# Patient Record
Sex: Male | Born: 1950
Health system: Southern US, Community
[De-identification: ages and names within clinical notes are randomized; demographics above are authoritative.]

## PROBLEM LIST (undated history)

## (undated) DIAGNOSIS — I340 Nonrheumatic mitral (valve) insufficiency: Secondary | ICD-10-CM

## (undated) DIAGNOSIS — F419 Anxiety disorder, unspecified: Secondary | ICD-10-CM

## (undated) DIAGNOSIS — R0600 Dyspnea, unspecified: Secondary | ICD-10-CM

## (undated) DIAGNOSIS — R7301 Impaired fasting glucose: Secondary | ICD-10-CM

## (undated) DIAGNOSIS — I2510 Atherosclerotic heart disease of native coronary artery without angina pectoris: Secondary | ICD-10-CM

## (undated) DIAGNOSIS — E119 Type 2 diabetes mellitus without complications: Secondary | ICD-10-CM

## (undated) DIAGNOSIS — M25532 Pain in left wrist: Secondary | ICD-10-CM

## (undated) DIAGNOSIS — G4733 Obstructive sleep apnea (adult) (pediatric): Secondary | ICD-10-CM

## (undated) DIAGNOSIS — H269 Unspecified cataract: Secondary | ICD-10-CM

## (undated) DIAGNOSIS — R011 Cardiac murmur, unspecified: Secondary | ICD-10-CM

## (undated) DIAGNOSIS — M4800 Spinal stenosis, site unspecified: Secondary | ICD-10-CM

## (undated) DIAGNOSIS — Z951 Presence of aortocoronary bypass graft: Secondary | ICD-10-CM

## (undated) DIAGNOSIS — D471 Chronic myeloproliferative disease: Secondary | ICD-10-CM

## (undated) DIAGNOSIS — G4730 Sleep apnea, unspecified: Secondary | ICD-10-CM

## (undated) DIAGNOSIS — G8929 Other chronic pain: Secondary | ICD-10-CM

## (undated) DIAGNOSIS — I10 Essential (primary) hypertension: Secondary | ICD-10-CM

## (undated) DIAGNOSIS — E785 Hyperlipidemia, unspecified: Secondary | ICD-10-CM

## (undated) DIAGNOSIS — Z9889 Other specified postprocedural states: Secondary | ICD-10-CM

## (undated) DIAGNOSIS — N189 Chronic kidney disease, unspecified: Secondary | ICD-10-CM

## (undated) DIAGNOSIS — M545 Low back pain, unspecified: Secondary | ICD-10-CM

## (undated) DIAGNOSIS — R7611 Nonspecific reaction to tuberculin skin test without active tuberculosis: Secondary | ICD-10-CM

## (undated) DIAGNOSIS — Z9989 Dependence on other enabling machines and devices: Secondary | ICD-10-CM

## (undated) DIAGNOSIS — F329 Major depressive disorder, single episode, unspecified: Secondary | ICD-10-CM

## (undated) DIAGNOSIS — K635 Polyp of colon: Secondary | ICD-10-CM

## (undated) DIAGNOSIS — K219 Gastro-esophageal reflux disease without esophagitis: Secondary | ICD-10-CM

## (undated) DIAGNOSIS — E669 Obesity, unspecified: Secondary | ICD-10-CM

## (undated) DIAGNOSIS — J189 Pneumonia, unspecified organism: Secondary | ICD-10-CM

## (undated) DIAGNOSIS — C3491 Malignant neoplasm of unspecified part of right bronchus or lung: Secondary | ICD-10-CM

## (undated) DIAGNOSIS — Z125 Encounter for screening for malignant neoplasm of prostate: Secondary | ICD-10-CM

## (undated) DIAGNOSIS — Z0000 Encounter for general adult medical examination without abnormal findings: Secondary | ICD-10-CM

## (undated) DIAGNOSIS — I739 Peripheral vascular disease, unspecified: Secondary | ICD-10-CM

## (undated) DIAGNOSIS — F3289 Other specified depressive episodes: Secondary | ICD-10-CM

## (undated) DIAGNOSIS — M1990 Unspecified osteoarthritis, unspecified site: Secondary | ICD-10-CM

## (undated) DIAGNOSIS — I5032 Chronic diastolic (congestive) heart failure: Secondary | ICD-10-CM

## (undated) HISTORY — DX: Obesity, unspecified: E66.9

## (undated) HISTORY — DX: Pain in left wrist: M25.532

## (undated) HISTORY — DX: Other specified depressive episodes: F32.89

## (undated) HISTORY — DX: Malignant neoplasm of unspecified part of right bronchus or lung: C34.91

## (undated) HISTORY — DX: Impaired fasting glucose: R73.01

## (undated) HISTORY — DX: Type 2 diabetes mellitus without complications: E11.9

## (undated) HISTORY — PX: COLONOSCOPY W/ POLYPECTOMY: SHX1380

## (undated) HISTORY — DX: Spinal stenosis, site unspecified: M48.00

## (undated) HISTORY — DX: Nonspecific reaction to tuberculin skin test without active tuberculosis: R76.11

## (undated) HISTORY — DX: Chronic kidney disease, unspecified: N18.9

## (undated) HISTORY — DX: Major depressive disorder, single episode, unspecified: F32.9

## (undated) HISTORY — DX: Chronic myeloproliferative disease: D47.1

## (undated) HISTORY — DX: Unspecified cataract: H26.9

## (undated) HISTORY — DX: Encounter for general adult medical examination without abnormal findings: Z00.00

## (undated) HISTORY — DX: Sleep apnea, unspecified: G47.30

## (undated) HISTORY — PX: TONSILLECTOMY: SUR1361

## (undated) HISTORY — DX: Polyp of colon: K63.5

## (undated) HISTORY — DX: Gastro-esophageal reflux disease without esophagitis: K21.9

## (undated) HISTORY — DX: Encounter for screening for malignant neoplasm of prostate: Z12.5

## (undated) HISTORY — DX: Hyperlipidemia, unspecified: E78.5

---
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
## 1998-05-11 HISTORY — PX: CARDIAC CATHETERIZATION: SHX172

## 1998-05-31 ENCOUNTER — Encounter: Payer: Self-pay | Admitting: Internal Medicine

## 1998-05-31 ENCOUNTER — Inpatient Hospital Stay (HOSPITAL_COMMUNITY): Admission: EM | Admit: 1998-05-31 | Discharge: 1998-06-03 | Payer: Self-pay | Admitting: Emergency Medicine

## 1998-06-03 ENCOUNTER — Encounter: Payer: Self-pay | Admitting: Internal Medicine

## 1999-02-03 ENCOUNTER — Ambulatory Visit (HOSPITAL_COMMUNITY): Admission: RE | Admit: 1999-02-03 | Discharge: 1999-02-03 | Payer: Self-pay | Admitting: Internal Medicine

## 1999-02-03 ENCOUNTER — Encounter: Payer: Self-pay | Admitting: Internal Medicine

## 2001-02-23 ENCOUNTER — Encounter: Payer: Self-pay | Admitting: Internal Medicine

## 2001-02-23 ENCOUNTER — Ambulatory Visit (HOSPITAL_COMMUNITY): Admission: RE | Admit: 2001-02-23 | Discharge: 2001-02-23 | Payer: Self-pay | Admitting: Internal Medicine

## 2002-10-27 LAB — HM COLONOSCOPY

## 2004-09-03 ENCOUNTER — Ambulatory Visit: Payer: Self-pay | Admitting: Internal Medicine

## 2005-02-24 ENCOUNTER — Ambulatory Visit: Payer: Self-pay | Admitting: Internal Medicine

## 2005-03-05 ENCOUNTER — Ambulatory Visit: Payer: Self-pay | Admitting: Internal Medicine

## 2005-04-07 ENCOUNTER — Ambulatory Visit (HOSPITAL_BASED_OUTPATIENT_CLINIC_OR_DEPARTMENT_OTHER): Admission: RE | Admit: 2005-04-07 | Discharge: 2005-04-07 | Payer: Self-pay | Admitting: Otolaryngology

## 2005-04-21 ENCOUNTER — Ambulatory Visit: Payer: Self-pay | Admitting: Internal Medicine

## 2005-05-14 ENCOUNTER — Ambulatory Visit: Payer: Self-pay | Admitting: Internal Medicine

## 2005-05-18 ENCOUNTER — Ambulatory Visit: Payer: Self-pay | Admitting: Cardiology

## 2005-08-19 ENCOUNTER — Ambulatory Visit: Payer: Self-pay

## 2005-08-19 ENCOUNTER — Ambulatory Visit: Payer: Self-pay | Admitting: Internal Medicine

## 2006-01-21 ENCOUNTER — Ambulatory Visit: Payer: Self-pay | Admitting: Internal Medicine

## 2006-05-20 ENCOUNTER — Ambulatory Visit: Payer: Self-pay | Admitting: Internal Medicine

## 2006-05-20 LAB — CONVERTED CEMR LAB
ALT: 34 units/L (ref 0–40)
AST: 24 units/L (ref 0–37)
Albumin: 3.8 g/dL (ref 3.5–5.2)
Alkaline Phosphatase: 45 units/L (ref 39–117)
BUN: 16 mg/dL (ref 6–23)
Basophils Absolute: 0 10*3/uL (ref 0.0–0.1)
Basophils Relative: 0.5 % (ref 0.0–1.0)
Bilirubin Urine: NEGATIVE
CO2: 27 meq/L (ref 19–32)
Calcium: 9.3 mg/dL (ref 8.4–10.5)
Chloride: 106 meq/L (ref 96–112)
Chol/HDL Ratio, serum: 4.9
Cholesterol: 176 mg/dL (ref 0–200)
Creatinine, Ser: 1.3 mg/dL (ref 0.4–1.5)
Eosinophil percent: 2.1 % (ref 0.0–5.0)
GFR calc non Af Amer: 60 mL/min
Glomerular Filtration Rate, Af Am: 73 mL/min/{1.73_m2}
Glucose, Bld: 121 mg/dL — ABNORMAL HIGH (ref 70–99)
HCT: 47.5 % (ref 39.0–52.0)
HDL: 36 mg/dL — ABNORMAL LOW (ref >39.0)
Hemoglobin, Urine: NEGATIVE
Hemoglobin: 15.9 g/dL (ref 13.0–17.0)
Ketones, ur: NEGATIVE mg/dL
LDL Cholesterol: 118 mg/dL — ABNORMAL HIGH (ref 0–99)
Leukocytes, UA: NEGATIVE
Lymphocytes Relative: 23.1 % (ref 12.0–46.0)
MCHC: 33.5 g/dL (ref 30.0–36.0)
MCV: 91.4 fL (ref 78.0–100.0)
Monocytes Absolute: 0.6 10*3/uL (ref 0.2–0.7)
Monocytes Relative: 10.6 % (ref 3.0–11.0)
Neutro Abs: 3.9 10*3/uL (ref 1.4–7.7)
Neutrophils Relative %: 63.7 % (ref 43.0–77.0)
Nitrite: NEGATIVE
PSA: 0.6 ng/mL (ref 0.10–4.00)
Platelets: 210 10*3/uL (ref 150–400)
Potassium: 4.5 meq/L (ref 3.5–5.1)
RBC: 5.2 M/uL (ref 4.22–5.81)
RDW: 13 % (ref 11.5–14.6)
Sodium: 140 meq/L (ref 135–145)
Specific Gravity, Urine: 1.025 (ref 1.000–1.03)
TSH: 1.85 microintl units/mL (ref 0.35–5.50)
Total Bilirubin: 0.8 mg/dL (ref 0.3–1.2)
Total Protein, Urine: NEGATIVE mg/dL
Total Protein: 6.4 g/dL (ref 6.0–8.3)
Triglyceride fasting, serum: 108 mg/dL (ref 0–149)
Urine Glucose: NEGATIVE mg/dL
Urobilinogen, UA: 0.2 (ref 0.0–1.0)
VLDL: 22 mg/dL (ref 0–40)
WBC: 6 10*3/uL (ref 4.5–10.5)
pH: 6 (ref 5.0–8.0)

## 2006-05-26 ENCOUNTER — Ambulatory Visit: Payer: Self-pay | Admitting: Internal Medicine

## 2006-09-07 ENCOUNTER — Ambulatory Visit: Payer: Self-pay | Admitting: Internal Medicine

## 2006-09-09 ENCOUNTER — Encounter: Payer: Self-pay | Admitting: Internal Medicine

## 2006-09-09 LAB — CONVERTED CEMR LAB
BUN: 20 mg/dL (ref 6–23)
CO2: 21 meq/L (ref 19–32)
Calcium: 9.4 mg/dL (ref 8.4–10.5)
Chloride: 107 meq/L (ref 96–112)
Collection Interval-CRCL: 24 hr
Creatinine 24 HR UR: 2208 mg/24hr — ABNORMAL HIGH (ref 800–2000)
Creatinine Clearance: 125 mL/min (ref 75–125)
Creatinine, Ser: 1.23 mg/dL (ref 0.40–1.50)
Creatinine, Urine: 73.6 mg/dL
Glucose, Bld: 100 mg/dL — ABNORMAL HIGH (ref 70–99)
Potassium: 4.7 meq/L (ref 3.5–5.3)
Protein, Ur: 30 mg/24hr — ABNORMAL LOW (ref 50–100)
Sodium: 143 meq/L (ref 135–145)

## 2007-01-18 ENCOUNTER — Encounter: Payer: Self-pay | Admitting: Internal Medicine

## 2007-01-18 DIAGNOSIS — K219 Gastro-esophageal reflux disease without esophagitis: Secondary | ICD-10-CM | POA: Insufficient documentation

## 2007-01-18 DIAGNOSIS — M48061 Spinal stenosis, lumbar region without neurogenic claudication: Secondary | ICD-10-CM | POA: Insufficient documentation

## 2007-01-18 DIAGNOSIS — E785 Hyperlipidemia, unspecified: Secondary | ICD-10-CM | POA: Insufficient documentation

## 2007-05-30 ENCOUNTER — Ambulatory Visit: Payer: Self-pay | Admitting: Internal Medicine

## 2007-05-30 LAB — CONVERTED CEMR LAB
ALT: 36 units/L (ref 0–53)
AST: 25 units/L (ref 0–37)
Albumin: 3.8 g/dL (ref 3.5–5.2)
Alkaline Phosphatase: 53 units/L (ref 39–117)
BUN: 22 mg/dL (ref 6–23)
Basophils Absolute: 0 10*3/uL (ref 0.0–0.1)
Basophils Relative: 0.7 % (ref 0.0–1.0)
Bilirubin Urine: NEGATIVE
Bilirubin, Direct: 0.1 mg/dL (ref 0.0–0.3)
CO2: 28 meq/L (ref 19–32)
Calcium: 8.9 mg/dL (ref 8.4–10.5)
Chloride: 104 meq/L (ref 96–112)
Cholesterol: 187 mg/dL (ref 0–200)
Creatinine, Ser: 1.5 mg/dL (ref 0.4–1.5)
Eosinophils Absolute: 0.2 10*3/uL (ref 0.0–0.6)
Eosinophils Relative: 2.4 % (ref 0.0–5.0)
GFR calc Af Amer: 62 mL/min
GFR calc non Af Amer: 51 mL/min
Glucose, Bld: 123 mg/dL — ABNORMAL HIGH (ref 70–99)
HCT: 46.9 % (ref 39.0–52.0)
HDL: 35.8 mg/dL — ABNORMAL LOW (ref >39.0)
Hemoglobin, Urine: NEGATIVE
Hemoglobin: 16.5 g/dL (ref 13.0–17.0)
Ketones, ur: NEGATIVE mg/dL
LDL Cholesterol: 132 mg/dL — ABNORMAL HIGH (ref 0–99)
Leukocytes, UA: NEGATIVE
Lymphocytes Relative: 22.7 % (ref 12.0–46.0)
MCHC: 35.1 g/dL (ref 30.0–36.0)
MCV: 90.5 fL (ref 78.0–100.0)
Monocytes Absolute: 0.6 10*3/uL (ref 0.2–0.7)
Monocytes Relative: 9.7 % (ref 3.0–11.0)
Neutro Abs: 4.1 10*3/uL (ref 1.4–7.7)
Neutrophils Relative %: 64.5 % (ref 43.0–77.0)
Nitrite: NEGATIVE
PSA: 0.82 ng/mL (ref 0.10–4.00)
Platelets: 184 10*3/uL (ref 150–400)
Potassium: 4.5 meq/L (ref 3.5–5.1)
RBC: 5.19 M/uL (ref 4.22–5.81)
RDW: 12.7 % (ref 11.5–14.6)
Sodium: 138 meq/L (ref 135–145)
Specific Gravity, Urine: 1.015 (ref 1.000–1.03)
TSH: 2.77 microintl units/mL (ref 0.35–5.50)
Total Bilirubin: 0.8 mg/dL (ref 0.3–1.2)
Total CHOL/HDL Ratio: 5.2
Total Protein, Urine: NEGATIVE mg/dL
Total Protein: 6.6 g/dL (ref 6.0–8.3)
Triglycerides: 97 mg/dL (ref 0–149)
Urine Glucose: NEGATIVE mg/dL
Urobilinogen, UA: 0.2 (ref 0.0–1.0)
VLDL: 19 mg/dL (ref 0–40)
WBC: 6.3 10*3/uL (ref 4.5–10.5)
pH: 6 (ref 5.0–8.0)

## 2007-06-08 ENCOUNTER — Ambulatory Visit: Payer: Self-pay | Admitting: Internal Medicine

## 2007-06-08 DIAGNOSIS — E669 Obesity, unspecified: Secondary | ICD-10-CM | POA: Insufficient documentation

## 2007-06-08 DIAGNOSIS — E118 Type 2 diabetes mellitus with unspecified complications: Secondary | ICD-10-CM | POA: Insufficient documentation

## 2007-06-08 DIAGNOSIS — F329 Major depressive disorder, single episode, unspecified: Secondary | ICD-10-CM | POA: Insufficient documentation

## 2007-10-06 ENCOUNTER — Ambulatory Visit: Payer: Self-pay | Admitting: Internal Medicine

## 2007-10-06 DIAGNOSIS — M549 Dorsalgia, unspecified: Secondary | ICD-10-CM | POA: Insufficient documentation

## 2008-07-04 ENCOUNTER — Ambulatory Visit: Payer: Self-pay | Admitting: Internal Medicine

## 2008-07-04 LAB — CONVERTED CEMR LAB
ALT: 35 units/L (ref 0–53)
AST: 26 units/L (ref 0–37)
Albumin: 3.8 g/dL (ref 3.5–5.2)
Alkaline Phosphatase: 47 units/L (ref 39–117)
BUN: 21 mg/dL (ref 6–23)
Basophils Absolute: 0 10*3/uL (ref 0.0–0.1)
Basophils Relative: 0.4 % (ref 0.0–3.0)
Bilirubin Urine: NEGATIVE
Bilirubin, Direct: 0.1 mg/dL (ref 0.0–0.3)
CO2: 28 meq/L (ref 19–32)
Calcium: 9.4 mg/dL (ref 8.4–10.5)
Chloride: 106 meq/L (ref 96–112)
Cholesterol: 188 mg/dL (ref 0–200)
Creatinine, Ser: 1.2 mg/dL (ref 0.4–1.5)
Eosinophils Absolute: 0.1 10*3/uL (ref 0.0–0.7)
Eosinophils Relative: 2.3 % (ref 0.0–5.0)
GFR calc Af Amer: 80 mL/min
GFR calc non Af Amer: 66 mL/min
Glucose, Bld: 114 mg/dL — ABNORMAL HIGH (ref 70–99)
HCT: 49.4 % (ref 39.0–52.0)
HDL: 35.3 mg/dL — ABNORMAL LOW (ref >39.0)
Hemoglobin, Urine: NEGATIVE
Hemoglobin: 16.6 g/dL (ref 13.0–17.0)
Ketones, ur: NEGATIVE mg/dL
LDL Cholesterol: 126 mg/dL — ABNORMAL HIGH (ref 0–99)
Leukocytes, UA: NEGATIVE
Lymphocytes Relative: 24.4 % (ref 12.0–46.0)
MCHC: 33.6 g/dL (ref 30.0–36.0)
MCV: 93.2 fL (ref 78.0–100.0)
Monocytes Absolute: 0.6 10*3/uL (ref 0.1–1.0)
Monocytes Relative: 10.5 % (ref 3.0–12.0)
Neutro Abs: 3.8 10*3/uL (ref 1.4–7.7)
Neutrophils Relative %: 62.4 % (ref 43.0–77.0)
Nitrite: NEGATIVE
PSA: 0.62 ng/mL (ref 0.10–4.00)
Platelets: 180 10*3/uL (ref 150–400)
Potassium: 4.4 meq/L (ref 3.5–5.1)
RBC: 5.3 M/uL (ref 4.22–5.81)
RDW: 12.8 % (ref 11.5–14.6)
Sodium: 142 meq/L (ref 135–145)
Specific Gravity, Urine: 1.025 (ref 1.000–1.03)
TSH: 2.18 microintl units/mL (ref 0.35–5.50)
Total Bilirubin: 1 mg/dL (ref 0.3–1.2)
Total CHOL/HDL Ratio: 5.3
Total Protein, Urine: NEGATIVE mg/dL
Total Protein: 6.5 g/dL (ref 6.0–8.3)
Triglycerides: 133 mg/dL (ref 0–149)
Urine Glucose: NEGATIVE mg/dL
Urobilinogen, UA: 0.2 (ref 0.0–1.0)
VLDL: 27 mg/dL (ref 0–40)
WBC: 5.9 10*3/uL (ref 4.5–10.5)
pH: 5.5 (ref 5.0–8.0)

## 2008-07-10 ENCOUNTER — Ambulatory Visit: Payer: Self-pay | Admitting: Internal Medicine

## 2008-07-18 ENCOUNTER — Telehealth: Payer: Self-pay | Admitting: Internal Medicine

## 2008-12-22 ENCOUNTER — Telehealth: Payer: Self-pay | Admitting: Family Medicine

## 2009-07-23 ENCOUNTER — Ambulatory Visit: Payer: Self-pay | Admitting: Internal Medicine

## 2009-07-23 LAB — CONVERTED CEMR LAB
ALT: 32 units/L (ref 0–53)
AST: 28 units/L (ref 0–37)
Albumin: 3.8 g/dL (ref 3.5–5.2)
Alkaline Phosphatase: 48 units/L (ref 39–117)
BUN: 16 mg/dL (ref 6–23)
Basophils Absolute: 0 10*3/uL (ref 0.0–0.1)
Basophils Relative: 0.2 % (ref 0.0–3.0)
Bilirubin Urine: NEGATIVE
Bilirubin, Direct: 0.2 mg/dL (ref 0.0–0.3)
CO2: 25 meq/L (ref 19–32)
Calcium: 9 mg/dL (ref 8.4–10.5)
Chloride: 109 meq/L (ref 96–112)
Cholesterol: 237 mg/dL — ABNORMAL HIGH (ref 0–200)
Creatinine, Ser: 1 mg/dL (ref 0.4–1.5)
Direct LDL: 181.5 mg/dL
Eosinophils Absolute: 0.1 10*3/uL (ref 0.0–0.7)
Eosinophils Relative: 2.9 % (ref 0.0–5.0)
GFR calc non Af Amer: 80.96 mL/min (ref >60)
Glucose, Bld: 108 mg/dL — ABNORMAL HIGH (ref 70–99)
HCT: 46.3 % (ref 39.0–52.0)
HDL: 39.4 mg/dL (ref >39.00)
Hemoglobin, Urine: NEGATIVE
Hemoglobin: 15.5 g/dL (ref 13.0–17.0)
Ketones, ur: NEGATIVE mg/dL
Leukocytes, UA: NEGATIVE
Lymphocytes Relative: 28.2 % (ref 12.0–46.0)
Lymphs Abs: 1.4 10*3/uL (ref 0.7–4.0)
MCHC: 33.4 g/dL (ref 30.0–36.0)
MCV: 93.5 fL (ref 78.0–100.0)
Monocytes Absolute: 0.5 10*3/uL (ref 0.1–1.0)
Monocytes Relative: 10.1 % (ref 3.0–12.0)
Neutro Abs: 3 10*3/uL (ref 1.4–7.7)
Neutrophils Relative %: 58.6 % (ref 43.0–77.0)
Nitrite: NEGATIVE
PSA: 0.72 ng/mL (ref 0.10–4.00)
Platelets: 163 10*3/uL (ref 150.0–400.0)
Potassium: 4.2 meq/L (ref 3.5–5.1)
RBC: 4.96 M/uL (ref 4.22–5.81)
RDW: 13.6 % (ref 11.5–14.6)
Sodium: 139 meq/L (ref 135–145)
Specific Gravity, Urine: >=1.03 (ref 1.000–1.030)
TSH: 2 microintl units/mL (ref 0.35–5.50)
Total Bilirubin: 0.7 mg/dL (ref 0.3–1.2)
Total CHOL/HDL Ratio: 6
Total Protein, Urine: NEGATIVE mg/dL
Total Protein: 6.3 g/dL (ref 6.0–8.3)
Triglycerides: 168 mg/dL — ABNORMAL HIGH (ref 0.0–149.0)
Urine Glucose: NEGATIVE mg/dL
Urobilinogen, UA: 0.2 (ref 0.0–1.0)
VLDL: 33.6 mg/dL (ref 0.0–40.0)
WBC: 5 10*3/uL (ref 4.5–10.5)
pH: 5.5 (ref 5.0–8.0)

## 2009-07-29 ENCOUNTER — Ambulatory Visit: Payer: Self-pay | Admitting: Internal Medicine

## 2009-07-29 DIAGNOSIS — M25539 Pain in unspecified wrist: Secondary | ICD-10-CM | POA: Insufficient documentation

## 2009-10-23 ENCOUNTER — Encounter: Payer: Self-pay | Admitting: Internal Medicine

## 2009-11-07 ENCOUNTER — Ambulatory Visit: Payer: Self-pay | Admitting: Internal Medicine

## 2009-11-27 ENCOUNTER — Ambulatory Visit (HOSPITAL_BASED_OUTPATIENT_CLINIC_OR_DEPARTMENT_OTHER): Admission: RE | Admit: 2009-11-27 | Discharge: 2009-11-27 | Payer: Self-pay | Admitting: Orthopedic Surgery

## 2009-12-09 HISTORY — PX: WRIST RECONSTRUCTION: SHX2675

## 2010-01-17 ENCOUNTER — Telehealth: Payer: Self-pay | Admitting: Internal Medicine

## 2010-01-17 ENCOUNTER — Ambulatory Visit: Payer: Self-pay | Admitting: Internal Medicine

## 2010-01-17 LAB — CONVERTED CEMR LAB
BUN: 16 mg/dL (ref 6–23)
Basophils Absolute: 0 10*3/uL (ref 0.0–0.1)
Basophils Relative: 0.3 % (ref 0.0–3.0)
CO2: 27 meq/L (ref 19–32)
Calcium: 9.2 mg/dL (ref 8.4–10.5)
Chloride: 106 meq/L (ref 96–112)
Creatinine, Ser: 1 mg/dL (ref 0.4–1.5)
Eosinophils Absolute: 0.1 10*3/uL (ref 0.0–0.7)
Eosinophils Relative: 1 % (ref 0.0–5.0)
GFR calc non Af Amer: 77.25 mL/min (ref >60)
Glucose, Bld: 95 mg/dL (ref 70–99)
HCT: 45.5 % (ref 39.0–52.0)
Hemoglobin: 15.8 g/dL (ref 13.0–17.0)
Lymphocytes Relative: 13.1 % (ref 12.0–46.0)
Lymphs Abs: 1.3 10*3/uL (ref 0.7–4.0)
MCHC: 34.6 g/dL (ref 30.0–36.0)
MCV: 92.3 fL (ref 78.0–100.0)
Monocytes Absolute: 1.1 10*3/uL — ABNORMAL HIGH (ref 0.1–1.0)
Monocytes Relative: 11 % (ref 3.0–12.0)
Neutro Abs: 7.5 10*3/uL (ref 1.4–7.7)
Neutrophils Relative %: 74.6 % (ref 43.0–77.0)
Platelets: 232 10*3/uL (ref 150.0–400.0)
Potassium: 5.2 meq/L — ABNORMAL HIGH (ref 3.5–5.1)
RBC: 4.93 M/uL (ref 4.22–5.81)
RDW: 13.7 % (ref 11.5–14.6)
Sodium: 141 meq/L (ref 135–145)
WBC: 10 10*3/uL (ref 4.5–10.5)

## 2010-01-23 ENCOUNTER — Ambulatory Visit: Payer: Self-pay | Admitting: Internal Medicine

## 2010-01-29 ENCOUNTER — Ambulatory Visit: Payer: Self-pay | Admitting: Cardiology

## 2010-02-03 ENCOUNTER — Encounter (INDEPENDENT_AMBULATORY_CARE_PROVIDER_SITE_OTHER): Payer: Self-pay | Admitting: *Deleted

## 2010-02-26 ENCOUNTER — Ambulatory Visit: Payer: Self-pay | Admitting: Internal Medicine

## 2010-02-26 DIAGNOSIS — J984 Other disorders of lung: Secondary | ICD-10-CM | POA: Insufficient documentation

## 2010-06-04 ENCOUNTER — Telehealth: Payer: Self-pay | Admitting: Internal Medicine

## 2010-06-08 LAB — CONVERTED CEMR LAB
ALT: 32 units/L (ref 0–53)
AST: 25 units/L (ref 0–37)
Albumin: 4.5 g/dL (ref 3.5–5.2)
Alkaline Phosphatase: 51 units/L (ref 39–117)
Bilirubin, Direct: 0.1 mg/dL (ref 0.0–0.3)
Cholesterol: 209 mg/dL — ABNORMAL HIGH (ref 0–200)
Direct LDL: 134.1 mg/dL
HDL: 42.2 mg/dL (ref >39.00)
Total Bilirubin: 0.7 mg/dL (ref 0.3–1.2)
Total CHOL/HDL Ratio: 5
Total Protein: 7.4 g/dL (ref 6.0–8.3)
Triglycerides: 184 mg/dL — ABNORMAL HIGH (ref 0.0–149.0)
VLDL: 36.8 mg/dL (ref 0.0–40.0)

## 2010-06-10 NOTE — Progress Notes (Signed)
Summary: ERROR  Phone Note Call from Patient   Summary of Call: Pt recieved letter but letter has labs from pt's wife not him.  Initial call taken by: James [name],  July 18, 2008 2:01 PM  Follow-up for Phone Call        aren't all James [name]'s created equal. OK to print and mail a full copy of his labs.  Follow-up by: Jacques Navy MD,  July 18, 2008 6:22 PM  Additional Follow-up for Phone Call Additional follow up Details #1::        Spoke w/pt, labs mailed Additional Follow-up by: James [name],  July 19, 2008 6:26 PM

## 2010-06-10 NOTE — Assessment & Plan Note (Signed)
Summary: LEFT LYMPH NODES SORE-EARACHE-100% FEVER X MON BACK MUSCLE SP...   Vital Signs:  Patient profile:   60 year old male Height:      67 inches Weight:      231 pounds BMI:     36.31 O2 Sat:      95 % Temp:     99.1 degrees F oral Pulse rate:   82 / minute BP sitting:   126 / 90  (left arm) Cuff size:   regular  Vitals Entered By: Alysia Penna (January 17, 2010 9:03 AM) CC: pt has had back spasms beginning sunday night, early monday morning. has had a temperture around 100degrees since tuesday. ear soreness. hurts to take a deep breath. /cp sma   Primary Care Provider:  Allie Gerhold  CC:  pt has had back spasms beginning sunday night and early monday morning. has had a temperture around 100degrees since tuesday. ear soreness. hurts to take a deep breath. /cp sma.  History of Present Illness: Patient presents with a 6 day illness. He has been having increased muscle spasms back and this spread to involve the left arm and face. He has had a persistent temperature of 100 degrees which does respond to APAP but will recur. He has felt bad but has not had specific symptoms: no sore throat, no cough, no ear pain. He had felt a tender submandibular lymph gland left and thinks he has a sore lymph node in the left axilla. He denies Nausea or vomiting but has had anorexia and early satiety. He did have some diarrhea intially but is now constipated.  He recently had reconstruction of the left wrist by Dr. Merlyn Lot. He developed two trigger fingers left hand during rehab that has required injection therapy. He continues to have decreased ROM of the left wrist and continues to have pain. His rehab has been delayed by the above illness.   Current Medications (verified): 1)  Pepcid Ac 10 Mg Chew (Famotidine) .Marland Kitchen.. 1 By Mouth Prn 2)  Eql Fish Oil 1000 Mg Caps (Omega-3 Fatty Acids) .... Daily 3)  Lipitor 10 Mg Tabs (Atorvastatin Calcium) .Marland Kitchen.. 1 By Mouth Once Daily  Allergies (verified): 1)  !  Amoxicillin  Past History:  Past Medical History: Last updated: 07/29/2009 WRIST PAIN, LEFT, CHRONIC (ICD-719.43) BACK PAIN (ICD-724.5) DEPRESSIVE DISORDER NOT ELSEWHERE CLASSIFIED (ICD-311) OBESITY, UNSPECIFIED (ICD-278.00) HYPERGLYCEMIA, FASTING (ICD-790.29) SPECIAL SCREENING MALIGNANT NEOPLASM OF PROSTATE (ICD-V76.44) ROUTINE GENERAL MEDICAL EXAM@HEALTH  CARE FACL (ICD-V70.0) SPINAL STENOSIS (ICD-724.00) GERD (ICD-530.81) HYPERLIPIDEMIA (ICD-272.4)        Past Surgical History: Tonsillectomy ACL repair left knee at 15 reconstruction of the left wrist Aug '11 [facility])  Family History: Reviewed history from 06/08/2007 and no changes required. father- deceased @ 49; fatal MI Paternal uncle - deceased @ 45's; fatal MI mother - deceased @84 ; complications of PVD with intestinal angina Neg- colon, prostate cancer; DM, HTN, Lipids  Social History: Reviewed history from 07/10/2008 and no changes required. 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 He is not in acute distress Head:  normocephalic, atraumatic, and no abnormalities observed.   Eyes:  pupils equal, pupils round, corneas and lenses clear, and no injection.   Ears:  EACs and TMs normal Mouth:  no oral lesions. Posterior pharynx is clear Neck:  supple, full ROM, no masses, and  no thyromegaly.   Chest Wall:  no deformities and no masses.   Lungs:  normal respiratory effort, normal breath sounds, no crackles, and no wheezes.   Heart:  normal rate, regular rhythm, no murmur, and no gallop.   Abdomen:  protruberant, BS +, no tenderness Msk:  surgical scar distal left UE at the radial aspect. Decreased flexion and extension of the left wrist. Trigger finger with decreased flexion 3rd and 4th digit left. Pulses:  2+  radial Extremities:  no edema Neurologic:  normal sensation to light touch bilateal UE forearms and hands. decreased strength left handgrip, "O" sign. Skin:  flushed, no lesions. Well healed scar left wrist Cervical Nodes:  enlarged and tender left submandibular node. Axillary Nodes:  no left axillary node. Tender in the medial proximal Left UE - along the path of the brachial plexus. Psych:  Oriented X3, memory intact for recent and remote, and normally interactive.     Impression & Recommendations:  Problem # 1:  ACUTE LYMPHADENITIS (ICD-683) no identified source of infection to explain enlarged and tender lymph node and fever. No focal neurologic signs to suggest myelitis. No wound infection. HEENT normal  Plan - CBC           cephalexin 500mg  qid x 7 unless marked leukocytosis in which case will use levaquin.           watch for any sign of progressive infection.  His updated medication list for this problem includes:    Cephalexin 500 Mg Caps (Cephalexin) .Marland Kitchen... 1 by mouth qid x 7 for lymphadinitis  Orders: TLB-CBC Platelet - w/Differential (85025-CBCD) TLB-BMP (Basic Metabolic Panel-BMET) (80048-METABOL)  Problem # 2:  BACK PAIN (ICD-724.5) tender in the left lower back. No radicular symptoms  Plan - continue with cyclobenzaprine 5-10mg  q 8  His updated medication list for this problem includes:    Cyclobenzaprine Hcl 5 Mg Tabs (Cyclobenzaprine hcl) .Marland Kitchen... 1 or 2 tabs q8 as needed muscle spasm  Complete Medication List: 1)  Pepcid Ac 10 Mg Chew (Famotidine) .Marland Kitchen.. 1 by mouth prn 2)  Eql Fish Oil 1000 Mg Caps (Omega-3 fatty acids) .... Daily 3)  Lipitor 10 Mg Tabs (Atorvastatin calcium) .Marland Kitchen.. 1 by mouth once daily 4)  Cephalexin 500 Mg Caps (Cephalexin) .Marland Kitchen.. 1 by mouth qid x 7 for lymphadinitis 5)  Cyclobenzaprine Hcl 5 Mg Tabs (Cyclobenzaprine hcl) .Marland Kitchen.. 1 or 2 tabs q8 as needed muscle spasm Prescriptions: CYCLOBENZAPRINE HCL 5 MG TABS (CYCLOBENZAPRINE HCL) 1 or 2 tabs q8 as  needed muscle spasm  #60 x 1   Entered and Authorized by:   Jacques Navy MD   Signed by:   Jacques Navy MD on 01/17/2010   Method used:   Electronically to        [facility]  [address] [id number] (retail)       [address]       [place], [place]  [address]       Ph: [phone] or [phone]       Fax: [phone]   RxID:   [id number] CEPHALEXIN 500 MG CAPS (CEPHALEXIN) 1 by mouth qid x 7 for lymphadinitis  #28 x 0   Entered and Authorized by:   Jacques Navy MD   Signed by:   Jacques Navy MD on 01/17/2010   Method used:   [facility]ically to        [facility]  [address] [phone] (retail)  [address]       [place], [place]  [address]       Ph: [phone] or [phone]       Fax: [phone]   RxID:   [id number]

## 2010-06-10 NOTE — Assessment & Plan Note (Signed)
Summary: Pulmonary consultation/ MPN/ hemoptysis   Visit Type:  Initial Consult Copy to:  [facility] Primary Provider/Referring Provider:  [facility]  CC:  Hemoptysis.  History of Present Illness: 23 yowm never smoker with ? EIA but never needed an inhaler and recurrent pattern of cough on exposure to grass if dry and dusty.  February 26, 2010  1st pulmonary office eval cc cough x after Labor day when moweed the grass when dry and dusty with cough immediately then 3 days later fever then saw [facility] 9/9 rx abx then 3 days after that started noticing traces of brb no epistaxis > CT with MPN's.  Previous CT 2002 also with MPNs but not available (informed it was recycled).  Presently pt feels fine x hoarseness with voice use and mild HB.  Pt denies any significant sore throat, dysphagia, itching, sneezing,  nasal congestion or excess secretions,  fever, chills, sweats, unintended wt loss, pleuritic or exertional cp,  recurrent hempoptysis, change in activity tolerance  orthopnea pnd or leg swelling. Pt also denies any obvious fluctuation in symptoms with weather or environmental change or other alleviating or aggravating factors.       Current Medications (verified): 1)  Pepcid Ac 10 Mg Chew (Famotidine) .Marland Kitchen.. 1 Once Daily As Needed 2)  Lipitor 10 Mg Tabs (Atorvastatin Calcium) .Marland Kitchen.. 1 By Mouth Once Daily 3)  Cyclobenzaprine Hcl 5 Mg Tabs (Cyclobenzaprine Hcl) .Marland Kitchen.. 1 To 2 Every 8 Hrs As Needed 4)  Centrum Silver  Tabs (Multiple Vitamins-Minerals) .Marland Kitchen.. 1 Once Daily  Allergies (verified): 1)  ! Amoxicillin  Past History:  Past Medical History: WRIST PAIN, LEFT, CHRONIC (ICD-719.43) BACK PAIN (ICD-724.5) DEPRESSIVE DISORDER NOT ELSEWHERE CLASSIFIED (ICD-311) OBESITY, UNSPECIFIED (ICD-278.00) HYPERGLYCEMIA, FASTING (ICD-790.29) SPECIAL SCREENING MALIGNANT NEOPLASM OF PROSTATE (ICD-V76.44) ROUTINE GENERAL MEDICAL EXAM@HEALTH  CARE FACL (ICD-V70.0) SPINAL STENOSIS (ICD-724.00) GERD (ICD-530.81) Colon  Polyps     - 10/27/2002 , repeat rec by letter 09/17/2007 HYPERLIPIDEMIA (ICD-272.4) MPN      - 1st detected 06/04/98 Hemoptysis with abn CT Chest  01/29/10     - ? new GG changes RUL > not viz on plain cxr February 26, 2010  H/O Pos PPD per pt        Family History: Reviewed history from 06/08/2007 and no changes required. 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  The patient denies shortness of breath with activity, shortness of breath at rest, productive cough, non-productive cough, chest pain, irregular heartbeats, indigestion, loss of appetite, abdominal pain, difficulty swallowing, tooth/dental problems, headaches, nasal congestion/difficulty breathing through nose, itching, ear ache, anxiety, hand/feet swelling, rash, change in color of mucus, and fever.    Vital Signs:  Patient profile:   60 year old male Weight:      236.25 pounds BMI:     37.14 O2 Sat:      94 % on Room air Temp:     98.8 degrees F oral Pulse rate:   85 / minute BP sitting:   134 / 76  (left arm)  Vitals Entered By: Vernie Murders (February 26, 2010 10:42 AM)  O2 Flow:  Room air  Physical Exam  Additional Exam:  obese slt anxious hoarse wm with classic voice fatigue wt 235 > 235 February 26, 2010  pseudowheeze resolves with purse lip maneuver  HEENT: nl dentition, turbinates, and orophanx. Nl external ear canals without cough reflex NECK :  without JVD/Nodes/TM/ nl carotid upstrokes  bilaterally LUNGS: no acc muscle use, clear to A and P bilaterally without cough on insp or exp maneuvers CV:  RRR  no s3 or murmur or increase in P2, no edema  ABD:  soft and nontender with nl excursion in the supine position. No bruits or organomegaly, bowel sounds nl MS:  warm without deformities, calf tenderness, cyanosis or clubbing SKIN: warm and dry without lesions   NEURO:  alert, approp, no deficits     Impression & Recommendations:  Problem # 1:  PULMONARY NODULE (ICD-518.89) MPN's dating back to 2000 with no old xrays or CT scans on file so ideally needs another CT in 6 months.  Pos IPPD never treated so possible this may be related but abrupt onset of symptoms strongly against TB in any acute lung changes. Never smoker so very unlikely neoplastic  Discussed in detail all the  indications, usual  risks and alternatives  relative to the benefits with patient who agrees to proceed with conservative f/u.  See instructions for specific recommendations   Placed in tickle file for recall  Problem # 2:  GERD (ICD-530.81)  His updated medication list for this problem includes:    Pepcid Ac 10 Mg Chew (Famotidine) .Marland Kitchen... 1 once daily as needed  Classic voice fatigue and pseudowheeze resolves with purse lip maneuver suggests GERD/ LPR  See instructions for specific recommendations > if not effective for hoarseness needs PPI trial next  Orders: Consultation Level V (09811)  Medications Added to Medication List This Visit: 1)  Pepcid Ac 10 Mg Chew (Famotidine) .Marland Kitchen.. 1 once daily as needed 2)  Cyclobenzaprine Hcl 5 Mg Tabs (Cyclobenzaprine hcl) .Marland Kitchen.. 1 to 2 every 8 hrs as needed 3)  Centrum Silver Tabs (Multiple vitamins-minerals) .Marland Kitchen.. 1 once daily  Other Orders: T-2 View CXR (71020TC)  Patient Instructions: 1)  GERD (REFLUX)  is a common cause of respiratory symptoms. It commonly presents without heartburn and can be treated with medication, but also with lifestyle changes  including avoidance of late meals, excessive alcohol, smoking cessation, and avoid fatty foods, chocolate, peppermint, colas, red wine, and acidic juices such as orange juice. NO MINT OR MENTHOL PRODUCTS SO NO COUGH DROPS  2)  USE SUGARLESS CANDY INSTEAD (jolley ranchers)  3)  NO OIL BASED VITAMINS  4)  Pepcid 20 mg after breakfast and at bedtime to see if it helps your voice 5)  Will recommend CT chest in 6 months (tickle file) and a routine cxr today unless you have symptoms (persistent cough, fever ? etiology or difficulty breathing)

## 2010-06-10 NOTE — Assessment & Plan Note (Signed)
Summary: COUGHING UP BLOOD/ FEVER/ NWS   Vital Signs:  Patient profile:   60 year old male Height:      67 inches Weight:      235 pounds BMI:     36.94 O2 Sat:      96 % on Room air Temp:     98.6 degrees F oral Pulse rate:   85 / minute BP sitting:   144 / 82  (left arm) Cuff size:   regular  Vitals Entered By: Bill Salinas CMA (January 23, 2010 10:20 AM)  O2 Flow:  Room air CC: pt here with c/o coughing up bright red blood x 2 days, he also mentioned that he has had 2 episodes of night sweats/ ab   Primary Care Provider:  Boston Catarino  CC:  pt here with c/o coughing up bright red blood x 2 days and he also mentioned that he has had 2 episodes of night sweats/ ab.  History of Present Illness: James [name] presents due to hemoptysis. He has been recovering from lymphadenitis and has completed antibiotics. He did have an episode of a mild cough that was productive of red blood. He had a second episode of a brief cough with red blood and clot. He has had no SOB, no night sweats, no fevers,  no weight loss. He is not a smoker. He has a h/o +PPD and on previous imaging studies had a small basal nodule thought to represent old scar.  Current Medications (verified): 1)  Pepcid Ac 10 Mg Chew (Famotidine) .Marland Kitchen.. 1 By Mouth Prn 2)  Eql Fish Oil 1000 Mg Caps (Omega-3 Fatty Acids) .... Daily 3)  Lipitor 10 Mg Tabs (Atorvastatin Calcium) .Marland Kitchen.. 1 By Mouth Once Daily 4)  Cephalexin 500 Mg Caps (Cephalexin) .Marland Kitchen.. 1 By Mouth Qid X 7 For Lymphadinitis 5)  Cyclobenzaprine Hcl 5 Mg Tabs (Cyclobenzaprine Hcl) .Marland Kitchen.. 1 or 2 Tabs Q8 As Needed Muscle Spasm  Allergies (verified): 1)  ! Amoxicillin  Past History:  Past Medical History: Last updated: 07/29/2009 WRIST PAIN, LEFT, CHRONIC (ICD-719.43) BACK PAIN (ICD-724.5) DEPRESSIVE DISORDER NOT ELSEWHERE CLASSIFIED (ICD-311) OBESITY, UNSPECIFIED (ICD-278.00) HYPERGLYCEMIA, FASTING (ICD-790.29) SPECIAL SCREENING MALIGNANT NEOPLASM OF PROSTATE  (ICD-V76.44) ROUTINE GENERAL MEDICAL EXAM@HEALTH  CARE FACL (ICD-V70.0) SPINAL STENOSIS (ICD-724.00) GERD (ICD-530.81) HYPERLIPIDEMIA (ICD-272.4)        Past Surgical History: Last updated: 01/17/2010 Tonsillectomy ACL repair left knee at 15 reconstruction of the left wrist Aug '11 [facility]) PSH reviewed for relevance, FH reviewed for relevance  Review of Systems       The patient complains of hemoptysis.  The patient denies anorexia, fever, weight loss, weight gain, vision loss, hoarseness, chest pain, syncope, dyspnea on exertion, prolonged cough, abdominal pain, melena, muscle weakness, difficulty walking, unusual weight change, and enlarged lymph nodes.    Physical Exam  General:  overweight white male in no distress Head:  normocephalic and atraumatic.   Eyes:  C&S clear Neck:  supple.  some residual tenderness left submandibular region Chest Wall:  no deformities and no tenderness.   Lungs:  normal respiratory effort, no intercostal retractions, no accessory muscle use, normal breath sounds, no crackles, and no wheezes.   Heart:  normal rate and regular rhythm.   Msk:  left wrist with decreased swelling, improved ROM. Still with trigger 3rd digit left hand Neurologic:  alert & oriented X3, cranial nerves II-XII intact, and gait normal.   Skin:  turgor normal and color normal.   Cervical Nodes:  no anterior cervical adenopathy  and no posterior cervical adenopathy.   Psych:  Oriented X3, normally interactive, good eye contact, and not anxious appearing.     Impression & Recommendations:  Problem # 1:  HEMOPTYSIS UNSPECIFIED (ICD-786.30) Patient with hempotysis. No clinical symptoms to suggest malignany. Remote smoker. Also remote exposure to TB.  Plan - CT chest.   Orders: Radiology Referral (Radiology)  Complete Medication List: 1)  Pepcid Ac 10 Mg Chew (Famotidine) .Marland Kitchen.. 1 by mouth prn 2)  Eql Fish Oil 1000 Mg Caps (Omega-3 fatty acids) .... Daily 3)  Lipitor 10  Mg Tabs (Atorvastatin calcium) .Marland Kitchen.. 1 by mouth once daily 4)  Cyclobenzaprine Hcl 5 Mg Tabs (Cyclobenzaprine hcl) .Marland Kitchen.. 1 or 2 tabs q8 as needed muscle spasm

## 2010-06-10 NOTE — Assessment & Plan Note (Signed)
Summary: PER WIFE CPX--$50---STC   Vital Signs:  Patient Profile:   59 Years Old Male Height:     67 inches Weight:      233 pounds BMI:     36.62 Temp:     97.8 degrees F oral Pulse rate:   66 / minute BP sitting:   120 / 78  (left arm) Cuff size:   large  Vitals Entered By: Zackery Barefoot CMA (July 10, 2008 2:39 PM)                 PCP:  Kristy Schomburg  Chief Complaint:  CPX.  History of Present Illness: Patient is feeling ok but has had significant weight over the past two months. BP at home has been reasonably well controlled.  Feeling for the most part OK.   Muscle pain: assoicated with lipitor but did not go completely away with cessation. Now on simvastatin for the past 17 days and willing to give it a chance.  Reflux patient went cold [place] off of nexium and had classic rebound with terrible pain for 2 weeks. He is now better but having recurrent nocturnal reflux for which he is taking otc pepcid with good results. Discussed the long term efficacy and cost effectiveness of H2 blockers.  Derm: patient is having facial rash for two months, exacerbated by shaving. He has been uising otc cortisone 10. NO pain but he does have some facial swelling.    Prior Medications Reviewed Using: Patient Recall  Updated Prior Medication List: SIMVASTATIN 20 MG  TABS (SIMVASTATIN) 1 by mouth qPM PEPCID AC 10 MG CHEW (FAMOTIDINE) 1 by mouth prn EQL FISH OIL 1000 MG CAPS (OMEGA-3 FATTY ACIDS) daily  Current Allergies (reviewed today): ! AMOXICILLIN  Past Medical History:    Reviewed history from 01/18/2007 and no changes required:       BACK PAIN (ICD-724.5)       DEPRESSIVE DISORDER NOT ELSEWHERE CLASSIFIED (ICD-311)       OBESITY, UNSPECIFIED (ICD-278.00)       HYPERGLYCEMIA, FASTING (ICD-790.29)       SPECIAL SCREENING MALIGNANT NEOPLASM OF PROSTATE (ICD-V76.44)       ROUTINE GENERAL MEDICAL EXAM@HEALTH  CARE FACL (ICD-V70.0)       SPINAL STENOSIS (ICD-724.00)       GERD  (ICD-530.81)       HYPERLIPIDEMIA (ICD-272.4)                        Past Surgical History:    Reviewed history from 06/08/2007 and no changes required:       Tonsillectomy       ACL repair left knee at 15   Family History:    Reviewed history from 06/08/2007 and no changes required:       father- deceased @ 6; fatal MI       Paternal uncle - deceased @ 90's; fatal MI       mother - deceased @84 ; complications of PVD with intestinal angina       Neg- colon, prostate cancer; DM, HTN, Lipids  Social History:    Reviewed history from 06/08/2007 and no changes required:       [employer] BS       married '73       2 sons - '74, '76; 1 daughter '77; grandchildren 4       work: Barista and coaching- fully retired; Biomedical engineer   Risk Factors: Tobacco use:  never Caffeine use:  2 drinks per day Alcohol use:  yes    Type:  beer    Drinks per day:  <1 Exercise:  yes    Times per week:  5    Type:  nordic track 30 min; interval training Seatbelt use:  100 %  Colonoscopy History:    Date of Last Colonoscopy:  10/27/2002   Review of Systems  The patient denies anorexia, fever, weight loss, weight gain, vision loss, decreased hearing, chest pain, syncope, prolonged cough, hemoptysis, abdominal pain, severe indigestion/heartburn, muscle weakness, transient blindness, abnormal bleeding, and angioedema.     Physical Exam  General:     Heavy set white male in no distress Head:     Normocephalic and atraumatic without obvious abnormalities. No apparent alopecia or balding. Eyes:     No corneal or conjunctival inflammation noted. EOMI. Perrla. Funduscopic exam benign, without hemorrhages, exudates or papilledema. Vision grossly normal. Ears:     External ear exam shows no significant lesions or deformities.  Otoscopic examination reveals clear canals, tympanic membranes are intact bilaterally without bulging, retraction, inflammation or discharge. Hearing  is grossly normal bilaterally. Nose:     no external deformity and no external erythema.   Mouth:     teeth in good repair. Small bite granuloma on the left buccal membrane Neck:     supple, full ROM, no thyromegaly, and no carotid bruits.   Chest Wall:     No deformities, masses, tenderness or gynecomastia noted. Lungs:     Normal respiratory effort, chest expands symmetrically. Lungs are clear to auscultation, no crackles or wheezes. Heart:     Normal rate and regular rhythm. S1 and S2 normal without gallop, murmur, click, rub or other extra sounds. Abdomen:     Protruberant, BS+ x 4, without hepato-splenomegaly, small soft umbilical hernia Rectal:     No external abnormalities noted. Normal sphincter tone. No rectal masses or tenderness. Genitalia:     no hydrocele, no varicocele, no scrotal masses, and no testicular masses or atrophy.   Prostate:     Prostate gland firm and smooth, no enlargement, nodularity, tenderness, mass, asymmetry or induration. Msk:     normal ROM, no joint tenderness, no joint warmth, no redness over joints, and no joint instability.   Pulses:     2+ radial and Posterior tibial Extremities:     No clubbing, cyanosis, edema, or deformity noted with normal full range of motion of all joints.   Neurologic:     alert & oriented X3, cranial nerves II-XII intact, strength normal in all extremities, gait normal, and DTRs symmetrical and normal.   Skin:     turgor normal, color normal, no rashes, and no suspicious lesions.   Cervical Nodes:     no anterior cervical adenopathy and no posterior cervical adenopathy.   Psych:     Oriented X3, memory intact for recent and remote, normally interactive, good eye contact, and not anxious appearing.      Impression & Recommendations:  Problem # 1:  BACK PAIN (ICD-724.5) chronic back pain which flares from time to time.  Plan: continue muscle relaxant as needed          wight management to reduce strain  The  following medications were removed from the medication list:    Aspirin 325 Mg Tabs (Aspirin) .Marland Kitchen... Take one tablet daily    Cyclobenzaprine Hcl 5 Mg Tabs (Cyclobenzaprine hcl) .Marland Kitchen... 1 by mouth three times a day.  Problem # 2:  DEPRESSIVE DISORDER NOT ELSEWHERE CLASSIFIED (ICD-311) Doing well. He reports occasional lows, usually associated with confrontation and suppressed emotions.  Plan: recommended "hose and phonebook" therapy to allow for dissipation of suppressed anger/frustration  Problem # 3:  OBESITY, UNSPECIFIED (ICD-278.00) Patient aware of recent weight gain and also of the advantages to keeping his weight under control. He definitely remembers the "meal of a 1000 chews."  Plan: weight management with a target of 200lbs, goal of loosing 1 lb/month  Problem # 4:  HYPERGLYCEMIA, FASTING (ICD-790.29)  Labs Reviewed: Creat: 1.2 (07/04/2008)   glucose 114 down from 126.  Patient has brought serum glucose down into normal range with diet management.  Plan: contnued diet vigilence.   Problem # 5:  HYPERLIPIDEMIA (ICD-272.4)  His updated medication list for this problem includes:    Simvastatin 20 Mg Tabs (Simvastatin) .Marland Kitchen... 1 by mouth qpm  Labs Reviewed: Chol: 188 (07/04/2008)   HDL: 35.3 (07/04/2008)   LDL: 126 (07/04/2008)   TG: 133 (07/04/2008) SGOT: 26 (07/04/2008)   SGPT: 35 (07/04/2008)  LDL is below NCEP goal. He has been on and off lipitor due to possible myalgias. Currently taking simvastatin and tolerating it well.  Plan: continue simvastatin and diet managment.   Problem # 6:  GERD (ICD-530.81) Stopped PPI therapy with rebound pain as noted in HPI. Currently using H2 blocker with good success. He is reassured that it is safe to take H2 blockers regularly and long term.  Plan: continue otc Pepcid AC chews.  The following medications were removed from the medication list:    Prilosec 40 Mg Cpdr (Omeprazole) .Marland Kitchen... 1 by mouth q am  His updated medication  list for this problem includes:    Pepcid Ac 10 Mg Chew (Famotidine) .Marland Kitchen... 1 by mouth prn   Problem # 7:  Preventive Health Care (ICD-V70.0) history and exam are OK. He reports that his blood pressure is generally controlled at home -will need to calibrate home monitor against office syphgmomanometer. He is provided tetnus booster at today's visit.  In summary: a very nice gentleman who will work on his weight and continue his present medical regimen. He will return in 1 year or as needed.  Complete Medication List: 1)  Simvastatin 20 Mg Tabs (Simvastatin) .Marland Kitchen.. 1 by mouth qpm 2)  Pepcid Ac 10 Mg Chew (Famotidine) .Marland Kitchen.. 1 by mouth prn 3)  Eql Fish Oil 1000 Mg Caps (Omega-3 fatty acids) .... Daily  Other Orders: Tdap => 76yrs IM (85462) Admin 1st Vaccine (70350)   Patient: James [name] Note: All result statuses are Final unless otherwise noted.  Tests: (1) LIPID PROFILE (LIPID)   CHOLESTEROL          [HH] 232 mg/dL                   0-938     REFLEX TESTING FOR LDLD       ATP III Classification:             < 200       mg/dL      Desireable            200 - 239    mg/dL      Borderline High            > = 240      mg/dL      High   TRIGLYCERIDES             83 mg/dL  0-149        Normal:  < 150 mg/dL        Borderline High:  150 - 199 mg/dL   HDL                       14.7 mg/dL                  >82.9   VLDL CHOLESTEROL          17 mg/dl                    5-62   LDL CHOLESTEROL           DEL (D)  CHOL/HDL Ratio: CHD Risk                             5.6 CALC  Tests: (2) URINE DIPTICK (UDIP)   COLOR                     LT YELLOW                   YELLOW   CLARITY                   Clear                       CLEAR   SP.GRAVITY                1.020                       1.000-1.03   URINE pH                  6.0                         5.0 - 8.0   PROTEIN                   Negative                    NEGATIVE   URINE GLUCOSE             NEGATIVE                     NEGATIVE   KETONES                   NEGATIVE                    NEGATIVE   URINE BILIRUBIN           NEGATIVE                    NEGATIVE   BLOOD                     NEGATIVE                    NEGATIVE   UROBILINOGEN              0.2 mg/dL                   1.3-0.8   LEUKOCYTE ESTERACE        Negative  NEGATIVE   NITRITE                   Negative                    NEGATIVE  Tests: (3) CBC WITH DIFF (CBCD)   WHITE CELL COUNT          7.0 K/uL                    4.5-10.5   RED CELL COUNT            4.61 Mil/uL                 3.87-5.11   HEMOGLOBIN                14.1 g/dL                   16.1-09.6   HEMATOCRIT                41.4 %                      36.0-46.0   MCV                       89.8 fl                     78.0-100.0   MCHC                      34.2 g/dL                   04.5-40.9   RDW                       11.5 %                      11.5-14.6   PLATELET COUNT            290 K/uL                    150-400   NEUTROPHIL %              51.3 %                      43.0-77.0   LYMPHOCYTE %              38.1 %                      12.0-46.0   MONOCYTE %                7.2 %                       3.0-12.0   EOSINOPHILS %             3.3 %                       0.0-5.0   BASOPHILS %               0.1 %                       0.0-3.0  NEUTROPHILS, ABSOLUTE  3.6 K/uL                    1.4-7.7   MONOCYTES, ABSOLUTE       0.5 K/uL                    0.1-1.0  EOSINOPHILS, ABSOLUTE                             0.2 K/uL                    0.0-0.7   BASOPHILS, ABSOLUTE       0.0 K/uL                    0.0-0.1  Tests: (4) BASIC METABOLIC PANEL (METABOL)   SODIUM                    143 mEq/L                   135-145   POTASSIUM                 4.1 mEq/L                   3.5-5.1   CHLORIDE                  107 mEq/L                   96-112   CARBON DIOXIDE            30 mEq/L                    19-32   GLUCOSE              [H]   105 mg/dL                   64-33   BUN                       14 mg/dL                    2-95   CREATININE                0.7 mg/dL                   1.8-8.4   CALCIUM                   9.6 mg/dL                   1.6-60.6  GFR (AFRICAN AMERICAN)                             112 mL/min  GFR (NON-AFRICAN AMERICAN)                             92 mL/min  Tests: (5) HEPATIC FUNCTION PANEL (HEPATIC)   TOTAL BILIRUBIN           0.7 mg/dL                   3.0-1.6   DIRECT BILIRUBIN          0.1 mg/dL  0.0-0.3   ALKALINE PHOSPHATASE      49 U/L                      39-117   SGOT (AST)                25 U/L                      0-37   SGPT (ALT)                28 U/L                      0-35   TOTAL PROTEIN             7.3 g/dL                    1.9-1.4   ALBUMIN                   4.0 g/dL                    7.8-2.9  Tests: (6) CHOLESTEROL, LDL-DIRECT (DIRLDL)  CHOLESTEROL, LDL-DIRECT                             156.3 mg/dL       ATP III Classification:             < 100      mg/dL     Optimal             100 - 129  mg/dL     Near or above optimal             130 - 159  mg/dL     Borderline High             160 - 189  mg/dL     High              > 190     mg/dL     Very High  Tests: (7) FastTSH (TSH)   FastTSH                   1.27 uIU/mL                 0.35-5.50 Prescriptions: SIMVASTATIN 20 MG  TABS (SIMVASTATIN) 1 by mouth qPM  #30 x 12   Entered and Authorized by:   Jacques Navy MD   Signed by:   Jacques Navy MD on 07/10/2008   Method used:   Electronically to        [facility]  [address] [id number] ([facility]ail)       [address]       [place], [place]  [address]       Ph: [phone] or [phone]       Fax: [phone]   RxID:   [id number]    Tetanus/Td Vaccine    Vaccine Type: Tdap    Site: left deltoid    Mfr: GlaxoSmithKline    Dose: 0.5 ml    Route: IM    Given by: Zackery Barefoot CMA    Exp. Date: 04/14/2010     Lot #: [id number]

## 2010-06-10 NOTE — Letter (Signed)
Summary: DME/[facility]  DME/[facility]   Imported By: Lester Georgetown 10/25/2009 07:43:26  _____________________________________________________________________  External Attachment:    Type:   Image     Comment:   External Document

## 2010-06-10 NOTE — Assessment & Plan Note (Signed)
Summary: CPX / NWS  #   Vital Signs:  Patient profile:   60 year old male Height:      67 inches Weight:      235 pounds BMI:     36.94 O2 Sat:      96 % on Room air Temp:     98.2 degrees F oral Pulse rate:   63 / minute BP sitting:   138 / 84  (left arm) Cuff size:   large  Vitals Entered By: Bill Salinas CMA (July 29, 2009 1:40 PM)  O2 Flow:  Room air CC: PT HERE FOR CPX/ ab pt declined flu shot, he has never had shingles vaccine and his last colonoscopy was here at [facility]/ ab  Vision Screening:      Vision Comments: last eye exam 2 years ago at [facility] Entered By: Bill Salinas CMA (July 29, 2009 1:42 PM)   Primary Care Provider:  Sadey Yandell  CC:  PT HERE FOR CPX/ ab pt declined flu shot and he has never had shingles vaccine and his last colonoscopy was here at [facility]/ ab.  History of Present Illness: Patient presents for medical follow-up. In the interval since April '10 - no major illness, no surgeries. He did injure his left wrist-over use - wound up with injury to wrist. Saw [facility]: fitted with a brace and started on Meloxicam. He developed dark stools and was told to stop meloxicam. He has decreased use of his left wrist. He would like to see Dr. Eulah Pont.  He does have some universal joint discomfort.   Depression is a recurrent theme. He reports that he is having a difficult time now. There is a motivational issue as well as a sense of sadness. His symptoms are intermittent. Raised the issue of SAD as a possible cause.   Current Medications (verified): 1)  Pepcid Ac 10 Mg Chew (Famotidine) .Marland Kitchen.. 1 By Mouth Prn 2)  Eql Fish Oil 1000 Mg Caps (Omega-3 Fatty Acids) .... Daily  Allergies (verified): 1)  ! 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; Biomedical engineer  Risk Factors: Alcohol Use: <1 (Jun 09, 2007) Caffeine Use: 2 (06/09/07) Exercise: yes (2007/06/09)  Risk Factors: Smoking Status: never (01/18/2007)  Past Medical History: WRIST PAIN, LEFT, CHRONIC (ICD-719.43) BACK PAIN (ICD-724.5) DEPRESSIVE DISORDER NOT ELSEWHERE CLASSIFIED (ICD-311) OBESITY, UNSPECIFIED (ICD-278.00) HYPERGLYCEMIA, FASTING (ICD-790.29) SPECIAL SCREENING MALIGNANT NEOPLASM OF PROSTATE (ICD-V76.44) ROUTINE GENERAL MEDICAL EXAM@HEALTH  CARE FACL (ICD-V70.0) SPINAL STENOSIS (ICD-724.00) GERD (ICD-530.81) HYPERLIPIDEMIA (ICD-272.4)        Review of Systems  The patient denies anorexia, fever, weight loss, weight gain, vision loss, decreased hearing, hoarseness, chest pain, syncope, dyspnea on exertion, prolonged cough, headaches, hemoptysis, abdominal pain, severe indigestion/heartburn, genital sores, muscle weakness, suspicious skin lesions, difficulty walking, abnormal bleeding, and angioedema.    Physical Exam  General:  Overweight white male in no distress Head:  Normocephalic and atraumatic without obvious abnormalities. No apparent alopecia or balding. Eyes:  No corneal or conjunctival inflammation noted. EOMI. Perrla. Funduscopic exam benign, without hemorrhages, exudates or papilledema.  Vision grossly normal. Ears:  External ear exam shows no significant lesions or deformities.  Otoscopic examination reveals clear canals, tympanic membranes are intact bilaterally without bulging, retraction, inflammation or discharge. Hearing is grossly normal bilaterally. Nose:  External nasal examination shows no deformity or inflammation. Nasal mucosa  are pink and moist without lesions or exudates. Mouth:  Oral mucosa and oropharynx without lesions or exudates.  Teeth in good repair. Neck:  supple, full ROM, no thyromegaly, and no carotid bruits.   Chest Wall:  no deformities and no tenderness.   Lungs:  Normal respiratory effort, chest expands symmetrically. Lungs are clear to auscultation, no crackles or wheezes. Heart:  Normal rate and regular rhythm. S1 and S2 normal without gallop, murmur, click, rub or other extra sounds. Abdomen:  protruberant, BS+, no guarding or rebound, no hepatomegaly Rectal:  No external abnormalities noted. Normal sphincter tone. No rectal masses or tenderness. Prostate:  Prostate gland firm and smooth, no enlargement, nodularity, tenderness, mass, asymmetry or induration. Msk:  left wrist with some atrophy, decreased flexion and extension with some tenderness. Remainder of exam is normal Pulses:  2+ radial pulses Extremities:  No clubbing, cyanosis, edema, or deformity noted with normal full range of motion of all joints.   Neurologic:  alert & oriented X3, cranial nerves II-XII intact, strength normal in all extremities, gait normal, and DTRs symmetrical and normal.   Skin:  turgor normal, color normal, no rashes, and no ulcerations.   Cervical Nodes:  no anterior cervical adenopathy and no posterior cervical adenopathy.   Inguinal Nodes:  no R inguinal adenopathy and no L inguinal adenopathy.   Psych:  Oriented X3, memory intact for recent and remote, normally interactive, good eye contact, and subdued.     Impression & Recommendations:  Problem # 1:  WRIST PAIN, LEFT, CHRONIC (ICD-719.43) Patient with prolonged wrist pain and now loss of range of motion.  Plan - referred to Dr. Richardson Landry - appointment 07/30/09 at 1400 hrs. Patient aware  Problem # 2:  DEPRESSIVE DISORDER NOT ELSEWHERE CLASSIFIED (ICD-311) Recurrent symptoms. He has responded well to Venlafaxine in the past  Plan - resume venlafaxine  at low dose. May need to titrate up the dose based on response.           follow-up re: response in one month  His updated medication list for this problem includes:    Venlafaxine Hcl 37.5 Mg Xr24h-cap (Venlafaxine hcl) .Marland Kitchen... 1 by mouth once daily  Problem # 3:  OBESITY, UNSPECIFIED (ICD-278.00) On-going problem.  Plan - smart food choices, portion size control, more exercise.           May need TNT per rectum  Problem # 4:  HYPERGLYCEMIA, FASTING (ICD-790.29) Lab revealed a serum glucose in normal range.  Plan - smart food choices  Problem # 5:  HYPERLIPIDEMIA (ICD-272.4) LDL borderline at 39, LDL elevated at 180+. Framingham cardiac risk is 16% chance of cardiac event in the next 10 years. He is a candidate for medical management and risk reduction.  Plan - Lipitor 10mg  daily           follow-up lab in one month.  The following medications were removed from the medication list:    Simvastatin 20 Mg Tabs (Simvastatin) .Marland Kitchen... 1 by mouth qpm His updated medication list for this problem includes:    Lipitor 10 Mg Tabs (Atorvastatin calcium) .Marland Kitchen... 1 by mouth once daily  Problem # 6:  Preventive Health Care (ICD-V70.0) unremarkable exam except  for weight. Labs look good. Current re: colorectal and prostate cancer screening. Current with tetnus immunization.  In summary - a very nice man who needs better control of cholesterol, weight loss and medical management of depression. He will return for lab in one month.   Complete Medication List: 1)  Pepcid Ac 10 Mg Chew (Famotidine) .Marland Kitchen.. 1 by mouth prn 2)  Eql Fish Oil 1000 Mg Caps (Omega-3 fatty acids) .... Daily 3)  Lipitor 10 Mg Tabs (Atorvastatin calcium) .Marland Kitchen.. 1 by mouth once daily 4)  Venlafaxine Hcl 37.5 Mg Xr24h-cap (Venlafaxine hcl) .Marland Kitchen.. 1 by mouth once daily  Patient: James [name] Note: All result statuses are Final unless otherwise noted.  Tests: (1) BMP (METABOL)   Sodium                    139 mEq/L                    135-145   Potassium                 4.2 mEq/L                   3.5-5.1   Chloride                  109 mEq/L                   96-112   Carbon Dioxide            25 mEq/L                    19-32   Glucose              [H]  108 mg/dL                   56-21   BUN                       16 mg/dL                    3-08   Creatinine                1.0 mg/dL                   6.5-7.8   Calcium                   9.0 mg/dL                   4.6-96.2   GFR                       80.96 mL/min                >60  Tests: (2) Lipid Panel (LIPID)   Cholesterol          [H]  237 mg/dL                   9-528     ATP III Classification            Desirable:  < 200 mg/dL                    Borderline High:  200 - 239 mg/dL               High:  > =  240 mg/dL   Triglycerides        [H]  168.0 mg/dL                 0.4-540.9     Normal:  <150 mg/dL     Borderline High:  811 - 199 mg/dL   HDL                       91.47 mg/dL                 >82.95   VLDL Cholesterol          33.6 mg/dL                  6.2-13.0  CHO/HDL Ratio:  CHD Risk                             6                    Men          Women     1/2 Average Risk     3.4          3.3     Average Risk          5.0          4.4     2X Average Risk          9.6          7.1     3X Average Risk          15.0          11.0                           Tests: (3) CBC Platelet w/Diff (CBCD)   White Cell Count          5.0 K/uL                    4.5-10.5   Red Cell Count            4.96 Mil/uL                 4.22-5.81   Hemoglobin                15.5 g/dL                   86.5-78.4   Hematocrit                46.3 %                      39.0-52.0   MCV                       93.5 fl                     78.0-100.0   MCHC                      33.4 g/dL                   69.6-29.5   RDW                       13.6 %  11.5-14.6   Platelet Count            163.0 K/uL                  150.0-400.0   Neutrophil %              58.6 %                       43.0-77.0   Lymphocyte %              28.2 %                      12.0-46.0   Monocyte %                10.1 %                      3.0-12.0   Eosinophils%              2.9 %                       0.0-5.0   Basophils %               0.2 %                       0.0-3.0   Neutrophill Absolute      3.0 K/uL                    1.4-7.7   Lymphocyte Absolute       1.4 K/uL                    0.7-4.0   Monocyte Absolute         0.5 K/uL                    0.1-1.0  Eosinophils, Absolute                             0.1 K/uL                    0.0-0.7   Basophils Absolute        0.0 K/uL                    0.0-0.1  Tests: (4) Hepatic/Liver Function Panel (HEPATIC)   Total Bilirubin           0.7 mg/dL                   1.6-1.0   Direct Bilirubin          0.2 mg/dL                   9.6-0.4   Alkaline Phosphatase      48 U/L                      39-117   AST                       28 U/L                      0-37   ALT  32 U/L                      0-53   Total Protein             6.3 g/dL                    6.2-1.3   Albumin                   3.8 g/dL                    0.8-6.5  Tests: (5) TSH (TSH)   FastTSH                   2.00 uIU/mL                 0.35-5.50  Tests: (6) Prostate Specific Antigen (PSA)   PSA-Hyb                   0.72 ng/mL                  0.10-4.00  Tests: (7) UDip Only (UDIP)   Color                     Yellow       RANGE:  Yellow;Lt. Yellow   Clarity                   CLEAR                       Clear   Specific Gravity          >=1.030                     1.000 - 1.030   Urine Ph                  5.5                         5.0-8.0   Protein                   NEGATIVE                    Negative   Urine Glucose             NEGATIVE                    Negative   Ketones                   NEGATIVE                    Negative   Urine Bilirubin           NEGATIVE                    Negative   Blood                     NEGATIVE                     Negative   Urobilinogen              0.2  0.0 - 1.0   Leukocyte Esterace        NEGATIVE                    Negative   Nitrite                   NEGATIVE                    Negative  Tests: (8) Cholesterol LDL - Direct (DIRLDL)  Cholesterol LDL - Direct                             181.5 mg/dL     Optimal:  <161 mg/dL     Near or Above Optimal:  100-129 mg/dL     Borderline High:  096-045 mg/dL     High:  409-811 mg/dL     Very High:  >914 mg/dLPrescriptions: VENLAFAXINE HCL 37.5 MG XR24H-CAP (VENLAFAXINE HCL) 1 by mouth once daily  #30 x 2   Entered and Authorized by:   Jacques Navy MD   Signed by:   Jacques Navy MD on 07/29/2009   Method used:   Electronically to        [facility]  [address] [id number]       [address]       [place], [place]  [address]       Ph: [phone] or [phone]       Fax: [phone]   RxID:   [id number] LIPITOR 10 MG TABS (ATORVASTATIN CALCIUM) 1 by mouth once daily  #30 x 12   Entered and Authorized by:   Jacques Navy MD   Signed by:   Jacques Navy MD on 07/29/2009   Method used:   [id number]       [address]       [place], [place]  [address]       Ph: [phone] or [phone]       Fax: [phone]   RxID:   [id number]

## 2010-06-10 NOTE — Miscellaneous (Signed)
Summary: Orders Update  Clinical Lists Changes  Orders: Added new Referral order of Pulmonary Referral (Pulmonary) - Signed 

## 2010-06-10 NOTE — Progress Notes (Signed)
  Phone Note Call from Patient   Summary of Call: Recently saw Dr. Eulah Pont with wrist injury, placed on some Mobic, then had some stomache discomfort for a few days, took some Maalox with bismuth and got some black stools. No BRBPR. Feels OK, nontoxic. Told to d/c bismuth. d/c Mobic. No NSAIDS. OK to use Tylenol. If black stools continue should f/u next week.  Initial call taken by: Hannah Beat MD,  December 22, 2008 9:07 AM

## 2010-06-10 NOTE — Progress Notes (Signed)
Summary: ?  Phone Note Call from Patient Call back at [facility] Phone [phone]   Summary of Call: Pt has allergy to ammoxicillin. He wanted to confirm that it was ok to take the med he was prescribed.  Initial call taken by: Lamar Sprinkles, CMA,  January 17, 2010 4:52 PM  Follow-up for Phone Call        check WBC which is OK.   Yes, it is ok to take keflex - low cross over reactivity rate with cephalasporins and penicillins Follow-up by: Jacques Navy MD,  January 17, 2010 4:56 PM  Additional Follow-up for Phone Call Additional follow up Details #1::        Pt informed  Additional Follow-up by: Lamar Sprinkles, CMA,  January 17, 2010 5:38 PM

## 2010-06-10 NOTE — Assessment & Plan Note (Signed)
Summary: DISCUSS SURGERY---STC   Vital Signs:  Patient profile:   60 year old male Height:      67 inches Weight:      228 pounds BMI:     35.84 O2 Sat:      95 % on Room air Temp:     98.5 degrees F oral Pulse rate:   82 / minute BP sitting:   132 / 86  (left arm) Cuff size:   large  Vitals Entered By: Bill Salinas CMA (November 07, 2009 1:05 PM)  O2 Flow:  Room air CC: ov to discuss surg on left wrist. pt is sch for surg on November 27, 2009/ ab   Primary Care Provider:  Norins  CC:  ov to discuss surg on left wrist. pt is sch for surg on July 20 and 2011/ ab.  History of Present Illness: Patient is facing reconstructive surgery of the left wrist. 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 We discussed anatomy - reviewing drawings and plates, pulled up several case reports using PubMed. We discussed general anesthesia vs local or regional anesthesia with my recommendation for regional anesthesia if possible. Etiology of his problem is unknown but my possibly be due to vascular disruptions after a "whip" like injury to is left wrist.    Current Medications (verified): 1)  Pepcid Ac 10 Mg Chew (Famotidine) .Marland Kitchen.. 1 By Mouth Prn 2)  Eql Fish Oil 1000 Mg Caps (Omega-3 Fatty Acids) .... Daily 3)  Lipitor 10 Mg Tabs (Atorvastatin Calcium) .Marland Kitchen.. 1 By Mouth Once Daily 4)  Venlafaxine Hcl 37.5 Mg Xr24h-Cap (Venlafaxine Hcl) .Marland Kitchen.. 1 By Mouth Once Daily  Allergies (verified): 1)  ! Amoxicillin PMH-FH-SH reviewed-no changes except otherwise noted  Physical Exam  General:  Well-developed,well-nourished,in no acute distress; alert,appropriate and cooperative throughout examination Lungs:  normal respiratory effort.   Heart:  normal rate and regular rhythm.   Msk:  left wrist without swelling. Decreased ROM, especially flexion.  Skin:  turgor normal and color normal.   Psych:  Oriented X3, normally interactive, good eye contact, and slightly anxious.      Impression & Recommendations:  Problem # 1:  WRIST PAIN, LEFT, CHRONIC (ICD-719.43) See HPI. He is schuduled for surgery in July. EKG today is normal. He is medically clear for surgery and anesthesia.  (greater than 50% of visit 25 min- education and counseling. )  Problem # 2:  HYPERLIPIDEMIA (ICD-272.4) for routine follow-up labs.  His updated medication list for this problem includes:    Lipitor 10 Mg Tabs (Atorvastatin calcium) .Marland Kitchen... 1 by mouth once daily  Orders: TLB-Lipid Panel (80061-LIPID)  Complete Medication List: 1)  Pepcid Ac 10 Mg Chew (Famotidine) .Marland Kitchen.. 1 by mouth prn 2)  Eql Fish Oil 1000 Mg Caps (Omega-3 fatty acids) .... Daily 3)  Lipitor 10 Mg Tabs (Atorvastatin calcium) .Marland Kitchen.. 1 by mouth once daily  Other Orders: TLB-Hepatic/Liver Function Pnl (80076-HEPATIC) EKG w/ Interpretation (93000)

## 2010-06-12 NOTE — Progress Notes (Signed)
  Phone Note Refill Request Message from:  Fax from Pharmacy on June 04, 2010 2:24 PM  Refills Requested: Medication #1:  LIPITOR 10 MG TABS 1 by mouth once daily Initial call taken by: Ami Bullins CMA,  June 04, 2010 2:24 PM    Prescriptions: LIPITOR 10 MG TABS (ATORVASTATIN CALCIUM) 1 by mouth once daily  #90 x 2   Entered by:   Ami Bullins CMA   Authorized by:   Jacques Navy MD   Signed by:   Bill Salinas CMA on 06/04/2010   Method used:   [facility]  [address] [id number]       [address]       [place], [place]  [address]       Ph: [phone] or [phone]       Fax: [phone]   RxID:   [id number]

## 2010-07-08 ENCOUNTER — Other Ambulatory Visit: Payer: Self-pay

## 2010-07-25 ENCOUNTER — Other Ambulatory Visit: Payer: Self-pay | Admitting: Internal Medicine

## 2010-07-25 ENCOUNTER — Other Ambulatory Visit: Payer: BC Managed Care – PPO

## 2010-07-25 ENCOUNTER — Encounter (INDEPENDENT_AMBULATORY_CARE_PROVIDER_SITE_OTHER): Payer: Self-pay | Admitting: *Deleted

## 2010-07-25 DIAGNOSIS — Z0389 Encounter for observation for other suspected diseases and conditions ruled out: Secondary | ICD-10-CM

## 2010-07-25 DIAGNOSIS — Z0000 Encounter for general adult medical examination without abnormal findings: Secondary | ICD-10-CM

## 2010-07-25 LAB — CBC WITH DIFFERENTIAL/PLATELET
Basophils Absolute: 0 10*3/uL (ref 0.0–0.1)
Basophils Relative: 0.5 % (ref 0.0–3.0)
Eosinophils Absolute: 0.2 10*3/uL (ref 0.0–0.7)
Eosinophils Relative: 2.2 % (ref 0.0–5.0)
HCT: 46.8 % (ref 39.0–52.0)
Hemoglobin: 16.2 g/dL (ref 13.0–17.0)
Lymphocytes Relative: 24.7 % (ref 12.0–46.0)
Lymphs Abs: 1.7 10*3/uL (ref 0.7–4.0)
MCHC: 34.6 g/dL (ref 30.0–36.0)
MCV: 91.8 fl (ref 78.0–100.0)
Monocytes Absolute: 0.6 10*3/uL (ref 0.1–1.0)
Monocytes Relative: 8.8 % (ref 3.0–12.0)
Neutro Abs: 4.3 10*3/uL (ref 1.4–7.7)
Neutrophils Relative %: 63.8 % (ref 43.0–77.0)
Platelets: 209 10*3/uL (ref 150.0–400.0)
RBC: 5.09 Mil/uL (ref 4.22–5.81)
RDW: 13.8 % (ref 11.5–14.6)
WBC: 6.7 10*3/uL (ref 4.5–10.5)

## 2010-07-25 LAB — TSH: TSH: 1.89 u[IU]/mL (ref 0.35–5.50)

## 2010-07-25 LAB — BASIC METABOLIC PANEL
BUN: 18 mg/dL (ref 6–23)
CO2: 28 mEq/L (ref 19–32)
Calcium: 9.2 mg/dL (ref 8.4–10.5)
Chloride: 108 mEq/L (ref 96–112)
Creatinine, Ser: 1.2 mg/dL (ref 0.4–1.5)
GFR: 64.75 mL/min (ref >60.00)
Glucose, Bld: 107 mg/dL — ABNORMAL HIGH (ref 70–99)
Potassium: 5.2 mEq/L — ABNORMAL HIGH (ref 3.5–5.1)
Sodium: 142 mEq/L (ref 135–145)

## 2010-07-25 LAB — HEPATIC FUNCTION PANEL
ALT: 28 U/L (ref 0–53)
AST: 20 U/L (ref 0–37)
Albumin: 3.9 g/dL (ref 3.5–5.2)
Alkaline Phosphatase: 59 U/L (ref 39–117)
Bilirubin, Direct: 0.1 mg/dL (ref 0.0–0.3)
Total Bilirubin: 0.7 mg/dL (ref 0.3–1.2)
Total Protein: 6.5 g/dL (ref 6.0–8.3)

## 2010-07-25 LAB — LIPID PANEL
Cholesterol: 178 mg/dL (ref 0–200)
HDL: 32.8 mg/dL — ABNORMAL LOW (ref >39.00)
LDL Cholesterol: 117 mg/dL — ABNORMAL HIGH (ref 0–99)
Total CHOL/HDL Ratio: 5
Triglycerides: 142 mg/dL (ref 0.0–149.0)
VLDL: 28.4 mg/dL (ref 0.0–40.0)

## 2010-07-25 LAB — URINALYSIS
Bilirubin Urine: NEGATIVE
Hgb urine dipstick: NEGATIVE
Ketones, ur: NEGATIVE
Leukocytes, UA: NEGATIVE
Nitrite: NEGATIVE
Specific Gravity, Urine: 1.02 (ref 1.000–1.030)
Total Protein, Urine: NEGATIVE
Urine Glucose: NEGATIVE
Urobilinogen, UA: 0.2 (ref 0.0–1.0)
pH: 5.5 (ref 5.0–8.0)

## 2010-07-25 LAB — PSA: PSA: 0.81 ng/mL (ref 0.10–4.00)

## 2010-07-26 LAB — POCT HEMOGLOBIN-HEMACUE: Hemoglobin: 15.9 g/dL (ref 13.0–17.0)

## 2010-07-31 ENCOUNTER — Encounter: Payer: Self-pay | Admitting: Internal Medicine

## 2010-07-31 ENCOUNTER — Ambulatory Visit (INDEPENDENT_AMBULATORY_CARE_PROVIDER_SITE_OTHER): Payer: BC Managed Care – PPO | Admitting: Internal Medicine

## 2010-07-31 VITALS — BP 124/82 | HR 75 | Temp 98.5°F | Ht 67.0 in | Wt 235.0 lb

## 2010-07-31 DIAGNOSIS — Z0000 Encounter for general adult medical examination without abnormal findings: Secondary | ICD-10-CM

## 2010-07-31 NOTE — Progress Notes (Signed)
Subjective:    Patient ID: James [name], male    DOB: [birth year], 60 y.o.   MRN: [id number]  HPI Mr. Lemmerman presents for a general wellness exam. His major complaints are: 1 - residual soreness in the left wrist, trigger finger left 3rd digit and some decreased ROM. 2 - Mild ED 3- weight gain over the winter.  Past Medical History  Diagnosis Date  . Wrist pain, left   . Back pain   . Depressive disorder, not elsewhere classified   . Other abnormal glucose   . Special screening for malignant neoplasm of prostate   . Routine general medical examination at a health care facility   . Spinal stenosis, unspecified region other than cervical   . GERD (gastroesophageal reflux disease)   . Colon polyps     10/27/2002, repeat letter 09/17/2007  . Other and unspecified hyperlipidemia   . MPN (myeloproliferative neoplasm)     1st detected 06/04/1998  . Hemoptysis     abnormal CT Chest 01/29/10 - ? new GG changes RUL > not viz on plain cxr 02/26/2010  . Positive PPD     per pt    Past Surgical History  Procedure Date  . Tonsillectomy   . Anterior cruciate ligament repair     LEFT at 15  . Wrist surgery 12/2009    Reconstruction, [name]    Family History  Problem Relation Age of Onset  . Heart disease Father   . Heart disease Paternal Uncle   . Prostate cancer Neg Hx   . Colon cancer Neg Hx   . Hypertension Neg Hx   . Hyperlipidemia Neg Hx   . Diabetes Neg Hx    History   Social History  . Marital Status: Married    Spouse Name: N/A    Number of Children: 3  . Years of Education: N/A   Occupational History  . Retired, Multimedia programmer   . Retired, Real estate     Slow   Social History Main Topics  . Smoking status: Never Smoker   . Smokeless tobacco: Not on file  . Alcohol Use: 0.6 oz/week    1 Cans of beer per week     Socially   . Drug Use: No  . Sexually Active: Not on file   Other Topics Concern  . Not on file   Social History Narrative   [employer] '73  2 sons - '74,   '76  1 daughter -  '77  4 grandchildren           Review of Systems  Constitutional: Negative.        [Has 10-20 min hot flash neck up after hot shower and sometimes with eating HENT: Negative.   Eyes: Negative.   Respiratory: Positive for shortness of breath. Negative for apnea, cough, choking and chest tightness.        [No recurrent hemoptysis Cardiovascular: Negative.   Gastrointestinal: Negative.   Genitourinary: Negative.        [Nocturia x1.  Mild ED Musculoskeletal:       [Swelling of left wrist, trigger finger left 3rd digit.  Neurological: Negative.   Hematological: Negative.   Psychiatric/Behavioral: Negative.        Objective:   Physical Exam  [nursing notereviewed. Constitutional: He is oriented to person, place, and time. He appears well-developed and well-nourished.       overweight  HENT:  Head: Normocephalic and atraumatic.  Right Ear: External ear  normal.  Left Ear: External ear normal.  Nose: Nose normal.  Mouth/Throat: Oropharynx is clear and moist.  Eyes: Conjunctivae and EOM are normal. Pupils are equal, round, and reactive to light. No scleral icterus.       Lenses clear, fundi normal  Neck: Normal range of motion. Neck supple. No tracheal deviation present. No thyromegaly present.       Tender left anterior cervical chain and submandibular region without palpable nodes.   Cardiovascular: Normal rate, regular rhythm, normal heart sounds and intact distal pulses.        normla radial pulses; normal posterior tibial pulses; diminished dorsalis pedis pulses  Pulmonary/Chest: Effort normal and breath sounds normal. No respiratory distress. He has no wheezes. He has no rales.  Abdominal: Soft. Bowel sounds are normal. He exhibits distension. There is no tenderness. There is no rebound and no guarding.  Musculoskeletal: Normal range of motion.       No obvious swelling left wrist. Scar is visible.   Neurological: He is  alert and oriented to person, place, and time. He has normal reflexes. No cranial nerve deficit. Coordination normal.  Skin: Skin is warm and dry. No erythema.  Psychiatric: He has a normal mood and affect. His behavior is normal. Thought content normal.    Lab Results  Component Value Date   WBC 6.7 07/25/2010   HGB 16.2 07/25/2010   HCT 46.8 07/25/2010   PLT 209.0 07/25/2010   CHOL 178 07/25/2010   TRIG 142.0 07/25/2010   HDL 32.80* 07/25/2010   LDLDIRECT 134.1 11/07/2009   ALT 28 07/25/2010   AST 20 07/25/2010   NA 142 07/25/2010   K 5.2* 07/25/2010   CL 108 07/25/2010   CREATININE 1.2 07/25/2010   BUN 18 07/25/2010   CO2 28 07/25/2010   TSH 1.89 07/25/2010   PSA 0.81 07/25/2010     Assessment & Plan:  1. Cholesterol - LDL is just above goal of 540 on low dose lipitor. Mr. Karnes admits to dietary indiscretion over the winter  Plan - will continue present dose of lipitor           Better dietary compliance - low fat           Increased physical activity  2. Ortho - still with pain in the left wrist but better function than before surgery. He will be returning to surgeon for trigger finger injection but he clearly states he is not contemplating additional surgery at this time. Chronic back pain continues to be an issue but not limiting his activities in a major way.  Plan - flex/stretch exercise in addition cardio workout (can check out exercise videos on YouTube or other internet sources)  3. Hyperglycemia - lab reveals glucose of 107 - in normal range of 70-115.  Plan - continue prudent diet and physical activity  4. Obesity - fluctuating weight over the winter and still over his desired weight = 215  Plan - portion size control with meals and limited snacks            Increase in physical activity  5. Psych - long term problem that will wax and wane. He reports that it was a tough year, due in part to "intimacy" issues. Presently doing fairly well. No indication for medical  management.  6. Health maintenance: interval history as above. Physical exam is OK - overweight, limitation in hand movement. Lab results are good. Colorectal cancer screening is up to date. Immunizations: last tetnus March '  10; due for pneumonia vaccine and a candidate for shingle vaccine. PSA is normal  In summary - a very nice man who does appear to be medically stable with a need to work on weight and flexibility. He will return as needed or in 1 year.

## 2010-08-01 ENCOUNTER — Encounter: Payer: Self-pay | Admitting: Internal Medicine

## 2010-08-19 ENCOUNTER — Other Ambulatory Visit: Payer: Self-pay | Admitting: Internal Medicine

## 2010-08-19 DIAGNOSIS — R911 Solitary pulmonary nodule: Secondary | ICD-10-CM

## 2010-08-26 ENCOUNTER — Ambulatory Visit (INDEPENDENT_AMBULATORY_CARE_PROVIDER_SITE_OTHER)
Admission: RE | Admit: 2010-08-26 | Discharge: 2010-08-26 | Disposition: A | Discharge status: Home / Self Care | Payer: BC Managed Care – PPO | Source: Ambulatory Visit | Attending: Internal Medicine | Admitting: Internal Medicine

## 2010-08-26 DIAGNOSIS — R911 Solitary pulmonary nodule: Secondary | ICD-10-CM

## 2010-08-26 DIAGNOSIS — J984 Other disorders of lung: Secondary | ICD-10-CM

## 2010-09-01 ENCOUNTER — Other Ambulatory Visit: Payer: Self-pay | Admitting: Infectious Diseases

## 2010-09-01 ENCOUNTER — Telehealth: Payer: Self-pay | Admitting: Internal Medicine

## 2010-09-01 ENCOUNTER — Ambulatory Visit
Admission: RE | Admit: 2010-09-01 | Discharge: 2010-09-01 | Disposition: A | Discharge status: Home / Self Care | Payer: No Typology Code available for payment source | Source: Ambulatory Visit | Attending: Infectious Diseases | Admitting: Infectious Diseases

## 2010-09-01 DIAGNOSIS — R7611 Nonspecific reaction to tuberculin skin test without active tuberculosis: Secondary | ICD-10-CM

## 2010-09-01 NOTE — Telephone Encounter (Signed)
Called spoke with patient who states he had a cxr done and was told that he was "pretty sure that he doesn't have TB" but a nurse brought sputum cups to his house for him to leave samples.  Pt is concerned and confused.  He is requesting to speak to MW personally to help him understand - if it's not TB, what is it?  Per pt, MW can speak with Dr. [name] w/ any questions.  Pt aware MW not in office this afternoon, but will return tomorrow.

## 2010-09-02 NOTE — Telephone Encounter (Signed)
Discussed implications await sputums

## 2010-09-18 ENCOUNTER — Telehealth: Payer: Self-pay | Admitting: Internal Medicine

## 2010-09-18 NOTE — Telephone Encounter (Signed)
Please advise Dr. Sherene Sires if you attempted to call pt. Thanks  Carver Fila, CMA

## 2010-09-19 ENCOUNTER — Telehealth: Payer: Self-pay | Admitting: Internal Medicine

## 2010-09-19 NOTE — Telephone Encounter (Signed)
Discussed prelim tb neg  Needs f/u ov for 6 weeks with cxr scheduled

## 2010-09-19 NOTE — Telephone Encounter (Signed)
Spoke with pt and sched rov with MW for 10/29/10 at 1:45 pm.

## 2010-09-23 NOTE — Telephone Encounter (Signed)
See phone note from 5/11 

## 2010-09-26 NOTE — Letter (Signed)
January 21, 2006     Lucky Cowboy, M.D.  [facility]  [address] [place], [place] [address]   RE:  KROY, SPRUNG  MRN:  [id number]  /  DOB:  08/08/1950   Dear Leslye Peer,   Thank you for very much for seeing Mr. Krach for persistent recurrent  swelling and discomfort in the left submandibular region with radiation of  pain and discomfort down his neck.  I have seen this patient for this  problem on several occasions and will wax and wane.  I happened to ascertain  CT scan of the neck which was unremarkable with no evidence of any abscess  or enlarged lymph nodes.  This was performed May 18, 2005, and was done  at [facility] available on E-chart.   The patient is otherwise relatively healthy.  He is followed for  hyperlipidemia and GERD.  I have enclosed my last 2 annual examinations so  that you have background information on him.   His current medications are Lipitor 10 mg daily and Nexium 40 mg on a p.r.n.  basis.   If I can provide any additional information, please do not hesitate to  contact me.  As always, I appreciate help and assistance in evaluation of  these nice patients and look forward to hearing from you.   Sincerely,     Rosalyn Gess. Norins, MD   MEN/MedQ  DD:  01/21/2006 DT:  01/21/2006 Job #:  [id number]

## 2010-09-26 NOTE — Procedures (Signed)
NAME:  James [name], James [name]                ACCOUNT NO.:  [id number]   MEDICAL RECORD NO.:  [id number]          PATIENT TYPE:  OUT   LOCATION:  SLEEP CENTER                 FACILITY[facility]  [facility]   PHYSICIAN:  Clinton D. Maple Hudson, M.D. DATE OF BIRTH:  04/03/1951   DATE OF STUDY:                              NOCTURNAL POLYSOMNOGRAM   REFERRING PHYSICIAN:  Dr. Hermelinda Medicus.   DATE OF STUDY:  April 07, 2005.   INDICATION FOR STUDY:  Hypersomnia with sleep apnea.   EPWORTH SLEEPINESS SCORE:  8/24.   BMI:  32.   WEIGHT:  215 pounds. Neck size 18.5 inches.   SLEEP ARCHITECTURE:  Total sleep time 310 minutes with sleep efficiency 75%.  Stage I was 13%, stage II 63%, stages III and IV were absent, REM 25% of  total sleep time. Sleep latency 43 minutes, REM latency 167 minutes, awake  after sleep onset 55 minutes, arousal index 35. No bedtime medications were  taken. The patient complained of back pain.   RESPIRATORY DATA:  NPSG protocol. Apnea/hypopnea index (AHI, RDI) 45.7  obstructive events per hour indicating moderately severe obstructive sleep  apnea/hypopnea syndrome. This included 5 central apneas, 146 obstructive  apneas, 8 mixed apneas and 77 hypopneas. Events were similarly common while  supine and on left side which were his primary sleep positions. REM AHI 37  per hour.   OXYGEN DATA:  Moderate to occasionally loud snoring with oxygen desaturation  to a nadir of 78%. Mean oxygen saturation through the study was 92% on room  air.   CARDIAC DATA:  Sinus rhythm with occasional PVC.   MOVEMENT/PARASOMNIA:  Occasional leg jerks with little effect on sleep.   IMPRESSION/RECOMMENDATIONS:  1.  Moderately severe obstructive sleep apnea/hypopnea syndrome, AHI 45.7      per hour. Nonpositional events with      moderate to loud snoring and oxygen desaturation to 78%.  2.  Consider return for C-PAP titration or evaluate for alternative      therapies as appropriate.      Clinton D.  Maple Hudson, M.D.  Diplomate, Biomedical engineer of Sleep Medicine  Electronically Signed     CDY/MEDQ  D:  04/12/2005 10:25:36  T:  04/12/2005 22:41:11  Job:  [id number]

## 2010-09-26 NOTE — Assessment & Plan Note (Signed)
[facility]                           PRIMARY CARE OFFICE NOTE   NAME:HORNEYJoyce, Leckey                       MRN:          [id number]  DATE:05/26/2006                            DOB:          12/02/1950    Mr. Baskette is a 60 year old Caucasian gentleman who presents for  followup.  I  last saw him January 21, 2006 for intermittent  palpitations and continuing problems with pain in his left submandibular  and throat region.   The patient subsequently has come to see Dr. Lucky Cowboy on multiple  occasions.  She has diagnosed Mikulicz's disease, which is a benign  parotid hypertrophy.  Her note indicates she is going to check for  Sjogren syndrome, as well as polyclonal hypergammaglobulinemia.  I have  no lab reports for that telling me these were negative.  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  He reports that this is intermittently painful on  an ongoing basis.  He is scheduled to see her in followup.   The patient did have a positive sleep study in the past that showed  moderate-to-severe obstructive sleep apnea with 47.4 events per hour.  It was Dr. Leta Jungling who had this report and set the patient up for a CPAP.  The patient is using nasal plug CPAP but he is not able to wear this  through the night.  He is continuing to work on this and understands the  importance.  The patient otherwise reports he is doing relatively well.   PAST MEDICAL HISTORY:  SURGICAL:  1. Tonsillectomy.  2. Knee surgery.   MEDICAL:  1. Usual childhood disease.  2. Reflux.  3. Hyperlipidemia.  4. Chronic back pain.  5. Episodes of depression intermittently.   CURRENT MEDICATIONS:  1. Lipitor 10 mg daily.  2. Nexium 40 mg p.r.n.   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  The patient reports his home situation  is stable.   FAMILY HISTORY:   Positive for father having had a fatal MI age 96,  paternal uncle with fatal MI in his 22s.  Family history is positive for  hyperlipidemia.   REVIEW OF SYSTEMS:  The patient has had no fevers, sweats, or chills.  He continues to have some sleep apnea.  He has had an eye exam in the  last 12 months.  No dental problems, no ENT problems except as noted  above.  The patient does have occasional palpitations but no chest pain  or chest discomfort or limitations in his activities.  No respiratory  complaints.  The patient reports his reflux is better on Nexium.  No GU  complaints.  Musculoskeletal significant for ongoing knee and back pain  which is strain related and not limiting in his activities.  No  dermatologic or neurologic complaints.   CHART REVIEW:  Last colonoscopy from October 27, 2002 which was  unremarkable with follow up scheduled for 2009.  Did have hyperplastic  polyp.  Patient did have cardiac catheterization June 03, 1998 which showed  insignificant coronary artery disease with normal left ventricular  function.  The patient did have a stress Cardiolite study June 03, 1998 which did suggest inferior wall ischemia.  Last 2D echo from  March 09, 2001, showed an EF of 60% and was otherwise unremarkable.  The patient has had a CT scan, neck, May 18, 2005 which showed no  evidence of mass, abscess, or adenopathy in the left neck.  There was a  small extrapleural lipoma at the right apex.  The patient had an  abdominal ultrasound May 09, 2003 with normal gallbladder and bile  duct with an incidental 4.1-cm x 4.2-cm cyst of the right lobe of the  lover.  They questioned this was fatty infiltrate.   The patient had a CT scan of chest February 23, 2001 with stable tiny  nodules in both lungs.  No acute abnormality.   The patient did use an event recorder August 19, 2005 to Sep 18, 2005  which showed the patient to have some PVCs, sinus bradycardia, no other   abnormalities were otherwise noted.   EXAMINATION:  Temperature was 98.2, blood pressure was 155/82, pulse 74,  weight 230.  GENERAL APPEARANCE:  This is an overweight Caucasian gentleman in no  acute distress.  HEENT:  Normocephalic/atraumatic, EACs and TMs were normal, oropharynx  with native dentition in good repair, no buccal or palatal lesions were  noted, posterior pharynx was clear, conjunctivae and sclerae was clear,  PERRLA/EOMI, funduscopic exam was unremarkable.  NECK:  Supple without thyromegaly.  NODES:  No adenopathy was noted in the cervical supraclavicular regions.  CHEST:  With CVA tenderness.  LUNGS:  Clear to auscultation and percussion.  CARDIOVASCULAR:  With 2+ radial pulse, no JVD or carotid bruits.  He had  a quiet precordium with regular rate and rhythm without murmurs, rubs,  or gallops.  ABDOMEN:  Soft, no guarding or rebound, there was no organosplenomegaly.  He has a mild umbilical hernia which is nontender.  GENITALIA:  Normal male, bilaterally descended testicles without masses.  RECTAL:  Normal sphincter tone was noted.  Prostate was smooth, round,  and normal size and contour without abnormality.  Stool was guaiac  negative.  EXTREMITIES:  Without clubbing, cyanosis, edema, or deformity.  NEUROLOGIC:  Nonfocal.   DATABASE:  Hemoglobin was 15.9 grams, white count was 6000 with a normal  differential.  Cholesterol is 176, triglycerides 108, HDL was 36, LDL  118.  Chemistries were unremarkable with normal electrolytes, glucose  was elevated at 121.  Kidney function was normal with a creatinine of  1.3 and a GFR of 60.  Liver functions were normal.  TSH was normal at  1.85.  PSA was normal at 0.60.  Urinalysis was unremarkable.   ASSESSMENT/PLAN:  1. Hyperlipidemia.  The patient is not quite at goal with an LDL of      118, his goal will be 100 or less.  PLAN:  Increasing Lipitor to 20 mg daily. 1. Gastroesophageal reflux disease.  The patient is doing  well with      Nexium, will continue this on a p.r.n. basis.  2. Ear, nose, and throat.  The patient with clonic parotid gland      enlargement, not thought to be of a concern.  The patient does have      ongoing problems with left submandibular salivary gland tenderness.      He is to follow up  with Dr. Gerilyn Pilgrim.  3. Obstructive sleep apnea.  The patient is using nasal plug CPAP.  He      will continue to try to extend his duration of use and get to where      he is wearing this 4-5 hours per night.  4. Hyperglycemia.  The patient is running a mildly elevated serum      glucose.  At this point we will have the patient follow a no sugar,      low carbohydrate diet.  He also should increase his exercise      quotient.  Will monitor with followup lab with A1c in 3 months.   We will also followup on his lipids with a repeat lipid panel in 3  months with a goal of an LDL of 100 or less.   SUMMARY:  He is a very pleasant patient with problems outlined above.  Generally medically stable but does have some metabolic derangement with  plan as outlined above.     Rosalyn Gess Norins, MD  Electronically Signed    MEN/MedQ  DD: 05/27/2006  DT: 05/27/2006  Job #: [id number]   cc:   Willette Brace  Lucky Cowboy, MD

## 2010-10-24 ENCOUNTER — Encounter: Payer: Self-pay | Admitting: *Deleted

## 2010-10-29 ENCOUNTER — Ambulatory Visit (INDEPENDENT_AMBULATORY_CARE_PROVIDER_SITE_OTHER): Payer: Self-pay | Admitting: Internal Medicine

## 2010-10-29 ENCOUNTER — Ambulatory Visit (INDEPENDENT_AMBULATORY_CARE_PROVIDER_SITE_OTHER)
Admission: RE | Admit: 2010-10-29 | Discharge: 2010-10-29 | Disposition: A | Discharge status: Home / Self Care | Payer: BC Managed Care – PPO | Source: Ambulatory Visit | Attending: Internal Medicine | Admitting: Internal Medicine

## 2010-10-29 ENCOUNTER — Encounter: Payer: Self-pay | Admitting: Internal Medicine

## 2010-10-29 VITALS — BP 136/74 | HR 75 | Temp 97.9°F | Ht 67.0 in | Wt 233.8 lb

## 2010-10-29 DIAGNOSIS — R911 Solitary pulmonary nodule: Secondary | ICD-10-CM

## 2010-10-29 DIAGNOSIS — J984 Other disorders of lung: Secondary | ICD-10-CM

## 2010-10-29 NOTE — Progress Notes (Signed)
Subjective:     Patient ID: James [name], male   DOB: 1951/02/10, 60 y.o.   MRN: [id number]  HPI  4 yowm never smoker with ? EIA but never needed an inhaler and recurrent pattern of cough on exposure to grass if dry and dusty.   February 26, 2010 1st pulmonary office eval cc cough x after Labor day when moweed the grass when dry and dusty with cough immediately then 3 days later fever then saw Norins 9/9 rx abx then 3 days after that started noticing traces of brb no epistaxis > CT with MPN's. Previous CT 2002 also with MPNs but not available (informed it was recycled). Presently pt feels fine x hoarseness with voice use and mild HB.  March  2012 cleared out metal storage building lots of dust, felt bad the next day and coughed up dark stuff.  08/19/10  CT c/w ? Early cavitary nodule rul >  Referred to Health Dept since reported Pos PPD but all sputums neg and repeat PPD neg as well.  10/29/2010 ov f/u repeat PPD neg and afb studies neg to date per health dep and pt has no complaints.    Pt denies any significant sore throat, dysphagia, itching, sneezing,  nasal congestion or excess/ purulent secretions,  fever, chills, sweats, unintended wt loss, pleuritic or exertional cp, hempoptysis, orthopnea pnd or leg swelling.    Also denies any obvious fluctuation of symptoms with weather or environmental changes or other aggravating or alleviating factors.         Past Medical History:  WRIST PAIN, LEFT, CHRONIC (ICD-719.43)  BACK PAIN (ICD-724.5)  DEPRESSIVE DISORDER NOT ELSEWHERE CLASSIFIED (ICD-311)  OBESITY, UNSPECIFIED (ICD-278.00)  HYPERGLYCEMIA, FASTING (ICD-790.29)  SPECIAL SCREENING MALIGNANT NEOPLASM OF PROSTATE (ICD-V76.44)  ROUTINE GENERAL MEDICAL EXAM@HEALTH  CARE FACL (ICD-V70.0)  SPINAL STENOSIS (ICD-724.00)  GERD (ICD-530.81)  Colon Polyps  - 10/27/2002 , repeat rec by letter 09/17/2007  HYPERLIPIDEMIA (ICD-272.4)  MPN  - 1st detected 06/04/98  Hemoptysis with abn CT Chest  01/29/10  - ? new GG changes RUL > not viz on plain cxr February 26, 2010  - Rect  08/19/10 Ground-glass nodule in the right upper lobe has enlarged in the  interval, and now has central cavitation.  H/O Pos PPD per pt > neg per Health dept 09/2010   Family History:  Reviewed history from 06/08/2007 and no changes required.  father- deceased @ 66; fatal MI  Paternal uncle - deceased @ 51's; fatal MI  mother - deceased @84 ; complications of PVD with intestinal angina  Neg- colon, prostate cancer; DM, HTN, Lipids   Social History:  [employer] BS  married '73  2 sons - '74, '76; 1 daughter '77; grandchildren 4  work: Barista and coaching- fully retired; Biomedical engineer  ETOH socially  Never smoker            Review of Systems     Objective:   Physical Exam  obese slt anxious amb wm nad wt 235 > 235 February 26, 2010 > 233 10/29/10  pseudowheeze resolves with purse lip maneuver  HEENT: nl dentition, turbinates, and orophanx. Nl external ear canals without cough reflex  NECK : without JVD/Nodes/TM/ nl carotid upstrokes bilaterally  LUNGS: no acc muscle use, clear to A and P bilaterally without cough on insp or exp maneuvers  CV: RRR no s3 or murmur or increase in P2, no edema  ABD: soft and nontender with nl excursion in the supine position. No bruits or  organomegaly, bowel sounds nl  MS: warm without deformities, calf tenderness, cyanosis or clubbing    Assessment:         Plan:

## 2010-10-29 NOTE — Patient Instructions (Signed)
The best option is to a limited  Biopsy of the Right upper lobe to see if specific diagnosis can be made.  This is done one morning at [facility] on arriving 715 am and home around 10 am.  The next best option is to do a comparison CT of the Right Upper Lobe around 1st of August- call Libby at [phone] to schedule this (limited study= limited radiation exposure)

## 2010-10-31 NOTE — Assessment & Plan Note (Addendum)
cxr today no change RUL but not viz well  I had an extended discussion with the patient and wife today lasting 15 to 20 minutes of a 25 minute visit on the following issues:   Unlikely this is ca since MPN dating back to 2000 - unless there are two different processes at play - TB or atypical tb or other granulomatous process seems much more likely but can't exclude a slow growing ca and pet not likely to be helpful in this ddx  Discussed in detail all the  indications, usual  risks and alternatives  relative to the benefits with patient who  Did not agree  to proceed with bronchoscopy with biopsy but did agree at least to a repeat CT in 6 weeks but if growing RUL lesion I would rec at that point just going ahead with excisional bx for definitive dx

## 2010-12-17 ENCOUNTER — Ambulatory Visit (INDEPENDENT_AMBULATORY_CARE_PROVIDER_SITE_OTHER)
Admission: RE | Admit: 2010-12-17 | Discharge: 2010-12-17 | Disposition: A | Discharge status: Home / Self Care | Payer: BC Managed Care – PPO | Source: Ambulatory Visit | Attending: Internal Medicine | Admitting: Internal Medicine

## 2010-12-17 DIAGNOSIS — R911 Solitary pulmonary nodule: Secondary | ICD-10-CM

## 2010-12-17 DIAGNOSIS — J984 Other disorders of lung: Secondary | ICD-10-CM

## 2010-12-17 NOTE — Progress Notes (Signed)
Quick Note:  Spoke with pt and notified of results per Dr. Wert. Pt verbalized understanding and denied any questions.  ______ 

## 2011-03-12 ENCOUNTER — Other Ambulatory Visit: Payer: Self-pay | Admitting: Internal Medicine

## 2011-04-09 ENCOUNTER — Ambulatory Visit (INDEPENDENT_AMBULATORY_CARE_PROVIDER_SITE_OTHER): Payer: BC Managed Care – PPO | Admitting: Internal Medicine

## 2011-04-09 ENCOUNTER — Other Ambulatory Visit: Payer: Self-pay | Admitting: Internal Medicine

## 2011-04-09 ENCOUNTER — Other Ambulatory Visit: Payer: BC Managed Care – PPO

## 2011-04-09 VITALS — BP 136/80 | HR 87 | Temp 99.4°F | Wt 235.0 lb

## 2011-04-09 DIAGNOSIS — N529 Male erectile dysfunction, unspecified: Secondary | ICD-10-CM

## 2011-04-09 DIAGNOSIS — F329 Major depressive disorder, single episode, unspecified: Secondary | ICD-10-CM

## 2011-04-09 MED ORDER — VENLAFAXINE HCL ER 75 MG PO CP24: 75.0000 mg | ORAL_CAPSULE | Freq: Every day | ORAL | Status: DC

## 2011-04-09 NOTE — Patient Instructions (Signed)
Depression - this may be a neurochemical imbalance. Retry the effexor ER and plan on this as a long term medication.  ED - will check a testosterone level         Trial of the viagra.

## 2011-04-10 LAB — TESTOSTERONE, FREE, TOTAL, SHBG
Sex Hormone Binding: 24 nmol/L (ref 13–71)
Testosterone, Free: 40.7 pg/mL — ABNORMAL LOW (ref 47.0–244.0)
Testosterone-% Free: 2.2 % (ref 1.6–2.9)
Testosterone: 181.16 ng/dL — ABNORMAL LOW (ref 250–890)

## 2011-04-12 NOTE — Assessment & Plan Note (Signed)
Recurrent bout of depression.  Plan - Venlafaxine ER 75 mg daily - he may finish a small supply of 37.5 mg tabs.           Will advance as tolerated to 125 mg for improved NE availability.           Follow-up appointment in 3-4 weeks

## 2011-04-12 NOTE — Progress Notes (Signed)
  Subjective:    Patient ID: James [name], male    DOB: 11/03/1950, 60 y.o.   MRN: [id number]  HPI James [name] presents for management of recurrent depression. This has been a problem in the past and he as had a recurrence. He reports that he has no energy and has deceased his exercise and has gained weight. He tore a ham-string escaping from a horde of hornets which further limited his activity. He reports that his libido is intact but he has had significant ED which compounds his depression. He has had many occurences of depression which may have been situational but over time it may that he has a neurochemical imbalance. He has tried SSRI and SSRI/NE products and did better with the later. He is not suicidal.  I have reviewed the patient's medical history in detail and updated the computerized patient record.    Review of Systems System review is negative for any constitutional, cardiac, pulmonary, GI or neuro symptoms or complaints other than as described in the HPI.     Objective:   Physical Exam Vitals noted - increased weight Gen'l - overweight white man in no acute distress Pulm - normal respirations Cor- RRR Psych - stable affect with occasional flushing and tearing. Very alert with good insight.       Assessment & Plan:

## 2011-04-13 ENCOUNTER — Encounter: Payer: Self-pay | Admitting: Internal Medicine

## 2011-05-13 ENCOUNTER — Other Ambulatory Visit: Payer: Self-pay | Admitting: *Deleted

## 2011-05-13 MED ORDER — VENLAFAXINE HCL ER 37.5 MG PO CP24: 37.5000 mg | ORAL_CAPSULE | Freq: Every day | ORAL | Status: DC

## 2011-06-08 ENCOUNTER — Telehealth: Payer: Self-pay

## 2011-06-08 NOTE — Telephone Encounter (Signed)
Pt called stating he was given  Samples of 50 mg viagra that worked well from him. Pt is requesting Rx for same to pharmacy

## 2011-06-08 NOTE — Telephone Encounter (Signed)
OK for viagra 50 mg, #6, prn refills.

## 2011-06-09 MED ORDER — SILDENAFIL CITRATE 50 MG PO TABS: 50.0000 mg | ORAL_TABLET | Freq: Every day | ORAL | Status: AC | PRN

## 2011-06-09 NOTE — Telephone Encounter (Signed)
Done

## 2011-07-29 ENCOUNTER — Encounter: Payer: Self-pay | Admitting: Internal Medicine

## 2011-07-29 ENCOUNTER — Ambulatory Visit (INDEPENDENT_AMBULATORY_CARE_PROVIDER_SITE_OTHER): Payer: BC Managed Care – PPO | Admitting: Internal Medicine

## 2011-07-29 VITALS — BP 142/78 | HR 80 | Temp 97.1°F | Resp 16 | Ht 67.5 in | Wt 237.0 lb

## 2011-07-29 DIAGNOSIS — R7309 Other abnormal glucose: Secondary | ICD-10-CM

## 2011-07-29 DIAGNOSIS — Z0000 Encounter for general adult medical examination without abnormal findings: Secondary | ICD-10-CM

## 2011-07-29 DIAGNOSIS — F3289 Other specified depressive episodes: Secondary | ICD-10-CM

## 2011-07-29 DIAGNOSIS — M4800 Spinal stenosis, site unspecified: Secondary | ICD-10-CM

## 2011-07-29 DIAGNOSIS — M549 Dorsalgia, unspecified: Secondary | ICD-10-CM

## 2011-07-29 DIAGNOSIS — E785 Hyperlipidemia, unspecified: Secondary | ICD-10-CM

## 2011-07-29 DIAGNOSIS — M25539 Pain in unspecified wrist: Secondary | ICD-10-CM

## 2011-07-29 DIAGNOSIS — Z125 Encounter for screening for malignant neoplasm of prostate: Secondary | ICD-10-CM

## 2011-07-29 DIAGNOSIS — F329 Major depressive disorder, single episode, unspecified: Secondary | ICD-10-CM

## 2011-07-29 DIAGNOSIS — E291 Testicular hypofunction: Secondary | ICD-10-CM | POA: Insufficient documentation

## 2011-07-29 DIAGNOSIS — E669 Obesity, unspecified: Secondary | ICD-10-CM

## 2011-07-29 MED ORDER — VENLAFAXINE HCL ER 37.5 MG PO CP24: 37.5000 mg | ORAL_CAPSULE | Freq: Every day | ORAL | Status: DC

## 2011-07-29 MED ORDER — SILDENAFIL CITRATE 50 MG PO TABS: 50.0000 mg | ORAL_TABLET | Freq: Every day | ORAL | Status: AC | PRN

## 2011-07-29 NOTE — Progress Notes (Signed)
Subjective:    Patient ID: James [name], male    DOB: Jul 18, 1950, 61 y.o.   MRN: [id number]  HPI Mr. Kotowski presents for routine medical follow-up. He wants to review meds - very concerned about lipitor causing low  Testosterone and mental effects based on his readings. Researched UpToDate and references. The evidence is scanty that there is an effect on testosterone. He would like a drug holiday from lipitor. He is also using viagra which works but does cause flushing and headache.   He does have left hip pain, low back pain and he does have left testicular pain intermittently.   His depression is improved on low dose Effexor ER. At this time he does not want to try a higher dose but will consider this if his mood and energy level don't continue to improve as we move into spring.  Past Medical History  Diagnosis Date  . Wrist pain, left   . Back pain   . Depressive disorder, not elsewhere classified   . Fasting hyperglycemia   . Special screening for malignant neoplasm of prostate   . Routine general medical examination at a health care facility   . Spinal stenosis, unspecified region other than cervical   . GERD (gastroesophageal reflux disease)   . Colon polyps     10/27/2002, repeat letter 09/17/2007  . Other and unspecified hyperlipidemia   . MPN (myeloproliferative neoplasm)     1st detected 06/04/1998  . Hemoptysis     abnormal CT Chest 01/29/10 - ? new GG changes RUL > not viz on plain cxr 02/26/2010  . Positive PPD     per pt  . Obesity    Past Surgical History  Procedure Date  . Tonsillectomy   . Anterior cruciate ligament repair     LEFT at 15  . Wrist surgery 12/2009    Reconstruction, [name]   Family History  Problem Relation Age of Onset  . Heart disease Father   . Heart disease Paternal Uncle   . Prostate cancer Neg Hx   . Colon cancer Neg Hx   . Hypertension Neg Hx   . Hyperlipidemia Neg Hx   . Diabetes Neg Hx    History   Social History  . Marital  Status: Married    Spouse Name: N/A    Number of Children: 3  . Years of Education: N/A   Occupational History  . Retired, Multimedia programmer   . Retired, Real estate     Slow   Social History Main Topics  . Smoking status: Never Smoker   . Smokeless tobacco: Never Used  . Alcohol Use: 0.6 oz/week    1 Cans of beer per week     Socially   . Drug Use: No  . Sexually Active: Yes -- Male partner(s)   Other Topics Concern  . Not on file   Social History Narrative   HS[employer], [employer]. Married '73.  2 sons - '74,   '76;  1 daughter -  '77  6 grandchildren.Work - Teacher, music now retired. ACP - not fully discussed.          Review of Systems Constitutional:  Negative for fever, chills, activity change. Positive for expected weight change.  HEENT:  Negative for hearing loss, ear pain, congestion, neck stiffness and postnasal drip. Negative for sore throat or swallowing problems. Negative for dental complaints.   Eyes: Negative for vision loss or change in visual acuity.  Respiratory: Negative  for chest tightness and wheezing. Negative for DOE.   Cardiovascular: Negative for chest pain or palpitations. No decreased exercise tolerance Gastrointestinal: No change in bowel habit. No bloating or gas. No reflux or indigestion Genitourinary: Negative for urgency, frequency, flank pain and difficulty urinating.  Musculoskeletal: Negative for myalgias, back pain, arthralgias and gait problem.  Neurological: Negative for dizziness, tremors, weakness and headaches.  Hematological: Negative for adenopathy.  Psychiatric/Behavioral: Negative for behavioral problems and dysphoric mood.      Objective:   Physical Exam Filed Vitals:   07/29/11 1351  BP: 142/78  Pulse: 80  Temp: 97.1 F (36.2 C)  Resp: 16   Wt Readings from Last 3 Encounters:  07/29/11 237 lb (107.502 kg)  04/09/11 235 lb (106.595 kg)  10/29/10 233 lb 12.8 oz (106.051 kg)   Gen'l -  overweight white man in no distrss HEENT- C&S clear, PERRLA, EOMI. EACs/TMs normal, oropharynx w/o lesions. Neck - supple, no thyromegaly Nodes - negative in the cervical and supraclavicular areas. PUlm - normal respirations, good air movement, no rales or wheezes. Cor - 2+ radial and DP pulses; no JVD, no carotid bruit, quiety precordium, RRR Abd - obese, soft, BS+ x 4, no HSM Genitalia - bilaterally descended testicles, minimal atrophy but not boggy. Ext - surgical scarring of left wrist with decreased ROM/flexion, no other deformity or abnormality. Neuro - A&O x 3, CN II-XII normal, motor strength normal, normal gait and balance.  Derm - no lesions face, neck, back, arms.  No labs - deferred for 6-8 weeks.       Assessment & Plan:

## 2011-07-31 ENCOUNTER — Encounter: Payer: Self-pay | Admitting: Internal Medicine

## 2011-07-31 DIAGNOSIS — Z0000 Encounter for general adult medical examination without abnormal findings: Secondary | ICD-10-CM | POA: Insufficient documentation

## 2011-07-31 NOTE — Assessment & Plan Note (Signed)
He reports that he noted a significant improvement in mood and energy level with low dose Effexor ER 37.5. Tolerating medications well.  Plan- continue present dose with ability to increase if needed.

## 2011-07-31 NOTE — Assessment & Plan Note (Signed)
Interval medical history has been stable. Physical exam is unremarkable except for weight. No labs at this time. He is current for colorectal cancer screening and will be due for routine exam in '14. Immunizations - current with tetanus; due for pneumonia and shingles vaccine.   In summary - a very nice man with several medical problems as noted but he is medically stable. He will return in 4-8 weeks for follow-up lab as discussed. He will call in regard to depression and mood if there are problems as the Spring unfolds.

## 2011-07-31 NOTE — Assessment & Plan Note (Signed)
He is aware of the increased risks associated with obesity.  Plan - weight management: smart food choices - avoid excessive carbs, sugar - especially in beverages; PORTION SIZE CONTROL; exercise.            Target BMI 30; Goal - to loose 1-2 lbs/month

## 2011-07-31 NOTE — Assessment & Plan Note (Signed)
Improved after surgery but he still has tolerable discomfort.

## 2011-07-31 NOTE — Assessment & Plan Note (Signed)
Stable w/o progressive symptoms.

## 2011-07-31 NOTE — Assessment & Plan Note (Signed)
Chronic back pain w/o progression in the interval. His weight is an exacerbating factor.  Plan- weight management.           Exercise program that includes back stretching.

## 2011-07-31 NOTE — Assessment & Plan Note (Signed)
He has had elevated serum glucose in the past.  Plan - A1C with next blood draw. Recommendations to follow.

## 2011-07-31 NOTE — Assessment & Plan Note (Signed)
Patient does not want to start testosterone replacement at this time. He does want a "statin" holiday and repeat T level to follow.

## 2011-07-31 NOTE — Assessment & Plan Note (Signed)
Reviewed previous labs: maximum LDL = 185 several years ago - must have taken a drug holiday! He does have concerns about side effects of "statins" with low T and depression.  Plan - drug holiday with follow-up labs off medications to include repeat testosterone level as well as lipid profile. Recommendations to follow.

## 2011-10-06 ENCOUNTER — Other Ambulatory Visit: Payer: Self-pay | Admitting: Internal Medicine

## 2011-10-07 NOTE — Telephone Encounter (Signed)
Rx sent to [facility]

## 2011-11-04 ENCOUNTER — Other Ambulatory Visit (INDEPENDENT_AMBULATORY_CARE_PROVIDER_SITE_OTHER): Payer: BC Managed Care – PPO

## 2011-11-04 DIAGNOSIS — Z125 Encounter for screening for malignant neoplasm of prostate: Secondary | ICD-10-CM

## 2011-11-04 DIAGNOSIS — E291 Testicular hypofunction: Secondary | ICD-10-CM

## 2011-11-04 DIAGNOSIS — R7309 Other abnormal glucose: Secondary | ICD-10-CM

## 2011-11-04 DIAGNOSIS — E785 Hyperlipidemia, unspecified: Secondary | ICD-10-CM

## 2011-11-04 LAB — HEMOGLOBIN A1C: Hgb A1c MFr Bld: 6.2 % (ref 4.6–6.5)

## 2011-11-04 LAB — LIPID PANEL
Cholesterol: 269 mg/dL — ABNORMAL HIGH (ref 0–200)
HDL: 40.3 mg/dL (ref >39.00)
Total CHOL/HDL Ratio: 7
Triglycerides: 122 mg/dL (ref 0.0–149.0)
VLDL: 24.4 mg/dL (ref 0.0–40.0)

## 2011-11-04 LAB — COMPREHENSIVE METABOLIC PANEL
ALT: 44 U/L (ref 0–53)
AST: 29 U/L (ref 0–37)
Albumin: 3.8 g/dL (ref 3.5–5.2)
Alkaline Phosphatase: 58 U/L (ref 39–117)
BUN: 16 mg/dL (ref 6–23)
CO2: 26 mEq/L (ref 19–32)
Calcium: 9.3 mg/dL (ref 8.4–10.5)
Chloride: 107 mEq/L (ref 96–112)
Creatinine, Ser: 1.1 mg/dL (ref 0.4–1.5)
GFR: 71.23 mL/min (ref >60.00)
Glucose, Bld: 125 mg/dL — ABNORMAL HIGH (ref 70–99)
Potassium: 5 mEq/L (ref 3.5–5.1)
Sodium: 141 mEq/L (ref 135–145)
Total Bilirubin: 0.8 mg/dL (ref 0.3–1.2)
Total Protein: 6.6 g/dL (ref 6.0–8.3)

## 2011-11-04 LAB — PSA: PSA: 0.66 ng/mL (ref 0.10–4.00)

## 2011-11-04 LAB — LDL CHOLESTEROL, DIRECT: Direct LDL: 199.2 mg/dL

## 2011-11-05 LAB — TESTOSTERONE, FREE, TOTAL, SHBG
Sex Hormone Binding: 21 nmol/L (ref 13–71)
Testosterone, Free: 51.5 pg/mL (ref 47.0–244.0)
Testosterone-% Free: 2.4 % (ref 1.6–2.9)
Testosterone: 212.69 ng/dL — ABNORMAL LOW (ref 300–890)

## 2011-11-08 ENCOUNTER — Encounter: Payer: Self-pay | Admitting: Internal Medicine

## 2012-01-18 ENCOUNTER — Ambulatory Visit (INDEPENDENT_AMBULATORY_CARE_PROVIDER_SITE_OTHER): Payer: BC Managed Care – PPO | Admitting: Internal Medicine

## 2012-01-18 ENCOUNTER — Encounter: Payer: Self-pay | Admitting: Internal Medicine

## 2012-01-18 VITALS — BP 122/80 | HR 78 | Temp 99.3°F | Resp 16 | Wt 240.0 lb

## 2012-01-18 DIAGNOSIS — F3289 Other specified depressive episodes: Secondary | ICD-10-CM

## 2012-01-18 DIAGNOSIS — E291 Testicular hypofunction: Secondary | ICD-10-CM

## 2012-01-18 DIAGNOSIS — F329 Major depressive disorder, single episode, unspecified: Secondary | ICD-10-CM

## 2012-01-18 DIAGNOSIS — E669 Obesity, unspecified: Secondary | ICD-10-CM

## 2012-01-18 DIAGNOSIS — E785 Hyperlipidemia, unspecified: Secondary | ICD-10-CM

## 2012-01-18 NOTE — Progress Notes (Signed)
Subjective:     Patient ID: James [name], male   DOB: [birth year], 61 y.o.   MRN: [id number]  HPI Comments: James [name] is a 61 yo male with a history of HLD, obesity, and erectile dysfunction who has come to clinic today for follow up on lab results. His last Lipid panel in June 2013 showed an LD of 199 and a total cholesterol of 269. His last HA1C was 6.2 in June 2013. He reported that he stopped taking his Lipitor because it was causing him to have erectile dysfunction. I counseled him about the importance of lowering his cholesterol and the effects of high LDL on his health. We spoke about alternatives to statin medications. He stated he did not want to be on any medications for his cholesterol and wanted to try lifestyle changes first. He has also stopped taking his Effexor 10 days ago and is experiencing withdrawal symptoms that include vivid dreams and vision disturbances when he turns his head to either side. He states that he does not feel depressed anymore and stopped taking his medication because he forgot to take it for two days.  He had also inquired about his low testosterone and erectile dysfunction. I consoled him on testosterone replacement therapy and briefed him on the potential benefits and side effects. He decided to try lifestyle changes such as exercise rather than medical therapy.  Finally, James [name] mentioned swelling and pain in his left and right legs. He stated the left leg usually swells, but the right leg does not usually experience swelling. This swelling gets worse as the day goes on. He States that he has pain that radiated from the top of his posterior hip and radiates down the back of his left leg. He stated that the pain can get to be severe. He states that the pain is also in his left hip. He takes naproxen at night occasionally to fall asleep. He denies numbness in his feet. He reported that it can be difficult to exercise because of this pain.  Past Medical History    Diagnosis Date  . Wrist pain, left   . Back pain   . Depressive disorder, not elsewhere classified   . Fasting hyperglycemia   . Special screening for malignant neoplasm of prostate   . Routine general medical examination at a health care facility   . Spinal stenosis, unspecified region other than cervical   . GERD (gastroesophageal reflux disease)   . Colon polyps     10/27/2002, repeat letter 09/17/2007  . Other and unspecified hyperlipidemia   . MPN (myeloproliferative neoplasm)     1st detected 06/04/1998  . Hemoptysis     abnormal CT Chest 01/29/10 - ? new GG changes RUL > not viz on plain cxr 02/26/2010  . Positive PPD     per pt  . Obesity    Past Surgical History  Procedure Date  . Tonsillectomy   . Anterior cruciate ligament repair     LEFT at 15  . Wrist surgery 12/2009    Reconstruction, [name]   Family History  Problem Relation Age of Onset  . Heart disease Father   . Heart disease Paternal Uncle   . Prostate cancer Neg Hx   . Colon cancer Neg Hx   . Hypertension Neg Hx   . Hyperlipidemia Neg Hx   . Diabetes Neg Hx    History   Social History  . Marital Status: Married    Spouse Name: N/A  Number of Children: 3  . Years of Education: N/A   Occupational History  . Retired, Multimedia programmer   . Retired, Real estate     Slow   Social History Main Topics  . Smoking status: Never Smoker   . Smokeless tobacco: Never Used  . Alcohol Use: 0.6 oz/week    1 Cans of beer per week     Socially   . Drug Use: No  . Sexually Active: Yes -- Male partner(s)   Other Topics Concern  . Not on file   Social History Narrative   HS[employer], [employer]. Married '73.  2 sons - '74,   '76;  1 daughter -  '77  6 grandchildren.Work - Teacher, music now retired. ACP - not fully discussed.     Current Outpatient Prescriptions on File Prior to Visit  Medication Sig Dispense Refill  . cyclobenzaprine (FLEXERIL) 5 MG tablet Take 5-10 mg by mouth  every 8 (eight) hours as needed.        . famotidine (PEPCID AC) 10 MG chewable tablet Chew 10 mg by mouth daily as needed.        . Multiple Vitamins-Minerals (MULTIVITAMIN,TX-MINERALS) tablet Take 1 tablet by mouth daily.           Review of Systems  All other systems reviewed and are negative.       Objective:   Physical Exam  Nursing note and vitals reviewed. Constitutional: He is oriented to person, place, and time. No distress.       Obese  Neck: Normal range of motion.  Cardiovascular: Normal rate, regular rhythm and normal heart sounds.  Exam reveals no gallop and no friction rub.   No murmur heard.      2+ radial pulses. 1+ pedal pulses. No carotid bruits  Pulmonary/Chest: Effort normal and breath sounds normal. No respiratory distress. He has no wheezes. He has no rales.  Musculoskeletal: Normal range of motion. He exhibits edema (2+ pitting edema in left leg and foot up to knee. 2+ pitting edema in right leg and foot extending to knee.). He exhibits no tenderness.       5/5 Strength in all extremities. Grip strength stronger on right than left.  Neurological: He is alert and oriented to person, place, and time.  Skin: Skin is warm. He is not diaphoretic.       The skin on his right and left legs was glossy appearing. Surgical scar over lateral portion of right wrist that is healed.  Psychiatric: He has a normal mood and affect. His behavior is normal. Judgment and thought content normal.   Filed Vitals:   01/18/12 1526  BP: 122/80  Pulse: 78  Temp: 99.3 F (37.4 C)  Resp: 16        Assessment & Plan:     1. Metabolic Syndrome - Given his HLD in the setting of a HA1C of 6.2 and obesity this patient has metabolic syndrome. The patient chooses not to be managed on medication at this time.   PLAN - Patient will try to implement lifestyle changes such as portion control and exercise. 2. Erectile Dysfunction - Patient has low testosterone likely a side effect of  Lipitor. He choses not to undergo testosterone replacement at this time.  PLAN - No treatment to offer.  3. Venous Insufficiency - Likely given leg swelling in the absence of CHF and abnormal kidney function  PLAN - Instruct patient on ACE bandage and compression stocking  use. 4. Pain in Left Hip Radiating down Left Leg to Foot - Likely piriformis syndrome given history.  PLAN - Educate patient about home stretching regimen to relieve pain and improve symptoms.  Refer to physical therapy if PT needs additional help.     Attending note: patient interviewed and examined. Agree with the above information from James [name], James [name]. James [name] is not diabetic but has a borderline A1C. He feels 100% better since stopping the lipitor and does not want to restart any "statin" medications. He plans on following a low fat, low carb diet, to exercise and loose weight. He reports his biggest problem is left leg pain and he ponts to the area over the greater trochanter and has decrease internal rotation of the hip.  I agree with James [name] plan - pat is instructed to use YouTube as a resource for exercise and stretches for his hip, possible piriformis syndrome and reduced ROM. He is also provided with an Rx for [facility] for professional PT help. He will return in 3-4 months for lab.

## 2012-01-18 NOTE — Patient Instructions (Addendum)
1. Metabolic Syndrome - Your HA1C and cholesterol were both high on last exam which suggests metabolic syndrome. PLAN - Implement lifestyle changes such as dieting with portion control and exercise. Please follow-up with the clinic in December for re-evaluation of your lipids.  2. Erectile Dysfunction - Your low testosterone is likely a side effect of Lipitor. PLAN - Stop taking the Lipitor and implement lifestyle changes such as dieting with portion control and exercise. We can check your testosterone at your next visit if you like. If you choose to undergo testosterone replacement therapy at any time, please contact the clinic to arrange a visit.   3. Venous Insufficiency - Your legs are swollen from a mechanical issue with the venous system. PLAN - I attached an educational document. You can use ACE stockings to control the swelling.  4. Pain in Left Hip Radiating down Left Leg to Foot - Likely piriformis syndrome given history.  PLAN - look up "piriformis syndrome exercises" on youtube and practice these stretches at home. If you need additional help and your symptoms are not resolving, please seek help from physical therapy.   Venous Stasis and Chronic Venous Insufficiency As people age, the veins located in their legs may weaken and stretch. When veins weaken and lose the ability to pump blood effectively, the condition is called chronic venous insufficiency (CVI) or venous stasis. Almost all veins return blood back to the heart. This happens by:  The force of the heart pumping fresh blood pushes blood back to the heart.   Blood flowing to the heart from the force of gravity.  In the deep veins of the legs, blood has to fight gravity and flow upstream back to the heart. Here, the leg muscles contract to pump blood back toward the heart. Vein walls are elastic, and many veins have small valves that only allow blood to flow in one direction. When leg muscles contract, they push inward against  the elastic vein walls. This squeezes blood upward, opens the valves, and moves blood toward the heart. When leg muscles relax, the vein wall also relaxes and the valves inside the vein close to prevent blood from flowing backward. This method of pumping blood out of the legs is called the venous pump. CAUSES   The venous pump works best while walking and leg muscles are contracting. But when a person sits or stands, blood pressure in leg veins can build. Deep veins are usually able to withstand short periods of inactivity, but long periods of inactivity (and increased pressure) can stretch, weaken, and damage vein walls. High blood pressure can also stretch and damage vein walls. The veins may no longer be able to pump blood back to the heart. Venous hypertension (high blood pressure inside veins) that lasts over time is a primary cause of CVI. CVI can also be caused by:    Deep vein thrombosis, a condition where a thrombus (blood clot) blocks blood flow in a vein.   Phlebitis, an inflammation of a superficial vein that causes a blood clot to form.  Other risk factors for CVI may include:    Heredity.   Obesity.   Pregnancy.   Sedentary lifestyle.   Smoking.   Jobs requiring long periods of standing or sitting in one place.   Age and gender:   Women in their 43's and 23's and men in their 62's are more prone to developing CVI.  SYMPTOMS   Symptoms of CVI may include:    Varicose veins.  Ulceration or skin breakdown.   Lipodermatosclerosis, a condition that affects the skin just above the ankle, usually on the inside surface.  Over time the skin becomes brown, smooth, tight and often painful. Those with this condition have a high risk of developing skin ulcers.   Reddened or discolored skin on the leg.   Swelling.  DIAGNOSIS   Your caregiver can diagnose CVI after performing a careful medical history and physical examination. To confirm the diagnosis, the following tests may  also be ordered:    Duplex ultrasound.   Plethysmography (tests blood flow).   Venograms (x-ray using a special dye).  TREATMENT The goals of treatment for CVI are to restore a person to an active life and to minimize pain or disability. Typically, CVI does not pose a serious threat to life or limb, and with proper treatment most people with this condition can continue to lead active lives. In most cases, mild CVI can be treated on an outpatient basis with simple procedures. Treatment methods include:    Elastic compression socks.   Sclerotherapy, a procedure involving an injection of a material that "dissolves" the damaged veins. Other veins in the network of blood vessels take over the function of the damaged veins.   Vein stripping (an older procedure less commonly used).   Laser Ablation surgery.   Valve repair.  HOME CARE INSTRUCTIONS    Elastic compression socks must be worn every day. They can help with symptoms and lower the chances of the problem getting worse, but they do not cure the problem.   Only take over-the-counter or prescription medicines for pain, discomfort, or fever as directed by your caregiver.   Your caregiver will review your other medications with you.  SEEK MEDICAL CARE IF:    You are confused about how to take your medications.   There is redness, swelling, or increasing pain in the affected area.   There is a red streak or line that extends up or down from the affected area.   There is a breakdown or loss of skin in the affected area, even if the breakdown is small.   You develop an unexplained oral temperature above 102 F (38.9 C).   There is an injury to the affected area.  SEEK IMMEDIATE MEDICAL CARE IF:    There is an injury and open wound to the affected area.   Pain is not adequately relieved with pain medication prescribed or becomes severe.   An oral temperature above 102 F (38.9 C) develops.   The foot/ankle below the affected  area becomes suddenly numb or the area feels weak and hard to move.  MAKE SURE YOU:    Understand these instructions.   Will watch your condition.   Will get help right away if you are not doing well or get worse.  Document Released: 08/31/2006 Document Revised: 04/16/2011 Document Reviewed: 11/08/2006 [facility] Patient Information 2012 [place], [place].Metabolic Syndrome, Adult Metabolic syndrome descibes a group of risk factors for heart disease and diabetes. This syndrome has other names including Insulin Resistance Syndrome. The more risk factors you have, the higher your risk of having a heart attack, stroke, or developing diabetes. These risk factors include:  High blood sugar.   High blood triglyceride (a fat found in the blood) level.   High blood pressure.   Abdominal obesity (your extra weight is around your waist instead of your hips).   Low levels of high-density lipoprotein, HDL (good blood cholesterol).  If you have  any three of these risk factors, you have metabolic syndrome. If you have even one of these factors, you should make lifestyle changes to improve your health in order to prevent serious health diseases.   In people with metabolic syndrome, the cells do not respond properly to insulin. This can lead to high levels of glucose in the blood, which can interfere with normal body processes. Eventually, this can cause high blood pressure and higher fat levels in the blood, and inflammation of your blood vessels. The result can be heart disease and stroke.   CAUSES    Eating a diet rich in calories and saturated fat.   Too little physical activity.   Being overweight.  Other underlying causes are:  Family history (genetics).   Ethnicity (South Asians are at a higher risk).   Older age (your chances of developing metabolic syndrome are higher as you grow older).   Insulin resistance.  SYMPTOMS   By itself, metabolic syndrome has no symptoms. However, you might  have symptoms of diabetes (high blood sugar) or high blood pressure, such as:  Increased thirst, urination, and tiredness.   Dizzy spells.   Dull headaches that are unusual for you.   Blurred vision.   Nosebleeds.  DIAGNOSIS   Your caregiver may make a diagnosis of metabolic syndrome if you have at least three of these factors:  If you are overweight mostly around the waist. This means a waistline greater than 40" in men and more than 35" in women. The waistline limits are 31 to 35 inches for women and 37 to 39 inches for men. In those who have certain genetic risk factors, such as having a family history of diabetes or being of Asian descent.   If you have a blood pressure of 130/85 mm Hg or more, or if you are being treated for high blood pressure.   If your blood triglyceride level is 150 mg/dL or more, or you are being treated for high levels of triglyceride.   If the level of HDL in your blood is below 40 mg/dL in men, less than 50 mg/dL in women, or you are receiving treatment for low levels of HDL.   If the level of sugar in your blood is high with fasting blood sugar level of 110 mg/dL or more, or you are under treatment for diabetes.  TREATMENT   Your caregiver may have you make lifestyle changes, which may include:  Exercise.   Losing weight.   Maintaining a healthy diet.   Quitting smoking.  The lifestyle changes listed above are key in reducing your risk for heart disease and stroke. Medicines may also be prescribed to help your body respond to insulin better and to reduce your blood pressure and blood fat levels. Aspirin may be recommended to reduce risks of heart disease or stroke.   HOME CARE INSTRUCTIONS    Exercise.   Measure your waist at regular intervals just above the hipbones after you have breathed out.   Maintain a healthy diet.   Eat fruits, such as apples, oranges, and pears.   Eat vegetables.   Eat legumes, such as kidney beans, peas, and  lentils.   Eat food rich in soluble fiber, such as whole grain cereal, oatmeal, and oat bran.   Use olive or safflower oils and avoid saturated fats.   Eat nuts.   Limit the amount of salt you eat or add to food.   Limit the amount of alcohol you drink.  Include fish in your diet, if possible.   Stop smoking if you are a smoker.   Maintain regular follow-up appointments.   Follow your caregiver's advice.  SEEK MEDICAL CARE IF:    You feel very tired or fatigued.   You develop excessive thirst.   You pass large quantities of urine.   You are putting on weight around your waist rather than losing weight.   You develop headaches over and over again.   You have off-and-on dizzy spells.  SEEK IMMEDIATE MEDICAL CARE IF:    You develop nosebleeds.   You develop sudden blurred vision.   You develop sudden dizzy spells.   You develop chest pains, trouble breathing, or feel an abnormal or irregular heart beat.   You have a fainting episode.   You develop any sudden trouble speaking and/or swallowing.   You develop sudden weakness in one arm and/or one leg.  MAKE SURE YOU:    Understand these instructions.   Will watch your condition.   Will get help right away if you are not doing well or get worse.  Document Released: 08/04/2007 Document Revised: 04/16/2011 Document Reviewed: 08/04/2007 [facility] Patient Information 2012 [place], [place].Piriformis Syndrome Piriformis syndrome is a rare neuromuscular disorder. It happens when the piriformis muscle compresses or irritates the sciatic nerve. This is the largest nerve in the body. The piriformis muscle is a narrow muscle. It is located in the buttocks.   SYMPTOMS   Compression of the sciatic nerve causes pain. It is often described as tingling or numbness:  In the buttocks.   Along the nerve.   Down to the leg.  The pain may get worse as a result of:  Sitting for a long period of time.   Climbing stairs.    Walking or running.  TREATMENT   Generally, treatment for the disorder begins with stretching exercises and massage. Anti-inflammatory drugs may be prescribed. A patient may be advised to stop running, bicycling, or similar activities. A corticosteroid injection near where the piriformis muscle and the sciatic nerve meet may provide temporary relief. In some cases, surgery is recommended. The outcome for most individuals with this syndrome is good. Once symptoms of the disorder are addressed, individuals can usually resume their normal activities. In some cases, exercise regimens may need to be modified. This is in order to reduce the likelihood of recurrence or worsening. Document Released: 04/17/2002 Document Revised: 04/16/2011 Document Reviewed: 04/27/2005 [facility] Patient Information 2012 [place], [place].Metabolic Syndrome, Adult Metabolic syndrome descibes a group of risk factors for heart disease and diabetes. This syndrome has other names including Insulin Resistance Syndrome. The more risk factors you have, the higher your risk of having a heart attack, stroke, or developing diabetes. These risk factors include:  High blood sugar.   High blood triglyceride (a fat found in the blood) level.   High blood pressure.   Abdominal obesity (your extra weight is around your waist instead of your hips).   Low levels of high-density lipoprotein, HDL (good blood cholesterol).  If you have any three of these risk factors, you have metabolic syndrome. If you have even one of these factors, you should make lifestyle changes to improve your health in order to prevent serious health diseases.   In people with metabolic syndrome, the cells do not respond properly to insulin. This can lead to high levels of glucose in the blood, which can interfere with normal body processes. Eventually, this can cause high blood pressure and higher fat  levels in the blood, and inflammation of your blood vessels. The  result can be heart disease and stroke.   CAUSES    Eating a diet rich in calories and saturated fat.   Too little physical activity.   Being overweight.  Other underlying causes are:  Family history (genetics).   Ethnicity (South Asians are at a higher risk).   Older age (your chances of developing metabolic syndrome are higher as you grow older).   Insulin resistance.  SYMPTOMS   By itself, metabolic syndrome has no symptoms. However, you might have symptoms of diabetes (high blood sugar) or high blood pressure, such as:  Increased thirst, urination, and tiredness.   Dizzy spells.   Dull headaches that are unusual for you.   Blurred vision.   Nosebleeds.  DIAGNOSIS   Your caregiver may make a diagnosis of metabolic syndrome if you have at least three of these factors:  If you are overweight mostly around the waist. This means a waistline greater than 40" in men and more than 35" in women. The waistline limits are 31 to 35 inches for women and 37 to 39 inches for men. In those who have certain genetic risk factors, such as having a family history of diabetes or being of Asian descent.   If you have a blood pressure of 130/85 mm Hg or more, or if you are being treated for high blood pressure.   If your blood triglyceride level is 150 mg/dL or more, or you are being treated for high levels of triglyceride.   If the level of HDL in your blood is below 40 mg/dL in men, less than 50 mg/dL in women, or you are receiving treatment for low levels of HDL.   If the level of sugar in your blood is high with fasting blood sugar level of 110 mg/dL or more, or you are under treatment for diabetes.  TREATMENT   Your caregiver may have you make lifestyle changes, which may include:  Exercise.   Losing weight.   Maintaining a healthy diet.   Quitting smoking.  The lifestyle changes listed above are key in reducing your risk for heart disease and stroke. Medicines may also be  prescribed to help your body respond to insulin better and to reduce your blood pressure and blood fat levels. Aspirin may be recommended to reduce risks of heart disease or stroke.   HOME CARE INSTRUCTIONS    Exercise.   Measure your waist at regular intervals just above the hipbones after you have breathed out.   Maintain a healthy diet.   Eat fruits, such as apples, oranges, and pears.   Eat vegetables.   Eat legumes, such as kidney beans, peas, and lentils.   Eat food rich in soluble fiber, such as whole grain cereal, oatmeal, and oat bran.   Use olive or safflower oils and avoid saturated fats.   Eat nuts.   Limit the amount of salt you eat or add to food.   Limit the amount of alcohol you drink.   Include fish in your diet, if possible.   Stop smoking if you are a smoker.   Maintain regular follow-up appointments.   Follow your caregiver's advice.  SEEK MEDICAL CARE IF:    You feel very tired or fatigued.   You develop excessive thirst.   You pass large quantities of urine.   You are putting on weight around your waist rather than losing weight.   You develop headaches over  and over again.   You have off-and-on dizzy spells.  SEEK IMMEDIATE MEDICAL CARE IF:    You develop nosebleeds.   You develop sudden blurred vision.   You develop sudden dizzy spells.   You develop chest pains, trouble breathing, or feel an abnormal or irregular heart beat.   You have a fainting episode.   You develop any sudden trouble speaking and/or swallowing.   You develop sudden weakness in one arm and/or one leg.  MAKE SURE YOU:    Understand these instructions.   Will watch your condition.   Will get help right away if you are not doing well or get worse.  Document Released: 08/04/2007 Document Revised: 04/16/2011 Document Reviewed: 08/04/2007 [facility] Patient Information 2012 [place], [place].

## 2012-01-19 NOTE — Assessment & Plan Note (Signed)
Very high cholesterol levels. He is aware of the implications and risks associated with untreated hyperlipidemia.   Plan - life style management with repeat labs in 3 months

## 2012-01-19 NOTE — Assessment & Plan Note (Signed)
Patient plans on life-style management: smart food choices, portion size restriction and exercise.

## 2012-01-19 NOTE — Assessment & Plan Note (Signed)
He is feeling better but had a rough time initially having suddenly stopped effexor. He does not wish to consider any medical therapy at this time.

## 2012-01-19 NOTE — Assessment & Plan Note (Signed)
May be due to lipitor.  Plan-no medical intervention at this time.

## 2012-03-31 ENCOUNTER — Encounter: Payer: Self-pay | Admitting: Internal Medicine

## 2012-03-31 ENCOUNTER — Ambulatory Visit (INDEPENDENT_AMBULATORY_CARE_PROVIDER_SITE_OTHER): Payer: BC Managed Care – PPO | Admitting: Internal Medicine

## 2012-03-31 VITALS — BP 120/62 | HR 68 | Temp 98.7°F | Resp 10 | Wt 237.0 lb

## 2012-03-31 DIAGNOSIS — J069 Acute upper respiratory infection, unspecified: Secondary | ICD-10-CM

## 2012-03-31 MED ORDER — PROMETHAZINE-CODEINE 6.25-10 MG/5ML PO SYRP: 5.0000 mL | ORAL_SOLUTION | ORAL | Status: DC | PRN

## 2012-03-31 MED ORDER — AZITHROMYCIN 250 MG PO TABS: ORAL_TABLET | ORAL | Status: DC

## 2012-03-31 NOTE — Patient Instructions (Addendum)
Upper respiratory infection - lungs are clear.  Plan  z-pak as directed - antibiotic  Promethazine with codeine syrup: 1 tsp every 4-6 hours as needed, may adjust the dose if it makes you too drowsy  Hydrate  Tylenol for aches or fever  Vitamin C 1500 mg daily  For sinus congestion: Sudafed (generic) 30 mg two or three times a day  Call if you don't get better.   Upper Respiratory Infection, Adult An upper respiratory infection (URI) is also sometimes known as the common cold. The upper respiratory tract includes the nose, sinuses, throat, trachea, and bronchi. Bronchi are the airways leading to the lungs. Most people improve within 1 week, but symptoms can last up to 2 weeks. A residual cough may last even longer.   CAUSES Many different viruses can infect the tissues lining the upper respiratory tract. The tissues become irritated and inflamed and often become very moist. Mucus production is also common. A cold is contagious. You can easily spread the virus to others by oral contact. This includes kissing, sharing a glass, coughing, or sneezing. Touching your mouth or nose and then touching a surface, which is then touched by another person, can also spread the virus. SYMPTOMS   Symptoms typically develop 1 to 3 days after you come in contact with a cold virus. Symptoms vary from person to person. They may include:  Runny nose.   Sneezing.   Nasal congestion.   Sinus irritation.   Sore throat.   Loss of voice (laryngitis).   Cough.   Fatigue.   Muscle aches.   Loss of appetite.   Headache.   Low-grade fever.  DIAGNOSIS   You might diagnose your own cold based on familiar symptoms, since most people get a cold 2 to 3 times a year. Your caregiver can confirm this based on your exam. Most importantly, your caregiver can check that your symptoms are not due to another disease such as strep throat, sinusitis, pneumonia, asthma, or epiglottitis. Blood tests, throat tests, and  X-rays are not necessary to diagnose a common cold, but they may sometimes be helpful in excluding other more serious diseases. Your caregiver will decide if any further tests are required. RISKS AND COMPLICATIONS   You may be at risk for a more severe case of the common cold if you smoke cigarettes, have chronic heart disease (such as heart failure) or lung disease (such as asthma), or if you have a weakened immune system. The very young and very old are also at risk for more serious infections. Bacterial sinusitis, middle ear infections, and bacterial pneumonia can complicate the common cold. The common cold can worsen asthma and chronic obstructive pulmonary disease (COPD). Sometimes, these complications can require emergency medical care and may be life-threatening. PREVENTION   The best way to protect against getting a cold is to practice good hygiene. Avoid oral or hand contact with people with cold symptoms. Wash your hands often if contact occurs. There is no clear evidence that vitamin C, vitamin E, echinacea, or exercise reduces the chance of developing a cold. However, it is always recommended to get plenty of rest and practice good nutrition. TREATMENT   Treatment is directed at relieving symptoms. There is no cure. Antibiotics are not effective, because the infection is caused by a virus, not by bacteria. Treatment may include:  Increased fluid intake. Sports drinks offer valuable electrolytes, sugars, and fluids.   Breathing heated mist or steam (vaporizer or shower).   Eating chicken  soup or other clear broths, and maintaining good nutrition.   Getting plenty of rest.   Using gargles or lozenges for comfort.   Controlling fevers with ibuprofen or acetaminophen as directed by your caregiver.   Increasing usage of your inhaler if you have asthma.  Zinc gel and zinc lozenges, taken in the first 24 hours of the common cold, can shorten the duration and lessen the severity of symptoms.  Pain medicines may help with fever, muscle aches, and throat pain. A variety of non-prescription medicines are available to treat congestion and runny nose. Your caregiver can make recommendations and may suggest nasal or lung inhalers for other symptoms.   HOME CARE INSTRUCTIONS    Only take over-the-counter or prescription medicines for pain, discomfort, or fever as directed by your caregiver.   Use a warm mist humidifier or inhale steam from a shower to increase air moisture. This may keep secretions moist and make it easier to breathe.   Drink enough water and fluids to keep your urine clear or pale yellow.   Rest as needed.   Return to work when your temperature has returned to normal or as your caregiver advises. You may need to stay home longer to avoid infecting others. You can also use a face mask and careful hand washing to prevent spread of the virus.  SEEK MEDICAL CARE IF:    After the first few days, you feel you are getting worse rather than better.   You need your caregiver's advice about medicines to control symptoms.   You develop chills, worsening shortness of breath, or brown or red sputum. These may be signs of pneumonia.   You develop yellow or brown nasal discharge or pain in the face, especially when you bend forward. These may be signs of sinusitis.   You develop a fever, swollen neck glands, pain with swallowing, or white areas in the back of your throat. These may be signs of strep throat.  SEEK IMMEDIATE MEDICAL CARE IF:    You have a fever.   You develop severe or persistent headache, ear pain, sinus pain, or chest pain.   You develop wheezing, a prolonged cough, cough up blood, or have a change in your usual mucus (if you have chronic lung disease).   You develop sore muscles or a stiff neck.  Document Released: 10/21/2000 Document Revised: 07/20/2011 Document Reviewed: 08/29/2010 [facility] Patient Information 2013 [place], [place].

## 2012-03-31 NOTE — Progress Notes (Signed)
Subjective:    Patient ID: James [name], male    DOB: Jan 15, 1951, 61 y.o.   MRN: [id number]  HPI James [name] presents f9o an illness that started about a month ago: felt feverish, had a productive cough, sore throat, generalized weakness - homebound for a weak and gradually got better. This Monday he had recurrent symptoms: sinus congestion, cough - productive creamy thick mucus, substernal type irritation. Generalized myalgias. No n/v/d, short of breath associated with coughing.   Past Medical History  Diagnosis Date  . Wrist pain, left   . Back pain   . Depressive disorder, not elsewhere classified   . Fasting hyperglycemia   . Special screening for malignant neoplasm of prostate   . Routine general medical examination at a health care facility   . Spinal stenosis, unspecified region other than cervical   . GERD (gastroesophageal reflux disease)   . Colon polyps     10/27/2002, repeat letter 09/17/2007  . Other and unspecified hyperlipidemia   . MPN (myeloproliferative neoplasm)     1st detected 06/04/1998  . Hemoptysis     abnormal CT Chest 01/29/10 - ? new GG changes RUL > not viz on plain cxr 02/26/2010  . Positive PPD     per pt  . Obesity    Past Surgical History  Procedure Date  . Tonsillectomy   . Anterior cruciate ligament repair     LEFT at 15  . Wrist surgery 12/2009    Reconstruction, [name]   Family History  Problem Relation Age of Onset  . Heart disease Father   . Heart disease Paternal Uncle   . Prostate cancer Neg Hx   . Colon cancer Neg Hx   . Hypertension Neg Hx   . Hyperlipidemia Neg Hx   . Diabetes Neg Hx    History   Social History  . Marital Status: Married    Spouse Name: N/A    Number of Children: 3  . Years of Education: N/A   Occupational History  . Retired, Multimedia programmer   . Retired, Real estate     Slow   Social History Main Topics  . Smoking status: Never Smoker   . Smokeless tobacco: Never Used  . Alcohol Use: 0.6 oz/week    1  Cans of beer per week     Comment: Socially   . Drug Use: No  . Sexually Active: Yes -- Male partner(s)   Other Topics Concern  . Not on file   Social History Narrative   HS[employer], [employer]. Married '73.  2 sons - '74,   '76;  1 daughter -  '77  6 grandchildren.Work - Teacher, music now retired. ACP - not fully discussed.     Current Outpatient Prescriptions on File Prior to Visit  Medication Sig Dispense Refill  . cyclobenzaprine (FLEXERIL) 5 MG tablet Take 5-10 mg by mouth every 8 (eight) hours as needed.        . famotidine (PEPCID AC) 10 MG chewable tablet Chew 10 mg by mouth daily as needed.        . Multiple Vitamins-Minerals (MULTIVITAMIN,TX-MINERALS) tablet Take 1 tablet by mouth daily.            Review of Systems System review is negative for any constitutional, cardiac, pulmonary, GI or neuro symptoms or complaints other than as described in the HPI.     Objective:   Physical Exam Filed Vitals:   03/31/12 1107  BP: 120/62  Pulse: 68  Temp: 98.7 F (37.1 C)  Resp: 10   Gen'l- overweight white man in no distress HEENT_ TMs normal, throat clear, no acute tenderness to percussion over the frontal and maxillary sinus Neck - supple Nodes - enlarged left submandibular node that is tender Chest - clear       Assessment & Plan:  Upper respiratory infection - lungs are clear.  Plan  z-pak as directed - antibiotic  Promethazine with codeine syrup: 1 tsp every 4-6 hours as needed, may adjust the dose if it makes you too drowsy  Hydrate  Tylenol for aches or fever  Vitamin C 1500 mg daily  For sinus congestion: Sudafed (generic) 30 mg two or three times a day  Call if you don't get better.

## 2012-07-14 ENCOUNTER — Telehealth: Payer: Self-pay | Admitting: *Deleted

## 2012-07-14 DIAGNOSIS — E291 Testicular hypofunction: Secondary | ICD-10-CM

## 2012-07-14 NOTE — Telephone Encounter (Signed)
Pt called stating he missed going to the lab back in December. He apologized for this. Pt asked if Dr Debby Bud would put in an order for him to have his lipid panel and testosterone blood work done , the week prior to his appt on April 15th? Please advise.

## 2012-07-14 NOTE — Telephone Encounter (Signed)
Orders are on file - may come in at any time

## 2012-07-18 NOTE — Telephone Encounter (Signed)
Please read note below and return call to pt.

## 2012-07-18 NOTE — Telephone Encounter (Signed)
LMOM that order has been put in, advised to call if any problems

## 2012-08-23 ENCOUNTER — Encounter: Payer: BC Managed Care – PPO | Admitting: Internal Medicine

## 2012-09-12 ENCOUNTER — Ambulatory Visit (INDEPENDENT_AMBULATORY_CARE_PROVIDER_SITE_OTHER)
Admission: RE | Admit: 2012-09-12 | Discharge: 2012-09-12 | Disposition: A | Discharge status: Home / Self Care | Payer: BC Managed Care – PPO | Source: Ambulatory Visit | Attending: Internal Medicine | Admitting: Internal Medicine

## 2012-09-12 ENCOUNTER — Encounter: Payer: Self-pay | Admitting: Internal Medicine

## 2012-09-12 ENCOUNTER — Other Ambulatory Visit (INDEPENDENT_AMBULATORY_CARE_PROVIDER_SITE_OTHER): Payer: BC Managed Care – PPO

## 2012-09-12 ENCOUNTER — Ambulatory Visit (INDEPENDENT_AMBULATORY_CARE_PROVIDER_SITE_OTHER): Payer: BC Managed Care – PPO | Admitting: Internal Medicine

## 2012-09-12 VITALS — BP 130/78 | HR 68 | Temp 99.0°F | Resp 19 | Ht 69.0 in | Wt 239.0 lb

## 2012-09-12 DIAGNOSIS — R7309 Other abnormal glucose: Secondary | ICD-10-CM

## 2012-09-12 DIAGNOSIS — R590 Localized enlarged lymph nodes: Secondary | ICD-10-CM

## 2012-09-12 DIAGNOSIS — R599 Enlarged lymph nodes, unspecified: Secondary | ICD-10-CM

## 2012-09-12 DIAGNOSIS — J984 Other disorders of lung: Secondary | ICD-10-CM

## 2012-09-12 LAB — CBC WITH DIFFERENTIAL/PLATELET
Basophils Absolute: 0 10*3/uL (ref 0.0–0.1)
Basophils Relative: 0.4 % (ref 0.0–3.0)
Eosinophils Absolute: 0.1 10*3/uL (ref 0.0–0.7)
Eosinophils Relative: 1.9 % (ref 0.0–5.0)
HCT: 46.9 % (ref 39.0–52.0)
Hemoglobin: 16.4 g/dL (ref 13.0–17.0)
Lymphocytes Relative: 21.1 % (ref 12.0–46.0)
Lymphs Abs: 1.3 10*3/uL (ref 0.7–4.0)
MCHC: 34.9 g/dL (ref 30.0–36.0)
MCV: 90 fl (ref 78.0–100.0)
Monocytes Absolute: 0.5 10*3/uL (ref 0.1–1.0)
Monocytes Relative: 8.5 % (ref 3.0–12.0)
Neutro Abs: 4.4 10*3/uL (ref 1.4–7.7)
Neutrophils Relative %: 68.1 % (ref 43.0–77.0)
Platelets: 183 10*3/uL (ref 150.0–400.0)
RBC: 5.21 Mil/uL (ref 4.22–5.81)
RDW: 13.5 % (ref 11.5–14.6)
WBC: 6.4 10*3/uL (ref 4.5–10.5)

## 2012-09-12 LAB — HEMOGLOBIN A1C: Hgb A1c MFr Bld: 6.3 % (ref 4.6–6.5)

## 2012-09-12 LAB — SEDIMENTATION RATE: Sed Rate: 6 mm/hr (ref 0–22)

## 2012-09-12 MED ORDER — IOHEXOL 300 MG/ML  SOLN
80.0000 mL | Freq: Once | INTRAMUSCULAR | Status: AC | PRN
  Administered 2012-09-12: 80 mL via INTRAVENOUS

## 2012-09-12 NOTE — Patient Instructions (Signed)
Lymphadenopathy  Lymphadenopathy means "disease of the lymph glands." But the term is usually used to describe swollen or enlarged lymph glands, also called lymph nodes. These are the bean-shaped organs found in many locations including the neck, underarm, and groin. Lymph glands are part of the immune system, which fights infections in your body. Lymphadenopathy can occur in just one area of the body, such as the neck, or it can be generalized, with lymph node enlargement in several areas. The nodes found in the neck are the most common sites of lymphadenopathy.  CAUSES    When your immune system responds to germs (such as viruses or bacteria ), infection-fighting cells and fluid build up. This causes the glands to grow in size. This is usually not something to worry about. Sometimes, the glands themselves can become infected and inflamed. This is called lymphadenitis.  Enlarged lymph nodes can be caused by many diseases:   Bacterial disease, such as strep throat or a skin infection.   Viral disease, such as a common cold.   Other germs, such as lyme disease, tuberculosis, or sexually transmitted diseases.   Cancers, such as lymphoma (cancer of the lymphatic system) or leukemia (cancer of the white blood cells).   Inflammatory diseases such as lupus or rheumatoid arthritis.   Reactions to medications.  Many of the diseases above are rare, but important. This is why you should see your caregiver if you have lymphadenopathy.  SYMPTOMS     Swollen, enlarged lumps in the neck, back of the head or other locations.   Tenderness.   Warmth or redness of the skin over the lymph nodes.   Fever.  DIAGNOSIS   Enlarged lymph nodes are often near the source of infection. They can help healthcare providers diagnose your illness. For instance:     Swollen lymph nodes around the jaw might be caused by an infection in the mouth.   Enlarged glands in the neck often signal a throat infection.    Lymph nodes that are swollen in more than one area often indicate an illness caused by a virus.  Your caregiver most likely will know what is causing your lymphadenopathy after listening to your history and examining you. Blood tests, x-rays or other tests may be needed. If the cause of the enlarged lymph node cannot be found, and it does not go away by itself, then a biopsy may be needed. Your caregiver will discuss this with you.  TREATMENT    Treatment for your enlarged lymph nodes will depend on the cause. Many times the nodes will shrink to normal size by themselves, with no treatment. Antibiotics or other medicines may be needed for infection. Only take over-the-counter or prescription medicines for pain, discomfort or fever as directed by your caregiver.  HOME CARE INSTRUCTIONS    Swollen lymph glands usually return to normal when the underlying medical condition goes away. If they persist, contact your health-care provider. He/she might prescribe antibiotics or other treatments, depending on the diagnosis. Take any medications exactly as prescribed. Keep any follow-up appointments made to check on the condition of your enlarged nodes.    SEEK MEDICAL CARE IF:     Swelling lasts for more than two weeks.   You have symptoms such as weight loss, night sweats, fatigue or fever that does not go away.   The lymph nodes are hard, seem fixed to the skin or are growing rapidly.   Skin over the lymph nodes is red and inflamed. This   could mean there is an infection.  SEEK IMMEDIATE MEDICAL CARE IF:     Fluid starts leaking from the area of the enlarged lymph node.   You develop a fever of 102 F (38.9 C) or greater.   Severe pain develops (not necessarily at the site of a large lymph node).   You develop chest pain or shortness of breath.   You develop worsening abdominal pain.  MAKE SURE YOU:     Understand these instructions.   Will watch your condition.    Will get help right away if you are not doing well or get worse.  Document Released: 02/04/2008 Document Revised: 07/20/2011 Document Reviewed: 02/04/2008  Exit[employer] Patient Information 2013 [employer].

## 2012-09-12 NOTE — Addendum Note (Signed)
Addended by: Etta Grandchild on: 09/12/2012 05:47 PM   Modules accepted: Orders

## 2012-09-12 NOTE — Progress Notes (Signed)
Subjective:    Patient ID: James [name], male    DOB: Oct 06, 1950, 62 y.o.   MRN: [id number]  HPI  New to me - he complains of a 3 month history of a painful lymph node over his left collar bone.  Review of Systems  Constitutional: Positive for unexpected weight change (some weight gain). Negative for fever, chills, diaphoresis, activity change, appetite change and fatigue.  HENT: Negative.   Eyes: Negative.   Respiratory: Negative.  Negative for cough, chest tightness, shortness of breath, wheezing and stridor.   Cardiovascular: Negative.  Negative for chest pain, palpitations and leg swelling.  Gastrointestinal: Negative for nausea, vomiting, abdominal pain, diarrhea, constipation, blood in stool and anal bleeding.  Endocrine: Negative.   Genitourinary: Negative.   Musculoskeletal: Negative.  Negative for myalgias, back pain, joint swelling, arthralgias and gait problem.  Skin: Negative.  Negative for color change, pallor, rash and wound.  Allergic/Immunologic: Negative.   Neurological: Negative.   Hematological: Positive for adenopathy. Does not bruise/bleed easily.  Psychiatric/Behavioral: Negative.        Objective:   Physical Exam  Vitals reviewed. Constitutional: He is oriented to person, place, and time. He appears well-developed and well-nourished. No distress.  HENT:  Head: Normocephalic and atraumatic.  Mouth/Throat: Oropharynx is clear and moist. No oropharyngeal exudate.  Eyes: Conjunctivae are normal. Right eye exhibits no discharge. Left eye exhibits no discharge. No scleral icterus.  Neck: Normal range of motion. Neck supple. No JVD present. No tracheal deviation present. No thyromegaly present.  Cardiovascular: Normal rate, regular rhythm, normal heart sounds and intact distal pulses.  Exam reveals no gallop.   No murmur heard. Pulmonary/Chest: Effort normal and breath sounds normal. No stridor. No respiratory distress. He has no wheezes. He has no rales. He  exhibits no tenderness.  Abdominal: Soft. Bowel sounds are normal. He exhibits no distension and no mass. There is no tenderness. There is no rebound and no guarding. Hernia confirmed negative in the right inguinal area and confirmed negative in the left inguinal area.  Genitourinary: Rectum normal, prostate normal, testes normal and penis normal. Rectal exam shows no external hemorrhoid, no internal hemorrhoid, no fissure, no mass, no tenderness and anal tone normal. Guaiac negative stool. Prostate is not enlarged and not tender. Right testis shows no mass, no swelling and no tenderness. Right testis is descended. Left testis shows no mass, no swelling and no tenderness. Left testis is descended. Circumcised. No penile erythema or penile tenderness. No discharge found.  Musculoskeletal: Normal range of motion. He exhibits no edema and no tenderness.  Lymphadenopathy:       Head (right side): No submental, no submandibular, no tonsillar, no preauricular, no posterior auricular and no occipital adenopathy present.       Head (left side): No submental, no submandibular, no tonsillar, no preauricular, no posterior auricular and no occipital adenopathy present.    He has no cervical adenopathy.    He has no axillary adenopathy.       Right: No inguinal, no supraclavicular and no epitrochlear adenopathy present.       Left: Supraclavicular (2x3 firm, freely mobile node palpable in the left supracalvicular fossa) adenopathy present. No inguinal and no epitrochlear adenopathy present.  Neurological: He is oriented to person, place, and time.  Skin: Skin is warm and dry. No rash noted. He is not diaphoretic. No erythema. No pallor.  Psychiatric: He has a normal mood and affect. His behavior is normal. Judgment and thought content normal.  Lab Results  Component Value Date   WBC 6.7 07/25/2010   HGB 16.2 07/25/2010   HCT 46.8 07/25/2010   PLT 209.0 07/25/2010   GLUCOSE 125* 11/04/2011   CHOL 269*  11/04/2011   TRIG 122.0 11/04/2011   HDL 40.30 11/04/2011   LDLDIRECT 199.2 11/04/2011   LDLCALC 117* 07/25/2010   ALT 44 11/04/2011   AST 29 11/04/2011   NA 141 11/04/2011   K 5.0 11/04/2011   CL 107 11/04/2011   CREATININE 1.1 11/04/2011   BUN 16 11/04/2011   CO2 26 11/04/2011   TSH 1.89 07/25/2010   PSA 0.66 11/04/2011   HGBA1C 6.2 11/04/2011       Assessment & Plan:

## 2012-09-12 NOTE — Assessment & Plan Note (Addendum)
IMPRESSION:  1. Enlarging right upper lobe ground-glass attenuation nodule suspicious for low grade adenocarcinoma. Today this measures 21 mm x 12 mm (image number 16 series 3). Consider biopsy and / or PET CT for further assessment. 2. No acute traumatic injury to the chest in this patient sustained a recent fall. 3. Stable hepatic cysts and probable left renal sinus cysts. Left hydronephrosis considered less likely given stability.   CT today shows enlarging lesion in right upper lobe - I will copy today's note to Dr. Sherene Sires

## 2012-09-12 NOTE — Assessment & Plan Note (Signed)
He has a symptomatic lymph node - I have ordered some labs to see if he has leukemia/lymphoma He has no other localizing s/s so the only xray I ordered is a CT of the check to check this node and to see if there are other areas of LAD in the chest and to look for a primary lesion in the chest

## 2012-09-12 NOTE — Assessment & Plan Note (Signed)
I will check his A1C to see if he has developed DM2 

## 2012-09-13 ENCOUNTER — Encounter: Payer: Self-pay | Admitting: Gastroenterology

## 2012-09-13 ENCOUNTER — Encounter: Payer: Self-pay | Admitting: Internal Medicine

## 2012-09-13 LAB — COMPREHENSIVE METABOLIC PANEL
ALT: 51 U/L (ref 0–53)
AST: 35 U/L (ref 0–37)
Albumin: 4.1 g/dL (ref 3.5–5.2)
Alkaline Phosphatase: 58 U/L (ref 39–117)
BUN: 16 mg/dL (ref 6–23)
CO2: 23 mEq/L (ref 19–32)
Calcium: 9.1 mg/dL (ref 8.4–10.5)
Chloride: 107 mEq/L (ref 96–112)
Creatinine, Ser: 1.2 mg/dL (ref 0.4–1.5)
GFR: 66.2 mL/min (ref >60.00)
Glucose, Bld: 127 mg/dL — ABNORMAL HIGH (ref 70–99)
Potassium: 4.3 mEq/L (ref 3.5–5.1)
Sodium: 140 mEq/L (ref 135–145)
Total Bilirubin: 0.8 mg/dL (ref 0.3–1.2)
Total Protein: 7 g/dL (ref 6.0–8.3)

## 2012-09-13 LAB — C-REACTIVE PROTEIN: CRP: <0.5 mg/dL (ref 0.5–20.0)

## 2012-09-13 LAB — TSH: TSH: 2.1 u[IU]/mL (ref 0.35–5.50)

## 2012-09-14 ENCOUNTER — Encounter: Payer: Self-pay | Admitting: Internal Medicine

## 2012-09-14 LAB — PROTEIN ELECTROPHORESIS, SERUM
Albumin ELP: 61.7 % (ref 55.8–66.1)
Alpha-1-Globulin: 4.2 % (ref 2.9–4.9)
Alpha-2-Globulin: 10.6 % (ref 7.1–11.8)
Beta 2: 4.8 % (ref 3.2–6.5)
Beta Globulin: 5.7 % (ref 4.7–7.2)
Gamma Globulin: 13 % (ref 11.1–18.8)
Total Protein, Serum Electrophoresis: 7.1 g/dL (ref 6.0–8.3)

## 2012-09-14 NOTE — Progress Notes (Signed)
Quick Note:  Spoke with pt and notified of results per Dr. Wert. Pt verbalized understanding and denied any questions.  ______ 

## 2012-09-21 ENCOUNTER — Encounter: Payer: Self-pay | Admitting: Internal Medicine

## 2012-09-21 ENCOUNTER — Ambulatory Visit (INDEPENDENT_AMBULATORY_CARE_PROVIDER_SITE_OTHER)
Admission: RE | Admit: 2012-09-21 | Discharge: 2012-09-21 | Disposition: A | Discharge status: Home / Self Care | Payer: BC Managed Care – PPO | Source: Ambulatory Visit | Attending: Internal Medicine | Admitting: Internal Medicine

## 2012-09-21 ENCOUNTER — Ambulatory Visit (INDEPENDENT_AMBULATORY_CARE_PROVIDER_SITE_OTHER): Payer: BC Managed Care – PPO | Admitting: Internal Medicine

## 2012-09-21 VITALS — BP 140/82 | HR 75 | Temp 98.8°F | Ht 67.5 in | Wt 238.8 lb

## 2012-09-21 DIAGNOSIS — J984 Other disorders of lung: Secondary | ICD-10-CM

## 2012-09-21 NOTE — Progress Notes (Signed)
Subjective:     Patient ID: James [name], male   DOB: 16-Sep-1950, 62 y.o.   MRN: [id number]  HPI  79 yowm never smoker with ? EIA but never needed an inhaler and recurrent pattern of cough on exposure to grass if dry and dusty.   February 26, 2010 1st pulmonary office eval cc cough x after Labor day when moweed the grass when dry and dusty with cough immediately then 3 days later fever then saw Norins 01/17/10 rx abx then 3 days after that started noticing traces of brb no epistaxis > CT with MPN's. Previous CT 2002 also with MPNs but not available (informed it was recycled). Presently pt feels fine x hoarseness with voice use and mild HB. rec conservative rx   08/19/10  CT c/w ? Early cavitary nodule rul >  Referred to Health Dept since reported Pos PPD but all sputums neg and repeat PPD neg as well.  10/29/2010 ov f/u repeat PPD neg and afb studies neg to date per health dep and pt has no complaints.   rec The best option is to a limited  Biopsy of the Right upper lobe to see if specific diagnosis can be made.  This is done one morning at [facility] on arriving 715 am and home around 10 am. The next best option is to do a comparison CT of the Right Upper Lobe around 1st of August- call Libby at [phone] to schedule this (limited study= limited radiation exposure) >>CT 12/17/10 A ground-glass nodule in the anterior right upper lobe measures 1.7  x 0.8 cm. In retrospect, the appearance of cavitation may have  been due to an adjacent vessel, rather than true cavitation. Size  is likely stable, when remeasured. Scattered tiny pulmonary  nodules are unchanged from 01/29/2010. Conclusion: Right upper lobe ground-glass nodule is likely stable from baseline  examination of 01/29/2010. Rec rec CT chest one year  09/21/12 Amori Cooperman/ov  Chief Complaint  Patient presents with  . Follow-up    Pt here to discuss CT Chest results from 09/14/12. He denies any co's today.    no cough, hemoptysis, cp, sob    Pt denies any significant sore throat, dysphagia, itching, sneezing,  nasal congestion or excess/ purulent secretions,  fever, chills, sweats, unintended wt loss, pleuritic or exertional cp, hempoptysis, orthopnea pnd or leg swelling.    Also denies any obvious fluctuation of symptoms with weather or environmental changes or other aggravating or alleviating factors.         Past Medical History:  WRIST PAIN, LEFT, CHRONIC (ICD-719.43)  BACK PAIN (ICD-724.5)  DEPRESSIVE DISORDER NOT ELSEWHERE CLASSIFIED (ICD-311)  OBESITY, UNSPECIFIED (ICD-278.00)  HYPERGLYCEMIA, FASTING (ICD-790.29)  SPECIAL SCREENING MALIGNANT NEOPLASM OF PROSTATE (ICD-V76.44)  ROUTINE GENERAL MEDICAL EXAM@HEALTH  CARE FACL (ICD-V70.0)  SPINAL STENOSIS (ICD-724.00)  GERD (ICD-530.81)  Colon Polyps  - 10/27/2002 , repeat rec by letter 09/17/2007  HYPERLIPIDEMIA (ICD-272.4)  MPN  - 1st detected 06/04/98  Hemoptysis with abn CT Chest 01/29/10  - ? new GG changes RUL > not viz on plain cxr February 26, 2010  - Rec  08/19/10 Ground-glass nodule in the right upper lobe has enlarged in the  interval, and now has central cavitation.  H/O Pos PPD per pt > neg per Health dept 09/2010   Family History:  father- deceased @ 13; fatal MI  Paternal uncle - deceased @ 72's; fatal MI  mother - deceased @84 ; complications of PVD with intestinal angina  Neg- colon, prostate  cancer; DM, HTN, Lipids   Social History:  [employer] BS  married '73  2 sons - '74, '76; 1 daughter '77; grandchildren 4  work: Barista and coaching- fully retired; Biomedical engineer  ETOH socially  Never smoker                  Objective:   Physical Exam  obese slt anxious amb wm nad wt 235 > 235 February 26, 2010 > 233 10/29/10 > 09/21/2012  238  pseudowheeze resolves with purse lip maneuver  HEENT: nl dentition, turbinates, and orophanx. Nl external ear canals without cough reflex  NECK : without JVD/Nodes/TM/ nl carotid  upstrokes bilaterally  LUNGS: no acc muscle use, clear to A and P bilaterally without cough on insp or exp maneuvers  CV: RRR no s3 or murmur or increase in P2, no edema  ABD: soft and nontender with nl excursion in the supine position. No bruits or organomegaly, bowel sounds nl  MS: warm without deformities, calf tenderness, cyanosis or clubbing  CXR  09/21/2012 :  no viz nodule/infiltrate RUL    Assessment:         Plan:

## 2012-09-21 NOTE — Patient Instructions (Addendum)
Please remember to go to the  x-ray department downstairs for your tests - I will call you with the results when they are available to decide next best step.

## 2012-09-22 NOTE — Assessment & Plan Note (Addendum)
-   Re CT 08/19/10 Ground-glass nodule in the right upper lobe has enlarged in the  interval, and now has central cavitation  > Referred to Health Dept since reported Pos PPD but all sputums neg and repeat PPD neg as well - ReCT 12/16/12 Right upper lobe ground-glass nodule is likely stable from baseline  examination of 01/29/2010. rec re CT one year > not done - 09/12/12 CT Enlargement of the a right upper lobe ground-glass attenuation  nodule, now measuring 21 mm AP by 12 mm transverse. Previously  this measured 17 mm x 8 mm. Enlargement is suspicious for low  grade neoplasm such as a low grade adenocarcinoma.  I had an extended discussion with the patient and wife  today lasting 15 to 20 minutes of a 25 minute visit on the following issues:   This could be a very slow growing well diff bronchogenic ca but note he never smoked so this is unlikely but not excluded.  Best way to be 100% sure is excisional bx as growing this slowly unlikely to be Pos on PET Other option is super CD directed bx per Dr Delton Coombes.  Pt will be contacted p Dr Delton Coombes has a chance to review and also may consider IR directed bx and if neg stop there.  Pt not inclined to any of the procedures but chose the tbbx as the most acceptable.

## 2012-09-26 ENCOUNTER — Telehealth: Payer: Self-pay | Admitting: Internal Medicine

## 2012-09-26 NOTE — Telephone Encounter (Signed)
Returning call.James [name] ° °

## 2012-09-26 NOTE — Telephone Encounter (Signed)
I spoke with pt and is aware. He voiced his understanding and needed nothing further 

## 2012-09-26 NOTE — Telephone Encounter (Signed)
LMTCB

## 2012-09-26 NOTE — Telephone Encounter (Signed)
I spoke with pt. He stated he did not catch what all MW lef ton his VM except he needed to keep his appt with RB and that he would try calling pt back. MW do we need to tell pt anything or would you like to call pt back. Please advise thanks

## 2012-09-26 NOTE — Telephone Encounter (Signed)
No, that was the message, that I can't see the spot on a plain cxr nor reach the area with a conventional scope and Byrum would offer to do it with a high tech approach after he sees all the films and the pt so he should keep this appt

## 2012-09-29 ENCOUNTER — Other Ambulatory Visit: Payer: Self-pay | Admitting: Emergency Medicine

## 2012-09-29 DIAGNOSIS — R918 Other nonspecific abnormal finding of lung field: Secondary | ICD-10-CM

## 2012-10-05 ENCOUNTER — Ambulatory Visit (INDEPENDENT_AMBULATORY_CARE_PROVIDER_SITE_OTHER): Payer: BC Managed Care – PPO | Admitting: Emergency Medicine

## 2012-10-05 ENCOUNTER — Encounter: Payer: Self-pay | Admitting: Emergency Medicine

## 2012-10-05 VITALS — BP 140/80 | HR 77 | Temp 99.4°F | Ht 69.0 in | Wt 238.4 lb

## 2012-10-05 DIAGNOSIS — J984 Other disorders of lung: Secondary | ICD-10-CM

## 2012-10-05 NOTE — Patient Instructions (Addendum)
We will perform bronchoscopy on 10/19/12 at [facility] You will be contacted by pre-admitting to arrange  Call our office with any questions

## 2012-10-05 NOTE — Assessment & Plan Note (Signed)
RUL GG nodule that has enlarged. I believe that we can reach by ENB.  Will schedule for 10/19/12. All questions answered.

## 2012-10-05 NOTE — Progress Notes (Signed)
Subjective:    Patient ID: James [name], male    DOB: 17-Mar-1951, 62 y.o.   MRN: [id number]  HPI 62 yo man, never smoker, hx of PPD + as a child but then non-reactive as an adult 2012, has been following a RUL GG opacity/nodule with Dr Sherene Sires dating back to 2011. He has serial CT scans that show interval increase in size, now 21x35mm. He presents to discuss possible bx by FOB + ENB.     Review of Systems  Constitutional: Negative for fever and unexpected weight change.  HENT: Negative for ear pain, nosebleeds, congestion, sore throat, rhinorrhea, sneezing, trouble swallowing, dental problem, postnasal drip and sinus pressure.   Eyes: Negative for redness and itching.  Respiratory: Negative for cough, chest tightness, shortness of breath and wheezing.   Cardiovascular: Negative for palpitations and leg swelling.  Gastrointestinal: Negative for nausea and vomiting.  Genitourinary: Negative for dysuria.  Musculoskeletal: Negative for joint swelling.  Skin: Negative for rash.  Neurological: Negative for headaches.  Hematological: Does not bruise/bleed easily.  Psychiatric/Behavioral: Negative for dysphoric mood. The patient is not nervous/anxious.     Past Medical History  Diagnosis Date  . Wrist pain, left   . Back pain   . Depressive disorder, not elsewhere classified   . Fasting hyperglycemia   . Special screening for malignant neoplasm of prostate   . Routine general medical examination at a health care facility   . Spinal stenosis, unspecified region other than cervical   . GERD (gastroesophageal reflux disease)   . Colon polyps     10/27/2002, repeat letter 09/17/2007  . Other and unspecified hyperlipidemia   . MPN (myeloproliferative neoplasm)     1st detected 06/04/1998  . Hemoptysis     abnormal CT Chest 01/29/10 - ? new GG changes RUL > not viz on plain cxr 02/26/2010  . Positive PPD     per pt  . Obesity   . OSA (obstructive sleep apnea)     cpap     Family History   Problem Relation Age of Onset  . Heart disease Father   . Heart disease Paternal Uncle   . Prostate cancer Neg Hx   . Colon cancer Neg Hx   . Hypertension Neg Hx   . Hyperlipidemia Neg Hx   . Diabetes Neg Hx      History   Social History  . Marital Status: Married    Spouse Name: N/A    Number of Children: 3  . Years of Education: N/A   Occupational History  . Retired, Multimedia programmer   . Retired, Real estate     Slow   Social History Main Topics  . Smoking status: Never Smoker   . Smokeless tobacco: Never Used  . Alcohol Use: 0.6 oz/week    1 Cans of beer per week     Comment: Socially   . Drug Use: No  . Sexually Active: Yes -- Male partner(s)   Other Topics Concern  . Not on file   Social History Narrative   HSG, [employer]. Married '73.  2 sons - '74,   '76;  1 daughter -  '77  6 grandchildren.   Work - Teacher, music now retired. ACP - not fully discussed.                     Allergies  Allergen Reactions  . Amoxicillin      Outpatient Prescriptions Prior to Visit  Medication Sig Dispense Refill  . famotidine (PEPCID AC) 10 MG chewable tablet Chew 10 mg by mouth daily as needed.        . Multiple Vitamins-Minerals (MULTIVITAMIN,TX-MINERALS) tablet Take 1 tablet by mouth daily.        . cyclobenzaprine (FLEXERIL) 5 MG tablet Take 5-10 mg by mouth every 8 (eight) hours as needed.         No facility-administered medications prior to visit.        Objective:   Physical Exam Filed Vitals:   10/05/12 1543  BP: 140/80  Pulse: 77  Temp: 99.4 F (37.4 C)  TempSrc: Oral  Height: 5\' 9"  (1.753 m)  Weight: 238 lb 6.4 oz (108.138 kg)  SpO2: 96%   Gen: Pleasant, obese, in no distress,  normal affect  ENT: No lesions,  mouth clear,  oropharynx clear, no postnasal drip  Neck: No JVD, no TMG, no carotid bruits  Lungs: No use of accessory muscles,  clear without rales or rhonchi  Cardiovascular: RRR, heart sounds  normal, no murmur or gallops, no peripheral edema  Musculoskeletal: No deformities, no cyanosis or clubbing  Neuro: alert, non focal  Skin: Warm, no lesions or rashes      Assessment & Plan:  PULMONARY NODULE RUL GG nodule that has enlarged. I believe that we can reach by ENB.  Will schedule for 10/19/12. All questions answered.

## 2012-10-11 ENCOUNTER — Other Ambulatory Visit (INDEPENDENT_AMBULATORY_CARE_PROVIDER_SITE_OTHER): Payer: BC Managed Care – PPO

## 2012-10-11 ENCOUNTER — Encounter (HOSPITAL_COMMUNITY): Payer: Self-pay | Admitting: Pharmacy Technician

## 2012-10-11 ENCOUNTER — Other Ambulatory Visit: Payer: Self-pay | Admitting: Internal Medicine

## 2012-10-11 DIAGNOSIS — R7309 Other abnormal glucose: Secondary | ICD-10-CM

## 2012-10-11 DIAGNOSIS — R739 Hyperglycemia, unspecified: Secondary | ICD-10-CM

## 2012-10-11 DIAGNOSIS — E291 Testicular hypofunction: Secondary | ICD-10-CM

## 2012-10-11 DIAGNOSIS — E785 Hyperlipidemia, unspecified: Secondary | ICD-10-CM

## 2012-10-11 LAB — COMPREHENSIVE METABOLIC PANEL
ALT: 39 U/L (ref 0–53)
AST: 30 U/L (ref 0–37)
Albumin: 3.8 g/dL (ref 3.5–5.2)
Alkaline Phosphatase: 48 U/L (ref 39–117)
BUN: 22 mg/dL (ref 6–23)
CO2: 23 mEq/L (ref 19–32)
Calcium: 9 mg/dL (ref 8.4–10.5)
Chloride: 112 mEq/L (ref 96–112)
Creatinine, Ser: 1 mg/dL (ref 0.4–1.5)
GFR: 79.19 mL/min (ref >60.00)
Glucose, Bld: 110 mg/dL — ABNORMAL HIGH (ref 70–99)
Potassium: 5.3 mEq/L — ABNORMAL HIGH (ref 3.5–5.1)
Sodium: 142 mEq/L (ref 135–145)
Total Bilirubin: 0.7 mg/dL (ref 0.3–1.2)
Total Protein: 6.2 g/dL (ref 6.0–8.3)

## 2012-10-11 LAB — LIPID PANEL
Cholesterol: 215 mg/dL — ABNORMAL HIGH (ref 0–200)
HDL: 33.5 mg/dL — ABNORMAL LOW (ref >39.00)
Total CHOL/HDL Ratio: 6
Triglycerides: 108 mg/dL (ref 0.0–149.0)
VLDL: 21.6 mg/dL (ref 0.0–40.0)

## 2012-10-11 LAB — LDL CHOLESTEROL, DIRECT: Direct LDL: 163 mg/dL

## 2012-10-11 LAB — TESTOSTERONE: Testosterone: 265.93 ng/dL — ABNORMAL LOW (ref 350.00–890.00)

## 2012-10-12 NOTE — Pre-Procedure Instructions (Signed)
LAUREL SMELTZ  10/12/2012   Your procedure is scheduled on:  Wednesday October 19, 2012.  Report to [facility] 3[facility] at 6:30 AM.  Call this number if you have problems the morning of surgery: [phone]   Remember:   Do not eat food or drink liquids after midnight.   Take these medicines the morning of surgery with A SIP OF WATER: None   Do not wear jewelry  Do not wear lotions or colognes.  Men may shave face and neck.  Do not bring valuables to the hospital.  [facility] is not responsible for any belongings or valuables.  Contacts, dentures or bridgework may not be worn into surgery.  Leave suitcase in the car. After surgery it may be brought to your room.  For patients admitted to the hospital, checkout time is 11:00 AM the day of discharge.   Patients discharged the day of surgery will not be allowed to drive home.  Name and phone number of your driver: Family/Friend  Special Instructions: Shower using CHG 2 nights before surgery and the night before surgery.  If you shower the day of surgery use CHG.  Use special wash - you have one bottle of CHG for all showers.  You should use approximately 1/3 of the bottle for each shower.   Please read over the following fact sheets that you were given: Pain Booklet, Coughing and Deep Breathing, MRSA Information and Surgical Site Infection Prevention

## 2012-10-13 ENCOUNTER — Encounter (HOSPITAL_COMMUNITY)
Admission: RE | Admit: 2012-10-13 | Discharge: 2012-10-13 | Disposition: A | Discharge status: Home / Self Care | Payer: BC Managed Care – PPO | Source: Ambulatory Visit | Attending: Emergency Medicine | Admitting: Emergency Medicine

## 2012-10-13 ENCOUNTER — Encounter (HOSPITAL_COMMUNITY): Payer: Self-pay

## 2012-10-13 DIAGNOSIS — Z01812 Encounter for preprocedural laboratory examination: Secondary | ICD-10-CM | POA: Insufficient documentation

## 2012-10-13 DIAGNOSIS — Z01818 Encounter for other preprocedural examination: Secondary | ICD-10-CM | POA: Insufficient documentation

## 2012-10-13 HISTORY — DX: Unspecified osteoarthritis, unspecified site: M19.90

## 2012-10-13 LAB — COMPREHENSIVE METABOLIC PANEL
ALT: 53 U/L (ref 0–53)
AST: 35 U/L (ref 0–37)
Albumin: 4.2 g/dL (ref 3.5–5.2)
Alkaline Phosphatase: 67 U/L (ref 39–117)
BUN: 18 mg/dL (ref 6–23)
CO2: 27 mEq/L (ref 19–32)
Calcium: 9.8 mg/dL (ref 8.4–10.5)
Chloride: 106 mEq/L (ref 96–112)
Creatinine, Ser: 1.07 mg/dL (ref 0.50–1.35)
GFR calc Af Amer: 83 mL/min — ABNORMAL LOW (ref >90)
GFR calc non Af Amer: 72 mL/min — ABNORMAL LOW (ref >90)
Glucose, Bld: 99 mg/dL (ref 70–99)
Potassium: 4.4 mEq/L (ref 3.5–5.1)
Sodium: 141 mEq/L (ref 135–145)
Total Bilirubin: 0.3 mg/dL (ref 0.3–1.2)
Total Protein: 7.3 g/dL (ref 6.0–8.3)

## 2012-10-13 LAB — PROTIME-INR
INR: 0.89 (ref 0.00–1.49)
Prothrombin Time: 12 seconds (ref 11.6–15.2)

## 2012-10-13 LAB — CBC
HCT: 46.9 % (ref 39.0–52.0)
Hemoglobin: 16.3 g/dL (ref 13.0–17.0)
MCH: 31.3 pg (ref 26.0–34.0)
MCHC: 34.8 g/dL (ref 30.0–36.0)
MCV: 90.2 fL (ref 78.0–100.0)
Platelets: 189 10*3/uL (ref 150–400)
RBC: 5.2 MIL/uL (ref 4.22–5.81)
RDW: 13.4 % (ref 11.5–15.5)
WBC: 6.8 10*3/uL (ref 4.0–10.5)

## 2012-10-13 LAB — SURGICAL PCR SCREEN
MRSA, PCR: NEGATIVE
Staphylococcus aureus: NEGATIVE

## 2012-10-13 LAB — APTT: aPTT: 26 seconds (ref 24–37)

## 2012-10-13 NOTE — Progress Notes (Signed)
Patient informed Nurse that he had a stress test and cardiac cath in Jan 2000. No PCI and no issues thereafter. PCP is Dr. Debby Bud.

## 2012-10-17 ENCOUNTER — Ambulatory Visit (INDEPENDENT_AMBULATORY_CARE_PROVIDER_SITE_OTHER): Payer: BC Managed Care – PPO | Admitting: Internal Medicine

## 2012-10-17 ENCOUNTER — Encounter: Payer: Self-pay | Admitting: Internal Medicine

## 2012-10-17 VITALS — BP 138/80 | HR 64 | Temp 98.9°F | Resp 12 | Ht 69.0 in | Wt 236.0 lb

## 2012-10-17 DIAGNOSIS — Z0000 Encounter for general adult medical examination without abnormal findings: Secondary | ICD-10-CM

## 2012-10-17 DIAGNOSIS — J984 Other disorders of lung: Secondary | ICD-10-CM

## 2012-10-17 DIAGNOSIS — E291 Testicular hypofunction: Secondary | ICD-10-CM

## 2012-10-17 DIAGNOSIS — M25532 Pain in left wrist: Secondary | ICD-10-CM

## 2012-10-17 DIAGNOSIS — E669 Obesity, unspecified: Secondary | ICD-10-CM

## 2012-10-17 DIAGNOSIS — R7309 Other abnormal glucose: Secondary | ICD-10-CM

## 2012-10-17 DIAGNOSIS — E785 Hyperlipidemia, unspecified: Secondary | ICD-10-CM

## 2012-10-17 NOTE — Assessment & Plan Note (Addendum)
LDL 163 - above treatment threshold of 160+; HDL 33.5 - low. He had been on lipitor but stopped thinking this was contributing to low testosterone. He did have piriformis pain but this persisted after stopping lipitor. He prefers to not take medication.   Plan Life-style management: low fat diet, regular exercise  Repeat lipid panel in 6 months.

## 2012-10-17 NOTE — Assessment & Plan Note (Signed)
Wt Readings from Last 3 Encounters:  10/17/12 236 lb (107.049 kg)  10/13/12 237 lb 6.4 oz (107.684 kg)  10/05/12 238 lb 6.4 oz (108.138 kg)   Minimal change in weight.  Plan  - Diet management: smart food choices, PORTION SIZE CONTROL, regular exercise. Goal - to loose 1-2 lbs.month. Target weight - 190-200 lbs

## 2012-10-17 NOTE — Assessment & Plan Note (Signed)
Interval history significant for enlarging pulmonary nodule. He has also had reinjury to the left hip. In the past he had a good response to [facility]. No hip films on record. Physical exam - normal except for weight. Labs reviewed. He is current for colorectal cancer screening and PSA in '12 was 0.66 normal - per ACU guidelines not due until 2015. Immunizations - current except for shingles vaccine.   In summary -a nice man who is facing a complete work-up for progressive pulmonary nodule. Will follow along closely

## 2012-10-17 NOTE — Assessment & Plan Note (Signed)
Patient continues to have low testosterone and he is concerned about MSK effects. Does not want hormone therapy at this time but is open to further discussion if regular exercise doesn't help with weight distribution and muscle toning.

## 2012-10-17 NOTE — Assessment & Plan Note (Signed)
Last serum glucose 99.  Lab Results  Component Value Date   HGBA1C 6.3 09/12/2012   Good control with normal glucose and A1C values.

## 2012-10-17 NOTE — Patient Instructions (Addendum)
Thanks for coming in. Good luck with the bronchoscopy. There are good signs: no weight loss, night sweats or other symptoms or signs of malignancy.  Your exam is good except for weight.  The cholesterol is too high with an LDL of 163 with a goal of 409 or less. This is an important issue which you may be able to address with weight loss and exercise.  Testosterone level is low - you will need to think about whether you have symptoms that do not correct with exercise. Hormone replacement does appear to be safe in men who are deficient in testosterone and do no have major cardiac risk.  Full report to follow.
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
## 2012-10-17 NOTE — Assessment & Plan Note (Signed)
Reviewed CT scans and pulmonary consult notes. No significant left supra-clavicular node. He has not had systemic symptoms to suggest malignancy but with enlarging pulmonary nodule he is prepared to move forward with Bronch and biopsy. Discussed at length with him and Mrs. Kleckner. (20 min)
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
## 2012-10-19 ENCOUNTER — Ambulatory Visit (HOSPITAL_COMMUNITY): Payer: BC Managed Care – PPO | Admitting: Anesthesiology

## 2012-10-19 ENCOUNTER — Ambulatory Visit (HOSPITAL_COMMUNITY): Payer: BC Managed Care – PPO

## 2012-10-19 ENCOUNTER — Encounter (HOSPITAL_COMMUNITY): Payer: Self-pay | Admitting: Anesthesiology

## 2012-10-19 ENCOUNTER — Inpatient Hospital Stay (HOSPITAL_COMMUNITY)
Admission: RE | Admit: 2012-10-19 | Discharge: 2012-10-25 | DRG: 095 | Disposition: A | Discharge status: Home / Self Care | Payer: BC Managed Care – PPO | Source: Ambulatory Visit | Attending: Emergency Medicine | Admitting: Emergency Medicine

## 2012-10-19 ENCOUNTER — Encounter (HOSPITAL_COMMUNITY)
Admission: RE | Disposition: A | Discharge status: Home / Self Care | Payer: Self-pay | Source: Ambulatory Visit | Attending: Emergency Medicine

## 2012-10-19 DIAGNOSIS — R071 Chest pain on breathing: Secondary | ICD-10-CM | POA: Diagnosis present

## 2012-10-19 DIAGNOSIS — J984 Other disorders of lung: Secondary | ICD-10-CM | POA: Diagnosis present

## 2012-10-19 DIAGNOSIS — E669 Obesity, unspecified: Secondary | ICD-10-CM | POA: Diagnosis present

## 2012-10-19 DIAGNOSIS — R918 Other nonspecific abnormal finding of lung field: Secondary | ICD-10-CM | POA: Diagnosis present

## 2012-10-19 DIAGNOSIS — Z6834 Body mass index (BMI) 34.0-34.9, adult: Secondary | ICD-10-CM

## 2012-10-19 DIAGNOSIS — G4733 Obstructive sleep apnea (adult) (pediatric): Secondary | ICD-10-CM | POA: Diagnosis present

## 2012-10-19 DIAGNOSIS — J9383 Other pneumothorax: Principal | ICD-10-CM | POA: Diagnosis present

## 2012-10-19 DIAGNOSIS — J95811 Postprocedural pneumothorax: Secondary | ICD-10-CM | POA: Diagnosis present

## 2012-10-19 DIAGNOSIS — R911 Solitary pulmonary nodule: Secondary | ICD-10-CM | POA: Diagnosis present

## 2012-10-19 DIAGNOSIS — F3289 Other specified depressive episodes: Secondary | ICD-10-CM | POA: Diagnosis present

## 2012-10-19 DIAGNOSIS — R7309 Other abnormal glucose: Secondary | ICD-10-CM | POA: Diagnosis present

## 2012-10-19 DIAGNOSIS — K219 Gastro-esophageal reflux disease without esophagitis: Secondary | ICD-10-CM | POA: Diagnosis present

## 2012-10-19 DIAGNOSIS — E785 Hyperlipidemia, unspecified: Secondary | ICD-10-CM | POA: Diagnosis present

## 2012-10-19 DIAGNOSIS — F329 Major depressive disorder, single episode, unspecified: Secondary | ICD-10-CM | POA: Diagnosis present

## 2012-10-19 HISTORY — PX: VIDEO BRONCHOSCOPY WITH ENDOBRONCHIAL NAVIGATION: SHX6175

## 2012-10-19 HISTORY — PX: FLEXIBLE BRONCHOSCOPY: SUR164

## 2012-10-19 HISTORY — DX: Obstructive sleep apnea (adult) (pediatric): G47.33

## 2012-10-19 HISTORY — DX: Low back pain: M54.5

## 2012-10-19 HISTORY — DX: Other chronic pain: G89.29

## 2012-10-19 HISTORY — DX: Low back pain, unspecified: M54.50

## 2012-10-19 HISTORY — DX: Dependence on other enabling machines and devices: Z99.89

## 2012-10-19 HISTORY — PX: CHEST TUBE INSERTION: SHX231

## 2012-10-19 LAB — COMPREHENSIVE METABOLIC PANEL
ALT: 45 U/L (ref 0–53)
AST: 31 U/L (ref 0–37)
Albumin: 3.6 g/dL (ref 3.5–5.2)
Alkaline Phosphatase: 53 U/L (ref 39–117)
BUN: 20 mg/dL (ref 6–23)
CO2: 25 mEq/L (ref 19–32)
Calcium: 8.7 mg/dL (ref 8.4–10.5)
Chloride: 105 mEq/L (ref 96–112)
Creatinine, Ser: 1.06 mg/dL (ref 0.50–1.35)
GFR calc Af Amer: 84 mL/min — ABNORMAL LOW (ref >90)
GFR calc non Af Amer: 73 mL/min — ABNORMAL LOW (ref >90)
Glucose, Bld: 103 mg/dL — ABNORMAL HIGH (ref 70–99)
Potassium: 4.8 mEq/L (ref 3.5–5.1)
Sodium: 138 mEq/L (ref 135–145)
Total Bilirubin: 0.6 mg/dL (ref 0.3–1.2)
Total Protein: 6.5 g/dL (ref 6.0–8.3)

## 2012-10-19 LAB — CBC
HCT: 44.5 % (ref 39.0–52.0)
Hemoglobin: 15.3 g/dL (ref 13.0–17.0)
MCH: 31.1 pg (ref 26.0–34.0)
MCHC: 34.4 g/dL (ref 30.0–36.0)
MCV: 90.4 fL (ref 78.0–100.0)
Platelets: 173 10*3/uL (ref 150–400)
RBC: 4.92 MIL/uL (ref 4.22–5.81)
RDW: 13.9 % (ref 11.5–15.5)
WBC: 10.2 10*3/uL (ref 4.0–10.5)

## 2012-10-19 SURGERY — VIDEO BRONCHOSCOPY WITH ENDOBRONCHIAL NAVIGATION
Anesthesia: General | Site: Chest | Wound class: Clean Contaminated

## 2012-10-19 MED ORDER — LIDOCAINE HCL (CARDIAC) 20 MG/ML IV SOLN
INTRAVENOUS | Status: DC | PRN
  Administered 2012-10-19: 100 mg via INTRAVENOUS

## 2012-10-19 MED ORDER — LACTATED RINGERS IV SOLN
INTRAVENOUS | Status: DC | PRN
  Administered 2012-10-19 (×2): via INTRAVENOUS

## 2012-10-19 MED ORDER — PROPOFOL 10 MG/ML IV BOLUS
INTRAVENOUS | Status: DC | PRN
  Administered 2012-10-19: 200 mg via INTRAVENOUS

## 2012-10-19 MED ORDER — NEOSTIGMINE METHYLSULFATE 1 MG/ML IJ SOLN
INTRAMUSCULAR | Status: DC | PRN
  Administered 2012-10-19: 4 mg via INTRAVENOUS

## 2012-10-19 MED ORDER — ARTIFICIAL TEARS OP OINT
TOPICAL_OINTMENT | OPHTHALMIC | Status: DC | PRN
  Administered 2012-10-19: 1 via OPHTHALMIC

## 2012-10-19 MED ORDER — LABETALOL HCL 5 MG/ML IV SOLN
INTRAVENOUS | Status: DC | PRN
  Administered 2012-10-19: 5 mg via INTRAVENOUS

## 2012-10-19 MED ORDER — MIDAZOLAM HCL 2 MG/2ML IJ SOLN
INTRAMUSCULAR | Status: AC
  Filled 2012-10-19: qty 2

## 2012-10-19 MED ORDER — ROCURONIUM BROMIDE 100 MG/10ML IV SOLN
INTRAVENOUS | Status: DC | PRN
  Administered 2012-10-19: 50 mg via INTRAVENOUS

## 2012-10-19 MED ORDER — MORPHINE SULFATE 2 MG/ML IJ SOLN
1.0000 mg | INTRAMUSCULAR | Status: DC | PRN
  Administered 2012-10-19: 1 mg via INTRAVENOUS
  Filled 2012-10-19: qty 1

## 2012-10-19 MED ORDER — MIDAZOLAM HCL 2 MG/2ML IJ SOLN
2.0000 mg | Freq: Once | INTRAMUSCULAR | Status: AC
  Administered 2012-10-19: 2 mg via INTRAVENOUS

## 2012-10-19 MED ORDER — ONDANSETRON HCL 4 MG/2ML IJ SOLN
4.0000 mg | Freq: Once | INTRAMUSCULAR | Status: AC | PRN
  Administered 2012-10-19: 4 mg via INTRAVENOUS
  Filled 2012-10-19: qty 2

## 2012-10-19 MED ORDER — OXYCODONE HCL 5 MG PO TABS: 5.0000 mg | ORAL_TABLET | Freq: Once | ORAL | Status: DC | PRN

## 2012-10-19 MED ORDER — ONDANSETRON HCL 4 MG/2ML IJ SOLN
INTRAMUSCULAR | Status: DC | PRN
  Administered 2012-10-19: 4 mg via INTRAVENOUS

## 2012-10-19 MED ORDER — LIDOCAINE HCL 4 % MT SOLN
OROMUCOSAL | Status: DC | PRN
  Administered 2012-10-19: 4 mL via TOPICAL

## 2012-10-19 MED ORDER — OXYCODONE HCL 5 MG PO TABS
5.0000 mg | ORAL_TABLET | ORAL | Status: DC | PRN
  Administered 2012-10-19 – 2012-10-20 (×3): 5 mg via ORAL
  Filled 2012-10-19 (×3): qty 1

## 2012-10-19 MED ORDER — ADULT MULTIVITAMIN W/MINERALS CH
1.0000 | ORAL_TABLET | Freq: Every day | ORAL | Status: DC
  Filled 2012-10-19 (×6): qty 1

## 2012-10-19 MED ORDER — OXYCODONE HCL 5 MG/5ML PO SOLN: 5.0000 mg | Freq: Once | ORAL | Status: DC | PRN

## 2012-10-19 MED ORDER — GLYCOPYRROLATE 0.2 MG/ML IJ SOLN
INTRAMUSCULAR | Status: DC | PRN
  Administered 2012-10-19: 0.6 mg via INTRAVENOUS

## 2012-10-19 MED ORDER — WHITE PETROLATUM GEL
Status: AC
  Administered 2012-10-19: 0.2
  Filled 2012-10-19: qty 5

## 2012-10-19 MED ORDER — HYDROMORPHONE HCL PF 1 MG/ML IJ SOLN
INTRAMUSCULAR | Status: AC
  Filled 2012-10-19: qty 1

## 2012-10-19 MED ORDER — HYDROMORPHONE HCL PF 1 MG/ML IJ SOLN
0.2500 mg | INTRAMUSCULAR | Status: DC | PRN
  Administered 2012-10-19 (×3): 0.5 mg via INTRAVENOUS

## 2012-10-19 MED ORDER — SODIUM CHLORIDE 0.9 % IV SOLN
INTRAVENOUS | Status: DC
  Administered 2012-10-19 – 2012-10-20 (×2): via INTRAVENOUS

## 2012-10-19 MED ORDER — 0.9 % SODIUM CHLORIDE (POUR BTL) OPTIME
TOPICAL | Status: DC | PRN
  Administered 2012-10-19: 1000 mL

## 2012-10-19 MED ORDER — MIDAZOLAM HCL 5 MG/5ML IJ SOLN
INTRAMUSCULAR | Status: DC | PRN
  Administered 2012-10-19: 2 mg via INTRAVENOUS

## 2012-10-19 MED ORDER — FENTANYL CITRATE 0.05 MG/ML IJ SOLN
INTRAMUSCULAR | Status: DC | PRN
  Administered 2012-10-19: 50 ug via INTRAVENOUS
  Administered 2012-10-19 (×2): 100 ug via INTRAVENOUS

## 2012-10-19 MED ORDER — DOCUSATE SODIUM 100 MG PO CAPS
100.0000 mg | ORAL_CAPSULE | Freq: Two times a day (BID) | ORAL | Status: DC
  Administered 2012-10-19 – 2012-10-21 (×5): 100 mg via ORAL
  Filled 2012-10-19 (×5): qty 1

## 2012-10-19 MED ORDER — ALBUTEROL SULFATE (5 MG/ML) 0.5% IN NEBU: 2.5000 mg | INHALATION_SOLUTION | RESPIRATORY_TRACT | Status: DC | PRN

## 2012-10-19 SURGICAL SUPPLY — 35 items
BRUSH CYTOL CELLEBRITY 1.5X140 (MISCELLANEOUS) ×1 IMPLANT
BRUSH SUPERTRAX BIOPSY (INSTRUMENTS) ×1 IMPLANT
BRUSH SUPERTRAX NDL-TIP CYTO (INSTRUMENTS) ×1 IMPLANT
CANISTER SUCTION 2500CC (MISCELLANEOUS) ×2 IMPLANT
CHANNEL WORK EXTEND EDGE 180 (KITS) IMPLANT
CHANNEL WORK EXTEND EDGE 45 (KITS) IMPLANT
CHANNEL WORK EXTEND EDGE 90 (KITS) IMPLANT
CLOTH BEACON ORANGE TIMEOUT ST (SAFETY) ×2 IMPLANT
CONT SPEC 4OZ CLIKSEAL STRL BL (MISCELLANEOUS) ×3 IMPLANT
COVER TABLE BACK 60X90 (DRAPES) ×2 IMPLANT
FILTER STRAW FLUID ASPIR (MISCELLANEOUS) IMPLANT
FORCEPS BIOP SUPERTRX PREMAR (INSTRUMENTS) ×1 IMPLANT
GLOVE BIOGEL M STRL SZ7.5 (GLOVE) ×4 IMPLANT
GLOVE BIOGEL PI IND STRL 6.5 (GLOVE) IMPLANT
GLOVE BIOGEL PI INDICATOR 6.5 (GLOVE) ×1
KIT LOCATABLE GUIDE (CANNULA) IMPLANT
KIT MARKER FIDUCIAL DELIVERY (KITS) ×1 IMPLANT
KIT PROCEDURE EDGE 180 (KITS) ×1 IMPLANT
KIT PROCEDURE EDGE 45 (KITS) IMPLANT
KIT PROCEDURE EDGE 90 (KITS) IMPLANT
KIT ROOM TURNOVER OR (KITS) ×2 IMPLANT
MARKER SKIN DUAL TIP RULER LAB (MISCELLANEOUS) ×2 IMPLANT
NDL SUPERTRX PREMARK BIOPSY (NEEDLE) IMPLANT
NEEDLE SUPERTRX PREMARK BIOPSY (NEEDLE) ×2 IMPLANT
NS IRRIG 1000ML POUR BTL (IV SOLUTION) ×2 IMPLANT
OIL SILICONE PENTAX (PARTS (SERVICE/REPAIRS)) ×2 IMPLANT
PAD ARMBOARD 7.5X6 YLW CONV (MISCELLANEOUS) ×4 IMPLANT
PATCHES PATIENT (LABEL) ×2 IMPLANT
SPONGE GAUZE 4X4 12PLY (GAUZE/BANDAGES/DRESSINGS) ×2 IMPLANT
SYR 20CC LL (SYRINGE) ×3 IMPLANT
SYR 20ML ECCENTRIC (SYRINGE) ×2 IMPLANT
SYRINGE TOOMEY DISP (SYRINGE) ×2 IMPLANT
TOWEL OR 17X24 6PK STRL BLUE (TOWEL DISPOSABLE) ×2 IMPLANT
TRAP SPECIMEN MUCOUS 40CC (MISCELLANEOUS) ×2 IMPLANT
TUBE CONNECTING 12X1/4 (SUCTIONS) ×3 IMPLANT

## 2012-10-19 NOTE — Progress Notes (Signed)
Pt. With increasing c/os of pain and hard to breathe, xray done and Dr. Delton Coombes at bedside, Chest tube to be inserted at bedside

## 2012-10-19 NOTE — Anesthesia Postprocedure Evaluation (Signed)
  Anesthesia Post-op Note  Patient: James [name]  Procedure(s) Performed: Procedure(s): VIDEO BRONCHOSCOPY WITH ENDOBRONCHIAL NAVIGATION (N/A)  Patient Location: PACU  Anesthesia Type:General  Level of Consciousness: awake, alert  and oriented  Airway and Oxygen Therapy: Patient Spontanous Breathing and Patient connected to nasal cannula oxygen  Post-op Pain: mild  Post-op Assessment: Post-op Vital signs reviewed  Post-op Vital Signs: Reviewed  Complications: No apparent anesthesia complications

## 2012-10-19 NOTE — Progress Notes (Signed)
Chest tube being inserted at bedside per DR [facility]

## 2012-10-19 NOTE — H&P (View-Only) (Signed)
Subjective:    Patient ID: James [name], male    DOB: 11/21/1950, 62 y.o.   MRN: [id number]  HPI 62 yo man, never smoker, hx of PPD + as a child but then non-reactive as an adult 2012, has been following a RUL GG opacity/nodule with Dr Wert dating back to 2011. He has serial CT scans that show interval increase in size, now 21x12mm. He presents to discuss possible bx by FOB + ENB.     Review of Systems  Constitutional: Negative for fever and unexpected weight change.  HENT: Negative for ear pain, nosebleeds, congestion, sore throat, rhinorrhea, sneezing, trouble swallowing, dental problem, postnasal drip and sinus pressure.   Eyes: Negative for redness and itching.  Respiratory: Negative for cough, chest tightness, shortness of breath and wheezing.   Cardiovascular: Negative for palpitations and leg swelling.  Gastrointestinal: Negative for nausea and vomiting.  Genitourinary: Negative for dysuria.  Musculoskeletal: Negative for joint swelling.  Skin: Negative for rash.  Neurological: Negative for headaches.  Hematological: Does not bruise/bleed easily.  Psychiatric/Behavioral: Negative for dysphoric mood. The patient is not nervous/anxious.     Past Medical History  Diagnosis Date  . Wrist pain, left   . Back pain   . Depressive disorder, not elsewhere classified   . Fasting hyperglycemia   . Special screening for malignant neoplasm of prostate   . Routine general medical examination at a health care facility   . Spinal stenosis, unspecified region other than cervical   . GERD (gastroesophageal reflux disease)   . Colon polyps     10/27/2002, repeat letter 09/17/2007  . Other and unspecified hyperlipidemia   . MPN (myeloproliferative neoplasm)     1st detected 06/04/1998  . Hemoptysis     abnormal CT Chest 01/29/10 - ? new GG changes RUL > not viz on plain cxr 02/26/2010  . Positive PPD     per pt  . Obesity   . OSA (obstructive sleep apnea)     cpap     Family History   Problem Relation Age of Onset  . Heart disease Father   . Heart disease Paternal Uncle   . Prostate cancer Neg Hx   . Colon cancer Neg Hx   . Hypertension Neg Hx   . Hyperlipidemia Neg Hx   . Diabetes Neg Hx      History   Social History  . Marital Status: Married    Spouse Name: N/A    Number of Children: 3  . Years of Education: N/A   Occupational History  . Retired, Teacher & Coach   . Retired, Real estate     Slow   Social History Main Topics  . Smoking status: Never Smoker   . Smokeless tobacco: Never Used  . Alcohol Use: 0.6 oz/week    1 Cans of beer per week     Comment: Socially   . Drug Use: No  . Sexually Active: Yes -- Male partner(s)   Other Topics Concern  . Not on file   Social History Narrative   HSG, [employer] BS. Married '73.  2 sons - '74,   '76;  1 daughter -  '77  6 grandchildren.   Work - career school teacher/coach now retired. ACP - not fully discussed.                     Allergies  Allergen Reactions  . Amoxicillin      Outpatient Prescriptions Prior to Visit    Medication Sig Dispense Refill  . famotidine (PEPCID AC) 10 MG chewable tablet Chew 10 mg by mouth daily as needed.        . Multiple Vitamins-Minerals (MULTIVITAMIN,TX-MINERALS) tablet Take 1 tablet by mouth daily.        . cyclobenzaprine (FLEXERIL) 5 MG tablet Take 5-10 mg by mouth every 8 (eight) hours as needed.         No facility-administered medications prior to visit.        Objective:   Physical Exam Filed Vitals:   10/05/12 1543  BP: 140/80  Pulse: 77  Temp: 99.4 F (37.4 C)  TempSrc: Oral  Height: 5' 9" (1.753 m)  Weight: 238 lb 6.4 oz (108.138 kg)  SpO2: 96%   Gen: Pleasant, obese, in no distress,  normal affect  ENT: No lesions,  mouth clear,  oropharynx clear, no postnasal drip  Neck: No JVD, no TMG, no carotid bruits  Lungs: No use of accessory muscles,  clear without rales or rhonchi  Cardiovascular: RRR, heart sounds  normal, no murmur or gallops, no peripheral edema  Musculoskeletal: No deformities, no cyanosis or clubbing  Neuro: alert, non focal  Skin: Warm, no lesions or rashes      Assessment & Plan:  PULMONARY NODULE RUL GG nodule that has enlarged. I believe that we can reach by ENB.  Will schedule for 10/19/12. All questions answered.      

## 2012-10-19 NOTE — Interval H&P Note (Signed)
PCCM Interval Hx:  Mr Ryser presents for FOB + ENB of his RUL GG nodule. He understands the procedure, risks, benefits and elects to proceed  Filed Vitals:   10/19/12 0645  BP: 139/79  Pulse: 60  Temp: 98.5 F (36.9 C)  TempSrc: Oral  Resp: 16  SpO2: 97%    Recent Labs Lab 10/13/12 1446  HGB 16.3  HCT 46.9  WBC 6.8  PLT 189    Recent Labs Lab 10/13/12 1446  NA 141  K 4.4  CL 106  CO2 27  GLUCOSE 99  BUN 18  CREATININE 1.07  CALCIUM 9.8   Dg Chest 2 View  10/19/2012   *RADIOLOGY REPORT*  Clinical Data: Preop bronchoscopy.  CHEST - 2 VIEW  Comparison: 09/21/2012 and CT chest 09/12/2012.  Findings: Trachea is midline.  Heart size normal. A known right upper lobe nodule is poorly visualized.  Lungs are mildly hyperinflated but otherwise clear.  No pleural fluid.  No degenerative changes are seen in the spine.  IMPRESSION: Known right upper lobe nodule is poorly visualized.   Original Report Authenticated By: Leanna Battles, M.D.   Plan:  - FOB + ENB today using 180 degree catheter.  - will review path w pt, dr wert, dr norrins when available.   Levy Pupa, MD, PhD 10/19/2012, 8:20 AM [facility] [phone] or if no answer [phone]

## 2012-10-19 NOTE — Anesthesia Preprocedure Evaluation (Addendum)
Anesthesia Evaluation  Patient identified by MRN, date of birth, ID band Patient awake    Reviewed: Allergy & Precautions, H&P , NPO status , Patient's Chart, lab work & pertinent test results  Airway Mallampati: II TM Distance: >3 FB Neck ROM: Full    Dental  (+) Dental Advisory Given and Poor Dentition   Pulmonary sleep apnea and Continuous Positive Airway Pressure Ventilation ,  breath sounds clear to auscultation        Cardiovascular Rhythm:Regular     Neuro/Psych PSYCHIATRIC DISORDERS Anxiety Depression    GI/Hepatic GERD-  Medicated and Controlled,  Endo/Other    Renal/GU      Musculoskeletal   Abdominal   Peds  Hematology  (+) Blood dyscrasia, ,   Anesthesia Other Findings   Reproductive/Obstetrics                         Anesthesia Physical Anesthesia Plan  ASA: II  Anesthesia Plan: General   Post-op Pain Management:    Induction: Intravenous  Airway Management Planned: Oral ETT  Additional Equipment:   Intra-op Plan:   Post-operative Plan: Extubation in OR  Informed Consent: I have reviewed the patients History and Physical, chart, labs and discussed the procedure including the risks, benefits and alternatives for the proposed anesthesia with the patient or authorized representative who has indicated his/her understanding and acceptance.   Dental advisory given  Plan Discussed with: Anesthesiologist, Surgeon and CRNA  Anesthesia Plan Comments:         Anesthesia Quick Evaluation

## 2012-10-19 NOTE — Anesthesia Procedure Notes (Signed)
Procedure Name: Intubation Date/Time: 10/19/2012 8:35 AM Performed by: Luster Landsberg Pre-anesthesia Checklist: Patient identified, Emergency Drugs available, Suction available and Patient being monitored Patient Re-evaluated:Patient Re-evaluated prior to inductionOxygen Delivery Method: Circle system utilized Preoxygenation: Pre-oxygenation with 100% oxygen Intubation Type: IV induction Ventilation: Mask ventilation without difficulty and Oral airway inserted - appropriate to patient size Grade View: Grade I Tube type: Oral Tube size: 8.5 mm Number of attempts: 1 Airway Equipment and Method: Video-laryngoscopy,  Stylet and LTA kit utilized Placement Confirmation: ETT inserted through vocal cords under direct vision,  positive ETCO2 and breath sounds checked- equal and bilateral Secured at: 23 cm Tube secured with: Tape Dental Injury: Teeth and Oropharynx as per pre-operative assessment

## 2012-10-19 NOTE — Progress Notes (Signed)
Post chest tube chest xray done and seen by Anders Simmonds NP

## 2012-10-19 NOTE — Progress Notes (Signed)
Chest tube inserted, 20 of suction , slight air leak noted, Dr Delton Coombes still at bedside

## 2012-10-19 NOTE — Transfer of Care (Signed)
Immediate Anesthesia Transfer of Care Note  Patient: James [name]  Procedure(s) Performed: Procedure(s): VIDEO BRONCHOSCOPY WITH ENDOBRONCHIAL NAVIGATION (N/A)  Patient Location: PACU  Anesthesia Type:General  Level of Consciousness: awake and alert   Airway & Oxygen Therapy: Patient Spontanous Breathing and Patient connected to nasal cannula oxygen  Post-op Assessment: Report given to PACU RN, Post -op Vital signs reviewed and stable and Patient moving all extremities  Post vital signs: Reviewed and stable  Complications: No apparent anesthesia complications

## 2012-10-19 NOTE — Op Note (Signed)
Video Bronchoscopy with Electromagnetic Navigation Procedure Note  Date of Operation: 10/19/2012  Pre-op Diagnosis: RUL GG nodule  Post-op Diagnosis: same  Surgeon: Levy Pupa  Assistants: none  Anesthesia: General endotracheal anesthesia  Operation: Flexible video fiberoptic bronchoscopy with electromagnetic navigation and biopsies.  Estimated Blood Loss: Minimal  Complications: none apparent  Indications and History: James [name] is a 62 y.o. male with a slowly enlarging RUL ground glass nodule. Most recent Ct scan 09/14/12 showed it to be 2.1 x 1.2 cm. Recommendation was made to pursue biopsy via bronchoscopy and electromagnetic navigation.  The risks, benefits, complications, treatment options and expected outcomes were discussed with the patient.  The possibilities of pneumothorax, pneumonia, reaction to medication, pulmonary aspiration, perforation of a viscus, bleeding, failure to diagnose a condition and creating a complication requiring transfusion or operation were discussed with the patient who freely signed the consent.    Description of Procedure: The patient was seen in the Preoperative Area, was examined and was deemed appropriate to proceed.  The patient was taken to OR 10, identified as James [name] and the procedure verified as Flexible Video Fiberoptic Bronchoscopy.  A Time Out was held and the above information confirmed.   Prior to the date of the procedure a high-resolution CT scan of the chest was performed. Utilizing superDimension software a virtual tracheobronchial tree was generated to allow the creation of distinct navigation pathways to the patient's parenchymal abnormality. After being taken to the operating room general anesthesia was initiated and the patient  was orally intubated. The video fiberoptic bronchoscope was introduced via the endotracheal tube and a general inspection was performed which showed normal airways throughout - no endobronchial  lesions or abnormal secretions. The extendable working channel and locator guide were introduced into the bronchoscope. The distinct navigation pathway prepared prior to this procedure was then utilized to navigate to within 1.2cm of patient's RUL lesion identified on CT scan. The extendable working channel was secured into place and the locator guide was withdrawn. Under fluoroscopic guidance transbronchial brushings, transbronchial Wang needle biopsies, and transbronchial forceps biopsies were performed to be sent for cytology and pathology. A bronchioalveolar lavage was performed in the RUL and sent for cytology and microbiology (bacterial, fungal, AFB smears and cultures). At the end of the procedure a general airway inspection was performed and there was no evidence of active bleeding. The bronchoscope was removed.  The patient tolerated the procedure well. There was no significant blood loss and there were no obvious complications. A post-procedural chest x-ray is pending.  Samples: 1. Transbronchial brushings from RUL 2. Transbronchial Wang needle biopsies from RUL 3. Transbronchial forceps biopsies from RUL 4. Bronchoalveolar lavage from RUL   Plans:  The patient will be discharged from the PACU to home when recovered from anesthesia and after chest x-ray is reviewed. We will review the cytology, pathology and microbiology results with the patient when they become available. Outpatient followup will be with Dr Delton Coombes and Dr Sherene Sires.    Levy Pupa, MD, PhD 10/19/2012, 9:53 AM [facility] [phone] or if no answer [phone]

## 2012-10-19 NOTE — H&P (Signed)
PULMONARY  / CRITICAL CARE MEDICINE  Name: James [name] MRN: [id number] DOB: 08-15-1950    ADMISSION DATE:  10/19/2012 CONSULTATION DATE:  6/11  PRIMARY SERVICE:  PULM  CHIEF COMPLAINT:  Right PTX   BRIEF PATIENT DESCRIPTION:  This is 6 YOM who underwent Flexible video fiberoptic bronchoscopy with electromagnetic navigation and biopsies on 6/11. Post-op c/b right PTX. Emergent Wayne cath placed.  Admitted for management of right PTX.   SIGNIFICANT EVENTS / STUDIES:  6/11 Flexible video fiberoptic bronchoscopy with electromagnetic navigation and biopsies 1. Transbronchial brushings from RUL >>> 2. Transbronchial Wang needle biopsies from RUL >>> 3. Transbronchial forceps biopsies from RUL>>> 4. Bronchoalveolar lavage from RUL >>>  LINES / TUBES: Right Wayne cath 6/11>>>  CULTURES:   ANTIBIOTICS:   HISTORY OF PRESENT ILLNESS:   This is 68 YOM who underwent Flexible video fiberoptic bronchoscopy with electromagnetic navigation and biopsies on 6/11. Post-op c/b right PTX. Emergent Wayne cath placed.  Admitted for management of right PTX.  PAST MEDICAL HISTORY :  Past Medical History  Diagnosis Date  . Wrist pain, left   . Back pain   . Depressive disorder, not elsewhere classified   . Fasting hyperglycemia   . Special screening for malignant neoplasm of prostate   . Routine general medical examination at a health care facility   . Spinal stenosis, unspecified region other than cervical   . GERD (gastroesophageal reflux disease)   . Colon polyps     10/27/2002, repeat letter 09/17/2007  . Other and unspecified hyperlipidemia   . MPN (myeloproliferative neoplasm)     1st detected 06/04/1998  . Hemoptysis     abnormal CT Chest 01/29/10 - ? new GG changes RUL > not viz on plain cxr 02/26/2010  . Positive PPD     per pt  . Obesity   . OSA (obstructive sleep apnea)     cpap  . Arthritis    Past Surgical History  Procedure Laterality Date  . Tonsillectomy    . Anterior  cruciate ligament repair      LEFT at 15  . Wrist surgery  12/2009    Reconstruction, [name]  . Cardiac catheterization      no PCI  . Colonoscopy w/ polypectomy     Prior to Admission medications   Medication Sig Start Date End Date Taking? Authorizing Provider  Flaxseed, Linseed, (FLAXSEED OIL PO) Take 10 mLs by mouth daily. Takes about 2 teaspoons=23ml daily   Yes Historical Provider, MD  Krill Oil 500 MG CAPS Take 1 capsule by mouth daily.   Yes Historical Provider, MD  Multiple Vitamin (MULTIVITAMIN WITH MINERALS) TABS Take 1 tablet by mouth daily.   Yes Historical Provider, MD  naproxen sodium (ANAPROX) 220 MG tablet Take 220-440 mg by mouth 4 (four) times daily as needed. For pain   Yes Historical Provider, MD  Sodium Bicarbonate-Citric Acid (ALKA-SELTZER HEARTBURN PO) Take 1 tablet by mouth at bedtime as needed. For heartburn   Yes Historical Provider, MD  cyclobenzaprine (FLEXERIL) 5 MG tablet Take 5-10 mg by mouth every 8 (eight) hours as needed. For muscle spasms    Historical Provider, MD  UNABLE TO FIND Cinnamon 1/2 teaspoon daily    Historical Provider, MD   Allergies  Allergen Reactions  . Amoxicillin Itching    FAMILY HISTORY:  Family History  Problem Relation Age of Onset  . Heart disease Father   . Heart disease Paternal Uncle   . Prostate cancer Neg Hx   .  Colon cancer Neg Hx   . Hypertension Neg Hx   . Hyperlipidemia Neg Hx   . Diabetes Neg Hx    SOCIAL HISTORY:  reports that he has never smoked. He has never used smokeless tobacco. He reports that he drinks about 0.6 ounces of alcohol per week. He reports that he does not use illicit drugs.  REVIEW OF SYSTEMS:   Unable   SUBJECTIVE:  Appears more comfortable   VITAL SIGNS: Temp:  [98 F (36.7 C)-98.5 F (36.9 C)] 98 F (36.7 C) (06/11 0945) Pulse Rate:  [60-77] 63 (06/11 1045) Resp:  [14-21] 14 (06/11 1045) BP: (113-139)/(63-79) 113/67 mmHg (06/11 1045) SpO2:  [88 %-97 %] 91 % (06/11  1045)  PHYSICAL EXAMINATION: General:  Sleepy, s/p versed. Follows commands but sp slurred  Neuro:  Sleepy no focal def  HEENT:  Neck large  Cardiovascular:  rrr Lungs:  BS decreased w/ occ rhonchi. Right Wayne cath w/ 1/7 airleak Abdomen:  Soft, non-tender  Musculoskeletal:  Intact  Skin: intact    Recent Labs Lab 10/13/12 1446  NA 141  K 4.4  CL 106  CO2 27  BUN 18  CREATININE 1.07  GLUCOSE 99    Recent Labs Lab 10/13/12 1446  HGB 16.3  HCT 46.9  WBC 6.8  PLT 189   Dg Chest 2 View  10/19/2012   *RADIOLOGY REPORT*  Clinical Data: Preop bronchoscopy.  CHEST - 2 VIEW  Comparison: 09/21/2012 and CT chest 09/12/2012.  Findings: Trachea is midline.  Heart size normal. A known right upper lobe nodule is poorly visualized.  Lungs are mildly hyperinflated but otherwise clear.  No pleural fluid.  No degenerative changes are seen in the spine.  IMPRESSION: Known right upper lobe nodule is poorly visualized.   Original Report Authenticated By: Leanna Battles, M.D.   Dg Chest Port 1 View  10/19/2012   *RADIOLOGY REPORT*  Clinical Data: Status post bronchoscopy with biopsies.  PORTABLE CHEST - 1 VIEW  Comparison: 10/19/2012  Findings: Right-sided pneumothorax measures 50-60%.  Heart size is normal.  No pleural effusion or edema.  Left lung is clear.  IMPRESSION:  1. Large right-sided pneumothorax after bronchoscopy.  Critical Value/emergent results were called by telephone at the time of interpretation on 10/19/2012  at 11:35 a.m. to Dr. Delton Coombes, who verbally acknowledged these results.   Original Report Authenticated By: Signa Kell, M.D.  CT in good position. Not complete Resolution of PTX (about 15% after CT placement).   ASSESSMENT / PLAN: Iatrogenic Right PTX Plan Wayne cath to 20 cmH2O Serial CXR  Right Upper lobe GG nodule Plan -f/u path    Levy Pupa, MD, PhD 10/19/2012, 11:26 AM [facility] [phone] or if no answer [phone]

## 2012-10-19 NOTE — Procedures (Signed)
Procedure Note  Wayne catheter placement into R pleural space via Seldinger technique. 1% lidocaine used. Dilaudid 1mg  + versed 4mg  for comfort and sedation. No complications noted. R PTX partially resolved on f/u CXR.   Levy Pupa, MD, PhD 10/19/2012, 1:37 PM  [facility] [phone] or if no answer [phone]

## 2012-10-20 ENCOUNTER — Inpatient Hospital Stay (HOSPITAL_COMMUNITY): Payer: BC Managed Care – PPO

## 2012-10-20 ENCOUNTER — Encounter (HOSPITAL_COMMUNITY): Payer: Self-pay | Admitting: Emergency Medicine

## 2012-10-20 LAB — GLUCOSE, CAPILLARY: Glucose-Capillary: 161 mg/dL — ABNORMAL HIGH (ref 70–99)

## 2012-10-20 MED ORDER — OXYCODONE HCL 5 MG PO TABS
5.0000 mg | ORAL_TABLET | ORAL | Status: DC | PRN
  Administered 2012-10-20: 5 mg via ORAL
  Administered 2012-10-21 – 2012-10-22 (×3): 10 mg via ORAL
  Administered 2012-10-22 (×2): 5 mg via ORAL
  Administered 2012-10-22: 10 mg via ORAL
  Administered 2012-10-23: 5 mg via ORAL
  Filled 2012-10-20 (×2): qty 1
  Filled 2012-10-20 (×2): qty 2
  Filled 2012-10-20: qty 1
  Filled 2012-10-20 (×3): qty 2
  Filled 2012-10-20: qty 1

## 2012-10-20 NOTE — Progress Notes (Addendum)
PULMONARY  / CRITICAL CARE MEDICINE  Name: JOSHUE BADAL MRN: [id number] DOB: 1950-06-30    ADMISSION DATE:  10/19/2012 CONSULTATION DATE:  6/11  PRIMARY SERVICE:  PULM  CHIEF COMPLAINT:  Right PTX   BRIEF PATIENT DESCRIPTION:  This is 50 YOM who underwent Flexible video fiberoptic bronchoscopy with electromagnetic navigation and biopsies on 6/11. Post-op c/b right PTX. Emergent Wayne cath placed.  Admitted for management of right PTX.   SIGNIFICANT EVENTS / STUDIES:  6/11 Flexible video fiberoptic bronchoscopy with electromagnetic navigation and biopsies 1. Transbronchial brushings from RUL >>> 2. Transbronchial Wang needle biopsies from RUL >>> 3. Transbronchial forceps biopsies from RUL>>> 4. Bronchoalveolar lavage from RUL >>>  LINES / TUBES: Right Wayne cath 6/11>>>  CULTURES:  ANTIBIOTICS:  HISTORY OF PRESENT ILLNESS:   This is 21 YOM who underwent Flexible video fiberoptic bronchoscopy with electromagnetic navigation and biopsies on 6/11. Post-op c/b right PTX. Emergent Wayne cath placed.  Admitted for management of right PTX.   SUBJECTIVE:  Up in chair, having pleuritic pain with movement and deep inspiration  VITAL SIGNS: Temp:  [97 F (36.1 C)-98.8 F (37.1 C)] 98.8 F (37.1 C) (06/12 0951) Pulse Rate:  [53-67] 60 (06/12 0951) Resp:  [14-21] 18 (06/12 0951) BP: (113-139)/(55-83) 139/76 mmHg (06/12 0951) SpO2:  [88 %-99 %] 98 % (06/12 0951) Weight:  [107.049 kg (236 lb)] 107.049 kg (236 lb) (06/11 1700)  PHYSICAL EXAMINATION: General:  Sleepy, s/p versed. Follows commands but sp slurred  Neuro:  Sleepy no focal def  HEENT:  Neck large  Cardiovascular:  rrr Lungs:  BS decreased w/ occ rhonchi. Right Wayne cath w/ 1/7 airleak Abdomen:  Soft, non-tender  Musculoskeletal:  Intact  Skin: intact    Recent Labs Lab 10/13/12 1446 10/19/12 1544  NA 141 138  K 4.4 4.8  CL 106 105  CO2 27 25  BUN 18 20  CREATININE 1.07 1.06  GLUCOSE 99 103*    Recent  Labs Lab 10/13/12 1446 10/19/12 1544  HGB 16.3 15.3  HCT 46.9 44.5  WBC 6.8 10.2  PLT 189 173   Dg Chest 2 View  10/19/2012   *RADIOLOGY REPORT*  Clinical Data: Preop bronchoscopy.  CHEST - 2 VIEW  Comparison: 09/21/2012 and CT chest 09/12/2012.  Findings: Trachea is midline.  Heart size normal. A known right upper lobe nodule is poorly visualized.  Lungs are mildly hyperinflated but otherwise clear.  No pleural fluid.  No degenerative changes are seen in the spine.  IMPRESSION: Known right upper lobe nodule is poorly visualized.   Original Report Authenticated By: Leanna Battles, M.D.   Portable Chest 1 View  10/20/2012   *RADIOLOGY REPORT*  Clinical Data: Chest tube position, right pneumothorax.  PORTABLE CHEST - 1 VIEW  Comparison: 10/19/2012 and CT chest 09/12/2012.  Findings: Trachea is midline.  Heart size stable.  Tiny right apical pneumothorax, decreased in size from yesterday's exam, with a pigtail chest drain in place at the base of the right hemithorax. Nodular density is seen in the right upper lobe.  Mild bibasilar subsegmental atelectasis.  Subcutaneous emphysema is seen along the lower right chest wall.  IMPRESSION:  1.  Tiny residual right pneumothorax with right chest tube in place. 2.  Right upper lobe nodule. 3.  Bibasilar subsegmental atelectasis.   Original Report Authenticated By: Leanna Battles, M.D.   Dg Chest Port 1 View  10/19/2012   *RADIOLOGY REPORT*  Clinical Data: Evaluate for pneumothorax  PORTABLE CHEST - 1 VIEW  Comparison: Earlier same day; 09/21/2012; chest CT - 09/12/2012  Findings:  Post right-sided pigtail chest tube catheter placement with subsequent reduction in persistent small right-sided pneumothorax.  Grossly unchanged with borderline enlarged cardiac silhouette and mediastinal contours given reduced lung volumes.  Worsening bilateral perihilar and left basilar heterogeneous opacities favored to represent atelectasis.  No definite pleural effusion. Grossly  unchanged bones.  IMPRESSION: 1.  Interval reduction in now small right-sided pneumothorax post right-sided chest tube placement. 2.   Hypoventilated examination with worsening bilateral opacities favored to represent atelectasis.   Original Report Authenticated By: Tacey Ruiz, MD   Dg Chest Port 1 View  10/19/2012   *RADIOLOGY REPORT*  Clinical Data: Status post bronchoscopy with biopsies.  PORTABLE CHEST - 1 VIEW  Comparison: 10/19/2012  Findings: Right-sided pneumothorax measures 50-60%.  Heart size is normal.  No pleural effusion or edema.  Left lung is clear.  IMPRESSION:  1. Large right-sided pneumothorax after bronchoscopy.  Critical Value/emergent results were called by telephone at the time of interpretation on 10/19/2012  at 11:35 a.m. to Dr. Delton Coombes, who verbally acknowledged these results.   Original Report Authenticated By: Signa Kell, M.D.   Dg C-arm Bronchoscopy  10/19/2012   CLINICAL DATA: endobronchial navigation   C-ARM BRONCHOSCOPY  Fluoroscopy was utilized by the requesting physician.  No radiographic  interpretation.     ASSESSMENT / PLAN: Iatrogenic Right PTX, small residual PTX on CXR 6/12 (on suction) Plan Water seal now Follow clinically and by CXR this pm and in am; hopefully d/c tube 6/13 if stable  Right Upper lobe GG nodule Plan -f/u path, pending  OSA on CPAP Hold off on restarting CPAP for now to avoid positive pressure and persistent air leak    Levy Pupa, MD, PhD 10/20/2012, 10:12 AM [place] [facility] [phone] or if no answer [phone]

## 2012-10-21 ENCOUNTER — Inpatient Hospital Stay (HOSPITAL_COMMUNITY): Payer: BC Managed Care – PPO

## 2012-10-21 LAB — GLUCOSE, CAPILLARY: Glucose-Capillary: 119 mg/dL — ABNORMAL HIGH (ref 70–99)

## 2012-10-21 LAB — CULTURE, RESPIRATORY W GRAM STAIN

## 2012-10-21 LAB — CULTURE, RESPIRATORY: Culture: NO GROWTH

## 2012-10-21 MED ORDER — FENTANYL CITRATE 0.05 MG/ML IJ SOLN
INTRAMUSCULAR | Status: AC
  Administered 2012-10-21: 50 ug
  Filled 2012-10-21: qty 2

## 2012-10-21 MED ORDER — FENTANYL CITRATE 0.05 MG/ML IJ SOLN
25.0000 ug | INTRAMUSCULAR | Status: DC | PRN
  Administered 2012-10-21 (×2): 25 ug via INTRAVENOUS
  Filled 2012-10-21 (×2): qty 2

## 2012-10-21 MED ORDER — MIDAZOLAM HCL 2 MG/2ML IJ SOLN
INTRAMUSCULAR | Status: AC
  Filled 2012-10-21: qty 4

## 2012-10-21 MED ORDER — MORPHINE SULFATE 2 MG/ML IJ SOLN: 2.0000 mg | INTRAMUSCULAR | Status: DC | PRN

## 2012-10-21 MED ORDER — MAGNESIUM HYDROXIDE 400 MG/5ML PO SUSP
30.0000 mL | Freq: Every day | ORAL | Status: DC | PRN
  Administered 2012-10-21: 30 mL via ORAL
  Filled 2012-10-21: qty 30

## 2012-10-21 MED ORDER — MIDAZOLAM HCL 2 MG/2ML IJ SOLN
INTRAMUSCULAR | Status: AC
  Administered 2012-10-21: 6 mg
  Filled 2012-10-21: qty 4

## 2012-10-21 NOTE — Discharge Summary (Signed)
error 

## 2012-10-21 NOTE — Progress Notes (Signed)
Patient received to room 3314 via wheelchair from 6N on room air. Wife is at the bedside. Vitals obtained and informed the patient and wife that the MD would be replacing the chest tube in the room. Darlin Coco in to see patient and informed patient that he would be placing the chest tube. Dr. Lucious Groves also present. Consent signed, and patient placed on 100% non rebreather. Patient has received a total of 6mg . Of versed IV as well as of fentanyl IV. Patient tolerated procedure well.No air leak noted and chest tube has been placed to 20cm of wall suction. Waiting for portable chest xray.

## 2012-10-21 NOTE — Significant Event (Signed)
Pt had chest tube removed earlier today.  Follow up CXR showed 20% PTX.  Pt transferred to SDU and [facility] catheter inserted.  Will place to -20 cm H2O suction, and f/u CXR.  Updated pt's wife about plan.  Coralyn Helling, MD [facility] 10/21/2012, 4:21 PM Pager:  [phone] After 3pm call: [phone]

## 2012-10-21 NOTE — Procedures (Signed)
Chest Tube Insertion Procedure Note  Indications:  Clinically significant Pneumothorax  Pre-operative Diagnosis: Pneumothorax  Post-operative Diagnosis: Pneumothorax  Procedure Details  Informed consent was obtained for the procedure, including sedation.  Risks of lung perforation, hemorrhage, arrhythmia, and adverse drug reaction were discussed.   After sterile skin prep, using standard technique, a Wayne catheter inserted into Rt 5th rib space in mid-axillary line w/o difficulty.  Attending Attestation: I was present for the entire procedure.  Performed by Anders Simmonds, ACNP.  Coralyn Helling, MD [facility] Pulmonary/[facility] Care 10/21/2012, 4:19 PM Pager:  [phone] After 3pm call: [phone]

## 2012-10-21 NOTE — Progress Notes (Signed)
Report called to [facility] 3300.

## 2012-10-21 NOTE — Progress Notes (Signed)
PULMONARY  / CRITICAL CARE MEDICINE  Name: James [name] MRN: [id number] DOB: January 01, 1951    ADMISSION DATE:  10/19/2012 CONSULTATION DATE:  6/11  PRIMARY SERVICE:  PULM  CHIEF COMPLAINT:  Right PTX   BRIEF PATIENT DESCRIPTION:  This is 62 YOM who underwent Flexible video fiberoptic bronchoscopy with electromagnetic navigation and biopsies on 6/11. Post-op c/b right PTX. Emergent Wayne cath placed.  Admitted for management of right PTX.   SIGNIFICANT EVENTS / STUDIES:  6/11 Flexible video fiberoptic bronchoscopy with electromagnetic navigation and biopsies 1. Transbronchial brushings from RUL >>> 2. Transbronchial Wang needle biopsies from RUL >>> 3. Transbronchial forceps biopsies from RUL>>> 4. Bronchoalveolar lavage from RUL >>>  LINES / TUBES: Right Wayne cath 6/11>>>  CULTURES:  ANTIBIOTICS:  HISTORY OF PRESENT ILLNESS:   This is 62 YOM who underwent Flexible video fiberoptic bronchoscopy with electromagnetic navigation and biopsies on 6/11. Post-op c/b right PTX. Emergent Wayne cath placed.  Admitted for management of right PTX.   SUBJECTIVE:  Up in chair, having pleuritic pain with movement and deep inspiration No dyspnea  VITAL SIGNS: Temp:  [97.9 F (36.6 C)-98.9 F (37.2 C)] 98.3 F (36.8 C) (06/13 0525) Pulse Rate:  [51-63] 63 (06/13 0525) Resp:  [18-20] 18 (06/13 0525) BP: (118-133)/(64-74) 133/64 mmHg (06/13 0525) SpO2:  [97 %-98 %] 98 % (06/13 0525)  PHYSICAL EXAMINATION: General:  Sleepy, s/p versed. Follows commands but sp slurred  Neuro:  Sleepy no focal def  HEENT:  Neck large  Cardiovascular:  rrr Lungs:  BS decreased w/ occ rhonchi. Right Wayne cath w/ 1/7 airleak Abdomen:  Soft, non-tender  Musculoskeletal:  Intact  Skin: intact    Recent Labs Lab 10/19/12 1544  NA 138  K 4.8  CL 105  CO2 25  BUN 20  CREATININE 1.06  GLUCOSE 103*    Recent Labs Lab 10/19/12 1544  HGB 15.3  HCT 44.5  WBC 10.2  PLT 173   Dg Chest 2  View  10/21/2012   *RADIOLOGY REPORT*  Clinical Data: 62 year old male with history of pneumothorax status post bronchoscopic lung biopsy.  Chest tube.  CHEST - 2 VIEW  Comparison: 10/20/2012 and earlier.  Findings: Upright PA and lateral view.  Pigtail right chest tube remains in place.  No pneumothorax identified.  Continued right perihilar and left basilar atelectasis.  No pleural effusion. Cardiac size and mediastinal contours are within normal limits. Visualized tracheal air column is within normal limits.  Stable visualized osseous structures.  IMPRESSION: 1.  No pneumothorax identified.  Pigtail right chest tube remains in place. 2.  Mild atelectasis.   Original Report Authenticated By: Erskine Speed, M.D.   Dg Chest Port 1 View  10/20/2012   *RADIOLOGY REPORT*  Clinical Data: Follow up pneumothorax  PORTABLE CHEST - 1 VIEW  Comparison: 10/20/2012  Findings: Pigtail catheter overlying the right lung base unchanged. Negative for pneumothorax.  Mild bibasilar atelectasis.  Negative for heart failure or effusion.  IMPRESSION: Negative for pneumothorax.  Right basilar chest tube remains in place.   Original Report Authenticated By: Janeece Riggers, M.D.   Portable Chest 1 View  10/20/2012   *RADIOLOGY REPORT*  Clinical Data: Chest tube position, right pneumothorax.  PORTABLE CHEST - 1 VIEW  Comparison: 10/19/2012 and CT chest 09/12/2012.  Findings: Trachea is midline.  Heart size stable.  Tiny right apical pneumothorax, decreased in size from yesterday's exam, with a pigtail chest drain in place at the base of the right hemithorax. Nodular density is  seen in the right upper lobe.  Mild bibasilar subsegmental atelectasis.  Subcutaneous emphysema is seen along the lower right chest wall.  IMPRESSION:  1.  Tiny residual right pneumothorax with right chest tube in place. 2.  Right upper lobe nodule. 3.  Bibasilar subsegmental atelectasis.   Original Report Authenticated By: Leanna Battles, M.D.    ASSESSMENT /  PLAN: Iatrogenic Right PTX, resolved on water seal 6/13 am Plan Remove chest tube now Repeat CXR in 2-3 hours If no significant PTX on repeat CXr and pt feels well then d/c to home  Right Upper lobe GG nodule, path all normal Plan We discussed results today. Plan will be repeat CT scan in 6 months. No new intervention is CT stable. If the lesion grows then we will probably get PET scan and refer him to [facility] for resection  OSA on CPAP Restart CPAP once he gets home    Levy Pupa, MD, PhD 10/21/2012, 11:03 AM [place] [facility] [phone] or if no answer [phone]

## 2012-10-22 ENCOUNTER — Inpatient Hospital Stay (HOSPITAL_COMMUNITY): Payer: BC Managed Care – PPO

## 2012-10-22 DIAGNOSIS — E669 Obesity, unspecified: Secondary | ICD-10-CM

## 2012-10-22 LAB — GLUCOSE, CAPILLARY: Glucose-Capillary: 121 mg/dL — ABNORMAL HIGH (ref 70–99)

## 2012-10-22 MED ORDER — SENNOSIDES-DOCUSATE SODIUM 8.6-50 MG PO TABS
1.0000 | ORAL_TABLET | Freq: Every day | ORAL | Status: DC
  Administered 2012-10-22 – 2012-10-23 (×2): 1 via ORAL
  Filled 2012-10-22 (×4): qty 1

## 2012-10-22 MED ORDER — MAGNESIUM HYDROXIDE 400 MG/5ML PO SUSP
30.0000 mL | Freq: Every day | ORAL | Status: DC | PRN
  Filled 2012-10-22: qty 30

## 2012-10-22 NOTE — Progress Notes (Signed)
PULMONARY  / CRITICAL CARE MEDICINE  Name: James [name] MRN: [id number] DOB: 04-18-1951    ADMISSION DATE:  10/19/2012 CONSULTATION DATE:  6/11  PRIMARY SERVICE:  PULM  CHIEF COMPLAINT:  Right PTX   BRIEF PATIENT DESCRIPTION:  This is 1 YOM who underwent Flexible video fiberoptic bronchoscopy with electromagnetic navigation and biopsies on 6/11. Post-op c/b right PTX. Emergent Wayne cath placed.  Admitted for management of right PTX.   SIGNIFICANT EVENTS / STUDIES:  6/11 Flexible video fiberoptic bronchoscopy with electromagnetic navigation and biopsies 1. Transbronchial brushings from RUL >>>  2. Transbronchial Wang needle biopsies from RUL >>> 3. Transbronchial forceps biopsies from RUL>>> scant benign lung parenchyma 4. Bronchoalveolar lavage from RUL >>> all benign  LINES / TUBES: Right Wayne cath 6/11>>>  CULTURES:  ANTIBIOTICS:  HISTORY OF PRESENT ILLNESS:   This is 74 YOM who underwent Flexible video fiberoptic bronchoscopy with electromagnetic navigation and biopsies on 6/11. Post-op c/b right PTX. Emergent Wayne cath placed.  Admitted for management of right PTX.   SUBJECTIVE:  Up in chair, having pleuritic pain with movement and deep inspiration No dyspnea  VITAL SIGNS: Temp:  [97.4 F (36.3 C)-98.6 F (37 C)] 98.6 F (37 C) (06/14 0700) Pulse Rate:  [54-96] 54 (06/14 0400) Resp:  [15-24] 18 (06/14 0400) BP: (129-173)/(65-98) 138/74 mmHg (06/14 0400) SpO2:  [97 %-100 %] 97 % (06/14 0400)  PHYSICAL EXAMINATION: General:  Sleepy, s/p versed. Follows commands but sp slurred  Neuro:  Sleepy no focal def  HEENT:  Neck large  Cardiovascular:  rrr Lungs:  BS decreased w/ occ rhonchi. Right Wayne cath w/ 1/7 airleak Abdomen:  Soft, non-tender  Musculoskeletal:  Intact  Skin: intact    Recent Labs Lab 10/19/12 1544  NA 138  K 4.8  CL 105  CO2 25  BUN 20  CREATININE 1.06  GLUCOSE 103*    Recent Labs Lab 10/19/12 1544  HGB 15.3  HCT 44.5  WBC  10.2  PLT 173   IMAGING STUDIES:  10/21/12>  IMPRESSION: 1. No pneumothorax identified. Pigtail right chest tube remains in place. 2. Mild atelectasis.  10/21/12>  IMPRESSION: Recurrent right pneumothorax following right thoracostomy tube removal, estimated at 20%.  10/22/12>  Pigtail cath in good position & lung expanded w/o residual pneumothorax...    ASSESSMENT / PLAN: Iatrogenic Right PTX, resolved on water seal 6/13 am, when tube removed 20%Pneumoth recurred, pigtail cath replaced & placed on suction, lung now re-expanded & clear... Plan -continue chest tube suction -pain meds prn -incentive spirometer  Right Upper lobe GG nodule=> path all normal Plan We discussed results today. Plan will be repeat CT scan in 6 months. No new intervention is CT stable. If the lesion grows then we will probably get PET scan and refer him to TCTS for resection  OSA on CPAP Restart CPAP once he gets home    [place]. Kriste Basque, MD  10/22/2012, 8:14 AM

## 2012-10-23 MED ORDER — TRAMADOL HCL 50 MG PO TABS
50.0000 mg | ORAL_TABLET | Freq: Four times a day (QID) | ORAL | Status: DC | PRN
  Administered 2012-10-23 (×2): 100 mg via ORAL
  Filled 2012-10-23 (×2): qty 2

## 2012-10-23 MED ORDER — MAGNESIUM CITRATE PO SOLN
0.5000 | Freq: Once | ORAL | Status: AC
  Administered 2012-10-23: 0.5 via ORAL
  Filled 2012-10-23: qty 296

## 2012-10-23 NOTE — Progress Notes (Signed)
PULMONARY  / CRITICAL CARE MEDICINE  Name: DEMARCUS THIELKE MRN: [id number] DOB: April 22, 1951    ADMISSION DATE:  10/19/2012 CONSULTATION DATE:  6/11  PRIMARY SERVICE:  PULM  CHIEF COMPLAINT:  Right PTX   BRIEF PATIENT DESCRIPTION:  This is 8 YOM who underwent Flexible video fiberoptic bronchoscopy with electromagnetic navigation and biopsies on 6/11. Post-op c/b right PTX. Emergent Wayne cath placed.  Admitted for management of right PTX.   SIGNIFICANT EVENTS / STUDIES:  6/11 Flexible video fiberoptic bronchoscopy with electromagnetic navigation and biopsies 1. Transbronchial brushings from RUL >>>  2. Transbronchial Wang needle biopsies from RUL >>> 3. Transbronchial forceps biopsies from RUL>>> scant benign lung parenchyma 4. Bronchoalveolar lavage from RUL >>> all benign  LINES / TUBES: Right Wayne cath 6/11>> removed & replaced 6/13>>  CULTURES:  ANTIBIOTICS:  HISTORY OF PRESENT ILLNESS:   This is 17 YOM who underwent Flexible video fiberoptic bronchoscopy with electromagnetic navigation and biopsies on 6/11. Post-op c/b right PTX. Emergent Wayne cath placed.  Admitted for management of right PTX.   SUBJECTIVE:  Up in chair, having pleuritic pain with movement and deep inspiration No dyspnea  VITAL SIGNS: Temp:  [98.2 F (36.8 C)-100 F (37.8 C)] 98.2 F (36.8 C) (06/15 0309) Pulse Rate:  [51-69] 51 (06/15 0309) Resp:  [13-20] 14 (06/15 0309) BP: (117-143)/(71-82) 117/71 mmHg (06/15 0309) SpO2:  [95 %-97 %] 96 % (06/15 0309)  PHYSICAL EXAMINATION: General:  Sleepy, s/p versed. Follows commands but sp slurred  Neuro:  Sleepy no focal def  HEENT:  Neck large  Cardiovascular:  rrr Lungs:  BS decreased w/ occ rhonchi. Right Wayne cath w/ 1/7 airleak Abdomen:  Soft, non-tender  Musculoskeletal:  Intact  Skin: intact    Recent Labs Lab 10/19/12 1544  NA 138  K 4.8  CL 105  CO2 25  BUN 20  CREATININE 1.06  GLUCOSE 103*    Recent Labs Lab 10/19/12 1544    HGB 15.3  HCT 44.5  WBC 10.2  PLT 173   IMAGING STUDIES:  10/21/12>  IMPRESSION: 1. No pneumothorax identified. Pigtail right chest tube remains in place. 2. Mild atelectasis. Catheter removed by DrByrum> 10/21/12>  IMPRESSION: Recurrent right pneumothorax following right thoracostomy tube removal, estimated at 20%. Pigtail catheter replaced> & placed on suction... 10/22/12>  Pigtail cath in good position & lung expanded w/o residual pneumothorax...    ASSESSMENT / PLAN: Iatrogenic Right PTX, resolved on water seal 6/13 am, when tube removed 20%Pneumoth recurred, pigtail cath replaced & placed on suction, lung now re-expanded & clear... Plan -continue chest tube suction -pain meds prn -incentive spirometer -in AM 6/16> DC suction on tube, repeat CXR, plans for pulling catheter per CCM...  Right Upper lobe GG nodule=> path all normal Plan We discussed results today. Plan will be repeat CT scan in 6 months. No new intervention is CT stable. If the lesion grows then we will probably get PET scan and refer him to TCTS for resection  OSA on CPAP Restart CPAP once he gets home    [place]. Kriste Basque, MD  10/23/2012, 7:23 AM

## 2012-10-24 ENCOUNTER — Inpatient Hospital Stay: Payer: BC Managed Care – PPO | Admitting: Pulmonary Disease

## 2012-10-24 ENCOUNTER — Inpatient Hospital Stay (HOSPITAL_COMMUNITY): Payer: BC Managed Care – PPO

## 2012-10-24 NOTE — Progress Notes (Signed)
PULMONARY  / CRITICAL CARE MEDICINE  Name: James [name] MRN: [id number] DOB: [birth year]    ADMISSION DATE:  10/19/2012 CONSULTATION DATE:  6/11  PRIMARY SERVICE:  PULM  CHIEF COMPLAINT:  Right PTX   BRIEF PATIENT DESCRIPTION:  This is 86 YOM who underwent Flexible video fiberoptic bronchoscopy with electromagnetic navigation and biopsies on 6/11. Post-op c/b right PTX. Emergent Wayne cath placed.  Admitted for management of right PTX.   SIGNIFICANT EVENTS / STUDIES:  6/11 Flexible video fiberoptic bronchoscopy with electromagnetic navigation and biopsies 1. Transbronchial brushings from RUL >>>  2. Transbronchial Wang needle biopsies from RUL >>> 3. Transbronchial forceps biopsies from RUL>>> scant benign lung parenchyma 4. Bronchoalveolar lavage from RUL >>> all benign  LINES / TUBES: Right Wayne cath 6/11>> removed & replaced 6/13>>  CULTURES:  ANTIBIOTICS:  HISTORY OF PRESENT ILLNESS:   This is 55 YOM who underwent Flexible video fiberoptic bronchoscopy with electromagnetic navigation and biopsies on 6/11. Post-op c/b right PTX. Emergent Wayne cath placed.  Admitted for management of right PTX.   SUBJECTIVE:  Up in chair, having pleuritic pain with movement and deep inspiration No dyspnea  VITAL SIGNS: Temp:  [97.6 F (36.4 C)-98.3 F (36.8 C)] 98.2 F (36.8 C) (06/16 0727) Pulse Rate:  [60-68] 60 (06/16 0502) Resp:  [15-18] 15 (06/16 0502) BP: (121-141)/(65-80) 141/80 mmHg (06/16 0727) SpO2:  [96 %-98 %] 96 % (06/16 0502)  PHYSICAL EXAMINATION: General:  Sleepy, s/p versed. Follows commands but sp slurred  Neuro:  Sleepy no focal def  HEENT:  Neck large  Cardiovascular:  rrr Lungs:  BS decreased w/ occ rhonchi. Right Wayne cath w/ 1/7 airleak Abdomen:  Soft, non-tender  Musculoskeletal:  Intact  Skin: intact    Recent Labs Lab 10/19/12 1544  NA 138  K 4.8  CL 105  CO2 25  BUN 20  CREATININE 1.06  GLUCOSE 103*    Recent Labs Lab 10/19/12 1544   HGB 15.3  HCT 44.5  WBC 10.2  PLT 173   IMAGING STUDIES: 6/16  CXR: no PTX 1.5 hours after chest tube to water seal  ASSESSMENT / PLAN: Iatrogenic Right PTX, resolved on water seal 6/13 am, when tube removed 20%Pneumoth recurred, pigtail cath replaced & placed on suction, lung now re-expanded & clear...on water seal  Plan -cont CT to water seal., repeat CXR at 12N if still no PTX, pull CT   Right Upper lobe GG nodule=> path all normal Plan  Plan will be repeat CT scan in 6 months. No new intervention is CT stable. If the lesion grows then we will probably get PET scan and refer him to TCTS for resection  OSA on CPAP Restart CPAP once he gets home    Caryl Bis  [phone]  Cell  [phone]  If no response or cell goes to voicemail, call beeper [phone]   10/24/2012, 9:58 AM

## 2012-10-25 ENCOUNTER — Telehealth: Payer: Self-pay | Admitting: Emergency Medicine

## 2012-10-25 ENCOUNTER — Inpatient Hospital Stay (HOSPITAL_COMMUNITY): Payer: BC Managed Care – PPO

## 2012-10-25 NOTE — Care Management Note (Signed)
    Page 1 of 1   10/25/2012     11:05:45 AM   CARE MANAGEMENT NOTE 10/25/2012  Patient:  James [name], James [name]   Account Number:  [id number]  Date Initiated:  10/24/2012  Documentation initiated by:  Donn Pierini  Subjective/Objective Assessment:   Pt admitted with pntx- with chest tube placement     Action/Plan:   PTA pt lived at home with spouse- anticipate return home   Anticipated DC Date:  10/25/2012   Anticipated DC Plan:  HOME W HOME HEALTH SERVICES      DC Planning Services  CM consult      Choice offered to / List presented to:             Status of service:  Completed, signed off Medicare Important Message given?   (If response is "NO", the following Medicare IM given date fields will be blank) Date Medicare IM given:   Date Additional Medicare IM given:    Discharge Disposition:  HOME/SELF CARE  Per UR Regulation:  Reviewed for med. necessity/level of care/duration of stay  If discussed at Long Length of Stay Meetings, dates discussed:   10/25/2012    Comments:  10/25/12- 1100- Donn Pierini RN, BSN [phone] Pt d/c'd home today, chest tube was able to be removed on 10/24/12 and pt did not need any HH at time of discharge.

## 2012-10-25 NOTE — Discharge Summary (Signed)
Physician Discharge Summary     Patient ID: James [name] MRN: [id number] DOB/AGE: Apr 06, 1951 62 y.o.  Admit date: 10/19/2012 Discharge date: 10/25/2012  Discharge Diagnoses:  Principal Problem:   Pulmonary nodule, right Active Problems:   PULMONARY NODULE   Pneumothorax after biopsy     Discharge Plan by diagnoses   Discharge Plan: Pt to follow up on 11/10/12 .  Plan is to follow nodule for now. Note biopsy neg for CA or other pathology  Detailed Hospital Course:   BRIEF PATIENT DESCRIPTION:  [name] who underwent Flexible video fiberoptic bronchoscopy with electromagnetic navigation and biopsies on 6/11. Post-op c/b right PTX. Emergent Wayne cath placed. Admitted for management of right PTX.  SIGNIFICANT EVENTS / STUDIES:  6/11 Flexible video fiberoptic bronchoscopy with electromagnetic navigation and biopsies  1. Transbronchial brushings from RUL >>>  2. Transbronchial Wang needle biopsies from RUL >>>  3. Transbronchial forceps biopsies from RUL>>> scant benign lung parenchyma  4. Bronchoalveolar lavage from RUL >>> all benign  LINES / TUBES:  Right Wayne cath 6/11>> removed & replaced 6/13>>d/c 6/16  Pt seen AM of 10/25/12 and the pt is ready for d/c to home. CXR stable.    Discharge Exam: BP 120/68  Pulse 53  Temp(Src) 98 F (36.7 C) (Oral)  Resp 12  Ht 5\' 9"  (1.753 m)  Wt 107.049 kg (236 lb)  BMI 34.84 kg/m2  SpO2 98%  Normal exam. No acute abnormalities.  Labs at discharge Lab Results  Component Value Date   CREATININE 1.06 10/19/2012   BUN 20 10/19/2012   NA 138 10/19/2012   K 4.8 10/19/2012   CL 105 10/19/2012   CO2 25 10/19/2012   Lab Results  Component Value Date   WBC 10.2 10/19/2012   HGB 15.3 10/19/2012   HCT 44.5 10/19/2012   MCV 90.4 10/19/2012   PLT 173 10/19/2012   Lab Results  Component Value Date   ALT 45 10/19/2012   AST 31 10/19/2012   ALKPHOS 53 10/19/2012   BILITOT 0.6 10/19/2012   Lab Results  Component Value Date   INR  0.89 10/13/2012    Current radiology studies Dg Chest 2 View  10/25/2012   *RADIOLOGY REPORT*  Clinical Data: Right pneumothorax, chest discomfort, shortness of breath  CHEST - 2 VIEW  Comparison: 10/24/2012  Findings: Normal heart size, mediastinal contours, and pulmonary vascularity. Subsegmental atelectasis of left costophrenic angle. Lungs otherwise clear. No pleural effusion or pneumothorax. Minimal end plate spur formation thoracic spine.  IMPRESSION: Minimal left basilar atelectasis.   Original Report Authenticated By: Ulyses Southward, M.D.   Dg Chest Port 1 View  10/24/2012   *RADIOLOGY REPORT*  Clinical Data: Right-sided chest tube removal  PORTABLE CHEST - 1 VIEW  Comparison: Same day.  Findings: Cardiomediastinal silhouette appears normal. No pneumothorax is seen status post removal of right-sided chest tube. Lungs are clear.  Bony thorax is intact.  IMPRESSION: No pneumothorax status post right-sided chest tube removal.   Original Report Authenticated By: Lupita Raider.,  M.D.   Dg Chest Port 1 View  10/24/2012   *RADIOLOGY REPORT*  Clinical Data: Pneumothorax, chest tube  PORTABLE CHEST - 1 VIEW  Comparison: Prior chest x-ray earlier this morning at 05:53 a.m.  Findings: Eight of the position right pigtail thoracostomy tube remains in unchanged position.  No pneumothorax identified.  Stable mild cardiomegaly.  Central bronchitic changes and minimal bibasilar atelectasis are stable.  IMPRESSION: No definite right-sided pneumothorax with apically place pigtail  thoracostomy tube in stable position.   Original Report Authenticated By: Malachy Moan, M.D.   Dg Chest Port 1 View  10/24/2012   *RADIOLOGY REPORT*  Clinical Data: Status post right chest tube placement for pneumothorax.  PORTABLE CHEST - 1 VIEW  Comparison: 10/22/2012  Findings: Lateral right pigtail thoracostomy tube positioning is stable.  No pneumothorax is identified.  No pulmonary edema or significant pulmonary consolidation is  identified.  There is stable cardiomegaly.  IMPRESSION: No pneumothorax with stable positioning of right pigtail chest tube.   Original Report Authenticated By: Irish Lack, M.D.    Disposition:  d/c to home   Discharge Orders   Future Appointments Provider Department Dept Phone   11/10/2012 2:15 PM Leslye Peer, MD [facility] [phone]   Future Orders Complete By Expires     Discharge patient  As directed     Comments:      After recovered from anesthesia and CXR has been reviewed    Discharge patient  As directed         Medication List    TAKE these medications       ALKA-SELTZER HEARTBURN PO  Take 1 tablet by mouth at bedtime as needed. For heartburn     cyclobenzaprine 5 MG tablet  Commonly known as:  FLEXERIL  Take 5-10 mg by mouth every 8 (eight) hours as needed. For muscle spasms     FLAXSEED OIL PO  Take 10 mLs by mouth daily. Takes about 2 teaspoons=55ml daily     Krill Oil 500 MG Caps  Take 1 capsule by mouth daily.     multivitamin with minerals Tabs  Take 1 tablet by mouth daily.     naproxen sodium 220 MG tablet  Commonly known as:  ANAPROX  Take 220-440 mg by mouth 4 (four) times daily as needed. For pain     UNABLE TO FIND  Cinnamon 1/2 teaspoon daily           Follow-up Information   Follow up with Leslye Peer., MD On 11/10/2012. (2:15 pm  Keep scheduled appointment)    Contact information:   [address] [place] [address] [phone]                   Discharged Condition: good    Signed: Dorcas Carrow 10/25/2012, 9:29 AM

## 2012-10-25 NOTE — Telephone Encounter (Signed)
Spoke with pt and notified of recs per RB  He verbalized understanding and nothing further needed

## 2012-10-25 NOTE — Telephone Encounter (Signed)
Spoke with pt's spouse She states that while pt was hospitalized for pneumothorax, he was advised not to use his CPAP He is now home, and needs to know if he can use his machine tonight Please advise, thanks!

## 2012-10-25 NOTE — Telephone Encounter (Signed)
It's ok for him to restart it tonight. Tell him i'm glad he is home and feels better

## 2012-11-10 ENCOUNTER — Encounter: Payer: Self-pay | Admitting: Emergency Medicine

## 2012-11-10 ENCOUNTER — Ambulatory Visit (INDEPENDENT_AMBULATORY_CARE_PROVIDER_SITE_OTHER): Payer: BC Managed Care – PPO | Admitting: Emergency Medicine

## 2012-11-10 ENCOUNTER — Ambulatory Visit (INDEPENDENT_AMBULATORY_CARE_PROVIDER_SITE_OTHER)
Admission: RE | Admit: 2012-11-10 | Discharge: 2012-11-10 | Disposition: A | Discharge status: Home / Self Care | Payer: BC Managed Care – PPO | Source: Ambulatory Visit | Attending: Emergency Medicine | Admitting: Emergency Medicine

## 2012-11-10 VITALS — BP 140/84 | HR 69 | Temp 99.2°F | Ht 69.0 in | Wt 236.4 lb

## 2012-11-10 DIAGNOSIS — J95811 Postprocedural pneumothorax: Secondary | ICD-10-CM

## 2012-11-10 DIAGNOSIS — J984 Other disorders of lung: Secondary | ICD-10-CM

## 2012-11-10 NOTE — Progress Notes (Signed)
Quick Note:  ATC patient no answer LMOMTCB ______ 

## 2012-11-10 NOTE — Assessment & Plan Note (Signed)
Path on ENB negative - repeat CT scan 11/14 and then f/u to review

## 2012-11-10 NOTE — Assessment & Plan Note (Signed)
Appears to be clinically resolved.  - CXR today

## 2012-11-10 NOTE — Progress Notes (Signed)
Subjective:    Patient ID: James [name], male    DOB: Oct 06, 1950, 62 y.o.   MRN: [id number]  HPI 62 yo man, never smoker, hx of PPD + as a child but then non-reactive as an adult 2012, has been following a RUL GG opacity/nodule with Dr Sherene Sires dating back to 2011. He has serial CT scans that show interval increase in size, now 21x40mm. He presents to discuss possible bx by FOB + ENB.    ROV 11/10/12 -- follows up post-ENB c/b R PTX and chest tubes. The path was negative.  He has done relatively well since discharge, has not been SOB but has had a little R sided discomfort a couple times. No cough, no new complaints.   Review of Systems  Constitutional: Negative for fever and unexpected weight change.  HENT: Negative for ear pain, nosebleeds, congestion, sore throat, rhinorrhea, sneezing, trouble swallowing, dental problem, postnasal drip and sinus pressure.   Eyes: Negative for redness and itching.  Respiratory: Negative for cough, chest tightness, shortness of breath and wheezing.   Cardiovascular: Negative for palpitations and leg swelling.  Gastrointestinal: Negative for nausea and vomiting.  Genitourinary: Negative for dysuria.  Musculoskeletal: Negative for joint swelling.  Skin: Negative for rash.  Neurological: Negative for headaches.  Hematological: Does not bruise/bleed easily.  Psychiatric/Behavioral: Negative for dysphoric mood. The patient is not nervous/anxious.     Past Medical History  Diagnosis Date  . Wrist pain, left   . Depressive disorder, not elsewhere classified   . Fasting hyperglycemia   . Special screening for malignant neoplasm of prostate   . Routine general medical examination at a health care facility   . Spinal stenosis, unspecified region other than cervical   . GERD (gastroesophageal reflux disease)   . Colon polyps     10/27/2002, repeat letter 09/17/2007  . Other and unspecified hyperlipidemia   . MPN (myeloproliferative neoplasm)     1st detected  06/04/1998  . Hemoptysis     abnormal CT Chest 01/29/10 - ? new GG changes RUL > not viz on plain cxr 02/26/2010  . Positive PPD 1965    "non reactive in 2012" (10/19/2012)  . Obesity   . OSA on CPAP   . Arthritis     "knees, hips, back" (10/19/2012)  . Chronic lower back pain      Family History  Problem Relation Age of Onset  . Heart disease Father   . Heart disease Paternal Uncle   . Prostate cancer Neg Hx   . Colon cancer Neg Hx   . Hypertension Neg Hx   . Hyperlipidemia Neg Hx   . Diabetes Neg Hx      History   Social History  . Marital Status: Married    Spouse Name: N/A    Number of Children: 3  . Years of Education: N/A   Occupational History  . Retired, Multimedia programmer   . Retired, Real estate     Slow   Social History Main Topics  . Smoking status: Never Smoker   . Smokeless tobacco: Never Used  . Alcohol Use: 0.6 oz/week    1 Cans of beer per week  . Drug Use: No  . Sexually Active: No   Other Topics Concern  . Not on file   Social History Narrative   HSG, [employer]. Married '73.  2 sons - '74,   '76;  1 daughter -  '77  6 grandchildren.   Work - career  school teacher/coach now retired. ACP - not fully discussed.                     Allergies  Allergen Reactions  . Amoxicillin Itching     Outpatient Prescriptions Prior to Visit  Medication Sig Dispense Refill  . cyclobenzaprine (FLEXERIL) 5 MG tablet Take 5-10 mg by mouth every 8 (eight) hours as needed. For muscle spasms      . Flaxseed, Linseed, (FLAXSEED OIL PO) Take 10 mLs by mouth daily. Takes about 2 teaspoons=14ml daily      . Krill Oil 500 MG CAPS Take 1 capsule by mouth daily.      . Multiple Vitamin (MULTIVITAMIN WITH MINERALS) TABS Take 1 tablet by mouth daily.      . naproxen sodium (ANAPROX) 220 MG tablet Take 220-440 mg by mouth 4 (four) times daily as needed. For pain      . Sodium Bicarbonate-Citric Acid (ALKA-SELTZER HEARTBURN PO) Take 1 tablet by mouth at  bedtime as needed. For heartburn      . UNABLE TO FIND Cinnamon 1/2 teaspoon daily       No facility-administered medications prior to visit.        Objective:   Physical Exam Filed Vitals:   11/10/12 1419  BP: 140/84  Pulse: 69  Temp: 99.2 F (37.3 C)  TempSrc: Oral  Height: 5\' 9"  (1.753 m)  Weight: 236 lb 6.4 oz (107.23 kg)  SpO2: 96%   Gen: Pleasant, obese, in no distress,  normal affect  ENT: No lesions,  mouth clear,  oropharynx clear, no postnasal drip  Neck: No JVD, no TMG, no carotid bruits  Lungs: No use of accessory muscles,  clear without rales or rhonchi  Cardiovascular: RRR, heart sounds normal, no murmur or gallops, no peripheral edema  Musculoskeletal: No deformities, no cyanosis or clubbing  Neuro: alert, non focal  Skin: Warm, no lesions or rashes      Assessment & Plan:  PULMONARY NODULE Path on ENB negative - repeat CT scan 11/14 and then f/u to review   Pneumothorax after biopsy Appears to be clinically resolved.  - CXR today

## 2012-11-10 NOTE — Patient Instructions (Addendum)
We will perform a CXR today  We will perform a repeat CT scan in 11/14 Follow with Dr Delton Coombes in November after your scan to review or sooner if you have any problems.

## 2012-11-14 ENCOUNTER — Telehealth: Payer: Self-pay | Admitting: Emergency Medicine

## 2012-11-14 NOTE — Telephone Encounter (Signed)
Spoke with patient, made him aware of results as listed below per RB Patient verbalized understanding and nothing further needed at this time ------- Notes Recorded by Leslye Peer, MD on 11/10/2012 at 4:28 PM Please notify patient that his CXR is normal

## 2012-11-14 NOTE — Progress Notes (Signed)
Quick Note:  ATC patient no answer LMOMTCB ______ 

## 2012-11-14 NOTE — Telephone Encounter (Signed)
ATC patient no answer LMOMTCB 

## 2012-11-14 NOTE — Telephone Encounter (Signed)
Returning call can be reached at [phone].James [name]

## 2012-11-16 LAB — FUNGUS CULTURE W SMEAR: Fungal Smear: NONE SEEN

## 2012-12-02 LAB — AFB CULTURE WITH SMEAR (NOT AT ARMC): Acid Fast Smear: NONE SEEN

## 2013-02-08 ENCOUNTER — Encounter: Payer: Self-pay | Admitting: Gastroenterology

## 2013-03-16 ENCOUNTER — Other Ambulatory Visit: Payer: Self-pay

## 2013-03-21 ENCOUNTER — Ambulatory Visit (AMBULATORY_SURGERY_CENTER): Payer: Self-pay | Admitting: *Deleted

## 2013-03-21 VITALS — Ht 69.0 in | Wt 245.4 lb

## 2013-03-21 DIAGNOSIS — Z86010 Personal history of colonic polyps: Secondary | ICD-10-CM

## 2013-03-21 MED ORDER — NA SULFATE-K SULFATE-MG SULF 17.5-3.13-1.6 GM/177ML PO SOLN: 1.0000 | Freq: Once | ORAL | Status: DC

## 2013-03-21 NOTE — Progress Notes (Signed)
No allergies to eggs or soy. No problems with anesthesia.  

## 2013-03-24 ENCOUNTER — Encounter: Payer: Self-pay | Admitting: Gastroenterology

## 2013-04-11 ENCOUNTER — Encounter: Payer: Self-pay | Admitting: Gastroenterology

## 2013-04-11 ENCOUNTER — Ambulatory Visit (AMBULATORY_SURGERY_CENTER): Payer: BC Managed Care – PPO | Admitting: Gastroenterology

## 2013-04-11 VITALS — BP 131/85 | HR 59 | Temp 97.2°F | Resp 18 | Ht 69.0 in | Wt 245.0 lb

## 2013-04-11 DIAGNOSIS — D126 Benign neoplasm of colon, unspecified: Secondary | ICD-10-CM

## 2013-04-11 DIAGNOSIS — Z860100 Personal history of colon polyps, unspecified: Secondary | ICD-10-CM

## 2013-04-11 DIAGNOSIS — Z1211 Encounter for screening for malignant neoplasm of colon: Secondary | ICD-10-CM

## 2013-04-11 DIAGNOSIS — K5730 Diverticulosis of large intestine without perforation or abscess without bleeding: Secondary | ICD-10-CM

## 2013-04-11 LAB — HM COLONOSCOPY

## 2013-04-11 MED ORDER — SODIUM CHLORIDE 0.9 % IV SOLN: 500.0000 mL | INTRAVENOUS | Status: DC

## 2013-04-11 NOTE — Progress Notes (Signed)
Lidocaine-40mg IV prior to Propofol InductionPropofol given over incremental dosages 

## 2013-04-11 NOTE — Patient Instructions (Signed)
YOU HAD AN ENDOSCOPIC PROCEDURE TODAY AT THE [facility]: Refer to the procedure report that was given to you for any specific questions about what was found during the examination.  If the procedure report does not answer your questions, please call your gastroenterologist to clarify.  If you requested that your care partner not be given the details of your procedure findings, then the procedure report has been included in a sealed envelope for you to review at your convenience later.  YOU SHOULD EXPECT: Some feelings of bloating in the abdomen. Passage of more gas than usual.  Walking can help get rid of the air that was put into your GI tract during the procedure and reduce the bloating. If you had a lower endoscopy (such as a colonoscopy or flexible sigmoidoscopy) you may notice spotting of blood in your stool or on the toilet paper. If you underwent a bowel prep for your procedure, then you may not have a normal bowel movement for a few days.  DIET: Your first meal following the procedure should be a light meal and then it is ok to progress to your normal diet.  A half-sandwich or bowl of soup is an example of a good first meal.  Heavy or fried foods are harder to digest and may make you feel nauseous or bloated.  Likewise meals heavy in dairy and vegetables can cause extra gas to form and this can also increase the bloating.  Drink plenty of fluids but you should avoid alcoholic beverages for 24 hours.  ACTIVITY: Your care partner should take you home directly after the procedure.  You should plan to take it easy, moving slowly for the rest of the day.  You can resume normal activity the day after the procedure however you should NOT DRIVE or use heavy machinery for 24 hours (because of the sedation medicines used during the test).    SYMPTOMS TO REPORT IMMEDIATELY: A gastroenterologist can be reached at any hour.  During normal business hours, 8:30 AM to 5:00 PM Monday through Friday,  call [phone].  After hours and on weekends, please call the GI answering service at [phone] who will take a message and have the physician on call contact you.   Following lower endoscopy (colonoscopy or flexible sigmoidoscopy):  Excessive amounts of blood in the stool  Significant tenderness or worsening of abdominal pains  Swelling of the abdomen that is new, acute  Fever of 100F or higher  FOLLOW UP: If any biopsies were taken you will be contacted by phone or by letter within the next 1-3 weeks.  Call your gastroenterologist if you have not heard about the biopsies in 3 weeks.  Our staff will call the home number listed on your records the next business day following your procedure to check on you and address any questions or concerns that you may have at that time regarding the information given to you following your procedure. This is a courtesy call and so if there is no answer at the home number and we have not heard from you through the emergency physician on call, we will assume that you have returned to your regular daily activities without incident.  SIGNATURES/CONFIDENTIALITY: You and/or your care partner have signed paperwork which will be entered into your electronic medical record.  These signatures attest to the fact that that the information above on your After Visit Summary has been reviewed and is understood.  Full responsibility of the confidentiality of this   discharge information lies with you and/or your care-partner.  Recommendations See procedure report  

## 2013-04-11 NOTE — Progress Notes (Signed)
Called to room to assist during endoscopic procedure.  Patient ID and intended procedure confirmed with present staff. Received instructions for my participation in the procedure from the performing physician.  

## 2013-04-11 NOTE — Op Note (Signed)
[facility] [address] [place], [address]   COLONOSCOPY PROCEDURE REPORT  PATIENT: James, [name]  MR#: [id number] BIRTHDATE: 1950/11/12 , 62  yrs. old GENDER: Male ENDOSCOPIST: Louis Meckel, MD REFERRED BY: PROCEDURE DATE:  04/11/2013 PROCEDURE:   Colonoscopy with snare polypectomy First Screening Colonoscopy - Avg.  risk and is 50 yrs.  old or older - No.  Prior Negative Screening - Now for repeat screening. 10 or more years since last screening  History of Adenoma - Now for follow-up colonoscopy & has been > or = to 3 yrs.  N/A  Polyps Removed Today? Yes. ASA CLASS:   Class II INDICATIONS:Average risk patient for colon cancer. MEDICATIONS: MAC sedation, administered by CRNA and propofol (Diprivan) 250mg  IV  DESCRIPTION OF PROCEDURE:   After the risks benefits and alternatives of the procedure were thoroughly explained, informed consent was obtained.  A digital rectal exam revealed no abnormalities of the rectum.   The LB [id number]  endoscope was introduced through the anus and advanced to the cecum, which was identified by both the appendix and ileocecal valve.I was unable to retroflex in the cecum.   No adverse events experienced. The quality of the prep was excellent using Suprep  The instrument was then slowly withdrawn as the colon was fully examined.      COLON FINDINGS: A sessile polyp measuring 3 mm in size was found at the hepatic flexure.  A polypectomy was performed with a cold snare.  The resection was complete and the polyp tissue was completely retrieved.   Mild diverticulosis was noted in the sigmoid colon.   The colon was otherwise normal.  There was no diverticulosis, inflammation, polyps or cancers unless previously stated.  Retroflexed views revealed no abnormalities. The time to cecum=2 minutes 34 seconds.  Withdrawal time=11 minutes 0 seconds. The scope was withdrawn and the procedure completed. COMPLICATIONS: There  were no complications.  ENDOSCOPIC IMPRESSION: 1.   Sessile polyp measuring 3 mm in size was found at the hepatic flexure; polypectomy was performed with a cold snare 2.   Mild diverticulosis was noted in the sigmoid colon 3.   The colon was otherwise normal  RECOMMENDATIONS: If the polyp(s) removed today are proven to be adenomatous (pre-cancerous) polyps, you will need a repeat colonoscopy in 5 years.  Otherwise you should continue to follow colorectal cancer screening guidelines for "routine risk" patients with colonoscopy in 10 years.  You will receive a letter within 1-2 weeks with the results of your biopsy as well as final recommendations.  Please call my office if you have not received a letter after 3 weeks.   eSigned:  Louis Meckel, MD 04/11/2013 8:58 AM   cc: Jacques Navy, MD and Bernadette Hoit, MD   PATIENT NAME:  James, [name] MR#: [id number]

## 2013-04-11 NOTE — Progress Notes (Signed)
Patient did not have preoperative order for IV antibiotic SSI prophylaxis. (G8918)  Patient did not experience any of the following events: a burn prior to discharge; a fall within the facility; wrong site/side/patient/procedure/implant event; or a hospital transfer or hospital admission upon discharge from the facility. (G8907)  

## 2013-04-12 ENCOUNTER — Telehealth: Payer: Self-pay | Admitting: *Deleted

## 2013-04-12 NOTE — Telephone Encounter (Signed)
  Follow up Call-  Call back number 04/11/2013  Post procedure Call Back phone  # [phone]  Permission to leave phone message Yes     Patient questions:  Do you have a fever, pain , or abdominal swelling? no Pain Score  0 *  Have you tolerated food without any problems? yes  Have you been able to return to your normal activities? yes  Do you have any questions about your discharge instructions: Diet   no Medications  no Follow up visit  no  Do you have questions or concerns about your Care? no  Actions: * If pain score is 4 or above: No action needed, pain <4.

## 2013-04-18 ENCOUNTER — Encounter: Payer: Self-pay | Admitting: Gastroenterology

## 2013-04-26 ENCOUNTER — Ambulatory Visit (INDEPENDENT_AMBULATORY_CARE_PROVIDER_SITE_OTHER): Payer: BC Managed Care – PPO | Admitting: Internal Medicine

## 2013-04-26 ENCOUNTER — Encounter: Payer: Self-pay | Admitting: Internal Medicine

## 2013-04-26 VITALS — BP 148/80 | HR 68 | Temp 100.0°F | Wt 239.0 lb

## 2013-04-26 DIAGNOSIS — R071 Chest pain on breathing: Secondary | ICD-10-CM

## 2013-04-26 DIAGNOSIS — J069 Acute upper respiratory infection, unspecified: Secondary | ICD-10-CM

## 2013-04-26 DIAGNOSIS — R0789 Other chest pain: Secondary | ICD-10-CM

## 2013-04-26 DIAGNOSIS — R232 Flushing: Secondary | ICD-10-CM

## 2013-04-26 MED ORDER — SULFAMETHOXAZOLE-TMP DS 800-160 MG PO TABS: 1.0000 | ORAL_TABLET | Freq: Two times a day (BID) | ORAL | Status: DC

## 2013-04-26 NOTE — Progress Notes (Signed)
Pre visit review using our clinic review tool, if applicable. No additional management support is needed unless otherwise documented below in the visit note. 

## 2013-04-26 NOTE — Patient Instructions (Signed)
Happy Holidays to you.  You have an upper respiratory infection - lungs are clear. Plan Septra DS (antibiotic) twice a day for 7 days  Promethazine with codiene cough syrup  Hydrate  Tylenol as needed for fever and aches.  Lymph node(s)  - the node in the left supraclavicular region is probably chronic and at this point of no significance.  There were no enlarged lymph nodes in the axilla (armpit).  Chest wall pain - the worsening of the pain is most likely due to the cough. There should be no pain associated with the biopsy or collapsed lung.  Facial flushing - a long list of possibilities. Pay attention to heart rate and how you feel when this happens.  I will be here, available through March. Do not hesitate to call or MyChart me.

## 2013-04-26 NOTE — Progress Notes (Signed)
Subjective:    Patient ID: James [name], male    DOB: 07/14/1950, 62 y.o.   MRN: [id number]  HPI Presents with fever and URI symptoms since Saturday, he had chest pain and this started after he began coughing. He also had pain in the axilla bilaterally. He did have several skin lesions in the left axilla.  Past Medical History  Diagnosis Date  . Wrist pain, left   . Depressive disorder, not elsewhere classified   . Fasting hyperglycemia   . Special screening for malignant neoplasm of prostate   . Routine general medical examination at a health care facility   . Spinal stenosis, unspecified region other than cervical   . GERD (gastroesophageal reflux disease)   . Colon polyps     10/27/2002, repeat letter 09/17/2007  . Other and unspecified hyperlipidemia   . MPN (myeloproliferative neoplasm)     1st detected 06/04/1998  . Hemoptysis     abnormal CT Chest 01/29/10 - ? new GG changes RUL > not viz on plain cxr 02/26/2010  . Positive PPD 1965    "non reactive in 2012" (10/19/2012)  . Obesity   . OSA on CPAP   . Arthritis     "knees, hips, back" (10/19/2012)  . Chronic lower back pain    Past Surgical History  Procedure Laterality Date  . Tonsillectomy  1950's  . Anterior cruciate ligament repair Left 1967  . Wrist reconstruction  12/2009    'proximal row carpectomy" [name]  . Cardiac catheterization  2000  . Colonoscopy w/ polypectomy    . Flexible bronchoscopy  10/19/2012    Flexible video fiberoptic bronchoscopy with electromagnetic navigation and biopsies.  . Chest tube insertion Right 10/19/2012    post bronch  . Video bronchoscopy with endobronchial navigation N/A 10/19/2012    Procedure: VIDEO BRONCHOSCOPY WITH ENDOBRONCHIAL NAVIGATION;  Surgeon: Leslye Peer, MD;  Location: [facility] OR;  Service: Thoracic;  Laterality: N/A;   Family History  Problem Relation Age of Onset  . Heart disease Father   . Heart disease Paternal Uncle   . Prostate cancer Neg Hx   . Colon cancer Neg  Hx   . Hypertension Neg Hx   . Hyperlipidemia Neg Hx   . Diabetes Neg Hx    History   Social History  . Marital Status: Married    Spouse Name: N/A    Number of Children: 3  . Years of Education: N/A   Occupational History  . Retired, Multimedia programmer   . Retired, Real estate     Slow   Social History Main Topics  . Smoking status: Never Smoker   . Smokeless tobacco: Never Used  . Alcohol Use: 0.6 oz/week    1 Cans of beer per week  . Drug Use: No  . Sexual Activity: No   Other Topics Concern  . Not on file   Social History Narrative   HSG, [employer]. Married '73.  2 sons - '74,   '76;  1 daughter -  '77  6 grandchildren.   Work - Teacher, music now retired. ACP - not fully discussed.                     Current Outpatient Prescriptions on File Prior to Visit  Medication Sig Dispense Refill  . cyclobenzaprine (FLEXERIL) 5 MG tablet Take 5-10 mg by mouth every 8 (eight) hours as needed. For muscle spasms      .  Flaxseed, Linseed, (FLAXSEED OIL PO) Take 10 mLs by mouth daily. Takes about 2 teaspoons=57ml daily      . Krill Oil 500 MG CAPS Take 1 capsule by mouth daily.      . Multiple Vitamin (MULTIVITAMIN WITH MINERALS) TABS Take 1 tablet by mouth daily.      . naproxen sodium (ANAPROX) 220 MG tablet Take 220-440 mg by mouth 4 (four) times daily as needed. For pain      . Sodium Bicarbonate-Citric Acid (ALKA-SELTZER HEARTBURN PO) Take 1 tablet by mouth at bedtime as needed. For heartburn      . UNABLE TO FIND Cinnamon 1/2 teaspoon daily       No current facility-administered medications on file prior to visit.      Review of Systems System review is negative for any constitutional, cardiac, pulmonary, GI or neuro symptoms or complaints other than as described in the HPI.     Objective:   Physical Exam Filed Vitals:   04/26/13 1132  BP: 148/80  Pulse: 68  Temp: 100 F (37.8 C)   Wt Readings from Last 3 Encounters:    04/26/13 239 lb (108.41 kg)  04/11/13 245 lb (111.131 kg)  03/21/13 245 lb 6.4 oz (111.313 kg)   Gen'l - overweight, mildly plethoric man in no distress HEENT - minimal sinus tenderness, throat clear Nodes - very small node right supraclavicular area, small (<1cm) node left anterior chain vs supraclavicular area-firm mobile, non-tender, no axillary adenopathy Cor 2+ radial, RRR Pulm - normal respirations, no rales or wheezes Chest - mild chest wall tenderness Neuro - A&O x 3.        Assessment & Plan:  1. You have an upper respiratory infection - lungs are clear. Plan Septra DS (antibiotic) twice a day for 7 days  Promethazine with codiene cough syrup  Hydrate  Tylenol as needed for fever and aches.  2. Lymph node(s)  - the node in the left supraclavicular region is probably chronic and at this point of no significance.  There were no enlarged lymph nodes in the axilla (armpit).  3. Chest wall pain - the worsening of the pain is most likely due to the cough. There should be no pain associated with the biopsy or collapsed lung.  4. Facial flushing - a long list of possibilities. Pay attention to heart rate and how you feel when this happens.

## 2013-04-28 ENCOUNTER — Telehealth: Payer: Self-pay | Admitting: Internal Medicine

## 2013-04-28 NOTE — Telephone Encounter (Signed)
Patient Information:  Caller Name: Para March  Phone: [phone]  Patient: James [name], James [name]  Gender: Male  DOB: 05-Aug-1950  Age: 62 Years  PCP: Illene Regulus (Adults only)  Office Follow Up:  Does the office need to follow up with this patient?: No  Instructions For The Office: N/A   Symptoms  Reason For Call & Symptoms: Pt saw Dr. Debby Bud 04/26/13 with a severe cough and it is not moving up into his head.  He cannot lay down.  He is on Septra.  Can he take something for the congestion with the Septra  Reviewed Health History In EMR: Yes  Reviewed Medications In EMR: Yes  Reviewed Allergies In EMR: Yes  Reviewed Surgeries / Procedures: Yes  Date of Onset of Symptoms: 04/23/2013  Treatments Tried: He is on his Septra and hydrotussin cough syrup and taking Tylenol  Treatments Tried Worked: No  Guideline(s) Used:  Sinus Pain and Congestion  Disposition Per Guideline:   Go to Office Now  Reason For Disposition Reached:   Redness or swelling on the cheek, forehead, or around the eye  Advice Given:  Pt does not want to go to the office, he just want tot know what decongestant he can take with the meds he is on.  Also recommended a Neti Pot.  Patient Will Follow Care Advice: Yes

## 2013-07-11 ENCOUNTER — Other Ambulatory Visit (INDEPENDENT_AMBULATORY_CARE_PROVIDER_SITE_OTHER): Payer: BC Managed Care – PPO

## 2013-07-11 ENCOUNTER — Encounter: Payer: Self-pay | Admitting: Internal Medicine

## 2013-07-11 ENCOUNTER — Ambulatory Visit (INDEPENDENT_AMBULATORY_CARE_PROVIDER_SITE_OTHER): Payer: BC Managed Care – PPO | Admitting: Internal Medicine

## 2013-07-11 VITALS — BP 140/90 | HR 75 | Temp 98.7°F | Wt 240.2 lb

## 2013-07-11 DIAGNOSIS — J309 Allergic rhinitis, unspecified: Secondary | ICD-10-CM

## 2013-07-11 DIAGNOSIS — E785 Hyperlipidemia, unspecified: Secondary | ICD-10-CM

## 2013-07-11 DIAGNOSIS — R7309 Other abnormal glucose: Secondary | ICD-10-CM

## 2013-07-11 DIAGNOSIS — N138 Other obstructive and reflux uropathy: Secondary | ICD-10-CM

## 2013-07-11 DIAGNOSIS — E291 Testicular hypofunction: Secondary | ICD-10-CM

## 2013-07-11 DIAGNOSIS — N401 Enlarged prostate with lower urinary tract symptoms: Secondary | ICD-10-CM

## 2013-07-11 DIAGNOSIS — T7840XA Allergy, unspecified, initial encounter: Secondary | ICD-10-CM

## 2013-07-11 LAB — TESTOSTERONE: Testosterone: 168.26 ng/dL — ABNORMAL LOW (ref 350.00–890.00)

## 2013-07-11 LAB — BASIC METABOLIC PANEL
BUN: 16 mg/dL (ref 6–23)
CO2: 27 mEq/L (ref 19–32)
Calcium: 9.4 mg/dL (ref 8.4–10.5)
Chloride: 105 mEq/L (ref 96–112)
Creatinine, Ser: 1.2 mg/dL (ref 0.4–1.5)
GFR: 64.14 mL/min (ref >60.00)
Glucose, Bld: 118 mg/dL — ABNORMAL HIGH (ref 70–99)
Potassium: 4.8 mEq/L (ref 3.5–5.1)
Sodium: 137 mEq/L (ref 135–145)

## 2013-07-11 LAB — LIPID PANEL
Cholesterol: 272 mg/dL — ABNORMAL HIGH (ref 0–200)
HDL: 37.1 mg/dL — ABNORMAL LOW (ref >39.00)
LDL Cholesterol: 205 mg/dL — ABNORMAL HIGH (ref 0–99)
Total CHOL/HDL Ratio: 7
Triglycerides: 149 mg/dL (ref 0.0–149.0)
VLDL: 29.8 mg/dL (ref 0.0–40.0)

## 2013-07-11 LAB — HEMOGLOBIN A1C: Hgb A1c MFr Bld: 6.2 % (ref 4.6–6.5)

## 2013-07-11 MED ORDER — TAMSULOSIN HCL 0.4 MG PO CAPS
0.4000 mg | ORAL_CAPSULE | Freq: Every day | ORAL | Status: DC
Start: 2013-07-11 — End: 2014-03-12

## 2013-07-11 NOTE — Patient Instructions (Signed)
1. Events from last Saturday after eating out - it sounds like you had an allergic reaction to something that lead to Headache, dizziness and facial swelling that took 24-48 hours to clear. At this point no treatment that is specific  2. Rhinitis - either allergic or vaso-motor rhinitis which is the immune system going into defensive posture leading to sinus drainage, congestion, watery eyes with matting in the AM. Plan Take a non-sedating antihistamine, e.g. claritin 10 mg once a day  For persistent symptoms - let me know and we can try a nasal inhalational steroid spray.  3. BPH with LUTS - an enlarged prostate as a baseline with a sudden worsening of symptoms most likely due to a viral inflammation with no signs of a bacterial infection. Plan An alpha agonist - which will lead to shrinking of the prostate and relief of symptoms. Thus the medication can help make the diagnosis.  4. ED - no idea why this has suddenly gotten worse. Plan All of the above  Lab - will check a testosterone level.

## 2013-07-11 NOTE — Progress Notes (Signed)
Pre visit review using our clinic review tool, if applicable. No additional management support is needed unless otherwise documented below in the visit note. 

## 2013-07-11 NOTE — Progress Notes (Signed)
Subjective:    Patient ID: James [name], male    DOB: 1951-05-05, 63 y.o.   MRN: [id number]  HPI Not a good winter - not feeling well. Saturday he went out to eat - over ate and felt bad: HA, Dizzy, felt crummy, lip started to swell. No new foods or any allergen.  Sunday continued to feel bad.  Swelling continued - involving face and lips, per wife face looked swollen.  Monday felt really bad. Blood pressure was very high with systolic BP 397.He has also had dysuria in a setting of having urinary urgency and frequency for the past several months.  He denies fevers, chills, respiratory symptoms, i.e. Wheezing or SOB, pain in the low back or lower abdomen.  He does report a month of morning sinus drainage, matted eyes, not feeling well. In January he had a loss of taste which has recovered. Using otc moisturing drops.   Past Medical History  Diagnosis Date  . Wrist pain, left   . Depressive disorder, not elsewhere classified   . Fasting hyperglycemia   . Special screening for malignant neoplasm of prostate   . Routine general medical examination at a health care facility   . Spinal stenosis, unspecified region other than cervical   . GERD (gastroesophageal reflux disease)   . Colon polyps     10/27/2002, repeat letter 09/17/2007  . Other and unspecified hyperlipidemia   . MPN (myeloproliferative neoplasm)     1st detected 06/04/1998  . Hemoptysis     abnormal CT Chest 01/29/10 - ? new GG changes RUL > not viz on plain cxr 02/26/2010  . Positive PPD 1965    "non reactive in 2012" (10/19/2012)  . Obesity   . OSA on CPAP   . Arthritis     "knees, hips, back" (10/19/2012)  . Chronic lower back pain    Past Surgical History  Procedure Laterality Date  . Tonsillectomy  1950's  . Anterior cruciate ligament repair Left 1967  . Wrist reconstruction  12/2009    'proximal row carpectomy" [name]  . Cardiac catheterization  2000  . Colonoscopy w/ polypectomy    . Flexible bronchoscopy   10/19/2012    Flexible video fiberoptic bronchoscopy with electromagnetic navigation and biopsies.  . Chest tube insertion Right 10/19/2012    post bronch  . Video bronchoscopy with endobronchial navigation N/A 10/19/2012    Procedure: VIDEO BRONCHOSCOPY WITH ENDOBRONCHIAL NAVIGATION;  Surgeon: [name], MD;  Location: [facility] OR;  Service: Thoracic;  Laterality: N/A;   Family History  Problem Relation Age of Onset  . Heart disease Father   . Heart disease Paternal Uncle   . Prostate cancer Neg Hx   . Colon cancer Neg Hx   . Hypertension Neg Hx   . Hyperlipidemia Neg Hx   . Diabetes Neg Hx    History   Social History  . Marital Status: Married    Spouse Name: N/A    Number of Children: 3  . Years of Education: N/A   Occupational History  . Retired, Financial risk analyst   . Retired, Real estate     Slow   Social History Main Topics  . Smoking status: Never Smoker   . Smokeless tobacco: Never Used  . Alcohol Use: 0.6 oz/week    1 Cans of beer per week  . Drug Use: No  . Sexual Activity: No   Other Topics Concern  . Not on file   Social History Narrative  HSG, [employer]. Married '73.  2 sons - '74,   '76;  1 daughter -  '77  6 grandchildren.   Work - IT consultant now retired. ACP - not fully discussed.                     Current Outpatient Prescriptions on File Prior to Visit  Medication Sig Dispense Refill  . cyclobenzaprine (FLEXERIL) 5 MG tablet Take 5-10 mg by mouth every 8 (eight) hours as needed. For muscle spasms      . Flaxseed, Linseed, (FLAXSEED OIL PO) Take 10 mLs by mouth daily. Takes about 2 teaspoons=13ml daily      . Krill Oil 500 MG CAPS Take 1 capsule by mouth daily.      . Multiple Vitamin (MULTIVITAMIN WITH MINERALS) TABS Take 1 tablet by mouth daily.      . naproxen sodium (ANAPROX) 220 MG tablet Take 220-440 mg by mouth 4 (four) times daily as needed. For pain      . Sodium Bicarbonate-Citric Acid (ALKA-SELTZER  HEARTBURN PO) Take 1 tablet by mouth at bedtime as needed. For heartburn      . sulfamethoxazole-trimethoprim (BACTRIM DS) 800-160 MG per tablet Take 1 tablet by mouth 2 (two) times daily.  14 tablet  0  . UNABLE TO FIND Cinnamon 1/2 teaspoon daily       No current facility-administered medications on file prior to visit.      Review of Systems System review is negative for any constitutional, cardiac, pulmonary, GI or neuro symptoms or complaints other than as described in the HPI.     Objective:   Physical Exam Filed Vitals:   07/11/13 0817  BP: 140/90  Pulse: 75  Temp: 98.7 F (37.1 C)   Wt Readings from Last 3 Encounters:  07/11/13 240 lb 3.2 oz (108.954 kg)  04/26/13 239 lb (108.41 kg)  04/11/13 245 lb (111.131 kg)   Gen'l- overweight man in no distress HEENT- face is full but does not appear swollen; C&S clear, PERRLA, no thickening of the tongue Cor 2+ radial, RRR PUlm - normal respirations, no wheezing Abd - obese Neuro - A&O x 3, speech is clear, cognition and recall normal.      Assessment & Plan:  Acute allergic reaction - symptoms of sudden on-set of HA, dizziness, angioedema immediately after a meal with symptoms that persisted for 36 hrs without other symptoms of infection suggests an allergic reaction of some type. At this time symptoms, including angioedema have resolved.  Plan Caution with new foods  For any repeat episode use of Benadryl and if any signs of difficulty with swallow or breathing ED eval

## 2013-07-12 DIAGNOSIS — N138 Other obstructive and reflux uropathy: Secondary | ICD-10-CM | POA: Insufficient documentation

## 2013-07-12 DIAGNOSIS — N401 Enlarged prostate with lower urinary tract symptoms: Secondary | ICD-10-CM | POA: Insufficient documentation

## 2013-07-12 DIAGNOSIS — J309 Allergic rhinitis, unspecified: Secondary | ICD-10-CM | POA: Insufficient documentation

## 2013-07-12 NOTE — Assessment & Plan Note (Signed)
Prior h/o elevated serum glucose. Patient is concerned that what may have been an allergic reaction is due to abnormal glucose levels.  Plan A1C with recommendations to follow.

## 2013-07-12 NOTE — Assessment & Plan Note (Signed)
H/o gradually progressive signs of prostatism over the past year(s) now with several weeks of marked urgency with discomfort, slowed stream. No signs or symptoms of acute bacterial infection. Symptoms are suggestive of chronic BPH w/ LUTS exacerbated by a potential viral infection with more acute symtpoms.  PLan Trial of tamsulosin

## 2013-07-12 NOTE — Assessment & Plan Note (Signed)
H/o chronically low testosterone. He does report complete impotence of several months duration.  Plan Testosterone level with recommendations to follow.

## 2013-07-12 NOTE — Assessment & Plan Note (Signed)
Mr. Heo stopped "statin" medication due to concern it was contributing to ED.  Plan F/u lipid panel with recommendations to follow.

## 2014-01-18 ENCOUNTER — Telehealth: Payer: Self-pay | Admitting: Internal Medicine

## 2014-01-18 NOTE — Telephone Encounter (Signed)
Pt and his spouse Jeanett Schlein [phone])  called stated that Dr. Linda Hedges told them that they would be assign to another doctor here once Dr. Linda Hedges retired but they never heard anything. Pt and his spouse was wondering if Dr. Ronnald Ramp would take them on as a transfer from [place]. Please advise.

## 2014-01-19 NOTE — Telephone Encounter (Signed)
Yes to both.

## 2014-01-19 NOTE — Telephone Encounter (Signed)
appt is set for bother pt.

## 2014-02-23 ENCOUNTER — Other Ambulatory Visit: Payer: Self-pay

## 2014-03-12 ENCOUNTER — Ambulatory Visit (INDEPENDENT_AMBULATORY_CARE_PROVIDER_SITE_OTHER): Payer: BC Managed Care – PPO | Admitting: Internal Medicine

## 2014-03-12 ENCOUNTER — Other Ambulatory Visit (INDEPENDENT_AMBULATORY_CARE_PROVIDER_SITE_OTHER): Payer: BC Managed Care – PPO

## 2014-03-12 ENCOUNTER — Encounter: Payer: Self-pay | Admitting: Internal Medicine

## 2014-03-12 VITALS — BP 124/70 | HR 66 | Temp 98.6°F | Resp 16 | Ht 69.0 in | Wt 230.0 lb

## 2014-03-12 DIAGNOSIS — M48061 Spinal stenosis, lumbar region without neurogenic claudication: Secondary | ICD-10-CM

## 2014-03-12 DIAGNOSIS — Z0000 Encounter for general adult medical examination without abnormal findings: Secondary | ICD-10-CM

## 2014-03-12 DIAGNOSIS — R739 Hyperglycemia, unspecified: Secondary | ICD-10-CM

## 2014-03-12 DIAGNOSIS — J301 Allergic rhinitis due to pollen: Secondary | ICD-10-CM

## 2014-03-12 DIAGNOSIS — K219 Gastro-esophageal reflux disease without esophagitis: Secondary | ICD-10-CM

## 2014-03-12 DIAGNOSIS — E669 Obesity, unspecified: Secondary | ICD-10-CM

## 2014-03-12 DIAGNOSIS — E785 Hyperlipidemia, unspecified: Secondary | ICD-10-CM

## 2014-03-12 DIAGNOSIS — N138 Other obstructive and reflux uropathy: Secondary | ICD-10-CM

## 2014-03-12 DIAGNOSIS — N401 Enlarged prostate with lower urinary tract symptoms: Secondary | ICD-10-CM

## 2014-03-12 LAB — CBC WITH DIFFERENTIAL/PLATELET
Basophils Absolute: 0 10*3/uL (ref 0.0–0.1)
Basophils Relative: 0.6 % (ref 0.0–3.0)
Eosinophils Absolute: 0.1 10*3/uL (ref 0.0–0.7)
Eosinophils Relative: 1.9 % (ref 0.0–5.0)
HCT: 47.9 % (ref 39.0–52.0)
Hemoglobin: 16 g/dL (ref 13.0–17.0)
Lymphocytes Relative: 22.3 % (ref 12.0–46.0)
Lymphs Abs: 1.5 10*3/uL (ref 0.7–4.0)
MCHC: 33.4 g/dL (ref 30.0–36.0)
MCV: 92 fl (ref 78.0–100.0)
Monocytes Absolute: 0.5 10*3/uL (ref 0.1–1.0)
Monocytes Relative: 8 % (ref 3.0–12.0)
Neutro Abs: 4.5 10*3/uL (ref 1.4–7.7)
Neutrophils Relative %: 67.2 % (ref 43.0–77.0)
Platelets: 172 10*3/uL (ref 150.0–400.0)
RBC: 5.21 Mil/uL (ref 4.22–5.81)
RDW: 14.2 % (ref 11.5–15.5)
WBC: 6.8 10*3/uL (ref 4.0–10.5)

## 2014-03-12 LAB — PSA: PSA: 1.58 ng/mL (ref 0.10–4.00)

## 2014-03-12 LAB — COMPREHENSIVE METABOLIC PANEL
ALT: 28 U/L (ref 0–53)
AST: 20 U/L (ref 0–37)
Albumin: 3.5 g/dL (ref 3.5–5.2)
Alkaline Phosphatase: 58 U/L (ref 39–117)
BUN: 19 mg/dL (ref 6–23)
CO2: 23 mEq/L (ref 19–32)
Calcium: 9.2 mg/dL (ref 8.4–10.5)
Chloride: 107 mEq/L (ref 96–112)
Creatinine, Ser: 1.1 mg/dL (ref 0.4–1.5)
GFR: 69.26 mL/min (ref >60.00)
Glucose, Bld: 118 mg/dL — ABNORMAL HIGH (ref 70–99)
Potassium: 4.5 mEq/L (ref 3.5–5.1)
Sodium: 139 mEq/L (ref 135–145)
Total Bilirubin: 0.5 mg/dL (ref 0.2–1.2)
Total Protein: 6.7 g/dL (ref 6.0–8.3)

## 2014-03-12 LAB — HEMOGLOBIN A1C: Hgb A1c MFr Bld: 6.1 % (ref 4.6–6.5)

## 2014-03-12 LAB — LIPID PANEL
Cholesterol: 239 mg/dL — ABNORMAL HIGH (ref 0–200)
HDL: 36.8 mg/dL — ABNORMAL LOW (ref >39.00)
LDL Cholesterol: 172 mg/dL — ABNORMAL HIGH (ref 0–99)
NonHDL: 202.2
Total CHOL/HDL Ratio: 6
Triglycerides: 152 mg/dL — ABNORMAL HIGH (ref 0.0–149.0)
VLDL: 30.4 mg/dL (ref 0.0–40.0)

## 2014-03-12 LAB — FECAL OCCULT BLOOD, GUAIAC: Fecal Occult Blood: NEGATIVE

## 2014-03-12 LAB — TSH: TSH: 1.82 u[IU]/mL (ref 0.35–4.50)

## 2014-03-12 NOTE — Assessment & Plan Note (Signed)
His LDL is a little high and he has risk factors for CAD and CVA but he is not willing to take a statin at this time

## 2014-03-12 NOTE — Progress Notes (Signed)
   Subjective:    Patient ID: James [name], male    DOB: 08/02/1950, 63 y.o.   MRN: [id number]  Hyperlipidemia This is a chronic problem. The current episode started more than 1 year ago. The problem is uncontrolled. Recent lipid tests were reviewed and are variable. Exacerbating diseases include obesity. He has no history of chronic renal disease, diabetes, hypothyroidism, liver disease or nephrotic syndrome. Factors aggravating his hyperlipidemia include fatty foods. Pertinent negatives include no chest pain, focal sensory loss, focal weakness, leg pain, myalgias or shortness of breath. He is currently on no antihyperlipidemic treatment. The current treatment provides no improvement of lipids. Compliance problems include adherence to diet and adherence to exercise.       Review of Systems  Respiratory: Negative for shortness of breath.   Cardiovascular: Negative for chest pain.  Musculoskeletal: Negative for myalgias.  Neurological: Negative for focal weakness.  All other systems reviewed and are negative.      Objective:   Physical Exam  Constitutional: He appears well-developed and well-nourished. No distress.  HENT:  Head: Normocephalic and atraumatic.  Mouth/Throat: Oropharynx is clear and moist. No oropharyngeal exudate.  Eyes: Conjunctivae are normal. Right eye exhibits no discharge. Left eye exhibits no discharge. No scleral icterus.  Neck: Normal range of motion. Neck supple. No JVD present. No tracheal deviation present. No thyromegaly present.  Cardiovascular: Normal rate, regular rhythm, normal heart sounds and intact distal pulses.  Exam reveals no gallop and no friction rub.   No murmur heard. Pulmonary/Chest: Effort normal and breath sounds normal. No stridor. No respiratory distress. He has no wheezes. He has no rales. He exhibits no tenderness.  Abdominal: Soft. Bowel sounds are normal. He exhibits no distension and no mass. There is no tenderness. There is no rebound  and no guarding. Hernia confirmed negative in the right inguinal area and confirmed negative in the left inguinal area.  Genitourinary: Rectum normal, prostate normal, testes normal and penis normal. Rectal exam shows no external hemorrhoid, no internal hemorrhoid, no fissure, no mass, no tenderness and anal tone normal. Guaiac negative stool. Prostate is not enlarged and not tender. Right testis shows no mass, no swelling and no tenderness. Right testis is descended. Left testis shows no mass, no swelling and no tenderness. Left testis is descended. Circumcised. No penile erythema or penile tenderness. No discharge found.  Lymphadenopathy:    He has no cervical adenopathy.       Right: No inguinal adenopathy present.       Left: No inguinal adenopathy present.  Skin: He is not diaphoretic.  Vitals reviewed.   Lab Results  Component Value Date   WBC 10.2 10/19/2012   HGB 15.3 10/19/2012   HCT 44.5 10/19/2012   PLT 173 10/19/2012   GLUCOSE 118* 07/11/2013   CHOL 272* 07/11/2013   TRIG 149.0 07/11/2013   HDL 37.10* 07/11/2013   LDLDIRECT 163.0 10/11/2012   LDLCALC 205* 07/11/2013   ALT 45 10/19/2012   AST 31 10/19/2012   NA 137 07/11/2013   K 4.8 07/11/2013   CL 105 07/11/2013   CREATININE 1.2 07/11/2013   BUN 16 07/11/2013   CO2 27 07/11/2013   TSH 2.10 09/12/2012   PSA 0.66 11/04/2011   INR 0.89 10/13/2012   HGBA1C 6.2 07/11/2013        Assessment & Plan:

## 2014-03-12 NOTE — Assessment & Plan Note (Signed)
I will recheck his A1C today to see if he has developed DM2

## 2014-03-12 NOTE — Patient Instructions (Signed)

## 2014-03-12 NOTE — Assessment & Plan Note (Addendum)
He refused flu vax and zostavax Exam done Labs ordered Pt ed material was given

## 2014-07-02 ENCOUNTER — Ambulatory Visit (INDEPENDENT_AMBULATORY_CARE_PROVIDER_SITE_OTHER): Payer: Medicare Other | Admitting: Internal Medicine

## 2014-07-02 ENCOUNTER — Encounter: Payer: Self-pay | Admitting: Internal Medicine

## 2014-07-02 VITALS — BP 134/82 | HR 67 | Temp 98.8°F | Resp 18 | Ht 69.0 in | Wt 239.1 lb

## 2014-07-02 DIAGNOSIS — Z23 Encounter for immunization: Secondary | ICD-10-CM

## 2014-07-02 DIAGNOSIS — Z9989 Dependence on other enabling machines and devices: Secondary | ICD-10-CM | POA: Insufficient documentation

## 2014-07-02 DIAGNOSIS — I872 Venous insufficiency (chronic) (peripheral): Secondary | ICD-10-CM

## 2014-07-02 DIAGNOSIS — G4733 Obstructive sleep apnea (adult) (pediatric): Secondary | ICD-10-CM

## 2014-07-02 DIAGNOSIS — R609 Edema, unspecified: Secondary | ICD-10-CM

## 2014-07-02 MED ORDER — VASCULERA PO TABS: 1.0000 | ORAL_TABLET | Freq: Every day | ORAL | Status: DC

## 2014-07-02 NOTE — Assessment & Plan Note (Signed)
I have asked him to try vasculera for this

## 2014-07-02 NOTE — Patient Instructions (Signed)

## 2014-07-02 NOTE — Assessment & Plan Note (Signed)
Letter written for his new insurance I have asked him to see sleep med for an updated evaluation

## 2014-07-02 NOTE — Progress Notes (Signed)
Pre visit review using our clinic review tool, if applicable. No additional management support is needed unless otherwise documented below in the visit note. 

## 2014-07-02 NOTE — Progress Notes (Signed)
Subjective:    Patient ID: James [name], male    DOB: [birth year], 64 y.o.   MRN: [id number]  HPI Comments: He returns for a pre-op visit, left hip surgery in 3 months. He complains of edema in both legs for about 1-2 years and he also complains about discoloration around both ankles for about one year. He has OSA and he tells me that he uses CPAP, he has new insurance that requires documentation of disease state - he tells me that his prior sleep study was done years ago by a Dr. Ardis Hughs who is not in this city any more.     Review of Systems  Constitutional: Negative.  Negative for fever, chills, diaphoresis, activity change, appetite change, fatigue and unexpected weight change.  HENT: Negative.   Eyes: Negative.   Respiratory: Positive for apnea. Negative for cough, choking, chest tightness, shortness of breath, wheezing and stridor.   Cardiovascular: Positive for leg swelling. Negative for chest pain and palpitations.  Gastrointestinal: Negative.  Negative for nausea, vomiting, abdominal pain, diarrhea, constipation and blood in stool.  Endocrine: Negative.   Genitourinary: Negative.  Negative for dysuria, urgency, frequency, hematuria, decreased urine volume and difficulty urinating.  Musculoskeletal: Positive for back pain and arthralgias. Negative for myalgias, joint swelling, neck pain and neck stiffness.  Skin: Positive for color change. Negative for rash.  Allergic/Immunologic: Negative.   Neurological: Negative.   Hematological: Negative.  Negative for adenopathy. Does not bruise/bleed easily.  Psychiatric/Behavioral: Negative.        Objective:   Physical Exam  Constitutional: He is oriented to person, place, and time. He appears well-developed and well-nourished. No distress.  HENT:  Head: Normocephalic and atraumatic.  Mouth/Throat: No oropharyngeal exudate.  Eyes: Conjunctivae are normal. Right eye exhibits no discharge. Left eye exhibits no discharge. No scleral  icterus.  Neck: Normal range of motion. Neck supple. No JVD present. No tracheal deviation present. No thyromegaly present.  Cardiovascular: Normal rate, regular rhythm, normal heart sounds and intact distal pulses.  Exam reveals no gallop and no friction rub.   No murmur heard. Pulmonary/Chest: Effort normal and breath sounds normal. No stridor. No respiratory distress. He has no wheezes. He has no rales. He exhibits no tenderness.  Abdominal: Soft. Bowel sounds are normal. He exhibits no distension and no mass. There is no tenderness. There is no rebound and no guarding.  Musculoskeletal: Normal range of motion. He exhibits edema (there is 1-2 + pitting edema in BLE). He exhibits no tenderness.  Around both ankles and feet there is mild hyperpigmentation (brownish) with some induration, there are no ulcers or breakdown.  Lymphadenopathy:    He has no cervical adenopathy.  Neurological: He is oriented to person, place, and time.  Skin: Skin is warm and dry. No rash noted. He is not diaphoretic. No erythema. No pallor.  Psychiatric: He has a normal mood and affect. His behavior is normal. Judgment and thought content normal.  Vitals reviewed.    Lab Results  Component Value Date   WBC 6.8 03/12/2014   HGB 16.0 03/12/2014   HCT 47.9 03/12/2014   PLT 172.0 03/12/2014   GLUCOSE 118* 03/12/2014   CHOL 239* 03/12/2014   TRIG 152.0* 03/12/2014   HDL 36.80* 03/12/2014   LDLDIRECT 163.0 10/11/2012   LDLCALC 172* 03/12/2014   ALT 28 03/12/2014   AST 20 03/12/2014   NA 139 03/12/2014   K 4.5 03/12/2014   CL 107 03/12/2014   CREATININE 1.1 03/12/2014  BUN 19 03/12/2014   CO2 23 03/12/2014   TSH 1.82 03/12/2014   PSA 1.58 03/12/2014   INR 0.89 10/13/2012   HGBA1C 6.1 03/12/2014       Assessment & Plan:

## 2014-09-18 ENCOUNTER — Ambulatory Visit: Payer: Self-pay | Admitting: Orthopedic Surgery

## 2014-09-18 NOTE — Progress Notes (Signed)
Preoperative surgical orders have been place into the [facility] hospital system for James [name] on 09/18/2014, 9:30 AM  by Mickel Crow for surgery on 10-05-2014.  Preop Total Hip - Anterior Approach orders including IV Tylenol, and IV Decadron as long as there are no contraindications to the above medications. Arlee Muslim, PA-C

## 2014-09-22 NOTE — Pre-Procedure Instructions (Addendum)
James [name]  09/22/2014   Your procedure is scheduled on:  May 27  Report to [facility] Admitting at 05:30 AM.  Call this number if you have problems the morning of surgery: [phone]   Remember:   Do not eat food or drink liquids after midnight.   Take these medicines the morning of surgery with A SIP OF WATER: None   STOP Cinnamon, Flaxseed, Krill Oil, Multiple Vitamins, Naproxen May 20   STOP/ Do not take Aspirin, Aleve, Naproxen, Advil, Ibuprofen, Motrin, Vitamins, Herbs, or Supplements starting May 20   Do not wear jewelry.  Do not wear lotions, powders, or colognes. You may wear deodorant.  Men may shave face and neck.  Do not bring valuables to the hospital.  [facility] is not responsible for any belongings or valuables.               Contacts, dentures or bridgework may not be worn into surgery.  Leave suitcase in the car. After surgery it may be brought to your room.  For patients admitted to the hospital, discharge time is determined by your treatment team.   Special Instructions: attached-"[place] - Preparing for Surgery"

## 2014-09-24 ENCOUNTER — Encounter (HOSPITAL_COMMUNITY)
Admission: RE | Admit: 2014-09-24 | Discharge: 2014-09-24 | Disposition: A | Discharge status: Home / Self Care | Payer: Medicare Other | Source: Ambulatory Visit | Attending: Orthopedic Surgery | Admitting: Orthopedic Surgery

## 2014-09-24 ENCOUNTER — Other Ambulatory Visit (HOSPITAL_COMMUNITY): Payer: Self-pay

## 2014-09-24 LAB — URINALYSIS, ROUTINE W REFLEX MICROSCOPIC
Bilirubin Urine: NEGATIVE
Glucose, UA: NEGATIVE mg/dL
Hgb urine dipstick: NEGATIVE
Ketones, ur: NEGATIVE mg/dL
Leukocytes, UA: NEGATIVE
Nitrite: NEGATIVE
Protein, ur: NEGATIVE mg/dL
Specific Gravity, Urine: 1.019 (ref 1.005–1.030)
Urobilinogen, UA: 0.2 mg/dL (ref 0.0–1.0)
pH: 5 (ref 5.0–8.0)

## 2014-09-24 LAB — CBC
HCT: 50 % (ref 39.0–52.0)
Hemoglobin: 17.4 g/dL — ABNORMAL HIGH (ref 13.0–17.0)
MCH: 31.4 pg (ref 26.0–34.0)
MCHC: 34.8 g/dL (ref 30.0–36.0)
MCV: 90.1 fL (ref 78.0–100.0)
Platelets: 176 10*3/uL (ref 150–400)
RBC: 5.55 MIL/uL (ref 4.22–5.81)
RDW: 13.5 % (ref 11.5–15.5)
WBC: 6.8 10*3/uL (ref 4.0–10.5)

## 2014-09-24 LAB — COMPREHENSIVE METABOLIC PANEL
ALT: 35 U/L (ref 17–63)
AST: 30 U/L (ref 15–41)
Albumin: 4.3 g/dL (ref 3.5–5.0)
Alkaline Phosphatase: 62 U/L (ref 38–126)
Anion gap: 8 (ref 5–15)
BUN: 15 mg/dL (ref 6–20)
CO2: 25 mmol/L (ref 22–32)
Calcium: 9.5 mg/dL (ref 8.9–10.3)
Chloride: 108 mmol/L (ref 101–111)
Creatinine, Ser: 1.16 mg/dL (ref 0.61–1.24)
GFR calc Af Amer: >60 mL/min (ref >60)
GFR calc non Af Amer: >60 mL/min (ref >60)
Glucose, Bld: 110 mg/dL — ABNORMAL HIGH (ref 65–99)
Potassium: 4.3 mmol/L (ref 3.5–5.1)
Sodium: 141 mmol/L (ref 135–145)
Total Bilirubin: 0.8 mg/dL (ref 0.3–1.2)
Total Protein: 7.2 g/dL (ref 6.5–8.1)

## 2014-09-24 LAB — ABO/RH: ABO/RH(D): O POS

## 2014-09-24 LAB — TYPE AND SCREEN
ABO/RH(D): O POS
Antibody Screen: NEGATIVE

## 2014-09-24 LAB — SURGICAL PCR SCREEN
MRSA, PCR: NEGATIVE
Staphylococcus aureus: NEGATIVE

## 2014-09-24 LAB — PROTIME-INR
INR: 0.96 (ref 0.00–1.49)
Prothrombin Time: 12.9 seconds (ref 11.6–15.2)

## 2014-09-24 LAB — APTT: aPTT: 25 seconds (ref 24–37)

## 2014-09-24 MED ORDER — CHLORHEXIDINE GLUCONATE 4 % EX LIQD: 60.0000 mL | Freq: Once | CUTANEOUS | Status: DC

## 2014-09-26 ENCOUNTER — Ambulatory Visit (INDEPENDENT_AMBULATORY_CARE_PROVIDER_SITE_OTHER): Payer: Medicare Other | Admitting: Internal Medicine

## 2014-09-26 ENCOUNTER — Encounter: Payer: Self-pay | Admitting: Internal Medicine

## 2014-09-26 VITALS — BP 122/70 | HR 67 | Temp 97.9°F | Resp 17 | Wt 242.5 lb

## 2014-09-26 DIAGNOSIS — G518 Other disorders of facial nerve: Secondary | ICD-10-CM

## 2014-09-26 DIAGNOSIS — J310 Chronic rhinitis: Secondary | ICD-10-CM

## 2014-09-26 MED ORDER — GABAPENTIN 100 MG PO CAPS: ORAL_CAPSULE | ORAL | Status: DC

## 2014-09-26 NOTE — Patient Instructions (Signed)
Assess response to the gabapentin one every 8 hours as needed. If it is partially beneficial, it can be increased up to a total of 3 pills every 8 hours as needed. This increase of 1 pill each dose  should take place over 72 hours at least.If 300 mg is effective dose ; there is a 300 mg pill.  Plain Mucinex (NOT D) for thick secretions ;force NON dairy fluids .   Nasal cleansing in the shower as discussed with lather of mild shampoo.After 10 seconds wash off lather while  exhaling through nostrils. Make sure that all residual soap is removed to prevent irritation.  Flonase OR Nasacort AQ 1 spray in each nostril twice a day as needed. Use the "crossover" technique into opposite nostril spraying toward opposite ear @ 45 degree angle, not straight up into nostril.  Plain Allegra (NOT D )  160 daily , Loratidine 10 mg , OR Zyrtec 10 mg @ bedtime  as needed for itchy eyes & sneezing.

## 2014-09-26 NOTE — Progress Notes (Signed)
   Subjective:    Patient ID: James [name], male    DOB: 1950/06/28, 64 y.o.   MRN: [id number]  HPI   One week ago he had pain in the left ear and associated headache as pain in the left side of the face and crown but this has resolved.  Now he has pain in the right ear with a similar distribution of headache. This is exacerbated when he combs his hair over the right crown.  He's had some nasal congestion as well as some watery eyes. He describes some fatigue and dental pain. The ear pain is described as dull up to 2-3 usually. It can be as high as a 5. Tylenol provided moderate benefit.   Headache is described as dull without associated phonophobia or photophobia. There's been no associated visual disturbances. Aleve did help the headache.  The main reason he is here is that he has total hip scheduled for 10/05/14;he is concerned these symptoms would preclude that surgery. He does have sleep apnea. He has been cleared for surgery by his primary care physician.  Review of Systems Nasal purulence,  sore throat ,  or otic discharge denied. No fever , chills or sweats. Extrinsic symptoms of  sneezing or angioedema are denied. There is no significant cough, sputum production, wheezing,or  paroxysmal nocturnal dyspnea      Objective:   Physical Exam  Pertinent or positive findings include: Some ptosis is present bilaterally.  He has minimal wax in otic canals.  There is asymmetric hearing loss, greater on the left.  There is septal dislocation. Erythema of the right nasal septum is present greater than the left.  He has decreased range of motion of the cervical spine. Cranial nerve exam is otherwise unremarkable.  General appearance :adequately nourished; in no distress. Eyes: No conjunctival inflammation or scleral icterus is present. Oral exam:  Lips and gums are healthy appearing.There is no oropharyngeal erythema or exudate noted. Dental hygiene is good. Heart:  Normal rate and  regular rhythm. S1 and S2 normal without gallop, murmur, click, rub or other extra sounds   Lungs:Chest clear to auscultation; no wheezes, rhonchi,rales ,or rubs present.No increased work of breathing.  Abdomen: bowel sounds normal, soft and non-tender without masses, organomegaly or hernias noted.  No guarding or rebound.  Vascular : all pulses equal ; no bruits present. Skin:Warm & dry.  Intact without suspicious lesions or rashes ; no tenting or jaundice  Lymphatic: No lymphadenopathy is noted about the head, neck, axilla Neuro: Strength, tone & DTRs normal.        Assessment & Plan:  #1 ear pain with history suggesting a facial neuralgia  #2 nonallergic rhinitis  Plan: See orders recommendations

## 2014-09-26 NOTE — Progress Notes (Signed)
Pre visit review using our clinic review tool, if applicable. No additional management support is needed unless otherwise documented below in the visit note. 

## 2014-10-03 NOTE — H&P (Signed)
TOTAL HIP ADMISSION H&P  Patient is admitted for left total hip arthroplasty.  Subjective:  Chief Complaint: left hip pain  HPI: James [name], 64 y.o. male, has a history of pain and functional disability in the left hip(s) due to arthritis and patient has failed non-surgical conservative treatments for greater than 12 weeks to include NSAID's and/or analgesics, flexibility and strengthening excercises, weight reduction as appropriate and activity modification.  Onset of symptoms was gradual starting 3 years ago with gradually worsening course since that time.The patient noted no past surgery on the left hip(s).  Patient currently rates pain in the left hip at 7 out of 10 with activity. Patient has night pain, worsening of pain with activity and weight bearing, pain that interfers with activities of daily living, pain with passive range of motion and crepitus. Patient has evidence of periarticular osteophytes and joint space narrowing by imaging studies. This condition presents safety issues increasing the risk of falls.  There is no current active infection.  Patient Active Problem List   Diagnosis Date Noted  . OSA on CPAP 07/02/2014  . Chronic venous insufficiency 07/02/2014  . Edema 07/02/2014  . BPH with obstruction/lower urinary tract symptoms 07/12/2013  . Allergic rhinitis 07/12/2013  . Routine health maintenance 07/31/2011  . Hypogonadism male 07/29/2011  . Obesity 06/08/2007  . Hyperglycemia 06/08/2007  . Hyperlipidemia with target LDL less than 130 01/18/2007  . GERD 01/18/2007  . Spinal stenosis of lumbar region 01/18/2007   Past Medical History  Diagnosis Date  . Wrist pain, left   . Depressive disorder, not elsewhere classified   . Fasting hyperglycemia   . Special screening for malignant neoplasm of prostate   . Routine general medical examination at a health care facility   . Spinal stenosis, unspecified region other than cervical   . GERD (gastroesophageal reflux  disease)   . Colon polyps     10/27/2002, repeat letter 09/17/2007  . Other and unspecified hyperlipidemia   . MPN (myeloproliferative neoplasm)     1st detected 06/04/1998  . Hemoptysis     abnormal CT Chest 01/29/10 - ? new GG changes RUL > not viz on plain cxr 02/26/2010  . Positive PPD 1965    "non reactive in 2012" (10/19/2012)  . Obesity   . OSA on CPAP   . Arthritis     "knees, hips, back" (10/19/2012)  . Chronic lower back pain     Past Surgical History  Procedure Laterality Date  . Tonsillectomy  1950's  . Anterior cruciate ligament repair Left 1967  . Wrist reconstruction  12/2009    'proximal row carpectomy" [name]  . Cardiac catheterization  2000  . Colonoscopy w/ polypectomy    . Flexible bronchoscopy  10/19/2012    Flexible video fiberoptic bronchoscopy with electromagnetic navigation and biopsies.  . Chest tube insertion Right 10/19/2012    post bronch  . Video bronchoscopy with endobronchial navigation N/A 10/19/2012    Procedure: VIDEO BRONCHOSCOPY WITH ENDOBRONCHIAL NAVIGATION;  Surgeon: [name], MD;  Location: [facility];  Service: Thoracic;  Laterality: N/A;      Current outpatient prescriptions:  .  CINNAMON PO, Take 2.5 mLs by mouth daily., Disp: , Rfl:  .  cyclobenzaprine (FLEXERIL) 5 MG tablet, Take 5-10 mg by mouth every 8 (eight) hours as needed. For muscle spasms, Disp: , Rfl:  .  FLAXSEED, LINSEED, PO, Take 60 mLs by mouth daily., Disp: , Rfl:  .  Krill Oil 500 MG CAPS, Take  1 capsule by mouth daily., Disp: , Rfl:  .  Multiple Vitamin (MULTIVITAMIN WITH MINERALS) TABS, Take 1 tablet by mouth daily., Disp: , Rfl:  .  naproxen sodium (ANAPROX) 220 MG tablet, Take 220 mg by mouth 4 (four) times daily as needed. For pain, Disp: , Rfl:  .  Sodium Bicarbonate-Citric Acid (ALKA-SELTZER HEARTBURN PO), Take 1 tablet by mouth at bedtime as needed. For heartburn, Disp: , Rfl:  .  sodium chloride (OCEAN) 0.65 % SOLN nasal spray, Place 1 spray into both nostrils at  bedtime., Disp: , Rfl:  .  gabapentin (NEURONTIN) 100 MG capsule, One pill every eight hours as needed; dose may be increased by one pill each dose after 72 hours if only partially effective, Disp: 30 capsule, Rfl: 2  Allergies  Allergen Reactions  . Amoxicillin Itching    History  Substance Use Topics  . Smoking status: Never Smoker   . Smokeless tobacco: Never Used  . Alcohol Use: 0.6 oz/week    1 Cans of beer per week    Family History  Problem Relation Age of Onset  . Heart disease Father   . Heart disease Paternal Uncle   . Prostate cancer Neg Hx   . Colon cancer Neg Hx   . Hypertension Neg Hx   . Hyperlipidemia Neg Hx   . Diabetes Neg Hx      Review of Systems  Constitutional: Negative.   HENT: Negative.   Eyes: Negative.   Respiratory: Negative.   Cardiovascular: Negative.   Gastrointestinal: Positive for heartburn. Negative for nausea, vomiting, abdominal pain, diarrhea, constipation, blood in stool and melena.  Genitourinary: Positive for frequency. Negative for dysuria, urgency, hematuria and flank pain.  Musculoskeletal: Positive for back pain and joint pain. Negative for myalgias, falls and neck pain.       Left hip pain  Skin: Negative.   Neurological: Negative.   Endo/Heme/Allergies: Negative.   Psychiatric/Behavioral: Negative.     Objective:  Physical Exam  Constitutional: He is oriented to person, place, and time. He appears well-developed. No distress.  Obese  HENT:  Head: Normocephalic and atraumatic.  Right Ear: External ear normal.  Left Ear: External ear normal.  Nose: Nose normal.  Mouth/Throat: Oropharynx is clear and moist.  Eyes: Conjunctivae and EOM are normal.  Neck: Normal range of motion. Neck supple.  Cardiovascular: Normal rate, regular rhythm, normal heart sounds and intact distal pulses.   No murmur heard. Respiratory: Effort normal and breath sounds normal. No respiratory distress. He has no wheezes.  GI: Soft. Bowel sounds  are normal. He exhibits no distension. There is no tenderness.  Musculoskeletal:       Right hip: Normal.       Left hip: He exhibits decreased range of motion and crepitus.       Right knee: Normal.       Left knee: He exhibits decreased range of motion and swelling. He exhibits no effusion and no erythema. Tenderness found. Medial joint line and lateral joint line tenderness noted.  His left hip can be flexed to about 90 degrees, no internal rotation, about 10 external rotation, and 10 abduction. The right hip has normal range of motion. The left knee shows varus deformity with range of motion 10 to 125. There is moderate crepitus on range of motion and tender medial greater than lateral with no instability noted. His gait pattern is signifcantly antalgic with a significant abductor lurch and a varus thrust to his knee.  Neurological:  He is alert and oriented to person, place, and time. He has normal strength and normal reflexes. No sensory deficit.  Skin: No rash noted. He is not diaphoretic. No erythema.  Psychiatric: He has a normal mood and affect. His behavior is normal.     Vitals  Weight: 235 lb Height: 69in Body Surface Area: 2.21 m Body Mass Index: 34.7 kg/m  Pulse: 72 (Regular)  BP: 144/82 (Sitting, Left Arm, Standard)  Imaging Review Plain radiographs demonstrate severe degenerative joint disease of the left hip(s). The bone quality appears to be fair for age and reported activity level.  Assessment/Plan:  End stage primary osteoarthritis, left hip(s)  The patient history, physical examination, clinical judgement of the provider and imaging studies are consistent with end stage degenerative joint disease of the left hip(s) and total hip arthroplasty is deemed medically necessary. The treatment options including medical management, injection therapy, arthroscopy and arthroplasty were discussed at length. The risks and benefits of total hip arthroplasty were  presented and reviewed. The risks due to aseptic loosening, infection, stiffness, dislocation/subluxation,  thromboembolic complications and other imponderables were discussed.  The patient acknowledged the explanation, agreed to proceed with the plan and consent was signed. Patient is being admitted for inpatient treatment for surgery, pain control, PT, OT, prophylactic antibiotics, VTE prophylaxis, progressive ambulation and ADL's and discharge planning.The patient is planning to be discharged home with home health services    Topical TXA PCP: Dr. Jerl Santos, PA-C

## 2014-10-04 MED ORDER — VANCOMYCIN HCL 10 G IV SOLR
1500.0000 mg | INTRAVENOUS | Status: AC
  Administered 2014-10-05: 1500 mg via INTRAVENOUS
  Filled 2014-10-04: qty 1500

## 2014-10-04 MED ORDER — TRANEXAMIC ACID 1000 MG/10ML IV SOLN
2000.0000 mg | INTRAVENOUS | Status: DC
  Filled 2014-10-04: qty 20

## 2014-10-05 ENCOUNTER — Inpatient Hospital Stay (HOSPITAL_COMMUNITY): Payer: Medicare Other | Admitting: Anesthesiology

## 2014-10-05 ENCOUNTER — Inpatient Hospital Stay (HOSPITAL_COMMUNITY)
Admission: RE | Admit: 2014-10-05 | Discharge: 2014-10-06 | DRG: 470 | Disposition: A | Discharge status: Home Health | Payer: Medicare Other | Source: Ambulatory Visit | Attending: Orthopedic Surgery | Admitting: Orthopedic Surgery

## 2014-10-05 ENCOUNTER — Inpatient Hospital Stay (HOSPITAL_COMMUNITY): Payer: Medicare Other

## 2014-10-05 ENCOUNTER — Encounter (HOSPITAL_COMMUNITY)
Admission: RE | Disposition: A | Discharge status: Home Health | Payer: Self-pay | Source: Ambulatory Visit | Attending: Orthopedic Surgery

## 2014-10-05 ENCOUNTER — Encounter (HOSPITAL_COMMUNITY): Payer: Self-pay | Admitting: *Deleted

## 2014-10-05 DIAGNOSIS — Z79899 Other long term (current) drug therapy: Secondary | ICD-10-CM

## 2014-10-05 DIAGNOSIS — F329 Major depressive disorder, single episode, unspecified: Secondary | ICD-10-CM | POA: Diagnosis present

## 2014-10-05 DIAGNOSIS — E669 Obesity, unspecified: Secondary | ICD-10-CM | POA: Diagnosis present

## 2014-10-05 DIAGNOSIS — K219 Gastro-esophageal reflux disease without esophagitis: Secondary | ICD-10-CM | POA: Diagnosis present

## 2014-10-05 DIAGNOSIS — G4733 Obstructive sleep apnea (adult) (pediatric): Secondary | ICD-10-CM | POA: Diagnosis present

## 2014-10-05 DIAGNOSIS — Z6835 Body mass index (BMI) 35.0-35.9, adult: Secondary | ICD-10-CM | POA: Diagnosis not present

## 2014-10-05 DIAGNOSIS — N401 Enlarged prostate with lower urinary tract symptoms: Secondary | ICD-10-CM | POA: Diagnosis present

## 2014-10-05 DIAGNOSIS — M169 Osteoarthritis of hip, unspecified: Secondary | ICD-10-CM | POA: Diagnosis present

## 2014-10-05 DIAGNOSIS — G8929 Other chronic pain: Secondary | ICD-10-CM | POA: Diagnosis present

## 2014-10-05 DIAGNOSIS — M545 Low back pain: Secondary | ICD-10-CM | POA: Diagnosis present

## 2014-10-05 DIAGNOSIS — M1612 Unilateral primary osteoarthritis, left hip: Secondary | ICD-10-CM | POA: Diagnosis present

## 2014-10-05 DIAGNOSIS — Z7901 Long term (current) use of anticoagulants: Secondary | ICD-10-CM | POA: Diagnosis not present

## 2014-10-05 DIAGNOSIS — Z96649 Presence of unspecified artificial hip joint: Secondary | ICD-10-CM

## 2014-10-05 DIAGNOSIS — M25552 Pain in left hip: Secondary | ICD-10-CM | POA: Diagnosis present

## 2014-10-05 DIAGNOSIS — I872 Venous insufficiency (chronic) (peripheral): Secondary | ICD-10-CM | POA: Diagnosis present

## 2014-10-05 DIAGNOSIS — N138 Other obstructive and reflux uropathy: Secondary | ICD-10-CM | POA: Diagnosis present

## 2014-10-05 DIAGNOSIS — E785 Hyperlipidemia, unspecified: Secondary | ICD-10-CM | POA: Diagnosis present

## 2014-10-05 DIAGNOSIS — Z8249 Family history of ischemic heart disease and other diseases of the circulatory system: Secondary | ICD-10-CM | POA: Diagnosis not present

## 2014-10-05 DIAGNOSIS — Z419 Encounter for procedure for purposes other than remedying health state, unspecified: Secondary | ICD-10-CM

## 2014-10-05 HISTORY — PX: TOTAL HIP ARTHROPLASTY: SHX124

## 2014-10-05 SURGERY — ARTHROPLASTY, HIP, TOTAL, ANTERIOR APPROACH
Anesthesia: General | Site: Hip | Laterality: Left

## 2014-10-05 MED ORDER — DEXAMETHASONE SODIUM PHOSPHATE 10 MG/ML IJ SOLN
INTRAMUSCULAR | Status: AC
  Administered 2014-10-05: 10 mg via INTRAVENOUS
  Filled 2014-10-05: qty 1

## 2014-10-05 MED ORDER — BUPIVACAINE-EPINEPHRINE 0.25% -1:200000 IJ SOLN
INTRAMUSCULAR | Status: DC | PRN
  Administered 2014-10-05: 30 mL

## 2014-10-05 MED ORDER — TRANEXAMIC ACID 1000 MG/10ML IV SOLN
2000.0000 mg | INTRAVENOUS | Status: DC | PRN
  Administered 2014-10-05: 2000 mg via TOPICAL

## 2014-10-05 MED ORDER — BISACODYL 10 MG RE SUPP: 10.0000 mg | Freq: Every day | RECTAL | Status: DC | PRN

## 2014-10-05 MED ORDER — RIVAROXABAN 10 MG PO TABS
10.0000 mg | ORAL_TABLET | Freq: Every day | ORAL | Status: DC
  Administered 2014-10-06: 10 mg via ORAL
  Filled 2014-10-05: qty 1

## 2014-10-05 MED ORDER — MIDAZOLAM HCL 2 MG/2ML IJ SOLN
INTRAMUSCULAR | Status: AC
  Filled 2014-10-05: qty 2

## 2014-10-05 MED ORDER — KETOROLAC TROMETHAMINE 15 MG/ML IJ SOLN: 7.5000 mg | Freq: Four times a day (QID) | INTRAMUSCULAR | Status: AC | PRN

## 2014-10-05 MED ORDER — ONDANSETRON HCL 4 MG/2ML IJ SOLN: 4.0000 mg | Freq: Four times a day (QID) | INTRAMUSCULAR | Status: DC | PRN

## 2014-10-05 MED ORDER — SUCCINYLCHOLINE CHLORIDE 20 MG/ML IJ SOLN
INTRAMUSCULAR | Status: DC | PRN
  Administered 2014-10-05: 140 mg via INTRAVENOUS

## 2014-10-05 MED ORDER — PROMETHAZINE HCL 25 MG/ML IJ SOLN
6.2500 mg | INTRAMUSCULAR | Status: DC | PRN
  Administered 2014-10-05: 6.25 mg via INTRAVENOUS

## 2014-10-05 MED ORDER — PROMETHAZINE HCL 25 MG/ML IJ SOLN
INTRAMUSCULAR | Status: AC
  Filled 2014-10-05: qty 1

## 2014-10-05 MED ORDER — OXYCODONE HCL 5 MG PO TABS: 5.0000 mg | ORAL_TABLET | ORAL | Status: DC | PRN

## 2014-10-05 MED ORDER — ACETAMINOPHEN 325 MG PO TABS: 650.0000 mg | ORAL_TABLET | Freq: Four times a day (QID) | ORAL | Status: DC | PRN

## 2014-10-05 MED ORDER — PROPOFOL 10 MG/ML IV BOLUS
INTRAVENOUS | Status: AC
  Filled 2014-10-05: qty 20

## 2014-10-05 MED ORDER — ONDANSETRON HCL 4 MG/2ML IJ SOLN
INTRAMUSCULAR | Status: AC
  Filled 2014-10-05: qty 2

## 2014-10-05 MED ORDER — MIDAZOLAM HCL 5 MG/5ML IJ SOLN
INTRAMUSCULAR | Status: DC | PRN
  Administered 2014-10-05: 2 mg via INTRAVENOUS

## 2014-10-05 MED ORDER — LIDOCAINE HCL (CARDIAC) 20 MG/ML IV SOLN
INTRAVENOUS | Status: DC | PRN
  Administered 2014-10-05: 100 mg via INTRAVENOUS

## 2014-10-05 MED ORDER — LACTATED RINGERS IV SOLN
INTRAVENOUS | Status: DC | PRN
  Administered 2014-10-05: 07:00:00 via INTRAVENOUS

## 2014-10-05 MED ORDER — FENTANYL CITRATE (PF) 100 MCG/2ML IJ SOLN
INTRAMUSCULAR | Status: DC | PRN
  Administered 2014-10-05 (×3): 50 ug via INTRAVENOUS
  Administered 2014-10-05: 100 ug via INTRAVENOUS
  Administered 2014-10-05 (×3): 50 ug via INTRAVENOUS

## 2014-10-05 MED ORDER — METHOCARBAMOL 500 MG PO TABS: 500.0000 mg | ORAL_TABLET | Freq: Four times a day (QID) | ORAL | Status: DC | PRN

## 2014-10-05 MED ORDER — TRAMADOL HCL 50 MG PO TABS: 50.0000 mg | ORAL_TABLET | Freq: Four times a day (QID) | ORAL | Status: DC | PRN

## 2014-10-05 MED ORDER — METHOCARBAMOL 500 MG PO TABS
500.0000 mg | ORAL_TABLET | Freq: Four times a day (QID) | ORAL | Status: DC | PRN
  Administered 2014-10-05: 500 mg via ORAL
  Filled 2014-10-05 (×2): qty 1

## 2014-10-05 MED ORDER — ACETAMINOPHEN 500 MG PO TABS
1000.0000 mg | ORAL_TABLET | Freq: Four times a day (QID) | ORAL | Status: AC
  Administered 2014-10-05 – 2014-10-06 (×3): 1000 mg via ORAL
  Filled 2014-10-05 (×3): qty 2

## 2014-10-05 MED ORDER — ONDANSETRON HCL 4 MG PO TABS: 4.0000 mg | ORAL_TABLET | Freq: Four times a day (QID) | ORAL | Status: DC | PRN

## 2014-10-05 MED ORDER — PHENOL 1.4 % MT LIQD: 1.0000 | OROMUCOSAL | Status: DC | PRN

## 2014-10-05 MED ORDER — ROCURONIUM BROMIDE 100 MG/10ML IV SOLN
INTRAVENOUS | Status: DC | PRN
  Administered 2014-10-05: 50 mg via INTRAVENOUS

## 2014-10-05 MED ORDER — VANCOMYCIN HCL IN DEXTROSE 1-5 GM/200ML-% IV SOLN
1000.0000 mg | Freq: Two times a day (BID) | INTRAVENOUS | Status: AC
  Administered 2014-10-05: 1000 mg via INTRAVENOUS
  Filled 2014-10-05 (×2): qty 200

## 2014-10-05 MED ORDER — DEXAMETHASONE SODIUM PHOSPHATE 10 MG/ML IJ SOLN: 10.0000 mg | Freq: Once | INTRAMUSCULAR | Status: DC

## 2014-10-05 MED ORDER — SODIUM CHLORIDE 0.9 % IV SOLN
INTRAVENOUS | Status: DC
  Administered 2014-10-05: 11:00:00 via INTRAVENOUS

## 2014-10-05 MED ORDER — DIPHENHYDRAMINE HCL 12.5 MG/5ML PO ELIX: 12.5000 mg | ORAL_SOLUTION | ORAL | Status: DC | PRN

## 2014-10-05 MED ORDER — MENTHOL 3 MG MT LOZG
1.0000 | LOZENGE | OROMUCOSAL | Status: DC | PRN
  Administered 2014-10-05: 3 mg via ORAL
  Filled 2014-10-05: qty 9

## 2014-10-05 MED ORDER — METOCLOPRAMIDE HCL 5 MG PO TABS: 5.0000 mg | ORAL_TABLET | Freq: Three times a day (TID) | ORAL | Status: DC | PRN

## 2014-10-05 MED ORDER — FENTANYL CITRATE (PF) 250 MCG/5ML IJ SOLN
INTRAMUSCULAR | Status: AC
  Filled 2014-10-05: qty 5

## 2014-10-05 MED ORDER — ARTIFICIAL TEARS OP OINT
TOPICAL_OINTMENT | OPHTHALMIC | Status: AC
  Filled 2014-10-05: qty 3.5

## 2014-10-05 MED ORDER — HYDROMORPHONE HCL 1 MG/ML IJ SOLN
INTRAMUSCULAR | Status: AC
  Filled 2014-10-05: qty 2

## 2014-10-05 MED ORDER — ONDANSETRON HCL 4 MG/2ML IJ SOLN
INTRAMUSCULAR | Status: DC | PRN
  Administered 2014-10-05: 4 mg via INTRAVENOUS

## 2014-10-05 MED ORDER — DOCUSATE SODIUM 100 MG PO CAPS
100.0000 mg | ORAL_CAPSULE | Freq: Two times a day (BID) | ORAL | Status: DC
  Administered 2014-10-05 – 2014-10-06 (×3): 100 mg via ORAL
  Filled 2014-10-05 (×3): qty 1

## 2014-10-05 MED ORDER — TRAMADOL HCL 50 MG PO TABS
50.0000 mg | ORAL_TABLET | Freq: Four times a day (QID) | ORAL | Status: DC | PRN
  Administered 2014-10-05 – 2014-10-06 (×2): 100 mg via ORAL
  Filled 2014-10-05 (×2): qty 2

## 2014-10-05 MED ORDER — 0.9 % SODIUM CHLORIDE (POUR BTL) OPTIME
TOPICAL | Status: DC | PRN
  Administered 2014-10-05: 1000 mL

## 2014-10-05 MED ORDER — OXYCODONE HCL 5 MG PO TABS
5.0000 mg | ORAL_TABLET | ORAL | Status: DC | PRN
  Administered 2014-10-05 – 2014-10-06 (×4): 5 mg via ORAL
  Filled 2014-10-05: qty 1
  Filled 2014-10-05: qty 2
  Filled 2014-10-05: qty 1
  Filled 2014-10-05: qty 2
  Filled 2014-10-05: qty 1

## 2014-10-05 MED ORDER — DEXTROSE 5 % IV SOLN
500.0000 mg | Freq: Four times a day (QID) | INTRAVENOUS | Status: DC | PRN
  Administered 2014-10-05: 500 mg via INTRAVENOUS
  Filled 2014-10-05 (×2): qty 5

## 2014-10-05 MED ORDER — HYDROMORPHONE HCL 1 MG/ML IJ SOLN
0.2500 mg | INTRAMUSCULAR | Status: DC | PRN
  Administered 2014-10-05 (×4): 0.5 mg via INTRAVENOUS

## 2014-10-05 MED ORDER — SUCCINYLCHOLINE CHLORIDE 20 MG/ML IJ SOLN
INTRAMUSCULAR | Status: AC
  Filled 2014-10-05: qty 3

## 2014-10-05 MED ORDER — POLYETHYLENE GLYCOL 3350 17 G PO PACK: 17.0000 g | PACK | Freq: Every day | ORAL | Status: DC | PRN

## 2014-10-05 MED ORDER — SODIUM CHLORIDE 0.9 % IV SOLN
INTRAVENOUS | Status: DC
  Administered 2014-10-05: 10:00:00 via INTRAVENOUS

## 2014-10-05 MED ORDER — CYCLOBENZAPRINE HCL 5 MG PO TABS
5.0000 mg | ORAL_TABLET | Freq: Three times a day (TID) | ORAL | Status: DC | PRN
  Filled 2014-10-05: qty 2

## 2014-10-05 MED ORDER — RIVAROXABAN 10 MG PO TABS: 10.0000 mg | ORAL_TABLET | Freq: Every day | ORAL | Status: DC

## 2014-10-05 MED ORDER — DEXAMETHASONE SODIUM PHOSPHATE 10 MG/ML IJ SOLN
10.0000 mg | Freq: Once | INTRAMUSCULAR | Status: AC
  Administered 2014-10-06: 10 mg via INTRAVENOUS
  Filled 2014-10-05: qty 1

## 2014-10-05 MED ORDER — OXYCODONE HCL 5 MG PO TABS
5.0000 mg | ORAL_TABLET | ORAL | Status: DC | PRN
  Administered 2014-10-05: 10 mg via ORAL
  Filled 2014-10-05: qty 2

## 2014-10-05 MED ORDER — PROPOFOL 10 MG/ML IV BOLUS
INTRAVENOUS | Status: DC | PRN
  Administered 2014-10-05: 200 mg via INTRAVENOUS
  Administered 2014-10-05: 70 mg via INTRAVENOUS

## 2014-10-05 MED ORDER — ACETAMINOPHEN 650 MG RE SUPP: 650.0000 mg | Freq: Four times a day (QID) | RECTAL | Status: DC | PRN

## 2014-10-05 MED ORDER — METOCLOPRAMIDE HCL 5 MG/ML IJ SOLN
5.0000 mg | Freq: Three times a day (TID) | INTRAMUSCULAR | Status: DC | PRN
Start: 2014-10-05 — End: 2014-10-06

## 2014-10-05 MED ORDER — NEOSTIGMINE METHYLSULFATE 10 MG/10ML IV SOLN
INTRAVENOUS | Status: DC | PRN
  Administered 2014-10-05: 5 mg via INTRAVENOUS

## 2014-10-05 MED ORDER — BUPIVACAINE-EPINEPHRINE (PF) 0.25% -1:200000 IJ SOLN
INTRAMUSCULAR | Status: AC
  Filled 2014-10-05: qty 30

## 2014-10-05 MED ORDER — GLYCOPYRROLATE 0.2 MG/ML IJ SOLN
INTRAMUSCULAR | Status: DC | PRN
  Administered 2014-10-05: 0.6 mg via INTRAVENOUS

## 2014-10-05 MED ORDER — ACETAMINOPHEN 10 MG/ML IV SOLN
1000.0000 mg | Freq: Once | INTRAVENOUS | Status: AC
  Administered 2014-10-05: 1000 mg via INTRAVENOUS
  Filled 2014-10-05: qty 100

## 2014-10-05 MED ORDER — FLEET ENEMA 7-19 GM/118ML RE ENEM: 1.0000 | ENEMA | Freq: Once | RECTAL | Status: AC | PRN

## 2014-10-05 MED ORDER — MORPHINE SULFATE 2 MG/ML IJ SOLN
1.0000 mg | INTRAMUSCULAR | Status: DC | PRN
  Administered 2014-10-05 (×2): 2 mg via INTRAVENOUS
  Filled 2014-10-05 (×2): qty 1

## 2014-10-05 SURGICAL SUPPLY — 42 items
APL SKNCLS STERI-STRIP NONHPOA (GAUZE/BANDAGES/DRESSINGS) ×1
BENZOIN TINCTURE PRP APPL 2/3 (GAUZE/BANDAGES/DRESSINGS) ×1 IMPLANT
BLADE SAW SGTL 18X1.27X75 (BLADE) ×2 IMPLANT
CAPT HIP TOTAL 2 ×1 IMPLANT
CLSR STERI-STRIP ANTIMIC 1/2X4 (GAUZE/BANDAGES/DRESSINGS) ×3 IMPLANT
DECANTER SPIKE VIAL GLASS SM (MISCELLANEOUS) ×2 IMPLANT
DRAPE C-ARM 42X72 X-RAY (DRAPES) ×2 IMPLANT
DRAPE IMP U-DRAPE 54X76 (DRAPES) ×2 IMPLANT
DRAPE STERI IOBAN 125X83 (DRAPES) ×2 IMPLANT
DRAPE U-SHAPE 47X51 STRL (DRAPES) ×6 IMPLANT
DRSG MEPILEX BORDER 4X4 (GAUZE/BANDAGES/DRESSINGS) ×1 IMPLANT
DRSG MEPILEX BORDER 4X8 (GAUZE/BANDAGES/DRESSINGS) ×2 IMPLANT
DURAPREP 26ML APPLICATOR (WOUND CARE) ×2 IMPLANT
ELECT BLADE 6.5 EXT (BLADE) ×2 IMPLANT
ELECT REM PT RETURN 9FT ADLT (ELECTROSURGICAL) ×2
ELECTRODE REM PT RTRN 9FT ADLT (ELECTROSURGICAL) ×1 IMPLANT
EVACUATOR 1/8 PVC DRAIN (DRAIN) ×2 IMPLANT
FACESHIELD WRAPAROUND (MASK) ×4 IMPLANT
FACESHIELD WRAPAROUND OR TEAM (MASK) ×2 IMPLANT
GLOVE BIO SURGEON STRL SZ7.5 (GLOVE) ×2 IMPLANT
GLOVE BIO SURGEON STRL SZ8 (GLOVE) ×2 IMPLANT
GLOVE BIOGEL PI IND STRL 8 (GLOVE) ×2 IMPLANT
GLOVE BIOGEL PI INDICATOR 8 (GLOVE) ×2
GOWN STRL REUS W/ TWL LRG LVL3 (GOWN DISPOSABLE) ×1 IMPLANT
GOWN STRL REUS W/ TWL XL LVL3 (GOWN DISPOSABLE) ×1 IMPLANT
GOWN STRL REUS W/TWL LRG LVL3 (GOWN DISPOSABLE) ×2
GOWN STRL REUS W/TWL XL LVL3 (GOWN DISPOSABLE) ×2
KIT BASIN OR (CUSTOM PROCEDURE TRAY) ×2 IMPLANT
NDL SAFETY ECLIPSE 18X1.5 (NEEDLE) ×1 IMPLANT
NEEDLE HYPO 18GX1.5 SHARP (NEEDLE) ×2
PACK TOTAL JOINT (CUSTOM PROCEDURE TRAY) ×2 IMPLANT
PACK UNIVERSAL I (CUSTOM PROCEDURE TRAY) ×2 IMPLANT
SUT ETHIBOND NAB CT1 #1 30IN (SUTURE) ×2 IMPLANT
SUT MNCRL AB 4-0 PS2 18 (SUTURE) ×2 IMPLANT
SUT VIC AB 1 CT1 27 (SUTURE) ×2
SUT VIC AB 1 CT1 27XBRD ANTBC (SUTURE) ×1 IMPLANT
SUT VIC AB 2-0 CT1 27 (SUTURE) ×4
SUT VIC AB 2-0 CT1 TAPERPNT 27 (SUTURE) ×2 IMPLANT
SUT VLOC 180 0 24IN GS25 (SUTURE) ×2 IMPLANT
SYR CONTROL 10ML LL (SYRINGE) ×2 IMPLANT
TOWEL OR 17X26 10 PK STRL BLUE (TOWEL DISPOSABLE) ×4 IMPLANT
TRAY FOLEY CATH 16FR SILVER (SET/KITS/TRAYS/PACK) ×2 IMPLANT

## 2014-10-05 NOTE — Evaluation (Signed)
Physical Therapy Evaluation Patient Details Name: James [name] MRN: [id number] DOB: 1951-03-28 Today's Date: 10/05/2014   History of Present Illness  Pt admitted for left direct anterior THA. PMH: BPH, GERD, spinal stenosis  Clinical Impression  Pt very pleasant and initially fearful of mobility. Pt had apparently been extensively researching posterior precautions for months and throughout session had to keep educating for no restrictions with hip, ability to perform all activities and ROM. Pt purchased StdW and special chair for home to follow posterior restrictions. Pt and family educated for mobility, use of Rw and plan. Pt and family very excited for mobility and pt improvement. Pt with decreased strength, gait and pain who will benefit from acute therapy to maximize mobility and gait to increase independence.     Follow Up Recommendations Home health PT    Equipment Recommendations  Rolling walker with 5" wheels (pt purchased STdW prior to surgery and not sure if he can exchange)    Recommendations for Other Services       Precautions / Restrictions Precautions Precautions: None Precaution Comments: direct anterior Restrictions Weight Bearing Restrictions: Yes LLE Weight Bearing: Weight bearing as tolerated      Mobility  Bed Mobility Overal bed mobility: Needs Assistance Bed Mobility: Supine to Sit     Supine to sit: Min assist     General bed mobility comments: cues for sequence with min assist to pivot to EOB and elevate trunk from surface  Transfers Overall transfer level: Needs assistance   Transfers: Sit to/from Stand Sit to Stand: Min guard         General transfer comment: cues for hand placement, sequence and stepping out with left foot first  Ambulation/Gait Ambulation/Gait assistance: Min guard Ambulation Distance (Feet): 50 Feet Assistive device: Rolling walker (2 wheeled) Gait Pattern/deviations: Step-to pattern;Trunk flexed   Gait velocity  interpretation: Below normal speed for age/gender General Gait Details: cues for posture, looking up, sequence and position in RW  Stairs            Wheelchair Mobility    Modified Rankin (Stroke Patients Only)       Balance                                             Pertinent Vitals/Pain Pain Assessment: 0-10 Pain Score: 5  Pain Location: left hip  Pain Descriptors / Indicators: Sore Pain Intervention(s): Premedicated before session;Repositioned;Ice applied    Home Living Family/patient expects to be discharged to:: Private residence Living Arrangements: Spouse/significant other Available Help at Discharge: Family;Available 24 hours/day Type of Home: House Home Access: Stairs to enter   [employer] of Steps: 3 Home Layout: Two level;Bed/bath upstairs Home Equipment: Walker - standard;Shower seat - built in;Toilet riser      Prior Function Level of Independence: Independent with assistive device(s);Needs assistance   Gait / Transfers Assistance Needed: pt has been using cane for gait for 6 months  ADL's / Homemaking Assistance Needed: wife assisting with sock and shoe LLE        Hand Dominance        Extremity/Trunk Assessment   Upper Extremity Assessment: Overall WFL for tasks assessed           Lower Extremity Assessment: LLE deficits/detail   LLE Deficits / Details: limited by post op pain  Cervical / Trunk Assessment: Normal  Communication  Communication: No difficulties  Cognition Arousal/Alertness: Awake/alert Behavior During Therapy: WFL for tasks assessed/performed Overall Cognitive Status: Within Functional Limits for tasks assessed                      General Comments      Exercises        Assessment/Plan    PT Assessment Patient needs continued PT services  PT Diagnosis Difficulty walking;Acute pain   PT Problem List Decreased strength;Decreased range of motion;Decreased activity  tolerance;Decreased mobility;Decreased knowledge of use of DME;Pain  PT Treatment Interventions Gait training;Stair training;DME instruction;Patient/family education;Functional mobility training;Therapeutic activities   PT Goals (Current goals can be found in the Care Plan section) Acute Rehab PT Goals Patient Stated Goal: be able to mow and play ball with the grandkids PT Goal Formulation: With patient/family Time For Goal Achievement: 10/12/14 Potential to Achieve Goals: Good    Frequency BID   Barriers to discharge        Co-evaluation               End of Session   Activity Tolerance: Patient tolerated treatment well Patient left: in chair;with call bell/phone within reach;with family/visitor present Nurse Communication: Mobility status;Weight bearing status         Time: 1420-1457 PT Time Calculation (min) (ACUTE ONLY): 37 min   Charges:   PT Evaluation $Initial PT Evaluation Tier I: 1 Procedure PT Treatments $Therapeutic Activity: 8-22 mins   PT G CodesMelford Aase 10/05/2014, 3:10 PM Elwyn Reach, [place]

## 2014-10-05 NOTE — Op Note (Signed)
OPERATIVE REPORT  PREOPERATIVE DIAGNOSIS: Osteoarthritis of the Left hip.   POSTOPERATIVE DIAGNOSIS: Osteoarthritis of the Left  hip.   PROCEDURE: Left total hip arthroplasty, anterior approach.   SURGEON: [name], MD   ASSISTANT: Arlee Muslim, PA-C  ANESTHESIA:  General  ESTIMATED BLOOD LOSS:-500 ml  DRAINS: Hemovac x1.   COMPLICATIONS: None   CONDITION: PACU - hemodynamically stable.   BRIEF CLINICAL NOTE: James [name] is a 64 y.o. male who has advanced end-  stage arthritis of his Left  hip with progressively worsening pain and  dysfunction.The patient has failed nonoperative management and presents for  total hip arthroplasty.   PROCEDURE IN DETAIL: After successful administration of spinal  anesthetic, the traction boots for the [facility] bed were placed on both  feet and the patient was placed onto the [facility] bed, boots placed into the leg  holders. The Left hip was then isolated from the perineum with plastic  drapes and prepped and draped in the usual sterile fashion. ASIS and  greater trochanter were marked and a oblique incision was made, starting  at about 1 cm lateral and 2 cm distal to the ASIS and coursing towards  the anterior cortex of the femur. The skin was cut with a 10 blade  through subcutaneous tissue to the level of the fascia overlying the  tensor fascia lata muscle. The fascia was then incised in line with the  incision at the junction of the anterior third and posterior 2/3rd. The  muscle was teased off the fascia and then the interval between the TFL  and the rectus was developed. The Hohmann retractor was then placed at  the top of the femoral neck over the capsule. The vessels overlying the  capsule were cauterized and the fat on top of the capsule was removed.  A Hohmann retractor was then placed anterior underneath the rectus  femoris to give exposure to the entire anterior capsule. A T-shaped  capsulotomy was performed. The  edges were tagged and the femoral head  was identified.       Osteophytes are removed off the superior acetabulum.  The femoral neck was then cut in situ with an oscillating saw. Traction  was then applied to the left lower extremity utilizing the [facility]  traction. The femoral head was then removed. Retractors were placed  around the acetabulum and then circumferential removal of the labrum was  performed. Osteophytes were also removed. Reaming starts at 45 mm to  medialize and  Increased in 2 mm increments to 53 mm. We reamed in  approximately 40 degrees of abduction, 20 degrees anteversion. A 54 mm  pinnacle acetabular shell was then impacted in anatomic position under  fluoroscopic guidance with excellent purchase. We did not need to place  any additional dome screws. A 36 mm neutral + 4 marathon liner was then  placed into the acetabular shell.       The femoral lift was then placed along the lateral aspect of the femur  just distal to the vastus ridge. The leg was  externally rotated and capsule  was stripped off the inferior aspect of the femoral neck down to the  level of the lesser trochanter, this was done with electrocautery. The femur was lifted after this was performed. The  leg was then placed and extended in adducted position to essentially delivering the femur. We also removed the capsule superiorly and the  piriformis from the piriformis fossa  to gain excellent exposure of the  proximal femur. Rongeur was used to remove some cancellous bone to get  into the lateral portion of the proximal femur for placement of the  initial starter reamer. The starter broaches was placed  the starter broach  and was shown to go down the center of the canal. Broaching  with the  Corail system was then performed starting at size 8, coursing  Up to size 11. A size 11 had excellent torsional and rotational  and axial stability. The trial high offset neck was then placed  with a 36 + 1.5 trial  head. The hip was then reduced. We confirmed that  the stem was in the canal both on AP and lateral x-rays. It also has excellent sizing. The hip was reduced with outstanding stability through full extension, full external rotation,  and then flexion in adduction internal rotation. AP pelvis was taken  and the leg lengths were measured and found to be exactly equal. Hip  was then dislocated again and the femoral head and neck removed. The  femoral broach was removed. Size 11 Corail stem with a high offset  neck was then impacted into the femur following native anteversion. Has  excellent purchase in the canal. Excellent torsional and rotational and  axial stability. It is confirmed to be in the canal on AP and lateral  fluoroscopic views. The 36 + 1.5 ceramic head was placed and the hip  reduced with outstanding stability. Again AP pelvis was taken and it  confirmed that the leg lengths were equal. The wound was then copiously  irrigated with saline solution and the capsule reattached and repaired  with Ethibond suture.  20 mL of Exparel mixed with 50 mL of saline then additional 20 ml of .25% Bupivicaine injected into the capsule and into the edge of the tensor fascia lata as well as subcutaneous tissue. The fascia overlying the tensor fascia lata was  then closed with a running #1 V-Loc. Subcu was closed with interrupted  2-0 Vicryl and subcuticular running 4-0 Monocryl. Incision was cleaned  and dried. Steri-Strips and a bulky sterile dressing applied. Hemovac  drain was hooked to suction and then he was awakened and transported to  recovery in stable condition.        Please note that a surgical assistant was a medical necessity for this procedure to perform it in a safe and expeditious manner. Assistant was necessary to provide appropriate retraction of vital neurovascular structures and to prevent femoral fracture and allow for anatomic placement of the prosthesis.  [name], M.D.

## 2014-10-05 NOTE — Discharge Instructions (Signed)
Dr. [name] Total Joint Specialist [facility] [address] [place], [place] [address] [phone]    ANTERIOR APPROACH TOTAL HIP REPLACEMENT POSTOPERATIVE DIRECTIONS   Hip Rehabilitation, Guidelines Following Surgery  The results of a hip operation are greatly improved after range of motion and muscle strengthening exercises. Follow all safety measures which are given to protect your hip. If any of these exercises cause increased pain or swelling in your joint, decrease the amount until you are comfortable again. Then slowly increase the exercises. Call your caregiver if you have problems or questions.  HOME CARE INSTRUCTIONS  Most of the following instructions are designed to prevent the dislocation of your new hip.  Remove items at home which could result in a fall. This includes throw rugs or furniture in walking pathways.  Continue medications as instructed at time of discharge.  You may have some home medications which will be placed on hold until you complete the course of blood thinner medication.  You may start showering on 5/30 but do not submerge the incision under water. Just pat the incision dry and apply a dry gauze dressing on daily. Do not put on socks or shoes without following the instructions of your caregivers.  Sit on high chairs which makes it easier to stand.  Sit on chairs with arms. Use the chair arms to help push yourself up when arising.  Keep your leg on the side of the operation out in front of you when standing up.  Arrange for the use of a toilet seat elevator so you are not sitting low.    Walk with walker as instructed.  You may resume a sexual relationship in one month or when given the OK by your caregiver.  Use walker as long as suggested by your caregivers.  Other:  Weight bearing as tolerated with walker. Progress to cane with therapist as tolerated Avoid periods of inactivity such as sitting longer than an hour  when not asleep. This helps prevent blood clots.  You may return to work once you are cleared by Engineer, production.  Do not drive a car for 6 weeks or until released by your surgeon.  Do not drive while taking narcotics.  Wear elastic stockings for three weeks following surgery during the day but you may remove then at night.  Make sure you keep all of your appointments after your operation with all of your doctors and caregivers. You should call the office at the above phone number and make an appointment for approximately two weeks after the date of your surgery. Change the dressing daily and reapply a dry dressing each time. Please pick up a stool softener and laxative for home use as long as you are requiring pain medications.  ICE to the affected hip every three hours for 30 minutes at a time and then as needed for pain and swelling.  Continue to use ice on the hip for pain and swelling from surgery. You may notice swelling that will progress down to the foot and ankle.  This is normal after surgery.  Elevate the leg when you are not up walking on it.   It is important for you to complete the blood thinner medication as prescribed by your doctor.  Continue to use the breathing machine which will help keep your temperature down.  It is common for your temperature to cycle up and down following surgery, especially at night when you are not up moving around and exerting yourself.  The breathing machine keeps your lungs expanded and your temperature down.  RANGE OF MOTION AND STRENGTHENING EXERCISES  These exercises are designed to help you keep full movement of your hip joint. Follow your caregiver's or physical therapist's instructions. Perform all exercises about fifteen times, three times per day or as directed. Exercise both hips, even if you have had only one joint replacement. These exercises can be done on a training (exercise) mat, on the floor, on a table or on a bed. Use whatever works the best  and is most comfortable for you. Use music or television while you are exercising so that the exercises are a pleasant break in your day. This will make your life better with the exercises acting as a break in routine you can look forward to.  Lying on your back, slowly slide your foot toward your buttocks, raising your knee up off the floor. Then slowly slide your foot back down until your leg is straight again.  Lying on your back spread your legs as far apart as you can without causing discomfort.  Lying on your side, raise your upper leg and foot straight up from the floor as far as is comfortable. Slowly lower the leg and repeat.  Lying on your back, tighten up the muscle in the front of your thigh (quadriceps muscles). You can do this by keeping your leg straight and trying to raise your heel off the floor. This helps strengthen the largest muscle supporting your knee.  Lying on your back, tighten up the muscles of your buttocks both with the legs straight and with the knee bent at a comfortable angle while keeping your heel on the floor.   SKILLED REHAB INSTRUCTIONS: If the patient is transferred to a skilled rehab facility following release from the hospital, a list of the current medications will be sent to the facility for the patient to continue.  When discharged from the skilled rehab facility, please have the facility set up the patient's [place] prior to being released. Also, the skilled facility will be responsible for providing the patient with their medications at time of release from the facility to include their pain medication, the muscle relaxants, and their blood thinner medication. If the patient is still at the rehab facility at time of the two week follow up appointment, the skilled rehab facility will also need to assist the patient in arranging follow up appointment in our office and any transportation needs.  MAKE SURE YOU:  Understand these instructions.    Will watch your condition.  Will get help right away if you are not doing well or get worse.  Pick up stool softner and laxative for home use following surgery while on pain medications. Do not submerge incision under water. Please use good hand washing techniques while changing dressing each day. May shower starting three days after surgery. Please use a clean towel to pat the incision dry following showers. Continue to use ice for pain and swelling after surgery. Do not use any lotions or creams on the incision until instructed by your surgeon. Total Hip Protocol.

## 2014-10-05 NOTE — Anesthesia Postprocedure Evaluation (Signed)
  Anesthesia Post-op Note  Patient: James [name]  Procedure(s) Performed: Procedure(s): LEFT TOTAL HIP ARTHROPLASTY ANTERIOR APPROACH (Left)  Patient Location: PACU  Anesthesia Type:General  Level of Consciousness: awake and alert   Airway and Oxygen Therapy: Patient Spontanous Breathing  Post-op Pain: mild  Post-op Assessment: Post-op Vital signs reviewed and Patient's Cardiovascular Status Stable  Post-op Vital Signs: stable  Last Vitals:  Filed Vitals:   10/05/14 1049  BP: 123/68  Pulse: 78  Temp: 36.6 C  Resp: 18    Complications: No apparent anesthesia complications

## 2014-10-05 NOTE — Transfer of Care (Signed)
Immediate Anesthesia Transfer of Care Note  Patient: James [name]  Procedure(s) Performed: Procedure(s): LEFT TOTAL HIP ARTHROPLASTY ANTERIOR APPROACH (Left)  Patient Location: PACU  Anesthesia Type:General  Level of Consciousness: awake and alert   Airway & Oxygen Therapy: Patient Spontanous Breathing and Patient connected to nasal cannula oxygen  Post-op Assessment: Report given to RN and Post -op Vital signs reviewed and stable  Post vital signs: Reviewed and stable  Last Vitals:  Filed Vitals:   10/05/14 0921  BP: 152/70  Pulse: 64  Temp: 36.2 C  Resp: 18    Complications: No apparent anesthesia complications

## 2014-10-05 NOTE — Interval H&P Note (Signed)
History and Physical Interval Note:  10/05/2014 6:37 AM  James [name]  has presented today for surgery, with the diagnosis of left hip osteoarthritis  The various methods of treatment have been discussed with the patient and family. After consideration of risks, benefits and other options for treatment, the patient has consented to  Procedure(s): LEFT TOTAL HIP ARTHROPLASTY ANTERIOR APPROACH (Left) as a surgical intervention .  The patient's history has been reviewed, patient examined, no change in status, stable for surgery.  I have reviewed the patient's chart and labs.  Questions were answered to the patient's satisfaction.     [name]

## 2014-10-05 NOTE — Progress Notes (Signed)
Orthopedic Tech Progress Note Patient Details:  James [name] [birth year] [id number] OHF applied to bed Patient ID: James [name], male   DOB: [birth year], 64 y.o.   MRN: [id number]   Fenton Foy 10/05/2014, 12:49 PM

## 2014-10-05 NOTE — Progress Notes (Signed)
Placed patient on CPAP for the night.  Settings: auto min 4cm H20, max 20cm H20, with 2L 02 bleed in via patient's home nasal pillows.  Patient is tolerating well at this time.

## 2014-10-05 NOTE — Anesthesia Procedure Notes (Signed)
Procedure Name: Intubation Date/Time: 10/05/2014 7:17 AM Performed by: [place] Pink Pre-anesthesia Checklist: Patient identified, Emergency Drugs available, Suction available, Patient being monitored and Timeout performed Patient Re-evaluated:Patient Re-evaluated prior to inductionOxygen Delivery Method: Circle system utilized Preoxygenation: Pre-oxygenation with 100% oxygen Intubation Type: IV induction Ventilation: Two handed mask ventilation required and Oral airway inserted - appropriate to patient size Laryngoscope Size: Glidescope (large adult) Grade View: Grade I Tube type: Oral Tube size: 7.5 mm Number of attempts: 1 Airway Equipment and Method: Stylet and Video-laryngoscopy Placement Confirmation: ETT inserted through vocal cords under direct vision,  positive ETCO2 and breath sounds checked- equal and bilateral Secured at: 21 cm Tube secured with: Tape Dental Injury: Teeth and Oropharynx as per pre-operative assessment

## 2014-10-05 NOTE — Anesthesia Preprocedure Evaluation (Addendum)
Anesthesia Evaluation  Patient identified by MRN, date of birth, ID band Patient awake    Reviewed: Allergy & Precautions, NPO status , Patient's Chart, lab work & pertinent test results  Airway Mallampati: II  TM Distance: <3 FB Neck ROM: Limited    Dental  (+) Teeth Intact   Pulmonary sleep apnea ,  breath sounds clear to auscultation        Cardiovascular + Peripheral Vascular Disease Rhythm:Regular Rate:Normal     Neuro/Psych    GI/Hepatic Neg liver ROS, GERD-  ,  Endo/Other  Morbid obesity  Renal/GU      Musculoskeletal  (+) Arthritis -,   Abdominal (+) + obese,   Peds  Hematology Hct 50   Anesthesia Other Findings   Reproductive/Obstetrics                            Anesthesia Physical Anesthesia Plan  ASA: II  Anesthesia Plan: General   Post-op Pain Management:    Induction: Intravenous  Airway Management Planned: Oral ETT  Additional Equipment:   Intra-op Plan:   Post-operative Plan: Extubation in OR  Informed Consent: I have reviewed the patients History and Physical, chart, labs and discussed the procedure including the risks, benefits and alternatives for the proposed anesthesia with the patient or authorized representative who has indicated his/her understanding and acceptance.     Plan Discussed with: CRNA and Surgeon  Anesthesia Plan Comments:        Anesthesia Quick Evaluation

## 2014-10-06 LAB — BASIC METABOLIC PANEL
Anion gap: 8 (ref 5–15)
BUN: 13 mg/dL (ref 6–20)
CO2: 25 mmol/L (ref 22–32)
Calcium: 8.5 mg/dL — ABNORMAL LOW (ref 8.9–10.3)
Chloride: 104 mmol/L (ref 101–111)
Creatinine, Ser: 1.06 mg/dL (ref 0.61–1.24)
GFR calc Af Amer: >60 mL/min (ref >60)
GFR calc non Af Amer: >60 mL/min (ref >60)
Glucose, Bld: 159 mg/dL — ABNORMAL HIGH (ref 65–99)
Potassium: 4.4 mmol/L (ref 3.5–5.1)
Sodium: 137 mmol/L (ref 135–145)

## 2014-10-06 LAB — CBC
HCT: 41.3 % (ref 39.0–52.0)
Hemoglobin: 14.2 g/dL (ref 13.0–17.0)
MCH: 31.1 pg (ref 26.0–34.0)
MCHC: 34.4 g/dL (ref 30.0–36.0)
MCV: 90.4 fL (ref 78.0–100.0)
Platelets: 167 10*3/uL (ref 150–400)
RBC: 4.57 MIL/uL (ref 4.22–5.81)
RDW: 13.6 % (ref 11.5–15.5)
WBC: 12.4 10*3/uL — ABNORMAL HIGH (ref 4.0–10.5)

## 2014-10-06 MED ORDER — PANTOPRAZOLE SODIUM 40 MG PO TBEC
40.0000 mg | DELAYED_RELEASE_TABLET | Freq: Every day | ORAL | Status: DC
  Administered 2014-10-06: 40 mg via ORAL
  Filled 2014-10-06: qty 1

## 2014-10-06 MED ORDER — ALUM & MAG HYDROXIDE-SIMETH 200-200-20 MG/5ML PO SUSP: 30.0000 mL | ORAL | Status: DC | PRN

## 2014-10-06 NOTE — Evaluation (Signed)
Occupational Therapy Evaluation and Discharge Patient Details Name: James [name] MRN: [id number] DOB: 26-May-1950 Today's Date: 10/06/2014    History of Present Illness Pt admitted for left direct anterior THA. PMH: BPH, GERD, spinal stenosis   Clinical Impression   This 64 yo male admitted and underwent above presents to acute OT with all education completed with he and his wife concerning BADLs. Acute OT will sign off.    Follow Up Recommendations  No OT follow up    Equipment Recommendations  None recommended by OT       Precautions / Restrictions Precautions Precautions: None Precaution Comments: direct anterior Restrictions Weight Bearing Restrictions: No LLE Weight Bearing: Weight bearing as tolerated      Mobility Bed Mobility Overal bed mobility:  (NT, pt up in recliner)             General bed mobility comments: Pt up in recliner upon arrival  Transfers Overall transfer level: Needs assistance Equipment used: Rolling walker (2 wheeled) Transfers: Sit to/from Stand Sit to Stand: Min guard         General transfer comment: cues for LLE placement         ADL Overall ADL's : Needs assistance/impaired Eating/Feeding: Independent;Sitting   Grooming: Set up;Sitting   Upper Body Bathing: Set up;Sitting   Lower Body Bathing: Moderate assistance (with minguard sit<>stand)   Upper Body Dressing : Set up;Sitting   Lower Body Dressing: Moderate assistance (min A sit<>stand)   Toilet Transfer: Min guard;Ambulation;RW (recliner>door (x2))   Toileting- Water quality scientist and Hygiene: Min guard;Sit to/from stand     Tub/Shower Transfer Details (indicate cue type and reason): We discussed how he is to do this into and out of his shower stall   General ADL Comments: went over the most efficient way to get dressed with pt and wife, gave them some tips on how to use furniture they already have for pt to sit in. Pt had been standing to get LBD at  home--advised him that he needed to sit to do this for now--much safer. Pt reports that in the shower he normally stands and puts one foot up on built in shower seat to wash lower leg and foot--advised him not to do this for now (do not see it as safe)--that he could have the [place] practice "dry run" trials with it first.     Vision Additional Comments: No change from baseline          Pertinent Vitals/Pain Pain Assessment: 0-10 Pain Score: 5  Pain Location: left hip (after he sat down hard in recliner) Pain Descriptors / Indicators: Aching;Sore Pain Intervention(s): Premedicated before session;Ice applied     Hand Dominance Right   Extremity/Trunk Assessment Upper Extremity Assessment Upper Extremity Assessment: Overall WFL for tasks assessed           Communication Communication Communication: No difficulties   Cognition Arousal/Alertness: Awake/alert Behavior During Therapy: WFL for tasks assessed/performed Overall Cognitive Status: Within Functional Limits for tasks assessed                                Home Living Family/patient expects to be discharged to:: Private residence Living Arrangements: Spouse/significant other Available Help at Discharge: Family;Available 24 hours/day Type of Home: House Home Access: Stairs to enter [employer] of Steps: 3   Home Layout: Two level;Bed/bath upstairs     Bathroom Shower/Tub: Walk-in Hydrologist: Standard  Home Equipment: Walker - standard;Shower seat - built in;Toilet riser          Prior Functioning/Environment Level of Independence: Independent with assistive device(s);Needs assistance  Gait / Transfers Assistance Needed: pt has been using cane for gait for 6 months ADL's / Homemaking Assistance Needed: wife assisting with sock and shoe LLE        OT Diagnosis: Generalized weakness;Acute pain         OT Goals(Current goals can be found in the care plan  section) Acute Rehab OT Goals Patient Stated Goal: home today  OT Frequency:                End of Session Equipment Utilized During Treatment: Rolling walker  Activity Tolerance: Patient tolerated treatment well Patient left: in chair;with call bell/phone within reach;with family/visitor present   Time: 7628-3151 OT Time Calculation (min): 47 min Charges:  OT General Charges $OT Visit: 1 Procedure OT Evaluation $Initial OT Evaluation Tier I: 1 Procedure OT Treatments $Self Care/Home Management : 23-37 mins  Almon Register [phone] 10/06/2014, 11:28 AM

## 2014-10-06 NOTE — Care Management Note (Signed)
Case Management Note  Patient Details  Name: James [name] MRN: [id number] Date of Birth: 1950/06/12  Subjective/Objective:  64 yo M underwent L THA.                  Action/Plan: PT is recommending HHPT and a RW.   Expected Discharge Date: 10/06/14                  Expected Discharge Plan:  [place]  In-House Referral:     Discharge planning Services  CM Consult  Post Acute Care Choice:  Durable Medical Equipment, Home Health Choice offered to:     DME Arranged:  Walker rolling DME Agency:  [facility] Arranged:  PT [facility] Agency:  [place]  Status of Service:  Completed, signed off  Medicare Important Message Given:    Date Medicare IM Given:    Medicare IM give by:    Date Additional Medicare IM Given:    Additional Medicare Important Message give by:     If discussed at [place] of Stay Meetings, dates discussed:    Additional Comments: met with pt and wife to discuss D/C plan. Pt plans to return home with the support of his wife. He doesn't have a preference for a [place] agency. Provided pt with a list of [place] agencies. He prefers to use [facility] for HHPT and DME. Contacted Colletta [place] and Jeneen Rinks at [facility] for referrals. He has an elevated toilet seat.  Norina Buzzard, RN 10/06/2014, 11:55 AM

## 2014-10-06 NOTE — Progress Notes (Signed)
Physical Therapy Treatment Patient Details Name: James [name] MRN: [id number] DOB: [birth year] Today's Date: 2014/10/15    History of Present Illness  L THA, anterior approach    PT Comments    Pt making excellent progress with mobility.    Follow Up Recommendations  Home health PT     Equipment Recommendations  Rolling walker with 5" wheels    Recommendations for Other Services       Precautions / Restrictions Precautions Precautions: None Precaution Comments: direct anterior Restrictions Weight Bearing Restrictions: Yes LLE Weight Bearing: Weight bearing as tolerated    Mobility  Bed Mobility Overal bed mobility:  (NT, pt up in recliner)                Transfers Overall transfer level: Needs assistance Equipment used: Rolling walker (2 wheeled) Transfers: Sit to/from Stand Sit to Stand: Min guard         General transfer comment: cues for LLE placement  Ambulation/Gait Ambulation/Gait assistance: Supervision Ambulation Distance (Feet): 150 Feet Assistive device: Rolling walker (2 wheeled) Gait Pattern/deviations: Step-to pattern;Antalgic   Gait velocity interpretation: Below normal speed for age/gender General Gait Details: cues for posture adnd stride length   Stairs            Wheelchair Mobility    Modified Rankin (Stroke Patients Only)       Balance                                    Cognition Arousal/Alertness: Awake/alert Behavior During Therapy: WFL for tasks assessed/performed Overall Cognitive Status: Within Functional Limits for tasks assessed                      Exercises Total Joint Exercises Ankle Circles/Pumps: AROM;Both;10 reps;Supine Heel Slides: AAROM;Left;10 reps;Supine Hip ABduction/ADduction: AAROM;Left;10 reps;Supine Long Arc Quad: AROM;Left;10 reps;Seated    General Comments General comments (skin integrity, edema, etc.): wife present. Pt hopeful for DC this pm       Pertinent Vitals/Pain Pain Assessment: 0-10 Pain Score: 4  Pain Location: left hip Pain Descriptors / Indicators: Aching Pain Intervention(s): Limited activity within patient's tolerance;Monitored during session;Premedicated before session    Home Living                      Prior Function            PT Goals (current goals can now be found in the care plan section) Acute Rehab PT Goals PT Goal Formulation: With patient/family Time For Goal Achievement: 10/12/14 Potential to Achieve Goals: Good Progress towards PT goals: Progressing toward goals    Frequency  7X/week    PT Plan Current plan remains appropriate    Co-evaluation             End of Session Equipment Utilized During Treatment: Gait belt Activity Tolerance: Patient tolerated treatment well Patient left: in chair;with call bell/phone within reach;with family/visitor present     Time: 9244-6286 PT Time Calculation (min) (ACUTE ONLY): 32 min  Charges:  $Gait Training: 8-22 mins $Therapeutic Exercise: 8-22 mins                    G Codes:      Melvern Banker 10-15-2014, 10:34 AM  Lavonia Dana, PT  [phone] 10/15/2014

## 2014-10-06 NOTE — Progress Notes (Signed)
   Subjective: 1 Day Post-Op Procedure(s) (LRB): LEFT TOTAL HIP ARTHROPLASTY ANTERIOR APPROACH (Left) Patient reports pain as mild.   Patient seen in rounds with Dr. Wynelle Link. Patient is well, and has had no acute complaints or problems other than indigestion. No issues overnight other than pain in the left leg. No SOB or chest pain.    Objective: Vital signs in last 24 hours: Temp:  [97 F (36.1 C)-99.1 F (37.3 C)] 97.8 F (36.6 C) (05/28 0506) Pulse Rate:  [66-78] 71 (05/28 0506) Resp:  [15-18] 18 (05/28 0506) BP: (116-148)/(61-74) 135/70 mmHg (05/28 0506) SpO2:  [95 %-99 %] 99 % (05/28 0506)  Intake/Output from previous day:  Intake/Output Summary (Last 24 hours) at 10/06/14 0957 Last data filed at 10/06/14 0900  Gross per 24 hour  Intake    780 ml  Output    965 ml  Net   -185 ml    Intake/Output this shift: Total I/O In: 240 [P.O.:240] Out: 150 [Drains:150]  Labs:  Recent Labs  10/06/14 0449  HGB 14.2    Recent Labs  10/06/14 0449  WBC 12.4*  RBC 4.57  HCT 41.3  PLT 167    Recent Labs  10/06/14 0449  NA 137  K 4.4  CL 104  CO2 25  BUN 13  CREATININE 1.06  GLUCOSE 159*  CALCIUM 8.5*    EXAM General - Patient is Alert and Oriented Extremity - Neurologically intact Intact pulses distally Dorsiflexion/Plantar flexion intact Compartment soft Dressing - dressing C/D/I Motor Function - intact, moving foot and toes well on exam.  Hemovac pulled without difficulty.  Past Medical History  Diagnosis Date  . Wrist pain, left   . Depressive disorder, not elsewhere classified   . Fasting hyperglycemia   . Special screening for malignant neoplasm of prostate   . Routine general medical examination at a health care facility   . Spinal stenosis, unspecified region other than cervical   . GERD (gastroesophageal reflux disease)   . Colon polyps     10/27/2002, repeat letter 09/17/2007  . Other and unspecified hyperlipidemia   . MPN  (myeloproliferative neoplasm)     1st detected 06/04/1998  . Hemoptysis     abnormal CT Chest 01/29/10 - ? new GG changes RUL > not viz on plain cxr 02/26/2010  . Positive PPD 1965    "non reactive in 2012" (10/19/2012)  . Obesity   . OSA on CPAP   . Arthritis     "knees, hips, back" (10/19/2012)  . Chronic lower back pain     Assessment/Plan: 1 Day Post-Op Procedure(s) (LRB): LEFT TOTAL HIP ARTHROPLASTY ANTERIOR APPROACH (Left) Principal Problem:   OA (osteoarthritis) of hip  Estimated body mass index is 35.72 kg/(m^2) as calculated from the following:   Height as of this encounter: '5\' 9"'$  (1.753 m).   Weight as of this encounter: 109.77 kg (242 lb). Advance diet Up with therapy D/C IV fluids Discharge home with home health  DVT Prophylaxis - Xarelto Weight Bearing As Tolerated   He is doing great this morning. Will work on stairs with PT this afternoon. DC home after.    Ardeen Jourdain, PA-C Orthopaedic Surgery 10/06/2014, 9:57 AM

## 2014-10-06 NOTE — Progress Notes (Signed)
Physical Therapy Treatment Patient Details Name: James [name] MRN: [id number] DOB: 10-Mar-1951 Today's Date: 10/26/14    History of Present Illness Pt admitted for left direct anterior THA. PMH: BPH, GERD, spinal stenosis    PT Comments    Making good progress, feels ready for DC home this pm from a mobility standpoint.  Follow Up Recommendations  Home health PT     Equipment Recommendations  Rolling walker with 5" wheels    Recommendations for Other Services       Precautions / Restrictions Precautions Precautions: None Precaution Comments: direct anterior Restrictions Weight Bearing Restrictions: Yes LLE Weight Bearing: Weight bearing as tolerated    Mobility  Bed Mobility Overal bed mobility:  (NT, pt up in recliner)                Transfers Overall transfer level: Needs assistance Equipment used: Rolling walker (2 wheeled) Transfers: Sit to/from Stand Sit to Stand: Min guard         General transfer comment: wife assisting pt with transfers in room  Ambulation/Gait Ambulation/Gait assistance: Supervision Ambulation Distance (Feet): 5 Feet (distance limited as main goal of session was stairs) Assistive device: Rolling walker (2 wheeled)           Stairs Stairs: Yes Stairs assistance: Min assist Stair Management: One rail Right;Step to pattern;Forwards Number of Stairs: 3 (performed twice) General stair comments: wife and son present for stair training  Wheelchair Mobility    Modified Rankin (Stroke Patients Only)       Balance                                    Cognition Arousal/Alertness: Awake/alert Behavior During Therapy: WFL for tasks assessed/performed Overall Cognitive Status: Within Functional Limits for tasks assessed                      Exercises      General Comments General comments (skin integrity, edema, etc.): wife and son present. Pt/wife feel safe for DC home today.        Pertinent Vitals/Pain Pain Assessment: 0-10 Pain Score: 8  Pain Location: left hip after stair practice Pain Descriptors / Indicators: Aching Pain Intervention(s): Monitored during session;Patient requesting pain meds-RN notified    Home Living                      Prior Function            PT Goals (current goals can now be found in the care plan section) Acute Rehab PT Goals PT Goal Formulation: With patient/family Time For Goal Achievement: 10/12/14 Potential to Achieve Goals: Good Progress towards PT goals: Progressing toward goals    Frequency  7X/week    PT Plan Current plan remains appropriate    Co-evaluation             End of Session Equipment Utilized During Treatment: Gait belt Activity Tolerance: Patient tolerated treatment well Patient left: in chair;with call bell/phone within reach;with family/visitor present     Time: 6761-9509 PT Time Calculation (min) (ACUTE ONLY): 33 min  Charges:  $Gait Training: 23-37 mins                    G Codes:      Melvern Banker Oct 26, 2014, 3:41 PM  Lavonia Dana, PT  [phone] 2014-10-26

## 2014-10-09 ENCOUNTER — Encounter (HOSPITAL_COMMUNITY): Payer: Self-pay | Admitting: Orthopedic Surgery

## 2014-10-18 NOTE — Discharge Summary (Signed)
Physician Discharge Summary   Patient ID: James [name] MRN: [id number] DOB/AGE: [birth year] 64 y.o.  Admit date: 10/05/2014 Discharge date: 10/06/2014  Primary Diagnosis:  Osteoarthritis of the Left hip.   Admission Diagnoses:  Past Medical History  Diagnosis Date  . Wrist pain, left   . Depressive disorder, not elsewhere classified   . Fasting hyperglycemia   . Special screening for malignant neoplasm of prostate   . Routine general medical examination at a health care facility   . Spinal stenosis, unspecified region other than cervical   . GERD (gastroesophageal reflux disease)   . Colon polyps     10/27/2002, repeat letter 09/17/2007  . Other and unspecified hyperlipidemia   . MPN (myeloproliferative neoplasm)     1st detected 06/04/1998  . Hemoptysis     abnormal CT Chest 01/29/10 - ? new GG changes RUL > not viz on plain cxr 02/26/2010  . Positive PPD 1965    "non reactive in 2012" (10/19/2012)  . Obesity   . OSA on CPAP   . Arthritis     "knees, hips, back" (10/19/2012)  . Chronic lower back pain    Discharge Diagnoses:   Principal Problem:   OA (osteoarthritis) of hip  Estimated body mass index is 35.72 kg/(m^2) as calculated from the following:   Height as of this encounter: 5' 9"  (1.753 m).   Weight as of this encounter: 109.77 kg (242 lb).  Procedure(s) (LRB): LEFT TOTAL HIP ARTHROPLASTY ANTERIOR APPROACH (Left)   Consults: None  HPI: James [name] is a 64 y.o. male who has advanced end-  stage arthritis of his Left hip with progressively worsening pain and  dysfunction.The patient has failed nonoperative management and presents for  total hip arthroplasty.   Laboratory Data: Admission on 10/05/2014, Discharged on 10/06/2014  Component Date Value Ref Range Status  . WBC 10/06/2014 12.4* 4.0 - 10.5 K/uL Final  . RBC 10/06/2014 4.57  4.22 - 5.81 MIL/uL Final  . Hemoglobin 10/06/2014 14.2  13.0 - 17.0 g/dL Final  . HCT 10/06/2014 41.3  39.0 - 52.0 % Final   . MCV 10/06/2014 90.4  78.0 - 100.0 fL Final  . MCH 10/06/2014 31.1  26.0 - 34.0 pg Final  . MCHC 10/06/2014 34.4  30.0 - 36.0 g/dL Final  . RDW 10/06/2014 13.6  11.5 - 15.5 % Final  . Platelets 10/06/2014 167  150 - 400 K/uL Final  . Sodium 10/06/2014 137  135 - 145 mmol/L Final  . Potassium 10/06/2014 4.4  3.5 - 5.1 mmol/L Final  . Chloride 10/06/2014 104  101 - 111 mmol/L Final  . CO2 10/06/2014 25  22 - 32 mmol/L Final  . Glucose, Bld 10/06/2014 159* 65 - 99 mg/dL Final  . BUN 10/06/2014 13  6 - 20 mg/dL Final  . Creatinine, Ser 10/06/2014 1.06  0.61 - 1.24 mg/dL Final  . Calcium 10/06/2014 8.5* 8.9 - 10.3 mg/dL Final  . GFR calc non Af Amer 10/06/2014 >60  >60 mL/min Final  . GFR calc Af Amer 10/06/2014 >60  >60 mL/min Final   Comment: (NOTE) The eGFR has been calculated using the CKD EPI equation. This calculation has not been validated in all clinical situations. eGFR's persistently <60 mL/min signify possible Chronic Kidney Disease.   Georgiann Hahn gap 10/06/2014 8  5 - 15 Final  Hospital Outpatient Visit on 09/24/2014  Component Date Value Ref Range Status  . MRSA, PCR 09/24/2014 NEGATIVE  NEGATIVE Final  . Staphylococcus aureus  09/24/2014 NEGATIVE  NEGATIVE Final   Comment:        The Xpert SA Assay (FDA approved for NASAL specimens in patients over 18 years of age), is one component of a comprehensive surveillance program.  Test performance has been validated by [facility] for patients greater than or equal to 41 year old. It is not intended to diagnose infection nor to guide or monitor treatment.   Marland Kitchen aPTT 09/24/2014 25  24 - 37 seconds Final  . WBC 09/24/2014 6.8  4.0 - 10.5 K/uL Final  . RBC 09/24/2014 5.55  4.22 - 5.81 MIL/uL Final  . Hemoglobin 09/24/2014 17.4* 13.0 - 17.0 g/dL Final  . HCT 09/24/2014 50.0  39.0 - 52.0 % Final  . MCV 09/24/2014 90.1  78.0 - 100.0 fL Final  . MCH 09/24/2014 31.4  26.0 - 34.0 pg Final  . MCHC 09/24/2014 34.8  30.0 - 36.0 g/dL  Final  . RDW 09/24/2014 13.5  11.5 - 15.5 % Final  . Platelets 09/24/2014 176  150 - 400 K/uL Final  . Sodium 09/24/2014 141  135 - 145 mmol/L Final  . Potassium 09/24/2014 4.3  3.5 - 5.1 mmol/L Final  . Chloride 09/24/2014 108  101 - 111 mmol/L Final  . CO2 09/24/2014 25  22 - 32 mmol/L Final  . Glucose, Bld 09/24/2014 110* 65 - 99 mg/dL Final  . BUN 09/24/2014 15  6 - 20 mg/dL Final  . Creatinine, Ser 09/24/2014 1.16  0.61 - 1.24 mg/dL Final  . Calcium 09/24/2014 9.5  8.9 - 10.3 mg/dL Final  . Total Protein 09/24/2014 7.2  6.5 - 8.1 g/dL Final  . Albumin 09/24/2014 4.3  3.5 - 5.0 g/dL Final  . AST 09/24/2014 30  15 - 41 U/L Final  . ALT 09/24/2014 35  17 - 63 U/L Final  . Alkaline Phosphatase 09/24/2014 62  38 - 126 U/L Final  . Total Bilirubin 09/24/2014 0.8  0.3 - 1.2 mg/dL Final  . GFR calc non Af Amer 09/24/2014 >60  >60 mL/min Final  . GFR calc Af Amer 09/24/2014 >60  >60 mL/min Final   Comment: (NOTE) The eGFR has been calculated using the CKD EPI equation. This calculation has not been validated in all clinical situations. eGFR's persistently <60 mL/min signify possible Chronic Kidney Disease.   . Anion gap 09/24/2014 8  5 - 15 Final  . Prothrombin Time 09/24/2014 12.9  11.6 - 15.2 seconds Final  . INR 09/24/2014 0.96  0.00 - 1.49 Final  . ABO/RH(D) 09/24/2014 O POS   Final  . Antibody Screen 09/24/2014 NEG   Final  . Sample Expiration 09/24/2014 10/08/2014   Final  . Color, Urine 09/24/2014 YELLOW  YELLOW Final  . APPearance 09/24/2014 CLEAR  CLEAR Final  . Specific Gravity, Urine 09/24/2014 1.019  1.005 - 1.030 Final  . pH 09/24/2014 5.0  5.0 - 8.0 Final  . Glucose, UA 09/24/2014 NEGATIVE  NEGATIVE mg/dL Final  . Hgb urine dipstick 09/24/2014 NEGATIVE  NEGATIVE Final  . Bilirubin Urine 09/24/2014 NEGATIVE  NEGATIVE Final  . Ketones, ur 09/24/2014 NEGATIVE  NEGATIVE mg/dL Final  . Protein, ur 09/24/2014 NEGATIVE  NEGATIVE mg/dL Final  . Urobilinogen, UA 09/24/2014  0.2  0.0 - 1.0 mg/dL Final  . Nitrite 09/24/2014 NEGATIVE  NEGATIVE Final  . Leukocytes, UA 09/24/2014 NEGATIVE  NEGATIVE Final   MICROSCOPIC NOT DONE ON URINES WITH NEGATIVE PROTEIN, BLOOD, LEUKOCYTES, NITRITE, OR GLUCOSE <1000 mg/dL.  . ABO/RH(D) 09/24/2014 O POS  Final     X-Rays:Dg Pelvis Portable  10/05/2014   CLINICAL DATA:  Postoperative from left total hip joint replacement  EXAM: PORTABLE PELVIS 1-2 VIEWS  COMPARISON:  None.  FINDINGS: The patient has undergone left total hip joint prosthesis placement. The interface with the native bone is normal. No native bone fracture is demonstrated. The observed portions of the bony pelvis are unremarkable.  IMPRESSION: Status post left total hip joint replacement without evidence of immediate postprocedure complication.   Electronically Signed   By: David  Martinique M.D.   On: 10/05/2014 10:10   Dg Hip Operative Unilat With Pelvis Left  10/05/2014   CLINICAL DATA:  Left hip replacement.  EXAM: OPERATIVE left HIP (WITH PELVIS IF PERFORMED) 1 VIEW  TECHNIQUE: Fluoroscopic spot image(s) were submitted for interpretation post-operatively.  FLUOROSCOPY TIME:  Fluoroscopy Time:  0 minutes 22 seconds  Number of Acquired Images:  1  COMPARISON:  None.  FINDINGS: Left hip replacement with good anatomic alignment on one view. Hardware intact.  IMPRESSION: Left hip replacement with good anatomic alignment.   Electronically Signed   By: Marcello Moores  Register   On: 10/05/2014 09:01    EKG: Orders placed or performed in visit on 07/02/14  . EKG 12-Lead     Hospital Course: Patient was admitted to [facility] and taken to the OR and underwent the above state procedure without complications.  Patient tolerated the procedure well and was later transferred to the recovery room and then to the orthopaedic floor for postoperative care.  They were given PO and IV analgesics for pain control following their surgery.  They were given 24 hours of postoperative  antibiotics of  Anti-infectives    Start     Dose/Rate Route Frequency Ordered Stop   10/05/14 1900  vancomycin (VANCOCIN) IVPB 1000 mg/200 mL premix     1,000 mg 200 mL/hr over 60 Minutes Intravenous Every 12 hours 10/05/14 1046 10/05/14 2222   10/05/14 0530  vancomycin (VANCOCIN) 1,500 mg in sodium chloride 0.9 % 500 mL IVPB     1,500 mg 250 mL/hr over 120 Minutes Intravenous To [facility] 10/04/14 1301 10/05/14 0833     and started on DVT prophylaxis in the form of Xarelto.   PT and OT were ordered for total hip protocol.  The patient was allowed to be WBAT with therapy. Discharge planning was consulted to help with postop disposition and equipment needs.  Patient had a decent night on the evening of surgery.  They started to get up OOB with therapy on day one.  Hemovac drain was pulled without difficulty.  Patient was seen in rounds on day one and they wanted to go home.  Worked with therapy and was ready to go home.  Diet: Cardiac diet Activity:WBAT Follow-up:in 2 weeks Disposition - Home Discharged Condition: good      Medication List    STOP taking these medications        ALKA-SELTZER HEARTBURN PO     CINNAMON PO     FLAXSEED (LINSEED) PO     Krill Oil 500 MG Caps     multivitamin with minerals Tabs tablet     naproxen sodium 220 MG tablet  Commonly known as:  ANAPROX      TAKE these medications        cyclobenzaprine 5 MG tablet  Commonly known as:  FLEXERIL  Take 5-10 mg by mouth every 8 (eight) hours as needed. For muscle spasms  gabapentin 100 MG capsule  Commonly known as:  NEURONTIN  One pill every eight hours as needed; dose may be increased by one pill each dose after 72 hours if only partially effective     methocarbamol 500 MG tablet  Commonly known as:  ROBAXIN  Take 1 tablet (500 mg total) by mouth every 6 (six) hours as needed for muscle spasms.     oxyCODONE 5 MG immediate release tablet  Commonly known as:  Oxy IR/ROXICODONE    Take 1-2 tablets (5-10 mg total) by mouth every 3 (three) hours as needed for severe pain or breakthrough pain.     rivaroxaban 10 MG Tabs tablet  Commonly known as:  XARELTO  Take 1 tablet (10 mg total) by mouth daily with breakfast.     sodium chloride 0.65 % Soln nasal spray  Commonly known as:  OCEAN  Place 1 spray into both nostrils at bedtime.     traMADol 50 MG tablet  Commonly known as:  ULTRAM  Take 1-2 tablets (50-100 mg total) by mouth every 6 (six) hours as needed for moderate pain.           Follow-up Information    Follow up with [name], MD. Schedule an appointment as soon as possible for a visit on 10/18/2014.   Specialty:  Orthopedic Surgery   Why:  Call [phone] Tuesday to make the appointment   Contact information:   [address] [place] [address] [phone]       Follow up with [facility].   Why:  Home Health Physical Therapy arranged.   Contact information:   [address] [place] [address] [phone]       Signed: Arlee Muslim, PA-C Orthopaedic Surgery 10/18/2014, 8:56 AM

## 2015-02-15 ENCOUNTER — Telehealth: Payer: Self-pay | Admitting: Internal Medicine

## 2015-02-15 NOTE — Telephone Encounter (Signed)
Sorry but I am too full

## 2015-02-15 NOTE — Telephone Encounter (Signed)
Pt wife saw dr fry on 11/26/14, I inform the pt wife dr fry is not accepting any new or transferred pt. Can I sch?

## 2015-02-20 NOTE — Telephone Encounter (Signed)
Pt wife is aware

## 2015-07-24 ENCOUNTER — Encounter: Payer: Self-pay | Admitting: Internal Medicine

## 2015-07-24 ENCOUNTER — Other Ambulatory Visit (INDEPENDENT_AMBULATORY_CARE_PROVIDER_SITE_OTHER): Payer: Medicare Other

## 2015-07-24 ENCOUNTER — Ambulatory Visit (INDEPENDENT_AMBULATORY_CARE_PROVIDER_SITE_OTHER): Payer: Medicare Other | Admitting: Internal Medicine

## 2015-07-24 VITALS — BP 138/78 | HR 74 | Temp 98.3°F | Resp 16 | Ht 69.0 in | Wt 243.0 lb

## 2015-07-24 DIAGNOSIS — E785 Hyperlipidemia, unspecified: Secondary | ICD-10-CM

## 2015-07-24 DIAGNOSIS — R739 Hyperglycemia, unspecified: Secondary | ICD-10-CM | POA: Diagnosis not present

## 2015-07-24 DIAGNOSIS — Z23 Encounter for immunization: Secondary | ICD-10-CM | POA: Diagnosis not present

## 2015-07-24 DIAGNOSIS — R002 Palpitations: Secondary | ICD-10-CM | POA: Diagnosis not present

## 2015-07-24 DIAGNOSIS — Z1159 Encounter for screening for other viral diseases: Secondary | ICD-10-CM

## 2015-07-24 DIAGNOSIS — N138 Other obstructive and reflux uropathy: Secondary | ICD-10-CM

## 2015-07-24 DIAGNOSIS — N401 Enlarged prostate with lower urinary tract symptoms: Secondary | ICD-10-CM

## 2015-07-24 DIAGNOSIS — Z0000 Encounter for general adult medical examination without abnormal findings: Secondary | ICD-10-CM | POA: Diagnosis not present

## 2015-07-24 LAB — CBC WITH DIFFERENTIAL/PLATELET
Basophils Absolute: 0 10*3/uL (ref 0.0–0.1)
Basophils Relative: 0.5 % (ref 0.0–3.0)
Eosinophils Absolute: 0.1 10*3/uL (ref 0.0–0.7)
Eosinophils Relative: 1.6 % (ref 0.0–5.0)
HCT: 49.2 % (ref 39.0–52.0)
Hemoglobin: 16.9 g/dL (ref 13.0–17.0)
Lymphocytes Relative: 21.1 % (ref 12.0–46.0)
Lymphs Abs: 1.5 10*3/uL (ref 0.7–4.0)
MCHC: 34.3 g/dL (ref 30.0–36.0)
MCV: 89.8 fl (ref 78.0–100.0)
Monocytes Absolute: 0.6 10*3/uL (ref 0.1–1.0)
Monocytes Relative: 8 % (ref 3.0–12.0)
Neutro Abs: 4.8 10*3/uL (ref 1.4–7.7)
Neutrophils Relative %: 68.8 % (ref 43.0–77.0)
Platelets: 203 10*3/uL (ref 150.0–400.0)
RBC: 5.48 Mil/uL (ref 4.22–5.81)
RDW: 14.1 % (ref 11.5–15.5)
WBC: 6.9 10*3/uL (ref 4.0–10.5)

## 2015-07-24 LAB — LIPID PANEL
Cholesterol: 246 mg/dL — ABNORMAL HIGH (ref 0–200)
HDL: 40.3 mg/dL (ref >39.00)
NonHDL: 206.17
Total CHOL/HDL Ratio: 6
Triglycerides: 211 mg/dL — ABNORMAL HIGH (ref 0.0–149.0)
VLDL: 42.2 mg/dL — ABNORMAL HIGH (ref 0.0–40.0)

## 2015-07-24 LAB — URINALYSIS, ROUTINE W REFLEX MICROSCOPIC
Bilirubin Urine: NEGATIVE
Hgb urine dipstick: NEGATIVE
Ketones, ur: NEGATIVE
Leukocytes, UA: NEGATIVE
Nitrite: NEGATIVE
RBC / HPF: NONE SEEN (ref 0–?)
Specific Gravity, Urine: 1.02 (ref 1.000–1.030)
Total Protein, Urine: NEGATIVE
Urine Glucose: NEGATIVE
Urobilinogen, UA: 0.2 (ref 0.0–1.0)
WBC, UA: NONE SEEN (ref 0–?)
pH: 5.5 (ref 5.0–8.0)

## 2015-07-24 LAB — COMPREHENSIVE METABOLIC PANEL
ALT: 30 U/L (ref 0–53)
AST: 24 U/L (ref 0–37)
Albumin: 4.3 g/dL (ref 3.5–5.2)
Alkaline Phosphatase: 60 U/L (ref 39–117)
BUN: 15 mg/dL (ref 6–23)
CO2: 28 mEq/L (ref 19–32)
Calcium: 9.5 mg/dL (ref 8.4–10.5)
Chloride: 106 mEq/L (ref 96–112)
Creatinine, Ser: 1.07 mg/dL (ref 0.40–1.50)
GFR: 73.45 mL/min (ref >60.00)
Glucose, Bld: 147 mg/dL — ABNORMAL HIGH (ref 70–99)
Potassium: 4.8 mEq/L (ref 3.5–5.1)
Sodium: 141 mEq/L (ref 135–145)
Total Bilirubin: 0.5 mg/dL (ref 0.2–1.2)
Total Protein: 6.8 g/dL (ref 6.0–8.3)

## 2015-07-24 LAB — TSH: TSH: 1.62 u[IU]/mL (ref 0.35–4.50)

## 2015-07-24 LAB — LDL CHOLESTEROL, DIRECT: Direct LDL: 177 mg/dL

## 2015-07-24 LAB — PSA: PSA: 0.82 ng/mL (ref 0.10–4.00)

## 2015-07-24 LAB — FECAL OCCULT BLOOD, GUAIAC: Fecal Occult Blood: NEGATIVE

## 2015-07-24 LAB — HEMOGLOBIN A1C: Hgb A1c MFr Bld: 6.5 % (ref 4.6–6.5)

## 2015-07-24 NOTE — Patient Instructions (Signed)

## 2015-07-24 NOTE — Progress Notes (Signed)
Subjective:  Patient ID: James [name], male    DOB: Feb 15, 1951  Age: 65 y.o. MRN: [id number]  CC: Annual Exam and Palpitations   HPI James [name] presents for a CPX.  He complains of intermittent elevated heart rate for several months. He is starting on a walking program and he notices on his heart rate monitor that he wears that his heart rate will intermittently spike up to 120-140. He has no symptoms with this such as chest pain, dizziness, shortness of breath, syncope, or dyspnea on exertion. He does not experience any edema either. He does not have any symptoms of elevated heart rate when he is resting.  He also complains of asymptomatic lumps on his left chest wall that is been there for about 5 or 6 years. They do not cause any symptoms such as swelling or pain.  Outpatient Prescriptions Prior to Visit  Medication Sig Dispense Refill  . traMADol (ULTRAM) 50 MG tablet Take 1-2 tablets (50-100 mg total) by mouth every 6 (six) hours as needed for moderate pain. 90 tablet 1  . methocarbamol (ROBAXIN) 500 MG tablet Take 1 tablet (500 mg total) by mouth every 6 (six) hours as needed for muscle spasms. 80 tablet 1  . sodium chloride (OCEAN) 0.65 % SOLN nasal spray Place 1 spray into both nostrils at bedtime.    . cyclobenzaprine (FLEXERIL) 5 MG tablet Take 5-10 mg by mouth every 8 (eight) hours as needed. For muscle spasms    . gabapentin (NEURONTIN) 100 MG capsule One pill every eight hours as needed; dose may be increased by one pill each dose after 72 hours if only partially effective 30 capsule 2  . oxyCODONE (OXY IR/ROXICODONE) 5 MG immediate release tablet Take 1-2 tablets (5-10 mg total) by mouth every 3 (three) hours as needed for severe pain or breakthrough pain. 80 tablet 0  . rivaroxaban (XARELTO) 10 MG TABS tablet Take 1 tablet (10 mg total) by mouth daily with breakfast. (Patient not taking: Reported on 07/24/2015) 19 tablet 0   No facility-administered medications prior to  visit.    ROS Review of Systems  Constitutional: Positive for unexpected weight change (weight gain). Negative for fever, chills, diaphoresis, appetite change and fatigue.  HENT: Negative.   Eyes: Negative.  Negative for visual disturbance.  Respiratory: Positive for apnea. Negative for cough, choking, chest tightness, shortness of breath, wheezing and stridor.   Cardiovascular: Positive for palpitations. Negative for chest pain and leg swelling.  Gastrointestinal: Negative.  Negative for nausea, vomiting, abdominal pain, diarrhea, constipation and blood in stool.  Endocrine: Negative.  Negative for polydipsia, polyphagia and polyuria.  Genitourinary: Negative.  Negative for dysuria, urgency, frequency, flank pain, decreased urine volume, scrotal swelling, difficulty urinating and testicular pain.  Musculoskeletal: Negative.   Skin: Negative.   Allergic/Immunologic: Negative.   Neurological: Negative.  Negative for dizziness, syncope, speech difficulty, weakness, light-headedness, numbness and headaches.  Hematological: Negative.  Negative for adenopathy. Does not bruise/bleed easily.  Psychiatric/Behavioral: Negative.     Objective:  BP 138/78 mmHg  Pulse 74  Temp(Src) 98.3 F (36.8 C) (Oral)  Resp 16  Ht '5\' 9"'$  (1.753 m)  Wt 243 lb (110.224 kg)  BMI 35.87 kg/m2  SpO2 97%  BP Readings from Last 3 Encounters:  07/24/15 138/78  10/06/14 135/70  09/26/14 122/70    Wt Readings from Last 3 Encounters:  07/24/15 243 lb (110.224 kg)  10/05/14 242 lb (109.77 kg)  09/26/14 242 lb 8 oz (109.997 kg)  Physical Exam  Constitutional: He is oriented to person, place, and time. No distress.  HENT:  Head: Normocephalic and atraumatic.  Mouth/Throat: Oropharynx is clear and moist. No oropharyngeal exudate.  Eyes: Conjunctivae are normal. Right eye exhibits no discharge. Left eye exhibits no discharge. No scleral icterus.  Neck: Normal range of motion. Neck supple. No JVD present. No  tracheal deviation present. No thyromegaly present.  Cardiovascular: Normal rate, regular rhythm, normal heart sounds and intact distal pulses.  Exam reveals no gallop and no friction rub.   No murmur heard. EKG -  Sinus  Rhythm  WITHIN NORMAL LIMITS  Pulmonary/Chest: Effort normal and breath sounds normal. No stridor. No respiratory distress. He has no wheezes. He has no rales. He exhibits mass. He exhibits no tenderness and no bony tenderness. Right breast exhibits no inverted nipple, no mass, no nipple discharge, no skin change and no tenderness. Left breast exhibits mass. Left breast exhibits no inverted nipple, no nipple discharge, no skin change and no tenderness. Breasts are symmetrical.    Abdominal: Soft. Bowel sounds are normal. He exhibits no distension and no mass. There is no tenderness. There is no rebound and no guarding. Hernia confirmed negative in the right inguinal area and confirmed negative in the left inguinal area.  Genitourinary: Testes normal and penis normal. Rectal exam shows external hemorrhoid. Rectal exam shows no internal hemorrhoid, no fissure, no mass, no tenderness and anal tone normal. Guaiac negative stool. Prostate is enlarged (1+ smooth symm BPH). Prostate is not tender. Right testis shows no mass, no swelling and no tenderness. Right testis is descended. Left testis shows no mass, no swelling and no tenderness. Left testis is descended. Circumcised. No penile erythema or penile tenderness. No discharge found.  Musculoskeletal: Normal range of motion. He exhibits no edema or tenderness.  Lymphadenopathy:    He has no cervical adenopathy.       Right: No inguinal adenopathy present.       Left: No inguinal adenopathy present.  Neurological: He is oriented to person, place, and time.  Skin: Skin is warm and dry. No rash noted. He is not diaphoretic. No erythema. No pallor.  Psychiatric: He has a normal mood and affect. His behavior is normal. Judgment and  thought content normal.  Vitals reviewed.   Lab Results  Component Value Date   WBC 6.9 07/24/2015   HGB 16.9 07/24/2015   HCT 49.2 07/24/2015   PLT 203.0 07/24/2015   GLUCOSE 147* 07/24/2015   CHOL 246* 07/24/2015   TRIG 211.0* 07/24/2015   HDL 40.30 07/24/2015   LDLDIRECT 177.0 07/24/2015   LDLCALC 172* 03/12/2014   ALT 30 07/24/2015   AST 24 07/24/2015   NA 141 07/24/2015   K 4.8 07/24/2015   CL 106 07/24/2015   CREATININE 1.07 07/24/2015   BUN 15 07/24/2015   CO2 28 07/24/2015   TSH 1.62 07/24/2015   PSA 0.82 07/24/2015   INR 0.96 09/24/2014   HGBA1C 6.5 07/24/2015    No results found.  Assessment & Plan:   Jayke was seen today for annual exam and palpitations.  Diagnoses and all orders for this visit:  BPH with obstruction/lower urinary tract symptoms- His exam is unremarkable, his PSA is normal and the urinalysis is negative, minimal symptoms that he does have he does not want to treat with a medication such as an alpha blocker. -     Urinalysis, Routine w reflex microscopic (not at [facility]); Future -     PSA;  Future  Hyperglycemia- his A1c is up to 6.5%, he has new onset type 2 diabetes mellitus, no medications are indicated at this time, he will continue to work on his lifestyle modifications. -     Comprehensive metabolic panel; Future -     Hemoglobin A1c; Future  Hyperlipidemia with target LDL less than 130- his LDL is too high, he has developed type 2 diabetes mellitus, therefore asked him to start a high-dose statin for primary risk reduction -     Lipid panel; Future -     Comprehensive metabolic panel; Future -     CBC with Differential/Platelet; Future -     TSH; Future  Need for hepatitis C screening test -     Hepatitis C antibody; Future  Palpitations- his EKG is normal, the palpitations sound benign, I have ordered a cardiac event monitor to identify any serious dysrhythmia such his a flutter, SVT, atrial fibrillation. -     Cardiac event  monitor; Future -     EKG 12-Lead  Need for 23-polyvalent pneumococcal polysaccharide vaccine -     Pneumococcal polysaccharide vaccine 23-valent greater than or equal to 2yo subcutaneous/IM  Routine health maintenance   I have discontinued Mr. Coble cyclobenzaprine, sodium chloride, gabapentin, methocarbamol, rivaroxaban, and oxyCODONE. I am also having him maintain his traMADol.  No orders of the defined types were placed in this encounter.   See AVS for instructions about healthy living and anticipatory guidance.  Follow-up: Return in about 4 months (around 11/23/2015).  [name], MD

## 2015-07-24 NOTE — Progress Notes (Signed)
Pre visit review using our clinic review tool, if applicable. No additional management support is needed unless otherwise documented below in the visit note. 

## 2015-07-25 ENCOUNTER — Encounter: Payer: Self-pay | Admitting: Internal Medicine

## 2015-07-25 LAB — HEPATITIS C ANTIBODY: HCV Ab: NEGATIVE

## 2015-07-28 MED ORDER — ATORVASTATIN CALCIUM 80 MG PO TABS: 80.0000 mg | ORAL_TABLET | Freq: Every day | ORAL | Status: DC

## 2015-07-28 NOTE — Assessment & Plan Note (Signed)

## 2015-08-14 ENCOUNTER — Ambulatory Visit (INDEPENDENT_AMBULATORY_CARE_PROVIDER_SITE_OTHER): Payer: Medicare Other

## 2015-08-14 DIAGNOSIS — R002 Palpitations: Secondary | ICD-10-CM

## 2015-09-22 ENCOUNTER — Encounter: Payer: Self-pay | Admitting: Internal Medicine

## 2016-06-04 ENCOUNTER — Other Ambulatory Visit: Payer: Self-pay | Admitting: Nurse Practitioner

## 2016-06-04 DIAGNOSIS — Z20828 Contact with and (suspected) exposure to other viral communicable diseases: Secondary | ICD-10-CM

## 2016-06-04 MED ORDER — OSELTAMIVIR PHOSPHATE 75 MG PO CAPS: 75.0000 mg | ORAL_CAPSULE | Freq: Two times a day (BID) | ORAL | 0 refills | Status: DC

## 2016-06-13 ENCOUNTER — Encounter: Payer: Self-pay | Admitting: Family Medicine

## 2016-06-13 ENCOUNTER — Ambulatory Visit (INDEPENDENT_AMBULATORY_CARE_PROVIDER_SITE_OTHER): Payer: Medicare Other | Admitting: Family Medicine

## 2016-06-13 VITALS — BP 140/80 | HR 67 | Temp 98.8°F | Resp 20 | Wt 234.0 lb

## 2016-06-13 DIAGNOSIS — R011 Cardiac murmur, unspecified: Secondary | ICD-10-CM | POA: Diagnosis not present

## 2016-06-13 DIAGNOSIS — B9689 Other specified bacterial agents as the cause of diseases classified elsewhere: Secondary | ICD-10-CM | POA: Diagnosis not present

## 2016-06-13 DIAGNOSIS — J019 Acute sinusitis, unspecified: Secondary | ICD-10-CM | POA: Diagnosis not present

## 2016-06-13 MED ORDER — DOXYCYCLINE HYCLATE 100 MG PO TABS: 100.0000 mg | ORAL_TABLET | Freq: Two times a day (BID) | ORAL | 0 refills | Status: DC

## 2016-06-13 NOTE — Assessment & Plan Note (Addendum)
New holosystolic murmur, not previously appreciated, pt denies h/o murmur.  No red flags of current fevers, chills, night sweats, chest pain, weight loss, etc. I did not check ESR/CBC as he current has sinusitis as well.  Discussed with patient - recommend close f/u with PCP in 2 wks after treatment for sinusitis, reviewed reasons to seek further urgent care including new fever >101, chills, night sweats, or malaise.  Will cc PCP as well.

## 2016-06-13 NOTE — Assessment & Plan Note (Addendum)
Left frontal/maxillary sinusitis after initial influenza treated with tamiflu. Treat with doxycycline antibiotic course. Continue aleve with meals.  Further supportive care reviewed.  Return if not improving with treatment.

## 2016-06-13 NOTE — Progress Notes (Signed)
BP 140/80   Pulse 67   Temp 98.8 F (37.1 C) (Oral)   Resp 20   Wt 234 lb (106.1 kg)   SpO2 98%   BMI 34.56 kg/m    CC: URI sxs Subjective:    Patient ID: James [name], male    DOB: [birth year], 66 y.o.   MRN: [id number]  HPI: James [name] is a 66 y.o. male presenting on 06/13/2016 for Lymphadenopathy; Cough; Nasal Congestion; and Sore Throat   Wife with flu last week - she is feeling better. Pt also did have flu last week, and was treated with tamiflu 5d regimen as well. Last dose was Monday evening.   Did feel better, but with persistent sore throat, left earache, congestion. When he awoke this morning noted L throat pain from swollen glands. Some axillary fullness as well. Nasal congestion and PNdrainage and sinus pressure. + left toothache. Cough is improving.   Treating cough with robitussin and aleve.  No current chest pain, dyspnea or dizziness.   Uses CPAP at night time - needing nasal saline to help breath..  Non smoker  No asthma history.   H/o myeloproliferative neoplasm 2000.  Recent 30d event monitor - normal.   Relevant past medical, surgical, family and social history reviewed and updated as indicated. Interim medical history since our last visit reviewed. Allergies and medications reviewed and updated. Current Outpatient Prescriptions on File Prior to Visit  Medication Sig  . atorvastatin (LIPITOR) 80 MG tablet Take 1 tablet (80 mg total) by mouth daily.  . traMADol (ULTRAM) 50 MG tablet Take 1-2 tablets (50-100 mg total) by mouth every 6 (six) hours as needed for moderate pain.   No current facility-administered medications on file prior to visit.     Review of Systems Per HPI unless specifically indicated in ROS section     Objective:    BP 140/80   Pulse 67   Temp 98.8 F (37.1 C) (Oral)   Resp 20   Wt 234 lb (106.1 kg)   SpO2 98%   BMI 34.56 kg/m   Wt Readings from Last 3 Encounters:  06/13/16 234 lb (106.1 kg)  07/24/15 243 lb (110.2  kg)  10/05/14 242 lb (109.8 kg)    Physical Exam  Constitutional: He appears well-developed and well-nourished. No distress.  HENT:  Head: Normocephalic and atraumatic.  Right Ear: Hearing, tympanic membrane, external ear and ear canal normal.  Left Ear: Hearing, external ear and ear canal normal.  Nose: Mucosal edema present. No rhinorrhea. Right sinus exhibits no maxillary sinus tenderness and no frontal sinus tenderness. Left sinus exhibits maxillary sinus tenderness and frontal sinus tenderness.  Mouth/Throat: Uvula is midline, oropharynx is clear and moist and mucous membranes are normal. No oropharyngeal exudate, posterior oropharyngeal edema, posterior oropharyngeal erythema or tonsillar abscesses.  Congestion and mild erythema behind L TM Erythematous nasal mucosa with irritation R>L  Eyes: Conjunctivae and EOM are normal. Pupils are equal, round, and reactive to light. No scleral icterus.  Neck: Normal range of motion. Neck supple.  Cardiovascular: Normal rate, regular rhythm and intact distal pulses.   Murmur (3/6 holosystolic) heard. Pulmonary/Chest: Effort normal and breath sounds normal. No respiratory distress. He has no wheezes. He has no rales.  Lymphadenopathy:    He has cervical adenopathy (tender bilat AC LAD).    He has no axillary adenopathy.       Right: No supraclavicular adenopathy present.       Left: No supraclavicular adenopathy present.  No axillary adenopathy appreciated  Skin: Skin is warm and dry. No rash noted.  Nursing note and vitals reviewed.  Lab Results  Component Value Date   CREATININE 1.07 07/24/2015       Assessment & Plan:   Problem List Items Addressed This Visit    Acute bacterial sinusitis - Primary    Left frontal/maxillary sinusitis after initial influenza treated with tamiflu. Treat with doxycycline antibiotic course. Continue aleve with meals.  Further supportive care reviewed.  Return if not improving with treatment.        Relevant Medications   doxycycline (VIBRA-TABS) 100 MG tablet   Systolic murmur    New holosystolic murmur, not previously appreciated, pt denies h/o murmur.  No red flags of current fevers, chills, night sweats, chest pain, weight loss, etc. I did not check ESR/CBC as he current has sinusitis as well.  Discussed with patient - recommend close f/u with PCP in 2 wks after treatment for sinusitis, reviewed reasons to seek further urgent care including new fever >101, chills, night sweats, or malaise.  Will cc PCP as well.           Follow up plan: Return in about 2 weeks (around 06/27/2016), or if symptoms worsen or fail to improve, for follow up visit.  Ria Bush, MD

## 2016-06-13 NOTE — Progress Notes (Signed)
Pre visit review using our clinic review tool, if applicable. No additional management support is needed unless otherwise documented below in the visit note. 

## 2016-06-13 NOTE — Patient Instructions (Addendum)
I think you have acute sinus infection. Treat with doxycycline antibiotic twice daily with food. Avoid sun while on antibiotic.  Push fluids and plenty of rest. May use aleve as needed for headache and sinus inflammation.  I heard a murmur today - return in 2 weeks for a recheck of murmur and swollen glands.  Good to meet you today, call us with questions.

## 2016-06-29 ENCOUNTER — Other Ambulatory Visit (INDEPENDENT_AMBULATORY_CARE_PROVIDER_SITE_OTHER): Payer: Medicare Other

## 2016-06-29 ENCOUNTER — Ambulatory Visit (INDEPENDENT_AMBULATORY_CARE_PROVIDER_SITE_OTHER): Payer: Medicare Other | Admitting: Internal Medicine

## 2016-06-29 ENCOUNTER — Encounter: Payer: Self-pay | Admitting: Internal Medicine

## 2016-06-29 VITALS — BP 146/79 | HR 72 | Temp 98.8°F | Resp 16 | Ht 69.0 in | Wt 236.5 lb

## 2016-06-29 DIAGNOSIS — R011 Cardiac murmur, unspecified: Secondary | ICD-10-CM

## 2016-06-29 DIAGNOSIS — E118 Type 2 diabetes mellitus with unspecified complications: Secondary | ICD-10-CM

## 2016-06-29 DIAGNOSIS — E785 Hyperlipidemia, unspecified: Secondary | ICD-10-CM

## 2016-06-29 DIAGNOSIS — R739 Hyperglycemia, unspecified: Secondary | ICD-10-CM

## 2016-06-29 LAB — C-REACTIVE PROTEIN: CRP: 0.4 mg/dL — ABNORMAL LOW (ref 0.5–20.0)

## 2016-06-29 LAB — CBC WITH DIFFERENTIAL/PLATELET
Basophils Absolute: 0.1 10*3/uL (ref 0.0–0.1)
Basophils Relative: 0.8 % (ref 0.0–3.0)
Eosinophils Absolute: 0.1 10*3/uL (ref 0.0–0.7)
Eosinophils Relative: 2.2 % (ref 0.0–5.0)
HCT: 46.8 % (ref 39.0–52.0)
Hemoglobin: 15.7 g/dL (ref 13.0–17.0)
Lymphocytes Relative: 26.4 % (ref 12.0–46.0)
Lymphs Abs: 1.7 10*3/uL (ref 0.7–4.0)
MCHC: 33.6 g/dL (ref 30.0–36.0)
MCV: 91.7 fl (ref 78.0–100.0)
Monocytes Absolute: 0.6 10*3/uL (ref 0.1–1.0)
Monocytes Relative: 10.1 % (ref 3.0–12.0)
Neutro Abs: 3.8 10*3/uL (ref 1.4–7.7)
Neutrophils Relative %: 60.5 % (ref 43.0–77.0)
Platelets: 182 10*3/uL (ref 150.0–400.0)
RBC: 5.11 Mil/uL (ref 4.22–5.81)
RDW: 14.3 % (ref 11.5–15.5)
WBC: 6.3 10*3/uL (ref 4.0–10.5)

## 2016-06-29 LAB — HEMOGLOBIN A1C: Hgb A1c MFr Bld: 6.4 % (ref 4.6–6.5)

## 2016-06-29 LAB — COMPREHENSIVE METABOLIC PANEL
ALT: 27 U/L (ref 0–53)
AST: 21 U/L (ref 0–37)
Albumin: 4.3 g/dL (ref 3.5–5.2)
Alkaline Phosphatase: 53 U/L (ref 39–117)
BUN: 15 mg/dL (ref 6–23)
CO2: 28 mEq/L (ref 19–32)
Calcium: 9.4 mg/dL (ref 8.4–10.5)
Chloride: 108 mEq/L (ref 96–112)
Creatinine, Ser: 1.06 mg/dL (ref 0.40–1.50)
GFR: 74.04 mL/min (ref >60.00)
Glucose, Bld: 109 mg/dL — ABNORMAL HIGH (ref 70–99)
Potassium: 4.6 mEq/L (ref 3.5–5.1)
Sodium: 141 mEq/L (ref 135–145)
Total Bilirubin: 0.5 mg/dL (ref 0.2–1.2)
Total Protein: 6.9 g/dL (ref 6.0–8.3)

## 2016-06-29 LAB — LIPID PANEL
Cholesterol: 245 mg/dL — ABNORMAL HIGH (ref 0–200)
HDL: 35.9 mg/dL — ABNORMAL LOW (ref >39.00)
LDL Cholesterol: 170 mg/dL — ABNORMAL HIGH (ref 0–99)
NonHDL: 209.16
Total CHOL/HDL Ratio: 7
Triglycerides: 198 mg/dL — ABNORMAL HIGH (ref 0.0–149.0)
VLDL: 39.6 mg/dL (ref 0.0–40.0)

## 2016-06-29 LAB — URINALYSIS, ROUTINE W REFLEX MICROSCOPIC
Bilirubin Urine: NEGATIVE
Hgb urine dipstick: NEGATIVE
Ketones, ur: NEGATIVE
Leukocytes, UA: NEGATIVE
Nitrite: NEGATIVE
RBC / HPF: NONE SEEN (ref 0–?)
Specific Gravity, Urine: 1.02 (ref 1.000–1.030)
Total Protein, Urine: NEGATIVE
Urine Glucose: NEGATIVE
Urobilinogen, UA: 0.2 (ref 0.0–1.0)
WBC, UA: NONE SEEN (ref 0–?)
pH: 5.5 (ref 5.0–8.0)

## 2016-06-29 LAB — MICROALBUMIN / CREATININE URINE RATIO
Creatinine,U: 91.1 mg/dL
Microalb Creat Ratio: 0.8 mg/g (ref 0.0–30.0)
Microalb, Ur: <0.7 mg/dL (ref 0.0–1.9)

## 2016-06-29 LAB — SEDIMENTATION RATE: Sed Rate: 6 mm/hr (ref 0–20)

## 2016-06-29 MED ORDER — ATORVASTATIN CALCIUM 80 MG PO TABS: 80.0000 mg | ORAL_TABLET | Freq: Every day | ORAL | 3 refills | Status: DC

## 2016-06-29 NOTE — Progress Notes (Signed)
Subjective:  Patient ID: James [name], male    DOB: 03-10-1951  Age: 66 y.o. MRN: [id number]  CC: Diabetes   HPI [name] presents for f/up. He was recently seen for an upper respiratory infection with lymphadenopathy and has completed a course of doxycycline. He tells me that all those symptoms have resolved. He does note over the last month or 2 that he has noticed some DOE. He denies chest pain, diaphoresis, palpitations, edema, or fatigue. During his recent visit he was found to have a new onset systolic murmur.  He has had not been taking the statin. He thinks his blood sugars are well-controlled as he has had no polys.  Outpatient Medications Prior to Visit  Medication Sig Dispense Refill  . atorvastatin (LIPITOR) 80 MG tablet Take 1 tablet (80 mg total) by mouth daily. 90 tablet 3  . doxycycline (VIBRA-TABS) 100 MG tablet Take 1 tablet (100 mg total) by mouth 2 (two) times daily. 20 tablet 0  . traMADol (ULTRAM) 50 MG tablet Take 1-2 tablets (50-100 mg total) by mouth every 6 (six) hours as needed for moderate pain. (Patient not taking: Reported on 06/29/2016) 90 tablet 1   No facility-administered medications prior to visit.     ROS Review of Systems  Constitutional: Negative for appetite change, chills, diaphoresis, fatigue and fever.  HENT: Negative.  Negative for sore throat and trouble swallowing.   Eyes: Negative for visual disturbance.  Respiratory: Positive for shortness of breath (DOE). Negative for choking, chest tightness, wheezing and stridor.   Cardiovascular: Negative for chest pain, palpitations and leg swelling.  Gastrointestinal: Negative for abdominal pain, constipation, diarrhea, nausea and vomiting.  Endocrine: Negative for polydipsia, polyphagia and polyuria.  Genitourinary: Negative.  Negative for difficulty urinating, dysuria, frequency and hematuria.  Musculoskeletal: Negative for back pain, myalgias and neck pain.  Skin: Negative.  Negative for  color change, pallor and rash.  Neurological: Negative.  Negative for dizziness, weakness, numbness and headaches.  Hematological: Negative.  Negative for adenopathy. Does not bruise/bleed easily.  Psychiatric/Behavioral: Negative.     Objective:  BP (!) 146/79 (BP Location: Left Arm, Patient Position: Sitting, Cuff Size: Normal)   Pulse 72   Temp 98.8 F (37.1 C) (Oral)   Resp 16   Ht '5\' 9"'$  (1.753 m)   Wt 236 lb 8 oz (107.3 kg)   SpO2 97%   BMI 34.92 kg/m   BP Readings from Last 3 Encounters:  06/29/16 (!) 146/79  06/13/16 140/80  07/24/15 138/78    Wt Readings from Last 3 Encounters:  06/29/16 236 lb 8 oz (107.3 kg)  06/13/16 234 lb (106.1 kg)  07/24/15 243 lb (110.2 kg)    Physical Exam  Constitutional: He is oriented to person, place, and time. No distress.  HENT:  Mouth/Throat: Oropharynx is clear and moist. No oropharyngeal exudate.  Eyes: Conjunctivae are normal. Right eye exhibits no discharge. Left eye exhibits no discharge. No scleral icterus.  Neck: Normal range of motion. Neck supple. No JVD present. No tracheal deviation present. No thyromegaly present.  Cardiovascular: Normal rate, regular rhythm, S1 normal and S2 normal.  Exam reveals no gallop.   Murmur heard.  Systolic murmur is present with a grade of 3/6   No diastolic murmur is present  3/6 holosystolic murmur heard everywhere  EKG ---  Sinus  Rhythm  WITHIN NORMAL LIMITS  Pulmonary/Chest: Effort normal and breath sounds normal. No stridor. No respiratory distress. He has no wheezes. He has no  rales. He exhibits no tenderness.  Abdominal: Soft. Bowel sounds are normal. He exhibits no distension and no mass. There is no tenderness. There is no rebound and no guarding.  Musculoskeletal: Normal range of motion. He exhibits no edema, tenderness or deformity.  Lymphadenopathy:    He has no cervical adenopathy.  Neurological: He is oriented to person, place, and time.  Skin: Skin is warm and dry. No  rash noted. He is not diaphoretic. No erythema. No pallor.  Vitals reviewed.   Lab Results  Component Value Date   WBC 6.3 06/29/2016   HGB 15.7 06/29/2016   HCT 46.8 06/29/2016   PLT 182.0 06/29/2016   GLUCOSE 109 (H) 06/29/2016   CHOL 245 (H) 06/29/2016   TRIG 198.0 (H) 06/29/2016   HDL 35.90 (L) 06/29/2016   LDLDIRECT 177.0 07/24/2015   LDLCALC 170 (H) 06/29/2016   ALT 27 06/29/2016   AST 21 06/29/2016   NA 141 06/29/2016   K 4.6 06/29/2016   CL 108 06/29/2016   CREATININE 1.06 06/29/2016   BUN 15 06/29/2016   CO2 28 06/29/2016   TSH 1.62 07/24/2015   PSA 0.82 07/24/2015   INR 0.96 09/24/2014   HGBA1C 6.4 06/29/2016   MICROALBUR <0.7 06/29/2016    No results found.  Assessment & Plan:   Zan was seen today for diabetes.  Diagnoses and all orders for this visit:  Hyperlipidemia with target LDL less than 130- he has not achieved his LDL goal due to noncompliance. I've asked him to restart atorvastatin. -     Lipid panel; Future -     atorvastatin (LIPITOR) 80 MG tablet; Take 1 tablet (80 mg total) by mouth daily.  Type 2 diabetes mellitus with complication, without long-term current use of insulin ([place])- with an A1c of 6.4% his blood sugars are adequately well controlled. -     Comprehensive metabolic panel; Future -     Hemoglobin A1c; Future -     Urinalysis, Routine w reflex microscopic; Future -     Microalbumin / creatinine urine ratio; Future -     atorvastatin (LIPITOR) 80 MG tablet; Take 1 tablet (80 mg total) by mouth daily.  Systolic murmur- he has new onset murmur with DOE and a normal EKG, I think this is either aortic stenosis or mitral regurgitation. I've ordered an echocardiogram to confirm the presence or absence of valvular heart disease. -     CBC with Differential/Platelet; Future -     Sedimentation rate; Future -     C-reactive protein; Future -     Cancel: Ambulatory referral to Cardiology -     ECHOCARDIOGRAM COMPLETE; Future -     EKG  12-Lead  Hyperglycemia   I have discontinued Mr. Majid traMADol and doxycycline. I am also having him maintain his Fish Oil, multivitamin, and atorvastatin.  Meds ordered this encounter  Medications  . Omega-3 Fatty Acids (FISH OIL) 500 MG CAPS    Sig: Take 500 mg by mouth.  . Multiple Vitamin (MULTIVITAMIN) tablet    Sig: Take 1 tablet by mouth daily.  Marland Kitchen atorvastatin (LIPITOR) 80 MG tablet    Sig: Take 1 tablet (80 mg total) by mouth daily.    Dispense:  90 tablet    Refill:  3     Follow-up: Return in about 4 weeks (around 07/27/2016).  [name], MD

## 2016-06-29 NOTE — Patient Instructions (Signed)
Heart Murmur A heart murmur is an extra sound heard by your health care provider when listening to your heart with a device called a stethoscope. The sound comes from turbulence when blood flows through the heart and may be a "hum" or "whoosh" sound heard when the heart beats. There are two types of heart murmurs:  Innocent murmurs. Most people with this type of heart murmur do not have a heart problem. Many children have innocent heart murmurs. Your health care provider may suggest some basic testing to know whether your murmur is an innocent murmur. If an innocent heart murmur is found, there is no need for further tests or treatment and no need to restrict activities or stop playing sports.  Abnormal murmurs. These types of murmurs can occur in children and adults. In children, abnormal heart murmurs are typically caused from heart defects that are present at birth (congenital). In adults, abnormal murmurs are usually from heart valve problems caused by disease, infection, or aging. CAUSES  Normally, these valves open to let blood flow through or out of your heart and then shut to keep it from flowing backward. If they do not work properly, you could have:  Regurgitation-When blood leaks back through the valve in the wrong direction.  Mitral valve prolapse-When the mitral valve of the heart has a loose flap and does not close tightly.  Stenosis-When the valve does not open enough and blocks blood flow. SIGNS AND SYMPTOMS  Innocent murmurs do not cause symptoms, and many people with abnormal murmurs may or may not have symptoms. If symptoms do develop, they may include:  Shortness of breath.  Blue coloring of the skin, especially on the fingertips.  Chest pain.  Palpitations, or feeling a fluttering or skipped heartbeat.  Fainting.  Persistent cough.  Getting tired much faster than expected. DIAGNOSIS  A heart murmur might be heard during a sports physical or during any type of  examination. When a murmur is heard, it may suggest a possible problem. When this happens, your health care provider may ask you to see a heart specialist (cardiologist). You may also be asked to have one or more heart tests. In these cases, testing may vary depending on what your health care provider heard. Tests for a heart murmur may include:  Electrocardiogram.  Echocardiogram.  MRI. For children and adults who have an abnormal heart murmur and want to play sports, it is important to complete testing, review test results, and receive recommendations from your health care provider. If heart disease is present, it may not be safe to play. TREATMENT  Innocent murmurs require no treatment or activity restriction. If an abnormal murmur represents a problem with the heart, treatment will depend on the exact nature of the problem. In these cases, medicine or surgery may be needed to treat the problem. HOME CARE INSTRUCTIONS If you want to participate in sports or other types of strenuous physical activity, it is important to discuss this first with your health care provider. If the murmur represents a problem with the heart and you choose to participate in sports, there is a small chance that a serious problem (including sudden death) could result.  SEEK MEDICAL CARE IF:   You feel that your symptoms are slowly worsening.  You develop any new symptoms that cause concern.  You feel that you are having side effects from any medicines prescribed. SEEK IMMEDIATE MEDICAL CARE IF:   You develop chest pain.  You have shortness of breath.  You notice that your heart beats irregularly often enough to cause you to worry.  You have fainting spells.  Your symptoms suddenly get worse. This information is not intended to replace advice given to you by your health care provider. Make sure you discuss any questions you have with your health care provider. Document Released: 06/04/2004 Document Revised:  05/18/2014 Document Reviewed: 01/02/2013 Elsevier Interactive Patient Education  2017 [employer].

## 2016-06-29 NOTE — Progress Notes (Signed)
Pre visit review using our clinic review tool, if applicable. No additional management support is needed unless otherwise documented below in the visit note. 

## 2016-07-01 ENCOUNTER — Ambulatory Visit (HOSPITAL_COMMUNITY): Payer: Medicare Other | Attending: Internal Medicine

## 2016-07-01 ENCOUNTER — Other Ambulatory Visit: Payer: Self-pay

## 2016-07-01 DIAGNOSIS — R011 Cardiac murmur, unspecified: Secondary | ICD-10-CM | POA: Insufficient documentation

## 2016-07-01 DIAGNOSIS — I071 Rheumatic tricuspid insufficiency: Secondary | ICD-10-CM | POA: Insufficient documentation

## 2016-07-02 ENCOUNTER — Encounter: Payer: Self-pay | Admitting: Internal Medicine

## 2016-07-02 ENCOUNTER — Other Ambulatory Visit: Payer: Self-pay | Admitting: Internal Medicine

## 2016-07-02 DIAGNOSIS — I340 Nonrheumatic mitral (valve) insufficiency: Secondary | ICD-10-CM | POA: Insufficient documentation

## 2016-07-09 ENCOUNTER — Encounter: Payer: Self-pay | Admitting: Cardiology

## 2016-07-12 NOTE — Progress Notes (Signed)
Cardiology Office Note    Date:  07/15/2016   ID:  BROCKTON MCKESSON, DOB 03/08/1951, MRN [id number]  PCP:  [name], MD  Cardiologist:  Shammond Arave Martinique, MD    History of Present Illness:  MALEK SKOG is a 66 y.o. male referred by Dr. Ronnald Ramp for evaluation of dyspnea and a heart murmur. History of DM type 2 on oral therapy and HLD. He states he was in good health until he recently had the flu. He was seen by his physician and a heart murmur was noted. He states he has never had a heart murmur before. He considers himself to be in good health. He walks daily at least 3 miles/day. Also works out on [employer]. Notes some dyspnea sometimes when he first starts but once he gets going he feels fine. No chest pain, edema, palpitations, dizziness, orthopnea, or PND.   Past Medical History:  Diagnosis Date  . Arthritis    "knees, hips, back" (10/19/2012)  . Chronic lower back pain   . Colon polyps    10/27/2002, repeat letter 09/17/2007  . Depressive disorder, not elsewhere classified   . Diabetes mellitus without complication ([place])   . Fasting hyperglycemia   . GERD (gastroesophageal reflux disease)   . Hemoptysis    abnormal CT Chest 01/29/10 - ? new GG changes RUL > not viz on plain cxr 02/26/2010  . MPN (myeloproliferative neoplasm) ([place])    1st detected 06/04/1998  . Obesity   . OSA on CPAP   . Other and unspecified hyperlipidemia   . Positive PPD 1965   "non reactive in 2012" (10/19/2012)  . Routine general medical examination at a health care facility   . Special screening for malignant neoplasm of prostate   . Spinal stenosis, unspecified region other than cervical   . Wrist pain, left     Past Surgical History:  Procedure Laterality Date  . ANTERIOR CRUCIATE LIGAMENT REPAIR Left 1967  . CARDIAC CATHETERIZATION  2000  . CHEST TUBE INSERTION Right 10/19/2012   post bronch  . COLONOSCOPY W/ POLYPECTOMY    . FLEXIBLE BRONCHOSCOPY  10/19/2012   Flexible video fiberoptic  bronchoscopy with electromagnetic navigation and biopsies.  . TONSILLECTOMY  1950's  . TOTAL HIP ARTHROPLASTY Left 10/05/2014   dr [name]  . TOTAL HIP ARTHROPLASTY Left 10/05/2014   Procedure: LEFT TOTAL HIP ARTHROPLASTY ANTERIOR APPROACH;  Surgeon: [name], MD;  Location: [facility];  Service: Orthopedics;  Laterality: Left;  Marland Kitchen VIDEO BRONCHOSCOPY WITH ENDOBRONCHIAL NAVIGATION N/A 10/19/2012   Procedure: VIDEO BRONCHOSCOPY WITH ENDOBRONCHIAL NAVIGATION;  Surgeon: [name], MD;  Location: [facility];  Service: Thoracic;  Laterality: N/A;  . WRIST RECONSTRUCTION  12/2009   'proximal row carpectomy" [name]    Current Medications: Outpatient Medications Prior to Visit  Medication Sig Dispense Refill  . Multiple Vitamin (MULTIVITAMIN) tablet Take 1 tablet by mouth daily.    . Omega-3 Fatty Acids (FISH OIL) 500 MG CAPS Take 500 mg by mouth.    Marland Kitchen atorvastatin (LIPITOR) 80 MG tablet Take 1 tablet (80 mg total) by mouth daily. (Patient not taking: Reported on 07/15/2016) 90 tablet 3   No facility-administered medications prior to visit.      Allergies:   Amoxicillin   Social History   Social History  . Marital status: Married    Spouse name: N/A  . Number of children: 3  . Years of education: N/A   Occupational History  . Retired, Financial risk analyst   .  Retired, Real estate     Slow   Social History Main Topics  . Smoking status: Never Smoker  . Smokeless tobacco: Never Used  . Alcohol use 0.6 oz/week    1 Cans of beer per week  . Drug use: No  . Sexual activity: No   Other Topics Concern  . None   Social History Narrative   HSG, [employer]. Married '73.  2 sons - '74,   '76;  1 daughter -  '77  6 grandchildren.   Work - IT consultant now retired. ACP - not fully discussed.                     Family History:  The patient's family history includes Heart attack in his father; Heart disease in his father and paternal uncle.   ROS:   Please see  the history of present illness.    ROS All other systems reviewed and are negative.   PHYSICAL EXAM:   VS:  BP (!) 150/70   Pulse 72   Ht '5\' 9"'$  (1.753 m)   Wt 233 lb (105.7 kg)   BMI 34.41 kg/m    GEN: Well nourished, well developed, in no acute distress  HEENT: normal  Neck: no JVD, carotid bruits, or masses Cardiac: RRR; there is a loud gr 3/6 harsh systolic murmur heard best at the LSB and apex radiating into the left axilla. No diastolic murmur or gallop. No edema.  Respiratory:  clear to auscultation bilaterally, normal work of breathing GI: soft, nontender, nondistended, + BS MS: no deformity or atrophy  Skin: warm and dry, no rash Neuro:  Alert and Oriented x 3, Strength and sensation are intact Psych: euthymic mood, full affect  Wt Readings from Last 3 Encounters:  07/15/16 233 lb (105.7 kg)  06/29/16 236 lb 8 oz (107.3 kg)  06/13/16 234 lb (106.1 kg)      Studies/Labs Reviewed:   EKG:  EKG is not ordered today.  The ekg ordered 06/29/16 demonstrates NSR with normal Ecg.   Recent Labs: 07/24/2015: TSH 1.62 06/29/2016: ALT 27; BUN 15; Creatinine, Ser 1.06; Hemoglobin 15.7; Platelets 182.0; Potassium 4.6; Sodium 141   Lipid Panel    Component Value Date/Time   CHOL 245 (H) 06/29/2016 1153   TRIG 198.0 (H) 06/29/2016 1153   TRIG 108 05/20/2006 0825   HDL 35.90 (L) 06/29/2016 1153   CHOLHDL 7 06/29/2016 1153   VLDL 39.6 06/29/2016 1153   LDLCALC 170 (H) 06/29/2016 1153   LDLDIRECT 177.0 07/24/2015 1052    Additional studies/ records that were reviewed today include:   Event monitor 08/14/15: Study Highlights   NSR  Average HR 64 bpm No significant arrhythmias No AV block No pauses      Echo: 07/01/16: Study Conclusions  - Left ventricle: The cavity size was mildly dilated. There was   mild concentric hypertrophy. Systolic function was normal. The   estimated ejection fraction was in the range of 60% to 65%. Wall   motion was normal; there were no  regional wall motion   abnormalities. Left ventricular diastolic function parameters   were normal. - Aortic valve: Transvalvular velocity was within the normal range.   There was no stenosis. There was no regurgitation. - Mitral valve: Transvalvular velocity was within the normal range.   There was no evidence for stenosis. There was mild regurgitation. - Left atrium: The atrium was moderately dilated. - Right ventricle: The cavity size was normal.  Wall thickness was   normal. Systolic function was normal. - Atrial septum: No defect or patent foramen ovale was identified   by color flow Doppler. - Tricuspid valve: There was trivial regurgitation. - Pulmonary arteries: Systolic pressure was within the normal   range. PA peak pressure: 24 mm Hg (S).  ASSESSMENT:    1. Non-rheumatic mitral regurgitation   2. Edema, unspecified type      PLAN:  In order of problems listed above:  1. Mr. Beg murmur is discordant with the transthoracic Echo report. I personally reviewed his Echo. In some apical views it appears that he has a partial flail posterior leaflet. While there is some MR I think the Echo underestimates the severity. I think he probably has a ruptured chord. He is minimally symptomatic. Supporting the diagnosis of more significant MR is the fact that he has some LV and LA enlargement. I have recommended a TEE to more accurately assess the cause and severity of his MR so we can adequately assess his prognosis and appropriate follow up and treatment. The procedure and risks of TEE were explained in detail. He is agreeable to proceed.  2. Mixed hyperlipidemia. Now on high dose statin therapy.    Medication Adjustments/Labs and Tests Ordered: Current medicines are reviewed at length with the patient today.  Concerns regarding medicines are outlined above.  Medication changes, Labs and Tests ordered today are listed in the Patient Instructions below. Patient Instructions    Schedule TEE    Signed, Darvell Monteforte Martinique, MD  07/15/2016 4:58 PM    [facility] [address], [place], [place], [address] [phone]

## 2016-07-15 ENCOUNTER — Other Ambulatory Visit: Payer: Self-pay | Admitting: Cardiology

## 2016-07-15 ENCOUNTER — Ambulatory Visit (INDEPENDENT_AMBULATORY_CARE_PROVIDER_SITE_OTHER): Payer: Medicare Other | Admitting: Cardiology

## 2016-07-15 ENCOUNTER — Encounter: Payer: Self-pay | Admitting: Cardiology

## 2016-07-15 VITALS — BP 150/70 | HR 72 | Ht 69.0 in | Wt 233.0 lb

## 2016-07-15 DIAGNOSIS — I340 Nonrheumatic mitral (valve) insufficiency: Secondary | ICD-10-CM

## 2016-07-15 DIAGNOSIS — R609 Edema, unspecified: Secondary | ICD-10-CM | POA: Diagnosis not present

## 2016-07-15 NOTE — Progress Notes (Signed)
3

## 2016-07-15 NOTE — Patient Instructions (Signed)
Schedule TEE

## 2016-07-17 ENCOUNTER — Encounter (HOSPITAL_COMMUNITY): Payer: Self-pay | Admitting: Internal Medicine

## 2016-07-17 ENCOUNTER — Encounter (HOSPITAL_COMMUNITY)
Admission: RE | Disposition: A | Discharge status: Home / Self Care | Payer: Self-pay | Source: Ambulatory Visit | Attending: Internal Medicine

## 2016-07-17 ENCOUNTER — Ambulatory Visit (HOSPITAL_BASED_OUTPATIENT_CLINIC_OR_DEPARTMENT_OTHER): Payer: Medicare Other

## 2016-07-17 ENCOUNTER — Ambulatory Visit (HOSPITAL_COMMUNITY)
Admission: RE | Admit: 2016-07-17 | Discharge: 2016-07-17 | Disposition: A | Discharge status: Home / Self Care | Payer: Medicare Other | Source: Ambulatory Visit | Attending: Internal Medicine | Admitting: Internal Medicine

## 2016-07-17 DIAGNOSIS — E785 Hyperlipidemia, unspecified: Secondary | ICD-10-CM | POA: Insufficient documentation

## 2016-07-17 DIAGNOSIS — R011 Cardiac murmur, unspecified: Secondary | ICD-10-CM | POA: Insufficient documentation

## 2016-07-17 DIAGNOSIS — Z79899 Other long term (current) drug therapy: Secondary | ICD-10-CM | POA: Diagnosis not present

## 2016-07-17 DIAGNOSIS — I341 Nonrheumatic mitral (valve) prolapse: Secondary | ICD-10-CM | POA: Diagnosis not present

## 2016-07-17 DIAGNOSIS — I340 Nonrheumatic mitral (valve) insufficiency: Secondary | ICD-10-CM | POA: Diagnosis present

## 2016-07-17 DIAGNOSIS — E119 Type 2 diabetes mellitus without complications: Secondary | ICD-10-CM | POA: Insufficient documentation

## 2016-07-17 HISTORY — PX: TEE WITHOUT CARDIOVERSION: SHX5443

## 2016-07-17 SURGERY — ECHOCARDIOGRAM, TRANSESOPHAGEAL
Anesthesia: Moderate Sedation

## 2016-07-17 MED ORDER — DIPHENHYDRAMINE HCL 50 MG/ML IJ SOLN
INTRAMUSCULAR | Status: AC
  Filled 2016-07-17: qty 1

## 2016-07-17 MED ORDER — FENTANYL CITRATE (PF) 100 MCG/2ML IJ SOLN
INTRAMUSCULAR | Status: DC | PRN
  Administered 2016-07-17: 12.5 ug via INTRAVENOUS
  Administered 2016-07-17 (×2): 25 ug via INTRAVENOUS

## 2016-07-17 MED ORDER — LIDOCAINE VISCOUS 2 % MT SOLN
OROMUCOSAL | Status: DC | PRN
  Administered 2016-07-17: 10 mL via OROMUCOSAL

## 2016-07-17 MED ORDER — MIDAZOLAM HCL 10 MG/2ML IJ SOLN
INTRAMUSCULAR | Status: DC | PRN
  Administered 2016-07-17: 2 mg via INTRAVENOUS
  Administered 2016-07-17: 1 mg via INTRAVENOUS
  Administered 2016-07-17: 2 mg via INTRAVENOUS

## 2016-07-17 MED ORDER — FENTANYL CITRATE (PF) 100 MCG/2ML IJ SOLN
INTRAMUSCULAR | Status: AC
  Filled 2016-07-17: qty 2

## 2016-07-17 MED ORDER — BUTAMBEN-TETRACAINE-BENZOCAINE 2-2-14 % EX AERO
INHALATION_SPRAY | CUTANEOUS | Status: DC | PRN
  Administered 2016-07-17: 2 via TOPICAL

## 2016-07-17 MED ORDER — MIDAZOLAM HCL 5 MG/ML IJ SOLN
INTRAMUSCULAR | Status: AC
  Filled 2016-07-17: qty 2

## 2016-07-17 MED ORDER — SODIUM CHLORIDE 0.9 % IV SOLN: INTRAVENOUS | Status: DC

## 2016-07-17 MED ORDER — LIDOCAINE VISCOUS 2 % MT SOLN
OROMUCOSAL | Status: AC
  Filled 2016-07-17: qty 15

## 2016-07-17 NOTE — Discharge Instructions (Signed)

## 2016-07-17 NOTE — CV Procedure (Addendum)
TRANSESOPHAGEAL ECHOCARDIOGRAM (TEE) NOTE  INDICATIONS: mitral regurgitation  PROCEDURE:   Informed consent was obtained prior to the procedure. The risks, benefits and alternatives for the procedure were discussed and the patient comprehended these risks.  Risks include, but are not limited to, cough, sore throat, vomiting, nausea, somnolence, esophageal and stomach trauma or perforation, bleeding, low blood pressure, aspiration, pneumonia, infection, trauma to the teeth and death.    After a procedural time-out, the patient was given 5 mg versed and 62.5 mcg fentanyl for moderate sedation.  The patient's heart rate, blood pressure, and oxygen saturation are monitored continuously during the procedure.The oropharynx was anesthetized 10 cc of topical 1% viscous lidocaine and 2 cetacaine sprays.  The transesophageal probe was inserted in the esophagus and stomach without difficulty and multiple views were obtained.  The patient was kept under observation until the patient left the procedure room.  The period of conscious sedation is 19 minutes, of which I was present face-to-face 100% of this time. The patient left the procedure room in stable condition.   Agitated microbubble saline contrast was administered.  COMPLICATIONS:    There were no immediate complications.  Findings:  1. LEFT VENTRICLE: The left ventricular wall thickness is mildly increased.  The left ventricular cavity is normal in size. Wall motion is normal.  LVEF is 60-65%.  2. RIGHT VENTRICLE:  The right ventricle is normal in structure and function without any thrombus or masses.    3. LEFT ATRIUM:  The left atrium is mildly dilated in size without any thrombus or masses.  There is not spontaneous echo contrast ("smoke") in the left atrium consistent with a low flow state.  4. LEFT ATRIAL APPENDAGE:  The left atrial appendage is free of any thrombus or masses. The appendage has single lobes. Pulse doppler indicates  high flow in the appendage. There is mitral regurgitation which swirls into the left atrial appendage.  5. ATRIAL SEPTUM:  The atrial septum appears intact and is free of thrombus and/or masses.  There is no evidence for interatrial shunting by color doppler and saline microbubble.  6. RIGHT ATRIUM:  The right atrium is normal in size and function without any thrombus or masses.  7. MITRAL VALVE:  The mitral valve is well-visualized with 2D/3D echo. There is a notable flail P3 segment with prolapsing cord - there is posteriorly directed Severe regurgitation. ERO of 33 mm2 - PISA radius of 0.9 cm. There were no vegetations or stenosis.  8. AORTIC VALVE:  The aortic valve is trileaflet, normal in structure and function with no regurgitation.  There were no vegetations or stenosis  9. TRICUSPID VALVE:  The tricuspid valve is normal in structure and function with trivial regurgitation.  There were no vegetations or stenosis  10.  PULMONIC VALVE:  The pulmonic valve is normal in structure and function with Mild regurgitation.  There were no vegetations or stenosis.   11. AORTIC ARCH, ASCENDING AND DESCENDING AORTA:  There no Ron Parker et. Al, 1992) atherosclerosis of the ascending aorta, aortic arch, or proximal descending aorta.  12. PULMONARY VEINS: Anomalous pulmonary venous return was not noted.  13. PERICARDIUM: The pericardium appeared normal and non-thickened.  There is no pericardial effusion.  IMPRESSION:   1. Flail P3 segment with cord prolapse and posteriorly directed severe mitral regurgitation. 2. Mild LAE 3. No LAA thrombus - MR swirls in the left atrial appendage. 4. Negative for PFO by color doppler and saline microbubble contrast 5. LVEF 60-65%  RECOMMENDATIONS:    1. 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 Surgical evaluation is recommended.  Time Spent Directly with the Patient:  30 minutes    [name], MD, [facility] Attending Cardiologist [facility]  07/17/2016, 11:07 AM

## 2016-07-17 NOTE — H&P (Signed)
    INTERVAL PROCEDURE H&P  History and Physical Interval Note:  07/17/2016 9:43 AM  James [name] has presented today for their planned procedure. The various methods of treatment have been discussed with the patient and family. After consideration of risks, benefits and other options for treatment, the patient has consented to the procedure.  The patients' outpatient history has been reviewed, patient examined, and no change in status from most recent office note within the past 30 days. I have reviewed the patients' chart and labs and will proceed as planned. Questions were answered to the patient's satisfaction.   [name], MD, [facility] Attending Cardiologist [facility]erg[facility] 07/17/2016, 9:43 AM

## 2016-07-19 ENCOUNTER — Encounter (HOSPITAL_COMMUNITY): Payer: Self-pay | Admitting: Internal Medicine

## 2016-07-27 ENCOUNTER — Other Ambulatory Visit (INDEPENDENT_AMBULATORY_CARE_PROVIDER_SITE_OTHER): Payer: Medicare Other

## 2016-07-27 ENCOUNTER — Ambulatory Visit (INDEPENDENT_AMBULATORY_CARE_PROVIDER_SITE_OTHER): Payer: Medicare Other | Admitting: Internal Medicine

## 2016-07-27 ENCOUNTER — Encounter: Payer: Self-pay | Admitting: Internal Medicine

## 2016-07-27 VITALS — BP 150/80 | HR 72 | Temp 98.2°F | Resp 16 | Ht 69.0 in | Wt 234.0 lb

## 2016-07-27 DIAGNOSIS — E118 Type 2 diabetes mellitus with unspecified complications: Secondary | ICD-10-CM

## 2016-07-27 DIAGNOSIS — N138 Other obstructive and reflux uropathy: Secondary | ICD-10-CM

## 2016-07-27 DIAGNOSIS — E785 Hyperlipidemia, unspecified: Secondary | ICD-10-CM

## 2016-07-27 DIAGNOSIS — N401 Enlarged prostate with lower urinary tract symptoms: Secondary | ICD-10-CM

## 2016-07-27 DIAGNOSIS — R590 Localized enlarged lymph nodes: Secondary | ICD-10-CM

## 2016-07-27 DIAGNOSIS — E291 Testicular hypofunction: Secondary | ICD-10-CM

## 2016-07-27 DIAGNOSIS — Z0000 Encounter for general adult medical examination without abnormal findings: Secondary | ICD-10-CM | POA: Diagnosis not present

## 2016-07-27 DIAGNOSIS — R918 Other nonspecific abnormal finding of lung field: Secondary | ICD-10-CM | POA: Diagnosis not present

## 2016-07-27 LAB — TSH: TSH: 2.14 u[IU]/mL (ref 0.35–4.50)

## 2016-07-27 LAB — PSA: PSA: 0.83 ng/mL (ref 0.10–4.00)

## 2016-07-27 MED ORDER — ASPIRIN EC 81 MG PO TBEC: 81.0000 mg | DELAYED_RELEASE_TABLET | Freq: Every day | ORAL | 3 refills | Status: DC

## 2016-07-27 MED ORDER — ATORVASTATIN CALCIUM 20 MG PO TABS: 20.0000 mg | ORAL_TABLET | Freq: Every day | ORAL | 3 refills | Status: DC

## 2016-07-27 NOTE — Progress Notes (Signed)
Pre visit review using our clinic review tool, if applicable. No additional management support is needed unless otherwise documented below in the visit note. 

## 2016-07-27 NOTE — Progress Notes (Signed)
Subjective:  Patient ID: James [name], male    DOB: October 19, 1950  Age: 66 y.o. MRN: [id number]  CC: Hypertension; Hyperlipidemia; Diabetes; and Annual Exam   HPI James [name] presents for a CPX.  He recently underwent a TEE and it appears that he has a flail mitral valve and may need to undergo surgical repair. He has an appointment with cardiology in 3 days to discuss this. He has not been taking a statin for the elevated LDL and Framingham risk score of 25%. He previously took Lipitor at 80 mg a day and says it caused symptoms so the highest dose of Lipitor he agrees to take at this time is 20 mg. He tells me his blood pressure has been well controlled and he has had no recent episodes of chest pain, shortness of breath, palpitations, edema, or syncope. He does complain of fatigue and wants to have his testosterone level checked.  He complains of a persistently enlarged and uncomfortable lymph node on the left side of his neck. He also has a history of a tumor that was previously found on a CT scan on the right upper lobe of his lung.  Past Medical History:  Diagnosis Date  . Arthritis    "knees, hips, back" (10/19/2012)  . Chronic lower back pain   . Colon polyps    10/27/2002, repeat letter 09/17/2007  . Depressive disorder, not elsewhere classified   . Diabetes mellitus without complication ([place])   . Fasting hyperglycemia   . GERD (gastroesophageal reflux disease)   . Hemoptysis    abnormal CT Chest 01/29/10 - ? new GG changes RUL > not viz on plain cxr 02/26/2010  . MPN (myeloproliferative neoplasm) ([place])    1st detected 06/04/1998  . Obesity   . OSA on CPAP   . Other and unspecified hyperlipidemia   . Positive PPD 1965   "non reactive in 2012" (10/19/2012)  . Routine general medical examination at a health care facility   . Special screening for malignant neoplasm of prostate   . Spinal stenosis, unspecified region other than cervical   . Wrist pain, left    Past Surgical  History:  Procedure Laterality Date  . ANTERIOR CRUCIATE LIGAMENT REPAIR Left 1967  . CARDIAC CATHETERIZATION  2000  . CHEST TUBE INSERTION Right 10/19/2012   post bronch  . COLONOSCOPY W/ POLYPECTOMY    . FLEXIBLE BRONCHOSCOPY  10/19/2012   Flexible video fiberoptic bronchoscopy with electromagnetic navigation and biopsies.  . TEE WITHOUT CARDIOVERSION N/A 07/17/2016   Procedure: TRANSESOPHAGEAL ECHOCARDIOGRAM (TEE);  Surgeon: [name], MD;  Location: [facility] ENDOSCOPY;  Service: Cardiovascular;  Laterality: N/A;  . TONSILLECTOMY  1950's  . TOTAL HIP ARTHROPLASTY Left 10/05/2014   dr [name]  . TOTAL HIP ARTHROPLASTY Left 10/05/2014   Procedure: LEFT TOTAL HIP ARTHROPLASTY ANTERIOR APPROACH;  Surgeon: [name], MD;  Location: [place];  Service: Orthopedics;  Laterality: Left;  Marland Kitchen VIDEO BRONCHOSCOPY WITH ENDOBRONCHIAL NAVIGATION N/A 10/19/2012   Procedure: VIDEO BRONCHOSCOPY WITH ENDOBRONCHIAL NAVIGATION;  Surgeon: [name], MD;  Location: [place];  Service: Thoracic;  Laterality: N/A;  . WRIST RECONSTRUCTION  12/2009   'proximal row carpectomy" [name]    reports that he has never smoked. He has never used smokeless tobacco. He reports that he drinks about 0.6 oz of alcohol per week . He reports that he does not use drugs. family history includes Heart attack in his father; Heart disease in his father and paternal uncle. Allergies  Allergen Reactions  . Amoxicillin Itching    Outpatient Medications Prior to Visit  Medication Sig Dispense Refill  . Multiple Vitamin (MULTIVITAMIN) tablet Take 1 tablet by mouth daily.    . Omega-3 Fatty Acids (FISH OIL) 500 MG CAPS Take 500 mg by mouth.     No facility-administered medications prior to visit.     ROS Review of Systems  Constitutional: Positive for fatigue and unexpected weight change (wt gain). Negative for activity change, appetite change and diaphoresis.  HENT: Negative.   Eyes: Negative for visual disturbance.  Respiratory:  Negative for cough, chest tightness, shortness of breath and wheezing.   Cardiovascular: Negative for chest pain, palpitations and leg swelling.  Gastrointestinal: Negative for abdominal pain, blood in stool, constipation, diarrhea, nausea and vomiting.  Endocrine: Negative.   Genitourinary: Negative.  Negative for difficulty urinating, dysuria, frequency, hematuria, penile pain, penile swelling, scrotal swelling and urgency.  Musculoskeletal: Negative.  Negative for back pain, joint swelling and myalgias.  Skin: Negative.   Allergic/Immunologic: Negative.   Neurological: Negative.  Negative for dizziness, weakness and numbness.  Hematological: Positive for adenopathy. Does not bruise/bleed easily.  Psychiatric/Behavioral: Negative.     Objective:  BP (!) 150/80 (BP Location: Left Arm, Patient Position: Sitting, Cuff Size: Normal)   Pulse 72   Temp 98.2 F (36.8 C) (Oral)   Resp 16   Ht '5\' 9"'$  (1.753 m)   Wt 234 lb (106.1 kg)   SpO2 96%   BMI 34.56 kg/m   BP Readings from Last 3 Encounters:  07/27/16 (!) 150/80  07/17/16 (!) 114/57  07/15/16 (!) 150/70    Wt Readings from Last 3 Encounters:  07/27/16 234 lb (106.1 kg)  07/15/16 233 lb (105.7 kg)  06/29/16 236 lb 8 oz (107.3 kg)    Physical Exam  Constitutional: He is oriented to person, place, and time. No distress.  HENT:  Mouth/Throat: Oropharynx is clear and moist. No oropharyngeal exudate.  Eyes: Conjunctivae are normal. Right eye exhibits no discharge. Left eye exhibits no discharge. No scleral icterus.  Neck: Normal range of motion. Neck supple. No JVD present. No tracheal deviation present. No thyromegaly present.  Cardiovascular: Normal rate, regular rhythm, S1 normal, S2 normal and intact distal pulses.  Exam reveals no gallop, no S3, no S4 and no friction rub.   Murmur heard.  Decrescendo systolic murmur is present with a grade of 2/6   No diastolic murmur is present  Pulmonary/Chest: Effort normal and breath  sounds normal. No stridor. No respiratory distress. He has no wheezes. He has no rales. He exhibits no tenderness.  Abdominal: Soft. Bowel sounds are normal. He exhibits no distension and no mass. There is no tenderness. There is no rebound and no guarding. Hernia confirmed negative in the right inguinal area and confirmed negative in the left inguinal area.  Genitourinary: Testes normal and penis normal. Rectal exam shows no external hemorrhoid, no internal hemorrhoid, no fissure, no mass, no tenderness, anal tone normal and guaiac negative stool. Prostate is enlarged (1+ smooth symm BPH). Prostate is not tender. Right testis shows no mass, no swelling and no tenderness. Right testis is descended. Left testis shows no mass, no swelling and no tenderness. Left testis is descended. Circumcised. No penile erythema or penile tenderness. No discharge found.  Musculoskeletal: Normal range of motion. He exhibits no edema, tenderness or deformity.  Lymphadenopathy:       Head (right side): No submental, no submandibular, no tonsillar, no preauricular, no posterior auricular and  no occipital adenopathy present.       Head (left side): No submental, no submandibular, no tonsillar, no preauricular, no posterior auricular and no occipital adenopathy present.    He has cervical adenopathy.       Right cervical: No superficial cervical, no deep cervical and no posterior cervical adenopathy present.      Left cervical: Superficial cervical adenopathy present. No deep cervical and no posterior cervical adenopathy present.    He has no axillary adenopathy.       Right axillary: No pectoral and no lateral adenopathy present.       Left axillary: No pectoral and no lateral adenopathy present.      Right: No inguinal, no supraclavicular and no epitrochlear adenopathy present.       Left: No inguinal, no supraclavicular and no epitrochlear adenopathy present.  There is an enlarged left anterior cervical lymph node that  measures 3 x 4 cm, it is mildly tender but not fixed and nonfluctuant  Neurological: He is oriented to person, place, and time.  Skin: Skin is warm. No rash noted. He is not diaphoretic. No erythema. No pallor.  Psychiatric: He has a normal mood and affect. His behavior is normal. Judgment and thought content normal.  Vitals reviewed.   Lab Results  Component Value Date   WBC 6.3 06/29/2016   HGB 15.7 06/29/2016   HCT 46.8 06/29/2016   PLT 182.0 06/29/2016   GLUCOSE 109 (H) 06/29/2016   CHOL 245 (H) 06/29/2016   TRIG 198.0 (H) 06/29/2016   HDL 35.90 (L) 06/29/2016   LDLDIRECT 177.0 07/24/2015   LDLCALC 170 (H) 06/29/2016   ALT 27 06/29/2016   AST 21 06/29/2016   NA 141 06/29/2016   K 4.6 06/29/2016   CL 108 06/29/2016   CREATININE 1.06 06/29/2016   BUN 15 06/29/2016   CO2 28 06/29/2016   TSH 1.62 07/24/2015   PSA 0.82 07/24/2015   INR 0.96 09/24/2014   HGBA1C 6.4 06/29/2016   MICROALBUR <0.7 06/29/2016    No results found.  Assessment & Plan:   Long was seen today for hypertension, hyperlipidemia, diabetes and annual exam.  Diagnoses and all orders for this visit:  BPH with obstruction/lower urinary tract symptoms- he has no symptoms that need to be treated and his PSA is not elevated so not concerned about prostate cancer. -     PSA; Future  Hyperlipidemia with target LDL less than 130- his Framingham risk score is 25% so Avastin to start taking a statin and aspirin for cardiovascular risk reduction. -     atorvastatin (LIPITOR) 20 MG tablet; Take 1 tablet (20 mg total) by mouth daily. -     aspirin EC 81 MG tablet; Take 1 tablet (81 mg total) by mouth daily. -     TSH; Future  Mass of upper lobe of right lung- I've asked him to undergo a follow-up CT scan to see if there has been a malignant transformation -     CT CHEST W CONTRAST; Future  LAD (lymphadenopathy) of left cervical region- his recent CBC and sedimentation rate were normal, I've asked him to  undergo CT scan to see if there is concern for the development of lymphoma -     CT Soft Tissue Neck W Contrast; Future  Hypogonadism male- his testosterone level is low, will defer treatment of this until his cardiac status is addressed -     Testosterone Total,Free,Bio, Males; Future  Type 2 diabetes mellitus with  complication, without long-term current use of insulin ([place])- his blood sugars are adequately well-controlled. -     Ambulatory referral to Ophthalmology   I am having Mr. Delauder start on atorvastatin and aspirin EC. I am also having him maintain his Fish Oil and multivitamin.  Meds ordered this encounter  Medications  . atorvastatin (LIPITOR) 20 MG tablet    Sig: Take 1 tablet (20 mg total) by mouth daily.    Dispense:  90 tablet    Refill:  3  . aspirin EC 81 MG tablet    Sig: Take 1 tablet (81 mg total) by mouth daily.    Dispense:  90 tablet    Refill:  3   See AVS for instructions about healthy living and anticipatory guidance.  Follow-up: Return in about 3 months (around 10/27/2016).  [name], MD

## 2016-07-27 NOTE — Patient Instructions (Signed)
 Health Maintenance, Male A healthy lifestyle and preventive care is important for your health and wellness. Ask your health care provider about what schedule of regular examinations is right for you. What should I know about weight and diet?  Eat a Healthy Diet  Eat plenty of vegetables, fruits, whole grains, low-fat dairy products, and lean protein.  Do not eat a lot of foods high in solid fats, added sugars, or salt. Maintain a Healthy Weight  Regular exercise can help you achieve or maintain a healthy weight. You should:  Do at least 150 minutes of exercise each week. The exercise should increase your heart rate and make you sweat (moderate-intensity exercise).  Do strength-training exercises at least twice a week. Watch Your Levels of Cholesterol and Blood Lipids  Have your blood tested for lipids and cholesterol every 5 years starting at 67 years of age. If you are at high risk for heart disease, you should start having your blood tested when you are 67 years old. You may need to have your cholesterol levels checked more often if:  Your lipid or cholesterol levels are high.  You are older than 67 years of age.  You are at high risk for heart disease. What should I know about cancer screening? Many types of cancers can be detected early and may often be prevented. Lung Cancer  You should be screened every year for lung cancer if:  You are a current smoker who has smoked for at least 30 years.  You are a former smoker who has quit within the past 15 years.  Talk to your health care provider about your screening options, when you should start screening, and how often you should be screened. Colorectal Cancer  Routine colorectal cancer screening usually begins at 67 years of age and should be repeated every 5-10 years until you are 67 years old. You may need to be screened more often if early forms of precancerous polyps or small growths are found. Your health care provider  may recommend screening at an earlier age if you have risk factors for colon cancer.  Your health care provider may recommend using home test kits to check for hidden blood in the stool.  A small camera at the end of a tube can be used to examine your colon (sigmoidoscopy or colonoscopy). This checks for the earliest forms of colorectal cancer. Prostate and Testicular Cancer  Depending on your age and overall health, your health care provider may do certain tests to screen for prostate and testicular cancer.  Talk to your health care provider about any symptoms or concerns you have about testicular or prostate cancer. Skin Cancer  Check your skin from head to toe regularly.  Tell your health care provider about any new moles or changes in moles, especially if:  There is a change in a mole's size, shape, or color.  You have a mole that is larger than a pencil eraser.  Always use sunscreen. Apply sunscreen liberally and repeat throughout the day.  Protect yourself by wearing long sleeves, pants, a wide-brimmed hat, and sunglasses when outside. What should I know about heart disease, diabetes, and high blood pressure?  If you are 18-39 years of age, have your blood pressure checked every 3-5 years. If you are 40 years of age or older, have your blood pressure checked every year. You should have your blood pressure measured twice-once when you are at a hospital or clinic, and once when you are not at   a hospital or clinic. Record the average of the two measurements. To check your blood pressure when you are not at a hospital or clinic, you can use:  An automated blood pressure machine at a pharmacy.  A home blood pressure monitor.  Talk to your health care provider about your target blood pressure.  If you are between 45-79 years old, ask your health care provider if you should take aspirin to prevent heart disease.  Have regular diabetes screenings by checking your fasting blood sugar  level.  If you are at a normal weight and have a low risk for diabetes, have this test once every three years after the age of 45.  If you are overweight and have a high risk for diabetes, consider being tested at a younger age or more often.  A one-time screening for abdominal aortic aneurysm (AAA) by ultrasound is recommended for men aged 65-75 years who are current or former smokers. What should I know about preventing infection? Hepatitis B  If you have a higher risk for hepatitis B, you should be screened for this virus. Talk with your health care provider to find out if you are at risk for hepatitis B infection. Hepatitis C  Blood testing is recommended for:  Everyone born from 1945 through 1965.  Anyone with known risk factors for hepatitis C. Sexually Transmitted Diseases (STDs)  You should be screened each year for STDs including gonorrhea and chlamydia if:  You are sexually active and are younger than 67 years of age.  You are older than 67 years of age and your health care provider tells you that you are at risk for this type of infection.  Your sexual activity has changed since you were last screened and you are at an increased risk for chlamydia or gonorrhea. Ask your health care provider if you are at risk.  Talk with your health care provider about whether you are at high risk of being infected with HIV. Your health care provider may recommend a prescription medicine to help prevent HIV infection. What else can I do?  Schedule regular health, dental, and eye exams.  Stay current with your vaccines (immunizations).  Do not use any tobacco products, such as cigarettes, chewing tobacco, and e-cigarettes. If you need help quitting, ask your health care provider.  Limit alcohol intake to no more than 2 drinks per day. One drink equals 12 ounces of beer, 5 ounces of wine, or 1 ounces of hard liquor.  Do not use street drugs.  Do not share needles.  Ask your health  care provider for help if you need support or information about quitting drugs.  Tell your health care provider if you often feel depressed.  Tell your health care provider if you have ever been abused or do not feel safe at home. This information is not intended to replace advice given to you by your health care provider. Make sure you discuss any questions you have with your health care provider. Document Released: 10/24/2007 Document Revised: 12/25/2015 Document Reviewed: 01/29/2015 Elsevier Interactive Patient Education  2017 [employer]  

## 2016-07-28 ENCOUNTER — Encounter: Payer: Self-pay | Admitting: Internal Medicine

## 2016-07-28 LAB — TESTOSTERONE TOTAL,FREE,BIO, MALES
Albumin: 4.3 g/dL (ref 3.6–5.1)
Sex Hormone Binding: 21 nmol/L — ABNORMAL LOW (ref 22–77)
Testosterone: 157 ng/dL — ABNORMAL LOW (ref 250–827)

## 2016-07-29 NOTE — Assessment & Plan Note (Signed)

## 2016-07-29 NOTE — Progress Notes (Signed)
Cardiology Office Note    Date:  07/31/2016   ID:  James [name], DOB 03-14-1951, MRN [id number]  PCP:  [name], MD  Cardiologist:  Peter Martinique, MD    History of Present Illness:  James [name] is a 66 y.o. male is seen for follow up post TEE evaluation of a  heart murmur secondary to MR. History of DM type 2 on oral therapy and HLD. He states he was in good health until he recently had the flu. He was seen by his physician and a heart murmur was noted. He states he has never had a heart murmur before. He considers himself to be in good health. He walks daily at least 3 miles/day. Also works out on [employer]. Notes some dyspnea sometimes when he first starts but once he gets going he feels fine. No chest pain, edema, palpitations, dizziness, orthopnea, or PND.   On his last visit he was noted to have a loud murmur of MR. Careful review of his Echo suggested a partially flail leaflet. MR was graded as mild but this was felt to be an underestimate. A TEE was performed with results noted below.  On follow up today he is very anxious. Does note more dyspnea when walking up stairs and starting exercise. Seen recently by primary care who noted an enlarged lymph node in his neck. CT planned next week.   Past Medical History:  Diagnosis Date  . Arthritis    "knees, hips, back" (10/19/2012)  . Chronic lower back pain   . Colon polyps    10/27/2002, repeat letter 09/17/2007  . Depressive disorder, not elsewhere classified   . Diabetes mellitus without complication ([place])   . Fasting hyperglycemia   . GERD (gastroesophageal reflux disease)   . Hemoptysis    abnormal CT Chest 01/29/10 - ? new GG changes RUL > not viz on plain cxr 02/26/2010  . MPN (myeloproliferative neoplasm) ([place])    1st detected 06/04/1998  . Obesity   . OSA on CPAP   . Other and unspecified hyperlipidemia   . Positive PPD 1965   "non reactive in 2012" (10/19/2012)  . Routine general medical examination at a health  care facility   . Special screening for malignant neoplasm of prostate   . Spinal stenosis, unspecified region other than cervical   . Wrist pain, left     Past Surgical History:  Procedure Laterality Date  . ANTERIOR CRUCIATE LIGAMENT REPAIR Left 1967  . CARDIAC CATHETERIZATION  2000  . CHEST TUBE INSERTION Right 10/19/2012   post bronch  . COLONOSCOPY W/ POLYPECTOMY    . FLEXIBLE BRONCHOSCOPY  10/19/2012   Flexible video fiberoptic bronchoscopy with electromagnetic navigation and biopsies.  . TEE WITHOUT CARDIOVERSION N/A 07/17/2016   Procedure: TRANSESOPHAGEAL ECHOCARDIOGRAM (TEE);  Surgeon: [name], MD;  Location: [facility] ENDOSCOPY;  Service: Cardiovascular;  Laterality: N/A;  . TONSILLECTOMY  1950's  . TOTAL HIP ARTHROPLASTY Left 10/05/2014   dr [name]  . TOTAL HIP ARTHROPLASTY Left 10/05/2014   Procedure: LEFT TOTAL HIP ARTHROPLASTY ANTERIOR APPROACH;  Surgeon: [name], MD;  Location: [place];  Service: Orthopedics;  Laterality: Left;  Marland Kitchen VIDEO BRONCHOSCOPY WITH ENDOBRONCHIAL NAVIGATION N/A 10/19/2012   Procedure: VIDEO BRONCHOSCOPY WITH ENDOBRONCHIAL NAVIGATION;  Surgeon: [name], MD;  Location: [place];  Service: Thoracic;  Laterality: N/A;  . WRIST RECONSTRUCTION  12/2009   'proximal row carpectomy" [name]    Current Medications: Outpatient Medications Prior to Visit  Medication  Sig Dispense Refill  . aspirin EC 81 MG tablet Take 1 tablet (81 mg total) by mouth daily. 90 tablet 3  . atorvastatin (LIPITOR) 20 MG tablet Take 1 tablet (20 mg total) by mouth daily. 90 tablet 3  . Multiple Vitamin (MULTIVITAMIN) tablet Take 1 tablet by mouth daily.    . Omega-3 Fatty Acids (FISH OIL) 500 MG CAPS Take 500 mg by mouth.     No facility-administered medications prior to visit.      Allergies:   Amoxicillin   Social History   Social History  . Marital status: Married    Spouse name: N/A  . Number of children: 3  . Years of education: N/A   Occupational History  .  Retired, Financial risk analyst   . Retired, Real estate     [employer]   Social History Main Topics  . Smoking status: Never Smoker  . Smokeless tobacco: Never Used  . Alcohol use 0.6 oz/week    1 Cans of beer per week  . Drug use: No  . Sexual activity: No   Other Topics Concern  . None   Social History Narrative   HSG, [employer]. Married '73.  2 sons - '74,   '76;  1 daughter -  '77  6 grandchildren.   Work - IT consultant now retired. ACP - not fully discussed.                     Family History:  The patient's family history includes Heart attack in his father; Heart disease in his father and paternal uncle.   ROS:   Please see the history of present illness.    ROS All other systems reviewed and are negative.   PHYSICAL EXAM:   VS:  BP (!) 146/78   Pulse 66   Ht '5\' 6"'$  (1.676 m)   Wt 234 lb (106.1 kg)   BMI 37.77 kg/m    GEN: Well nourished, well developed, in no acute distress  HEENT: normal  Neck: no JVD, carotid bruits, or masses Cardiac: RRR; there is a loud gr 3/6 harsh systolic murmur heard best at the LSB and apex radiating into the left axilla. No diastolic murmur or gallop. No edema.  Respiratory:  clear to auscultation bilaterally, normal work of breathing GI: soft, nontender, nondistended, + BS MS: no deformity or atrophy  Skin: warm and dry, no rash Neuro:  Alert and Oriented x 3, Strength and sensation are intact Psych: euthymic mood, full affect  Wt Readings from Last 3 Encounters:  07/31/16 234 lb (106.1 kg)  07/27/16 234 lb (106.1 kg)  07/15/16 233 lb (105.7 kg)      Studies/Labs Reviewed:   EKG:  EKG is not ordered today.    Recent Labs: 06/29/2016: ALT 27; BUN 15; Creatinine, Ser 1.06; Hemoglobin 15.7; Platelets 182.0; Potassium 4.6; Sodium 141 07/27/2016: TSH 2.14   Lipid Panel    Component Value Date/Time   CHOL 245 (H) 06/29/2016 1153   TRIG 198.0 (H) 06/29/2016 1153   TRIG 108 05/20/2006 0825   HDL 35.90  (L) 06/29/2016 1153   CHOLHDL 7 06/29/2016 1153   VLDL 39.6 06/29/2016 1153   LDLCALC 170 (H) 06/29/2016 1153   LDLDIRECT 177.0 07/24/2015 1052    Additional studies/ records that were reviewed today include:   Event monitor 08/14/15: Study Highlights   NSR  Average HR 64 bpm No significant arrhythmias No AV block No pauses  Echo: 07/01/16: Study Conclusions  - Left ventricle: The cavity size was mildly dilated. There was   mild concentric hypertrophy. Systolic function was normal. The   estimated ejection fraction was in the range of 60% to 65%. Wall   motion was normal; there were no regional wall motion   abnormalities. Left ventricular diastolic function parameters   were normal. - Aortic valve: Transvalvular velocity was within the normal range.   There was no stenosis. There was no regurgitation. - Mitral valve: Transvalvular velocity was within the normal range.   There was no evidence for stenosis. There was mild regurgitation. - Left atrium: The atrium was moderately dilated. - Right ventricle: The cavity size was normal. Wall thickness was   normal. Systolic function was normal. - Atrial septum: No defect or patent foramen ovale was identified   by color flow Doppler. - Tricuspid valve: There was trivial regurgitation. - Pulmonary arteries: Systolic pressure was within the normal   range. PA peak pressure: 24 mm Hg (S).  TEE 07/17/16: Study Conclusions  - Left ventricle: The cavity size was normal. There was mild   concentric hypertrophy. Systolic function was normal. The   estimated ejection fraction was in the range of 60% to 65%. Wall   motion was normal; there were no regional wall motion   abnormalities. - Aortic valve: No evidence of vegetation. - Mitral valve: There is a partially flail P3 segment due to   ruptured cord, seen prolapsing in both 2D and 3D images. There is   severe, posterolaterally directed mitral regurgitation which   swirls into  the left atrial appendage. - Left atrium: The atrium was dilated. - Right atrium: No evidence of thrombus in the atrial cavity or   appendage. - Atrial septum: No defect or patent foramen ovale was identified.  Impressions:  - Flail P3 segment of the mitral leaflet with a rupture cord and   severe, posterolaterally directed MR which swirls in the LAA.   LVEF 60-65%.  ASSESSMENT:    1. Non-rheumatic mitral regurgitation      PLAN:  In order of problems listed above:  1. Mr. Coldren murmur is discordant with the transthoracic Echo report. TEE confirmed a ruptured chord involving the P3 leaflet with severe MR.  He is mildly symptomatic. Supporting the diagnosis of more significant MR is the fact that he has some LV and LA enlargement. We discussed findings in detail today. We discussed potential treatment with MV repair. Given severity of MR I would favor repair sooner than later to reduce risk of long term sequela such as pulmonary HTN, CHF, Afib. Next step is to do a [facility] to assess hemodynamics and rule out CAD. Will proceed with this April 3. Following this will refer to Dr Roxy Manns. The procedure and risks were reviewed including but not limited to death, myocardial infarction, stroke, arrythmias, bleeding, transfusion, emergency surgery, dye allergy, or renal dysfunction. The patient voices understanding and is agreeable to proceed. 2. Enlarge lymph node note in neck. Follow up CT. 3. Mixed hyperlipidemia. Now on high dose statin therapy.    Medication Adjustments/Labs and Tests Ordered: Current medicines are reviewed at length with the patient today.  Concerns regarding medicines are outlined above.  Medication changes, Labs and Tests ordered today are listed in the Patient Instructions below. Patient Instructions  Rt and Lf Cardiac cath Tue 08/11/16  Follow instructions given    Follow up appointment with Dr.Jordan  Monday 08/24/16 at 10:40am.    Signed, Collier Salina  Martinique, MD    07/31/2016 11:46 AM    [facility] [address], [place], [place], [address] [phone]

## 2016-07-31 ENCOUNTER — Other Ambulatory Visit: Payer: Self-pay | Admitting: Cardiology

## 2016-07-31 ENCOUNTER — Encounter: Payer: Self-pay | Admitting: Cardiology

## 2016-07-31 ENCOUNTER — Ambulatory Visit (INDEPENDENT_AMBULATORY_CARE_PROVIDER_SITE_OTHER): Payer: Medicare Other | Admitting: Cardiology

## 2016-07-31 VITALS — BP 146/78 | HR 66 | Ht 66.0 in | Wt 234.0 lb

## 2016-07-31 DIAGNOSIS — I340 Nonrheumatic mitral (valve) insufficiency: Secondary | ICD-10-CM

## 2016-07-31 LAB — CBC WITH DIFFERENTIAL/PLATELET
Basophils Absolute: 0 cells/uL (ref 0–200)
Basophils Relative: 0 %
Eosinophils Absolute: 67 cells/uL (ref 15–500)
Eosinophils Relative: 1 %
HCT: 49.1 % (ref 38.5–50.0)
Hemoglobin: 16.7 g/dL (ref 13.2–17.1)
Lymphocytes Relative: 21 %
Lymphs Abs: 1407 cells/uL (ref 850–3900)
MCH: 31 pg (ref 27.0–33.0)
MCHC: 34 g/dL (ref 32.0–36.0)
MCV: 91.1 fL (ref 80.0–100.0)
MPV: 10.3 fL (ref 7.5–12.5)
Monocytes Absolute: 536 cells/uL (ref 200–950)
Monocytes Relative: 8 %
Neutro Abs: 4690 cells/uL (ref 1500–7800)
Neutrophils Relative %: 70 %
Platelets: 189 10*3/uL (ref 140–400)
RBC: 5.39 MIL/uL (ref 4.20–5.80)
RDW: 14.8 % (ref 11.0–15.0)
WBC: 6.7 10*3/uL (ref 3.8–10.8)

## 2016-07-31 LAB — BASIC METABOLIC PANEL
BUN: 18 mg/dL (ref 7–25)
CO2: 25 mmol/L (ref 20–31)
Calcium: 9.3 mg/dL (ref 8.6–10.3)
Chloride: 107 mmol/L (ref 98–110)
Creat: 1.15 mg/dL (ref 0.70–1.25)
Glucose, Bld: 108 mg/dL — ABNORMAL HIGH (ref 65–99)
Potassium: 4.7 mmol/L (ref 3.5–5.3)
Sodium: 142 mmol/L (ref 135–146)

## 2016-07-31 NOTE — Patient Instructions (Addendum)
Rt and Lf Cardiac cath Tue 08/11/16  Follow instructions given    Follow up appointment with Dr.Jordan  Monday 08/24/16 at 10:40am.

## 2016-08-01 LAB — PROTIME-INR
INR: 1
Prothrombin Time: 10.4 s (ref 9.0–11.5)

## 2016-08-04 ENCOUNTER — Telehealth: Payer: Self-pay | Admitting: Cardiology

## 2016-08-04 NOTE — Telephone Encounter (Signed)
Advised labwork stable for upcoming cath. Pt voiced understanding and thanks.

## 2016-08-04 NOTE — Telephone Encounter (Signed)
New Message ° ° pt verbalized that he is returning call for rn  °

## 2016-08-05 ENCOUNTER — Other Ambulatory Visit: Payer: Self-pay | Admitting: Internal Medicine

## 2016-08-05 ENCOUNTER — Ambulatory Visit (INDEPENDENT_AMBULATORY_CARE_PROVIDER_SITE_OTHER)
Admission: RE | Admit: 2016-08-05 | Discharge: 2016-08-05 | Disposition: A | Discharge status: Home / Self Care | Payer: Medicare Other | Source: Ambulatory Visit | Attending: Internal Medicine | Admitting: Internal Medicine

## 2016-08-05 ENCOUNTER — Encounter: Payer: Self-pay | Admitting: Internal Medicine

## 2016-08-05 DIAGNOSIS — R590 Localized enlarged lymph nodes: Secondary | ICD-10-CM | POA: Diagnosis not present

## 2016-08-05 DIAGNOSIS — R918 Other nonspecific abnormal finding of lung field: Secondary | ICD-10-CM

## 2016-08-05 MED ORDER — IOPAMIDOL (ISOVUE-300) INJECTION 61%
80.0000 mL | Freq: Once | INTRAVENOUS | Status: AC | PRN
  Administered 2016-08-05: 80 mL via INTRAVENOUS

## 2016-08-06 ENCOUNTER — Telehealth: Payer: Self-pay | Admitting: Emergency Medicine

## 2016-08-06 NOTE — Telephone Encounter (Signed)
Dr [name], pt was last seen by you on 7.3.14 and recommended to follow up November of that year after CT scan: Assessment & Plan:  PULMONARY NODULE Path on ENB negative - repeat CT scan 11/14 and then f/u to review  Pneumothorax after biopsy Appears to be clinically resolved.  - CXR today    Pt has not been seen back in this office Dr Ronnald Ramp PCP referred patient back to this office for 3.28.18 CT Chest results: IMPRESSION: 1. Irregular solid 2.5 cm anterior right upper lobe pulmonary nodule, increased in size and density since 09/12/2012 chest CT, most suggestive of primary bronchogenic adenocarcinoma. Multidisciplinary thoracic oncology consultation advised.   As of now, the first available consult is with MW on 5.1.18 RB please advise if you would be willing/able to see patient sooner than this appt.  Thank you.

## 2016-08-06 NOTE — Telephone Encounter (Signed)
Im opening up a clinic spot 08/10/16 Monday for him. Need to see if he can do that date.

## 2016-08-06 NOTE — Telephone Encounter (Signed)
Called spoke with patient, offered appt as authoriazed by RB Pt okay with this date/time - he is aware to arrive 64mns early for paperwork Nothing further needed; will sign off

## 2016-08-09 DIAGNOSIS — I739 Peripheral vascular disease, unspecified: Secondary | ICD-10-CM

## 2016-08-09 HISTORY — DX: Peripheral vascular disease, unspecified: I73.9

## 2016-08-10 ENCOUNTER — Encounter: Payer: Self-pay | Admitting: Emergency Medicine

## 2016-08-10 ENCOUNTER — Institutional Professional Consult (permissible substitution): Payer: Medicare Other | Admitting: Emergency Medicine

## 2016-08-10 ENCOUNTER — Ambulatory Visit (INDEPENDENT_AMBULATORY_CARE_PROVIDER_SITE_OTHER): Payer: Medicare Other | Admitting: Emergency Medicine

## 2016-08-10 DIAGNOSIS — R911 Solitary pulmonary nodule: Secondary | ICD-10-CM

## 2016-08-10 NOTE — Patient Instructions (Addendum)
We will perform spirometry today to evaluate your lung function.  We will perform a PET scan to evaluate your lung nodule  We will discuss your case with Thoracic Surgery and Cardiology to plan next steps.  Follow with Dr [name] next available after your scan to review

## 2016-08-10 NOTE — Progress Notes (Signed)
Subjective:    Patient ID: James [name], male    DOB: [birth year], 66 y.o.   MRN: [id number]  HPI  66 year old never smoker with a history of a positive PPD as a child, then was non reactive in 2012. Seen by me in 09/2012 for a RUL nodule. At the time it was an irregular groundglass nodule. It had been followed in our office since 2011 and was slowly enlarging. We arranged for navigational bronchoscopy that was non-diagnostic. Since that time I have not seen him, he has been lost to follow-up. Repeat CT scan of his chest ordered by Dr Ronnald Ramp 08/05/16 after he had a viral syndrome has been personally reviewed by me. The right upper lobe nodule is now an irregular, solid 2.5 cm lesion without any GG component. Also noted was some evidence for possible chronic pulmonary embolism in a left lower lobe segmental pulmonary arterial branch.  He has been scheduled to undergo left and right cardiac catheterization for a mitral valve flail leaflet. He is asymptomatic - no cough, dyspnea. He occasionally has mid R chest pain.    Review of Systems  Constitutional: Negative for fever and unexpected weight change.  HENT: Negative for congestion, dental problem, ear pain, nosebleeds, postnasal drip, rhinorrhea, sinus pressure, sneezing, sore throat and trouble swallowing.   Eyes: Negative for redness and itching.  Respiratory: Negative for cough, chest tightness, shortness of breath and wheezing.   Cardiovascular: Negative for palpitations and leg swelling.  Gastrointestinal: Negative for nausea and vomiting.  Genitourinary: Negative for dysuria.  Musculoskeletal: Negative for joint swelling.  Skin: Negative for rash.  Neurological: Negative for headaches.  Hematological: Does not bruise/bleed easily.  Psychiatric/Behavioral: Negative for dysphoric mood. The patient is not nervous/anxious.     Past Medical History:  Diagnosis Date  . Arthritis    "knees, hips, back" (10/19/2012)  . Chronic lower back pain    . Colon polyps    10/27/2002, repeat letter 09/17/2007  . Depressive disorder, not elsewhere classified   . Diabetes mellitus without complication ([place])   . Fasting hyperglycemia   . GERD (gastroesophageal reflux disease)   . Hemoptysis    abnormal CT Chest 01/29/10 - ? new GG changes RUL > not viz on plain cxr 02/26/2010  . MPN (myeloproliferative neoplasm) ([place])    1st detected 06/04/1998  . Obesity   . OSA on CPAP   . Other and unspecified hyperlipidemia   . Positive PPD 1965   "non reactive in 2012" (10/19/2012)  . Routine general medical examination at a health care facility   . Special screening for malignant neoplasm of prostate   . Spinal stenosis, unspecified region other than cervical   . Wrist pain, left      Family History  Problem Relation Age of Onset  . Heart disease Father   . Heart attack Father   . Heart disease Paternal Uncle   . Prostate cancer Neg Hx   . Colon cancer Neg Hx   . Hypertension Neg Hx   . Hyperlipidemia Neg Hx   . Diabetes Neg Hx      Social History   Social History  . Marital status: Married    Spouse name: N/A  . Number of children: 3  . Years of education: N/A   Occupational History  . Retired, Financial risk analyst   . Retired, Real estate     Slow   Social History Main Topics  . Smoking status: Never Smoker  . Smokeless  tobacco: Never Used  . Alcohol use 0.6 oz/week    1 Cans of beer per week  . Drug use: No  . Sexual activity: No   Other Topics Concern  . Not on file   Social History Narrative   HSG, [employer]. Married '73.  2 sons - '74,   '76;  1 daughter -  '77  6 grandchildren.   Work - IT consultant now retired. ACP - not fully discussed.                     Allergies  Allergen Reactions  . Amoxicillin Itching     Outpatient Medications Prior to Visit  Medication Sig Dispense Refill  . aspirin EC 81 MG tablet Take 1 tablet (81 mg total) by mouth daily. 90 tablet 3  . Multiple  Vitamin (MULTIVITAMIN) tablet Take 1 tablet by mouth daily.    . Omega-3 Fatty Acids (FISH OIL) 500 MG CAPS Take 500 mg by mouth.    Marland Kitchen atorvastatin (LIPITOR) 20 MG tablet Take 1 tablet (20 mg total) by mouth daily. (Patient not taking: Reported on 08/10/2016) 90 tablet 3   No facility-administered medications prior to visit.         Objective:   Physical Exam Vitals:   08/10/16 1443  BP: (!) 156/70  Pulse: 85  SpO2: 96%  Weight: 231 lb 9.6 oz (105.1 kg)  Height: '5\' 9"'$  (1.753 m)   Gen: Pleasant overweight man, somewhat anxious, in no distress  ENT:no oral lesions  Neck: No JVD, no stridor  Lungs: No accessory muscle use, clear B   Cardiovascular: regular, 2/6 systolic M  Musculoskeletal: no clubbing, no cyanosis  Neuro: awake, alert, non-focal.   Skin: no rash.       Assessment & Plan:  Mass of upper lobe of right lung Right upper lobe pulmonary nodule. In retrospect this is almost certainly a well-differentiated adenoCA given it's behavior over the last 4 years (actually last 7 years). No imaging since 09/2012. Other possibilities - carcinoid, other inflammatory processes. Doubt infection but possible. I believe that in absence of any contraindication or suggestion of higher stage disease that he should have a surgical resection. I explained my rationale to him today. I will perform a PET scan asap. His spirometry today is normal - FEV1 2.91L (90% predicted).  Complicating issue will be the fact that he is being actively evaluated for MR and a flail leaflet, was being prepared for referral to Dr Roxy Manns for possible repair. ? Whether RUL lobectomy could be done at the same time or whether this would be too much on him. I will need to confer with Dr Martinique and with TCTS.   Baltazar Apo, MD, PhD 08/10/2016, 3:29 PM [facility] [phone] or if no answer [phone]

## 2016-08-10 NOTE — Assessment & Plan Note (Signed)
Right upper lobe pulmonary nodule. In retrospect this is almost certainly a well-differentiated adenoCA given it's behavior over the last 4 years (actually last 7 years). No imaging since 09/2012. Other possibilities - carcinoid, other inflammatory processes. Doubt infection but possible. I believe that in absence of any contraindication or suggestion of higher stage disease that he should have a surgical resection. I explained my rationale to him today. I will perform a PET scan asap. His spirometry today is normal - FEV1 2.91L (90% predicted).  Complicating issue will be the fact that he is being actively evaluated for MR and a flail leaflet, was being prepared for referral to Dr Roxy Manns for possible repair. ? Whether RUL lobectomy could be done at the same time or whether this would be too much on him. I will need to confer with Dr Martinique and with TCTS.

## 2016-08-11 ENCOUNTER — Ambulatory Visit (HOSPITAL_COMMUNITY)
Admission: RE | Admit: 2016-08-11 | Discharge: 2016-08-11 | Disposition: A | Discharge status: Home / Self Care | Payer: Medicare Other | Source: Ambulatory Visit | Attending: Cardiology | Admitting: Cardiology

## 2016-08-11 ENCOUNTER — Encounter (HOSPITAL_COMMUNITY)
Admission: RE | Disposition: A | Discharge status: Home / Self Care | Payer: Self-pay | Source: Ambulatory Visit | Attending: Cardiology

## 2016-08-11 ENCOUNTER — Encounter (HOSPITAL_COMMUNITY): Payer: Self-pay | Admitting: Cardiology

## 2016-08-11 DIAGNOSIS — G8929 Other chronic pain: Secondary | ICD-10-CM | POA: Diagnosis not present

## 2016-08-11 DIAGNOSIS — E782 Mixed hyperlipidemia: Secondary | ICD-10-CM | POA: Diagnosis not present

## 2016-08-11 DIAGNOSIS — M545 Low back pain: Secondary | ICD-10-CM | POA: Insufficient documentation

## 2016-08-11 DIAGNOSIS — Z8249 Family history of ischemic heart disease and other diseases of the circulatory system: Secondary | ICD-10-CM | POA: Diagnosis not present

## 2016-08-11 DIAGNOSIS — I340 Nonrheumatic mitral (valve) insufficiency: Secondary | ICD-10-CM | POA: Diagnosis present

## 2016-08-11 DIAGNOSIS — E119 Type 2 diabetes mellitus without complications: Secondary | ICD-10-CM | POA: Diagnosis not present

## 2016-08-11 DIAGNOSIS — F329 Major depressive disorder, single episode, unspecified: Secondary | ICD-10-CM | POA: Insufficient documentation

## 2016-08-11 DIAGNOSIS — Z6837 Body mass index (BMI) 37.0-37.9, adult: Secondary | ICD-10-CM | POA: Diagnosis not present

## 2016-08-11 DIAGNOSIS — I2510 Atherosclerotic heart disease of native coronary artery without angina pectoris: Secondary | ICD-10-CM | POA: Diagnosis not present

## 2016-08-11 DIAGNOSIS — K219 Gastro-esophageal reflux disease without esophagitis: Secondary | ICD-10-CM | POA: Insufficient documentation

## 2016-08-11 DIAGNOSIS — Z7982 Long term (current) use of aspirin: Secondary | ICD-10-CM | POA: Insufficient documentation

## 2016-08-11 DIAGNOSIS — R599 Enlarged lymph nodes, unspecified: Secondary | ICD-10-CM | POA: Insufficient documentation

## 2016-08-11 DIAGNOSIS — Z880 Allergy status to penicillin: Secondary | ICD-10-CM | POA: Insufficient documentation

## 2016-08-11 DIAGNOSIS — E118 Type 2 diabetes mellitus with unspecified complications: Secondary | ICD-10-CM | POA: Diagnosis present

## 2016-08-11 DIAGNOSIS — E669 Obesity, unspecified: Secondary | ICD-10-CM | POA: Diagnosis not present

## 2016-08-11 DIAGNOSIS — G4733 Obstructive sleep apnea (adult) (pediatric): Secondary | ICD-10-CM | POA: Diagnosis not present

## 2016-08-11 DIAGNOSIS — E785 Hyperlipidemia, unspecified: Secondary | ICD-10-CM | POA: Diagnosis present

## 2016-08-11 HISTORY — PX: RIGHT/LEFT HEART CATH AND CORONARY ANGIOGRAPHY: CATH118266

## 2016-08-11 HISTORY — PX: INTRAVASCULAR PRESSURE WIRE/FFR STUDY: CATH118243

## 2016-08-11 LAB — POCT I-STAT 3, ART BLOOD GAS (G3+)
Acid-base deficit: 3 mmol/L — ABNORMAL HIGH (ref 0.0–2.0)
Bicarbonate: 23.3 mmol/L (ref 20.0–28.0)
O2 Saturation: 97 %
TCO2: 25 mmol/L (ref 0–100)
pCO2 arterial: 46.9 mmHg (ref 32.0–48.0)
pH, Arterial: 7.305 — ABNORMAL LOW (ref 7.350–7.450)
pO2, Arterial: 99 mmHg (ref 83.0–108.0)

## 2016-08-11 LAB — POCT ACTIVATED CLOTTING TIME: Activated Clotting Time: 230 seconds

## 2016-08-11 LAB — POCT I-STAT 3, VENOUS BLOOD GAS (G3P V)
Acid-base deficit: 2 mmol/L (ref 0.0–2.0)
Bicarbonate: 23.8 mmol/L (ref 20.0–28.0)
O2 Saturation: 75 %
TCO2: 25 mmol/L (ref 0–100)
pCO2, Ven: 44.3 mmHg (ref 44.0–60.0)
pH, Ven: 7.337 (ref 7.250–7.430)
pO2, Ven: 43 mmHg (ref 32.0–45.0)

## 2016-08-11 LAB — GLUCOSE, CAPILLARY: Glucose-Capillary: 126 mg/dL — ABNORMAL HIGH (ref 65–99)

## 2016-08-11 SURGERY — RIGHT/LEFT HEART CATH AND CORONARY ANGIOGRAPHY
Anesthesia: LOCAL

## 2016-08-11 MED ORDER — SODIUM CHLORIDE 0.9% FLUSH: 3.0000 mL | Freq: Two times a day (BID) | INTRAVENOUS | Status: DC

## 2016-08-11 MED ORDER — HEPARIN SODIUM (PORCINE) 1000 UNIT/ML IJ SOLN
INTRAMUSCULAR | Status: DC | PRN
  Administered 2016-08-11: 5000 [IU] via INTRAVENOUS
  Administered 2016-08-11: 3000 [IU] via INTRAVENOUS

## 2016-08-11 MED ORDER — SODIUM CHLORIDE 0.9 % IV SOLN: 250.0000 mL | INTRAVENOUS | Status: DC | PRN

## 2016-08-11 MED ORDER — IOPAMIDOL (ISOVUE-370) INJECTION 76%
INTRAVENOUS | Status: AC
Start: 2016-08-11 — End: 2016-08-11
  Filled 2016-08-11: qty 100

## 2016-08-11 MED ORDER — ADENOSINE 12 MG/4ML IV SOLN
INTRAVENOUS | Status: AC
  Filled 2016-08-11: qty 16

## 2016-08-11 MED ORDER — LIDOCAINE HCL (PF) 1 % IJ SOLN
INTRAMUSCULAR | Status: AC
  Filled 2016-08-11: qty 30

## 2016-08-11 MED ORDER — VERAPAMIL HCL 2.5 MG/ML IV SOLN
INTRAVENOUS | Status: DC | PRN
  Administered 2016-08-11: 09:00:00 via INTRA_ARTERIAL

## 2016-08-11 MED ORDER — FENTANYL CITRATE (PF) 100 MCG/2ML IJ SOLN
INTRAMUSCULAR | Status: DC | PRN
  Administered 2016-08-11 (×3): 25 ug via INTRAVENOUS

## 2016-08-11 MED ORDER — MIDAZOLAM HCL 2 MG/2ML IJ SOLN
INTRAMUSCULAR | Status: AC
  Filled 2016-08-11: qty 2

## 2016-08-11 MED ORDER — SODIUM CHLORIDE 0.9 % IV SOLN
INTRAVENOUS | Status: DC
  Administered 2016-08-11: 08:00:00 via INTRAVENOUS

## 2016-08-11 MED ORDER — LIDOCAINE HCL (PF) 1 % IJ SOLN
INTRAMUSCULAR | Status: DC | PRN
  Administered 2016-08-11 (×2): 2 mL via INTRADERMAL

## 2016-08-11 MED ORDER — IOPAMIDOL (ISOVUE-370) INJECTION 76%
INTRAVENOUS | Status: DC | PRN
  Administered 2016-08-11: 110 mL via INTRA_ARTERIAL

## 2016-08-11 MED ORDER — HEPARIN (PORCINE) IN NACL 2-0.9 UNIT/ML-% IJ SOLN
INTRAMUSCULAR | Status: AC
  Filled 2016-08-11: qty 1000

## 2016-08-11 MED ORDER — FENTANYL CITRATE (PF) 100 MCG/2ML IJ SOLN
INTRAMUSCULAR | Status: AC
Start: 2016-08-11 — End: 2016-08-11
  Filled 2016-08-11: qty 2

## 2016-08-11 MED ORDER — ASPIRIN 81 MG PO CHEW: 81.0000 mg | CHEWABLE_TABLET | ORAL | Status: DC

## 2016-08-11 MED ORDER — HEPARIN SODIUM (PORCINE) 1000 UNIT/ML IJ SOLN
INTRAMUSCULAR | Status: AC
  Filled 2016-08-11: qty 1

## 2016-08-11 MED ORDER — MIDAZOLAM HCL 2 MG/2ML IJ SOLN
INTRAMUSCULAR | Status: DC | PRN
  Administered 2016-08-11: 1 mg via INTRAVENOUS
  Administered 2016-08-11: 2 mg via INTRAVENOUS
  Administered 2016-08-11: 1 mg via INTRAVENOUS

## 2016-08-11 MED ORDER — SODIUM CHLORIDE 0.9 % WEIGHT BASED INFUSION: 1.0000 mL/kg/h | INTRAVENOUS | Status: AC

## 2016-08-11 MED ORDER — SODIUM CHLORIDE 0.9% FLUSH: 3.0000 mL | INTRAVENOUS | Status: DC | PRN

## 2016-08-11 MED ORDER — HEPARIN (PORCINE) IN NACL 2-0.9 UNIT/ML-% IJ SOLN
INTRAMUSCULAR | Status: DC | PRN
  Administered 2016-08-11: 1000 mL

## 2016-08-11 MED ORDER — IOPAMIDOL (ISOVUE-370) INJECTION 76%
INTRAVENOUS | Status: AC
Start: 2016-08-11 — End: 2016-08-11
  Filled 2016-08-11: qty 50

## 2016-08-11 MED ORDER — ADENOSINE (DIAGNOSTIC) 140MCG/KG/MIN
INTRAVENOUS | Status: DC | PRN
  Administered 2016-08-11: 140 ug/kg/min via INTRAVENOUS

## 2016-08-11 SURGICAL SUPPLY — 15 items
CATH 5FR JL3.5 JR4 ANG PIG MP (CATHETERS) ×1 IMPLANT
CATH BALLN WEDGE 5F 110CM (CATHETERS) ×1 IMPLANT
CATH LAUNCHER 5F EBU3.5 (CATHETERS) ×1 IMPLANT
DEVICE RAD COMP TR BAND LRG (VASCULAR PRODUCTS) ×1 IMPLANT
GLIDESHEATH SLEND SS 6F .021 (SHEATH) ×1 IMPLANT
GUIDEWIRE INQWIRE 1.5J.035X260 (WIRE) IMPLANT
GUIDEWIRE PRESSURE COMET II (WIRE) ×1 IMPLANT
INQWIRE 1.5J .035X260CM (WIRE) ×2
KIT ESSENTIALS PG (KITS) ×1 IMPLANT
KIT HEART LEFT (KITS) ×2 IMPLANT
PACK CARDIAC CATHETERIZATION (CUSTOM PROCEDURE TRAY) ×2 IMPLANT
SHEATH FAST CATH BRACH 5F 5CM (SHEATH) ×1 IMPLANT
SYR MEDRAD MARK V 150ML (SYRINGE) ×2 IMPLANT
TRANSDUCER W/STOPCOCK (MISCELLANEOUS) ×2 IMPLANT
TUBING CIL FLEX 10 FLL-RA (TUBING) ×2 IMPLANT

## 2016-08-11 NOTE — Discharge Instructions (Signed)
Radial Site Care °Refer to this sheet in the next few weeks. These instructions provide you with information about caring for yourself after your procedure. Your health care provider may also give you more specific instructions. Your treatment has been planned according to current medical practices, but problems sometimes occur. Call your health care provider if you have any problems or questions after your procedure. °What can I expect after the procedure? °After your procedure, it is typical to have the following: °· Bruising at the radial site that usually fades within 1-2 weeks. °· Blood collecting in the tissue (hematoma) that may be painful to the touch. It should usually decrease in size and tenderness within 1-2 weeks. °Follow these instructions at home: °· Take medicines only as directed by your health care provider. °· You may shower 24-48 hours after the procedure or as directed by your health care provider. Remove the bandage (dressing) and gently wash the site with plain soap and water. Pat the area dry with a clean towel. Do not rub the site, because this may cause bleeding. °· Do not take baths, swim, or use a hot tub until your health care provider approves. °· Check your insertion site every day for redness, swelling, or drainage. °· Do not apply powder or lotion to the site. °· Do not flex or bend the affected arm for 24 hours or as directed by your health care provider. °· Do not push or pull heavy objects with the affected arm for 24 hours or as directed by your health care provider. °· Do not lift over 10 lb (4.5 kg) for 5 days after your procedure or as directed by your health care provider. °· Ask your health care provider when it is okay to: °¨ Return to work or school. °¨ Resume usual physical activities or sports. °¨ Resume sexual activity. °· Do not drive home if you are discharged the same day as the procedure. Have someone else drive you. °· You may drive 24 hours after the procedure  unless otherwise instructed by your health care provider. °· Do not operate machinery or power tools for 24 hours after the procedure. °· If your procedure was done as an outpatient procedure, which means that you went home the same day as your procedure, a responsible adult should be with you for the first 24 hours after you arrive home. °· Keep all follow-up visits as directed by your health care provider. This is important. °Contact a health care provider if: °· You have a fever. °· You have chills. °· You have increased bleeding from the radial site. Hold pressure on the site. °Get help right away if: °· You have unusual pain at the radial site. °· You have redness, warmth, or swelling at the radial site. °· You have drainage (other than a small amount of blood on the dressing) from the radial site. °· The radial site is bleeding, and the bleeding does not stop after 30 minutes of holding steady pressure on the site. °· Your arm or hand becomes pale, cool, tingly, or numb. °This information is not intended to replace advice given to you by your health care provider. Make sure you discuss any questions you have with your health care provider. °Document Released: 05/30/2010 Document Revised: 10/03/2015 Document Reviewed: 11/13/2013 °Elsevier Interactive Patient Education © 2017 [employer] Inc[employer] ° °

## 2016-08-11 NOTE — Interval H&P Note (Signed)
History and Physical Interval Note:  08/11/2016 8:07 AM  James [name]  has presented today for surgery, with the diagnosis of sob, mitrial regurgetation  The various methods of treatment have been discussed with the patient and family. After consideration of risks, benefits and other options for treatment, the patient has consented to  Procedure(s): Right/Left Heart Cath and Coronary Angiography (N/A) as a surgical intervention .  The patient's history has been reviewed, patient examined, no change in status, stable for surgery.  I have reviewed the patient's chart and labs.  Questions were answered to the patient's satisfaction.     Collier Salina [facility] 08/11/2016 8:07 AM

## 2016-08-11 NOTE — H&P (View-Only) (Signed)
Cardiology Office Note    Date:  07/31/2016   ID:  James [name], DOB 21-May-1950, MRN [id number]  PCP:  [name], MD  Cardiologist:  Tashaun Obey [place], MD    History of Present Illness:  James [name] is a 66 y.o. male is seen for follow up post TEE evaluation of a  heart murmur secondary to MR. History of DM type 2 on oral therapy and HLD. He states he was in good health until he recently had the flu. He was seen by his physician and a heart murmur was noted. He states he has never had a heart murmur before. He considers himself to be in good health. He walks daily at least 3 miles/day. Also works out on [employer]. Notes some dyspnea sometimes when he first starts but once he gets going he feels fine. No chest pain, edema, palpitations, dizziness, orthopnea, or PND.   On his last visit he was noted to have a loud murmur of MR. Careful review of his Echo suggested a partially flail leaflet. MR was graded as mild but this was felt to be an underestimate. A TEE was performed with results noted below.  On follow up today he is very anxious. Does note more dyspnea when walking up stairs and starting exercise. Seen recently by primary care who noted an enlarged lymph node in his neck. CT planned next week.   Past Medical History:  Diagnosis Date  . Arthritis    "knees, hips, back" (10/19/2012)  . Chronic lower back pain   . Colon polyps    10/27/2002, repeat letter 09/17/2007  . Depressive disorder, not elsewhere classified   . Diabetes mellitus without complication ([place])   . Fasting hyperglycemia   . GERD (gastroesophageal reflux disease)   . Hemoptysis    abnormal CT Chest 01/29/10 - ? new GG changes RUL > not viz on plain cxr 02/26/2010  . MPN (myeloproliferative neoplasm) ([place])    1st detected 06/04/1998  . Obesity   . OSA on CPAP   . Other and unspecified hyperlipidemia   . Positive PPD 1965   "non reactive in 2012" (10/19/2012)  . Routine general medical examination at a health  care facility   . Special screening for malignant neoplasm of prostate   . Spinal stenosis, unspecified region other than cervical   . Wrist pain, left     Past Surgical History:  Procedure Laterality Date  . ANTERIOR CRUCIATE LIGAMENT REPAIR Left 1967  . CARDIAC CATHETERIZATION  2000  . CHEST TUBE INSERTION Right 10/19/2012   post bronch  . COLONOSCOPY W/ POLYPECTOMY    . FLEXIBLE BRONCHOSCOPY  10/19/2012   Flexible video fiberoptic bronchoscopy with electromagnetic navigation and biopsies.  . TEE WITHOUT CARDIOVERSION N/A 07/17/2016   Procedure: TRANSESOPHAGEAL ECHOCARDIOGRAM (TEE);  Surgeon: [name], MD;  Location: [facility] ENDOSCOPY;  Service: Cardiovascular;  Laterality: N/A;  . TONSILLECTOMY  1950's  . TOTAL HIP ARTHROPLASTY Left 10/05/2014   dr [name]  . TOTAL HIP ARTHROPLASTY Left 10/05/2014   Procedure: LEFT TOTAL HIP ARTHROPLASTY ANTERIOR APPROACH;  Surgeon: [name], MD;  Location: [facility];  Service: Orthopedics;  Laterality: Left;  Marland Kitchen VIDEO BRONCHOSCOPY WITH ENDOBRONCHIAL NAVIGATION N/A 10/19/2012   Procedure: VIDEO BRONCHOSCOPY WITH ENDOBRONCHIAL NAVIGATION;  Surgeon: [name], MD;  Location: [facility];  Service: Thoracic;  Laterality: N/A;  . WRIST RECONSTRUCTION  12/2009   'proximal row carpectomy" [name]    Current Medications: Outpatient Medications Prior to Visit  Medication  Sig Dispense Refill  . aspirin EC 81 MG tablet Take 1 tablet (81 mg total) by mouth daily. 90 tablet 3  . atorvastatin (LIPITOR) 20 MG tablet Take 1 tablet (20 mg total) by mouth daily. 90 tablet 3  . Multiple Vitamin (MULTIVITAMIN) tablet Take 1 tablet by mouth daily.    . Omega-3 Fatty Acids (FISH OIL) 500 MG CAPS Take 500 mg by mouth.     No facility-administered medications prior to visit.      Allergies:   Amoxicillin   Social History   Social History  . Marital status: Married    Spouse name: N/A  . Number of children: 3  . Years of education: N/A   Occupational History  .  Retired, Financial risk analyst   . Retired, Real estate     [employer]   Social History Main Topics  . Smoking status: Never Smoker  . Smokeless tobacco: Never Used  . Alcohol use 0.6 oz/week    1 Cans of beer per week  . Drug use: No  . Sexual activity: No   Other Topics Concern  . None   Social History Narrative   HSG, [employer]. Married '73.  2 sons - '74,   '76;  1 daughter -  '77  6 grandchildren.   Work - IT consultant now retired. ACP - not fully discussed.                     Family History:  The patient's family history includes Heart attack in his father; Heart disease in his father and paternal uncle.   ROS:   Please see the history of present illness.    ROS All other systems reviewed and are negative.   PHYSICAL EXAM:   VS:  BP (!) 146/78   Pulse 66   Ht '5\' 6"'$  (1.676 m)   Wt 234 lb (106.1 kg)   BMI 37.77 kg/m    GEN: Well nourished, well developed, in no acute distress  HEENT: normal  Neck: no JVD, carotid bruits, or masses Cardiac: RRR; there is a loud gr 3/6 harsh systolic murmur heard best at the LSB and apex radiating into the left axilla. No diastolic murmur or gallop. No edema.  Respiratory:  clear to auscultation bilaterally, normal work of breathing GI: soft, nontender, nondistended, + BS MS: no deformity or atrophy  Skin: warm and dry, no rash Neuro:  Alert and Oriented x 3, Strength and sensation are intact Psych: euthymic mood, full affect  Wt Readings from Last 3 Encounters:  07/31/16 234 lb (106.1 kg)  07/27/16 234 lb (106.1 kg)  07/15/16 233 lb (105.7 kg)      Studies/Labs Reviewed:   EKG:  EKG is not ordered today.    Recent Labs: 06/29/2016: ALT 27; BUN 15; Creatinine, Ser 1.06; Hemoglobin 15.7; Platelets 182.0; Potassium 4.6; Sodium 141 07/27/2016: TSH 2.14   Lipid Panel    Component Value Date/Time   CHOL 245 (H) 06/29/2016 1153   TRIG 198.0 (H) 06/29/2016 1153   TRIG 108 05/20/2006 0825   HDL 35.90  (L) 06/29/2016 1153   CHOLHDL 7 06/29/2016 1153   VLDL 39.6 06/29/2016 1153   LDLCALC 170 (H) 06/29/2016 1153   LDLDIRECT 177.0 07/24/2015 1052    Additional studies/ records that were reviewed today include:   Event monitor 08/14/15: Study Highlights   NSR  Average HR 64 bpm No significant arrhythmias No AV block No pauses  Echo: 07/01/16: Study Conclusions  - Left ventricle: The cavity size was mildly dilated. There was   mild concentric hypertrophy. Systolic function was normal. The   estimated ejection fraction was in the range of 60% to 65%. Wall   motion was normal; there were no regional wall motion   abnormalities. Left ventricular diastolic function parameters   were normal. - Aortic valve: Transvalvular velocity was within the normal range.   There was no stenosis. There was no regurgitation. - Mitral valve: Transvalvular velocity was within the normal range.   There was no evidence for stenosis. There was mild regurgitation. - Left atrium: The atrium was moderately dilated. - Right ventricle: The cavity size was normal. Wall thickness was   normal. Systolic function was normal. - Atrial septum: No defect or patent foramen ovale was identified   by color flow Doppler. - Tricuspid valve: There was trivial regurgitation. - Pulmonary arteries: Systolic pressure was within the normal   range. PA peak pressure: 24 mm Hg (S).  TEE 07/17/16: Study Conclusions  - Left ventricle: The cavity size was normal. There was mild   concentric hypertrophy. Systolic function was normal. The   estimated ejection fraction was in the range of 60% to 65%. Wall   motion was normal; there were no regional wall motion   abnormalities. - Aortic valve: No evidence of vegetation. - Mitral valve: There is a partially flail P3 segment due to   ruptured cord, seen prolapsing in both 2D and 3D images. There is   severe, posterolaterally directed mitral regurgitation which   swirls into  the left atrial appendage. - Left atrium: The atrium was dilated. - Right atrium: No evidence of thrombus in the atrial cavity or   appendage. - Atrial septum: No defect or patent foramen ovale was identified.  Impressions:  - Flail P3 segment of the mitral leaflet with a rupture cord and   severe, posterolaterally directed MR which swirls in the LAA.   LVEF 60-65%.  ASSESSMENT:    1. Non-rheumatic mitral regurgitation      PLAN:  In order of problems listed above:  1. Mr. Skog murmur is discordant with the transthoracic Echo report. TEE confirmed a ruptured chord involving the P3 leaflet with severe MR.  He is mildly symptomatic. Supporting the diagnosis of more significant MR is the fact that he has some LV and LA enlargement. We discussed findings in detail today. We discussed potential treatment with MV repair. Given severity of MR I would favor repair sooner than later to reduce risk of long term sequela such as pulmonary HTN, CHF, Afib. Next step is to do a [facility] to assess hemodynamics and rule out CAD. Will proceed with this April 3. Following this will refer to Dr Roxy Manns. The procedure and risks were reviewed including but not limited to death, myocardial infarction, stroke, arrythmias, bleeding, transfusion, emergency surgery, dye allergy, or renal dysfunction. The patient voices understanding and is agreeable to proceed. 2. Enlarge lymph node note in neck. Follow up CT. 3. Mixed hyperlipidemia. Now on high dose statin therapy.    Medication Adjustments/Labs and Tests Ordered: Current medicines are reviewed at length with the patient today.  Concerns regarding medicines are outlined above.  Medication changes, Labs and Tests ordered today are listed in the Patient Instructions below. Patient Instructions  Rt and Lf Cardiac cath Tue 08/11/16  Follow instructions given    Follow up appointment with Dr.Susumu Hackler  Monday 08/24/16 at 10:40am.    Signed, Collier Salina  [place], MD    07/31/2016 11:46 AM    [facility] [address], [place], [place], [address] [phone]

## 2016-08-17 ENCOUNTER — Ambulatory Visit (HOSPITAL_COMMUNITY)
Admission: RE | Admit: 2016-08-17 | Discharge: 2016-08-17 | Disposition: A | Discharge status: Home / Self Care | Payer: Medicare Other | Source: Ambulatory Visit | Attending: Emergency Medicine | Admitting: Emergency Medicine

## 2016-08-17 DIAGNOSIS — R911 Solitary pulmonary nodule: Secondary | ICD-10-CM | POA: Insufficient documentation

## 2016-08-17 DIAGNOSIS — K5730 Diverticulosis of large intestine without perforation or abscess without bleeding: Secondary | ICD-10-CM | POA: Insufficient documentation

## 2016-08-17 DIAGNOSIS — N400 Enlarged prostate without lower urinary tract symptoms: Secondary | ICD-10-CM | POA: Diagnosis not present

## 2016-08-17 DIAGNOSIS — I700 Atherosclerosis of aorta: Secondary | ICD-10-CM | POA: Diagnosis not present

## 2016-08-17 DIAGNOSIS — I2510 Atherosclerotic heart disease of native coronary artery without angina pectoris: Secondary | ICD-10-CM | POA: Diagnosis not present

## 2016-08-17 LAB — GLUCOSE, CAPILLARY: Glucose-Capillary: 128 mg/dL — ABNORMAL HIGH (ref 65–99)

## 2016-08-17 MED ORDER — FLUDEOXYGLUCOSE F - 18 (FDG) INJECTION
11.3100 | Freq: Once | INTRAVENOUS | Status: AC | PRN
  Administered 2016-08-17: 11.31 via INTRAVENOUS

## 2016-08-20 ENCOUNTER — Encounter: Payer: Self-pay | Admitting: Emergency Medicine

## 2016-08-20 ENCOUNTER — Ambulatory Visit (INDEPENDENT_AMBULATORY_CARE_PROVIDER_SITE_OTHER): Payer: Medicare Other | Admitting: Emergency Medicine

## 2016-08-20 DIAGNOSIS — R918 Other nonspecific abnormal finding of lung field: Secondary | ICD-10-CM

## 2016-08-20 NOTE — Patient Instructions (Signed)
Your PET scan does not show any activity except for your suspicious right upper lobe pulmonary nodule. This fact as well as your good performance on your breathing tests makes you a candidate for a lung surgery to remove the lobe with the suspected lung cancer.  Please discuss this with Dr Roxy Manns when you see him 4/13 for your mitral valve. He will have recommendations about how to synchronize and plan the two procedures that you need.

## 2016-08-20 NOTE — Progress Notes (Signed)
Subjective:    Patient ID: James [name], male    DOB: June 25, 1950, 66 y.o.   MRN: [id number]  HPI  66 year old never smoker with a history of a positive PPD as a child, then was non reactive in 2012. Seen by me in 09/2012 for a RUL nodule. At the time it was an irregular groundglass nodule. It had been followed in our office since 2011 and was slowly enlarging. We arranged for navigational bronchoscopy that was non-diagnostic. Since that time I have not seen him, he has been lost to follow-up. Repeat CT scan of his chest ordered by Dr James [name] 08/05/16 after he had a viral syndrome has been personally reviewed by me. The right upper lobe nodule is now an irregular, solid 2.5 cm lesion without any GG component. Also noted was some evidence for possible chronic pulmonary embolism in a left lower lobe segmental pulmonary arterial branch.  He has been scheduled to undergo left and right cardiac catheterization for a mitral valve flail leaflet. He is asymptomatic - no cough, dyspnea. He occasionally has mid R chest pain.   ROV 08/20/16 -- follow up visit for evaluation of an enlarging RUL nodule. He also is being followed by Dr James [name] for flail MV leaflet and possible repair. He underwent L and R heart cath as below. Showed CAD, normal CO, normal R and L heart filling pressures. I performed a PET scan 4/9 and have personally reviewed. Shows that the RUL nodule is hypermetabolic, no hilar or mediastinal LAD seen. No distant disease.    Review of Systems  Constitutional: Negative for fever and unexpected weight change.  HENT: Negative for congestion, dental problem, ear pain, nosebleeds, postnasal drip, rhinorrhea, sinus pressure, sneezing, sore throat and trouble swallowing.   Eyes: Negative for redness and itching.  Respiratory: Negative for cough, chest tightness, shortness of breath and wheezing.   Cardiovascular: Negative for palpitations and leg swelling.  Gastrointestinal: Negative for nausea and vomiting.   Genitourinary: Negative for dysuria.  Musculoskeletal: Negative for joint swelling.  Skin: Negative for rash.  Neurological: Negative for headaches.  Hematological: Does not bruise/bleed easily.  Psychiatric/Behavioral: Negative for dysphoric mood. The patient is not nervous/anxious.     Past Medical History:  Diagnosis Date  . Arthritis    "knees, hips, back" (10/19/2012)  . Chronic lower back pain   . Colon polyps    10/27/2002, repeat letter 09/17/2007  . Depressive disorder, not elsewhere classified   . Diabetes mellitus without complication ([place])   . Fasting hyperglycemia   . GERD (gastroesophageal reflux disease)   . Hemoptysis    abnormal CT Chest 01/29/10 - ? new GG changes RUL > not viz on plain cxr 02/26/2010  . MPN (myeloproliferative neoplasm) ([place])    1st detected 06/04/1998  . Obesity   . OSA on CPAP   . Other and unspecified hyperlipidemia   . Positive PPD 1965   "non reactive in 2012" (10/19/2012)  . Routine general medical examination at a health care facility   . Special screening for malignant neoplasm of prostate   . Spinal stenosis, unspecified region other than cervical   . Wrist pain, left      Family History  Problem Relation Age of Onset  . Heart disease Father   . Heart attack Father   . Heart disease Paternal Uncle   . Prostate cancer Neg Hx   . Colon cancer Neg Hx   . Hypertension Neg Hx   . Hyperlipidemia Neg Hx   .  Diabetes Neg Hx      Social History   Social History  . Marital status: Married    Spouse name: N/A  . Number of children: 3  . Years of education: N/A   Occupational History  . Retired, Financial risk analyst   . Retired, Real estate     [employer]   Social History Main Topics  . Smoking status: Never Smoker  . Smokeless tobacco: Never Used  . Alcohol use 0.6 oz/week    1 Cans of beer per week  . Drug use: No  . Sexual activity: No   Other Topics Concern  . Not on file   Social History Narrative   HSG, [employer]. Married '73.  2 sons - '74,   '76;  1 daughter -  '77  6 grandchildren.   Work - IT consultant now retired. ACP - not fully discussed.                     Allergies  Allergen Reactions  . Amoxicillin Itching     Outpatient Medications Prior to Visit  Medication Sig Dispense Refill  . aspirin EC 81 MG tablet Take 1 tablet (81 mg total) by mouth daily. 90 tablet 3  . atorvastatin (LIPITOR) 20 MG tablet Take 1 tablet (20 mg total) by mouth daily. 90 tablet 3  . Multiple Vitamin (MULTIVITAMIN) tablet Take 1 tablet by mouth daily.    . Omega-3 Fatty Acids (FISH OIL) 500 MG CAPS Take 500 mg by mouth.     No facility-administered medications prior to visit.         Objective:   Physical Exam Vitals:   08/20/16 1451  BP: (!) 158/80  Pulse: 70  SpO2: 97%  Weight: 232 lb 3.2 oz (105.3 kg)  Height: '5\' 9"'$  (1.753 m)   Gen: Pleasant overweight man, somewhat anxious, in no distress  ENT:no oral lesions  Neck: No JVD, no stridor  Lungs: No accessory muscle use, clear B   Cardiovascular: regular, 2/6 systolic M  Musculoskeletal: no clubbing, no cyanosis  Neuro: awake, alert, non-focal.   Skin: no rash.       Assessment & Plan:  Mass of upper lobe of right lung RUL nodule is hypoermetabolic, almost certainly an adenoCA. No evidence distant disease so would be surgical candidate. He is planning to meet Dr James [name] 4/13, and I have asked him to discuss the nodule, possible resection with him. I will message Dr James [name] to give him the history.   Your PET scan does not show any activity except for your suspicious right upper lobe pulmonary nodule. This fact as well as your good performance on your breathing tests makes you a candidate for a lung surgery to remove the lobe with the suspected lung cancer.  Please discuss this with Dr James [name] when you see him 4/13 for your mitral valve. He will have recommendations about how to synchronize and plan the two  procedures that you need.  James Apo, MD, PhD 08/20/2016, 3:43 PM [facility] [phone] or if no answer [phone]

## 2016-08-20 NOTE — Assessment & Plan Note (Signed)
RUL nodule is hypoermetabolic, almost certainly an adenoCA. No evidence distant disease so would be surgical candidate. He is planning to meet Dr Roxy Manns 4/13, and I have asked him to discuss the nodule, possible resection with him. I will message Dr Roxy Manns to give him the history.   Your PET scan does not show any activity except for your suspicious right upper lobe pulmonary nodule. This fact as well as your good performance on your breathing tests makes you a candidate for a lung surgery to remove the lobe with the suspected lung cancer.  Please discuss this with Dr Roxy Manns when you see him 4/13 for your mitral valve. He will have recommendations about how to synchronize and plan the two procedures that you need.

## 2016-08-21 ENCOUNTER — Encounter: Payer: Self-pay | Admitting: Thoracic Surgery (Cardiothoracic Vascular Surgery)

## 2016-08-21 ENCOUNTER — Other Ambulatory Visit: Payer: Self-pay | Admitting: *Deleted

## 2016-08-21 ENCOUNTER — Institutional Professional Consult (permissible substitution) (INDEPENDENT_AMBULATORY_CARE_PROVIDER_SITE_OTHER): Payer: Medicare Other | Admitting: Thoracic Surgery (Cardiothoracic Vascular Surgery)

## 2016-08-21 VITALS — BP 142/75 | HR 65 | Resp 16 | Ht 69.0 in | Wt 228.0 lb

## 2016-08-21 DIAGNOSIS — G4733 Obstructive sleep apnea (adult) (pediatric): Secondary | ICD-10-CM | POA: Diagnosis not present

## 2016-08-21 DIAGNOSIS — Z9989 Dependence on other enabling machines and devices: Secondary | ICD-10-CM | POA: Diagnosis not present

## 2016-08-21 DIAGNOSIS — D381 Neoplasm of uncertain behavior of trachea, bronchus and lung: Secondary | ICD-10-CM | POA: Diagnosis not present

## 2016-08-21 DIAGNOSIS — C3411 Malignant neoplasm of upper lobe, right bronchus or lung: Secondary | ICD-10-CM

## 2016-08-21 DIAGNOSIS — R918 Other nonspecific abnormal finding of lung field: Secondary | ICD-10-CM | POA: Diagnosis not present

## 2016-08-21 DIAGNOSIS — I340 Nonrheumatic mitral (valve) insufficiency: Secondary | ICD-10-CM

## 2016-08-21 DIAGNOSIS — C7931 Secondary malignant neoplasm of brain: Secondary | ICD-10-CM

## 2016-08-21 NOTE — Progress Notes (Addendum)
[address]       [place] [address]             [phone]     CARDIOTHORACIC SURGERY CONSULTATION REPORT  Referring Provider is Martinique, Peter M, MD PCP is [name], MD  Chief Complaint  Patient presents with  . Mitral Regurgitation    TEE 07/17/16.Marland KitchenCATH 08/11/16  . Lung Lesion    RUL....hypermetabolic on PET 08/11/66.Marland KitchenCT NECK/CHEST 08/05/16.Marland Kitchenspiromety only in pulmonolgy office    HPI:  Patient is a 66 year old male with history of recently discovered mitral valve prolapse and severe mitral regurgitation, type 2 diabetes mellitus, hyperlipidemia, obstructive sleep apnea on CPAP, and enlarging right upper lobe lung mass that has been followed for several years who has been referred for surgical consultation. The patient has no previous reported history of heart murmur or mitral valve prolapse but was recently noted to have a prominent systolic murmur on physical exam when he was evaluated by his primary care physician because of a bout with influenza.  He was referred for cardiology consultation and evaluated by [name]. Although baseline transthoracic echocardiogram performed 07/01/2016 revealed what was felt to be only mild mitral regurgitation, the patient's murmur on physical exam was quite prominent and subsequently prompted transesophageal echocardiogram on 07/17/2016. This confirmed the presence of mitral valve prolapse with a flail segment of the posterior leaflet close to the posterior commissure with severe mitral regurgitation. Left ventricular systolic function was normal.  Coincidentally, at the time the patient was seen by his primary care physician in February it was noted that no follow-up CT scans have been performed since 2014 when the patient was evaluated by [name] because of a right upper lobe lung nodule. Repeat CT scan of the chest performed 08/05/2016 revealed an irregular solid 2.5 cm mass in the right upper lobe which had increased  significantly in size, concerning for the possibility of primary lung cancer. There were also findings consistent with possible small subsegmental chronic pulmonary embolism the left lower lobe. Patient was referred back to [name] and nuclear medicine that imaging was performed 08/17/2016. This revealed a hypermetabolic solid 2.6 cm mass in the right upper lobe consistent with bronchogenic carcinoma. There was no increased metabolic activity in the mediastinum or elsewhere to suggest the presence of metastatic disease.  The patient was referred for elective surgical consultation.  The patient is married and lives locally in [place] with his wife. 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 He has remained fairly active in retirement, particular since he underwent left total hip replacement. He still has some issues with arthritis in his left knee but overall the patient is quite active physically. He walks every day, typically is much as 4 or 5 miles a day. He states that he experiences mild exertional shortness of breath when he first get started walking but this seems to go away very quickly and he can walk at a fairly brisk pace with any significant dyspnea. He denies any symptoms of chest pain or chest tightness either with activity or at rest other than occasional brief twinges of tightness across the mid chest that seemed to be transient, short-lived, and unrelated to physical activity. He denies any history of cough, hemoptysis, or pleuritic chest pain. He denies any history of resting shortness of breath, PND, orthopnea, or lower extremity edema. He has not been having any headaches or other  transient neurologic symptoms. Appetite is good and weight is stable.  Past Medical History:  Diagnosis Date  . Arthritis    "knees, hips, back" (10/19/2012)  . Chronic lower back pain   . Colon polyps    10/27/2002, repeat letter 09/17/2007    . Depressive disorder, not elsewhere classified   . Diabetes mellitus without complication ([place])   . Fasting hyperglycemia   . GERD (gastroesophageal reflux disease)   . Hemoptysis    abnormal CT Chest 01/29/10 - ? new GG changes RUL > not viz on plain cxr 02/26/2010  . MPN (myeloproliferative neoplasm) ([place])    1st detected 06/04/1998  . Obesity   . OSA on CPAP   . Other and unspecified hyperlipidemia   . Positive PPD 1965   "non reactive in 2012" (10/19/2012)  . Routine general medical examination at a health care facility   . Special screening for malignant neoplasm of prostate   . Spinal stenosis, unspecified region other than cervical   . Wrist pain, left     Past Surgical History:  Procedure Laterality Date  . ANTERIOR CRUCIATE LIGAMENT REPAIR Left 1967  . CARDIAC CATHETERIZATION  2000  . CHEST TUBE INSERTION Right 10/19/2012   post bronch  . COLONOSCOPY W/ POLYPECTOMY    . FLEXIBLE BRONCHOSCOPY  10/19/2012   Flexible video fiberoptic bronchoscopy with electromagnetic navigation and biopsies.  . INTRAVASCULAR PRESSURE WIRE/FFR STUDY N/A 08/11/2016   Procedure: Intravascular Pressure Wire/FFR Study;  Surgeon: [name], MD;  Location: [facility];  Service: Cardiovascular;  Laterality: N/A;  . RIGHT/LEFT HEART CATH AND CORONARY ANGIOGRAPHY N/A 08/11/2016   Procedure: Right/Left Heart Cath and Coronary Angiography;  Surgeon: [name], MD;  Location: [facility];  Service: Cardiovascular;  Laterality: N/A;  . TEE WITHOUT CARDIOVERSION N/A 07/17/2016   Procedure: TRANSESOPHAGEAL ECHOCARDIOGRAM (TEE);  Surgeon: [name], MD;  Location: [facility];  Service: Cardiovascular;  Laterality: N/A;  . TONSILLECTOMY  1950's  . TOTAL HIP ARTHROPLASTY Left 10/05/2014   dr [name]  . TOTAL HIP ARTHROPLASTY Left 10/05/2014   Procedure: LEFT TOTAL HIP ARTHROPLASTY ANTERIOR APPROACH;  Surgeon: [name], MD;  Location: [facility];  Service: Orthopedics;  Laterality:  Left;  Marland Kitchen VIDEO BRONCHOSCOPY WITH ENDOBRONCHIAL NAVIGATION N/A 10/19/2012   Procedure: VIDEO BRONCHOSCOPY WITH ENDOBRONCHIAL NAVIGATION;  Surgeon: [name], MD;  Location: [facility];  Service: Thoracic;  Laterality: N/A;  . WRIST RECONSTRUCTION  12/2009   'proximal row carpectomy" [name]    Family History  Problem Relation Age of Onset  . Heart disease Father   . Heart attack Father   . Heart disease Paternal Uncle   . Prostate cancer Neg Hx   . Colon cancer Neg Hx   . Hypertension Neg Hx   . Hyperlipidemia Neg Hx   . Diabetes Neg Hx     Social History   Social History  . Marital status: Married    Spouse name: N/A  . Number of children: 3  . Years of education: N/A   Occupational History  . Retired, Financial risk analyst   . Retired, Real estate     Slow   Social History Main Topics  . Smoking status: Never Smoker  . Smokeless tobacco: Never Used  . Alcohol use 0.6 oz/week    1 Cans of beer per week  . Drug use: No  . Sexual activity: No   Other Topics Concern  . Not on file   Social History  Narrative   HSG, [employer]. Married '73.  2 sons - '74,   '76;  1 daughter -  '77  6 grandchildren.   Work - IT consultant now retired. ACP - not fully discussed.                    Current Outpatient Prescriptions  Medication Sig Dispense Refill  . aspirin EC 81 MG tablet Take 1 tablet (81 mg total) by mouth daily. 90 tablet 3  . Multiple Vitamin (MULTIVITAMIN) tablet Take 1 tablet by mouth daily.    . Omega-3 Fatty Acids (FISH OIL) 500 MG CAPS Take 500 mg by mouth.    Marland Kitchen atorvastatin (LIPITOR) 20 MG tablet Take 1 tablet (20 mg total) by mouth daily. (Patient not taking: Reported on 08/21/2016) 90 tablet 3   No current facility-administered medications for this visit.     Allergies  Allergen Reactions  . Amoxicillin Itching      Review of Systems:   General:  normal appetite, normal energy, no weight gain, no weight loss, no  fever  Cardiac:  no chest pain with exertion, occasional brief twinges of chest pain at rest, mild SOB with more strenuous exertion, no resting SOB, no PND, no orthopnea, no palpitations, no arrhythmia, no atrial fibrillation, no LE edema, no dizzy spells, no syncope  Respiratory:  no shortness of breath, no home oxygen, no productive cough, no dry cough, no bronchitis, no wheezing, no hemoptysis, no asthma, no pain with inspiration or cough, + sleep apnea, + CPAP at night  GI:   no difficulty swallowing, no reflux, no frequent heartburn, no hiatal hernia, no abdominal pain, no constipation, no diarrhea, no hematochezia, no hematemesis, no melena  GU:   no dysuria,  no frequency, no urinary tract infection, no hematuria, + enlarged prostate, no kidney stones, no kidney disease  Vascular:  no pain suggestive of claudication, no pain in feet, no leg cramps, no varicose veins, no DVT, no non-healing foot ulcer  Neuro:   no stroke, no TIA's, no seizures, no headaches, no temporary blindness one eye,  no slurred speech, no peripheral neuropathy, no chronic pain, no instability of gait, no memory/cognitive dysfunction  Musculoskeletal: + arthritis primarily in left knee, no joint swelling, no myalgias, no difficulty walking, normal mobility   Skin:   no rash, no itching, no skin infections, no pressure sores or ulcerations  Psych:   no anxiety, no depression, no nervousness, + unusual recent stress due to diagnosis  Eyes:   no blurry vision, no floaters, no recent vision changes, does not wear glasses or contacts  ENT:   no hearing loss, no loose or painful teeth, no dentures, last saw dentist March 2016  Hematologic:  no easy bruising, no abnormal bleeding, no clotting disorder, no frequent epistaxis  Endocrine:  no diabetes, does not check CBG's at home     Physical Exam:   BP (!) 142/75 (BP Location: Right Arm, Patient Position: Sitting, Cuff Size: Large)   Pulse 65   Resp 16   Ht '5\' 9"'$  (1.753 m)    Wt 228 lb (103.4 kg)   SpO2 95% Comment: ON RA  BMI 33.67 kg/m   General:  Mildly obese,  well-appearing  HEENT:  Unremarkable   Neck:   no JVD, no bruits, no adenopathy   Chest:   clear to auscultation, symmetrical breath sounds, no wheezes, no rhonchi   CV:   RRR, grade IV/VI holosystolic murmur best at  apex  Abdomen:  soft, non-tender, no masses   Extremities:  warm, well-perfused, pulses palpable, no LE edema  Rectal/GU  Deferred  Neuro:   Grossly non-focal and symmetrical throughout  Skin:   Clean and dry, no rashes, no breakdown   Diagnostic Tests:  Transthoracic Echocardiography  Patient:    Ashraf, Mesta MR #:       [id number] Study Date: 07/01/2016 Gender:     M Age:        44 Height:     175.3 cm Weight:     107.3 kg BSA:        2.32 m^2 Pt. Status: Room:   ATTENDING    [facility]  REFERRING    [name]  SONOGRAPHER  Wyatt Mage, RDCS  PERFORMING   [facility] Outpatient  cc:  ------------------------------------------------------------------- LV EF: 60% -   65%  ------------------------------------------------------------------- Indications:      Murmur (R01.1).  ------------------------------------------------------------------- History:   Risk factors:  Venous insufficiency. OSA on CPAP. Diabetes mellitus. Obese. Dyslipidemia.  ------------------------------------------------------------------- Study Conclusions  - Left ventricle: The cavity size was mildly dilated. There was   mild concentric hypertrophy. Systolic function was normal. The   estimated ejection fraction was in the range of 60% to 65%. Wall   motion was normal; there were no regional wall motion   abnormalities. Left ventricular diastolic function parameters   were normal. - Aortic valve: Transvalvular velocity was within the normal range.   There was no stenosis. There was no regurgitation. - Mitral valve: Transvalvular velocity was  within the normal range.   There was no evidence for stenosis. There was mild regurgitation. - Left atrium: The atrium was moderately dilated. - Right ventricle: The cavity size was normal. Wall thickness was   normal. Systolic function was normal. - Atrial septum: No defect or patent foramen ovale was identified   by color flow Doppler. - Tricuspid valve: There was trivial regurgitation. - Pulmonary arteries: Systolic pressure was within the normal   range. PA peak pressure: 24 mm Hg (S).  ------------------------------------------------------------------- Study data:  No prior study was available for comparison.  Study status:  Routine.  Procedure:  The patient reported no pain pre or post test. Transthoracic echocardiography. Image quality was adequate.  Study completion:  There were no complications. Transthoracic echocardiography.  M-mode, complete 2D, 3D, spectral Doppler, and color Doppler.  Birthdate:  Patient birthdate: [birth year].  Age:  Patient is 66 yr old.  Sex:  Gender: male. BMI: 34.9 kg/m^2.  Blood pressure:     146/79  Patient status: Outpatient.  Study date:  Study date: 07/01/2016. Study time: 12:59 PM.  Location:  [facility]  -------------------------------------------------------------------  ------------------------------------------------------------------- Left ventricle:  The cavity size was mildly dilated. There was mild concentric hypertrophy. Systolic function was normal. The estimated ejection fraction was in the range of 60% to 65%. Wall motion was normal; there were no regional wall motion abnormalities. The transmitral flow pattern was normal. The deceleration time of the early transmitral flow velocity was normal. The pulmonary vein flow pattern was normal. The tissue Doppler parameters were normal. Left ventricular diastolic function parameters were normal.  ------------------------------------------------------------------- Aortic  valve:   Trileaflet; mildly thickened, mildly calcified leaflets. Mobility was not restricted.  Doppler:  Transvalvular velocity was within the normal range. There was no stenosis. There was no regurgitation.  ------------------------------------------------------------------- Aorta:  Aortic root: The aortic root was normal in size.  -------------------------------------------------------------------  Mitral valve:   Structurally normal valve.   Mobility was not restricted.  Doppler:  Transvalvular velocity was within the normal range. There was no evidence for stenosis. There was mild regurgitation.    Peak gradient (D): 5 mm Hg.  ------------------------------------------------------------------- Left atrium:  The atrium was moderately dilated.  ------------------------------------------------------------------- Atrial septum:  No defect or patent foramen ovale was identified by color flow Doppler.  ------------------------------------------------------------------- Right ventricle:  The cavity size was normal. Wall thickness was normal. Systolic function was normal.  ------------------------------------------------------------------- Pulmonic valve:    Structurally normal valve.   Cusp separation was normal.  Doppler:  Transvalvular velocity was within the normal range. There was no evidence for stenosis. There was no regurgitation.  ------------------------------------------------------------------- Tricuspid valve:   Structurally normal valve.    Doppler: Transvalvular velocity was within the normal range. There was trivial regurgitation.  ------------------------------------------------------------------- Pulmonary artery:   The main pulmonary artery was normal-sized. Systolic pressure was within the normal range.  ------------------------------------------------------------------- Right atrium:  The atrium was normal in  size.  ------------------------------------------------------------------- Pericardium:  There was no pericardial effusion.  ------------------------------------------------------------------- Systemic veins: Inferior vena cava: The vessel was normal in size. The respirophasic diameter changes were in the normal range (>= 50%), consistent with normal central venous pressure.  ------------------------------------------------------------------- Measurements   Left ventricle                             Value         Reference  LV ID, ED, PLAX chordal            (H)     57.3   mm     43 - 52  LV ID, ES, PLAX chordal                    34.3   mm     23 - 38  LV fx shortening, PLAX chordal             40     %      >=29  LV PW thickness, ED                        10.7   mm     ---------  IVS/LV PW ratio, ED                        1.01          <=1.3  Stroke volume, 2D                          69     ml     ---------  Stroke volume/bsa, 2D                      30     ml/m^2 ---------  LV e&', lateral                             16.6   cm/s   ---------  LV E/e&', lateral                           6.63          ---------  LV e&', medial  11.3   cm/s   ---------  LV E/e&', medial                            9.73          ---------  LV e&', average                             13.95  cm/s   ---------  LV E/e&', average                           7.89          ---------    Ventricular septum                         Value         Reference  IVS thickness, ED                          10.8   mm     ---------    LVOT                                       Value         Reference  LVOT ID, S                                 20     mm     ---------  LVOT area                                  3.14   cm^2   ---------  LVOT peak velocity, S                      118    cm/s   ---------  LVOT mean velocity, S                      70.2   cm/s   ---------  LVOT VTI, S                                 21.9   cm     ---------  LVOT peak gradient, S                      6      mm Hg  ---------    Aorta                                      Value         Reference  Aortic root ID, ED                         35     mm     ---------  Ascending aorta ID, A-P, S  36     mm     ---------    Left atrium                                Value         Reference  LA ID, A-P, ES                             46     mm     ---------  LA ID/bsa, A-P                             1.98   cm/m^2 <=2.2  LA volume, S                               76.5   ml     ---------  LA volume/bsa, S                           32.9   ml/m^2 ---------  LA volume, ES, 1-p A4C                     81.8   ml     ---------  LA volume/bsa, ES, 1-p A4C                 35.2   ml/m^2 ---------  LA volume, ES, 1-p A2C                     71.2   ml     ---------  LA volume/bsa, ES, 1-p A2C                 30.6   ml/m^2 ---------    Mitral valve                               Value         Reference  Mitral E-wave peak velocity                110    cm/s   ---------  Mitral A-wave peak velocity                58.1   cm/s   ---------  Mitral deceleration time                   169    ms     150 - 230  Mitral peak gradient, D                    5      mm Hg  ---------  Mitral E/A ratio, peak                     1.9           ---------    Pulmonary arteries                         Value         Reference  PA pressure, S, DP  24     mm Hg  <=30    Tricuspid valve                            Value         Reference  Tricuspid regurg peak velocity             228.67 cm/s   ---------  Tricuspid peak RV-RA gradient              21     mm Hg  ---------  Tricuspid maximal regurg velocity,         228.67 cm/s   ---------  PISA    Systemic veins                             Value         Reference  Estimated CVP                              3      mm Hg  ---------    Right ventricle                             Value         Reference  TAPSE                                      31     mm     ---------  RV pressure, S, DP                         24     mm Hg  <=30  RV s&', lateral, S                          19     cm/s   ---------  Legend: (L)  and  (H)  mark values outside specified reference range.  ------------------------------------------------------------------- Prepared and Electronically Authenticated by  Skeet Latch, MD 2018-02-21T18:47:26    Transesophageal Echocardiography  Patient:    Abad, Manard MR #:       [id number] Study Date: 07/17/2016 Gender:     M Age:        62 Height:     175.3 cm Weight:     105.9 kg BSA:        2.31 m^2 Pt. Status: Room:   ORDERING     Peter Martinique, M.D.  SONOGRAPHER  Florentina Jenny, RDCS  ADMITTING    Lyman Bishop MD  [place] MD  PERFORMING   Lyman Bishop MD  cc:  ------------------------------------------------------------------- LV EF: 60% -   65%  ------------------------------------------------------------------- Indications:      Mitral regurgitation 424.0.  ------------------------------------------------------------------- Study Conclusions  - Left ventricle: The cavity size was normal. There was mild   concentric hypertrophy. Systolic function was normal. The   estimated ejection fraction was in the range of 60% to 65%. Wall   motion was normal; there were no regional wall motion   abnormalities. - Aortic valve: No evidence of vegetation. - Mitral valve: There is a partially flail P3 segment due  to   ruptured cord, seen prolapsing in both 2D and 3D images. There is   severe, posterolaterally directed mitral regurgitation which   swirls into the left atrial appendage. - Left atrium: The atrium was dilated. - Right atrium: No evidence of thrombus in the atrial cavity or   appendage. - Atrial septum: No defect or patent foramen ovale was  identified.  Impressions:  - Flail P3 segment of the mitral leaflet with a rupture cord and   severe, posterolaterally directed MR which swirls in the LAA.   LVEF 60-65%.  ------------------------------------------------------------------- Study data:   Study status:  Routine.  Consent:  The risks, benefits, and alternatives to the procedure were explained to the patient and informed consent was obtained.  Procedure:  The patient reported no pain pre or post test. Initial setup. The patient was brought to the laboratory. Surface ECG leads were monitored. Sedation. Conscious sedation was administered by cardiology staff. Transesophageal echocardiography. Topical anesthesia was obtained using viscous lidocaine. An adult multiplane transesophageal probe was inserted by the attending cardiologistwithout difficulty. Image quality was adequate.  Study completion:  The patient tolerated the procedure well. There were no complications.  Administered medications:   Fentanyl, 62.33mg, IV.  Midazolam, '5mg'$ , IV. Diagnostic transesophageal echocardiography.  2D and color Doppler.  Birthdate:  Patient birthdate: [birth year]  Age:  Patient is 66yr old.  Sex:  Gender: male.    BMI: 34.5 kg/m^2.  Blood pressure: 139/96  Patient status:  Outpatient.  Study date:  Study date: 07/17/2016. Study time: 10:22 AM.  Location:  Endoscopy.  -------------------------------------------------------------------  ------------------------------------------------------------------- Left ventricle:  The cavity size was normal. There was mild concentric hypertrophy. Systolic function was normal. The estimated ejection fraction was in the range of 60% to 65%. Wall motion was normal; there were no regional wall motion abnormalities.  ------------------------------------------------------------------- Aortic valve:   Structurally normal valve. Trileaflet. Cusp separation was normal.  No evidence of vegetation.   Doppler:  There was no regurgitation.  ------------------------------------------------------------------- Aorta:  The aorta was normal, not dilated, and non-diseased.  ------------------------------------------------------------------- Mitral valve:  There is a partially flail P3 segment due to ruptured cord, seen prolapsing in both 2D and 3D images. There is severe, posterolaterally directed mitral regurgitation which swirls into the left atrial appendage.  ------------------------------------------------------------------- Left atrium:  The atrium was dilated.  ------------------------------------------------------------------- Atrial septum:  No defect or patent foramen ovale was identified.   ------------------------------------------------------------------- Pulmonary veins:  No anomaly.  ------------------------------------------------------------------- Right ventricle:  The cavity size was normal. Wall thickness was normal. Systolic function was normal.  ------------------------------------------------------------------- Pulmonic valve:    Doppler:  There was mild regurgitation.  ------------------------------------------------------------------- Tricuspid valve:   Doppler:  There was trivial regurgitation.   ------------------------------------------------------------------- Right atrium:  The atrium was normal in size.  No evidence of thrombus in the atrial cavity or appendage.  ------------------------------------------------------------------- Pericardium:  There was no pericardial effusion.   ------------------------------------------------------------------- Post procedure conclusions Ascending Aorta:  - The aorta was normal, not dilated, and non-diseased.  ------------------------------------------------------------------- Measurements   Mitral valve                              Value  Mitral maximal regurg velocity, PISA      599   cm/s   Mitral regurg VTI, PISA                   183   cm  Mitral ERO, PISA  0.33  cm^2  Mitral regurg volume, PISA                60    ml  Legend: (L)  and  (H)  mark values outside specified reference range.  ------------------------------------------------------------------- Prepared and Electronically Authenticated by  Lyman Bishop MD 2018-03-09T16:16:10    Intravascular Pressure Wire/FFR Study  Right/Left Heart Cath and Coronary Angiography  Conclusion     Prox LAD to Mid LAD lesion, 30 %stenosed.  Mid LAD to Dist LAD lesion, 65 %stenosed.  Prox RCA to Mid RCA lesion, 15 %stenosed.  The left ventricular systolic function is normal.  LV end diastolic pressure is normal.  The left ventricular ejection fraction is 55-65% by visual estimate.  LV end diastolic pressure is normal.   1. Borderline single vessel obstructive CAD involving the mid LAD. FFR 0.81 2. Normal LV function EF 65% 3. Normal right heart and LV filling pressures 4. Normal Cardiac output  Plan: will refer to CT surgery for consideration of MV repair. The stenosis in the LAD will be discussed. He is asymptomatic and I would favor treating it medically. If he develops symptoms in the future it could be treated with PCI.    Indications   Non-rheumatic mitral regurgitation [I34.0 (ICD-10-CM)]  Procedural Details/Technique   Technical Details Indication: 66 yo WM with newly diagnosed murmur. TEE reveals flail P3 segment of the posterior MV leaflet with severe MR. [facility] indicated for surgical evaluation.  Procedural Details: The right wrist was prepped, draped, and anesthetized with 1% lidocaine. Using the modified Seldinger technique a 6 Fr slender sheath was placed in the right radial artery and a 5 French sheath was placed in the right brachial vein. A Swan-Ganz catheter was used for the right heart catheterization. Standard protocol was followed for recording of right heart  pressures and sampling of oxygen saturations. Fick cardiac output was calculated. Standard Judkins catheters were used for selective coronary angiography and left ventriculography. Following the diagnostic procedure we proceeded with FFR analysis of the LAD. The patient was fully anticoagulated with IV heparin. The LCA was engaged with a 5 Fr EBU 3.5 guide. A Comet wire was used to measure FFR using maximal hyperemia with IV adenosine. FFR was 0.91 at rest and 0.81 with IV adenosine. There were no immediate procedural complications. The patient was transferred to the post catheterization recovery area for further monitoring.  Contrast: 110 cc   Estimated blood loss <50 mL.  During this procedure the patient was administered the following to achieve and maintain moderate conscious sedation: Versed 4 mg, Fentanyl 75 mcg, while the patient's heart rate, blood pressure, and oxygen saturation were continuously monitored. The period of conscious sedation was 70 minutes, of which I was present face-to-face 100% of this time.    Complications   Complications documented before study signed (08/11/2016 9:45 AM EDT)    No complications were associated with this study.  Documented by [name], MD - 08/11/2016 9:41 AM EDT    Coronary Findings   Dominance: Right  Left Main  Vessel was injected. Vessel is normal in caliber. Vessel is angiographically normal.  Left Anterior Descending  Prox LAD to Mid LAD lesion, 30% stenosed.  Mid LAD to Dist LAD lesion, 65% stenosed. Pressure wire/FFR was performed on the lesion. FFR: 0.81.  Right Coronary Artery  Prox RCA to Mid RCA lesion, 15% stenosed.  Right Heart   Right Heart Pressures LV EDP is normal.    Wall Motion  Left Heart   Left Ventricle The left ventricular size is normal. The left ventricular systolic function is normal. LV end diastolic pressure is normal. The left ventricular ejection fraction is 55-65% by visual estimate.  No regional wall motion abnormalities.    Coronary Diagrams   Diagnostic Diagram       Implants     No implant documentation for this case.  PACS Images   Show images for Cardiac catheterization   Link to Procedure Log   Procedure Log    Hemo Data    Most Recent Value  Fick Cardiac Output 6.11 L/min  Fick Cardiac Output Index 2.78 (L/min)/BSA  RA A Wave 12 mmHg  RA V Wave 8 mmHg  RA Mean 6 mmHg  RV Systolic Pressure 36 mmHg  RV Diastolic Pressure 4 mmHg  RV EDP 7 mmHg  PA Systolic Pressure 37 mmHg  PA Diastolic Pressure 12 mmHg  PA Mean 25 mmHg  PW A Wave 18 mmHg  PW V Wave 20 mmHg  PW Mean 14 mmHg  AO Systolic Pressure 093 mmHg  AO Diastolic Pressure 64 mmHg  AO Mean 81 mmHg  LV Systolic Pressure 235 mmHg  LV Diastolic Pressure 9 mmHg  LV EDP 19 mmHg  Arterial Occlusion Pressure Extended Systolic Pressure 573 mmHg  Arterial Occlusion Pressure Extended Diastolic Pressure 66 mmHg  Arterial Occlusion Pressure Extended Mean Pressure 87 mmHg  Left Ventricular Apex Extended Systolic Pressure 220 mmHg  Left Ventricular Apex Extended Diastolic Pressure 6 mmHg  Left Ventricular Apex Extended EDP Pressure 17 mmHg  QP/QS 1  TPVR Index 9 HRUI  TSVR Index 29.15 HRUI  PVR SVR Ratio 0.15  TPVR/TSVR Ratio 0.31     CT CHEST WITH CONTRAST  TECHNIQUE: Multidetector CT imaging of the chest was performed during intravenous contrast administration.  CONTRAST:  80 cc Isovue-300 IV.  COMPARISON:  11/10/2012 chest radiograph.   09/12/2012 chest CT.  FINDINGS: Cardiovascular: Normal heart size. No significant pericardial fluid/thickening. Left anterior descending coronary atherosclerosis. Atherosclerotic nonaneurysmal thoracic aorta. Normal caliber pulmonary arteries. No acute central pulmonary emboli. Stable attenuated segmental left lower lobe pulmonary artery branch with curvilinear internal filling defects (series 3/image 88), unchanged since 09/12/2012,  compatible with chronic pulmonary embolism.  Mediastinum/Nodes: No discrete thyroid nodules. Unremarkable esophagus. No pathologically enlarged axillary, mediastinal or hilar lymph nodes.  Lungs/Pleura: No pneumothorax. No pleural effusion. Irregular solid 2.5 x 1.8 cm anterior right upper lobe pulmonary nodule with spiculations extending to the anterior and mediastinal pleural surface (series 4/ image 50), increased in size and density from 09/12/2012 where it measured 2.0 x 1.3 cm using similar measurement technique. Subpleural 4 mm posterior right upper lobe pulmonary nodule (series 4/ image 79) and peripheral right lower lobe 5 mm pulmonary nodule (series 4/ image 120) are stable since 09/12/2012 and considered benign. Stable scattered calcified tiny 2-3 mm granulomas throughout both lungs. New small focus of tree-in-bud opacity in the medial left upper lobe (series 4/ image 48). No acute consolidative airspace disease or additional significant pulmonary nodules.  Upper abdomen: Simple 4.8 cm inferior right liver lobe and 2.5 cm lateral segment left liver lobe cysts are stable. Partially visualized parapelvic renal cysts in the left kidney.  Musculoskeletal: No aggressive appearing focal osseous lesions. Moderate thoracic spondylosis.  IMPRESSION: 1. Irregular solid 2.5 cm anterior right upper lobe pulmonary nodule, increased in size and density since 09/12/2012 chest CT, most suggestive of primary bronchogenic adenocarcinoma. Multidisciplinary thoracic oncology consultation advised. 2. No thoracic lymphadenopathy or other  findings of metastatic disease in the chest. 3. Aortic atherosclerosis.  One vessel coronary atherosclerosis. 4. Mild tree-in-bud opacities in the medial left upper lobe are new and most suggestive of a nonspecific mild infectious or inflammatory bronchiolitis. 5. Stable findings suggestive of chronic pulmonary embolism within a segmental left lower  lobe pulmonary artery branch.   Electronically Signed   By: Ilona Sorrel M.D.   On: 08/05/2016 16:03  CT NECK WITH CONTRAST  TECHNIQUE: Multidetector CT imaging of the neck was performed using the standard protocol following the bolus administration of intravenous contrast.  CONTRAST:  66m ISOVUE-300 IOPAMIDOL (ISOVUE-300) INJECTION 61%  COMPARISON:  05/18/2005  FINDINGS: Pharynx and larynx: No mucosal or submucosal lesion.  Salivary glands: The parotid and submandibular glands are normal.  Thyroid: Normal  Lymph nodes: No enlarged or low-density nodes seen on either side of the neck. Abundant supraclavicular fat.  Vascular: Normal  Limited intracranial: Normal  Visualized orbits: Normal  Mastoids and visualized paranasal sinuses: Normal  Skeleton: Ordinary cervical spondylosis.  Upper chest: See results of chest exam.  Other: Negative neck CT. No evidence of mass or lymphadenopathy. Normal appearing lymph nodes.   Electronically Signed   By: MNelson ChimesM.D.   On: 08/05/2016 15:23    NUCLEAR MEDICINE PET SKULL BASE TO THIGH  TECHNIQUE: 11.3 mCi F-18 FDG was injected intravenously. Full-ring PET imaging was performed from the skull base to thigh after the radiotracer. CT data was obtained and used for attenuation correction and anatomic localization.  FASTING BLOOD GLUCOSE:  Value: 120 mg/dl  COMPARISON:  08/05/2016 chest CT.  FINDINGS: NECK  No hypermetabolic lymph nodes in the neck.  Nonspecific mild asymmetric hypermetabolism in the left palatine tonsil/left tongue base region, without discrete CT correlate.  CHEST  Left anterior descending coronary atherosclerosis. Atherosclerotic nonaneurysmal thoracic aorta. No hypermetabolic or enlarged axillary, mediastinal or hilar lymph nodes. No pneumothorax. No pleural effusions.  Hypermetabolic solid 2.6 x 1.9 cm anterior right upper lobe pulmonary nodule (series  7/image 17) with max SUV 7.7.  Right lower lobe 4 mm solid pulmonary nodule (series 7/image 38), below PET resolution, stable since 09/12/2012, considered benign. No acute consolidative airspace disease or additional significant pulmonary nodules.  ABDOMEN/PELVIS  No abnormal hypermetabolic activity within the liver, pancreas, adrenal glands, or spleen. No hypermetabolic lymph nodes in the abdomen or pelvis. Two simple liver cysts, largest 4.8 cm in the posterior right liver lobe. Simple parapelvic renal cysts in the left kidney. No hydronephrosis. Simple appearing 2.9 cm renal cortical cyst in the lower left kidney. Atherosclerotic nonaneurysmal abdominal aorta. Mildly enlarged prostate with nonspecific internal prostatic calcifications. Mild sigmoid diverticulosis.  SKELETON  No focal hypermetabolic activity to suggest skeletal metastasis. Left total hip arthroplasty.  IMPRESSION: 1. Hypermetabolic solid 2.6 cm anterior right upper lobe pulmonary nodule, compatible with prior bronchogenic carcinoma. 2. No hypermetabolic thoracic lymphadenopathy or distant metastatic disease. 3. Nonspecific mild asymmetric hypermetabolism in the left palatine tonsil/left tongue base region, without discrete CT correlate. Recommend correlation with direct visualization. 4. Additional findings include aortic atherosclerosis, coronary atherosclerosis, mild sigmoid diverticulosis and mildly enlarged prostate.   Electronically Signed   By: JIlona SorrelM.D.   On: 08/17/2016 09:56   Pulmonary Function Tests  Baseline        FVC  3.86 L  (88% predicted)  FEV1  2.91 L  (90% predicted)  FEF25-75 2.33 L  (92% predicted)      Impression:  Patient has an enlarging 2.6 cm mass in the  right upper lobe that is hypermetabolic on PET imaging and consistent with likely primary lung cancer, clinical stage Ia.  Coincidentally he has recently been found to have early stage D severe  symptomatic primary mitral regurgitation caused by mitral valve prolapse with very mild symptoms of exertional shortness of breath that occur with only mild physical activity.  I have personally reviewed the patient's recent transthoracic and transesophageal echocardiograms, diagnostic cardiac catheterization, CT scans, and nuclear medicine PET scans.  Transthoracic and transesophageal echocardiograms demonstrate normal left ventricular systolic function with essentially normal left ventricular size. 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 Diagnostic cardiac catheterization is notable for the presence of long segment 65% stenosis of left anterior descending coronary artery that was borderline significant on FFR flow analysis.  Pulmonary artery pressures were not elevated. The patient needs elective mitral valve repair at some point. Whether or not he should undergo surgical revascularization of his single-vessel coronary artery disease could be debated.  The patient also needs right upper lobectomy. The size of the mass and its location would  probably not be suitable for wedge resection and full right upper lobectomy should be performed.  Under the circumstances I would be very reluctant to consider proceeding with right upper lobectomy and mitral valve repair as concomitant procedures regardless of the surgical approach.   I favor proceeding with right upper lobectomy prior to mitral valve repair in effort to avoid any further delay in definitive management of the patient's underlying lung cancer. The patient will be at increased risk for postoperative respiratory failure, congestive heart failure, and atrial fibrillation following right upper lobectomy given the presence of severe  mitral regurgitation. However, these complications can be managed medically, particularly given the fact that the patient's underlying baseline pulmonary function is fairly good.   Plan:  The patient and his wife were counseled at length regarding the indications, risks and potential benefits of mitral valve repair.  The rationale for elective surgery has been explained, including a comparison between surgery and continued medical therapy with close follow-up.  The likelihood of successful and durable valve repair has been discussed with particular reference to the findings of their recent echocardiogram.  Based upon these findings and previous experience, I have quoted them a greater than 95 percent likelihood of successful valve repair.  We also discussed at length the indications, risks, and potential benefits of right upper lobectomy and why I would not favor attempting to perform mitral valve repair and right upper lobectomy at the same time.  We discussed options as to whether or not to proceed with right upper lobectomy first and mitral valve repair with or without coronary artery bypass grafting at a delayed date versus proceeding with mitral valve repair followed by right upper lobectomy had a delayed date. We discussed the reasons why I would favor right upper lobectomy first in effort to avoid any further delay in definitive management of the patient's lung cancer. All of their questions have been addressed. The patient's case has been discussed with Dr. [name] who will see the patient in consultation for a second opinion and potentially proceed with right upper lobectomy in the near future.  The case has also been discussed over the telephone with [name] who agrees with the plans to proceed with right upper lobectomy prior  to mitral valve repair.   I spent in excess of 90 minutes during the conduct of this office consultation and >50% of this time involved direct face-to-face encounter  with the patient for counseling and/or coordination of their care.    Valentina Gu. Roxy Manns, MD 08/21/2016 1:26 PM

## 2016-08-21 NOTE — Patient Instructions (Signed)
Continue all previous medications without any changes at this time  

## 2016-08-24 ENCOUNTER — Institutional Professional Consult (permissible substitution) (INDEPENDENT_AMBULATORY_CARE_PROVIDER_SITE_OTHER): Payer: Medicare Other | Admitting: Thoracic Surgery (Cardiothoracic Vascular Surgery)

## 2016-08-24 ENCOUNTER — Encounter: Payer: Self-pay | Admitting: Thoracic Surgery (Cardiothoracic Vascular Surgery)

## 2016-08-24 ENCOUNTER — Other Ambulatory Visit: Payer: Self-pay | Admitting: *Deleted

## 2016-08-24 ENCOUNTER — Ambulatory Visit (INDEPENDENT_AMBULATORY_CARE_PROVIDER_SITE_OTHER): Payer: Medicare Other | Admitting: Physician Assistant

## 2016-08-24 ENCOUNTER — Ambulatory Visit: Payer: Medicare Other | Admitting: Cardiology

## 2016-08-24 ENCOUNTER — Encounter: Payer: Self-pay | Admitting: Physician Assistant

## 2016-08-24 VITALS — BP 133/75 | HR 83 | Ht 69.0 in | Wt 232.6 lb

## 2016-08-24 VITALS — BP 140/79 | HR 66 | Resp 20 | Ht 69.0 in | Wt 232.0 lb

## 2016-08-24 DIAGNOSIS — I340 Nonrheumatic mitral (valve) insufficiency: Secondary | ICD-10-CM | POA: Diagnosis not present

## 2016-08-24 DIAGNOSIS — E119 Type 2 diabetes mellitus without complications: Secondary | ICD-10-CM

## 2016-08-24 DIAGNOSIS — C3411 Malignant neoplasm of upper lobe, right bronchus or lung: Secondary | ICD-10-CM

## 2016-08-24 DIAGNOSIS — G4733 Obstructive sleep apnea (adult) (pediatric): Secondary | ICD-10-CM | POA: Diagnosis not present

## 2016-08-24 DIAGNOSIS — Z9989 Dependence on other enabling machines and devices: Secondary | ICD-10-CM | POA: Diagnosis not present

## 2016-08-24 DIAGNOSIS — R911 Solitary pulmonary nodule: Secondary | ICD-10-CM

## 2016-08-24 DIAGNOSIS — E785 Hyperlipidemia, unspecified: Secondary | ICD-10-CM

## 2016-08-24 DIAGNOSIS — I2510 Atherosclerotic heart disease of native coronary artery without angina pectoris: Secondary | ICD-10-CM

## 2016-08-24 NOTE — Progress Notes (Signed)
Cardiology Office Note    Date:  08/25/2016   ID:  James [name], DOB 12-16-1950, MRN [id number]  PCP:  James Calico, MD  Cardiologist:  [name]   Chief Complaint  Patient presents with  . Follow-up    Pt states no Sx. Seen for [name]    History of Present Illness:  James [name] is a 66 y.o. male with PMH of DM II, HLD, OSA on CPAP, and MR. He was recently diagnosed with a heart murmur. Review of his echocardiogram suggested a partial flail leaflet. Previous echo showed graded MR is mild, however this was felt to be underestimated. A TEE performed on 07/17/2016 showed flail 33 segment of the mitral leaflet with a ruptured cord and the severe posterolateral directed MR, EF 60-65%. On follow-up, patient was complaining of mild dyspnea with exertion. Cardiac catheterization performed on 08/11/2016 shows 65% mid to distal LAD lesion, EF 65%, normal RV and LV filling pressure, normal cardiac output. It was also noted to have a right upper lobe nodule seen on the previous CT, he has been seen by [name] was pulmonology service, PTT study obtained on 08/17/2016 showed hypermetabolic solid 2.6 cm anterior right upper lobe pulmonary nodule compatible with prior bronchogenic, no hypermetabolic thoracic lymphadenopathy or distal metastatic disease. There were nonspecific and mild asymmetric hypermetabolism in the left palatine tonsil/left tongue base region.  He has a follow-up with Dr. [name] today for second opinion. Despite being diagnosed with diabetes, he is started controlled for now. Otherwise he denies any obvious chest discomfort. He is pending final decision by cardiothoracic surgery team whether or not to pursue lobectomy and MVR +/- CABG. unclear significance of mild as symmetric hypermetabolism in the left palatine tonsil.    Past Medical History:  Diagnosis Date  . Arthritis    "knees, hips, back" (10/19/2012)  . Chronic lower back pain   . Colon polyps    10/27/2002, repeat  letter 09/17/2007  . Depressive disorder, not elsewhere classified   . Diabetes mellitus without complication ([place])   . Fasting hyperglycemia   . GERD (gastroesophageal reflux disease)   . Hemoptysis    abnormal CT Chest 01/29/10 - ? new GG changes RUL > not viz on plain cxr 02/26/2010  . MPN (myeloproliferative neoplasm) ([place])    1st detected 06/04/1998  . Obesity   . OSA on CPAP   . Other and unspecified hyperlipidemia   . Positive PPD 1965   "non reactive in 2012" (10/19/2012)  . Routine general medical examination at a health care facility   . Special screening for malignant neoplasm of prostate   . Spinal stenosis, unspecified region other than cervical   . Wrist pain, left     Past Surgical History:  Procedure Laterality Date  . ANTERIOR CRUCIATE LIGAMENT REPAIR Left 1967  . CARDIAC CATHETERIZATION  2000  . CHEST TUBE INSERTION Right 10/19/2012   post bronch  . COLONOSCOPY W/ POLYPECTOMY    . FLEXIBLE BRONCHOSCOPY  10/19/2012   Flexible video fiberoptic bronchoscopy with electromagnetic navigation and biopsies.  . INTRAVASCULAR PRESSURE WIRE/FFR STUDY N/A 08/11/2016   Procedure: Intravascular Pressure Wire/FFR Study;  Surgeon: [name], MD;  Location: [facility];  Service: Cardiovascular;  Laterality: N/A;  . RIGHT/LEFT HEART CATH AND CORONARY ANGIOGRAPHY N/A 08/11/2016   Procedure: Right/Left Heart Cath and Coronary Angiography;  Surgeon: [name], MD;  Location: [facility];  Service: Cardiovascular;  Laterality: N/A;  . TEE  WITHOUT CARDIOVERSION N/A 07/17/2016   Procedure: TRANSESOPHAGEAL ECHOCARDIOGRAM (TEE);  Surgeon: [name], MD;  Location: [facility];  Service: Cardiovascular;  Laterality: N/A;  . TONSILLECTOMY  1950's  . TOTAL HIP ARTHROPLASTY Left 10/05/2014   dr [name]  . TOTAL HIP ARTHROPLASTY Left 10/05/2014   Procedure: LEFT TOTAL HIP ARTHROPLASTY ANTERIOR APPROACH;  Surgeon: [name], MD;  Location: [facility];  Service: Orthopedics;   Laterality: Left;  Marland Kitchen VIDEO BRONCHOSCOPY WITH ENDOBRONCHIAL NAVIGATION N/A 10/19/2012   Procedure: VIDEO BRONCHOSCOPY WITH ENDOBRONCHIAL NAVIGATION;  Surgeon: [name], MD;  Location: [facility];  Service: Thoracic;  Laterality: N/A;  . WRIST RECONSTRUCTION  12/2009   'proximal row carpectomy" [name]    Current Medications: Outpatient Medications Prior to Visit  Medication Sig Dispense Refill  . aspirin EC 81 MG tablet Take 1 tablet (81 mg total) by mouth daily. 90 tablet 3  . atorvastatin (LIPITOR) 20 MG tablet Take 1 tablet (20 mg total) by mouth daily. (Patient not taking: Reported on 08/24/2016) 90 tablet 3  . Multiple Vitamin (MULTIVITAMIN) tablet Take 1 tablet by mouth daily.    . Omega-3 Fatty Acids (FISH OIL) 500 MG CAPS Take 500 mg by mouth.     No facility-administered medications prior to visit.      Allergies:   Amoxicillin   Social History   Social History  . Marital status: Married    Spouse name: N/A  . Number of children: 3  . Years of education: N/A   Occupational History  . Retired, Financial risk analyst   . Retired, Real estate     [employer]   Social History Main Topics  . Smoking status: Never Smoker  . Smokeless tobacco: Never Used  . Alcohol use 0.6 oz/week    1 Cans of beer per week  . Drug use: No  . Sexual activity: No   Other Topics Concern  . None   Social History Narrative   HSG, [employer]. Married '73.  2 sons - '74,   '76;  1 daughter -  '77  6 grandchildren.   Work - IT consultant now retired. ACP - not fully discussed.                     Family History:  The patient's family history includes Heart attack in his father; Heart disease in his father and paternal uncle.   ROS:   Please see the history of present illness.    ROS All other systems reviewed and are negative.   PHYSICAL EXAM:   VS:  BP 133/75   Pulse 83   Ht '5\' 9"'$  (1.753 m)   Wt 232 lb 9.6 oz (105.5 kg)   BMI 34.35 kg/m    GEN: Well nourished,  well developed, in no acute distress  HEENT: normal  Neck: no JVD, carotid bruits, or masses Cardiac: RRR; no rubs, or gallops,no edema  +2 systolic murmur Respiratory:  clear to auscultation bilaterally, normal work of breathing GI: soft, nontender, nondistended, + BS MS: no deformity or atrophy  Skin: warm and dry, no rash Neuro:  Alert and Oriented x 3, Strength and sensation are intact Psych: euthymic mood, full affect  Wt Readings from Last 3 Encounters:  08/24/16 232 lb (105.2 kg)  08/24/16 232 lb 9.6 oz (105.5 kg)  08/21/16 228 lb (103.4 kg)      Studies/Labs Reviewed:   EKG:  EKG is not ordered today.   Recent Labs: 06/29/2016:  ALT 27 07/27/2016: TSH 2.14 07/31/2016: BUN 18; Creat 1.15; Hemoglobin 16.7; Platelets 189; Potassium 4.7; Sodium 142   Lipid Panel    Component Value Date/Time   CHOL 245 (H) 06/29/2016 1153   TRIG 198.0 (H) 06/29/2016 1153   TRIG 108 05/20/2006 0825   HDL 35.90 (L) 06/29/2016 1153   CHOLHDL 7 06/29/2016 1153   VLDL 39.6 06/29/2016 1153   LDLCALC 170 (H) 06/29/2016 1153   LDLDIRECT 177.0 07/24/2015 1052    Additional studies/ records that were reviewed today include:   TTE 07/01/2016 LV EF: 60% -   65%  ------------------------------------------------------------------- Indications:      Murmur (R01.1).  ------------------------------------------------------------------- History:   Risk factors:  Venous insufficiency. OSA on CPAP. Diabetes mellitus. Obese. Dyslipidemia.  ------------------------------------------------------------------- Study Conclusions  - Left ventricle: The cavity size was mildly dilated. There was   mild concentric hypertrophy. Systolic function was normal. The   estimated ejection fraction was in the range of 60% to 65%. Wall   motion was normal; there were no regional wall motion   abnormalities. Left ventricular diastolic function parameters   were normal. - Aortic valve: Transvalvular velocity was  within the normal range.   There was no stenosis. There was no regurgitation. - Mitral valve: Transvalvular velocity was within the normal range.   There was no evidence for stenosis. There was mild regurgitation. - Left atrium: The atrium was moderately dilated. - Right ventricle: The cavity size was normal. Wall thickness was   normal. Systolic function was normal. - Atrial septum: No defect or patent foramen ovale was identified   by color flow Doppler. - Tricuspid valve: There was trivial regurgitation. - Pulmonary arteries: Systolic pressure was within the normal   range. PA peak pressure: 24 mm Hg (S).     TEE 07/17/2016  LV EF: 60% -   65%  - Left ventricle: The cavity size was normal. There was mild   concentric hypertrophy. Systolic function was normal. The   estimated ejection fraction was in the range of 60% to 65%. Wall   motion was normal; there were no regional wall motion   abnormalities. - Aortic valve: No evidence of vegetation. - Mitral valve: There is a partially flail P3 segment due to   ruptured cord, seen prolapsing in both 2D and 3D images. There is   severe, posterolaterally directed mitral regurgitation which   swirls into the left atrial appendage. - Left atrium: The atrium was dilated. - Right atrium: No evidence of thrombus in the atrial cavity or   appendage. - Atrial septum: No defect or patent foramen ovale was identified.  Impressions:  - Flail P3 segment of the mitral leaflet with a rupture cord and   severe, posterolaterally directed MR which swirls in the LAA.   LVEF 60-65%.   Cath 08/11/2016 Conclusion     Prox LAD to Mid LAD lesion, 30 %stenosed.  Mid LAD to Dist LAD lesion, 65 %stenosed.  Prox RCA to Mid RCA lesion, 15 %stenosed.  The left ventricular systolic function is normal.  LV end diastolic pressure is normal.  The left ventricular ejection fraction is 55-65% by visual estimate.  LV end diastolic pressure is  normal.   1. Borderline single vessel obstructive CAD involving the mid LAD. FFR 0.81 2. Normal LV function EF 65% 3. Normal right heart and LV filling pressures 4. Normal Cardiac output  Plan: will refer to CT surgery for consideration of MV repair. The stenosis in the LAD will be  discussed. He is asymptomatic and I would favor treating it medically. If he develops symptoms in the future it could be treated with PCI.     PET study 08/17/2016 No focal hypermetabolic activity to suggest skeletal metastasis. Left total hip arthroplasty.  IMPRESSION: 1. Hypermetabolic solid 2.6 cm anterior right upper lobe pulmonary nodule, compatible with prior bronchogenic carcinoma. 2. No hypermetabolic thoracic lymphadenopathy or distant metastatic disease. 3. Nonspecific mild asymmetric hypermetabolism in the left palatine tonsil/left tongue base region, without discrete CT correlate. Recommend correlation with direct visualization. 4. Additional findings include aortic atherosclerosis, coronary atherosclerosis, mild sigmoid diverticulosis and mildly enlarged prostate.   ASSESSMENT:    1. Severe mitral regurgitation   2. Hyperlipidemia, unspecified hyperlipidemia type   3. Controlled type 2 diabetes mellitus without complication, without long-term current use of insulin ([place])   4. OSA on CPAP   5. Malignant neoplasm of upper lobe of right lung ([place])   6. Coronary artery disease involving native coronary artery of native heart without angina pectoris      PLAN:  In order of problems listed above:  1. Severe MR: Undergoing evaluation by cardiothoracic surgery for potential MVR, this is complicated by the fact that he he has a hypermetabolic lesion in the right upper lobe likely represent adenocarcinoma of the lung. Based on Dr. Roxy Manns, likely require lobectomy prior to mitral valve surgery +/- CABG. He has upcoming appointment with Dr. [name] for second opinion.  2. CAD: Underwent recent  cardiac catheterization, most significant lesion involves a 65% mid to distal LAD. Will defer to cardiothoracic surgery to decide if wish to pursue CABG versus PCI  3. Pulmonary adenocarcinoma: Hypermetabolic lesion noted on EEG, also has asymmetric lesion in the left palatine of unknown significance. Under evaluation by pulmonology service  4. OSA on CPAP: compliant  5. HLD: On Lipitor    Medication Adjustments/Labs and Tests Ordered: Current medicines are reviewed at length with the patient today.  Concerns regarding medicines are outlined above.  Medication changes, Labs and Tests ordered today are listed in the Patient Instructions below. Patient Instructions  Your physician recommends that you schedule a follow-up appointment in: 2 months with [name]    Signed, [place], [place]  08/25/2016 12:48 PM    [address], [place], [place]  [address] Phone: [phone]; Fax: [phone]

## 2016-08-24 NOTE — Progress Notes (Signed)
PCP is [name], MD Referring Provider is [name], MD  Chief Complaint  Patient presents with  . Lung Lesion    Surgical eval, PET Scan 08/17/16, Chest CT 08/05/16, Spirometry 08/10/16    HPI: 66 yo man sent for consultation re: right upper lobe mass  James [name] is a 66 yo man with a past history of arthritis, type II diabetes without complication, GERD, positive PPD, hemoptysis in 2011, OSA requiring CPAP, depression and chronic low back pain. He is a lifelong non-smoker. He was evaluated for hemoptysis in 2011. In 2014 he had a right upper lobe GGO noted on CT. He underwent ENB which was non-diagnostic. He had a pneumothorax requiring CT placement post procedure.  He was then lost to follow up.   He recently had a the "flu" and a sore throat. He was noted to have a new heart murmur on exam. Work up revealed severe mitral regurgitation. He had mild (65%) LAD disease at cath. EF was 55-65%. Right heart pressures were normal. A follow up CT showed the right upper lobe nodule had increased from 8 mm to 2.5 cm and was now solid and spiculated. A PET showed the nodule was hypermetabolic. There was no evidence of regional or distant metastases.  He is retired. He remains active physically. He walks about 4 miles a day. He has arthritis in his knee and has had a left hip replacement. He was told that he had a blood clot in the left leg at some point. He has sleep apnea and uses CPAP. He gets short of breath at times if he starts walking too fast but denies chest pain or pressure. He has had some tightness in his chest since he has learned of his issues.  No change in appetite or weight loss.  Zubrod Score: At the time of surgery this patient's most appropriate activity status/level should be described as: '[x]'$     0    Normal activity, no symptoms '[]'$     1    Restricted in physical strenuous activity but ambulatory, able to do out light work '[]'$     2    Ambulatory and capable of self care, unable  to do work activities, up and about >50 % of waking hours                              '[]'$     3    Only limited self care, in bed greater than 50% of waking hours '[]'$     4    Completely disabled, no self care, confined to bed or chair '[]'$     5    Moribund   Past Medical History:  Diagnosis Date  . Arthritis    "knees, hips, back" (10/19/2012)  . Chronic lower back pain   . Colon polyps    10/27/2002, repeat letter 09/17/2007  . Depressive disorder, not elsewhere classified   . Diabetes mellitus without complication ([place])   . Fasting hyperglycemia   . GERD (gastroesophageal reflux disease)   . Hemoptysis    abnormal CT Chest 01/29/10 - ? new GG changes RUL > not viz on plain cxr 02/26/2010  . MPN (myeloproliferative neoplasm) ([place])    1st detected 06/04/1998  . Obesity   . OSA on CPAP   . Other and unspecified hyperlipidemia   . Positive PPD 1965   "non reactive in 2012" (10/19/2012)  . Routine general medical examination at  a health care facility   . Special screening for malignant neoplasm of prostate   . Spinal stenosis, unspecified region other than cervical   . Wrist pain, left     Past Surgical History:  Procedure Laterality Date  . ANTERIOR CRUCIATE LIGAMENT REPAIR Left 1967  . CARDIAC CATHETERIZATION  2000  . CHEST TUBE INSERTION Right 10/19/2012   post bronch  . COLONOSCOPY W/ POLYPECTOMY    . FLEXIBLE BRONCHOSCOPY  10/19/2012   Flexible video fiberoptic bronchoscopy with electromagnetic navigation and biopsies.  . INTRAVASCULAR PRESSURE WIRE/FFR STUDY N/A 08/11/2016   Procedure: Intravascular Pressure Wire/FFR Study;  Surgeon: [name], MD;  Location: [facility];  Service: Cardiovascular;  Laterality: N/A;  . RIGHT/LEFT HEART CATH AND CORONARY ANGIOGRAPHY N/A 08/11/2016   Procedure: Right/Left Heart Cath and Coronary Angiography;  Surgeon: [name], MD;  Location: [facility];  Service: Cardiovascular;  Laterality: N/A;  . TEE WITHOUT CARDIOVERSION N/A  07/17/2016   Procedure: TRANSESOPHAGEAL ECHOCARDIOGRAM (TEE);  Surgeon: [name], MD;  Location: [facility];  Service: Cardiovascular;  Laterality: N/A;  . TONSILLECTOMY  1950's  . TOTAL HIP ARTHROPLASTY Left 10/05/2014   dr [name]  . TOTAL HIP ARTHROPLASTY Left 10/05/2014   Procedure: LEFT TOTAL HIP ARTHROPLASTY ANTERIOR APPROACH;  Surgeon: [name], MD;  Location: [facility];  Service: Orthopedics;  Laterality: Left;  Marland Kitchen VIDEO BRONCHOSCOPY WITH ENDOBRONCHIAL NAVIGATION N/A 10/19/2012   Procedure: VIDEO BRONCHOSCOPY WITH ENDOBRONCHIAL NAVIGATION;  Surgeon: [name], MD;  Location: [facility];  Service: Thoracic;  Laterality: N/A;  . WRIST RECONSTRUCTION  12/2009   'proximal row carpectomy" [name]    Family History  Problem Relation Age of Onset  . Heart disease Father   . Heart attack Father   . Heart disease Paternal Uncle   . Prostate cancer Neg Hx   . Colon cancer Neg Hx   . Hypertension Neg Hx   . Hyperlipidemia Neg Hx   . Diabetes Neg Hx     Social History Social History  Substance Use Topics  . Smoking status: Never Smoker  . Smokeless tobacco: Never Used  . Alcohol use 0.6 oz/week    1 Cans of beer per week    Current Outpatient Prescriptions  Medication Sig Dispense Refill  . aspirin EC 81 MG tablet Take 1 tablet (81 mg total) by mouth daily. 90 tablet 3  . Multiple Vitamin (MULTIVITAMIN) tablet Take 1 tablet by mouth daily.    . Omega-3 Fatty Acids (FISH OIL) 500 MG CAPS Take 500 mg by mouth.    Marland Kitchen atorvastatin (LIPITOR) 20 MG tablet Take 1 tablet (20 mg total) by mouth daily. (Patient not taking: Reported on 08/24/2016) 90 tablet 3   No current facility-administered medications for this visit.     Allergies  Allergen Reactions  . Amoxicillin Itching    Review of Systems  Constitutional: Negative for activity change, appetite change, chills, fever and unexpected weight change.  HENT: Negative for trouble swallowing and voice change.   Eyes: Negative for  visual disturbance.  Respiratory: Positive for apnea, chest tightness and shortness of breath. Negative for cough and wheezing.   Cardiovascular: Negative for chest pain and leg swelling.  Gastrointestinal: Positive for abdominal pain (reflux). Negative for abdominal distention and blood in stool.  Endocrine: Negative for polydipsia and polyphagia.  Genitourinary: Negative for difficulty urinating and dysuria.  Musculoskeletal: Positive for arthralgias and joint swelling.  Neurological: Negative for syncope, speech difficulty, weakness and headaches.  Hematological: Positive for adenopathy (under chin). Does not bruise/bleed easily.  Psychiatric/Behavioral: Positive for dysphoric mood.    BP 140/79   Pulse 66   Resp 20   Ht '5\' 9"'$  (1.753 m)   Wt 232 lb (105.2 kg)   SpO2 97% Comment: RA  BMI 34.26 kg/m  Physical Exam  Constitutional: He is oriented to person, place, and time. He appears well-nourished. No distress.  obese  HENT:  Head: Normocephalic and atraumatic.  Mouth/Throat: No oropharyngeal exudate.  Neck:  Submandibular node mildly enlarged and tender  Cardiovascular: Normal rate, regular rhythm and intact distal pulses.   Murmur heard. Pulmonary/Chest: Effort normal and breath sounds normal. No respiratory distress. He has no wheezes. He has no rales.  Abdominal: Soft. He exhibits no distension. There is no tenderness.  Musculoskeletal: He exhibits edema (2+ non pitting).  Neurological: He is alert and oriented to person, place, and time. No cranial nerve deficit.  No motor deficit  Vitals reviewed.    Diagnostic Tests: CT CHEST WITH CONTRAST  TECHNIQUE: Multidetector CT imaging of the chest was performed during intravenous contrast administration.  CONTRAST:  80 cc Isovue-300 IV.  COMPARISON:  11/10/2012 chest radiograph.   09/12/2012 chest CT.  FINDINGS: Cardiovascular: Normal heart size. No significant pericardial fluid/thickening. Left anterior  descending coronary atherosclerosis. Atherosclerotic nonaneurysmal thoracic aorta. Normal caliber pulmonary arteries. No acute central pulmonary emboli. Stable attenuated segmental left lower lobe pulmonary artery branch with curvilinear internal filling defects (series 3/image 88), unchanged since 09/12/2012, compatible with chronic pulmonary embolism.  Mediastinum/Nodes: No discrete thyroid nodules. Unremarkable esophagus. No pathologically enlarged axillary, mediastinal or hilar lymph nodes.  Lungs/Pleura: No pneumothorax. No pleural effusion. Irregular solid 2.5 x 1.8 cm anterior right upper lobe pulmonary nodule with spiculations extending to the anterior and mediastinal pleural surface (series 4/ image 50), increased in size and density from 09/12/2012 where it measured 2.0 x 1.3 cm using similar measurement technique. Subpleural 4 mm posterior right upper lobe pulmonary nodule (series 4/ image 79) and peripheral right lower lobe 5 mm pulmonary nodule (series 4/ image 120) are stable since 09/12/2012 and considered benign. Stable scattered calcified tiny 2-3 mm granulomas throughout both lungs. New small focus of tree-in-bud opacity in the medial left upper lobe (series 4/ image 48). No acute consolidative airspace disease or additional significant pulmonary nodules.  Upper abdomen: Simple 4.8 cm inferior right liver lobe and 2.5 cm lateral segment left liver lobe cysts are stable. Partially visualized parapelvic renal cysts in the left kidney.  Musculoskeletal: No aggressive appearing focal osseous lesions. Moderate thoracic spondylosis.  IMPRESSION: 1. Irregular solid 2.5 cm anterior right upper lobe pulmonary nodule, increased in size and density since 09/12/2012 chest CT, most suggestive of primary bronchogenic adenocarcinoma. Multidisciplinary thoracic oncology consultation advised. 2. No thoracic lymphadenopathy or other findings of metastatic disease in the  chest. 3. Aortic atherosclerosis.  One vessel coronary atherosclerosis. 4. Mild tree-in-bud opacities in the medial left upper lobe are new and most suggestive of a nonspecific mild infectious or inflammatory bronchiolitis. 5. Stable findings suggestive of chronic pulmonary embolism within a segmental left lower lobe pulmonary artery branch.   Electronically Signed   By: Ilona Sorrel M.D.   On: 08/05/2016 16:03 NUCLEAR MEDICINE PET SKULL BASE TO THIGH  TECHNIQUE: 11.3 mCi F-18 FDG was injected intravenously. Full-ring PET imaging was performed from the skull base to thigh after the radiotracer. CT data was obtained and used for attenuation correction and anatomic localization.  FASTING BLOOD GLUCOSE:  Value:  120 mg/dl  COMPARISON:  08/05/2016 chest CT.  FINDINGS: NECK  No hypermetabolic lymph nodes in the neck.  Nonspecific mild asymmetric hypermetabolism in the left palatine tonsil/left tongue base region, without discrete CT correlate.  CHEST  Left anterior descending coronary atherosclerosis. Atherosclerotic nonaneurysmal thoracic aorta. No hypermetabolic or enlarged axillary, mediastinal or hilar lymph nodes. No pneumothorax. No pleural effusions.  Hypermetabolic solid 2.6 x 1.9 cm anterior right upper lobe pulmonary nodule (series 7/image 17) with max SUV 7.7.  Right lower lobe 4 mm solid pulmonary nodule (series 7/image 38), below PET resolution, stable since 09/12/2012, considered benign. No acute consolidative airspace disease or additional significant pulmonary nodules.  ABDOMEN/PELVIS  No abnormal hypermetabolic activity within the liver, pancreas, adrenal glands, or spleen. No hypermetabolic lymph nodes in the abdomen or pelvis. Two simple liver cysts, largest 4.8 cm in the posterior right liver lobe. Simple parapelvic renal cysts in the left kidney. No hydronephrosis. Simple appearing 2.9 cm renal cortical cyst in the lower left kidney.  Atherosclerotic nonaneurysmal abdominal aorta. Mildly enlarged prostate with nonspecific internal prostatic calcifications. Mild sigmoid diverticulosis.  SKELETON  No focal hypermetabolic activity to suggest skeletal metastasis. Left total hip arthroplasty.  IMPRESSION: 1. Hypermetabolic solid 2.6 cm anterior right upper lobe pulmonary nodule, compatible with prior bronchogenic carcinoma. 2. No hypermetabolic thoracic lymphadenopathy or distant metastatic disease. 3. Nonspecific mild asymmetric hypermetabolism in the left palatine tonsil/left tongue base region, without discrete CT correlate. Recommend correlation with direct visualization. 4. Additional findings include aortic atherosclerosis, coronary atherosclerosis, mild sigmoid diverticulosis and mildly enlarged prostate.   Electronically Signed   By: Ilona Sorrel M.D.   On: 08/17/2016 09:56  Spirometry FVC= 3.86(88%) FEV1= 2.91(90%)  Impression: James [name] is a 66 year old nonsmoker who recently was found to have severe mitral regurgitation and moderate single-vessel coronary disease. He is asymptomatic from those. He has a long-standing history of a right upper lobe nodule. He had a navigational bronchoscopy which was nondiagnostic for years ago. That was complicated by pneumothorax requiring chest tube placement. He then was lost to follow-up. Her recent CT showed the nodule had increased in size dramatically from less than a centimeter to 2.6 cm. The nodule was hypermetabolic on PET. There is no evidence of regional or distant metastases. This nodule is highly suspicious for a primary bronchogenic carcinoma and has to be considered that, unless it can be proven otherwise.  I recommended that we proceed with right VATS for wedge resection and then possible right upper lobectomy if the nodule was positive on frozen section. I described the general nature of the procedure to James [name] and his wife. They are aware of the  need for general anesthesia and the incisions to be used. We discussed the expected hospital stay, overall recovery and short and long term outcomes. I informed them of the indications, risks, benefits and alternatives. They understand the risks include, but are not limited to death, stroke, MI, DVT/PE, bleeding, possible need for transfusion, infections, prolonged air leaks, conversion to open thoracotomy, chronic pain, as well as other organ system dysfunction including respiratory, renal, or GI complications.   He accept the risks and agrees to proceed.  Plan: MR brain as planned 4/24  Right VATS, wedge resection, possible right upper lobectomy on Wed 4/25  Melrose Nakayama, MD [facility] [phone]

## 2016-08-24 NOTE — Patient Instructions (Signed)
Your physician recommends that you schedule a follow-up appointment in: 2 months with [name]

## 2016-08-25 ENCOUNTER — Encounter: Payer: Self-pay | Admitting: Physician Assistant

## 2016-08-26 ENCOUNTER — Telehealth: Payer: Self-pay | Admitting: Cardiology

## 2016-08-26 NOTE — Telephone Encounter (Signed)
Closed encounter °

## 2016-08-28 NOTE — Pre-Procedure Instructions (Signed)
James [name]  08/28/2016      [id number] [place] [id number]:: [phone]:: [phone]   Your procedure is scheduled on    Wednesday  09/02/16  [facility]  Call this number if you have problems the morning of surgery:  [phone]   Remember:  Do not eat food or drink liquids after midnight.  Take these medicines the morning of surgery with A SIP OF WATER   NONE           (STOP NOW  MULTIVITAMIN, NAPROXEN/ ANAPROX, OMEGA 3 FISH OIL, IBUPROFEN/ ADVIL/ MOTRIN, GOODY POWDERS, BC'S, HERBAL MEDICINES)   Do not wear jewelry, make-up or nail polish.  Do not wear lotions, powders, or perfumes, or deoderant.  Do not shave 48 hours prior to surgery.  Men may shave face and neck.  Do not bring valuables to the hospital.  [facility] or [facility].  Contacts, dentures or bridgework may not be worn into surgery.  Leave your suitcase in the car.  After surgery it may be brought to your room.  For patients admitted to the hospital, discharge time will be determined by your treatment team.  Patients discharged the day of surgery will not be allowed to drive home.   Name and phone number of your driver:    Special instructions:  [facility]- Preparing for [facility]  Before surgery, you can play an important role.  Because skin is not sterile, your skin needs to be as free of germs as possible.  You can reduce the number of germs on you skin by washing with CHG (chlorahexidine gluconate) soap before surgery.  CHG is an antiseptic cleaner which kills germs and bonds with the skin to continue killing germs even after washing.  Please DO NOT use if you have an allergy to CHG or antibacterial soaps.  If your skin becomes reddened/irritated stop using the CHG and inform your nurse when you arrive at Short Stay.  Do not shave (including legs  and underarms) for at least 48 hours prior to the first CHG shower.  You may shave your face.  Please follow these instructions carefully:   1.  Shower with CHG Soap the night before surgery and the                                morning of Surgery.  2.  If you choose to wash your hair, wash your hair first as usual with your       normal shampoo.  3.  After you shampoo, rinse your hair and body thoroughly to remove the                      Shampoo.  4.  Use CHG as you would any other liquid soap.  You can apply chg directly       to the skin and wash gently with scrungie or a clean washcloth.  5.  Apply the CHG Soap to your body ONLY FROM THE NECK DOWN.        Do not use on open wounds or open sores.  Avoid contact with your eyes,       ears, mouth and genitals (private parts).  Wash genitals (private parts)  with your normal soap.  6.  Wash thoroughly, paying special attention to the area where your surgery        will be performed.  7.  Thoroughly rinse your body with warm water from the neck down.  8.  DO NOT shower/wash with your normal soap after using and rinsing off       the CHG Soap.  9.  Pat yourself dry with a clean towel.            10.  Wear clean pajamas.            11.  Place clean sheets on your bed the night of your first shower and do not        sleep with pets.  Day of Surgery  Do not apply any lotions/deoderants the morning of surgery.  Please wear clean clothes to the hospital/surgery center.    Please read over the following fact sheets that you were given. Pain Booklet, MRSA Information and Surgical Site Infection Prevention

## 2016-08-31 ENCOUNTER — Other Ambulatory Visit: Payer: Self-pay

## 2016-08-31 ENCOUNTER — Encounter (HOSPITAL_COMMUNITY): Payer: Self-pay

## 2016-08-31 ENCOUNTER — Ambulatory Visit (HOSPITAL_COMMUNITY)
Admission: RE | Admit: 2016-08-31 | Discharge: 2016-08-31 | Disposition: A | Discharge status: Home / Self Care | Payer: Medicare Other | Source: Ambulatory Visit | Attending: Thoracic Surgery (Cardiothoracic Vascular Surgery) | Admitting: Thoracic Surgery (Cardiothoracic Vascular Surgery)

## 2016-08-31 ENCOUNTER — Encounter (HOSPITAL_COMMUNITY)
Admission: RE | Admit: 2016-08-31 | Discharge: 2016-08-31 | Disposition: A | Discharge status: Home / Self Care | Payer: Medicare Other | Source: Ambulatory Visit | Attending: Thoracic Surgery (Cardiothoracic Vascular Surgery) | Admitting: Thoracic Surgery (Cardiothoracic Vascular Surgery)

## 2016-08-31 DIAGNOSIS — R911 Solitary pulmonary nodule: Secondary | ICD-10-CM | POA: Insufficient documentation

## 2016-08-31 DIAGNOSIS — Z01810 Encounter for preprocedural cardiovascular examination: Secondary | ICD-10-CM

## 2016-08-31 DIAGNOSIS — Z01812 Encounter for preprocedural laboratory examination: Secondary | ICD-10-CM | POA: Insufficient documentation

## 2016-08-31 HISTORY — DX: Nonrheumatic mitral (valve) insufficiency: I34.0

## 2016-08-31 HISTORY — DX: Anxiety disorder, unspecified: F41.9

## 2016-08-31 HISTORY — DX: Atherosclerotic heart disease of native coronary artery without angina pectoris: I25.10

## 2016-08-31 LAB — COMPREHENSIVE METABOLIC PANEL
ALT: 39 U/L (ref 17–63)
AST: 36 U/L (ref 15–41)
Albumin: 4.1 g/dL (ref 3.5–5.0)
Alkaline Phosphatase: 51 U/L (ref 38–126)
Anion gap: 9 (ref 5–15)
BUN: 21 mg/dL — ABNORMAL HIGH (ref 6–20)
CO2: 20 mmol/L — ABNORMAL LOW (ref 22–32)
Calcium: 9.2 mg/dL (ref 8.9–10.3)
Chloride: 109 mmol/L (ref 101–111)
Creatinine, Ser: 0.99 mg/dL (ref 0.61–1.24)
GFR calc Af Amer: >60 mL/min (ref >60)
GFR calc non Af Amer: >60 mL/min (ref >60)
Glucose, Bld: 126 mg/dL — ABNORMAL HIGH (ref 65–99)
Potassium: 4.6 mmol/L (ref 3.5–5.1)
Sodium: 138 mmol/L (ref 135–145)
Total Bilirubin: 0.9 mg/dL (ref 0.3–1.2)
Total Protein: 6.7 g/dL (ref 6.5–8.1)

## 2016-08-31 LAB — CBC
HCT: 45.6 % (ref 39.0–52.0)
Hemoglobin: 15.3 g/dL (ref 13.0–17.0)
MCH: 30.1 pg (ref 26.0–34.0)
MCHC: 33.6 g/dL (ref 30.0–36.0)
MCV: 89.8 fL (ref 78.0–100.0)
Platelets: 156 10*3/uL (ref 150–400)
RBC: 5.08 MIL/uL (ref 4.22–5.81)
RDW: 14.1 % (ref 11.5–15.5)
WBC: 6.2 10*3/uL (ref 4.0–10.5)

## 2016-08-31 LAB — BLOOD GAS, ARTERIAL
Acid-base deficit: 1.1 mmol/L (ref 0.0–2.0)
Bicarbonate: 22.7 mmol/L (ref 20.0–28.0)
Drawn by: 449841
FIO2: 21
O2 Saturation: 97.3 %
Patient temperature: 98.6
pCO2 arterial: 35 mmHg (ref 32.0–48.0)
pH, Arterial: 7.428 (ref 7.350–7.450)
pO2, Arterial: 97.5 mmHg (ref 83.0–108.0)

## 2016-08-31 LAB — URINALYSIS, ROUTINE W REFLEX MICROSCOPIC
Bilirubin Urine: NEGATIVE
Glucose, UA: NEGATIVE mg/dL
Hgb urine dipstick: NEGATIVE
Ketones, ur: NEGATIVE mg/dL
Leukocytes, UA: NEGATIVE
Nitrite: NEGATIVE
Protein, ur: NEGATIVE mg/dL
Specific Gravity, Urine: 1.014 (ref 1.005–1.030)
pH: 5 (ref 5.0–8.0)

## 2016-08-31 LAB — PROTIME-INR
INR: 0.97
Prothrombin Time: 12.9 seconds (ref 11.4–15.2)

## 2016-08-31 LAB — GLUCOSE, CAPILLARY: Glucose-Capillary: 120 mg/dL — ABNORMAL HIGH (ref 65–99)

## 2016-08-31 LAB — SURGICAL PCR SCREEN
MRSA, PCR: NEGATIVE
Staphylococcus aureus: NEGATIVE

## 2016-08-31 LAB — APTT: aPTT: 26 seconds (ref 24–36)

## 2016-08-31 NOTE — Progress Notes (Signed)
PCP  Dr, [name], James [name]  [facility]  Cardiologist  Dr.  Peter Martinique ECHO and TEE   Cardiac cath  Pt. States that he has had no symptoms  Has heat murmur  Needs to have valve  repair

## 2016-09-01 ENCOUNTER — Encounter (HOSPITAL_COMMUNITY): Payer: Self-pay

## 2016-09-01 ENCOUNTER — Ambulatory Visit
Admission: RE | Admit: 2016-09-01 | Discharge: 2016-09-01 | Disposition: A | Discharge status: Home / Self Care | Payer: Medicare Other | Source: Ambulatory Visit | Attending: Thoracic Surgery (Cardiothoracic Vascular Surgery) | Admitting: Thoracic Surgery (Cardiothoracic Vascular Surgery)

## 2016-09-01 ENCOUNTER — Encounter (HOSPITAL_COMMUNITY): Payer: Self-pay | Admitting: Certified Registered Nurse Anesthetist

## 2016-09-01 DIAGNOSIS — C7931 Secondary malignant neoplasm of brain: Secondary | ICD-10-CM

## 2016-09-01 DIAGNOSIS — C3411 Malignant neoplasm of upper lobe, right bronchus or lung: Secondary | ICD-10-CM

## 2016-09-01 LAB — HEMOGLOBIN A1C
Hgb A1c MFr Bld: 6 % — ABNORMAL HIGH (ref 4.8–5.6)
Mean Plasma Glucose: 126 mg/dL

## 2016-09-01 MED ORDER — VANCOMYCIN HCL 10 G IV SOLR
1500.0000 mg | INTRAVENOUS | Status: AC
  Administered 2016-09-02: 1500 mg via INTRAVENOUS
  Filled 2016-09-01: qty 1500

## 2016-09-01 MED ORDER — GADOBENATE DIMEGLUMINE 529 MG/ML IV SOLN
20.0000 mL | Freq: Once | INTRAVENOUS | Status: AC | PRN
  Administered 2016-09-01: 20 mL via INTRAVENOUS

## 2016-09-01 NOTE — Progress Notes (Signed)
Anesthesia Chart Review: Patient is a 66 year old male scheduled for right VATS, wedge resection, possible RU lobectomy on 09/02/2016 by Dr. [name]. He has a hypermetabolic RUL lung lesion that has increased in size. He also has nonspecific mild asymmetric hypermetabolism in the left palatine tonsil/left tongue base region with correlation with direct visualization recommended. (He had a negative soft tissue neck CT 08/05/16.) He has newly diagnosed severe MR and 1V CAD. He has been evaluated by CT surgeons Dr. Roxy Manns and Dr. [name]. Dr. Roxy Manns also discussed timing of surgeries with cardiologist [name]. Plan is to proceed with right lung surgery prior to MVR +/- 1V CABG.  History includes never smoker, DM2 (diet controlled), severe mitral regurgitation, 1V CAD (moderate LAD) '18, anxiety, depression, HLD, GERD, myeloproliferative neoplasm (first detected 06/04/98), hemoptysis '11, OSA (CPAP), left THA 10/05/14, positive PPD (as child; non-reactive '12), video bronchoscopy 10/19/12 (non-diagnostic; complicated by post-procedure PTX; lost to follow-up until 07/2016), tonsillectomy.08/05/16 chest CT findings included RUL nodule increased in size since 2014 and findings suggestive of chronic PE within a segmental LLL pulmonary branch. BMI is consistent with obesity.   PCP is [name].  Cardiologist is Dr. Peter Martinique. Last visit 08/24/16 with Almyra Deforest, PA-C.  Pulmonologist is Dr. Baltazar Apo.  Meds include aspirin 81 mg, Lipitor, flaxseed, fish oil.  BP (!) 142/71   Pulse 62   Temp 36.8 C   Resp 20   Ht '5\' 9"'$  (1.753 m)   Wt 233 lb 11.2 oz (106 kg)   SpO2 99%   BMI 34.51 kg/m   EKG 08/31/16: SB at 58 bpm.  Cardiac cath 08/11/16 (Dr. Peter Martinique):  Prox LAD to Mid LAD lesion, 30 %stenosed.  Mid LAD to Dist LAD lesion, 65 %stenosed.  Prox RCA to Mid RCA lesion, 15 %stenosed.  The left ventricular systolic function is normal.  LV end diastolic pressure is normal.  The left  ventricular ejection fraction is 55-65% by visual estimate.  LV end diastolic pressure is normal. 1. Borderline single vessel obstructive CAD involving the mid LAD. FFR 0.81 2. Normal LV function EF 65% 3. Normal right heart and LV filling pressures 4. Normal Cardiac output Plan: will refer to CT surgery for consideration of MV repair. The stenosis in the LAD will be discussed. He is asymptomatic and I would favor treating it medically. If he develops symptoms in the future it could be treated with PCI.   TEE 07/17/16: Study Conclusions - Left ventricle: The cavity size was normal. There was mild   concentric hypertrophy. Systolic function was normal. The   estimated ejection fraction was in the range of 60% to 65%. Wall   motion was normal; there were no regional wall motion   abnormalities. - Aortic valve: No evidence of vegetation. - Mitral valve: There is a partially flail P3 segment due to   ruptured cord, seen prolapsing in both 2D and 3D images. There is   severe, posterolaterally directed mitral regurgitation which   swirls into the left atrial appendage. - Left atrium: The atrium was dilated. - Right atrium: No evidence of thrombus in the atrial cavity or   appendage. - Atrial septum: No defect or patent foramen ovale was identified. 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   LVEF 60-65%.  Event monitor 08/14/15--09/18/15: NSR  Average HR 64 bpm No significant arrhythmias No AV block No  pauses   Chest CT 08/05/16: IMPRESSION: 1. Irregular solid 2.5 cm anterior right upper lobe pulmonary nodule, increased in size and density since 09/12/2012 chest CT, most suggestive of primary bronchogenic adenocarcinoma. Multidisciplinary thoracic oncology consultation advised. 2. No thoracic lymphadenopathy or other findings of metastatic disease in the chest. 3. Aortic  atherosclerosis.  One vessel coronary atherosclerosis. 4. Mild tree-in-bud opacities in the medial left upper lobe are new and most suggestive of a nonspecific mild infectious or inflammatory bronchiolitis. 5. Stable findings suggestive of chronic pulmonary embolism within a segmental left lower lobe pulmonary artery branch.  PET Scan 08/17/16: IMPRESSION: 1. Hypermetabolic solid 2.6 cm anterior right upper lobe pulmonary nodule, compatible with prior bronchogenic carcinoma. 2. No hypermetabolic thoracic lymphadenopathy or distant metastatic disease. 3. Nonspecific mild asymmetric hypermetabolism in the left palatine tonsil/left tongue base region, without discrete CT correlate. Recommend correlation with direct visualization. 4. Additional findings include aortic atherosclerosis, coronary atherosclerosis, mild sigmoid diverticulosis and mildly enlarged prostate.  Spirometry 08/10/16: Baseline                                                                       FVC                 3.86 L  (88% predicted)           FEV1               2.91 L  (90% predicted)           [id number]        2.33 L  (92% predicted)      Preoperative labs noted. A1c 6.0.   If no acute changes then I anticipate that he can proceed as planned.  [facility] Short Stay Center/Anesthesiology Phone [phone] 09/01/2016 12:37 PM

## 2016-09-02 ENCOUNTER — Encounter (HOSPITAL_COMMUNITY)
Admission: RE | Disposition: A | Discharge status: Home / Self Care | Payer: Self-pay | Source: Ambulatory Visit | Attending: Thoracic Surgery (Cardiothoracic Vascular Surgery)

## 2016-09-02 ENCOUNTER — Inpatient Hospital Stay (HOSPITAL_COMMUNITY): Payer: Medicare Other | Admitting: Vascular Surgery

## 2016-09-02 ENCOUNTER — Inpatient Hospital Stay (HOSPITAL_COMMUNITY): Payer: Medicare Other | Admitting: Certified Registered Nurse Anesthetist

## 2016-09-02 ENCOUNTER — Encounter (HOSPITAL_COMMUNITY): Payer: Self-pay | Admitting: *Deleted

## 2016-09-02 ENCOUNTER — Inpatient Hospital Stay (HOSPITAL_COMMUNITY): Payer: Medicare Other

## 2016-09-02 ENCOUNTER — Inpatient Hospital Stay (HOSPITAL_COMMUNITY)
Admission: RE | Admit: 2016-09-02 | Discharge: 2016-09-07 | DRG: 164 | Disposition: A | Discharge status: Home / Self Care | Payer: Medicare Other | Source: Ambulatory Visit | Attending: Thoracic Surgery (Cardiothoracic Vascular Surgery) | Admitting: Thoracic Surgery (Cardiothoracic Vascular Surgery)

## 2016-09-02 DIAGNOSIS — R911 Solitary pulmonary nodule: Secondary | ICD-10-CM

## 2016-09-02 DIAGNOSIS — K219 Gastro-esophageal reflux disease without esophagitis: Secondary | ICD-10-CM | POA: Diagnosis present

## 2016-09-02 DIAGNOSIS — Z96642 Presence of left artificial hip joint: Secondary | ICD-10-CM | POA: Diagnosis present

## 2016-09-02 DIAGNOSIS — E669 Obesity, unspecified: Secondary | ICD-10-CM | POA: Diagnosis present

## 2016-09-02 DIAGNOSIS — E119 Type 2 diabetes mellitus without complications: Secondary | ICD-10-CM | POA: Diagnosis present

## 2016-09-02 DIAGNOSIS — Z79899 Other long term (current) drug therapy: Secondary | ICD-10-CM

## 2016-09-02 DIAGNOSIS — D62 Acute posthemorrhagic anemia: Secondary | ICD-10-CM | POA: Diagnosis not present

## 2016-09-02 DIAGNOSIS — G4733 Obstructive sleep apnea (adult) (pediatric): Secondary | ICD-10-CM | POA: Diagnosis present

## 2016-09-02 DIAGNOSIS — M170 Bilateral primary osteoarthritis of knee: Secondary | ICD-10-CM | POA: Diagnosis present

## 2016-09-02 DIAGNOSIS — I340 Nonrheumatic mitral (valve) insufficiency: Secondary | ICD-10-CM | POA: Diagnosis present

## 2016-09-02 DIAGNOSIS — Z6834 Body mass index (BMI) 34.0-34.9, adult: Secondary | ICD-10-CM | POA: Diagnosis not present

## 2016-09-02 DIAGNOSIS — C3491 Malignant neoplasm of unspecified part of right bronchus or lung: Secondary | ICD-10-CM

## 2016-09-02 DIAGNOSIS — I9752 Accidental puncture and laceration of a circulatory system organ or structure during other procedure: Secondary | ICD-10-CM | POA: Diagnosis not present

## 2016-09-02 DIAGNOSIS — E876 Hypokalemia: Secondary | ICD-10-CM | POA: Diagnosis not present

## 2016-09-02 DIAGNOSIS — C3411 Malignant neoplasm of upper lobe, right bronchus or lung: Secondary | ICD-10-CM

## 2016-09-02 DIAGNOSIS — I10 Essential (primary) hypertension: Secondary | ICD-10-CM | POA: Diagnosis present

## 2016-09-02 DIAGNOSIS — Z880 Allergy status to penicillin: Secondary | ICD-10-CM | POA: Diagnosis not present

## 2016-09-02 DIAGNOSIS — Z7982 Long term (current) use of aspirin: Secondary | ICD-10-CM | POA: Diagnosis not present

## 2016-09-02 DIAGNOSIS — Z09 Encounter for follow-up examination after completed treatment for conditions other than malignant neoplasm: Secondary | ICD-10-CM

## 2016-09-02 DIAGNOSIS — M479 Spondylosis, unspecified: Secondary | ICD-10-CM | POA: Diagnosis present

## 2016-09-02 DIAGNOSIS — I2510 Atherosclerotic heart disease of native coronary artery without angina pectoris: Secondary | ICD-10-CM | POA: Diagnosis present

## 2016-09-02 DIAGNOSIS — Z4682 Encounter for fitting and adjustment of non-vascular catheter: Secondary | ICD-10-CM

## 2016-09-02 DIAGNOSIS — Z902 Acquired absence of lung [part of]: Secondary | ICD-10-CM

## 2016-09-02 DIAGNOSIS — J948 Other specified pleural conditions: Secondary | ICD-10-CM | POA: Diagnosis not present

## 2016-09-02 DIAGNOSIS — J939 Pneumothorax, unspecified: Secondary | ICD-10-CM

## 2016-09-02 HISTORY — PX: LYMPH NODE DISSECTION: SHX5087

## 2016-09-02 HISTORY — PX: LOBECTOMY: SHX5089

## 2016-09-02 HISTORY — PX: VIDEO ASSISTED THORACOSCOPY (VATS)/WEDGE RESECTION: SHX6174

## 2016-09-02 LAB — GLUCOSE, CAPILLARY
Glucose-Capillary: 176 mg/dL — ABNORMAL HIGH (ref 65–99)
Glucose-Capillary: 178 mg/dL — ABNORMAL HIGH (ref 65–99)
Glucose-Capillary: 184 mg/dL — ABNORMAL HIGH (ref 65–99)
Glucose-Capillary: 197 mg/dL — ABNORMAL HIGH (ref 65–99)

## 2016-09-02 LAB — CBC
HCT: 34 % — ABNORMAL LOW (ref 39.0–52.0)
Hemoglobin: 11.6 g/dL — ABNORMAL LOW (ref 13.0–17.0)
MCH: 30.8 pg (ref 26.0–34.0)
MCHC: 34.1 g/dL (ref 30.0–36.0)
MCV: 90.2 fL (ref 78.0–100.0)
Platelets: 152 10*3/uL (ref 150–400)
RBC: 3.77 MIL/uL — ABNORMAL LOW (ref 4.22–5.81)
RDW: 14.4 % (ref 11.5–15.5)
WBC: 15.2 10*3/uL — ABNORMAL HIGH (ref 4.0–10.5)

## 2016-09-02 LAB — PREPARE RBC (CROSSMATCH)

## 2016-09-02 SURGERY — VIDEO ASSISTED THORACOSCOPY (VATS)/WEDGE RESECTION
Anesthesia: General | Site: Chest | Laterality: Right

## 2016-09-02 MED ORDER — EPHEDRINE 5 MG/ML INJ
INTRAVENOUS | Status: AC
  Filled 2016-09-02: qty 10

## 2016-09-02 MED ORDER — SODIUM CHLORIDE 0.9% FLUSH: 9.0000 mL | INTRAVENOUS | Status: DC | PRN

## 2016-09-02 MED ORDER — KETOROLAC TROMETHAMINE 15 MG/ML IJ SOLN
INTRAMUSCULAR | Status: AC
  Filled 2016-09-02: qty 1

## 2016-09-02 MED ORDER — ONDANSETRON HCL 4 MG/2ML IJ SOLN
INTRAMUSCULAR | Status: DC | PRN
  Administered 2016-09-02: 4 mg via INTRAVENOUS

## 2016-09-02 MED ORDER — INSULIN ASPART 100 UNIT/ML ~~LOC~~ SOLN
0.0000 [IU] | SUBCUTANEOUS | Status: DC
  Administered 2016-09-02: 3 [IU] via SUBCUTANEOUS
  Administered 2016-09-03: 2 [IU] via SUBCUTANEOUS
  Administered 2016-09-03: 3 [IU] via SUBCUTANEOUS

## 2016-09-02 MED ORDER — ROCURONIUM BROMIDE 10 MG/ML (PF) SYRINGE
PREFILLED_SYRINGE | INTRAVENOUS | Status: AC
  Filled 2016-09-02: qty 5

## 2016-09-02 MED ORDER — BUPIVACAINE 0.5 % ON-Q PUMP SINGLE CATH 400 ML
INJECTION | Status: DC | PRN
  Administered 2016-09-02: 400 mL

## 2016-09-02 MED ORDER — OXYCODONE HCL 5 MG PO TABS: 5.0000 mg | ORAL_TABLET | Freq: Once | ORAL | Status: DC | PRN

## 2016-09-02 MED ORDER — FENTANYL CITRATE (PF) 250 MCG/5ML IJ SOLN
INTRAMUSCULAR | Status: AC
  Filled 2016-09-02: qty 5

## 2016-09-02 MED ORDER — FENTANYL 40 MCG/ML IV SOLN
INTRAVENOUS | Status: DC
  Administered 2016-09-02: 1000 ug via INTRAVENOUS
  Administered 2016-09-02: 75 ug via INTRAVENOUS
  Administered 2016-09-02: 0 ug via INTRAVENOUS
  Administered 2016-09-03 (×2): 30 ug via INTRAVENOUS
  Administered 2016-09-03: 0 ug via INTRAVENOUS
  Administered 2016-09-03: 15 ug via INTRAVENOUS
  Administered 2016-09-03: 90 ug via INTRAVENOUS
  Administered 2016-09-03: 40 ug via INTRAVENOUS
  Administered 2016-09-04: 60 ug via INTRAVENOUS
  Administered 2016-09-04 (×2): 0 ug via INTRAVENOUS
  Administered 2016-09-04: 60 ug via INTRAVENOUS
  Administered 2016-09-04 (×2): 15 ug via INTRAVENOUS
  Administered 2016-09-05 (×2): 0 ug via INTRAVENOUS
  Administered 2016-09-05: 15 ug via INTRAVENOUS
  Administered 2016-09-05 (×3): 0 ug via INTRAVENOUS
  Administered 2016-09-05: 15 ug via INTRAVENOUS
  Administered 2016-09-06 (×2): 0 ug via INTRAVENOUS
  Filled 2016-09-02: qty 25

## 2016-09-02 MED ORDER — VANCOMYCIN HCL IN DEXTROSE 1-5 GM/200ML-% IV SOLN
1000.0000 mg | Freq: Two times a day (BID) | INTRAVENOUS | Status: AC
  Administered 2016-09-02: 1000 mg via INTRAVENOUS
  Filled 2016-09-02: qty 200

## 2016-09-02 MED ORDER — HEMOSTATIC AGENTS (NO CHARGE) OPTIME
TOPICAL | Status: DC | PRN
  Administered 2016-09-02: 1 via TOPICAL

## 2016-09-02 MED ORDER — HYDROMORPHONE HCL 1 MG/ML IJ SOLN
INTRAMUSCULAR | Status: AC
  Filled 2016-09-02: qty 0.5

## 2016-09-02 MED ORDER — SUGAMMADEX SODIUM 200 MG/2ML IV SOLN
INTRAVENOUS | Status: DC | PRN
  Administered 2016-09-02: 200 mg via INTRAVENOUS

## 2016-09-02 MED ORDER — 0.9 % SODIUM CHLORIDE (POUR BTL) OPTIME
TOPICAL | Status: DC | PRN
  Administered 2016-09-02: 2000 mL

## 2016-09-02 MED ORDER — MIDAZOLAM HCL 2 MG/2ML IJ SOLN
INTRAMUSCULAR | Status: AC
Start: 2016-09-02 — End: 2016-09-02
  Filled 2016-09-02: qty 2

## 2016-09-02 MED ORDER — LEVALBUTEROL HCL 0.63 MG/3ML IN NEBU
0.6300 mg | INHALATION_SOLUTION | Freq: Four times a day (QID) | RESPIRATORY_TRACT | Status: DC
  Administered 2016-09-02: 0.63 mg via RESPIRATORY_TRACT
  Filled 2016-09-02: qty 3

## 2016-09-02 MED ORDER — PHENYLEPHRINE HCL 10 MG/ML IJ SOLN
INTRAVENOUS | Status: DC | PRN
  Administered 2016-09-02: 10 ug/min via INTRAVENOUS

## 2016-09-02 MED ORDER — BUPIVACAINE HCL (PF) 0.5 % IJ SOLN
INTRAMUSCULAR | Status: DC | PRN
  Administered 2016-09-02: 5 mL

## 2016-09-02 MED ORDER — ROCURONIUM BROMIDE 10 MG/ML (PF) SYRINGE
PREFILLED_SYRINGE | INTRAVENOUS | Status: DC | PRN
  Administered 2016-09-02: 20 mg via INTRAVENOUS
  Administered 2016-09-02: 30 mg via INTRAVENOUS
  Administered 2016-09-02 (×2): 10 mg via INTRAVENOUS
  Administered 2016-09-02: 50 mg via INTRAVENOUS
  Administered 2016-09-02: 20 mg via INTRAVENOUS
  Administered 2016-09-02: 30 mg via INTRAVENOUS
  Administered 2016-09-02: 50 mg via INTRAVENOUS
  Administered 2016-09-02: 10 mg via INTRAVENOUS

## 2016-09-02 MED ORDER — ACETAMINOPHEN 500 MG PO TABS
1000.0000 mg | ORAL_TABLET | Freq: Four times a day (QID) | ORAL | Status: AC
  Administered 2016-09-02 – 2016-09-07 (×19): 1000 mg via ORAL
  Filled 2016-09-02 (×20): qty 2

## 2016-09-02 MED ORDER — BISACODYL 5 MG PO TBEC
10.0000 mg | DELAYED_RELEASE_TABLET | Freq: Every day | ORAL | Status: DC
  Administered 2016-09-02 – 2016-09-07 (×6): 10 mg via ORAL
  Filled 2016-09-02 (×6): qty 2

## 2016-09-02 MED ORDER — ONDANSETRON HCL 4 MG/2ML IJ SOLN: 4.0000 mg | Freq: Once | INTRAMUSCULAR | Status: DC | PRN

## 2016-09-02 MED ORDER — SUGAMMADEX SODIUM 500 MG/5ML IV SOLN
INTRAVENOUS | Status: AC
  Filled 2016-09-02: qty 5

## 2016-09-02 MED ORDER — OXYCODONE HCL 5 MG/5ML PO SOLN: 5.0000 mg | Freq: Once | ORAL | Status: DC | PRN

## 2016-09-02 MED ORDER — NALOXONE HCL 0.4 MG/ML IJ SOLN: 0.4000 mg | INTRAMUSCULAR | Status: DC | PRN

## 2016-09-02 MED ORDER — DEXTROSE-NACL 5-0.9 % IV SOLN
INTRAVENOUS | Status: DC
  Administered 2016-09-02: 17:00:00 via INTRAVENOUS

## 2016-09-02 MED ORDER — BUPIVACAINE HCL (PF) 0.5 % IJ SOLN
INTRAMUSCULAR | Status: AC
  Filled 2016-09-02: qty 30

## 2016-09-02 MED ORDER — LACTATED RINGERS IV SOLN
INTRAVENOUS | Status: DC | PRN
  Administered 2016-09-02 (×3): via INTRAVENOUS

## 2016-09-02 MED ORDER — PROPOFOL 10 MG/ML IV BOLUS
INTRAVENOUS | Status: AC
  Filled 2016-09-02: qty 40

## 2016-09-02 MED ORDER — ONDANSETRON HCL 4 MG/2ML IJ SOLN
INTRAMUSCULAR | Status: AC
  Filled 2016-09-02: qty 2

## 2016-09-02 MED ORDER — SODIUM CHLORIDE 0.9 % IV SOLN: 10.0000 mL/h | Freq: Once | INTRAVENOUS | Status: DC

## 2016-09-02 MED ORDER — KETOROLAC TROMETHAMINE 15 MG/ML IJ SOLN
15.0000 mg | Freq: Four times a day (QID) | INTRAMUSCULAR | Status: AC
  Administered 2016-09-02 – 2016-09-04 (×8): 15 mg via INTRAVENOUS
  Filled 2016-09-02 (×7): qty 1

## 2016-09-02 MED ORDER — DIPHENHYDRAMINE HCL 12.5 MG/5ML PO ELIX: 12.5000 mg | ORAL_SOLUTION | Freq: Four times a day (QID) | ORAL | Status: DC | PRN

## 2016-09-02 MED ORDER — PROPOFOL 10 MG/ML IV BOLUS
INTRAVENOUS | Status: DC | PRN
  Administered 2016-09-02: 150 mg via INTRAVENOUS
  Administered 2016-09-02: 20 mg via INTRAVENOUS

## 2016-09-02 MED ORDER — PANTOPRAZOLE SODIUM 40 MG PO TBEC
40.0000 mg | DELAYED_RELEASE_TABLET | Freq: Every day | ORAL | Status: DC
  Administered 2016-09-03 – 2016-09-07 (×5): 40 mg via ORAL
  Filled 2016-09-02 (×5): qty 1

## 2016-09-02 MED ORDER — ONDANSETRON HCL 4 MG/2ML IJ SOLN: 4.0000 mg | Freq: Four times a day (QID) | INTRAMUSCULAR | Status: DC | PRN

## 2016-09-02 MED ORDER — MIDAZOLAM HCL 5 MG/5ML IJ SOLN
INTRAMUSCULAR | Status: DC | PRN
  Administered 2016-09-02 (×2): 1 mg via INTRAVENOUS

## 2016-09-02 MED ORDER — SENNOSIDES-DOCUSATE SODIUM 8.6-50 MG PO TABS
1.0000 | ORAL_TABLET | Freq: Every day | ORAL | Status: DC
  Administered 2016-09-02 – 2016-09-06 (×5): 1 via ORAL
  Filled 2016-09-02 (×5): qty 1

## 2016-09-02 MED ORDER — ORAL CARE MOUTH RINSE
15.0000 mL | Freq: Two times a day (BID) | OROMUCOSAL | Status: DC
  Administered 2016-09-02 – 2016-09-07 (×5): 15 mL via OROMUCOSAL

## 2016-09-02 MED ORDER — FENTANYL CITRATE (PF) 100 MCG/2ML IJ SOLN
INTRAMUSCULAR | Status: DC | PRN
  Administered 2016-09-02 (×5): 50 ug via INTRAVENOUS
  Administered 2016-09-02 (×3): 100 ug via INTRAVENOUS
  Administered 2016-09-02 (×4): 50 ug via INTRAVENOUS

## 2016-09-02 MED ORDER — SUCCINYLCHOLINE CHLORIDE 200 MG/10ML IV SOSY
PREFILLED_SYRINGE | INTRAVENOUS | Status: AC
  Filled 2016-09-02: qty 10

## 2016-09-02 MED ORDER — EPHEDRINE SULFATE-NACL 50-0.9 MG/10ML-% IV SOSY
PREFILLED_SYRINGE | INTRAVENOUS | Status: DC | PRN
Start: 2016-09-02 — End: 2016-09-02
  Administered 2016-09-02 (×4): 5 mg via INTRAVENOUS

## 2016-09-02 MED ORDER — LACTATED RINGERS IV SOLN
INTRAVENOUS | Status: DC | PRN
  Administered 2016-09-02: 07:00:00 via INTRAVENOUS

## 2016-09-02 MED ORDER — LIDOCAINE 2% (20 MG/ML) 5 ML SYRINGE
INTRAMUSCULAR | Status: AC
  Filled 2016-09-02: qty 5

## 2016-09-02 MED ORDER — ACETAMINOPHEN 160 MG/5ML PO SOLN
1000.0000 mg | Freq: Four times a day (QID) | ORAL | Status: AC
  Administered 2016-09-06: 1000 mg via ORAL

## 2016-09-02 MED ORDER — BUPIVACAINE 0.5 % ON-Q PUMP SINGLE CATH 400 ML
400.0000 mL | INJECTION | Status: DC
  Filled 2016-09-02: qty 400

## 2016-09-02 MED ORDER — SODIUM CHLORIDE 0.9 % IV SOLN
30.0000 meq | Freq: Every day | INTRAVENOUS | Status: DC | PRN
  Administered 2016-09-03: 30 meq via INTRAVENOUS
  Filled 2016-09-02 (×2): qty 15

## 2016-09-02 MED ORDER — ALBUMIN HUMAN 5 % IV SOLN
INTRAVENOUS | Status: DC | PRN
  Administered 2016-09-02 (×2): via INTRAVENOUS

## 2016-09-02 MED ORDER — ARTIFICIAL TEARS OPHTHALMIC OINT
TOPICAL_OINTMENT | OPHTHALMIC | Status: AC
  Filled 2016-09-02: qty 3.5

## 2016-09-02 MED ORDER — HYDROMORPHONE HCL 1 MG/ML IJ SOLN
0.2500 mg | INTRAMUSCULAR | Status: DC | PRN
  Administered 2016-09-02 (×2): 0.5 mg via INTRAVENOUS

## 2016-09-02 MED ORDER — DIPHENHYDRAMINE HCL 50 MG/ML IJ SOLN: 12.5000 mg | Freq: Four times a day (QID) | INTRAMUSCULAR | Status: DC | PRN

## 2016-09-02 MED ORDER — ARTIFICIAL TEARS OPHTHALMIC OINT
TOPICAL_OINTMENT | OPHTHALMIC | Status: DC | PRN
  Administered 2016-09-02: 1 via OPHTHALMIC

## 2016-09-02 SURGICAL SUPPLY — 111 items
ADH SKN CLS APL DERMABOND .7 (GAUZE/BANDAGES/DRESSINGS) ×1
ADH SKN CLS LQ APL DERMABOND (GAUZE/BANDAGES/DRESSINGS) ×1
APL SKNCLS STERI-STRIP NONHPOA (GAUZE/BANDAGES/DRESSINGS)
APPLICATOR COTTON TIP 6IN STRL (MISCELLANEOUS) ×4 IMPLANT
APPLIER CLIP 5 13 M/L LIGAMAX5 (MISCELLANEOUS) ×3
APPLIER CLIP ROT 10 11.4 M/L (STAPLE) ×3
APR CLP MED LRG 11.4X10 (STAPLE) ×1
APR CLP MED LRG 5 ANG JAW (MISCELLANEOUS) ×1
BAG SPEC RTRVL LRG 6X4 10 (ENDOMECHANICALS) ×1
BENZOIN TINCTURE PRP APPL 2/3 (GAUZE/BANDAGES/DRESSINGS) ×1 IMPLANT
CANISTER SUCT 3000ML PPV (MISCELLANEOUS) ×4 IMPLANT
CATH KIT ON Q 5IN SLV (PAIN MANAGEMENT) ×2 IMPLANT
CATH THORACIC 28FR (CATHETERS) ×2 IMPLANT
CATH THORACIC 36FR (CATHETERS) IMPLANT
CATH THORACIC 36FR RT ANG (CATHETERS) IMPLANT
CLIP APPLIE 5 13 M/L LIGAMAX5 (MISCELLANEOUS) IMPLANT
CLIP APPLIE ROT 10 11.4 M/L (STAPLE) IMPLANT
CLIP TI MEDIUM 24 (CLIP) ×4 IMPLANT
CLIP TI MEDIUM 6 (CLIP) ×3 IMPLANT
CONN ST 1/4X3/8  BEN (MISCELLANEOUS) ×2
CONN ST 1/4X3/8 BEN (MISCELLANEOUS) IMPLANT
CONN Y 3/8X3/8X3/8  BEN (MISCELLANEOUS)
CONN Y 3/8X3/8X3/8 BEN (MISCELLANEOUS) IMPLANT
CONT SPEC 4OZ CLIKSEAL STRL BL (MISCELLANEOUS) ×18 IMPLANT
COVER SURGICAL LIGHT HANDLE (MISCELLANEOUS) ×1 IMPLANT
DERMABOND ADHESIVE PROPEN (GAUZE/BANDAGES/DRESSINGS) ×2
DERMABOND ADVANCED (GAUZE/BANDAGES/DRESSINGS) ×2
DERMABOND ADVANCED .7 DNX12 (GAUZE/BANDAGES/DRESSINGS) ×1 IMPLANT
DERMABOND ADVANCED .7 DNX6 (GAUZE/BANDAGES/DRESSINGS) IMPLANT
DRAIN CHANNEL 28F RND 3/8 FF (WOUND CARE) ×2 IMPLANT
DRAIN CHANNEL 32F RND 10.7 FF (WOUND CARE) IMPLANT
DRAPE LAPAROSCOPIC ABDOMINAL (DRAPES) ×3 IMPLANT
DRAPE WARM FLUID 44X44 (DRAPE) ×1 IMPLANT
ELECT BLADE 6.5 EXT (BLADE) ×3 IMPLANT
ELECT REM PT RETURN 9FT ADLT (ELECTROSURGICAL) ×3
ELECTRODE REM PT RTRN 9FT ADLT (ELECTROSURGICAL) ×1 IMPLANT
GAUZE SPONGE 4X4 12PLY STRL (GAUZE/BANDAGES/DRESSINGS) ×2 IMPLANT
GAUZE SPONGE 4X4 12PLY STRL LF (GAUZE/BANDAGES/DRESSINGS) ×2 IMPLANT
GLOVE BIOGEL PI IND STRL 6 (GLOVE) IMPLANT
GLOVE BIOGEL PI INDICATOR 6 (GLOVE) ×4
GLOVE SURG SIGNA 7.5 PF LTX (GLOVE) ×6 IMPLANT
GOWN STRL REUS W/ TWL LRG LVL3 (GOWN DISPOSABLE) ×2 IMPLANT
GOWN STRL REUS W/ TWL XL LVL3 (GOWN DISPOSABLE) ×2 IMPLANT
GOWN STRL REUS W/TWL LRG LVL3 (GOWN DISPOSABLE) ×6
GOWN STRL REUS W/TWL XL LVL3 (GOWN DISPOSABLE) ×3
HANDLE STAPLE ENDO GIA SHORT (STAPLE)
HEMOSTAT SURGICEL 2X14 (HEMOSTASIS) ×2 IMPLANT
KIT BASIN OR (CUSTOM PROCEDURE TRAY) ×3 IMPLANT
KIT ROOM TURNOVER OR (KITS) ×3 IMPLANT
KIT SUCTION CATH 14FR (SUCTIONS) ×1 IMPLANT
NS IRRIG 1000ML POUR BTL (IV SOLUTION) ×7 IMPLANT
PACK CHEST (CUSTOM PROCEDURE TRAY) ×3 IMPLANT
PAD ARMBOARD 7.5X6 YLW CONV (MISCELLANEOUS) ×6 IMPLANT
POUCH ENDO CATCH II 15MM (MISCELLANEOUS) IMPLANT
POUCH SPECIMEN RETRIEVAL 10MM (ENDOMECHANICALS) ×2 IMPLANT
RELOAD STAPLE 35X2.5 WHT THIN (STAPLE) IMPLANT
RELOAD STAPLE 60 3.8 GOLD REG (STAPLE) IMPLANT
RELOAD STAPLE 60 4.1 GRN THCK (STAPLE) IMPLANT
RELOAD STAPLER GOLD 60MM (STAPLE) ×16 IMPLANT
RELOAD STAPLER GREEN 60MM (STAPLE) ×1 IMPLANT
SCISSORS ENDO CVD 5DCS (MISCELLANEOUS) IMPLANT
SEALANT PROGEL (MISCELLANEOUS) IMPLANT
SEALANT SURG COSEAL 4ML (VASCULAR PRODUCTS) IMPLANT
SEALANT SURG COSEAL 8ML (VASCULAR PRODUCTS) IMPLANT
SHEARS HARMONIC HDI 36CM (ELECTROSURGICAL) ×2 IMPLANT
SOLUTION ANTI FOG 6CC (MISCELLANEOUS) ×3 IMPLANT
SPECIMEN JAR MEDIUM (MISCELLANEOUS) ×3 IMPLANT
SPONGE INTESTINAL PEANUT (DISPOSABLE) ×14 IMPLANT
SPONGE TONSIL 1 RF SGL (DISPOSABLE) ×3 IMPLANT
STAPLE ECHEON FLEX 60 POW ENDO (STAPLE) ×2 IMPLANT
STAPLE RELOAD 2.5MM WHITE (STAPLE) ×15 IMPLANT
STAPLER ENDO GIA 12 SHRT THIN (STAPLE) IMPLANT
STAPLER ENDO GIA 12MM SHORT (STAPLE) IMPLANT
STAPLER RELOAD GOLD 60MM (STAPLE) ×48
STAPLER RELOAD GREEN 60MM (STAPLE) ×3
STAPLER VASCULAR ECHELON 35 (CUTTER) ×2 IMPLANT
SUT PROLENE 4 0 RB 1 (SUTURE) ×9
SUT PROLENE 4-0 RB1 .5 CRCL 36 (SUTURE) IMPLANT
SUT SILK  1 MH (SUTURE) ×4
SUT SILK 1 MH (SUTURE) ×2 IMPLANT
SUT SILK 1 TIES 10X30 (SUTURE) ×3 IMPLANT
SUT SILK 2 0 SH (SUTURE) IMPLANT
SUT SILK 2 0SH CR/8 30 (SUTURE) IMPLANT
SUT SILK 3 0 SH 30 (SUTURE) ×2 IMPLANT
SUT SILK 3 0SH CR/8 30 (SUTURE) ×3 IMPLANT
SUT VIC AB 0 CTX 27 (SUTURE) IMPLANT
SUT VIC AB 1 CTX 27 (SUTURE) ×3 IMPLANT
SUT VIC AB 2-0 CT1 27 (SUTURE)
SUT VIC AB 2-0 CT1 TAPERPNT 27 (SUTURE) IMPLANT
SUT VIC AB 2-0 CTX 27 (SUTURE) ×2 IMPLANT
SUT VIC AB 2-0 CTX 36 (SUTURE) ×3 IMPLANT
SUT VIC AB 3-0 MH 27 (SUTURE) IMPLANT
SUT VIC AB 3-0 SH 27 (SUTURE) ×3
SUT VIC AB 3-0 SH 27X BRD (SUTURE) IMPLANT
SUT VIC AB 3-0 X1 27 (SUTURE) ×7 IMPLANT
SUT VICRYL 0 UR6 27IN ABS (SUTURE) IMPLANT
SUT VICRYL 2 TP 1 (SUTURE) ×2 IMPLANT
SWAB CULTURE ESWAB REG 1ML (MISCELLANEOUS) IMPLANT
SYSTEM SAHARA CHEST DRAIN ATS (WOUND CARE) ×3 IMPLANT
TAPE CLOTH SURG 4X10 WHT LF (GAUZE/BANDAGES/DRESSINGS) ×2 IMPLANT
TIP APPLICATOR SPRAY EXTEND 16 (VASCULAR PRODUCTS) IMPLANT
TOWEL GREEN STERILE (TOWEL DISPOSABLE) ×2 IMPLANT
TOWEL GREEN STERILE FF (TOWEL DISPOSABLE) ×3 IMPLANT
TRAY FOLEY W/METER SILVER 16FR (SET/KITS/TRAYS/PACK) ×3 IMPLANT
TROCAR XCEL BLADELESS 5X75MML (TROCAR) ×3 IMPLANT
TROCAR XCEL NON-BLD 5MMX100MML (ENDOMECHANICALS) IMPLANT
TUBE CONNECTING 20'X1/4 (TUBING) ×1
TUBE CONNECTING 20X1/4 (TUBING) ×1 IMPLANT
TUNNELER SHEATH ON-Q 11GX8 DSP (PAIN MANAGEMENT) ×2 IMPLANT
WATER STERILE IRR 1000ML POUR (IV SOLUTION) ×6 IMPLANT
YANKAUER SUCT BULB TIP NO VENT (SUCTIONS) ×2 IMPLANT

## 2016-09-02 NOTE — Brief Op Note (Addendum)
09/02/2016  12:54 PM  PATIENT:  Elenor Quinones  66 y.o. male  PRE-OPERATIVE DIAGNOSIS:  RUL NODULE  POST-OPERATIVE DIAGNOSIS:  ADENOCARCINOMA RIGHT UPPER LOBE  PROCEDURE:  Procedure(s): VIDEO ASSISTED THORACOSCOPY (VATS)/RIGHT UPPER LOBE WEDGE RESECTION (Right) RIGHT UPPER LOBECTOMY (Right) LYMPH NODE DISSECTION, RIGHT LUNG (Right)  On-Q Local anesthetic catheter placement  SURGEON:  Surgeon(s) and Role:    * Melrose Nakayama, MD - Primary  PHYSICIAN ASSISTANT: WAYNE GOLD PA-C  ANESTHESIA:   general  EBL:  Total I/O In: 3300 [I.V.:2800; IV Piggyback:500] Out: 3400 [Urine:900; Blood:2500]  BLOOD ADMINISTERED:none  DRAINS: 2 CHEST TUBES  LOCAL MEDICATIONS USED:  MARCAINE     SPECIMEN:  Source of Specimen:  RUL AND LN SAMPLES  DISPOSITION OF SPECIMEN:  PATHOLOGY  COUNTS:  YES  PLAN OF CARE: Admit to inpatient   PATIENT DISPOSITION:  ICU - intubated and hemodynamically stable.   Delay start of Pharmacological VTE agent (>24hrs) due to surgical blood loss or risk of bleeding: yes  COMPLICATIONS: NO KNOWN  PATH: FROZEN=ADENOCARCINOMA, Bronchial margin negative

## 2016-09-02 NOTE — Anesthesia Preprocedure Evaluation (Addendum)
Anesthesia Evaluation  Patient identified by MRN, date of birth, ID band Patient awake    Reviewed: Allergy & Precautions, NPO status , Patient's Chart, lab work & pertinent test results  Airway Mallampati: III  TM Distance: >3 FB Neck ROM: Limited    Dental  (+) Dental Advisory Given, Chipped, Teeth Intact,    Pulmonary sleep apnea and Continuous Positive Airway Pressure Ventilation ,    breath sounds clear to auscultation       Cardiovascular + CAD  + Valvular Problems/Murmurs MR  Rhythm:Regular Rate:Normal     Neuro/Psych PSYCHIATRIC DISORDERS Anxiety Depression    GI/Hepatic GERD  Medicated,  Endo/Other  diabetes  Renal/GU      Musculoskeletal  (+) Arthritis ,   Abdominal   Peds  Hematology   Anesthesia Other Findings   Reproductive/Obstetrics                          Anesthesia Physical Anesthesia Plan  ASA: III  Anesthesia Plan: General   Post-op Pain Management:    Induction: Intravenous  Airway Management Planned: Double Lumen EBT and Video Laryngoscope Planned  Additional Equipment: Arterial line and CVP  Intra-op Plan:   Post-operative Plan: Extubation in OR  Informed Consent: I have reviewed the patients History and Physical, chart, labs and discussed the procedure including the risks, benefits and alternatives for the proposed anesthesia with the patient or authorized representative who has indicated his/her understanding and acceptance.   Dental advisory given  Plan Discussed with: CRNA and Anesthesiologist  Anesthesia Plan Comments:        Anesthesia Quick Evaluation

## 2016-09-02 NOTE — Anesthesia Procedure Notes (Signed)
Central Venous Catheter Insertion Performed by: Oleta Mouse, anesthesiologist Start/End4/25/2018 7:13 AM, 09/02/2016 7:25 AM Patient location: Pre-op. Preanesthetic checklist: patient identified, IV checked, site marked, risks and benefits discussed, surgical consent, monitors and equipment checked, pre-op evaluation, timeout performed and anesthesia consent Lidocaine 1% used for infiltration and patient sedated Hand hygiene performed  and maximum sterile barriers used  Catheter size: 8 Fr Total catheter length 16. Central line was placed.Double lumen Procedure performed using ultrasound guided technique. Ultrasound Notes:image(s) printed for medical record Attempts: 1 Following insertion, dressing applied, line sutured and Biopatch. Post procedure assessment: blood return through all ports, free fluid flow and no air  Patient tolerated the procedure well with no immediate complications.

## 2016-09-02 NOTE — Anesthesia Procedure Notes (Signed)
Procedure Name: Intubation Date/Time: 09/02/2016 7:45 AM Performed by: Garrison Columbus T Pre-anesthesia Checklist: Patient identified, Emergency Drugs available, Suction available and Patient being monitored Patient Re-evaluated:Patient Re-evaluated prior to inductionOxygen Delivery Method: Circle System Utilized Preoxygenation: Pre-oxygenation with 100% oxygen Intubation Type: IV induction Ventilation: Mask ventilation without difficulty, Two handed mask ventilation required and Oral airway inserted - appropriate to patient size Laryngoscope Size: Glidescope and 4 Grade View: Grade I Tube type: Oral Tube size: 7.0 mm Number of attempts: 1 Airway Equipment and Method: Stylet and Oral airway Placement Confirmation: ETT inserted through vocal cords under direct vision,  positive ETCO2 and breath sounds checked- equal and bilateral Secured at: 23 cm Tube secured with: Tape Dental Injury: Teeth and Oropharynx as per pre-operative assessment

## 2016-09-02 NOTE — Progress Notes (Signed)
T[facility] BRIEF SICU PROGRESS NOTE  Day of Surgery  S/P Procedure(s) (LRB): VIDEO ASSISTED THORACOSCOPY (VATS)/RIGHT UPPER LOBE WEDGE RESECTION (Right) RIGHT UPPER LOBECTOMY (Right) LYMPH NODE DISSECTION, RIGHT LUNG (Right)   Looks good.  Expected soreness in chest.  Breathing comfortably O2 sats 96% on 2 L/min NSR w/ stable BP Low volume thin serosanguinous chest tube output, no air leak UOP adequate  Plan: Continue routine postop.  Will check CBG's and add SSI  Rexene Alberts, MD 09/02/2016 6:00 PM

## 2016-09-02 NOTE — Interval H&P Note (Signed)
History and Physical Interval Note:  09/02/2016 7:17 AM  James [name]  has presented today for surgery, with the diagnosis of RUL NODULE  The various methods of treatment have been discussed with the patient and family. After consideration of risks, benefits and other options for treatment, the patient has consented to  Procedure(s): VIDEO ASSISTED THORACOSCOPY (VATS)/WEDGE RESECTION (Right) LOBECTOMY (Right) as a surgical intervention .  The patient's history has been reviewed, patient examined, no change in status, stable for surgery.  I have reviewed the patient's chart and labs.  Questions were answered to the patient's satisfaction.     Melrose Nakayama

## 2016-09-02 NOTE — H&P (View-Only) (Signed)
PCP is [name], MD Referring Provider is [name], MD  Chief Complaint  Patient presents with  . Lung Lesion    Surgical eval, PET Scan 08/17/16, Chest CT 08/05/16, Spirometry 08/10/16    HPI: 66 yo man sent for consultation re: right upper lobe mass  [name] is a 66 yo man with a past history of arthritis, type II diabetes without complication, GERD, positive PPD, hemoptysis in 2011, OSA requiring CPAP, depression and chronic low back pain. He is a lifelong non-smoker. He was evaluated for hemoptysis in 2011. In 2014 he had a right upper lobe GGO noted on CT. He underwent ENB which was non-diagnostic. He had a pneumothorax requiring CT placement post procedure.  He was then lost to follow up.   He recently had a the "flu" and a sore throat. He was noted to have a new heart murmur on exam. Work up revealed severe mitral regurgitation. He had mild (65%) LAD disease at cath. EF was 55-65%. Right heart pressures were normal. A follow up CT showed the right upper lobe nodule had increased from 8 mm to 2.5 cm and was now solid and spiculated. A PET showed the nodule was hypermetabolic. There was no evidence of regional or distant metastases.  He is retired. He remains active physically. He walks about 4 miles a day. He has arthritis in his knee and has had a left hip replacement. He was told that he had a blood clot in the left leg at some point. He has sleep apnea and uses CPAP. He gets short of breath at times if he starts walking too fast but denies chest pain or pressure. He has had some tightness in his chest since he has learned of his issues.  No change in appetite or weight loss.  Zubrod Score: At the time of surgery this patient's most appropriate activity status/level should be described as: '[x]'$     0    Normal activity, no symptoms '[]'$     1    Restricted in physical strenuous activity but ambulatory, able to do out light work '[]'$     2    Ambulatory and capable of self care, unable  to do work activities, up and about >50 % of waking hours                              '[]'$     3    Only limited self care, in bed greater than 50% of waking hours '[]'$     4    Completely disabled, no self care, confined to bed or chair '[]'$     5    Moribund   Past Medical History:  Diagnosis Date  . Arthritis    "knees, hips, back" (10/19/2012)  . Chronic lower back pain   . Colon polyps    10/27/2002, repeat letter 09/17/2007  . Depressive disorder, not elsewhere classified   . Diabetes mellitus without complication ([place])   . Fasting hyperglycemia   . GERD (gastroesophageal reflux disease)   . Hemoptysis    abnormal CT Chest 01/29/10 - ? new GG changes RUL > not viz on plain cxr 02/26/2010  . MPN (myeloproliferative neoplasm) ([place])    1st detected 06/04/1998  . Obesity   . OSA on CPAP   . Other and unspecified hyperlipidemia   . Positive PPD 1965   "non reactive in 2012" (10/19/2012)  . Routine general medical examination at  a health care facility   . Special screening for malignant neoplasm of prostate   . Spinal stenosis, unspecified region other than cervical   . Wrist pain, left     Past Surgical History:  Procedure Laterality Date  . ANTERIOR CRUCIATE LIGAMENT REPAIR Left 1967  . CARDIAC CATHETERIZATION  2000  . CHEST TUBE INSERTION Right 10/19/2012   post bronch  . COLONOSCOPY W/ POLYPECTOMY    . FLEXIBLE BRONCHOSCOPY  10/19/2012   Flexible video fiberoptic bronchoscopy with electromagnetic navigation and biopsies.  . INTRAVASCULAR PRESSURE WIRE/FFR STUDY N/A 08/11/2016   Procedure: Intravascular Pressure Wire/FFR Study;  Surgeon: [name], MD;  Location: [facility];  Service: Cardiovascular;  Laterality: N/A;  . RIGHT/LEFT HEART CATH AND CORONARY ANGIOGRAPHY N/A 08/11/2016   Procedure: Right/Left Heart Cath and Coronary Angiography;  Surgeon: [name], MD;  Location: [facility];  Service: Cardiovascular;  Laterality: N/A;  . TEE WITHOUT CARDIOVERSION N/A  07/17/2016   Procedure: TRANSESOPHAGEAL ECHOCARDIOGRAM (TEE);  Surgeon: [name], MD;  Location: [facility];  Service: Cardiovascular;  Laterality: N/A;  . TONSILLECTOMY  1950's  . TOTAL HIP ARTHROPLASTY Left 10/05/2014   dr [name]  . TOTAL HIP ARTHROPLASTY Left 10/05/2014   Procedure: LEFT TOTAL HIP ARTHROPLASTY ANTERIOR APPROACH;  Surgeon: [name], MD;  Location: [facility];  Service: Orthopedics;  Laterality: Left;  Marland Kitchen VIDEO BRONCHOSCOPY WITH ENDOBRONCHIAL NAVIGATION N/A 10/19/2012   Procedure: VIDEO BRONCHOSCOPY WITH ENDOBRONCHIAL NAVIGATION;  Surgeon: [name], MD;  Location: [facility];  Service: Thoracic;  Laterality: N/A;  . WRIST RECONSTRUCTION  12/2009   'proximal row carpectomy" [name]    Family History  Problem Relation Age of Onset  . Heart disease Father   . Heart attack Father   . Heart disease Paternal Uncle   . Prostate cancer Neg Hx   . Colon cancer Neg Hx   . Hypertension Neg Hx   . Hyperlipidemia Neg Hx   . Diabetes Neg Hx     Social History Social History  Substance Use Topics  . Smoking status: Never Smoker  . Smokeless tobacco: Never Used  . Alcohol use 0.6 oz/week    1 Cans of beer per week    Current Outpatient Prescriptions  Medication Sig Dispense Refill  . aspirin EC 81 MG tablet Take 1 tablet (81 mg total) by mouth daily. 90 tablet 3  . Multiple Vitamin (MULTIVITAMIN) tablet Take 1 tablet by mouth daily.    . Omega-3 Fatty Acids (FISH OIL) 500 MG CAPS Take 500 mg by mouth.    Marland Kitchen atorvastatin (LIPITOR) 20 MG tablet Take 1 tablet (20 mg total) by mouth daily. (Patient not taking: Reported on 08/24/2016) 90 tablet 3   No current facility-administered medications for this visit.     Allergies  Allergen Reactions  . Amoxicillin Itching    Review of Systems  Constitutional: Negative for activity change, appetite change, chills, fever and unexpected weight change.  HENT: Negative for trouble swallowing and voice change.   Eyes: Negative for  visual disturbance.  Respiratory: Positive for apnea, chest tightness and shortness of breath. Negative for cough and wheezing.   Cardiovascular: Negative for chest pain and leg swelling.  Gastrointestinal: Positive for abdominal pain (reflux). Negative for abdominal distention and blood in stool.  Endocrine: Negative for polydipsia and polyphagia.  Genitourinary: Negative for difficulty urinating and dysuria.  Musculoskeletal: Positive for arthralgias and joint swelling.  Neurological: Negative for syncope, speech difficulty, weakness and headaches.  Hematological: Positive for adenopathy (under chin). Does not bruise/bleed easily.  Psychiatric/Behavioral: Positive for dysphoric mood.    BP 140/79   Pulse 66   Resp 20   Ht '5\' 9"'$  (1.753 m)   Wt 232 lb (105.2 kg)   SpO2 97% Comment: RA  BMI 34.26 kg/m  Physical Exam  Constitutional: He is oriented to person, place, and time. He appears well-nourished. No distress.  obese  HENT:  Head: Normocephalic and atraumatic.  Mouth/Throat: No oropharyngeal exudate.  Neck:  Submandibular node mildly enlarged and tender  Cardiovascular: Normal rate, regular rhythm and intact distal pulses.   Murmur heard. Pulmonary/Chest: Effort normal and breath sounds normal. No respiratory distress. He has no wheezes. He has no rales.  Abdominal: Soft. He exhibits no distension. There is no tenderness.  Musculoskeletal: He exhibits edema (2+ non pitting).  Neurological: He is alert and oriented to person, place, and time. No cranial nerve deficit.  No motor deficit  Vitals reviewed.    Diagnostic Tests: CT CHEST WITH CONTRAST  TECHNIQUE: Multidetector CT imaging of the chest was performed during intravenous contrast administration.  CONTRAST:  80 cc Isovue-300 IV.  COMPARISON:  11/10/2012 chest radiograph.   09/12/2012 chest CT.  FINDINGS: Cardiovascular: Normal heart size. No significant pericardial fluid/thickening. Left anterior  descending coronary atherosclerosis. Atherosclerotic nonaneurysmal thoracic aorta. Normal caliber pulmonary arteries. No acute central pulmonary emboli. Stable attenuated segmental left lower lobe pulmonary artery branch with curvilinear internal filling defects (series 3/image 88), unchanged since 09/12/2012, compatible with chronic pulmonary embolism.  Mediastinum/Nodes: No discrete thyroid nodules. Unremarkable esophagus. No pathologically enlarged axillary, mediastinal or hilar lymph nodes.  Lungs/Pleura: No pneumothorax. No pleural effusion. Irregular solid 2.5 x 1.8 cm anterior right upper lobe pulmonary nodule with spiculations extending to the anterior and mediastinal pleural surface (series 4/ image 50), increased in size and density from 09/12/2012 where it measured 2.0 x 1.3 cm using similar measurement technique. Subpleural 4 mm posterior right upper lobe pulmonary nodule (series 4/ image 79) and peripheral right lower lobe 5 mm pulmonary nodule (series 4/ image 120) are stable since 09/12/2012 and considered benign. Stable scattered calcified tiny 2-3 mm granulomas throughout both lungs. New small focus of tree-in-bud opacity in the medial left upper lobe (series 4/ image 48). No acute consolidative airspace disease or additional significant pulmonary nodules.  Upper abdomen: Simple 4.8 cm inferior right liver lobe and 2.5 cm lateral segment left liver lobe cysts are stable. Partially visualized parapelvic renal cysts in the left kidney.  Musculoskeletal: No aggressive appearing focal osseous lesions. Moderate thoracic spondylosis.  IMPRESSION: 1. Irregular solid 2.5 cm anterior right upper lobe pulmonary nodule, increased in size and density since 09/12/2012 chest CT, most suggestive of primary bronchogenic adenocarcinoma. Multidisciplinary thoracic oncology consultation advised. 2. No thoracic lymphadenopathy or other findings of metastatic disease in the  chest. 3. Aortic atherosclerosis.  One vessel coronary atherosclerosis. 4. Mild tree-in-bud opacities in the medial left upper lobe are new and most suggestive of a nonspecific mild infectious or inflammatory bronchiolitis. 5. Stable findings suggestive of chronic pulmonary embolism within a segmental left lower lobe pulmonary artery branch.   Electronically Signed   By: Ilona Sorrel M.D.   On: 08/05/2016 16:03 NUCLEAR MEDICINE PET SKULL BASE TO THIGH  TECHNIQUE: 11.3 mCi F-18 FDG was injected intravenously. Full-ring PET imaging was performed from the skull base to thigh after the radiotracer. CT data was obtained and used for attenuation correction and anatomic localization.  FASTING BLOOD GLUCOSE:  Value:  120 mg/dl  COMPARISON:  08/05/2016 chest CT.  FINDINGS: NECK  No hypermetabolic lymph nodes in the neck.  Nonspecific mild asymmetric hypermetabolism in the left palatine tonsil/left tongue base region, without discrete CT correlate.  CHEST  Left anterior descending coronary atherosclerosis. Atherosclerotic nonaneurysmal thoracic aorta. No hypermetabolic or enlarged axillary, mediastinal or hilar lymph nodes. No pneumothorax. No pleural effusions.  Hypermetabolic solid 2.6 x 1.9 cm anterior right upper lobe pulmonary nodule (series 7/image 17) with max SUV 7.7.  Right lower lobe 4 mm solid pulmonary nodule (series 7/image 38), below PET resolution, stable since 09/12/2012, considered benign. No acute consolidative airspace disease or additional significant pulmonary nodules.  ABDOMEN/PELVIS  No abnormal hypermetabolic activity within the liver, pancreas, adrenal glands, or spleen. No hypermetabolic lymph nodes in the abdomen or pelvis. Two simple liver cysts, largest 4.8 cm in the posterior right liver lobe. Simple parapelvic renal cysts in the left kidney. No hydronephrosis. Simple appearing 2.9 cm renal cortical cyst in the lower left kidney.  Atherosclerotic nonaneurysmal abdominal aorta. Mildly enlarged prostate with nonspecific internal prostatic calcifications. Mild sigmoid diverticulosis.  SKELETON  No focal hypermetabolic activity to suggest skeletal metastasis. Left total hip arthroplasty.  IMPRESSION: 1. Hypermetabolic solid 2.6 cm anterior right upper lobe pulmonary nodule, compatible with prior bronchogenic carcinoma. 2. No hypermetabolic thoracic lymphadenopathy or distant metastatic disease. 3. Nonspecific mild asymmetric hypermetabolism in the left palatine tonsil/left tongue base region, without discrete CT correlate. Recommend correlation with direct visualization. 4. Additional findings include aortic atherosclerosis, coronary atherosclerosis, mild sigmoid diverticulosis and mildly enlarged prostate.   Electronically Signed   By: Ilona Sorrel M.D.   On: 08/17/2016 09:56  Spirometry FVC= 3.86(88%) FEV1= 2.91(90%)  Impression: Mr. Deruiter is a 66 year old nonsmoker who recently was found to have severe mitral regurgitation and moderate single-vessel coronary disease. He is asymptomatic from those. He has a long-standing history of a right upper lobe nodule. He had a navigational bronchoscopy which was nondiagnostic for years ago. That was complicated by pneumothorax requiring chest tube placement. He then was lost to follow-up. Her recent CT showed the nodule had increased in size dramatically from less than a centimeter to 2.6 cm. The nodule was hypermetabolic on PET. There is no evidence of regional or distant metastases. This nodule is highly suspicious for a primary bronchogenic carcinoma and has to be considered that, unless it can be proven otherwise.  I recommended that we proceed with right VATS for wedge resection and then possible right upper lobectomy if the nodule was positive on frozen section. I described the general nature of the procedure to Mr. Southwood and his wife. They are aware of the  need for general anesthesia and the incisions to be used. We discussed the expected hospital stay, overall recovery and short and long term outcomes. I informed them of the indications, risks, benefits and alternatives. They understand the risks include, but are not limited to death, stroke, MI, DVT/PE, bleeding, possible need for transfusion, infections, prolonged air leaks, conversion to open thoracotomy, chronic pain, as well as other organ system dysfunction including respiratory, renal, or GI complications.   He accept the risks and agrees to proceed.  Plan: MR brain as planned 4/24  Right VATS, wedge resection, possible right upper lobectomy on Wed 4/25  Melrose Nakayama, MD [facility] [phone]

## 2016-09-02 NOTE — Anesthesia Procedure Notes (Signed)
Procedure Name: Intubation Date/Time: 09/02/2016 7:52 AM Performed by: Garrison Columbus T Pre-anesthesia Checklist: Patient identified, Emergency Drugs available, Suction available and Patient being monitored Patient Re-evaluated:Patient Re-evaluated prior to inductionOxygen Delivery Method: Circle System Utilized Preoxygenation: Pre-oxygenation with 100% oxygen Intubation Type: Inhalational induction Endobronchial tube: Double lumen EBT, Left, EBT position confirmed by auscultation and EBT position confirmed by fiberoptic bronchoscope and 37 Fr Number of attempts: 1 Airway Equipment and Method: Oral airway,  Video-laryngoscopy,  Fiberoptic brochoscope and Bougie stylet Placement Confirmation: ETT inserted through vocal cords under direct vision,  positive ETCO2 and breath sounds checked- equal and bilateral Tube secured with: Tape Dental Injury: Teeth and Oropharynx as per pre-operative assessment

## 2016-09-02 NOTE — Anesthesia Postprocedure Evaluation (Addendum)
Anesthesia Post Note  Patient: James [name]  Procedure(s) Performed: Procedure(s) (LRB): VIDEO ASSISTED THORACOSCOPY (VATS)/RIGHT UPPER LOBE WEDGE RESECTION (Right) RIGHT UPPER LOBECTOMY (Right) LYMPH NODE DISSECTION, RIGHT LUNG (Right)  Patient location during evaluation: PACU Anesthesia Type: General Level of consciousness: awake, awake and alert and oriented Pain management: pain level controlled Vital Signs Assessment: post-procedure vital signs reviewed and stable Respiratory status: spontaneous breathing and respiratory function stable Cardiovascular status: blood pressure returned to baseline       Last Vitals:  Vitals:   09/02/16 1800 09/02/16 1815  BP: (!) 111/58 105/72  Pulse: 85 84  Resp: (!) 24 16  Temp:      Last Pain:  Vitals:   09/02/16 1821  TempSrc:   PainSc: 2                  Amberli Ruegg COKER

## 2016-09-02 NOTE — Transfer of Care (Signed)
Immediate Anesthesia Transfer of Care Note  Patient: Elenor Quinones  Procedure(s) Performed: Procedure(s): VIDEO ASSISTED THORACOSCOPY (VATS)/RIGHT UPPER LOBE WEDGE RESECTION (Right) RIGHT UPPER LOBECTOMY (Right) LYMPH NODE DISSECTION, RIGHT LUNG (Right)  Patient Location: PACU  Anesthesia Type:General  Level of Consciousness: awake and alert   Airway & Oxygen Therapy: Patient Spontanous Breathing and Patient connected to face mask oxygen  Post-op Assessment: Report given to RN, Post -op Vital signs reviewed and stable and Patient moving all extremities X 4  Post vital signs: Reviewed and stable  Last Vitals:  Vitals:   09/02/16 0630 09/02/16 1326  BP: 133/68   Pulse: 69   Resp: 20   Temp: 36.7 C 36.2 C    Last Pain: There were no vitals filed for this visit.       Complications: No apparent anesthesia complications

## 2016-09-03 ENCOUNTER — Encounter (HOSPITAL_COMMUNITY): Payer: Self-pay | Admitting: Thoracic Surgery (Cardiothoracic Vascular Surgery)

## 2016-09-03 ENCOUNTER — Inpatient Hospital Stay (HOSPITAL_COMMUNITY): Payer: Medicare Other

## 2016-09-03 LAB — GLUCOSE, CAPILLARY
Glucose-Capillary: 127 mg/dL — ABNORMAL HIGH (ref 65–99)
Glucose-Capillary: 138 mg/dL — ABNORMAL HIGH (ref 65–99)
Glucose-Capillary: 130 mg/dL — ABNORMAL HIGH (ref 65–99)
Glucose-Capillary: 132 mg/dL — ABNORMAL HIGH (ref 65–99)
Glucose-Capillary: 136 mg/dL — ABNORMAL HIGH (ref 65–99)

## 2016-09-03 LAB — POCT I-STAT 4, (NA,K, GLUC, HGB,HCT)
Glucose, Bld: 163 mg/dL — ABNORMAL HIGH (ref 65–99)
Glucose, Bld: 163 mg/dL — ABNORMAL HIGH (ref 65–99)
HCT: 37 % — ABNORMAL LOW (ref 39.0–52.0)
HCT: 33 % — ABNORMAL LOW (ref 39.0–52.0)
Hemoglobin: 12.6 g/dL — ABNORMAL LOW (ref 13.0–17.0)
Hemoglobin: 11.2 g/dL — ABNORMAL LOW (ref 13.0–17.0)
Potassium: 4.2 mmol/L (ref 3.5–5.1)
Potassium: 3.8 mmol/L (ref 3.5–5.1)
Sodium: 139 mmol/L (ref 135–145)
Sodium: 139 mmol/L (ref 135–145)

## 2016-09-03 LAB — CBC
HCT: 28.1 % — ABNORMAL LOW (ref 39.0–52.0)
Hemoglobin: 9.3 g/dL — ABNORMAL LOW (ref 13.0–17.0)
MCH: 29.9 pg (ref 26.0–34.0)
MCHC: 33.1 g/dL (ref 30.0–36.0)
MCV: 90.4 fL (ref 78.0–100.0)
Platelets: 133 10*3/uL — ABNORMAL LOW (ref 150–400)
RBC: 3.11 MIL/uL — ABNORMAL LOW (ref 4.22–5.81)
RDW: 14.2 % (ref 11.5–15.5)
WBC: 9.8 10*3/uL (ref 4.0–10.5)

## 2016-09-03 LAB — BASIC METABOLIC PANEL
Anion gap: 8 (ref 5–15)
BUN: 9 mg/dL (ref 6–20)
CO2: 24 mmol/L (ref 22–32)
Calcium: 7.9 mg/dL — ABNORMAL LOW (ref 8.9–10.3)
Chloride: 107 mmol/L (ref 101–111)
Creatinine, Ser: 1 mg/dL (ref 0.61–1.24)
GFR calc Af Amer: >60 mL/min (ref >60)
GFR calc non Af Amer: >60 mL/min (ref >60)
Glucose, Bld: 135 mg/dL — ABNORMAL HIGH (ref 65–99)
Potassium: 3.4 mmol/L — ABNORMAL LOW (ref 3.5–5.1)
Sodium: 139 mmol/L (ref 135–145)

## 2016-09-03 MED ORDER — ENOXAPARIN SODIUM 40 MG/0.4ML ~~LOC~~ SOLN
40.0000 mg | Freq: Every day | SUBCUTANEOUS | Status: DC
  Administered 2016-09-03 – 2016-09-07 (×5): 40 mg via SUBCUTANEOUS
  Filled 2016-09-03 (×5): qty 0.4

## 2016-09-03 MED ORDER — ATORVASTATIN CALCIUM 20 MG PO TABS
20.0000 mg | ORAL_TABLET | Freq: Every day | ORAL | Status: DC
  Administered 2016-09-03 – 2016-09-07 (×5): 20 mg via ORAL
  Filled 2016-09-03 (×5): qty 1

## 2016-09-03 MED ORDER — LEVALBUTEROL HCL 0.63 MG/3ML IN NEBU: 0.6300 mg | INHALATION_SOLUTION | Freq: Three times a day (TID) | RESPIRATORY_TRACT | Status: DC | PRN

## 2016-09-03 MED ORDER — SODIUM CHLORIDE 0.45 % IV SOLN
INTRAVENOUS | Status: DC
  Administered 2016-09-03 – 2016-09-05 (×2): via INTRAVENOUS

## 2016-09-03 MED ORDER — LEVALBUTEROL HCL 0.63 MG/3ML IN NEBU
0.6300 mg | INHALATION_SOLUTION | Freq: Three times a day (TID) | RESPIRATORY_TRACT | Status: DC
  Administered 2016-09-03: 0.63 mg via RESPIRATORY_TRACT
  Filled 2016-09-03: qty 3

## 2016-09-03 MED ORDER — ASPIRIN EC 81 MG PO TBEC
81.0000 mg | DELAYED_RELEASE_TABLET | Freq: Every day | ORAL | Status: DC
  Administered 2016-09-03 – 2016-09-07 (×5): 81 mg via ORAL
  Filled 2016-09-03 (×5): qty 1

## 2016-09-03 MED ORDER — INSULIN ASPART 100 UNIT/ML ~~LOC~~ SOLN
0.0000 [IU] | Freq: Three times a day (TID) | SUBCUTANEOUS | Status: DC
  Administered 2016-09-03 (×3): 2 [IU] via SUBCUTANEOUS
  Administered 2016-09-04: 3 [IU] via SUBCUTANEOUS
  Administered 2016-09-05 – 2016-09-06 (×3): 2 [IU] via SUBCUTANEOUS

## 2016-09-03 NOTE — Care Management Note (Signed)
Case Management Note  Patient Details  Name: James [name] MRN: [id number] Date of Birth: 1950-12-31  Subjective/Objective:     POD 1 VATS,   Wedge resection, lobectomy,and lymph node dissection.   Chest tubes x 2 to suction, fentanyl pca,              Action/Plan:  NCM will follow for dc needs.  Expected Discharge Date:                  Expected Discharge Plan:  Home/Self Care  In-House Referral:     Discharge planning Services  CM Consult  Post Acute Care Choice:    Choice offered to:     DME Arranged:    DME Agency:     HH Arranged:    HH Agency:     Status of Service:  In process, will continue to follow  If discussed at Long Length of Stay Meetings, dates discussed:    Additional Comments:  Zenon Mayo, RN 09/03/2016, 3:56 PM

## 2016-09-03 NOTE — Progress Notes (Signed)
1 Day Post-Op Procedure(s) (LRB): VIDEO ASSISTED THORACOSCOPY (VATS)/RIGHT UPPER LOBE WEDGE RESECTION (Right) RIGHT UPPER LOBECTOMY (Right) LYMPH NODE DISSECTION, RIGHT LUNG (Right) Subjective: c/o incisional pain, denies nausea but "not hungry"  Objective: Vital signs in last 24 hours: Temp:  [97.1 F (36.2 C)-98.6 F (37 C)] 98.2 F (36.8 C) (04/26 0400) Pulse Rate:  [61-96] 71 (04/26 0745) Cardiac Rhythm: Normal sinus rhythm (04/26 0400) Resp:  [15-29] 22 (04/26 0745) BP: (92-123)/(46-95) 112/66 (04/26 0745) SpO2:  [94 %-100 %] 100 % (04/26 0745) Arterial Line BP: (70-143)/(45-91) 101/90 (04/25 2000) Weight:  [233 lb 11 oz (106 kg)] 233 lb 11 oz (106 kg) (04/25 1555)  Hemodynamic parameters for last 24 hours:    Intake/Output from previous day: 04/25 0701 - 04/26 0700 In: 5655 [P.O.:240; I.V.:4450; IV Piggyback:965] Out: 6381 [RRNHA:5790; Blood:2500; Chest Tube:570] Intake/Output this shift: No intake/output data recorded.  General appearance: alert, cooperative and no distress Neurologic: intact Heart: regular rate and rhythm and + murmur Lungs: clear to auscultation bilaterally no air leak, serosanguinous drainage  Lab Results:  Recent Labs  09/02/16 1404 09/03/16 0413  WBC 15.2* 9.8  HGB 11.6* 9.3*  HCT 34.0* 28.1*  PLT 152 133*   BMET:  Recent Labs  08/31/16 1059  09/02/16 1135 09/03/16 0413  NA 138  < > 139 139  K 4.6  < > 3.8 3.4*  CL 109  --   --  107  CO2 20*  --   --  24  GLUCOSE 126*  < > 163* 135*  BUN 21*  --   --  9  CREATININE 0.99  --   --  1.00  CALCIUM 9.2  --   --  7.9*  < > = values in this interval not displayed.  PT/INR:  Recent Labs  08/31/16 1059  LABPROT 12.9  INR 0.97   ABG    Component Value Date/Time   PHART 7.428 08/31/2016 1119   HCO3 22.7 08/31/2016 1119   TCO2 25 08/11/2016 0854   ACIDBASEDEF 1.1 08/31/2016 1119   O2SAT 97.3 08/31/2016 1119   CBG (last 3)   Recent Labs  09/02/16 1925 09/02/16 2335  09/03/16 0343  GLUCAP 197* 176* 127*    Assessment/Plan: S/P Procedure(s) (LRB): VIDEO ASSISTED THORACOSCOPY (VATS)/RIGHT UPPER LOBE WEDGE RESECTION (Right) RIGHT UPPER LOBECTOMY (Right) LYMPH NODE DISSECTION, RIGHT LUNG (Right) -CV- in SR and BP oK  RESP- CXR looks good, lungs clear, change nebs to PRN, IS  RENAL- hypokalemia- being supplemented IV  ENDO- CBG elevated- will change SSI to [facility] and HS   Anemia secondary to ABL- no indication for transfusion- follow  SCD + enoxaparin fro DVT prophylaxis   LOS: 1 day    Melrose Nakayama 09/03/2016

## 2016-09-03 NOTE — Op Note (Signed)
James [name], NG                ACCOUNT NO.:  [id number]  MEDICAL RECORD NO.:  [id number]  LOCATION:                                 FACILITY:  PHYSICIAN:  Revonda Standard. [name], M.D. DATE OF BIRTH:  DATE OF PROCEDURE:  09/02/2016 DATE OF DISCHARGE:                              OPERATIVE REPORT   PREOPERATIVE DIAGNOSIS:  Right upper lobe mass.  POSTOPERATIVE DIAGNOSIS:  Adenocarcinoma, right upper lobe.  PROCEDURE:   Right video-assisted thoracoscopy, Wedge resection, Mini thoracotomy, Right upper lobectomy, Mediastinal lymph node dissection, and  On-Q local anesthetic catheter placement.  SURGEON:  Revonda Standard. [name], MD.  ASSISTANT:  Jadene Pierini, P.A.-C.  ANESTHESIA:  General.  FINDINGS:  Mass in anterior superior portion of right upper lobe. Frozen section revealed adenocarcinoma. Mass did appear to involve the visceral pleura.  No abnormality of parietal pleura.  No pleural effusion.  Intense inflammatory reaction around multiple lymph nodes. Bleeding from right upper lobe pulmonary artery branch necessitated a conversion to a mini thoracotomy.  CLINICAL NOTE:  [name] is a 66 year old gentleman with a known right upper lobe lung nodule.  He recently was found to have a new heart murmur and was found to have severe mitral regurgitation.  A followup CT showed the right upper lobe nodule had increased from 8 mm to 2.5 cm and was now solid and spiculated.  It was hypermetabolic on PET.  There was no evidence of regional or distant metastases.  He was seen in consultation by Dr. Darylene Price regarding his mitral valve Regurgitation. He felt the lung mass was of more urgency.  I met with James [name]. Given that he has no significant heart failure symptoms at this time, I agree that proceeding with lung resection took precedence. The indications, risks, benefits, and alternatives were discussed in detail with the patient.  He understood and accepted the risks  and agreed to proceed.  OPERATIVE NOTE:  James [name] was brought to the preoperative holding area on September 02, 2016.  Anesthesia placed a central line and an arterial blood pressure monitoring line.  He was taken to the operating room, anesthetized, and intubated.  A Foley catheter was placed.  Sequential compression devices were placed on the calves for DVT prophylaxis. Intravenous antibiotics were administered.  He was anesthetized and intubated with a double-lumen endotracheal tube.  He was placed in a left lateral decubitus position and single lung ventilation of the left lung was initiated.  The right chest was prepped and draped in usual sterile fashion.  An incision was made in the seventh intercostal space in the midaxillary line.  A 5 mm port was inserted into the chest.  The thoracoscope was advanced into the chest.  There was good isolation of the right lung, although it was relatively slow to deflate.  There was no cross ventilation.  The fissures were incomplete.  There was an adhesion of the upper lobe to the anterior mediastinum.  This was a thin filmy adhesion.  It was not in the area the tumor.  It was divided with cautery.  The area of the mass then was visible due to invagination of the visceral pleura.  The mass was easily palpable.  A wedge resection was performed with sequential firings of the Echelon 60 mm stapler with gold cartridges.  The specimen was placed into an endoscopic retrieval bag, removed, and sent for frozen section.  While awaiting those results, an incision was made in the posterior lateral chest and an On-Q local anesthetic catheter was tunneled into a subpleural location.  It was primed with 5 mL of 0.5% Marcaine and then capped.  It was secured to skin with a 3-0 silk suture.  The frozen section returned showing adenocarcinoma.  The inferior pulmonary ligament was divided.  No lymph nodes were identified.  The pleural reflection was divided at  the hilum posteriorly initially and then anteriorly.  An attempt to identify the pulmonary artery in the fissure was stopped due to the fissure being incomplete and with no clear plane.  Dissection was carried anteriorly.  The right middle lobe vein branch was identified and preserved.  The right upper lobe vein branches were identified.  The more inferior branch was dissected out, encircled and divided with the endoscopic vascular stapler.  To do this, a second port incision was made anterior to the first to allow passage of the stapler with a good angle to take the vessel.  The more superior branch then was divided with the endoscopic vascular stapler as well.  The main pulmonary artery was now visible and overlying tissue was dissected off the anterior and posterior ascending branch of the pulmonary artery were identified.  The apical branch was identified more posteriorly.  Due to the angulation of these branches, it was felt best to try and divide them separately.  A lymph node was dissected off the more anterior branch.  There was a dense fibrous reaction around this node, but the anterior branch was encircled.  A stapler was placed across the branch and fired.  There was a tear in the branch proximally and there was significant bleeding from the branch.  Pressure was held on this branch to control the bleeding.  Attempts were made to clip the branch below the tear, but they were unsuccessful.  The decision was made to extend the incision to a length of approximately 10 cm and place a retractor to improve visualization and access.  After doing so, the vessel was controlled and oversewn with a 4-0 Prolene suture with good control of the bleeding.  The posterior ascending branch then was dissected out and divided.  Three clips were placed proximally and 1 clip distally and then the vessel was divided.  There was extensive nodal tissue and fibrous tissue around the more apical branch  and the bronchus.  This dissection was carried through slowly and nodes that were encountered during the dissection were removed and sent as separate specimens.  The minor fissure was completed with sequential firings of the Echelon stapler again using gold cartridges.  The major fissure was completed as well.  The bronchus then was encircled.  The Echelon stapler with a green cartridge was placed across the base of the right upper lobe bronchus and closed.  A test inflation showed good aeration of the lower and middle lobes.  The stapler then was fired transecting the bronchus. The vascular stapler then was used to transect the apical pulmonary arterial branch.  The specimen was removed and sent for frozen section of the bronchial margin, which returned negative for tumor.  The chest was copiously irrigated with warm saline.  A test inflation at 30 cm of  water revealed no leakage from the bronchial stump or the staple lines. The level 4R right paratracheal nodes then were dissected out and multiple nodes were found in this area.  These were sent for permanent pathology.  The middle lobe was inspected to be sure it was not torsed and then was tacked to the lower lobe using a stapler.  A 28-French Blake drain was placed through the original port incision and directed posteriorly.  A 28-French chest tube was placed through the more anterior port incision and directed anteriorly both to the apex.  Both were secured to the skin with #1 silk sutures.  The ribs were reapproximated with a #1 Vicryl pericostal suture.  The muscular fascia was closed with a #1 Vicryl suture.  The subcutaneous tissue and skin were closed in standard fashion.  The chest tubes were placed to suction.  The patient was placed back in a supine position.  He was then extubated in the operating room and taken to the postanesthetic care unit in good condition.     Revonda Standard [name], M.D.     SCH/MEDQ  D:   09/02/2016  T:  09/03/2016  Job:  [id number]

## 2016-09-04 ENCOUNTER — Inpatient Hospital Stay (HOSPITAL_COMMUNITY): Payer: Medicare Other

## 2016-09-04 LAB — COMPREHENSIVE METABOLIC PANEL
ALT: 33 U/L (ref 17–63)
AST: 57 U/L — ABNORMAL HIGH (ref 15–41)
Albumin: 2.7 g/dL — ABNORMAL LOW (ref 3.5–5.0)
Alkaline Phosphatase: 28 U/L — ABNORMAL LOW (ref 38–126)
Anion gap: 4 — ABNORMAL LOW (ref 5–15)
BUN: 10 mg/dL (ref 6–20)
CO2: 26 mmol/L (ref 22–32)
Calcium: 7.7 mg/dL — ABNORMAL LOW (ref 8.9–10.3)
Chloride: 109 mmol/L (ref 101–111)
Creatinine, Ser: 1.11 mg/dL (ref 0.61–1.24)
GFR calc Af Amer: >60 mL/min (ref >60)
GFR calc non Af Amer: >60 mL/min (ref >60)
Glucose, Bld: 105 mg/dL — ABNORMAL HIGH (ref 65–99)
Potassium: 3.9 mmol/L (ref 3.5–5.1)
Sodium: 139 mmol/L (ref 135–145)
Total Bilirubin: 0.4 mg/dL (ref 0.3–1.2)
Total Protein: 4.6 g/dL — ABNORMAL LOW (ref 6.5–8.1)

## 2016-09-04 LAB — CBC
HCT: 26.1 % — ABNORMAL LOW (ref 39.0–52.0)
Hemoglobin: 8.5 g/dL — ABNORMAL LOW (ref 13.0–17.0)
MCH: 29.8 pg (ref 26.0–34.0)
MCHC: 32.6 g/dL (ref 30.0–36.0)
MCV: 91.6 fL (ref 78.0–100.0)
Platelets: 117 10*3/uL — ABNORMAL LOW (ref 150–400)
RBC: 2.85 MIL/uL — ABNORMAL LOW (ref 4.22–5.81)
RDW: 14.4 % (ref 11.5–15.5)
WBC: 8.5 10*3/uL (ref 4.0–10.5)

## 2016-09-04 LAB — GLUCOSE, CAPILLARY
Glucose-Capillary: 105 mg/dL — ABNORMAL HIGH (ref 65–99)
Glucose-Capillary: 108 mg/dL — ABNORMAL HIGH (ref 65–99)
Glucose-Capillary: 174 mg/dL — ABNORMAL HIGH (ref 65–99)
Glucose-Capillary: 198 mg/dL — ABNORMAL HIGH (ref 65–99)

## 2016-09-04 MED ORDER — CYCLOBENZAPRINE HCL 10 MG PO TABS
5.0000 mg | ORAL_TABLET | Freq: Three times a day (TID) | ORAL | Status: DC | PRN
  Administered 2016-09-04 – 2016-09-06 (×2): 5 mg via ORAL
  Filled 2016-09-04 (×2): qty 1

## 2016-09-04 NOTE — Progress Notes (Addendum)
[address]       [place] [address]             [phone]      2 Days Post-Op Procedure(s) (LRB): VIDEO ASSISTED THORACOSCOPY (VATS)/RIGHT UPPER LOBE WEDGE RESECTION (Right) RIGHT UPPER LOBECTOMY (Right) LYMPH NODE DISSECTION, RIGHT LUNG (Right) Subjective: Feels pretty well, some incisional discomfort  Objective: Vital signs in last 24 hours: Temp:  [98.8 F (37.1 C)-99.3 F (37.4 C)] 99.1 F (37.3 C) (04/27 1219) Pulse Rate:  [66-80] 68 (04/27 1219) Cardiac Rhythm: Normal sinus rhythm (04/27 1219) Resp:  [19-33] 20 (04/27 1220) BP: (108-131)/(62-69) 120/65 (04/27 1219) SpO2:  [94 %-100 %] 97 % (04/27 1220)  Hemodynamic parameters for last 24 hours:    Intake/Output from previous day: 04/26 0701 - 04/27 0700 In: 875.3 [P.O.:600; I.V.:275.3] Out: 2670 [Urine:2250; Chest Tube:420] Intake/Output this shift: Total I/O In: 440 [P.O.:360; I.V.:80] Out: 110 [Chest Tube:110]  General appearance: alert, cooperative and no distress Heart: regular rate and rhythm Lungs: mildly dim right base Abdomen: benign Extremities: no calf swelling or edema Wound: incis healing well  Lab Results:  Recent Labs  09/03/16 0413 09/04/16 0427  WBC 9.8 8.5  HGB 9.3* 8.5*  HCT 28.1* 26.1*  PLT 133* 117*   BMET:  Recent Labs  09/03/16 0413 09/04/16 0427  NA 139 139  K 3.4* 3.9  CL 107 109  CO2 24 26  GLUCOSE 135* 105*  BUN 9 10  CREATININE 1.00 1.11  CALCIUM 7.9* 7.7*    PT/INR: No results for input(s): LABPROT, INR in the last 72 hours. ABG    Component Value Date/Time   PHART 7.428 08/31/2016 1119   HCO3 22.7 08/31/2016 1119   TCO2 25 08/11/2016 0854   ACIDBASEDEF 1.1 08/31/2016 1119   O2SAT 97.3 08/31/2016 1119   CBG (last 3)   Recent Labs  09/03/16 1330 09/03/16 1638 09/03/16 2110  GLUCAP 130* 132* 136*    Meds Scheduled Meds: . acetaminophen  1,000 mg Oral Q6H   Or  . acetaminophen (TYLENOL) oral liquid 160 mg/5 mL   1,000 mg Oral Q6H  . aspirin EC  81 mg Oral Daily  . atorvastatin  20 mg Oral Daily  . bisacodyl  10 mg Oral Daily  . enoxaparin (LOVENOX) injection  40 mg Subcutaneous Daily  . fentaNYL   Intravenous Q4H  . insulin aspart  0-15 Units Subcutaneous TID WC  . mouth rinse  15 mL Mouth Rinse BID  . pantoprazole  40 mg Oral Daily  . senna-docusate  1 tablet Oral QHS   Continuous Infusions: . sodium chloride 10 mL/hr at 09/03/16 0821  . potassium chloride (KCL MULTIRUN) 30 mEq in 265 mL IVPB 30 mEq (09/03/16 0630)   PRN Meds:.diphenhydrAMINE **OR** diphenhydrAMINE, levalbuterol, naloxone **AND** sodium chloride flush, ondansetron (ZOFRAN) IV, ondansetron (ZOFRAN) IV, potassium chloride (KCL MULTIRUN) 30 mEq in 265 mL IVPB  Xrays Dg Chest Port 1 View  Result Date: 09/04/2016 CLINICAL DATA:  Shortness of breath. EXAM: PORTABLE CHEST 1 VIEW COMPARISON:  09/03/2016 . FINDINGS: Right IJ line, right chest tubes in stable position. Heart size normal. Stable postsurgical changes right lung. No pleural effusion or pneumothorax. IMPRESSION: Right IJ line and right chest tubes in stable position. No pneumothorax. Postsurgical changes right lung. Chest is stable from prior exam . Electronically Signed   By: Marcello Moores  Register   On: 09/04/2016 07:55   Dg Chest Port 1 View  Result Date: 09/03/2016 CLINICAL  DATA:  Chest tube in place EXAM: PORTABLE CHEST 1 VIEW COMPARISON:  Yesterday FINDINGS: Right jugular venous catheter and right chest tubes are stable. Low volumes. Postoperative changes at the medial right lung base. Otherwise clear lungs. Normal heart size. No pneumothorax. IMPRESSION: Stable right chest tubes without pneumothorax. Electronically Signed   By: Marybelle Killings M.D.   On: 09/03/2016 07:57   Dg Chest Port 1 View  Result Date: 09/02/2016 CLINICAL DATA:  Prior lung surgery . EXAM: PORTABLE CHEST 1 VIEW COMPARISON:  08/31/2016 . FINDINGS: Right IJ line noted with tip over superior vena cava. Two  right chest tubes noted. No pneumothorax . Postsurgical changes right lung. Left lung clear. Heart size stable. Degenerative changes thoracic spine . IMPRESSION: 1. Right IJ line noted with tip over the superior vena cava. Two right chest tubes noted. No pneumothorax. 2. Postsurgical changes right lung.  No acute abnormality . Electronically Signed   By: Marcello Moores  Register   On: 09/02/2016 13:43    Assessment/Plan: S/P Procedure(s) (LRB): VIDEO ASSISTED THORACOSCOPY (VATS)/RIGHT UPPER LOBE WEDGE RESECTION (Right) RIGHT UPPER LOBECTOMY (Right) LYMPH NODE DISSECTION, RIGHT LUNG (Right)  1 doing well 2 hemodyn stable 3 no air leak, mod drainage yesterday- 630 cc, cont to H2O seal, poss d/c one tube soon. CXR is stable 4 push rehab and pulm toilet as able 5 H/H slightly down from previous- follow over time, no transfusion need currently 6 sugars adeq controlled - A1c is > 6, needs better diet control, will need outpatient management per primary MD  LOS: 2 days    GOLD,WAYNE E 09/04/2016 Patient seen and examined, agree with above No air leak- dc anterior CT, keep posterior tube until drainage < 250 ml/ day Increase ambulation Path- T1c, N0- stage I- patient informed  Revonda Standard. [name], MD [facility] [phone]

## 2016-09-04 NOTE — Plan of Care (Signed)
Problem: Pain Managment: Goal: General experience of comfort will improve Outcome: Progressing Encouraging patient to use PCA when in pain.   Problem: Nutrition: Goal: Adequate nutrition will be maintained Outcome: Progressing Ate 100% breakfast this AM.   Problem: Urinary Elimination: Goal: Ability to achieve and maintain adequate urine output will improve Outcome: Progressing Foley catheter removed this AM. Patient currently due to void.

## 2016-09-04 NOTE — Discharge Summary (Signed)
Physician Discharge Summary  Patient ID: ADRAIN NESBIT MRN: [id number] DOB/AGE: 09-09-1950 66 y.o.  Admit date: 09/02/2016 Discharge date: 09/07/2016  Admission Diagnoses:  Patient Active Problem List   Diagnosis Date Noted  . Non-rheumatic mitral regurgitation 07/02/2016  . OA (osteoarthritis) of hip 10/05/2014  . OSA on CPAP 07/02/2014  . Chronic venous insufficiency 07/02/2014  . BPH with obstruction/lower urinary tract symptoms 07/12/2013  . Allergic rhinitis 07/12/2013  . Mass of upper lobe of right lung 10/19/2012  . Routine health maintenance 07/31/2011  . Hypogonadism male 07/29/2011  . Obesity 06/08/2007  . Type II diabetes mellitus with manifestations ([place]) 06/08/2007  . Hyperlipidemia with target LDL less than 130 01/18/2007  . GERD 01/18/2007  . Spinal stenosis of lumbar region 01/18/2007   Discharge Diagnoses:  Stage I Adenocarcinoma Patient Active Problem List   Diagnosis Date Noted  . Adenocarcinoma of lung, right ([place]) 09/06/2016  . S/P lobectomy of lung 09/02/2016  . Non-rheumatic mitral regurgitation 07/02/2016  . OA (osteoarthritis) of hip 10/05/2014  . OSA on CPAP 07/02/2014  . Chronic venous insufficiency 07/02/2014  . BPH with obstruction/lower urinary tract symptoms 07/12/2013  . Allergic rhinitis 07/12/2013  . Mass of upper lobe of right lung 10/19/2012  . Routine health maintenance 07/31/2011  . Hypogonadism male 07/29/2011  . Obesity 06/08/2007  . Type II diabetes mellitus with manifestations ([place]) 06/08/2007  . Hyperlipidemia with target LDL less than 130 01/18/2007  . GERD 01/18/2007  . Spinal stenosis of lumbar region 01/18/2007   Discharged Condition: good  History of Present Illness:  Mr. Lascola is a 66 yo male with PMH of arthritis, Type II DM, GERD, and positive PPD, OSA on CPAP, depression, and low back pain.  He has never smoked, but was noted to have a right upper lobe ground glass opacity on CT scan.  He had an ENB at that time  which was non-diagnostic.  He developed a pneumothorax post procedure which required chest tube placement.  He was lost to follow up since that time.  The patient recently had the "flu" symptoms and a sore throat.  He was also noted to have a new heart murmur on his physical exam.  Workup showed the patient to have severe mitral regurgitation, mild LAD disease and a preserved EF.  CT scan was obtained to evaluate previous lung nodule and this showed this had increased in size from 8 mm to 2.5 cm.  PET CT scan showed the lesion to be hypermetabolic without evidence of regional or distant metastasis.  He was evaluated by Dr. [name] who was in agreement the patient would benefit from lung resection.  The risks and benefits of the procedure were explained to the patient and he was agreeable to proceed.  Hospital Course:   Mr. Pichardo presented to [facility] on 09/03/2016.  He was taken to the operating room and underwent Right VATS, Mini Thoracotomy, wedge resection RUL, completion lobectomy RUL, lymph node dissection, and insertion of On-Q anesthetic catheter.  He tolerated the procedure, was extubated, and taken to the PACU in stable condition.  The patient made consistent progress post operatively.  He was found to be hypokalemic and was treated with IV supplementation.  His chest tube was free from air leak and was placed on water seal on POD #1.  His anterior chest tube was removed on POD #2.  Follow up CXR remained stable and free from pneumothorax.  His chest tube output decreased over the next 48 hours.  His final chest tube was removed on POD #4.  Follow up CXR showed since chest tube removal there has been reaccumulation of a hydropneumothorax amounting to approximately 30-40% of the pleural space volume. Mild right perihilar subsegmental atelectasis. .  The patient is doing well.  He is ambulating on room air without complaints of increasing shortness of breath. He has been weaned off all IV  pain medication and is well controlled on an oral regimen.  He is ambulating independently.  He is tolerating a regular diet.  Patient instructed no strenuous activity and no driving until instructed otherwise. If he experiences worsening shortness of breath or increased pain on the right side, he is to call 911 or go to ED asap. As discussed with Dr. [name], patient is felt stable for discharge today.  Significant Diagnostic Studies:  CLINICAL DATA:  Status post chest tube removal.  EXAM: CHEST  2 VIEW  COMPARISON:  None in PACs  FINDINGS: Since chest tube removal there is been reaccumulation of an approximately 30-40% right-sided hydropneumothorax. There is patchy increased density in the right perihilar region more conspicuous today. There is no mediastinal shift. The left lung is clear. The heart and pulmonary vascularity are normal.  IMPRESSION: Since chest tube removal there has been reaccumulation of a hydropneumothorax amounting to approximately 30-40% of the pleural space volume. Mild right perihilar subsegmental atelectasis.  These results were called by telephone at the time of interpretation on 09/07/2016 at 7:12 am to Glean Salen, RN, who verbally acknowledged these results.   Electronically Signed   By: David  Martinique M.D.   On: 09/07/2016 07:16   Nuclear medicine:   1. Hypermetabolic solid 2.6 cm anterior right upper lobe pulmonary nodule, compatible with prior bronchogenic carcinoma. 2. No hypermetabolic thoracic lymphadenopathy or distant metastatic disease. 3. Nonspecific mild asymmetric hypermetabolism in the left palatine tonsil/left tongue base region, without discrete CT correlate. Recommend correlation with direct visualization. 4. Additional findings include aortic atherosclerosis, coronary atherosclerosis, mild sigmoid diverticulosis and mildly enlarged prostate.  Pathology:   1. Lung, wedge biopsy/resection, Right upper lobe -  ADENOCARCINOMA, 2.6 CM. - TUMOR INVOLVES SUBPLEURAL CONNECTIVE TISSUE. - TUMOR FOCALLY LESS THAN 0.1 CM FROM MARGIN. 2. Lung, resection (segmental or lobe), RUL - ADENOCARCINOMA, 0.5 CM ADJACENT TO WEDGE BIOPSY SITE. - MARGINS NOT INVOLVED. - NINE BENIGN LYMPH NODES (0/9). 3. Lymph node, biopsy, Level 13 - ONE BENIGN LYMPH NODE (0/1). 4. Lymph node, biopsy, Level 10 - ONE BENIGN LYMPH NODE (0/1). 5. Lymph node, biopsy, Level 10 #2 - ONE BENIGN LYMPH NODE (0/1). 6. Lymph node, biopsy, 4R - ONE BENIGN LYMPH NODE (0/1). 7. Lymph node, biopsy, 4R #2 - ONE BENIGN LYMPH NODE (0/1).  Treatments: surgery:    Right video-assisted thoracoscopy, wedge resection, mini thoracotomy, right upper lobectomy with mediastinal lymph node dissection, and On-Q local anesthetic catheter placement.  Disposition: 01-Home or Self Care   Discharge Medications:    Allergies as of 09/07/2016      Reactions   Amoxicillin Itching   Has patient had a PCN reaction causing immediate rash, facial/tongue/throat swelling, SOB or lightheadedness with hypotension: No Has patient had a PCN reaction causing severe rash involving mucus membranes or skin necrosis: No Has patient had a PCN reaction that required hospitalization No Has patient had a PCN reaction occurring within the last 10 years: No If all of the above answers are "NO", then may proceed with Cephalosporin use.      Medication List    TAKE  these medications   ALLERGY RELIEF EYE DROPS OP Apply 2 drops to eye daily as needed (allergy).   aspirin EC 81 MG tablet Take 1 tablet (81 mg total) by mouth daily.   atorvastatin 20 MG tablet Commonly known as:  LIPITOR Take 1 tablet (20 mg total) by mouth daily.   Fish Oil 500 MG Caps Take 500 mg by mouth daily.   FLAXSEED (LINSEED) PO Take 20 mLs by mouth daily. Sprinkled in drink daily   FUNGI-NAIL TOE & FOOT EX Apply 1 application topically daily.   multivitamin tablet Take 1 tablet by mouth  daily.   naproxen sodium 220 MG tablet Commonly known as:  ANAPROX Take 220 mg by mouth 2 (two) times daily as needed (pain).   oxyCODONE 5 MG immediate release tablet Commonly known as:  Oxy IR/ROXICODONE Take 5 mg by mouth every 4-6 hours PRN severe pain   psyllium 58.6 % packet Commonly known as:  METAMUCIL Take 1 packet by mouth daily.   sodium chloride 0.65 % Soln nasal spray Commonly known as:  OCEAN Place 2 sprays into both nostrils at bedtime.      Follow-up Information    Melrose Nakayama, MD Follow up on 09/22/2016.   Specialty:  Cardiothoracic Surgery Why:  PA/LAT CXR to be taken (at [facility] which is in the same building as Dr. Everrett Coombe office) on 09/22/2016 at 12:15 pm;Appointment is at 12:45 Contact information: [place] [address] [phone]           Signed: Nani Skillern PA-C 09/07/2016, 3:07 PM

## 2016-09-05 ENCOUNTER — Inpatient Hospital Stay (HOSPITAL_COMMUNITY): Payer: Medicare Other

## 2016-09-05 LAB — GLUCOSE, CAPILLARY
Glucose-Capillary: 111 mg/dL — ABNORMAL HIGH (ref 65–99)
Glucose-Capillary: 139 mg/dL — ABNORMAL HIGH (ref 65–99)
Glucose-Capillary: 107 mg/dL — ABNORMAL HIGH (ref 65–99)
Glucose-Capillary: 126 mg/dL — ABNORMAL HIGH (ref 65–99)

## 2016-09-05 MED ORDER — MENTHOL 3 MG MT LOZG: 1.0000 | LOZENGE | OROMUCOSAL | Status: DC | PRN

## 2016-09-05 MED ORDER — TRAMADOL HCL 50 MG PO TABS
50.0000 mg | ORAL_TABLET | Freq: Four times a day (QID) | ORAL | Status: DC | PRN
  Administered 2016-09-05: 50 mg via ORAL
  Administered 2016-09-06: 100 mg via ORAL
  Administered 2016-09-06: 50 mg via ORAL
  Filled 2016-09-05: qty 1
  Filled 2016-09-05: qty 2
  Filled 2016-09-05: qty 1

## 2016-09-05 MED ORDER — OXYCODONE HCL 5 MG PO TABS: 5.0000 mg | ORAL_TABLET | ORAL | Status: DC | PRN

## 2016-09-05 MED ORDER — LACTULOSE 10 GM/15ML PO SOLN
20.0000 g | Freq: Every day | ORAL | Status: DC | PRN
  Filled 2016-09-05: qty 30

## 2016-09-05 NOTE — Progress Notes (Signed)
Patient complained of slight chest pain while ambulating in hall. Performed EKG, was normal. Paged PA regarding results, received response to monitor and notify MD on rounds.

## 2016-09-05 NOTE — Progress Notes (Signed)
Removed ONQ at 1470, no complications. Dressing replaced

## 2016-09-05 NOTE — Progress Notes (Addendum)
      [address]       [place] [address]             [phone]      3 Days Post-Op Procedure(s) (LRB): VIDEO ASSISTED THORACOSCOPY (VATS)/RIGHT UPPER LOBE WEDGE RESECTION (Right) RIGHT UPPER LOBECTOMY (Right) LYMPH NODE DISSECTION, RIGHT LUNG (Right)   Subjective:  James [name] has no new complaints.  States he has been doing better with pain, and hasn't had to use his PCA since last night.  He is ambulating without much difficulty.  No BM  Objective: Vital signs in last 24 hours: Temp:  [98.2 F (36.8 C)-99.9 F (37.7 C)] 99.7 F (37.6 C) (04/28 0817) Pulse Rate:  [63-82] 63 (04/28 0817) Cardiac Rhythm: Normal sinus rhythm (04/28 0830) Resp:  [19-27] 19 (04/28 0817) BP: (119-135)/(62-74) 123/74 (04/28 0817) SpO2:  [96 %-100 %] 99 % (04/28 0817)  Intake/Output from previous day: 04/27 0701 - 04/28 0700 In: 1200 [P.O.:960; I.V.:240] Out: 1970 [Urine:1650; Chest Tube:320] Intake/Output this shift: Total I/O In: 60 [I.V.:60] Out: 80 [Chest Tube:80]  General appearance: alert, cooperative and no distress Heart: regular rate and rhythm Lungs: clear to auscultation bilaterally Abdomen: soft, non-tender; bowel sounds normal; no masses,  no organomegaly Extremities: extremities normal, atraumatic, no cyanosis or edema Wound: clean and dry  Lab Results:  Recent Labs  09/03/16 0413 09/04/16 0427  WBC 9.8 8.5  HGB 9.3* 8.5*  HCT 28.1* 26.1*  PLT 133* 117*   BMET:  Recent Labs  09/03/16 0413 09/04/16 0427  NA 139 139  K 3.4* 3.9  CL 107 109  CO2 24 26  GLUCOSE 135* 105*  BUN 9 10  CREATININE 1.00 1.11  CALCIUM 7.9* 7.7*    PT/INR: No results for input(s): LABPROT, INR in the last 72 hours. ABG    Component Value Date/Time   PHART 7.428 08/31/2016 1119   HCO3 22.7 08/31/2016 1119   TCO2 25 08/11/2016 0854   ACIDBASEDEF 1.1 08/31/2016 1119   O2SAT 97.3 08/31/2016 1119   CBG (last 3)   Recent Labs  09/04/16 1547 09/04/16 2134  09/05/16 0814  GLUCAP 174* 198* 111*    Assessment/Plan: S/P Procedure(s) (LRB): VIDEO ASSISTED THORACOSCOPY (VATS)/RIGHT UPPER LOBE WEDGE RESECTION (Right) RIGHT UPPER LOBECTOMY (Right) LYMPH NODE DISSECTION, RIGHT LUNG (Right)  1. Chest tube- no air leak, 320 cc output yesterday (level currently at 1490)- will leave on water seal today 2. Pulm- CXR free from pneumothorax, continue IS, not on oxygen 3. CV- Hemodynamically stable 4. Pain control- improved after removal of one of his chest tubes yesterday 5. Dispo- patient stable, leave chest tube in place today, can hopefully remove in AM if drainage decreases, continue current care   LOS: 3 days    Ellwood Handler 09/05/2016   Chart reviewed, patient examined, agree with above. He is feeling better every day. Doing 1750 on IS, ambulating well.

## 2016-09-06 ENCOUNTER — Inpatient Hospital Stay (HOSPITAL_COMMUNITY): Payer: Medicare Other

## 2016-09-06 DIAGNOSIS — C3491 Malignant neoplasm of unspecified part of right bronchus or lung: Secondary | ICD-10-CM

## 2016-09-06 HISTORY — DX: Malignant neoplasm of unspecified part of right bronchus or lung: C34.91

## 2016-09-06 LAB — GLUCOSE, CAPILLARY
Glucose-Capillary: 123 mg/dL — ABNORMAL HIGH (ref 65–99)
Glucose-Capillary: 124 mg/dL — ABNORMAL HIGH (ref 65–99)
Glucose-Capillary: 105 mg/dL — ABNORMAL HIGH (ref 65–99)

## 2016-09-06 NOTE — Progress Notes (Signed)
D/c pt Chest Tube. Pre-medicated pt 45 min before chest tube removal. Pt tolerated well. O2 stat at 100%. Will continue to monitor pt.

## 2016-09-06 NOTE — Progress Notes (Addendum)
      [address]       [place] [address]             [phone]      4 Days Post-Op Procedure(s) (LRB): VIDEO ASSISTED THORACOSCOPY (VATS)/RIGHT UPPER LOBE WEDGE RESECTION (Right) RIGHT UPPER LOBECTOMY (Right) LYMPH NODE DISSECTION, RIGHT LUNG (Right)   Subjective:  James [name] has a few concerns this morning.  He is concerned about being short of breath at times with ambulation.  He thought he would be feeling better and further along than he is.  I explained to the patient that he had a big surgery.  He has also had a lung removed.  He has good lung function.  I explained to the patient that this will improve with time.  He should rest when he feels short of winded and try again later.  + BM (small)  Objective: Vital signs in last 24 hours: Temp:  [98.3 F (36.8 C)-99.2 F (37.3 C)] 98.6 F (37 C) (04/29 0818) Pulse Rate:  [61-78] 68 (04/29 0818) Cardiac Rhythm: Normal sinus rhythm (04/28 2330) Resp:  [18-25] 24 (04/29 0818) BP: (139-149)/(65-72) 149/69 (04/29 0818) SpO2:  [98 %-100 %] 99 % (04/29 0818)  Intake/Output from previous day: 04/28 0701 - 04/29 0700 In: 120 [I.V.:120] Out: 1565 [Urine:1375; Chest Tube:190]  General appearance: alert, cooperative and no distress Heart: regular rate and rhythm Lungs: clear to auscultation bilaterally Abdomen: soft, non-tender; bowel sounds normal; no masses,  no organomegaly Extremities: extremities normal, atraumatic, no cyanosis or edema Wound: clean and dry  Lab Results:  Recent Labs  09/04/16 0427  WBC 8.5  HGB 8.5*  HCT 26.1*  PLT 117*   BMET:  Recent Labs  09/04/16 0427  NA 139  K 3.9  CL 109  CO2 26  GLUCOSE 105*  BUN 10  CREATININE 1.11  CALCIUM 7.7*    PT/INR: No results for input(s): LABPROT, INR in the last 72 hours. ABG    Component Value Date/Time   PHART 7.428 08/31/2016 1119   HCO3 22.7 08/31/2016 1119   TCO2 25 08/11/2016 0854   ACIDBASEDEF 1.1 08/31/2016 1119   O2SAT 97.3 08/31/2016 1119   CBG (last 3)   Recent Labs  09/05/16 1644 09/05/16 2141 09/06/16 0815  GLUCAP 107* 126* 123*    Assessment/Plan: S/P Procedure(s) (LRB): VIDEO ASSISTED THORACOSCOPY (VATS)/RIGHT UPPER LOBE WEDGE RESECTION (Right) RIGHT UPPER LOBECTOMY (Right) LYMPH NODE DISSECTION, RIGHT LUNG (Right)  1. Chest tube- no air leak, 180 cc output yesterday- will d/c chest tube today.. Repeat CXR in AM 2. Pulm- no acute issues, continue IS 3. CV- hemodynamically stable, mild HTN- will need to follow up with PCP 4. Dispo- patient stable, will d/c chest tube, d/c central line, d/c PCA.Marland Kitchen Home in AM if CXR is okay   LOS: 4 days    James [name] 09/06/2016    Chart reviewed, patient examined, agree with above. CXR today is clear. Remove tube and possibly home in am.

## 2016-09-06 NOTE — Progress Notes (Signed)
D/c pt PCA pump. Wasted pt Fentanyl 13 mcg with Suzzanne Cloud, RN. Will continue to monitor pt.

## 2016-09-07 ENCOUNTER — Inpatient Hospital Stay (HOSPITAL_COMMUNITY): Payer: Medicare Other

## 2016-09-07 LAB — GLUCOSE, CAPILLARY: Glucose-Capillary: 110 mg/dL — ABNORMAL HIGH (ref 65–99)

## 2016-09-07 MED ORDER — TRAMADOL HCL 50 MG PO TABS: ORAL_TABLET | ORAL | 0 refills | Status: DC

## 2016-09-07 MED ORDER — OXYCODONE HCL 5 MG PO TABS: ORAL_TABLET | ORAL | 0 refills | Status: DC

## 2016-09-07 NOTE — Discharge Instructions (Signed)
Thoracotomy, Care After This sheet gives you information about how to care for yourself after your procedure. Your health care provider may also give you more specific instructions. If you have problems or questions, contact your health care provider. What can I expect after the procedure? After your procedure, it is common to have:  Pain and swelling around the incision area.  Pain when you breathe in (inhale).  Constipation.  Fatigue.  Loss of appetite.  Trouble sleeping.  Mood swings and depression. Follow these instructions at home: Preventing pneumonia   Take deep breaths or do breathing exercises as instructed by your health care provider.  Cough frequently. Coughing may cause discomfort, but it is important to clear mucus (phlegm) and expand your lungs. If coughing hurts, hold a pillow against your chest or place both hands flat on top of the incision (splinting) when you cough. This may help relieve discomfort.  Continue to use an incentive spirometer as directed. This is a tool that measures how well you fill your lungs with each breath.  Participate in pulmonary rehabilitation as directed. This is a program that combines education, exercise, and support from a team of specialists. The goal is to help you heal and return to normal activities as soon as possible. Medicines   Take over-the-counter or prescription medicines only as told by your health care provider.  If you have pain, take pain-relieving medicine before your pain becomes severe. This is important because if your pain is under control, you will be able to breathe and cough more comfortably.  If you were prescribed an antibiotic medicine, take it as told by your health care provider. Do not stop taking the antibiotic even if you start to feel better. Activity   Ask your health care provider what activities are safe for you.  Do not travel by airplane for 2 weeks after your chest tube is removed, or until  your health care provider says that this is safe.  Do not lift anything that is heavier than 10 lb (4.5 kg), or the limit that your health care provider tells you, until he or she says that it is safe.  Do not drive until your health care provider approves.  Do not drive or use heavy machinery while taking prescription pain medicine. Incision care   Follow instructions from your health care provider about how to take care of your incision. Make sure you:  Wash your hands with soap and water before you change your bandage (dressing). If soap and water are not available, use hand sanitizer.  Change your dressing as told by your health care provider.  Leave stitches (sutures), skin glue, or adhesive strips in place. These skin closures may need to stay in place for 2 weeks or longer. If adhesive strip edges start to loosen and curl up, you may trim the loose edges. Do not remove adhesive strips completely unless your health care provider tells you to do that.  Keep your dressing dry.  Check your incision area every day for signs of infection. Check for:  More redness, swelling, or pain.  More fluid or blood.  Warmth.  Pus or a bad smell. Bathing   Do not take baths, swim, or use a hot tub until your health care provider approves. You may take showers.  After your dressing has been removed, use soap and water to gently wash your incision area. Do not use anything else to clean your incision unless your health care provider tells you to  do that. Eating and drinking   Eat a healthy diet as instructed by your health care provider. A healthy diet includes plenty of fresh fruits and vegetables, whole grains, and low-fat (lean) proteins.  Drink enough fluid to keep your urine clear or pale yellow. General instructions   To prevent or treat constipation while you are taking prescription pain medicine, your health care provider may recommend that you:  Take over-the-counter or  prescription medicines.  Eat foods that are high in fiber, such as fresh fruits and vegetables, whole grains, and beans.  Limit foods that are high in fat and processed sugars, such as fried and sweet foods.  Do not use any products that contain nicotine or tobacco, such as cigarettes and e-cigarettes. If you need help quitting, ask your health care provider.  Avoid secondhand smoke.  Wear compression stockings as told by your health care provider. These stockings help to prevent blood clots and reduce swelling in your legs.  If you have a chest tube, care for it as instructed.  Keep all follow-up visits as told by your health care provider. This is important. Contact a health care provider if:  You have more redness, swelling, or pain around your incision.  You have more fluid or blood coming from your incision.  Your incision feels warm to the touch.  You have pus or a bad smell coming from your incision.  You have a fever or chills.  Your heartbeat seems irregular.  You have nausea or vomiting.  You have muscle aches.  You are constipated. This may mean that you have:  Fewer bowel movements in a week than normal.  Difficulty having a bowel movement.  Stools that are dry, hard, or larger than normal. Get help right away if:  You develop a rash.  You feel light-headed or feel like you are going to faint.  You have shortness of breath or trouble breathing.  You are confused.  You have trouble speaking.  You have vision problems.  You are not able to move.  You have numbness in your face, arms, or legs.  You lose consciousness.  You have a sudden, severe headache.  You feel weak.  You have chest pain.  You have pain that:  Is severe.  Gets worse, even with medicine. Summary  To prevent pneumonia, take deep breaths, do breathing exercises, and cough frequently, as instructed by your health care provider.  Do not drive until your health care  provider approves. Do not travel by airplane for 2 weeks after your chest tube is removed, or until your health care provider approves.  Check your incision area every day for signs of infection.  Eat a healthy diet that includes plenty of fresh fruits and vegetables, whole grains, and low-fat (lean) proteins. This information is not intended to replace advice given to you by your health care provider. Make sure you discuss any questions you have with your health care provider. Document Released: 10/10/2010 Document Revised: 01/20/2016 Document Reviewed: 01/20/2016 Elsevier Interactive Patient Education  2017 [employer].       Please wear your CPAP every night.

## 2016-09-07 NOTE — Care Management Note (Signed)
Case Management Note  Patient Details  Name: James [name] MRN: [id number] Date of Birth: November 27, 1950  Subjective/Objective:    POD 5 VATS,   Wedge resection, lobectomy,and lymph node dissection, for dc today. No needs.               Action/Plan:   Expected Discharge Date:  09/07/16               Expected Discharge Plan:  Home/Self Care  In-House Referral:     Discharge planning Services  CM Consult  Post Acute Care Choice:    Choice offered to:     DME Arranged:    DME Agency:     HH Arranged:    HH Agency:     Status of Service:  Completed, signed off  If discussed at [employer] of Stay Meetings, dates discussed:    Additional Comments:  Zenon Mayo, RN 09/07/2016, 3:12 PM

## 2016-09-07 NOTE — Care Management Important Message (Signed)
Important Message  Patient Details  Name: James [name] MRN: [id number] Date of Birth: August 07, 1950   Medicare Important Message Given:  Yes    Nathen May 09/07/2016, 11:39 PM

## 2016-09-07 NOTE — Progress Notes (Addendum)
      [address]       [place] [address]             [phone]       5 Days Post-Op Procedure(s) (LRB): VIDEO ASSISTED THORACOSCOPY (VATS)/RIGHT UPPER LOBE WEDGE RESECTION (Right) RIGHT UPPER LOBECTOMY (Right) LYMPH NODE DISSECTION, RIGHT LUNG (Right)  Subjective: Patient states breathing is slowly getting better.  Objective: Vital signs in last 24 hours: Temp:  [98.2 F (36.8 C)-98.6 F (37 C)] 98.4 F (36.9 C) (04/30 0357) Pulse Rate:  [64-70] 66 (04/30 0357) Cardiac Rhythm: Normal sinus rhythm (04/30 0700) Resp:  [16-24] 18 (04/30 0357) BP: (119-150)/(63-76) 119/69 (04/30 0357) SpO2:  [96 %-100 %] 100 % (04/30 0357)      Intake/Output from previous day: 04/29 0701 - 04/30 0700 In: 840 [P.O.:840] Out: 100 [Chest Tube:100]   Physical Exam:  Cardiovascular: RRR Pulmonary: Clear to auscultation on the left and diminished right apex Abdomen: Soft, non tender, bowel sounds present. Extremities: No lower extremity edema. Wounds: Clean and dry.  No erythema or signs of infection.   Lab Results: CBC:No results for input(s): WBC, HGB, HCT, PLT in the last 72 hours. BMET: No results for input(s): NA, K, CL, CO2, GLUCOSE, BUN, CREATININE, CALCIUM in the last 72 hours.  PT/INR: No results for input(s): LABPROT, INR in the last 72 hours. ABG:  INR: Will add last result for INR, ABG once components are confirmed Will add last 4 CBG results once components are confirmed  Assessment/Plan:  1. CV - SR in the 60's with occasional SB in the 50's. 2.  Pulmonary - On room air. CXR this am shows a right apical pneumothorax. Encourage incentive spirometer. 3. DM-CBGs 123/124/105.  4. Will order CXR for am and if remains stable, likely discharge in am  ZIMMERMAN,DONIELLE MPA-C 09/07/2016,7:15 AM Patient seen and examined, agree with above Asymptomatic from small pneumothorax/ space Will see how he does with ambulation- if no problems will dc later  today  Remo Lipps C. [name], MD [facility] [phone]

## 2016-09-08 ENCOUNTER — Institutional Professional Consult (permissible substitution): Payer: Medicare Other | Admitting: Internal Medicine

## 2016-09-09 ENCOUNTER — Telehealth (HOSPITAL_COMMUNITY): Payer: Self-pay | Admitting: Physician Assistant

## 2016-09-09 LAB — TYPE AND SCREEN
ABO/RH(D): O POS
Antibody Screen: NEGATIVE
Unit division: 0
Unit division: 0
Unit division: 0
Unit division: 0

## 2016-09-09 LAB — BPAM RBC
Blood Product Expiration Date: 201805182359
Blood Product Expiration Date: 201805182359
Blood Product Expiration Date: 201805182359
Blood Product Expiration Date: 201805182359
ISSUE DATE / TIME: 201804300244
ISSUE DATE / TIME: 201805011228
Unit Type and Rh: 5100
Unit Type and Rh: 5100
Unit Type and Rh: 5100
Unit Type and Rh: 5100

## 2016-09-09 NOTE — Telephone Encounter (Signed)
      [address]       [place] [address]             [phone]    [name] [phone]   S/P right VATS, mini thoracotomy, RUL performed by Dr. [name] on 04/125/2018.  He was discharged home on 09/07/2016.  Medications: Has only take Ultram two times for pain.    Problems/Concerns:  Assessment:  Patient is continuing to recover from right lung surgery. He states he is walking several times daily in the house and using his incentive spirometer. He states he did get "winded" going up the stairs fast earlier. He does not think his breathing has worsened since discharge.  I discussed with him that total recovery from the surgery will likely take several weeks. He should continue to improve with time, but with removal of his right upper lobe, he may notice a breathing difference initially after surgery but hopefully this will improve with time. He was instructed to contact office if concerns or problems develop (I.e. Increasing shortness of breath, fever, wound drainage).  Follow up Appointment: 09/22/2016 at 12:45 (PA/LAT CXR to be taken prior to this appointment)

## 2016-09-15 ENCOUNTER — Other Ambulatory Visit: Payer: Self-pay | Admitting: Thoracic Surgery (Cardiothoracic Vascular Surgery)

## 2016-09-15 DIAGNOSIS — C3490 Malignant neoplasm of unspecified part of unspecified bronchus or lung: Secondary | ICD-10-CM

## 2016-09-17 ENCOUNTER — Encounter: Payer: Self-pay | Admitting: Thoracic Surgery (Cardiothoracic Vascular Surgery)

## 2016-09-17 ENCOUNTER — Other Ambulatory Visit: Payer: Self-pay | Admitting: *Deleted

## 2016-09-17 ENCOUNTER — Ambulatory Visit (INDEPENDENT_AMBULATORY_CARE_PROVIDER_SITE_OTHER): Payer: Self-pay | Admitting: Thoracic Surgery (Cardiothoracic Vascular Surgery)

## 2016-09-17 ENCOUNTER — Ambulatory Visit
Admission: RE | Admit: 2016-09-17 | Discharge: 2016-09-17 | Disposition: A | Discharge status: Home / Self Care | Payer: Medicare Other | Source: Ambulatory Visit | Attending: Thoracic Surgery (Cardiothoracic Vascular Surgery) | Admitting: Thoracic Surgery (Cardiothoracic Vascular Surgery)

## 2016-09-17 ENCOUNTER — Other Ambulatory Visit: Payer: Self-pay

## 2016-09-17 VITALS — BP 140/80 | HR 90 | Resp 20 | Ht 69.0 in | Wt 219.0 lb

## 2016-09-17 DIAGNOSIS — C3490 Malignant neoplasm of unspecified part of unspecified bronchus or lung: Secondary | ICD-10-CM

## 2016-09-17 DIAGNOSIS — Z902 Acquired absence of lung [part of]: Secondary | ICD-10-CM

## 2016-09-17 DIAGNOSIS — R0602 Shortness of breath: Secondary | ICD-10-CM

## 2016-09-17 DIAGNOSIS — R9389 Abnormal findings on diagnostic imaging of other specified body structures: Secondary | ICD-10-CM

## 2016-09-17 MED ORDER — DOXYCYCLINE HYCLATE 100 MG PO TABS: 100.0000 mg | ORAL_TABLET | Freq: Two times a day (BID) | ORAL | 0 refills | Status: DC

## 2016-09-17 MED ORDER — CIPROFLOXACIN HCL 500 MG PO TABS: 500.0000 mg | ORAL_TABLET | Freq: Two times a day (BID) | ORAL | 0 refills | Status: DC

## 2016-09-17 MED ORDER — FUROSEMIDE 40 MG PO TABS: 40.0000 mg | ORAL_TABLET | Freq: Every day | ORAL | 0 refills | Status: DC

## 2016-09-17 NOTE — Progress Notes (Signed)
[address]       ,[name] [id number]             [phone]    HPI: James [name] returns for an unscheduled postop visit  He is a 66 year old man with recently discovered severe mitral regurgitation and a long-standing lung nodule. The lung nodule had increased in size and was hypermetabolic on PET/CT. It turned out to be an adenocarcinoma. He had a right upper lobectomy on 09/02/2016. His postoperative course was unremarkable and he went home on 09/07/2016.  He was doing very well and making good progress until Tuesday when he developed a nonproductive cough. He hasn't had any fevers or chills. Yesterday he said he had a very good day and did a lot of walking. Today he complains of feeling more short of breath and having a gurgling sensation in his chest when he bent over and stood back up.  Past Medical History:  Diagnosis Date  . Anxiety   . Arthritis    "knees, hips, back" (10/19/2012)  . Chronic lower back pain   . Colon polyps    10/27/2002, repeat letter 09/17/2007  . Coronary artery disease   . Depressive disorder, not elsewhere classified   . Diabetes mellitus without complication (HCC)    diet controlled  . Fasting hyperglycemia   . GERD (gastroesophageal reflux disease)   . Hemoptysis    abnormal CT Chest 01/29/10 - ? new GG changes RUL > not viz on plain cxr 02/26/2010  . Mitral regurgitation    severe MR 08/2016  . MPN (myeloproliferative neoplasm) ([place])    1st detected 06/04/1998  . Obesity   . OSA on CPAP    last sleep study 10 years ago  . Other and unspecified hyperlipidemia   . Positive PPD 1965   "non reactive in 2012" (10/19/2012)  . Routine general medical examination at a health care facility   . Special screening for malignant neoplasm of prostate   . Spinal stenosis, unspecified region other than cervical   . Wrist pain, left      Current Outpatient Prescriptions  Medication Sig Dispense Refill  . aspirin EC 81 MG tablet Take 1  tablet (81 mg total) by mouth daily. 90 tablet 3  . atorvastatin (LIPITOR) 20 MG tablet Take 1 tablet (20 mg total) by mouth daily. 90 tablet 3  . FLAXSEED, LINSEED, PO Take 20 mLs by mouth daily. Sprinkled in drink daily    . Multiple Vitamin (MULTIVITAMIN) tablet Take 1 tablet by mouth daily.    . naproxen sodium (ANAPROX) 220 MG tablet Take 220 mg by mouth 2 (two) times daily as needed (pain).    . Omega-3 Fatty Acids (FISH OIL) 500 MG CAPS Take 500 mg by mouth daily.     . psyllium (METAMUCIL) 58.6 % packet Take 1 packet by mouth daily.    . sodium chloride (OCEAN) 0.65 % SOLN nasal spray Place 2 sprays into both nostrils at bedtime.    Bethann Humble Sulfate (ALLERGY RELIEF EYE DROPS OP) Apply 2 drops to eye daily as needed (allergy).    . traMADol (ULTRAM) 50 MG tablet Take 50 mg by mouth every 4-6 hours PRN severe pain. 30 tablet 0  . Undecylenic Ac-Zn Undecylenate (FUNGI-NAIL TOE & FOOT EX) Apply 1 application topically daily.    . ciprofloxacin (CIPRO) 500 MG tablet Take 1 tablet (500 mg total) by mouth 2 (two) times daily. 14 tablet 0  . furosemide (  LASIX) 40 MG tablet Take 1 tablet (40 mg total) by mouth daily. 14 tablet 0   No current facility-administered medications for this visit.     Physical Exam BP 140/80   Pulse 90   Resp 20   Ht '5\' 9"'$  (1.753 m)   Wt 219 lb (99.3 kg)   SpO2 94%   BMI 32.33 kg/m  66 year old man in no acute distress Relatively breathless with talking Alert and oriented 3 with no focal deficits Lungs absent breath sounds right base, faint crackles Incision clean dry and intact 1+ edema both lower extremities  Diagnostic Tests: CHEST  2 VIEW  COMPARISON:  Chest x-ray of September 07, 2016  FINDINGS: There is a persistent right-sided pneumothorax which is a decrease in size since the previous study and amounts to approximately 10% of the lung volume. The pleural line lies between the posterior third and fourth ribs. There remains pleural  fluid at the right lung base. There is no mediastinal shift. There is mild right perihilar density slightly more conspicuous today. The left lung is clear. The heart and pulmonary vascularity are normal. There is calcification in the wall of the aortic arch. The bony thorax exhibits no acute abnormality.  IMPRESSION: Persistent approximately 10% right apical pneumothorax. This is decreased in volume since the previous study. Slight interval increase in haziness in the right perihilar region may reflect worsening atelectasis or early pneumonia. Persistent moderate size right pleural effusion.  Thoracic aortic atherosclerosis.  These results will be called to the ordering clinician or representative by the Radiologist Assistant, and communication documented in the PACS or zVision Dashboard.   Electronically Signed   By: David  Martinique M.D.   On: 09/17/2016 14:54 I personally reviewed the chest x-ray confirmed the findings noted above. The apical space is smaller than previously  Impression: James [name] is a 66 year old gentleman who is about 2 weeks out from surgery. Today he noticed worsening shortness of breath. He has had some degree shortness of breath since his surgery, which is to be expected. He felt much better yesterday, but today does not feel as well.  On exam he has some mild peripheral edema and he does have severe mitral regurgitation. I'm going to start him on Lasix 40 mg a day for a week. He also has some haziness in the right perihilar region. He could have an early pneumonia. He does have a cough although he doesn't have fevers and chills. I am going to put him on Cipro 500 mg by mouth twice a day for week as well.  Plan: Lasix 40 mg daily Cipro 500 mg twice a day I will see him back in the office next Tuesday at his scheduled appointment He knows to call if he has worsening shortness of breath  Melrose Nakayama, MD [facility] [phone]

## 2016-09-22 ENCOUNTER — Ambulatory Visit
Admission: RE | Admit: 2016-09-22 | Discharge: 2016-09-22 | Disposition: A | Discharge status: Home / Self Care | Payer: Medicare Other | Source: Ambulatory Visit | Attending: Thoracic Surgery (Cardiothoracic Vascular Surgery) | Admitting: Thoracic Surgery (Cardiothoracic Vascular Surgery)

## 2016-09-22 ENCOUNTER — Ambulatory Visit (INDEPENDENT_AMBULATORY_CARE_PROVIDER_SITE_OTHER): Payer: Self-pay | Admitting: Thoracic Surgery (Cardiothoracic Vascular Surgery)

## 2016-09-22 ENCOUNTER — Encounter: Payer: Self-pay | Admitting: Thoracic Surgery (Cardiothoracic Vascular Surgery)

## 2016-09-22 VITALS — BP 148/86 | HR 85 | Resp 20 | Ht 69.0 in | Wt 219.0 lb

## 2016-09-22 DIAGNOSIS — Z902 Acquired absence of lung [part of]: Secondary | ICD-10-CM

## 2016-09-22 DIAGNOSIS — C3490 Malignant neoplasm of unspecified part of unspecified bronchus or lung: Secondary | ICD-10-CM

## 2016-09-22 NOTE — Progress Notes (Signed)
[address]       [place] [address]             [phone]    HPI: Mr. Ander returns for a scheduled postoperative follow-up visit  He is a 66 year old male was recently found to have severe mitral regurgitation. He also has a long-standing lung nodule. The lung nodule had increased in size and was hypermetabolic on PET/CT. The right upper lobectomy on 09/02/2016. It was an adenocarcinoma. 15 nodes were negative. His initial postoperative course was unremarkable and he went home on 09/07/2016.  He was doing well until last week when he developed a nonproductive cough. I saw him in the office on 09/17/2016 complaints of feeling more short of breath and a gurgling sensation in his chest. His chest x-ray was unremarkable. I put him on Lasix 40 mg a day and also gave him a course of Cipro 500 mg twice a day. He says that he has had a lot of response to the Lasix over the past few days. He still taking tramadol at night before he goes to bed.  Past Medical History:  Diagnosis Date  . Anxiety   . Arthritis    "knees, hips, back" (10/19/2012)  . Chronic lower back pain   . Colon polyps    10/27/2002, repeat letter 09/17/2007  . Coronary artery disease   . Depressive disorder, not elsewhere classified   . Diabetes mellitus without complication (HCC)    diet controlled  . Fasting hyperglycemia   . GERD (gastroesophageal reflux disease)   . Hemoptysis    abnormal CT Chest 01/29/10 - ? new GG changes RUL > not viz on plain cxr 02/26/2010  . Mitral regurgitation    severe MR 08/2016  . MPN (myeloproliferative neoplasm) ([place])    1st detected 06/04/1998  . Obesity   . OSA on CPAP    last sleep study 10 years ago  . Other and unspecified hyperlipidemia   . Positive PPD 1965   "non reactive in 2012" (10/19/2012)  . Routine general medical examination at a health care facility   . Special screening for malignant neoplasm of prostate   . Spinal stenosis, unspecified region  other than cervical   . Wrist pain, left       Current Outpatient Prescriptions  Medication Sig Dispense Refill  . aspirin EC 81 MG tablet Take 1 tablet (81 mg total) by mouth daily. 90 tablet 3  . atorvastatin (LIPITOR) 20 MG tablet Take 1 tablet (20 mg total) by mouth daily. 90 tablet 3  . doxycycline (VIBRA-TABS) 100 MG tablet Take 1 tablet (100 mg total) by mouth 2 (two) times daily. 14 tablet 0  . FLAXSEED, LINSEED, PO Take 20 mLs by mouth daily. Sprinkled in drink daily    . furosemide (LASIX) 40 MG tablet Take 1 tablet (40 mg total) by mouth daily. 14 tablet 0  . Multiple Vitamin (MULTIVITAMIN) tablet Take 1 tablet by mouth daily.    . naproxen sodium (ANAPROX) 220 MG tablet Take 220 mg by mouth 2 (two) times daily as needed (pain).    . Omega-3 Fatty Acids (FISH OIL) 500 MG CAPS Take 500 mg by mouth daily.     . psyllium (METAMUCIL) 58.6 % packet Take 1 packet by mouth daily.    . sodium chloride (OCEAN) 0.65 % SOLN nasal spray Place 2 sprays into both nostrils at bedtime.    Bethann Humble Sulfate (ALLERGY RELIEF EYE DROPS OP)  Apply 2 drops to eye daily as needed (allergy).    . traMADol (ULTRAM) 50 MG tablet Take 50 mg by mouth every 4-6 hours PRN severe pain. 30 tablet 0  . Undecylenic Ac-Zn Undecylenate (FUNGI-NAIL TOE & FOOT EX) Apply 1 application topically daily.     No current facility-administered medications for this visit.     Physical Exam BP (!) 148/86 (BP Location: Right Arm, Cuff Size: Normal)   Pulse 85   Resp 20   Ht '5\' 9"'$  (1.753 m)   Wt 219 lb (99.3 kg)   SpO2 97% Comment: RA  BMI 32.53 kg/m  66 year old man in no acute distress Alert and oriented 3 with no focal deficits Lungs diminished at right base, otherwise clear Cardiac regular rate and rhythm with a systolic murmur Incision clean dry and intact, chest tube sites healing well No peripheral edema  Diagnostic Tests: CHEST  2 VIEW  COMPARISON:  09/17/2016.  FINDINGS: Slight  decrease in size right apical pneumothorax now less than 10% (crossing the posterior right third rib previously crossing below the posterior right third rib).  Postsurgical changes right lung with elevation right hemidiaphragm.  Central pulmonary vascular prominence without pulmonary edema.  Bilateral acromioclavicular joint degenerative changes. Thoracic spine degenerative changes. No acute osseous abnormality.  Heart size within normal limits.  IMPRESSION: Interval slight decrease in size right pneumothorax now less than 10%. Remainder of findings without change.   Electronically Signed   By: Genia Del M.D.   On: 09/22/2016 12:25 I personally reviewed the chest x-ray and concur with the findings noted above. He has a small residual space.  Impression: Mr. Sally is a 66 year old gentleman who is now about 2 weeks out from a thoracoscopic right upper lobectomy. We did have to convert to a mini thoracotomy due to bleeding. He looks better today than when I saw him 5 days ago, but still gets winded relatively easily. Marland Kitchen  His cough has improved with ciprofloxacin and he should complete his course of that.  His peripheral edema has improved with Lasix and is below his preop weight. He will hold his Lasix for now. If he notices swelling or increasing shortness of breath he will start taking that again and let me know.  He was scheduled to fly to [facility] in 3 weeks. I don't think he is going to be ready to do that.  I encouraged him to continue to gradually increase his physical activity as tolerated.  He should not drive until his pain improves to the point that he can use his arms and turn without discomfort.  I'm going to refer him to pulmonary rehabilitation.  We will refer him to our multidisciplinary thoracic oncology clinic to see Dr. Julien Nordmann. He should not need chemotherapy as all of his nodes were negative, but he will need continued follow-up.  Allergies at least  a couple months away from being able to consider mitral valve repair.  Plan: [place] for oncology evaluation Hold Lasix Complete Cipro Return in one month with chest x-ray to check on progress.  Melrose Nakayama, MD [facility] [phone]

## 2016-09-24 ENCOUNTER — Telehealth: Payer: Self-pay | Admitting: *Deleted

## 2016-09-24 ENCOUNTER — Encounter: Payer: Self-pay | Admitting: *Deleted

## 2016-09-24 DIAGNOSIS — C3491 Malignant neoplasm of unspecified part of right bronchus or lung: Secondary | ICD-10-CM

## 2016-09-24 NOTE — Telephone Encounter (Signed)
Oncology Nurse Navigator Documentation  Oncology Nurse Navigator Flowsheets 09/24/2016  Navigator Location [facility]  Referral date to RadOnc/MedOnc 09/22/2016  Navigator Encounter Type Telephone/I received referral on Mr. Granzow.  I called patient and updated him on appt for [facility] on 10/01/16.  He verbalized understanding of appt time and place.   Telephone Outgoing Call  Treatment Phase Other  Barriers/Navigation Needs Coordination of Care  Interventions Coordination of Care  Coordination of Care Appts  Acuity Level 2  Acuity Level 2 Assistance expediting appointments  Time Spent with Patient 30

## 2016-09-25 ENCOUNTER — Encounter: Payer: Self-pay | Admitting: *Deleted

## 2016-09-25 NOTE — Progress Notes (Signed)
Oncology Nurse Navigator Documentation  Oncology Nurse Navigator Flowsheets 09/25/2016  Navigator Location [facility]  Navigator Encounter Type Letter/Fax/Email/mailed [facility] letter   Treatment Phase Other  Barriers/Navigation Needs Education  Education Other  Interventions Education  Education Method Written  Acuity Level 2  Time Spent with Patient 15

## 2016-10-01 ENCOUNTER — Telehealth: Payer: Self-pay | Admitting: Internal Medicine

## 2016-10-01 ENCOUNTER — Encounter: Payer: Self-pay | Admitting: Internal Medicine

## 2016-10-01 ENCOUNTER — Encounter: Payer: Self-pay | Admitting: *Deleted

## 2016-10-01 ENCOUNTER — Ambulatory Visit: Payer: Medicare Other | Attending: Internal Medicine | Admitting: Physical Therapy

## 2016-10-01 ENCOUNTER — Ambulatory Visit (HOSPITAL_BASED_OUTPATIENT_CLINIC_OR_DEPARTMENT_OTHER): Payer: Medicare Other | Admitting: Internal Medicine

## 2016-10-01 ENCOUNTER — Other Ambulatory Visit (HOSPITAL_BASED_OUTPATIENT_CLINIC_OR_DEPARTMENT_OTHER): Payer: Medicare Other

## 2016-10-01 VITALS — BP 148/81 | HR 87 | Temp 98.6°F | Resp 18 | Ht 69.0 in | Wt 223.4 lb

## 2016-10-01 DIAGNOSIS — Z483 Aftercare following surgery for neoplasm: Secondary | ICD-10-CM

## 2016-10-01 DIAGNOSIS — C3411 Malignant neoplasm of upper lobe, right bronchus or lung: Secondary | ICD-10-CM

## 2016-10-01 DIAGNOSIS — R079 Chest pain, unspecified: Secondary | ICD-10-CM | POA: Diagnosis not present

## 2016-10-01 DIAGNOSIS — C3491 Malignant neoplasm of unspecified part of right bronchus or lung: Secondary | ICD-10-CM

## 2016-10-01 DIAGNOSIS — E119 Type 2 diabetes mellitus without complications: Secondary | ICD-10-CM | POA: Diagnosis not present

## 2016-10-01 DIAGNOSIS — I340 Nonrheumatic mitral (valve) insufficiency: Secondary | ICD-10-CM | POA: Diagnosis not present

## 2016-10-01 DIAGNOSIS — R293 Abnormal posture: Secondary | ICD-10-CM | POA: Diagnosis present

## 2016-10-01 DIAGNOSIS — R262 Difficulty in walking, not elsewhere classified: Secondary | ICD-10-CM | POA: Diagnosis present

## 2016-10-01 LAB — COMPREHENSIVE METABOLIC PANEL
ALT: 24 U/L (ref 0–55)
AST: 20 U/L (ref 5–34)
Albumin: 4 g/dL (ref 3.5–5.0)
Alkaline Phosphatase: 65 U/L (ref 40–150)
Anion Gap: 6 mEq/L (ref 3–11)
BUN: 15.9 mg/dL (ref 7.0–26.0)
CO2: 28 mEq/L (ref 22–29)
Calcium: 9.6 mg/dL (ref 8.4–10.4)
Chloride: 108 mEq/L (ref 98–109)
Creatinine: 1.5 mg/dL — ABNORMAL HIGH (ref 0.7–1.3)
EGFR: 49 mL/min/{1.73_m2} — ABNORMAL LOW (ref >90)
Glucose: 128 mg/dl (ref 70–140)
Potassium: 4.2 mEq/L (ref 3.5–5.1)
Sodium: 142 mEq/L (ref 136–145)
Total Bilirubin: 0.46 mg/dL (ref 0.20–1.20)
Total Protein: 6.8 g/dL (ref 6.4–8.3)

## 2016-10-01 LAB — CBC WITH DIFFERENTIAL/PLATELET
BASO%: 0.5 % (ref 0.0–2.0)
Basophils Absolute: 0 10*3/uL (ref 0.0–0.1)
EOS%: 4.1 % (ref 0.0–7.0)
Eosinophils Absolute: 0.3 10*3/uL (ref 0.0–0.5)
HCT: 42.6 % (ref 38.4–49.9)
HGB: 14 g/dL (ref 13.0–17.1)
LYMPH%: 23.2 % (ref 14.0–49.0)
MCH: 30.1 pg (ref 27.2–33.4)
MCHC: 32.9 g/dL (ref 32.0–36.0)
MCV: 91.6 fL (ref 79.3–98.0)
MONO#: 0.6 10*3/uL (ref 0.1–0.9)
MONO%: 9 % (ref 0.0–14.0)
NEUT#: 4.2 10*3/uL (ref 1.5–6.5)
NEUT%: 63.2 % (ref 39.0–75.0)
Platelets: 191 10*3/uL (ref 140–400)
RBC: 4.65 10*6/uL (ref 4.20–5.82)
RDW: 14.2 % (ref 11.0–14.6)
WBC: 6.6 10*3/uL (ref 4.0–10.3)
lymph#: 1.5 10*3/uL (ref 0.9–3.3)

## 2016-10-01 NOTE — Progress Notes (Signed)
[place] Telephone:([phone]) [phone]   [phone]) [phone] Multidisciplinary thoracic oncology clinic  CONSULT NOTE  REFERRING PHYSICIAN: Dr. Modesto Charon  REASON FOR CONSULTATION:  66 years old white male recently diagnosed with lung cancer.  HPI James [name] is a 66 y.o. male was past medical history significant for anxiety, osteoarthritis, chronic back pain, history of coronary heart disease, diabetes mellitus, obstructive sleep apnea, GERD as well as mitral regurgitation and despite a stenosis. The patient mentioned that since 2010 he has a known nodule and the lung that was followed for several years. I repeat CT scan of the chest on 08/05/2016 showed an irregular solid 2.5 x 1.8 cm anterior right upper lobe pulmonary nodule with spiculation extending to the anterior and mediastinal pleural service increased in size and density from 09/12/2012. There was also a subpleural 4 mm posterior right upper lobe pulmonary nodule and peripheral right lower lobe 5 mm pulmonary nodule that is stable. The patient was referred to [name] and a PET scan on 08/17/2016 showed hypermetabolic solid 2.6 cm anterior right upper lobe pulmonary nodule compatible with prior bronchogenic carcinoma. There was no hypermetabolic thoracic adenopathy or distant metastatic disease. MRI of the brain on 09/01/2016 showed no findings of intracranial metastasis. The patient was referred to Dr. [name]. On 09/02/2016 he underwent a right VATS with right upper lobectomy and mediastinal lymph node dissection under the care of Dr. [name]. The final pathology [id number]) showed moderately differentiated adenocarcinoma measuring 2.6 cm and the final resection margin was negative for tumor. There was no evidence for visceral pleural invasion or lymphovascular invasion identified. Dr. [name] kindly referred the patient to the multidisciplinary thoracic oncology clinic today for further  evaluation and recommendation regarding his condition. When seen today the patient continues to have mild pain in the right side of the chest at the surgical scar as well as shortness breath with exertion and mild cough but no hemoptysis. He lost around 15 pounds during this period but he started gaining weight. He denied having any nausea, vomiting, diarrhea or constipation. He denied having any headache or visual changes. Family history significant for mother died at age 73 with atherosclerosis, father died from heart disease, sister had breast cancer and brother had pulmonary embolism. The patient is married and has 3 children. He was accompanied today by his wife James [name]. He used to work as a Art therapist. He has no history of smoking and drinks alcohol occasionally and no history of drug abuse.  HPI  Past Medical History:  Diagnosis Date  . Anxiety   . Arthritis    "knees, hips, back" (10/19/2012)  . Chronic lower back pain   . Colon polyps    10/27/2002, repeat letter 09/17/2007  . Coronary artery disease   . Depressive disorder, not elsewhere classified   . Diabetes mellitus without complication (HCC)    diet controlled  . Fasting hyperglycemia   . GERD (gastroesophageal reflux disease)   . Hemoptysis    abnormal CT Chest 01/29/10 - ? new GG changes RUL > not viz on plain cxr 02/26/2010  . Mitral regurgitation    severe MR 08/2016  . MPN (myeloproliferative neoplasm) ([place])    1st detected 06/04/1998  . Obesity   . OSA on CPAP    last sleep study 10 years ago  . Other and unspecified hyperlipidemia   . Positive PPD 1965   "non reactive in 2012" (10/19/2012)  . Routine general medical examination at a health  care facility   . Special screening for malignant neoplasm of prostate   . Spinal stenosis, unspecified region other than cervical   . Wrist pain, left     Past Surgical History:  Procedure Laterality Date  . ANTERIOR CRUCIATE LIGAMENT REPAIR Left 1967  . CARDIAC  CATHETERIZATION  2000  . CHEST TUBE INSERTION Right 10/19/2012   post bronch  . COLONOSCOPY W/ POLYPECTOMY    . FLEXIBLE BRONCHOSCOPY  10/19/2012   Flexible video fiberoptic bronchoscopy with electromagnetic navigation and biopsies.  . INTRAVASCULAR PRESSURE WIRE/FFR STUDY N/A 08/11/2016   Procedure: Intravascular Pressure Wire/FFR Study;  Surgeon: [name], MD;  Location: [facility];  Service: Cardiovascular;  Laterality: N/A;  . LOBECTOMY Right 09/02/2016   Procedure: RIGHT UPPER LOBECTOMY;  Surgeon: Melrose Nakayama, MD;  Location: [facility];  Service: Thoracic;  Laterality: Right;  . LYMPH NODE DISSECTION Right 09/02/2016   Procedure: LYMPH NODE DISSECTION, RIGHT LUNG;  Surgeon: Melrose Nakayama, MD;  Location: [facility];  Service: Thoracic;  Laterality: Right;  . RIGHT/LEFT HEART CATH AND CORONARY ANGIOGRAPHY N/A 08/11/2016   Procedure: Right/Left Heart Cath and Coronary Angiography;  Surgeon: [name], MD;  Location: [facility];  Service: Cardiovascular;  Laterality: N/A;  . TEE WITHOUT CARDIOVERSION N/A 07/17/2016   Procedure: TRANSESOPHAGEAL ECHOCARDIOGRAM (TEE);  Surgeon: [name], MD;  Location: [facility];  Service: Cardiovascular;  Laterality: N/A;  . TONSILLECTOMY  1950's  . TOTAL HIP ARTHROPLASTY Left 10/05/2014   dr [name]  . TOTAL HIP ARTHROPLASTY Left 10/05/2014   Procedure: LEFT TOTAL HIP ARTHROPLASTY ANTERIOR APPROACH;  Surgeon: [name], MD;  Location: [facility];  Service: Orthopedics;  Laterality: Left;  Marland Kitchen VIDEO ASSISTED THORACOSCOPY (VATS)/WEDGE RESECTION Right 09/02/2016   Procedure: VIDEO ASSISTED THORACOSCOPY (VATS)/RIGHT UPPER LOBE WEDGE RESECTION;  Surgeon: Melrose Nakayama, MD;  Location: [facility];  Service: Thoracic;  Laterality: Right;  Marland Kitchen VIDEO BRONCHOSCOPY WITH ENDOBRONCHIAL NAVIGATION N/A 10/19/2012   Procedure: VIDEO BRONCHOSCOPY WITH ENDOBRONCHIAL NAVIGATION;  Surgeon: [name], MD;  Location: [place] [facility];  Service: Thoracic;   Laterality: N/A;  . WRIST RECONSTRUCTION  12/2009   'proximal row carpectomy" [name]    Family History  Problem Relation Age of Onset  . Heart disease Father   . Heart attack Father   . Heart disease Paternal Uncle   . Prostate cancer Neg Hx   . Colon cancer Neg Hx   . Hypertension Neg Hx   . Hyperlipidemia Neg Hx   . Diabetes Neg Hx     Social History Social History  Substance Use Topics  . Smoking status: Never Smoker  . Smokeless tobacco: Never Used  . Alcohol use 0.6 oz/week    1 Cans of beer per week    Allergies  Allergen Reactions  . Amoxicillin Itching    Has patient had a PCN reaction causing immediate rash, facial/tongue/throat swelling, SOB or lightheadedness with hypotension: No Has patient had a PCN reaction causing severe rash involving mucus membranes or skin necrosis: No Has patient had a PCN reaction that required hospitalization No Has patient had a PCN reaction occurring within the last 10 years: No If all of the above answers are "NO", then may proceed with Cephalosporin use.     Current Outpatient Prescriptions  Medication Sig Dispense Refill  . aspirin EC 81 MG tablet Take 1 tablet (81 mg total) by mouth daily. 90 tablet 3  . atorvastatin (LIPITOR) 20 MG tablet Take 1 tablet (  20 mg total) by mouth daily. 90 tablet 3  . doxycycline (VIBRA-TABS) 100 MG tablet Take 1 tablet (100 mg total) by mouth 2 (two) times daily. 14 tablet 0  . FLAXSEED, LINSEED, PO Take 20 mLs by mouth daily. Sprinkled in drink daily    . furosemide (LASIX) 40 MG tablet Take 1 tablet (40 mg total) by mouth daily. 14 tablet 0  . Multiple Vitamin (MULTIVITAMIN) tablet Take 1 tablet by mouth daily.    . naproxen sodium (ANAPROX) 220 MG tablet Take 220 mg by mouth 2 (two) times daily as needed (pain).    . Omega-3 Fatty Acids (FISH OIL) 500 MG CAPS Take 500 mg by mouth daily.     . psyllium (METAMUCIL) 58.6 % packet Take 1 packet by mouth daily.    . sodium chloride (OCEAN) 0.65 %  SOLN nasal spray Place 2 sprays into both nostrils at bedtime.    Bethann Humble Sulfate (ALLERGY RELIEF EYE DROPS OP) Apply 2 drops to eye daily as needed (allergy).    . traMADol (ULTRAM) 50 MG tablet Take 50 mg by mouth every 4-6 hours PRN severe pain. 30 tablet 0  . Undecylenic Ac-Zn Undecylenate (FUNGI-NAIL TOE & FOOT EX) Apply 1 application topically daily.     No current facility-administered medications for this visit.     Review of Systems  Constitutional: negative Eyes: negative Ears, nose, mouth, throat, and face: negative Respiratory: positive for cough, dyspnea on exertion and pleurisy/chest pain Cardiovascular: negative Gastrointestinal: negative Genitourinary:negative Integument/breast: negative Hematologic/lymphatic: negative Musculoskeletal:negative Neurological: negative Behavioral/Psych: negative Endocrine: negative Allergic/Immunologic: negative  Physical Exam  QPY:PPJKD, healthy, no distress, well nourished and well developed SKIN: skin color, texture, turgor are normal, no rashes or significant lesions HEAD: Normocephalic, No masses, lesions, tenderness or abnormalities EYES: normal, PERRLA, Conjunctiva are pink and non-injected EARS: External ears normal, Canals clear OROPHARYNX:no exudate, no erythema and lips, buccal mucosa, and tongue normal  NECK: supple, no adenopathy, no JVD LYMPH:  no palpable lymphadenopathy, no hepatosplenomegaly LUNGS: clear to auscultation , and palpation HEART: regular rate & rhythm, diastolic murmurs and no gallops ABDOMEN:abdomen soft, non-tender, normal bowel sounds and no masses or organomegaly BACK: Back symmetric, no curvature., No CVA tenderness EXTREMITIES:no joint deformities, effusion, or inflammation, no edema, no skin discoloration  NEURO: alert & oriented x 3 with fluent speech, no focal motor/sensory deficits  PERFORMANCE STATUS: ECOG 1  LABORATORY DATA: Lab Results  Component Value Date   WBC 6.6  10/01/2016   HGB 14.0 10/01/2016   HCT 42.6 10/01/2016   MCV 91.6 10/01/2016   PLT 191 10/01/2016      Chemistry      Component Value Date/Time   NA 139 09/04/2016 0427   K 3.9 09/04/2016 0427   CL 109 09/04/2016 0427   CO2 26 09/04/2016 0427   BUN 10 09/04/2016 0427   CREATININE 1.11 09/04/2016 0427   CREATININE 1.15 07/31/2016 1122      Component Value Date/Time   CALCIUM 7.7 (L) 09/04/2016 0427   ALKPHOS 28 (L) 09/04/2016 0427   AST 57 (H) 09/04/2016 0427   ALT 33 09/04/2016 0427   BILITOT 0.4 09/04/2016 0427       RADIOGRAPHIC STUDIES: Dg Chest 2 View  Result Date: 09/22/2016 CLINICAL DATA:  66 year old male post VATS wedge resection 09/02/2016. Complaining shortness of breath at times with right-sided chest discomfort. Lung cancer. Subsequent encounter. EXAM: CHEST  2 VIEW COMPARISON:  09/17/2016. FINDINGS: Slight decrease in size right apical pneumothorax now less  than 10% (crossing the posterior right third rib previously crossing below the posterior right third rib). Postsurgical changes right lung with elevation right hemidiaphragm. Central pulmonary vascular prominence without pulmonary edema. Bilateral acromioclavicular joint degenerative changes. Thoracic spine degenerative changes. No acute osseous abnormality. Heart size within normal limits. IMPRESSION: Interval slight decrease in size right pneumothorax now less than 10%. Remainder of findings without change. Electronically Signed   By: Genia Del M.D.   On: 09/22/2016 12:25   Dg Chest 2 View  Result Date: 09/17/2016 CLINICAL DATA:  History of right upper lobectomy for malignancy on September 02, 2016, now with increasing shortness of breath and chest pain. EXAM: CHEST  2 VIEW COMPARISON:  Chest x-ray of September 07, 2016 FINDINGS: There is a persistent right-sided pneumothorax which is a decrease in size since the previous study and amounts to approximately 10% of the lung volume. The pleural line lies between the  posterior third and fourth ribs. There remains pleural fluid at the right lung base. There is no mediastinal shift. There is mild right perihilar density slightly more conspicuous today. The left lung is clear. The heart and pulmonary vascularity are normal. There is calcification in the wall of the aortic arch. The bony thorax exhibits no acute abnormality. IMPRESSION: Persistent approximately 10% right apical pneumothorax. This is decreased in volume since the previous study. Slight interval increase in haziness in the right perihilar region may reflect worsening atelectasis or early pneumonia. Persistent moderate size right pleural effusion. Thoracic aortic atherosclerosis. These results will be called to the ordering clinician or representative by the Radiologist Assistant, and communication documented in the PACS or zVision Dashboard. Electronically Signed   By: David  Martinique M.D.   On: 09/17/2016 14:54   Dg Chest 2 View  Result Date: 09/07/2016 CLINICAL DATA:  Status post chest tube removal. EXAM: CHEST  2 VIEW COMPARISON:  None in PACs FINDINGS: Since chest tube removal there is been reaccumulation of an approximately 30-40% right-sided hydropneumothorax. There is patchy increased density in the right perihilar region more conspicuous today. There is no mediastinal shift. The left lung is clear. The heart and pulmonary vascularity are normal. IMPRESSION: Since chest tube removal there has been reaccumulation of a hydropneumothorax amounting to approximately 30-40% of the pleural space volume. Mild right perihilar subsegmental atelectasis. These results were called by telephone at the time of interpretation on 09/07/2016 at 7:12 am to Glean Salen, RN, who verbally acknowledged these results. Electronically Signed   By: David  Martinique M.D.   On: 09/07/2016 07:16   Dg Chest Port 1 View  Result Date: 09/06/2016 CLINICAL DATA:  Status post right-sided lobectomy. EXAM: PORTABLE CHEST 1 VIEW COMPARISON:   September 05, 2016 FINDINGS: Central catheter tip is in the superior vena cava. Chest tube remains on the right, unchanged in position. There is postoperative change on the right with volume loss. No edema or consolidation. Heart size and pulmonary vascularity are normal. No adenopathy. There are surgical clips in the lateral right hemithorax. IMPRESSION: Postoperative change on the right. Tube and catheter positions as described without pneumothorax. No edema or consolidation. Stable cardiac silhouette. Electronically Signed   By: Lowella Grip III M.D.   On: 09/06/2016 08:33   Dg Chest Port 1 View  Result Date: 09/05/2016 CLINICAL DATA:  Postsurgical radiograph, post lobectomy. EXAM: PORTABLE CHEST 1 VIEW COMPARISON:  09/04/2016 FINDINGS: Right internal jugular approach central venous catheter, and right thoracic drain are in stable position. Right-sided chest tube has been removed. Stable  postsurgical changes in the thorax with volume loss in the right hemithorax. Cardiomediastinal silhouette is normal. Mediastinal contours appear intact. There is no evidence of focal airspace consolidation. No radiographically significant pneumothorax. Osseous structures are without acute abnormality. Soft tissues are grossly normal. IMPRESSION: Stable postsurgical changes from right lobectomy. Electronically Signed   By: Fidela Salisbury M.D.   On: 09/05/2016 11:10   Dg Chest Port 1 View  Result Date: 09/04/2016 CLINICAL DATA:  Shortness of breath. EXAM: PORTABLE CHEST 1 VIEW COMPARISON:  09/03/2016 . FINDINGS: Right IJ line, right chest tubes in stable position. Heart size normal. Stable postsurgical changes right lung. No pleural effusion or pneumothorax. IMPRESSION: Right IJ line and right chest tubes in stable position. No pneumothorax. Postsurgical changes right lung. Chest is stable from prior exam . Electronically Signed   By: Marcello Moores  Register   On: 09/04/2016 07:55   Dg Chest Port 1 View  Result Date:  09/03/2016 CLINICAL DATA:  Chest tube in place EXAM: PORTABLE CHEST 1 VIEW COMPARISON:  Yesterday FINDINGS: Right jugular venous catheter and right chest tubes are stable. Low volumes. Postoperative changes at the medial right lung base. Otherwise clear lungs. Normal heart size. No pneumothorax. IMPRESSION: Stable right chest tubes without pneumothorax. Electronically Signed   By: Marybelle Killings M.D.   On: 09/03/2016 07:57   Dg Chest Port 1 View  Result Date: 09/02/2016 CLINICAL DATA:  Prior lung surgery . EXAM: PORTABLE CHEST 1 VIEW COMPARISON:  08/31/2016 . FINDINGS: Right IJ line noted with tip over superior vena cava. Two right chest tubes noted. No pneumothorax . Postsurgical changes right lung. Left lung clear. Heart size stable. Degenerative changes thoracic spine . IMPRESSION: 1. Right IJ line noted with tip over the superior vena cava. Two right chest tubes noted. No pneumothorax. 2. Postsurgical changes right lung.  No acute abnormality . Electronically Signed   By: Marcello Moores  Register   On: 09/02/2016 13:43    ASSESSMENT: This is a very pleasant 66 years old white male recently diagnosed with a stage IA (T1c, N0, M0) non-small cell lung cancer, adenocarcinoma presented with right upper lobe lung nodule is status post right upper lobectomy with lymph node dissection under the care of Dr. [name] on 09/02/2016.   PLAN: I had a lengthy discussion with the patient and his wife today about his current disease stage, prognosis and treatment options. I explained to the patient that he has early stage lung cancer and the 5 year survival for patient with his stage is around 80%. I also explained to the patient that there is no benefit for adjuvant systemic chemotherapy for stage IA non-small cell lung cancer at the current time. I recommended for the patient to continue on observation with repeat CT scan of the chest in 6 months for restaging of his disease. The patient has a history of mitral  regurgitation and he is followed by Dr. Roxy Manns. For the right-sided chest pain from the surgical scar, the patient will continue his current treatment with tramadol and naproxen as needed. He was strongly advised to call immediately if she has any concerning symptoms in the interval. The patient was seen during the multidisciplinary thoracic oncology clinic today by medical oncology, thoracic navigator, social worker and physical therapist. The patient voices understanding of current disease status and treatment options and is in agreement with the current care plan. All questions were answered. The patient knows to call the clinic with any problems, questions or concerns. We can certainly see the  patient much sooner if necessary. Thank you so much for allowing me to participate in the care of Elenor Quinones. I will continue to follow up the patient with you and assist in his care.  I spent 40 minutes counseling the patient face to face. The total time spent in the appointment was 60 minutes.  Disclaimer: This note was dictated with voice recognition software. Similar sounding words can inadvertently be transcribed and may not be corrected upon review.   Deshondra Worst K. Oct 01, 2016, 4:00 PM

## 2016-10-01 NOTE — Therapy (Signed)
[place], [place], [address] Phone: [phone]   Fax:  [phone]  Physical Therapy Evaluation  Patient Details  Name: James [name] MRN: [id number] Date of Birth: 04-02-1951 Referring Provider: [name]  Encounter Date: 10/01/2016      PT End of Session - 10/01/16 1736    Visit Number 1   Number of Visits 1   PT Start Time 4332   PT Stop Time 1710   PT Time Calculation (min) 32 min   Activity Tolerance Patient tolerated treatment well;Other (comment)  some shortness of breath with activity   Behavior During Therapy [facility] for tasks assessed/performed      Past Medical History:  Diagnosis Date  . Adenocarcinoma of right lung, stage 1 ([place]) 09/06/2016  . Anxiety   . Arthritis    "knees, hips, back" (10/19/2012)  . Chronic lower back pain   . Colon polyps    10/27/2002, repeat letter 09/17/2007  . Coronary artery disease   . Depressive disorder, not elsewhere classified   . Diabetes mellitus without complication (HCC)    diet controlled  . Fasting hyperglycemia   . GERD (gastroesophageal reflux disease)   . Hemoptysis    abnormal CT Chest 01/29/10 - ? new GG changes RUL > not viz on plain cxr 02/26/2010  . Mitral regurgitation    severe MR 08/2016  . MPN (myeloproliferative neoplasm) ([place])    1st detected 06/04/1998  . Obesity   . OSA on CPAP    last sleep study 10 years ago  . Other and unspecified hyperlipidemia   . Positive PPD 1965   "non reactive in 2012" (10/19/2012)  . Routine general medical examination at a health care facility   . Special screening for malignant neoplasm of prostate   . Spinal stenosis, unspecified region other than cervical   . Wrist pain, left     Past Surgical History:  Procedure Laterality Date  . ANTERIOR CRUCIATE LIGAMENT REPAIR Left 1967  . CARDIAC CATHETERIZATION  2000  . CHEST TUBE INSERTION Right 10/19/2012   post bronch  . COLONOSCOPY W/  POLYPECTOMY    . FLEXIBLE BRONCHOSCOPY  10/19/2012   Flexible video fiberoptic bronchoscopy with electromagnetic navigation and biopsies.  . INTRAVASCULAR PRESSURE WIRE/FFR STUDY N/A 08/11/2016   Procedure: Intravascular Pressure Wire/FFR Study;  Surgeon: [name], MD;  Location: [place] [facility];  Service: Cardiovascular;  Laterality: N/A;  . LOBECTOMY Right 09/02/2016   Procedure: RIGHT UPPER LOBECTOMY;  Surgeon: Melrose Nakayama, MD;  Location: [place];  Service: Thoracic;  Laterality: Right;  . LYMPH NODE DISSECTION Right 09/02/2016   Procedure: LYMPH NODE DISSECTION, RIGHT LUNG;  Surgeon: Melrose Nakayama, MD;  Location: [place];  Service: Thoracic;  Laterality: Right;  . RIGHT/LEFT HEART CATH AND CORONARY ANGIOGRAPHY N/A 08/11/2016   Procedure: Right/Left Heart Cath and Coronary Angiography;  Surgeon: [name], MD;  Location: [place] [facility];  Service: Cardiovascular;  Laterality: N/A;  . TEE WITHOUT CARDIOVERSION N/A 07/17/2016   Procedure: TRANSESOPHAGEAL ECHOCARDIOGRAM (TEE);  Surgeon: [name], MD;  Location: [facility] [place] [facility];  Service: Cardiovascular;  Laterality: N/A;  . TONSILLECTOMY  1950's  . TOTAL HIP ARTHROPLASTY Left 10/05/2014   dr [name]  . TOTAL HIP ARTHROPLASTY Left 10/05/2014   Procedure: LEFT TOTAL HIP ARTHROPLASTY ANTERIOR APPROACH;  Surgeon: [name], MD;  Location: [place];  Service: Orthopedics;  Laterality: Left;  Marland Kitchen VIDEO ASSISTED THORACOSCOPY (VATS)/WEDGE RESECTION Right 09/02/2016  Procedure: VIDEO ASSISTED THORACOSCOPY (VATS)/RIGHT UPPER LOBE WEDGE RESECTION;  Surgeon: Melrose Nakayama, MD;  Location: [place];  Service: Thoracic;  Laterality: Right;  Marland Kitchen VIDEO BRONCHOSCOPY WITH ENDOBRONCHIAL NAVIGATION N/A 10/19/2012   Procedure: VIDEO BRONCHOSCOPY WITH ENDOBRONCHIAL NAVIGATION;  Surgeon: [name], MD;  Location: [place];  Service: Thoracic;  Laterality: N/A;  . WRIST RECONSTRUCTION  12/2009   'proximal row carpectomy" [name]    There were  no vitals filed for this visit.       Subjective Assessment - 10/01/16 1722    Subjective "Does it hurt anything if I get that short of breath?"   Patient is accompained by: Family member  wife   Pertinent History Patient had been followed due to h/o ground glass opacity in lung noted in 2014.  Recently he presented due to not feeling well.  Work up showed enlarging nodule.  He had scans and VATS resection of right upper lobe with diagnosis of adenocarcinoma (2.6 cm.).  He will be followed but have no other treatment at this time.  Has arthritis with left total hip replacement (anterior approach) 2016 and needs left knee replacement; chronic low back pain that has not had any treatment other than medication previously; cervical stenosis, wrist reconstruction.  Never smoker.   Patient Stated Goals get info from all lung clinic providers   Currently in Pain? Yes   Pain Score 1    Pain Location Flank   Pain Orientation Right;Upper   Pain Descriptors / Indicators Sore   Pain Type Surgical pain   Pain Onset More than a month ago   Aggravating Factors  forward flexion in standing   Pain Relieving Factors tramadol            [facility] PT Assessment - 10/01/16 0001      Assessment   Medical Diagnosis right upper lobe adenocarcinoma s/p lobectomy   Referring Provider [name]   Onset Date/Surgical Date 09/02/16  lobectomy   Prior Therapy none     Precautions   Precautions Fall   Precaution Comments due to h/o total hip and need of total knee     Restrictions   Weight Bearing Restrictions No     Balance Screen   Has the patient fallen in the past 6 months No   Has the patient had a decrease in activity level because of a fear of falling?  No   Is the patient reluctant to leave their home because of a fear of falling?  No     Home Ecologist residence   Living Arrangements Spouse/significant other   Type of [place] Two  level  gets short of breath on stairs     Prior Function   Level of Independence Independent   Leisure no regular exercise currently; would like to do some pulmonary rehab     Cognition   Overall Cognitive Status Within Functional Limits for tasks assessed     Observation/Other Assessments   Observations very pleasant and interactive gentleman accompanied by his wife   Skin Integrity approx. 6 inch long scar at right upper flank, still with some scabs and mild redness from lobectomy     Coordination   Gross Motor Movements are Fluid and Coordinated Yes     Functional Tests   Functional tests Sit to Stand     Sit to Stand   Comments 20 times in 30 seconds, which is above average for  his age, but he was very short of breath after this  SpO2 of 91% after this, HR 102 bpm     Posture/Postural Control   Posture/Postural Control Postural limitations   Postural Limitations Forward head  significant forward head posture   Posture Comments Has noticeable left knee varus     ROM / Strength   AROM / PROM / Strength AROM     AROM   Overall AROM Comments standing trunk AROM:  in flexion, WFL, but this caused pain adn SOB; extension and rotation WFL; sidebend 25-50% limited bilat.     Ambulation/Gait   Ambulation/Gait Yes   Ambulation/Gait Assistance 6: Modified independent (Device/Increase time)  says he uses cane for distance   Assistive device Straight cane   Stairs --  reports shortness of breath with climbing stairs     Balance   Balance Assessed Yes     Dynamic Standing Balance   Dynamic Standing - Comments reaches forward 13 inches in standing, just below average for his age                           PT Education - 10/01/16 1735    Education provided Yes   Education Details walking, Cure article, diaphragmatic breathing, posture, cough splinting, PT info; also mentioned [employer] at the [employer]) Educated Patient;Spouse   Methods  Explanation;Handout   Comprehension Verbalized understanding               Lung Clinic Goals - 10/01/16 1743      Patient will be able to verbalize understanding of the benefit of exercise to decrease fatigue.   Status Achieved     Patient will be able to verbalize the importance of posture.   Status Achieved     Patient will be able to demonstrate diaphragmatic breathing for improved lung function.   Status Achieved     Patient will be able to verbalize understanding of the role of physical therapy to prevent functional decline and who to contact if physical therapy is needed.   Status Achieved             Plan - 10/01/16 1737    Clinical Impression Statement This is a very pleasant and motivated gentleman s/p right upper lobectomy for adenocarcinoma.  He does not require other treatment for his cancer at this time.  Incision is still healing and painful; he has significant shortness of breath with some activities; forward reach is limited.  Eval is moderate complexity with multiple comorbidities including back pain, h/o total hip replacement and need for total knee replacement; evolving status as he is still healing from lobectomy.   Rehab Potential Good   PT Frequency One time visit   PT Treatment/Interventions Patient/family education   PT Next Visit Plan Patient will need pulmonary rehab and/or physical therapy to improve his functional status with activity tolerance.   PT Home Exercise Plan walking, breathing exercise   Consulted and Agree with Plan of Care Patient      Patient will benefit from skilled therapeutic intervention in order to improve the following deficits and impairments:  Cardiopulmonary status limiting activity, Decreased activity tolerance, Abnormal gait, Postural dysfunction  Visit Diagnosis: Aftercare following surgery for neoplasm - Plan: PT plan of care cert/re-cert  Abnormal posture - Plan: PT plan of care cert/re-cert  Difficulty in  walking, not elsewhere classified - Plan: PT plan of care cert/re-cert  G-Codes - 10/01/16 1743    Functional Assessment Tool Used (Outpatient Only) clinical judgement   Functional Limitation Mobility: Walking and moving around  related to shortness of breath   Mobility: Walking and Moving Around Current Status [phone]) At least 40 percent but less than 60 percent impaired, limited or restricted   Mobility: Walking and Moving Around Goal Status [phone]) At least 40 percent but less than 60 percent impaired, limited or restricted   Mobility: Walking and Moving Around Discharge Status [phone]) At least 40 percent but less than 60 percent impaired, limited or restricted       Problem List Patient Active Problem List   Diagnosis Date Noted  . Adenocarcinoma of right lung, stage 1 ([place]) 09/06/2016  . S/P lobectomy of lung 09/02/2016  . Non-rheumatic mitral regurgitation 07/02/2016  . OA (osteoarthritis) of hip 10/05/2014  . OSA on CPAP 07/02/2014  . Chronic venous insufficiency 07/02/2014  . BPH with obstruction/lower urinary tract symptoms 07/12/2013  . Allergic rhinitis 07/12/2013  . Mass of upper lobe of right lung 10/19/2012  . Routine health maintenance 07/31/2011  . Hypogonadism male 07/29/2011  . Obesity 06/08/2007  . Type II diabetes mellitus with manifestations ([place]) 06/08/2007  . Hyperlipidemia with target LDL less than 130 01/18/2007  . GERD 01/18/2007  . Spinal stenosis of lumbar region 01/18/2007    [place] 10/01/2016, 5:45 PM  [place], [place], [address] Phone: [phone]   Fax:  [phone]  Name: James [name] MRN: [id number] Date of Birth: 1951/02/23  Serafina Royals, PT 10/01/16 5:45 PM

## 2016-10-01 NOTE — Telephone Encounter (Signed)
Left message with appt date and time and sent reminder letter.

## 2016-10-02 ENCOUNTER — Encounter: Payer: Self-pay | Admitting: *Deleted

## 2016-10-02 ENCOUNTER — Telehealth: Payer: Self-pay | Admitting: *Deleted

## 2016-10-02 DIAGNOSIS — C3491 Malignant neoplasm of unspecified part of right bronchus or lung: Secondary | ICD-10-CM

## 2016-10-02 NOTE — Telephone Encounter (Signed)
I called pulmonary rehab to update them on referral.  I then called James [name] and update him that referral has been completed and they are aware of referral as well.  He was thankful for the call and update.

## 2016-10-02 NOTE — Progress Notes (Signed)
Oncology Nurse Navigator Documentation  Oncology Nurse Navigator Flowsheets 10/02/2016  Navigator Location [facility]  Navigator Encounter Type Clinic/MDC/I spoke with patient and wife yesterday at [facility] at thoracic clinic.  I gave and explained information on lung cancer, resources and support at [facility], and next steps.  Patient updated me that Dr. [name] would like him to see pulmonary rehab.  I will follow up and make referral.   Abnormal Finding Date 08/05/2016  Confirmed Diagnosis Date 09/02/2016  Surgery Date 09/02/2016  Multidisiplinary Clinic Date 10/01/2016  Treatment Initiated Date 09/02/2016  Patient Visit Type MedOnc  Treatment Phase Other  Barriers/Navigation Needs Coordination of Care;Education  Education Newly Diagnosed Cancer Education;Other  Interventions Coordination of Care;Education  Coordination of Care Pulmonary Rehab  Education Method Verbal;Written  Support Groups/Services [employer]  Acuity Level 2  Acuity Level 2 Referrals such as genetics, survivorship;Educational needs  Time Spent with Patient 46

## 2016-10-03 ENCOUNTER — Encounter: Payer: Self-pay | Admitting: Internal Medicine

## 2016-10-09 NOTE — Progress Notes (Signed)
Cardiology Office Note    Date:  10/12/2016   ID:  James [name], DOB Feb 11, 1951, MRN [id number]  PCP:  [name], MD  Cardiologist:  Peter Martinique, MD    History of Present Illness:  James [name] is a 66 y.o. male is seen for follow up  MR. History of DM type 2 on oral therapy and HLD. He states he was in good health until he had a flu like illness earlier this year. He was seen by his physician and a heart murmur was noted. He states he has never had a heart murmur before. He was noted to have a loud murmur of MR. Careful review of his Echo suggested a partially flail leaflet. MR was graded as mild but this was felt to be an underestimate. A TEE was performed with a flail P3 segment/ruptured chord and severe MR. Cardiac catheterization performed on 08/11/2016 shows 65% mid to distal LAD lesion, FFR borderline 0.81, EF 65%, normal RV and LV filling pressure, normal cardiac output.He was also noted to have a right upper lobe nodule seen on the previous CT, he has been seen by [name] was pulmonology service, PET study obtained on 08/17/2016 showed hypermetabolic solid 2.6 cm anterior right upper lobe pulmonary nodule compatible with prior bronchogenic, no hypermetabolic thoracic lymphadenopathy or distal metastatic disease. There were nonspecific and mild asymmetric hypermetabolism in the left palatine tonsil/left tongue base region. After consultation with Dr. Roxy Manns and Dr. [name] it was recommended he undergo lobectomy for his lung mass with possible CABG with LIMA to LAD and MV repair at a later date.   He underwent RUL lobectomy in April. Pathology positive for adenoCA stage IA. Seen by oncology with plans for follow up with serial CT. He reports that initially he lost his voice and was very SOB. Seen by Dr. [name] on May 10 and given lasix and antibiotics. 5 days later CXR done and lasix stopped. Reports breathing worse over the past 1-2 weeks. States it started when he was asked by  PT to bend over. No increased swelling or cough. Notes HR increases to 120 after walking up stairs. Scheduled to see Dr. [name] next week with CXR.     Past Medical History:  Diagnosis Date  . Adenocarcinoma of right lung, stage 1 ([place]) 09/06/2016  . Anxiety   . Arthritis    "knees, hips, back" (10/19/2012)  . Chronic lower back pain   . Colon polyps    10/27/2002, repeat letter 09/17/2007  . Coronary artery disease   . Depressive disorder, not elsewhere classified   . Diabetes mellitus without complication (HCC)    diet controlled  . Fasting hyperglycemia   . GERD (gastroesophageal reflux disease)   . Hemoptysis    abnormal CT Chest 01/29/10 - ? new GG changes RUL > not viz on plain cxr 02/26/2010  . Mitral regurgitation    severe MR 08/2016  . MPN (myeloproliferative neoplasm) ([place])    1st detected 06/04/1998  . Obesity   . OSA on CPAP    last sleep study 10 years ago  . Other and unspecified hyperlipidemia   . Positive PPD 1965   "non reactive in 2012" (10/19/2012)  . Routine general medical examination at a health care facility   . Special screening for malignant neoplasm of prostate   . Spinal stenosis, unspecified region other than cervical   . Wrist pain, left     Past Surgical History:  Procedure Laterality Date  .  ANTERIOR CRUCIATE LIGAMENT REPAIR Left 1967  . CARDIAC CATHETERIZATION  2000  . CHEST TUBE INSERTION Right 10/19/2012   post bronch  . COLONOSCOPY W/ POLYPECTOMY    . FLEXIBLE BRONCHOSCOPY  10/19/2012   Flexible video fiberoptic bronchoscopy with electromagnetic navigation and biopsies.  . INTRAVASCULAR PRESSURE WIRE/FFR STUDY N/A 08/11/2016   Procedure: Intravascular Pressure Wire/FFR Study;  Surgeon: [name], MD;  Location: [facility];  Service: Cardiovascular;  Laterality: N/A;  . LOBECTOMY Right 09/02/2016   Procedure: RIGHT UPPER LOBECTOMY;  Surgeon: Melrose Nakayama, MD;  Location: [facility];  Service: Thoracic;  Laterality: Right;  .  LYMPH NODE DISSECTION Right 09/02/2016   Procedure: LYMPH NODE DISSECTION, RIGHT LUNG;  Surgeon: Melrose Nakayama, MD;  Location: [facility];  Service: Thoracic;  Laterality: Right;  . RIGHT/LEFT HEART CATH AND CORONARY ANGIOGRAPHY N/A 08/11/2016   Procedure: Right/Left Heart Cath and Coronary Angiography;  Surgeon: [name], MD;  Location: [facility];  Service: Cardiovascular;  Laterality: N/A;  . TEE WITHOUT CARDIOVERSION N/A 07/17/2016   Procedure: TRANSESOPHAGEAL ECHOCARDIOGRAM (TEE);  Surgeon: [name], MD;  Location: [facility];  Service: Cardiovascular;  Laterality: N/A;  . TONSILLECTOMY  1950's  . TOTAL HIP ARTHROPLASTY Left 10/05/2014   dr [name]  . TOTAL HIP ARTHROPLASTY Left 10/05/2014   Procedure: LEFT TOTAL HIP ARTHROPLASTY ANTERIOR APPROACH;  Surgeon: [name], MD;  Location: [facility];  Service: Orthopedics;  Laterality: Left;  Marland Kitchen VIDEO ASSISTED THORACOSCOPY (VATS)/WEDGE RESECTION Right 09/02/2016   Procedure: VIDEO ASSISTED THORACOSCOPY (VATS)/RIGHT UPPER LOBE WEDGE RESECTION;  Surgeon: Melrose Nakayama, MD;  Location: [facility];  Service: Thoracic;  Laterality: Right;  Marland Kitchen VIDEO BRONCHOSCOPY WITH ENDOBRONCHIAL NAVIGATION N/A 10/19/2012   Procedure: VIDEO BRONCHOSCOPY WITH ENDOBRONCHIAL NAVIGATION;  Surgeon: [name], MD;  Location: [facility];  Service: Thoracic;  Laterality: N/A;  . WRIST RECONSTRUCTION  12/2009   'proximal row carpectomy" [name]    Current Medications: Outpatient Medications Prior to Visit  Medication Sig Dispense Refill  . aspirin EC 81 MG tablet Take 1 tablet (81 mg total) by mouth daily. 90 tablet 3  . atorvastatin (LIPITOR) 20 MG tablet Take 1 tablet (20 mg total) by mouth daily. 90 tablet 3  . doxycycline (VIBRA-TABS) 100 MG tablet Take 1 tablet (100 mg total) by mouth 2 (two) times daily. 14 tablet 0  . FLAXSEED, LINSEED, PO Take 20 mLs by mouth daily. Sprinkled in drink daily    . furosemide (LASIX) 40 MG tablet Take 1 tablet (40 mg  total) by mouth daily. 14 tablet 0  . Multiple Vitamin (MULTIVITAMIN) tablet Take 1 tablet by mouth daily.    . naproxen sodium (ANAPROX) 220 MG tablet Take 220 mg by mouth 2 (two) times daily as needed (pain).    . Omega-3 Fatty Acids (FISH OIL) 500 MG CAPS Take 500 mg by mouth daily.     . psyllium (METAMUCIL) 58.6 % packet Take 1 packet by mouth daily.    . sodium chloride (OCEAN) 0.65 % SOLN nasal spray Place 2 sprays into both nostrils at bedtime.    Bethann Humble Sulfate (ALLERGY RELIEF EYE DROPS OP) Apply 2 drops to eye daily as needed (allergy).    . traMADol (ULTRAM) 50 MG tablet Take 50 mg by mouth every 4-6 hours PRN severe pain. 30 tablet 0  . Undecylenic Ac-Zn Undecylenate (FUNGI-NAIL TOE & FOOT EX) Apply 1 application topically daily.     No facility-administered medications prior to  visit.      Allergies:   Amoxicillin   Social History   Social History  . Marital status: Married    Spouse name: N/A  . Number of children: 3  . Years of education: N/A   Occupational History  . Retired, Financial risk analyst   . Retired, Real estate     Slow   Social History Main Topics  . Smoking status: Never Smoker  . Smokeless tobacco: Never Used  . Alcohol use 0.6 oz/week    1 Cans of beer per week  . Drug use: No  . Sexual activity: No   Other Topics Concern  . None   Social History Narrative   HSG, [employer]. Married '73.  2 sons - '74,   '76;  1 daughter -  '77  6 grandchildren.   Work - IT consultant now retired. ACP - not fully discussed.                     Family History:  The patient's family history includes Heart attack in his father; Heart disease in his father and paternal uncle.   ROS:   Please see the history of present illness.    ROS All other systems reviewed and are negative.   PHYSICAL EXAM:   VS:  BP (!) 141/84   Pulse 68   Ht 5\' 9"  (1.753 m)   Wt 224 lb 6.4 oz (101.8 kg)   BMI 33.14 kg/m    GEN: Well  nourished, well developed, in no acute distress  HEENT: normal  Neck: no JVD, carotid bruits, or masses Cardiac: RRR; there is a loud gr 3/6 harsh systolic murmur heard best at the LSB and apex radiating into the left axilla. No diastolic murmur or gallop. No edema.  Respiratory:  clear to auscultation bilaterally with some diminished BS on right. GI: soft, nontender, nondistended, + BS MS: no deformity or atrophy  Skin: warm and dry, no rash Neuro:  Alert and Oriented x 3, Strength and sensation are intact Psych: euthymic mood, full affect  Wt Readings from Last 3 Encounters:  10/12/16 224 lb 6.4 oz (101.8 kg)  10/01/16 223 lb 6.4 oz (101.3 kg)  09/22/16 219 lb (99.3 kg)      Studies/Labs Reviewed:   EKG:  EKG is not ordered today.    Recent Labs: 07/27/2016: TSH 2.14 10/01/2016: ALT 24; BUN 15.9; Creatinine 1.5; HGB 14.0; Platelets 191; Potassium 4.2; Sodium 142   Lipid Panel    Component Value Date/Time   CHOL 245 (H) 06/29/2016 1153   TRIG 198.0 (H) 06/29/2016 1153   TRIG 108 05/20/2006 0825   HDL 35.90 (L) 06/29/2016 1153   CHOLHDL 7 06/29/2016 1153   VLDL 39.6 06/29/2016 1153   LDLCALC 170 (H) 06/29/2016 1153   LDLDIRECT 177.0 07/24/2015 1052    Additional studies/ records that were reviewed today include:   Event monitor 08/14/15: Study Highlights   NSR  Average HR 64 bpm No significant arrhythmias No AV block No pauses      Echo: 07/01/16: Study Conclusions  - Left ventricle: The cavity size was mildly dilated. There was   mild concentric hypertrophy. Systolic function was normal. The   estimated ejection fraction was in the range of 60% to 65%. Wall   motion was normal; there were no regional wall motion   abnormalities. Left ventricular diastolic function parameters   were normal. - Aortic valve: Transvalvular velocity was within the normal  range.   There was no stenosis. There was no regurgitation. - Mitral valve: Transvalvular velocity was within  the normal range.   There was no evidence for stenosis. There was mild regurgitation. - Left atrium: The atrium was moderately dilated. - Right ventricle: The cavity size was normal. Wall thickness was   normal. Systolic function was normal. - Atrial septum: No defect or patent foramen ovale was identified   by color flow Doppler. - Tricuspid valve: There was trivial regurgitation. - Pulmonary arteries: Systolic pressure was within the normal   range. PA peak pressure: 24 mm Hg (S).  TEE 07/17/16: Study Conclusions  - Left ventricle: The cavity size was normal. There was mild   concentric hypertrophy. Systolic function was normal. The   estimated ejection fraction was in the range of 60% to 65%. Wall   motion was normal; there were no regional wall motion   abnormalities. - Aortic valve: No evidence of vegetation. - Mitral valve: There is a partially flail P3 segment due to   ruptured cord, seen prolapsing in both 2D and 3D images. There is   severe, posterolaterally directed mitral regurgitation which   swirls into the left atrial appendage. - Left atrium: The atrium was dilated. - Right atrium: No evidence of thrombus in the atrial cavity or   appendage. - Atrial septum: No defect or patent foramen ovale was identified.  Impressions:  - Flail P3 segment of the mitral leaflet with a rupture cord and   severe, posterolaterally directed MR which swirls in the LAA.   LVEF 60-65%.  Procedures   Intravascular Pressure Wire/FFR Study  Right/Left Heart Cath and Coronary Angiography  Conclusion     Prox LAD to Mid LAD lesion, 30 %stenosed.  Mid LAD to Dist LAD lesion, 65 %stenosed.  Prox RCA to Mid RCA lesion, 15 %stenosed.  The left ventricular systolic function is normal.  LV end diastolic pressure is normal.  The left ventricular ejection fraction is 55-65% by visual estimate.  LV end diastolic pressure is normal.   1. Borderline single vessel obstructive CAD  involving the mid LAD. FFR 0.81 2. Normal LV function EF 65% 3. Normal right heart and LV filling pressures 4. Normal Cardiac output      ASSESSMENT:    1. Severe mitral regurgitation   2. Malignant neoplasm of upper lobe of right lung ([place])   3. Coronary artery disease involving native coronary artery of native heart without angina pectoris   4. Non-rheumatic mitral regurgitation      PLAN:  In order of problems listed above:  1. Severe MR. TEE confirmed a ruptured chord involving the P3 leaflet with severe MR.  He is mildly symptomatic. Supporting the diagnosis of more significant MR is the fact that he has some LV and LA enlargement. Recommend MV repair  to reduce risk of long term sequela such as pulmonary HTN, CHF, Afib. Patient needs to first recover from RUL lobectomy. Will then consider MV repair and LIMA to LAD.  2. AdenoCA lung s/p lobectomy. Stage 1A.  3. CAD with borderline LAD stenosis FFR at cut off. Will plan LIMA when MV repair done. 4. Mixed hyperlipidemia. Now on high dose statin therapy. 5.   Dyspnea. I think a lot of this is just related to his recovery from RUL lobectomy. He has enough lasix to last one week so I have asked that he take it until he sees Dr. [name] next week and has repeat CXR.   Medication  Adjustments/Labs and Tests Ordered: Current medicines are reviewed at length with the patient today.  Concerns regarding medicines are outlined above.  Medication changes, Labs and Tests ordered today are listed in the Patient Instructions below. Patient Instructions  Go ahead and take lasix 40 mg daily until you see Dr. [name] next week.  Continue your other therapy  I will see you in 3 months.    Signed, Peter Martinique, MD  10/12/2016 10:13 AM    [facility] [address], [place], [place], [address] [phone]

## 2016-10-12 ENCOUNTER — Encounter: Payer: Self-pay | Admitting: Cardiology

## 2016-10-12 ENCOUNTER — Ambulatory Visit (INDEPENDENT_AMBULATORY_CARE_PROVIDER_SITE_OTHER): Payer: Medicare Other | Admitting: Cardiology

## 2016-10-12 ENCOUNTER — Telehealth (HOSPITAL_COMMUNITY): Payer: Self-pay | Admitting: *Deleted

## 2016-10-12 VITALS — BP 141/84 | HR 68 | Ht 69.0 in | Wt 224.4 lb

## 2016-10-12 DIAGNOSIS — I2510 Atherosclerotic heart disease of native coronary artery without angina pectoris: Secondary | ICD-10-CM

## 2016-10-12 DIAGNOSIS — I340 Nonrheumatic mitral (valve) insufficiency: Secondary | ICD-10-CM

## 2016-10-12 DIAGNOSIS — C3411 Malignant neoplasm of upper lobe, right bronchus or lung: Secondary | ICD-10-CM | POA: Diagnosis not present

## 2016-10-12 MED ORDER — FUROSEMIDE 40 MG PO TABS: 40.0000 mg | ORAL_TABLET | Freq: Every day | ORAL | 0 refills | Status: DC

## 2016-10-12 NOTE — Patient Instructions (Addendum)
Go ahead and take lasix 40 mg daily until you see Dr. [name] next week.  Continue your other therapy  I will see you in 3 months.

## 2016-10-14 ENCOUNTER — Encounter: Payer: Self-pay | Admitting: *Deleted

## 2016-10-14 NOTE — Progress Notes (Signed)
Oncology Nurse Navigator Documentation  Oncology Nurse Navigator Flowsheets 10/14/2016  Navigator Location [facility]  Navigator Encounter Type Other/I followed up on appt for pulmonary rehab. This has been set up for 10/19/16.    Barriers/Navigation Needs Coordination of Care  Interventions Coordination of Care  Coordination of Care Pulmonary Rehab  Acuity Level 1  Time Spent with Patient 15

## 2016-10-19 ENCOUNTER — Other Ambulatory Visit: Payer: Self-pay | Admitting: Thoracic Surgery (Cardiothoracic Vascular Surgery)

## 2016-10-19 ENCOUNTER — Encounter (HOSPITAL_COMMUNITY)
Admission: RE | Admit: 2016-10-19 | Discharge: 2016-10-19 | Disposition: A | Discharge status: Home / Self Care | Payer: Medicare Other | Source: Ambulatory Visit | Attending: Thoracic Surgery (Cardiothoracic Vascular Surgery) | Admitting: Thoracic Surgery (Cardiothoracic Vascular Surgery)

## 2016-10-19 ENCOUNTER — Encounter (HOSPITAL_COMMUNITY): Payer: Self-pay

## 2016-10-19 VITALS — BP 141/83 | HR 66 | Ht 67.5 in | Wt 225.8 lb

## 2016-10-19 DIAGNOSIS — C3490 Malignant neoplasm of unspecified part of unspecified bronchus or lung: Secondary | ICD-10-CM

## 2016-10-19 DIAGNOSIS — C3491 Malignant neoplasm of unspecified part of right bronchus or lung: Secondary | ICD-10-CM | POA: Diagnosis not present

## 2016-10-19 DIAGNOSIS — E784 Other hyperlipidemia: Secondary | ICD-10-CM | POA: Diagnosis not present

## 2016-10-19 DIAGNOSIS — K219 Gastro-esophageal reflux disease without esophagitis: Secondary | ICD-10-CM | POA: Insufficient documentation

## 2016-10-19 DIAGNOSIS — Z7901 Long term (current) use of anticoagulants: Secondary | ICD-10-CM | POA: Diagnosis not present

## 2016-10-19 DIAGNOSIS — I2510 Atherosclerotic heart disease of native coronary artery without angina pectoris: Secondary | ICD-10-CM | POA: Insufficient documentation

## 2016-10-19 DIAGNOSIS — G4733 Obstructive sleep apnea (adult) (pediatric): Secondary | ICD-10-CM | POA: Insufficient documentation

## 2016-10-19 DIAGNOSIS — Z79899 Other long term (current) drug therapy: Secondary | ICD-10-CM | POA: Insufficient documentation

## 2016-10-19 DIAGNOSIS — Z7982 Long term (current) use of aspirin: Secondary | ICD-10-CM | POA: Insufficient documentation

## 2016-10-19 DIAGNOSIS — F329 Major depressive disorder, single episode, unspecified: Secondary | ICD-10-CM | POA: Insufficient documentation

## 2016-10-19 DIAGNOSIS — E119 Type 2 diabetes mellitus without complications: Secondary | ICD-10-CM | POA: Diagnosis not present

## 2016-10-19 DIAGNOSIS — F419 Anxiety disorder, unspecified: Secondary | ICD-10-CM | POA: Insufficient documentation

## 2016-10-19 HISTORY — DX: Cardiac murmur, unspecified: R01.1

## 2016-10-19 NOTE — Progress Notes (Signed)
James [name] 66 y.o. male Pulmonary Rehab Orientation Note Patient arrived today in Cardiac and Pulmonary Rehab for orientation to Pulmonary Rehab. He was transported from [employer] via wheel chair. He does not carry portable oxygen. Per pt, he uses oxygen never. He does use a CPAP machine at home without oxygen though.Color good, skin warm and dry. Patient is oriented to time and place. Patient's medical history, psychosocial health, and medications reviewed. Psychosocial assessment reveals pt lives with their spouse. Pt is currently retired. 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  Pt hobbies include reading and gardening.  He is not able to have a garden this summer due to restrictions placed on him as he recovers from surgery, but wants to have one next year. Pt reports his stress level is high. Areas of stress/anxiety include Health. He is to have mitral valve surgery and a one vessel bypass later this year when he recovers from the lung surgery.  In fact that was when the lung cancer was discovered.  Pt does exhibit  signs of depression. Signs of depression include sadness and difficulty maintaining sleep. PHQ2/9 score 4/13. Pt shows fair  coping skills with positive outlook .    He is hopeful that participating in pulmonary rehab will improve his depression and quality of life by giving him increased strength and stamina so he can get back to his normal life. offered emotional support and reassurance. Will continue to monitor and evaluate progress toward psychosocial goal(s) of less shortness of breath and more strength and stamina. Physical assessment reveals heart rate is normal, breath sounds clear to auscultation, no wheezes, rales, or rhonchi. Grip strength equal, strong. Distal pulses 3+ bilateral posterior tibial pulses present with 1+ edema on right ankle and 2+ on left ankle. Patient reports he does take medications as prescribed. Patient states he follows a Regular diet. The  patient reports no specific efforts to gain or lose weight.. Patient's weight will be monitored closely. Demonstration and practice of PLB using pulse oximeter. Patient able to return demonstration satisfactorily. Safety and hand hygiene in the exercise area reviewed with patient. Patient voices understanding of the information reviewed. Department expectations discussed with patient and achievable goals were set. The patient shows enthusiasm about attending the program and we look forward to working with this nice gentleman. The patient is scheduled for a 6 min walk test on Thursday, October 23, 2015 @ 3:30 pm and to begin exercise on Thursday, October 29, 2016 in the 1: 30 pm class.   [id number]

## 2016-10-20 ENCOUNTER — Ambulatory Visit
Admission: RE | Admit: 2016-10-20 | Discharge: 2016-10-20 | Disposition: A | Discharge status: Home / Self Care | Payer: Medicare Other | Source: Ambulatory Visit | Attending: Thoracic Surgery (Cardiothoracic Vascular Surgery) | Admitting: Thoracic Surgery (Cardiothoracic Vascular Surgery)

## 2016-10-20 ENCOUNTER — Observation Stay (HOSPITAL_COMMUNITY)
Admission: AD | Admit: 2016-10-20 | Discharge: 2016-10-22 | Disposition: A | Discharge status: Home / Self Care | Payer: Medicare Other | Source: Ambulatory Visit | Attending: Thoracic Surgery (Cardiothoracic Vascular Surgery) | Admitting: Thoracic Surgery (Cardiothoracic Vascular Surgery)

## 2016-10-20 ENCOUNTER — Inpatient Hospital Stay (HOSPITAL_COMMUNITY): Payer: Medicare Other

## 2016-10-20 ENCOUNTER — Encounter (HOSPITAL_COMMUNITY): Payer: Self-pay | Admitting: Family

## 2016-10-20 ENCOUNTER — Encounter: Payer: Self-pay | Admitting: Thoracic Surgery (Cardiothoracic Vascular Surgery)

## 2016-10-20 ENCOUNTER — Ambulatory Visit (INDEPENDENT_AMBULATORY_CARE_PROVIDER_SITE_OTHER): Payer: Self-pay | Admitting: Thoracic Surgery (Cardiothoracic Vascular Surgery)

## 2016-10-20 ENCOUNTER — Other Ambulatory Visit: Payer: Self-pay | Admitting: Surgical

## 2016-10-20 VITALS — BP 140/80 | HR 76 | Resp 16 | Ht 69.0 in | Wt 217.0 lb

## 2016-10-20 DIAGNOSIS — R001 Bradycardia, unspecified: Secondary | ICD-10-CM | POA: Diagnosis not present

## 2016-10-20 DIAGNOSIS — G4733 Obstructive sleep apnea (adult) (pediatric): Secondary | ICD-10-CM | POA: Diagnosis not present

## 2016-10-20 DIAGNOSIS — Z7901 Long term (current) use of anticoagulants: Secondary | ICD-10-CM | POA: Insufficient documentation

## 2016-10-20 DIAGNOSIS — Z6832 Body mass index (BMI) 32.0-32.9, adult: Secondary | ICD-10-CM | POA: Diagnosis not present

## 2016-10-20 DIAGNOSIS — Z79891 Long term (current) use of opiate analgesic: Secondary | ICD-10-CM | POA: Insufficient documentation

## 2016-10-20 DIAGNOSIS — E785 Hyperlipidemia, unspecified: Secondary | ICD-10-CM | POA: Diagnosis not present

## 2016-10-20 DIAGNOSIS — R0602 Shortness of breath: Secondary | ICD-10-CM | POA: Diagnosis present

## 2016-10-20 DIAGNOSIS — I2699 Other pulmonary embolism without acute cor pulmonale: Secondary | ICD-10-CM | POA: Diagnosis not present

## 2016-10-20 DIAGNOSIS — R0609 Other forms of dyspnea: Secondary | ICD-10-CM | POA: Diagnosis present

## 2016-10-20 DIAGNOSIS — Z85118 Personal history of other malignant neoplasm of bronchus and lung: Secondary | ICD-10-CM | POA: Diagnosis not present

## 2016-10-20 DIAGNOSIS — I340 Nonrheumatic mitral (valve) insufficiency: Secondary | ICD-10-CM | POA: Diagnosis not present

## 2016-10-20 DIAGNOSIS — E669 Obesity, unspecified: Secondary | ICD-10-CM | POA: Diagnosis not present

## 2016-10-20 DIAGNOSIS — Z902 Acquired absence of lung [part of]: Secondary | ICD-10-CM | POA: Insufficient documentation

## 2016-10-20 DIAGNOSIS — C3411 Malignant neoplasm of upper lobe, right bronchus or lung: Secondary | ICD-10-CM

## 2016-10-20 DIAGNOSIS — Z79899 Other long term (current) drug therapy: Secondary | ICD-10-CM | POA: Diagnosis not present

## 2016-10-20 DIAGNOSIS — E8770 Fluid overload, unspecified: Secondary | ICD-10-CM | POA: Insufficient documentation

## 2016-10-20 DIAGNOSIS — Z7982 Long term (current) use of aspirin: Secondary | ICD-10-CM | POA: Diagnosis not present

## 2016-10-20 DIAGNOSIS — C3490 Malignant neoplasm of unspecified part of unspecified bronchus or lung: Secondary | ICD-10-CM

## 2016-10-20 LAB — BASIC METABOLIC PANEL
Anion gap: 8 (ref 5–15)
BUN: 17 mg/dL (ref 6–20)
CO2: 24 mmol/L (ref 22–32)
Calcium: 9.3 mg/dL (ref 8.9–10.3)
Chloride: 108 mmol/L (ref 101–111)
Creatinine, Ser: 1.05 mg/dL (ref 0.61–1.24)
GFR calc Af Amer: >60 mL/min (ref >60)
GFR calc non Af Amer: >60 mL/min (ref >60)
Glucose, Bld: 102 mg/dL — ABNORMAL HIGH (ref 65–99)
Potassium: 4.2 mmol/L (ref 3.5–5.1)
Sodium: 140 mmol/L (ref 135–145)

## 2016-10-20 LAB — CBC
HCT: 45.2 % (ref 39.0–52.0)
Hemoglobin: 14.7 g/dL (ref 13.0–17.0)
MCH: 29.2 pg (ref 26.0–34.0)
MCHC: 32.5 g/dL (ref 30.0–36.0)
MCV: 89.9 fL (ref 78.0–100.0)
Platelets: 212 10*3/uL (ref 150–400)
RBC: 5.03 MIL/uL (ref 4.22–5.81)
RDW: 13.7 % (ref 11.5–15.5)
WBC: 7.4 10*3/uL (ref 4.0–10.5)

## 2016-10-20 MED ORDER — ONDANSETRON HCL 4 MG PO TABS: 4.0000 mg | ORAL_TABLET | Freq: Four times a day (QID) | ORAL | Status: DC | PRN

## 2016-10-20 MED ORDER — HEPARIN (PORCINE) IN NACL 100-0.45 UNIT/ML-% IJ SOLN
1450.0000 [IU]/h | INTRAMUSCULAR | Status: DC
  Administered 2016-10-20: 1450 [IU]/h via INTRAVENOUS
  Filled 2016-10-20: qty 250

## 2016-10-20 MED ORDER — SODIUM CHLORIDE 0.9% FLUSH: 3.0000 mL | INTRAVENOUS | Status: DC | PRN

## 2016-10-20 MED ORDER — HEPARIN BOLUS VIA INFUSION
4500.0000 [IU] | Freq: Once | INTRAVENOUS | Status: AC
  Administered 2016-10-20: 4500 [IU] via INTRAVENOUS
  Filled 2016-10-20: qty 4500

## 2016-10-20 MED ORDER — SODIUM CHLORIDE 0.9% FLUSH
3.0000 mL | Freq: Two times a day (BID) | INTRAVENOUS | Status: DC
  Administered 2016-10-21 – 2016-10-22 (×2): 3 mL via INTRAVENOUS

## 2016-10-20 MED ORDER — ACETAMINOPHEN 325 MG PO TABS
650.0000 mg | ORAL_TABLET | Freq: Four times a day (QID) | ORAL | Status: DC | PRN
  Administered 2016-10-20 – 2016-10-21 (×2): 650 mg via ORAL
  Filled 2016-10-20 (×2): qty 2

## 2016-10-20 MED ORDER — SENNOSIDES-DOCUSATE SODIUM 8.6-50 MG PO TABS: 1.0000 | ORAL_TABLET | Freq: Every evening | ORAL | Status: DC | PRN

## 2016-10-20 MED ORDER — ACETAMINOPHEN 650 MG RE SUPP: 650.0000 mg | Freq: Four times a day (QID) | RECTAL | Status: DC | PRN

## 2016-10-20 MED ORDER — ENOXAPARIN SODIUM 40 MG/0.4ML ~~LOC~~ SOLN
40.0000 mg | SUBCUTANEOUS | Status: DC
  Filled 2016-10-20: qty 0.4

## 2016-10-20 MED ORDER — ASPIRIN EC 81 MG PO TBEC
81.0000 mg | DELAYED_RELEASE_TABLET | Freq: Every day | ORAL | Status: DC
  Administered 2016-10-21 – 2016-10-22 (×2): 81 mg via ORAL
  Filled 2016-10-20 (×2): qty 1

## 2016-10-20 MED ORDER — ATORVASTATIN CALCIUM 20 MG PO TABS
20.0000 mg | ORAL_TABLET | Freq: Every day | ORAL | Status: DC
  Administered 2016-10-20 – 2016-10-21 (×2): 20 mg via ORAL
  Filled 2016-10-20 (×2): qty 1

## 2016-10-20 MED ORDER — SODIUM CHLORIDE 0.9 % IV SOLN: 250.0000 mL | INTRAVENOUS | Status: DC | PRN

## 2016-10-20 MED ORDER — ONDANSETRON HCL 4 MG/2ML IJ SOLN: 4.0000 mg | Freq: Four times a day (QID) | INTRAMUSCULAR | Status: DC | PRN

## 2016-10-20 MED ORDER — IOPAMIDOL (ISOVUE-370) INJECTION 76%
INTRAVENOUS | Status: AC
  Administered 2016-10-20: 100 mL
  Filled 2016-10-20: qty 100

## 2016-10-20 NOTE — Progress Notes (Signed)
[address]       [place] [address]             [phone]    HPI: James [name] returns for a scheduled follow up visit  James [name] is a 66 year old gentleman who had a right upper lobectomy for stage IA non-small cell carcinoma on 09/02/2016. His postoperative course was complicated he went home on postoperative day #5.  I saw him back on 09/17/2016. He had some volume overload and I started him on Lasix. His symptoms improved after that and we stopped the Lasix.  He saw Dr. Julien Nordmann and will not require any adjuvant therapy.  He has been walking on a regular basis at home and is checking his oxygen saturation 20-25 times per day per his wife. He has been using his incentive spirometer.  He had an episode on Saturday where after walking about 100 yards he became very short of breath and his oxygen level was down to 77%. He has not tried walking outside again since then. He said he could walk to 2-1/2 miles prior to that. He does have incisional pain.  Past Medical History:  Diagnosis Date  . Adenocarcinoma of right lung, stage 1 ([place]) 09/06/2016  . Anxiety   . Arthritis    "knees, hips, back" (10/19/2012)  . Chronic lower back pain   . Colon polyps    10/27/2002, repeat letter 09/17/2007  . Coronary artery disease   . Depressive disorder, not elsewhere classified   . Diabetes mellitus without complication (HCC)    diet controlled  . Fasting hyperglycemia   . GERD (gastroesophageal reflux disease)   . Heart murmur   . Hemoptysis    abnormal CT Chest 01/29/10 - ? new GG changes RUL > not viz on plain cxr 02/26/2010  . Mitral regurgitation    severe MR 08/2016  . MPN (myeloproliferative neoplasm) ([place])    1st detected 06/04/1998  . Obesity   . OSA on CPAP    last sleep study 10 years ago  . Other and unspecified hyperlipidemia   . Positive PPD 1965   "non reactive in 2012" (10/19/2012)  . Routine general medical examination at a health care facility   .  Special screening for malignant neoplasm of prostate   . Spinal stenosis, unspecified region other than cervical   . Wrist pain, left     Current Outpatient Prescriptions  Medication Sig Dispense Refill  . aspirin EC 81 MG tablet Take 1 tablet (81 mg total) by mouth daily. 90 tablet 3  . atorvastatin (LIPITOR) 20 MG tablet Take 1 tablet (20 mg total) by mouth daily. 90 tablet 3  . FLAXSEED, LINSEED, PO Take 20 mLs by mouth daily. Sprinkled in drink daily    . Multiple Vitamin (MULTIVITAMIN) tablet Take 1 tablet by mouth daily.    . naproxen sodium (ANAPROX) 220 MG tablet Take 220 mg by mouth 2 (two) times daily as needed (pain).    . Omega-3 Fatty Acids (FISH OIL) 500 MG CAPS Take 500 mg by mouth daily.     . psyllium (METAMUCIL) 58.6 % packet Take 1 packet by mouth daily.    . sodium chloride (OCEAN) 0.65 % SOLN nasal spray Place 2 sprays into both nostrils at bedtime.    James [name] (ALLERGY RELIEF EYE DROPS OP) Apply 2 drops to eye daily as needed (allergy).    . traMADol (ULTRAM) 50 MG tablet Take 50 mg by mouth  every 4-6 hours PRN severe pain. 30 tablet 0  . Undecylenic Ac-Zn Undecylenate (FUNGI-NAIL TOE & FOOT EX) Apply 1 application topically daily.    . furosemide (LASIX) 40 MG tablet Take 1 tablet (40 mg total) by mouth daily. (Patient not taking: Reported on 10/19/2016) 30 tablet 0   No current facility-administered medications for this visit.     Physical Exam BP 140/80 (BP Location: Right Arm, Patient Position: Sitting, Cuff Size: Large)   Pulse 76   Resp 16   Ht 5\' 9"  (1.753 m)   Wt 217 lb (98.4 kg)   SpO2 96% Comment: ON RA  BMI 32.60 kg/m  66 year old man anxious Alert and oriented 3 with no deficits Cardiac regular rate and rhythm with a 3/6 systolic murmur Lungs absent breath sounds right base, otherwise clear Trace peripheral edema  Diagnostic Tests: CHEST  2 VIEW  COMPARISON:  09/22/2016  FINDINGS: No longer any residual pleural air  on the right. Previous lobectomy on the right with elevation of the right hemidiaphragm. Remaining right lung appears clear. Left lung is clear. No effusions. Ordinary degenerative changes affect the spine.  IMPRESSION: Previous right lobectomy. No active disease presently. Remaining lung appears clear.   Electronically Signed   By: Nelson Chimes M.D.   On: 10/20/2016 14:08 I personally reviewed the chest x-ray confirmed the findings noted above  Impression: James [name] is a 66 year old gentleman who is about 6 weeks out from a right upper lobectomy for stage IA adenocarcinoma. He also has severe mitral regurgitation and will require mitral valve repair in the near future.  He says he was making good progress until a few days ago when he developed shortness of breath with very little exertion and also noted that his oxygen saturations dropped significantly at the same time. He does have mitral regurgitation but is not in overt congestive heart failure on exam. Given his preoperative pulmonary status he should be a little tolerate a lobectomy without this degree of dyspnea. My concern is that he could possibly have pulmonary emboli and that needs to be ruled out.  Plan: Admit to hospital CT angiogram to rule out pulmonary embolus  Melrose Nakayama, MD [facility] [phone]

## 2016-10-20 NOTE — Progress Notes (Signed)
ANTICOAGULATION CONSULT NOTE - Initial Consult  Pharmacy Consult:  Heparin Indication:  New PE  Allergies  Allergen Reactions  . Amoxicillin Itching and Other (See Comments)    Has patient had a PCN reaction causing immediate rash, facial/tongue/throat swelling, SOB or lightheadedness with hypotension: No Has patient had a PCN reaction causing severe rash involving mucus membranes or skin necrosis: No Has patient had a PCN reaction that required hospitalization No Has patient had a PCN reaction occurring within the last 10 years: No If all of the above answers are "NO", then may proceed with Cephalosporin use.     Patient Measurements: Height: 5\' 9"  (175.3 cm) Weight: 220 lb 10.9 oz (100.1 kg) IBW/kg (Calculated) : 70.7 Heparin Dosing Weight: 92 kg  Vital Signs: Temp: 98.5 F (36.9 C) (06/12 1531) Temp Source: Oral (06/12 1531) BP: 147/88 (06/12 1531) Pulse Rate: 76 (06/12 1531)  Labs:  Recent Labs  10/20/16 1557  HGB 14.7  HCT 45.2  PLT 212  CREATININE 1.05    Estimated Creatinine Clearance: 79.7 mL/min (by C-G formula based on SCr of 1.05 mg/dL).   Medical History: Past Medical History:  Diagnosis Date  . Adenocarcinoma of right lung, stage 1 ([place]) 09/06/2016  . Anxiety   . Arthritis    "knees, hips, back" (10/19/2012)  . Chronic lower back pain   . Colon polyps    10/27/2002, repeat letter 09/17/2007  . Coronary artery disease   . Depressive disorder, not elsewhere classified   . Diabetes mellitus without complication (HCC)    diet controlled  . Fasting hyperglycemia   . GERD (gastroesophageal reflux disease)   . Heart murmur   . Hemoptysis    abnormal CT Chest 01/29/10 - ? new GG changes RUL > not viz on plain cxr 02/26/2010  . Mitral regurgitation    severe MR 08/2016  . MPN (myeloproliferative neoplasm) ([place])    1st detected 06/04/1998  . Obesity   . OSA on CPAP    last sleep study 10 years ago  . Other and unspecified hyperlipidemia   . Positive  PPD 1965   "non reactive in 2012" (10/19/2012)  . Routine general medical examination at a health care facility   . Special screening for malignant neoplasm of prostate   . Spinal stenosis, unspecified region other than cervical   . Wrist pain, left       Assessment: 68 YOM with a history of right upper lobectomy for NSCLC on 09/02/16.  Patient was seen at [place] office for SOB and then sent to [facility] for imaging.  CTA positive for RLL PE and Pharmacy consulted to initiate IV heparin.  Baseline labs and home meds reviewed.   Goal of Therapy:  Heparin level 0.3-0.7 units/ml Monitor platelets by anticoagulation protocol: Yes    Plan:  - Heparin 4500 units IV bolus x 1, then - Heparin gtt at 1450 units/hr - Check 6 hr heparin level - Daily heparin level and CBC   Lakeita Panther D. Mina Marble, PharmD, BCPS Pager:  [phone] 10/20/2016, 7:11 PM

## 2016-10-20 NOTE — Progress Notes (Signed)
Pt received from Dr. Leonarda Salon office. Pt and family oriented to room and equipment. VSS. Telemetry applied, CCMD notified. IV placed, labs drawn - CT notified.   Fritz Pickerel, RN

## 2016-10-20 NOTE — Progress Notes (Signed)
CT surgery p.m. Note  Notified by radiology that this patient , status post recent right upper lobectomy by Dr. [name], has right lower lobe pulmonary artery emboli by CTA performed earlier today. I reviewed the images personally. There is no evidence of significant effusion, pneumonia or pneumothorax IV heparin per pharmacy has been ordered Room air saturation 98%

## 2016-10-20 NOTE — H&P (Signed)
Subjective:  The patient is a 66 year old gentleman who had a right upper lobectomy for stage I a non-small cell carcinoma on 09/02/2016 by Dr. [name]. He had an uncomplicated postoperative course. He was seen in the office on 09/17/2016 by Dr. [name] and showed some evidence of volume overload. Dr. [name] put him on Lasix at that time and his symptoms improved. Lasix has subsequently been stopped. At home the patient has been checking his oxygen saturation frequently and last Saturday had episode where it dropped to 77% after walking approximately 100 yards. He was associated with shortness of breath. Prior to that he states he could walk pretty well up to 2 and half miles without significant difficulty. He does have some incisional pain. Otherwise he feels pretty well. After being seen in the office on today's date Dr. [name] felt due to the episode of decreased saturations and increased shortness of breath that he should undergo a CT angioma to rule out pulmonary embolism. The patient does have a history of severe mitral regurgitation by transesophageal echocardiogram done in March 2018.  Patient Active Problem List   Diagnosis Date Noted  . Adenocarcinoma of right lung, stage 1 ([place]) 09/06/2016  . S/P lobectomy of lung 09/02/2016  . Non-rheumatic mitral regurgitation 07/02/2016  . OA (osteoarthritis) of hip 10/05/2014  . OSA on CPAP 07/02/2014  . Chronic venous insufficiency 07/02/2014  . BPH with obstruction/lower urinary tract symptoms 07/12/2013  . Allergic rhinitis 07/12/2013  . Mass of upper lobe of right lung 10/19/2012  . Routine health maintenance 07/31/2011  . Hypogonadism male 07/29/2011  . Obesity 06/08/2007  . Type II diabetes mellitus with manifestations ([place]) 06/08/2007  . Hyperlipidemia with target LDL less than 130 01/18/2007  . GERD 01/18/2007  . Spinal stenosis of lumbar region 01/18/2007   Past Medical History:  Diagnosis Date  . Adenocarcinoma of  right lung, stage 1 ([place]) 09/06/2016  . Anxiety   . Arthritis    "knees, hips, back" (10/19/2012)  . Chronic lower back pain   . Colon polyps    10/27/2002, repeat letter 09/17/2007  . Coronary artery disease   . Depressive disorder, not elsewhere classified   . Diabetes mellitus without complication (HCC)    diet controlled  . Fasting hyperglycemia   . GERD (gastroesophageal reflux disease)   . Heart murmur   . Hemoptysis    abnormal CT Chest 01/29/10 - ? new GG changes RUL > not viz on plain cxr 02/26/2010  . Mitral regurgitation    severe MR 08/2016  . MPN (myeloproliferative neoplasm) ([place])    1st detected 06/04/1998  . Obesity   . OSA on CPAP    last sleep study 10 years ago  . Other and unspecified hyperlipidemia   . Positive PPD 1965   "non reactive in 2012" (10/19/2012)  . Routine general medical examination at a health care facility   . Special screening for malignant neoplasm of prostate   . Spinal stenosis, unspecified region other than cervical   . Wrist pain, left     Past Surgical History:  Procedure Laterality Date  . ANTERIOR CRUCIATE LIGAMENT REPAIR Left 1967  . CARDIAC CATHETERIZATION  2000  . CHEST TUBE INSERTION Right 10/19/2012   post bronch  . COLONOSCOPY W/ POLYPECTOMY    . FLEXIBLE BRONCHOSCOPY  10/19/2012   Flexible video fiberoptic bronchoscopy with electromagnetic navigation and biopsies.  . INTRAVASCULAR PRESSURE WIRE/FFR STUDY N/A 08/11/2016   Procedure: Intravascular Pressure Wire/FFR Study;  Surgeon: [name],  MD;  Location: [place] [facility];  Service: Cardiovascular;  Laterality: N/A;  . LOBECTOMY Right 09/02/2016   Procedure: RIGHT UPPER LOBECTOMY;  Surgeon: Melrose Nakayama, MD;  Location: [facility];  Service: Thoracic;  Laterality: Right;  . LYMPH NODE DISSECTION Right 09/02/2016   Procedure: LYMPH NODE DISSECTION, RIGHT LUNG;  Surgeon: Melrose Nakayama, MD;  Location: [facility];  Service: Thoracic;  Laterality: Right;  . RIGHT/LEFT HEART CATH  AND CORONARY ANGIOGRAPHY N/A 08/11/2016   Procedure: Right/Left Heart Cath and Coronary Angiography;  Surgeon: [name], MD;  Location: [facility];  Service: Cardiovascular;  Laterality: N/A;  . TEE WITHOUT CARDIOVERSION N/A 07/17/2016   Procedure: TRANSESOPHAGEAL ECHOCARDIOGRAM (TEE);  Surgeon: [name], MD;  Location: [facility]OSCOPY;  Service: Cardiovascular;  Laterality: N/A;  . TONSILLECTOMY  1950's  . TOTAL HIP ARTHROPLASTY Left 10/05/2014   dr [name]  . TOTAL HIP ARTHROPLASTY Left 10/05/2014   Procedure: LEFT TOTAL HIP ARTHROPLASTY ANTERIOR APPROACH;  Surgeon: [name], MD;  Location: [facility];  Service: Orthopedics;  Laterality: Left;  Marland Kitchen VIDEO ASSISTED THORACOSCOPY (VATS)/WEDGE RESECTION Right 09/02/2016   Procedure: VIDEO ASSISTED THORACOSCOPY (VATS)/RIGHT UPPER LOBE WEDGE RESECTION;  Surgeon: Melrose Nakayama, MD;  Location: [facility];  Service: Thoracic;  Laterality: Right;  Marland Kitchen VIDEO BRONCHOSCOPY WITH ENDOBRONCHIAL NAVIGATION N/A 10/19/2012   Procedure: VIDEO BRONCHOSCOPY WITH ENDOBRONCHIAL NAVIGATION;  Surgeon: [name], MD;  Location: [facility];  Service: Thoracic;  Laterality: N/A;  . WRIST RECONSTRUCTION  12/2009   'proximal row carpectomy" [name]    Prescriptions Prior to Admission  Medication Sig Dispense Refill Last Dose  . aspirin EC 81 MG tablet Take 1 tablet (81 mg total) by mouth daily. 90 tablet 3 Taking  . atorvastatin (LIPITOR) 20 MG tablet Take 1 tablet (20 mg total) by mouth daily. 90 tablet 3 Taking  . FLAXSEED, LINSEED, PO Take 20 mLs by mouth daily. Sprinkled in drink daily   Taking  . furosemide (LASIX) 40 MG tablet Take 1 tablet (40 mg total) by mouth daily. (Patient not taking: Reported on 10/19/2016) 30 tablet 0 Not Taking  . Multiple Vitamin (MULTIVITAMIN) tablet Take 1 tablet by mouth daily.   Taking  . naproxen sodium (ANAPROX) 220 MG tablet Take 220 mg by mouth 2 (two) times daily as needed (pain).   Taking  . Omega-3 Fatty Acids (FISH OIL) 500  MG CAPS Take 500 mg by mouth daily.    Taking  . psyllium (METAMUCIL) 58.6 % packet Take 1 packet by mouth daily.   Taking  . sodium chloride (OCEAN) 0.65 % SOLN nasal spray Place 2 sprays into both nostrils at bedtime.   Taking  . Tetrahydrozoline-Zn Sulfate (ALLERGY RELIEF EYE DROPS OP) Apply 2 drops to eye daily as needed (allergy).   Taking  . traMADol (ULTRAM) 50 MG tablet Take 50 mg by mouth every 4-6 hours PRN severe pain. 30 tablet 0 Taking  . Undecylenic Ac-Zn Undecylenate (FUNGI-NAIL TOE & FOOT EX) Apply 1 application topically daily.   Taking   Allergies  Allergen Reactions  . Amoxicillin Itching    Has patient had a PCN reaction causing immediate rash, facial/tongue/throat swelling, SOB or lightheadedness with hypotension: No Has patient had a PCN reaction causing severe rash involving mucus membranes or skin necrosis: No Has patient had a PCN reaction that required hospitalization No Has patient had a PCN reaction occurring within the last 10 years: No If all of the above answers are "NO", then  may proceed with Cephalosporin use.     Social History  Substance Use Topics  . Smoking status: Never Smoker  . Smokeless tobacco: Never Used  . Alcohol use 0.6 oz/week    1 Cans of beer per week    Family History  Problem Relation Age of Onset  . Heart failure Mother   . Heart disease Father   . Heart attack Father   . Heart disease Paternal Uncle   . Prostate cancer Neg Hx   . Colon cancer Neg Hx   . Hypertension Neg Hx   . Hyperlipidemia Neg Hx   . Diabetes Neg Hx     Review of Systems Review of Systems  Constitutional: Positive for malaise/fatigue.  HENT: Negative.   Eyes: Negative.   Respiratory: Positive for shortness of breath.   Cardiovascular: Positive for palpitations, orthopnea and leg swelling.  Gastrointestinal: Positive for constipation and diarrhea.  Genitourinary: Negative.   Musculoskeletal: Negative.   Skin: Negative.   Neurological: Negative.    Psychiatric/Behavioral: The patient is nervous/anxious.     Objective:   No data found.  No intake/output data recorded. No intake/output data recorded.    There were no vitals taken for this visit. General appearance: alert, cooperative and no distress Head: Normocephalic, without obvious abnormality, atraumatic Eyes: conjunctivae/corneas clear. PERRL, EOM's intact. Fundi benign. Throat: lips, mucosa, and tongue normal; teeth and gums normal Neck: no adenopathy, no carotid bruit, no JVD, supple, symmetrical, trachea midline and thyroid not enlarged, symmetric, no tenderness/mass/nodules Back: symmetric, no curvature. ROM normal. No CVA tenderness. Lungs: clear to auscultation bilaterally Chest wall: no tenderness Heart: regular rate and rhythm and 2/6 systolic mitral murmur Abdomen: soft, non-tender; bowel sounds normal; no masses,  no organomegaly Extremities: edema + LE Pulses: 2+ and symmetric Skin: Skin color, texture, turgor normal. No rashes or lesions   Assessment:  Shortness of breath after recent right upper lobectomy Known severe MR with some CHF sx  Patient Active Problem List   Diagnosis Date Noted  . Shortness of breath 10/20/2016  . Adenocarcinoma of right lung, stage 1 ([place]) 09/06/2016  . S/P lobectomy of lung 09/02/2016  . Non-rheumatic mitral regurgitation 07/02/2016  . OA (osteoarthritis) of hip 10/05/2014  . OSA on CPAP 07/02/2014  . Chronic venous insufficiency 07/02/2014  . BPH with obstruction/lower urinary tract symptoms 07/12/2013  . Allergic rhinitis 07/12/2013  . Mass of upper lobe of right lung 10/19/2012  . Routine health maintenance 07/31/2011  . Hypogonadism male 07/29/2011  . Obesity 06/08/2007  . Type II diabetes mellitus with manifestations ([place]) 06/08/2007  . Hyperlipidemia with target LDL less than 130 01/18/2007  . GERD 01/18/2007  . Spinal stenosis of lumbar region 01/18/2007      Plan:   We will check CT Angio to rule  out pulmonary embolism. MR may be playing a role, will ask cardiology to assist with management

## 2016-10-21 DIAGNOSIS — R0602 Shortness of breath: Secondary | ICD-10-CM

## 2016-10-21 DIAGNOSIS — I2699 Other pulmonary embolism without acute cor pulmonale: Secondary | ICD-10-CM | POA: Diagnosis not present

## 2016-10-21 LAB — CBC
HCT: 43 % (ref 39.0–52.0)
Hemoglobin: 14 g/dL (ref 13.0–17.0)
MCH: 29.2 pg (ref 26.0–34.0)
MCHC: 32.6 g/dL (ref 30.0–36.0)
MCV: 89.6 fL (ref 78.0–100.0)
Platelets: 199 10*3/uL (ref 150–400)
RBC: 4.8 MIL/uL (ref 4.22–5.81)
RDW: 13.7 % (ref 11.5–15.5)
WBC: 6.4 10*3/uL (ref 4.0–10.5)

## 2016-10-21 LAB — BASIC METABOLIC PANEL
Anion gap: 9 (ref 5–15)
BUN: 15 mg/dL (ref 6–20)
CO2: 22 mmol/L (ref 22–32)
Calcium: 8.6 mg/dL — ABNORMAL LOW (ref 8.9–10.3)
Chloride: 107 mmol/L (ref 101–111)
Creatinine, Ser: 1.01 mg/dL (ref 0.61–1.24)
GFR calc Af Amer: >60 mL/min (ref >60)
GFR calc non Af Amer: >60 mL/min (ref >60)
Glucose, Bld: 99 mg/dL (ref 65–99)
Potassium: 3.9 mmol/L (ref 3.5–5.1)
Sodium: 138 mmol/L (ref 135–145)

## 2016-10-21 LAB — HEPARIN LEVEL (UNFRACTIONATED)
Heparin Unfractionated: >2.2 IU/mL — ABNORMAL HIGH (ref 0.30–0.70)
Heparin Unfractionated: 0.37 IU/mL (ref 0.30–0.70)

## 2016-10-21 LAB — BRAIN NATRIURETIC PEPTIDE: B Natriuretic Peptide: 7.8 pg/mL (ref 0.0–100.0)

## 2016-10-21 MED ORDER — RIVAROXABAN 15 MG PO TABS
15.0000 mg | ORAL_TABLET | Freq: Two times a day (BID) | ORAL | Status: DC
  Administered 2016-10-21 – 2016-10-22 (×2): 15 mg via ORAL
  Filled 2016-10-21 (×2): qty 1

## 2016-10-21 MED ORDER — POTASSIUM CHLORIDE CRYS ER 20 MEQ PO TBCR
20.0000 meq | EXTENDED_RELEASE_TABLET | Freq: Once | ORAL | Status: AC
  Administered 2016-10-21: 20 meq via ORAL
  Filled 2016-10-21: qty 1

## 2016-10-21 MED ORDER — FUROSEMIDE 40 MG PO TABS
40.0000 mg | ORAL_TABLET | Freq: Every day | ORAL | Status: DC
  Administered 2016-10-21 – 2016-10-22 (×2): 40 mg via ORAL
  Filled 2016-10-21 (×2): qty 1

## 2016-10-21 MED ORDER — HEPARIN (PORCINE) IN NACL 100-0.45 UNIT/ML-% IJ SOLN
1150.0000 [IU]/h | INTRAMUSCULAR | Status: DC
  Administered 2016-10-21: 1150 [IU]/h via INTRAVENOUS
  Filled 2016-10-21: qty 250

## 2016-10-21 MED ORDER — POTASSIUM CHLORIDE CRYS ER 20 MEQ PO TBCR
20.0000 meq | EXTENDED_RELEASE_TABLET | Freq: Every day | ORAL | Status: DC
  Administered 2016-10-21 – 2016-10-22 (×2): 20 meq via ORAL
  Filled 2016-10-21 (×3): qty 1

## 2016-10-21 MED ORDER — RIVAROXABAN 15 MG PO TABS: 15.0000 mg | ORAL_TABLET | Freq: Two times a day (BID) | ORAL | Status: DC

## 2016-10-21 MED ORDER — RIVAROXABAN 20 MG PO TABS: 20.0000 mg | ORAL_TABLET | Freq: Every day | ORAL | Status: DC

## 2016-10-21 NOTE — Progress Notes (Addendum)
[address]       [place] [address]             [phone]           Subjective: Patient sitting in chair. He wants to know when he can go home.   Objective: Vital signs in last 24 hours: Temp:  [98.3 F (36.8 C)-98.7 F (37.1 C)] 98.3 F (36.8 C) (06/13 0327) Pulse Rate:  [56-76] 56 (06/13 0327) Cardiac Rhythm: Normal sinus rhythm (06/13 0700) Resp:  [16-18] 18 (06/13 0327) BP: (129-155)/(71-88) 129/71 (06/13 0327) SpO2:  [96 %-99 %] 99 % (06/13 0327) Weight:  [98.4 kg (217 lb)-100.1 kg (220 lb 10.9 oz)] 100.1 kg (220 lb 10.9 oz) (06/12 1531)    Intake/Output from previous day: 06/12 0701 - 06/13 0700 In: 640 [P.O.:490; I.V.:150] Out: 400 [Urine:400]   Physical Exam:  Cardiovascular: RRR Pulmonary: Clear to auscultation bilaterally Wounds: Clean and dry.     Lab Results: CBC: Recent Labs  10/20/16 1557 10/21/16 0222  WBC 7.4 6.4  HGB 14.7 14.0  HCT 45.2 43.0  PLT 212 199   BMET:  Recent Labs  10/20/16 1557 10/21/16 0222  NA 140 138  K 4.2 3.9  CL 108 107  CO2 24 22  GLUCOSE 102* 99  BUN 17 15  CREATININE 1.05 1.01  CALCIUM 9.3 8.6*    PT/INR: No results for input(s): LABPROT, INR in the last 72 hours. ABG:  INR: Will add last result for INR, ABG once components are confirmed Will add last 4 CBG results once components are confirmed CT ANGIOGRAPHY CHEST WITH CONTRAST  TECHNIQUE: Multidetector CT imaging of the chest was performed using the standard protocol during bolus administration of intravenous contrast. Multiplanar CT image reconstructions and MIPs were obtained to evaluate the vascular anatomy.  CONTRAST:  65 cc Isovue 370 intravenous  COMPARISON:  Radiograph 10/20/2016, CT chest 08/05/2016, 09/12/2012  FINDINGS: Cardiovascular: Satisfactory opacification of the pulmonary arteries to the segmental level. Nonocclusive filling defect within segmental and subsegmental branch of right lower lobe  pulmonary artery. No significant elevation of RV LV ratio. Narrowed appearance of left lower lobe pulmonary artery branch vessel with linear defects, noted on previous exam and suggestive of sequela of chronic PE. No definite acute filling defects on the left. Respiratory motion artifact limits evaluation of the left upper and lower lobe pulmonary distal branch vessels. Non aneurysmal aorta. Mild atherosclerosis. Minimal coronary artery calcification. The heart does not appear enlarged. No significant pericardial effusion.  Mediastinum/Nodes: Esophagus within normal limits. No significantly enlarged mediastinal or hilar nodes. Midline trachea. Thyroid is normal.  Lungs/Pleura: Status post right upper lobectomy. Small right-sided pleural effusion. No pneumothorax. Right lower lobe pulmonary nodule measuring 8 mm, series 7, image number 39, felt to correspond to a previously stable right lower lobe pulmonary nodule with differences in positioning likely secondary to new postsurgical changes.  Upper Abdomen: Stable 4.7 cm cyst in the posterior right hepatic lobe, and a 2 cm cyst in the left hepatic lobe.  Musculoskeletal: Degenerative changes of the spine. No acute or suspicious bone lesions.  Review of the MIP images confirms the above findings.  IMPRESSION: 1. Positive for small segmental and subsegmental right lower lobe pulmonary embolus. No elevation of the RV LV ratio. Findings suggestive of sequela of chronic PE in a left lower lobe pulmonary artery, noted on prior study. 2. Status post right upper lobectomy. Small right-sided pleural effusion. Right lower lobe 8  mm pulmonary nodule, thought to correspond to a previously stable 4 mm with slight differences in location thought related to interval postsurgical change. CT follow-up in 3-6 months may be considered to assess for further interval growth. 3. Stable hepatic cysts Critical Value/emergent results were called  by telephone at the time of interpretation on 10/20/2016 at 6:39 pm to Dr. Dr. Darcey Nora, who verbally acknowledged these results.  Aortic Atherosclerosis (ICD10-I70.0).   Electronically Signed   By: Donavan Foil M.D.   On: 10/20/2016 18:39  Assessment/Plan:  1. CV - SBP intermittently in the 140's. No history of hypertension prior to admission. He is a very anxious patient-will watch for now. 2.  Pulmonary - CTA yesterday showed small segmental and subsegmental RLL pulmonary embolus. Continue Heparin drip per pharmacy. Will  consider Coumadin or NOAC as well. Patient states he took Xarelto before and did well with it. 3. Supplement potassium  ZIMMERMAN,DONIELLE MPA-C 10/21/2016,7:47 AM Patient seen and examined, agree with above He does have a small, non-occlusive PE in a RLL PA branch and sequelae of an old PE on the left. Heparin started last night- will transition to a NOAC, he has used Xarelto before Will see how he does with ambulation today The PE seen on CT is not nearly enough to account for his recent dramatic increase in dyspnea. I am going to put him back on lasix  Remo Lipps C. [name], MD [facility] [phone]

## 2016-10-21 NOTE — Discharge Instructions (Signed)
Pulmonary Embolism °A pulmonary embolism (PE) is a sudden blockage or decrease of blood flow in one lung or both lungs. Most blockages come from a blood clot that travels from the legs or the pelvis to the lungs. PE is a dangerous and potentially life-threatening condition if it is not treated right away. °What are the causes? °A pulmonary embolism occurs most commonly when a blood clot travels from one of your veins to your lungs. Rarely, PE is caused by air, fat, amniotic fluid, or part of a tumor traveling through your veins to your lungs. °What increases the risk? °A PE is more likely to develop in: °· People who smoke. °· People who are older, especially over 60 years of age. °· People who are overweight (obese). °· People who sit or lie still for a long time, such as during long-distance travel (over 4 hours), bed rest, hospitalization, or during recovery from certain medical conditions like a stroke. °· People who do not engage in much physical activity (sedentary lifestyle). °· People who have chronic breathing disorders. °· People who have a personal or family history of blood clots or blood clotting disease. °· People who have peripheral vascular disease (PVD), diabetes, or some types of cancer. °· People who have heart disease, especially if the person had a recent heart attack or has congestive heart failure. °· People who have neurological diseases that affect the legs (leg paresis). °· People who have had a traumatic injury, such as breaking a hip or leg. °· People who have recently had major or lengthy surgery, especially on the hip, knee, or abdomen. °· People who have had a central line placed inside a large vein. °· People who take medicines that contain the hormone estrogen. These include birth control pills and hormone replacement therapy. °· Pregnancy or during childbirth or the postpartum period. ° °What are the signs or symptoms? °The symptoms of a PE usually start suddenly and  include: °· Shortness of breath while active or at rest. °· Coughing or coughing up blood or blood-tinged mucus. °· Chest pain that is often worse with deep breaths. °· Rapid or irregular heartbeat. °· Feeling light-headed or dizzy. °· Fainting. °· Feeling anxious. °· Sweating. ° °There may also be pain and swelling in a leg if that is where the blood clot started. °These symptoms may represent a serious problem that is an emergency. Do not wait to see if the symptoms will go away. Get medical help right away. Call your local emergency services (911 in the [place] Do not drive yourself to the hospital. °How is this diagnosed? °Your health care provider will take a medical history and perform a physical exam. You may also have other tests, including: °· Blood tests to assess the clotting properties of your blood, assess oxygen levels in your blood, and find blood clots. °· Imaging tests, such as CT, ultrasound, MRI, X-ray, and other tests to see if you have clots anywhere in your body. °· An electrocardiogram (ECG) to look for heart strain from blood clots in the lungs. ° °How is this treated? °The main goals of PE treatment are: °· To stop a blood clot from growing larger. °· To stop new blood clots from forming. ° °The type of treatment that you receive depends on many factors, such as the cause of your PE, your risk for bleeding or developing more clots, and other medical conditions that you have. Sometimes, a combination of treatments is necessary. °This condition may be treated with: °· Medicines, including newer oral blood thinners (  anticoagulants), warfarin, low molecular weight heparins, thrombolytics, or heparins. °· Wearing compression stockings or using different types of devices. °· Surgery (rare) to remove the blood clot or to place a filter in your abdomen to stop the blood clot from traveling to your lungs. ° °Treatments for a PE are often divided into immediate treatment, long-term treatment (up to 3  months after PE), and extended treatment (more than 3 months after PE). Your treatment may continue for several months. This is called maintenance therapy, and it is used to prevent the forming of new blood clots. You can work with your health care provider to choose the treatment program that is best for you. °What are anticoagulants? °Anticoagulants are medicines that treat PEs. They can stop current blood clots from growing and stop new clots from forming. They cannot dissolve existing clots. Your body dissolves clots by itself over time. Anticoagulants are given by mouth, by injection, or through an IV tube. °What are thrombolytics? °Thrombolytics are clot-dissolving medicines that are used to dissolve a PE. They carry a high risk of bleeding, so they tend to be used only in severe cases or if you have very low blood pressure. °Follow these instructions at home: °If you are taking a newer oral anticoagulant: °· Take the medicine every single day at the same time each day. °· Understand what foods and drugs interact with this medicine. °· Understand that there are no regular blood tests required when using this medicine. °· Understand the side effects of this medicine, including excessive bruising or bleeding. Ask your health care provider or pharmacist about other possible side effects. °If you are taking warfarin: °· Understand how to take warfarin and know which foods can affect how warfarin works in your body. °· Understand that it is dangerous to take too much or too little warfarin. Too much warfarin increases the risk of bleeding. Too little warfarin continues to allow the risk for blood clots. °· Follow your PT and INR blood testing schedule. The PT and INR results allow your health care provider to adjust your dose of warfarin. It is very important that you have your PT and INR tested as often as told by your health care provider. °· Avoid major changes in your diet, or tell your health care provider  before you change your diet. Arrange a visit with a registered dietitian to answer your questions. Many foods, especially foods that are high in vitamin K, can interfere with warfarin and affect the PT and INR results. Eat a consistent amount of foods that are high in vitamin K, such as: °? Spinach, kale, broccoli, cabbage, collard greens, turnip greens, Brussels sprouts, peas, cauliflower, seaweed, and parsley. °? Beef liver and pork liver. °? Green tea. °? Soybean oil. °· Tell your health care provider about any and all medicines, vitamins, and supplements that you take, including aspirin and other over-the-counter anti-inflammatory medicines. Be especially cautious with aspirin and anti-inflammatory medicines. Do not take those before you ask your health care provider if it is safe to do so. This is important because many medicines can interfere with warfarin and affect the PT and INR results. °· Do not start or stop taking any over-the-counter or prescription medicine unless your health care provider or pharmacist tells you to do so. °If you take warfarin, you will also need to do these things: °· Hold pressure over cuts for longer than usual. °· Tell your dentist and other health care providers that you are taking warfarin before you have   any procedures in which bleeding may occur. °· Avoid alcohol or drink very small amounts. Tell your health care provider if you change your alcohol intake. °· Do not use tobacco products, including cigarettes, chewing tobacco, and e-cigarettes. If you need help quitting, ask your health care provider. °· Avoid contact sports. ° °General instructions °· Take over-the-counter and prescription medicines only as told by your health care provider. Anticoagulant medicines can have side effects, including easy bruising and difficulty stopping bleeding. If you are prescribed an anticoagulant, you will also need to do these things: °? Hold pressure over cuts for longer than  usual. °? Tell your dentist and other health care providers that you are taking anticoagulants before you have any procedures in which bleeding may occur. °? Avoid contact sports. °· Wear a medical alert bracelet or carry a medical alert card that says you have had a PE. °· Ask your health care provider how soon you can go back to your normal activities. Stay active to prevent new blood clots from forming. °· Make sure to exercise while traveling or when you have been sitting or standing for a long period of time. It is very important to exercise. Exercise your legs by walking or by tightening and relaxing your leg muscles often. Take frequent walks. °· Wear compression stockings as told by your health care provider to help prevent more blood clots from forming. °· Do not use tobacco products, including cigarettes, chewing tobacco, and e-cigarettes. If you need help quitting, ask your health care provider. °· Keep all follow-up appointments with your health care provider. This is important. °How is this prevented? °Take these actions to decrease your risk of developing another PE: °· Exercise regularly. For at least 30 minutes every day, engage in: °? Activity that involves moving your arms and legs. °? Activity that encourages good blood flow through your body by increasing your heart rate. °· Exercise your arms and legs every hour during long-distance travel (over 4 hours). Drink plenty of water and avoid drinking alcohol while traveling. °· Avoid sitting or lying in bed for long periods of time without moving your legs. °· Maintain a weight that is appropriate for your height. Ask your health care provider what weight is healthy for you. °· If you are a woman who is over 35 years of age, avoid unnecessary use of medicines that contain estrogen. These include birth control pills. °· Do not smoke, especially if you take estrogen medicines. If you need help quitting, ask your health care provider. °· If you are at  very high risk for PE, wear compression stockings. °· If you recently had a PE, have regularly scheduled ultrasound testing on your legs to check for new blood clots. ° °If you are hospitalized, prevention measures may include: °· Early walking after surgery, as soon as your health care provider says that it is safe. °· Receiving anticoagulants to prevent blood clots. If you cannot take anticoagulants, other options may be available, such as wearing compression stockings or using different types of devices. ° °Get help right away if: °· You have new or increased pain, swelling, or redness in an arm or leg. °· You have numbness or tingling in an arm or leg. °· You have shortness of breath while active or at rest. °· You have chest pain. °· You have a rapid or irregular heartbeat. °· You feel light-headed or dizzy. °· You cough up blood. °· You notice blood in your vomit, bowel movement, or   urine.  You have a fever. These symptoms may represent a serious problem that is an emergency. Do not wait to see if the symptoms will go away. Get medical help right away. Call your local emergency services (911 in the [place] Do not drive yourself to the hospital. This information is not intended to replace advice given to you by your health care provider. Make sure you discuss any questions you have with your health care provider. Document Released: 04/24/2000 Document Revised: 10/03/2015 Document Reviewed: 08/22/2014 Elsevier Interactive Patient Education  2017 [place] on my medicine - XARELTO (rivaroxaban)  This medication education was reviewed with me or my healthcare representative as part of my discharge preparation.   WHY WAS XARELTO PRESCRIBED FOR YOU? Xarelto was prescribed to treat blood clots that may have been found in the veins of your legs (deep vein thrombosis) or in your lungs (pulmonary embolism) and to reduce the risk of them occurring again.  What do you need to know about  Xarelto? The starting dose is one 15 mg tablet taken TWICE daily with food for the FIRST 21 DAYS then on (enter date)  11/12/16  the dose is changed to one 20 mg tablet taken ONCE A DAY with your evening meal.  DO NOT stop taking Xarelto without talking to the health care provider who prescribed the medication.  Refill your prescription for 20 mg tablets before you run out.  After discharge, you should have regular check-up appointments with your healthcare provider that is prescribing your Xarelto.  In the future your dose may need to be changed if your kidney function changes by a significant amount.  What do you do if you miss a dose? If you are taking Xarelto TWICE DAILY and you miss a dose, take it as soon as you remember. You may take two 15 mg tablets (total 30 mg) at the same time then resume your regularly scheduled 15 mg twice daily the next day.  If you are taking Xarelto ONCE DAILY and you miss a dose, take it as soon as you remember on the same day then continue your regularly scheduled once daily regimen the next day. Do not take two doses of Xarelto at the same time.   Important Safety Information Xarelto is a blood thinner medicine that can cause bleeding. You should call your healthcare provider right away if you experience any of the following: ? Bleeding from an injury or your nose that does not stop. ? Unusual colored urine (red or dark brown) or unusual colored stools (red or black). ? Unusual bruising for unknown reasons. ? A serious fall or if you hit your head (even if there is no bleeding).  Some medicines may interact with Xarelto and might increase your risk of bleeding while on Xarelto. To help avoid this, consult your healthcare provider or pharmacist prior to using any new prescription or non-prescription medications, including herbals, vitamins, non-steroidal anti-inflammatory drugs (NSAIDs) and supplements.  This website has more information on Xarelto:  [email or web address]

## 2016-10-21 NOTE — Progress Notes (Signed)
ANTICOAGULATION CONSULT NOTE  Pharmacy Consult:  Heparin Indication:  New PE  Allergies  Allergen Reactions  . Amoxicillin Itching and Other (See Comments)    Has patient had a PCN reaction causing immediate rash, facial/tongue/throat swelling, SOB or lightheadedness with hypotension: No Has patient had a PCN reaction causing severe rash involving mucus membranes or skin necrosis: No Has patient had a PCN reaction that required hospitalization No Has patient had a PCN reaction occurring within the last 10 years: No If all of the above answers are "NO", then may proceed with Cephalosporin use.     Patient Measurements: Height: 5\' 9"  (175.3 cm) Weight: 220 lb 10.9 oz (100.1 kg) IBW/kg (Calculated) : 70.7 Heparin Dosing Weight: 92 kg  Vital Signs: Temp: 98.3 F (36.8 C) (06/13 0327) Temp Source: Oral (06/13 0327) BP: 129/71 (06/13 0327) Pulse Rate: 56 (06/13 0327)  Labs:  Recent Labs  10/20/16 1557 10/21/16 0222 10/21/16 1144  HGB 14.7 14.0  --   HCT 45.2 43.0  --   PLT 212 199  --   HEPARINUNFRC  --  >2.20* 0.37  CREATININE 1.05 1.01  --     Estimated Creatinine Clearance: 82.8 mL/min (by C-G formula based on SCr of 1.01 mg/dL).  Assessment: 65 y.o. male with PE for heparin.  Initial heparin level supratherapeutic  Repeat heparin level is now therapeutic at 0.37 on heparin 1100 units/hr. No issues with infusion or bleeding noted.  Goal of Therapy:  Heparin level 0.3-0.7 units/ml Monitor platelets by anticoagulation protocol: Yes   Plan:  Heparin 1100 units/hr 6h confirmatory HL Daily HL/CBC Monitor s/sx of bleeding F/u transition to oral anticoagulation  Andrey Cota. Diona Foley, PharmD, BCPS Clinical Pharmacist [phone] 10/21/2016, 12:42 PM

## 2016-10-21 NOTE — Discharge Summary (Signed)
Physician Discharge Summary  Patient ID: James [name] MRN: [id number] DOB/AGE: 67-Jan-1952 67 y.o.  Admit date: 10/20/2016 Discharge date: 10/22/2016  Admission Diagnoses: Shortness of breath  Discharge Diagnoses:  Active Problems:   Shortness of breath   Pulmonary embolus [facility])   Patient Active Problem List   Diagnosis Date Noted  . Pulmonary embolus ([place]) 10/22/2016  . Pulmonary embolism ([place]) 10/21/2016  . Shortness of breath 10/20/2016  . Adenocarcinoma of right lung, stage 1 ([place]) 09/06/2016  . S/P lobectomy of lung 09/02/2016  . Non-rheumatic mitral regurgitation 07/02/2016  . OA (osteoarthritis) of hip 10/05/2014  . OSA on CPAP 07/02/2014  . Chronic venous insufficiency 07/02/2014  . BPH with obstruction/lower urinary tract symptoms 07/12/2013  . Allergic rhinitis 07/12/2013  . Mass of upper lobe of right lung 10/19/2012  . Routine health maintenance 07/31/2011  . Hypogonadism male 07/29/2011  . Obesity 06/08/2007  . Type II diabetes mellitus with manifestations ([place]) 06/08/2007  . Hyperlipidemia with target LDL less than 130 01/18/2007  . GERD 01/18/2007  . Spinal stenosis of lumbar region 01/18/2007   The patient is a 66 year old gentleman who had a right upper lobectomy for stage I a non-small cell carcinoma on 09/02/2016 by Dr. [name]. He had an uncomplicated postoperative course. He was seen in the office on 09/17/2016 by Dr. [name] and showed some evidence of volume overload. Dr. [name] put him on Lasix at that time and his symptoms improved. Lasix has subsequently been stopped. At home the patient has been checking his oxygen saturation frequently and last Saturday had episode where it dropped to 77% after walking approximately 100 yards. He was associated with shortness of breath. Prior to that he states he could walk pretty well up to 2 and half miles without significant difficulty. He does have some incisional pain. Otherwise he feels pretty well.  After being seen in the office on today's date Dr. [name] felt due to the episode of decreased saturations and increased shortness of breath that he should undergo a CT angioma to rule out pulmonary embolism. The patient does have a history of severe mitral regurgitation by transesophageal echocardiogram done in March 2018.  Discharged Condition: good  Hospital Course: The patient was admitted to the hospital and underwent CT angiogram. The results are shown below and he did rule in for a pulmonary embolism. He has been initially started on heparin and will be transitioned to [place]. He does have some findings of volume overload and has been restarted on diuretic. He has continued to feel overall well. He has walked several times independently and is ready for discharge.   Consults: cardiology  Significant Diagnostic Studies: angiography: CT angio- PE protocol  Treatments: anticoagulation: heparin  Discharge Exam: Blood pressure (!) 152/88, pulse 86, temperature 97.8 F (36.6 C), temperature source Oral, resp. rate 20, height 5\' 9"  (1.753 m), weight 100.1 kg (220 lb 10.9 oz), SpO2 96 %.   General appearance: alert, cooperative and no distress Heart: sinus brady with holosystolic murmur 3/6 Lungs: clear to auscultation bilaterally Abdomen: soft, non-tender; bowel sounds normal; no masses,  no organomegaly Extremities: extremities normal, atraumatic, no cyanosis or edema  Disposition: 01-Home or Self Care  Discharge Instructions    Discharge patient    Complete by:  As directed    Discharge disposition:  01-Home or Self Care   Discharge patient date:  10/22/2016     Allergies as of 10/22/2016      Reactions   Amoxicillin Itching, Other (See Comments)  Has patient had a PCN reaction causing immediate rash, facial/tongue/throat swelling, SOB or lightheadedness with hypotension: No Has patient had a PCN reaction causing severe rash involving mucus membranes or skin necrosis: No Has  patient had a PCN reaction that required hospitalization No Has patient had a PCN reaction occurring within the last 10 years: No If all of the above answers are "NO", then may proceed with Cephalosporin use.      Medication List    STOP taking these medications   naproxen sodium 220 MG tablet Commonly known as:  ANAPROX     TAKE these medications   acetaminophen 325 MG tablet Commonly known as:  TYLENOL Take 650 mg by mouth every 6 (six) hours as needed for moderate pain or headache.   ALLERGY RELIEF EYE DROPS OP Apply 2 drops to eye daily as needed (allergy).   aspirin EC 81 MG tablet Take 1 tablet (81 mg total) by mouth daily.   atorvastatin 20 MG tablet Commonly known as:  LIPITOR Take 1 tablet (20 mg total) by mouth daily.   diphenhydramine-acetaminophen 25-500 MG Tabs tablet Commonly known as:  TYLENOL PM Take 2 tablets by mouth at bedtime as needed (for sleep).   Fish Oil 500 MG Caps Take 500 mg by mouth daily.   FLAXSEED (LINSEED) PO Take 20 mLs by mouth daily. Sprinkled in drink daily   FUNGI-NAIL TOE & FOOT EX Apply 1 application topically daily.   furosemide 40 MG tablet Commonly known as:  LASIX Take 1 tablet (40 mg total) by mouth daily.   multivitamin tablet Take 1 tablet by mouth daily.   potassium chloride SA 20 MEQ tablet Commonly known as:  K-DUR,KLOR-CON Take 1 tablet (20 mEq total) by mouth daily.   psyllium 58.6 % packet Commonly known as:  METAMUCIL Take 1 packet by mouth daily.   Rivaroxaban 15 & 20 MG Tbpk Take as directed on package: Start with one 15mg  tablet by mouth twice a day with food. On Day 22, switch to one 20mg  tablet once a day with food.   rivaroxaban 20 MG Tabs tablet Commonly known as:  XARELTO Take 1 tablet (20 mg total) by mouth daily with supper. Start taking on:  11/20/2016   sodium chloride 0.65 % Soln nasal spray Commonly known as:  OCEAN Place 2 sprays into both nostrils at bedtime.   traMADol 50 MG  tablet Commonly known as:  ULTRAM Take 50 mg by mouth every 4-6 hours PRN severe pain. What changed:  how much to take  how to take this  when to take this  reasons to take this  additional instructions      Dg Chest 2 View  Result Date: 10/20/2016 CLINICAL DATA:  Followup right lobectomy.  Shortness of breath. EXAM: CHEST  2 VIEW COMPARISON:  09/22/2016 FINDINGS: No longer any residual pleural air on the right. Previous lobectomy on the right with elevation of the right hemidiaphragm. Remaining right lung appears clear. Left lung is clear. No effusions. Ordinary degenerative changes affect the spine. IMPRESSION: Previous right lobectomy. No active disease presently. Remaining lung appears clear. Electronically Signed   By: Nelson Chimes M.D.   On: 10/20/2016 14:08   Ct Angio Chest Pe W Or Wo Contrast  Result Date: 10/20/2016 CLINICAL DATA:  Right upper lobectomy with shortness of breath EXAM: CT ANGIOGRAPHY CHEST WITH CONTRAST TECHNIQUE: Multidetector CT imaging of the chest was performed using the standard protocol during bolus administration of intravenous contrast. Multiplanar CT image reconstructions  and MIPs were obtained to evaluate the vascular anatomy. CONTRAST:  65 cc Isovue 370 intravenous COMPARISON:  Radiograph 10/20/2016, CT chest 08/05/2016, 09/12/2012 FINDINGS: Cardiovascular: Satisfactory opacification of the pulmonary arteries to the segmental level. Nonocclusive filling defect within segmental and subsegmental branch of right lower lobe pulmonary artery. No significant elevation of RV LV ratio. Narrowed appearance of left lower lobe pulmonary artery branch vessel with linear defects, noted on previous exam and suggestive of sequela of chronic PE. No definite acute filling defects on the left. Respiratory motion artifact limits evaluation of the left upper and lower lobe pulmonary distal branch vessels. Non aneurysmal aorta. Mild atherosclerosis. Minimal coronary artery  calcification. The heart does not appear enlarged. No significant pericardial effusion. Mediastinum/Nodes: Esophagus within normal limits. No significantly enlarged mediastinal or hilar nodes. Midline trachea. Thyroid is normal. Lungs/Pleura: Status post right upper lobectomy. Small right-sided pleural effusion. No pneumothorax. Right lower lobe pulmonary nodule measuring 8 mm, series 7, image number 39, felt to correspond to a previously stable right lower lobe pulmonary nodule with differences in positioning likely secondary to new postsurgical changes. Upper Abdomen: Stable 4.7 cm cyst in the posterior right hepatic lobe, and a 2 cm cyst in the left hepatic lobe. Musculoskeletal: Degenerative changes of the spine. No acute or suspicious bone lesions. Review of the MIP images confirms the above findings. IMPRESSION: 1. Positive for small segmental and subsegmental right lower lobe pulmonary embolus. No elevation of the RV LV ratio. Findings suggestive of sequela of chronic PE in a left lower lobe pulmonary artery, noted on prior study. 2. Status post right upper lobectomy. Small right-sided pleural effusion. Right lower lobe 8 mm pulmonary nodule, thought to correspond to a previously stable 4 mm with slight differences in location thought related to interval postsurgical change. CT follow-up in 3-6 months may be considered to assess for further interval growth. 3. Stable hepatic cysts Critical Value/emergent results were called by telephone at the time of interpretation on 10/20/2016 at 6:39 pm to Dr. Dr. Darcey Nora, who verbally acknowledged these results. Aortic Atherosclerosis (ICD10-I70.0). Electronically Signed   By: Donavan Foil M.D.   On: 10/20/2016 18:39     Follow-up Information    Melrose Nakayama, MD Follow up.   Specialty:  Cardiothoracic Surgery Why:  Your appointment is on 11/17/2016 at 12:30pm. Please arrive at 12:00pm for a chest xray at [facility] which is located on the first  floor of our building.  Contact information: [address] [place] [address] [phone]        [name], MD. Call in 1 day(s).   Specialty:  Internal Medicine Contact information: [address] [address] [phone]           Signed: Elgie Collard 10/22/2016, 1:29 PM

## 2016-10-21 NOTE — Progress Notes (Signed)
ANTICOAGULATION CONSULT NOTE  Pharmacy Consult:  Heparin Indication:  New PE  Allergies  Allergen Reactions  . Amoxicillin Itching and Other (See Comments)    Has patient had a PCN reaction causing immediate rash, facial/tongue/throat swelling, SOB or lightheadedness with hypotension: No Has patient had a PCN reaction causing severe rash involving mucus membranes or skin necrosis: No Has patient had a PCN reaction that required hospitalization No Has patient had a PCN reaction occurring within the last 10 years: No If all of the above answers are "NO", then may proceed with Cephalosporin use.     Patient Measurements: Height: 5\' 9"  (175.3 cm) Weight: 220 lb 10.9 oz (100.1 kg) IBW/kg (Calculated) : 70.7 Heparin Dosing Weight: 92 kg  Vital Signs: Temp: 98.7 F (37.1 C) (06/12 2001) Temp Source: Oral (06/12 2001) BP: 155/77 (06/12 2001) Pulse Rate: 72 (06/12 2001)  Labs:  Recent Labs  10/20/16 1557 10/21/16 0222  HGB 14.7 14.0  HCT 45.2 43.0  PLT 212 199  HEPARINUNFRC  --  >2.20*  CREATININE 1.05 1.01    Estimated Creatinine Clearance: 82.8 mL/min (by C-G formula based on SCr of 1.01 mg/dL).  Assessment: 66 y.o. male with PE for heparin.  Initial heparin level supratherapeutic  Goal of Therapy:  Heparin level 0.3-0.7 units/ml Monitor platelets by anticoagulation protocol: Yes   Plan:  Hold heparin x 1 hour, then decrease heparin 1100 units/hr Check heparin level in 6 hours.   Phillis Knack, PharmD, BCPS 10/21/2016, 3:24 AM

## 2016-10-22 ENCOUNTER — Ambulatory Visit (HOSPITAL_COMMUNITY): Payer: Medicare Other

## 2016-10-22 DIAGNOSIS — I2699 Other pulmonary embolism without acute cor pulmonale: Secondary | ICD-10-CM

## 2016-10-22 LAB — CBC
HCT: 45.7 % (ref 39.0–52.0)
Hemoglobin: 14.6 g/dL (ref 13.0–17.0)
MCH: 28.9 pg (ref 26.0–34.0)
MCHC: 31.9 g/dL (ref 30.0–36.0)
MCV: 90.3 fL (ref 78.0–100.0)
Platelets: 204 10*3/uL (ref 150–400)
RBC: 5.06 MIL/uL (ref 4.22–5.81)
RDW: 13.9 % (ref 11.5–15.5)
WBC: 6.7 10*3/uL (ref 4.0–10.5)

## 2016-10-22 MED ORDER — RIVAROXABAN (XARELTO) VTE STARTER PACK (15 & 20 MG): ORAL_TABLET | ORAL | 0 refills | Status: DC

## 2016-10-22 MED ORDER — RIVAROXABAN 20 MG PO TABS: 20.0000 mg | ORAL_TABLET | Freq: Every day | ORAL | 1 refills | Status: DC

## 2016-10-22 MED ORDER — POTASSIUM CHLORIDE CRYS ER 20 MEQ PO TBCR: 20.0000 meq | EXTENDED_RELEASE_TABLET | Freq: Every day | ORAL | 1 refills | Status: DC

## 2016-10-22 NOTE — Progress Notes (Signed)
10/22/2016 2:30 PM Discharge AVS meds taken today and those due this evening reviewed.  Follow-up appointments and when to call md reviewed.  D/C IV and TELE.  Questions and concerns addressed.   D/C home per orders. [place]

## 2016-10-22 NOTE — Care Management Obs Status (Signed)
[place] NOTIFICATION   Patient Details  Name: James [name] MRN: [id number] Date of Birth: Dec 20, 1950   Medicare Observation Status Notification Given:  Yes    Dawayne Patricia, RN 10/22/2016, 11:36 AM

## 2016-10-22 NOTE — Care Management Note (Signed)
Case Management Note Marvetta Gibbons RN, BSN Unit 2W-Case Manager [phone]  Patient Details  Name: KATRELL MILHORN MRN: [id number] Date of Birth: 1951/02/07  Subjective/Objective:   Pt presented with PE                 Action/Plan: PTA pt lived at home with wife- pt has been started on Xarelto- per insurance check - copay $35- spoke with pt and wife at bedside- per pt [facility] [address]- call made to pharmacy they do not have starter pack in stock- discussed with pt where to go fill starter pack- [facility] [address]- 30 day free card given to pt to use on discharge- (pt has been on Xarelto in past about 2 years ago- but did not use 30 day free card)-   Expected Discharge Date:    10/22/16              Expected Discharge Plan:  Home/Self Care  In-House Referral:     Discharge planning Services  CM Consult, Medication Assistance  Post Acute Care Choice:  NA Choice offered to:  NA  DME Arranged:    DME Agency:     HH Arranged:    [place] Agency:     Status of Service:  Completed, signed off  If discussed at [employer], dates discussed:    Additional Comments:  Dawayne Patricia, RN 10/22/2016, 12:28 PM

## 2016-10-22 NOTE — Care Management CC44 (Signed)
Condition Code 44 Documentation Completed  Patient Details  Name: CRESENCIO REESOR MRN: [id number] Date of Birth: 1951/01/21   Condition Code 44 given:  Yes Patient signature on Condition Code 44 notice:  Yes Documentation of 2 MD's agreement:  Yes Code 44 added to claim:  Yes    Dawayne Patricia, RN 10/22/2016, 11:36 AM

## 2016-10-22 NOTE — Progress Notes (Addendum)
      [address]       [place] [address]             [phone]         Subjective: Feels good and walked a 3 miles yesterday per the patient.    Objective: Vital signs in last 24 hours: Temp:  [97.7 F (36.5 C)-98 F (36.7 C)] 97.7 F (36.5 C) (06/14 0331) Pulse Rate:  [55-72] 55 (06/14 0331) Cardiac Rhythm: Normal sinus rhythm (06/13 2000) Resp:  [19-21] 19 (06/14 0331) BP: (122-141)/(72-78) 122/72 (06/14 0331) SpO2:  [96 %-97 %] 97 % (06/14 0331)     Intake/Output from previous day: 06/13 0701 - 06/14 0700 In: 600 [P.O.:600] Out: -  Intake/Output this shift: No intake/output data recorded.  General appearance: alert, cooperative and no distress Heart: sinus brady with holosystolic murmur 3/6 Lungs: clear to auscultation bilaterally Abdomen: soft, non-tender; bowel sounds normal; no masses,  no organomegaly Extremities: extremities normal, atraumatic, no cyanosis or edema  Lab Results:  Recent Labs  10/21/16 0222 10/22/16 0308  WBC 6.4 6.7  HGB 14.0 14.6  HCT 43.0 45.7  PLT 199 204   BMET:  Recent Labs  10/20/16 1557 10/21/16 0222  NA 140 138  K 4.2 3.9  CL 108 107  CO2 24 22  GLUCOSE 102* 99  BUN 17 15  CREATININE 1.05 1.01  CALCIUM 9.3 8.6*    PT/INR: No results for input(s): LABPROT, INR in the last 72 hours. ABG    Component Value Date/Time   PHART 7.428 08/31/2016 1119   HCO3 22.7 08/31/2016 1119   TCO2 25 08/11/2016 0854   ACIDBASEDEF 1.1 08/31/2016 1119   O2SAT 97.3 08/31/2016 1119   CBG (last 3)  No results for input(s): GLUCAP in the last 72 hours.  Assessment/Plan:  1. CV- Sinus bradycardia this morning. BP stable.  2. Pulm- CTA showed small segmental and subsegmental RLL pulmonary embolus. On Xarelto 15mg  BID for 21 days then will change to Xarelto 20mg  once a day.  3. H and H okay. Platelets okay 4. Back on Lasix for fluid overload.   Plan: Work on ambulation. Continue medical therapy. Possibly home  today.     LOS: 2 days    Elgie Collard 10/22/2016 Patient seen and examined, agree with above He remains extraordinarily anxious. Ambulating without desaturating Home today on Xarelto Will leave on lasix for now  [place]. [name], MD [facility] [phone]

## 2016-10-22 NOTE — Progress Notes (Signed)
Per insurance check for Xarelto # 2.  S/W JAMIE @ [facility] # [phone]   1. XARELTO  20 MG  DAILY   COVER- YES  CO-PAY- $ 35.00  TIER- 2 DRUG  PRIOR APPROVAL- NO   PHARMACY [facility]

## 2016-10-23 ENCOUNTER — Telehealth: Payer: Self-pay | Admitting: *Deleted

## 2016-10-26 ENCOUNTER — Telehealth: Payer: Self-pay | Admitting: *Deleted

## 2016-10-26 NOTE — Telephone Encounter (Signed)
A user error has taken place.Marland Kitchen Open by mistake...James [name]

## 2016-10-26 NOTE — Telephone Encounter (Signed)
Called pt on Friday to set-up TCM hosp f/u appt. Per chart pt has called back and schedule appt for 6/19. Called pt needed to complete TCM call spoke w/wife competed call below.../lmb  Transition Care Management Follow-up Telephone Call   Date discharged? 10/22/16   How have you been since you were released from the hospital? Wife states he seem to be doing alright   Do you understand why you were in the hospital? YES   Do you understand the discharge instructions? YES   Where were you discharged to? Home   Items Reviewed:  Medications reviewed: YES  Allergies reviewed: YES  Dietary changes reviewed: YES  Referrals reviewed YES   Functional Questionnaire:   Activities of Daily Living (ADLs):   She states he are independent in the following: bathing and hygiene, feeding, continence, grooming, toileting and dressing States he require assistance with the following: ambulation   Any transportation issues/concerns?: YES   Any patient concerns? NO   Confirmed importance and date/time of follow-up visits scheduled YES, appt 10/27/16  Provider Appointment booked with Dr. Ronnald Ramp  Confirmed with patient if condition begins to worsen call PCP or go to the ER.  Patient was given the office number and encouraged to call back with question or concerns.  : YES

## 2016-10-27 ENCOUNTER — Encounter: Payer: Self-pay | Admitting: Internal Medicine

## 2016-10-27 ENCOUNTER — Ambulatory Visit (INDEPENDENT_AMBULATORY_CARE_PROVIDER_SITE_OTHER): Payer: Medicare Other | Admitting: Internal Medicine

## 2016-10-27 VITALS — BP 126/78 | HR 76 | Temp 98.3°F | Resp 16 | Ht 69.0 in | Wt 221.0 lb

## 2016-10-27 DIAGNOSIS — E118 Type 2 diabetes mellitus with unspecified complications: Secondary | ICD-10-CM | POA: Diagnosis not present

## 2016-10-27 DIAGNOSIS — I2699 Other pulmonary embolism without acute cor pulmonale: Secondary | ICD-10-CM

## 2016-10-27 DIAGNOSIS — F5101 Primary insomnia: Secondary | ICD-10-CM | POA: Insufficient documentation

## 2016-10-27 MED ORDER — SUVOREXANT 15 MG PO TABS: 1.0000 | ORAL_TABLET | Freq: Every day | ORAL | 5 refills | Status: DC | PRN

## 2016-10-27 NOTE — Progress Notes (Signed)
Subjective:  Patient ID: James [name], male    DOB: 25-Jun-1950  Age: 66 y.o. MRN: [id number]  CC: Diabetes   HPI James [name] presents for f/up - He complains of chronic insomnia and has been trying to treat with Benadryl but the Benadryl causes a next a hangover. He wants to try different option. Since I last saw him he has undergone right upper lobe pneumonectomy for adenocarcinoma. His recovery was recently complicated by pulmonary embolus. He tells me the shortness of breath is getting better and he tells me he walked 2 miles one day prior to this visit with minimal shortness of breath.  Outpatient Medications Prior to Visit  Medication Sig Dispense Refill  . acetaminophen (TYLENOL) 325 MG tablet Take 650 mg by mouth every 6 (six) hours as needed for moderate pain or headache.    Marland Kitchen aspirin EC 81 MG tablet Take 1 tablet (81 mg total) by mouth daily. 90 tablet 3  . atorvastatin (LIPITOR) 20 MG tablet Take 1 tablet (20 mg total) by mouth daily. 90 tablet 3  . FLAXSEED, LINSEED, PO Take 20 mLs by mouth daily. Sprinkled in drink daily    . furosemide (LASIX) 40 MG tablet Take 1 tablet (40 mg total) by mouth daily. 30 tablet 0  . Multiple Vitamin (MULTIVITAMIN) tablet Take 1 tablet by mouth daily.    . Omega-3 Fatty Acids (FISH OIL) 500 MG CAPS Take 500 mg by mouth daily.     . potassium chloride SA (K-DUR,KLOR-CON) 20 MEQ tablet Take 1 tablet (20 mEq total) by mouth daily. 30 tablet 1  . psyllium (METAMUCIL) 58.6 % packet Take 1 packet by mouth daily.    Derrill Memo ON 11/20/2016] rivaroxaban (XARELTO) 20 MG TABS tablet Take 1 tablet (20 mg total) by mouth daily with supper. 30 tablet 1  . Rivaroxaban 15 & 20 MG TBPK Take as directed on package: Start with one 15mg  tablet by mouth twice a day with food. On Day 22, switch to one 20mg  tablet once a day with food. 51 each 0  . sodium chloride (OCEAN) 0.65 % SOLN nasal spray Place 2 sprays into both nostrils at bedtime.    Bethann Humble  Sulfate (ALLERGY RELIEF EYE DROPS OP) Apply 2 drops to eye daily as needed (allergy).    . traMADol (ULTRAM) 50 MG tablet Take 50 mg by mouth every 4-6 hours PRN severe pain. (Patient taking differently: Take 50 mg by mouth every 4 (four) hours as needed for moderate pain. ) 30 tablet 0  . Undecylenic Ac-Zn Undecylenate (FUNGI-NAIL TOE & FOOT EX) Apply 1 application topically daily.    . diphenhydramine-acetaminophen (TYLENOL PM) 25-500 MG TABS tablet Take 2 tablets by mouth at bedtime as needed (for sleep).     No facility-administered medications prior to visit.     ROS Review of Systems  Constitutional: Negative.  Negative for appetite change, diaphoresis, fatigue and unexpected weight change.  HENT: Negative.   Eyes: Negative for visual disturbance.  Respiratory: Positive for shortness of breath. Negative for cough, chest tightness, wheezing and stridor.   Cardiovascular: Negative for chest pain, palpitations and leg swelling.  Gastrointestinal: Negative for abdominal pain, constipation, diarrhea, nausea and vomiting.  Endocrine: Negative for polydipsia, polyphagia and polyuria.  Genitourinary: Negative.  Negative for difficulty urinating.  Musculoskeletal: Negative.  Negative for back pain and myalgias.  Skin: Negative for color change and rash.  Neurological: Negative.  Negative for dizziness, syncope and weakness.  Hematological: Negative.  Psychiatric/Behavioral: Negative.     Objective:  BP 126/78 (BP Location: Left Arm, Patient Position: Sitting, Cuff Size: Large)   Pulse 76   Temp 98.3 F (36.8 C) (Oral)   Resp 16   Ht 5\' 9"  (1.753 m)   Wt 221 lb (100.2 kg)   SpO2 98%   BMI 32.64 kg/m   BP Readings from Last 3 Encounters:  10/27/16 126/78  10/22/16 (!) 152/88  10/20/16 140/80    Wt Readings from Last 3 Encounters:  10/27/16 221 lb (100.2 kg)  10/20/16 220 lb 10.9 oz (100.1 kg)  10/20/16 217 lb (98.4 kg)    Physical Exam  Constitutional: He is oriented to  person, place, and time. He appears well-nourished. No distress.  HENT:  Mouth/Throat: Oropharynx is clear and moist. No oropharyngeal exudate.  Eyes: Conjunctivae are normal. Right eye exhibits no discharge. No scleral icterus.  Neck: Normal range of motion. Neck supple. No JVD present. No thyromegaly present.  Cardiovascular: Normal rate and regular rhythm.  Exam reveals no gallop (1/6 SEM LLSB) and no friction rub.   Murmur heard. Pulmonary/Chest: Effort normal. No accessory muscle usage. No tachypnea. No respiratory distress. He has decreased breath sounds in the right lower field. He has no wheezes. He has no rhonchi. He has no rales. He exhibits no tenderness.  Abdominal: Soft. Bowel sounds are normal. He exhibits no distension and no mass. There is no tenderness. There is no rebound and no guarding.  Musculoskeletal: Normal range of motion. He exhibits no edema or tenderness.  Lymphadenopathy:    He has no cervical adenopathy.  Neurological: He is alert and oriented to person, place, and time.  Skin: Skin is warm and dry. No rash noted. He is not diaphoretic. No erythema. No pallor.  Psychiatric: He has a normal mood and affect. His behavior is normal. Judgment and thought content normal.  Vitals reviewed.   Lab Results  Component Value Date   WBC 6.7 10/22/2016   HGB 14.6 10/22/2016   HCT 45.7 10/22/2016   PLT 204 10/22/2016   GLUCOSE 99 10/21/2016   CHOL 245 (H) 06/29/2016   TRIG 198.0 (H) 06/29/2016   HDL 35.90 (L) 06/29/2016   LDLDIRECT 177.0 07/24/2015   LDLCALC 170 (H) 06/29/2016   ALT 24 10/01/2016   AST 20 10/01/2016   NA 138 10/21/2016   K 3.9 10/21/2016   CL 107 10/21/2016   CREATININE 1.01 10/21/2016   BUN 15 10/21/2016   CO2 22 10/21/2016   TSH 2.14 07/27/2016   PSA 0.83 07/27/2016   INR 0.97 08/31/2016   HGBA1C 6.0 (H) 08/31/2016   MICROALBUR <0.7 06/29/2016    Dg Chest 2 View  Result Date: 10/20/2016 CLINICAL DATA:  Followup right lobectomy.   Shortness of breath. EXAM: CHEST  2 VIEW COMPARISON:  09/22/2016 FINDINGS: No longer any residual pleural air on the right. Previous lobectomy on the right with elevation of the right hemidiaphragm. Remaining right lung appears clear. Left lung is clear. No effusions. Ordinary degenerative changes affect the spine. IMPRESSION: Previous right lobectomy. No active disease presently. Remaining lung appears clear. Electronically Signed   By: Nelson Chimes M.D.   On: 10/20/2016 14:08   Ct Angio Chest Pe W Or Wo Contrast  Result Date: 10/20/2016 CLINICAL DATA:  Right upper lobectomy with shortness of breath EXAM: CT ANGIOGRAPHY CHEST WITH CONTRAST TECHNIQUE: Multidetector CT imaging of the chest was performed using the standard protocol during bolus administration of intravenous contrast. Multiplanar CT image reconstructions  and MIPs were obtained to evaluate the vascular anatomy. CONTRAST:  65 cc Isovue 370 intravenous COMPARISON:  Radiograph 10/20/2016, CT chest 08/05/2016, 09/12/2012 FINDINGS: Cardiovascular: Satisfactory opacification of the pulmonary arteries to the segmental level. Nonocclusive filling defect within segmental and subsegmental branch of right lower lobe pulmonary artery. No significant elevation of RV LV ratio. Narrowed appearance of left lower lobe pulmonary artery branch vessel with linear defects, noted on previous exam and suggestive of sequela of chronic PE. No definite acute filling defects on the left. Respiratory motion artifact limits evaluation of the left upper and lower lobe pulmonary distal branch vessels. Non aneurysmal aorta. Mild atherosclerosis. Minimal coronary artery calcification. The heart does not appear enlarged. No significant pericardial effusion. Mediastinum/Nodes: Esophagus within normal limits. No significantly enlarged mediastinal or hilar nodes. Midline trachea. Thyroid is normal. Lungs/Pleura: Status post right upper lobectomy. Small right-sided pleural effusion. No  pneumothorax. Right lower lobe pulmonary nodule measuring 8 mm, series 7, image number 39, felt to correspond to a previously stable right lower lobe pulmonary nodule with differences in positioning likely secondary to new postsurgical changes. Upper Abdomen: Stable 4.7 cm cyst in the posterior right hepatic lobe, and a 2 cm cyst in the left hepatic lobe. Musculoskeletal: Degenerative changes of the spine. No acute or suspicious bone lesions. Review of the MIP images confirms the above findings. IMPRESSION: 1. Positive for small segmental and subsegmental right lower lobe pulmonary embolus. No elevation of the RV LV ratio. Findings suggestive of sequela of chronic PE in a left lower lobe pulmonary artery, noted on prior study. 2. Status post right upper lobectomy. Small right-sided pleural effusion. Right lower lobe 8 mm pulmonary nodule, thought to correspond to a previously stable 4 mm with slight differences in location thought related to interval postsurgical change. CT follow-up in 3-6 months may be considered to assess for further interval growth. 3. Stable hepatic cysts Critical Value/emergent results were called by telephone at the time of interpretation on 10/20/2016 at 6:39 pm to Dr. Dr. Darcey Nora, who verbally acknowledged these results. Aortic Atherosclerosis (ICD10-I70.0). Electronically Signed   By: Donavan Foil M.D.   On: 10/20/2016 18:39    Assessment & Plan:   Al was seen today for diabetes.  Diagnoses and all orders for this visit:  Primary insomnia -     Suvorexant (BELSOMRA) 15 MG TABS; Take 1 tablet by mouth daily as needed.  Type 2 diabetes mellitus with complication, without long-term current use of insulin ([place])- his recent A1c was 6.0%, his blood sugars are adequately well-controlled.  Other acute pulmonary embolism without acute cor pulmonale ([place])- clinically he is improving, will continue anticoagulation for the next 6 months.   I have discontinued Mr. Reesman  diphenhydramine-acetaminophen. I am also having him start on Suvorexant. Additionally, I am having him maintain his Fish Oil, multivitamin, atorvastatin, aspirin EC, psyllium, (FLAXSEED, LINSEED, PO), sodium chloride, Undecylenic Ac-Zn Undecylenate (FUNGI-NAIL TOE & FOOT EX), Tetrahydrozoline-Zn Sulfate (ALLERGY RELIEF EYE DROPS OP), traMADol, furosemide, acetaminophen, potassium chloride SA, Rivaroxaban, and rivaroxaban.  Meds ordered this encounter  Medications  . Suvorexant (BELSOMRA) 15 MG TABS    Sig: Take 1 tablet by mouth daily as needed.    Dispense:  30 tablet    Refill:  5     Follow-up: Return in about 3 months (around 01/27/2017).  [name], MD

## 2016-10-27 NOTE — Patient Instructions (Signed)

## 2016-10-29 ENCOUNTER — Encounter (HOSPITAL_COMMUNITY)
Admission: RE | Admit: 2016-10-29 | Discharge: 2016-10-29 | Disposition: A | Discharge status: Home / Self Care | Payer: Medicare Other | Source: Ambulatory Visit | Attending: Thoracic Surgery (Cardiothoracic Vascular Surgery) | Admitting: Thoracic Surgery (Cardiothoracic Vascular Surgery)

## 2016-10-29 ENCOUNTER — Encounter (HOSPITAL_COMMUNITY): Payer: Medicare Other

## 2016-10-29 DIAGNOSIS — C3491 Malignant neoplasm of unspecified part of right bronchus or lung: Secondary | ICD-10-CM

## 2016-10-29 NOTE — Progress Notes (Signed)
Pulmonary Individual Treatment Plan  Patient Details  Name: James [name] MRN: [id number] Date of Birth: 02/09/1951 Referring Provider:     Pulmonary Rehab Walk Test from 10/29/2016 in [place]  Referring Provider  Dr. [name]      Initial Encounter Date:    Pulmonary Rehab Walk Test from 10/29/2016 in [place]  Date  10/29/16  Referring Provider  Dr. [name]      Visit Diagnosis: Adenocarcinoma of right lung [facility])  Patient's Home Medications on Admission:   Current Outpatient Prescriptions:  .  acetaminophen (TYLENOL) 325 MG tablet, Take 650 mg by mouth every 6 (six) hours as needed for moderate pain or headache., Disp: , Rfl:  .  aspirin EC 81 MG tablet, Take 1 tablet (81 mg total) by mouth daily., Disp: 90 tablet, Rfl: 3 .  atorvastatin (LIPITOR) 20 MG tablet, Take 1 tablet (20 mg total) by mouth daily., Disp: 90 tablet, Rfl: 3 .  FLAXSEED, LINSEED, PO, Take 20 mLs by mouth daily. Sprinkled in drink daily, Disp: , Rfl:  .  furosemide (LASIX) 40 MG tablet, Take 1 tablet (40 mg total) by mouth daily., Disp: 30 tablet, Rfl: 0 .  Multiple Vitamin (MULTIVITAMIN) tablet, Take 1 tablet by mouth daily., Disp: , Rfl:  .  Omega-3 Fatty Acids (FISH OIL) 500 MG CAPS, Take 500 mg by mouth daily. , Disp: , Rfl:  .  potassium chloride SA (K-DUR,KLOR-CON) 20 MEQ tablet, Take 1 tablet (20 mEq total) by mouth daily., Disp: 30 tablet, Rfl: 1 .  psyllium (METAMUCIL) 58.6 % packet, Take 1 packet by mouth daily., Disp: , Rfl:  .  [START ON 11/20/2016] rivaroxaban (XARELTO) 20 MG TABS tablet, Take 1 tablet (20 mg total) by mouth daily with supper., Disp: 30 tablet, Rfl: 1 .  Rivaroxaban 15 & 20 MG TBPK, Take as directed on package: Start with one 15mg  tablet by mouth twice a day with food. On Day 22, switch to one 20mg  tablet once a day with food., Disp: 51 each, Rfl: 0 .  sodium chloride (OCEAN) 0.65 % SOLN nasal spray, Place 2  sprays into both nostrils at bedtime., Disp: , Rfl:  .  Suvorexant (BELSOMRA) 15 MG TABS, Take 1 tablet by mouth daily as needed., Disp: 30 tablet, Rfl: 5 .  Tetrahydrozoline-Zn Sulfate (ALLERGY RELIEF EYE DROPS OP), Apply 2 drops to eye daily as needed (allergy)., Disp: , Rfl:  .  traMADol (ULTRAM) 50 MG tablet, Take 50 mg by mouth every 4-6 hours PRN severe pain. (Patient taking differently: Take 50 mg by mouth every 4 (four) hours as needed for moderate pain. ), Disp: 30 tablet, Rfl: 0 .  Undecylenic Ac-Zn Undecylenate (FUNGI-NAIL TOE & FOOT EX), Apply 1 application topically daily., Disp: , Rfl:   Past Medical History: Past Medical History:  Diagnosis Date  . Adenocarcinoma of right lung, stage 1 ([place]) 09/06/2016  . Anxiety   . Arthritis    "knees, hips, back" (10/19/2012)  . Chronic lower back pain   . Colon polyps    10/27/2002, repeat letter 09/17/2007  . Coronary artery disease   . Depressive disorder, not elsewhere classified   . Diabetes mellitus without complication (HCC)    diet controlled  . Fasting hyperglycemia   . GERD (gastroesophageal reflux disease)   . Heart murmur   . Hemoptysis    abnormal CT Chest 01/29/10 - ? new GG changes RUL > not viz on plain cxr 02/26/2010  .  Mitral regurgitation    severe MR 08/2016  . MPN (myeloproliferative neoplasm) ([place])    1st detected 06/04/1998  . Obesity   . OSA on CPAP    last sleep study 10 years ago  . Other and unspecified hyperlipidemia   . Positive PPD 1965   "non reactive in 2012" (10/19/2012)  . Routine general medical examination at a health care facility   . Special screening for malignant neoplasm of prostate   . Spinal stenosis, unspecified region other than cervical   . Wrist pain, left     Tobacco Use: History  Smoking Status  . Never Smoker  Smokeless Tobacco  . Never Used    Labs: Recent Review Flowsheet Data    Labs for ITP Cardiac and Pulmonary Rehab Latest Ref Rng & Units 07/24/2015 06/29/2016 08/11/2016  08/11/2016 08/31/2016   Cholestrol 0 - 200 mg/dL 246(H) 245(H) - - -   LDLCALC 0 - 99 mg/dL - 170(H) - - -   LDLDIRECT mg/dL 177.0 - - - -   HDL >39.00 mg/dL 40.30 35.90(L) - - -   Trlycerides 0.0 - 149.0 mg/dL 211.0(H) 198.0(H) - - -   Hemoglobin A1c 4.8 - 5.6 % 6.5 6.4 - - 6.0(H)   PHART 7.350 - 7.450 - - - 7.305(L) 7.428   PCO2ART 32.0 - 48.0 mmHg - - - 46.9 35.0   HCO3 20.0 - 28.0 mmol/L - - 23.8 23.3 22.7   TCO2 0 - 100 mmol/L - - 25 25 -   ACIDBASEDEF 0.0 - 2.0 mmol/L - - 2.0 3.0(H) 1.1   O2SAT % - - 75.0 97.0 97.3      Capillary Blood Glucose: Lab Results  Component Value Date   GLUCAP 110 (H) 09/07/2016   GLUCAP 105 (H) 09/06/2016   GLUCAP 124 (H) 09/06/2016   GLUCAP 123 (H) 09/06/2016   GLUCAP 126 (H) 09/05/2016     ADL UCSD:   Pulmonary Function Assessment:     Pulmonary Function Assessment - 10/19/16 1052      Breath   Bilateral Breath Sounds Clear   Shortness of Breath Yes      Exercise Target Goals: Date: 10/29/16  Exercise Program Goal: Individual exercise prescription set with THRR, safety & activity barriers. Participant demonstrates ability to understand and report RPE using BORG scale, to self-measure pulse accurately, and to acknowledge the importance of the exercise prescription.  Exercise Prescription Goal: Starting with aerobic activity 30 plus minutes a day, 3 days per week for initial exercise prescription. Provide home exercise prescription and guidelines that participant acknowledges understanding prior to discharge.  Activity Barriers & Risk Stratification:   6 Minute Walk:     6 Minute Walk    Row Name 10/29/16 1626         6 Minute Walk   Phase Discharge     Distance 1547 feet     Walk Time 6 minutes     # of Rest Breaks 0     MPH 2.92     METS 3.22     RPE 15     Perceived Dyspnea  4     Symptoms No     Resting HR 87 bpm     Resting BP 155/83     Max Ex. HR 149 bpm     Max Ex. BP 182/84     2 Minute Post BP 159/84        Interval HR   Baseline HR 87  1 Minute HR 112     2 Minute HR 127     3 Minute HR 130     4 Minute HR 134     5 Minute HR 134     6 Minute HR 149     2 Minute Post HR 102     Interval Heart Rate? Yes       Interval Oxygen   Interval Oxygen? Yes     Baseline Oxygen Saturation % 96 %     Baseline Liters of Oxygen 0 L     1 Minute Oxygen Saturation % 95 %     1 Minute Liters of Oxygen 0 L     2 Minute Oxygen Saturation % 91 %     2 Minute Liters of Oxygen 0 L     3 Minute Oxygen Saturation % 89 %     3 Minute Liters of Oxygen 0 L     4 Minute Oxygen Saturation % 89 %     4 Minute Liters of Oxygen 0 L     5 Minute Oxygen Saturation % 88 %     5 Minute Liters of Oxygen 0 L     6 Minute Oxygen Saturation % 86 %     6 Minute Liters of Oxygen 0 L     2 Minute Post Oxygen Saturation % 96 %     2 Minute Post Liters of Oxygen 0 L        Oxygen Initial Assessment:     Oxygen Initial Assessment - 10/29/16 1631      Initial 6 min Walk   Oxygen Used None   Resting Oxygen Saturation  during 6 min walk 96 %   Exercise Oxygen Saturation  during 6 min walk 86 %  will monitor-patient ran at the end of 59mwt     Program Oxygen Prescription   Program Oxygen Prescription None      Oxygen Re-Evaluation:   Oxygen Discharge (Final Oxygen Re-Evaluation):   Initial Exercise Prescription:     Initial Exercise Prescription - 10/29/16 1600      Date of Initial Exercise RX and Referring Provider   Date 10/29/16   Referring Provider Dr. Reginold Agent   Level 2   Minutes 17     NuStep   Level 2   Minutes 17   METs 1.5     Track   Laps 10   Minutes 17     Prescription Details   Frequency (times per week) 1   Duration Progress to 45 minutes of aerobic exercise without signs/symptoms of physical distress     Intensity   THRR 40-80% of Max Heartrate 61-122   Ratings of Perceived Exertion 11-13   Perceived Dyspnea 0-4     Progression   Progression  Continue progressive overload as per policy without signs/symptoms or physical distress.     Resistance Training   Training Prescription Yes   Weight blue bands   Reps 10-15      Perform Capillary Blood Glucose checks as needed.  Exercise Prescription Changes:   Exercise Comments:   Exercise Goals and Review:   Exercise Goals Re-Evaluation :   Discharge Exercise Prescription (Final Exercise Prescription Changes):   Nutrition:  Target Goals: Understanding of nutrition guidelines, daily intake of sodium 1500mg , cholesterol 200mg , calories 30% from fat and 7% or less from saturated fats, daily to have 5 or more servings of fruits and vegetables.  Biometrics:  Nutrition Therapy Plan and Nutrition Goals:   Nutrition Discharge: Rate Your Plate Scores:   Nutrition Goals Re-Evaluation:   Nutrition Goals Discharge (Final Nutrition Goals Re-Evaluation):   Psychosocial: Target Goals: Acknowledge presence or absence of significant depression and/or stress, maximize coping skills, provide positive support system. Participant is able to verbalize types and ability to use techniques and skills needed for reducing stress and depression.  Initial Review & Psychosocial Screening:     Initial Psych Review & Screening - 10/19/16 1057      Initial Review   Current issues with --  DEpressed after lung surgery d/t inability to do his normal activities     [place]? Yes     Barriers   Psychosocial barriers to participate in program There are no identifiable barriers or psychosocial needs.     Screening Interventions   Interventions Encouraged to exercise      Quality of Life Scores:   PHQ-9: Recent Review Flowsheet Data    Depression screen [facility] 2/9 10/19/2016 07/29/2016 07/27/2016 07/28/2015 07/24/2015   Decreased Interest 1 0 0 0 0   Down, Depressed, Hopeless 3 0 0 0 0   PHQ - 2 Score 4 0 0 0 0   Altered sleeping 3 - - - -   Tired,  decreased energy 3 - - - -   Change in appetite 3 - - - -   Feeling bad or failure about yourself  0 - - - -   Trouble concentrating 0 - - - -   Moving slowly or fidgety/restless 0 - - - -   Suicidal thoughts 0 - - - -   PHQ-9 Score 13 - - - -   Difficult doing work/chores Not difficult at all - - - -     Interpretation of Total Score  Total Score Depression Severity:  1-4 = Minimal depression, 5-9 = Mild depression, 10-14 = Moderate depression, 15-19 = Moderately severe depression, 20-27 = Severe depression   Psychosocial Evaluation and Intervention:     Psychosocial Evaluation - 10/19/16 1058      Psychosocial Evaluation & Interventions   Interventions Encouraged to exercise with the program and follow exercise prescription   Comments He is hoping getting his strength and stamina back will reduce his stress and get back to living a normal life.   Continue Psychosocial Services  Follow up required by staff      Psychosocial Re-Evaluation:   Psychosocial Discharge (Final Psychosocial Re-Evaluation):   Education: Education Goals: Education classes will be provided on a weekly basis, covering required topics. Participant will state understanding/return demonstration of topics presented.  Learning Barriers/Preferences:     Learning Barriers/Preferences - 10/19/16 1049      Learning Barriers/Preferences   Learning Barriers None   Learning Preferences Audio;Computer/Internet;Pictoral;Skilled Demonstration;Verbal Instruction;Video;Written Material;Individual Instruction;Group Instruction      Education Topics: Risk Factor Reduction:  -Group instruction that is supported by a PowerPoint presentation. Instructor discusses the definition of a risk factor, different risk factors for pulmonary disease, and how the heart and lungs work together.     Nutrition for Pulmonary Patient:  -Group instruction provided by PowerPoint slides, verbal discussion, and written materials to  support subject matter. The instructor gives an explanation and review of healthy diet recommendations, which includes a discussion on weight management, recommendations for fruit and vegetable consumption, as well as protein, fluid, caffeine, fiber, sodium, sugar, and alcohol. Tips for eating when patients are short of breath  are discussed.   Pursed Lip Breathing:  -Group instruction that is supported by demonstration and informational handouts. Instructor discusses the benefits of pursed lip and diaphragmatic breathing and detailed demonstration on how to preform both.     Oxygen Safety:  -Group instruction provided by PowerPoint, verbal discussion, and written material to support subject matter. There is an overview of "What is Oxygen" and "Why do we need it".  Instructor also reviews how to create a safe environment for oxygen use, the importance of using oxygen as prescribed, and the risks of noncompliance. There is a brief discussion on traveling with oxygen and resources the patient may utilize.   Oxygen Equipment:  -Group instruction provided by [facility] Staff utilizing handouts, written materials, and equipment demonstrations.   Signs and Symptoms:  -Group instruction provided by written material and verbal discussion to support subject matter. Warning signs and symptoms of infection, stroke, and heart attack are reviewed and when to call the physician/911 reinforced. Tips for preventing the spread of infection discussed.   Advanced Directives:  -Group instruction provided by verbal instruction and written material to support subject matter. Instructor reviews Advanced Directive laws and proper instruction for filling out document.   Pulmonary Video:  -Group video education that reviews the importance of medication and oxygen compliance, exercise, good nutrition, pulmonary hygiene, and pursed lip and diaphragmatic breathing for the pulmonary patient.   Exercise for the  Pulmonary Patient:  -Group instruction that is supported by a PowerPoint presentation. Instructor discusses benefits of exercise, core components of exercise, frequency, duration, and intensity of an exercise routine, importance of utilizing pulse oximetry during exercise, safety while exercising, and options of places to exercise outside of rehab.     Pulmonary Medications:  -Verbally interactive group education provided by instructor with focus on inhaled medications and proper administration.   Anatomy and Physiology of the Respiratory System and Intimacy:  -Group instruction provided by PowerPoint, verbal discussion, and written material to support subject matter. Instructor reviews respiratory cycle and anatomical components of the respiratory system and their functions. Instructor also reviews differences in obstructive and restrictive respiratory diseases with examples of each. Intimacy, Sex, and Sexuality differences are reviewed with a discussion on how relationships can change when diagnosed with pulmonary disease. Common sexual concerns are reviewed.   MD DAY -A group question and answer session with a medical doctor that allows participants to ask questions that relate to their pulmonary disease state.   OTHER EDUCATION -Group or individual verbal, written, or video instructions that support the educational goals of the pulmonary rehab program.   Knowledge Questionnaire Score:   Core Components/Risk Factors/Patient Goals at Admission:     Personal Goals and Risk Factors at Admission - 10/19/16 1056      Core Components/Risk Factors/Patient Goals on Admission   Improve shortness of breath with ADL's Yes   Intervention Provide education, individualized exercise plan and daily activity instruction to help decrease symptoms of SOB with activities of daily living.   Expected Outcomes Short Term: Achieves a reduction of symptoms when performing activities of daily living.    Develop more efficient breathing techniques such as purse lipped breathing and diaphragmatic breathing; and practicing self-pacing with activity Yes   Increase knowledge of respiratory medications and ability to use respiratory devices properly  Yes   Intervention Provide education and demonstration as needed of appropriate use of medications, inhalers, and oxygen therapy.   Expected Outcomes Short Term: Achieves understanding of medications use. Understands that oxygen is  a medication prescribed by physician. Demonstrates appropriate use of inhaler and oxygen therapy.      Core Components/Risk Factors/Patient Goals Review:    Core Components/Risk Factors/Patient Goals at Discharge (Final Review):    ITP Comments:   Comments:

## 2016-11-03 ENCOUNTER — Encounter (HOSPITAL_COMMUNITY): Payer: Medicare Other

## 2016-11-05 ENCOUNTER — Encounter (HOSPITAL_COMMUNITY)
Admission: RE | Admit: 2016-11-05 | Discharge: 2016-11-05 | Disposition: A | Discharge status: Home / Self Care | Payer: Medicare Other | Source: Ambulatory Visit | Attending: Thoracic Surgery (Cardiothoracic Vascular Surgery) | Admitting: Thoracic Surgery (Cardiothoracic Vascular Surgery)

## 2016-11-05 VITALS — Wt 222.2 lb

## 2016-11-05 DIAGNOSIS — C3491 Malignant neoplasm of unspecified part of right bronchus or lung: Secondary | ICD-10-CM

## 2016-11-05 NOTE — Progress Notes (Signed)
Daily Session Note  Patient Details  Name: James [name] MRN: [id number] Date of Birth: 10-06-1950 Referring Provider:     Pulmonary Rehab Walk Test from 10/29/2016 in [place]  Referring Provider  Dr. [name]      Encounter Date: 11/05/2016  Check In:     Session Check In - 11/05/16 1357      Check-In   Location [facility]   Staff Present Su Hilt, MS, ACSM RCEP, Exercise Physiologist;Lisa Colletta [place], RN, [place] physician immediately available to respond to emergencies [facility] immediately available   Physician(s) Dr. Allyson Sabal   Medication changes reported     No   Fall or balance concerns reported    No   Tobacco Cessation No Change   Warm-up and Cool-down Performed as group-led instruction   Resistance Training Performed Yes   VAD Patient? No     Pain Assessment   Currently in Pain? No/denies      Capillary Blood Glucose: No results found for this or any previous visit (from the past 24 hour(s)).      Exercise Prescription Changes - 11/05/16 1600      Response to Exercise   Blood Pressure (Admit) 136/80   Blood Pressure (Exercise) 160/90   Blood Pressure (Exit) 122/80   Heart Rate (Admit) 98 bpm   Heart Rate (Exercise) 109 bpm   Heart Rate (Exit) 90 bpm   Oxygen Saturation (Admit) 95 %   Oxygen Saturation (Exercise) 93 %   Oxygen Saturation (Exit) 95 %   Rating of Perceived Exertion (Exercise) 13   Perceived Dyspnea (Exercise) 2   Duration Progress to 45 minutes of aerobic exercise without signs/symptoms of physical distress   Intensity THRR unchanged     Progression   Progression Continue to progress workloads to maintain intensity without signs/symptoms of physical distress.     Resistance Training   Training Prescription Yes   Weight blue bands   Reps 10-15   Time 10 Minutes     Interval Training   Interval Training No     NuStep   Level 3   Minutes 17   METs 2     Track   Laps 16   Minutes 17      History  Smoking Status  . Never Smoker  Smokeless Tobacco  . Never Used    Goals Met:  Exercise tolerated well No report of cardiac concerns or symptoms Strength training completed today  Goals Unmet:  Not Applicable  Comments: Service time is from 1:30p to 3:05p     Dr. Rush Farmer is Medical Director for Pulmonary Rehab at [facility].

## 2016-11-09 ENCOUNTER — Other Ambulatory Visit: Payer: Self-pay | Admitting: Cardiology

## 2016-11-10 ENCOUNTER — Encounter (HOSPITAL_COMMUNITY)
Admission: RE | Admit: 2016-11-10 | Discharge: 2016-11-10 | Disposition: A | Discharge status: Home / Self Care | Payer: Medicare Other | Source: Ambulatory Visit | Attending: Thoracic Surgery (Cardiothoracic Vascular Surgery) | Admitting: Thoracic Surgery (Cardiothoracic Vascular Surgery)

## 2016-11-10 VITALS — Wt 223.5 lb

## 2016-11-10 DIAGNOSIS — I2510 Atherosclerotic heart disease of native coronary artery without angina pectoris: Secondary | ICD-10-CM | POA: Diagnosis not present

## 2016-11-10 DIAGNOSIS — F329 Major depressive disorder, single episode, unspecified: Secondary | ICD-10-CM | POA: Insufficient documentation

## 2016-11-10 DIAGNOSIS — E784 Other hyperlipidemia: Secondary | ICD-10-CM | POA: Diagnosis not present

## 2016-11-10 DIAGNOSIS — C3491 Malignant neoplasm of unspecified part of right bronchus or lung: Secondary | ICD-10-CM

## 2016-11-10 DIAGNOSIS — F419 Anxiety disorder, unspecified: Secondary | ICD-10-CM | POA: Diagnosis not present

## 2016-11-10 DIAGNOSIS — Z7901 Long term (current) use of anticoagulants: Secondary | ICD-10-CM | POA: Diagnosis not present

## 2016-11-10 DIAGNOSIS — K219 Gastro-esophageal reflux disease without esophagitis: Secondary | ICD-10-CM | POA: Insufficient documentation

## 2016-11-10 DIAGNOSIS — G4733 Obstructive sleep apnea (adult) (pediatric): Secondary | ICD-10-CM | POA: Diagnosis not present

## 2016-11-10 DIAGNOSIS — Z7982 Long term (current) use of aspirin: Secondary | ICD-10-CM | POA: Diagnosis not present

## 2016-11-10 DIAGNOSIS — E119 Type 2 diabetes mellitus without complications: Secondary | ICD-10-CM | POA: Insufficient documentation

## 2016-11-10 DIAGNOSIS — Z79899 Other long term (current) drug therapy: Secondary | ICD-10-CM | POA: Insufficient documentation

## 2016-11-10 NOTE — Progress Notes (Signed)
Pulmonary Individual Treatment Plan  Patient Details  Name: James [name] MRN: [id number] Date of Birth: [birth year] Referring Provider:     Pulmonary Rehab Walk Test from 10/29/2016 in [place]  Referring Provider  Dr. [name]      Initial Encounter Date:    Pulmonary Rehab Walk Test from 10/29/2016 in [place]  Date  10/29/16  Referring Provider  Dr. [name]      Visit Diagnosis: Adenocarcinoma of right lung [facility])  Patient's Home Medications on Admission:   Current Outpatient Prescriptions:  .  acetaminophen (TYLENOL) 325 MG tablet, Take 650 mg by mouth every 6 (six) hours as needed for moderate pain or headache., Disp: , Rfl:  .  aspirin EC 81 MG tablet, Take 1 tablet (81 mg total) by mouth daily., Disp: 90 tablet, Rfl: 3 .  atorvastatin (LIPITOR) 20 MG tablet, Take 1 tablet (20 mg total) by mouth daily., Disp: 90 tablet, Rfl: 3 .  FLAXSEED, LINSEED, PO, Take 20 mLs by mouth daily. Sprinkled in drink daily, Disp: , Rfl:  .  furosemide (LASIX) 40 MG tablet, TAKE 1 TABLET BY MOUTH EVERY DAY, Disp: 90 tablet, Rfl: 3 .  Multiple Vitamin (MULTIVITAMIN) tablet, Take 1 tablet by mouth daily., Disp: , Rfl:  .  Omega-3 Fatty Acids (FISH OIL) 500 MG CAPS, Take 500 mg by mouth daily. , Disp: , Rfl:  .  potassium chloride SA (K-DUR,KLOR-CON) 20 MEQ tablet, Take 1 tablet (20 mEq total) by mouth daily., Disp: 30 tablet, Rfl: 1 .  psyllium (METAMUCIL) 58.6 % packet, Take 1 packet by mouth daily., Disp: , Rfl:  .  [START ON 11/20/2016] rivaroxaban (XARELTO) 20 MG TABS tablet, Take 1 tablet (20 mg total) by mouth daily with supper., Disp: 30 tablet, Rfl: 1 .  Rivaroxaban 15 & 20 MG TBPK, Take as directed on package: Start with one 72m tablet by mouth twice a day with food. On Day 22, switch to one 215mtablet once a day with food., Disp: 51 each, Rfl: 0 .  sodium chloride (OCEAN) 0.65 % SOLN nasal spray, Place 2 sprays into  both nostrils at bedtime., Disp: , Rfl:  .  Suvorexant (BELSOMRA) 15 MG TABS, Take 1 tablet by mouth daily as needed., Disp: 30 tablet, Rfl: 5 .  Tetrahydrozoline-Zn Sulfate (ALLERGY RELIEF EYE DROPS OP), Apply 2 drops to eye daily as needed (allergy)., Disp: , Rfl:  .  traMADol (ULTRAM) 50 MG tablet, Take 50 mg by mouth every 4-6 hours PRN severe pain. (Patient taking differently: Take 50 mg by mouth every 4 (four) hours as needed for moderate pain. ), Disp: 30 tablet, Rfl: 0 .  Undecylenic Ac-Zn Undecylenate (FUNGI-NAIL TOE & FOOT EX), Apply 1 application topically daily., Disp: , Rfl:   Past Medical History: Past Medical History:  Diagnosis Date  . [place]  . Anxiety   . Arthritis    "knees, hips, back" (10/19/2012)  . Chronic lower back pain   . Colon polyps    10/27/2002, repeat letter 09/17/2007  . Coronary artery disease   . Depressive disorder, not elsewhere classified   . Diabetes mellitus without complication (HCC)    diet controlled  . Fasting hyperglycemia   . GERD (gastroesophageal reflux disease)   . Heart murmur   . Hemoptysis    abnormal CT Chest 01/29/10 - ? new GG changes RUL > not viz on plain cxr 02/26/2010  .  Mitral regurgitation    severe MR 08/2016  . MPN (myeloproliferative neoplasm) ([place])    1st detected 06/04/1998  . Obesity   . OSA on CPAP    last sleep study 10 years ago  . Other and unspecified hyperlipidemia   . Positive PPD 1965   "non reactive in 2012" (10/19/2012)  . Routine general medical examination at a health care facility   . Special screening for malignant neoplasm of prostate   . Spinal stenosis, unspecified region other than cervical   . Wrist pain, left     Tobacco Use: History  Smoking Status  . Never Smoker  Smokeless Tobacco  . Never Used    Labs: Recent Review Flowsheet Data    Labs for ITP Cardiac and Pulmonary Rehab Latest Ref Rng & Units 07/24/2015 06/29/2016 08/11/2016 08/11/2016  08/31/2016   Cholestrol 0 - 200 mg/dL 246(H) 245(H) - - -   LDLCALC 0 - 99 mg/dL - 170(H) - - -   LDLDIRECT mg/dL 177.0 - - - -   HDL >39.00 mg/dL 40.30 35.90(L) - - -   Trlycerides 0.0 - 149.0 mg/dL 211.0(H) 198.0(H) - - -   Hemoglobin A1c 4.8 - 5.6 % 6.5 6.4 - - 6.0(H)   PHART 7.350 - 7.450 - - - 7.305(L) 7.428   PCO2ART 32.0 - 48.0 mmHg - - - 46.9 35.0   HCO3 20.0 - 28.0 mmol/L - - 23.8 23.3 22.7   TCO2 0 - 100 mmol/L - - 25 25 -   ACIDBASEDEF 0.0 - 2.0 mmol/L - - 2.0 3.0(H) 1.1   O2SAT % - - 75.0 97.0 97.3      Capillary Blood Glucose: Lab Results  Component Value Date   GLUCAP 110 (H) 09/07/2016   GLUCAP 105 (H) 09/06/2016   GLUCAP 124 (H) 09/06/2016   GLUCAP 123 (H) 09/06/2016   GLUCAP 126 (H) 09/05/2016     ADL UCSD:     Pulmonary Assessment Scores    Row Name 11/07/16 1508         ADL UCSD   ADL Phase Entry     SOB Score total 43       CAT Score   CAT Score 16  Entry        Pulmonary Function Assessment:     Pulmonary Function Assessment - 10/19/16 1052      Breath   Bilateral Breath Sounds Clear   Shortness of Breath Yes      Exercise Target Goals:    Exercise Program Goal: Individual exercise prescription set with THRR, safety & activity barriers. Participant demonstrates ability to understand and report RPE using BORG scale, to self-measure pulse accurately, and to acknowledge the importance of the exercise prescription.  Exercise Prescription Goal: Starting with aerobic activity 30 plus minutes a day, 3 days per week for initial exercise prescription. Provide home exercise prescription and guidelines that participant acknowledges understanding prior to discharge.  Activity Barriers & Risk Stratification:   6 Minute Walk:     6 Minute Walk    Row Name 10/29/16 1626         6 Minute Walk   Phase Discharge     Distance 1547 feet     Walk Time 6 minutes     # of Rest Breaks 0     MPH 2.92     METS 3.22     RPE 15     Perceived  Dyspnea  4     Symptoms No  Resting HR 87 bpm     Resting BP 155/83     Max Ex. HR 149 bpm     Max Ex. BP 182/84     2 Minute Post BP 159/84       Interval HR   Baseline HR 87     1 Minute HR 112     2 Minute HR 127     3 Minute HR 130     4 Minute HR 134     5 Minute HR 134     6 Minute HR 149     2 Minute Post HR 102     Interval Heart Rate? Yes       Interval Oxygen   Interval Oxygen? Yes     Baseline Oxygen Saturation % 96 %     Baseline Liters of Oxygen 0 L     1 Minute Oxygen Saturation % 95 %     1 Minute Liters of Oxygen 0 L     2 Minute Oxygen Saturation % 91 %     2 Minute Liters of Oxygen 0 L     3 Minute Oxygen Saturation % 89 %     3 Minute Liters of Oxygen 0 L     4 Minute Oxygen Saturation % 89 %     4 Minute Liters of Oxygen 0 L     5 Minute Oxygen Saturation % 88 %     5 Minute Liters of Oxygen 0 L     6 Minute Oxygen Saturation % 86 %     6 Minute Liters of Oxygen 0 L     2 Minute Post Oxygen Saturation % 96 %     2 Minute Post Liters of Oxygen 0 L        Oxygen Initial Assessment:     Oxygen Initial Assessment - 10/29/16 1631      Initial 6 min Walk   Oxygen Used None   Resting Oxygen Saturation  during 6 min walk 96 %   Exercise Oxygen Saturation  during 6 min walk 86 %  will monitor-patient ran at the end of 29mt     Program Oxygen Prescription   Program Oxygen Prescription None      Oxygen Re-Evaluation:     Oxygen Re-Evaluation    Row Name 11/10/16 0904             Program Oxygen Prescription   Program Oxygen Prescription None         Home Oxygen   Home Oxygen Device None       Sleep Oxygen Prescription None       Home Exercise Oxygen Prescription None       Home at Rest Exercise Oxygen Prescription None          Oxygen Discharge (Final Oxygen Re-Evaluation):     Oxygen Re-Evaluation - 11/10/16 0904      Program Oxygen Prescription   Program Oxygen Prescription None     Home Oxygen   Home Oxygen Device  None   Sleep Oxygen Prescription None   Home Exercise Oxygen Prescription None   Home at Rest Exercise Oxygen Prescription None      Initial Exercise Prescription:     Initial Exercise Prescription - 10/29/16 1600      Date of Initial Exercise RX and Referring Provider   Date 10/29/16   Referring Provider Dr. HReginold Agent  Level 2   Minutes  17     NuStep   Level 2   Minutes 17   METs 1.5     Track   Laps 10   Minutes 17     Prescription Details   Frequency (times per week) 1   Duration Progress to 45 minutes of aerobic exercise without signs/symptoms of physical distress     Intensity   THRR 40-80% of Max Heartrate 61-122   Ratings of Perceived Exertion 11-13   Perceived Dyspnea 0-4     Progression   Progression Continue progressive overload as per policy without signs/symptoms or physical distress.     Resistance Training   Training Prescription Yes   Weight blue bands   Reps 10-15      Perform Capillary Blood Glucose checks as needed.  Exercise Prescription Changes:     Exercise Prescription Changes    Row Name 11/05/16 1600             Response to Exercise   Blood Pressure (Admit) 136/80       Blood Pressure (Exercise) 160/90       Blood Pressure (Exit) 122/80       Heart Rate (Admit) 98 bpm       Heart Rate (Exercise) 109 bpm       Heart Rate (Exit) 90 bpm       Oxygen Saturation (Admit) 95 %       Oxygen Saturation (Exercise) 93 %       Oxygen Saturation (Exit) 95 %       Rating of Perceived Exertion (Exercise) 13       Perceived Dyspnea (Exercise) 2       Duration Progress to 45 minutes of aerobic exercise without signs/symptoms of physical distress       Intensity THRR unchanged         Progression   Progression Continue to progress workloads to maintain intensity without signs/symptoms of physical distress.         Resistance Training   Training Prescription Yes       Weight blue bands       Reps 10-15       Time 10  Minutes         Interval Training   Interval Training No         NuStep   Level 3       Minutes 17       METs 2         Track   Laps 16       Minutes 17          Exercise Comments:   Exercise Goals and Review:   Exercise Goals Re-Evaluation :     Exercise Goals Re-Evaluation    Row Name 11/09/16 1144             Exercise Goal Re-Evaluation   Exercise Goals Review Increase Physical Activity;Increase Strenth and Stamina       Comments Patient has only attended one exercise session. Will cont. to monitor and progress as able.        Expected Outcomes Through exercising at rehab and at home, patient will increase physical strength and stamina and find ADL's easier to perform.           Discharge Exercise Prescription (Final Exercise Prescription Changes):     Exercise Prescription Changes - 11/05/16 1600      Response to Exercise   Blood Pressure (Admit) 136/80   Blood Pressure (  Exercise) 160/90   Blood Pressure (Exit) 122/80   Heart Rate (Admit) 98 bpm   Heart Rate (Exercise) 109 bpm   Heart Rate (Exit) 90 bpm   Oxygen Saturation (Admit) 95 %   Oxygen Saturation (Exercise) 93 %   Oxygen Saturation (Exit) 95 %   Rating of Perceived Exertion (Exercise) 13   Perceived Dyspnea (Exercise) 2   Duration Progress to 45 minutes of aerobic exercise without signs/symptoms of physical distress   Intensity THRR unchanged     Progression   Progression Continue to progress workloads to maintain intensity without signs/symptoms of physical distress.     Resistance Training   Training Prescription Yes   Weight blue bands   Reps 10-15   Time 10 Minutes     Interval Training   Interval Training No     NuStep   Level 3   Minutes 17   METs 2     Track   Laps 16   Minutes 17      Nutrition:  Target Goals: Understanding of nutrition guidelines, daily intake of sodium <1551m, cholesterol <2029m calories 30% from fat and 7% or less from saturated fats, daily  to have 5 or more servings of fruits and vegetables.  Biometrics:    Nutrition Therapy Plan and Nutrition Goals:   Nutrition Discharge: Rate Your Plate Scores:   Nutrition Goals Re-Evaluation:   Nutrition Goals Discharge (Final Nutrition Goals Re-Evaluation):   Psychosocial: Target Goals: Acknowledge presence or absence of significant depression and/or stress, maximize coping skills, provide positive support system. Participant is able to verbalize types and ability to use techniques and skills needed for reducing stress and depression.  Initial Review & Psychosocial Screening:     Initial Psych Review & Screening - 10/19/16 1057      Initial Review   Current issues with --  DEpressed after lung surgery d/t inability to do his normal activities     [id number]     Barriers   Psychosocial barriers to participate in program There are no identifiable barriers or psychosocial needs.     Screening Interventions   Interventions Encouraged to exercise      Quality of Life Scores:   PHQ-9: Recent Review Flowsheet Data    [facility] 10/19/2016 07/29/2016 07/27/2016 07/28/2015 07/24/2015   Decreased Interest 1 0 0 0 0   Down, Depressed, Hopeless 3 0 0 0 0   PHQ - 2 Score 4 0 0 0 0   Altered sleeping 3 - - - -   Tired, decreased energy 3 - - - -   Change in appetite 3 - - - -   Feeling bad or failure about yourself  0 - - - -   Trouble concentrating 0 - - - -   Moving slowly or fidgety/restless 0 - - - -   Suicidal thoughts 0 - - - -   PHQ-9 Score 13 - - - -   Difficult doing work/chores Not difficult at all - - - -     Interpretation of Total Score  Total Score Depression Severity:  1-4 = Minimal depression, 5-9 = Mild depression, 10-14 = Moderate depression, 15-19 = Moderately severe depression, 20-27 = Severe depression   Psychosocial Evaluation and Intervention:     Psychosocial Evaluation - 11/10/16 0907       Psychosocial Evaluation & Interventions   Interventions Encouraged to exercise with the program and follow exercise prescription  Psychosocial Re-Evaluation:     Psychosocial Re-Evaluation    [place] Name 11/10/16 0908             Psychosocial Re-Evaluation   Current issues with None Identified       Continue Psychosocial Services  No Follow up required          Psychosocial Discharge (Final Psychosocial Re-Evaluation):     Psychosocial Re-Evaluation - 11/10/16 0908      Psychosocial Re-Evaluation   Current issues with None Identified   Continue Psychosocial Services  No Follow up required      Education: Education Goals: Education classes will be provided on a weekly basis, covering required topics. Participant will state understanding/return demonstration of topics presented.  Learning Barriers/Preferences:     Learning Barriers/Preferences - 10/19/16 1049      Learning Barriers/Preferences   Learning Barriers None   Learning Preferences Audio;Computer/Internet;Pictoral;Skilled Demonstration;Verbal Instruction;Video;Written Material;Individual Instruction;Group Instruction      Education Topics: Risk Factor Reduction:  -Group instruction that is supported by a PowerPoint presentation. Instructor discusses the definition of a risk factor, different risk factors for pulmonary disease, and how the heart and lungs work together.     Nutrition for Pulmonary Patient:  -Group instruction provided by PowerPoint slides, verbal discussion, and written materials to support subject matter. The instructor gives an explanation and review of healthy diet recommendations, which includes a discussion on weight management, recommendations for fruit and vegetable consumption, as well as protein, fluid, caffeine, fiber, sodium, sugar, and alcohol. Tips for eating when patients are short of breath are discussed.   Pursed Lip Breathing:  -Group instruction that is supported by  demonstration and informational handouts. Instructor discusses the benefits of pursed lip and diaphragmatic breathing and detailed demonstration on how to preform both.     Oxygen Safety:  -Group instruction provided by PowerPoint, verbal discussion, and written material to support subject matter. There is an overview of "What is Oxygen" and "Why do we need it".  Instructor also reviews how to create a safe environment for oxygen use, the importance of using oxygen as prescribed, and the risks of noncompliance. There is a brief discussion on traveling with oxygen and resources the patient may utilize.   Oxygen Equipment:  -Group instruction provided by [facility] Staff utilizing handouts, written materials, and equipment demonstrations.   Signs and Symptoms:  -Group instruction provided by written material and verbal discussion to support subject matter. Warning signs and symptoms of infection, stroke, and heart attack are reviewed and when to call the physician/911 reinforced. Tips for preventing the spread of infection discussed.   Advanced Directives:  -Group instruction provided by verbal instruction and written material to support subject matter. Instructor reviews Advanced Directive laws and proper instruction for filling out document.   Pulmonary Video:  -Group video education that reviews the importance of medication and oxygen compliance, exercise, good nutrition, pulmonary hygiene, and pursed lip and diaphragmatic breathing for the pulmonary patient.   Exercise for the Pulmonary Patient:  -Group instruction that is supported by a PowerPoint presentation. Instructor discusses benefits of exercise, core components of exercise, frequency, duration, and intensity of an exercise routine, importance of utilizing pulse oximetry during exercise, safety while exercising, and options of places to exercise outside of rehab.     PULMONARY REHAB OTHER RESPIRATORY from 11/05/2016 in [place]  Date  11/05/16  Educator  md  Instruction Review Code  2- meets goals/outcomes  Pulmonary Medications:  -Verbally interactive group education provided by instructor with focus on inhaled medications and proper administration.   Anatomy and Physiology of the Respiratory System and Intimacy:  -Group instruction provided by PowerPoint, verbal discussion, and written material to support subject matter. Instructor reviews respiratory cycle and anatomical components of the respiratory system and their functions. Instructor also reviews differences in obstructive and restrictive respiratory diseases with examples of each. Intimacy, Sex, and Sexuality differences are reviewed with a discussion on how relationships can change when diagnosed with pulmonary disease. Common sexual concerns are reviewed.   MD DAY -A group question and answer session with a medical doctor that allows participants to ask questions that relate to their pulmonary disease state.   OTHER EDUCATION -Group or individual verbal, written, or video instructions that support the educational goals of the pulmonary rehab program.   Knowledge Questionnaire Score:     Knowledge Questionnaire Score - 11/07/16 1506      Knowledge Questionnaire Score   Pre Score 12/13      Core Components/Risk Factors/Patient Goals at Admission:     Personal Goals and Risk Factors at Admission - 10/19/16 1056      Core Components/Risk Factors/Patient Goals on Admission   Improve shortness of breath with ADL's Yes   Intervention Provide education, individualized exercise plan and daily activity instruction to help decrease symptoms of SOB with activities of daily living.   Expected Outcomes Short Term: Achieves a reduction of symptoms when performing activities of daily living.   Develop more efficient breathing techniques such as purse lipped breathing and diaphragmatic breathing; and practicing  self-pacing with activity Yes   Increase knowledge of respiratory medications and ability to use respiratory devices properly  Yes   Intervention Provide education and demonstration as needed of appropriate use of medications, inhalers, and oxygen therapy.   Expected Outcomes Short Term: Achieves understanding of medications use. Understands that oxygen is a medication prescribed by physician. Demonstrates appropriate use of inhaler and oxygen therapy.      Core Components/Risk Factors/Patient Goals Review:      Goals and Risk Factor Review    Row Name 11/10/16 0905             Core Components/Risk Factors/Patient Goals Review   Personal Goals Review Improve shortness of breath with ADL's;Increase knowledge of respiratory medications and ability to use respiratory devices properly.;Develop more efficient breathing techniques such as purse lipped breathing and diaphragmatic breathing and practicing self-pacing with activity.       Review Has only attended 1 exercise session, too early to have met any goals       Expected Outcomes expect to see more progression of goals in the next 30 days          Core Components/Risk Factors/Patient Goals at Discharge (Final Review):      Goals and Risk Factor Review - 11/10/16 0905      Core Components/Risk Factors/Patient Goals Review   Personal Goals Review Improve shortness of breath with ADL's;Increase knowledge of respiratory medications and ability to use respiratory devices properly.;Develop more efficient breathing techniques such as purse lipped breathing and diaphragmatic breathing and practicing self-pacing with activity.   Review Has only attended 1 exercise session, too early to have met any goals   Expected Outcomes expect to see more progression of goals in the next 30 days      ITP Comments:   Comments: ITP REVIEW Pt is making expected progress toward pulmonary rehab goals after completing  1 sessions. Recommend continued  exercise, life style modification, education, and utilization of breathing techniques to increase stamina and strength and decrease shortness of breath with exertion.

## 2016-11-10 NOTE — Progress Notes (Signed)
Daily Session Note  Patient Details  Name: TADARRIUS BURCH MRN: [id number] Date of Birth: 09-11-1950 Referring Provider:     Pulmonary Rehab Walk Test from 10/29/2016 in [place]  Referring Provider  Dr. [name]      Encounter Date: 11/10/2016  Check In:     Session Check In - 11/10/16 1330      Check-In   Location [facility]   Staff Present Rosebud Poles, RN, BSN;Molly diVincenzo, MS, ACSM RCEP, Exercise Physiologist;Jamarrion Budai Ysidro Evert, RN;Portia Rollene Rotunda, RN, BSN   Supervising physician immediately available to respond to emergencies [facility] immediately available   Physician(s) Dr. Allyson Sabal   Medication changes reported     No   Fall or balance concerns reported    No   Tobacco Cessation No Change   Warm-up and Cool-down Performed as group-led instruction   Resistance Training Performed Yes   VAD Patient? No     Pain Assessment   Currently in Pain? No/denies   Multiple Pain Sites No      Capillary Blood Glucose: No results found for this or any previous visit (from the past 24 hour(s)).      Exercise Prescription Changes - 11/10/16 1500      Response to Exercise   Blood Pressure (Admit) 120/70   Blood Pressure (Exercise) 136/70   Blood Pressure (Exit) 124/70   Heart Rate (Admit) 82 bpm   Heart Rate (Exercise) 121 bpm   Heart Rate (Exit) 86 bpm   Oxygen Saturation (Admit) 97 %   Oxygen Saturation (Exercise) 88 %   Oxygen Saturation (Exit) 96 %   Rating of Perceived Exertion (Exercise) 12   Perceived Dyspnea (Exercise) 3   Duration Progress to 45 minutes of aerobic exercise without signs/symptoms of physical distress   Intensity THRR unchanged     Progression   Progression Continue to progress workloads to maintain intensity without signs/symptoms of physical distress.     Resistance Training   Training Prescription Yes   Weight blue bands   Reps 10-15   Time 10 Minutes     Interval Training   Interval  Training No     NuStep   Level 4   Minutes 17   METs 2.6     Arm Ergometer   Level 6   Minutes 17     Track   Laps 18   Minutes 17      History  Smoking Status  . Never Smoker  Smokeless Tobacco  . Never Used    Goals Met:  Exercise tolerated well No report of cardiac concerns or symptoms Strength training completed today  Goals Unmet:  Not Applicable  Comments: Service time is from 1330 to 1455    Dr. Rush Farmer is Medical Director for Pulmonary Rehab at [facility].

## 2016-11-12 ENCOUNTER — Encounter (HOSPITAL_COMMUNITY)
Admission: RE | Admit: 2016-11-12 | Discharge: 2016-11-12 | Disposition: A | Discharge status: Home / Self Care | Payer: Medicare Other | Source: Ambulatory Visit | Attending: Thoracic Surgery (Cardiothoracic Vascular Surgery) | Admitting: Thoracic Surgery (Cardiothoracic Vascular Surgery)

## 2016-11-12 VITALS — Wt 224.6 lb

## 2016-11-12 DIAGNOSIS — C3491 Malignant neoplasm of unspecified part of right bronchus or lung: Secondary | ICD-10-CM | POA: Diagnosis not present

## 2016-11-12 NOTE — Progress Notes (Signed)
I have reviewed a Home Exercise Prescription with James [name] . James [name] is  currently exercising at home.  The patient was advised to walk 3 days a week for 45 minutes.  James [name] and I discussed how to progress their exercise prescription.  The patient stated that their goals were to get back to his baseline.  The patient stated that they understand the exercise prescription.  We reviewed exercise guidelines, target heart rate during exercise, oxygen use, weather, home pulse oximeter, endpoints for exercise, and goals.  Patient is encouraged to come to me with any questions. I will continue to follow up with the patient to assist them with progression and safety.

## 2016-11-12 NOTE — Progress Notes (Signed)
Daily Session Note  Patient Details  Name: James [name] MRN: [id number] Date of Birth: [birth year] Referring Provider:     Pulmonary Rehab Walk Test from 10/29/2016 in [place]  Referring Provider  Dr. [name]      Encounter Date: 11/12/2016  Check In:     Session Check In - 11/12/16 1324      Check-In   Location [facility]   Staff Present Trish Fountain, RN, Maxcine Ham, RN, BSN;Molly diVincenzo, MS, ACSM RCEP, Exercise Physiologist;Lisa Ysidro Evert, RN   Supervising physician immediately available to respond to emergencies [facility] immediately available   Physician(s) Dr. Wendee Beavers   Medication changes reported     No   Fall or balance concerns reported    No   Tobacco Cessation No Change   Warm-up and Cool-down Performed as group-led instruction   Resistance Training Performed Yes   VAD Patient? No     Pain Assessment   Currently in Pain? No/denies   Multiple Pain Sites No      Capillary Blood Glucose: No results found for this or any previous visit (from the past 24 hour(s)).      Exercise Prescription Changes - 11/12/16 1500      Response to Exercise   Blood Pressure (Admit) 140/64   Blood Pressure (Exercise) 144/76   Blood Pressure (Exit) 126/74   Heart Rate (Admit) 86 bpm   Heart Rate (Exercise) 123 bpm   Heart Rate (Exit) 77 bpm   Oxygen Saturation (Admit) 96 %   Oxygen Saturation (Exercise) 92 %   Oxygen Saturation (Exit) 96 %   Rating of Perceived Exertion (Exercise) 13   Perceived Dyspnea (Exercise) 3   Duration Progress to 45 minutes of aerobic exercise without signs/symptoms of physical distress   Intensity THRR unchanged     Progression   Progression Continue to progress workloads to maintain intensity without signs/symptoms of physical distress.     Resistance Training   Training Prescription Yes   Weight blue bands   Reps 10-15   Time 10 Minutes     Interval Training   Interval  Training No     NuStep   Level 4   Minutes 17   METs 1.7     Track   Laps 15   Minutes 17     Home Exercise Plan   Plans to continue exercise at Home (comment)   Frequency Add 3 additional days to program exercise sessions.      History  Smoking Status  . Never Smoker  Smokeless Tobacco  . Never Used    Goals Met:  Exercise tolerated well Strength training completed today  Goals Unmet:  Not Applicable  Comments: Service time is from 1330 to 1500    Dr. Rush Farmer is Medical Director for Pulmonary Rehab at [facility].

## 2016-11-16 ENCOUNTER — Other Ambulatory Visit: Payer: Self-pay | Admitting: Thoracic Surgery (Cardiothoracic Vascular Surgery)

## 2016-11-16 DIAGNOSIS — I2699 Other pulmonary embolism without acute cor pulmonale: Secondary | ICD-10-CM

## 2016-11-17 ENCOUNTER — Encounter (HOSPITAL_COMMUNITY)
Admission: RE | Admit: 2016-11-17 | Discharge: 2016-11-17 | Disposition: A | Discharge status: Home / Self Care | Payer: Medicare Other | Source: Ambulatory Visit | Attending: Thoracic Surgery (Cardiothoracic Vascular Surgery) | Admitting: Thoracic Surgery (Cardiothoracic Vascular Surgery)

## 2016-11-17 ENCOUNTER — Ambulatory Visit (INDEPENDENT_AMBULATORY_CARE_PROVIDER_SITE_OTHER): Payer: Self-pay | Admitting: Thoracic Surgery (Cardiothoracic Vascular Surgery)

## 2016-11-17 ENCOUNTER — Encounter: Payer: Self-pay | Admitting: Thoracic Surgery (Cardiothoracic Vascular Surgery)

## 2016-11-17 ENCOUNTER — Ambulatory Visit
Admission: RE | Admit: 2016-11-17 | Discharge: 2016-11-17 | Disposition: A | Discharge status: Home / Self Care | Payer: Medicare Other | Source: Ambulatory Visit | Attending: Thoracic Surgery (Cardiothoracic Vascular Surgery) | Admitting: Thoracic Surgery (Cardiothoracic Vascular Surgery)

## 2016-11-17 VITALS — Wt 223.5 lb

## 2016-11-17 VITALS — BP 150/90 | HR 76 | Resp 20 | Ht 69.0 in | Wt 223.0 lb

## 2016-11-17 DIAGNOSIS — I2699 Other pulmonary embolism without acute cor pulmonale: Secondary | ICD-10-CM

## 2016-11-17 DIAGNOSIS — C3491 Malignant neoplasm of unspecified part of right bronchus or lung: Secondary | ICD-10-CM

## 2016-11-17 DIAGNOSIS — Z902 Acquired absence of lung [part of]: Secondary | ICD-10-CM

## 2016-11-17 DIAGNOSIS — C3411 Malignant neoplasm of upper lobe, right bronchus or lung: Secondary | ICD-10-CM

## 2016-11-17 NOTE — Progress Notes (Signed)
Daily Session Note  Patient Details  Name: James [name] MRN: [id number] Date of Birth: 1951-03-07 Referring Provider:     Pulmonary Rehab Walk Test from 10/29/2016 in [place]  Referring Provider  Dr. [name]      Encounter Date: 11/17/2016  Check In:     Session Check In - 11/17/16 1330      Check-In   Location [facility]   Staff Present Rosebud Poles, RN, BSN;Eloni Darius Ysidro Evert, RN;Portia Rollene Rotunda, RN, BSN   Supervising physician immediately available to respond to emergencies [facility] immediately available   Physician(s) Dr. Doyle Askew   Medication changes reported     No   Fall or balance concerns reported    No   Tobacco Cessation No Change   Warm-up and Cool-down Performed as group-led instruction   Resistance Training Performed Yes   VAD Patient? No     Pain Assessment   Currently in Pain? No/denies   Multiple Pain Sites No      Capillary Blood Glucose: No results found for this or any previous visit (from the past 24 hour(s)).      Exercise Prescription Changes - 11/17/16 1500      Response to Exercise   Blood Pressure (Admit) 136/72   Blood Pressure (Exercise) 180/90   Blood Pressure (Exit) 112/80   Heart Rate (Admit) 73 bpm   Heart Rate (Exercise) 119 bpm   Heart Rate (Exit) 88 bpm   Oxygen Saturation (Admit) 97 %   Oxygen Saturation (Exercise) 89 %   Oxygen Saturation (Exit) 95 %   Rating of Perceived Exertion (Exercise) 12   Perceived Dyspnea (Exercise) 3   Duration Continue with 45 min of aerobic exercise without signs/symptoms of physical distress.   Intensity THRR unchanged     Progression   Progression Continue to progress workloads to maintain intensity without signs/symptoms of physical distress.     Resistance Training   Training Prescription Yes   Weight blue bands   Reps 10-15   Time 10 Minutes     Interval Training   Interval Training No     NuStep   Level 7   Minutes 17   METs 2.4     Rower   Level 4   Minutes 17     Track   Laps 19   Minutes 17      History  Smoking Status  . Never Smoker  Smokeless Tobacco  . Never Used    Goals Met:  Exercise tolerated well No report of cardiac concerns or symptoms Strength training completed today  Goals Unmet:  Not Applicable  Comments: Service time is from 1330 to 1455    Dr. Rush Farmer is Medical Director for Pulmonary Rehab at [facility].

## 2016-11-17 NOTE — Progress Notes (Signed)
[address]       [place] [address]             [phone]    HPI: James [name] returns for a scheduled postoperative follow-up visit  James [name] 66 year old man who had a right upper lobectomy on 09/02/2016. He had a stage IA non-small cell carcinoma. He went home on postoperative day #5. He saw Dr. Julien Nordmann and will not require adjuvant therapy.  He was readmitted on 10/20/2016 after complaining of shortness of breath. A CT of the chest showed a small PE. He was started on anticoagulation for that. However I think that clot was far too small to explain his symptoms.  He has been working with pulmonary rehabilitation since his discharge from the hospital. He thinks that is helping some. He remains extremely anxious about the pace of his recovery. He can't understand why he is not back to his full capacity. He complains that his voice gets weaker as the day goes along, but denies hoarseness.  Past Medical History:  Diagnosis Date  . Adenocarcinoma of right lung, stage 1 ([place]) 09/06/2016  . Anxiety   . Arthritis    "knees, hips, back" (10/19/2012)  . Chronic lower back pain   . Colon polyps    10/27/2002, repeat letter 09/17/2007  . Coronary artery disease   . Depressive disorder, not elsewhere classified   . Diabetes mellitus without complication (HCC)    diet controlled  . Fasting hyperglycemia   . GERD (gastroesophageal reflux disease)   . Heart murmur   . Hemoptysis    abnormal CT Chest 01/29/10 - ? new GG changes RUL > not viz on plain cxr 02/26/2010  . Mitral regurgitation    severe MR 08/2016  . MPN (myeloproliferative neoplasm) ([place])    1st detected 06/04/1998  . Obesity   . OSA on CPAP    last sleep study 10 years ago  . Other and unspecified hyperlipidemia   . Positive PPD 1965   "non reactive in 2012" (10/19/2012)  . Routine general medical examination at a health care facility   . Special screening for malignant neoplasm of prostate   . Spinal  stenosis, unspecified region other than cervical   . Wrist pain, left     Past Medical History:  Diagnosis Date  . Adenocarcinoma of right lung, stage 1 ([place]) 09/06/2016  . Anxiety   . Arthritis    "knees, hips, back" (10/19/2012)  . Chronic lower back pain   . Colon polyps    10/27/2002, repeat letter 09/17/2007  . Coronary artery disease   . Depressive disorder, not elsewhere classified   . Diabetes mellitus without complication (HCC)    diet controlled  . Fasting hyperglycemia   . GERD (gastroesophageal reflux disease)   . Heart murmur   . Hemoptysis    abnormal CT Chest 01/29/10 - ? new GG changes RUL > not viz on plain cxr 02/26/2010  . Mitral regurgitation    severe MR 08/2016  . MPN (myeloproliferative neoplasm) ([place])    1st detected 06/04/1998  . Obesity   . OSA on CPAP    last sleep study 10 years ago  . Other and unspecified hyperlipidemia   . Positive PPD 1965   "non reactive in 2012" (10/19/2012)  . Routine general medical examination at a health care facility   . Special screening for malignant neoplasm of prostate   . Spinal stenosis, unspecified region other than cervical   .  Wrist pain, left      Current Outpatient Prescriptions  Medication Sig Dispense Refill  . acetaminophen (TYLENOL) 325 MG tablet Take 650 mg by mouth every 6 (six) hours as needed for moderate pain or headache.    Marland Kitchen aspirin EC 81 MG tablet Take 1 tablet (81 mg total) by mouth daily. 90 tablet 3  . atorvastatin (LIPITOR) 20 MG tablet Take 1 tablet (20 mg total) by mouth daily. 90 tablet 3  . FLAXSEED, LINSEED, PO Take 20 mLs by mouth daily. Sprinkled in drink daily    . furosemide (LASIX) 40 MG tablet TAKE 1 TABLET BY MOUTH EVERY DAY 90 tablet 3  . Multiple Vitamin (MULTIVITAMIN) tablet Take 1 tablet by mouth daily.    . Omega-3 Fatty Acids (FISH OIL) 500 MG CAPS Take 500 mg by mouth daily.     . potassium chloride SA (K-DUR,KLOR-CON) 20 MEQ tablet Take 1 tablet (20 mEq total) by mouth daily.  30 tablet 1  . psyllium (METAMUCIL) 58.6 % packet Take 1 packet by mouth daily.    Derrill Memo ON 11/20/2016] rivaroxaban (XARELTO) 20 MG TABS tablet Take 1 tablet (20 mg total) by mouth daily with supper. 30 tablet 1  . Rivaroxaban 15 & 20 MG TBPK Take as directed on package: Start with one 15mg  tablet by mouth twice a day with food. On Day 22, switch to one 20mg  tablet once a day with food. 51 each 0  . sodium chloride (OCEAN) 0.65 % SOLN nasal spray Place 2 sprays into both nostrils at bedtime.    Bethann Humble Sulfate (ALLERGY RELIEF EYE DROPS OP) Apply 2 drops to eye daily as needed (allergy).    . traMADol (ULTRAM) 50 MG tablet Take 50 mg by mouth every 4-6 hours PRN severe pain. (Patient taking differently: Take 50 mg by mouth every 4 (four) hours as needed for moderate pain. ) 30 tablet 0  . Undecylenic Ac-Zn Undecylenate (FUNGI-NAIL TOE & FOOT EX) Apply 1 application topically daily.     No current facility-administered medications for this visit.     Physical Exam BP (!) 150/90   Pulse 76   Resp 20   Ht 5\' 9"  (1.753 m)   Wt 223 lb (101.2 kg)   SpO2 97% Comment: RA  BMI 32.68 kg/m  66 year old man in no acute distress Anxious Diminished breath sounds right base, otherwise clear Incisions well healed Cardiac regular rate and rhythm with a prominent systolic murmur 1+ edema both lower extremities  Diagnostic Tests: CHEST  2 VIEW  COMPARISON:  Chest x-ray dated 10/20/2016  FINDINGS: Heart size and mediastinal contours are normal. Postsurgical change with volume loss on the right, stable. Lungs otherwise clear. No pleural effusion or pneumothorax seen.  Mild degenerative spurring throughout the thoracic spine. No acute or suspicious osseous finding.  IMPRESSION: No active cardiopulmonary disease. No evidence of pneumonia or pulmonary edema.   Electronically Signed   By: Franki Cabot M.D.   On: 11/17/2016 12:17 I personally reviewed the chest x-ray and  concur with the findings noted above  Impression: James [name] is a 66 year old man who had a thoracoscopic right upper lobectomy about to have months ago for a stage IA non-small cell carcinoma. He will not require adjuvant chemotherapy.  I think he is really about on pace in terms of his recovery. He may be a little slow, but that is not surprising given that he has severe mitral regurgitation also. I encouraged him to continue with pulmonary rehabilitation.  I think he can go ahead with his mitral valve repair pretty much any time. There is not a great deal of urgency, but there is not much benefit to delaying further at this point either.   Plan: Follow-up with Dr. Roxy Manns to begin discussions regarding timing of mitral valve repair.  I'll see him back in 2 months to check on his progress.  Melrose Nakayama, MD [facility] [phone]

## 2016-11-19 ENCOUNTER — Encounter (HOSPITAL_COMMUNITY)
Admission: RE | Admit: 2016-11-19 | Discharge: 2016-11-19 | Disposition: A | Discharge status: Home / Self Care | Payer: Medicare Other | Source: Ambulatory Visit | Attending: Thoracic Surgery (Cardiothoracic Vascular Surgery) | Admitting: Thoracic Surgery (Cardiothoracic Vascular Surgery)

## 2016-11-19 VITALS — Wt 224.0 lb

## 2016-11-19 DIAGNOSIS — C3491 Malignant neoplasm of unspecified part of right bronchus or lung: Secondary | ICD-10-CM | POA: Diagnosis not present

## 2016-11-19 NOTE — Progress Notes (Signed)
Daily Session Note  Patient Details  Name: James [name] MRN: [id number] Date of Birth: 08-26-1950 Referring Provider:     Pulmonary Rehab Walk Test from 10/29/2016 in [place]  Referring Provider  Dr. [name]      Encounter Date: 11/19/2016  Check In:     Session Check In - 11/19/16 1348      Check-In   Location [facility]   Staff Present Trish Fountain, RN, Maxcine Ham, RN, Roque Cash, RN   Supervising physician immediately available to respond to emergencies [facility] immediately available   Physician(s) Dr. Maryland Pink   Medication changes reported     No   Fall or balance concerns reported    No   Tobacco Cessation No Change   Warm-up and Cool-down Performed as group-led instruction   Resistance Training Performed Yes   VAD Patient? No     Pain Assessment   Currently in Pain? No/denies   Multiple Pain Sites No      Capillary Blood Glucose: No results found for this or any previous visit (from the past 24 hour(s)).      Exercise Prescription Changes - 11/19/16 1500      Response to Exercise   Blood Pressure (Admit) 136/64   Blood Pressure (Exercise) 170/80   Blood Pressure (Exit) 118/62   Heart Rate (Admit) 80 bpm   Heart Rate (Exercise) 122 bpm   Heart Rate (Exit) 84 bpm   Oxygen Saturation (Admit) 97 %   Oxygen Saturation (Exercise) 90 %   Oxygen Saturation (Exit) 96 %   Rating of Perceived Exertion (Exercise) 13   Perceived Dyspnea (Exercise) 2   Duration Continue with 45 min of aerobic exercise without signs/symptoms of physical distress.   Intensity THRR unchanged     Progression   Progression Continue to progress workloads to maintain intensity without signs/symptoms of physical distress.     Resistance Training   Training Prescription Yes   Weight blue bands   Reps 10-15   Time 10 Minutes     Interval Training   Interval Training No     NuStep   Level 7   Minutes 17   METs 2.7     Track   Laps 19   Minutes 17      History  Smoking Status  . Never Smoker  Smokeless Tobacco  . Never Used    Goals Met:  Exercise tolerated well Strength training completed today  Goals Unmet:  Not Applicable  Comments: Service time is from 1330 to 1510    Dr. Rush Farmer is Medical Director for Pulmonary Rehab at [facility].

## 2016-11-24 ENCOUNTER — Encounter (HOSPITAL_COMMUNITY)
Admission: RE | Admit: 2016-11-24 | Discharge: 2016-11-24 | Disposition: A | Discharge status: Home / Self Care | Payer: Medicare Other | Source: Ambulatory Visit | Attending: Thoracic Surgery (Cardiothoracic Vascular Surgery) | Admitting: Thoracic Surgery (Cardiothoracic Vascular Surgery)

## 2016-11-24 DIAGNOSIS — C3491 Malignant neoplasm of unspecified part of right bronchus or lung: Secondary | ICD-10-CM | POA: Diagnosis not present

## 2016-11-24 NOTE — Progress Notes (Signed)
James [name] 67 y.o. male             Nutrition Screen & Note  Past Medical History:  Diagnosis Date  . Adenocarcinoma of right lung, stage 1 ([place]) 09/06/2016  . Anxiety   . Arthritis    "knees, hips, back" (10/19/2012)  . Chronic lower back pain   . Colon polyps    10/27/2002, repeat letter 09/17/2007  . Coronary artery disease   . Depressive disorder, not elsewhere classified   . Diabetes mellitus without complication (HCC)    diet controlled  . Fasting hyperglycemia   . GERD (gastroesophageal reflux disease)   . Heart murmur   . Hemoptysis    abnormal CT Chest 01/29/10 - ? new GG changes RUL > not viz on plain cxr 02/26/2010  . Mitral regurgitation    severe MR 08/2016  . MPN (myeloproliferative neoplasm) ([place])    1st detected 06/04/1998  . Obesity   . OSA on CPAP    last sleep study 10 years ago  . Other and unspecified hyperlipidemia   . Positive PPD 1965   "non reactive in 2012" (10/19/2012)  . Routine general medical examination at a health care facility   . Special screening for malignant neoplasm of prostate   . Spinal stenosis, unspecified region other than cervical   . Wrist pain, left    Meds reviewed.  Ht: Ht Readings from Last 1 Encounters:  11/17/16 5\' 9"  (1.753 m)    Wt:  Wt Readings from Last 3 Encounters:  11/19/16 223 lb 15.8 oz (101.6 kg)  11/17/16 223 lb 8.7 oz (101.4 kg)  11/17/16 223 lb (101.2 kg)    BMI: 33    Current tobacco use? No  Labs:  Lipid Panel     Component Value Date/Time   CHOL 245 (H) 06/29/2016 1153   TRIG 198.0 (H) 06/29/2016 1153   TRIG 108 05/20/2006 0825   HDL 35.90 (L) 06/29/2016 1153   CHOLHDL 7 06/29/2016 1153   VLDL 39.6 06/29/2016 1153   LDLCALC 170 (H) 06/29/2016 1153   LDLDIRECT 177.0 07/24/2015 1052    Lab Results  Component Value Date   HGBA1C 6.0 (H) 08/31/2016    Nutrition Note Spoke with pt. Pt is obese.  Pt eats 2 meals a day; a light breakfast and a big meal around 2-4 pm.  There are some ways the  pt can improve his eating habits.  Pt's Rate Your Plate results reviewed with pt. Pt is a diet-controlled diabetic according to EMR. Pt reports he was told he is a "borderline DM."  Pt is trying to watch his carb intake.  Pt states he wants to lose wt, but is "focusing on eating well, decreasing night time eating, and staying under 220 lb at this time." Pt expressed anxiety about "upcoming surgeries." Pt wt is down 11 lb over the past 4 months. Per pt, "my lowest recent wt at home was 214 lb after my procedure." Pt expressed understanding of the information reviewed via feedback method.    Nutrition Diagnosis ? Food-and nutrition-related knowledge deficit related to lack of exposure to information as related to diagnosis of pulmonary disease ? Obesity related to excessive energy intake as evidenced by a BMI of 33.0  Nutrition Intervention ? Pt's individual nutrition plan and goals reviewed with pt. ? Benefits of adopting healthy eating habits discussed when pt's Rate Your Plate reviewed.  Goal(s) 1. Identify food quantities necessary to maintain wt around 220 lb until pt  ready to focus on wt loss 2. Pt to be able to name foods that affect blood glucose.  3. Describe the benefit of including fruits, vegetables, whole grains, and low-fat dairy products in a healthy meal plan. 4. Pt to increase frequency of food consumed to at least a small snack (e.g. 1/4 cup nuts, Greek yogurt) every 5 hours. Plan:  Pt to attend Pulmonary Nutrition class Will provide client-centered nutrition education as part of interdisciplinary care.   Monitor and evaluate progress toward nutrition goal with team.  Monitor and Evaluate progress toward nutrition goal with team.   Derek Mound, M.Ed, RD, LDN, CDE 11/24/2016 1:23 PM

## 2016-11-24 NOTE — Progress Notes (Signed)
Daily Session Note  Patient Details  Name: James [name] MRN: [id number] Date of Birth: 06/06/1950 Referring Provider:     Pulmonary Rehab Walk Test from 10/29/2016 in [place]  Referring Provider  Dr. [name]      Encounter Date: 11/24/2016  Check In:     Session Check In - 11/24/16 1516      Check-In   Location [facility]   Staff Present Rosebud Poles, RN, BSN;Molly diVincenzo, MS, ACSM RCEP, Exercise Physiologist;Tiarra Anastacio Ysidro Evert, RN   Supervising physician immediately available to respond to emergencies [facility] immediately available   Physician(s) Dr. Maryland Pink   Medication changes reported     No   Fall or balance concerns reported    No   Tobacco Cessation No Change   Warm-up and Cool-down Performed as group-led instruction   Resistance Training Performed Yes   VAD Patient? No     Pain Assessment   Currently in Pain? No/denies   Multiple Pain Sites No      Capillary Blood Glucose: No results found for this or any previous visit (from the past 24 hour(s)).      Exercise Prescription Changes - 11/24/16 1500      Response to Exercise   Blood Pressure (Admit) 132/64   Blood Pressure (Exercise) 130/70   Blood Pressure (Exit) 126/68   Heart Rate (Admit) 76 bpm   Heart Rate (Exercise) 109 bpm   Heart Rate (Exit) 94 bpm   Oxygen Saturation (Admit) 96 %   Oxygen Saturation (Exercise) 94 %   Oxygen Saturation (Exit) 95 %   Rating of Perceived Exertion (Exercise) 13   Perceived Dyspnea (Exercise) 2   Duration Continue with 30 min of aerobic exercise without signs/symptoms of physical distress.   Intensity THRR unchanged     Progression   Progression Continue to progress workloads to maintain intensity without signs/symptoms of physical distress.     Resistance Training   Training Prescription Yes   Weight blue bands   Reps 10-15   Time 10 Minutes     Interval Training   Interval Training No     NuStep   Level 7   Minutes 17   METs 2.7     Track   Laps 16   Minutes 17      History  Smoking Status  . Never Smoker  Smokeless Tobacco  . Never Used    Goals Met:  Exercise tolerated well No report of cardiac concerns or symptoms Strength training completed today  Goals Unmet:  Not Applicable  Comments: Service time is from 1330 to 1455     Dr. Rush Farmer is Medical Director for Pulmonary Rehab at [facility].

## 2016-11-26 ENCOUNTER — Encounter (HOSPITAL_COMMUNITY)
Admission: RE | Admit: 2016-11-26 | Discharge: 2016-11-26 | Disposition: A | Discharge status: Home / Self Care | Payer: Medicare Other | Source: Ambulatory Visit | Attending: Thoracic Surgery (Cardiothoracic Vascular Surgery) | Admitting: Thoracic Surgery (Cardiothoracic Vascular Surgery)

## 2016-11-26 VITALS — Wt 224.2 lb

## 2016-11-26 DIAGNOSIS — C3491 Malignant neoplasm of unspecified part of right bronchus or lung: Secondary | ICD-10-CM | POA: Diagnosis not present

## 2016-11-26 NOTE — Progress Notes (Signed)
Daily Session Note  Patient Details  Name: James [name] MRN: [id number] Date of Birth: [birth year] Referring Provider:     Pulmonary Rehab Walk Test from 10/29/2016 in [place]  Referring Provider  Dr. [name]      Encounter Date: 11/26/2016  Check In:     Session Check In - 11/26/16 1347      Check-In   Location [facility]   Staff Present Su Hilt, MS, ACSM RCEP, Exercise Physiologist;Mekala Winger Ysidro Evert, RN;Joan Leonia Reeves, RN, Luisa Hart, RN, BSN   Supervising physician immediately available to respond to emergencies [facility] immediately available   Physician(s) Dr. Burnis Medin   Medication changes reported     No   Fall or balance concerns reported    No   Tobacco Cessation No Change   Warm-up and Cool-down Performed as group-led instruction   Resistance Training Performed Yes   VAD Patient? No     Pain Assessment   Currently in Pain? No/denies   Multiple Pain Sites No      Capillary Blood Glucose: No results found for this or any previous visit (from the past 24 hour(s)).      Exercise Prescription Changes - 11/26/16 1500      Response to Exercise   Blood Pressure (Admit) 150/64   Blood Pressure (Exercise) 182/94   Blood Pressure (Exit) 124/72   Heart Rate (Admit) 81 bpm   Heart Rate (Exercise) 106 bpm   Heart Rate (Exit) 96 bpm   Oxygen Saturation (Admit) 96 %   Oxygen Saturation (Exercise) 95 %   Oxygen Saturation (Exit) 96 %   Rating of Perceived Exertion (Exercise) 13   Perceived Dyspnea (Exercise) 3   Duration Continue with 45 min of aerobic exercise without signs/symptoms of physical distress.   Intensity THRR unchanged     Progression   Progression Continue to progress workloads to maintain intensity without signs/symptoms of physical distress.     Resistance Training   Training Prescription Yes   Weight blue bands   Reps 10-15   Time 10 Minutes     Interval Training   Interval  Training No     Rower   Level 4   Minutes 17     Track   Laps 16   Minutes 17      History  Smoking Status  . Never Smoker  Smokeless Tobacco  . Never Used    Goals Met:  Exercise tolerated well No report of cardiac concerns or symptoms Strength training completed today  Goals Unmet:  Not Applicable  Comments: Service time is from 1330 to 1530    Dr. Rush Farmer is Medical Director for Pulmonary Rehab at [facility].

## 2016-12-01 ENCOUNTER — Encounter (HOSPITAL_COMMUNITY): Payer: Medicare Other

## 2016-12-03 ENCOUNTER — Encounter (HOSPITAL_COMMUNITY): Payer: Medicare Other

## 2016-12-08 ENCOUNTER — Encounter (HOSPITAL_COMMUNITY)
Admission: RE | Admit: 2016-12-08 | Discharge: 2016-12-08 | Disposition: A | Discharge status: Home / Self Care | Payer: Medicare Other | Source: Ambulatory Visit | Attending: Thoracic Surgery (Cardiothoracic Vascular Surgery) | Admitting: Thoracic Surgery (Cardiothoracic Vascular Surgery)

## 2016-12-08 VITALS — Wt 224.6 lb

## 2016-12-08 DIAGNOSIS — C3491 Malignant neoplasm of unspecified part of right bronchus or lung: Secondary | ICD-10-CM

## 2016-12-08 NOTE — Progress Notes (Signed)
Daily Session Note  Patient Details  Name: James [name] MRN: [id number] Date of Birth: 30-May-1950 Referring Provider:     Pulmonary Rehab Walk Test from 10/29/2016 in [place]  Referring Provider  Dr. [name]      Encounter Date: 12/08/2016  Check In:     Session Check In - 12/08/16 1541      Check-In   Location [facility]   Staff Present Su Hilt, MS, ACSM RCEP, Exercise Physiologist;Reo Portela Ysidro Evert, RN;Olinty Celesta Aver, MS, ACSM CEP, Exercise Physiologist   Supervising physician immediately available to respond to emergencies [facility] immediately available   Physician(s) Dr. [place] Pink   Medication changes reported     No   Fall or balance concerns reported    No   Tobacco Cessation No Change   Warm-up and Cool-down Performed as group-led instruction   Resistance Training Performed Yes   VAD Patient? No     Pain Assessment   Currently in Pain? No/denies   Multiple Pain Sites No      Capillary Blood Glucose: No results found for this or any previous visit (from the past 24 hour(s)).      Exercise Prescription Changes - 12/08/16 1500      Response to Exercise   Blood Pressure (Admit) 134/78   Blood Pressure (Exercise) 170/80   Blood Pressure (Exit) 122/60   Heart Rate (Admit) 80 bpm   Heart Rate (Exercise) 118 bpm   Heart Rate (Exit) 97 bpm   Oxygen Saturation (Admit) 96 %   Oxygen Saturation (Exercise) 93 %   Oxygen Saturation (Exit) 96 %   Rating of Perceived Exertion (Exercise) 13   Perceived Dyspnea (Exercise) 2   Duration Continue with 45 min of aerobic exercise without signs/symptoms of physical distress.   Intensity THRR unchanged     Progression   Progression Continue to progress workloads to maintain intensity without signs/symptoms of physical distress.     Resistance Training   Training Prescription Yes   Weight blue bands   Reps 10-15   Time 10 Minutes     Interval Training    Interval Training No     NuStep   Level 7   Minutes 17   METs 3.4     Rower   Level 4   Minutes 17     Track   Laps 18   Minutes 17      History  Smoking Status  . Never Smoker  Smokeless Tobacco  . Never Used    Goals Met:  Exercise tolerated well No report of cardiac concerns or symptoms Strength training completed today  Goals Unmet:  Not Applicable  Comments: Service time is from 1330 to 1450    Dr. Rush Farmer is Medical Director for Pulmonary Rehab at [facility].

## 2016-12-08 NOTE — Progress Notes (Signed)
Pulmonary Individual Treatment Plan  Patient Details  Name: James [name] MRN: [id number] Date of Birth: 1950/07/04 Referring Provider:     Pulmonary Rehab Walk Test from 10/29/2016 in [place]  Referring Provider  Dr. [name]      Initial Encounter Date:    Pulmonary Rehab Walk Test from 10/29/2016 in [place]  Date  10/29/16  Referring Provider  Dr. [name]      Visit Diagnosis: Adenocarcinoma of right lung [facility])  Patient's Home Medications on Admission:   Current Outpatient Prescriptions:  .  acetaminophen (TYLENOL) 325 MG tablet, Take 650 mg by mouth every 6 (six) hours as needed for moderate pain or headache., Disp: , Rfl:  .  aspirin EC 81 MG tablet, Take 1 tablet (81 mg total) by mouth daily., Disp: 90 tablet, Rfl: 3 .  atorvastatin (LIPITOR) 20 MG tablet, Take 1 tablet (20 mg total) by mouth daily., Disp: 90 tablet, Rfl: 3 .  FLAXSEED, LINSEED, PO, Take 20 mLs by mouth daily. Sprinkled in drink daily, Disp: , Rfl:  .  furosemide (LASIX) 40 MG tablet, TAKE 1 TABLET BY MOUTH EVERY DAY, Disp: 90 tablet, Rfl: 3 .  Multiple Vitamin (MULTIVITAMIN) tablet, Take 1 tablet by mouth daily., Disp: , Rfl:  .  Omega-3 Fatty Acids (FISH OIL) 500 MG CAPS, Take 500 mg by mouth daily. , Disp: , Rfl:  .  potassium chloride SA (K-DUR,KLOR-CON) 20 MEQ tablet, Take 1 tablet (20 mEq total) by mouth daily., Disp: 30 tablet, Rfl: 1 .  psyllium (METAMUCIL) 58.6 % packet, Take 1 packet by mouth daily., Disp: , Rfl:  .  rivaroxaban (XARELTO) 20 MG TABS tablet, Take 1 tablet (20 mg total) by mouth daily with supper., Disp: 30 tablet, Rfl: 1 .  Rivaroxaban 15 & 20 MG TBPK, Take as directed on package: Start with one 108m tablet by mouth twice a day with food. On Day 22, switch to one 249mtablet once a day with food., Disp: 51 each, Rfl: 0 .  sodium chloride (OCEAN) 0.65 % SOLN nasal spray, Place 2 sprays into both nostrils at  bedtime., Disp: , Rfl:  .  Tetrahydrozoline-Zn Sulfate (ALLERGY RELIEF EYE DROPS OP), Apply 2 drops to eye daily as needed (allergy)., Disp: , Rfl:  .  traMADol (ULTRAM) 50 MG tablet, Take 50 mg by mouth every 4-6 hours PRN severe pain. (Patient taking differently: Take 50 mg by mouth every 4 (four) hours as needed for moderate pain. ), Disp: 30 tablet, Rfl: 0 .  Undecylenic Ac-Zn Undecylenate (FUNGI-NAIL TOE & FOOT EX), Apply 1 application topically daily., Disp: , Rfl:   Past Medical History: Past Medical History:  Diagnosis Date  . Adenocarcinoma of right lung, stage 1 ([facility]4/29/2018  . Anxiety   . Arthritis    "knees, hips, back" (10/19/2012)  . Chronic lower back pain   . Colon polyps    10/27/2002, repeat letter 09/17/2007  . Coronary artery disease   . Depressive disorder, not elsewhere classified   . Diabetes mellitus without complication (HCC)    diet controlled  . Fasting hyperglycemia   . GERD (gastroesophageal reflux disease)   . Heart murmur   . Hemoptysis    abnormal CT Chest 01/29/10 - ? new GG changes RUL > not viz on plain cxr 02/26/2010  . Mitral regurgitation    severe MR 08/2016  . [facility] [place]   1st detected 06/04/1998  . Obesity   .  OSA on CPAP    last sleep study 10 years ago  . Other and unspecified hyperlipidemia   . Positive PPD 1965   "non reactive in 2012" (10/19/2012)  . Routine general medical examination at a health care facility   . Special screening for malignant neoplasm of prostate   . Spinal stenosis, unspecified region other than cervical   . Wrist pain, left     Tobacco Use: History  Smoking Status  . Never Smoker  Smokeless Tobacco  . Never Used    Labs: Recent Review Flowsheet Data    Labs for ITP Cardiac and Pulmonary Rehab Latest Ref Rng & Units 07/24/2015 06/29/2016 08/11/2016 08/11/2016 08/31/2016   Cholestrol 0 - 200 mg/dL 246(H) 245(H) - - -   LDLCALC 0 - 99 mg/dL - 170(H) - - -   LDLDIRECT mg/dL 177.0 -  - - -   HDL >39.00 mg/dL 40.30 35.90(L) - - -   Trlycerides 0.0 - 149.0 mg/dL 211.0(H) 198.0(H) - - -   Hemoglobin A1c 4.8 - 5.6 % 6.5 6.4 - - 6.0(H)   PHART 7.350 - 7.450 - - - 7.305(L) 7.428   PCO2ART 32.0 - 48.0 mmHg - - - 46.9 35.0   HCO3 20.0 - 28.0 mmol/L - - 23.8 23.3 22.7   TCO2 0 - 100 mmol/L - - 25 25 -   ACIDBASEDEF 0.0 - 2.0 mmol/L - - 2.0 3.0(H) 1.1   O2SAT % - - 75.0 97.0 97.3      Capillary Blood Glucose: Lab Results  Component Value Date   GLUCAP 110 (H) 09/07/2016   GLUCAP 105 (H) 09/06/2016   GLUCAP 124 (H) 09/06/2016   GLUCAP 123 (H) 09/06/2016   GLUCAP 126 (H) 09/05/2016     ADL UCSD:     Pulmonary Assessment Scores    Row Name 11/07/16 1508         ADL UCSD   ADL Phase Entry     SOB Score total 43       CAT Score   CAT Score 16  Entry        Pulmonary Function Assessment:     Pulmonary Function Assessment - 10/19/16 1052      Breath   Bilateral Breath Sounds Clear   Shortness of Breath Yes      Exercise Target Goals:    Exercise Program Goal: Individual exercise prescription set with THRR, safety & activity barriers. Participant demonstrates ability to understand and report RPE using BORG scale, to self-measure pulse accurately, and to acknowledge the importance of the exercise prescription.  Exercise Prescription Goal: Starting with aerobic activity 30 plus minutes a day, 3 days per week for initial exercise prescription. Provide home exercise prescription and guidelines that participant acknowledges understanding prior to discharge.  Activity Barriers & Risk Stratification:   6 Minute Walk:     6 Minute Walk    Row Name 10/29/16 1626         6 Minute Walk   Phase Discharge     Distance 1547 feet     Walk Time 6 minutes     # of Rest Breaks 0     MPH 2.92     METS 3.22     RPE 15     Perceived Dyspnea  4     Symptoms No     Resting HR 87 bpm     Resting BP 155/83     Max Ex. HR 149 bpm  Max Ex. BP 182/84      2 Minute Post BP 159/84       Interval HR   Baseline HR 87     1 Minute HR 112     2 Minute HR 127     3 Minute HR 130     4 Minute HR 134     5 Minute HR 134     6 Minute HR 149     2 Minute Post HR 102     Interval Heart Rate? Yes       Interval Oxygen   Interval Oxygen? Yes     Baseline Oxygen Saturation % 96 %     Baseline Liters of Oxygen 0 L     1 Minute Oxygen Saturation % 95 %     1 Minute Liters of Oxygen 0 L     2 Minute Oxygen Saturation % 91 %     2 Minute Liters of Oxygen 0 L     3 Minute Oxygen Saturation % 89 %     3 Minute Liters of Oxygen 0 L     4 Minute Oxygen Saturation % 89 %     4 Minute Liters of Oxygen 0 L     5 Minute Oxygen Saturation % 88 %     5 Minute Liters of Oxygen 0 L     6 Minute Oxygen Saturation % 86 %     6 Minute Liters of Oxygen 0 L     2 Minute Post Oxygen Saturation % 96 %     2 Minute Post Liters of Oxygen 0 L        Oxygen Initial Assessment:     Oxygen Initial Assessment - 10/29/16 1631      Initial 6 min Walk   Oxygen Used None   Resting Oxygen Saturation  during 6 min walk 96 %   Exercise Oxygen Saturation  during 6 min walk 86 %  will monitor-patient ran at the end of 12mt     Program Oxygen Prescription   Program Oxygen Prescription None      Oxygen Re-Evaluation:     Oxygen Re-Evaluation    Row Name 11/10/16 0904             Program Oxygen Prescription   Program Oxygen Prescription None         Home Oxygen   Home Oxygen Device None       Sleep Oxygen Prescription None       Home Exercise Oxygen Prescription None       Home at Rest Exercise Oxygen Prescription None          Oxygen Discharge (Final Oxygen Re-Evaluation):     Oxygen Re-Evaluation - 11/10/16 0904      Program Oxygen Prescription   Program Oxygen Prescription None     Home Oxygen   Home Oxygen Device None   Sleep Oxygen Prescription None   Home Exercise Oxygen Prescription None   Home at Rest Exercise Oxygen Prescription  None      Initial Exercise Prescription:     Initial Exercise Prescription - 10/29/16 1600      Date of Initial Exercise RX and Referring Provider   Date 10/29/16   Referring Provider Dr. HReginold Agent  Level 2   Minutes 17     NuStep   Level 2   Minutes 17   METs 1.5     Track  Laps 10   Minutes 17     Prescription Details   Frequency (times per week) 1   Duration Progress to 45 minutes of aerobic exercise without signs/symptoms of physical distress     Intensity   THRR 40-80% of Max Heartrate 61-122   Ratings of Perceived Exertion 11-13   Perceived Dyspnea 0-4     Progression   Progression Continue progressive overload as per policy without signs/symptoms or physical distress.     Resistance Training   Training Prescription Yes   Weight blue bands   Reps 10-15      Perform Capillary Blood Glucose checks as needed.  Exercise Prescription Changes:     Exercise Prescription Changes    Row Name 11/05/16 1600 11/10/16 1500 11/12/16 1500 11/17/16 1500 11/19/16 1500     Response to Exercise   Blood Pressure (Admit) 136/80 120/70 140/64 136/72 136/64   Blood Pressure (Exercise) 160/90 136/70 144/76 180/90 170/80   Blood Pressure (Exit) 122/80 124/70 126/74 112/80 118/62   Heart Rate (Admit) 98 bpm 82 bpm 86 bpm 73 bpm 80 bpm   Heart Rate (Exercise) 109 bpm 121 bpm 123 bpm 119 bpm 122 bpm   Heart Rate (Exit) 90 bpm 86 bpm 77 bpm 88 bpm 84 bpm   Oxygen Saturation (Admit) 95 % 97 % 96 % 97 % 97 %   Oxygen Saturation (Exercise) 93 % 88 % 92 % 89 % 90 %   Oxygen Saturation (Exit) 95 % 96 % 96 % 95 % 96 %   Rating of Perceived Exertion (Exercise) _0 Perceived Dyspnea (Exercise) _1 Duration Progress to 45 minutes of aerobic exercise without signs/symptoms of physical distress Progress to 45 minutes of aerobic exercise without signs/symptoms of physical distress Progress to 45 minutes of aerobic exercise without signs/symptoms of  physical distress Continue with 45 min of aerobic exercise without signs/symptoms of physical distress. Continue with 45 min of aerobic exercise without signs/symptoms of physical distress.   Intensity _2      Progression   Progression Continue to progress workloads to maintain intensity without signs/symptoms of physical distress. Continue to progress workloads to maintain intensity without signs/symptoms of physical distress. Continue to progress workloads to maintain intensity without signs/symptoms of physical distress. Continue to progress workloads to maintain intensity without signs/symptoms of physical distress. Continue to progress workloads to maintain intensity without signs/symptoms of physical distress.     Resistance Training   Training Prescription _3    Weight _4    Reps 10-15 10-15 10-15 10-15 10-15   Time 10 Minutes 10 Minutes 10 Minutes 10 Minutes 10 Minutes     Interval Training   Interval Training _5      NuStep   Level _6 Minutes _7 METs 2 2.6 1.7 2.4 2.7     Arm Ergometer   Level  - 6  -  -  -   Minutes  - 17  -  -  -     Rower   Level  -  -  - 4  -   Minutes  -  -  - 17  -     Track   Laps _8 Minutes 17 17  _0 Home Exercise Plan   Plans to continue exercise at  -  - Home (comment)  -  -   Frequency  -  - Add 3 additional days to program exercise sessions.  -  -   Row Name 11/24/16 1500 11/26/16 1500 12/08/16 1500         Response to Exercise   Blood Pressure (Admit) 132/64 150/64 134/78     Blood Pressure (Exercise) 130/70 182/94 170/80     Blood Pressure (Exit) 126/68 124/72 122/60     Heart Rate (Admit) 76 bpm 81 bpm 80 bpm     Heart Rate (Exercise) 109 bpm 106 bpm 118 bpm     Heart Rate (Exit) 94 bpm 96 bpm 97 bpm     Oxygen Saturation (Admit) 96 % 96 %  96 %     Oxygen Saturation (Exercise) 94 % 95 % 93 %     Oxygen Saturation (Exit) 95 % 96 % 96 %     Rating of Perceived Exertion (Exercise) _1 Perceived Dyspnea (Exercise) _2 Duration Continue with 30 min of aerobic exercise without signs/symptoms of physical distress. Continue with 45 min of aerobic exercise without signs/symptoms of physical distress. Continue with 45 min of aerobic exercise without signs/symptoms of physical distress.     Intensity THRR unchanged THRR unchanged THRR unchanged       Progression   Progression Continue to progress workloads to maintain intensity without signs/symptoms of physical distress. Continue to progress workloads to maintain intensity without signs/symptoms of physical distress. Continue to progress workloads to maintain intensity without signs/symptoms of physical distress.       Resistance Training   Training Prescription Yes Yes Yes     Weight blue bands blue bands blue bands     Reps 10-15 10-15 10-15     Time 10 Minutes 10 Minutes 10 Minutes       Interval Training   Interval Training No No No       NuStep   Level 7  - 7     Minutes 17  - 17     METs 2.7  - 3.4       Rower   Level  - 4 4     Minutes  - 17 17       Track   Laps _3 Minutes _4 Exercise Comments:     Exercise Comments    Row Name 11/12/16 1541           Exercise Comments Home exercise completed          Exercise Goals and Review:   Exercise Goals Re-Evaluation :     Exercise Goals Re-Evaluation    Row Name 11/09/16 1144 12/04/16 1341           Exercise Goal Re-Evaluation   Exercise Goals Review Increase Physical Activity;Increase Strenth and Stamina Increase Strenth and Stamina;Increase Physical Activity      Comments Patient has only attended one exercise session. Will cont. to monitor and progress as able.  Patient is progressing well in program. Comes to each rehab session motivated to work hard.  Struggles with shortness of breath. Home exercise completed. Will cont. to monitor and progress as able.       Expected Outcomes Through exercising at rehab and at  home, patient will increase physical strength and stamina and find ADL's easier to perform.  Through exercising at rehab and at home, patient will increase physical strength and stamina and find ADL's easier to perform.          Discharge Exercise Prescription (Final Exercise Prescription Changes):     Exercise Prescription Changes - 12/08/16 1500      Response to Exercise   Blood Pressure (Admit) 134/78   Blood Pressure (Exercise) 170/80   Blood Pressure (Exit) 122/60   Heart Rate (Admit) 80 bpm   Heart Rate (Exercise) 118 bpm   Heart Rate (Exit) 97 bpm   Oxygen Saturation (Admit) 96 %   Oxygen Saturation (Exercise) 93 %   Oxygen Saturation (Exit) 96 %   Rating of Perceived Exertion (Exercise) 13   Perceived Dyspnea (Exercise) 2   Duration Continue with 45 min of aerobic exercise without signs/symptoms of physical distress.   Intensity THRR unchanged     Progression   Progression Continue to progress workloads to maintain intensity without signs/symptoms of physical distress.     Resistance Training   Training Prescription Yes   Weight blue bands   Reps 10-15   Time 10 Minutes     Interval Training   Interval Training No     NuStep   Level 7   Minutes 17   METs 3.4     Rower   Level 4   Minutes 17     Track   Laps 18   Minutes 17      Nutrition:  Target Goals: Understanding of nutrition guidelines, daily intake of sodium <1538m, cholesterol <2047m calories 30% from fat and 7% or less from saturated fats, daily to have 5 or more servings of fruits and vegetables.  Biometrics:    Nutrition Therapy Plan and Nutrition Goals:     Nutrition Therapy & Goals - 11/24/16 1505      Nutrition Therapy   Diet Carb Modified, Therapeutic Lifestyle Changes     Personal Nutrition Goals   Nutrition Goal  Pt to be able to name foods that affect blood sugar.    Personal Goal #2 Pt to maintain his wt around 220 lb until pt is ready to focus on wt loss.    Personal Goal #3 Describe the benefit of including fruits, vegetables, whole grains, and low-fat dairy products in a healthy meal plan.   Personal Goal #4 Pt to increase frequency of food consumed to at least a small snack (e.g. 1/4 cup nuts, Greek yogurt) every 5 hours.     Intervention Plan   Intervention Prescribe, educate and counsel regarding individualized specific dietary modifications aiming towards targeted core components such as weight, hypertension, lipid management, diabetes, heart failure and other comorbidities.   Expected Outcomes Short Term Goal: Understand basic principles of dietary content, such as calories, fat, sodium, cholesterol and nutrients.;Long Term Goal: Adherence to prescribed nutrition plan.      Nutrition Discharge: Rate Your Plate Scores:     Nutrition Assessments - 11/24/16 1322      Rate Your Plate Scores   Pre Score 51      Nutrition Goals Re-Evaluation:   Nutrition Goals Discharge (Final Nutrition Goals Re-Evaluation):   Psychosocial: Target Goals: Acknowledge presence or absence of significant depression and/or stress, maximize coping skills, provide positive support system. Participant is able to verbalize types and ability to use techniques and skills needed for reducing stress and depression.  Initial Review & Psychosocial Screening:  Initial Psych Review & Screening - 12/08/16 1621      Initial Review   Current issues with --      Quality of Life Scores:   PHQ-9: Recent Review Flowsheet Data    Depression screen [facility] 2/9 10/19/2016 07/29/2016 07/27/2016 07/28/2015 07/24/2015   Decreased Interest 1 0 0 0 0   Down, Depressed, Hopeless 3 0 0 0 0   PHQ - 2 Score 4 0 0 0 0   Altered sleeping 3 - - - -   Tired, decreased energy 3 - - - -   Change in appetite 3 - - - -   Feeling bad or  failure about yourself  0 - - - -   Trouble concentrating 0 - - - -   Moving slowly or fidgety/restless 0 - - - -   Suicidal thoughts 0 - - - -   PHQ-9 Score 13 - - - -   Difficult doing work/chores Not difficult at all - - - -     Interpretation of Total Score  Total Score Depression Severity:  1-4 = Minimal depression, 5-9 = Mild depression, 10-14 = Moderate depression, 15-19 = Moderately severe depression, 20-27 = Severe depression   Psychosocial Evaluation and Intervention:     Psychosocial Evaluation - 11/10/16 0907      Psychosocial Evaluation & Interventions   Interventions Encouraged to exercise with the program and follow exercise prescription      Psychosocial Re-Evaluation:     Psychosocial Re-Evaluation    Row Name 11/10/16 0908 12/08/16 1624 12/08/16 1625         Psychosocial Re-Evaluation   Current issues with None Identified  - Current Stress Concerns     Comments  - patient is discouraged with his continue shortness of breath but is beginning to accept that he may need valve surgery inorder for it to improv  -     Interventions  -  - Encouraged to attend Pulmonary Rehabilitation for the exercise  encouraged patient to re-evaluate need for valve surgery with cardiothoracic surgeon     Continue Psychosocial Services  No Follow up required  - Follow up required by staff     Comments  -  - if his shortness of breath is related to his valve then pulmonary rehab may not improve his shortness of breath as he anticipated.       Initial Review   Source of Stress Concerns  -  - Unable to participate in former interests or hobbies;Unable to perform yard/household activities        Psychosocial Discharge (Final Psychosocial Re-Evaluation):     Psychosocial Re-Evaluation - 12/08/16 1625      Psychosocial Re-Evaluation   Current issues with Current Stress Concerns   Interventions Encouraged to attend Pulmonary Rehabilitation for the exercise  encouraged patient to  re-evaluate need for valve surgery with cardiothoracic surgeon   Continue Psychosocial Services  Follow up required by staff   Comments if his shortness of breath is related to his valve then pulmonary rehab may not improve his shortness of breath as he anticipated.     Initial Review   Source of Stress Concerns Unable to participate in former interests or hobbies;Unable to perform yard/household activities      Education: Education Goals: Education classes will be provided on a weekly basis, covering required topics. Participant will state understanding/return demonstration of topics presented.  Learning Barriers/Preferences:     Learning Barriers/Preferences - 10/19/16 1049  Learning Barriers/Preferences   Learning Barriers None   Learning Preferences Audio;Computer/Internet;Pictoral;Skilled Demonstration;Verbal Instruction;Video;Written Material;Individual Instruction;Group Instruction      Education Topics: Risk Factor Reduction:  -Group instruction that is supported by a PowerPoint presentation. Instructor discusses the definition of a risk factor, different risk factors for pulmonary disease, and how the heart and lungs work together.     Nutrition for Pulmonary Patient:  -Group instruction provided by PowerPoint slides, verbal discussion, and written materials to support subject matter. The instructor gives an explanation and review of healthy diet recommendations, which includes a discussion on weight management, recommendations for fruit and vegetable consumption, as well as protein, fluid, caffeine, fiber, sodium, sugar, and alcohol. Tips for eating when patients are short of breath are discussed.   Pursed Lip Breathing:  -Group instruction that is supported by demonstration and informational handouts. Instructor discusses the benefits of pursed lip and diaphragmatic breathing and detailed demonstration on how to preform both.     Oxygen Safety:  -Group instruction  provided by PowerPoint, verbal discussion, and written material to support subject matter. There is an overview of "What is Oxygen" and "Why do we need it".  Instructor also reviews how to create a safe environment for oxygen use, the importance of using oxygen as prescribed, and the risks of noncompliance. There is a brief discussion on traveling with oxygen and resources the patient may utilize.   Oxygen Equipment:  -Group instruction provided by [facility] Staff utilizing handouts, written materials, and equipment demonstrations.   Signs and Symptoms:  -Group instruction provided by written material and verbal discussion to support subject matter. Warning signs and symptoms of infection, stroke, and heart attack are reviewed and when to call the physician/911 reinforced. Tips for preventing the spread of infection discussed.   PULMONARY REHAB OTHER RESPIRATORY from 11/26/2016 in [place]  Date  11/19/16  Educator  rn  Instruction Review Code  2- meets goals/outcomes      Advanced Directives:  -Group instruction provided by verbal instruction and written material to support subject matter. Instructor reviews Advanced Directive laws and proper instruction for filling out document.   Pulmonary Video:  -Group video education that reviews the importance of medication and oxygen compliance, exercise, good nutrition, pulmonary hygiene, and pursed lip and diaphragmatic breathing for the pulmonary patient.   Exercise for the Pulmonary Patient:  -Group instruction that is supported by a PowerPoint presentation. Instructor discusses benefits of exercise, core components of exercise, frequency, duration, and intensity of an exercise routine, importance of utilizing pulse oximetry during exercise, safety while exercising, and options of places to exercise outside of rehab.     PULMONARY REHAB OTHER RESPIRATORY from 11/26/2016 in [place]  Date  11/05/16  Educator  md  Instruction Review Code  2- meets goals/outcomes      Pulmonary Medications:  -Verbally interactive group education provided by instructor with focus on inhaled medications and proper administration.   Anatomy and Physiology of the Respiratory System and Intimacy:  -Group instruction provided by PowerPoint, verbal discussion, and written material to support subject matter. Instructor reviews respiratory cycle and anatomical components of the respiratory system and their functions. Instructor also reviews differences in obstructive and restrictive respiratory diseases with examples of each. Intimacy, Sex, and Sexuality differences are reviewed with a discussion on how relationships can change when diagnosed with pulmonary disease. Common sexual concerns are reviewed.   PULMONARY REHAB OTHER RESPIRATORY from 11/26/2016 in [place]  [place]  Date  11/26/16  Educator  rn  Instruction Review Code  2- meets goals/outcomes      MD DAY -A group question and answer session with a medical doctor that allows participants to ask questions that relate to their pulmonary disease state.   PULMONARY REHAB OTHER RESPIRATORY from 11/26/2016 in [place]  Date  11/12/16  Educator  [email or web address]  Instruction Review Code  2- meets goals/outcomes      OTHER EDUCATION -Group or individual verbal, written, or video instructions that support the educational goals of the pulmonary rehab program.   Knowledge Questionnaire Score:     Knowledge Questionnaire Score - 11/07/16 1506      Knowledge Questionnaire Score   Pre Score 12/13      Core Components/Risk Factors/Patient Goals at Admission:     Personal Goals and Risk Factors at Admission - 10/19/16 1056      Core Components/Risk Factors/Patient Goals on Admission   Improve shortness of breath with ADL's Yes   Intervention Provide education, individualized  exercise plan and daily activity instruction to help decrease symptoms of SOB with activities of daily living.   Expected Outcomes Short Term: Achieves a reduction of symptoms when performing activities of daily living.   Develop more efficient breathing techniques such as purse lipped breathing and diaphragmatic breathing; and practicing self-pacing with activity Yes   Increase knowledge of respiratory medications and ability to use respiratory devices properly  Yes   Intervention Provide education and demonstration as needed of appropriate use of medications, inhalers, and oxygen therapy.   Expected Outcomes Short Term: Achieves understanding of medications use. Understands that oxygen is a medication prescribed by physician. Demonstrates appropriate use of inhaler and oxygen therapy.      Core Components/Risk Factors/Patient Goals Review:      Goals and Risk Factor Review    Row Name 11/10/16 0905 12/08/16 1617           Core Components/Risk Factors/Patient Goals Review   Personal Goals Review Improve shortness of breath with ADL's;Increase knowledge of respiratory medications and ability to use respiratory devices properly.;Develop more efficient breathing techniques such as purse lipped breathing and diaphragmatic breathing and practicing self-pacing with activity. Weight Management/Obesity;Improve shortness of breath with ADL's;Increase knowledge of respiratory medications and ability to use respiratory devices properly.;Develop more efficient breathing techniques such as purse lipped breathing and diaphragmatic breathing and practicing self-pacing with activity.      Review Has only attended 1 exercise session, too early to have met any goals patient has progressed well over the past 30 days in pulmonary rehab. he has tolerated workload increases although is limited by his shortness of breath. This may not be related to any pulmonary illness but to his need for a valve replacement. he is  utilizing PLB when appropriate. He is beginning to ask question regarding the effects of a round abdomen on breathing and has realized that if he can loose some weight his breathing may improve somewhat.       Expected Outcomes expect to see more progression of goals in the next 30 days expect to see more progression of goals in the next 30 days         Core Components/Risk Factors/Patient Goals at Discharge (Final Review):      Goals and Risk Factor Review - 12/08/16 1617      Core Components/Risk Factors/Patient Goals Review   Personal Goals Review Weight Management/Obesity;Improve shortness of breath  with ADL's;Increase knowledge of respiratory medications and ability to use respiratory devices properly.;Develop more efficient breathing techniques such as purse lipped breathing and diaphragmatic breathing and practicing self-pacing with activity.   Review patient has progressed well over the past 30 days in pulmonary rehab. he has tolerated workload increases although is limited by his shortness of breath. This may not be related to any pulmonary illness but to his need for a valve replacement. he is utilizing PLB when appropriate. He is beginning to ask question regarding the effects of a round abdomen on breathing and has realized that if he can loose some weight his breathing may improve somewhat.    Expected Outcomes expect to see more progression of goals in the next 30 days      ITP Comments:   Comments: ITP REVIEW Pt is making expected progress toward most pulmonary rehab goals after completing 8 sessions. Recommend continued exercise, life style modification, education, and utilization of breathing techniques to increase stamina and strength and decrease shortness of breath with exertion.

## 2016-12-10 ENCOUNTER — Encounter (HOSPITAL_COMMUNITY)
Admission: RE | Admit: 2016-12-10 | Discharge: 2016-12-10 | Disposition: A | Discharge status: Home / Self Care | Payer: Medicare Other | Source: Ambulatory Visit | Attending: Thoracic Surgery (Cardiothoracic Vascular Surgery) | Admitting: Thoracic Surgery (Cardiothoracic Vascular Surgery)

## 2016-12-10 VITALS — Wt 225.1 lb

## 2016-12-10 DIAGNOSIS — Z79899 Other long term (current) drug therapy: Secondary | ICD-10-CM | POA: Insufficient documentation

## 2016-12-10 DIAGNOSIS — F419 Anxiety disorder, unspecified: Secondary | ICD-10-CM | POA: Insufficient documentation

## 2016-12-10 DIAGNOSIS — G4733 Obstructive sleep apnea (adult) (pediatric): Secondary | ICD-10-CM | POA: Diagnosis not present

## 2016-12-10 DIAGNOSIS — Z7982 Long term (current) use of aspirin: Secondary | ICD-10-CM | POA: Insufficient documentation

## 2016-12-10 DIAGNOSIS — F329 Major depressive disorder, single episode, unspecified: Secondary | ICD-10-CM | POA: Diagnosis not present

## 2016-12-10 DIAGNOSIS — E119 Type 2 diabetes mellitus without complications: Secondary | ICD-10-CM | POA: Diagnosis not present

## 2016-12-10 DIAGNOSIS — Z7901 Long term (current) use of anticoagulants: Secondary | ICD-10-CM | POA: Insufficient documentation

## 2016-12-10 DIAGNOSIS — I2510 Atherosclerotic heart disease of native coronary artery without angina pectoris: Secondary | ICD-10-CM | POA: Insufficient documentation

## 2016-12-10 DIAGNOSIS — E784 Other hyperlipidemia: Secondary | ICD-10-CM | POA: Insufficient documentation

## 2016-12-10 DIAGNOSIS — C3491 Malignant neoplasm of unspecified part of right bronchus or lung: Secondary | ICD-10-CM | POA: Diagnosis not present

## 2016-12-10 DIAGNOSIS — K219 Gastro-esophageal reflux disease without esophagitis: Secondary | ICD-10-CM | POA: Diagnosis not present

## 2016-12-10 NOTE — Progress Notes (Signed)
Daily Session Note  Patient Details  Name: [name] MRN: [id number] Date of Birth: [birth year] Referring Provider:     Pulmonary Rehab Walk Test from 10/29/2016 in [place]  Referring Provider  Dr. [name]      Encounter Date: 12/10/2016  Check In:     Session Check In - 12/10/16 1343      Check-In   Location [facility]   Staff Present Su Hilt, MS, ACSM RCEP, Exercise Physiologist;Lisa Ysidro Evert, RN;Camrie Stock Rollene Rotunda, RN, BSN   Supervising physician immediately available to respond to emergencies [facility] immediately available   Physician(s) Dr. Jonnie Finner   Medication changes reported     No   Fall or balance concerns reported    No   Tobacco Cessation No Change   Warm-up and Cool-down Performed as group-led instruction   Resistance Training Performed Yes   VAD Patient? No     Pain Assessment   Currently in Pain? No/denies   Multiple Pain Sites No      Capillary Blood Glucose: No results found for this or any previous visit (from the past 24 hour(s)).      Exercise Prescription Changes - 12/10/16 1556      Response to Exercise   Blood Pressure (Admit) 136/64   Blood Pressure (Exercise) 126/74   Blood Pressure (Exit) 120/66   Heart Rate (Admit) 79 bpm   Heart Rate (Exercise) 109 bpm   Heart Rate (Exit) 82 bpm   Oxygen Saturation (Admit) 97 %   Oxygen Saturation (Exercise) 93 %   Oxygen Saturation (Exit) 95 %   Rating of Perceived Exertion (Exercise) 12   Perceived Dyspnea (Exercise) 2   Duration Continue with 45 min of aerobic exercise without signs/symptoms of physical distress.   Intensity THRR unchanged     Progression   Progression Continue to progress workloads to maintain intensity without signs/symptoms of physical distress.     Resistance Training   Training Prescription Yes   Weight blue bands   Reps 10-15   Time 10 Minutes     Interval Training   Interval Training No     NuStep   Level 7   Minutes 17   METs 3.4     Arm/Foot Ergometer   Level 7   Minutes 17   METs 3.1     Track   Laps 9   Minutes 17      History  Smoking Status  . Never Smoker  Smokeless Tobacco  . Never Used    Goals Met:  Exercise tolerated well Queuing for purse lip breathing No report of cardiac concerns or symptoms Strength training completed today  Goals Unmet:  Not Applicable  Comments: Service time is from 1330 to 1515   Dr. Rush Farmer is Medical Director for Pulmonary Rehab at [facility].

## 2016-12-14 ENCOUNTER — Other Ambulatory Visit: Payer: Self-pay | Admitting: Physician Assistant

## 2016-12-15 ENCOUNTER — Encounter (HOSPITAL_COMMUNITY)
Admission: RE | Admit: 2016-12-15 | Discharge: 2016-12-15 | Disposition: A | Discharge status: Home / Self Care | Payer: Medicare Other | Source: Ambulatory Visit | Attending: Thoracic Surgery (Cardiothoracic Vascular Surgery) | Admitting: Thoracic Surgery (Cardiothoracic Vascular Surgery)

## 2016-12-15 VITALS — Wt 224.9 lb

## 2016-12-15 DIAGNOSIS — C3491 Malignant neoplasm of unspecified part of right bronchus or lung: Secondary | ICD-10-CM | POA: Diagnosis not present

## 2016-12-15 NOTE — Progress Notes (Signed)
Daily Session Note  Patient Details  Name: James [name] MRN: [id number] Date of Birth: March 01, 1951 Referring Provider:     Pulmonary Rehab Walk Test from 10/29/2016 in [place]  Referring Provider  Dr. [name]      Encounter Date: 12/15/2016  Check In:   Capillary Blood Glucose: No results found for this or any previous visit (from the past 24 hour(s)).      Exercise Prescription Changes - 12/15/16 1500      Response to Exercise   Blood Pressure (Admit) 160/84   Blood Pressure (Exercise) 186/82   Blood Pressure (Exit) 128/72   Heart Rate (Admit) 80 bpm   Heart Rate (Exercise) 122 bpm   Heart Rate (Exit) 95 bpm   Oxygen Saturation (Admit) 97 %   Oxygen Saturation (Exercise) 87 %   Oxygen Saturation (Exit) 96 %   Rating of Perceived Exertion (Exercise) 13   Perceived Dyspnea (Exercise) 3   Duration Continue with 45 min of aerobic exercise without signs/symptoms of physical distress.   Intensity THRR unchanged     Progression   Progression Continue to progress workloads to maintain intensity without signs/symptoms of physical distress.     Resistance Training   Training Prescription Yes   Weight blue bands   Reps 10-15   Time 10 Minutes     Interval Training   Interval Training No     NuStep   Level 7   Minutes 17   METs 3.4     Rower   Level 4   Minutes 17     Track   Laps 21   Minutes 17      History  Smoking Status  . Never Smoker  Smokeless Tobacco  . Never Used    Goals Met:  Exercise tolerated well No report of cardiac concerns or symptoms Strength training completed today  Goals Unmet:  Not Applicable  Comments: Service time is from 1:30p to 3:10p    Dr. Rush Farmer is Medical Director for Pulmonary Rehab at [facility].

## 2016-12-17 ENCOUNTER — Encounter (HOSPITAL_COMMUNITY)
Admission: RE | Admit: 2016-12-17 | Discharge: 2016-12-17 | Disposition: A | Discharge status: Home / Self Care | Payer: Medicare Other | Source: Ambulatory Visit | Attending: Thoracic Surgery (Cardiothoracic Vascular Surgery) | Admitting: Thoracic Surgery (Cardiothoracic Vascular Surgery)

## 2016-12-17 VITALS — Wt 223.8 lb

## 2016-12-17 DIAGNOSIS — C3491 Malignant neoplasm of unspecified part of right bronchus or lung: Secondary | ICD-10-CM

## 2016-12-17 NOTE — Progress Notes (Signed)
Daily Session Note  Patient Details  Name: James [name] MRN: [id number] Date of Birth: 04/21/1951 Referring Provider:     Pulmonary Rehab Walk Test from 10/29/2016 in [facility]  Referring Provider  Dr. Hendrickson      Encounter Date: 12/17/2016  Check In:     Session Check In - 12/17/16 1339      Check-In   Location [facility]   Staff Present Molly diVincenzo, MS, ACSM RCEP, Exercise Physiologist;Lisa Hughes, RN;Portia Payne, RN, BSN   Supervising physician immediately available to respond to emergencies [facility] immediately available   Physician(s) Dr. Dhungel   Medication changes reported     No   Fall or balance concerns reported    No   Tobacco Cessation No Change   Warm-up and Cool-down Performed as group-led instruction   Resistance Training Performed Yes   VAD Patient? No     Pain Assessment   Currently in Pain? No/denies   Multiple Pain Sites No      Capillary Blood Glucose: No results found for this or any previous visit (from the past 24 hour(s)).      Exercise Prescription Changes - 12/17/16 1606      Response to Exercise   Blood Pressure (Admit) 142/70   Blood Pressure (Exercise) 140/70   Blood Pressure (Exit) 118/70   Heart Rate (Admit) 95 bpm   Heart Rate (Exercise) 101 bpm   Heart Rate (Exit) 89 bpm   Oxygen Saturation (Admit) 96 %   Oxygen Saturation (Exercise) 96 %   Oxygen Saturation (Exit) 95 %   Rating of Perceived Exertion (Exercise) 12   Perceived Dyspnea (Exercise) 2   Duration Continue with 45 min of aerobic exercise without signs/symptoms of physical distress.   Intensity THRR unchanged     Progression   Progression Continue to progress workloads to maintain intensity without signs/symptoms of physical distress.     Resistance Training   Training Prescription Yes   Weight blue bands   Reps 10-15   Time 10 Minutes     Interval Training   Interval Training No     NuStep   Level 7   Minutes 17   METs 4.4     Rower   Level 4   Minutes 17      History  Smoking Status  . Never Smoker  Smokeless Tobacco  . Never Used    Goals Met:  Independence with exercise equipment Improved SOB with ADL's Changing diet to healthy choices, watching portion sizes Exercise tolerated well No report of cardiac concerns or symptoms Strength training completed today  Goals Unmet:  Not Applicable  Comments: Service time is from 1330 to 1530   Dr. Wesam G. Yacoub is Medical Director for Pulmonary Rehab at [facility]. 

## 2016-12-18 ENCOUNTER — Other Ambulatory Visit: Payer: Self-pay

## 2016-12-18 ENCOUNTER — Other Ambulatory Visit: Payer: Self-pay | Admitting: Physician Assistant

## 2016-12-18 NOTE — Telephone Encounter (Signed)
RX refills called into [facility]. Xarelto 20 mg po qd #30/ 4 refills And Potassium 20 meg po qd #30/4refills/ under Dr [name]

## 2016-12-22 ENCOUNTER — Encounter (HOSPITAL_COMMUNITY)
Admission: RE | Admit: 2016-12-22 | Discharge: 2016-12-22 | Disposition: A | Discharge status: Home / Self Care | Payer: Medicare Other | Source: Ambulatory Visit | Attending: Thoracic Surgery (Cardiothoracic Vascular Surgery) | Admitting: Thoracic Surgery (Cardiothoracic Vascular Surgery)

## 2016-12-22 VITALS — Wt 224.0 lb

## 2016-12-22 DIAGNOSIS — C3491 Malignant neoplasm of unspecified part of right bronchus or lung: Secondary | ICD-10-CM

## 2016-12-22 NOTE — Progress Notes (Signed)
Daily Session Note  Patient Details  Name: James [name] MRN: [id number] Date of Birth: [birth year] Referring Provider:     Pulmonary Rehab Walk Test from 10/29/2016 in [place]  Referring Provider  Dr. [name]      Encounter Date: 12/22/2016  Check In:     Session Check In - 12/22/16 1531      Check-In   Location [facility]   Staff Present Rodney Langton, RN;Molly diVincenzo, MS, ACSM RCEP, Exercise Physiologist;Annedrea Rosezella [place], RN, MHA;Olinty Celesta Aver, MS, ACSM CEP, Exercise Physiologist   Supervising physician immediately available to respond to emergencies [facility] immediately available   Physician(s) Dr. Clementeen Graham   Medication changes reported     No   Fall or balance concerns reported    No   Tobacco Cessation No Change   Warm-up and Cool-down Performed as group-led instruction   Resistance Training Performed Yes   VAD Patient? No     Pain Assessment   Currently in Pain? No/denies      Capillary Blood Glucose: No results found for this or any previous visit (from the past 24 hour(s)).      Exercise Prescription Changes - 12/22/16 1500      Response to Exercise   Blood Pressure (Admit) 140/76   Blood Pressure (Exercise) 154/80   Blood Pressure (Exit) 122/62   Heart Rate (Admit) 82 bpm   Heart Rate (Exercise) 121 bpm   Heart Rate (Exit) 88 bpm   Oxygen Saturation (Admit) 96 %   Oxygen Saturation (Exercise) 93 %   Oxygen Saturation (Exit) 95 %   Rating of Perceived Exertion (Exercise) 12   Perceived Dyspnea (Exercise) 2   Duration Continue with 45 min of aerobic exercise without signs/symptoms of physical distress.   Intensity THRR unchanged     Progression   Progression Continue to progress workloads to maintain intensity without signs/symptoms of physical distress.     Resistance Training   Training Prescription Yes   Weight blue bands   Reps 10-15   Time 10 Minutes     Interval Training    Interval Training No     NuStep   Level 7   Minutes 17   METs 5.1     Rower   Level 4   Minutes 17     Track   Laps 17   Minutes 17      History  Smoking Status  . Never Smoker  Smokeless Tobacco  . Never Used    Goals Met:  Exercise tolerated well No report of cardiac concerns or symptoms Strength training completed today  Goals Unmet:  Not Applicable  Comments: Service time is from 1330 to 1500    Dr. Rush Farmer is Medical Director for Pulmonary Rehab at [facility]

## 2016-12-24 ENCOUNTER — Encounter (HOSPITAL_COMMUNITY)
Admission: RE | Admit: 2016-12-24 | Discharge: 2016-12-24 | Disposition: A | Discharge status: Home / Self Care | Payer: Medicare Other | Source: Ambulatory Visit | Attending: Thoracic Surgery (Cardiothoracic Vascular Surgery) | Admitting: Thoracic Surgery (Cardiothoracic Vascular Surgery)

## 2016-12-24 VITALS — Wt 225.8 lb

## 2016-12-24 DIAGNOSIS — C3491 Malignant neoplasm of unspecified part of right bronchus or lung: Secondary | ICD-10-CM | POA: Diagnosis not present

## 2016-12-24 NOTE — Progress Notes (Signed)
Daily Session Note  Patient Details  Name: James [name] MRN: [id number] Date of Birth: [birth year] Referring Provider:     Pulmonary Rehab Walk Test from 10/29/2016 in [place]  Referring Provider  Dr. [name]      Encounter Date: 12/24/2016  Check In:     Session Check In - 12/24/16 1533      Check-In   Location [facility]   Staff Present Trish Fountain, RN, BSN;Molly diVincenzo, MS, ACSM RCEP, Exercise Physiologist;Emily Forse Ysidro Evert, RN   Supervising physician immediately available to respond to emergencies [facility] immediately available   Physician(s) Dr. Wendee Beavers   Medication changes reported     No   Fall or balance concerns reported    No   Tobacco Cessation No Change   Warm-up and Cool-down Performed as group-led instruction   Resistance Training Performed Yes   VAD Patient? No     Pain Assessment   Currently in Pain? No/denies   Multiple Pain Sites No      Capillary Blood Glucose: No results found for this or any previous visit (from the past 24 hour(s)).      Exercise Prescription Changes - 12/24/16 1500      Response to Exercise   Blood Pressure (Admit) 146/70   Blood Pressure (Exercise) 144/70   Blood Pressure (Exit) 110/60   Heart Rate (Admit) 84 bpm   Heart Rate (Exercise) 132 bpm   Heart Rate (Exit) 99 bpm   Oxygen Saturation (Admit) 96 %   Oxygen Saturation (Exercise) 91 %   Oxygen Saturation (Exit) 94 %   Rating of Perceived Exertion (Exercise) 12   Perceived Dyspnea (Exercise) 3   Duration Continue with 45 min of aerobic exercise without signs/symptoms of physical distress.   Intensity THRR unchanged     Progression   Progression Continue to progress workloads to maintain intensity without signs/symptoms of physical distress.     Resistance Training   Training Prescription Yes   Weight blue bands   Reps 10-15   Time 10 Minutes     Interval Training   Interval Training No     NuStep    Level 7   Minutes 17   METs 3.3     Track   Laps 35   Minutes 34      History  Smoking Status  . Never Smoker  Smokeless Tobacco  . Never Used    Goals Met:  Exercise tolerated well No report of cardiac concerns or symptoms Strength training completed today  Goals Unmet:  Not Applicable  Comments: Service time is from 1330 to 1500    Dr. Rush Farmer is Medical Director for Pulmonary Rehab at [facility].

## 2016-12-29 ENCOUNTER — Encounter (HOSPITAL_COMMUNITY)
Admission: RE | Admit: 2016-12-29 | Discharge: 2016-12-29 | Disposition: A | Discharge status: Home / Self Care | Payer: Medicare Other | Source: Ambulatory Visit | Attending: Thoracic Surgery (Cardiothoracic Vascular Surgery) | Admitting: Thoracic Surgery (Cardiothoracic Vascular Surgery)

## 2016-12-29 DIAGNOSIS — C3491 Malignant neoplasm of unspecified part of right bronchus or lung: Secondary | ICD-10-CM | POA: Diagnosis not present

## 2016-12-30 ENCOUNTER — Telehealth: Payer: Self-pay

## 2016-12-30 NOTE — Telephone Encounter (Signed)
Spoke with patient concerning upcomming appointment for 11/28

## 2016-12-31 ENCOUNTER — Encounter (HOSPITAL_COMMUNITY)
Admission: RE | Admit: 2016-12-31 | Discharge: 2016-12-31 | Disposition: A | Discharge status: Home / Self Care | Payer: Medicare Other | Source: Ambulatory Visit | Attending: Thoracic Surgery (Cardiothoracic Vascular Surgery) | Admitting: Thoracic Surgery (Cardiothoracic Vascular Surgery)

## 2016-12-31 VITALS — Wt 226.2 lb

## 2016-12-31 DIAGNOSIS — C3491 Malignant neoplasm of unspecified part of right bronchus or lung: Secondary | ICD-10-CM

## 2016-12-31 NOTE — Progress Notes (Signed)
Daily Session Note  Patient Details  Name: James [name] MRN: [id number] Date of Birth: March 28, 1951 Referring Provider:     Pulmonary Rehab Walk Test from 10/29/2016 in [place]  Referring Provider  Dr. [name]      Encounter Date: 12/31/2016  Check In:     Session Check In - 12/31/16 1337      Check-In   Location [facility]   Staff Present Su Hilt, MS, ACSM RCEP, Exercise Physiologist;Shadee Rathod Ysidro Evert, RN;Portia Rollene Rotunda, RN, BSN   Supervising physician immediately available to respond to emergencies [facility] immediately available   Physician(s) Dr. Clementeen Graham   Medication changes reported     No   Fall or balance concerns reported    No   Tobacco Cessation No Change   Warm-up and Cool-down Performed as group-led instruction   Resistance Training Performed Yes   VAD Patient? No     Pain Assessment   Currently in Pain? No/denies   Multiple Pain Sites No      Capillary Blood Glucose: No results found for this or any previous visit (from the past 24 hour(s)).      Exercise Prescription Changes - 12/31/16 1500      Response to Exercise   Blood Pressure (Admit) 138/66   Blood Pressure (Exercise) 174/82   Blood Pressure (Exit) 128/74   Heart Rate (Admit) 83 bpm   Heart Rate (Exercise) 115 bpm   Heart Rate (Exit) 83 bpm   Oxygen Saturation (Admit) 95 %   Oxygen Saturation (Exercise) 89 %   Oxygen Saturation (Exit) 94 %   Rating of Perceived Exertion (Exercise) 12   Perceived Dyspnea (Exercise) 2   Duration Continue with 45 min of aerobic exercise without signs/symptoms of physical distress.   Intensity THRR unchanged     Progression   Progression Continue to progress workloads to maintain intensity without signs/symptoms of physical distress.     Resistance Training   Training Prescription Yes   Weight blue bands   Reps 10-15   Time 10 Minutes     Interval Training   Interval Training No     NuStep   Level 7   Minutes 17   METs 3.7     Track   Laps 16   Minutes 17      History  Smoking Status  . Never Smoker  Smokeless Tobacco  . Never Used    Goals Met:  Exercise tolerated well No report of cardiac concerns or symptoms Strength training completed today  Goals Unmet:  Not Applicable  Comments: Service time is from 1330 to 1535    Dr. Rush Farmer is Medical Director for Pulmonary Rehab at [facility].

## 2016-12-31 NOTE — Addendum Note (Signed)
Addendum  created 12/31/16 1330 by Roberts Gaudy, MD   Sign clinical note

## 2017-01-05 ENCOUNTER — Encounter (HOSPITAL_COMMUNITY)
Admission: RE | Admit: 2017-01-05 | Discharge: 2017-01-05 | Disposition: A | Discharge status: Home / Self Care | Payer: Medicare Other | Source: Ambulatory Visit | Attending: Thoracic Surgery (Cardiothoracic Vascular Surgery) | Admitting: Thoracic Surgery (Cardiothoracic Vascular Surgery)

## 2017-01-05 VITALS — Wt 225.8 lb

## 2017-01-05 DIAGNOSIS — C3491 Malignant neoplasm of unspecified part of right bronchus or lung: Secondary | ICD-10-CM | POA: Diagnosis not present

## 2017-01-05 NOTE — Progress Notes (Signed)
Daily Session Note  Patient Details  Name: James [name] MRN: [id number] Date of Birth: 1950-11-03 Referring Provider:     Pulmonary Rehab Walk Test from 10/29/2016 in [place]  Referring Provider  Dr. [name]      Encounter Date: 01/05/2017  Check In:     Session Check In - 01/05/17 1330      Check-In   Location [facility]   Staff Present Kathline Magic, MS, ACSM RCEP, Exercise Physiologist;Aleksa Catterton Rollene Rotunda, RN, BSN   Supervising physician immediately available to respond to emergencies [facility] immediately available   Physician(s) Dr. Clementeen Graham   Medication changes reported     No   Fall or balance concerns reported    No   Tobacco Cessation No Change   Warm-up and Cool-down Performed as group-led instruction   Resistance Training Performed Yes   VAD Patient? No     Pain Assessment   Currently in Pain? No/denies   Multiple Pain Sites No      Capillary Blood Glucose: No results found for this or any previous visit (from the past 24 hour(s)).      Exercise Prescription Changes - 01/05/17 1608      Response to Exercise   Blood Pressure (Admit) 126/60   Blood Pressure (Exercise) 162/82   Blood Pressure (Exit) 134/68   Heart Rate (Admit) 80 bpm   Heart Rate (Exercise) 125 bpm   Heart Rate (Exit) 96 bpm   Oxygen Saturation (Admit) 96 %   Oxygen Saturation (Exercise) 90 %   Oxygen Saturation (Exit) 95 %   Rating of Perceived Exertion (Exercise) 13   Perceived Dyspnea (Exercise) 3   Duration Continue with 45 min of aerobic exercise without signs/symptoms of physical distress.   Intensity THRR unchanged     Progression   Progression Continue to progress workloads to maintain intensity without signs/symptoms of physical distress.     Resistance Training   Training Prescription Yes   Weight blue bands   Reps 10-15   Time 10 Minutes     Interval Training   Interval Training No     NuStep   Level 7   Minutes 17   METs 3.8     Track   Laps 39   Minutes 34      History  Smoking Status  . Never Smoker  Smokeless Tobacco  . Never Used    Goals Met:  Independence with exercise equipment Improved SOB with ADL's Using PLB without cueing & demonstrates good technique Exercise tolerated well No report of cardiac concerns or symptoms Strength training completed today  Goals Unmet:  Not Applicable  Comments: Service time is from 1330 to 1500   Dr. Rush Farmer is Medical Director for Pulmonary Rehab at [facility].

## 2017-01-06 NOTE — Progress Notes (Signed)
Pulmonary Individual Treatment Plan  Patient Details  Name: James [name] MRN: [id number] Date of Birth: 1950/07/04 Referring Provider:     Pulmonary Rehab Walk Test from 10/29/2016 in [place]  Referring Provider  Dr. [name]      Initial Encounter Date:    Pulmonary Rehab Walk Test from 10/29/2016 in [place]  Date  10/29/16  Referring Provider  Dr. [name]      Visit Diagnosis: Adenocarcinoma of right lung [facility])  Patient's Home Medications on Admission:   Current Outpatient Prescriptions:  .  acetaminophen (TYLENOL) 325 MG tablet, Take 650 mg by mouth every 6 (six) hours as needed for moderate pain or headache., Disp: , Rfl:  .  aspirin EC 81 MG tablet, Take 1 tablet (81 mg total) by mouth daily., Disp: 90 tablet, Rfl: 3 .  atorvastatin (LIPITOR) 20 MG tablet, Take 1 tablet (20 mg total) by mouth daily., Disp: 90 tablet, Rfl: 3 .  FLAXSEED, LINSEED, PO, Take 20 mLs by mouth daily. Sprinkled in drink daily, Disp: , Rfl:  .  furosemide (LASIX) 40 MG tablet, TAKE 1 TABLET BY MOUTH EVERY DAY, Disp: 90 tablet, Rfl: 3 .  Multiple Vitamin (MULTIVITAMIN) tablet, Take 1 tablet by mouth daily., Disp: , Rfl:  .  Omega-3 Fatty Acids (FISH OIL) 500 MG CAPS, Take 500 mg by mouth daily. , Disp: , Rfl:  .  potassium chloride SA (K-DUR,KLOR-CON) 20 MEQ tablet, Take 1 tablet (20 mEq total) by mouth daily., Disp: 30 tablet, Rfl: 1 .  psyllium (METAMUCIL) 58.6 % packet, Take 1 packet by mouth daily., Disp: , Rfl:  .  rivaroxaban (XARELTO) 20 MG TABS tablet, Take 1 tablet (20 mg total) by mouth daily with supper., Disp: 30 tablet, Rfl: 1 .  Rivaroxaban 15 & 20 MG TBPK, Take as directed on package: Start with one 108m tablet by mouth twice a day with food. On Day 22, switch to one 249mtablet once a day with food., Disp: 51 each, Rfl: 0 .  sodium chloride (OCEAN) 0.65 % SOLN nasal spray, Place 2 sprays into both nostrils at  bedtime., Disp: , Rfl:  .  Tetrahydrozoline-Zn Sulfate (ALLERGY RELIEF EYE DROPS OP), Apply 2 drops to eye daily as needed (allergy)., Disp: , Rfl:  .  traMADol (ULTRAM) 50 MG tablet, Take 50 mg by mouth every 4-6 hours PRN severe pain. (Patient taking differently: Take 50 mg by mouth every 4 (four) hours as needed for moderate pain. ), Disp: 30 tablet, Rfl: 0 .  Undecylenic Ac-Zn Undecylenate (FUNGI-NAIL TOE & FOOT EX), Apply 1 application topically daily., Disp: , Rfl:   Past Medical History: Past Medical History:  Diagnosis Date  . Adenocarcinoma of right lung, stage 1 ([facility]4/29/2018  . Anxiety   . Arthritis    "knees, hips, back" (10/19/2012)  . Chronic lower back pain   . Colon polyps    10/27/2002, repeat letter 09/17/2007  . Coronary artery disease   . Depressive disorder, not elsewhere classified   . Diabetes mellitus without complication (HCC)    diet controlled  . Fasting hyperglycemia   . GERD (gastroesophageal reflux disease)   . Heart murmur   . Hemoptysis    abnormal CT Chest 01/29/10 - ? new GG changes RUL > not viz on plain cxr 02/26/2010  . Mitral regurgitation    severe MR 08/2016  . [facility] [place]   1st detected 06/04/1998  . Obesity   .  OSA on CPAP    last sleep study 10 years ago  . Other and unspecified hyperlipidemia   . Positive PPD 1965   "non reactive in 2012" (10/19/2012)  . Routine general medical examination at a health care facility   . Special screening for malignant neoplasm of prostate   . Spinal stenosis, unspecified region other than cervical   . Wrist pain, left     Tobacco Use: History  Smoking Status  . Never Smoker  Smokeless Tobacco  . Never Used    Labs: Recent Review Flowsheet Data    Labs for ITP Cardiac and Pulmonary Rehab Latest Ref Rng & Units 07/24/2015 06/29/2016 08/11/2016 08/11/2016 08/31/2016   Cholestrol 0 - 200 mg/dL 246(H) 245(H) - - -   LDLCALC 0 - 99 mg/dL - 170(H) - - -   LDLDIRECT mg/dL 177.0 -  - - -   HDL >39.00 mg/dL 40.30 35.90(L) - - -   Trlycerides 0.0 - 149.0 mg/dL 211.0(H) 198.0(H) - - -   Hemoglobin A1c 4.8 - 5.6 % 6.5 6.4 - - 6.0(H)   PHART 7.350 - 7.450 - - - 7.305(L) 7.428   PCO2ART 32.0 - 48.0 mmHg - - - 46.9 35.0   HCO3 20.0 - 28.0 mmol/L - - 23.8 23.3 22.7   TCO2 0 - 100 mmol/L - - 25 25 -   ACIDBASEDEF 0.0 - 2.0 mmol/L - - 2.0 3.0(H) 1.1   O2SAT % - - 75.0 97.0 97.3      Capillary Blood Glucose: Lab Results  Component Value Date   GLUCAP 110 (H) 09/07/2016   GLUCAP 105 (H) 09/06/2016   GLUCAP 124 (H) 09/06/2016   GLUCAP 123 (H) 09/06/2016   GLUCAP 126 (H) 09/05/2016     Pulmonary Assessment Scores:     Pulmonary Assessment Scores    Row Name 11/07/16 1508         ADL UCSD   ADL Phase Entry     SOB Score total 43       CAT Score   CAT Score 16  Entry        Pulmonary Function Assessment:     Pulmonary Function Assessment - 10/19/16 1052      Breath   Bilateral Breath Sounds Clear   Shortness of Breath Yes      Exercise Target Goals:    Exercise Program Goal: Individual exercise prescription set with THRR, safety & activity barriers. Participant demonstrates ability to understand and report RPE using BORG scale, to self-measure pulse accurately, and to acknowledge the importance of the exercise prescription.  Exercise Prescription Goal: Starting with aerobic activity 30 plus minutes a day, 3 days per week for initial exercise prescription. Provide home exercise prescription and guidelines that participant acknowledges understanding prior to discharge.  Activity Barriers & Risk Stratification:   6 Minute Walk:     6 Minute Walk    Row Name 10/29/16 1626         6 Minute Walk   Phase Discharge     Distance 1547 feet     Walk Time 6 minutes     # of Rest Breaks 0     MPH 2.92     METS 3.22     RPE 15     Perceived Dyspnea  4     Symptoms No     Resting HR 87 bpm     Resting BP 155/83     Max Ex. HR 149 bpm  Max Ex. BP 182/84     2 Minute Post BP 159/84       Interval HR   Baseline HR (retired) 87     1 Minute HR 112     2 Minute HR 127     3 Minute HR 130     4 Minute HR 134     5 Minute HR 134     6 Minute HR 149     2 Minute Post HR 102     Interval Heart Rate? Yes       Interval Oxygen   Interval Oxygen? Yes     Baseline Oxygen Saturation % 96 %     Resting Liters of Oxygen 0 L     1 Minute Oxygen Saturation % 95 %     1 Minute Liters of Oxygen 0 L     2 Minute Oxygen Saturation % 91 %     2 Minute Liters of Oxygen 0 L     3 Minute Oxygen Saturation % 89 %     3 Minute Liters of Oxygen 0 L     4 Minute Oxygen Saturation % 89 %     4 Minute Liters of Oxygen 0 L     5 Minute Oxygen Saturation % 88 %     5 Minute Liters of Oxygen 0 L     6 Minute Oxygen Saturation % 86 %     6 Minute Liters of Oxygen 0 L     2 Minute Post Oxygen Saturation % 96 %     2 Minute Post Liters of Oxygen 0 L        Oxygen Initial Assessment:     Oxygen Initial Assessment - 10/29/16 1631      Initial 6 min Walk   Oxygen Used None   Resting Oxygen Saturation  96 %   Exercise Oxygen Saturation  during 6 min walk 86 %  will monitor-patient ran at the end of 35mt     Program Oxygen Prescription   Program Oxygen Prescription None      Oxygen Re-Evaluation:     Oxygen Re-Evaluation    Row Name 11/10/16 0478208/29/18 2129           Program Oxygen Prescription   Program Oxygen Prescription None None        Home Oxygen   Home Oxygen Device None None      Sleep Oxygen Prescription None None      Home Exercise Oxygen Prescription None None      Home at Rest Exercise Oxygen Prescription None None         Oxygen Discharge (Final Oxygen Re-Evaluation):     Oxygen Re-Evaluation - 01/06/17 2129      Program Oxygen Prescription   Program Oxygen Prescription None     Home Oxygen   Home Oxygen Device None   Sleep Oxygen Prescription None   Home Exercise Oxygen Prescription None    Home at Rest Exercise Oxygen Prescription None      Initial Exercise Prescription:     Initial Exercise Prescription - 10/29/16 1600      Date of Initial Exercise RX and Referring Provider   Date 10/29/16   Referring Provider Dr. HReginold Agent  Level 2   Minutes 17     NuStep   Level 2   Minutes 17   METs 1.5     Track   Laps 10  Minutes 17     Prescription Details   Frequency (times per week) 1   Duration Progress to 45 minutes of aerobic exercise without signs/symptoms of physical distress     Intensity   THRR 40-80% of Max Heartrate 61-122   Ratings of Perceived Exertion 11-13   Perceived Dyspnea 0-4     Progression   Progression Continue progressive overload as per policy without signs/symptoms or physical distress.     Resistance Training   Training Prescription Yes   Weight blue bands   Reps 10-15      Perform Capillary Blood Glucose checks as needed.  Exercise Prescription Changes:     Exercise Prescription Changes    Row Name 11/05/16 1600 11/10/16 1500 11/12/16 1500 11/17/16 1500 11/19/16 1500     Response to Exercise   Blood Pressure (Admit) 136/80 120/70 140/64 136/72 136/64   Blood Pressure (Exercise) 160/90 136/70 144/76 180/90 170/80   Blood Pressure (Exit) 122/80 124/70 126/74 112/80 118/62   Heart Rate (Admit) 98 bpm 82 bpm 86 bpm 73 bpm 80 bpm   Heart Rate (Exercise) 109 bpm 121 bpm 123 bpm 119 bpm 122 bpm   Heart Rate (Exit) 90 bpm 86 bpm 77 bpm 88 bpm 84 bpm   Oxygen Saturation (Admit) 95 % 97 % 96 % 97 % 97 %   Oxygen Saturation (Exercise) 93 % 88 % 92 % 89 % 90 %   Oxygen Saturation (Exit) 95 % 96 % 96 % 95 % 96 %   Rating of Perceived Exertion (Exercise) _0 Perceived Dyspnea (Exercise) _1 Duration Progress to 45 minutes of aerobic exercise without signs/symptoms of physical distress Progress to 45 minutes of aerobic exercise without signs/symptoms of physical distress Progress to 45 minutes of  aerobic exercise without signs/symptoms of physical distress Continue with 45 min of aerobic exercise without signs/symptoms of physical distress. Continue with 45 min of aerobic exercise without signs/symptoms of physical distress.   Intensity _2      Progression   Progression Continue to progress workloads to maintain intensity without signs/symptoms of physical distress. Continue to progress workloads to maintain intensity without signs/symptoms of physical distress. Continue to progress workloads to maintain intensity without signs/symptoms of physical distress. Continue to progress workloads to maintain intensity without signs/symptoms of physical distress. Continue to progress workloads to maintain intensity without signs/symptoms of physical distress.     Resistance Training   Training Prescription _3    Weight _4    Reps 10-15 10-15 10-15 10-15 10-15   Time 10 Minutes 10 Minutes 10 Minutes 10 Minutes 10 Minutes     Interval Training   Interval Training _5      NuStep   Level _6 Minutes _7 METs 2 2.6 1.7 2.4 2.7     Arm Ergometer   Level  - 6  -  -  -   Minutes  - 17  -  -  -     Rower   Level  -  -  - 4  -   Minutes  -  -  - 17  -     Track   Laps _8 Minutes _9 17  Home Exercise Plan   Plans to continue exercise at  -  - Home (comment)  -  -   Frequency  -  - Add 3 additional days to program exercise sessions.  -  -   Row Name 11/24/16 1500 11/26/16 1500 12/08/16 1500 12/10/16 1556 12/15/16 1500     Response to Exercise   Blood Pressure (Admit) 132/64 150/64 134/78 136/64 160/84   Blood Pressure (Exercise) 130/70 182/94 170/80 126/74 186/82   Blood Pressure (Exit) 126/68 124/72 122/60 120/66 128/72   Heart Rate (Admit) 76 bpm 81 bpm 80 bpm 79 bpm 80 bpm   Heart Rate  (Exercise) 109 bpm 106 bpm 118 bpm 109 bpm 122 bpm   Heart Rate (Exit) 94 bpm 96 bpm 97 bpm 82 bpm 95 bpm   Oxygen Saturation (Admit) 96 % 96 % 96 % 97 % 97 %   Oxygen Saturation (Exercise) 94 % 95 % 93 % 93 % 87 %   Oxygen Saturation (Exit) 95 % 96 % 96 % 95 % 96 %   Rating of Perceived Exertion (Exercise) _0 Perceived Dyspnea (Exercise) _1 Duration Continue with 30 min of aerobic exercise without signs/symptoms of physical distress. Continue with 45 min of aerobic exercise without signs/symptoms of physical distress. Continue with 45 min of aerobic exercise without signs/symptoms of physical distress. Continue with 45 min of aerobic exercise without signs/symptoms of physical distress. Continue with 45 min of aerobic exercise without signs/symptoms of physical distress.   Intensity _2      Progression   Progression Continue to progress workloads to maintain intensity without signs/symptoms of physical distress. Continue to progress workloads to maintain intensity without signs/symptoms of physical distress. Continue to progress workloads to maintain intensity without signs/symptoms of physical distress. Continue to progress workloads to maintain intensity without signs/symptoms of physical distress. Continue to progress workloads to maintain intensity without signs/symptoms of physical distress.     Resistance Training   Training Prescription _3    Weight _4    Reps 10-15 10-15 10-15 10-15 10-15   Time 10 Minutes 10 Minutes 10 Minutes 10 Minutes 10 Minutes     Interval Training   Interval Training _5      NuStep   Level 7  - _6 Minutes 17  - _7 METs 2.7  - 3.4 3.4 3.4     Arm/Foot Ergometer   Level  -  -  - 7  -   Minutes  -  -  - 17  -   METs  -  -  - 3.1  -     Rower   Level  - 4 4  - 4   Minutes   - 17 17  - 17     Track   Laps _8 Minutes _9 Row Name 12/17/16 1606 12/22/16 1500 12/24/16 1500 12/31/16 1500 01/05/17 1608     Response to Exercise   Blood Pressure (Admit) 142/70 140/76 146/70 138/66 126/60   Blood Pressure (Exercise) 140/70 154/80 144/70 174/82 162/82   Blood Pressure (Exit) 118/70 122/62 110/60 128/74 134/68   Heart Rate (Admit) 95 bpm 82 bpm 84 bpm 83 bpm 80 bpm  Heart Rate (Exercise) 101 bpm 121 bpm 132 bpm 115 bpm 125 bpm   Heart Rate (Exit) 89 bpm 88 bpm 99 bpm 83 bpm 96 bpm   Oxygen Saturation (Admit) 96 % 96 % 96 % 95 % 96 %   Oxygen Saturation (Exercise) 96 % 93 % 91 % 89 % 90 %   Oxygen Saturation (Exit) 95 % 95 % 94 % 94 % 95 %   Rating of Perceived Exertion (Exercise) _0 Perceived Dyspnea (Exercise) _1 Duration Continue with 45 min of aerobic exercise without signs/symptoms of physical distress. Continue with 45 min of aerobic exercise without signs/symptoms of physical distress. Continue with 45 min of aerobic exercise without signs/symptoms of physical distress. Continue with 45 min of aerobic exercise without signs/symptoms of physical distress. Continue with 45 min of aerobic exercise without signs/symptoms of physical distress.   Intensity _2      Progression   Progression Continue to progress workloads to maintain intensity without signs/symptoms of physical distress. Continue to progress workloads to maintain intensity without signs/symptoms of physical distress. Continue to progress workloads to maintain intensity without signs/symptoms of physical distress. Continue to progress workloads to maintain intensity without signs/symptoms of physical distress. Continue to progress workloads to maintain intensity without signs/symptoms of physical distress.     Resistance Training   Training Prescription _3    Weight blue  bands blue bands blue bands blue bands blue bands   Reps 10-15 10-15 10-15 10-15 10-15   Time 10 Minutes 10 Minutes 10 Minutes 10 Minutes 10 Minutes     Interval Training   Interval Training _4      NuStep   Level _5 Minutes _6 METs 4.4 5.1 3.3 3.7 3.8     Rower   Level 4 4  -  -  -   Minutes 17 17  -  -  -     Track   Laps  - 17 35 16 39   Minutes  - 17 34 17 34      Exercise Comments:     Exercise Comments    Row Name 11/12/16 1541           Exercise Comments Home exercise completed          Exercise Goals and Review:   Exercise Goals Re-Evaluation :     Exercise Goals Re-Evaluation    Row Name 11/09/16 1144 12/04/16 1341 01/05/17 0951         Exercise Goal Re-Evaluation   Exercise Goals Review Increase Physical Activity;Increase Strenth and Stamina Increase Strenth and Stamina;Increase Physical Activity Increase Strength and Stamina;Increase Physical Activity;Able to understand and use Dyspnea scale;Able to understand and use rate of perceived exertion (RPE) scale;Knowledge and understanding of Target Heart Rate Range (THRR);Understanding of Exercise Prescription     Comments Patient has only attended one exercise session. Will cont. to monitor and progress as able.  Patient is progressing well in program. Comes to each rehab session motivated to work hard. Struggles with shortness of breath. Home exercise completed. Will cont. to monitor and progress as able.  Patient is progressing well in program. Comes to each rehab session motivated to work hard. Struggles with shortness of breath. Walks up to 16 laps (200 feet each) in 15 minutes.  Home exercise completed. Will cont. to monitor and progress as able.      Expected Outcomes Through exercising at rehab and at home, patient will increase physical strength and stamina and find ADL's easier to perform.  Through exercising at rehab and at home, patient will increase physical  strength and stamina and find ADL's easier to perform.  Through exercising at rehab and at home, patient will increase physical strength and stamina and find ADL's easier to perform.         Discharge Exercise Prescription (Final Exercise Prescription Changes):     Exercise Prescription Changes - 01/05/17 1608      Response to Exercise   Blood Pressure (Admit) 126/60   Blood Pressure (Exercise) 162/82   Blood Pressure (Exit) 134/68   Heart Rate (Admit) 80 bpm   Heart Rate (Exercise) 125 bpm   Heart Rate (Exit) 96 bpm   Oxygen Saturation (Admit) 96 %   Oxygen Saturation (Exercise) 90 %   Oxygen Saturation (Exit) 95 %   Rating of Perceived Exertion (Exercise) 13   Perceived Dyspnea (Exercise) 3   Duration Continue with 45 min of aerobic exercise without signs/symptoms of physical distress.   Intensity THRR unchanged     Progression   Progression Continue to progress workloads to maintain intensity without signs/symptoms of physical distress.     Resistance Training   Training Prescription Yes   Weight blue bands   Reps 10-15   Time 10 Minutes     Interval Training   Interval Training No     NuStep   Level 7   Minutes 17   METs 3.8     Track   Laps 39   Minutes 34      Nutrition:  Target Goals: Understanding of nutrition guidelines, daily intake of sodium <1570m, cholesterol <2070m calories 30% from fat and 7% or less from saturated fats, daily to have 5 or more servings of fruits and vegetables.  Biometrics:    Nutrition Therapy Plan and Nutrition Goals:     Nutrition Therapy & Goals - 11/24/16 1505      Nutrition Therapy   Diet Carb Modified, Therapeutic Lifestyle Changes     Personal Nutrition Goals   Nutrition Goal Pt to be able to name foods that affect blood sugar.    Personal Goal #2 Pt to maintain his wt around 220 lb until pt is ready to focus on wt loss.    Personal Goal #3 Describe the benefit of including fruits, vegetables, whole grains,  and low-fat dairy products in a healthy meal plan.   Personal Goal #4 Pt to increase frequency of food consumed to at least a small snack (e.g. 1/4 cup nuts, Greek yogurt) every 5 hours.     Intervention Plan   Intervention Prescribe, educate and counsel regarding individualized specific dietary modifications aiming towards targeted core components such as weight, hypertension, lipid management, diabetes, heart failure and other comorbidities.   Expected Outcomes Short Term Goal: Understand basic principles of dietary content, such as calories, fat, sodium, cholesterol and nutrients.;Long Term Goal: Adherence to prescribed nutrition plan.      Nutrition Discharge: Rate Your Plate Scores:     Nutrition Assessments - 11/24/16 1322      Rate Your Plate Scores   Pre Score 51      Nutrition Goals Re-Evaluation:   Nutrition Goals Discharge (Final Nutrition Goals Re-Evaluation):   Psychosocial: Target Goals: Acknowledge presence or absence of significant depression and/or stress, maximize coping  skills, provide positive support system. Participant is able to verbalize types and ability to use techniques and skills needed for reducing stress and depression.  Initial Review & Psychosocial Screening:     Initial Psych Review & Screening - 12/08/16 1621      Initial Review   Current issues with --      Quality of Life Scores:   PHQ-9: Recent Review Flowsheet Data    Depression screen [facility] 2/9 10/19/2016 07/29/2016 07/27/2016 07/28/2015 07/24/2015   Decreased Interest 1 0 0 0 0   Down, Depressed, Hopeless 3 0 0 0 0   PHQ - 2 Score 4 0 0 0 0   Altered sleeping 3 - - - -   Tired, decreased energy 3 - - - -   Change in appetite 3 - - - -   Feeling bad or failure about yourself  0 - - - -   Trouble concentrating 0 - - - -   Moving slowly or fidgety/restless 0 - - - -   Suicidal thoughts 0 - - - -   PHQ-9 Score 13 - - - -   Difficult doing work/chores Not difficult at all - - - -      Interpretation of Total Score  Total Score Depression Severity:  1-4 = Minimal depression, 5-9 = Mild depression, 10-14 = Moderate depression, 15-19 = Moderately severe depression, 20-27 = Severe depression   Psychosocial Evaluation and Intervention:     Psychosocial Evaluation - 11/10/16 0907      Psychosocial Evaluation & Interventions   Interventions Encouraged to exercise with the program and follow exercise prescription      Psychosocial Re-Evaluation:     Psychosocial Re-Evaluation    Row Name 11/10/16 0908 12/08/16 1624 12/08/16 1625 01/06/17 2130       Psychosocial Re-Evaluation   Current issues with None Identified  - Current Stress Concerns Current Stress Concerns    Comments  - patient is discouraged with his continue shortness of breath but is beginning to accept that he may need valve surgery inorder for it to improv  - patient has accepted the need for valve surgery. has appointment with cardiologist september 5 to discuss options    Interventions  -  - Encouraged to attend Pulmonary Rehabilitation for the exercise  encouraged patient to re-evaluate need for valve surgery with cardiothoracic surgeon Encouraged to attend Pulmonary Rehabilitation for the exercise    Continue Psychosocial Services  No Follow up required  - Follow up required by staff Follow up required by staff    Comments  -  - if his shortness of breath is related to his valve then pulmonary rehab may not improve his shortness of breath as he anticipated. if his shortness of breath is related to his valve then pulmonary rehab may not improve his shortness of breath as he anticipated.      Initial Review   Source of Stress Concerns  -  - Unable to participate in former interests or hobbies;Unable to perform yard/household activities Unable to participate in former interests or hobbies;Unable to perform yard/household activities       Psychosocial Discharge (Final Psychosocial Re-Evaluation):      Psychosocial Re-Evaluation - 01/06/17 2130      Psychosocial Re-Evaluation   Current issues with Current Stress Concerns   Comments patient has accepted the need for valve surgery. has appointment with cardiologist september 5 to discuss options   Interventions Encouraged to attend Pulmonary Rehabilitation for the exercise  Continue Psychosocial Services  Follow up required by staff   Comments if his shortness of breath is related to his valve then pulmonary rehab may not improve his shortness of breath as he anticipated.     Initial Review   Source of Stress Concerns Unable to participate in former interests or hobbies;Unable to perform yard/household activities      Education: Education Goals: Education classes will be provided on a weekly basis, covering required topics. Participant will state understanding/return demonstration of topics presented.  Learning Barriers/Preferences:     Learning Barriers/Preferences - 10/19/16 1049      Learning Barriers/Preferences   Learning Barriers None   Learning Preferences Audio;Computer/Internet;Pictoral;Skilled Demonstration;Verbal Instruction;Video;Written Material;Individual Instruction;Group Instruction      Education Topics: Risk Factor Reduction:  -Group instruction that is supported by a PowerPoint presentation. Instructor discusses the definition of a risk factor, different risk factors for pulmonary disease, and how the heart and lungs work together.     Nutrition for Pulmonary Patient:  -Group instruction provided by PowerPoint slides, verbal discussion, and written materials to support subject matter. The instructor gives an explanation and review of healthy diet recommendations, which includes a discussion on weight management, recommendations for fruit and vegetable consumption, as well as protein, fluid, caffeine, fiber, sodium, sugar, and alcohol. Tips for eating when patients are short of breath are discussed.   PULMONARY  REHAB OTHER RESPIRATORY from 12/31/2016 in [place]  Date  12/31/16  Educator  RD  Instruction Review Code  2- meets goals/outcomes      Pursed Lip Breathing:  -Group instruction that is supported by demonstration and informational handouts. Instructor discusses the benefits of pursed lip and diaphragmatic breathing and detailed demonstration on how to preform both.     Oxygen Safety:  -Group instruction provided by PowerPoint, verbal discussion, and written material to support subject matter. There is an overview of "What is Oxygen" and "Why do we need it".  Instructor also reviews how to create a safe environment for oxygen use, the importance of using oxygen as prescribed, and the risks of noncompliance. There is a brief discussion on traveling with oxygen and resources the patient may utilize.   PULMONARY REHAB OTHER RESPIRATORY from 12/31/2016 in [place]  Date  12/17/16  Educator  Truddie Crumble  Instruction Review Code  2- meets goals/outcomes      Oxygen Equipment:  -Group instruction provided by [employer] Staff utilizing handouts, written materials, and equipment demonstrations.   Signs and Symptoms:  -Group instruction provided by written material and verbal discussion to support subject matter. Warning signs and symptoms of infection, stroke, and heart attack are reviewed and when to call the physician/911 reinforced. Tips for preventing the spread of infection discussed.   PULMONARY REHAB OTHER RESPIRATORY from 12/31/2016 in [place]  Date  11/19/16  Educator  rn  Instruction Review Code  2- meets goals/outcomes      Advanced Directives:  -Group instruction provided by verbal instruction and written material to support subject matter. Instructor reviews Advanced Directive laws and proper instruction for filling out document.   Pulmonary Video:  -Group video education that  reviews the importance of medication and oxygen compliance, exercise, good nutrition, pulmonary hygiene, and pursed lip and diaphragmatic breathing for the pulmonary patient.   Exercise for the Pulmonary Patient:  -Group instruction that is supported by a PowerPoint presentation. Instructor discusses benefits of exercise, core components of exercise, frequency,  duration, and intensity of an exercise routine, importance of utilizing pulse oximetry during exercise, safety while exercising, and options of places to exercise outside of rehab.     PULMONARY REHAB OTHER RESPIRATORY from 12/31/2016 in [place]  Date  11/05/16  Educator  md  Instruction Review Code  2- meets goals/outcomes      Pulmonary Medications:  -Verbally interactive group education provided by instructor with focus on inhaled medications and proper administration.   Anatomy and Physiology of the Respiratory System and Intimacy:  -Group instruction provided by PowerPoint, verbal discussion, and written material to support subject matter. Instructor reviews respiratory cycle and anatomical components of the respiratory system and their functions. Instructor also reviews differences in obstructive and restrictive respiratory diseases with examples of each. Intimacy, Sex, and Sexuality differences are reviewed with a discussion on how relationships can change when diagnosed with pulmonary disease. Common sexual concerns are reviewed.   PULMONARY REHAB OTHER RESPIRATORY from 12/31/2016 in [place]  Date  11/26/16  Educator  rn  Instruction Review Code  2- meets goals/outcomes      MD DAY -A group question and answer session with a medical doctor that allows participants to ask questions that relate to their pulmonary disease state.   PULMONARY REHAB OTHER RESPIRATORY from 12/31/2016 in [place]  Date  11/12/16  Educator  yacoub   Instruction Review Code  2- meets goals/outcomes      OTHER EDUCATION -Group or individual verbal, written, or video instructions that support the educational goals of the pulmonary rehab program.   Knowledge Questionnaire Score:     Knowledge Questionnaire Score - 11/07/16 1506      Knowledge Questionnaire Score   Pre Score 12/13      Core Components/Risk Factors/Patient Goals at Admission:     Personal Goals and Risk Factors at Admission - 10/19/16 1056      Core Components/Risk Factors/Patient Goals on Admission   Improve shortness of breath with ADL's Yes   Intervention Provide education, individualized exercise plan and daily activity instruction to help decrease symptoms of SOB with activities of daily living.   Expected Outcomes Short Term: Achieves a reduction of symptoms when performing activities of daily living.   Develop more efficient breathing techniques such as purse lipped breathing and diaphragmatic breathing; and practicing self-pacing with activity Yes   Increase knowledge of respiratory medications and ability to use respiratory devices properly  Yes   Intervention Provide education and demonstration as needed of appropriate use of medications, inhalers, and oxygen therapy.   Expected Outcomes Short Term: Achieves understanding of medications use. Understands that oxygen is a medication prescribed by physician. Demonstrates appropriate use of inhaler and oxygen therapy.      Core Components/Risk Factors/Patient Goals Review:      Goals and Risk Factor Review    Row Name 11/10/16 3151 12/08/16 1617 01/06/17 2129         Core Components/Risk Factors/Patient Goals Review   Personal Goals Review Improve shortness of breath with ADL's;Increase knowledge of respiratory medications and ability to use respiratory devices properly.;Develop more efficient breathing techniques such as purse lipped breathing and diaphragmatic breathing and practicing self-pacing  with activity. Weight Management/Obesity;Improve shortness of breath with ADL's;Increase knowledge of respiratory medications and ability to use respiratory devices properly.;Develop more efficient breathing techniques such as purse lipped breathing and diaphragmatic breathing and practicing self-pacing with activity. Weight Management/Obesity;Improve shortness of breath with  ADL's;Increase knowledge of respiratory medications and ability to use respiratory devices properly.;Develop more efficient breathing techniques such as purse lipped breathing and diaphragmatic breathing and practicing self-pacing with activity.     Review Has only attended 1 exercise session, too early to have met any goals patient has progressed well over the past 30 days in pulmonary rehab. he has tolerated workload increases although is limited by his shortness of breath. This may not be related to any pulmonary illness but to his need for a valve replacement. he is utilizing PLB when appropriate. He is beginning to ask question regarding the effects of a round abdomen on breathing and has realized that if he can loose some weight his breathing may improve somewhat.  patient has progressed well over the past 30 days in pulmonary rehab. he has tolerated workload increases although is limited by his shortness of breath. This may not be related to any pulmonary illness but to his need for a valve replacement. he is utilizing PLB when appropriate. He is beginning to ask question regarding the effects of a round abdomen on breathing and has realized that if he can loose some weight his breathing may improve somewhat.      Expected Outcomes expect to see more progression of goals in the next 30 days expect to see more progression of goals in the next 30 days expect to see more progression of goals in the next 30 days        Core Components/Risk Factors/Patient Goals at Discharge (Final Review):      Goals and Risk Factor Review -  01/06/17 2129      Core Components/Risk Factors/Patient Goals Review   Personal Goals Review Weight Management/Obesity;Improve shortness of breath with ADL's;Increase knowledge of respiratory medications and ability to use respiratory devices properly.;Develop more efficient breathing techniques such as purse lipped breathing and diaphragmatic breathing and practicing self-pacing with activity.   Review patient has progressed well over the past 30 days in pulmonary rehab. he has tolerated workload increases although is limited by his shortness of breath. This may not be related to any pulmonary illness but to his need for a valve replacement. he is utilizing PLB when appropriate. He is beginning to ask question regarding the effects of a round abdomen on breathing and has realized that if he can loose some weight his breathing may improve somewhat.    Expected Outcomes expect to see more progression of goals in the next 30 days      ITP Comments:   Comments:ITP REVIEW Pt is making expected slow progress toward pulmonary rehab goals after completing 16 sessions. Recommend continued exercise, life style modification, education, and utilization of breathing techniques to increase stamina and strength and decrease shortness of breath with exertion.

## 2017-01-07 ENCOUNTER — Encounter (HOSPITAL_COMMUNITY)
Admission: RE | Admit: 2017-01-07 | Discharge: 2017-01-07 | Disposition: A | Discharge status: Home / Self Care | Payer: Medicare Other | Source: Ambulatory Visit | Attending: Thoracic Surgery (Cardiothoracic Vascular Surgery) | Admitting: Thoracic Surgery (Cardiothoracic Vascular Surgery)

## 2017-01-07 VITALS — Wt 225.5 lb

## 2017-01-07 DIAGNOSIS — C3491 Malignant neoplasm of unspecified part of right bronchus or lung: Secondary | ICD-10-CM

## 2017-01-07 NOTE — Progress Notes (Signed)
Daily Session Note  Patient Details  Name: James [name] MRN: [id number] Date of Birth: [birth year] Referring Provider:     Pulmonary Rehab Walk Test from 10/29/2016 in [place]  Referring Provider  Dr. [name]      Encounter Date: 01/07/2017  Check In:     Session Check In - 01/07/17 1330      Check-In   Location [facility]   Staff Present Rodney Langton, Felipe Drone, RN, MHA;Montavious Wierzba Rollene Rotunda, RN, BSN   Supervising physician immediately available to respond to emergencies [facility] immediately available   Physician(s) Dr. Wendee Beavers   Medication changes reported     No   Fall or balance concerns reported    No   Tobacco Cessation No Change   Warm-up and Cool-down Performed as group-led instruction   Resistance Training Performed Yes   VAD Patient? No     Pain Assessment   Currently in Pain? No/denies   Multiple Pain Sites No      Capillary Blood Glucose: No results found for this or any previous visit (from the past 24 hour(s)).      Exercise Prescription Changes - 01/07/17 1603      Response to Exercise   Blood Pressure (Admit) 120/66   Blood Pressure (Exercise) 128/58   Blood Pressure (Exit) 118/68   Heart Rate (Admit) 83 bpm   Heart Rate (Exercise) 103 bpm   Heart Rate (Exit) 84 bpm   Oxygen Saturation (Admit) 96 %   Oxygen Saturation (Exercise) 94 %   Oxygen Saturation (Exit) 95 %   Rating of Perceived Exertion (Exercise) 12   Perceived Dyspnea (Exercise) 2   Duration Continue with 45 min of aerobic exercise without signs/symptoms of physical distress.   Intensity THRR unchanged     Progression   Progression Continue to progress workloads to maintain intensity without signs/symptoms of physical distress.     Resistance Training   Training Prescription Yes   Weight blue bands   Reps 10-15   Time 10 Minutes     Interval Training   Interval Training No     NuStep   Level 7   Minutes 17    METs 3.5     Track   Laps 22   Minutes 17      History  Smoking Status  . Never Smoker  Smokeless Tobacco  . Never Used    Goals Met:  Independence with exercise equipment Using PLB without cueing & demonstrates good technique Exercise tolerated well No report of cardiac concerns or symptoms Strength training completed today  Goals Unmet:  PD  Comments: Service time is from 1330 to 1520   Dr. Rush Farmer is Medical Director for Pulmonary Rehab at [facility].

## 2017-01-10 NOTE — Progress Notes (Signed)
Cardiology Office Note    Date:  01/13/2017   ID:  James [name], DOB [birth year], MRN [id number]  PCP:  [name], MD  Cardiologist:  Ab Leaming Martinique, MD    History of Present Illness:  James [name] is a 66 y.o. male is seen for follow up  MR. History of DM type 2 on oral therapy and HLD. He states he was in good health until he had a flu like illness earlier this year. He was seen by his physician and a heart murmur was noted. He states he has never had a heart murmur before. He was noted to have a loud murmur of MR. Careful review of his Echo suggested a partially flail leaflet. MR was graded as mild but this was felt to be an underestimate. A TEE was performed with a flail P3 segment/ruptured chord and severe MR. Cardiac catheterization performed on 08/11/2016 shows 65% mid to distal LAD lesion, FFR borderline 0.81, EF 65%, normal RV and LV filling pressure, normal cardiac output.He was also noted to have a right upper lobe nodule seen on the previous CT, he has been seen by [name] was pulmonology service, PET study obtained on 08/17/2016 showed hypermetabolic solid 2.6 cm anterior right upper lobe pulmonary nodule compatible with prior bronchogenic, no hypermetabolic thoracic lymphadenopathy or distal metastatic disease. There were nonspecific and mild asymmetric hypermetabolism in the left palatine tonsil/left tongue base region. After consultation with Dr. Roxy Manns and Dr. [name] it was recommended he undergo lobectomy for his lung mass with possible CABG with LIMA to LAD and MV repair at a later date.   He underwent RUL lobectomy in April. Pathology positive for adenoCA stage IA. Seen by oncology with plans for follow up with serial CT. No adjuvant chemotherapy planned. He reports that initially he lost his voice and was very SOB. Seen by Dr. [name] on May 10 and given lasix and antibiotics. 5 days later CXR done and lasix stopped. When seen in June noted worsening SOB. CT angio done  and was positive for PE. He was started on Xarelto. He has since been participating in pulmonary Rehab for the past 3 months.  On follow up today he is still frustrated by his SOB. Some days are worse than others. No cough. Still hoarse. If he bends over he really loses his breath. His edema has mostly resolved with lasix. No orthopnea or PND. Notes some left parasternal chest pain intermittently. Scheduled for repeat CT chest in November with oncology.   Past Medical History:  Diagnosis Date  . Adenocarcinoma of right lung, stage 1 ([place]) 09/06/2016  . Anxiety   . Arthritis    "knees, hips, back" (10/19/2012)  . Chronic lower back pain   . Colon polyps    10/27/2002, repeat letter 09/17/2007  . Coronary artery disease   . Depressive disorder, not elsewhere classified   . Diabetes mellitus without complication (HCC)    diet controlled  . Fasting hyperglycemia   . GERD (gastroesophageal reflux disease)   . Heart murmur   . Hemoptysis    abnormal CT Chest 01/29/10 - ? new GG changes RUL > not viz on plain cxr 02/26/2010  . Mitral regurgitation    severe MR 08/2016  . MPN (myeloproliferative neoplasm) ([place])    1st detected 06/04/1998  . Obesity   . OSA on CPAP    last sleep study 10 years ago  . Other and unspecified hyperlipidemia   . Positive PPD 1965   "  non reactive in 2012" (10/19/2012)  . Routine general medical examination at a health care facility   . Special screening for malignant neoplasm of prostate   . Spinal stenosis, unspecified region other than cervical   . Wrist pain, left     Past Surgical History:  Procedure Laterality Date  . ANTERIOR CRUCIATE LIGAMENT REPAIR Left 1967  . CARDIAC CATHETERIZATION  2000  . CHEST TUBE INSERTION Right 10/19/2012   post bronch  . COLONOSCOPY W/ POLYPECTOMY    . FLEXIBLE BRONCHOSCOPY  10/19/2012   Flexible video fiberoptic bronchoscopy with electromagnetic navigation and biopsies.  . INTRAVASCULAR PRESSURE WIRE/FFR STUDY N/A 08/11/2016    Procedure: Intravascular Pressure Wire/FFR Study;  Surgeon: Shelbey Spindler M Martinique, MD;  Location: [facility];  Service: Cardiovascular;  Laterality: N/A;  . LOBECTOMY Right 09/02/2016   Procedure: RIGHT UPPER LOBECTOMY;  Surgeon: Melrose Nakayama, MD;  Location: [facility];  Service: Thoracic;  Laterality: Right;  . LYMPH NODE DISSECTION Right 09/02/2016   Procedure: LYMPH NODE DISSECTION, RIGHT LUNG;  Surgeon: Melrose Nakayama, MD;  Location: [facility];  Service: Thoracic;  Laterality: Right;  . RIGHT/LEFT HEART CATH AND CORONARY ANGIOGRAPHY N/A 08/11/2016   Procedure: Right/Left Heart Cath and Coronary Angiography;  Surgeon: Terrance Usery M Martinique, MD;  Location: [facility];  Service: Cardiovascular;  Laterality: N/A;  . TEE WITHOUT CARDIOVERSION N/A 07/17/2016   Procedure: TRANSESOPHAGEAL ECHOCARDIOGRAM (TEE);  Surgeon: [name], MD;  Location: [facility];  Service: Cardiovascular;  Laterality: N/A;  . TONSILLECTOMY  1950's  . TOTAL HIP ARTHROPLASTY Left 10/05/2014   dr [name]  . TOTAL HIP ARTHROPLASTY Left 10/05/2014   Procedure: LEFT TOTAL HIP ARTHROPLASTY ANTERIOR APPROACH;  Surgeon: [name], MD;  Location: [facility];  Service: Orthopedics;  Laterality: Left;  Marland Kitchen VIDEO ASSISTED THORACOSCOPY (VATS)/WEDGE RESECTION Right 09/02/2016   Procedure: VIDEO ASSISTED THORACOSCOPY (VATS)/RIGHT UPPER LOBE WEDGE RESECTION;  Surgeon: Melrose Nakayama, MD;  Location: [facility];  Service: Thoracic;  Laterality: Right;  Marland Kitchen VIDEO BRONCHOSCOPY WITH ENDOBRONCHIAL NAVIGATION N/A 10/19/2012   Procedure: VIDEO BRONCHOSCOPY WITH ENDOBRONCHIAL NAVIGATION;  Surgeon: [name], MD;  Location: [place];  Service: Thoracic;  Laterality: N/A;  . WRIST RECONSTRUCTION  12/2009   'proximal row carpectomy" [name]    Current Medications: Outpatient Medications Prior to Visit  Medication Sig Dispense Refill  . acetaminophen (TYLENOL) 325 MG tablet Take 650 mg by mouth every 6 (six) hours as needed for moderate pain or  headache.    Marland Kitchen aspirin EC 81 MG tablet Take 1 tablet (81 mg total) by mouth daily. 90 tablet 3  . atorvastatin (LIPITOR) 20 MG tablet Take 1 tablet (20 mg total) by mouth daily. 90 tablet 3  . FLAXSEED, LINSEED, PO Take 20 mLs by mouth daily. Sprinkled in drink daily    . furosemide (LASIX) 40 MG tablet TAKE 1 TABLET BY MOUTH EVERY DAY 90 tablet 3  . Multiple Vitamin (MULTIVITAMIN) tablet Take 1 tablet by mouth daily.    . Omega-3 Fatty Acids (FISH OIL) 500 MG CAPS Take 500 mg by mouth daily.     . potassium chloride SA (K-DUR,KLOR-CON) 20 MEQ tablet Take 1 tablet (20 mEq total) by mouth daily. 30 tablet 1  . psyllium (METAMUCIL) 58.6 % packet Take 1 packet by mouth daily.    . rivaroxaban (XARELTO) 20 MG TABS tablet Take 1 tablet (20 mg total) by mouth daily with supper. 30 tablet 1  . Rivaroxaban 15 & 20 MG TBPK  Take as directed on package: Start with one 15mg  tablet by mouth twice a day with food. On Day 22, switch to one 20mg  tablet once a day with food. 51 each 0  . sodium chloride (OCEAN) 0.65 % SOLN nasal spray Place 2 sprays into both nostrils at bedtime.    Bethann Humble Sulfate (ALLERGY RELIEF EYE DROPS OP) Apply 2 drops to eye daily as needed (allergy).    . traMADol (ULTRAM) 50 MG tablet Take 50 mg by mouth every 4-6 hours PRN severe pain. (Patient taking differently: Take 50 mg by mouth every 4 (four) hours as needed for moderate pain. ) 30 tablet 0  . Undecylenic Ac-Zn Undecylenate (FUNGI-NAIL TOE & FOOT EX) Apply 1 application topically daily.     No facility-administered medications prior to visit.      Allergies:   Amoxicillin   Social History   Social History  . Marital status: Married    Spouse name: N/A  . Number of children: 3  . Years of education: N/A   Occupational History  . Retired, Financial risk analyst   . Retired, Real estate     Slow   Social History Main Topics  . Smoking status: Never Smoker  . Smokeless tobacco: Never Used  . Alcohol use 0.6  oz/week    1 Cans of beer per week  . Drug use: No  . Sexual activity: No   Other Topics Concern  . None   Social History Narrative   HSG, [employer]. Married '73.  2 sons - '74,   '76;  1 daughter -  '77  6 grandchildren.   Work - IT consultant now retired. ACP - not fully discussed.                     Family History:  The patient's family history includes Heart attack in his father; Heart disease in his father and paternal uncle; Heart failure in his mother.   ROS:   Please see the history of present illness.    ROS All other systems reviewed and are negative.   PHYSICAL EXAM:   VS:  BP 118/82   Pulse 72   Ht 5\' 9"  (1.753 m)   Wt 224 lb (101.6 kg)   BMI 33.08 kg/m    GEN: Well nourished, well developed, in no acute distress  HEENT: normal  Neck: no JVD, carotid bruits, or masses Cardiac: RRR; there is a loud gr 3/6 harsh systolic murmur heard best at the LSB and apex radiating into the left axilla. No diastolic murmur or gallop. trace edema.  Respiratory:  clear to auscultation bilaterally with some diminished BS on right. GI: soft, nontender, nondistended, + BS MS: no deformity or atrophy  Skin: warm and dry, no rash Neuro:  Alert and Oriented x 3, Strength and sensation are intact Psych: euthymic mood, full affect  Wt Readings from Last 3 Encounters:  01/13/17 224 lb (101.6 kg)  01/12/17 225 lb 1.4 oz (102.1 kg)  01/07/17 225 lb 8.5 oz (102.3 kg)      Studies/Labs Reviewed:   EKG:  EKG is not ordered today.    Recent Labs: 07/27/2016: TSH 2.14 10/01/2016: ALT 24 10/21/2016: B Natriuretic Peptide 7.8; BUN 15; Creatinine, Ser 1.01; Potassium 3.9; Sodium 138 10/22/2016: Hemoglobin 14.6; Platelets 204   Lipid Panel    Component Value Date/Time   CHOL 245 (H) 06/29/2016 1153   TRIG 198.0 (H) 06/29/2016 1153   TRIG 108  05/20/2006 0825   HDL 35.90 (L) 06/29/2016 1153   CHOLHDL 7 06/29/2016 1153   VLDL 39.6 06/29/2016 1153    LDLCALC 170 (H) 06/29/2016 1153   LDLDIRECT 177.0 07/24/2015 1052    Additional studies/ records that were reviewed today include:   Event monitor 08/14/15: Study Highlights   NSR  Average HR 64 bpm No significant arrhythmias No AV block No pauses      Echo: 07/01/16: Study Conclusions  - Left ventricle: The cavity size was mildly dilated. There was   mild concentric hypertrophy. Systolic function was normal. The   estimated ejection fraction was in the range of 60% to 65%. Wall   motion was normal; there were no regional wall motion   abnormalities. Left ventricular diastolic function parameters   were normal. - Aortic valve: Transvalvular velocity was within the normal range.   There was no stenosis. There was no regurgitation. - Mitral valve: Transvalvular velocity was within the normal range.   There was no evidence for stenosis. There was mild regurgitation. - Left atrium: The atrium was moderately dilated. - Right ventricle: The cavity size was normal. Wall thickness was   normal. Systolic function was normal. - Atrial septum: No defect or patent foramen ovale was identified   by color flow Doppler. - Tricuspid valve: There was trivial regurgitation. - Pulmonary arteries: Systolic pressure was within the normal   range. PA peak pressure: 24 mm Hg (S).  TEE 07/17/16: Study Conclusions  - Left ventricle: The cavity size was normal. There was mild   concentric hypertrophy. Systolic function was normal. The   estimated ejection fraction was in the range of 60% to 65%. Wall   motion was normal; there were no regional wall motion   abnormalities. - Aortic valve: No evidence of vegetation. - Mitral valve: There is a partially flail P3 segment due to   ruptured cord, seen prolapsing in both 2D and 3D images. There is   severe, posterolaterally directed mitral regurgitation which   swirls into the left atrial appendage. - Left atrium: The atrium was dilated. - Right  atrium: No evidence of thrombus in the atrial cavity or   appendage. - Atrial septum: No defect or patent foramen ovale was identified.  Impressions:  - Flail P3 segment of the mitral leaflet with a rupture cord and   severe, posterolaterally directed MR which swirls in the LAA.   LVEF 60-65%.  Procedures   Intravascular Pressure Wire/FFR Study  Right/Left Heart Cath and Coronary Angiography  Conclusion     Prox LAD to Mid LAD lesion, 30 %stenosed.  Mid LAD to Dist LAD lesion, 65 %stenosed.  Prox RCA to Mid RCA lesion, 15 %stenosed.  The left ventricular systolic function is normal.  LV end diastolic pressure is normal.  The left ventricular ejection fraction is 55-65% by visual estimate.  LV end diastolic pressure is normal.   1. Borderline single vessel obstructive CAD involving the mid LAD. FFR 0.81 2. Normal LV function EF 65% 3. Normal right heart and LV filling pressures 4. Normal Cardiac output      ASSESSMENT:    1. Severe mitral regurgitation   2. Coronary artery disease involving native coronary artery of native heart without angina pectoris   3. Hyperlipidemia, unspecified hyperlipidemia type   4. Malignant neoplasm of upper lobe of right lung (HCC)      PLAN:  In order of problems listed above:  1. Severe MR. TEE confirmed a ruptured chord involving the  P3 leaflet with severe MR.  He does have persistent dyspnea. It is difficult to tell how much of this is related to his cardiac condition versus residual from lobectomy and PE. Spirometry in April prior to lobectomy looked OK. He is now 4.5 months from his surgery.   Recommend MV repair  to reduce risk of long term sequela such as pulmonary HTN, CHF, Afib. Will schedule follow up with Dr. Roxy Manns to discuss timing of surgery for his MV with LIMA to LAD. 2. AdenoCA lung s/p lobectomy. Stage 1A. No adjuvant chemo planned. Repeat CT every 6 months. 3. CAD with borderline LAD stenosis FFR at cut off. Will  plan LIMA when MV repair done. 4. Mixed hyperlipidemia. Now on high dose statin therapy. 5.   Pulmonary embolus June 2018.  On Xarelto.   Medication Adjustments/Labs and Tests Ordered: Current medicines are reviewed at length with the patient today.  Concerns regarding medicines are outlined above.  Medication changes, Labs and Tests ordered today are listed in the Patient Instructions below. Patient Instructions  Go ahead and schedule follow up with Dr. Roxy Manns  I will see you in 3 months      Signed, Demarie Uhlig Martinique, MD  01/13/2017 11:10 AM    [facility] [address] [place], [place], [address] [phone]

## 2017-01-12 ENCOUNTER — Encounter (HOSPITAL_COMMUNITY)
Admission: RE | Admit: 2017-01-12 | Discharge: 2017-01-12 | Disposition: A | Discharge status: Home / Self Care | Payer: Medicare Other | Source: Ambulatory Visit | Attending: Thoracic Surgery (Cardiothoracic Vascular Surgery) | Admitting: Thoracic Surgery (Cardiothoracic Vascular Surgery)

## 2017-01-12 VITALS — Wt 225.1 lb

## 2017-01-12 DIAGNOSIS — C3491 Malignant neoplasm of unspecified part of right bronchus or lung: Secondary | ICD-10-CM | POA: Insufficient documentation

## 2017-01-12 DIAGNOSIS — Z7982 Long term (current) use of aspirin: Secondary | ICD-10-CM | POA: Diagnosis not present

## 2017-01-12 DIAGNOSIS — F329 Major depressive disorder, single episode, unspecified: Secondary | ICD-10-CM | POA: Diagnosis not present

## 2017-01-12 DIAGNOSIS — E119 Type 2 diabetes mellitus without complications: Secondary | ICD-10-CM | POA: Insufficient documentation

## 2017-01-12 DIAGNOSIS — F419 Anxiety disorder, unspecified: Secondary | ICD-10-CM | POA: Diagnosis not present

## 2017-01-12 DIAGNOSIS — K219 Gastro-esophageal reflux disease without esophagitis: Secondary | ICD-10-CM | POA: Diagnosis not present

## 2017-01-12 DIAGNOSIS — Z7901 Long term (current) use of anticoagulants: Secondary | ICD-10-CM | POA: Diagnosis not present

## 2017-01-12 DIAGNOSIS — I2510 Atherosclerotic heart disease of native coronary artery without angina pectoris: Secondary | ICD-10-CM | POA: Insufficient documentation

## 2017-01-12 DIAGNOSIS — G4733 Obstructive sleep apnea (adult) (pediatric): Secondary | ICD-10-CM | POA: Diagnosis not present

## 2017-01-12 DIAGNOSIS — Z79899 Other long term (current) drug therapy: Secondary | ICD-10-CM | POA: Diagnosis not present

## 2017-01-12 DIAGNOSIS — E784 Other hyperlipidemia: Secondary | ICD-10-CM | POA: Diagnosis not present

## 2017-01-12 NOTE — Progress Notes (Signed)
Daily Session Note  Patient Details  Name: James [name] MRN: [id number] Date of Birth: 10-09-1950 Referring Provider:     Pulmonary Rehab Walk Test from 10/29/2016 in [place]  Referring Provider  Dr. [name]      Encounter Date: 01/12/2017  Check In:     Session Check In - 01/12/17 1330      Check-In   Location [facility]   Staff Present Carney Living, RN;Loic Hobin Rollene Rotunda, RN, BSN;Molly diVincenzo, MS, ACSM RCEP, Exercise Physiologist   Supervising physician immediately available to respond to emergencies [facility] immediately available   Physician(s) Dr. Bonner Puna   Medication changes reported     No   Fall or balance concerns reported    No   Tobacco Cessation No Change   Warm-up and Cool-down Performed as group-led instruction   Resistance Training Performed Yes   VAD Patient? No     Pain Assessment   Currently in Pain? No/denies   Multiple Pain Sites No      Capillary Blood Glucose: No results found for this or any previous visit (from the past 24 hour(s)).      Exercise Prescription Changes - 01/12/17 1533      Response to Exercise   Blood Pressure (Admit) 130/60   Blood Pressure (Exercise) 128/58   Blood Pressure (Exit) 118/68   Heart Rate (Admit) 83 bpm   Heart Rate (Exercise) 103 bpm   Heart Rate (Exit) 84 bpm   Oxygen Saturation (Admit) 96 %   Oxygen Saturation (Exercise) 91 %   Oxygen Saturation (Exit) 96 %   Rating of Perceived Exertion (Exercise) 12   Perceived Dyspnea (Exercise) 1   Duration Continue with 45 min of aerobic exercise without signs/symptoms of physical distress.   Intensity THRR unchanged     Progression   Progression Continue to progress workloads to maintain intensity without signs/symptoms of physical distress.     Resistance Training   Training Prescription Yes   Weight blue bands   Reps 10-15   Time 10 Minutes     Interval Training   Interval Training No     NuStep   Level 7   Minutes 17   METs 3.8     Track   Laps 40   Minutes 17      History  Smoking Status  . Never Smoker  Smokeless Tobacco  . Never Used    Goals Met:  Independence with exercise equipment Improved SOB with ADL's Using PLB without cueing & demonstrates good technique Exercise tolerated well No report of cardiac concerns or symptoms Strength training completed today  Goals Unmet:  Not Applicable  Comments: Service time is from 1330 to 1500   Dr. Rush Farmer is Medical Director for Pulmonary Rehab at [facility].

## 2017-01-13 ENCOUNTER — Ambulatory Visit (INDEPENDENT_AMBULATORY_CARE_PROVIDER_SITE_OTHER): Payer: Medicare Other | Admitting: Cardiology

## 2017-01-13 ENCOUNTER — Encounter: Payer: Self-pay | Admitting: Cardiology

## 2017-01-13 VITALS — BP 118/82 | HR 72 | Ht 69.0 in | Wt 224.0 lb

## 2017-01-13 DIAGNOSIS — C3411 Malignant neoplasm of upper lobe, right bronchus or lung: Secondary | ICD-10-CM | POA: Diagnosis not present

## 2017-01-13 DIAGNOSIS — I2510 Atherosclerotic heart disease of native coronary artery without angina pectoris: Secondary | ICD-10-CM

## 2017-01-13 DIAGNOSIS — I340 Nonrheumatic mitral (valve) insufficiency: Secondary | ICD-10-CM

## 2017-01-13 DIAGNOSIS — E785 Hyperlipidemia, unspecified: Secondary | ICD-10-CM

## 2017-01-13 NOTE — Patient Instructions (Signed)
Go ahead and schedule follow up with Dr. Roxy Manns  I will see you in 3 months

## 2017-01-14 ENCOUNTER — Encounter (HOSPITAL_COMMUNITY)
Admission: RE | Admit: 2017-01-14 | Discharge: 2017-01-14 | Disposition: A | Discharge status: Home / Self Care | Payer: Medicare Other | Source: Ambulatory Visit | Attending: Thoracic Surgery (Cardiothoracic Vascular Surgery) | Admitting: Thoracic Surgery (Cardiothoracic Vascular Surgery)

## 2017-01-14 VITALS — Wt 224.0 lb

## 2017-01-14 DIAGNOSIS — C3491 Malignant neoplasm of unspecified part of right bronchus or lung: Secondary | ICD-10-CM | POA: Diagnosis not present

## 2017-01-14 NOTE — Progress Notes (Signed)
Daily Session Note  Patient Details  Name: James [name] MRN: [id number] Date of Birth: 1951/03/31 Referring Provider:     Pulmonary Rehab Walk Test from 10/29/2016 in [place]  Referring Provider  Dr. [name]      Encounter Date: 01/14/2017  Check In:     Session Check In - 01/14/17 1533      Check-In   Location [facility]   Staff Present Su Hilt, MS, ACSM RCEP, Exercise Physiologist;Beau Ramsburg Ysidro Evert, RN;Other;Portia Rollene Rotunda, RN, BSN   Supervising physician immediately available to respond to emergencies [facility] immediately available   Physician(s) Dr. Bonner Puna   Medication changes reported     No   Fall or balance concerns reported    No   Tobacco Cessation No Change   Warm-up and Cool-down Performed as group-led instruction   Resistance Training Performed Yes   VAD Patient? No     Pain Assessment   Currently in Pain? No/denies   Multiple Pain Sites No      Capillary Blood Glucose: No results found for this or any previous visit (from the past 24 hour(s)).      Exercise Prescription Changes - 01/14/17 1500      Response to Exercise   Blood Pressure (Admit) 150/68   Blood Pressure (Exercise) 150/80   Blood Pressure (Exit) 120/78   Heart Rate (Admit) 79 bpm   Heart Rate (Exercise) 113 bpm   Heart Rate (Exit) 93 bpm   Oxygen Saturation (Admit) 96 %   Oxygen Saturation (Exercise) 93 %   Oxygen Saturation (Exit) 96 %   Rating of Perceived Exertion (Exercise) 12   Perceived Dyspnea (Exercise) 3   Duration Continue with 45 min of aerobic exercise without signs/symptoms of physical distress.   Intensity THRR unchanged     Progression   Progression Continue to progress workloads to maintain intensity without signs/symptoms of physical distress.     Resistance Training   Training Prescription Yes   Weight blue bands   Reps 10-15   Time 10 Minutes     Interval Training   Interval Training No     NuStep   Level 7   Minutes 17   METs 5.2     Rower   Level 21   Minutes 17      History  Smoking Status  . Never Smoker  Smokeless Tobacco  . Never Used    Goals Met:  Exercise tolerated well No report of cardiac concerns or symptoms Strength training completed today  Goals Unmet:  Not Applicable  Comments: Service time is from 1330 to 1500    Dr. Rush Farmer is Medical Director for Pulmonary Rehab at [facility]

## 2017-01-19 ENCOUNTER — Encounter (HOSPITAL_COMMUNITY)
Admission: RE | Admit: 2017-01-19 | Discharge: 2017-01-19 | Disposition: A | Discharge status: Home / Self Care | Payer: Medicare Other | Source: Ambulatory Visit | Attending: Thoracic Surgery (Cardiothoracic Vascular Surgery) | Admitting: Thoracic Surgery (Cardiothoracic Vascular Surgery)

## 2017-01-19 VITALS — Wt 223.8 lb

## 2017-01-19 DIAGNOSIS — C3491 Malignant neoplasm of unspecified part of right bronchus or lung: Secondary | ICD-10-CM | POA: Diagnosis not present

## 2017-01-19 NOTE — Progress Notes (Signed)
Daily Session Note  Patient Details  Name: James [name] MRN: [id number] Date of Birth: [birth year] Referring Provider:     Pulmonary Rehab Walk Test from 10/29/2016 in [place]  Referring Provider  Dr. [name]      Encounter Date: 01/19/2017  Check In:     Session Check In - 01/19/17 1330      Check-In   Location [facility]   Staff Present Ramon Dredge, RN, MHA;Mykiah Schmuck Rollene Rotunda, RN, BSN;Molly diVincenzo, MS, ACSM RCEP, Exercise Physiologist   Supervising physician immediately available to respond to emergencies [facility] immediately available   Physician(s) Dr. Bonner Puna   Medication changes reported     No   Fall or balance concerns reported    No   Tobacco Cessation No Change   Warm-up and Cool-down Performed as group-led instruction   Resistance Training Performed Yes   VAD Patient? No     Pain Assessment   Currently in Pain? No/denies   Multiple Pain Sites No      Capillary Blood Glucose: No results found for this or any previous visit (from the past 24 hour(s)).      Exercise Prescription Changes - 01/19/17 1531      Response to Exercise   Blood Pressure (Admit) 120/66   Blood Pressure (Exercise) 140/80   Blood Pressure (Exit) 122/72   Heart Rate (Admit) 76 bpm   Heart Rate (Exercise) 107 bpm   Heart Rate (Exit) 85 bpm   Oxygen Saturation (Admit) 96 %   Oxygen Saturation (Exercise) 93 %   Oxygen Saturation (Exit) 95 %   Rating of Perceived Exertion (Exercise) 12   Perceived Dyspnea (Exercise) 3   Duration Continue with 45 min of aerobic exercise without signs/symptoms of physical distress.   Intensity THRR unchanged     Progression   Progression Continue to progress workloads to maintain intensity without signs/symptoms of physical distress.     Resistance Training   Training Prescription Yes   Weight blue bands   Reps 10-15   Time 10 Minutes     Interval Training   Interval Training  No     Rower   Level 6   Minutes 17     Track   Laps 18   Minutes 17      History  Smoking Status  . Never Smoker  Smokeless Tobacco  . Never Used    Goals Met:  Independence with exercise equipment Using PLB without cueing & demonstrates good technique Exercise tolerated well No report of cardiac concerns or symptoms Strength training completed today  Goals Unmet:  Not Applicable  Comments: Service time is from 1330 to 1530   Dr. Rush Farmer is Medical Director for Pulmonary Rehab at [facility].

## 2017-01-21 ENCOUNTER — Encounter (HOSPITAL_COMMUNITY)
Admission: RE | Admit: 2017-01-21 | Discharge: 2017-01-21 | Disposition: A | Discharge status: Home / Self Care | Payer: Medicare Other | Source: Ambulatory Visit | Attending: Thoracic Surgery (Cardiothoracic Vascular Surgery) | Admitting: Thoracic Surgery (Cardiothoracic Vascular Surgery)

## 2017-01-21 VITALS — Wt 225.1 lb

## 2017-01-21 DIAGNOSIS — C3491 Malignant neoplasm of unspecified part of right bronchus or lung: Secondary | ICD-10-CM

## 2017-01-21 NOTE — Progress Notes (Signed)
Daily Session Note  Patient Details  Name: James [name] MRN: [id number] Date of Birth: August 29, 1950 Referring Provider:     Pulmonary Rehab Walk Test from 10/29/2016 in [place]  Referring Provider  Dr. [name]      Encounter Date: 01/21/2017  Check In:     Session Check In - 01/21/17 1330      Check-In   [facility]   Staff Present Su Hilt, MS, ACSM RCEP, Exercise Physiologist;Angeliyah Kirkey Rollene Rotunda, RN, BSN   Supervising physician immediately available to respond to emergencies [facility] immediately available   Physician(s) Dr. Carolin Sicks   Medication changes reported     No   Fall or balance concerns reported    No   Tobacco Cessation No Change   Warm-up and Cool-down Performed as group-led instruction   Resistance Training Performed Yes   VAD Patient? No     Pain Assessment   Currently in Pain? No/denies   Multiple Pain Sites No      Capillary Blood Glucose: No results found for this or any previous visit (from the past 24 hour(s)).      Exercise Prescription Changes - 01/21/17 1521      Response to Exercise   Blood Pressure (Admit) 140/52   Blood Pressure (Exercise) 130/70   Blood Pressure (Exit) 124/64   Heart Rate (Admit) 77 bpm   Heart Rate (Exercise) 124 bpm   Heart Rate (Exit) 88 bpm   Oxygen Saturation (Admit) 95 %   Oxygen Saturation (Exercise) 90 %   Oxygen Saturation (Exit) 95 %   Rating of Perceived Exertion (Exercise) 13   Perceived Dyspnea (Exercise) 3   Duration Continue with 45 min of aerobic exercise without signs/symptoms of physical distress.   Intensity THRR unchanged     Progression   Progression Continue to progress workloads to maintain intensity without signs/symptoms of physical distress.     Resistance Training   Training Prescription Yes   Weight blue bands   Reps 10-15   Time 10 Minutes     Interval Training   Interval Training No     NuStep   Level 7    Minutes 17   METs 3.4     Track   Laps 35   Minutes 34      History  Smoking Status  . Never Smoker  Smokeless Tobacco  . Never Used    Goals Met:  Independence with exercise equipment Using PLB without cueing & demonstrates good technique Exercise tolerated well No report of cardiac concerns or symptoms Strength training completed today  Goals Unmet:  PD  Comments: Service time is from 1330 to 1450   Dr. Rush Farmer is Medical Director for Pulmonary Rehab at [facility]

## 2017-01-26 ENCOUNTER — Encounter (HOSPITAL_COMMUNITY)
Admission: RE | Admit: 2017-01-26 | Discharge: 2017-01-26 | Disposition: A | Discharge status: Home / Self Care | Payer: Medicare Other | Source: Ambulatory Visit | Attending: Thoracic Surgery (Cardiothoracic Vascular Surgery) | Admitting: Thoracic Surgery (Cardiothoracic Vascular Surgery)

## 2017-01-26 VITALS — Wt 226.6 lb

## 2017-01-26 DIAGNOSIS — C3491 Malignant neoplasm of unspecified part of right bronchus or lung: Secondary | ICD-10-CM | POA: Diagnosis not present

## 2017-01-26 NOTE — Progress Notes (Signed)
Daily Session Note  Patient Details  Name: James [name] MRN: [id number] Date of Birth: 11/05/1950 Referring Provider:     Pulmonary Rehab Walk Test from 10/29/2016 in [facility]  Referring Provider  Dr. [name]      Encounter Date: 01/26/2017  Check In:     Session Check In - 01/26/17 1330      Check-In   Staff Present Su Hilt, MS, ACSM RCEP, Exercise Physiologist;Lisa Ysidro Evert, RN;Sandor Arboleda Rollene Rotunda, RN, BSN   Supervising physician immediately available to respond to emergencies [facility] immediately available   Physician(s) Dr. Carolin Sicks   Medication changes reported     No   Fall or balance concerns reported    No   Tobacco Cessation No Change   Warm-up and Cool-down Performed as group-led instruction   Resistance Training Performed Yes   VAD Patient? No     Pain Assessment   Currently in Pain? No/denies   Multiple Pain Sites No      Capillary Blood Glucose: No results found for this or any previous visit (from the past 24 hour(s)).      Exercise Prescription Changes - 01/26/17 1618      Response to Exercise   Blood Pressure (Admit) 120/74   Blood Pressure (Exercise) 132/72   Blood Pressure (Exit) 122/64   Heart Rate (Admit) 67 bpm   Heart Rate (Exercise) 114 bpm   Heart Rate (Exit) 90 bpm   Oxygen Saturation (Admit) 96 %   Oxygen Saturation (Exercise) 91 %   Oxygen Saturation (Exit) 94 %   Rating of Perceived Exertion (Exercise) 13   Perceived Dyspnea (Exercise) 3   Duration Continue with 45 min of aerobic exercise without signs/symptoms of physical distress.   Intensity THRR unchanged     Progression   Progression Continue to progress workloads to maintain intensity without signs/symptoms of physical distress.     Resistance Training   Training Prescription Yes   Weight blue bands   Reps 10-15   Time 10 Minutes     Interval Training   Interval Training No     NuStep   Level 7   Minutes 17   METs 3.4      Rower   Level 4   Minutes 17     Track   Laps 19   Minutes 17      History  Smoking Status  . Never Smoker  Smokeless Tobacco  . Never Used    Goals Met:  Independence with exercise equipment Using PLB without cueing & demonstrates good technique Exercise tolerated well No report of cardiac concerns or symptoms Strength training completed today  Goals Unmet:  PD  Comments: Service time is from 1330 to 1505   Dr. Rush Farmer is Medical Director for Pulmonary Rehab at [facility].

## 2017-01-28 ENCOUNTER — Encounter (HOSPITAL_COMMUNITY)
Admission: RE | Admit: 2017-01-28 | Discharge: 2017-01-28 | Disposition: A | Discharge status: Home / Self Care | Payer: Medicare Other | Source: Ambulatory Visit | Attending: Thoracic Surgery (Cardiothoracic Vascular Surgery) | Admitting: Thoracic Surgery (Cardiothoracic Vascular Surgery)

## 2017-01-28 VITALS — Wt 222.9 lb

## 2017-01-28 DIAGNOSIS — C3491 Malignant neoplasm of unspecified part of right bronchus or lung: Secondary | ICD-10-CM

## 2017-01-28 NOTE — Progress Notes (Signed)
Daily Session Note  Patient Details  Name: James [name] MRN: [id number] Date of Birth: 11/28/1950 Referring Provider:     Pulmonary Rehab Walk Test from 10/29/2016 in [facility]  Referring Provider  Dr. [name]      Encounter Date: 01/28/2017  Check In:     Session Check In - 01/28/17 1347      Check-In   Location [facility]   Staff Present Su Hilt, MS, ACSM RCEP, Exercise Physiologist;Katalin Colledge Rollene Rotunda, RN, BSN   Supervising physician immediately available to respond to emergencies [facility] immediately available   Physician(s) Dr. Wynelle Cleveland   Medication changes reported     No   Fall or balance concerns reported    No   Tobacco Cessation No Change   Warm-up and Cool-down Performed as group-led instruction   Resistance Training Performed Yes   VAD Patient? No     Pain Assessment   Currently in Pain? No/denies   Multiple Pain Sites No      Capillary Blood Glucose: No results found for this or any previous visit (from the past 24 hour(s)).      Exercise Prescription Changes - 01/28/17 1622      Response to Exercise   Blood Pressure (Admit) 138/64   Blood Pressure (Exercise) 154/76   Blood Pressure (Exit) 120/70   Heart Rate (Admit) 89 bpm   Heart Rate (Exercise) 134 bpm   Heart Rate (Exit) 91 bpm   Oxygen Saturation (Admit) 97 %   Oxygen Saturation (Exercise) 91 %   Oxygen Saturation (Exit) 94 %   Rating of Perceived Exertion (Exercise) 13   Perceived Dyspnea (Exercise) 2   Duration Continue with 45 min of aerobic exercise without signs/symptoms of physical distress.   Intensity THRR unchanged     Progression   Progression Continue to progress workloads to maintain intensity without signs/symptoms of physical distress.     Resistance Training   Training Prescription Yes   Weight blue bands   Reps 10-15   Time 10 Minutes     Interval Training   Interval Training No     NuStep   Level 7   Minutes 17   METs 4.1     Track   Laps 21   Minutes 17      History  Smoking Status  . Never Smoker  Smokeless Tobacco  . Never Used    Goals Met:  Independence with exercise equipment Using PLB without cueing & demonstrates good technique Exercise tolerated well No report of cardiac concerns or symptoms Strength training completed today  Goals Unmet:  PD  Comments: Service time is from 1330 to 1530   Dr. Rush Farmer is Medical Director for Pulmonary Rehab at [facility].

## 2017-01-29 NOTE — Progress Notes (Signed)
Pulmonary Individual Treatment Plan  Patient Details  Name: James [name] MRN: [id number] Date of Birth: 07-02-1950 Referring Provider:     Pulmonary Rehab Walk Test from 10/29/2016 in [place]  Referring Provider  Dr. [name]      Initial Encounter Date:    Pulmonary Rehab Walk Test from 10/29/2016 in [place]  Date  10/29/16  Referring Provider  Dr. [name]      Visit Diagnosis: Adenocarcinoma of right lung [facility])  Patient's Home Medications on Admission:   Current Outpatient Prescriptions:  .  acetaminophen (TYLENOL) 325 MG tablet, Take 650 mg by mouth every 6 (six) hours as needed for moderate pain or headache., Disp: , Rfl:  .  aspirin EC 81 MG tablet, Take 1 tablet (81 mg total) by mouth daily., Disp: 90 tablet, Rfl: 3 .  atorvastatin (LIPITOR) 20 MG tablet, Take 1 tablet (20 mg total) by mouth daily., Disp: 90 tablet, Rfl: 3 .  FLAXSEED, LINSEED, PO, Take 20 mLs by mouth daily. Sprinkled in drink daily, Disp: , Rfl:  .  furosemide (LASIX) 40 MG tablet, TAKE 1 TABLET BY MOUTH EVERY DAY, Disp: 90 tablet, Rfl: 3 .  Multiple Vitamin (MULTIVITAMIN) tablet, Take 1 tablet by mouth daily., Disp: , Rfl:  .  Omega-3 Fatty Acids (FISH OIL) 500 MG CAPS, Take 500 mg by mouth daily. , Disp: , Rfl:  .  potassium chloride SA (K-DUR,KLOR-CON) 20 MEQ tablet, Take 1 tablet (20 mEq total) by mouth daily., Disp: 30 tablet, Rfl: 1 .  psyllium (METAMUCIL) 58.6 % packet, Take 1 packet by mouth daily., Disp: , Rfl:  .  rivaroxaban (XARELTO) 20 MG TABS tablet, Take 1 tablet (20 mg total) by mouth daily with supper., Disp: 30 tablet, Rfl: 1 .  Rivaroxaban 15 & 20 MG TBPK, Take as directed on package: Start with one 71m tablet by mouth twice a day with food. On Day 22, switch to one 244mtablet once a day with food., Disp: 51 each, Rfl: 0 .  sodium chloride (OCEAN) 0.65 % SOLN nasal spray, Place 2 sprays into both nostrils at  bedtime., Disp: , Rfl:  .  Tetrahydrozoline-Zn Sulfate (ALLERGY RELIEF EYE DROPS OP), Apply 2 drops to eye daily as needed (allergy)., Disp: , Rfl:  .  traMADol (ULTRAM) 50 MG tablet, Take 50 mg by mouth every 4-6 hours PRN severe pain. (Patient taking differently: Take 50 mg by mouth every 4 (four) hours as needed for moderate pain. ), Disp: 30 tablet, Rfl: 0 .  Undecylenic Ac-Zn Undecylenate (FUNGI-NAIL TOE & FOOT EX), Apply 1 application topically daily., Disp: , Rfl:   Past Medical History: Past Medical History:  Diagnosis Date  . Adenocarcinoma of right lung, stage 1 ([facility]  . Anxiety   . Arthritis    "knees, hips, back" (10/19/2012)  . Chronic lower back pain   . Colon polyps    10/27/2002, repeat letter 09/17/2007  . Coronary artery disease   . Depressive disorder, not elsewhere classified   . Diabetes mellitus without complication (HCC)    diet controlled  . Fasting hyperglycemia   . GERD (gastroesophageal reflux disease)   . Heart murmur   . Hemoptysis    abnormal CT Chest 01/29/10 - ? new GG changes RUL > not viz on plain cxr 02/26/2010  . Mitral regurgitation    severe MR 08/2016  . MPN (myeloproliferative neoplasm) ([place]   1st detected 06/04/1998  . Obesity   .  OSA on CPAP    last sleep study 10 years ago  . Other and unspecified hyperlipidemia   . Positive PPD 1965   "non reactive in 2012" (10/19/2012)  . Routine general medical examination at a health care facility   . Special screening for malignant neoplasm of prostate   . Spinal stenosis, unspecified region other than cervical   . Wrist pain, left     Tobacco Use: History  Smoking Status  . Never Smoker  Smokeless Tobacco  . Never Used    Labs: Recent Review Flowsheet Data    Labs for ITP Cardiac and Pulmonary Rehab Latest Ref Rng & Units 07/24/2015 06/29/2016 08/11/2016 08/11/2016 08/31/2016   Cholestrol 0 - 200 mg/dL 246(H) 245(H) - - -   LDLCALC 0 - 99 mg/dL - 170(H) - - -   LDLDIRECT mg/dL 177.0 -  - - -   HDL >39.00 mg/dL 40.30 35.90(L) - - -   Trlycerides 0.0 - 149.0 mg/dL 211.0(H) 198.0(H) - - -   Hemoglobin A1c 4.8 - 5.6 % 6.5 6.4 - - 6.0(H)   PHART 7.350 - 7.450 - - - 7.305(L) 7.428   PCO2ART 32.0 - 48.0 mmHg - - - 46.9 35.0   HCO3 20.0 - 28.0 mmol/L - - 23.8 23.3 22.7   TCO2 0 - 100 mmol/L - - 25 25 -   ACIDBASEDEF 0.0 - 2.0 mmol/L - - 2.0 3.0(H) 1.1   O2SAT % - - 75.0 97.0 97.3      Capillary Blood Glucose: Lab Results  Component Value Date   GLUCAP 110 (H) 09/07/2016   GLUCAP 105 (H) 09/06/2016   GLUCAP 124 (H) 09/06/2016   GLUCAP 123 (H) 09/06/2016   GLUCAP 126 (H) 09/05/2016     Pulmonary Assessment Scores:     Pulmonary Assessment Scores    Row Name 11/07/16 1508         ADL UCSD   ADL Phase Entry     SOB Score total 43       CAT Score   CAT Score 16  Entry        Pulmonary Function Assessment:     Pulmonary Function Assessment - 10/19/16 1052      Breath   Bilateral Breath Sounds Clear   Shortness of Breath Yes      Exercise Target Goals:    Exercise Program Goal: Individual exercise prescription set with THRR, safety & activity barriers. Participant demonstrates ability to understand and report RPE using BORG scale, to self-measure pulse accurately, and to acknowledge the importance of the exercise prescription.  Exercise Prescription Goal: Starting with aerobic activity 30 plus minutes a day, 3 days per week for initial exercise prescription. Provide home exercise prescription and guidelines that participant acknowledges understanding prior to discharge.  Activity Barriers & Risk Stratification:   6 Minute Walk:     6 Minute Walk    Row Name 10/29/16 1626         6 Minute Walk   Phase Discharge     Distance 1547 feet     Walk Time 6 minutes     # of Rest Breaks 0     MPH 2.92     METS 3.22     RPE 15     Perceived Dyspnea  4     Symptoms No     Resting HR 87 bpm     Resting BP 155/83     Max Ex. HR 149 bpm  Max Ex. BP 182/84     2 Minute Post BP 159/84       Interval HR   Baseline HR (retired) 87     1 Minute HR 112     2 Minute HR 127     3 Minute HR 130     4 Minute HR 134     5 Minute HR 134     6 Minute HR 149     2 Minute Post HR 102     Interval Heart Rate? Yes       Interval Oxygen   Interval Oxygen? Yes     Baseline Oxygen Saturation % 96 %     Resting Liters of Oxygen 0 L     1 Minute Oxygen Saturation % 95 %     1 Minute Liters of Oxygen 0 L     2 Minute Oxygen Saturation % 91 %     2 Minute Liters of Oxygen 0 L     3 Minute Oxygen Saturation % 89 %     3 Minute Liters of Oxygen 0 L     4 Minute Oxygen Saturation % 89 %     4 Minute Liters of Oxygen 0 L     5 Minute Oxygen Saturation % 88 %     5 Minute Liters of Oxygen 0 L     6 Minute Oxygen Saturation % 86 %     6 Minute Liters of Oxygen 0 L     2 Minute Post Oxygen Saturation % 96 %     2 Minute Post Liters of Oxygen 0 L        Oxygen Initial Assessment:     Oxygen Initial Assessment - 10/29/16 1631      Initial 6 min Walk   Oxygen Used None   Resting Oxygen Saturation  96 %   Exercise Oxygen Saturation  during 6 min walk 86 %  will monitor-patient ran at the end of 63mt     Program Oxygen Prescription   Program Oxygen Prescription None      Oxygen Re-Evaluation:     Oxygen Re-Evaluation    Row Name 11/10/16 0536408/29/18 2129 01/29/17 1326         Program Oxygen Prescription   Program Oxygen Prescription None None None       Home Oxygen   Home Oxygen Device None None None     Sleep Oxygen Prescription None None None     Home Exercise Oxygen Prescription None None None     Home at Rest Exercise Oxygen Prescription None None None        Oxygen Discharge (Final Oxygen Re-Evaluation):     Oxygen Re-Evaluation - 01/29/17 1326      Program Oxygen Prescription   Program Oxygen Prescription None     Home Oxygen   Home Oxygen Device None   Sleep Oxygen Prescription None   Home  Exercise Oxygen Prescription None   Home at Rest Exercise Oxygen Prescription None      Initial Exercise Prescription:     Initial Exercise Prescription - 10/29/16 1600      Date of Initial Exercise RX and Referring Provider   Date 10/29/16   Referring Provider Dr. HReginold Agent  Level 2   Minutes 17     NuStep   Level 2   Minutes 17   METs 1.5     Track   Laps 10  Minutes 17     Prescription Details   Frequency (times per week) 1   Duration Progress to 45 minutes of aerobic exercise without signs/symptoms of physical distress     Intensity   THRR 40-80% of Max Heartrate 61-122   Ratings of Perceived Exertion 11-13   Perceived Dyspnea 0-4     Progression   Progression Continue progressive overload as per policy without signs/symptoms or physical distress.     Resistance Training   Training Prescription Yes   Weight blue bands   Reps 10-15      Perform Capillary Blood Glucose checks as needed.  Exercise Prescription Changes:     Exercise Prescription Changes    Row Name 11/05/16 1600 11/10/16 1500 11/12/16 1500 11/17/16 1500 11/19/16 1500     Response to Exercise   Blood Pressure (Admit) 136/80 120/70 140/64 136/72 136/64   Blood Pressure (Exercise) 160/90 136/70 144/76 180/90 170/80   Blood Pressure (Exit) 122/80 124/70 126/74 112/80 118/62   Heart Rate (Admit) 98 bpm 82 bpm 86 bpm 73 bpm 80 bpm   Heart Rate (Exercise) 109 bpm 121 bpm 123 bpm 119 bpm 122 bpm   Heart Rate (Exit) 90 bpm 86 bpm 77 bpm 88 bpm 84 bpm   Oxygen Saturation (Admit) 95 % 97 % 96 % 97 % 97 %   Oxygen Saturation (Exercise) 93 % 88 % 92 % 89 % 90 %   Oxygen Saturation (Exit) 95 % 96 % 96 % 95 % 96 %   Rating of Perceived Exertion (Exercise) _0 Perceived Dyspnea (Exercise) _1 Duration Progress to 45 minutes of aerobic exercise without signs/symptoms of physical distress Progress to 45 minutes of aerobic exercise without signs/symptoms of physical  distress Progress to 45 minutes of aerobic exercise without signs/symptoms of physical distress Continue with 45 min of aerobic exercise without signs/symptoms of physical distress. Continue with 45 min of aerobic exercise without signs/symptoms of physical distress.   Intensity _2      Progression   Progression Continue to progress workloads to maintain intensity without signs/symptoms of physical distress. Continue to progress workloads to maintain intensity without signs/symptoms of physical distress. Continue to progress workloads to maintain intensity without signs/symptoms of physical distress. Continue to progress workloads to maintain intensity without signs/symptoms of physical distress. Continue to progress workloads to maintain intensity without signs/symptoms of physical distress.     Resistance Training   Training Prescription _3    Weight _4    Reps 10-15 10-15 10-15 10-15 10-15   Time 10 Minutes 10 Minutes 10 Minutes 10 Minutes 10 Minutes     Interval Training   Interval Training _5      NuStep   Level _6 Minutes _7 METs 2 2.6 1.7 2.4 2.7     Arm Ergometer   Level  - 6  -  -  -   Minutes  - 17  -  -  -     Rower   Level  -  -  - 4  -   Minutes  -  -  - 17  -     Track   Laps _8 Minutes _9 17  Home Exercise Plan   Plans to continue exercise at  -  - Home (comment)  -  -   Frequency  -  - Add 3 additional days to program exercise sessions.  -  -   Row Name 11/24/16 1500 11/26/16 1500 12/08/16 1500 12/10/16 1556 12/15/16 1500     Response to Exercise   Blood Pressure (Admit) 132/64 150/64 134/78 136/64 160/84   Blood Pressure (Exercise) 130/70 182/94 170/80 126/74 186/82   Blood Pressure (Exit) 126/68 124/72 122/60 120/66 128/72   Heart Rate (Admit) 76 bpm 81 bpm 80 bpm  79 bpm 80 bpm   Heart Rate (Exercise) 109 bpm 106 bpm 118 bpm 109 bpm 122 bpm   Heart Rate (Exit) 94 bpm 96 bpm 97 bpm 82 bpm 95 bpm   Oxygen Saturation (Admit) 96 % 96 % 96 % 97 % 97 %   Oxygen Saturation (Exercise) 94 % 95 % 93 % 93 % 87 %   Oxygen Saturation (Exit) 95 % 96 % 96 % 95 % 96 %   Rating of Perceived Exertion (Exercise) _0 Perceived Dyspnea (Exercise) _1 Duration Continue with 30 min of aerobic exercise without signs/symptoms of physical distress. Continue with 45 min of aerobic exercise without signs/symptoms of physical distress. Continue with 45 min of aerobic exercise without signs/symptoms of physical distress. Continue with 45 min of aerobic exercise without signs/symptoms of physical distress. Continue with 45 min of aerobic exercise without signs/symptoms of physical distress.   Intensity _2      Progression   Progression Continue to progress workloads to maintain intensity without signs/symptoms of physical distress. Continue to progress workloads to maintain intensity without signs/symptoms of physical distress. Continue to progress workloads to maintain intensity without signs/symptoms of physical distress. Continue to progress workloads to maintain intensity without signs/symptoms of physical distress. Continue to progress workloads to maintain intensity without signs/symptoms of physical distress.     Resistance Training   Training Prescription _3    Weight _4    Reps 10-15 10-15 10-15 10-15 10-15   Time 10 Minutes 10 Minutes 10 Minutes 10 Minutes 10 Minutes     Interval Training   Interval Training _5      NuStep   Level 7  - _6 Minutes 17  - _7 METs 2.7  - 3.4 3.4 3.4     Arm/Foot Ergometer   Level  -  -  - 7  -   Minutes  -  -  - 17  -   METs  -  -  - 3.1  -     Rower    Level  - 4 4  - 4   Minutes  - 17 17  - 17     Track   Laps _8 Minutes _9 Row Name 12/17/16 1606 12/22/16 1500 12/24/16 1500 12/31/16 1500 01/05/17 1608     Response to Exercise   Blood Pressure (Admit) 142/70 140/76 146/70 138/66 126/60   Blood Pressure (Exercise) 140/70 154/80 144/70 174/82 162/82   Blood Pressure (Exit) 118/70 122/62 110/60 128/74 134/68   Heart Rate (Admit) 95 bpm 82 bpm 84 bpm 83 bpm 80 bpm  Heart Rate (Exercise) 101 bpm 121 bpm 132 bpm 115 bpm 125 bpm   Heart Rate (Exit) 89 bpm 88 bpm 99 bpm 83 bpm 96 bpm   Oxygen Saturation (Admit) 96 % 96 % 96 % 95 % 96 %   Oxygen Saturation (Exercise) 96 % 93 % 91 % 89 % 90 %   Oxygen Saturation (Exit) 95 % 95 % 94 % 94 % 95 %   Rating of Perceived Exertion (Exercise) _0 Perceived Dyspnea (Exercise) _1 Duration Continue with 45 min of aerobic exercise without signs/symptoms of physical distress. Continue with 45 min of aerobic exercise without signs/symptoms of physical distress. Continue with 45 min of aerobic exercise without signs/symptoms of physical distress. Continue with 45 min of aerobic exercise without signs/symptoms of physical distress. Continue with 45 min of aerobic exercise without signs/symptoms of physical distress.   Intensity _2      Progression   Progression Continue to progress workloads to maintain intensity without signs/symptoms of physical distress. Continue to progress workloads to maintain intensity without signs/symptoms of physical distress. Continue to progress workloads to maintain intensity without signs/symptoms of physical distress. Continue to progress workloads to maintain intensity without signs/symptoms of physical distress. Continue to progress workloads to maintain intensity without signs/symptoms of physical distress.     Resistance Training   Training Prescription Yes  Yes Yes Yes Yes   Weight _3    Reps 10-15 10-15 10-15 10-15 10-15   Time 10 Minutes 10 Minutes 10 Minutes 10 Minutes 10 Minutes     Interval Training   Interval Training _4      NuStep   Level _5 Minutes _6 METs 4.4 5.1 3.3 3.7 3.8     Rower   Level 4 4  -  -  -   Minutes 17 17  -  -  -     Track   Laps  - 17 35 16 [place] Name 01/07/17 1603 01/12/17 1533 01/14/17 1500 01/19/17 1531 01/21/17 1521     Response to Exercise   Blood Pressure (Admit) 120/66 130/60 150/68 120/66 140/52   Blood Pressure (Exercise) 128/58 128/58 150/80 140/80 130/70   Blood Pressure (Exit) 118/68 118/68 120/78 122/72 124/64   Heart Rate (Admit) 83 bpm 83 bpm 79 bpm 76 bpm 77 bpm   Heart Rate (Exercise) 103 bpm 103 bpm 113 bpm 107 bpm 124 bpm   Heart Rate (Exit) 84 bpm 84 bpm 93 bpm 85 bpm 88 bpm   Oxygen Saturation (Admit) 96 % 96 % 96 % 96 % 95 %   Oxygen Saturation (Exercise) 94 % 91 % 93 % 93 % 90 %   Oxygen Saturation (Exit) 95 % 96 % 96 % 95 % 95 %   Rating of Perceived Exertion (Exercise) _7 Perceived Dyspnea (Exercise) _8 Duration Continue with 45 min of aerobic exercise without signs/symptoms of physical distress. Continue with 45 min of aerobic exercise without signs/symptoms of physical distress. Continue with 45 min of aerobic exercise without signs/symptoms of physical distress. Continue with 45 min of aerobic exercise without signs/symptoms of physical distress. Continue with 45  min of aerobic exercise without signs/symptoms of physical distress.   Intensity _0      Progression   Progression Continue to progress workloads to maintain intensity without signs/symptoms of physical distress. Continue to progress workloads to maintain intensity without signs/symptoms of physical distress.  Continue to progress workloads to maintain intensity without signs/symptoms of physical distress. Continue to progress workloads to maintain intensity without signs/symptoms of physical distress. Continue to progress workloads to maintain intensity without signs/symptoms of physical distress.     Resistance Training   Training Prescription _1    Weight _2    Reps 10-15 10-15 10-15 10-15 10-15   Time 10 Minutes 10 Minutes 10 Minutes 10 Minutes 10 Minutes     Interval Training   Interval Training _3      NuStep   Level _4 - 7   Minutes _5 - 17   METs 3.5 3.8 5.2  - 3.4     Rower   Level  -  - 21 6  -   Minutes  -  - 17 17  -     Track   Laps 22 40  - 18 35   Minutes [place] Name 01/26/17 1618 01/28/17 1622           Response to Exercise   Blood Pressure (Admit) 120/74 138/64      Blood Pressure (Exercise) 132/72 154/76      Blood Pressure (Exit) 122/64 120/70      Heart Rate (Admit) 67 bpm 89 bpm      Heart Rate (Exercise) 114 bpm 134 bpm      Heart Rate (Exit) 90 bpm 91 bpm      Oxygen Saturation (Admit) 96 % 97 %      Oxygen Saturation (Exercise) 91 % 91 %      Oxygen Saturation (Exit) 94 % 94 %      Rating of Perceived Exertion (Exercise) 13 13      Perceived Dyspnea (Exercise) 3 2      Duration Continue with 45 min of aerobic exercise without signs/symptoms of physical distress. Continue with 45 min of aerobic exercise without signs/symptoms of physical distress.      Intensity THRR unchanged THRR unchanged        Progression   Progression Continue to progress workloads to maintain intensity without signs/symptoms of physical distress. Continue to progress workloads to maintain intensity without signs/symptoms of physical distress.        Resistance Training   Training Prescription Yes Yes      Weight blue bands blue bands      Reps 10-15 10-15      Time 10 Minutes  10 Minutes        Interval Training   Interval Training No No        NuStep   Level 7 7      Minutes 17 17      METs 3.4 4.1        Rower   Level 4  -      Minutes 17  -        Track   Laps 19 21      Minutes 17 17         Exercise Comments:  Exercise Comments    Row Name 11/12/16 1541           Exercise Comments Home exercise completed          Exercise Goals and Review:   Exercise Goals Re-Evaluation :     Exercise Goals Re-Evaluation    Row Name 11/09/16 1144 12/04/16 1341 01/05/17 0951 01/25/17 1412       Exercise Goal Re-Evaluation   Exercise Goals Review Increase Physical Activity;Increase Strenth and Stamina Increase Strenth and Stamina;Increase Physical Activity Increase Strength and Stamina;Increase Physical Activity;Able to understand and use Dyspnea scale;Able to understand and use rate of perceived exertion (RPE) scale;Knowledge and understanding of Target Heart Rate Range (THRR);Understanding of Exercise Prescription Increase Strength and Stamina;Increase Physical Activity;Able to understand and use Dyspnea scale;Able to understand and use rate of perceived exertion (RPE) scale;Knowledge and understanding of Target Heart Rate Range (THRR);Understanding of Exercise Prescription    Comments Patient has only attended one exercise session. Will cont. to monitor and progress as able.  Patient is progressing well in program. Comes to each rehab session motivated to work hard. Struggles with shortness of breath. Home exercise completed. Will cont. to monitor and progress as able.  Patient is progressing well in program. Comes to each rehab session motivated to work hard. Struggles with shortness of breath. Walks up to 16 laps (200 feet each) in 15 minutes.  Home exercise completed. Will cont. to monitor and progress as able.  Patient is progressing well in program. Comes to each rehab session motivated to work hard. Struggles with shortness of breath. Walks up to  16 laps (200 feet each) in 15 minutes.  Home exercise completed. Will cont. to monitor and progress as able.     Expected Outcomes Through exercising at rehab and at home, patient will increase physical strength and stamina and find ADL's easier to perform.  Through exercising at rehab and at home, patient will increase physical strength and stamina and find ADL's easier to perform.  Through exercising at rehab and at home, patient will increase physical strength and stamina and find ADL's easier to perform.  Through exercising at rehab and at home, patient will increase physical strength and stamina and find ADL's easier to perform.        Discharge Exercise Prescription (Final Exercise Prescription Changes):     Exercise Prescription Changes - 01/28/17 1622      Response to Exercise   Blood Pressure (Admit) 138/64   Blood Pressure (Exercise) 154/76   Blood Pressure (Exit) 120/70   Heart Rate (Admit) 89 bpm   Heart Rate (Exercise) 134 bpm   Heart Rate (Exit) 91 bpm   Oxygen Saturation (Admit) 97 %   Oxygen Saturation (Exercise) 91 %   Oxygen Saturation (Exit) 94 %   Rating of Perceived Exertion (Exercise) 13   Perceived Dyspnea (Exercise) 2   Duration Continue with 45 min of aerobic exercise without signs/symptoms of physical distress.   Intensity THRR unchanged     Progression   Progression Continue to progress workloads to maintain intensity without signs/symptoms of physical distress.     Resistance Training   Training Prescription Yes   Weight blue bands   Reps 10-15   Time 10 Minutes     Interval Training   Interval Training No     NuStep   Level 7   Minutes 17   METs 4.1     Track   Laps 21   Minutes 17  Nutrition:  Target Goals: Understanding of nutrition guidelines, daily intake of sodium <1577m, cholesterol <2046m calories 30% from fat and 7% or less from saturated fats, daily to have 5 or more servings of fruits and  vegetables.  Biometrics:    Nutrition Therapy Plan and Nutrition Goals:     Nutrition Therapy & Goals - 11/24/16 1505      Nutrition Therapy   Diet Carb Modified, Therapeutic Lifestyle Changes     Personal Nutrition Goals   Nutrition Goal Pt to be able to name foods that affect blood sugar.    Personal Goal #2 Pt to maintain his wt around 220 lb until pt is ready to focus on wt loss.    Personal Goal #3 Describe the benefit of including fruits, vegetables, whole grains, and low-fat dairy products in a healthy meal plan.   Personal Goal #4 Pt to increase frequency of food consumed to at least a small snack (e.g. 1/4 cup nuts, Greek yogurt) every 5 hours.     Intervention Plan   Intervention Prescribe, educate and counsel regarding individualized specific dietary modifications aiming towards targeted core components such as weight, hypertension, lipid management, diabetes, heart failure and other comorbidities.   Expected Outcomes Short Term Goal: Understand basic principles of dietary content, such as calories, fat, sodium, cholesterol and nutrients.;Long Term Goal: Adherence to prescribed nutrition plan.      Nutrition Discharge: Rate Your Plate Scores:     Nutrition Assessments - 11/24/16 1322      Rate Your Plate Scores   Pre Score 51      Nutrition Goals Re-Evaluation:   Nutrition Goals Discharge (Final Nutrition Goals Re-Evaluation):   Psychosocial: Target Goals: Acknowledge presence or absence of significant depression and/or stress, maximize coping skills, provide positive support system. Participant is able to verbalize types and ability to use techniques and skills needed for reducing stress and depression.  Initial Review & Psychosocial Screening:     Initial Psych Review & Screening - 12/08/16 1621      Initial Review   Current issues with --      Quality of Life Scores:   PHQ-9: Recent Review Flowsheet Data    [facility]  07/29/2016 07/27/2016 07/28/2015 07/24/2015   Decreased Interest 1 0 0 0 0   Down, Depressed, Hopeless 3 0 0 0 0   PHQ - 2 Score 4 0 0 0 0   Altered sleeping 3 - - - -   Tired, decreased energy 3 - - - -   Change in appetite 3 - - - -   Feeling bad or failure about yourself  0 - - - -   Trouble concentrating 0 - - - -   Moving slowly or fidgety/restless 0 - - - -   Suicidal thoughts 0 - - - -   PHQ-9 Score 13 - - - -   Difficult doing work/chores Not difficult at all - - - -     Interpretation of Total Score  Total Score Depression Severity:  1-4 = Minimal depression, 5-9 = Mild depression, 10-14 = Moderate depression, 15-19 = Moderately severe depression, 20-27 = Severe depression   Psychosocial Evaluation and Intervention:     Psychosocial Evaluation - 11/10/16 0907      Psychosocial Evaluation & Interventions   Interventions Encouraged to exercise with the program and follow exercise prescription      Psychosocial Re-Evaluation:     Psychosocial Re-Evaluation    [place] 11/10/16  0908 12/08/16 1624 12/08/16 1625 01/06/17 2130 01/29/17 1328     Psychosocial Re-Evaluation   Current issues with None Identified  - Current Stress Concerns Current Stress Concerns None Identified   Comments  - patient is discouraged with his continue shortness of breath but is beginning to accept that he may need valve surgery inorder for it to improv  - patient has accepted the need for valve surgery. has appointment with cardiologist september 5 to discuss options  -   Interventions  -  - Encouraged to attend Pulmonary Rehabilitation for the exercise  encouraged patient to re-evaluate need for valve surgery with cardiothoracic surgeon Encouraged to attend Pulmonary Rehabilitation for the exercise Encouraged to attend Pulmonary Rehabilitation for the exercise   Continue Psychosocial Services  No Follow up required  - Follow up required by staff Follow up required by staff Follow up required by staff    Comments  -  - if his shortness of breath is related to his valve then pulmonary rehab may not improve his shortness of breath as he anticipated. if his shortness of breath is related to his valve then pulmonary rehab may not improve his shortness of breath as he anticipated.  -     Initial Review   Source of Stress Concerns  -  - Unable to participate in former interests or hobbies;Unable to perform yard/household activities Unable to participate in former interests or hobbies;Unable to perform yard/household activities  -      Psychosocial Discharge (Final Psychosocial Re-Evaluation):     Psychosocial Re-Evaluation - 01/29/17 1328      Psychosocial Re-Evaluation   Current issues with None Identified   Interventions Encouraged to attend Pulmonary Rehabilitation for the exercise   Continue Psychosocial Services  Follow up required by staff      Education: Education Goals: Education classes will be provided on a weekly basis, covering required topics. Participant will state understanding/return demonstration of topics presented.  Learning Barriers/Preferences:     Learning Barriers/Preferences - 10/19/16 1049      Learning Barriers/Preferences   Learning Barriers None   Learning Preferences Audio;Computer/Internet;Pictoral;Skilled Demonstration;Verbal Instruction;Video;Written Material;Individual Instruction;Group Instruction      Education Topics: Risk Factor Reduction:  -Group instruction that is supported by a PowerPoint presentation. Instructor discusses the definition of a risk factor, different risk factors for pulmonary disease, and how the heart and lungs work together.     Nutrition for Pulmonary Patient:  -Group instruction provided by PowerPoint slides, verbal discussion, and written materials to support subject matter. The instructor gives an explanation and review of healthy diet recommendations, which includes a discussion on weight management, recommendations  for fruit and vegetable consumption, as well as protein, fluid, caffeine, fiber, sodium, sugar, and alcohol. Tips for eating when patients are short of breath are discussed.   PULMONARY REHAB OTHER RESPIRATORY from 01/28/2017 in [place]  Date  12/31/16  Educator  RD  Instruction Review Code  2- meets goals/outcomes      Pursed Lip Breathing:  -Group instruction that is supported by demonstration and informational handouts. Instructor discusses the benefits of pursed lip and diaphragmatic breathing and detailed demonstration on how to preform both.     PULMONARY REHAB OTHER RESPIRATORY from 01/28/2017 in [place]  Date  01/14/17  Educator  RT  Instruction Review Code  2- meets goals/outcomes      Oxygen Safety:  -Group instruction provided by PowerPoint, verbal discussion, and written material  to support subject matter. There is an overview of "What is Oxygen" and "Why do we need it".  Instructor also reviews how to create a safe environment for oxygen use, the importance of using oxygen as prescribed, and the risks of noncompliance. There is a brief discussion on traveling with oxygen and resources the patient may utilize.   PULMONARY REHAB OTHER RESPIRATORY from 01/28/2017 in [place]  Date  12/17/16  Educator  Truddie Crumble  Instruction Review Code  2- meets goals/outcomes      Oxygen Equipment:  -Group instruction provided by [employer] Staff utilizing handouts, written materials, and equipment demonstrations.   Signs and Symptoms:  -Group instruction provided by written material and verbal discussion to support subject matter. Warning signs and symptoms of infection, stroke, and heart attack are reviewed and when to call the physician/911 reinforced. Tips for preventing the spread of infection discussed.   PULMONARY REHAB OTHER RESPIRATORY from 01/28/2017 in [place]  Date  01/28/17  Educator  rn  Instruction Review Code  2- meets goals/outcomes      Advanced Directives:  -Group instruction provided by verbal instruction and written material to support subject matter. Instructor reviews Advanced Directive laws and proper instruction for filling out document.   Pulmonary Video:  -Group video education that reviews the importance of medication and oxygen compliance, exercise, good nutrition, pulmonary hygiene, and pursed lip and diaphragmatic breathing for the pulmonary patient.   Exercise for the Pulmonary Patient:  -Group instruction that is supported by a PowerPoint presentation. Instructor discusses benefits of exercise, core components of exercise, frequency, duration, and intensity of an exercise routine, importance of utilizing pulse oximetry during exercise, safety while exercising, and options of places to exercise outside of rehab.     PULMONARY REHAB OTHER RESPIRATORY from 01/28/2017 in [place]  Date  01/19/17  Educator  ep  Instruction Review Code  R- Review/reinforce      Pulmonary Medications:  -Verbally interactive group education provided by instructor with focus on inhaled medications and proper administration.   PULMONARY REHAB OTHER RESPIRATORY from 01/28/2017 in [place]  Date  01/07/17  Educator  pharmacist  Instruction Review Code  2- meets goals/outcomes      Anatomy and Physiology of the Respiratory System and Intimacy:  -Group instruction provided by PowerPoint, verbal discussion, and written material to support subject matter. Instructor reviews respiratory cycle and anatomical components of the respiratory system and their functions. Instructor also reviews differences in obstructive and restrictive respiratory diseases with examples of each. Intimacy, Sex, and Sexuality differences are reviewed with a discussion on how relationships can  change when diagnosed with pulmonary disease. Common sexual concerns are reviewed.   PULMONARY REHAB OTHER RESPIRATORY from 01/28/2017 in [place]  Date  11/26/16  Educator  rn  Instruction Review Code  2- meets goals/outcomes      MD DAY -A group question and answer session with a medical doctor that allows participants to ask questions that relate to their pulmonary disease state.   PULMONARY REHAB OTHER RESPIRATORY from 01/28/2017 in [place]  Date  11/12/16  Educator  yacoub  Instruction Review Code  2- meets goals/outcomes      OTHER EDUCATION -Group or individual verbal, written, or video instructions that support the educational goals of the pulmonary rehab program.   Knowledge Questionnaire Score:  Knowledge Questionnaire Score - 11/07/16 1506      Knowledge Questionnaire Score   Pre Score 12/13      Core Components/Risk Factors/Patient Goals at Admission:     Personal Goals and Risk Factors at Admission - 10/19/16 1056      Core Components/Risk Factors/Patient Goals on Admission   Improve shortness of breath with ADL's Yes   Intervention Provide education, individualized exercise plan and daily activity instruction to help decrease symptoms of SOB with activities of daily living.   Expected Outcomes Short Term: Achieves a reduction of symptoms when performing activities of daily living.   Develop more efficient breathing techniques such as purse lipped breathing and diaphragmatic breathing; and practicing self-pacing with activity Yes   Increase knowledge of respiratory medications and ability to use respiratory devices properly  Yes   Intervention Provide education and demonstration as needed of appropriate use of medications, inhalers, and oxygen therapy.   Expected Outcomes Short Term: Achieves understanding of medications use. Understands that oxygen is a medication prescribed by physician.  Demonstrates appropriate use of inhaler and oxygen therapy.      Core Components/Risk Factors/Patient Goals Review:      Goals and Risk Factor Review    Row Name 11/10/16 0905 12/08/16 1617 01/06/17 2129 01/29/17 1326       Core Components/Risk Factors/Patient Goals Review   Personal Goals Review Improve shortness of breath with ADL's;Increase knowledge of respiratory medications and ability to use respiratory devices properly.;Develop more efficient breathing techniques such as purse lipped breathing and diaphragmatic breathing and practicing self-pacing with activity. Weight Management/Obesity;Improve shortness of breath with ADL's;Increase knowledge of respiratory medications and ability to use respiratory devices properly.;Develop more efficient breathing techniques such as purse lipped breathing and diaphragmatic breathing and practicing self-pacing with activity. Weight Management/Obesity;Improve shortness of breath with ADL's;Increase knowledge of respiratory medications and ability to use respiratory devices properly.;Develop more efficient breathing techniques such as purse lipped breathing and diaphragmatic breathing and practicing self-pacing with activity. Weight Management/Obesity;Improve shortness of breath with ADL's;Increase knowledge of respiratory medications and ability to use respiratory devices properly.;Develop more efficient breathing techniques such as purse lipped breathing and diaphragmatic breathing and practicing self-pacing with activity.    Review Has only attended 1 exercise session, too early to have met any goals patient has progressed well over the past 30 days in pulmonary rehab. he has tolerated workload increases although is limited by his shortness of breath. This may not be related to any pulmonary illness but to his need for a valve replacement. he is utilizing PLB when appropriate. He is beginning to ask question regarding the effects of a round abdomen on  breathing and has realized that if he can loose some weight his breathing may improve somewhat.  patient has progressed well over the past 30 days in pulmonary rehab. he has tolerated workload increases although is limited by his shortness of breath. This may not be related to any pulmonary illness but to his need for a valve replacement. he is utilizing PLB when appropriate. He is beginning to ask question regarding the effects of a round abdomen on breathing and has realized that if he can loose some weight his breathing may improve somewhat.  patient continues to progress well in pulmonary rehab. He is able to walk 21 laps in 15 min which is almost a mile. His shortness of breath is not improving and may be getting worse with exertions however this is valve related and unfortunately will not improve with  rehab. his cardiologist gave him clearence to continue to push himself at rehab.    Expected Outcomes expect to see more progression of goals in the next 30 days expect to see more progression of goals in the next 30 days expect to see more progression of goals in the next 30 days expect to see more progression of goals in the next 30 days       Core Components/Risk Factors/Patient Goals at Discharge (Final Review):      Goals and Risk Factor Review - 01/29/17 1326      Core Components/Risk Factors/Patient Goals Review   Personal Goals Review Weight Management/Obesity;Improve shortness of breath with ADL's;Increase knowledge of respiratory medications and ability to use respiratory devices properly.;Develop more efficient breathing techniques such as purse lipped breathing and diaphragmatic breathing and practicing self-pacing with activity.   Review patient continues to progress well in pulmonary rehab. He is able to walk 21 laps in 15 min which is almost a mile. His shortness of breath is not improving and may be getting worse with exertions however this is valve related and unfortunately will not  improve with rehab. his cardiologist gave him clearence to continue to push himself at rehab.   Expected Outcomes expect to see more progression of goals in the next 30 days      ITP Comments:   Comments: ITP REVIEW Pt has made expected progress toward all pulmonary rehab goals except improvement in his shortness of breath after completing 23 sessions. Recommend continued exercise, life style modification, education, and utilization of breathing techniques to increase stamina and strength and decrease shortness of breath with exertion.

## 2017-02-02 ENCOUNTER — Encounter (HOSPITAL_COMMUNITY)
Admission: RE | Admit: 2017-02-02 | Discharge: 2017-02-02 | Disposition: A | Discharge status: Home / Self Care | Payer: Medicare Other | Source: Ambulatory Visit | Attending: Thoracic Surgery (Cardiothoracic Vascular Surgery) | Admitting: Thoracic Surgery (Cardiothoracic Vascular Surgery)

## 2017-02-02 DIAGNOSIS — C3491 Malignant neoplasm of unspecified part of right bronchus or lung: Secondary | ICD-10-CM | POA: Diagnosis not present

## 2017-02-03 ENCOUNTER — Encounter (HOSPITAL_COMMUNITY): Payer: Self-pay | Admitting: *Deleted

## 2017-02-04 ENCOUNTER — Encounter (HOSPITAL_COMMUNITY): Admission: RE | Admit: 2017-02-04 | Payer: Medicare Other | Source: Ambulatory Visit

## 2017-02-08 ENCOUNTER — Encounter: Payer: Self-pay | Admitting: Thoracic Surgery (Cardiothoracic Vascular Surgery)

## 2017-02-08 ENCOUNTER — Other Ambulatory Visit: Payer: Self-pay | Admitting: *Deleted

## 2017-02-08 ENCOUNTER — Other Ambulatory Visit: Payer: Self-pay

## 2017-02-08 ENCOUNTER — Ambulatory Visit (INDEPENDENT_AMBULATORY_CARE_PROVIDER_SITE_OTHER): Payer: Medicare Other | Admitting: Thoracic Surgery (Cardiothoracic Vascular Surgery)

## 2017-02-08 VITALS — BP 139/86 | HR 69 | Resp 16 | Ht 69.0 in | Wt 225.8 lb

## 2017-02-08 DIAGNOSIS — I340 Nonrheumatic mitral (valve) insufficiency: Secondary | ICD-10-CM

## 2017-02-08 DIAGNOSIS — Z902 Acquired absence of lung [part of]: Secondary | ICD-10-CM | POA: Diagnosis not present

## 2017-02-08 DIAGNOSIS — I051 Rheumatic mitral insufficiency: Secondary | ICD-10-CM

## 2017-02-08 LAB — COMPREHENSIVE METABOLIC PANEL
AG Ratio: 1.8 (calc) (ref 1.0–2.5)
ALT: 29 U/L (ref 9–46)
AST: 20 U/L (ref 10–35)
Albumin: 4.1 g/dL (ref 3.6–5.1)
Alkaline phosphatase (APISO): 58 U/L (ref 40–115)
BUN: 18 mg/dL (ref 7–25)
CO2: 28 mmol/L (ref 20–32)
Calcium: 9.3 mg/dL (ref 8.6–10.3)
Chloride: 104 mmol/L (ref 98–110)
Creat: 1.1 mg/dL (ref 0.70–1.25)
Globulin: 2.3 g/dL (calc) (ref 1.9–3.7)
Glucose, Bld: 115 mg/dL (ref 65–139)
Potassium: 4.1 mmol/L (ref 3.5–5.3)
Sodium: 138 mmol/L (ref 135–146)
Total Bilirubin: 0.6 mg/dL (ref 0.2–1.2)
Total Protein: 6.4 g/dL (ref 6.1–8.1)

## 2017-02-08 LAB — CBC WITH DIFFERENTIAL/PLATELET
Basophils Absolute: 61 cells/uL (ref 0–200)
Basophils Relative: 0.9 %
Eosinophils Absolute: 122 cells/uL (ref 15–500)
Eosinophils Relative: 1.8 %
HCT: 44.9 % (ref 38.5–50.0)
Hemoglobin: 15.3 g/dL (ref 13.2–17.1)
Lymphs Abs: 1448 cells/uL (ref 850–3900)
MCH: 29.4 pg (ref 27.0–33.0)
MCHC: 34.1 g/dL (ref 32.0–36.0)
MCV: 86.2 fL (ref 80.0–100.0)
MPV: 10.5 fL (ref 7.5–12.5)
Monocytes Relative: 8.7 %
Neutro Abs: 4576 cells/uL (ref 1500–7800)
Neutrophils Relative %: 67.3 %
Platelets: 201 10*3/uL (ref 140–400)
RBC: 5.21 10*6/uL (ref 4.20–5.80)
RDW: 15.4 % — ABNORMAL HIGH (ref 11.0–15.0)
Total Lymphocyte: 21.3 %
WBC mixed population: 592 cells/uL (ref 200–950)
WBC: 6.8 10*3/uL (ref 3.8–10.8)

## 2017-02-08 NOTE — Progress Notes (Signed)
[address]       [place] [address]             [phone]     CARDIOTHORACIC SURGERY OFFICE NOTE  Referring Provider is Martinique, Peter M, MD PCP is [name], MD   HPI:  Patient is a 66 year old male with history of mitral valve prolapse with severe symptomatic primary mitral regurgitation, lung cancer, obstructive sleep apnea on CPAP, type 2 diabetes mellitus, and hyperlipidemia who returns to the office today for follow-up of mitral regurgitation. I initially had the opportunity to evaluate the patient in consultation on 08/21/2016.  Prior to that transthoracic and transesophageal echocardiograms revealed the presence of mitral valve prolapse with severe mitral regurgitation and normal left ventricular systolic function. Diagnostic cardiac catheterization revealed a long segment 65% stenosis of the left anterior descending coronary artery with borderline significant hemodynamic parameters on FFR analysis. At the same time the patient was: Incidentally noted to have a 2.6 cm mass in the right upper lobe consistent with primary lung cancer, clinical stage I.  The decision was made to manage the patient's cardiac disease medically over the short-term to facilitate expedient treatment of the newly discovered lung cancer. The patient underwent uncomplicated video assisted right upper lobectomy with mediastinal lymph node dissection by Dr. [name] on 09/02/2016. Final pathology was consistent with moderately differentiated adenocarcinoma with negative surgical margins and no evidence for visceral pleural invasion nor lymphovascular invasion. All lymph nodes were negative, consistent with pathologic stage IA (T1c N0 M0).  The patient was seen in consultation by Dr. Inda Merlin at the cancer center. Adjuvant chemotherapy was not recommended.  The patient has overall done fairly well, although he was readmitted to the hospital in early June with shortness of breath that was  felt to be primarily related to volume overload. CT angiogram at that time revealed what was felt to be a very small pulmonary embolus in the right lower lobe.  Xarelto was started at that time.  The patient was last seen by Dr. [name] on 11/17/2016, and more recently he was seen in follow-up by [name] on 01/13/2017. He has been actively participating in the pulmonary rehabilitation program. The patient states that initially he was doing quite well, but he seemed to reach a plateau and perhaps maybe even getting worse in recent weeks. He states that he gets short of breath with low to moderate level activity.  Some days are better than others. He denies resting shortness of breath. He has not had exertional chest pain or chest tightness. The soreness in his right lateral chest wall has almost completely resolved. He denies any productive cough. He has been taking Lasix on a daily basis since his hospitalization last June. He does not adjust his dose of Lasix based on symptoms or weight. He is not on an ACE inhibitor or beta blocker at this time. He denies PND or orthopnea.   Current Outpatient Prescriptions  Medication Sig Dispense Refill  . acetaminophen (TYLENOL) 325 MG tablet Take 650 mg by mouth every 6 (six) hours as needed for moderate pain or headache.    Marland Kitchen aspirin EC 81 MG tablet Take 1 tablet (81 mg total) by mouth daily. 90 tablet 3  . atorvastatin (LIPITOR) 20 MG tablet Take 1 tablet (20 mg total) by mouth daily. 90 tablet 3  . FLAXSEED, LINSEED, PO Take 20 mLs by mouth daily. Sprinkled in drink daily    . furosemide (LASIX) 40  MG tablet TAKE 1 TABLET BY MOUTH EVERY DAY 90 tablet 3  . Multiple Vitamin (MULTIVITAMIN) tablet Take 1 tablet by mouth daily.    . Omega-3 Fatty Acids (FISH OIL) 500 MG CAPS Take 500 mg by mouth daily.     . potassium chloride SA (K-DUR,KLOR-CON) 20 MEQ tablet Take 1 tablet (20 mEq total) by mouth daily. 30 tablet 1  . psyllium (METAMUCIL) 58.6 % packet Take  1 packet by mouth daily.    . rivaroxaban (XARELTO) 20 MG TABS tablet Take 1 tablet (20 mg total) by mouth daily with supper. 30 tablet 1  . Rivaroxaban 15 & 20 MG TBPK Take as directed on package: Start with one 15mg  tablet by mouth twice a day with food. On Day 22, switch to one 20mg  tablet once a day with food. 51 each 0  . sodium chloride (OCEAN) 0.65 % SOLN nasal spray Place 2 sprays into both nostrils at bedtime.    Bethann Humble Sulfate (ALLERGY RELIEF EYE DROPS OP) Apply 2 drops to eye daily as needed (allergy).    . traMADol (ULTRAM) 50 MG tablet Take 50 mg by mouth every 4-6 hours PRN severe pain. (Patient taking differently: Take 50 mg by mouth every 4 (four) hours as needed for moderate pain. ) 30 tablet 0  . Undecylenic Ac-Zn Undecylenate (FUNGI-NAIL TOE & FOOT EX) Apply 1 application topically daily.     No current facility-administered medications for this visit.       Physical Exam:   BP 139/86 (BP Location: Right Arm, Patient Position: Sitting, Cuff Size: Large)   Pulse 69   Resp 16   Ht 5\' 9"  (1.753 m)   Wt 225 lb 12.8 oz (102.4 kg)   SpO2 97% Comment: ON RA  BMI 33.34 kg/m   General:  Well-appearing  Chest:   Clear to auscultation with slightly diminished breath sounds on right side compared to left  CV:   Regular rate and rhythm with prominent holosystolic murmur  Incisions:  Well-healed  Abdomen:  Soft nontender  Extremities:  Warm and well-perfused with mild bilateral lower extremity edema  Diagnostic Tests:  n/a   Impression:  Patient has stage D severe symptomatic primary mitral regurgitation. He underwent uncomplicated right upper lobectomy a little over 5 months ago for early stage primary lung cancer. Under the circumstances he has done quite well, although he has frustrated by the fact that he has significant exertional shortness of breath and he has not gotten back to his baseline functional status prior to surgery. I suspect that this is at  least partially due to the presence of underlying severe mitral regurgitation, although his dyspnea is likely multifactorial and affected by decreased vital capacity and pre-existing obesity with obstructive sleep apnea.  It is possible that his breathing could be optimized further with adjustment to medical therapy for congestive heart failure. At some point he will need elective mitral valve repair. Left ventricular function has not been reassessed.    Plan:  We will request that a follow-up echocardiogram be performed to make sure that the patient's left ventricular systolic function remained stable. In addition, we will obtain pulmonary function tests to assess new baseline based upon recent right upper lobectomy. Routine blood work will be performed to make sure that the patient does not remain anemic. Finally, the patient will need to undergo follow-up CT angiogram which at this point has been scheduled for November. In the meanwhile I have suggested that the patient try doubling  his dose of Lasix for a few days and monitoring his weight carefully to see if his shortness of breath and lower extremity edema responded to diuretic therapy. Addition of an ACE inhibitor and/or beta blocker could be considered, but we will defer this to the patient's cardiologist. The patient will return to our office in 2-3 weeks to review the results of these tests and discuss treatment options and timing of intervention further.  I spent in excess of 45 minutes during the conduct of this office consultation and >50% of this time involved direct face-to-face encounter with the patient for counseling and/or coordination of their care.   Valentina Gu. Roxy Manns, MD 02/08/2017 9:41 AM

## 2017-02-08 NOTE — Patient Instructions (Addendum)
Try increasing your dose of lasix to 40 mg twice daily for 3 or 4 days, monitor your weight daily, and see if your breathing improves  Otherwise continue all previous medications without any changes at this time

## 2017-02-09 ENCOUNTER — Encounter (HOSPITAL_COMMUNITY): Payer: Medicare Other

## 2017-02-09 LAB — BRAIN NATRIURETIC PEPTIDE: Brain Natriuretic Peptide: 4 pg/mL (ref <100)

## 2017-02-15 ENCOUNTER — Other Ambulatory Visit: Payer: Self-pay

## 2017-02-15 ENCOUNTER — Ambulatory Visit (HOSPITAL_COMMUNITY): Payer: Medicare Other | Attending: Internal Medicine

## 2017-02-15 DIAGNOSIS — Z8249 Family history of ischemic heart disease and other diseases of the circulatory system: Secondary | ICD-10-CM | POA: Diagnosis not present

## 2017-02-15 DIAGNOSIS — Z902 Acquired absence of lung [part of]: Secondary | ICD-10-CM | POA: Insufficient documentation

## 2017-02-15 DIAGNOSIS — E785 Hyperlipidemia, unspecified: Secondary | ICD-10-CM | POA: Diagnosis not present

## 2017-02-15 DIAGNOSIS — C3491 Malignant neoplasm of unspecified part of right bronchus or lung: Secondary | ICD-10-CM | POA: Diagnosis not present

## 2017-02-15 DIAGNOSIS — G4733 Obstructive sleep apnea (adult) (pediatric): Secondary | ICD-10-CM | POA: Diagnosis not present

## 2017-02-15 DIAGNOSIS — Z6833 Body mass index (BMI) 33.0-33.9, adult: Secondary | ICD-10-CM | POA: Insufficient documentation

## 2017-02-15 DIAGNOSIS — I10 Essential (primary) hypertension: Secondary | ICD-10-CM | POA: Insufficient documentation

## 2017-02-15 DIAGNOSIS — I340 Nonrheumatic mitral (valve) insufficiency: Secondary | ICD-10-CM

## 2017-02-15 DIAGNOSIS — E669 Obesity, unspecified: Secondary | ICD-10-CM | POA: Insufficient documentation

## 2017-02-15 DIAGNOSIS — E119 Type 2 diabetes mellitus without complications: Secondary | ICD-10-CM | POA: Diagnosis not present

## 2017-02-15 DIAGNOSIS — I081 Rheumatic disorders of both mitral and tricuspid valves: Secondary | ICD-10-CM | POA: Diagnosis not present

## 2017-02-15 DIAGNOSIS — I2510 Atherosclerotic heart disease of native coronary artery without angina pectoris: Secondary | ICD-10-CM | POA: Diagnosis not present

## 2017-02-16 ENCOUNTER — Ambulatory Visit (HOSPITAL_COMMUNITY)
Admission: RE | Admit: 2017-02-16 | Discharge: 2017-02-16 | Disposition: A | Discharge status: Home / Self Care | Payer: Medicare Other | Source: Ambulatory Visit | Attending: Thoracic Surgery (Cardiothoracic Vascular Surgery) | Admitting: Thoracic Surgery (Cardiothoracic Vascular Surgery)

## 2017-02-16 ENCOUNTER — Telehealth: Payer: Self-pay

## 2017-02-16 DIAGNOSIS — I340 Nonrheumatic mitral (valve) insufficiency: Secondary | ICD-10-CM | POA: Diagnosis present

## 2017-02-16 DIAGNOSIS — J449 Chronic obstructive pulmonary disease, unspecified: Secondary | ICD-10-CM | POA: Insufficient documentation

## 2017-02-16 LAB — PULMONARY FUNCTION TEST
DL/VA % pred: 127 %
DL/VA: 5.81 ml/min/mmHg/L
DLCO unc % pred: 72 %
DLCO unc: 22.49 ml/min/mmHg
FEF 25-75 Post: 1.61 L/sec
FEF 25-75 Pre: 1.06 L/sec
FEF2575-%Change-Post: 51 %
FEF2575-%Pred-Post: 64 %
FEF2575-%Pred-Pre: 42 %
FEV1-%Change-Post: 11 %
FEV1-%Pred-Post: 54 %
FEV1-%Pred-Pre: 49 %
FEV1-Post: 1.76 L
FEV1-Pre: 1.58 L
FEV1FVC-%Change-Post: 7 %
FEV1FVC-%Pred-Pre: 97 %
FEV6-%Change-Post: 4 %
FEV6-%Pred-Post: 55 %
FEV6-%Pred-Pre: 53 %
FEV6-Post: 2.27 L
FEV6-Pre: 2.18 L
FEV6FVC-%Change-Post: 0 %
FEV6FVC-%Pred-Post: 106 %
FEV6FVC-%Pred-Pre: 105 %
FVC-%Change-Post: 4 %
FVC-%Pred-Post: 52 %
FVC-%Pred-Pre: 50 %
FVC-Post: 2.28 L
FVC-Pre: 2.19 L
Post FEV1/FVC ratio: 77 %
Post FEV6/FVC ratio: 100 %
Pre FEV1/FVC ratio: 72 %
Pre FEV6/FVC Ratio: 99 %
RV % pred: 160 %
RV: 3.78 L
TLC % pred: 89 %
TLC: 6.11 L

## 2017-02-16 MED ORDER — ALBUTEROL SULFATE (2.5 MG/3ML) 0.083% IN NEBU
2.5000 mg | INHALATION_SOLUTION | Freq: Once | RESPIRATORY_TRACT | Status: AC
  Administered 2017-02-16: 2.5 mg via RESPIRATORY_TRACT

## 2017-02-16 NOTE — Telephone Encounter (Signed)
Called patient left message on personal voice mail Dr.Jordan wanted to know how you are doing on increased Lasix dose.Advised to call me back to let us know.

## 2017-02-16 NOTE — Progress Notes (Signed)
Spoke to patient he stated he increased Lasix to 40 mg twice a day for 3 days last week.Stated he was unable to notice a difference.Stated he is still sob.Advised I will send message to [place] for advice.

## 2017-02-19 ENCOUNTER — Other Ambulatory Visit: Payer: Self-pay

## 2017-02-19 MED ORDER — LOSARTAN POTASSIUM 25 MG PO TABS: 25.0000 mg | ORAL_TABLET | Freq: Every day | ORAL | 6 refills | Status: DC

## 2017-02-19 NOTE — Progress Notes (Signed)
Spoke to patient's wife.Dr.Jordan advised to take Losartan 25 mg daily to see if helps sob.Appointment scheduled with Dr.Jordan 03/16/17 at 11:40 am.Advised to call sooner if needed.

## 2017-02-22 ENCOUNTER — Encounter: Payer: Self-pay | Admitting: Thoracic Surgery (Cardiothoracic Vascular Surgery)

## 2017-02-22 ENCOUNTER — Ambulatory Visit (INDEPENDENT_AMBULATORY_CARE_PROVIDER_SITE_OTHER): Payer: Medicare Other | Admitting: Thoracic Surgery (Cardiothoracic Vascular Surgery)

## 2017-02-22 ENCOUNTER — Encounter (HOSPITAL_COMMUNITY): Payer: Self-pay

## 2017-02-22 ENCOUNTER — Encounter: Payer: Self-pay | Admitting: Emergency Medicine

## 2017-02-22 VITALS — BP 139/85 | HR 78 | Ht 69.0 in | Wt 225.0 lb

## 2017-02-22 DIAGNOSIS — I340 Nonrheumatic mitral (valve) insufficiency: Secondary | ICD-10-CM | POA: Diagnosis not present

## 2017-02-22 DIAGNOSIS — C3491 Malignant neoplasm of unspecified part of right bronchus or lung: Secondary | ICD-10-CM

## 2017-02-22 NOTE — Progress Notes (Signed)
Discharge Progress Report  Patient Details  Name: James [name] MRN: [id number] Date of Birth: [birth year] Referring Provider:     Pulmonary Rehab Walk Test from 10/29/2016 in [place]  Referring Provider  Dr. [name]       Number of Visits: 23  Reason for Discharge:  Patient reached a stable level of exercise. Patient independent in their exercise. Patient has met program and personal goals.  Smoking History:  History  Smoking Status  . Never Smoker  Smokeless Tobacco  . Never Used    Diagnosis:  Adenocarcinoma of right lung ([place])  ADL UCSD:     Pulmonary Assessment Scores    Row Name 02/02/17 1535 02/03/17 1101       ADL UCSD   ADL Phase Exit Exit    SOB Score total  - 23      CAT Score   CAT Score  - 8  Exit      mMRC Score   mMRC Score 1  -       Initial Exercise Prescription:   Discharge Exercise Prescription (Final Exercise Prescription Changes):     Exercise Prescription Changes - 01/28/17 1622      Response to Exercise   Blood Pressure (Admit) 138/64   Blood Pressure (Exercise) 154/76   Blood Pressure (Exit) 120/70   Heart Rate (Admit) 89 bpm   Heart Rate (Exercise) 134 bpm   Heart Rate (Exit) 91 bpm   Oxygen Saturation (Admit) 97 %   Oxygen Saturation (Exercise) 91 %   Oxygen Saturation (Exit) 94 %   Rating of Perceived Exertion (Exercise) 13   Perceived Dyspnea (Exercise) 2   Duration Continue with 45 min of aerobic exercise without signs/symptoms of physical distress.   Intensity THRR unchanged     Progression   Progression Continue to progress workloads to maintain intensity without signs/symptoms of physical distress.     Resistance Training   Training Prescription Yes   Weight blue bands   Reps 10-15   Time 10 Minutes     Interval Training   Interval Training No     NuStep   Level 7   Minutes 17   METs 4.1     Track   Laps 21   Minutes 17      Functional Capacity:     6  Minute Walk    Row Name 02/02/17 1531         6 Minute Walk   Phase Discharge     Distance 1700 feet     Distance Feet Change 153 ft     Walk Time 6 minutes     # of Rest Breaks 0     MPH 3.21     METS 3.45     RPE 14     Perceived Dyspnea  4     Symptoms No     Resting HR 91 bpm     Resting BP 134/72     Resting Oxygen Saturation  98 %     Exercise Oxygen Saturation  during 6 min walk 0 %     Max Ex. HR 153 bpm     Max Ex. BP 180/82     2 Minute Post BP 144/70       Interval HR   1 Minute HR 131     2 Minute HR 132     3 Minute HR 139     4 Minute HR 133  5 Minute HR 153     6 Minute HR 153     2 Minute Post HR 101     Interval Heart Rate? Yes       Interval Oxygen   Interval Oxygen? Yes     Baseline Oxygen Saturation % 98 %     1 Minute Oxygen Saturation % 97 %     1 Minute Liters of Oxygen 0 L     2 Minute Oxygen Saturation % 94 %     2 Minute Liters of Oxygen 0 L     3 Minute Oxygen Saturation % 93 %     3 Minute Liters of Oxygen 0 L     4 Minute Oxygen Saturation % 91 %     4 Minute Liters of Oxygen 0 L     5 Minute Oxygen Saturation % 90 %     5 Minute Liters of Oxygen 0 L     6 Minute Oxygen Saturation % 90 %     6 Minute Liters of Oxygen 0 L     2 Minute Post Oxygen Saturation % 98 %     2 Minute Post Liters of Oxygen 0 L        Psychological, QOL, Others - Outcomes: PHQ 2/9: Depression screen [facility] 2/9 02/02/2017 10/19/2016 07/29/2016 07/27/2016 07/28/2015  Decreased Interest 0 1 0 0 0  Down, Depressed, Hopeless 0 3 0 0 0  PHQ - 2 Score 0 4 0 0 0  Altered sleeping - 3 - - -  Tired, decreased energy - 3 - - -  Change in appetite - 3 - - -  Feeling bad or failure about yourself  - 0 - - -  Trouble concentrating - 0 - - -  Moving slowly or fidgety/restless - 0 - - -  Suicidal thoughts - 0 - - -  PHQ-9 Score - 13 - - -  Difficult doing work/chores - Not difficult at all - - -    Quality of Life:   Personal Goals: Goals established at  orientation with interventions provided to work toward goal.    Personal Goals Discharge:     Goals and Risk Factor Review    Row Name 01/06/17 2129 01/29/17 1326 02/22/17 0854         Core Components/Risk Factors/Patient Goals Review   Personal Goals Review Weight Management/Obesity;Improve shortness of breath with ADL's;Increase knowledge of respiratory medications and ability to use respiratory devices properly.;Develop more efficient breathing techniques such as purse lipped breathing and diaphragmatic breathing and practicing self-pacing with activity. Weight Management/Obesity;Improve shortness of breath with ADL's;Increase knowledge of respiratory medications and ability to use respiratory devices properly.;Develop more efficient breathing techniques such as purse lipped breathing and diaphragmatic breathing and practicing self-pacing with activity.  -     Review patient has progressed well over the past 30 days in pulmonary rehab. he has tolerated workload increases although is limited by his shortness of breath. This may not be related to any pulmonary illness but to his need for a valve replacement. he is utilizing PLB when appropriate. He is beginning to ask question regarding the effects of a round abdomen on breathing and has realized that if he can loose some weight his breathing may improve somewhat.  patient continues to progress well in pulmonary rehab. He is able to walk 21 laps in 15 min which is almost a mile. His shortness of breath is not improving and may be getting worse  with exertions however this is valve related and unfortunately will not improve with rehab. his cardiologist gave him clearence to continue to push himself at rehab. patient met all pulmonary rehab goals at discharge except shortness of breath. His shortness of breath is assumend to be related to a heart valve and not a lung problem.      Expected Outcomes expect to see more progression of goals in the next 30  days expect to see more progression of goals in the next 30 days  -        Exercise Goals and Review:   Nutrition & Weight - Outcomes:      Post Biometrics - 02/02/17 1535       Post  Biometrics   Grip Strength 30 kg      Nutrition:   Nutrition Discharge:     Nutrition Assessments - 02/05/17 1112      Rate Your Plate Scores   Pre Score 51   Post Score 59      Education Questionnaire Score:     Knowledge Questionnaire Score - 02/03/17 1059      Knowledge Questionnaire Score   Post Score 12/13      Goals reviewed with patient; copy given to patient.

## 2017-02-22 NOTE — Patient Instructions (Signed)
Stop taking Xarelto on Wednesday November 7  Continue taking all other medications without change through the day before surgery.  Have nothing to eat or drink after midnight the night before surgery.  On the morning of surgery do not take any of your regular medications

## 2017-02-22 NOTE — Progress Notes (Signed)
[name]       [place] [address]             [phone]     CARDIOTHORACIC SURGERY OFFICE NOTE  Referring Provider is Martinique, Peter M, MD PCP is [name], MD   HPI:  Patient returns to the office today for follow-up of severe symptomatic primary mitral regurgitation. He was last seen in our office on 02/08/2017. Since then he underwent follow-up echocardiogram and pulmonary function testing. He reports no new problems or complaints over the last few weeks.   Current Outpatient Prescriptions  Medication Sig Dispense Refill  . acetaminophen (TYLENOL) 325 MG tablet Take 650 mg by mouth every 6 (six) hours as needed for moderate pain or headache.    Marland Kitchen aspirin EC 81 MG tablet Take 1 tablet (81 mg total) by mouth daily. 90 tablet 3  . atorvastatin (LIPITOR) 20 MG tablet Take 1 tablet (20 mg total) by mouth daily. 90 tablet 3  . FLAXSEED, LINSEED, PO Take 20 mLs by mouth daily. Sprinkled in drink daily    . furosemide (LASIX) 40 MG tablet TAKE 1 TABLET BY MOUTH EVERY DAY 90 tablet 3  . losartan (COZAAR) 25 MG tablet Take 1 tablet (25 mg total) by mouth daily. 30 tablet 6  . Multiple Vitamin (MULTIVITAMIN) tablet Take 1 tablet by mouth daily.    . Omega-3 Fatty Acids (FISH OIL) 500 MG CAPS Take 500 mg by mouth daily.     . potassium chloride SA (K-DUR,KLOR-CON) 20 MEQ tablet Take 1 tablet (20 mEq total) by mouth daily. 30 tablet 1  . psyllium (METAMUCIL) 58.6 % packet Take 1 packet by mouth daily.    . rivaroxaban (XARELTO) 20 MG TABS tablet Take 1 tablet (20 mg total) by mouth daily with supper. 30 tablet 1  . Rivaroxaban 15 & 20 MG TBPK Take as directed on package: Start with one 15mg  tablet by mouth twice a day with food. On Day 22, switch to one 20mg  tablet once a day with food. 51 each 0  . sodium chloride (OCEAN) 0.65 % SOLN nasal spray Place 2 sprays into both nostrils at bedtime.    Bethann Humble Sulfate (ALLERGY RELIEF EYE DROPS OP) Apply  2 drops to eye daily as needed (allergy).    . traMADol (ULTRAM) 50 MG tablet Take 50 mg by mouth every 4-6 hours PRN severe pain. (Patient taking differently: Take 50 mg by mouth every 4 (four) hours as needed for moderate pain. ) 30 tablet 0  . Undecylenic Ac-Zn Undecylenate (FUNGI-NAIL TOE & FOOT EX) Apply 1 application topically daily.     No current facility-administered medications for this visit.       Physical Exam:   BP 139/85   Pulse 78   Ht 5\' 9"  (1.753 m)   Wt 225 lb (102.1 kg)   SpO2 97%   BMI 33.23 kg/m   General:  Obese but well appearing  Chest:   Clear to auscultation with diminished breath sounds on the right side  CV:   Regular rate and rhythm with holosystolic murmur  Incisions:  Completely healed  Abdomen:  Soft nontender  Extremities:  Warm and well-perfused  Diagnostic Tests:  Transthoracic Echocardiography  Patient:    James [name], James [name] MR #:       [id number] Study Date: 02/15/2017 Gender:     M Age:        41 Height:  175.3 cm Weight:     102.4 kg BSA:        2.27 m^2 Pt. Status: Room:   ATTENDING    Dorris Carnes, M.D.  ORDERING     Darylene Price, M.D.  REFERRING    Darylene Price, M.D.  PERFORMING   [facility], Outpatient  SONOGRAPHER  [facility], RDCS  cc:  ------------------------------------------------------------------- LV EF: 60% -   65%  ------------------------------------------------------------------- Indications:      Mitral Regurgitation (I34.0).  ------------------------------------------------------------------- History:   PMH:  TEE Results:  Mitral valve: There is a partially flail P3 segment due to ruptured cord, seen prolapsing in both 2D and 3D images. There is severe, posterolaterally directed mitral regurgitation which swirls into the left atrial appendage. Right Lung Cancer status post Lobectomy (April, 2018), Obstructive Sleep Apnea, Obesity. Dyspnea and murmur.  Coronary artery disease.  Risk  factors: Family history of coronary artery disease. Hypertension. Diabetes mellitus. Dyslipidemia.  ------------------------------------------------------------------- Study Conclusions  - Left ventricle: The cavity size was normal. Wall thickness was   normal. Systolic function was normal. The estimated ejection   fraction was in the range of 60% to 65%. - Aortic valve: AV is thickened, calcified with minimally   restricted motion. - Mitral valve: Prolapse fo the posterior mitral leaflet. MR is at   least moderate in intensity Calcified annulus. Mildly thickened   leaflets . - Left atrium: The atrium was mildly dilated.  ------------------------------------------------------------------- Study data:  Comparison was made to the study of 07/17/2016.  Study status:  Routine.  Procedure:  Transthoracic echocardiography. Image quality was adequate.          Transthoracic echocardiography.  M-mode, complete 2D, 3D, spectral Doppler, and color Doppler.  Birthdate:  Patient birthdate: [birth year].  Age: Patient is 66 yr old.  Sex:  Gender: male.    BMI: 33.3 kg/m^2. Blood pressure:     139/86  Patient status:  Outpatient.  Study date:  Study date: 02/15/2017. Study time: 08:34 AM.  Location: [facility]  -------------------------------------------------------------------  ------------------------------------------------------------------- Left ventricle:  The cavity size was normal. Wall thickness was normal. Systolic function was normal. The estimated ejection fraction was in the range of 60% to 65%.  ------------------------------------------------------------------- Aortic valve:  AV is thickened, calcified with minimally restricted motion.  Mildly thickened, mildly calcified leaflets.  Doppler: There was no regurgitation.    VTI ratio of LVOT to aortic valve: 0.45. Valve area (VTI): 1.41 cm^2. Indexed valve area (VTI): 0.62 cm^2/m^2. Peak velocity ratio of LVOT to  aortic valve: 0.41. Valve area (Vmax): 1.29 cm^2. Indexed valve area (Vmax): 0.57 cm^2/m^2. Mean velocity ratio of LVOT to aortic valve: 0.42. Valve area (Vmean): 1.32 cm^2. Indexed valve area (Vmean): 0.58 cm^2/m^2. Mean gradient (S): 8 mm Hg. Peak gradient (S): 14 mm Hg.  ------------------------------------------------------------------- Mitral valve:  Prolapse fo the posterior mitral leaflet. MR is at least moderate in intensity  Calcified annulus. Mildly thickened leaflets .  Doppler:     Peak gradient (D): 5 mm Hg.  ------------------------------------------------------------------- Left atrium:  The atrium was mildly dilated.  ------------------------------------------------------------------- Right ventricle:  The cavity size was normal. Wall thickness was normal. Systolic function was normal.  ------------------------------------------------------------------- Pulmonic valve:    Structurally normal valve.   Cusp separation was normal.  Doppler:  Transvalvular velocity was within the normal range. There was no regurgitation.  ------------------------------------------------------------------- Tricuspid valve:   Structurally normal valve.   Leaflet separation was normal.  Doppler:  Transvalvular velocity was within the normal range. There was mild  regurgitation.  ------------------------------------------------------------------- Right atrium:  The atrium was normal in size.  ------------------------------------------------------------------- Pericardium:  There was no pericardial effusion.  ------------------------------------------------------------------- Systemic veins: Inferior vena cava: The vessel was dilated. The respirophasic diameter changes were blunted (< 50%), consistent with elevated central venous pressure.  ------------------------------------------------------------------- Measurements   Left ventricle                           Value           Reference  LV ID, ED, PLAX chordal          (L)     41.5  mm       43 - 52  LV ID, ES, PLAX chordal                  29.6  mm       23 - 38  LV fx shortening, PLAX chordal           29    %        >=29  LV PW thickness, ED                      13.6  mm       ----------  IVS/LV PW ratio, ED                      0.82           <=1.3  Stroke volume, 2D                        58    ml       ----------  Stroke volume/bsa, 2D                    26    ml/m^2   ----------  LV e&', lateral                           16.9  cm/s     ----------  LV E/e&', lateral                         6.51           ----------  LV e&', medial                            9.46  cm/s     ----------  LV E/e&', medial                          11.63          ----------  LV e&', average                           13.18 cm/s     ----------  LV E/e&', average                         8.35           ----------    Ventricular septum                       Value  Reference  IVS thickness, ED                        11.2  mm       ----------    LVOT                                     Value          Reference  LVOT ID, S                               20    mm       ----------  LVOT area                                3.14  cm^2     ----------  LVOT peak velocity, S                    75.5  cm/s     ----------  LVOT mean velocity, S                    52.9  cm/s     ----------  LVOT VTI, S                              18.4  cm       ----------    Aortic valve                             Value          Reference  Aortic valve peak velocity, S            184   cm/s     ----------  Aortic valve mean velocity, S            126   cm/s     ----------  Aortic valve VTI, S                      41    cm       ----------  Aortic mean gradient, S                  8     mm Hg    ----------  Aortic peak gradient, S                  14    mm Hg    ----------  VTI ratio, LVOT/AV                       0.45           ----------  Aortic  valve area, VTI                   1.41  cm^2     ----------  Aortic valve area/bsa, VTI               0.62  cm^2/m^2 ----------  Velocity ratio, peak, LVOT/AV            0.41           ----------  Aortic valve area, peak velocity         1.29  cm^2     ----------  Aortic valve area/bsa, peak              0.57  cm^2/m^2 ----------  velocity  Velocity ratio, mean, LVOT/AV            0.42           ----------  Aortic valve area, mean velocity         1.32  cm^2     ----------  Aortic valve area/bsa, mean              0.58  cm^2/m^2 ----------  velocity    Aorta                                    Value          Reference  Aortic root ID, ED                       32    mm       ----------  Ascending aorta ID, A-P, S               35    mm       ----------    Left atrium                              Value          Reference  LA ID, A-P, ES                           53    mm       ----------  LA ID/bsa, A-P                   (H)     2.34  cm/m^2   <=2.2  LA volume, S                             59.9  ml       ----------  LA volume/bsa, S                         26.4  ml/m^2   ----------  LA volume, ES, 1-p A4C                   80.4  ml       ----------  LA volume/bsa, ES, 1-p A4C               35.5  ml/m^2   ----------  LA volume, ES, 1-p A2C                   41.7  ml       ----------  LA volume/bsa, ES, 1-p A2C               18.4  ml/m^2   ----------    Mitral valve                             Value  Reference  Mitral E-wave peak velocity              110   cm/s     ----------  Mitral A-wave peak velocity              56.9  cm/s     ----------  Mitral deceleration time         (H)     306   ms       150 - 230  Mitral peak gradient, D                  5     mm Hg    ----------  Mitral E/A ratio, peak                   1.9            ----------    Tricuspid valve                          Value          Reference  Tricuspid regurg peak velocity           316   cm/s     ----------   Tricuspid peak RV-RA gradient            40    mm Hg    ----------    Right atrium                             Value          Reference  RA ID, S-I, ES, A4C              (H)     51.7  mm       34 - 49  RA area, ES, A4C                         14.7  cm^2     8.3 - 19.5  RA volume, ES, A/L                       34.2  ml       ----------  RA volume/bsa, ES, A/L                   15.1  ml/m^2   ----------    Right ventricle                          Value          Reference  TAPSE                                    17.8  mm       ----------  RV s&', lateral, S                        13.8  cm/s     ----------  Legend: (L)  and  (H)  mark values outside specified reference range.  ------------------------------------------------------------------- Prepared and Electronically Authenticated by  Dorris Carnes, M.D. 2018-10-08T16:40:09     Pulmonary Function Tests  Baseline      Post-bronchodilator  FVC  2.19 L  (  50% predicted) FVC  2.28 L  (52% predicted) FEV1  1.58 L  (49% predicted) FEV1  1.76 L  (54% predicted) FEF25-75 1.06 L  (42% predicted) FEF25-75 1.61 L  (64% predicted)  TLC  6.11 L  (89% predicted) RV  3.78 L  (160% predicted) DLCO  72% predicted    Impression:  Patient has stage D severe symptomatic primary mitral regurgitation. He underwent uncomplicated right upper lobectomy a little over 5 months ago for early stage primary lung cancer. Under the circumstances he has done quite well, although he has frustrated by the fact that he has significant exertional shortness of breath and he has not gotten back to his baseline functional status prior to surgery. I suspect that this is at least partially due to the presence of underlying severe mitral regurgitation, although his dyspnea is likely multifactorial and affected by decreased vital capacity and pre-existing obesity with obstructive sleep apnea.  Follow-up echocardiogram reveals normal left ventricular systolic function.  Pulmonary function tests are notable for a significant drop in the patient's forced vital capacity down from 3.8 L to 2.2 L after lobectomy.    Plan:  I have again reviewed the indications, risks, and potential benefits of mitral valve repair and coronary artery bypass grafting with the patient and his wife. Timing of surgery has been discussed at length.  He is eager to proceed with surgery in the near future. We will obtain routine follow-up CT angiography of the chest as previously planned by Dr. Inda Merlin. We tented we plan to proceed with surgery on 03/24/2017. The patient has been instructed to stop taking Xarelto 1 week prior to surgery. He will return to our office for routine follow-up on 03/22/2017 prior to surgery.  I spent in excess of 15 minutes during the conduct of this office consultation and >50% of this time involved direct face-to-face encounter with the patient for counseling and/or coordination of their care.    Valentina Gu. Roxy Manns, MD 02/22/2017 4:42 PM

## 2017-02-23 ENCOUNTER — Other Ambulatory Visit: Payer: Self-pay

## 2017-02-23 ENCOUNTER — Ambulatory Visit: Payer: Medicare Other | Admitting: Thoracic Surgery (Cardiothoracic Vascular Surgery)

## 2017-02-23 ENCOUNTER — Encounter: Payer: Self-pay | Admitting: *Deleted

## 2017-02-23 ENCOUNTER — Telehealth: Payer: Self-pay | Admitting: *Deleted

## 2017-02-23 ENCOUNTER — Other Ambulatory Visit: Payer: Self-pay | Admitting: *Deleted

## 2017-02-23 ENCOUNTER — Telehealth: Payer: Self-pay | Admitting: Emergency Medicine

## 2017-02-23 DIAGNOSIS — Z85118 Personal history of other malignant neoplasm of bronchus and lung: Secondary | ICD-10-CM

## 2017-02-23 DIAGNOSIS — I2510 Atherosclerotic heart disease of native coronary artery without angina pectoris: Secondary | ICD-10-CM

## 2017-02-23 DIAGNOSIS — I340 Nonrheumatic mitral (valve) insufficiency: Secondary | ICD-10-CM

## 2017-02-23 DIAGNOSIS — R0602 Shortness of breath: Secondary | ICD-10-CM

## 2017-02-23 DIAGNOSIS — Z01818 Encounter for other preprocedural examination: Secondary | ICD-10-CM

## 2017-02-23 MED ORDER — LOSARTAN POTASSIUM 25 MG PO TABS: 25.0000 mg | ORAL_TABLET | Freq: Every day | ORAL | 6 refills | Status: DC

## 2017-02-23 NOTE — Telephone Encounter (Signed)
Called and spoke with pt and he is aware of results.  Nothing further is needed.

## 2017-02-23 NOTE — Telephone Encounter (Signed)
Let him know that I also reviewed his recent PFT from earlier this month. These show mixed restriction of the size of his breaths and obstruction of his airflow with each breath. There is nothing there to preclude proceeding with his planned surgery but we should get together and discuss results, talk about any possible medications as we go forward.

## 2017-02-23 NOTE — Telephone Encounter (Signed)
Patient called and stated that the pharmacy did not receive the rx for losartan last week as they did not have power. When their power was restored yesterday they were unable to pull the rx up on the patients profile.

## 2017-02-23 NOTE — Telephone Encounter (Signed)
Please let him know that spirometry results unfortunately do not flow into MyChart. The results are as below  08/2016: FVC 3.86 L (88% predicted), FEV1 2.91 L (91% predicted), ratio 75%, normal flow volume curve. These are essentially normal. Recall they were done before his nodule resection

## 2017-02-23 NOTE — Telephone Encounter (Signed)
Received the following message from the patient via MyChart:   Message   Please post the results of my breathing test from 08/10/2016. Thank you   Patient had a spirometry performed while in the office. Per his OV note, it states that the spirometry was normal.    RB, please advise before I send the result to the patient. Thanks!

## 2017-02-24 ENCOUNTER — Ambulatory Visit (INDEPENDENT_AMBULATORY_CARE_PROVIDER_SITE_OTHER): Payer: Medicare Other | Admitting: Emergency Medicine

## 2017-02-24 ENCOUNTER — Encounter: Payer: Self-pay | Admitting: Emergency Medicine

## 2017-02-24 DIAGNOSIS — R0609 Other forms of dyspnea: Secondary | ICD-10-CM | POA: Diagnosis not present

## 2017-02-24 DIAGNOSIS — G4733 Obstructive sleep apnea (adult) (pediatric): Secondary | ICD-10-CM | POA: Diagnosis not present

## 2017-02-24 DIAGNOSIS — C3491 Malignant neoplasm of unspecified part of right bronchus or lung: Secondary | ICD-10-CM

## 2017-02-24 DIAGNOSIS — I2699 Other pulmonary embolism without acute cor pulmonale: Secondary | ICD-10-CM

## 2017-02-24 DIAGNOSIS — Z9989 Dependence on other enabling machines and devices: Secondary | ICD-10-CM

## 2017-02-24 MED ORDER — BUDESONIDE-FORMOTEROL FUMARATE 80-4.5 MCG/ACT IN AERO: 2.0000 | INHALATION_SPRAY | Freq: Two times a day (BID) | RESPIRATORY_TRACT | 0 refills | Status: DC

## 2017-02-24 NOTE — Patient Instructions (Signed)
Your pulmonary function testing shows some evidence for abnormal airflow that was not evident in April. This could be related to some abnormality in your upper airway post-surgery, or possibly a new observation of asthma.  We will start Symbicort 80 mcg, 2 puffs twice day to see if this benefits your breathing. Please rinse and gargle after using.  Call our office in 2 weeks to let us know how your breathing is doing.  Depending on your status, we may decide to perform an airway inspection to insure no upper airway abnormalities.  Follow with Dr [name] in 3 weeks or sooner if you have any problems.

## 2017-02-24 NOTE — Assessment & Plan Note (Signed)
Currently on Xarelto

## 2017-02-24 NOTE — Assessment & Plan Note (Signed)
Post right upper lobe lobectomy. Consistent with stage I disease, on observation and following with Dr. Earlie Server with oncology.

## 2017-02-24 NOTE — Progress Notes (Signed)
Subjective:    Patient ID: James [name], male    DOB: [birth year], 66 y.o.   MRN: [id number]  HPI  66 year old never smoker with a history of a positive PPD as a child, then was non reactive in 2012. Seen by me in 09/2012 for a RUL nodule. At the time it was an irregular groundglass nodule. It had been followed in our office since 2011 and was slowly enlarging. We arranged for navigational bronchoscopy that was non-diagnostic. Since that time I have not seen him, he has been lost to follow-up. Repeat CT scan of his chest ordered by Dr Ronnald Ramp 08/05/16 after he had a viral syndrome has been personally reviewed by me. The right upper lobe nodule is now an irregular, solid 2.5 cm lesion without any GG component. Also noted was some evidence for possible chronic pulmonary embolism in a left lower lobe segmental pulmonary arterial branch.  He has been scheduled to undergo left and right cardiac catheterization for a mitral valve flail leaflet. He is asymptomatic - no cough, dyspnea. He occasionally has mid R chest pain.   ROV 08/20/16 -- follow up visit for evaluation of an enlarging RUL nodule. He also is being followed by Dr Martinique for flail MV leaflet and possible repair. He underwent L and R heart cath as below. Showed CAD, normal CO, normal R and L heart filling pressures. I performed a PET scan 4/9 and have personally reviewed. Shows that the RUL nodule is hypermetabolic, no hilar or mediastinal LAD seen. No distant disease.   ROV 02/24/17 -- This follow-up visit for patient with a history of right upper lobe groundglass nodule, underwent surgical resection on 09/02/16. The pathological diagnosis was consistent with adenocarcinoma of the lung. He did not require any chemo. To be followed with serial scans. Course c/b PE in 10/2016, now on Xarelto. He has had some persistent dyspnea, has remained limited.  He describes exertional SOB with carrying heavy object. He has had a hoarse voice since sgy. He may have some  wheeze with exertion. He is reliable with his CPAP.   He underwent pulmonary function testing 02/16/17 that showed evidence for mixed restriction and obstruction with a normal FEV1 to FVC ratio, FEV1 severely reduced at 49% predicted, hyperinflated lung volumes based on RV, mildly reduced diffusion capacity that corrected to the normal range when adjusted for his alveolar volume. His F/v loop looked normal on the pre-BD curve, showed a possible fixed insp truncation.    Review of Systems  Constitutional: Negative for fever and unexpected weight change.  HENT: Negative for congestion, dental problem, ear pain, nosebleeds, postnasal drip, rhinorrhea, sinus pressure, sneezing, sore throat and trouble swallowing.   Eyes: Negative for redness and itching.  Respiratory: Negative for cough, chest tightness, shortness of breath and wheezing.   Cardiovascular: Negative for palpitations and leg swelling.  Gastrointestinal: Negative for nausea and vomiting.  Genitourinary: Negative for dysuria.  Musculoskeletal: Negative for joint swelling.  Skin: Negative for rash.  Neurological: Negative for headaches.  Hematological: Does not bruise/bleed easily.  Psychiatric/Behavioral: Negative for dysphoric mood. The patient is not nervous/anxious.     Past Medical History:  Diagnosis Date  . Adenocarcinoma of right lung, stage 1 ([place]) 09/06/2016  . Anxiety   . Arthritis    "knees, hips, back" (10/19/2012)  . Chronic lower back pain   . Colon polyps    10/27/2002, repeat letter 09/17/2007  . Coronary artery disease   . Depressive disorder, not elsewhere classified   .  Diabetes mellitus without complication (HCC)    diet controlled  . Fasting hyperglycemia   . GERD (gastroesophageal reflux disease)   . Heart murmur   . Hemoptysis    abnormal CT Chest 01/29/10 - ? new GG changes RUL > not viz on plain cxr 02/26/2010  . Mitral regurgitation    severe MR 08/2016  . MPN (myeloproliferative neoplasm) ([place])     1st detected 06/04/1998  . Obesity   . OSA on CPAP    last sleep study 10 years ago  . Other and unspecified hyperlipidemia   . Positive PPD 1965   "non reactive in 2012" (10/19/2012)  . Routine general medical examination at a health care facility   . Special screening for malignant neoplasm of prostate   . Spinal stenosis, unspecified region other than cervical   . Wrist pain, left      Family History  Problem Relation Age of Onset  . Heart failure Mother   . Heart disease Father   . Heart attack Father   . Heart disease Paternal Uncle   . Prostate cancer Neg Hx   . Colon cancer Neg Hx   . Hypertension Neg Hx   . Hyperlipidemia Neg Hx   . Diabetes Neg Hx      Social History   Social History  . Marital status: Married    Spouse name: N/A  . Number of children: 3  . Years of education: N/A   Occupational History  . Retired, Financial risk analyst   . Retired, Real estate     Slow   Social History Main Topics  . Smoking status: Never Smoker  . Smokeless tobacco: Never Used  . Alcohol use 0.6 oz/week    1 Cans of beer per week  . Drug use: No  . Sexual activity: No   Other Topics Concern  . Not on file   Social History Narrative   HSG, [employer]. Married '73.  2 sons - '74,   '76;  1 daughter -  '77  6 grandchildren.   Work - IT consultant now retired. ACP - not fully discussed.                     Allergies  Allergen Reactions  . Amoxicillin Itching and Other (See Comments)    Has patient had a PCN reaction causing immediate rash, facial/tongue/throat swelling, SOB or lightheadedness with hypotension: No Has patient had a PCN reaction causing severe rash involving mucus membranes or skin necrosis: No Has patient had a PCN reaction that required hospitalization No Has patient had a PCN reaction occurring within the last 10 years: No If all of the above answers are "NO", then may proceed with Cephalosporin use.      Outpatient  Medications Prior to Visit  Medication Sig Dispense Refill  . acetaminophen (TYLENOL) 325 MG tablet Take 650 mg by mouth every 6 (six) hours as needed for moderate pain or headache.    Marland Kitchen aspirin EC 81 MG tablet Take 1 tablet (81 mg total) by mouth daily. 90 tablet 3  . atorvastatin (LIPITOR) 20 MG tablet Take 1 tablet (20 mg total) by mouth daily. 90 tablet 3  . FLAXSEED, LINSEED, PO Take 20 mLs by mouth daily. Sprinkled in drink daily    . furosemide (LASIX) 40 MG tablet TAKE 1 TABLET BY MOUTH EVERY DAY 90 tablet 3  . losartan (COZAAR) 25 MG tablet Take 1 tablet (25 mg total) by  mouth daily. 30 tablet 6  . Multiple Vitamin (MULTIVITAMIN) tablet Take 1 tablet by mouth daily.    . Omega-3 Fatty Acids (FISH OIL) 500 MG CAPS Take 500 mg by mouth daily.     . potassium chloride SA (K-DUR,KLOR-CON) 20 MEQ tablet Take 1 tablet (20 mEq total) by mouth daily. 30 tablet 1  . psyllium (METAMUCIL) 58.6 % packet Take 1 packet by mouth daily.    . rivaroxaban (XARELTO) 20 MG TABS tablet Take 1 tablet (20 mg total) by mouth daily with supper. 30 tablet 1  . sodium chloride (OCEAN) 0.65 % SOLN nasal spray Place 2 sprays into both nostrils at bedtime.    Bethann Humble Sulfate (ALLERGY RELIEF EYE DROPS OP) Apply 2 drops to eye daily as needed (allergy).    . traMADol (ULTRAM) 50 MG tablet Take 50 mg by mouth every 4-6 hours PRN severe pain. (Patient taking differently: Take 50 mg by mouth every 4 (four) hours as needed for moderate pain. ) 30 tablet 0  . Undecylenic Ac-Zn Undecylenate (FUNGI-NAIL TOE & FOOT EX) Apply 1 application topically daily.    . Rivaroxaban 15 & 20 MG TBPK Take as directed on package: Start with one 15mg  tablet by mouth twice a day with food. On Day 22, switch to one 20mg  tablet once a day with food. 51 each 0   No facility-administered medications prior to visit.         Objective:   Physical Exam Vitals:   02/24/17 1147  BP: 128/76  Pulse: 76  SpO2: 97%  Weight: 224 lb  (101.6 kg)  Height: 5\' 9"  (1.753 m)   Gen: Pleasant overweight man, somewhat anxious, in no distress  ENT:no oral lesions  Neck: No JVD, no stridor  Lungs: No accessory muscle use, clear B   Cardiovascular: regular, 2/6 systolic M  Musculoskeletal: no clubbing, no cyanosis  Neuro: awake, alert, non-focal.   Skin: no rash.       Assessment & Plan:  OSA on CPAP Continue CPAP as ordered  Adenocarcinoma of right lung, stage 1 ([place]) Post right upper lobe lobectomy. Consistent with stage I disease, on observation and following with Dr. Earlie Server with oncology.  Pulmonary embolus (HCC) Currently on Xarelto  Dyspnea on exertion He notes that he has had decreased functional capacity postop from his right upper lobe lobectomy. He was initially improving as one might predict but then expressed, location of pulmonary embolism. He continues to be limited and is concerned about this. He has hoarse voice, sometimes hears wheezing. Ordinarily I would ascribe his dyspnea to the predictable recovery period. Surprisingly however his pulmonary function testing from last week show combined restriction and obstruction. This is inconsistent with his spirometry done in April prior to his surgery. Unclear to me why he should develop lower airways obstruction/asthma following a right upper lobe lobectomy, or pulmonary embolism. Certainly full PFT is more sensitive test than office spirometry. In addition, his flow-volume loop is consistent with a traditional obstructive pattern but also has truncation of his inspiratory loop on some of his trials. Raises the possibility of an intermittent upper airway obstruction, possibly related to intubation for his surgery. I spent 45 minutes counseling him and his wife, reviewing pulmonary function testing, reviewing the flow volume loops with him. We discussed symptoms he is experiencing, the potential relationship between these and asthma versus upper airway process. He  also had a lot of questions regarding the potential impact of a future valve surgery on his  breathing. I tried to answer these given the information that we have available at this time.   At this time I would like to try to further evaluate the potential for obstructive lung disease by starting him empiric clear on a scheduled bronchodilator. If he benefits then this would be consistent with an asthma-type picture. Depending on how he responds then we may decide to perform bronchoscopy with an upper airway inspection to determine whether there is any evidence for vocal cord dysfunction, paralysis, injury or possibly subglottic stenosis.   I don't believe that either of these issues should preclude him from having his valve surgery. It may be relevant to assess his upper airway before anesthesia would want to put him back to sleep, intubated set her.   Your pulmonary function testing shows some evidence for abnormal airflow that was not evident in April. This could be related to some abnormality in your upper airway post-surgery, or possibly a new observation of asthma.  We will start Symbicort 80 mcg, 2 puffs twice day to see if this benefits your breathing. Please rinse and gargle after using.  Call our office in 2 weeks to let us know how your breathing is doing.  Depending on your status, we may decide to perform an airway inspection to insure no upper airway abnormalities.  Follow with Dr [name] in 3 weeks or sooner if you have any problems.  Baltazar Apo, MD, PhD 02/24/2017, 1:55 PM [facility] [phone] or if no answer [phone]

## 2017-02-24 NOTE — Assessment & Plan Note (Signed)
He notes that he has had decreased functional capacity postop from his right upper lobe lobectomy. He was initially improving as one might predict but then expressed, location of pulmonary embolism. He continues to be limited and is concerned about this. He has hoarse voice, sometimes hears wheezing. Ordinarily I would ascribe his dyspnea to the predictable recovery period. Surprisingly however his pulmonary function testing from last week show combined restriction and obstruction. This is inconsistent with his spirometry done in April prior to his surgery. Unclear to me why he should develop lower airways obstruction/asthma following a right upper lobe lobectomy, or pulmonary embolism. Certainly full PFT is more sensitive test than office spirometry. In addition, his flow-volume loop is consistent with a traditional obstructive pattern but also has truncation of his inspiratory loop on some of his trials. Raises the possibility of an intermittent upper airway obstruction, possibly related to intubation for his surgery. I spent 45 minutes counseling him and his wife, reviewing pulmonary function testing, reviewing the flow volume loops with him. We discussed symptoms he is experiencing, the potential relationship between these and asthma versus upper airway process. He also had a lot of questions regarding the potential impact of a future valve surgery on his breathing. I tried to answer these given the information that we have available at this time.   At this time I would like to try to further evaluate the potential for obstructive lung disease by starting him empiric clear on a scheduled bronchodilator. If he benefits then this would be consistent with an asthma-type picture. Depending on how he responds then we may decide to perform bronchoscopy with an upper airway inspection to determine whether there is any evidence for vocal cord dysfunction, paralysis, injury or possibly subglottic stenosis.   I don't  believe that either of these issues should preclude him from having his valve surgery. It may be relevant to assess his upper airway before anesthesia would want to put him back to sleep, intubated set her.   Your pulmonary function testing shows some evidence for abnormal airflow that was not evident in April. This could be related to some abnormality in your upper airway post-surgery, or possibly a new observation of asthma.  We will start Symbicort 80 mcg, 2 puffs twice day to see if this benefits your breathing. Please rinse and gargle after using.  Call our office in 2 weeks to let us know how your breathing is doing.  Depending on your status, we may decide to perform an airway inspection to insure no upper airway abnormalities.  Follow with Dr [name] in 3 weeks or sooner if you have any problems.

## 2017-02-24 NOTE — Assessment & Plan Note (Signed)
Continue CPAP as ordered 

## 2017-03-10 NOTE — Progress Notes (Signed)
Cardiology Office Note    Date:  03/16/2017   ID:  James [name], DOB February 13, 1951, MRN [id number]  PCP:  James Lima, MD  Cardiologist:  James Martinique, MD    History of Present Illness:  James [name] is a 66 y.o. male is seen for follow up  MR. History of DM type 2 on oral therapy and HLD. He states he was in good health until he had a flu like illness earlier this year. He was seen by his physician and a heart murmur was noted.  He was noted to have a loud murmur of MR. Careful review of his Echo suggested a partially flail leaflet. MR was graded as mild but this was felt to be an underestimate. A TEE was performed with a flail P3 segment/ruptured chord and severe MR. Cardiac catheterization performed on 08/11/2016 shows 65% mid to distal LAD lesion, FFR borderline 0.81, EF 65%, normal RV and LV filling pressure, normal cardiac output.He was also noted to have a right upper lobe nodule seen on the previous CT, he has been seen by Dr. Lamonte [name] was pulmonology service, PET study obtained on 08/17/2016 showed hypermetabolic solid 2.6 cm anterior right upper lobe pulmonary nodule compatible with prior bronchogenic, no hypermetabolic thoracic lymphadenopathy or distal metastatic disease. There were nonspecific and mild asymmetric hypermetabolism in the left palatine tonsil/left tongue base region. After consultation with Dr. Roxy [name] and Dr. Roxan [name] it was recommended he undergo lobectomy for his lung mass with possible CABG with [name] to LAD and MV repair at a later date.   He underwent RUL lobectomy in April. Pathology positive for adenoCA stage IA. Seen by oncology with plans for follow up with serial CT. No adjuvant chemotherapy planned. He reports that initially he lost his voice and was very SOB. Seen by Dr. Roxan [name] on May 10 and given lasix and antibiotics. 5 days later CXR done and lasix stopped. When seen in June noted worsening SOB. CT angio done and was positive for PE. He was started on  Xarelto. He has since been participating in pulmonary Rehab for the past 3 months.  He has seen Dr. Ricard [name] in follow up. With his ongoing dyspnea we doubled his lasix but he noted no clinical benefit. An ARB was added to try and optimize CHF therapy.Echo was repeated and was unchanged with flail posterior leafltet and normal EF.  He was also seen by Dr. Lamonte [name] with pulmonary with spirometry showing some airway obstruction. He was started on Symbicort. He is planning to proceed with MV repair with CABG by Dr James [name] on November 14.   On follow up today he does note a little improvement in SOB. Some days are worse than others. He able to walk at 3-3.5 mi/hr pace.  He denies edema, orthopnea or PND. No chest pain. Scheduled for repeat CT chest in November next week as well.    Past Medical History:  Diagnosis Date  . Adenocarcinoma of right lung, stage 1 ([place]) 09/06/2016  . Anxiety   . Arthritis    "knees, hips, back" (10/19/2012)  . Chronic lower back pain   . Colon polyps    10/27/2002, repeat letter 09/17/2007  . Coronary artery disease   . Depressive disorder, not elsewhere classified   . Diabetes mellitus without complication (HCC)    diet controlled  . Fasting hyperglycemia   . GERD (gastroesophageal reflux disease)   . Heart murmur   . Hemoptysis    abnormal CT Chest 01/29/10 - ?  new GG changes RUL > not viz on plain cxr 02/26/2010  . Mitral regurgitation    severe MR 08/2016  . MPN (myeloproliferative neoplasm) ([place])    1st detected 06/04/1998  . Obesity   . OSA on CPAP    last sleep study 10 years ago  . Other and unspecified hyperlipidemia   . Positive PPD 1965   "non reactive in 2012" (10/19/2012)  . Routine general medical examination at a health care facility   . Special screening for malignant neoplasm of prostate   . Spinal stenosis, unspecified region other than cervical   . Wrist pain, left     Past Surgical History:  Procedure Laterality Date  . ANTERIOR CRUCIATE LIGAMENT  REPAIR Left 1967  . CARDIAC CATHETERIZATION  2000  . CHEST TUBE INSERTION Right 10/19/2012   post bronch  . COLONOSCOPY W/ POLYPECTOMY    . FLEXIBLE BRONCHOSCOPY  10/19/2012   Flexible video fiberoptic bronchoscopy with electromagnetic navigation and biopsies.  . TONSILLECTOMY  1950's  . TOTAL HIP ARTHROPLASTY Left 10/05/2014   dr James [name]  . WRIST RECONSTRUCTION  12/2009   'proximal row carpectomy" [name]    Current Medications: Outpatient Medications Prior to Visit  Medication Sig Dispense Refill  . acetaminophen (TYLENOL) 325 MG tablet Take 650 mg by mouth every 6 (six) hours as needed for moderate pain or headache.    Marland Kitchen aspirin EC 81 MG tablet Take 1 tablet (81 mg total) by mouth daily. 90 tablet 3  . atorvastatin (LIPITOR) 20 MG tablet Take 1 tablet (20 mg total) by mouth daily. 90 tablet 3  . furosemide (LASIX) 40 MG tablet TAKE 1 TABLET BY MOUTH EVERY DAY (Patient taking differently: TAKE 40 MG BY MOUTH EVERY DAY) 90 tablet 3  . losartan (COZAAR) 25 MG tablet Take 1 tablet (25 mg total) by mouth daily. 30 tablet 6  . Multiple Vitamin (MULTIVITAMIN) tablet Take 1 tablet by mouth daily.    . Omega-3 Fatty Acids (FISH OIL) 500 MG CAPS Take 500 mg by mouth daily.     . potassium chloride SA (K-DUR,KLOR-CON) 20 MEQ tablet Take 1 tablet (20 mEq total) by mouth daily. 30 tablet 1  . psyllium (METAMUCIL) 58.6 % packet Take 1 packet by mouth daily.    . rivaroxaban (XARELTO) 20 MG TABS tablet Take 1 tablet (20 mg total) by mouth daily with supper. 30 tablet 1  . sodium chloride (OCEAN) 0.65 % SOLN nasal spray Place 2 sprays into both nostrils at bedtime.    James [name] Sulfate (ALLERGY RELIEF EYE DROPS OP) Apply 2 drops to eye as needed (for allergies).     . traMADol (ULTRAM) 50 MG tablet Take 50 mg by mouth every 4-6 hours PRN severe pain. (Patient taking differently: Take 50 mg by mouth every 4 (four) hours as needed for moderate pain. ) 30 tablet 0  . Undecylenic Ac-Zn  Undecylenate (FUNGI-NAIL TOE & FOOT EX) Apply 1 application topically daily as needed (for nail).     . budesonide-formoterol (SYMBICORT) 80-4.5 MCG/ACT inhaler Inhale 2 puffs into the lungs 2 (two) times daily. 1 Inhaler 0   No facility-administered medications prior to visit.      Allergies:   Symbicort [budesonide-formoterol fumarate] and Amoxicillin   Social History   Socioeconomic History  . Marital status: Married    Spouse name: None  . Number of children: 3  . Years of education: None  . Highest education level: None  Social Needs  . Financial resource strain:  None  . Food insecurity - worry: None  . Food insecurity - inability: None  . Transportation needs - medical: None  . Transportation needs - non-medical: None  Occupational History  . Occupation: Retired, Financial risk analyst  . Occupation: Retired, Real estate    Comment: Slow  Tobacco Use  . Smoking status: Never Smoker  . Smokeless tobacco: Never Used  Substance and Sexual Activity  . Alcohol use: Yes    Alcohol/week: 0.6 oz    Types: 1 Cans of beer per week  . Drug use: No  . Sexual activity: No    Partners: Female  Other Topics Concern  . None  Social History Narrative   HSG, [employer]. Married '73.  2 sons - '74,   '76;  1 daughter -  '77  6 grandchildren.   Work - IT consultant now retired. ACP - not fully discussed.                     Family History:  The patient's family history includes Heart attack in his father; Heart disease in his father and paternal uncle; Heart failure in his mother.   ROS:   Please see the history of present illness.    ROS All other systems reviewed and are negative.   PHYSICAL EXAM:   VS:  BP (!) 137/91   Pulse 71   Ht 5\' 9"  (1.753 m)   Wt 224 lb (101.6 kg)   BMI 33.08 kg/m    GENERAL:  Well appearing WM in NAD HEENT:  PERRL, EOMI, sclera are clear. Oropharynx is clear. NECK:  No jugular venous distention, carotid upstroke brisk  and symmetric, no bruits, no thyromegaly or adenopathy LUNGS:  Clear to auscultation bilaterally CHEST:  Unremarkable HEART:  RRR,  PMI not displaced or sustained,S1 and S2 within normal limits, no S3, no S4: no clicks, no rubs, gr 3/6 systolic murmur at apex radiating to axilla.  ABD:  Soft, nontender. BS +, no masses or bruits. No hepatomegaly, no splenomegaly EXT:  2 + pulses throughout, no edema, no cyanosis no clubbing SKIN:  Warm and dry.  No rashes NEURO:  Alert and oriented x 3. Cranial nerves II through XII intact. PSYCH:  Cognitively intact    Wt Readings from Last 3 Encounters:  03/16/17 224 lb (101.6 kg)  03/12/17 224 lb (101.6 kg)  02/24/17 224 lb (101.6 kg)      Studies/Labs Reviewed:   EKG:  EKG is not ordered today.    Recent Labs: 07/27/2016: TSH 2.14 02/08/2017: ALT 29; Brain Natriuretic Peptide 4; BUN 18; Creat 1.10; Hemoglobin 15.3; Platelets 201; Potassium 4.1; Sodium 138   Lipid Panel    Component Value Date/Time   CHOL 245 (H) 06/29/2016 1153   TRIG 198.0 (H) 06/29/2016 1153   TRIG 108 05/20/2006 0825   HDL 35.90 (L) 06/29/2016 1153   CHOLHDL 7 06/29/2016 1153   VLDL 39.6 06/29/2016 1153   LDLCALC 170 (H) 06/29/2016 1153   LDLDIRECT 177.0 07/24/2015 1052    Additional studies/ records that were reviewed today include:   Event monitor 08/14/15: Study Highlights   NSR  Average HR 64 bpm No significant arrhythmias No AV block No pauses      Echo: 07/01/16: Study Conclusions  - Left ventricle: The cavity size was mildly dilated. There was   mild concentric hypertrophy. Systolic function was normal. The   estimated ejection fraction was in the range of 60% to 65%. Wall  motion was normal; there were no regional wall motion   abnormalities. Left ventricular diastolic function parameters   were normal. - Aortic valve: Transvalvular velocity was within the normal range.   There was no stenosis. There was no regurgitation. - Mitral valve:  Transvalvular velocity was within the normal range.   There was no evidence for stenosis. There was mild regurgitation. - Left atrium: The atrium was moderately dilated. - Right ventricle: The cavity size was normal. Wall thickness was   normal. Systolic function was normal. - Atrial septum: No defect or patent foramen ovale was identified   by color flow Doppler. - Tricuspid valve: There was trivial regurgitation. - Pulmonary arteries: Systolic pressure was within the normal   range. PA peak pressure: 24 mm Hg (S).  TEE 07/17/16: Study Conclusions  - Left ventricle: The cavity size was normal. There was mild   concentric hypertrophy. Systolic function was normal. The   estimated ejection fraction was in the range of 60% to 65%. Wall   motion was normal; there were no regional wall motion   abnormalities. - Aortic valve: No evidence of vegetation. - Mitral valve: There is a partially flail P3 segment due to   ruptured cord, seen prolapsing in both 2D and 3D images. There is   severe, posterolaterally directed mitral regurgitation which   swirls into the left atrial appendage. - Left atrium: The atrium was dilated. - Right atrium: No evidence of thrombus in the atrial cavity or   appendage. - Atrial septum: No defect or patent foramen ovale was identified.  Impressions:  - Flail P3 segment of the mitral leaflet with a rupture cord and   severe, posterolaterally directed MR which swirls in the LAA.   LVEF 60-65%.  Procedures   Intravascular Pressure Wire/FFR Study  Right/Left Heart Cath and Coronary Angiography  Conclusion     Prox LAD to Mid LAD lesion, 30 %stenosed.  Mid LAD to Dist LAD lesion, 65 %stenosed.  Prox RCA to Mid RCA lesion, 15 %stenosed.  The left ventricular systolic function is normal.  LV end diastolic pressure is normal.  The left ventricular ejection fraction is 55-65% by visual estimate.  LV end diastolic pressure is normal.   1. Borderline  single vessel obstructive CAD involving the mid LAD. FFR 0.81 2. Normal LV function EF 65% 3. Normal right heart and LV filling pressures 4. Normal Cardiac output    Echo 02/15/17: Study Conclusions  - Left ventricle: The cavity size was normal. Wall thickness was   normal. Systolic function was normal. The estimated ejection   fraction was in the range of 60% to 65%. - Aortic valve: AV is thickened, calcified with minimally   restricted motion. - Mitral valve: Prolapse fo the posterior mitral leaflet. MR is at   least moderate in intensity Calcified annulus. Mildly thickened   leaflets . - Left atrium: The atrium was mildly dilated.   ASSESSMENT:    1. Severe mitral regurgitation   2. Coronary artery disease involving native coronary artery of native heart without angina pectoris   3. Hyperlipidemia, unspecified hyperlipidemia type      PLAN:  In order of problems listed above:  1. Severe MR. TEE confirmed a ruptured chord involving the P3 leaflet with severe MR.  He does have persistent dyspnea. Plan to proceed with  MV repair next week as well as CABG to the LAD 2. AdenoCA lung s/p lobectomy. Stage 1A. No adjuvant chemo planned. Repeat CT next week. 3.  CAD with borderline LAD stenosis FFR at cut off. Will plan [name] when MV repair done. 4. Mixed hyperlipidemia. Now on high dose statin therapy. 5.   Pulmonary embolus June 2018.  On Xarelto. Since this was a provoked event I would recommend stopping Xarelto after 6 months.  Medication Adjustments/Labs and Tests Ordered: Current medicines are reviewed at length with the patient today.  Concerns regarding medicines are outlined above.  Medication changes, Labs and Tests ordered today are listed in the Patient Instructions below. Patient Instructions  Continue your current therapy  Good luck with your surgery      Signed, James Martinique, MD  03/16/2017 12:45 PM    [facility] [address]  [place], [place], [address] [phone]

## 2017-03-12 ENCOUNTER — Ambulatory Visit (INDEPENDENT_AMBULATORY_CARE_PROVIDER_SITE_OTHER): Payer: Medicare Other | Admitting: Emergency Medicine

## 2017-03-12 ENCOUNTER — Encounter: Payer: Self-pay | Admitting: Emergency Medicine

## 2017-03-12 DIAGNOSIS — G4733 Obstructive sleep apnea (adult) (pediatric): Secondary | ICD-10-CM | POA: Diagnosis not present

## 2017-03-12 DIAGNOSIS — R0609 Other forms of dyspnea: Secondary | ICD-10-CM | POA: Diagnosis not present

## 2017-03-12 DIAGNOSIS — C3491 Malignant neoplasm of unspecified part of right bronchus or lung: Secondary | ICD-10-CM

## 2017-03-12 DIAGNOSIS — Z9989 Dependence on other enabling machines and devices: Secondary | ICD-10-CM | POA: Diagnosis not present

## 2017-03-12 NOTE — Assessment & Plan Note (Signed)
Status post resection, hopefully for cure.  He is on observation only

## 2017-03-12 NOTE — Assessment & Plan Note (Signed)
Good compliance with CPAP.  I asked him to ensure that he wears it postoperatively or at least as soon as feasible postoperatively.

## 2017-03-12 NOTE — Patient Instructions (Addendum)
Agree with stopping Symbicort Continue your xarelto as you are taking it.  You are OK to proceed with your valve surgery  We will revisit starting inhaled medication in the future.  Follow with Dr [name] in 4 months or sooner if you have any problems.

## 2017-03-12 NOTE — Progress Notes (Signed)
Subjective:    Patient ID: James [name], male    DOB: 1950-09-25, 66 y.o.   MRN: [id number]  HPI  66 year old never smoker with a history of a positive PPD as a child, then was non reactive in 2012. Seen by me in 09/2012 for a RUL nodule. At the time it was an irregular groundglass nodule. It had been followed in our office since 2011 and was slowly enlarging. We arranged for navigational bronchoscopy that was non-diagnostic. Since that time I have not seen him, he has been lost to follow-up. Repeat CT scan of his chest ordered by Dr James [name] 08/05/16 after he had a viral syndrome has been personally reviewed by me. The right upper lobe nodule is now an irregular, solid 2.5 cm lesion without any GG component. Also noted was some evidence for possible chronic pulmonary embolism in a left lower lobe segmental pulmonary arterial branch.  He has been scheduled to undergo left and right cardiac catheterization for a mitral valve flail leaflet. He is asymptomatic - no cough, dyspnea. He occasionally has mid R chest pain.   ROV 08/20/16 -- follow up visit for evaluation of an enlarging RUL nodule. He also is being followed by Dr James [name] for flail MV leaflet and possible repair. He underwent L and R heart cath as below. Showed CAD, normal CO, normal R and L heart filling pressures. I performed a PET scan 4/9 and have personally reviewed. Shows that the RUL nodule is hypermetabolic, no hilar or mediastinal LAD seen. No distant disease.   ROV 02/24/17 -- This follow-up visit for patient with a history of right upper lobe groundglass nodule, underwent surgical resection on 09/02/16. The pathological diagnosis was consistent with adenocarcinoma of the lung. He did not require any chemo. To be followed with serial scans. Course c/b PE in 10/2016, now on Xarelto. He has had some persistent dyspnea, has remained limited.  He describes exertional SOB with carrying heavy object. He has had a hoarse voice since sgy. He may have some  wheeze with exertion. He is reliable with his CPAP.   He underwent pulmonary function testing 02/16/17 that showed evidence for mixed restriction and obstruction with a normal FEV1 to FVC ratio, FEV1 severely reduced at 49% predicted, hyperinflated lung volumes based on RV, mildly reduced diffusion capacity that corrected to the normal range when adjusted for his alveolar volume. His F/v loop looked normal on the pre-BD curve, showed a possible fixed insp truncation.   ROV 03/12/17 --patient has a history of right upper lobe adenocarcinoma that required resection, pulmonary embolism currently on Xarelto, struct of sleep apnea on CPAP.  We are evaluating him for dyspnea and pulmonary function testing has shown mixed restriction and obstruction.  At his last visit we did a trial of Symbicort to see if he would benefit.  Unfortunately he tells me that he had significant cough, hoarseness, increased dyspnea, some mid chest pain.    Review of Systems  Constitutional: Negative for fever and unexpected weight change.  HENT: Negative for congestion, dental problem, ear pain, nosebleeds, postnasal drip, rhinorrhea, sinus pressure, sneezing, sore throat and trouble swallowing.   Eyes: Negative for redness and itching.  Respiratory: Negative for cough, chest tightness, shortness of breath and wheezing.   Cardiovascular: Negative for palpitations and leg swelling.  Gastrointestinal: Negative for nausea and vomiting.  Genitourinary: Negative for dysuria.  Musculoskeletal: Negative for joint swelling.  Skin: Negative for rash.  Neurological: Negative for headaches.  Hematological: Does not  bruise/bleed easily.  Psychiatric/Behavioral: Negative for dysphoric mood. The patient is not nervous/anxious.     Past Medical History:  Diagnosis Date  . Adenocarcinoma of right lung, stage 1 ([place]) 09/06/2016  . Anxiety   . Arthritis    "knees, hips, back" (10/19/2012)  . Chronic lower back pain   . Colon polyps     10/27/2002, repeat letter 09/17/2007  . Coronary artery disease   . Depressive disorder, not elsewhere classified   . Diabetes mellitus without complication (HCC)    diet controlled  . Fasting hyperglycemia   . GERD (gastroesophageal reflux disease)   . Heart murmur   . Hemoptysis    abnormal CT Chest 01/29/10 - ? new GG changes RUL > not viz on plain cxr 02/26/2010  . Mitral regurgitation    severe MR 08/2016  . MPN (myeloproliferative neoplasm) ([place])    1st detected 06/04/1998  . Obesity   . OSA on CPAP    last sleep study 10 years ago  . Other and unspecified hyperlipidemia   . Positive PPD 1965   "non reactive in 2012" (10/19/2012)  . Routine general medical examination at a health care facility   . Special screening for malignant neoplasm of prostate   . Spinal stenosis, unspecified region other than cervical   . Wrist pain, left      Family History  Problem Relation Age of Onset  . Heart failure Mother   . Heart disease Father   . Heart attack Father   . Heart disease Paternal Uncle   . Prostate cancer Neg Hx   . Colon cancer Neg Hx   . Hypertension Neg Hx   . Hyperlipidemia Neg Hx   . Diabetes Neg Hx      Social History   Social History  . Marital status: Married    Spouse name: N/A  . Number of children: 3  . Years of education: N/A   Occupational History  . Retired, Financial risk analyst   . Retired, Real estate     Slow   Social History Main Topics  . Smoking status: Never Smoker  . Smokeless tobacco: Never Used  . Alcohol use 0.6 oz/week    1 Cans of beer per week  . Drug use: No  . Sexual activity: No   Other Topics Concern  . Not on file   Social History Narrative   HSG, [employer]. Married '73.  2 sons - '74,   '76;  1 daughter -  '77  6 grandchildren.   Work - IT consultant now retired. ACP - not fully discussed.                     Allergies  Allergen Reactions  . Amoxicillin Itching and Other (See Comments)     Has patient had a PCN reaction causing immediate rash, facial/tongue/throat swelling, SOB or lightheadedness with hypotension: No Has patient had a PCN reaction causing severe rash involving mucus membranes or skin necrosis: No Has patient had a PCN reaction that required hospitalization No Has patient had a PCN reaction occurring within the last 10 years: No If all of the above answers are "NO", then may proceed with Cephalosporin use.      Outpatient Medications Prior to Visit  Medication Sig Dispense Refill  . acetaminophen (TYLENOL) 325 MG tablet Take 650 mg by mouth every 6 (six) hours as needed for moderate pain or headache.    Marland Kitchen aspirin EC 81  MG tablet Take 1 tablet (81 mg total) by mouth daily. 90 tablet 3  . atorvastatin (LIPITOR) 20 MG tablet Take 1 tablet (20 mg total) by mouth daily. 90 tablet 3  . budesonide-formoterol (SYMBICORT) 80-4.5 MCG/ACT inhaler Inhale 2 puffs into the lungs 2 (two) times daily. 1 Inhaler 0  . furosemide (LASIX) 40 MG tablet TAKE 1 TABLET BY MOUTH EVERY DAY (Patient taking differently: TAKE 40 MG BY MOUTH EVERY DAY) 90 tablet 3  . losartan (COZAAR) 25 MG tablet Take 1 tablet (25 mg total) by mouth daily. 30 tablet 6  . Multiple Vitamin (MULTIVITAMIN) tablet Take 1 tablet by mouth daily.    . Omega-3 Fatty Acids (FISH OIL) 500 MG CAPS Take 500 mg by mouth daily.     . potassium chloride SA (K-DUR,KLOR-CON) 20 MEQ tablet Take 1 tablet (20 mEq total) by mouth daily. 30 tablet 1  . psyllium (METAMUCIL) 58.6 % packet Take 1 packet by mouth daily.    . rivaroxaban (XARELTO) 20 MG TABS tablet Take 1 tablet (20 mg total) by mouth daily with supper. 30 tablet 1  . sodium chloride (OCEAN) 0.65 % SOLN nasal spray Place 2 sprays into both nostrils at bedtime.    Bethann Humble Sulfate (ALLERGY RELIEF EYE DROPS OP) Apply 2 drops to eye as needed (for allergies).     . traMADol (ULTRAM) 50 MG tablet Take 50 mg by mouth every 4-6 hours PRN severe pain. (Patient  taking differently: Take 50 mg by mouth every 4 (four) hours as needed for moderate pain. ) 30 tablet 0  . Undecylenic Ac-Zn Undecylenate (FUNGI-NAIL TOE & FOOT EX) Apply 1 application topically daily as needed (for nail).      No facility-administered medications prior to visit.         Objective:   Physical Exam Vitals:   03/12/17 1350  BP: (!) 148/89  Pulse: 83  SpO2: 97%  Weight: 224 lb (101.6 kg)  Height: 5\' 9"  (1.753 m)   Gen: Pleasant overweight man, somewhat anxious, in no distress  ENT:no oral lesions  Neck: No JVD, no stridor  Lungs: No accessory muscle use, clear B   Cardiovascular: regular, 2/6 systolic M  Musculoskeletal: no clubbing, no cyanosis  Neuro: awake, alert, non-focal.   Skin: no rash.       Assessment & Plan:  OSA on CPAP Good compliance with CPAP.  I asked him to ensure that he wears it postoperatively or at least as soon as feasible postoperatively.   Adenocarcinoma of right lung, stage 1 (HCC) Status post resection, hopefully for cure.  He is on observation only  Dyspnea on exertion Pulmonary function testing showed mixed obstruction and restriction.  Suspect a component of asthma superimposed on his obesity and history of lobectomy.  Trial of Symbicort failed due to upper airway irritation, cough, hoarseness.  I believe will be reasonable to try an alternative bronchodilator in the future.  We will wait until after his valve surgery to try a new medication.  Baltazar Apo, MD, PhD 03/12/2017, 2:12 PM [facility] [phone] or if no answer [phone]

## 2017-03-12 NOTE — Assessment & Plan Note (Signed)
Pulmonary function testing showed mixed obstruction and restriction.  Suspect a component of asthma superimposed on his obesity and history of lobectomy.  Trial of Symbicort failed due to upper airway irritation, cough, hoarseness.  I believe will be reasonable to try an alternative bronchodilator in the future.  We will wait until after his valve surgery to try a new medication.

## 2017-03-15 ENCOUNTER — Other Ambulatory Visit: Payer: Self-pay | Admitting: Thoracic Surgery (Cardiothoracic Vascular Surgery)

## 2017-03-16 ENCOUNTER — Encounter: Payer: Self-pay | Admitting: Cardiology

## 2017-03-16 ENCOUNTER — Ambulatory Visit: Payer: Medicare Other | Admitting: Cardiology

## 2017-03-16 VITALS — BP 137/91 | HR 71 | Ht 69.0 in | Wt 224.0 lb

## 2017-03-16 DIAGNOSIS — I2510 Atherosclerotic heart disease of native coronary artery without angina pectoris: Secondary | ICD-10-CM

## 2017-03-16 DIAGNOSIS — E785 Hyperlipidemia, unspecified: Secondary | ICD-10-CM | POA: Diagnosis not present

## 2017-03-16 DIAGNOSIS — I340 Nonrheumatic mitral (valve) insufficiency: Secondary | ICD-10-CM | POA: Diagnosis not present

## 2017-03-16 NOTE — Patient Instructions (Signed)
Continue your current therapy  Good luck with your surgery

## 2017-03-19 ENCOUNTER — Encounter: Payer: Self-pay | Admitting: Emergency Medicine

## 2017-03-19 DIAGNOSIS — G4733 Obstructive sleep apnea (adult) (pediatric): Secondary | ICD-10-CM

## 2017-03-19 NOTE — Telephone Encounter (Signed)
Pt is requesting an order for a new cpap machine to be sent to his DME, [facility].    RB please advise if ok to order.  Thanks!

## 2017-03-19 NOTE — Pre-Procedure Instructions (Signed)
James [name]  03/19/2017      [id number] [address], [place] [address] Phone: [phone] Fax: [phone]    Your procedure is scheduled on November 14  Report to [facility] at [place].M.  Call this number if you have problems the morning of surgery:  [phone]   Remember:  Do not eat food or drink liquids after midnight.  Continue all other medications as directed by your physician except follow these medication instructions before surgery   Take these medicines the morning of surgery with A SIP OF WATER  acetaminophen (TYLENOL) traMADol (ULTRAM)  7 days prior to surgery STOP taking any Aspirin (unless otherwise instructed by your surgeon), Aleve, Naproxen, Ibuprofen, Motrin, Advil, Goody's, BC's, all herbal medications, fish oil, and all vitamins  FOLLOW PHYSICIANS INSTRUCTIONS ABOUT rivaroxaban (XARELTO)   Do not wear jewelry  Do not wear lotions, powders, or cologne, or deoderant.  Men may shave face and neck.  Do not bring valuables to the hospital.  [facility] is not responsible for any belongings or valuables.  Contacts, dentures or bridgework may not be worn into surgery.  Leave your suitcase in the car.  After surgery it may be brought to your room.  For patients admitted to the hospital, discharge time will be determined by your treatment team.  Patients discharged the day of surgery will not be allowed to drive home.   Special instructions:   [place]- Preparing For Surgery  Before surgery, you can play an important role. Because skin is not sterile, your skin needs to be as free of germs as possible. You can reduce the number of germs on your skin by washing with CHG (chlorahexidine gluconate) Soap before surgery.  CHG is an antiseptic cleaner which kills germs and bonds with the skin to continue killing germs even after washing.  Please do not use if you have an  allergy to CHG or antibacterial soaps. If your skin becomes reddened/irritated stop using the CHG.  Do not shave (including legs and underarms) for at least 48 hours prior to first CHG shower. It is OK to shave your face.  Please follow these instructions carefully.   1. Shower the NIGHT BEFORE SURGERY and the MORNING OF SURGERY with CHG.   2. If you chose to wash your hair, wash your hair first as usual with your normal shampoo.  3. After you shampoo, rinse your hair and body thoroughly to remove the shampoo.  4. Use CHG as you would any other liquid soap. You can apply CHG directly to the skin and wash gently with a scrungie or a clean washcloth.   5. Apply the CHG Soap to your body ONLY FROM THE NECK DOWN.  Do not use on open wounds or open sores. Avoid contact with your eyes, ears, mouth and genitals (private parts). Wash Face and genitals (private parts)  with your normal soap.  6. Wash thoroughly, paying special attention to the area where your surgery will be performed.  7. Thoroughly rinse your body with warm water from the neck down.  8. DO NOT shower/wash with your normal soap after using and rinsing off the CHG Soap.  9. Pat yourself dry with a CLEAN TOWEL.  10. Wear CLEAN PAJAMAS to bed the night before surgery, wear comfortable clothes the morning of surgery  11. Place CLEAN SHEETS on your bed the night of your first shower and DO  NOT SLEEP WITH PETS.    Day of Surgery: Do not apply any deodorants/lotions. Please wear clean clothes to the hospital/surgery center.      Please read over the following fact sheets that you were given.

## 2017-03-22 ENCOUNTER — Other Ambulatory Visit: Payer: Self-pay

## 2017-03-22 ENCOUNTER — Ambulatory Visit: Payer: Medicare Other | Admitting: Thoracic Surgery (Cardiothoracic Vascular Surgery)

## 2017-03-22 ENCOUNTER — Encounter (HOSPITAL_COMMUNITY): Payer: Self-pay

## 2017-03-22 ENCOUNTER — Ambulatory Visit (HOSPITAL_COMMUNITY)
Admission: RE | Admit: 2017-03-22 | Discharge: 2017-03-22 | Disposition: A | Discharge status: Home / Self Care | Payer: Medicare Other | Source: Ambulatory Visit | Attending: Thoracic Surgery (Cardiothoracic Vascular Surgery) | Admitting: Thoracic Surgery (Cardiothoracic Vascular Surgery)

## 2017-03-22 ENCOUNTER — Encounter: Payer: Self-pay | Admitting: Thoracic Surgery (Cardiothoracic Vascular Surgery)

## 2017-03-22 ENCOUNTER — Ambulatory Visit
Admission: RE | Admit: 2017-03-22 | Discharge: 2017-03-22 | Disposition: A | Discharge status: Home / Self Care | Payer: Medicare Other | Source: Ambulatory Visit | Attending: Thoracic Surgery (Cardiothoracic Vascular Surgery) | Admitting: Thoracic Surgery (Cardiothoracic Vascular Surgery)

## 2017-03-22 ENCOUNTER — Ambulatory Visit (HOSPITAL_BASED_OUTPATIENT_CLINIC_OR_DEPARTMENT_OTHER)
Admission: RE | Admit: 2017-03-22 | Discharge: 2017-03-22 | Disposition: A | Discharge status: Home / Self Care | Payer: Medicare Other | Source: Ambulatory Visit | Attending: Thoracic Surgery (Cardiothoracic Vascular Surgery) | Admitting: Thoracic Surgery (Cardiothoracic Vascular Surgery)

## 2017-03-22 VITALS — BP 129/79 | HR 63 | Resp 16 | Ht 69.0 in | Wt 224.0 lb

## 2017-03-22 DIAGNOSIS — E785 Hyperlipidemia, unspecified: Secondary | ICD-10-CM

## 2017-03-22 DIAGNOSIS — C3411 Malignant neoplasm of upper lobe, right bronchus or lung: Secondary | ICD-10-CM

## 2017-03-22 DIAGNOSIS — Z01818 Encounter for other preprocedural examination: Secondary | ICD-10-CM

## 2017-03-22 DIAGNOSIS — I340 Nonrheumatic mitral (valve) insufficiency: Secondary | ICD-10-CM

## 2017-03-22 DIAGNOSIS — Z01812 Encounter for preprocedural laboratory examination: Secondary | ICD-10-CM

## 2017-03-22 DIAGNOSIS — E669 Obesity, unspecified: Secondary | ICD-10-CM | POA: Insufficient documentation

## 2017-03-22 DIAGNOSIS — I509 Heart failure, unspecified: Secondary | ICD-10-CM

## 2017-03-22 DIAGNOSIS — I6523 Occlusion and stenosis of bilateral carotid arteries: Secondary | ICD-10-CM | POA: Insufficient documentation

## 2017-03-22 DIAGNOSIS — R0602 Shortness of breath: Secondary | ICD-10-CM

## 2017-03-22 DIAGNOSIS — Z01810 Encounter for preprocedural cardiovascular examination: Secondary | ICD-10-CM

## 2017-03-22 DIAGNOSIS — I2510 Atherosclerotic heart disease of native coronary artery without angina pectoris: Secondary | ICD-10-CM

## 2017-03-22 DIAGNOSIS — I110 Hypertensive heart disease with heart failure: Secondary | ICD-10-CM

## 2017-03-22 DIAGNOSIS — I2699 Other pulmonary embolism without acute cor pulmonale: Secondary | ICD-10-CM | POA: Diagnosis not present

## 2017-03-22 DIAGNOSIS — G4730 Sleep apnea, unspecified: Secondary | ICD-10-CM

## 2017-03-22 DIAGNOSIS — E119 Type 2 diabetes mellitus without complications: Secondary | ICD-10-CM

## 2017-03-22 DIAGNOSIS — F419 Anxiety disorder, unspecified: Secondary | ICD-10-CM | POA: Insufficient documentation

## 2017-03-22 DIAGNOSIS — Z85118 Personal history of other malignant neoplasm of bronchus and lung: Secondary | ICD-10-CM

## 2017-03-22 DIAGNOSIS — Z86711 Personal history of pulmonary embolism: Secondary | ICD-10-CM | POA: Insufficient documentation

## 2017-03-22 DIAGNOSIS — R490 Dysphonia: Secondary | ICD-10-CM | POA: Insufficient documentation

## 2017-03-22 DIAGNOSIS — K219 Gastro-esophageal reflux disease without esophagitis: Secondary | ICD-10-CM

## 2017-03-22 DIAGNOSIS — F329 Major depressive disorder, single episode, unspecified: Secondary | ICD-10-CM | POA: Insufficient documentation

## 2017-03-22 DIAGNOSIS — Z902 Acquired absence of lung [part of]: Secondary | ICD-10-CM | POA: Diagnosis not present

## 2017-03-22 HISTORY — DX: Essential (primary) hypertension: I10

## 2017-03-22 HISTORY — DX: Pneumonia, unspecified organism: J18.9

## 2017-03-22 HISTORY — DX: Dyspnea, unspecified: R06.00

## 2017-03-22 HISTORY — DX: Peripheral vascular disease, unspecified: I73.9

## 2017-03-22 LAB — URINALYSIS, ROUTINE W REFLEX MICROSCOPIC
Bilirubin Urine: NEGATIVE
Glucose, UA: NEGATIVE mg/dL
Hgb urine dipstick: NEGATIVE
Ketones, ur: NEGATIVE mg/dL
Leukocytes, UA: NEGATIVE
Nitrite: NEGATIVE
Protein, ur: NEGATIVE mg/dL
Specific Gravity, Urine: 1.015 (ref 1.005–1.030)
pH: 6 (ref 5.0–8.0)

## 2017-03-22 LAB — CBC
HCT: 45.6 % (ref 39.0–52.0)
Hemoglobin: 15.2 g/dL (ref 13.0–17.0)
MCH: 30.3 pg (ref 26.0–34.0)
MCHC: 33.3 g/dL (ref 30.0–36.0)
MCV: 91 fL (ref 78.0–100.0)
Platelets: 214 10*3/uL (ref 150–400)
RBC: 5.01 MIL/uL (ref 4.22–5.81)
RDW: 14.5 % (ref 11.5–15.5)
WBC: 6.2 10*3/uL (ref 4.0–10.5)

## 2017-03-22 LAB — COMPREHENSIVE METABOLIC PANEL
ALT: 36 U/L (ref 17–63)
AST: 31 U/L (ref 15–41)
Albumin: 3.9 g/dL (ref 3.5–5.0)
Alkaline Phosphatase: 59 U/L (ref 38–126)
Anion gap: 10 (ref 5–15)
BUN: 18 mg/dL (ref 6–20)
CO2: 22 mmol/L (ref 22–32)
Calcium: 9.3 mg/dL (ref 8.9–10.3)
Chloride: 105 mmol/L (ref 101–111)
Creatinine, Ser: 0.99 mg/dL (ref 0.61–1.24)
GFR calc Af Amer: >60 mL/min (ref >60)
GFR calc non Af Amer: >60 mL/min (ref >60)
Glucose, Bld: 125 mg/dL — ABNORMAL HIGH (ref 65–99)
Potassium: 4 mmol/L (ref 3.5–5.1)
Sodium: 137 mmol/L (ref 135–145)
Total Bilirubin: 0.7 mg/dL (ref 0.3–1.2)
Total Protein: 6.6 g/dL (ref 6.5–8.1)

## 2017-03-22 LAB — HEMOGLOBIN A1C
Hgb A1c MFr Bld: 6.5 % — ABNORMAL HIGH (ref 4.8–5.6)
Mean Plasma Glucose: 139.85 mg/dL

## 2017-03-22 LAB — GLUCOSE, CAPILLARY: Glucose-Capillary: 119 mg/dL — ABNORMAL HIGH (ref 65–99)

## 2017-03-22 LAB — TYPE AND SCREEN
ABO/RH(D): O POS
Antibody Screen: NEGATIVE

## 2017-03-22 LAB — SURGICAL PCR SCREEN
MRSA, PCR: NEGATIVE
Staphylococcus aureus: NEGATIVE

## 2017-03-22 LAB — APTT: aPTT: 27 seconds (ref 24–36)

## 2017-03-22 LAB — PROTIME-INR
INR: 0.95
Prothrombin Time: 12.6 seconds (ref 11.4–15.2)

## 2017-03-22 MED ORDER — IOPAMIDOL (ISOVUE-370) INJECTION 76%
75.0000 mL | Freq: Once | INTRAVENOUS | Status: AC | PRN
  Administered 2017-03-22: 75 mL via INTRAVENOUS

## 2017-03-22 NOTE — Patient Instructions (Signed)
Do not take Xarelto as previously instructed  Stop taking aspirin and fish oil capsules  Continue taking all other medications without change through the day before surgery.  Have nothing to eat or drink after midnight the night before surgery.  On the morning of surgery do not take any of your regular medications

## 2017-03-22 NOTE — Pre-Procedure Instructions (Addendum)
Elenor Quinones  03/22/2017      [id number] [address], [place] [address] Phone: [phone] Fax: [phone]    Your procedure is scheduled on November 14  Report to [facility] at [place].M.  Call this number if you have problems the morning of surgery:  [phone]   Remember:  Do not eat food or drink liquids after midnight.  Continue all other medications as directed by your physician except follow these medication instructions before surgery   Take these medicines the morning of surgery with A SIP OF WATER  acetaminophen (TYLENOL) or traMADol (ULTRAM) if needed  Today11/12/18 STOP taking any Aspirin (unless otherwise instructed by your surgeon), Aleve, Naproxen, Ibuprofen, Motrin, Advil, Goody's, BC's, all herbal medications, fish oil, and all vitamins  FOLLOW PHYSICIANS INSTRUCTIONS ABOUT rivaroxaban (XARELTO) (1 week prior per note) 03/15/17 last dose Stop Aspirin per dr   Lazaro Arms not wear jewelry  Do not wear lotions, powders, or cologne, or deoderant.  Men may shave face and neck.  Do not bring valuables to the hospital.  [facility] is not responsible for any belongings or valuables.  Contacts, dentures or bridgework may not be worn into surgery.  Leave your suitcase in the car.  After surgery it may be brought to your room.  For patients admitted to the hospital, discharge time will be determined by your treatment team.  Patients discharged the day of surgery will not be allowed to drive home.   Special instructions:   - Preparing For Surgery  Before surgery, you can play an important role. Because skin is not sterile, your skin needs to be as free of germs as possible. You can reduce the number of germs on your skin by washing with CHG (chlorahexidine gluconate) Soap before surgery.  CHG is an antiseptic cleaner which kills germs and bonds with the skin to continue killing  germs even after washing.  Please do not use if you have an allergy to CHG or antibacterial soaps. If your skin becomes reddened/irritated stop using the CHG.  Do not shave (including legs and underarms) for at least 48 hours prior to first CHG shower. It is OK to shave your face.  Please follow these instructions carefully.   1. Shower the NIGHT BEFORE SURGERY and the MORNING OF SURGERY with CHG.   2. If you chose to wash your hair, wash your hair first as usual with your normal shampoo.  3. After you shampoo, rinse your hair and body thoroughly to remove the shampoo.  4. Use CHG as you would any other liquid soap. You can apply CHG directly to the skin and wash gently with a scrungie or a clean washcloth.   5. Apply the CHG Soap to your body ONLY FROM THE NECK DOWN.  Do not use on open wounds or open sores. Avoid contact with your eyes, ears, mouth and genitals (private parts). Wash Face and genitals (private parts)  with your normal soap.  6. Wash thoroughly, paying special attention to the area where your surgery will be performed.  7. Thoroughly rinse your body with warm water from the neck down.  8. DO NOT shower/wash with your normal soap after using and rinsing off the CHG Soap.  9. Pat yourself dry with a CLEAN TOWEL.  10. Wear CLEAN PAJAMAS to bed the night before surgery, wear comfortable clothes the morning of surgery  11. Place CLEAN SHEETS  on your bed the night of your first shower and DO NOT SLEEP WITH PETS.    Day of Surgery: Do not apply any deodorants/lotions. Please wear clean clothes to the hospital/surgery center.      Please read over the  fact sheets that you were given.

## 2017-03-22 NOTE — Progress Notes (Addendum)
Vascular prelim. Carotid duplex: 1-39% ICA stenosis. UE Doppler: palmar arch Right WNL with rad compression, decrease 50% with uln compression. Left WNL. ABI WNL.  Landry Mellow, RDMS, RVT

## 2017-03-22 NOTE — Progress Notes (Signed)
[address]       [place] [address]             [phone]     CARDIOTHORACIC SURGERY OFFICE NOTE  Referring Provider is Martinique, Peter M, MD PCP is [name], MD   HPI:  Patient is a 66 year old male with history of mitral valve prolapse with severe symptomatic primary mitral regurgitation, lung cancer, obstructive sleep apnea on CPAP, type 2 diabetes mellitus, and hyperlipidemia who returns to the office today for follow-up of mitral regurgitation with tentative plans to proceed with mitral valve repair and coronary artery bypass grafting on Wednesday, March 24, 2017. I initially had the opportunity to evaluate the patient in consultation on 08/21/2016.  Prior to that transthoracic and transesophageal echocardiograms revealed the presence of mitral valve prolapse with severe mitral regurgitation and normal left ventricular systolic function. Diagnostic cardiac catheterization revealed a long segment 65% stenosis of the left anterior descending coronary artery with borderline significant hemodynamic parameters on FFR analysis. At the same time the patient was: Incidentally noted to have a 2.6 cm mass in the right upper lobe consistent with primary lung cancer, clinical stage I.  The decision was made to manage the patient's cardiac disease medically over the short-term to facilitate expedient treatment of the newly discovered lung cancer. The patient underwent uncomplicated video assisted right upper lobectomy with mediastinal lymph node dissection by Dr. [name] on 09/02/2016. Final pathology was consistent with moderately differentiated adenocarcinoma with negative surgical margins and no evidence for visceral pleural invasion nor lymphovascular invasion. All lymph nodes were negative, consistent with pathologic stage IA (T1c N0 M0).  The patient was seen in consultation by Dr. Inda Merlin at the cancer center. Adjuvant chemotherapy was not recommended.  The  patient has overall done fairly well, although he was readmitted to the hospital in early June with shortness of breath that was felt to be primarily related to volume overload. CT angiogram at that time revealed what was felt to be a very small pulmonary embolus in the right lower lobe.  Xarelto was started at that time.    I saw him in follow-up in early October at which time we discussed the possibility of proceeding with elective mitral valve repair and coronary artery bypass grafting in the near future.  He underwent follow-up transthoracic echocardiogram and pulmonary function testing, and he has recently been seen in follow-up by [name] and [name].  He returns the office today with tentative plans to proceed with mitral valve repair and coronary artery bypass grafting later this week.  He reports that over the past month he has continued to see gradual improvement in his exercise tolerance.  He still gets short of breath with activity but he has made further progress, and he has now been walking more than 3 miles at a time without significant limitations.  Overall he feels well and he looks forward to proceeding with surgery as previously scheduled.  He stopped taking Xarelto last week.  The remainder of his review of systems is unremarkable.  Current Outpatient Medications  Medication Sig Dispense Refill  . acetaminophen (TYLENOL) 325 MG tablet Take 650 mg by mouth every 6 (six) hours as needed for moderate pain or headache.    Marland Kitchen aspirin EC 81 MG tablet Take 1 tablet (81 mg total) by mouth daily. 90 tablet 3  . atorvastatin (LIPITOR) 20 MG tablet Take 1 tablet (20 mg total) by mouth daily. 90 tablet  3  . furosemide (LASIX) 40 MG tablet TAKE 1 TABLET BY MOUTH EVERY DAY (Patient taking differently: TAKE 40 MG BY MOUTH EVERY DAY) 90 tablet 3  . losartan (COZAAR) 25 MG tablet Take 1 tablet (25 mg total) by mouth daily. 30 tablet 6  . Multiple Vitamin (MULTIVITAMIN) tablet Take 1 tablet by mouth  daily.    . Omega-3 Fatty Acids (FISH OIL) 500 MG CAPS Take 500 mg by mouth daily.     . potassium chloride SA (K-DUR,KLOR-CON) 20 MEQ tablet Take 1 tablet (20 mEq total) by mouth daily. 30 tablet 1  . psyllium (METAMUCIL) 58.6 % packet Take 1 packet by mouth daily.    . sodium chloride (OCEAN) 0.65 % SOLN nasal spray Place 2 sprays into both nostrils at bedtime.    Bethann Humble Sulfate (ALLERGY RELIEF EYE DROPS OP) Apply 2 drops to eye as needed (for allergies).     . traMADol (ULTRAM) 50 MG tablet Take 50 mg by mouth every 4-6 hours PRN severe pain. (Patient taking differently: Take 50 mg by mouth every 4 (four) hours as needed for moderate pain. ) 30 tablet 0  . Undecylenic Ac-Zn Undecylenate (FUNGI-NAIL TOE & FOOT EX) Apply 1 application topically daily as needed (for nail).     . rivaroxaban (XARELTO) 20 MG TABS tablet Take 1 tablet (20 mg total) by mouth daily with supper. (Patient not taking: Reported on 03/22/2017) 30 tablet 1   No current facility-administered medications for this visit.       Physical Exam:   BP 129/79 (BP Location: Right Arm, Patient Position: Sitting, Cuff Size: Large)   Pulse 63   Resp 16   Ht 5\' 9"  (1.753 m)   Wt 224 lb (101.6 kg)   SpO2 96% Comment: ON RA  BMI 33.08 kg/m   General:  Well-appearing  Chest:   Clear to auscultation  CV:   Regular rate and rhythm with holosystolic murmur  Incisions:  n/a  Abdomen:  Soft nontender  Extremities:  Warm and well-perfused  Diagnostic Tests:  Transthoracic Echocardiography  Patient: Wayburn, Shaler MR #: [id number] Study Date: 02/15/2017 Gender: M Age: 73 Height: 175.3 cm Weight: 102.4 kg BSA: 2.27 m^2 Pt. Status: Room:  ATTENDING Dorris Carnes, M.D. ORDERING Darylene Price, M.D. REFERRING Darylene Price, M.D. PERFORMING [facility], Outpatient SONOGRAPHER [facility],  RDCS  cc:  ------------------------------------------------------------------- LV EF: 60% - 65%  ------------------------------------------------------------------- Indications: Mitral Regurgitation (I34.0).  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 There is severe, posterolaterally directed mitral regurgitation which swirls into the left atrial appendage. Right Lung Cancer status post Lobectomy (April, 2018), Obstructive Sleep Apnea, Obesity. Dyspnea and murmur. Coronary artery disease. Risk factors: Family history of coronary artery disease. Hypertension. Diabetes mellitus. Dyslipidemia.  ------------------------------------------------------------------- Study Conclusions  - Left ventricle: The cavity size was normal. Wall thickness was normal. Systolic function was normal. The estimated ejection fraction was in the range of 60% to 65%. - Aortic valve: AV is thickened, calcified with minimally restricted motion. - Mitral valve: Prolapse fo the posterior mitral leaflet. MR is at least moderate in intensity Calcified annulus. Mildly thickened leaflets . - Left atrium: The atrium was mildly dilated.  ------------------------------------------------------------------- Study data: Comparison was made to the study of 07/17/2016. Study status: Routine. Procedure: Transthoracic echocardiography. Image quality was adequate. Transthoracic echocardiography. M-mode, complete 2D, 3D, spectral Doppler, and color Doppler. Birthdate: Patient birthdate:  Feb 19, 1951. Age: Patient is 66 yr old. Sex: Gender: male. BMI: 33.3 kg/m^2. Blood pressure: 139/86 Patient status: Outpatient. Study date: Study date: 02/15/2017. Study time: 08:34 AM. Location: [facility]  Site [facility]  -------------------------------------------------------------------  ------------------------------------------------------------------- Left ventricle: The cavity size was normal. Wall thickness was normal. Systolic function was normal. The estimated ejection fraction was in the range of 60% to 65%.  ------------------------------------------------------------------- Aortic valve: AV is thickened, calcified with minimally restricted motion. Mildly thickened, mildly calcified leaflets. Doppler: There was no regurgitation. VTI ratio of LVOT to aortic valve: 0.45. Valve area (VTI): 1.41 cm^2. Indexed valve area (VTI): 0.62 cm^2/m^2. Peak velocity ratio of LVOT to aortic valve: 0.41. Valve area (Vmax): 1.29 cm^2. Indexed valve area (Vmax): 0.57 cm^2/m^2. Mean velocity ratio of LVOT to aortic valve: 0.42. Valve area (Vmean): 1.32 cm^2. Indexed valve area (Vmean): 0.58 cm^2/m^2. Mean gradient (S): 8 mm Hg. Peak gradient (S): 14 mm Hg.  ------------------------------------------------------------------- Mitral valve: Prolapse fo the posterior mitral leaflet. MR is at least moderate in intensity Calcified annulus. Mildly thickened leaflets . Doppler: Peak gradient (D): 5 mm Hg.  ------------------------------------------------------------------- Left atrium: The atrium was mildly dilated.  ------------------------------------------------------------------- Right ventricle: The cavity size was normal. Wall thickness was normal. Systolic function was normal.  ------------------------------------------------------------------- Pulmonic valve: Structurally normal valve. Cusp separation was normal. Doppler: Transvalvular velocity was within the normal range. There was no regurgitation.  ------------------------------------------------------------------- Tricuspid valve: Structurally normal valve. Leaflet separation was normal. Doppler:  Transvalvular velocity was within the normal range. There was mild regurgitation.  ------------------------------------------------------------------- Right atrium: The atrium was normal in size.  ------------------------------------------------------------------- Pericardium: There was no pericardial effusion.  ------------------------------------------------------------------- Systemic veins: Inferior vena cava: The vessel was dilated. The respirophasic diameter changes were blunted (< 50%), consistent with elevated central venous pressure.  ------------------------------------------------------------------- Measurements  Left ventricle Value Reference LV ID, ED, PLAX chordal (L) 41.5 mm 43 - 52 LV ID, ES, PLAX chordal 29.6 mm 23 - 38 LV fx shortening, PLAX chordal 29 % >=29 LV PW thickness, ED 13.6 mm ---------- IVS/LV PW ratio, ED 0.82 <=1.3 Stroke volume, 2D 58 ml ---------- Stroke volume/bsa, 2D 26 ml/m^2 ---------- LV e&', lateral 16.9 cm/s ---------- LV E/e&', lateral 6.51 ---------- LV e&', medial 9.46 cm/s ---------- LV E/e&', medial 11.63 ---------- LV e&', average 13.18 cm/s ---------- LV E/e&', average 8.35 ----------  Ventricular septum Value Reference IVS thickness, ED 11.2 mm ----------  LVOT Value Reference LVOT ID, S 20 mm ---------- LVOT area 3.14 cm^2  ---------- LVOT peak velocity, S 75.5 cm/s ---------- LVOT mean velocity, S 52.9 cm/s ---------- LVOT VTI, S 18.4 cm ----------  Aortic valve Value Reference Aortic valve peak velocity, S 184 cm/s ---------- Aortic valve mean velocity, S 126 cm/s ---------- Aortic valve VTI, S 41 cm ---------- Aortic mean gradient, S 8 mm Hg ---------- Aortic peak gradient, S 14 mm Hg ---------- VTI ratio, LVOT/AV 0.45 ---------- Aortic valve area, VTI 1.41 cm^2 ---------- Aortic valve area/bsa, VTI 0.62 cm^2/m^2 ---------- Velocity ratio, peak, LVOT/AV 0.41 ---------- Aortic valve area, peak velocity 1.29 cm^2 ---------- Aortic valve area/bsa, peak 0.57 cm^2/m^2 ---------- velocity Velocity ratio, mean, LVOT/AV 0.42 ---------- Aortic valve area, mean velocity 1.32 cm^2 ---------- Aortic valve area/bsa, mean 0.58 cm^2/m^2 ---------- velocity  Aorta Value Reference Aortic root ID, ED 32 mm ---------- Ascending aorta ID, A-P, S 35 mm ----------  Left atrium Value Reference LA ID, A-P, ES 53 mm ---------- LA ID/bsa, A-P (H) 2.34 cm/m^2 <=2.2 LA volume, S 59.9 ml ---------- LA volume/bsa, S  26.4 ml/m^2 ---------- LA volume, ES, 1-p A4C 80.4 ml ---------- LA volume/bsa, ES, 1-p A4C 35.5  ml/m^2 ---------- LA volume, ES, 1-p A2C 41.7 ml ---------- LA volume/bsa, ES, 1-p A2C 18.4 ml/m^2 ----------  Mitral valve Value Reference Mitral E-wave peak velocity 110 cm/s ---------- Mitral A-wave peak velocity 56.9 cm/s ---------- Mitral deceleration time (H) 306 ms 150 - 230 Mitral peak gradient, D 5 mm Hg ---------- Mitral E/A ratio, peak 1.9 ----------  Tricuspid valve Value Reference Tricuspid regurg peak velocity 316 cm/s ---------- Tricuspid peak RV-RA gradient 40 mm Hg ----------  Right atrium Value Reference RA ID, S-I, ES, A4C (H) 51.7 mm 34 - 49 RA area, ES, A4C 14.7 cm^2 8.3 - 19.5 RA volume, ES, A/L 34.2 ml ---------- RA volume/bsa, ES, A/L 15.1 ml/m^2 ----------  Right ventricle Value Reference TAPSE 17.8 mm ---------- RV s&', lateral, S 13.8 cm/s ----------  Legend: (L) and (H) mark values outside specified reference range.  ------------------------------------------------------------------- Prepared and Electronically Authenticated by  Dorris Carnes, M.D. 2018-10-08T16:40:09     Pulmonary Function Tests  Baseline                                                                      Post-bronchodilator  FVC                 2.19 L  (50% predicted)          FVC                 2.28 L  (52% predicted) FEV1               1.58 L  (49% predicted)          FEV1               1.76 L  (54% predicted) FEF25-75        1.06 L  (42% predicted)           FEF25-75        1.61 L  (64% predicted)  TLC                 6.11 L  (89% predicted) RV                   3.78 L  (160% predicted) DLCO              72% predicted    Martinique, Peter M, MD (Primary)    Procedures   Intravascular Pressure Wire/FFR Study  Right/Left Heart Cath and Coronary Angiography  Conclusion     Prox LAD to Mid LAD lesion, 30 %stenosed.  Mid LAD to Dist LAD lesion, 65 %stenosed.  Prox RCA to Mid RCA lesion, 15 %stenosed.  The left ventricular systolic function is normal.  LV end diastolic pressure is normal.  The left ventricular ejection fraction is 55-65% by visual estimate.  LV end diastolic pressure is normal.   1. Borderline single vessel obstructive CAD involving the mid LAD. FFR 0.81 2. Normal LV function EF 65% 3. Normal right heart and LV filling pressures 4. Normal Cardiac output  Plan: will refer to CT surgery for consideration of MV repair. The stenosis in the LAD will be discussed. He  is asymptomatic and I would favor treating it medically. If he develops symptoms in the future it could be treated with PCI.    Indications   Non-rheumatic mitral regurgitation [I34.0 (ICD-10-CM)]  Procedural Details/Technique   Technical Details Indication: 66 yo WM with newly diagnosed murmur. TEE reveals flail P3 segment of the posterior MV leaflet with severe MR. [facility] indicated for surgical evaluation.  Procedural Details: The right wrist was prepped, draped, and anesthetized with 1% lidocaine. Using the modified Seldinger technique a 6 Fr slender sheath was placed in the right radial artery and a 5 French sheath was placed in the right brachial vein. A Swan-Ganz catheter was used for the right heart catheterization. Standard protocol was followed for recording of right heart pressures and sampling of oxygen saturations. Fick cardiac output was calculated. Standard Judkins catheters were used for selective coronary angiography and left  ventriculography. Following the diagnostic procedure we proceeded with FFR analysis of the LAD. The patient was fully anticoagulated with IV heparin. The LCA was engaged with a 5 Fr EBU 3.5 guide. A Comet wire was used to measure FFR using maximal hyperemia with IV adenosine. FFR was 0.91 at rest and 0.81 with IV adenosine. There were no immediate procedural complications. The patient was transferred to the post catheterization recovery area for further monitoring.  Contrast: 110 cc   Estimated blood loss <50 mL.  During this procedure the patient was administered the following to achieve and maintain moderate conscious sedation: Versed 4 mg, Fentanyl 75 mcg, while the patient's heart rate, blood pressure, and oxygen saturation were continuously monitored. The period of conscious sedation was 70 minutes, of which I was present face-to-face 100% of this time.  Complications   Complications documented before study signed (08/11/2016 9:45 AM EDT)    No complications were associated with this study.  Documented by Martinique, Peter M, MD - 08/11/2016 9:41 AM EDT    Coronary Findings   Diagnostic  Dominance: Right  Left Main  Vessel was injected. Vessel is normal in caliber. Vessel is angiographically normal.  Left Anterior Descending  Prox LAD to Mid LAD lesion 30% stenosed  Prox LAD to Mid LAD lesion.  Mid LAD to Dist LAD lesion 65% stenosed  Pressure wire/FFR was performed on the lesion. FFR: 0.81.  Right Coronary Artery  Prox RCA to Mid RCA lesion 15% stenosed  Prox RCA to Mid RCA lesion.  Intervention   No interventions have been documented.  Right Heart   Right Heart Pressures LV EDP is normal.  Wall Motion              Left Heart   Left Ventricle The left ventricular size is normal. The left ventricular systolic function is normal. LV end diastolic pressure is normal. The left ventricular ejection fraction is 55-65% by visual estimate. No regional wall motion abnormalities.    Coronary Diagrams   Diagnostic Diagram       Implants     No implant documentation for this case.  MERGE Images   Show images for Cardiac catheterization   Link to Procedure Log   Procedure Log    Hemo Data    Most Recent Value  Fick Cardiac Output 6.11 L/min  Fick Cardiac Output Index 2.78 (L/min)/BSA  RA A Wave 12 mmHg  RA V Wave 8 mmHg  RA Mean 6 mmHg  RV Systolic Pressure 36 mmHg  RV Diastolic Pressure 4 mmHg  RV EDP 7 mmHg  PA Systolic Pressure 37 mmHg  PA Diastolic Pressure 12 mmHg  PA Mean 25 mmHg  PW A Wave 18 mmHg  PW V Wave 20 mmHg  PW Mean 14 mmHg  AO Systolic Pressure 832 mmHg  AO Diastolic Pressure 64 mmHg  AO Mean 81 mmHg  LV Systolic Pressure 919 mmHg  LV Diastolic Pressure 9 mmHg  LV EDP 19 mmHg  Arterial Occlusion Pressure Extended Systolic Pressure 166 mmHg  Arterial Occlusion Pressure Extended Diastolic Pressure 66 mmHg  Arterial Occlusion Pressure Extended Mean Pressure 87 mmHg  Left Ventricular Apex Extended Systolic Pressure 060 mmHg  Left Ventricular Apex Extended Diastolic Pressure 6 mmHg  Left Ventricular Apex Extended EDP Pressure 17 mmHg  QP/QS 1  TPVR Index 9 HRUI  TSVR Index 29.15 HRUI  PVR SVR Ratio 0.15  TPVR/TSVR Ratio 0.31       Impression:  Patient has stage D severe symptomatic primary mitral regurgitation and single vessel coronary artery disease. He underwent uncomplicated right upper lobectomy a little over 5 months ago for early stage primary lung cancer. Under the circumstances he has done quite well, although he still has significant exertional shortness of breath and he has not gotten back to his baseline functional status prior to surgery. I suspect that this is at least partially due to chronic diastolic congestive heart failure due to severe mitral regurgitation, although his dyspnea is likely multifactorial and affected by decreased vital capacity and pre-existing obesity with obstructive sleep apnea.  Follow-up echocardiogram reveals normal left ventricular systolic function. Pulmonary function tests are notable for a significant drop in the patient's forced vital capacity down from 3.8 L to 2.2 L after lobectomy.  Previous diagnostic cardiac catheterization revealed moderate 70% stenosis of the mid left anterior descending coronary artery with otherwise mild nonobstructive coronary artery disease.  I have personally reviewed the CT angiogram of the chest performed earlier today.  The scan has not yet been read and reported by a radiologist.  To my examination there are no complicating findings in both lung fields appear clear with no sign of progression of the patient's lung cancer.    Plan:  I have again reviewed the indications, risks, and potential benefits of mitral valve repair and coronary artery bypass grafting with the patient and his wife.   The rationale for elective surgery has been explained, including a comparison between surgery and continued medical therapy with close follow-up.  The likelihood of successful and durable valve repair has been discussed with particular reference to the findings of their recent echocardiogram.  Based upon these findings and previous experience, I have quoted them a greater than 95 percent likelihood of successful valve repair.  Expectations for his postoperative convalescence have been discussed.  The patient understands and accepts all potential risks of surgery including but not limited to risk of death, stroke or other neurologic complication, myocardial infarction, congestive heart failure, respiratory failure, renal failure, bleeding requiring transfusion and/or reexploration, arrhythmia, infection or other wound complications, pneumonia, pleural and/or pericardial effusion, pulmonary embolus, aortic dissection or other major vascular complication, or delayed complications related to valve repair or replacement including but not limited to structural  valve deterioration and failure, thrombosis, embolization, endocarditis, or paravalvular leak.  All of their questions have been answered.    I spent in excess of 15 minutes during the conduct of this office consultation and >50% of this time involved direct face-to-face encounter with the patient for counseling and/or coordination of their care.   Valentina Gu. Roxy Manns, MD 03/22/2017 2:40 PM

## 2017-03-22 NOTE — H&P (View-Only) (Signed)
[address]       [place] [address]             [phone]     CARDIOTHORACIC SURGERY OFFICE NOTE  Referring Provider is Martinique, Peter M, MD PCP is [name], MD   HPI:  Patient is a 66 year old male with history of mitral valve prolapse with severe symptomatic primary mitral regurgitation, lung cancer, obstructive sleep apnea on CPAP, type 2 diabetes mellitus, and hyperlipidemia who returns to the office today for follow-up of mitral regurgitation with tentative plans to proceed with mitral valve repair and coronary artery bypass grafting on Wednesday, March 24, 2017. I initially had the opportunity to evaluate the patient in consultation on 08/21/2016.  Prior to that transthoracic and transesophageal echocardiograms revealed the presence of mitral valve prolapse with severe mitral regurgitation and normal left ventricular systolic function. Diagnostic cardiac catheterization revealed a long segment 65% stenosis of the left anterior descending coronary artery with borderline significant hemodynamic parameters on FFR analysis. At the same time the patient was: Incidentally noted to have a 2.6 cm mass in the right upper lobe consistent with primary lung cancer, clinical stage I.  The decision was made to manage the patient's cardiac disease medically over the short-term to facilitate expedient treatment of the newly discovered lung cancer. The patient underwent uncomplicated video assisted right upper lobectomy with mediastinal lymph node dissection by Dr. [name] on 09/02/2016. Final pathology was consistent with moderately differentiated adenocarcinoma with negative surgical margins and no evidence for visceral pleural invasion nor lymphovascular invasion. All lymph nodes were negative, consistent with pathologic stage IA (T1c N0 M0).  The patient was seen in consultation by Dr. Inda Merlin at the cancer center. Adjuvant chemotherapy was not recommended.  The  patient has overall done fairly well, although he was readmitted to the hospital in early June with shortness of breath that was felt to be primarily related to volume overload. CT angiogram at that time revealed what was felt to be a very small pulmonary embolus in the right lower lobe.  Xarelto was started at that time.    I saw him in follow-up in early October at which time we discussed the possibility of proceeding with elective mitral valve repair and coronary artery bypass grafting in the near future.  He underwent follow-up transthoracic echocardiogram and pulmonary function testing, and he has recently been seen in follow-up by [name] and [name].  He returns the office today with tentative plans to proceed with mitral valve repair and coronary artery bypass grafting later this week.  He reports that over the past month he has continued to see gradual improvement in his exercise tolerance.  He still gets short of breath with activity but he has made further progress, and he has now been walking more than 3 miles at a time without significant limitations.  Overall he feels well and he looks forward to proceeding with surgery as previously scheduled.  He stopped taking Xarelto last week.  The remainder of his review of systems is unremarkable.  Current Outpatient Medications  Medication Sig Dispense Refill  . acetaminophen (TYLENOL) 325 MG tablet Take 650 mg by mouth every 6 (six) hours as needed for moderate pain or headache.    Marland Kitchen aspirin EC 81 MG tablet Take 1 tablet (81 mg total) by mouth daily. 90 tablet 3  . atorvastatin (LIPITOR) 20 MG tablet Take 1 tablet (20 mg total) by mouth daily. 90 tablet  3  . furosemide (LASIX) 40 MG tablet TAKE 1 TABLET BY MOUTH EVERY DAY (Patient taking differently: TAKE 40 MG BY MOUTH EVERY DAY) 90 tablet 3  . losartan (COZAAR) 25 MG tablet Take 1 tablet (25 mg total) by mouth daily. 30 tablet 6  . Multiple Vitamin (MULTIVITAMIN) tablet Take 1 tablet by mouth  daily.    . Omega-3 Fatty Acids (FISH OIL) 500 MG CAPS Take 500 mg by mouth daily.     . potassium chloride SA (K-DUR,KLOR-CON) 20 MEQ tablet Take 1 tablet (20 mEq total) by mouth daily. 30 tablet 1  . psyllium (METAMUCIL) 58.6 % packet Take 1 packet by mouth daily.    . sodium chloride (OCEAN) 0.65 % SOLN nasal spray Place 2 sprays into both nostrils at bedtime.    Bethann Humble Sulfate (ALLERGY RELIEF EYE DROPS OP) Apply 2 drops to eye as needed (for allergies).     . traMADol (ULTRAM) 50 MG tablet Take 50 mg by mouth every 4-6 hours PRN severe pain. (Patient taking differently: Take 50 mg by mouth every 4 (four) hours as needed for moderate pain. ) 30 tablet 0  . Undecylenic Ac-Zn Undecylenate (FUNGI-NAIL TOE & FOOT EX) Apply 1 application topically daily as needed (for nail).     . rivaroxaban (XARELTO) 20 MG TABS tablet Take 1 tablet (20 mg total) by mouth daily with supper. (Patient not taking: Reported on 03/22/2017) 30 tablet 1   No current facility-administered medications for this visit.       Physical Exam:   BP 129/79 (BP Location: Right Arm, Patient Position: Sitting, Cuff Size: Large)   Pulse 63   Resp 16   Ht 5\' 9"  (1.753 m)   Wt 224 lb (101.6 kg)   SpO2 96% Comment: ON RA  BMI 33.08 kg/m   General:  Well-appearing  Chest:   Clear to auscultation  CV:   Regular rate and rhythm with holosystolic murmur  Incisions:  n/a  Abdomen:  Soft nontender  Extremities:  Warm and well-perfused  Diagnostic Tests:  Transthoracic Echocardiography  Patient: James [name], James [name] MR #: [id number] Study Date: 02/15/2017 Gender: M Age: 12 Height: 175.3 cm Weight: 102.4 kg BSA: 2.27 m^2 Pt. Status: Room:  ATTENDING Dorris Carnes, M.D. ORDERING Darylene Price, M.D. REFERRING Darylene Price, M.D. PERFORMING [facility], Outpatient SONOGRAPHER [facility],  [facility]  cc:  ------------------------------------------------------------------- LV EF: 60% - 65%  ------------------------------------------------------------------- Indications: Mitral Regurgitation (I34.0).  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 There is severe, posterolaterally directed mitral regurgitation which swirls into the left atrial appendage. Right Lung Cancer status post Lobectomy (April, 2018), Obstructive Sleep Apnea, Obesity. Dyspnea and murmur. Coronary artery disease. Risk factors: Family history of coronary artery disease. Hypertension. Diabetes mellitus. Dyslipidemia.  ------------------------------------------------------------------- Study Conclusions  - Left ventricle: The cavity size was normal. Wall thickness was normal. Systolic function was normal. The estimated ejection fraction was in the range of 60% to 65%. - Aortic valve: AV is thickened, calcified with minimally restricted motion. - Mitral valve: Prolapse fo the posterior mitral leaflet. MR is at least moderate in intensity Calcified annulus. Mildly thickened leaflets . - Left atrium: The atrium was mildly dilated.  ------------------------------------------------------------------- Study data: Comparison was made to the study of 07/17/2016. Study status: Routine. Procedure: Transthoracic echocardiography. Image quality was adequate. Transthoracic echocardiography. M-mode, complete 2D, 3D, spectral Doppler, and color Doppler. Birthdate: Patient birthdate:  09/19/1950. Age: Patient is 66 yr old. Sex: Gender: male. BMI: 33.3 kg/m^2. Blood pressure: 139/86 Patient status: Outpatient. Study date: Study date: 02/15/2017. Study time: 08:34 AM. Location: [facility]  Site [facility]  -------------------------------------------------------------------  ------------------------------------------------------------------- Left ventricle: The cavity size was normal. Wall thickness was normal. Systolic function was normal. The estimated ejection fraction was in the range of 60% to 65%.  ------------------------------------------------------------------- Aortic valve: AV is thickened, calcified with minimally restricted motion. Mildly thickened, mildly calcified leaflets. Doppler: There was no regurgitation. VTI ratio of LVOT to aortic valve: 0.45. Valve area (VTI): 1.41 cm^2. Indexed valve area (VTI): 0.62 cm^2/m^2. Peak velocity ratio of LVOT to aortic valve: 0.41. Valve area (Vmax): 1.29 cm^2. Indexed valve area (Vmax): 0.57 cm^2/m^2. Mean velocity ratio of LVOT to aortic valve: 0.42. Valve area (Vmean): 1.32 cm^2. Indexed valve area (Vmean): 0.58 cm^2/m^2. Mean gradient (S): 8 mm Hg. Peak gradient (S): 14 mm Hg.  ------------------------------------------------------------------- Mitral valve: Prolapse fo the posterior mitral leaflet. MR is at least moderate in intensity Calcified annulus. Mildly thickened leaflets . Doppler: Peak gradient (D): 5 mm Hg.  ------------------------------------------------------------------- Left atrium: The atrium was mildly dilated.  ------------------------------------------------------------------- Right ventricle: The cavity size was normal. Wall thickness was normal. Systolic function was normal.  ------------------------------------------------------------------- Pulmonic valve: Structurally normal valve. Cusp separation was normal. Doppler: Transvalvular velocity was within the normal range. There was no regurgitation.  ------------------------------------------------------------------- Tricuspid valve: Structurally normal valve. Leaflet separation was normal. Doppler:  Transvalvular velocity was within the normal range. There was mild regurgitation.  ------------------------------------------------------------------- Right atrium: The atrium was normal in size.  ------------------------------------------------------------------- Pericardium: There was no pericardial effusion.  ------------------------------------------------------------------- Systemic veins: Inferior vena cava: The vessel was dilated. The respirophasic diameter changes were blunted (< 50%), consistent with elevated central venous pressure.  ------------------------------------------------------------------- Measurements  Left ventricle Value Reference LV ID, ED, PLAX chordal (L) 41.5 mm 43 - 52 LV ID, ES, PLAX chordal 29.6 mm 23 - 38 LV fx shortening, PLAX chordal 29 % >=29 LV PW thickness, ED 13.6 mm ---------- IVS/LV PW ratio, ED 0.82 <=1.3 Stroke volume, 2D 58 ml ---------- Stroke volume/bsa, 2D 26 ml/m^2 ---------- LV e&', lateral 16.9 cm/s ---------- LV E/e&', lateral 6.51 ---------- LV e&', medial 9.46 cm/s ---------- LV E/e&', medial 11.63 ---------- LV e&', average 13.18 cm/s ---------- LV E/e&', average 8.35 ----------  Ventricular septum Value Reference IVS thickness, ED 11.2 mm ----------  LVOT Value Reference LVOT ID, S 20 mm ---------- LVOT area 3.14 cm^2  ---------- LVOT peak velocity, S 75.5 cm/s ---------- LVOT mean velocity, S 52.9 cm/s ---------- LVOT VTI, S 18.4 cm ----------  Aortic valve Value Reference Aortic valve peak velocity, S 184 cm/s ---------- Aortic valve mean velocity, S 126 cm/s ---------- Aortic valve VTI, S 41 cm ---------- Aortic mean gradient, S 8 mm Hg ---------- Aortic peak gradient, S 14 mm Hg ---------- VTI ratio, LVOT/AV 0.45 ---------- Aortic valve area, VTI 1.41 cm^2 ---------- Aortic valve area/bsa, VTI 0.62 cm^2/m^2 ---------- Velocity ratio, peak, LVOT/AV 0.41 ---------- Aortic valve area, peak velocity 1.29 cm^2 ---------- Aortic valve area/bsa, peak 0.57 cm^2/m^2 ---------- velocity Velocity ratio, mean, LVOT/AV 0.42 ---------- Aortic valve area, mean velocity 1.32 cm^2 ---------- Aortic valve area/bsa, mean 0.58 cm^2/m^2 ---------- velocity  Aorta Value Reference Aortic root ID, ED 32 mm ---------- Ascending aorta ID, A-P, S 35 mm ----------  Left atrium Value Reference LA ID, A-P, ES 53 mm ---------- LA ID/bsa, A-P (H) 2.34 cm/m^2 <=2.2 LA volume, S 59.9 ml ---------- LA volume/bsa, S  26.4 ml/m^2 ---------- LA volume, ES, 1-p A4C 80.4 ml ---------- LA volume/bsa, ES, 1-p A4C 35.5  ml/m^2 ---------- LA volume, ES, 1-p A2C 41.7 ml ---------- LA volume/bsa, ES, 1-p A2C 18.4 ml/m^2 ----------  Mitral valve Value Reference Mitral E-wave peak velocity 110 cm/s ---------- Mitral A-wave peak velocity 56.9 cm/s ---------- Mitral deceleration time (H) 306 ms 150 - 230 Mitral peak gradient, D 5 mm Hg ---------- Mitral E/A ratio, peak 1.9 ----------  Tricuspid valve Value Reference Tricuspid regurg peak velocity 316 cm/s ---------- Tricuspid peak RV-RA gradient 40 mm Hg ----------  Right atrium Value Reference RA ID, S-I, ES, A4C (H) 51.7 mm 34 - 49 RA area, ES, A4C 14.7 cm^2 8.3 - 19.5 RA volume, ES, A/L 34.2 ml ---------- RA volume/bsa, ES, A/L 15.1 ml/m^2 ----------  Right ventricle Value Reference TAPSE 17.8 mm ---------- RV s&', lateral, S 13.8 cm/s ----------  Legend: (L) and (H) mark values outside specified reference range.  ------------------------------------------------------------------- Prepared and Electronically Authenticated by  Dorris Carnes, M.D. 2018-10-08T16:40:09     Pulmonary Function Tests  Baseline                                                                      Post-bronchodilator  FVC                 2.19 L  (50% predicted)          FVC                 2.28 L  (52% predicted) FEV1               1.58 L  (49% predicted)          FEV1               1.76 L  (54% predicted) FEF25-75        1.06 L  (42% predicted)           FEF25-75        1.61 L  (64% predicted)  TLC                 6.11 L  (89% predicted) RV                   3.78 L  (160% predicted) DLCO              72% predicted    Martinique, Peter M, MD (Primary)    Procedures   Intravascular Pressure Wire/FFR Study  Right/Left Heart Cath and Coronary Angiography  Conclusion     Prox LAD to Mid LAD lesion, 30 %stenosed.  Mid LAD to Dist LAD lesion, 65 %stenosed.  Prox RCA to Mid RCA lesion, 15 %stenosed.  The left ventricular systolic function is normal.  LV end diastolic pressure is normal.  The left ventricular ejection fraction is 55-65% by visual estimate.  LV end diastolic pressure is normal.   1. Borderline single vessel obstructive CAD involving the mid LAD. FFR 0.81 2. Normal LV function EF 65% 3. Normal right heart and LV filling pressures 4. Normal Cardiac output  Plan: will refer to CT surgery for consideration of MV repair. The stenosis in the LAD will be discussed. He  is asymptomatic and I would favor treating it medically. If he develops symptoms in the future it could be treated with PCI.    Indications   Non-rheumatic mitral regurgitation [I34.0 (ICD-10-CM)]  Procedural Details/Technique   Technical Details Indication: 66 yo WM with newly diagnosed murmur. TEE reveals flail P3 segment of the posterior MV leaflet with severe MR. [facility] indicated for surgical evaluation.  Procedural Details: The right wrist was prepped, draped, and anesthetized with 1% lidocaine. Using the modified Seldinger technique a 6 Fr slender sheath was placed in the right radial artery and a 5 French sheath was placed in the right brachial vein. A Swan-Ganz catheter was used for the right heart catheterization. Standard protocol was followed for recording of right heart pressures and sampling of oxygen saturations. Fick cardiac output was calculated. Standard Judkins catheters were used for selective coronary angiography and left  ventriculography. Following the diagnostic procedure we proceeded with FFR analysis of the LAD. The patient was fully anticoagulated with IV heparin. The LCA was engaged with a 5 Fr EBU 3.5 guide. A Comet wire was used to measure FFR using maximal hyperemia with IV adenosine. FFR was 0.91 at rest and 0.81 with IV adenosine. There were no immediate procedural complications. The patient was transferred to the post catheterization recovery area for further monitoring.  Contrast: 110 cc   Estimated blood loss <50 mL.  During this procedure the patient was administered the following to achieve and maintain moderate conscious sedation: Versed 4 mg, Fentanyl 75 mcg, while the patient's heart rate, blood pressure, and oxygen saturation were continuously monitored. The period of conscious sedation was 70 minutes, of which I was present face-to-face 100% of this time.  Complications   Complications documented before study signed (08/11/2016 9:45 AM EDT)    No complications were associated with this study.  Documented by Martinique, Peter M, MD - 08/11/2016 9:41 AM EDT    Coronary Findings   Diagnostic  Dominance: Right  Left Main  Vessel was injected. Vessel is normal in caliber. Vessel is angiographically normal.  Left Anterior Descending  Prox LAD to Mid LAD lesion 30% stenosed  Prox LAD to Mid LAD lesion.  Mid LAD to Dist LAD lesion 65% stenosed  Pressure wire/FFR was performed on the lesion. FFR: 0.81.  Right Coronary Artery  Prox RCA to Mid RCA lesion 15% stenosed  Prox RCA to Mid RCA lesion.  Intervention   No interventions have been documented.  Right Heart   Right Heart Pressures LV EDP is normal.  Wall Motion              Left Heart   Left Ventricle The left ventricular size is normal. The left ventricular systolic function is normal. LV end diastolic pressure is normal. The left ventricular ejection fraction is 55-65% by visual estimate. No regional wall motion abnormalities.    Coronary Diagrams   Diagnostic Diagram       Implants     No implant documentation for this case.  MERGE Images   Show images for Cardiac catheterization   Link to Procedure Log   Procedure Log    Hemo Data    Most Recent Value  Fick Cardiac Output 6.11 L/min  Fick Cardiac Output Index 2.78 (L/min)/BSA  RA A Wave 12 mmHg  RA V Wave 8 mmHg  RA Mean 6 mmHg  RV Systolic Pressure 36 mmHg  RV Diastolic Pressure 4 mmHg  RV EDP 7 mmHg  PA Systolic Pressure 37 mmHg  PA Diastolic Pressure 12 mmHg  PA Mean 25 mmHg  PW A Wave 18 mmHg  PW V Wave 20 mmHg  PW Mean 14 mmHg  AO Systolic Pressure 976 mmHg  AO Diastolic Pressure 64 mmHg  AO Mean 81 mmHg  LV Systolic Pressure 734 mmHg  LV Diastolic Pressure 9 mmHg  LV EDP 19 mmHg  Arterial Occlusion Pressure Extended Systolic Pressure 193 mmHg  Arterial Occlusion Pressure Extended Diastolic Pressure 66 mmHg  Arterial Occlusion Pressure Extended Mean Pressure 87 mmHg  Left Ventricular Apex Extended Systolic Pressure 790 mmHg  Left Ventricular Apex Extended Diastolic Pressure 6 mmHg  Left Ventricular Apex Extended EDP Pressure 17 mmHg  QP/QS 1  TPVR Index 9 HRUI  TSVR Index 29.15 HRUI  PVR SVR Ratio 0.15  TPVR/TSVR Ratio 0.31       Impression:  Patient has stage D severe symptomatic primary mitral regurgitation and single vessel coronary artery disease. He underwent uncomplicated right upper lobectomy a little over 5 months ago for early stage primary lung cancer. Under the circumstances he has done quite well, although he still has significant exertional shortness of breath and he has not gotten back to his baseline functional status prior to surgery. I suspect that this is at least partially due to chronic diastolic congestive heart failure due to severe mitral regurgitation, although his dyspnea is likely multifactorial and affected by decreased vital capacity and pre-existing obesity with obstructive sleep apnea.  Follow-up echocardiogram reveals normal left ventricular systolic function. Pulmonary function tests are notable for a significant drop in the patient's forced vital capacity down from 3.8 L to 2.2 L after lobectomy.  Previous diagnostic cardiac catheterization revealed moderate 70% stenosis of the mid left anterior descending coronary artery with otherwise mild nonobstructive coronary artery disease.  I have personally reviewed the CT angiogram of the chest performed earlier today.  The scan has not yet been read and reported by a radiologist.  To my examination there are no complicating findings in both lung fields appear clear with no sign of progression of the patient's lung cancer.    Plan:  I have again reviewed the indications, risks, and potential benefits of mitral valve repair and coronary artery bypass grafting with the patient and his wife.   The rationale for elective surgery has been explained, including a comparison between surgery and continued medical therapy with close follow-up.  The likelihood of successful and durable valve repair has been discussed with particular reference to the findings of their recent echocardiogram.  Based upon these findings and previous experience, I have quoted them a greater than 95 percent likelihood of successful valve repair.  Expectations for his postoperative convalescence have been discussed.  The patient understands and accepts all potential risks of surgery including but not limited to risk of death, stroke or other neurologic complication, myocardial infarction, congestive heart failure, respiratory failure, renal failure, bleeding requiring transfusion and/or reexploration, arrhythmia, infection or other wound complications, pneumonia, pleural and/or pericardial effusion, pulmonary embolus, aortic dissection or other major vascular complication, or delayed complications related to valve repair or replacement including but not limited to structural  valve deterioration and failure, thrombosis, embolization, endocarditis, or paravalvular leak.  All of their questions have been answered.    I spent in excess of 15 minutes during the conduct of this office consultation and >50% of this time involved direct face-to-face encounter with the patient for counseling and/or coordination of their care.   Valentina Gu. Roxy Manns, MD 03/22/2017 2:40 PM

## 2017-03-23 ENCOUNTER — Encounter (HOSPITAL_COMMUNITY): Payer: Self-pay | Admitting: Thoracic Surgery (Cardiothoracic Vascular Surgery)

## 2017-03-23 DIAGNOSIS — I5032 Chronic diastolic (congestive) heart failure: Secondary | ICD-10-CM | POA: Diagnosis present

## 2017-03-23 DIAGNOSIS — I2510 Atherosclerotic heart disease of native coronary artery without angina pectoris: Secondary | ICD-10-CM | POA: Diagnosis present

## 2017-03-23 MED ORDER — MILRINONE LACTATE IN DEXTROSE 20-5 MG/100ML-% IV SOLN
0.1250 ug/kg/min | INTRAVENOUS | Status: DC
Start: 2017-03-24 — End: 2017-03-24
  Filled 2017-03-23: qty 100

## 2017-03-23 MED ORDER — SODIUM CHLORIDE 0.9 % IV SOLN
INTRAVENOUS | Status: AC
  Administered 2017-03-24: 2.1 [IU]/h via INTRAVENOUS
  Filled 2017-03-23: qty 1

## 2017-03-23 MED ORDER — DEXTROSE 5 % IV SOLN
750.0000 mg | INTRAVENOUS | Status: DC
  Filled 2017-03-23: qty 750

## 2017-03-23 MED ORDER — CHLORHEXIDINE GLUCONATE 0.12 % MT SOLN
15.0000 mL | Freq: Once | OROMUCOSAL | Status: AC
  Administered 2017-03-24: 15 mL via OROMUCOSAL
  Filled 2017-03-23: qty 15

## 2017-03-23 MED ORDER — DEXMEDETOMIDINE HCL IN NACL 400 MCG/100ML IV SOLN
0.1000 ug/kg/h | INTRAVENOUS | Status: AC
  Administered 2017-03-24: .5 ug/kg/h via INTRAVENOUS
  Filled 2017-03-23: qty 100

## 2017-03-23 MED ORDER — EPINEPHRINE PF 1 MG/ML IJ SOLN
0.0000 ug/min | INTRAVENOUS | Status: DC
  Filled 2017-03-23: qty 4

## 2017-03-23 MED ORDER — TRANEXAMIC ACID (OHS) BOLUS VIA INFUSION
15.0000 mg/kg | INTRAVENOUS | Status: AC
  Administered 2017-03-24: 1524 mg via INTRAVENOUS
  Filled 2017-03-23: qty 1524

## 2017-03-23 MED ORDER — POTASSIUM CHLORIDE 2 MEQ/ML IV SOLN
80.0000 meq | INTRAVENOUS | Status: DC
  Filled 2017-03-23: qty 40

## 2017-03-23 MED ORDER — KENNESTONE BLOOD CARDIOPLEGIA VIAL
13.0000 mL | Freq: Once | Status: DC
  Filled 2017-03-23: qty 13

## 2017-03-23 MED ORDER — VANCOMYCIN HCL 1000 MG IV SOLR
INTRAVENOUS | Status: AC
  Administered 2017-03-24: 1000 mL
  Filled 2017-03-23: qty 1000

## 2017-03-23 MED ORDER — SODIUM CHLORIDE 0.9 % IV SOLN
INTRAVENOUS | Status: DC
  Filled 2017-03-23: qty 30

## 2017-03-23 MED ORDER — DEXTROSE 5 % IV SOLN
1.5000 g | INTRAVENOUS | Status: AC
  Administered 2017-03-24: 1.5 g via INTRAVENOUS
  Administered 2017-03-24: .75 g via INTRAVENOUS
  Filled 2017-03-23 (×2): qty 1.5

## 2017-03-23 MED ORDER — NITROGLYCERIN IN D5W 200-5 MCG/ML-% IV SOLN
2.0000 ug/min | INTRAVENOUS | Status: DC
  Filled 2017-03-23: qty 250

## 2017-03-23 MED ORDER — KENNESTONE BLOOD CARDIOPLEGIA (KBC) MANNITOL SYRINGE (20%, 32ML)
32.0000 mL | Freq: Once | INTRAVENOUS | Status: DC
  Filled 2017-03-23: qty 32

## 2017-03-23 MED ORDER — METOPROLOL TARTRATE 12.5 MG HALF TABLET
12.5000 mg | ORAL_TABLET | Freq: Once | ORAL | Status: AC
  Administered 2017-03-24: 12.5 mg via ORAL
  Filled 2017-03-23: qty 1

## 2017-03-23 MED ORDER — DOPAMINE-DEXTROSE 3.2-5 MG/ML-% IV SOLN
0.0000 ug/kg/min | INTRAVENOUS | Status: DC
  Filled 2017-03-23: qty 250

## 2017-03-23 MED ORDER — PLASMA-LYTE 148 IV SOLN
INTRAVENOUS | Status: AC
  Administered 2017-03-24: 500 mL
  Filled 2017-03-23: qty 2.5

## 2017-03-23 MED ORDER — VANCOMYCIN HCL 10 G IV SOLR
1500.0000 mg | INTRAVENOUS | Status: AC
  Administered 2017-03-24: 1500 mg via INTRAVENOUS
  Filled 2017-03-23: qty 1500

## 2017-03-23 MED ORDER — SODIUM CHLORIDE 0.9 % IV SOLN
30.0000 ug/min | INTRAVENOUS | Status: AC
  Administered 2017-03-24: 50 ug/min via INTRAVENOUS
  Filled 2017-03-23: qty 2

## 2017-03-23 MED ORDER — TRANEXAMIC ACID 1000 MG/10ML IV SOLN
1.5000 mg/kg/h | INTRAVENOUS | Status: AC
  Administered 2017-03-24: 1.5 mg/kg/h via INTRAVENOUS
  Filled 2017-03-23: qty 25

## 2017-03-23 MED ORDER — TRANEXAMIC ACID (OHS) PUMP PRIME SOLUTION
2.0000 mg/kg | INTRAVENOUS | Status: DC
  Filled 2017-03-23: qty 2.03

## 2017-03-23 MED ORDER — MAGNESIUM SULFATE 50 % IJ SOLN
40.0000 meq | INTRAMUSCULAR | Status: DC
  Filled 2017-03-23: qty 9.85

## 2017-03-24 ENCOUNTER — Encounter (HOSPITAL_COMMUNITY)
Admission: RE | Disposition: A | Discharge status: Home / Self Care | Payer: Self-pay | Source: Ambulatory Visit | Attending: Thoracic Surgery (Cardiothoracic Vascular Surgery)

## 2017-03-24 ENCOUNTER — Inpatient Hospital Stay (HOSPITAL_COMMUNITY): Payer: Medicare Other

## 2017-03-24 ENCOUNTER — Encounter (HOSPITAL_COMMUNITY): Payer: Self-pay

## 2017-03-24 ENCOUNTER — Inpatient Hospital Stay (HOSPITAL_COMMUNITY)
Admission: RE | Admit: 2017-03-24 | Discharge: 2017-03-30 | DRG: 220 | Disposition: A | Discharge status: Home / Self Care | Payer: Medicare Other | Source: Ambulatory Visit | Attending: Thoracic Surgery (Cardiothoracic Vascular Surgery) | Admitting: Thoracic Surgery (Cardiothoracic Vascular Surgery)

## 2017-03-24 ENCOUNTER — Inpatient Hospital Stay (HOSPITAL_COMMUNITY): Payer: Medicare Other | Admitting: Emergency Medicine

## 2017-03-24 ENCOUNTER — Inpatient Hospital Stay (HOSPITAL_COMMUNITY): Payer: Medicare Other | Admitting: Anesthesiology

## 2017-03-24 DIAGNOSIS — R490 Dysphonia: Secondary | ICD-10-CM | POA: Diagnosis present

## 2017-03-24 DIAGNOSIS — Z6833 Body mass index (BMI) 33.0-33.9, adult: Secondary | ICD-10-CM | POA: Diagnosis not present

## 2017-03-24 DIAGNOSIS — R0609 Other forms of dyspnea: Secondary | ICD-10-CM | POA: Diagnosis present

## 2017-03-24 DIAGNOSIS — Z7982 Long term (current) use of aspirin: Secondary | ICD-10-CM

## 2017-03-24 DIAGNOSIS — Z79891 Long term (current) use of opiate analgesic: Secondary | ICD-10-CM | POA: Diagnosis not present

## 2017-03-24 DIAGNOSIS — Z86711 Personal history of pulmonary embolism: Secondary | ICD-10-CM

## 2017-03-24 DIAGNOSIS — E785 Hyperlipidemia, unspecified: Secondary | ICD-10-CM | POA: Diagnosis present

## 2017-03-24 DIAGNOSIS — Z8249 Family history of ischemic heart disease and other diseases of the circulatory system: Secondary | ICD-10-CM | POA: Diagnosis not present

## 2017-03-24 DIAGNOSIS — J9811 Atelectasis: Secondary | ICD-10-CM | POA: Diagnosis not present

## 2017-03-24 DIAGNOSIS — Z902 Acquired absence of lung [part of]: Secondary | ICD-10-CM | POA: Diagnosis not present

## 2017-03-24 DIAGNOSIS — G4733 Obstructive sleep apnea (adult) (pediatric): Secondary | ICD-10-CM | POA: Diagnosis present

## 2017-03-24 DIAGNOSIS — K219 Gastro-esophageal reflux disease without esophagitis: Secondary | ICD-10-CM | POA: Diagnosis present

## 2017-03-24 DIAGNOSIS — Z79899 Other long term (current) drug therapy: Secondary | ICD-10-CM

## 2017-03-24 DIAGNOSIS — I2510 Atherosclerotic heart disease of native coronary artery without angina pectoris: Secondary | ICD-10-CM | POA: Diagnosis present

## 2017-03-24 DIAGNOSIS — I5032 Chronic diastolic (congestive) heart failure: Secondary | ICD-10-CM | POA: Diagnosis present

## 2017-03-24 DIAGNOSIS — E118 Type 2 diabetes mellitus with unspecified complications: Secondary | ICD-10-CM | POA: Diagnosis present

## 2017-03-24 DIAGNOSIS — I340 Nonrheumatic mitral (valve) insufficiency: Principal | ICD-10-CM | POA: Diagnosis present

## 2017-03-24 DIAGNOSIS — M1990 Unspecified osteoarthritis, unspecified site: Secondary | ICD-10-CM | POA: Diagnosis present

## 2017-03-24 DIAGNOSIS — C3491 Malignant neoplasm of unspecified part of right bronchus or lung: Secondary | ICD-10-CM | POA: Diagnosis present

## 2017-03-24 DIAGNOSIS — Z9989 Dependence on other enabling machines and devices: Secondary | ICD-10-CM

## 2017-03-24 DIAGNOSIS — E119 Type 2 diabetes mellitus without complications: Secondary | ICD-10-CM | POA: Diagnosis present

## 2017-03-24 DIAGNOSIS — K5900 Constipation, unspecified: Secondary | ICD-10-CM | POA: Diagnosis present

## 2017-03-24 DIAGNOSIS — D72829 Elevated white blood cell count, unspecified: Secondary | ICD-10-CM | POA: Diagnosis present

## 2017-03-24 DIAGNOSIS — Z951 Presence of aortocoronary bypass graft: Secondary | ICD-10-CM

## 2017-03-24 DIAGNOSIS — D696 Thrombocytopenia, unspecified: Secondary | ICD-10-CM | POA: Diagnosis not present

## 2017-03-24 DIAGNOSIS — E669 Obesity, unspecified: Secondary | ICD-10-CM | POA: Diagnosis present

## 2017-03-24 DIAGNOSIS — Z9889 Other specified postprocedural states: Secondary | ICD-10-CM

## 2017-03-24 DIAGNOSIS — D62 Acute posthemorrhagic anemia: Secondary | ICD-10-CM | POA: Diagnosis not present

## 2017-03-24 DIAGNOSIS — F329 Major depressive disorder, single episode, unspecified: Secondary | ICD-10-CM | POA: Diagnosis present

## 2017-03-24 DIAGNOSIS — I110 Hypertensive heart disease with heart failure: Secondary | ICD-10-CM | POA: Diagnosis present

## 2017-03-24 DIAGNOSIS — K567 Ileus, unspecified: Secondary | ICD-10-CM | POA: Diagnosis not present

## 2017-03-24 DIAGNOSIS — F419 Anxiety disorder, unspecified: Secondary | ICD-10-CM | POA: Diagnosis present

## 2017-03-24 DIAGNOSIS — R0600 Dyspnea, unspecified: Secondary | ICD-10-CM | POA: Diagnosis present

## 2017-03-24 HISTORY — DX: Chronic diastolic (congestive) heart failure: I50.32

## 2017-03-24 HISTORY — DX: Atherosclerotic heart disease of native coronary artery without angina pectoris: I25.10

## 2017-03-24 HISTORY — DX: Presence of aortocoronary bypass graft: Z95.1

## 2017-03-24 HISTORY — DX: Other specified postprocedural states: Z98.890

## 2017-03-24 HISTORY — PX: MITRAL VALVE REPAIR: SHX2039

## 2017-03-24 HISTORY — PX: CORONARY ARTERY BYPASS GRAFT: SHX141

## 2017-03-24 HISTORY — PX: TEE WITHOUT CARDIOVERSION: SHX5443

## 2017-03-24 LAB — POCT I-STAT, CHEM 8
BUN: 21 mg/dL — ABNORMAL HIGH (ref 6–20)
BUN: 16 mg/dL (ref 6–20)
BUN: 18 mg/dL (ref 6–20)
BUN: 18 mg/dL (ref 6–20)
BUN: 17 mg/dL (ref 6–20)
BUN: 16 mg/dL (ref 6–20)
BUN: 15 mg/dL (ref 6–20)
Calcium, Ion: 1.21 mmol/L (ref 1.15–1.40)
Calcium, Ion: 1.07 mmol/L — ABNORMAL LOW (ref 1.15–1.40)
Calcium, Ion: 1.16 mmol/L (ref 1.15–1.40)
Calcium, Ion: 1.12 mmol/L — ABNORMAL LOW (ref 1.15–1.40)
Calcium, Ion: 1.04 mmol/L — ABNORMAL LOW (ref 1.15–1.40)
Calcium, Ion: 1.06 mmol/L — ABNORMAL LOW (ref 1.15–1.40)
Calcium, Ion: 1.12 mmol/L — ABNORMAL LOW (ref 1.15–1.40)
Chloride: 105 mmol/L (ref 101–111)
Chloride: 103 mmol/L (ref 101–111)
Chloride: 106 mmol/L (ref 101–111)
Chloride: 105 mmol/L (ref 101–111)
Chloride: 108 mmol/L (ref 101–111)
Chloride: 107 mmol/L (ref 101–111)
Chloride: 109 mmol/L (ref 101–111)
Creatinine, Ser: 0.9 mg/dL (ref 0.61–1.24)
Creatinine, Ser: 0.7 mg/dL (ref 0.61–1.24)
Creatinine, Ser: 0.7 mg/dL (ref 0.61–1.24)
Creatinine, Ser: 0.7 mg/dL (ref 0.61–1.24)
Creatinine, Ser: 0.8 mg/dL (ref 0.61–1.24)
Creatinine, Ser: 0.7 mg/dL (ref 0.61–1.24)
Creatinine, Ser: 0.8 mg/dL (ref 0.61–1.24)
Glucose, Bld: 167 mg/dL — ABNORMAL HIGH (ref 65–99)
Glucose, Bld: 125 mg/dL — ABNORMAL HIGH (ref 65–99)
Glucose, Bld: 146 mg/dL — ABNORMAL HIGH (ref 65–99)
Glucose, Bld: 134 mg/dL — ABNORMAL HIGH (ref 65–99)
Glucose, Bld: 155 mg/dL — ABNORMAL HIGH (ref 65–99)
Glucose, Bld: 160 mg/dL — ABNORMAL HIGH (ref 65–99)
Glucose, Bld: 160 mg/dL — ABNORMAL HIGH (ref 65–99)
HCT: 42 % (ref 39.0–52.0)
HCT: 32 % — ABNORMAL LOW (ref 39.0–52.0)
HCT: 40 % (ref 39.0–52.0)
HCT: 33 % — ABNORMAL LOW (ref 39.0–52.0)
HCT: 28 % — ABNORMAL LOW (ref 39.0–52.0)
HCT: 32 % — ABNORMAL LOW (ref 39.0–52.0)
HCT: 35 % — ABNORMAL LOW (ref 39.0–52.0)
Hemoglobin: 14.3 g/dL (ref 13.0–17.0)
Hemoglobin: 10.9 g/dL — ABNORMAL LOW (ref 13.0–17.0)
Hemoglobin: 13.6 g/dL (ref 13.0–17.0)
Hemoglobin: 11.2 g/dL — ABNORMAL LOW (ref 13.0–17.0)
Hemoglobin: 9.5 g/dL — ABNORMAL LOW (ref 13.0–17.0)
Hemoglobin: 10.9 g/dL — ABNORMAL LOW (ref 13.0–17.0)
Hemoglobin: 11.9 g/dL — ABNORMAL LOW (ref 13.0–17.0)
Potassium: 4.5 mmol/L (ref 3.5–5.1)
Potassium: 4.1 mmol/L (ref 3.5–5.1)
Potassium: 4.3 mmol/L (ref 3.5–5.1)
Potassium: 4.3 mmol/L (ref 3.5–5.1)
Potassium: 4.8 mmol/L (ref 3.5–5.1)
Potassium: 4.3 mmol/L (ref 3.5–5.1)
Potassium: 4.2 mmol/L (ref 3.5–5.1)
Sodium: 140 mmol/L (ref 135–145)
Sodium: 140 mmol/L (ref 135–145)
Sodium: 141 mmol/L (ref 135–145)
Sodium: 141 mmol/L (ref 135–145)
Sodium: 141 mmol/L (ref 135–145)
Sodium: 141 mmol/L (ref 135–145)
Sodium: 142 mmol/L (ref 135–145)
TCO2: 28 mmol/L (ref 22–32)
TCO2: 28 mmol/L (ref 22–32)
TCO2: 28 mmol/L (ref 22–32)
TCO2: 29 mmol/L (ref 22–32)
TCO2: 26 mmol/L (ref 22–32)
TCO2: 27 mmol/L (ref 22–32)
TCO2: 21 mmol/L — ABNORMAL LOW (ref 22–32)

## 2017-03-24 LAB — POCT I-STAT 3, ART BLOOD GAS (G3+)
Acid-Base Excess: 2 mmol/L (ref 0.0–2.0)
Acid-Base Excess: 2 mmol/L (ref 0.0–2.0)
Acid-base deficit: 3 mmol/L — ABNORMAL HIGH (ref 0.0–2.0)
Acid-base deficit: 4 mmol/L — ABNORMAL HIGH (ref 0.0–2.0)
Acid-base deficit: 3 mmol/L — ABNORMAL HIGH (ref 0.0–2.0)
Bicarbonate: 27.6 mmol/L (ref 20.0–28.0)
Bicarbonate: 27.2 mmol/L (ref 20.0–28.0)
Bicarbonate: 23 mmol/L (ref 20.0–28.0)
Bicarbonate: 21.8 mmol/L (ref 20.0–28.0)
Bicarbonate: 22 mmol/L (ref 20.0–28.0)
O2 Saturation: 100 %
O2 Saturation: 100 %
O2 Saturation: 99 %
O2 Saturation: 99 %
O2 Saturation: 98 %
Patient temperature: 36
Patient temperature: 36.4
Patient temperature: 36.8
TCO2: 29 mmol/L (ref 22–32)
TCO2: 28 mmol/L (ref 22–32)
TCO2: 24 mmol/L (ref 22–32)
TCO2: 23 mmol/L (ref 22–32)
TCO2: 23 mmol/L (ref 22–32)
pCO2 arterial: 47.5 mmHg (ref 32.0–48.0)
pCO2 arterial: 43.3 mmHg (ref 32.0–48.0)
pCO2 arterial: 42.8 mmHg (ref 32.0–48.0)
pCO2 arterial: 38.9 mmHg (ref 32.0–48.0)
pCO2 arterial: 38.1 mmHg (ref 32.0–48.0)
pH, Arterial: 7.372 (ref 7.350–7.450)
pH, Arterial: 7.406 (ref 7.350–7.450)
pH, Arterial: 7.334 — ABNORMAL LOW (ref 7.350–7.450)
pH, Arterial: 7.353 (ref 7.350–7.450)
pH, Arterial: 7.369 (ref 7.350–7.450)
pO2, Arterial: 430 mmHg — ABNORMAL HIGH (ref 83.0–108.0)
pO2, Arterial: 460 mmHg — ABNORMAL HIGH (ref 83.0–108.0)
pO2, Arterial: 149 mmHg — ABNORMAL HIGH (ref 83.0–108.0)
pO2, Arterial: 135 mmHg — ABNORMAL HIGH (ref 83.0–108.0)
pO2, Arterial: 105 mmHg (ref 83.0–108.0)

## 2017-03-24 LAB — APTT: aPTT: 31 seconds (ref 24–36)

## 2017-03-24 LAB — GLUCOSE, CAPILLARY
Glucose-Capillary: 137 mg/dL — ABNORMAL HIGH (ref 65–99)
Glucose-Capillary: 140 mg/dL — ABNORMAL HIGH (ref 65–99)
Glucose-Capillary: 141 mg/dL — ABNORMAL HIGH (ref 65–99)
Glucose-Capillary: 141 mg/dL — ABNORMAL HIGH (ref 65–99)
Glucose-Capillary: 145 mg/dL — ABNORMAL HIGH (ref 65–99)
Glucose-Capillary: 147 mg/dL — ABNORMAL HIGH (ref 65–99)

## 2017-03-24 LAB — CBC
HCT: 40.3 % (ref 39.0–52.0)
HCT: 37.6 % — ABNORMAL LOW (ref 39.0–52.0)
Hemoglobin: 13.3 g/dL (ref 13.0–17.0)
Hemoglobin: 12.5 g/dL — ABNORMAL LOW (ref 13.0–17.0)
MCH: 30.1 pg (ref 26.0–34.0)
MCH: 29.8 pg (ref 26.0–34.0)
MCHC: 33 g/dL (ref 30.0–36.0)
MCHC: 33.2 g/dL (ref 30.0–36.0)
MCV: 91.2 fL (ref 78.0–100.0)
MCV: 89.7 fL (ref 78.0–100.0)
Platelets: 153 10*3/uL (ref 150–400)
Platelets: 150 10*3/uL (ref 150–400)
RBC: 4.42 MIL/uL (ref 4.22–5.81)
RBC: 4.19 MIL/uL — ABNORMAL LOW (ref 4.22–5.81)
RDW: 14.3 % (ref 11.5–15.5)
RDW: 14 % (ref 11.5–15.5)
WBC: 18.3 10*3/uL — ABNORMAL HIGH (ref 4.0–10.5)
WBC: 15.4 10*3/uL — ABNORMAL HIGH (ref 4.0–10.5)

## 2017-03-24 LAB — HEMOGLOBIN AND HEMATOCRIT, BLOOD
HCT: 30.2 % — ABNORMAL LOW (ref 39.0–52.0)
Hemoglobin: 10.1 g/dL — ABNORMAL LOW (ref 13.0–17.0)

## 2017-03-24 LAB — PLATELET COUNT: Platelets: 124 10*3/uL — ABNORMAL LOW (ref 150–400)

## 2017-03-24 LAB — PROTIME-INR
INR: 1.27
Prothrombin Time: 15.8 seconds — ABNORMAL HIGH (ref 11.4–15.2)

## 2017-03-24 LAB — CREATININE, SERUM
Creatinine, Ser: 0.98 mg/dL (ref 0.61–1.24)
GFR calc Af Amer: >60 mL/min (ref >60)
GFR calc non Af Amer: >60 mL/min (ref >60)

## 2017-03-24 LAB — MAGNESIUM: Magnesium: 3 mg/dL — ABNORMAL HIGH (ref 1.7–2.4)

## 2017-03-24 SURGERY — REPAIR, MITRAL VALVE
Anesthesia: General | Site: Chest

## 2017-03-24 MED ORDER — ONDANSETRON HCL 4 MG/2ML IJ SOLN
INTRAMUSCULAR | Status: AC
  Filled 2017-03-24: qty 2

## 2017-03-24 MED ORDER — OXYCODONE HCL 5 MG PO TABS
5.0000 mg | ORAL_TABLET | ORAL | Status: DC | PRN
  Administered 2017-03-25 – 2017-03-28 (×12): 10 mg via ORAL
  Filled 2017-03-24 (×12): qty 2

## 2017-03-24 MED ORDER — SODIUM CHLORIDE 0.9% FLUSH
3.0000 mL | Freq: Two times a day (BID) | INTRAVENOUS | Status: DC
  Administered 2017-03-25 – 2017-03-29 (×4): 3 mL via INTRAVENOUS

## 2017-03-24 MED ORDER — MORPHINE SULFATE (PF) 2 MG/ML IV SOLN: 1.0000 mg | INTRAVENOUS | Status: DC | PRN

## 2017-03-24 MED ORDER — PROTAMINE SULFATE 10 MG/ML IV SOLN
INTRAVENOUS | Status: DC | PRN
  Administered 2017-03-24: 340 mg via INTRAVENOUS
  Administered 2017-03-24: 10 mg via INTRAVENOUS

## 2017-03-24 MED ORDER — VANCOMYCIN HCL IN DEXTROSE 1-5 GM/200ML-% IV SOLN
1000.0000 mg | Freq: Once | INTRAVENOUS | Status: AC
  Administered 2017-03-24: 1000 mg via INTRAVENOUS
  Filled 2017-03-24: qty 200

## 2017-03-24 MED ORDER — LACTATED RINGERS IV SOLN
INTRAVENOUS | Status: DC | PRN
  Administered 2017-03-24: 08:00:00 via INTRAVENOUS

## 2017-03-24 MED ORDER — SODIUM CHLORIDE 0.9 % IV SOLN
0.0000 ug/min | INTRAVENOUS | Status: DC
  Administered 2017-03-25: 20 ug/min via INTRAVENOUS
  Filled 2017-03-24 (×2): qty 2

## 2017-03-24 MED ORDER — NITROGLYCERIN IN D5W 200-5 MCG/ML-% IV SOLN: 0.0000 ug/min | INTRAVENOUS | Status: DC

## 2017-03-24 MED ORDER — CHLORHEXIDINE GLUCONATE 0.12% ORAL RINSE (MEDLINE KIT)
15.0000 mL | Freq: Two times a day (BID) | OROMUCOSAL | Status: DC
Start: 2017-03-24 — End: 2017-03-25
  Administered 2017-03-24: 15 mL via OROMUCOSAL

## 2017-03-24 MED ORDER — PANTOPRAZOLE SODIUM 40 MG PO TBEC
40.0000 mg | DELAYED_RELEASE_TABLET | Freq: Every day | ORAL | Status: DC
  Administered 2017-03-26 – 2017-03-30 (×5): 40 mg via ORAL
  Filled 2017-03-24 (×6): qty 1

## 2017-03-24 MED ORDER — MORPHINE SULFATE (PF) 4 MG/ML IV SOLN
1.0000 mg | INTRAVENOUS | Status: DC | PRN
  Administered 2017-03-24 – 2017-03-26 (×5): 2 mg via INTRAVENOUS
  Filled 2017-03-24 (×6): qty 1

## 2017-03-24 MED ORDER — ASPIRIN EC 325 MG PO TBEC: 325.0000 mg | DELAYED_RELEASE_TABLET | Freq: Every day | ORAL | Status: DC

## 2017-03-24 MED ORDER — MIDAZOLAM HCL 2 MG/2ML IJ SOLN: 2.0000 mg | INTRAMUSCULAR | Status: DC | PRN

## 2017-03-24 MED ORDER — BISACODYL 5 MG PO TBEC
10.0000 mg | DELAYED_RELEASE_TABLET | Freq: Every day | ORAL | Status: DC
  Administered 2017-03-25 – 2017-03-30 (×6): 10 mg via ORAL
  Filled 2017-03-24 (×6): qty 2

## 2017-03-24 MED ORDER — LACTATED RINGERS IV SOLN
INTRAVENOUS | Status: DC | PRN
  Administered 2017-03-24: 09:00:00 via INTRAVENOUS

## 2017-03-24 MED ORDER — INSULIN REGULAR BOLUS VIA INFUSION
0.0000 [IU] | Freq: Three times a day (TID) | INTRAVENOUS | Status: DC
  Filled 2017-03-24: qty 10

## 2017-03-24 MED ORDER — SUCCINYLCHOLINE CHLORIDE 20 MG/ML IJ SOLN
INTRAMUSCULAR | Status: DC | PRN
  Administered 2017-03-24: 140 mg via INTRAVENOUS

## 2017-03-24 MED ORDER — HEPARIN SODIUM (PORCINE) 1000 UNIT/ML IJ SOLN
INTRAMUSCULAR | Status: AC
  Filled 2017-03-24: qty 2

## 2017-03-24 MED ORDER — PROTAMINE SULFATE 10 MG/ML IV SOLN
INTRAVENOUS | Status: AC
  Filled 2017-03-24: qty 50

## 2017-03-24 MED ORDER — DEXAMETHASONE SODIUM PHOSPHATE 10 MG/ML IJ SOLN
INTRAMUSCULAR | Status: AC
  Filled 2017-03-24: qty 1

## 2017-03-24 MED ORDER — MIDAZOLAM HCL 10 MG/2ML IJ SOLN
INTRAMUSCULAR | Status: AC
  Filled 2017-03-24: qty 2

## 2017-03-24 MED ORDER — SODIUM CHLORIDE 0.9 % IR SOLN
Status: DC | PRN
  Administered 2017-03-24: 3000 mL

## 2017-03-24 MED ORDER — SODIUM CHLORIDE 0.9 % IV SOLN
0.0000 ug/kg/h | INTRAVENOUS | Status: DC
  Administered 2017-03-25: 0.1 ug/kg/h via INTRAVENOUS
  Filled 2017-03-24: qty 2

## 2017-03-24 MED ORDER — SODIUM CHLORIDE 0.9 % IV SOLN
INTRAVENOUS | Status: DC
  Administered 2017-03-24: 15:00:00 via INTRAVENOUS

## 2017-03-24 MED ORDER — SODIUM CHLORIDE 0.9 % IJ SOLN
OROMUCOSAL | Status: DC | PRN
  Administered 2017-03-24: 4 mL via TOPICAL

## 2017-03-24 MED ORDER — FAMOTIDINE IN NACL 20-0.9 MG/50ML-% IV SOLN
20.0000 mg | Freq: Two times a day (BID) | INTRAVENOUS | Status: DC
  Administered 2017-03-24: 20 mg via INTRAVENOUS

## 2017-03-24 MED ORDER — DEXAMETHASONE SODIUM PHOSPHATE 10 MG/ML IJ SOLN
INTRAMUSCULAR | Status: DC | PRN
  Administered 2017-03-24: 5 mg via INTRAVENOUS

## 2017-03-24 MED ORDER — DEXMEDETOMIDINE HCL IN NACL 200 MCG/50ML IV SOLN
INTRAVENOUS | Status: AC
  Filled 2017-03-24: qty 50

## 2017-03-24 MED ORDER — ONDANSETRON HCL 4 MG/2ML IJ SOLN
4.0000 mg | Freq: Four times a day (QID) | INTRAMUSCULAR | Status: DC | PRN
  Administered 2017-03-24 – 2017-03-26 (×3): 4 mg via INTRAVENOUS
  Filled 2017-03-24 (×3): qty 2

## 2017-03-24 MED ORDER — EPHEDRINE 5 MG/ML INJ
INTRAVENOUS | Status: AC
  Filled 2017-03-24: qty 10

## 2017-03-24 MED ORDER — ARTIFICIAL TEARS OPHTHALMIC OINT
TOPICAL_OINTMENT | OPHTHALMIC | Status: DC | PRN
  Administered 2017-03-24: 1 via OPHTHALMIC

## 2017-03-24 MED ORDER — SODIUM CHLORIDE 0.9 % IV SOLN: 250.0000 mL | INTRAVENOUS | Status: DC

## 2017-03-24 MED ORDER — FENTANYL CITRATE (PF) 250 MCG/5ML IJ SOLN
INTRAMUSCULAR | Status: AC
  Filled 2017-03-24: qty 25

## 2017-03-24 MED ORDER — PROPOFOL 10 MG/ML IV BOLUS
INTRAVENOUS | Status: DC | PRN
Start: 2017-03-24 — End: 2017-03-24
  Administered 2017-03-24: 120 mg via INTRAVENOUS
  Administered 2017-03-24: 80 mg via INTRAVENOUS

## 2017-03-24 MED ORDER — MIDAZOLAM HCL 5 MG/5ML IJ SOLN
INTRAMUSCULAR | Status: DC | PRN
  Administered 2017-03-24 (×4): 1 mg via INTRAVENOUS
  Administered 2017-03-24: 2 mg via INTRAVENOUS
  Administered 2017-03-24: 1 mg via INTRAVENOUS

## 2017-03-24 MED ORDER — FENTANYL CITRATE (PF) 250 MCG/5ML IJ SOLN
INTRAMUSCULAR | Status: DC | PRN
  Administered 2017-03-24: 100 ug via INTRAVENOUS
  Administered 2017-03-24 (×2): 150 ug via INTRAVENOUS
  Administered 2017-03-24 (×2): 50 ug via INTRAVENOUS
  Administered 2017-03-24: 150 ug via INTRAVENOUS
  Administered 2017-03-24: 50 ug via INTRAVENOUS
  Administered 2017-03-24: 100 ug via INTRAVENOUS
  Administered 2017-03-24: 50 ug via INTRAVENOUS
  Administered 2017-03-24: 150 ug via INTRAVENOUS
  Administered 2017-03-24: 50 ug via INTRAVENOUS
  Administered 2017-03-24: 200 ug via INTRAVENOUS

## 2017-03-24 MED ORDER — DEXTROSE 5 % IV SOLN
1.5000 g | Freq: Two times a day (BID) | INTRAVENOUS | Status: AC
  Administered 2017-03-25 – 2017-03-26 (×4): 1.5 g via INTRAVENOUS
  Filled 2017-03-24 (×4): qty 1.5

## 2017-03-24 MED ORDER — ORAL CARE MOUTH RINSE: 15.0000 mL | Freq: Two times a day (BID) | OROMUCOSAL | Status: DC

## 2017-03-24 MED ORDER — MAGNESIUM SULFATE 4 GM/100ML IV SOLN
4.0000 g | Freq: Once | INTRAVENOUS | Status: AC
  Administered 2017-03-24: 4 g via INTRAVENOUS

## 2017-03-24 MED ORDER — SODIUM CHLORIDE 0.9 % IV SOLN
INTRAVENOUS | Status: DC
  Administered 2017-03-25: 3.6 [IU]/h via INTRAVENOUS
  Filled 2017-03-24: qty 1

## 2017-03-24 MED ORDER — HEPARIN SODIUM (PORCINE) 1000 UNIT/ML IJ SOLN
INTRAMUSCULAR | Status: DC | PRN
  Administered 2017-03-24: 36000 [IU] via INTRAVENOUS

## 2017-03-24 MED ORDER — ARTIFICIAL TEARS OPHTHALMIC OINT
TOPICAL_OINTMENT | OPHTHALMIC | Status: AC
  Filled 2017-03-24: qty 3.5

## 2017-03-24 MED ORDER — HEPARIN SODIUM (PORCINE) 1000 UNIT/ML IJ SOLN
INTRAMUSCULAR | Status: AC
  Filled 2017-03-24: qty 1

## 2017-03-24 MED ORDER — ACETAMINOPHEN 160 MG/5ML PO SOLN: 650.0000 mg | Freq: Once | ORAL | Status: AC

## 2017-03-24 MED ORDER — SODIUM CHLORIDE 0.9 % IJ SOLN
INTRAMUSCULAR | Status: DC | PRN
  Administered 2017-03-24: 4 mL via TOPICAL

## 2017-03-24 MED ORDER — CHLORHEXIDINE GLUCONATE 0.12 % MT SOLN
15.0000 mL | OROMUCOSAL | Status: AC
  Administered 2017-03-24: 15 mL via OROMUCOSAL

## 2017-03-24 MED ORDER — ACETAMINOPHEN 650 MG RE SUPP: 650.0000 mg | Freq: Once | RECTAL | Status: AC

## 2017-03-24 MED ORDER — ORAL CARE MOUTH RINSE
15.0000 mL | OROMUCOSAL | Status: DC
  Administered 2017-03-24: 15 mL via OROMUCOSAL

## 2017-03-24 MED ORDER — BISACODYL 10 MG RE SUPP
10.0000 mg | Freq: Every day | RECTAL | Status: DC
  Filled 2017-03-24: qty 1

## 2017-03-24 MED ORDER — ONDANSETRON HCL 4 MG/2ML IJ SOLN
INTRAMUSCULAR | Status: DC | PRN
  Administered 2017-03-24: 4 mg via INTRAVENOUS

## 2017-03-24 MED ORDER — PHENYLEPHRINE HCL 10 MG/ML IJ SOLN
INTRAMUSCULAR | Status: DC | PRN
  Administered 2017-03-24: 20 ug/min via INTRAVENOUS

## 2017-03-24 MED ORDER — LACTATED RINGERS IV SOLN: INTRAVENOUS | Status: DC

## 2017-03-24 MED ORDER — PROTAMINE SULFATE 10 MG/ML IV SOLN
INTRAVENOUS | Status: AC
  Filled 2017-03-24: qty 10

## 2017-03-24 MED ORDER — METOPROLOL TARTRATE 25 MG/10 ML ORAL SUSPENSION: 12.5000 mg | Freq: Two times a day (BID) | ORAL | Status: DC

## 2017-03-24 MED ORDER — ROCURONIUM BROMIDE 10 MG/ML (PF) SYRINGE
PREFILLED_SYRINGE | INTRAVENOUS | Status: DC | PRN
  Administered 2017-03-24 (×2): 100 mg via INTRAVENOUS
  Administered 2017-03-24: 40 mg via INTRAVENOUS

## 2017-03-24 MED ORDER — EPHEDRINE SULFATE 50 MG/ML IJ SOLN
INTRAMUSCULAR | Status: DC | PRN
  Administered 2017-03-24 (×2): 10 mg via INTRAVENOUS
  Administered 2017-03-24: 5 mg via INTRAVENOUS
  Administered 2017-03-24: 10 mg via INTRAVENOUS

## 2017-03-24 MED ORDER — POTASSIUM CHLORIDE 10 MEQ/50ML IV SOLN: 10.0000 meq | INTRAVENOUS | Status: AC

## 2017-03-24 MED ORDER — SODIUM CHLORIDE 0.45 % IV SOLN: INTRAVENOUS | Status: DC | PRN

## 2017-03-24 MED ORDER — SODIUM CHLORIDE 0.9 % IV SOLN: INTRAVENOUS | Status: DC

## 2017-03-24 MED ORDER — ALBUMIN HUMAN 5 % IV SOLN
250.0000 mL | INTRAVENOUS | Status: AC | PRN
  Administered 2017-03-24 (×2): 250 mL via INTRAVENOUS
  Filled 2017-03-24: qty 250

## 2017-03-24 MED ORDER — ROCURONIUM BROMIDE 10 MG/ML (PF) SYRINGE
PREFILLED_SYRINGE | INTRAVENOUS | Status: AC
  Filled 2017-03-24: qty 30

## 2017-03-24 MED ORDER — SODIUM CHLORIDE 0.9% FLUSH: 3.0000 mL | INTRAVENOUS | Status: DC | PRN

## 2017-03-24 MED ORDER — DOCUSATE SODIUM 100 MG PO CAPS
200.0000 mg | ORAL_CAPSULE | Freq: Every day | ORAL | Status: DC
  Administered 2017-03-25 – 2017-03-29 (×5): 200 mg via ORAL
  Filled 2017-03-24 (×5): qty 2

## 2017-03-24 MED ORDER — TRAMADOL HCL 50 MG PO TABS
50.0000 mg | ORAL_TABLET | ORAL | Status: DC | PRN
  Administered 2017-03-25: 50 mg via ORAL
  Administered 2017-03-25 – 2017-03-30 (×11): 100 mg via ORAL
  Filled 2017-03-24: qty 1
  Filled 2017-03-24 (×12): qty 2

## 2017-03-24 MED ORDER — PROPOFOL 10 MG/ML IV BOLUS
INTRAVENOUS | Status: AC
  Filled 2017-03-24: qty 20

## 2017-03-24 MED ORDER — LACTATED RINGERS IV SOLN
INTRAVENOUS | Status: DC
  Administered 2017-03-25: 20 mL/h via INTRAVENOUS

## 2017-03-24 MED ORDER — ACETAMINOPHEN 500 MG PO TABS
1000.0000 mg | ORAL_TABLET | Freq: Four times a day (QID) | ORAL | Status: AC
  Administered 2017-03-25 – 2017-03-29 (×19): 1000 mg via ORAL
  Filled 2017-03-24 (×19): qty 2

## 2017-03-24 MED ORDER — CHLORHEXIDINE GLUCONATE 4 % EX LIQD: 30.0000 mL | CUTANEOUS | Status: DC

## 2017-03-24 MED ORDER — METOPROLOL TARTRATE 12.5 MG HALF TABLET: 12.5000 mg | ORAL_TABLET | Freq: Two times a day (BID) | ORAL | Status: DC

## 2017-03-24 MED ORDER — 0.9 % SODIUM CHLORIDE (POUR BTL) OPTIME
TOPICAL | Status: DC | PRN
  Administered 2017-03-24: 5000 mL

## 2017-03-24 MED ORDER — LACTATED RINGERS IV SOLN: 500.0000 mL | Freq: Once | INTRAVENOUS | Status: DC | PRN

## 2017-03-24 MED ORDER — ASPIRIN 81 MG PO CHEW: 324.0000 mg | CHEWABLE_TABLET | Freq: Every day | ORAL | Status: DC

## 2017-03-24 MED ORDER — METOPROLOL TARTRATE 5 MG/5ML IV SOLN: 2.5000 mg | INTRAVENOUS | Status: DC | PRN

## 2017-03-24 MED ORDER — SODIUM CHLORIDE 0.9 % IV SOLN
INTRAVENOUS | Status: DC | PRN
  Administered 2017-03-24: 14:00:00 via INTRAVENOUS

## 2017-03-24 MED ORDER — ALBUMIN HUMAN 5 % IV SOLN
INTRAVENOUS | Status: DC | PRN
  Administered 2017-03-24: 14:00:00 via INTRAVENOUS

## 2017-03-24 MED ORDER — ACETAMINOPHEN 160 MG/5ML PO SOLN: 1000.0000 mg | Freq: Four times a day (QID) | ORAL | Status: DC

## 2017-03-24 MED FILL — Sodium Chloride IV Soln 0.9%: INTRAVENOUS | Qty: 2000 | Status: AC

## 2017-03-24 MED FILL — Heparin Sodium (Porcine) Inj 1000 Unit/ML: INTRAMUSCULAR | Qty: 10 | Status: AC

## 2017-03-24 MED FILL — Electrolyte-R (PH 7.4) Solution: INTRAVENOUS | Qty: 3000 | Status: AC

## 2017-03-24 MED FILL — Lidocaine HCl IV Inj 20 MG/ML: INTRAVENOUS | Qty: 25 | Status: AC

## 2017-03-24 MED FILL — Mannitol IV Soln 20%: INTRAVENOUS | Qty: 1000 | Status: AC

## 2017-03-24 MED FILL — Sodium Bicarbonate IV Soln 8.4%: INTRAVENOUS | Qty: 50 | Status: AC

## 2017-03-24 SURGICAL SUPPLY — 150 items
ADAPTER CARDIO PERF ANTE/RETRO (ADAPTER) ×3 IMPLANT
ADH SKN CLS APL DERMABOND .7 (GAUZE/BANDAGES/DRESSINGS) ×2
ADPR PRFSN 84XANTGRD RTRGD (ADAPTER) ×2
APPLICATOR COTTON TIP 6IN STRL (MISCELLANEOUS) IMPLANT
BAG DECANTER FOR FLEXI CONT (MISCELLANEOUS) ×8 IMPLANT
BANDAGE ACE 4X5 VEL STRL LF (GAUZE/BANDAGES/DRESSINGS) ×3 IMPLANT
BANDAGE ACE 6X5 VEL STRL LF (GAUZE/BANDAGES/DRESSINGS) ×3 IMPLANT
BASKET HEART (ORDER IN 25'S) (MISCELLANEOUS) ×1
BASKET HEART (ORDER IN 25S) (MISCELLANEOUS) ×2 IMPLANT
BLADE CLIPPER SURG (BLADE) ×1 IMPLANT
BLADE STERNUM SYSTEM 6 (BLADE) ×5 IMPLANT
BLADE SURG 11 STRL SS (BLADE) ×4 IMPLANT
BNDG GAUZE ELAST 4 BULKY (GAUZE/BANDAGES/DRESSINGS) ×3 IMPLANT
CANISTER SUCT 3000ML PPV (MISCELLANEOUS) ×5 IMPLANT
CANN PRFSN 3/8X14X24FR PCFC (MISCELLANEOUS)
CANN PRFSN 3/8XCNCT ST RT ANG (MISCELLANEOUS)
CANNULA EZ GLIDE AORTIC 21FR (CANNULA) ×7 IMPLANT
CANNULA FEM VENOUS REMOTE 22FR (CANNULA) ×1 IMPLANT
CANNULA GUNDRY RCSP 15FR (MISCELLANEOUS) ×1 IMPLANT
CANNULA PRFSN 3/8X14X24FR PCFC (MISCELLANEOUS) IMPLANT
CANNULA PRFSN 3/8XCNCT RT ANG (MISCELLANEOUS) IMPLANT
CANNULA VEN MTL TIP RT (MISCELLANEOUS)
CANNULA VENNOUS METAL TIP 20FR (CANNULA) ×1 IMPLANT
CATH CPB KIT OWEN (MISCELLANEOUS) ×3 IMPLANT
CATH FOLEY 2WAY SLVR  5CC 14FR (CATHETERS)
CATH FOLEY 2WAY SLVR 5CC 14FR (CATHETERS) IMPLANT
CATH THORACIC 28FR RT ANG (CATHETERS) IMPLANT
CATH THORACIC 36FR (CATHETERS) ×3 IMPLANT
CLIP FOGARTY SPRING 6M (CLIP) IMPLANT
CLIP RETRACTION 3.0MM CORONARY (MISCELLANEOUS) ×1 IMPLANT
CLIP VESOCCLUDE MED 24/CT (CLIP) IMPLANT
CLIP VESOCCLUDE SM WIDE 24/CT (CLIP) ×3 IMPLANT
CONN 1/2X1/2X1/2  BEN (MISCELLANEOUS) ×1
CONN 1/2X1/2X1/2 BEN (MISCELLANEOUS) ×2 IMPLANT
CONN 3/8X1/2 ST GISH (MISCELLANEOUS) ×6 IMPLANT
COVER SURGICAL LIGHT HANDLE (MISCELLANEOUS) ×4 IMPLANT
CRADLE DONUT ADULT HEAD (MISCELLANEOUS) ×5 IMPLANT
DERMABOND ADVANCED (GAUZE/BANDAGES/DRESSINGS) ×1
DERMABOND ADVANCED .7 DNX12 (GAUZE/BANDAGES/DRESSINGS) IMPLANT
DEVICE SUT CK QUICK LOAD INDV (Prosthesis & Implant Heart) ×4 IMPLANT
DEVICE SUT CK QUICK LOAD MINI (Prosthesis & Implant Heart) ×1 IMPLANT
DRAIN CHANNEL 32F RND 10.7 FF (WOUND CARE) ×6 IMPLANT
DRAPE BILATERAL SPLIT (DRAPES) IMPLANT
DRAPE CARDIOVASCULAR INCISE (DRAPES) ×3
DRAPE CV SPLIT W-CLR ANES SCRN (DRAPES) IMPLANT
DRAPE INCISE IOBAN 66X45 STRL (DRAPES) ×6 IMPLANT
DRAPE SLUSH/WARMER DISC (DRAPES) ×3 IMPLANT
DRAPE SRG 135X102X78XABS (DRAPES) ×2 IMPLANT
DRSG AQUACEL AG ADV 3.5X14 (GAUZE/BANDAGES/DRESSINGS) ×1 IMPLANT
DRSG COVADERM 4X14 (GAUZE/BANDAGES/DRESSINGS) ×4 IMPLANT
ELECT BLADE 4.0 EZ CLEAN MEGAD (MISCELLANEOUS) ×3
ELECT REM PT RETURN 9FT ADLT (ELECTROSURGICAL) ×6
ELECTRODE BLDE 4.0 EZ CLN MEGD (MISCELLANEOUS) ×2 IMPLANT
ELECTRODE REM PT RTRN 9FT ADLT (ELECTROSURGICAL) ×8 IMPLANT
FELT TEFLON 1X6 (MISCELLANEOUS) ×9 IMPLANT
GAUZE SPONGE 4X4 12PLY STRL (GAUZE/BANDAGES/DRESSINGS) ×9 IMPLANT
GAUZE SPONGE 4X4 12PLY STRL LF (GAUZE/BANDAGES/DRESSINGS) ×1 IMPLANT
GLOVE BIO SURGEON STRL SZ 6 (GLOVE) IMPLANT
GLOVE BIO SURGEON STRL SZ 6.5 (GLOVE) IMPLANT
GLOVE BIO SURGEON STRL SZ7 (GLOVE) IMPLANT
GLOVE BIO SURGEON STRL SZ7.5 (GLOVE) IMPLANT
GLOVE BIO SURGEON STRL SZ8.5 (GLOVE) ×1 IMPLANT
GLOVE ORTHO TXT STRL SZ7.5 (GLOVE) ×6 IMPLANT
GOWN STRL REUS W/ TWL LRG LVL3 (GOWN DISPOSABLE) ×16 IMPLANT
GOWN STRL REUS W/TWL LRG LVL3 (GOWN DISPOSABLE) ×24
HEMOSTAT POWDER SURGIFOAM 1G (HEMOSTASIS) ×13 IMPLANT
INSERT FOGARTY XLG (MISCELLANEOUS) ×5 IMPLANT
KIT BASIN OR (CUSTOM PROCEDURE TRAY) ×5 IMPLANT
KIT DILATOR VASC 18G NDL (KITS) ×1 IMPLANT
KIT DRAINAGE VACCUM ASSIST (KITS) ×1 IMPLANT
KIT ROOM TURNOVER OR (KITS) ×5 IMPLANT
KIT SUCTION CATH 14FR (SUCTIONS) ×18 IMPLANT
KIT SUT CK MINI COMBO 4X17 (Prosthesis & Implant Heart) ×1 IMPLANT
KIT VASOVIEW HEMOPRO VH 3000 (KITS) ×2 IMPLANT
LEAD PACING MYOCARDI (MISCELLANEOUS) ×3 IMPLANT
LINE VENT (MISCELLANEOUS) ×1 IMPLANT
LOOP VESSEL SUPERMAXI WHITE (MISCELLANEOUS) ×1 IMPLANT
MARKER GRAFT CORONARY BYPASS (MISCELLANEOUS) ×9 IMPLANT
NS IRRIG 1000ML POUR BTL (IV SOLUTION) ×23 IMPLANT
PACK OPEN HEART (CUSTOM PROCEDURE TRAY) ×5 IMPLANT
PAD ARMBOARD 7.5X6 YLW CONV (MISCELLANEOUS) ×10 IMPLANT
PAD ELECT DEFIB RADIOL ZOLL (MISCELLANEOUS) ×3 IMPLANT
PENCIL BUTTON HOLSTER BLD 10FT (ELECTRODE) ×3 IMPLANT
PUNCH AORTIC ROTATE 4.0MM (MISCELLANEOUS) IMPLANT
PUNCH AORTIC ROTATE 4.5MM 8IN (MISCELLANEOUS) IMPLANT
PUNCH AORTIC ROTATE 5MM 8IN (MISCELLANEOUS) IMPLANT
RING ANNUFLOFLEX SZ 28 (Ring) ×1 IMPLANT
SET CARDIOPLEGIA MPS 5001102 (MISCELLANEOUS) ×1 IMPLANT
SET IRRIG TUBING LAPAROSCOPIC (IRRIGATION / IRRIGATOR) ×3 IMPLANT
SOLUTION ANTI FOG 6CC (MISCELLANEOUS) ×1 IMPLANT
SPONGE LAP 18X18 X RAY DECT (DISPOSABLE) IMPLANT
SPONGE LAP 4X18 X RAY DECT (DISPOSABLE) ×1 IMPLANT
SUCKER INTRACARDIAC WEIGHTED (SUCKER) ×3 IMPLANT
SUT BONE WAX W31G (SUTURE) ×3 IMPLANT
SUT ETHIBOND (SUTURE) ×3 IMPLANT
SUT ETHIBOND 2 0 SH (SUTURE) ×7 IMPLANT
SUT ETHIBOND 2 0 SH 36X2 (SUTURE) ×4 IMPLANT
SUT ETHIBOND 2 0 V4 (SUTURE) IMPLANT
SUT ETHIBOND 2 0V4 GREEN (SUTURE) IMPLANT
SUT ETHIBOND 2-0 RB-1 WHT (SUTURE) ×1 IMPLANT
SUT ETHIBOND 4 0 TF (SUTURE) IMPLANT
SUT ETHIBOND 5 0 C 1 30 (SUTURE) ×3 IMPLANT
SUT ETHIBOND X763 2 0 SH 1 (SUTURE) ×8 IMPLANT
SUT MNCRL AB 3-0 PS2 18 (SUTURE) ×6 IMPLANT
SUT MNCRL AB 4-0 PS2 18 (SUTURE) IMPLANT
SUT PDS AB 1 CTX 36 (SUTURE) ×6 IMPLANT
SUT PROLENE 2 0 SH DA (SUTURE) IMPLANT
SUT PROLENE 3 0 SH 1 (SUTURE) ×3 IMPLANT
SUT PROLENE 3 0 SH DA (SUTURE) ×3 IMPLANT
SUT PROLENE 3 0 SH1 36 (SUTURE) IMPLANT
SUT PROLENE 4 0 RB 1 (SUTURE) ×9
SUT PROLENE 4 0 SH DA (SUTURE) ×6 IMPLANT
SUT PROLENE 4-0 RB1 .5 CRCL 36 (SUTURE) ×4 IMPLANT
SUT PROLENE 5 0 C 1 36 (SUTURE) IMPLANT
SUT PROLENE 6 0 C 1 30 (SUTURE) ×3 IMPLANT
SUT PROLENE 7.0 RB 3 (SUTURE) ×9 IMPLANT
SUT PROLENE 8 0 BV175 6 (SUTURE) IMPLANT
SUT PROLENE BLUE 7 0 (SUTURE) ×3 IMPLANT
SUT PROLENE POLY MONO (SUTURE) IMPLANT
SUT PTFE CHORD X 20MM (SUTURE) ×1 IMPLANT
SUT PTFE CHORD X SYSTEM (SUTURE) ×1 IMPLANT
SUT SILK  1 MH (SUTURE) ×2
SUT SILK 1 MH (SUTURE) ×2 IMPLANT
SUT STEEL 6MS V (SUTURE) IMPLANT
SUT STEEL STERNAL CCS#1 18IN (SUTURE) ×1 IMPLANT
SUT STEEL SZ 6 DBL 3X14 BALL (SUTURE) ×2 IMPLANT
SUT VIC AB 1 CTX 36 (SUTURE)
SUT VIC AB 1 CTX36XBRD ANBCTR (SUTURE) IMPLANT
SUT VIC AB 2-0 CT1 27 (SUTURE)
SUT VIC AB 2-0 CT1 TAPERPNT 27 (SUTURE) IMPLANT
SUT VIC AB 2-0 CTX 27 (SUTURE) IMPLANT
SUT VIC AB 3-0 SH 27 (SUTURE)
SUT VIC AB 3-0 SH 27X BRD (SUTURE) IMPLANT
SUT VIC AB 3-0 X1 27 (SUTURE) IMPLANT
SUT VICRYL 4-0 PS2 18IN ABS (SUTURE) IMPLANT
SUTURE E-PAK OPEN HEART (SUTURE) ×2 IMPLANT
SYSTEM SAHARA CHEST DRAIN ATS (WOUND CARE) ×5 IMPLANT
TAPE CLOTH SURG 4X10 WHT LF (GAUZE/BANDAGES/DRESSINGS) ×1 IMPLANT
TAPE PAPER 2X10 WHT MICROPORE (GAUZE/BANDAGES/DRESSINGS) ×1 IMPLANT
TOWEL GREEN STERILE (TOWEL DISPOSABLE) ×17 IMPLANT
TOWEL GREEN STERILE FF (TOWEL DISPOSABLE) ×9 IMPLANT
TOWEL OR 17X24 6PK STRL BLUE (TOWEL DISPOSABLE) ×6 IMPLANT
TOWEL OR 17X26 10 PK STRL BLUE (TOWEL DISPOSABLE) ×6 IMPLANT
TRAY CATH LUMEN 1 20CM STRL (SET/KITS/TRAYS/PACK) ×1 IMPLANT
TRAY FOLEY SILVER 16FR TEMP (SET/KITS/TRAYS/PACK) ×5 IMPLANT
TUBING INSUFFLATION (TUBING) ×3 IMPLANT
TUBING INSUFFLATION 10FT LAP (TUBING) ×3 IMPLANT
UNDERPAD 30X30 (UNDERPADS AND DIAPERS) ×5 IMPLANT
WATER STERILE IRR 1000ML POUR (IV SOLUTION) ×10 IMPLANT
YANKAUER SUCT BULB TIP NO VENT (SUCTIONS) ×1 IMPLANT

## 2017-03-24 NOTE — Interval H&P Note (Signed)
History and Physical Interval Note:  03/24/2017 6:43 AM  James [name]  has presented today for surgery, with the diagnosis of mitral regurgitation, coronary artery diseas   The various methods of treatment have been discussed with the patient and family. After consideration of risks, benefits and other options for treatment, the patient has consented to  Procedure(s): MITRAL VALVE REPAIR (MVR) (N/A) CORONARY ARTERY BYPASS GRAFTING (CABG) (N/A) TRANSESOPHAGEAL ECHOCARDIOGRAM (TEE) (N/A) as a surgical intervention .  The patient's history has been reviewed, patient examined, no change in status, stable for surgery.  I have reviewed the patient's chart and labs.  Questions were answered to the patient's satisfaction.     Rexene Alberts

## 2017-03-24 NOTE — Op Note (Signed)
CARDIOTHORACIC SURGERY OPERATIVE NOTE  Date of Procedure:  03/24/2017  Preoperative Diagnosis:   Severe Mitral Regurgitation  Single-vessel Coronary Artery Disease  Postoperative Diagnosis: Same  Procedure:    Mitral Valve Repair  Complex valvuloplasty including artificial Gore-tex neochord placement x6  Sorin Carbomedics Annuloflex posterior annuloplasty band (size 22mm, catalog #[id number], serial [id number])   Coronary Artery Bypass Grafting x 1   Left Internal Mammary Artery to Distal Left Anterior Descending Coronary Artery  Surgeon: Valentina Gu. Roxy Manns, MD  Assistant: Nicholes Rough, PA-C  Anesthesia: Roberts Gaudy, MD  Operative Findings:  Normal LV systolic function  Fibroelastic deficiency type myxomatous degenerative disease with multiple ruptured primary chordae tendinae and flail segment (P3) of posterior leaflet  Type II dysfunction with severe mitral regurgitation  Good quality LIMA conduit for grafting  Good quality LAD target vessel for grafting  Trace residual mitral regurgitation after successful valve repair                  BRIEF CLINICAL NOTE AND INDICATIONS FOR SURGERY  James [name] is a 66 year old male with history of mitral valve prolapse with severe symptomatic primary mitral regurgitation, lung cancer, obstructive sleep apnea on CPAP, type 2 diabetes mellitus, and hyperlipidemia who returns to the office today for follow-up of mitral regurgitation with tentative plans to proceed with mitral valve repair and coronary artery bypass grafting on Wednesday, March 24, 2017. I initially had the opportunity to evaluate the James [name] in consultation on 08/21/2016. Prior to that transthoracic and transesophageal echocardiograms revealed the presence of mitral valve prolapse with severe mitral regurgitation and normal left ventricular systolic function. Diagnostic cardiac catheterization revealed a long segment 65% stenosis of the left anterior  descending coronary artery with borderline significant hemodynamic parameters on FFR analysis. At the same time the James [name] was: Incidentally noted to have a 2.6 cm mass in the right upper lobe consistent with primary lung cancer, clinical stage I. The decision was made to manage the James [name]'s cardiac disease medically over the short-term to facilitate expedient treatment of the newly discovered lung cancer. The James [name] underwent uncomplicated video assisted right upper lobectomy with mediastinal lymph node dissection by Dr. [name] on 09/02/2016. Final pathology was consistent with moderately differentiated adenocarcinoma with negative surgical margins and no evidence for visceral pleural invasion nor lymphovascular invasion. All lymph nodes were negative, consistent with pathologic stage IA(T1cN0 M0).The James [name] was seen in consultation by Dr. Inda Merlin at the cancer center. Adjuvant chemotherapy was not recommended. The James [name] has overall done fairly well, although he was readmitted to the hospital in early June with shortness of breath that was felt to be primarily related to volume overload. CT angiogram at that time revealed what was felt to be a very small pulmonary embolus in the right lower lobe.Xarelto was started at that time.  I saw him in follow-up in early October at which time we discussed the possibility of proceeding with elective mitral valve repair and coronary artery bypass grafting in the near future.  He underwent follow-up transthoracic echocardiogram and pulmonary function testing, and he has recently been seen in follow-up by [name] and [name].  He returns the office today with tentative plans to proceed with mitral valve repair and coronary artery bypass grafting later this week.  He reports that over the past month he has continued to see gradual improvement in his exercise tolerance.  He still gets short of breath with activity but he has made further progress, and he  has now been walking more  than 3 miles at a time without significant limitations. The James [name] has been seen in consultation and counseled at length regarding the indications, risks and potential benefits of surgery.  All questions have been answered, and the James [name] provides full informed consent for the operation as described.    DETAILS OF THE OPERATIVE PROCEDURE  Preparation:  The James [name] is brought to the operating room on the above mentioned date and central monitoring was established by the anesthesia team including placement of Swan-Ganz catheter and right radial arterial line.  The arterial line was prone to dampening and subsequently a second arterial line was placed in the left brachial artery.  There was mild pulmonary hypertension.  The James [name] is placed in the supine position on the operating table.  Intravenous antibiotics are administered. General endotracheal anesthesia is induced uneventfully. A Foley catheter is placed.  Baseline transesophageal echocardiogram was performed.  Findings were notable for normal left ventricular size and systolic function.  There was an obvious flail segment (P3) of the posterior leaflet with multiple ruptured chordae tendinae.  There was severe mitral regurgitation.  Aortic valve appeared normal.  Right ventricular size and systolic function was normal.  The James [name]'s chest, abdomen, both groins, and both lower extremities are prepared and draped in a sterile manner. A time out procedure is performed.   Surgical Approach and Conduit Harvest:  A median sternotomy incision was performed and the left internal mammary artery is dissected from the chest wall and prepared for bypass grafting. The left internal mammary artery is notably good quality conduit.  Following systemic heparinization, the left internal mammary artery was transected distally noted to have excellent flow.   Extracorporeal Cardiopulmonary Bypass and Myocardial Protection:  The  right common femoral vein is cannulated using the Seldinger technique and a guidewire advanced into the right atrium using TEE guidance.  The James [name] is heparinized systemically and the right common femoral vein cannulated using a 22 Fr long femoral venous cannula.  The ascending aorta is cannulated for cardiopulmonary bypass.  Adequate heparinization is verified.   A retrograde cardioplegia cannula is placed through the right atrium into the coronary sinus.   The entire pre-bypass portion of the operation was notable for stable hemodynamics.  Cardiopulmonary bypass was begun and the surface of the heart is inspected.  A second venous cannula is placed directly into the superior vena cava.   A cardioplegia cannula is placed in the ascending aorta.  A temperature probe was placed in the interventricular septum.  The operative field is continuously flooded with carbon dioxide.  The James [name] is cooled to 28C systemic temperature.  The aortic cross clamp is applied and cardioplegia is delivered initially in an antegrade fashion through the aortic root using modified [place] cold blood cardioplegia (Kennestone blood cardioplegia protocol).   The initial cardioplegic arrest is rapid with early diastolic arrest.  Repeat doses of cardioplegia are administered at 90 minutes and every 30 minutes thereafter through the coronary sinus catheter in order to maintain completely flat electrocardiogram.  Myocardial protection was felt to be excellent.   Coronary Artery Bypass Grafting:  The distal left anterior coronary artery was grafted with the left internal mammary artery in an end-to-side fashion.  At the site of distal anastomosis the target vessel was good quality and measured approximately 1.8 mm in diameter.   Mitral Valve Repair:  A left atriotomy incision was performed through the interatrial groove and extended partially across the back wall of the left atrium after opening the  oblique sinus inferiorly.   The mitral valve is exposed using a self-retaining retractor.  Exposure was somewhat challenging due to the James [name]'s body habitus.  The mitral valve was carefully inspected.  There is fibroelastic deficiency type myxomatous degenerative disease.  The valve is relatively small.  Both the anterior and posterior leaflet are very thin and delicate.  There are multiple ruptured primary chordae tendinae from the P3 segment of the posterior leaflet.  The remainder of the valve appears essentially normal.  Interrupted 2-0 Ethibond horizontal mattress sutures were placed circumferentially around the entire mitral annulus.  These sutures will ultimately be utilized for ring annuloplasty, and at this juncture they are utilized to suspend the valve symmetrically.   Artificial neochord placement was performed using Chord-X multi-strand CV-4 Goretex pre-measured loops.  The appropriate cord length was measured from corresponding normal length primary cords from the P2 segment of the posterior leaflet. The papillary muscle suture of the Chord-X multi-strand suture was placed through the head of the posterior papillary muscle in a horizontal mattress fashion and tied over Teflon felt pledgets. Each of the three pre-measured loops were then reimplanted into the free margin of the P3 segment of the posterior leaflet.    The valve was tested with saline and appeared competent even without ring annuloplasty complete. The valve was sized to a 26 mm annuloplasty ring, based upon the transverse distance between the left and right commissures and the height of the anterior leaflet, corresponding to a size just slightly larger than the overall surface area of the anterior leaflet.  Because the valve was so small in size, decision was made to utilize posterior annuloplasty band to minimize the risk of creating significant mitral stenosis.  A Sorin Carbomedics Annuloflex annuloplasty band (size 57mm, catalog [id number], serial [id number])  was secured in place uneventfully. All sutures were secured using a Cor-knot device.  The anterior segment of the ring was excised leaving the annuloplasty band running from commissure to commissure.  The valve was tested with saline and appeared competent.   The valve is again tested with saline and appears to be perfectly competent with a broad symmetrical line of coaptation of the anterior and posterior leaflet. There is no residual leak.  Rewarming is begun.   Procedure Completion:  The atriotomy was closed using a 2-layer closure of running 3-0 Prolene suture after placing a sump drain across the mitral valve to serve as a left ventricular vent.  One final dose of warm retrograde "reanimation dose" cardioplegia was administered retrograde through the coronary sinus catheter while all air was evacuated through the aortic root.  The aortic cross clamp was removed after a total cross clamp time of 111 minutes.  Epicardial pacing wires are fixed to the right ventricular outflow tract and to the right atrial appendage. The James [name] is rewarmed to 37C temperature. The aortic and left ventricular vents are removed.  The James [name] is weaned and disconnected from cardiopulmonary bypass.  The James [name]'s rhythm at separation from bypass was sinus bradycardia.  Atrial pacing was utilized.  The James [name] was weaned from cardiopulmonary bypass without any inotropic support. Total cardiopulmonary bypass time for the operation was 136 minutes.  Followup transesophageal echocardiogram performed after separation from bypass revealed a well-seated mitral annuloplasty band and a mitral valve that was functioning normally and with trace residual mitral regurgitation.  There appeared to be some diastolic turbulence due to the Cor knot clips.  Left ventricular function was unchanged from preoperatively.  The aortic and superior  vena cava cannula were removed uneventfully. Protamine was administered to reverse the  anticoagulation. The femoral venous cannula was removed and manual pressure held on the groin for 30 minutes.  The mediastinum and pleural space were inspected for hemostasis and irrigated with saline solution. The mediastinum and the left pleural space were drained using 3 chest tubes placed through separate stab incisions inferiorly.  The soft tissues anterior to the aorta were reapproximated loosely. The sternum is closed with double strength sternal wire. The soft tissues anterior to the sternum were closed in multiple layers and the skin is closed with a running subcuticular skin closure.   The post-bypass portion of the operation was notable for stable rhythm and hemodynamics.   No blood products were administered during the operation.   Disposition:  The James [name] tolerated the procedure well.  The James [name] was transported to the surgical intensive care unit in stable condition. There were no intraoperative complications. All sponge instrument and needle counts are verified correct at completion of the operation.    Valentina Gu. Roxy Manns MD 03/24/2017 2:37 PM

## 2017-03-24 NOTE — Progress Notes (Signed)
  Echocardiogram Echocardiogram Transesophageal has been performed.  James [name] 03/24/2017, 9:45 AM

## 2017-03-24 NOTE — Progress Notes (Signed)
      [address]       [place] [address]             [phone]      Extubated, no complaints  BP (!) 94/56   Pulse 88   Temp 98.2 F (36.8 C)   Resp (!) 26   Ht 5\' 9"  (1.753 m)   Wt 225 lb (102.1 kg)   SpO2 99%   BMI 33.23 kg/m  34/18 CI= 2.4  Intake/Output Summary (Last 24 hours) at 03/24/2017 2109 Last data filed at 03/24/2017 2000 Gross per 24 hour  Intake 4426.04 ml  Output 2815 ml  Net 1611.04 ml   Doing well early postop  Remo Lipps C. [name], MD [facility] [phone]

## 2017-03-24 NOTE — Procedures (Signed)
Extubation Procedure Note  Patient Details:   Name: James [name] DOB: 04/24/1951 MRN: [id number]   Airway Documentation:     Evaluation  O2 sats: stable throughout Complications: No apparent complications Patient did tolerate procedure well. Bilateral Breath Sounds: Clear, Diminished   Yes   Patient was able to perform a NIF -25 and VC 550.  DR. [name] was ok with parameters per RN.  Patient had positive cuff leak.  Patient was extubated to a 4 L [place] and performed IS 567mL.  Patient is vitals WNL .   Carrington Clamp A 03/24/2017, 7:50 PM

## 2017-03-24 NOTE — Anesthesia Procedure Notes (Signed)
Procedure Name: Intubation Date/Time: 03/24/2017 8:35 AM Performed by: White, Amedeo Plenty, CRNA Pre-anesthesia Checklist: Patient identified, Emergency Drugs available, Suction available and Patient being monitored Patient Re-evaluated:Patient Re-evaluated prior to induction Oxygen Delivery Method: Circle System Utilized Preoxygenation: Pre-oxygenation with 100% oxygen Induction Type: IV induction Laryngoscope Size: Mac and 4 Grade View: Grade II Tube type: Oral Tube size: 8.0 mm Number of attempts: 1 Airway Equipment and Method: Stylet Placement Confirmation: ETT inserted through vocal cords under direct vision,  positive ETCO2 and breath sounds checked- equal and bilateral Secured at: 23 cm Tube secured with: Tape Dental Injury: Teeth and Oropharynx as per pre-operative assessment

## 2017-03-24 NOTE — Progress Notes (Signed)
Paged MD [name] regarding patients pre-extubation gas, failing the VC with 550. I gave Hendrickson all pareameters of NIF -25. VSS. Minimal CT output. He gave me the okay to extubate patient. Will extubate and work heavily with IS and coughing and deep breathing.  Levon Hedger, RN

## 2017-03-24 NOTE — Anesthesia Procedure Notes (Signed)
Arterial Line Insertion Start/End11/14/2018 9:00 AM, 03/24/2017 9:10 AM Performed by: Roberts Gaudy, MD, anesthesiologist  Patient location: OR. Preanesthetic checklist: patient identified, IV checked, site marked, risks and benefits discussed, surgical consent, monitors and equipment checked, pre-op evaluation and timeout performed Left, brachial was placed Catheter size: 20 G Hand hygiene performed , maximum sterile barriers used  and Seldinger technique used Allen's test indicative of satisfactory collateral circulation Attempts: 1 Procedure performed without using ultrasound guided technique. Following insertion, line sutured, dressing applied and Biopatch. Post procedure assessment: normal  Patient tolerated the procedure well with no immediate complications.

## 2017-03-24 NOTE — Anesthesia Procedure Notes (Signed)
Arterial Line Insertion Start/End11/14/2018 8:09 AM, 03/24/2017 8:18 AM Performed by: Roberts Gaudy, MD, Sammie Bench, CRNA, CRNA  Patient location: Pre-op. Preanesthetic checklist: patient identified, IV checked, site marked, risks and benefits discussed, surgical consent, monitors and equipment checked, pre-op evaluation, timeout performed and anesthesia consent Lidocaine 1% used for infiltration and patient sedated Right, radial was placed Catheter size: 20 G Hand hygiene performed  and maximum sterile barriers used  Allen's test indicative of satisfactory collateral circulation Attempts: 2 Procedure performed without using ultrasound guided technique. Ultrasound Notes:anatomy identified, needle tip was noted to be adjacent to the nerve/plexus identified and no ultrasound evidence of intravascular and/or intraneural injection Following insertion, dressing applied and Biopatch. Post procedure assessment: normal  Patient tolerated the procedure with difficulty.

## 2017-03-24 NOTE — Transfer of Care (Signed)
Immediate Anesthesia Transfer of Care Note  Patient: James [name]  Procedure(s) Performed: MITRAL VALVE REPAIR (MVR) with Sorin Carbomedics Annuloflex size 28 (N/A Chest) CORONARY ARTERY BYPASS GRAFTING (CABG)x1 using left internal mammary artery, LIMA-LAD (N/A Chest) TRANSESOPHAGEAL ECHOCARDIOGRAM (TEE) (N/A )  Patient Location: ICU  Anesthesia Type:General  Level of Consciousness: Patient remains intubated per anesthesia plan  Airway & Oxygen Therapy: Patient remains intubated per anesthesia plan and Patient placed on Ventilator (see vital sign flow sheet for setting)  Post-op Assessment: Report given to RN and Post -op Vital signs reviewed and stable  Post vital signs: Reviewed and stable  Last Vitals:  Vitals:   03/24/17 0657 03/24/17 0704  BP:  (!) 148/71  Pulse: 80   Resp: 17   Temp:  36.9 C  SpO2: 100%     Last Pain: There were no vitals filed for this visit.    Patients Stated Pain Goal: 6 (50/35/46 5681)  Complications: No apparent anesthesia complications

## 2017-03-24 NOTE — Brief Op Note (Signed)
03/24/2017  1:38 PM  PATIENT:  Elenor Quinones  66 y.o. male  PRE-OPERATIVE DIAGNOSIS:  mitral regurgitation, coronary artery diseas   POST-OPERATIVE DIAGNOSIS:  mitral regurgitation, coronary artery diseas   PROCEDURE:  Procedure(s): MITRAL VALVE REPAIR (MVR) with Sorin Carbomedics Annuloflex size 28 (N/A) CORONARY ARTERY BYPASS GRAFTING (CABG)x1 using left internal mammary artery, LIMA-LAD (N/A) TRANSESOPHAGEAL ECHOCARDIOGRAM (TEE) (N/A)  SURGEON:  Surgeon(s) and Role:    Rexene Alberts, MD - Primary  PHYSICIAN ASSISTANT:  Nicholes Rough, PA-C   ANESTHESIA:   general  EBL: TBD  BLOOD ADMINISTERED:none  DRAINS: ROUTINE   LOCAL MEDICATIONS USED:  NONE  SPECIMEN:  No Specimen  DISPOSITION OF SPECIMEN:  N/A  COUNTS:  YES  TOURNIQUET:  * No tourniquets in log *  DICTATION: .Dragon Dictation  PLAN OF CARE: Admit to inpatient   PATIENT DISPOSITION:  ICU - intubated and hemodynamically stable.   Delay start of Pharmacological VTE agent (>24hrs) due to surgical blood loss or risk of bleeding: yes

## 2017-03-24 NOTE — Anesthesia Procedure Notes (Addendum)
Central Venous Catheter Insertion Performed by: Roberts Gaudy, MD, anesthesiologist Start/End11/14/2018 8:40 AM, 03/24/2017 8:45 AM Patient location: OR. Preanesthetic checklist: patient identified, IV checked, site marked, risks and benefits discussed, surgical consent, monitors and equipment checked, pre-op evaluation and timeout performed Position: supine Catheter size: 9 Fr PA cath was placed.Procedure performed using ultrasound guided technique. Ultrasound Notes:anatomy identified, needle tip was noted to be adjacent to the nerve/plexus identified and no ultrasound evidence of intravascular and/or intraneural injection Attempts: 1 Following insertion, line sutured, dressing applied and Biopatch. Post procedure assessment: blood return through all ports, free fluid flow and no air  Patient tolerated the procedure well with no immediate complications.

## 2017-03-24 NOTE — Anesthesia Postprocedure Evaluation (Signed)
Anesthesia Post Note  Patient: JAXTYN LINVILLE  Procedure(s) Performed: MITRAL VALVE REPAIR (MVR) with Sorin Carbomedics Annuloflex size 28 (N/A Chest) CORONARY ARTERY BYPASS GRAFTING (CABG)x1 using left internal mammary artery, LIMA-LAD (N/A Chest) TRANSESOPHAGEAL ECHOCARDIOGRAM (TEE) (N/A )     Patient location during evaluation: SICU Anesthesia Type: General Pain management: pain level controlled Vital Signs Assessment: post-procedure vital signs reviewed and stable Respiratory status: patient on ventilator - see flowsheet for VS and patient remains intubated per anesthesia plan Cardiovascular status: blood pressure returned to baseline Anesthetic complications: no    Last Vitals:  Vitals:   03/24/17 1645 03/24/17 1700  BP:  112/81  Pulse: 88 88  Resp: 12 14  Temp: (!) 35.8 C (!) 35.9 C  SpO2: 100% 100%    Last Pain:  Vitals:   03/24/17 1700  TempSrc: Core (Comment)                 Brin Ruggerio COKER

## 2017-03-24 NOTE — Anesthesia Preprocedure Evaluation (Addendum)
Anesthesia Evaluation  Patient identified by MRN, date of birth, ID band Patient awake    Reviewed: Allergy & Precautions, NPO status , Patient's Chart, lab work & pertinent test results  Airway Mallampati: III  TM Distance: >3 FB Neck ROM: Limited    Dental  (+) Dental Advisory Given, Chipped, Teeth Intact,    Pulmonary sleep apnea and Continuous Positive Airway Pressure Ventilation , PE S/p RUL resection due to denocarcinoma of right lung, stage 1   breath sounds clear to auscultation       Cardiovascular hypertension, Pt. on medications + CAD and +CHF  + Valvular Problems/Murmurs MR  Rhythm:Regular Rate:Normal + Systolic murmurs    Neuro/Psych PSYCHIATRIC DISORDERS Anxiety Depression    GI/Hepatic GERD  Controlled,  Endo/Other  diabetes  Renal/GU      Musculoskeletal  (+) Arthritis ,   Abdominal (+) + obese,   Peds  Hematology   Anesthesia Other Findings Hoarseness  HLD  Reproductive/Obstetrics                             Anesthesia Physical  Anesthesia Plan  ASA: III  Anesthesia Plan: General   Post-op Pain Management:    Induction: Intravenous  PONV Risk Score and Plan: 2 and Dexamethasone, Ondansetron and Midazolam  Airway Management Planned: Oral ETT  Additional Equipment: Arterial line, CVP, PA Cath, TEE and Ultrasound Guidance Line Placement  Intra-op Plan:   Post-operative Plan: Post-operative intubation/ventilation  Informed Consent: I have reviewed the patients History and Physical, chart, labs and discussed the procedure including the risks, benefits and alternatives for the proposed anesthesia with the patient or authorized representative who has indicated his/her understanding and acceptance.   Dental advisory given  Plan Discussed with: CRNA  Anesthesia Plan Comments:         Anesthesia Quick Evaluation

## 2017-03-24 NOTE — Anesthesia Procedure Notes (Addendum)
Central Venous Catheter Insertion Performed by: Roberts Gaudy, MD, anesthesiologist Start/End11/14/2018 8:40 AM, 03/24/2017 8:50 AM Patient location: Pre-op. Preanesthetic checklist: patient identified, IV checked, site marked, risks and benefits discussed, surgical consent, monitors and equipment checked, pre-op evaluation, timeout performed and anesthesia consent Lidocaine 1% used for infiltration and patient sedated Hand hygiene performed  and maximum sterile barriers used  Catheter size: 8.5 Fr Sheath introducer Procedure performed using ultrasound guided technique. Ultrasound Notes:anatomy identified, needle tip was noted to be adjacent to the nerve/plexus identified, no ultrasound evidence of intravascular and/or intraneural injection and image(s) printed for medical record Attempts: 1 Following insertion, line sutured and dressing applied. Post procedure assessment: blood return through all ports, free fluid flow and no air  Patient tolerated the procedure well with no immediate complications.

## 2017-03-25 ENCOUNTER — Inpatient Hospital Stay (HOSPITAL_COMMUNITY): Payer: Medicare Other

## 2017-03-25 ENCOUNTER — Encounter (HOSPITAL_COMMUNITY): Payer: Self-pay | Admitting: Thoracic Surgery (Cardiothoracic Vascular Surgery)

## 2017-03-25 LAB — GLUCOSE, CAPILLARY
Glucose-Capillary: 127 mg/dL — ABNORMAL HIGH (ref 65–99)
Glucose-Capillary: 121 mg/dL — ABNORMAL HIGH (ref 65–99)
Glucose-Capillary: 119 mg/dL — ABNORMAL HIGH (ref 65–99)
Glucose-Capillary: 127 mg/dL — ABNORMAL HIGH (ref 65–99)
Glucose-Capillary: 102 mg/dL — ABNORMAL HIGH (ref 65–99)
Glucose-Capillary: 111 mg/dL — ABNORMAL HIGH (ref 65–99)
Glucose-Capillary: 141 mg/dL — ABNORMAL HIGH (ref 65–99)
Glucose-Capillary: 159 mg/dL — ABNORMAL HIGH (ref 65–99)
Glucose-Capillary: 133 mg/dL — ABNORMAL HIGH (ref 65–99)
Glucose-Capillary: 112 mg/dL — ABNORMAL HIGH (ref 65–99)
Glucose-Capillary: 57 mg/dL — ABNORMAL LOW (ref 65–99)
Glucose-Capillary: 101 mg/dL — ABNORMAL HIGH (ref 65–99)
Glucose-Capillary: 104 mg/dL — ABNORMAL HIGH (ref 65–99)
Glucose-Capillary: 126 mg/dL — ABNORMAL HIGH (ref 65–99)

## 2017-03-25 LAB — CBC
HCT: 36 % — ABNORMAL LOW (ref 39.0–52.0)
HCT: 35.5 % — ABNORMAL LOW (ref 39.0–52.0)
Hemoglobin: 12 g/dL — ABNORMAL LOW (ref 13.0–17.0)
Hemoglobin: 11.5 g/dL — ABNORMAL LOW (ref 13.0–17.0)
MCH: 29.9 pg (ref 26.0–34.0)
MCH: 29.4 pg (ref 26.0–34.0)
MCHC: 33.3 g/dL (ref 30.0–36.0)
MCHC: 32.4 g/dL (ref 30.0–36.0)
MCV: 89.6 fL (ref 78.0–100.0)
MCV: 90.8 fL (ref 78.0–100.0)
Platelets: 146 10*3/uL — ABNORMAL LOW (ref 150–400)
Platelets: 134 10*3/uL — ABNORMAL LOW (ref 150–400)
RBC: 4.02 MIL/uL — ABNORMAL LOW (ref 4.22–5.81)
RBC: 3.91 MIL/uL — ABNORMAL LOW (ref 4.22–5.81)
RDW: 14.2 % (ref 11.5–15.5)
RDW: 14.5 % (ref 11.5–15.5)
WBC: 15.9 10*3/uL — ABNORMAL HIGH (ref 4.0–10.5)
WBC: 12.1 10*3/uL — ABNORMAL HIGH (ref 4.0–10.5)

## 2017-03-25 LAB — POCT I-STAT 4, (NA,K, GLUC, HGB,HCT)
Glucose, Bld: 163 mg/dL — ABNORMAL HIGH (ref 65–99)
HCT: 38 % — ABNORMAL LOW (ref 39.0–52.0)
Hemoglobin: 12.9 g/dL — ABNORMAL LOW (ref 13.0–17.0)
Potassium: 4.3 mmol/L (ref 3.5–5.1)
Sodium: 143 mmol/L (ref 135–145)

## 2017-03-25 LAB — POCT I-STAT 7, (LYTES, BLD GAS, ICA,H+H)
Acid-Base Excess: 5 mmol/L — ABNORMAL HIGH (ref 0.0–2.0)
Bicarbonate: 31.2 mmol/L — ABNORMAL HIGH (ref 20.0–28.0)
Calcium, Ion: 1.08 mmol/L — ABNORMAL LOW (ref 1.15–1.40)
HCT: 42 % (ref 39.0–52.0)
Hemoglobin: 14.3 g/dL (ref 13.0–17.0)
O2 Saturation: 100 %
Patient temperature: 37
Potassium: 7.6 mmol/L (ref 3.5–5.1)
Sodium: 137 mmol/L (ref 135–145)
TCO2: 33 mmol/L — ABNORMAL HIGH (ref 22–32)
pCO2 arterial: 52.8 mmHg — ABNORMAL HIGH (ref 32.0–48.0)
pH, Arterial: 7.38 (ref 7.350–7.450)
pO2, Arterial: 498 mmHg — ABNORMAL HIGH (ref 83.0–108.0)

## 2017-03-25 LAB — POCT I-STAT, CHEM 8
BUN: 15 mg/dL (ref 6–20)
Calcium, Ion: 1.16 mmol/L (ref 1.15–1.40)
Chloride: 105 mmol/L (ref 101–111)
Creatinine, Ser: 0.9 mg/dL (ref 0.61–1.24)
Glucose, Bld: 147 mg/dL — ABNORMAL HIGH (ref 65–99)
HCT: 33 % — ABNORMAL LOW (ref 39.0–52.0)
Hemoglobin: 11.2 g/dL — ABNORMAL LOW (ref 13.0–17.0)
Potassium: 3.8 mmol/L (ref 3.5–5.1)
Sodium: 139 mmol/L (ref 135–145)
TCO2: 21 mmol/L — ABNORMAL LOW (ref 22–32)

## 2017-03-25 LAB — CREATININE, SERUM
Creatinine, Ser: 1.01 mg/dL (ref 0.61–1.24)
GFR calc Af Amer: >60 mL/min (ref >60)
GFR calc non Af Amer: >60 mL/min (ref >60)

## 2017-03-25 LAB — BASIC METABOLIC PANEL
Anion gap: 4 — ABNORMAL LOW (ref 5–15)
BUN: 12 mg/dL (ref 6–20)
CO2: 22 mmol/L (ref 22–32)
Calcium: 7.7 mg/dL — ABNORMAL LOW (ref 8.9–10.3)
Chloride: 113 mmol/L — ABNORMAL HIGH (ref 101–111)
Creatinine, Ser: 0.86 mg/dL (ref 0.61–1.24)
GFR calc Af Amer: >60 mL/min (ref >60)
GFR calc non Af Amer: >60 mL/min (ref >60)
Glucose, Bld: 121 mg/dL — ABNORMAL HIGH (ref 65–99)
Potassium: 4.1 mmol/L (ref 3.5–5.1)
Sodium: 139 mmol/L (ref 135–145)

## 2017-03-25 LAB — MAGNESIUM
Magnesium: 2.6 mg/dL — ABNORMAL HIGH (ref 1.7–2.4)
Magnesium: 2.3 mg/dL (ref 1.7–2.4)

## 2017-03-25 MED ORDER — ASPIRIN EC 81 MG PO TBEC
81.0000 mg | DELAYED_RELEASE_TABLET | Freq: Every day | ORAL | Status: DC
  Administered 2017-03-25 – 2017-03-30 (×6): 81 mg via ORAL
  Filled 2017-03-25 (×6): qty 1

## 2017-03-25 MED ORDER — WARFARIN SODIUM 2.5 MG PO TABS
2.5000 mg | ORAL_TABLET | Freq: Every day | ORAL | Status: DC
  Administered 2017-03-25 – 2017-03-28 (×4): 2.5 mg via ORAL
  Filled 2017-03-25 (×4): qty 1

## 2017-03-25 MED ORDER — ALPRAZOLAM 0.5 MG PO TABS
0.5000 mg | ORAL_TABLET | Freq: Four times a day (QID) | ORAL | Status: DC | PRN
  Administered 2017-03-25 – 2017-03-29 (×6): 0.5 mg via ORAL
  Filled 2017-03-25 (×6): qty 1

## 2017-03-25 MED ORDER — ATORVASTATIN CALCIUM 20 MG PO TABS
20.0000 mg | ORAL_TABLET | Freq: Every day | ORAL | Status: DC
  Administered 2017-03-26 – 2017-03-29 (×4): 20 mg via ORAL
  Filled 2017-03-25 (×4): qty 1

## 2017-03-25 MED ORDER — PATIENT'S GUIDE TO USING COUMADIN BOOK
Freq: Once | Status: AC
Start: 2017-03-25 — End: 2017-03-25
  Administered 2017-03-25: 18:00:00
  Filled 2017-03-25 (×2): qty 1

## 2017-03-25 MED ORDER — INSULIN ASPART 100 UNIT/ML ~~LOC~~ SOLN
0.0000 [IU] | SUBCUTANEOUS | Status: DC
  Administered 2017-03-25 – 2017-03-26 (×4): 2 [IU] via SUBCUTANEOUS

## 2017-03-25 MED ORDER — KETOROLAC TROMETHAMINE 15 MG/ML IJ SOLN
15.0000 mg | Freq: Four times a day (QID) | INTRAMUSCULAR | Status: AC
  Administered 2017-03-25 – 2017-03-26 (×5): 15 mg via INTRAVENOUS
  Filled 2017-03-25 (×5): qty 1

## 2017-03-25 MED ORDER — INSULIN DETEMIR 100 UNIT/ML ~~LOC~~ SOLN
20.0000 [IU] | Freq: Two times a day (BID) | SUBCUTANEOUS | Status: DC
  Administered 2017-03-25 (×2): 20 [IU] via SUBCUTANEOUS
  Filled 2017-03-25 (×3): qty 0.2

## 2017-03-25 MED ORDER — INSULIN DETEMIR 100 UNIT/ML ~~LOC~~ SOLN
20.0000 [IU] | Freq: Every day | SUBCUTANEOUS | Status: DC
  Filled 2017-03-25: qty 0.2

## 2017-03-25 MED ORDER — ENOXAPARIN SODIUM 40 MG/0.4ML ~~LOC~~ SOLN
40.0000 mg | Freq: Every day | SUBCUTANEOUS | Status: DC
  Administered 2017-03-26 – 2017-03-29 (×4): 40 mg via SUBCUTANEOUS
  Filled 2017-03-25 (×4): qty 0.4

## 2017-03-25 MED ORDER — ORAL CARE MOUTH RINSE: 15.0000 mL | Freq: Two times a day (BID) | OROMUCOSAL | Status: DC

## 2017-03-25 MED ORDER — WARFARIN - PHYSICIAN DOSING INPATIENT
Freq: Every day | Status: DC
  Administered 2017-03-26 – 2017-03-29 (×4)

## 2017-03-25 MED FILL — Potassium Chloride Inj 2 mEq/ML: INTRAVENOUS | Qty: 20 | Status: AC

## 2017-03-25 MED FILL — Heparin Sodium (Porcine) Inj 1000 Unit/ML: INTRAMUSCULAR | Qty: 30 | Status: AC

## 2017-03-25 MED FILL — Magnesium Sulfate Inj 50%: INTRAMUSCULAR | Qty: 10 | Status: AC

## 2017-03-25 NOTE — Progress Notes (Addendum)
[facility] DAILY ICU PROGRESS NOTE                   [address]            [place] [address]          [phone]   1 Day Post-Op Procedure(s) (LRB): MITRAL VALVE REPAIR (MVR) with Sorin Carbomedics Annuloflex size 28 (N/A) CORONARY ARTERY BYPASS GRAFTING (CABG)x1 using left internal mammary artery, LIMA-LAD (N/A) TRANSESOPHAGEAL ECHOCARDIOGRAM (TEE) (N/A)  Total Length of Stay:  LOS: 1 day   Subjective: Some soreness, tubes make breathing uncomfortable  Objective: Vital signs in last 24 hours: Temp:  [96.4 F (35.8 C)-99 F (37.2 C)] 98.4 F (36.9 C) (11/15 1224) Pulse Rate:  [54-95] 54 (11/15 1215) Cardiac Rhythm: Atrial paced (11/15 0800) Resp:  [9-39] 19 (11/15 1215) BP: (83-130)/(56-83) 114/79 (11/15 1215) SpO2:  [94 %-100 %] 96 % (11/15 1215) Arterial Line BP: (62-149)/(48-96) 149/90 (11/15 1215) FiO2 (%):  [40 %-50 %] 40 % (11/14 1830) Weight:  [228 lb 2.8 oz (103.5 kg)] 228 lb 2.8 oz (103.5 kg) (11/15 0500)  Filed Weights   03/24/17 0657 03/25/17 0500  Weight: 225 lb (102.1 kg) 228 lb 2.8 oz (103.5 kg)    Weight change: 3 lb 2.8 oz (1.441 kg)   Hemodynamic parameters for last 24 hours: PAP: (24-82)/(12-28) 39/20 CO:  [4.1 L/min-5.2 L/min] 5.2 L/min CI:  [1.9 L/min/m2-2.4 L/min/m2] 2.4 L/min/m2  Intake/Output from previous day: 11/14 0701 - 11/15 0700 In: 5861.8 [I.V.:4231.8; Blood:530; IV [id number]:5638] Out: 9373 [SKAJG:8115; Blood:1000; Chest Tube:490]  Intake/Output this shift: Total I/O In: 63.8 [I.V.:63.8] Out: 225 [Urine:175; Chest Tube:50]  Current Meds: Scheduled Meds: . acetaminophen  1,000 mg Oral Q6H  . aspirin EC  81 mg Oral Daily  . [START ON 03/26/2017] atorvastatin  20 mg Oral q1800  . bisacodyl  10 mg Oral Daily   Or  . bisacodyl  10 mg Rectal Daily  . chlorhexidine gluconate (MEDLINE KIT)  15 mL Mouth Rinse BID  . docusate sodium  200 mg Oral Daily  . [START ON 03/26/2017] enoxaparin (LOVENOX) injection  40 mg  Subcutaneous QHS  . insulin aspart  0-24 Units Subcutaneous Q4H  . insulin detemir  20 Units Subcutaneous BID  . insulin regular  0-10 Units Intravenous TID WC  . ketorolac  15 mg Intravenous Q6H  . mouth rinse  15 mL Mouth Rinse BID  . [START ON 03/26/2017] pantoprazole  40 mg Oral Daily  . patient's guide to using coumadin book   Does not apply Once  . sodium chloride flush  3 mL Intravenous Q12H  . warfarin  2.5 mg Oral q1800  . Warfarin - Physician Dosing Inpatient   Does not apply q1800   Continuous Infusions: . sodium chloride    . cefUROXime (ZINACEF)  IV Stopped (03/25/17 0105)  . insulin (NOVOLIN-R) infusion 3.5 Units/hr (03/25/17 1200)  . lactated ringers 20 mL/hr at 03/25/17 0600  . lactated ringers Stopped (03/24/17 2000)  . phenylephrine (NEO-SYNEPHRINE) Adult infusion 2 mcg/min (03/25/17 1200)   PRN Meds:.metoprolol tartrate, morphine injection, ondansetron (ZOFRAN) IV, oxyCODONE, sodium chloride flush, traMADol  General appearance: alert, cooperative, fatigued and no distress Heart: regular rate and rhythm and + rub with tubes in place Lungs: dim in lower fields Abdomen: mod distension, scant BS, nontender Extremities: + minor edema Wound: dressings intact , clean and dry  Lab Results: CBC: Recent Labs    03/24/17 2020 03/24/17 2027 03/25/17 0414  WBC 15.4*  --  15.9*  HGB 12.5* 11.9* 12.0*  HCT 37.6* 35.0* 36.0*  PLT 150  --  146*   BMET:  Recent Labs    03/24/17 2027 03/25/17 0414  NA 142 139  K 4.2 4.1  CL 109 113*  CO2  --  22  GLUCOSE 160* 121*  BUN 15 12  CREATININE 0.80 0.86  CALCIUM  --  7.7*    CMET: Lab Results  Component Value Date   WBC 15.9 (H) 03/25/2017   HGB 12.0 (L) 03/25/2017   HCT 36.0 (L) 03/25/2017   PLT 146 (L) 03/25/2017   GLUCOSE 121 (H) 03/25/2017   CHOL 245 (H) 06/29/2016   TRIG 198.0 (H) 06/29/2016   HDL 35.90 (L) 06/29/2016   LDLDIRECT 177.0 07/24/2015   LDLCALC 170 (H) 06/29/2016   ALT 36 03/22/2017   AST  31 03/22/2017   NA 139 03/25/2017   K 4.1 03/25/2017   CL 113 (H) 03/25/2017   CREATININE 0.86 03/25/2017   BUN 12 03/25/2017   CO2 22 03/25/2017   TSH 2.14 07/27/2016   PSA 0.83 07/27/2016   INR 1.27 03/24/2017   HGBA1C 6.5 (H) 03/22/2017   MICROALBUR <0.7 06/29/2016      PT/INR:  Recent Labs    03/24/17 1506  LABPROT 15.8*  INR 1.27   Radiology: Dg Chest Port 1 View  Result Date: 03/25/2017 CLINICAL DATA:  Status post CABG three days ago EXAM: PORTABLE CHEST 1 VIEW COMPARISON:  Chest x-ray of March 24, 2017 FINDINGS: There has been interval extubation of the trachea and esophagus. The lung volumes remain low. There is persistent bibasilar density. The cardiac silhouette is enlarged. The pulmonary vascularity is less engorged. There is no pneumothorax. The Swan-Ganz catheter tip projects in the distal aspect of the main right pulmonary artery. The mediastinal drain and 2 left lower chest tubes are in stable position. IMPRESSION: Persistent hypoinflation post extubation. No pneumothorax or significant pleural effusion. Persistent bibasilar atelectasis. No significant pulmonary vascular congestion. The remaining support tubes are in reasonable position. Electronically Signed   By: David  Martinique M.D.   On: 03/25/2017 08:42   Dg Chest Port 1 View  Result Date: 03/24/2017 CLINICAL DATA:  Atelectasis EXAM: PORTABLE CHEST 1 VIEW COMPARISON:  Two days ago FINDINGS: Endotracheal tube tip at the clavicular heads. Swan-Ganz catheter from the right with tip at the right main pulmonary artery. An orogastric tube reaches the stomach at least. Thoracic drains are present. Low volume chest with atelectasis. Normal heart size. Interval mitral valve repair. IMPRESSION: 1. Low volume chest with bilateral atelectasis. 2. Unremarkable positioning of tubes and central line. Electronically Signed   By: Monte Fantasia M.D.   On: 03/24/2017 15:30     Assessment/Plan: S/P Procedure(s) (LRB): MITRAL  VALVE REPAIR (MVR) with Sorin Carbomedics Annuloflex size 28 (N/A) CORONARY ARTERY BYPASS GRAFTING (CABG)x1 using left internal mammary artery, LIMA-LAD (N/A) TRANSESOPHAGEAL ECHOCARDIOGRAM (TEE) (N/A)  1 overall making good early progress 2 low dose neo- wean as able 3 a paced- underlying  brady in 40's 4 mod CT drainage- 210 so far today, hopefully begin to remove soon 5 renal fxn is normal, fairly good spont diuresis, will prob require some diuresis- monitor for now while on neo 6 leukocytosis, minor- systemic reactive- monitor for now clinically- he was not transfused 7 very minor expected ABL anemia currently- monitor, mild thrombocytopenia- monitor 8 CBG's 130-160's- he was on no meds preop, HGA1c is 6.5 so will need dietary management  and possibly would benefit from metformin- cont SSI 9 appears to have some degree of ileus, no N/V- monitor clinically, he is not hungry, slow with diet advance 10 INR 1.27 advance coumadin dosing slowly 11 statin start in next couple days GOLD,WAYNE E 03/25/2017 1:48 PM   I have seen and examined the patient and agree with the assessment and plan as outlined.  Doing well POD1.  Rexene Alberts, MD 03/25/2017

## 2017-03-25 NOTE — Progress Notes (Signed)
[facility] BRIEF SICU PROGRESS NOTE  1 Day Post-Op  S/P Procedure(s) (LRB): MITRAL VALVE REPAIR (MVR) with Sorin Carbomedics Annuloflex size 28 (N/A) CORONARY ARTERY BYPASS GRAFTING (CABG)x1 using left internal mammary artery, LIMA-LAD (N/A) TRANSESOPHAGEAL ECHOCARDIOGRAM (TEE) (N/A)   Stable day AAI paced w/ stable BP Breathing comfortably w/ O2 sats 98% on 1 L/min UOP adequate Labs okay  Plan: Continue current plan  Rexene Alberts, MD 03/25/2017 6:47 PM

## 2017-03-25 NOTE — Addendum Note (Signed)
Addendum  created 03/25/17 1255 by Roberts Gaudy, MD   Sign clinical note

## 2017-03-25 NOTE — Progress Notes (Signed)
Anesthesiology Follow-up:  Awake and alert, sitting in chair, neuro intact, in good spirits.  VS: T- 36.9 BP- 114/79 HR 88 (a-paced) RR- 19 O2 Sat 96% on 2L  K- 4.1 Glucose- 121 Na- 139 BUN/Cr.- 12/0.86 H/H-  12/36 Platelets- 146,000  Extubated 4 1/2 hours post-op  66 year old male, one day S/P CABG X 1, MV repair. Doing well, overall. No apparent complications.  James [name]

## 2017-03-25 NOTE — Progress Notes (Signed)
Placed pt on cpap at this time. Pt tolerating well. RT will continue to monitor.

## 2017-03-26 ENCOUNTER — Inpatient Hospital Stay (HOSPITAL_COMMUNITY): Payer: Medicare Other

## 2017-03-26 ENCOUNTER — Other Ambulatory Visit: Payer: Self-pay

## 2017-03-26 LAB — CBC
HCT: 34.5 % — ABNORMAL LOW (ref 39.0–52.0)
Hemoglobin: 11.1 g/dL — ABNORMAL LOW (ref 13.0–17.0)
MCH: 29.4 pg (ref 26.0–34.0)
MCHC: 32.2 g/dL (ref 30.0–36.0)
MCV: 91.5 fL (ref 78.0–100.0)
Platelets: 133 10*3/uL — ABNORMAL LOW (ref 150–400)
RBC: 3.77 MIL/uL — ABNORMAL LOW (ref 4.22–5.81)
RDW: 14.6 % (ref 11.5–15.5)
WBC: 11.9 10*3/uL — ABNORMAL HIGH (ref 4.0–10.5)

## 2017-03-26 LAB — GLUCOSE, CAPILLARY
Glucose-Capillary: 106 mg/dL — ABNORMAL HIGH (ref 65–99)
Glucose-Capillary: 121 mg/dL — ABNORMAL HIGH (ref 65–99)
Glucose-Capillary: 124 mg/dL — ABNORMAL HIGH (ref 65–99)
Glucose-Capillary: 125 mg/dL — ABNORMAL HIGH (ref 65–99)
Glucose-Capillary: 143 mg/dL — ABNORMAL HIGH (ref 65–99)
Glucose-Capillary: 148 mg/dL — ABNORMAL HIGH (ref 65–99)

## 2017-03-26 LAB — BASIC METABOLIC PANEL
Anion gap: 5 (ref 5–15)
BUN: 15 mg/dL (ref 6–20)
CO2: 26 mmol/L (ref 22–32)
Calcium: 7.9 mg/dL — ABNORMAL LOW (ref 8.9–10.3)
Chloride: 104 mmol/L (ref 101–111)
Creatinine, Ser: 1.06 mg/dL (ref 0.61–1.24)
GFR calc Af Amer: >60 mL/min (ref >60)
GFR calc non Af Amer: >60 mL/min (ref >60)
Glucose, Bld: 126 mg/dL — ABNORMAL HIGH (ref 65–99)
Potassium: 4.2 mmol/L (ref 3.5–5.1)
Sodium: 135 mmol/L (ref 135–145)

## 2017-03-26 LAB — PROTIME-INR
INR: 1.12
Prothrombin Time: 14.4 seconds (ref 11.4–15.2)

## 2017-03-26 MED ORDER — INSULIN ASPART 100 UNIT/ML ~~LOC~~ SOLN
0.0000 [IU] | Freq: Three times a day (TID) | SUBCUTANEOUS | Status: DC
  Administered 2017-03-26 – 2017-03-28 (×3): 2 [IU] via SUBCUTANEOUS

## 2017-03-26 MED ORDER — FUROSEMIDE 40 MG PO TABS
40.0000 mg | ORAL_TABLET | Freq: Two times a day (BID) | ORAL | Status: DC
  Administered 2017-03-27 – 2017-03-30 (×7): 40 mg via ORAL
  Filled 2017-03-26 (×7): qty 1

## 2017-03-26 MED ORDER — INSULIN DETEMIR 100 UNIT/ML ~~LOC~~ SOLN
20.0000 [IU] | Freq: Every day | SUBCUTANEOUS | Status: DC
  Administered 2017-03-26 – 2017-03-28 (×3): 20 [IU] via SUBCUTANEOUS
  Filled 2017-03-26 (×4): qty 0.2

## 2017-03-26 MED ORDER — FUROSEMIDE 10 MG/ML IJ SOLN
40.0000 mg | Freq: Two times a day (BID) | INTRAMUSCULAR | Status: AC
  Administered 2017-03-26 (×2): 40 mg via INTRAVENOUS
  Filled 2017-03-26 (×2): qty 4

## 2017-03-26 MED ORDER — SODIUM CHLORIDE 0.9 % IV SOLN: 250.0000 mL | INTRAVENOUS | Status: DC | PRN

## 2017-03-26 MED ORDER — SODIUM CHLORIDE 0.9% FLUSH: 3.0000 mL | INTRAVENOUS | Status: DC | PRN

## 2017-03-26 MED ORDER — SODIUM CHLORIDE 0.9% FLUSH
3.0000 mL | Freq: Two times a day (BID) | INTRAVENOUS | Status: DC
  Administered 2017-03-26 – 2017-03-28 (×5): 3 mL via INTRAVENOUS

## 2017-03-26 MED ORDER — MOVING RIGHT ALONG BOOK
Freq: Once | Status: AC
  Administered 2017-03-26: 12:00:00
  Filled 2017-03-26: qty 1

## 2017-03-26 MED ORDER — POTASSIUM CHLORIDE CRYS ER 20 MEQ PO TBCR
20.0000 meq | EXTENDED_RELEASE_TABLET | Freq: Two times a day (BID) | ORAL | Status: DC
  Administered 2017-03-27 – 2017-03-30 (×7): 20 meq via ORAL
  Filled 2017-03-26 (×8): qty 1

## 2017-03-26 MED ORDER — METOCLOPRAMIDE HCL 5 MG/ML IJ SOLN
10.0000 mg | Freq: Four times a day (QID) | INTRAMUSCULAR | Status: AC
  Administered 2017-03-26 – 2017-03-27 (×4): 10 mg via INTRAVENOUS
  Filled 2017-03-26 (×4): qty 2

## 2017-03-26 NOTE — Discharge Summary (Signed)
Physician Discharge Summary  Patient ID: NEEDHAM BIGGINS MRN: [id number] DOB/AGE: 1950-11-13 66 y.o.  Admit date: 03/24/2017 Discharge date: 03/30/2017  Admission Diagnoses: Patient Active Problem List   Diagnosis Date Noted  . Coronary artery disease involving native coronary artery of native heart without angina pectoris   . Non-rheumatic mitral regurgitation 07/02/2016    Discharge Diagnoses:  Principal Problem:   S/P mitral valve repair + CABG x1 Active Problems:   Obesity   Type II diabetes mellitus with manifestations (HCC)   OSA on CPAP   Non-rheumatic mitral regurgitation   Dyspnea on exertion   Coronary artery disease involving native coronary artery of native heart without angina pectoris   Chronic diastolic congestive heart failure (HCC)   S/P CABG x 1   Discharged Condition: good  HPI:   Patient is a 66 year old male with history of mitral valve prolapse with severe symptomatic primary mitral regurgitation, lung cancer, obstructive sleep apnea on CPAP, type 2 diabetes mellitus, and hyperlipidemia who returns to the office today for follow-up of mitral regurgitation with tentative plans to proceed with mitral valve repair and coronary artery bypass grafting on Wednesday, March 24, 2017. I initially had the opportunity to evaluate the patient in consultation on 08/21/2016. Prior to that transthoracic and transesophageal echocardiograms revealed the presence of mitral valve prolapse with severe mitral regurgitation and normal left ventricular systolic function. Diagnostic cardiac catheterization revealed a long segment 65% stenosis of the left anterior descending coronary artery with borderline significant hemodynamic parameters on FFR analysis. At the same time the patient was: Incidentally noted to have a 2.6 cm mass in the right upper lobe consistent with primary lung cancer, clinical stage I. The decision was made to manage the patient's cardiac disease medically over  the short-term to facilitate expedient treatment of the newly discovered lung cancer. The patient underwent uncomplicated video assisted right upper lobectomy with mediastinal lymph node dissection by Dr. [name] on 09/02/2016. Final pathology was consistent with moderately differentiated adenocarcinoma with negative surgical margins and no evidence for visceral pleural invasion nor lymphovascular invasion. All lymph nodes were negative, consistent with pathologic stage IA(T1cN0 M0).The patient was seen in consultation by Dr. Inda Merlin at the cancer center. Adjuvant chemotherapy was not recommended. The patient has overall done fairly well, although he was readmitted to the hospital in early June with shortness of breath that was felt to be primarily related to volume overload. CT angiogram at that time revealed what was felt to be a very small pulmonary embolus in the right lower lobe.Xarelto was started at that time.  I saw him in follow-up in early October at which time we discussed the possibility of proceeding with elective mitral valve repair and coronary artery bypass grafting in the near future.  He underwent follow-up transthoracic echocardiogram and pulmonary function testing, and he has recently been seen in follow-up by [name] and [name].  He returns the office today with tentative plans to proceed with mitral valve repair and coronary artery bypass grafting later this week.  He reports that over the past month he has continued to see gradual improvement in his exercise tolerance.  He still gets short of breath with activity but he has made further progress, and he has now been walking more than 3 miles at a time without significant limitations.  Overall he feels well and he looks forward to proceeding with surgery as previously scheduled.  He stopped taking Xarelto last week.  The remainder of his review of systems  is unremarkable.   Hospital Course:  On 03/24/2017 Mr. Cassedy  underwent a mitral valve repair and a CABG x 1 with Dr. Roxy Manns. He tolerated the procedure well and was transferred to the ICU for continued care. He was extubated in a timely manner. POD 1 he was progressing well. He remained on low-dose Neo and was paced due to sinus brady. He did have some expected post-op anemia which we watched closely. His blood glucose level was well controlled. POD 2 we discontinued his chest tubes.  He remained hemodynamically stable off of all drips.  We continued to wean him off oxygen.  His INR continued to decrease with Coumadin 2.5 mg daily. As a result, Coumadin was increased to 5 mg daily. His latest PT and INR are 1.03 .  He was deemed stable to transfer to the telemetry unit for continued care on 03/28/2017. He has been tolerating a diet. He was given Lactulose to help with bowel movement. He has been ambulating on room air. His HGA1C is 6.5 so he is borderline diabetic. Schedules Insulin was stopped.  We talked about changes in diet (and will include in his discharge instructions), follow up with medical doctor for further surveillance and he/she to determine if patient needs to start oral medication. Epicardial pacing wires have been removed. Chest tube sutures will be removed the day of discharge. He is felt surgically stable for discharge today.  Consults: None  Significant Diagnostic Studies:  CLINICAL DATA:  66 year old male postoperative day two mitral valve repair and CABG.  EXAM: PORTABLE CHEST 1 VIEW  COMPARISON:  03/25/2017 and earlier.  FINDINGS: Portable AP upright view at 0622 hours. Swan-Ganz catheter has been removed. Right IJ introducer sheath remains. Left chest tube and mediastinal tubes remain.  No pneumothorax or pulmonary edema. Continued somewhat low lung volumes. Elevation of the right hemidiaphragm appears chronic. Stable mild basilar streaky opacity. Mediastinal contours remain normal. Stable right axillary or chest wall surgical  clips.  IMPRESSION: 1. Swan-Ganz catheter removed.  Otherwise stable lines and tubes. 2. No pneumothorax or pulmonary edema. Stable lung base atelectasis.   Electronically Signed   By: Genevie Ann M.D.   On: 03/26/2017 08:30  Treatments: CARDIOTHORACIC SURGERY OPERATIVE NOTE  Date of Procedure:                03/24/2017  Preoperative Diagnosis:        Severe Mitral Regurgitation  Single-vessel Coronary Artery Disease  Postoperative Diagnosis:    Same  Procedure:        Mitral Valve Repair             Complex valvuloplasty including artificial Gore-tex neochord placement x6             Sorin Carbomedics Annuloflex posterior annuloplasty band (size 25mm, catalog #[id number], serial [id number])   Coronary Artery Bypass Grafting x 1              Left Internal Mammary Artery to Distal Left Anterior Descending Coronary Artery  Surgeon:        Valentina Gu. 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 rate 20, height 5\' 9"  (1.753 m), weight 226 lb 3.1 oz (102.6 kg), SpO2 99 %.   Cardiovascular: RRR, no murmur Pulmonary: Slightly diminished at bases Abdomen: Soft, non tender, bowel sounds present. Extremities: Pre tibial pitting bilateral lower extremity edema. Wounds: Clean and dry.  No erythema or signs of infection.   Disposition: 01-Home or Self Care  Discharge Instructions    Amb Referral to Cardiac  Rehabilitation   Complete by:  As directed    Diagnosis:   CABG Valve Repair     Valve:  Mitral   CABG X ___:  1     Allergies as of 03/30/2017      Reactions   Symbicort [budesonide-formoterol Fumarate] Other (See Comments), Cough   Chest pain, wheezing    Amoxicillin Itching, Other (See Comments)   Has patient had a PCN reaction causing immediate rash, facial/tongue/throat swelling, SOB or lightheadedness with hypotension: No Has patient had a PCN reaction causing severe rash involving mucus membranes or skin necrosis: No Has patient had a PCN reaction that required hospitalization No Has patient had a PCN reaction occurring within the last 10 years: No If all of the above answers are "NO", then may proceed with Cephalosporin use.      Medication List    STOP taking these medications   losartan 25 MG tablet Commonly known as:  COZAAR   multivitamin tablet   rivaroxaban 20 MG Tabs tablet Commonly known as:  XARELTO     TAKE these medications   acetaminophen 325 MG tablet Commonly known as:  TYLENOL Take 650 mg by mouth every 6 (six) hours as needed for moderate pain or headache.   ALLERGY RELIEF EYE DROPS OP Apply 2 drops to eye as needed (for allergies).   aspirin EC 81 MG tablet Take 1 tablet (81 mg total) by mouth daily.   atorvastatin 20 MG tablet Commonly known as:  LIPITOR Take 1 tablet (20 mg total) by mouth daily.   Fish Oil 500 MG Caps Take 500 mg by mouth daily.   FUNGI-NAIL TOE & FOOT EX Apply 1 application topically daily as needed (for nail).   furosemide 40 MG tablet Commonly known as:  LASIX TAKE 1 TABLET BY MOUTH EVERY DAY What changed:    how much to take  how to take this  when to take this   metoprolol tartrate 25 MG tablet Commonly known as:  LOPRESSOR Take 0.5 tablets (12.5 mg total) 2 (two) times daily by mouth.   potassium chloride SA 20 MEQ tablet Commonly known as:  K-DUR,KLOR-CON Take 1 tablet (20 mEq total) by mouth  daily.   psyllium 58.6 % packet Commonly known as:  METAMUCIL Take 1 packet by mouth daily.   sodium chloride 0.65 % Soln nasal spray Commonly known as:  OCEAN Place 2 sprays into both nostrils at bedtime.   traMADol 50 MG tablet Commonly known as:  ULTRAM Take 50 mg by mouth every 4-6 hours PRN severe pain. What changed:    how much to take  how to take this  when to take this  reasons to take this  additional instructions   traMADol 50 MG tablet Commonly known as:  ULTRAM Take 1-2 tablets (50-100 mg total) every 4 (four) hours as needed by mouth for moderate pain. What changed:  You were already taking a medication  with the same name, and this prescription was added. Make sure you understand how and when to take each.   warfarin 5 MG tablet Commonly known as:  COUMADIN Take 1 tablet (5 mg total) daily at 6 PM by mouth.      Follow-up Information    [name], MD. Call in 1 day(s).   Specialty:  Internal Medicine Why:  for a follow up appointment regarding further surveillance of HGA1C 6.5 (borderline diabetes) Contact information: [address] [address] [phone]        Rexene Alberts, MD Follow up.   Specialty:  Cardiothoracic Surgery Why:  Your appointment is on 04/19/2017 at 1:00pm. Please arrive at 12:30pm for a chest xray located at [facility] which is on the first floor of our building.  Contact information: [address] [place] [address] [phone]        Almyra Deforest, [place] Follow up.   Specialties:  Cardiology, Radiology Why:  Almyra Deforest, PA-C 12/5 @8am  ([facility]) Contact information: [place] [address] [phone]        [facility] [employer]. Call on 03/31/2017.   Specialty:  Cardiology Why:  Appointment is at 10:00 Contact information: [address] [place] [phone]          The patient has been discharged on:   1.Beta Blocker:  Yes [ x  ]                              No   [   ]                              If No, reason:  2.Ace Inhibitor/ARB: Yes [   ]                                     No  [ x   ]                                     If No, reason: labile BP  3.Statin:   Yes [ x  ]                  No  [   ]                  If No, reason:  4.Ecasa:  Yes  [ x  ]                  No   [   ]                  If No, reason:   Signed: Erin Barrett PA-C 03/30/2017, 8:52 AM

## 2017-03-26 NOTE — Discharge Instructions (Addendum)
Information on my medicine - Coumadin   (Warfarin)  This medication education was reviewed with me or my healthcare representative as part of my discharge preparation.  The pharmacist that spoke with me during my hospital stay was:  Dareen Piano, [facility]  Why was Coumadin prescribed for you? Coumadin was prescribed for you because you have a blood clot or a medical condition that can cause an increased risk of forming blood clots. Blood clots can cause serious health problems by blocking the flow of blood to the heart, lung, or brain. Coumadin can prevent harmful blood clots from forming. As a reminder your indication for Coumadin is:   Blood Clot Prevention After Heart Valve Surgery  What test will check on my response to Coumadin? While on Coumadin (warfarin) you will need to have an INR test regularly to ensure that your dose is keeping you in the desired range. The INR (international normalized ratio) number is calculated from the result of the laboratory test called prothrombin time (PT).  If an INR APPOINTMENT HAS NOT ALREADY BEEN MADE FOR YOU please schedule an appointment to have this lab work done by your health care provider within 7 days. Your INR goal is usually a number between:  2 to 3 or your provider may give you a more narrow range like 2-2.5.  Ask your health care provider during an office visit what your goal INR is.  What  do you need to  know  About  COUMADIN? Take Coumadin (warfarin) exactly as prescribed by your healthcare provider about the same time each day.  DO NOT stop taking without talking to the doctor who prescribed the medication.  Stopping without other blood clot prevention medication to take the place of Coumadin may increase your risk of developing a new clot or stroke.  Get refills before you run out.  What do you do if you miss a dose? If you miss a dose, take it as soon as you remember on the same day then continue your regularly scheduled regimen the  next day.  Do not take two doses of Coumadin at the same time.  Important Safety Information A possible side effect of Coumadin (Warfarin) is an increased risk of bleeding. You should call your healthcare provider right away if you experience any of the following: ? Bleeding from an injury or your nose that does not stop. ? Unusual colored urine (red or dark brown) or unusual colored stools (red or black). ? Unusual bruising for unknown reasons. ? A serious fall or if you hit your head (even if there is no bleeding).  Some foods or medicines interact with Coumadin (warfarin) and might alter your response to warfarin. To help avoid this: ? Eat a balanced diet, maintaining a consistent amount of Vitamin K. ? Notify your provider about major diet changes you plan to make. ? Avoid alcohol or limit your intake to 1 drink for women and 2 drinks for men per day. (1 drink is 5 oz. wine, 12 oz. beer, or 1.5 oz. liquor.)  Make sure that ANY health care provider who prescribes medication for you knows that you are taking Coumadin (warfarin).  Also make sure the healthcare provider who is monitoring your Coumadin knows when you have started a new medication including herbals and non-prescription products.  Coumadin (Warfarin)  Major Drug Interactions  Increased Warfarin Effect Decreased Warfarin Effect  Alcohol (large quantities) Antibiotics (esp. Septra/Bactrim, Flagyl, Cipro) Amiodarone (Cordarone) Aspirin (ASA) Cimetidine (Tagamet) Megestrol (Megace) NSAIDs (  ibuprofen, naproxen, etc.) Piroxicam (Feldene) Propafenone (Rythmol SR) Propranolol (Inderal) Isoniazid (INH) Posaconazole (Noxafil) Barbiturates (Phenobarbital) Carbamazepine (Tegretol) Chlordiazepoxide (Librium) Cholestyramine (Questran) Griseofulvin Oral Contraceptives Rifampin Sucralfate (Carafate) Vitamin K   Coumadin (Warfarin) Major Herbal Interactions  Increased Warfarin Effect Decreased Warfarin Effect   Garlic Ginseng Ginkgo biloba Coenzyme Q10 Green tea St. Johns wort    Coumadin (Warfarin) FOOD Interactions  Eat a consistent number of servings per week of foods HIGH in Vitamin K (1 serving =  cup)  Collards (cooked, or boiled & drained) Kale (cooked, or boiled & drained) Mustard greens (cooked, or boiled & drained) Parsley *serving size only =  cup Spinach (cooked, or boiled & drained) Swiss chard (cooked, or boiled & drained) Turnip greens (cooked, or boiled & drained)  Eat a consistent number of servings per week of foods MEDIUM-HIGH in Vitamin K (1 serving = 1 cup)  Asparagus (cooked, or boiled & drained) Broccoli (cooked, boiled & drained, or raw & chopped) Brussel sprouts (cooked, or boiled & drained) *serving size only =  cup Lettuce, raw (green leaf, endive, romaine) Spinach, raw Turnip greens, raw & chopped   These websites have more information on Coumadin (warfarin):  FailFactory.se; VeganReport.com.au;  Prediabetes Eating Plan Prediabetes--also called impaired glucose tolerance or impaired fasting glucose--is a condition that causes blood sugar (blood glucose) levels to be higher than normal. Following a healthy diet can help to keep prediabetes under control. It can also help to lower the risk of type 2 diabetes and heart disease, which are increased in people who have prediabetes. Along with regular exercise, a healthy diet:  Promotes weight loss.  Helps to control blood sugar levels.  Helps to improve the way that the body uses insulin.  What do I need to know about this eating plan?  Use the glycemic index (GI) to plan your meals. The index tells you how quickly a food will raise your blood sugar. Choose low-GI foods. These foods take a longer time to raise blood sugar.  Pay close attention to the amount of carbohydrates in the food that you eat. Carbohydrates increase blood sugar levels.  Keep track of how many calories you take  in. Eating the right amount of calories will help you to achieve a healthy weight. Losing about 7 percent of your starting weight can help to prevent type 2 diabetes.  You may want to follow a Mediterranean diet. This diet includes a lot of vegetables, lean meats or fish, whole grains, fruits, and healthy oils and fats. What foods can I eat? Grains Whole grains, such as whole-wheat or whole-grain breads, crackers, cereals, and pasta. Unsweetened oatmeal. Bulgur. Barley. Quinoa. Brown rice. Corn or whole-wheat flour tortillas or taco shells. Vegetables Lettuce. Spinach. Peas. Beets. Cauliflower. Cabbage. Broccoli. Carrots. Tomatoes. Squash. Eggplant. Herbs. Peppers. Onions. Cucumbers. Brussels sprouts. Fruits Berries. Bananas. Apples. Oranges. Grapes. Papaya. Mango. Pomegranate. Kiwi. Grapefruit. Cherries. Meats and Other Protein Sources Seafood. Lean meats, such as chicken and [place] or lean cuts of pork and beef. Tofu. Eggs. Nuts. Beans. Dairy Low-fat or fat-free dairy products, such as yogurt, cottage cheese, and cheese. Beverages Water. Tea. Coffee. Sugar-free or diet soda. Seltzer water. Milk. Milk alternatives, such as soy or almond milk. Condiments Mustard. Relish. Low-fat, low-sugar ketchup. Low-fat, low-sugar barbecue sauce. Low-fat or fat-free mayonnaise. Sweets and Desserts Sugar-free or low-fat pudding. Sugar-free or low-fat ice cream and other frozen treats. Fats and Oils Avocado. Walnuts. Olive oil. The items listed above may not be a complete list of recommended foods  or beverages. Contact your dietitian for more options. What foods are not recommended? Grains Refined white flour and flour products, such as bread, pasta, snack foods, and cereals. Beverages Sweetened drinks, such as sweet iced tea and soda. Sweets and Desserts Baked goods, such as cake, cupcakes, pastries, cookies, and cheesecake. The items listed above may not be a complete list of foods and beverages to  avoid. Contact your dietitian for more information. This information is not intended to replace advice given to you by your health care provider. Make sure you discuss any questions you have with your health care provider. Document Released: 09/11/2014 Document Revised: 10/03/2015 Document Reviewed: 05/23/2014 Elsevier Interactive Patient Education  2017 [employer] Inc.  Coronary Artery Bypass Grafting, Care After These instructions give you information on caring for yourself after your procedure. Your doctor may also give you more specific instructions. Call your doctor if you have any problems or questions after your procedure. Follow these instructions at home:  Only take medicine as told by your doctor. Take medicines exactly as told. Do not stop taking medicines or start any new medicines without talking to your doctor first.  Take your pulse as told by your doctor.  Do deep breathing as told by your doctor. Use your breathing device (incentive spirometer), if given, to practice deep breathing several times a day. Support your chest with a pillow or your arms when you take deep breaths or cough.  Keep the area clean, dry, and protected where the surgery cuts (incisions) were made. Remove bandages (dressings) only as told by your doctor. If strips were applied to surgical area, do not take them off. They fall off on their own.  Check the surgery area daily for puffiness (swelling), redness, or leaking fluid.  If surgery cuts were made in your legs: ? Avoid crossing your legs. ? Avoid sitting for long periods of time. Change positions every 30 minutes. ? Raise your legs when you are sitting. Place them on pillows.  Wear stockings that help keep blood clots from forming in your legs (compression stockings).  Only take sponge baths until your doctor says it is okay to take showers. Pat the surgery area dry. Do not rub the surgery area with a washcloth or towel. Do not bathe, swim, or use a  hot tub until your doctor says it is okay.  Eat foods that are high in fiber. These include raw fruits and vegetables, whole grains, beans, and nuts. Choose lean meats. Avoid canned, processed, and fried foods.  Drink enough fluids to keep your pee (urine) clear or pale yellow.  Weigh yourself every day.  Rest and limit activity as told by your doctor. You may be told to: ? Stop any activity if you have chest pain, shortness of breath, changes in heartbeat, or dizziness. Get help right away if this happens. ? Move around often for short amounts of time or take short walks as told by your doctor. Gradually become more active. You may need help to strengthen your muscles and build endurance. ? Avoid lifting, pushing, or pulling anything heavier than 10 pounds (4.5 kg) for at least 6 weeks after surgery.  Do not drive until your doctor says it is okay.  Ask your doctor when you can go back to work.  Ask your doctor when you can begin sexual activity again.  Follow up with your doctor as told. Contact a doctor if:  You have puffiness, redness, more pain, or fluid draining from the incision site.  You have a fever.  You have puffiness in your ankles or legs.  You have pain in your legs.  You gain 2 or more pounds (0.9 kg) a day.  You feel sick to your stomach (nauseous) or throw up (vomit).  You have watery poop (diarrhea). Get help right away if:  You have chest pain that goes to your jaw or arms.  You have shortness of breath.  You have a fast or irregular heartbeat.  You notice a "clicking" in your breastbone when you move.  You have numbness or weakness in your arms or legs.  You feel dizzy or light-headed. This information is not intended to replace advice given to you by your health care provider. Make sure you discuss any questions you have with your health care provider. Document Released: 05/02/2013 Document Revised: 10/03/2015 Document Reviewed:  10/04/2012 Elsevier Interactive Patient Education  2017 [employer].

## 2017-03-26 NOTE — Progress Notes (Addendum)
[facility] DAILY ICU PROGRESS NOTE                   [address]            [place] [address]          [phone]   2 Days Post-Op Procedure(s) (LRB): MITRAL VALVE REPAIR (MVR) with Sorin Carbomedics Annuloflex size 28 (N/A) CORONARY ARTERY BYPASS GRAFTING (CABG)x1 using left internal mammary artery, LIMA-LAD (N/A) TRANSESOPHAGEAL ECHOCARDIOGRAM (TEE) (N/A)  Total Length of Stay:  LOS: 2 days   Subjective: In some pain since he just got his chest tubes removed  Objective: Vital signs in last 24 hours: Temp:  [97.9 F (36.6 C)-99.6 F (37.6 C)] 97.9 F (36.6 C) (11/16 0813) Pulse Rate:  [54-92] 68 (11/16 1000) Cardiac Rhythm: Normal sinus rhythm (11/16 0800) Resp:  [13-33] 16 (11/16 1000) BP: (89-126)/(51-79) 112/58 (11/16 1000) SpO2:  [95 %-100 %] 95 % (11/16 1000) Arterial Line BP: (92-149)/(48-90) 116/61 (11/15 1800) Weight:  [238 lb 5.1 oz (108.1 kg)] 238 lb 5.1 oz (108.1 kg) (11/16 0500)  Filed Weights   03/24/17 0657 03/25/17 0500 03/26/17 0500  Weight: 225 lb (102.1 kg) 228 lb 2.8 oz (103.5 kg) 238 lb 5.1 oz (108.1 kg)    Weight change: 10 lb 2.3 oz (4.6 kg)      Intake/Output from previous day: 11/15 0701 - 11/16 0700 In: 337.5 [I.V.:237.5; IV Piggyback:100] Out: 7035 [Urine:1285; Chest Tube:390]  Intake/Output this shift: Total I/O In: 440 [P.O.:360; I.V.:80] Out: 705 [Urine:675; Chest Tube:30]  Current Meds: Scheduled Meds: . acetaminophen  1,000 mg Oral Q6H  . aspirin EC  81 mg Oral Daily  . atorvastatin  20 mg Oral q1800  . bisacodyl  10 mg Oral Daily   Or  . bisacodyl  10 mg Rectal Daily  . docusate sodium  200 mg Oral Daily  . enoxaparin (LOVENOX) injection  40 mg Subcutaneous QHS  . furosemide  40 mg Intravenous BID  . [START ON 03/27/2017] furosemide  40 mg Oral BID  . insulin aspart  0-24 Units Subcutaneous Q4H  . insulin detemir  20 Units Subcutaneous Daily  . ketorolac  15 mg Intravenous Q6H  . mouth rinse  15 mL Mouth  Rinse BID  . metoCLOPramide (REGLAN) injection  10 mg Intravenous Q6H  . moving right along book   Does not apply Once  . pantoprazole  40 mg Oral Daily  . [START ON 03/27/2017] potassium chloride  20 mEq Oral BID  . sodium chloride flush  3 mL Intravenous Q12H  . sodium chloride flush  3 mL Intravenous Q12H  . warfarin  2.5 mg Oral q1800  . Warfarin - Physician Dosing Inpatient   Does not apply q1800   Continuous Infusions: . sodium chloride    . sodium chloride    . cefUROXime (ZINACEF)  IV Stopped (03/26/17 0142)  . lactated ringers 20 mL/hr at 03/26/17 1000  . lactated ringers Stopped (03/24/17 2000)   PRN Meds:.sodium chloride, ALPRAZolam, metoprolol tartrate, morphine injection, ondansetron (ZOFRAN) IV, oxyCODONE, sodium chloride flush, sodium chloride flush, traMADol  General appearance: alert, cooperative and no distress Heart: regular rate and rhythm, S1, S2 normal, no murmur, click, rub or gallop Lungs: clear to auscultation bilaterally Abdomen: soft, non-tender; bowel sounds normal; no masses,  no organomegaly Extremities: ++ edema in upper and lower extremity Wound: clean and dry covered with a sterile dressing  Lab Results: CBC: Recent Labs    03/25/17 1729  03/25/17 1736 03/26/17 0333  WBC 12.1*  --  11.9*  HGB 11.5* 11.2* 11.1*  HCT 35.5* 33.0* 34.5*  PLT 134*  --  133*   BMET:  Recent Labs    03/25/17 0414  03/25/17 1736 03/26/17 0333  NA 139  --  139 135  K 4.1  --  3.8 4.2  CL 113*  --  105 104  CO2 22  --   --  26  GLUCOSE 121*  --  147* 126*  BUN 12  --  15 15  CREATININE 0.86   < > 0.90 1.06  CALCIUM 7.7*  --   --  7.9*   < > = values in this interval not displayed.    CMET: Lab Results  Component Value Date   WBC 11.9 (H) 03/26/2017   HGB 11.1 (L) 03/26/2017   HCT 34.5 (L) 03/26/2017   PLT 133 (L) 03/26/2017   GLUCOSE 126 (H) 03/26/2017   CHOL 245 (H) 06/29/2016   TRIG 198.0 (H) 06/29/2016   HDL 35.90 (L) 06/29/2016   LDLDIRECT  177.0 07/24/2015   LDLCALC 170 (H) 06/29/2016   ALT 36 03/22/2017   AST 31 03/22/2017   NA 135 03/26/2017   K 4.2 03/26/2017   CL 104 03/26/2017   CREATININE 1.06 03/26/2017   BUN 15 03/26/2017   CO2 26 03/26/2017   TSH 2.14 07/27/2016   PSA 0.83 07/27/2016   INR 1.12 03/26/2017   HGBA1C 6.5 (H) 03/22/2017   MICROALBUR <0.7 06/29/2016      PT/INR:  Recent Labs    03/26/17 0333  LABPROT 14.4  INR 1.12   Radiology: Dg Chest Port 1 View  Result Date: 03/26/2017 CLINICAL DATA:  66 year old male postoperative day two mitral valve repair and CABG. EXAM: PORTABLE CHEST 1 VIEW COMPARISON:  03/25/2017 and earlier. FINDINGS: Portable AP upright view at 0622 hours. Swan-Ganz catheter has been removed. Right IJ introducer sheath remains. Left chest tube and mediastinal tubes remain. No pneumothorax or pulmonary edema. Continued somewhat low lung volumes. Elevation of the right hemidiaphragm appears chronic. Stable mild basilar streaky opacity. Mediastinal contours remain normal. Stable right axillary or chest wall surgical clips. IMPRESSION: 1. Swan-Ganz catheter removed.  Otherwise stable lines and tubes. 2. No pneumothorax or pulmonary edema. Stable lung base atelectasis. Electronically Signed   By: Genevie Ann M.D.   On: 03/26/2017 08:30     Assessment/Plan: S/P Procedure(s) (LRB): MITRAL VALVE REPAIR (MVR) with Sorin Carbomedics Annuloflex size 28 (N/A) CORONARY ARTERY BYPASS GRAFTING (CABG)x1 using left internal mammary artery, LIMA-LAD (N/A) TRANSESOPHAGEAL ECHOCARDIOGRAM (TEE) (N/A)  1. CV-hemodynamically stable, off all drips. HR in the 70s NSR.  2. Pulm-tolerating room air. CPAP at night. Chest tube output 116mL/12 hours, now removed. Continue incentive spirometer use.  3. Renal-creatinine 1.06, electrolytes stable. Making good urine. Weight remains 13 lbs over baseline. Lasix 40mg  IV BID today 4. H and H stable, minor acute blood loss anemia 5. Endo-CBGs well controlled 6.  Low-dose coumadin 2.5mg  with INR 1.12 7. GI-has been tolerating clear liquids, belching but no flatus. ++BS 8. ID-leukocytosis, likly reactive but will monitor. Use IS hourly.  Plan: Possible transfer to 4E later today.  Elgie Collard 03/26/2017 11:07 AM    I have seen and examined the patient and agree with the assessment and plan as outlined.  Making good progress.  Maintaining NSR w/ stable BP off Neo drip.  Slept well last night.  Will add low dose beta blocker tomorrow if HR  and BP will tolerate.  Restart ARB in 2-3 days if BP will tolerate.  Transfer stepdown.  Rexene Alberts, MD 03/26/2017

## 2017-03-26 NOTE — Care Management Note (Signed)
Case Management Note  Patient Details  Name: James [name] MRN: [id number] Date of Birth: [birth year]  Subjective/Objective:  From home with wife, who is able to assist him 24/7, he has PCP and medication ast, he is POD 1 CABG, MVR.  He uses a cane at home , but not all the time.                     Action/Plan: NCM will follow for dc needs.  Expected Discharge Date:                  Expected Discharge Plan:  Home/Self Care  In-House Referral:     Discharge planning Services  CM Consult  Post Acute Care Choice:    Choice offered to:     DME Arranged:    DME Agency:     HH Arranged:    HH Agency:     Status of Service:  In process, will continue to follow  If discussed at Long Length of Stay Meetings, dates discussed:    Additional Comments:  Zenon Mayo, RN 03/26/2017, 10:31 AM

## 2017-03-27 ENCOUNTER — Inpatient Hospital Stay (HOSPITAL_COMMUNITY): Payer: Medicare Other

## 2017-03-27 LAB — BASIC METABOLIC PANEL
Anion gap: 8 (ref 5–15)
BUN: 12 mg/dL (ref 6–20)
CO2: 27 mmol/L (ref 22–32)
Calcium: 8.1 mg/dL — ABNORMAL LOW (ref 8.9–10.3)
Chloride: 101 mmol/L (ref 101–111)
Creatinine, Ser: 1 mg/dL (ref 0.61–1.24)
GFR calc Af Amer: >60 mL/min (ref >60)
GFR calc non Af Amer: >60 mL/min (ref >60)
Glucose, Bld: 104 mg/dL — ABNORMAL HIGH (ref 65–99)
Potassium: 3.9 mmol/L (ref 3.5–5.1)
Sodium: 136 mmol/L (ref 135–145)

## 2017-03-27 LAB — CBC
HCT: 34.3 % — ABNORMAL LOW (ref 39.0–52.0)
Hemoglobin: 11 g/dL — ABNORMAL LOW (ref 13.0–17.0)
MCH: 29.2 pg (ref 26.0–34.0)
MCHC: 32.1 g/dL (ref 30.0–36.0)
MCV: 91 fL (ref 78.0–100.0)
Platelets: 144 10*3/uL — ABNORMAL LOW (ref 150–400)
RBC: 3.77 MIL/uL — ABNORMAL LOW (ref 4.22–5.81)
RDW: 14.3 % (ref 11.5–15.5)
WBC: 12.6 10*3/uL — ABNORMAL HIGH (ref 4.0–10.5)

## 2017-03-27 LAB — GLUCOSE, CAPILLARY
Glucose-Capillary: 100 mg/dL — ABNORMAL HIGH (ref 65–99)
Glucose-Capillary: 135 mg/dL — ABNORMAL HIGH (ref 65–99)
Glucose-Capillary: 102 mg/dL — ABNORMAL HIGH (ref 65–99)

## 2017-03-27 LAB — PROTIME-INR
INR: 1.17
Prothrombin Time: 14.8 seconds (ref 11.4–15.2)

## 2017-03-27 MED ORDER — GLUCERNA SHAKE PO LIQD
237.0000 mL | Freq: Three times a day (TID) | ORAL | Status: DC
  Administered 2017-03-27: 237 mL via ORAL

## 2017-03-27 MED ORDER — METOPROLOL TARTRATE 12.5 MG HALF TABLET
12.5000 mg | ORAL_TABLET | Freq: Two times a day (BID) | ORAL | Status: DC
  Administered 2017-03-27 – 2017-03-29 (×6): 12.5 mg via ORAL
  Filled 2017-03-27 (×6): qty 1

## 2017-03-27 NOTE — Progress Notes (Addendum)
CARDIAC REHAB PHASE I   PRE:  Rate/Rhythm: 74 SR  BP:  Supine:   Sitting: 136/68  Standing:    SaO2: 97%RA  MODE:  Ambulation: 430 ft   POST:  Rate/Rhythm: 96 SR  BP:  Supine:   Sitting: 144/66  Standing:    SaO2: 96%RA whole walk 0931-1030 Pt walked 430 ft on RA with sats registering 96% whole walk. Some DOE noted. Encouraged pursed lip breathing. Took several standing breaks. In a lot of incisional pain and had gotten pain med. Education completed with pt and wife who voiced understanding. Stressed sternal precautions, IS. Gave heart healthy and diabetic diets. Discussed ex ed and CRP 2. Referring to [facility] program. Assisted to bathroom and to recliner. Wrote down how to view discharge video.   Graylon Good, RN BSN  03/27/2017 10:25 AM

## 2017-03-27 NOTE — Addendum Note (Signed)
Addendum  created 03/27/17 2330 by Roberts Gaudy, MD   Image imported, Intraprocedure Blocks edited, Sign clinical note

## 2017-03-27 NOTE — Progress Notes (Addendum)
      [address]       [place] [address]             [phone]      3 Days Post-Op Procedure(s) (LRB): MITRAL VALVE REPAIR (MVR) with Sorin Carbomedics Annuloflex size 28 (N/A) CORONARY ARTERY BYPASS GRAFTING (CABG)x1 using left internal mammary artery, LIMA-LAD (N/A) TRANSESOPHAGEAL ECHOCARDIOGRAM (TEE) (N/A) Subjective: Still not eating a lot. Having nausea in the afternoon. Zofran helps.   Objective: Vital signs in last 24 hours: Temp:  [98 F (36.7 C)-99.6 F (37.6 C)] 98.9 F (37.2 C) (11/17 0728) Pulse Rate:  [63-75] 66 (11/17 0728) Cardiac Rhythm: Junctional rhythm (11/16 1913) Resp:  [10-29] 26 (11/17 0728) BP: (100-142)/(58-78) 124/61 (11/17 0728) SpO2:  [93 %-100 %] 97 % (11/17 0728) Weight:  [235 lb 9.6 oz (106.9 kg)] 235 lb 9.6 oz (106.9 kg) (11/17 0624)  Hemodynamic parameters for last 24 hours:    Intake/Output from previous day: 11/16 0701 - 11/17 0700 In: 1113.3 [P.O.:960; I.V.:103.3; IV Piggyback:50] Out: 1601 [Urine:1675; Chest Tube:80] Intake/Output this shift: No intake/output data recorded.  General appearance: alert, cooperative and no distress Heart: regular rate and rhythm, S1, S2 normal, no murmur, click, rub or gallop Lungs: clear to auscultation bilaterally Abdomen: distended, non tender, ++BS Extremities: upper and lower extremity edema Wound: clean and dry  Lab Results: Recent Labs    03/26/17 0333 03/27/17 0411  WBC 11.9* 12.6*  HGB 11.1* 11.0*  HCT 34.5* 34.3*  PLT 133* 144*   BMET:  Recent Labs    03/26/17 0333 03/27/17 0411  NA 135 136  K 4.2 3.9  CL 104 101  CO2 26 27  GLUCOSE 126* 104*  BUN 15 12  CREATININE 1.06 1.00  CALCIUM 7.9* 8.1*    PT/INR:  Recent Labs    03/27/17 0411  LABPROT 14.8  INR 1.17   ABG    Component Value Date/Time   PHART 7.369 03/24/2017 2027   HCO3 22.0 03/24/2017 2027   TCO2 21 (L) 03/25/2017 1736   ACIDBASEDEF 3.0 (H) 03/24/2017 2027   O2SAT 98.0  03/24/2017 2027   CBG (last 3)  Recent Labs    03/26/17 1632 03/26/17 2133 03/27/17 0604  GLUCAP 125* 124* 100*    Assessment/Plan: S/P Procedure(s) (LRB): MITRAL VALVE REPAIR (MVR) with Sorin Carbomedics Annuloflex size 28 (N/A) CORONARY ARTERY BYPASS GRAFTING (CABG)x1 using left internal mammary artery, LIMA-LAD (N/A) TRANSESOPHAGEAL ECHOCARDIOGRAM (TEE) (N/A)  1. CV-hemodynamically stable, BP slowly climbing. HR in the 70s NSR.  2. Pulm-tolerating room air. Chest tube removed. Continue incentive spirometer use.  3. Renal-creatinine 1.00, electrolytes stable. Making good urine. Weight remains 10 lbs over baseline. Lasix 40mg  PO BID today 4. H and H stable, minor acute blood loss anemia 5. Endo-CBGs well controlled 6. Low-dose coumadin 2.5mg  with INR 1.17 7. GI-has been tolerating clear liquids, belching but no flatus. ++BS, will order protein shakes.  8. ID-leukocytosis, likly reactive but will monitor. Use IS hourly.  Plan: start low-dose BB this afternoon. Continue to work on caloric intake. Ambulate around the unit today. Continue to use incentive spirometer. Continue diuretic regimen for fluid overload.    LOS: 3 days    James [name] 03/27/2017   patient examined and medical record reviewed,agree with above note. James [name] 03/27/2017

## 2017-03-28 LAB — GLUCOSE, CAPILLARY
Glucose-Capillary: 109 mg/dL — ABNORMAL HIGH (ref 65–99)
Glucose-Capillary: 89 mg/dL (ref 65–99)
Glucose-Capillary: 112 mg/dL — ABNORMAL HIGH (ref 65–99)
Glucose-Capillary: 131 mg/dL — ABNORMAL HIGH (ref 65–99)
Glucose-Capillary: 103 mg/dL — ABNORMAL HIGH (ref 65–99)

## 2017-03-28 LAB — PROTIME-INR
INR: 1.15
Prothrombin Time: 14.6 seconds (ref 11.4–15.2)

## 2017-03-28 MED ORDER — POLYETHYLENE GLYCOL 3350 17 G PO PACK
17.0000 g | PACK | Freq: Every day | ORAL | Status: DC
  Administered 2017-03-28: 17 g via ORAL
  Filled 2017-03-28: qty 1

## 2017-03-28 NOTE — Progress Notes (Signed)
RN already placed pt on CPAP. Pt tolerating well and RT to cont to monitor.

## 2017-03-28 NOTE — Progress Notes (Addendum)
      [address]       [place] [address]             [phone]      4 Days Post-Op Procedure(s) (LRB): MITRAL VALVE REPAIR (MVR) with Sorin Carbomedics Annuloflex size 28 (N/A) CORONARY ARTERY BYPASS GRAFTING (CABG)x1 using left internal mammary artery, LIMA-LAD (N/A) TRANSESOPHAGEAL ECHOCARDIOGRAM (TEE) (N/A) Subjective: Feels better this morning. His appetite is better.   Objective: Vital signs in last 24 hours: Temp:  [98.1 F (36.7 C)-98.5 F (36.9 C)] 98.5 F (36.9 C) (11/18 0716) Pulse Rate:  [62-79] 66 (11/18 0716) Cardiac Rhythm: Normal sinus rhythm (11/18 0730) Resp:  [14-27] 19 (11/18 0716) BP: (111-128)/(56-74) 123/73 (11/18 0716) SpO2:  [96 %-100 %] 96 % (11/18 0716) Weight:  [230 lb 11.2 oz (104.6 kg)] 230 lb 11.2 oz (104.6 kg) (11/18 0551)  Hemodynamic parameters for last 24 hours:    Intake/Output from previous day: 11/17 0701 - 11/18 0700 In: 1206 [P.O.:1200; I.V.:6] Out: 1975 [[id number]] Intake/Output this shift: No intake/output data recorded.  General appearance: alert, cooperative and no distress Heart: regular rate and rhythm, S1, S2 normal, no murmur, click, rub or gallop Lungs: clear to auscultation bilaterally Abdomen: distended, +BS Extremities: 1+ non pitting pedal edema. upper extremity edema Wound: clean and dry  Lab Results: Recent Labs    03/26/17 0333 03/27/17 0411  WBC 11.9* 12.6*  HGB 11.1* 11.0*  HCT 34.5* 34.3*  PLT 133* 144*   BMET:  Recent Labs    03/26/17 0333 03/27/17 0411  NA 135 136  K 4.2 3.9  CL 104 101  CO2 26 27  GLUCOSE 126* 104*  BUN 15 12  CREATININE 1.06 1.00  CALCIUM 7.9* 8.1*    PT/INR:  Recent Labs    03/28/17 0210  LABPROT 14.6  INR 1.15   ABG    Component Value Date/Time   PHART 7.369 03/24/2017 2027   HCO3 22.0 03/24/2017 2027   TCO2 21 (L) 03/25/2017 1736   ACIDBASEDEF 3.0 (H) 03/24/2017 2027   O2SAT 98.0 03/24/2017 2027   CBG (last 3)  Recent Labs   03/27/17 1556 03/27/17 2159 03/28/17 0608  GLUCAP 109* 102* 89    Assessment/Plan: S/P Procedure(s) (LRB): MITRAL VALVE REPAIR (MVR) with Sorin Carbomedics Annuloflex size 28 (N/A) CORONARY ARTERY BYPASS GRAFTING (CABG)x1 using left internal mammary artery, LIMA-LAD (N/A) TRANSESOPHAGEAL ECHOCARDIOGRAM (TEE) (N/A)  1. CV-hemodynamically stable, BP well controlled. HR in the 70s NSR. Tolerating BB. Discontinue EPW today 2. Pulm-tolerating room air. Continue incentive spirometer use.  3. Renal-creatinine 1.00, electrolytes stable. Making good urine. Weight remains 5 lbs over baseline. Lasix 40mg  PO BID today 4. H and H stable, minor acute blood loss anemia 5. Endo-CBGs well controlled 6. Low-dose coumadin 2.5mg  with INR 1.15 7. GI-tolerating breakfast but nausea in the afternoons. ++BS, will order protein shakes.  8. ID-leukocytosis, likly reactive but will monitor. Use IS hourly.  Plan: No BM, will order miralax as well as stool softeners. EPW out today. Continue to work on ambulation. Home in 1-2 days.    LOS: 4 days    Elgie Collard 03/28/2017   Cont with lasix, coumadin Patient ambulating prob home tues am patient examined and medical record reviewed,agree with above note. Tharon Aquas Trigt III 03/28/2017

## 2017-03-28 NOTE — Progress Notes (Signed)
Patient education completed for removal of pacer wires, he verbalized understanding, 4 pacing wires removed as ordered, patient was on bed rest for 1 hour, vital signs remain stable, will continue to monitor.

## 2017-03-28 NOTE — Progress Notes (Signed)
Patient stated that he ambulated in the hall with his wife, he reported tolerating it well, will continue to monitor.

## 2017-03-29 LAB — BASIC METABOLIC PANEL
Anion gap: 5 (ref 5–15)
BUN: 13 mg/dL (ref 6–20)
CO2: 30 mmol/L (ref 22–32)
Calcium: 8 mg/dL — ABNORMAL LOW (ref 8.9–10.3)
Chloride: 102 mmol/L (ref 101–111)
Creatinine, Ser: 1.01 mg/dL (ref 0.61–1.24)
GFR calc Af Amer: >60 mL/min (ref >60)
GFR calc non Af Amer: >60 mL/min (ref >60)
Glucose, Bld: 107 mg/dL — ABNORMAL HIGH (ref 65–99)
Potassium: 4.3 mmol/L (ref 3.5–5.1)
Sodium: 137 mmol/L (ref 135–145)

## 2017-03-29 LAB — GLUCOSE, CAPILLARY
Glucose-Capillary: 117 mg/dL — ABNORMAL HIGH (ref 65–99)
Glucose-Capillary: 89 mg/dL (ref 65–99)

## 2017-03-29 LAB — CBC
HCT: 31.1 % — ABNORMAL LOW (ref 39.0–52.0)
Hemoglobin: 10.1 g/dL — ABNORMAL LOW (ref 13.0–17.0)
MCH: 29.5 pg (ref 26.0–34.0)
MCHC: 32.5 g/dL (ref 30.0–36.0)
MCV: 90.9 fL (ref 78.0–100.0)
Platelets: 179 10*3/uL (ref 150–400)
RBC: 3.42 MIL/uL — ABNORMAL LOW (ref 4.22–5.81)
RDW: 14.4 % (ref 11.5–15.5)
WBC: 7.1 10*3/uL (ref 4.0–10.5)

## 2017-03-29 LAB — PROTIME-INR
INR: 1.05
Prothrombin Time: 13.6 seconds (ref 11.4–15.2)

## 2017-03-29 MED ORDER — LACTULOSE 10 GM/15ML PO SOLN
20.0000 g | Freq: Once | ORAL | Status: DC
  Filled 2017-03-29 (×2): qty 30

## 2017-03-29 MED ORDER — WARFARIN SODIUM 5 MG PO TABS
5.0000 mg | ORAL_TABLET | Freq: Every day | ORAL | Status: DC
  Administered 2017-03-29: 5 mg via ORAL
  Filled 2017-03-29: qty 1

## 2017-03-29 MED ORDER — WARFARIN SODIUM 4 MG PO TABS: 4.0000 mg | ORAL_TABLET | Freq: Every day | ORAL | Status: DC

## 2017-03-29 NOTE — Progress Notes (Signed)
CARDIAC REHAB PHASE I   PRE:  Rate/Rhythm: 69 SR  BP:  Supine:   Sitting: 123/71  Standing:    SaO2: 95%RA  MODE:  Ambulation: 790 ft   POST:  Rate/Rhythm: 91 SR  BP:  Supine:   Sitting: 136/73  Standing:    SaO2: 97%RA 0825-0847 Pt walked 790 ft independently with rolling walker. Tolerated well on RA. Briefly reviewed carb counting.   Graylon Good, RN BSN  03/29/2017 8:44 AM

## 2017-03-29 NOTE — Care Management Important Message (Signed)
Important Message  Patient Details  Name: James [name] MRN: [id number] Date of Birth: Oct 20, 1950   Medicare Important Message Given:  Yes    Teren Zurcher Abena 03/29/2017, 10:06 AM

## 2017-03-29 NOTE — Progress Notes (Addendum)
      [address]       [place] [address]             [phone]        5 Days Post-Op Procedure(s) (LRB): MITRAL VALVE REPAIR (MVR) with Sorin Carbomedics Annuloflex size 28 (N/A) CORONARY ARTERY BYPASS GRAFTING (CABG)x1 using left internal mammary artery, LIMA-LAD (N/A) TRANSESOPHAGEAL ECHOCARDIOGRAM (TEE) (N/A)  Subjective: Patient passing flatus but no bowel movement yet.  Objective: Vital signs in last 24 hours: Temp:  [97.6 F (36.4 C)-99.5 F (37.5 C)] 97.7 F (36.5 C) (11/19 0407) Pulse Rate:  [63-98] 63 (11/19 0407) Cardiac Rhythm: Normal sinus rhythm (11/18 2055) Resp:  [17-29] 20 (11/19 0407) BP: (109-145)/(50-85) 116/67 (11/19 0407) SpO2:  [95 %-100 %] 99 % (11/19 0407) Weight:  [229 lb 12.8 oz (104.2 kg)] 229 lb 12.8 oz (104.2 kg) (11/19 0234)  Pre op weight 102.1 kg Current Weight  03/29/17 229 lb 12.8 oz (104.2 kg)      Intake/Output from previous day: 11/18 0701 - 11/19 0700 In: 960 [P.O.:960] Out: 1150 [Urine:1150]   Physical Exam:  Cardiovascular: RRR, no murmur Pulmonary: Slightly diminished at bases Abdomen: Soft, non tender, bowel sounds present. Extremities: Pre tibial pitting bilateral lower extremity edema. Wounds: Clean and dry.  No erythema or signs of infection.  Lab Results: CBC: Recent Labs    03/27/17 0411 03/29/17 0218  WBC 12.6* 7.1  HGB 11.0* 10.1*  HCT 34.3* 31.1*  PLT 144* 179   BMET:  Recent Labs    03/27/17 0411 03/29/17 0218  NA 136 137  K 3.9 4.3  CL 101 102  CO2 27 30  GLUCOSE 104* 107*  BUN 12 13  CREATININE 1.00 1.01  CALCIUM 8.1* 8.0*    PT/INR:  Lab Results  Component Value Date   INR 1.05 03/29/2017   INR 1.15 03/28/2017   INR 1.17 03/27/2017   ABG:  INR: Will add last result for INR, ABG once components are confirmed Will add last 4 CBG results once components are confirmed  Assessment/Plan:  1. CV - SR in the 60's. On Lopressor 12.5 mg bid and Coumadin. He has  been on Coumadin 2.5 mg. INR decreased from 1.15 to 1.05 this am. Will increase Coumadin to 4 mg. 2.  Pulmonary - On room air. Encourage incentive spirometer. 3. Volume Overload - On Lasix 40 mg bid 4.  Acute blood loss anemia - H and H 10.1 and 31.1 5. Newly diagnosed DM-CBGs 131/103/89 . On Insulin. Pre op HGA1C 6.5.  I spoke with patient. I will stop scheduled accu checks and Insulin, talked about changes in diet (and will include in his discharge instructions), follow up with medical doctor for further surveillance and he/she to determine if he needs to start oral medication. 6. Lactulose for constipation  Donielle M ZimmermanPA-C 03/29/2017,7:28 AM  I have seen and examined the patient and agree with the assessment and plan as outlined.  Looks good.  D/C insulin.  Mobilize.  Increase Coumadin.  Continue low dose beta blocker.  Continue to hold preop ARB but restart if BP increases.  Possible d/c home 1-2 days  Rexene Alberts, MD 03/29/2017 8:42 AM

## 2017-03-30 ENCOUNTER — Telehealth: Payer: Self-pay | Admitting: *Deleted

## 2017-03-30 ENCOUNTER — Other Ambulatory Visit: Payer: Self-pay | Admitting: *Deleted

## 2017-03-30 DIAGNOSIS — R600 Localized edema: Secondary | ICD-10-CM

## 2017-03-30 LAB — PROTIME-INR
INR: 1.03
Prothrombin Time: 13.4 seconds (ref 11.4–15.2)

## 2017-03-30 MED ORDER — WARFARIN SODIUM 5 MG PO TABS: 5.0000 mg | ORAL_TABLET | Freq: Every day | ORAL | 3 refills | Status: DC

## 2017-03-30 MED ORDER — METOPROLOL TARTRATE 25 MG PO TABS: 12.5000 mg | ORAL_TABLET | Freq: Two times a day (BID) | ORAL | 3 refills | Status: DC

## 2017-03-30 MED ORDER — TRAMADOL HCL 50 MG PO TABS: 50.0000 mg | ORAL_TABLET | ORAL | 0 refills | Status: DC | PRN

## 2017-03-30 MED ORDER — POTASSIUM CHLORIDE CRYS ER 20 MEQ PO TBCR: 20.0000 meq | EXTENDED_RELEASE_TABLET | Freq: Every day | ORAL | 1 refills | Status: DC

## 2017-03-30 NOTE — Care Management Note (Signed)
Case Management Note Original Note Created Zenon Mayo, RN 03/26/2017, 10:31 AM  Patient Details  Name: James [name] MRN: [id number] Date of Birth: 1950/08/25  Subjective/Objective:  From home with wife, who is able to assist him 24/7, he has PCP and medication ast, he is POD 1 CABG, MVR.  He uses a cane at home , but not all the time.                     Action/Plan: NCM will follow for dc needs.  Expected Discharge Date:  03/30/17               Expected Discharge Plan:  Home/Self Care  In-House Referral:  NA  Discharge planning Services  CM Consult  Post Acute Care Choice:  NA Choice offered to:  NA  DME Arranged:    DME Agency:     HH Arranged:    HH Agency:     Status of Service:  Completed, signed off  If discussed at [employer] of Stay Meetings, dates discussed:    Discharge Disposition: home/self care   Additional Comments:  03/30/17- 1000- Jailan Trimm RN, CM- pt for d/c home today- per pt he has needed DME at home that includes walker and cane. No other CM needs noted for discharge.   Dahlia Client [place], RN 03/30/2017, 10:49 AM [phone]

## 2017-03-30 NOTE — Telephone Encounter (Signed)
Pt is on TCM list was admitted 03/24/17 for S/P mitral valve repair + CABG x1. Pt was d/c on 03/30/17 and will f/u w/cardiology  Almyra Deforest, PA Follow up.   Specialties:  Cardiology, Radiology Why:  Almyra Deforest, PA-C 12/5 @8am  ([facility])

## 2017-03-30 NOTE — Progress Notes (Signed)
CT sutures removed per order and per protocol. Sites painted with tincture, steri-strips applied. Pt educated not to scrub to remove strips.   D/c education done with pt and pts wife. Paper scripts given to pt. No more questions.  IV and tele removed.

## 2017-03-30 NOTE — Progress Notes (Signed)
      [address]       [place] [address]             [phone]      6 Days Post-Op Procedure(s) (LRB): MITRAL VALVE REPAIR (MVR) with Sorin Carbomedics Annuloflex size 28 (N/A) CORONARY ARTERY BYPASS GRAFTING (CABG)x1 using left internal mammary artery, LIMA-LAD (N/A) TRANSESOPHAGEAL ECHOCARDIOGRAM (TEE) (N/A)   Subjective:  Mr. Primmer has no new complaints.  He wants to go home.  Objective: Vital signs in last 24 hours: Temp:  [97.7 F (36.5 C)-98.6 F (37 C)] 97.9 F (36.6 C) (11/20 0441) Pulse Rate:  [63-81] 63 (11/20 0008) Cardiac Rhythm: Normal sinus rhythm (11/20 0700) BP: (109-126)/(68-70) 118/70 (11/20 0441) SpO2:  [96 %-99 %] 99 % (11/20 0008) Weight:  [226 lb 3.1 oz (102.6 kg)] 226 lb 3.1 oz (102.6 kg) (11/20 0449)  General appearance: alert, cooperative and no distress Heart: regular rate and rhythm Lungs: clear to auscultation bilaterally Abdomen: soft, non-tender; bowel sounds normal; no masses,  no organomegaly Extremities: edema trace Wound: clean and dry  Lab Results: Recent Labs    03/29/17 0218  WBC 7.1  HGB 10.1*  HCT 31.1*  PLT 179   BMET:  Recent Labs    03/29/17 0218  NA 137  K 4.3  CL 102  CO2 30  GLUCOSE 107*  BUN 13  CREATININE 1.01  CALCIUM 8.0*    PT/INR:  Recent Labs    03/30/17 0244  LABPROT 13.4  INR 1.03   ABG    Component Value Date/Time   PHART 7.369 03/24/2017 2027   HCO3 22.0 03/24/2017 2027   TCO2 21 (L) 03/25/2017 1736   ACIDBASEDEF 3.0 (H) 03/24/2017 2027   O2SAT 98.0 03/24/2017 2027   CBG (last 3)  Recent Labs    03/28/17 1619 03/28/17 2115 03/29/17 0612  GLUCAP 131* 103* 89    Assessment/Plan: S/P Procedure(s) (LRB): MITRAL VALVE REPAIR (MVR) with Sorin Carbomedics Annuloflex size 28 (N/A) CORONARY ARTERY BYPASS GRAFTING (CABG)x1 using left internal mammary artery, LIMA-LAD (N/A) TRANSESOPHAGEAL ECHOCARDIOGRAM (TEE) (N/A)  1. CV- NSR, BP remains well controlled-  continue Lopressor, will not start home ARB at this time 2. INR 1.03, continue coumadin at current regimen 3. Pulm- no acute issues, continue IS 4. Renal- creatinine stable, + LE edema continue lasix, will resume home regimen 5. DM- new diagnosis, dietary modification for now, patient will follow up with PCP 6. Dispo- patient stable, will d/c home today   LOS: 6 days    Ellwood Handler 03/30/2017

## 2017-03-31 ENCOUNTER — Telehealth (HOSPITAL_COMMUNITY): Payer: Self-pay

## 2017-03-31 ENCOUNTER — Ambulatory Visit (INDEPENDENT_AMBULATORY_CARE_PROVIDER_SITE_OTHER): Payer: Medicare Other | Admitting: Pharmacist Clinician (PhC)/ Clinical Pharmacy Specialist

## 2017-03-31 ENCOUNTER — Other Ambulatory Visit: Payer: Medicare Other

## 2017-03-31 ENCOUNTER — Other Ambulatory Visit: Payer: Self-pay | Admitting: Medical Oncology

## 2017-03-31 DIAGNOSIS — I2699 Other pulmonary embolism without acute cor pulmonale: Secondary | ICD-10-CM

## 2017-03-31 DIAGNOSIS — C3491 Malignant neoplasm of unspecified part of right bronchus or lung: Secondary | ICD-10-CM

## 2017-03-31 DIAGNOSIS — Z7901 Long term (current) use of anticoagulants: Secondary | ICD-10-CM | POA: Insufficient documentation

## 2017-03-31 DIAGNOSIS — Z9889 Other specified postprocedural states: Secondary | ICD-10-CM

## 2017-03-31 LAB — POCT INR: INR: 1.1

## 2017-03-31 NOTE — Telephone Encounter (Signed)
Patients insurance is active and benefits verified through [facility] - $20.00 co-pay, no deductible, out of pocket amount of $3,300/$2,683.97 has been met, no co-insurance, and no pre-authorization is required. Passport/reference [id number]  Patient will be contacted and scheduled after their follow up appt with the Cardiologist office upon review by the RN Navigator.

## 2017-04-05 ENCOUNTER — Ambulatory Visit (INDEPENDENT_AMBULATORY_CARE_PROVIDER_SITE_OTHER): Payer: Medicare Other | Admitting: Pharmacist

## 2017-04-05 DIAGNOSIS — Z9889 Other specified postprocedural states: Secondary | ICD-10-CM | POA: Diagnosis not present

## 2017-04-05 DIAGNOSIS — Z7901 Long term (current) use of anticoagulants: Secondary | ICD-10-CM

## 2017-04-05 LAB — POCT INR: INR: 2

## 2017-04-06 ENCOUNTER — Other Ambulatory Visit: Payer: Self-pay | Admitting: *Deleted

## 2017-04-06 DIAGNOSIS — G8918 Other acute postprocedural pain: Secondary | ICD-10-CM

## 2017-04-06 MED ORDER — TRAMADOL HCL 50 MG PO TABS: 50.0000 mg | ORAL_TABLET | Freq: Four times a day (QID) | ORAL | 0 refills | Status: DC | PRN

## 2017-04-06 NOTE — Telephone Encounter (Signed)
Patient returning call and would like to speak with Caryl Pina - he can be reached at [phone]

## 2017-04-06 NOTE — Telephone Encounter (Signed)
OK with me to write a script for a replacement CPAP device.

## 2017-04-07 ENCOUNTER — Encounter: Payer: Self-pay | Admitting: Internal Medicine

## 2017-04-07 ENCOUNTER — Telehealth: Payer: Self-pay

## 2017-04-07 ENCOUNTER — Ambulatory Visit: Payer: Medicare Other | Admitting: Internal Medicine

## 2017-04-07 DIAGNOSIS — Z85118 Personal history of other malignant neoplasm of bronchus and lung: Secondary | ICD-10-CM

## 2017-04-07 DIAGNOSIS — R5383 Other fatigue: Secondary | ICD-10-CM | POA: Diagnosis not present

## 2017-04-07 DIAGNOSIS — C3490 Malignant neoplasm of unspecified part of unspecified bronchus or lung: Secondary | ICD-10-CM

## 2017-04-07 NOTE — Progress Notes (Signed)
[place] [phone]   [phone]  OFFICE PROGRESS NOTE  [name], MD [address] [facility] [address] [place] [address]  DIAGNOSIS: Stage IA (T1c, N0, M0) non-small cell lung cancer, adenocarcinoma presented with right upper lobe lung nodule   PRIOR THERAPY: status post right upper lobectomy with lymph node dissection under the care of Dr. [name] on 09/02/2016.  CURRENT THERAPY: Observation.  INTERVAL HISTORY: James [name] 66 y.o. male returns to the clinic today for follow-up visit accompanied by his wife.  The patient is feeling fine today except for fatigue.  He is recovering from his recent surgery for mitral valve repair under the care of Dr. Roxy Manns.  He continues to have mild sternal chest pain and shortness of breath with exertion but no cough or hemoptysis.  He denied having any recent weight loss or night sweats.  He has no nausea, vomiting, diarrhea or constipation.  He had repeat CT angiogram of the chest performed on March 22, 2017 and the patient is here today for evaluation and discussion of his discuss results.  MEDICAL HISTORY: Past Medical History:  Diagnosis Date  . Adenocarcinoma of right lung, stage 1 ([place]) 09/06/2016  . Anxiety   . Arthritis    "knees, hips, back" (10/19/2012)  . Chronic diastolic congestive heart failure ([place])   . Chronic lower back pain   . Colon polyps    10/27/2002, repeat letter 09/17/2007  . Coronary artery disease   . Coronary artery disease involving native coronary artery of native heart without angina pectoris   . Depressive disorder, not elsewhere classified    no meds  . Diabetes mellitus without complication ([place])    diet controlled- no med  (while in hosp 4/18 -elevated cbg  . Dyspnea   . Fasting hyperglycemia   . GERD (gastroesophageal reflux disease)   . Heart murmur   . Hemoptysis    abnormal CT Chest 01/29/10 - ? new GG changes RUL > not viz on plain cxr 02/26/2010  .  Hypertension   . Mitral regurgitation    severe MR 08/2016  . MPN (myeloproliferative neoplasm) ([place])    1st detected 06/04/1998  . Obesity   . OSA on CPAP    last sleep study 10 years ago  . Other and unspecified hyperlipidemia   . Peripheral vascular disease ([place]) 08/2016   after lung surgey small clots in lungs,after hip dvt-5/16  . Pneumonia    4/18  . Positive PPD 1965   "non reactive in 2012" (10/19/2012)  . Routine general medical examination at a health care facility   . S/P CABG x 1 03/24/2017   LIMA to LAD  . S/P mitral valve repair 03/24/2017   Complex valvuloplasty including artificial Gore-tex neochord placement x6 and 28 mm Sorin Annuloflex posterior annuloplasty band  . Special screening for malignant neoplasm of prostate   . Spinal stenosis, unspecified region other than cervical   . Wrist pain, left     ALLERGIES:  is allergic to symbicort [budesonide-formoterol fumarate] and amoxicillin.  MEDICATIONS:  Current Outpatient Medications  Medication Sig Dispense Refill  . acetaminophen (TYLENOL) 325 MG tablet Take 650 mg by mouth every 6 (six) hours as needed for moderate pain or headache.    Marland Kitchen aspirin EC 81 MG tablet Take 1 tablet (81 mg total) by mouth daily. 90 tablet 3  . atorvastatin (LIPITOR) 20 MG tablet Take 1 tablet (20 mg total) by mouth daily.  90 tablet 3  . furosemide (LASIX) 40 MG tablet TAKE 1 TABLET BY MOUTH EVERY DAY (Patient taking differently: TAKE 40 MG BY MOUTH EVERY DAY) 90 tablet 3  . metoprolol tartrate (LOPRESSOR) 25 MG tablet Take 0.5 tablets (12.5 mg total) 2 (two) times daily by mouth. 30 tablet 3  . Omega-3 Fatty Acids (FISH OIL) 500 MG CAPS Take 500 mg by mouth daily.     . potassium chloride SA (K-DUR,KLOR-CON) 20 MEQ tablet Take 1 tablet (20 mEq total) by mouth daily. 30 tablet 1  . psyllium (METAMUCIL) 58.6 % packet Take 1 packet by mouth daily.    . sodium chloride (OCEAN) 0.65 % SOLN nasal spray Place 2 sprays into both nostrils at  bedtime.    Bethann Humble Sulfate (ALLERGY RELIEF EYE DROPS OP) Apply 2 drops to eye as needed (for allergies).     . traMADol (ULTRAM) 50 MG tablet Take 1 tablet (50 mg total) by mouth every 6 (six) hours as needed (may take one or two tablets every six hrs prn). 30 tablet 0  . Undecylenic Ac-Zn Undecylenate (FUNGI-NAIL TOE & FOOT EX) Apply 1 application topically daily as needed (for nail).     . warfarin (COUMADIN) 5 MG tablet Take 1 tablet (5 mg total) daily at 6 PM by mouth. 30 tablet 3   No current facility-administered medications for this visit.     SURGICAL HISTORY:  Past Surgical History:  Procedure Laterality Date  . ANTERIOR CRUCIATE LIGAMENT REPAIR Left 1967  . CARDIAC CATHETERIZATION  2000  . CHEST TUBE INSERTION Right 10/19/2012   post bronch  . COLONOSCOPY W/ POLYPECTOMY    . CORONARY ARTERY BYPASS GRAFT N/A 03/24/2017   Procedure: CORONARY ARTERY BYPASS GRAFTING (CABG)x1 using left internal mammary artery, LIMA-LAD;  Surgeon: Rexene Alberts, MD;  Location: [facility];  Service: Open Heart Surgery;  Laterality: N/A;  . FLEXIBLE BRONCHOSCOPY  10/19/2012   Flexible video fiberoptic bronchoscopy with electromagnetic navigation and biopsies.  . INTRAVASCULAR PRESSURE WIRE/FFR STUDY N/A 08/11/2016   Procedure: Intravascular Pressure Wire/FFR Study;  Surgeon: [name], MD;  Location: [facility];  Service: Cardiovascular;  Laterality: N/A;  . LOBECTOMY Right 09/02/2016   Procedure: RIGHT UPPER LOBECTOMY;  Surgeon: Melrose Nakayama, MD;  Location: [facility];  Service: Thoracic;  Laterality: Right;  . LYMPH NODE DISSECTION Right 09/02/2016   Procedure: LYMPH NODE DISSECTION, RIGHT LUNG;  Surgeon: Melrose Nakayama, MD;  Location: [facility];  Service: Thoracic;  Laterality: Right;  . MITRAL VALVE REPAIR N/A 03/24/2017   Procedure: MITRAL VALVE REPAIR (MVR) with Sorin Carbomedics Annuloflex size 28;  Surgeon: Rexene Alberts, MD;  Location: [facility];  Service: Open Heart  Surgery;  Laterality: N/A;  . RIGHT/LEFT HEART CATH AND CORONARY ANGIOGRAPHY N/A 08/11/2016   Procedure: Right/Left Heart Cath and Coronary Angiography;  Surgeon: [name], MD;  Location: [facility];  Service: Cardiovascular;  Laterality: N/A;  . TEE WITHOUT CARDIOVERSION N/A 07/17/2016   Procedure: TRANSESOPHAGEAL ECHOCARDIOGRAM (TEE);  Surgeon: [name], MD;  Location: [facility];  Service: Cardiovascular;  Laterality: N/A;  . TEE WITHOUT CARDIOVERSION N/A 03/24/2017   Procedure: TRANSESOPHAGEAL ECHOCARDIOGRAM (TEE);  Surgeon: Rexene Alberts, MD;  Location: [facility];  Service: Open Heart Surgery;  Laterality: N/A;  . TONSILLECTOMY  1950's  . TOTAL HIP ARTHROPLASTY Left 10/05/2014   dr [name]  . TOTAL HIP ARTHROPLASTY Left 10/05/2014   Procedure: LEFT TOTAL HIP ARTHROPLASTY ANTERIOR APPROACH;  Surgeon: [name], MD;  Location: [facility];  Service: Orthopedics;  Laterality: Left;  Marland Kitchen VIDEO ASSISTED THORACOSCOPY (VATS)/WEDGE RESECTION Right 09/02/2016   Procedure: VIDEO ASSISTED THORACOSCOPY (VATS)/RIGHT UPPER LOBE WEDGE RESECTION;  Surgeon: Melrose Nakayama, MD;  Location: [facility];  Service: Thoracic;  Laterality: Right;  Marland Kitchen VIDEO BRONCHOSCOPY WITH ENDOBRONCHIAL NAVIGATION N/A 10/19/2012   Procedure: VIDEO BRONCHOSCOPY WITH ENDOBRONCHIAL NAVIGATION;  Surgeon: [name], MD;  Location: [facility];  Service: Thoracic;  Laterality: N/A;  . WRIST RECONSTRUCTION Left 12/2009   'proximal row carpectomy" [name]    REVIEW OF SYSTEMS:  A comprehensive review of systems was negative except for: Constitutional: positive for fatigue Respiratory: positive for dyspnea on exertion and pleurisy/chest pain   PHYSICAL EXAMINATION: General appearance: alert, cooperative, fatigued and no distress Head: Normocephalic, without obvious abnormality, atraumatic Neck: no adenopathy, no JVD, supple, symmetrical, trachea midline and thyroid not enlarged, symmetric, no tenderness/mass/nodules Lymph  nodes: Cervical, supraclavicular, and axillary nodes normal. Resp: clear to auscultation bilaterally Back: symmetric, no curvature. ROM normal. No CVA tenderness. Cardio: regular rate and rhythm, S1, S2 normal, no murmur, click, rub or gallop GI: soft, non-tender; bowel sounds normal; no masses,  no organomegaly Extremities: extremities normal, atraumatic, no cyanosis or edema  ECOG PERFORMANCE STATUS: 1 - Symptomatic but completely ambulatory  Blood pressure 117/65, pulse 77, temperature 98.3 F (36.8 C), temperature source Oral, resp. rate 20, height 5\' 9"  (1.753 m), weight 221 lb 4.8 oz (100.4 kg), SpO2 99 %.  LABORATORY DATA: Lab Results  Component Value Date   WBC 7.1 03/29/2017   HGB 10.1 (L) 03/29/2017   HCT 31.1 (L) 03/29/2017   MCV 90.9 03/29/2017   PLT 179 03/29/2017      Chemistry      Component Value Date/Time   NA 137 03/29/2017 0218   NA 142 10/01/2016 1534   K 4.3 03/29/2017 0218   K 4.2 10/01/2016 1534   CL 102 03/29/2017 0218   CO2 30 03/29/2017 0218   CO2 28 10/01/2016 1534   BUN 13 03/29/2017 0218   BUN 15.9 10/01/2016 1534   CREATININE 1.01 03/29/2017 0218   CREATININE 1.10 02/08/2017 1101   CREATININE 1.5 (H) 10/01/2016 1534      Component Value Date/Time   CALCIUM 8.0 (L) 03/29/2017 0218   CALCIUM 9.6 10/01/2016 1534   ALKPHOS 59 03/22/2017 1100   ALKPHOS 65 10/01/2016 1534   AST 31 03/22/2017 1100   AST 20 10/01/2016 1534   ALT 36 03/22/2017 1100   ALT 24 10/01/2016 1534   BILITOT 0.7 03/22/2017 1100   BILITOT 0.46 10/01/2016 1534       RADIOGRAPHIC STUDIES: Dg Chest 2 View  Result Date: 03/27/2017 CLINICAL DATA:  Atelectasis. Status post mitral valve repair and CABG on 03/24/2017. History of right lobectomy in April of 2018. Additional history of coronary artery disease, diabetes, hypertension, mitral regurgitation, pneumonia. EXAM: CHEST  2 VIEW COMPARISON:  Chest x-rays dated 03/26/2017, 03/25/2017 and 03/24/2017. FINDINGS: Right IJ  sheath has been removed. Mediastinal drain and left-sided chest tube have also been removed. No pneumothorax seen although characterization is slightly limited by patient motion artifact. Lungs appear clear. Heart size and mediastinal contours are stable. IMPRESSION: 1. Right IJ sheath, mediastinal drain and left-sided chest tube have been removed. No evidence of procedural complicating feature. No pneumothorax seen, although characterization is slightly limited by patient motion artifact. 2. Lungs are clear. No evidence of pneumonia or significant atelectasis. Electronically Signed   By: Cherlynn Kaiser  Enriqueta Shutter M.D.   On: 03/27/2017 11:34   Dg Chest 2 View  Result Date: 03/22/2017 CLINICAL DATA:  Preoperative examination. History of wedge resection on the right. History of stage I adenocarcinoma of the right lung. EXAM: CHEST  2 VIEW COMPARISON:  Chest x-ray of November 17, 2016 FINDINGS: There is chronic volume loss on the right with elevation of the hemidiaphragm. Both lungs appear clear. The heart and pulmonary vascularity are normal. The mediastinum is normal in width. There numerous surgical clips in the right hilar region. IMPRESSION: No acute cardiopulmonary abnormality. Postsurgical changes on the right. Electronically Signed   By: David  Martinique M.D.   On: 03/22/2017 11:33   Dg Chest Port 1 View  Result Date: 03/26/2017 CLINICAL DATA:  66 year old male postoperative day two mitral valve repair and CABG. EXAM: PORTABLE CHEST 1 VIEW COMPARISON:  03/25/2017 and earlier. FINDINGS: Portable AP upright view at 0622 hours. Swan-Ganz catheter has been removed. Right IJ introducer sheath remains. Left chest tube and mediastinal tubes remain. No pneumothorax or pulmonary edema. Continued somewhat low lung volumes. Elevation of the right hemidiaphragm appears chronic. Stable mild basilar streaky opacity. Mediastinal contours remain normal. Stable right axillary or chest wall surgical clips. IMPRESSION: 1. Swan-Ganz  catheter removed.  Otherwise stable lines and tubes. 2. No pneumothorax or pulmonary edema. Stable lung base atelectasis. Electronically Signed   By: Genevie Ann M.D.   On: 03/26/2017 08:30   Dg Chest Port 1 View  Result Date: 03/25/2017 CLINICAL DATA:  Status post CABG three days ago EXAM: PORTABLE CHEST 1 VIEW COMPARISON:  Chest x-ray of March 24, 2017 FINDINGS: There has been interval extubation of the trachea and esophagus. The lung volumes remain low. There is persistent bibasilar density. The cardiac silhouette is enlarged. The pulmonary vascularity is less engorged. There is no pneumothorax. The Swan-Ganz catheter tip projects in the distal aspect of the main right pulmonary artery. The mediastinal drain and 2 left lower chest tubes are in stable position. IMPRESSION: Persistent hypoinflation post extubation. No pneumothorax or significant pleural effusion. Persistent bibasilar atelectasis. No significant pulmonary vascular congestion. The remaining support tubes are in reasonable position. Electronically Signed   By: David  Martinique M.D.   On: 03/25/2017 08:42   Dg Chest Port 1 View  Result Date: 03/24/2017 CLINICAL DATA:  Atelectasis EXAM: PORTABLE CHEST 1 VIEW COMPARISON:  Two days ago FINDINGS: Endotracheal tube tip at the clavicular heads. Swan-Ganz catheter from the right with tip at the right main pulmonary artery. An orogastric tube reaches the stomach at least. Thoracic drains are present. Low volume chest with atelectasis. Normal heart size. Interval mitral valve repair. IMPRESSION: 1. Low volume chest with bilateral atelectasis. 2. Unremarkable positioning of tubes and central line. Electronically Signed   By: Monte Fantasia M.D.   On: 03/24/2017 15:30   Ct Angio Chest Aorta W/cm &/or Wo/cm  Result Date: 03/22/2017 CLINICAL DATA:  Right lung cancer. EXAM: CT ANGIOGRAPHY CHEST WITH CONTRAST TECHNIQUE: Multidetector CT imaging of the chest was performed using the standard protocol during  bolus administration of intravenous contrast. Multiplanar CT image reconstructions and MIPs were obtained to evaluate the vascular anatomy. CONTRAST:  42mL ISOVUE-370 IOPAMIDOL (ISOVUE-370) INJECTION 76% COMPARISON:  10/20/2016. FINDINGS: Cardiovascular: Atherosclerotic calcification of the arterial vasculature, including coronary arteries. Heart size normal. No pericardial effusion. Mediastinum/Nodes: No pathologically enlarged mediastinal, hilar or axillary lymph nodes. Esophagus is grossly unremarkable. Lungs/Pleura: Right upper lobectomy. Right lower lobe nodule measures 6 mm, stable. Lungs are otherwise clear. No pleural  fluid. Airway is otherwise unremarkable. Upper Abdomen: Low-attenuation lesions in the liver measure up to 4.3 cm in the right hepatic lobe, as before. Visualized portions of the gallbladder, adrenal glands and right kidney are unremarkable. Probable left renal sinus cysts. Visualized portions of the spleen, pancreas, stomach and bowel are grossly unremarkable. No upper abdominal adenopathy. Musculoskeletal: Degenerative changes in the spine. Review of the MIP images confirms the above findings. IMPRESSION: 1. Right upper lobectomy. No evidence of recurrent or metastatic disease. 2. Aortic atherosclerosis (ICD10-170.0). Coronary artery calcification. Electronically Signed   By: Lorin Picket M.D.   On: 03/22/2017 16:30    ASSESSMENT AND PLAN: This is a very pleasant 66 years old white male with a stage Ia non-small cell lung cancer status post right upper lobectomy with lymph node dissection on September 02, 2016. The patient is currently on observation and he is feeling fine. Repeat CT scan of the chest performed recently showed no evidence for disease recurrence. I discussed the scan results with the patient and his wife and recommended for him to continue on observation with repeat CT scan of the chest in 6 months. The patient was advised to call immediately if he has any concerning  symptoms in the interval. The patient voices understanding of current disease status and treatment options and is in agreement with the current care plan.  All questions were answered. The patient knows to call the clinic with any problems, questions or concerns. We can certainly see the patient much sooner if necessary.  I spent 10 minutes counseling the patient face to face. The total time spent in the appointment was 15 minutes.  Disclaimer: This note was dictated with voice recognition software. Similar sounding words can inadvertently be transcribed and may not be corrected upon review.

## 2017-04-07 NOTE — Telephone Encounter (Signed)
Printed avs and calender for up coming appointment. Per 11/28 los

## 2017-04-14 ENCOUNTER — Ambulatory Visit: Payer: Medicare Other | Admitting: Physician Assistant

## 2017-04-14 ENCOUNTER — Encounter: Payer: Self-pay | Admitting: Physician Assistant

## 2017-04-14 ENCOUNTER — Ambulatory Visit (INDEPENDENT_AMBULATORY_CARE_PROVIDER_SITE_OTHER): Payer: Medicare Other | Admitting: Pharmacist Clinician (PhC)/ Clinical Pharmacy Specialist

## 2017-04-14 VITALS — BP 106/64 | HR 75 | Ht 69.0 in | Wt 224.4 lb

## 2017-04-14 DIAGNOSIS — I5032 Chronic diastolic (congestive) heart failure: Secondary | ICD-10-CM | POA: Diagnosis not present

## 2017-04-14 DIAGNOSIS — E119 Type 2 diabetes mellitus without complications: Secondary | ICD-10-CM | POA: Diagnosis not present

## 2017-04-14 DIAGNOSIS — Z9889 Other specified postprocedural states: Secondary | ICD-10-CM | POA: Diagnosis not present

## 2017-04-14 DIAGNOSIS — Z951 Presence of aortocoronary bypass graft: Secondary | ICD-10-CM | POA: Diagnosis not present

## 2017-04-14 DIAGNOSIS — E785 Hyperlipidemia, unspecified: Secondary | ICD-10-CM | POA: Diagnosis not present

## 2017-04-14 DIAGNOSIS — J986 Disorders of diaphragm: Secondary | ICD-10-CM | POA: Diagnosis not present

## 2017-04-14 DIAGNOSIS — Z7901 Long term (current) use of anticoagulants: Secondary | ICD-10-CM

## 2017-04-14 DIAGNOSIS — I2699 Other pulmonary embolism without acute cor pulmonale: Secondary | ICD-10-CM

## 2017-04-14 LAB — POCT INR: INR: 2.4

## 2017-04-14 MED ORDER — POTASSIUM CHLORIDE CRYS ER 20 MEQ PO TBCR: 20.0000 meq | EXTENDED_RELEASE_TABLET | Freq: Every day | ORAL | 0 refills | Status: DC | PRN

## 2017-04-14 MED ORDER — FUROSEMIDE 40 MG PO TABS: 40.0000 mg | ORAL_TABLET | Freq: Every day | ORAL | 0 refills | Status: DC | PRN

## 2017-04-14 NOTE — Progress Notes (Signed)
Cardiology Office Note    Date:  04/15/2017   ID:  James [name], DOB 1950-11-19, MRN [id number]  PCP:  [name], MD  Cardiologist:  [name]   Chief Complaint  Patient presents with  . Follow-up    seen for [name]. s/p mitral annuloplasty    History of Present Illness:  James [name] is a 66 y.o. male with PMH of DM II, HLD, and severe MR. he was originally noted to have a loud murmur by his primary care physician.  Initial echocardiogram suggested mild MR, however it also suggested partially flail leaflet.  MR was thought to be underestimated.  TEE showed a flail P3 segment with ruptured cord and a severe MR.  Cardiac catheterization performed on 08/11/2016 showed 65% mid to distal LAD lesion, FFR borderline at 0.81, EF 65%.  Normal cardiac output and RV/LV filling pressure.  He had a PET scan on 08/17/2016 that showed a hypermetabolic solid 2.6 cm anterior right upper lobe pulmonary nodule compatible with prior bronchogenic, no hypermetabolic thoracic lymphadenopathy or distal metastatic disease.  There were nonspecific and mild asymmetric hypermetabolism in the left pontine tonsil and the left tongue base region.  CT surgery recommended lobectomy for his lung mass with possible CABG with LIMA to LAD and MV repair.  He underwent right upper lobe lobectomy in April.  Pathology positive for adenocarcinoma stage Ia.  Serial CT scan was recommended by oncology.  Post procedure, he became very short of breath.  CTA was positive for PE, he was started on Xarelto.  Repeat echocardiogram obtained on 02/15/2017 showed EF 60-65%, persistent MR.  He eventually underwent mitral valve repair with Sorin Carbomedics Annuloglex size 28, LIMA to LAD by Dr. Roxy Manns.   Patient presents today for cardiology office visit.  He was able to walk 2 months yesterday.  He continued to have some degree of exertional dyspnea, however it is improving.  He has upcoming visit with Dr. Roxy Manns prior to starting his cardiac  rehab.  He continued to have some chest soreness.  After his recent discharge, there is one episode of chest discomfort where he felt a electrical shock going down his left arm, it only lasted about 30 seconds before going away.  He denies any exertional chest discomfort.  EKG today continue to show sinus rhythm.  He is now on Coumadin for PE.  He appears to be euvolemic on physical exam, I will do a trial with the patient starting on as needed Lasix instead of scheduled Lasix.  He will also use potassium on a as needed basis when he used Lasix as well.  In the next month, he will need fasting lipid panel and LFT, I doubt the current 20 mg daily of Lipitor will control his LDL very well.  Previous LDL was 170 in February 2018.  However patient says he does not want to increase the Lipitor dosage.  If he is cholesterol is uncontrolled, I will referred the patient to the lipid clinic.  Otherwise he can see [name] in January.  He does have diminished breath sounds in the right base, previous CXR revealed elevated right hemidiaphragm as the cause.  Past Medical History:  Diagnosis Date  . Adenocarcinoma of right lung, stage 1 ([place]) 09/06/2016  . Anxiety   . Arthritis    "knees, hips, back" (10/19/2012)  . Chronic diastolic congestive heart failure ([place])   . Chronic lower back pain   . Colon polyps  10/27/2002, repeat letter 09/17/2007  . Coronary artery disease   . Coronary artery disease involving native coronary artery of native heart without angina pectoris   . Depressive disorder, not elsewhere classified    no meds  . Diabetes mellitus without complication ([place])    diet controlled- no med  (while in hosp 4/18 -elevated cbg  . Dyspnea   . Fasting hyperglycemia   . GERD (gastroesophageal reflux disease)   . Heart murmur   . Hemoptysis    abnormal CT Chest 01/29/10 - ? new GG changes RUL > not viz on plain cxr 02/26/2010  . Hypertension   . Mitral regurgitation    severe MR 08/2016  . MPN  (myeloproliferative neoplasm) ([place])    1st detected 06/04/1998  . Obesity   . OSA on CPAP    last sleep study 10 years ago  . Other and unspecified hyperlipidemia   . Peripheral vascular disease ([place]) 08/2016   after lung surgey small clots in lungs,after hip dvt-5/16  . Pneumonia    4/18  . Positive PPD 1965   "non reactive in 2012" (10/19/2012)  . Routine general medical examination at a health care facility   . S/P CABG x 1 03/24/2017   LIMA to LAD  . S/P mitral valve repair 03/24/2017   Complex valvuloplasty including artificial Gore-tex neochord placement x6 and 28 mm Sorin Annuloflex posterior annuloplasty band  . Special screening for malignant neoplasm of prostate   . Spinal stenosis, unspecified region other than cervical   . Wrist pain, left     Past Surgical History:  Procedure Laterality Date  . ANTERIOR CRUCIATE LIGAMENT REPAIR Left 1967  . CARDIAC CATHETERIZATION  2000  . CHEST TUBE INSERTION Right 10/19/2012   post bronch  . COLONOSCOPY W/ POLYPECTOMY    . CORONARY ARTERY BYPASS GRAFT N/A 03/24/2017   Procedure: CORONARY ARTERY BYPASS GRAFTING (CABG)x1 using left internal mammary artery, LIMA-LAD;  Surgeon: Rexene Alberts, MD;  Location: [place] [facility];  Service: Open Heart Surgery;  Laterality: N/A;  . FLEXIBLE BRONCHOSCOPY  10/19/2012   Flexible video fiberoptic bronchoscopy with electromagnetic navigation and biopsies.  . INTRAVASCULAR PRESSURE WIRE/FFR STUDY N/A 08/11/2016   Procedure: Intravascular Pressure Wire/FFR Study;  Surgeon: [name], MD;  Location: [facility];  Service: Cardiovascular;  Laterality: N/A;  . LOBECTOMY Right 09/02/2016   Procedure: RIGHT UPPER LOBECTOMY;  Surgeon: Melrose Nakayama, MD;  Location: [facility];  Service: Thoracic;  Laterality: Right;  . LYMPH NODE DISSECTION Right 09/02/2016   Procedure: LYMPH NODE DISSECTION, RIGHT LUNG;  Surgeon: Melrose Nakayama, MD;  Location: [facility];  Service: Thoracic;  Laterality: Right;  . MITRAL  VALVE REPAIR N/A 03/24/2017   Procedure: MITRAL VALVE REPAIR (MVR) with Sorin Carbomedics Annuloflex size 28;  Surgeon: Rexene Alberts, MD;  Location: [facility];  Service: Open Heart Surgery;  Laterality: N/A;  . RIGHT/LEFT HEART CATH AND CORONARY ANGIOGRAPHY N/A 08/11/2016   Procedure: Right/Left Heart Cath and Coronary Angiography;  Surgeon: [name], MD;  Location: [facility];  Service: Cardiovascular;  Laterality: N/A;  . TEE WITHOUT CARDIOVERSION N/A 07/17/2016   Procedure: TRANSESOPHAGEAL ECHOCARDIOGRAM (TEE);  Surgeon: [name], MD;  [facility];  Service: Cardiovascular;  Laterality: N/A;  . TEE WITHOUT CARDIOVERSION N/A 03/24/2017   Procedure: TRANSESOPHAGEAL ECHOCARDIOGRAM (TEE);  Surgeon: Rexene Alberts, MD;  Location: [facility] [place];  Service: Open Heart Surgery;  Laterality: N/A;  . TONSILLECTOMY  1950's  .  TOTAL HIP ARTHROPLASTY Left 10/05/2014   dr [name]  . TOTAL HIP ARTHROPLASTY Left 10/05/2014   Procedure: LEFT TOTAL HIP ARTHROPLASTY ANTERIOR APPROACH;  Surgeon: [name], MD;  Location: [place];  Service: Orthopedics;  Laterality: Left;  Marland Kitchen VIDEO ASSISTED THORACOSCOPY (VATS)/WEDGE RESECTION Right 09/02/2016   Procedure: VIDEO ASSISTED THORACOSCOPY (VATS)/RIGHT UPPER LOBE WEDGE RESECTION;  Surgeon: Melrose Nakayama, MD;  Location: [place];  Service: Thoracic;  Laterality: Right;  Marland Kitchen VIDEO BRONCHOSCOPY WITH ENDOBRONCHIAL NAVIGATION N/A 10/19/2012   Procedure: VIDEO BRONCHOSCOPY WITH ENDOBRONCHIAL NAVIGATION;  Surgeon: [name], MD;  Location: [facility];  Service: Thoracic;  Laterality: N/A;  . WRIST RECONSTRUCTION Left 12/2009   'proximal row carpectomy" [name]    Current Medications: Outpatient Medications Prior to Visit  Medication Sig Dispense Refill  . acetaminophen (TYLENOL) 325 MG tablet Take 650 mg by mouth every 6 (six) hours as needed for moderate pain or headache.    Marland Kitchen aspirin EC 81 MG tablet Take 1 tablet (81 mg total) by mouth daily. 90 tablet  3  . atorvastatin (LIPITOR) 20 MG tablet Take 1 tablet (20 mg total) by mouth daily. 90 tablet 3  . metoprolol tartrate (LOPRESSOR) 25 MG tablet Take 0.5 tablets (12.5 mg total) 2 (two) times daily by mouth. 30 tablet 3  . Omega-3 Fatty Acids (FISH OIL) 500 MG CAPS Take 500 mg by mouth daily.     . psyllium (METAMUCIL) 58.6 % packet Take 1 packet by mouth daily.    . sodium chloride (OCEAN) 0.65 % SOLN nasal spray Place 2 sprays into both nostrils at bedtime.    Bethann Humble Sulfate (ALLERGY RELIEF EYE DROPS OP) Apply 2 drops to eye as needed (for allergies).     . traMADol (ULTRAM) 50 MG tablet Take 1 tablet (50 mg total) by mouth every 6 (six) hours as needed (may take one or two tablets every six hrs prn). 30 tablet 0  . Undecylenic Ac-Zn Undecylenate (FUNGI-NAIL TOE & FOOT EX) Apply 1 application topically daily as needed (for nail).     . warfarin (COUMADIN) 5 MG tablet Take 1 tablet (5 mg total) daily at 6 PM by mouth. 30 tablet 3  . furosemide (LASIX) 40 MG tablet TAKE 1 TABLET BY MOUTH EVERY DAY (Patient taking differently: TAKE 40 MG BY MOUTH EVERY DAY) 90 tablet 3  . potassium chloride SA (K-DUR,KLOR-CON) 20 MEQ tablet Take 1 tablet (20 mEq total) by mouth daily. 30 tablet 1   No facility-administered medications prior to visit.      Allergies:   Symbicort [budesonide-formoterol fumarate] and Amoxicillin   Social History   Socioeconomic History  . Marital status: Married    Spouse name: None  . Number of children: 3  . Years of education: None  . Highest education level: None  Social Needs  . Financial resource strain: None  . Food insecurity - worry: None  . Food insecurity - inability: None  . Transportation needs - medical: None  . Transportation needs - non-medical: None  Occupational History  . Occupation: Retired, Financial risk analyst  . Occupation: Retired, Real estate    Comment: Slow  Tobacco Use  . Smoking status: Never Smoker  . Smokeless tobacco: Never  Used  Substance and Sexual Activity  . Alcohol use: Yes    Alcohol/week: 0.6 oz    Types: 1 Cans of beer per week    Comment: none since 3 weeks  . Drug use: No  . Sexual activity: No  Partners: Female  Other Topics Concern  . None  Social History Narrative   HSG, [employer]. Married '73.  2 sons - '74,   '76;  1 daughter -  '77  6 grandchildren.   Work - IT consultant now retired. ACP - not fully discussed.                     Family History:  The patient's family history includes Heart attack in his father; Heart disease in his father and paternal uncle; Heart failure in his mother.   ROS:   Please see the history of present illness.    ROS All other systems reviewed and are negative.   PHYSICAL EXAM:   VS:  BP 106/64   Pulse 75   Ht 5\' 9"  (1.753 m)   Wt 224 lb 6.4 oz (101.8 kg)   BMI 33.14 kg/m    GEN: Well nourished, well developed, in no acute distress  HEENT: normal  Neck: no JVD, carotid bruits, or masses Cardiac: RRR; no murmurs, rubs, or gallops,no edema  Respiratory:  clear to auscultation bilaterally, normal work of breathing + decreased breath sounds in the right base GI: soft, nontender, nondistended, + BS MS: no deformity or atrophy  Skin: warm and dry, no rash Neuro:  Alert and Oriented x 3, Strength and sensation are intact Psych: euthymic mood, full affect  Wt Readings from Last 3 Encounters:  04/14/17 224 lb 6.4 oz (101.8 kg)  04/07/17 221 lb 4.8 oz (100.4 kg)  03/30/17 226 lb 3.1 oz (102.6 kg)      Studies/Labs Reviewed:   EKG:  EKG is ordered today.  The ekg ordered today demonstrates sinus rhythm without significant ST-T wave changes.  Recent Labs: 07/27/2016: TSH 2.14 02/08/2017: Brain Natriuretic Peptide 4 03/22/2017: ALT 36 03/25/2017: Magnesium 2.3 03/29/2017: BUN 13; Creatinine, Ser 1.01; Hemoglobin 10.1; Platelets 179; Potassium 4.3; Sodium 137   Lipid Panel    Component Value Date/Time   CHOL  245 (H) 06/29/2016 1153   TRIG 198.0 (H) 06/29/2016 1153   TRIG 108 05/20/2006 0825   HDL 35.90 (L) 06/29/2016 1153   CHOLHDL 7 06/29/2016 1153   VLDL 39.6 06/29/2016 1153   LDLCALC 170 (H) 06/29/2016 1153   LDLDIRECT 177.0 07/24/2015 1052    Additional studies/ records that were reviewed today include:    Cath 08/11/2016 Conclusion     Prox LAD to Mid LAD lesion, 30 %stenosed.  Mid LAD to Dist LAD lesion, 65 %stenosed.  Prox RCA to Mid RCA lesion, 15 %stenosed.  The left ventricular systolic function is normal.  LV end diastolic pressure is normal.  The left ventricular ejection fraction is 55-65% by visual estimate.  LV end diastolic pressure is normal.   1. Borderline single vessel obstructive CAD involving the mid LAD. FFR 0.81 2. Normal LV function EF 65% 3. Normal right heart and LV filling pressures 4. Normal Cardiac output  Plan: will refer to CT surgery for consideration of MV repair. The stenosis in the LAD will be discussed. He is asymptomatic and I would favor treating it medically. If he develops symptoms in the future it could be treated with PCI.        Echo 02/15/2017 LV EF: 60% -   65%  Study Conclusions  - Left ventricle: The cavity size was normal. Wall thickness was   normal. Systolic function was normal. The estimated ejection   fraction was in the range of 60% to  65%. - Aortic valve: AV is thickened, calcified with minimally   restricted motion. - Mitral valve: Prolapse fo the posterior mitral leaflet. MR is at   least moderate in intensity Calcified annulus. Mildly thickened   leaflets . - Left atrium: The atrium was mildly dilated.    CABG with MVR 03/24/2017 Preoperative Diagnosis:        Severe Mitral Regurgitation  Single-vessel Coronary Artery Disease  Postoperative Diagnosis:    Same  Procedure:        Mitral Valve Repair             Complex valvuloplasty including artificial Gore-tex neochord placement x6              Sorin Carbomedics Annuloflex posterior annuloplasty band (size 67mm, catalog #[id number], serial [id number])   Coronary Artery Bypass Grafting x 1              Left Internal Mammary Artery to Distal Left Anterior Descending Coronary Artery     ASSESSMENT:    1. Status post mitral valve annuloplasty   2. Hyperlipidemia, unspecified hyperlipidemia type   3. S/P CABG x 1   4. Chronic diastolic heart failure ([place])   5. Controlled type 2 diabetes mellitus without complication, without long-term current use of insulin ([place])   6. Other pulmonary embolism without acute cor pulmonale, unspecified chronicity ([place])   7. Acquired elevated hemidiaphragm      PLAN:  In order of problems listed above:  1. Severe MR s/p mitral valve annuloplasty: No significant heart murmur on physical exam.  Only has mild dyspnea with exertion.  He was able to ambulate at least 2 miles yesterday without any issue.  Pending upcoming visit with Dr. Roxy Manns  2. Hyperlipidemia: Patient is unwilling to increase his statin.  He is due for fasting lipid panel and LFT next month.  If cholesterol remains elevated, I will referred the patient to the lipid clinic.  3. Chronic diastolic heart failure: We will make Lasix and potassium on a as needed basis.  He has been instructed to contact us if he started having worsening swelling that require more frequent Lasix.  4. S/p CABG x 1: Although he had one episode of chest discomfort after recent surgery, however it only lasted 30 seconds and appears to be quite atypical.  No additional workup at this time.  5. DM 2: Managed by primary care provider  6. History of PE on Coumadin: Coumadin clinic visit today.    Medication Adjustments/Labs and Tests Ordered: Current medicines are reviewed at length with the patient today.  Concerns regarding medicines are outlined above.  Medication changes, Labs and Tests ordered today are listed in the Patient Instructions below. Patient  Instructions  Medication Instructions:   TAKE lasix and potassium AS NEEDED  Labwork:  FASTING lab work in 1 month to check cholesterol & liver function  Testing/Procedures:  NONE  Follow-Up:  Follow up with [name] as scheduled May 31, 2017  If you need a refill on your cardiac medications before your next appointment, please call your pharmacy.  Any Other Special Instructions Will Be Listed Below (If Applicable).       Hilbert Corrigan, [place]  04/15/2017 8:27 PM    [facility] [place], [place], [place]  [address] Phone: [phone]; Fax: [phone]

## 2017-04-14 NOTE — Patient Instructions (Addendum)
Medication Instructions:   TAKE lasix and potassium AS NEEDED  Labwork:  FASTING lab work in 1 month to check cholesterol & liver function  Testing/Procedures:  NONE  Follow-Up:  Follow up with [name] as scheduled May 31, 2017  If you need a refill on your cardiac medications before your next appointment, please call your pharmacy.  Any Other Special Instructions Will Be Listed Below (If Applicable).

## 2017-04-15 ENCOUNTER — Telehealth (HOSPITAL_COMMUNITY): Payer: Self-pay

## 2017-04-15 ENCOUNTER — Other Ambulatory Visit: Payer: Self-pay | Admitting: Thoracic Surgery (Cardiothoracic Vascular Surgery)

## 2017-04-15 ENCOUNTER — Encounter: Payer: Self-pay | Admitting: Physician Assistant

## 2017-04-15 DIAGNOSIS — J986 Disorders of diaphragm: Secondary | ICD-10-CM | POA: Insufficient documentation

## 2017-04-15 DIAGNOSIS — G8918 Other acute postprocedural pain: Secondary | ICD-10-CM

## 2017-04-15 NOTE — Telephone Encounter (Signed)
Called patient in regards to cardiac rehab - Scheduled orientation on 05/18/2017 at 1:30pm. Patient will attend the 2:45pm exc class.

## 2017-04-15 NOTE — Telephone Encounter (Signed)
Attempted to call patient in regards to Cardiac Rehab - Lm on Vm °

## 2017-04-16 ENCOUNTER — Other Ambulatory Visit: Payer: Self-pay

## 2017-04-16 DIAGNOSIS — G8918 Other acute postprocedural pain: Secondary | ICD-10-CM

## 2017-04-16 MED ORDER — TRAMADOL HCL 50 MG PO TABS: 50.0000 mg | ORAL_TABLET | Freq: Four times a day (QID) | ORAL | 0 refills | Status: DC | PRN

## 2017-04-19 ENCOUNTER — Ambulatory Visit: Payer: Self-pay | Admitting: Thoracic Surgery (Cardiothoracic Vascular Surgery)

## 2017-04-21 ENCOUNTER — Telehealth: Payer: Self-pay | Admitting: Emergency Medicine

## 2017-04-21 ENCOUNTER — Ambulatory Visit: Payer: Medicare Other | Admitting: Cardiology

## 2017-04-21 NOTE — Telephone Encounter (Signed)
I have had to call his old homecare company to get a copy of his sleep study I will fax once we receive it

## 2017-04-21 NOTE — Telephone Encounter (Signed)
Routing to PCCs to follow up on as this is a CPAP order.

## 2017-04-22 NOTE — Telephone Encounter (Signed)
I have still not received the old study from his old company without this sleep study im unable to change his company because they need his sleep study.

## 2017-04-23 ENCOUNTER — Ambulatory Visit (INDEPENDENT_AMBULATORY_CARE_PROVIDER_SITE_OTHER): Payer: Medicare Other | Admitting: Pharmacist Clinician (PhC)/ Clinical Pharmacy Specialist

## 2017-04-23 DIAGNOSIS — Z7901 Long term (current) use of anticoagulants: Secondary | ICD-10-CM | POA: Diagnosis not present

## 2017-04-23 DIAGNOSIS — Z9889 Other specified postprocedural states: Secondary | ICD-10-CM

## 2017-04-23 LAB — POCT INR: INR: 3

## 2017-04-27 NOTE — Telephone Encounter (Signed)
I called [facility] again yesterday and asked for them to refax the sleep study I still have not gotten it yet.  Has the nurse got it in her look at by chance?

## 2017-04-27 NOTE — Telephone Encounter (Signed)
I do not have this sleep study.

## 2017-04-28 NOTE — Telephone Encounter (Signed)
Pt calling saying [facility] is asking him for his baseline sleep study results and the face to face notes from his last visit with [name].

## 2017-04-28 NOTE — Telephone Encounter (Signed)
I have checked James [name]'s look at and the up front fax. We still do not have the pt's sleep study.

## 2017-04-28 NOTE — Telephone Encounter (Signed)
We received the info needed on this pt from Dr Berle Mull office and this has been refaxed to [facility]

## 2017-04-30 ENCOUNTER — Other Ambulatory Visit: Payer: Self-pay | Admitting: Pharmacist Clinician (PhC)/ Clinical Pharmacy Specialist

## 2017-04-30 ENCOUNTER — Ambulatory Visit (INDEPENDENT_AMBULATORY_CARE_PROVIDER_SITE_OTHER): Payer: Medicare Other | Admitting: Pharmacist Clinician (PhC)/ Clinical Pharmacy Specialist

## 2017-04-30 DIAGNOSIS — Z7901 Long term (current) use of anticoagulants: Secondary | ICD-10-CM

## 2017-04-30 DIAGNOSIS — Z9889 Other specified postprocedural states: Secondary | ICD-10-CM

## 2017-04-30 LAB — POCT INR: INR: 2.7

## 2017-04-30 MED ORDER — WARFARIN SODIUM 5 MG PO TABS: ORAL_TABLET | ORAL | 3 refills | Status: DC

## 2017-04-30 NOTE — Patient Instructions (Signed)
Description   Take 1.5 tablets daily except 2 tablets every Wednesday. Repeat INR in 2 week

## 2017-05-07 ENCOUNTER — Encounter: Payer: Self-pay | Admitting: Emergency Medicine

## 2017-05-14 ENCOUNTER — Ambulatory Visit (INDEPENDENT_AMBULATORY_CARE_PROVIDER_SITE_OTHER): Payer: Medicare Other | Admitting: Pharmacist Clinician (PhC)/ Clinical Pharmacy Specialist

## 2017-05-14 ENCOUNTER — Other Ambulatory Visit: Payer: Self-pay | Admitting: Thoracic Surgery (Cardiothoracic Vascular Surgery)

## 2017-05-14 ENCOUNTER — Telehealth (HOSPITAL_COMMUNITY): Payer: Self-pay

## 2017-05-14 DIAGNOSIS — Z7901 Long term (current) use of anticoagulants: Secondary | ICD-10-CM | POA: Diagnosis not present

## 2017-05-14 DIAGNOSIS — Z951 Presence of aortocoronary bypass graft: Secondary | ICD-10-CM

## 2017-05-14 DIAGNOSIS — Z9889 Other specified postprocedural states: Secondary | ICD-10-CM

## 2017-05-14 LAB — POCT INR: INR: 2.4

## 2017-05-14 NOTE — Progress Notes (Unsigned)
cx

## 2017-05-14 NOTE — Patient Instructions (Signed)
Description   Take 1.5 tablets daily except 2 tablets every Wednesday. Repeat INR in 2 week

## 2017-05-17 ENCOUNTER — Ambulatory Visit (INDEPENDENT_AMBULATORY_CARE_PROVIDER_SITE_OTHER): Payer: Self-pay | Admitting: Surgical

## 2017-05-17 ENCOUNTER — Ambulatory Visit
Admission: RE | Admit: 2017-05-17 | Discharge: 2017-05-17 | Disposition: A | Discharge status: Home / Self Care | Payer: Medicare Other | Source: Ambulatory Visit | Attending: Thoracic Surgery (Cardiothoracic Vascular Surgery) | Admitting: Thoracic Surgery (Cardiothoracic Vascular Surgery)

## 2017-05-17 ENCOUNTER — Telehealth: Payer: Self-pay

## 2017-05-17 ENCOUNTER — Other Ambulatory Visit: Payer: Self-pay

## 2017-05-17 ENCOUNTER — Telehealth: Payer: Self-pay | Admitting: Emergency Medicine

## 2017-05-17 VITALS — BP 137/80 | HR 79 | Resp 20 | Ht 69.0 in | Wt 224.0 lb

## 2017-05-17 DIAGNOSIS — E785 Hyperlipidemia, unspecified: Secondary | ICD-10-CM

## 2017-05-17 DIAGNOSIS — G4733 Obstructive sleep apnea (adult) (pediatric): Secondary | ICD-10-CM

## 2017-05-17 DIAGNOSIS — Z9989 Dependence on other enabling machines and devices: Secondary | ICD-10-CM

## 2017-05-17 DIAGNOSIS — I5032 Chronic diastolic (congestive) heart failure: Secondary | ICD-10-CM

## 2017-05-17 DIAGNOSIS — I340 Nonrheumatic mitral (valve) insufficiency: Secondary | ICD-10-CM

## 2017-05-17 DIAGNOSIS — Z951 Presence of aortocoronary bypass graft: Secondary | ICD-10-CM

## 2017-05-17 NOTE — Telephone Encounter (Signed)
Called patient to inform him that Isaac Laud wanted him to have 1 month fating labs to recheck his lipids and lft.  No answer. Left a message advising patient to contact office. Orders for labs are in chart.

## 2017-05-17 NOTE — Progress Notes (Signed)
erro  neous encounter

## 2017-05-17 NOTE — Telephone Encounter (Signed)
Lmtcb x1 for [employer] with [facility]

## 2017-05-17 NOTE — Progress Notes (Signed)
[address]       [name] [address]             [phone]      [name] [facility] Medical Record #[id number] Date of Birth: 11-Mar-1951  Referring: James, Peter M, MD Primary Care: James Lima, MD Primary Cardiologist: No primary care provider on file.   Chief Complaint:   POST OP FOLLOW UP CARDIOTHORACIC SURGERY OPERATIVE NOTE  Date of Procedure:                03/24/2017  Preoperative Diagnosis:        Severe Mitral Regurgitation  Single-vessel Coronary Artery Disease  Postoperative Diagnosis:    Same  Procedure:        Mitral Valve Repair             Complex valvuloplasty including artificial Gore-tex neochord placement x6             Sorin Carbomedics Annuloflex posterior annuloplasty band (size 47mm, catalog #[id number], serial [id number])   Coronary Artery Bypass Grafting x 1              Left Internal Mammary Artery to Distal Left Anterior Descending Coronary Artery  Surgeon:        James [name]. James Manns, MD  Assistant:       James Rough, PA-C  Anesthesia:    Roberts Gaudy, MD  Operative Findings:  Normal LV systolic function  Fibroelastic deficiency type myxomatous degenerative disease with multiple ruptured primary chordae tendinae and flail segment (P3) of posterior leaflet  Type II dysfunction with severe mitral regurgitation  Good quality [name] conduit for grafting  Good quality LAD target vessel for grafting  Trace residual mitral regurgitation after successful valve repair     History of Present Illness:    The patient is a 67 year old male status post the above described procedures seen in the office on today's date in routine surgical follow-up.  He describes some shortness of breath with activity but overall is feeling pretty well.  He does get peripheral edema and takes intermittent Lasix.  He does have some discomfort on the left breast in the region of the mammary artery harvest.  He has not had  any wound problems.  He denies fevers, chills or other constitutional symptoms.  He has been seen in cardiology follow-up.  He is scheduled to start cardiac rehab tomorrow.      Past Medical History:  Diagnosis Date  . Adenocarcinoma of right lung, stage 1 ([place]) 09/06/2016  . Anxiety   . Arthritis    "knees, hips, back" (10/19/2012)  . Chronic diastolic congestive heart failure ([place])   . Chronic lower back pain   . Colon polyps    10/27/2002, repeat letter 09/17/2007  . Coronary artery disease   . Coronary artery disease involving native coronary artery of native heart without angina pectoris   . Depressive disorder, not elsewhere classified    no meds  . Diabetes mellitus without complication ([place])    diet controlled- no med  (while in hosp 4/18 -elevated cbg  . Dyspnea   . Fasting hyperglycemia   . GERD (gastroesophageal reflux disease)   . Heart murmur   . Hemoptysis    abnormal CT Chest 01/29/10 - ? new GG changes RUL > not viz on plain cxr 02/26/2010  . Hypertension   . Mitral regurgitation    severe MR 08/2016  . MPN (myeloproliferative neoplasm) ([place])  1st detected 06/04/1998  . Obesity   . OSA on CPAP    last sleep study 10 years ago  . Other and unspecified hyperlipidemia   . Peripheral vascular disease ([place]) 08/2016   after lung surgey small clots in lungs,after hip dvt-5/16  . Pneumonia    4/18  . Positive PPD 1965   "non reactive in 2012" (10/19/2012)  . Routine general medical examination at a health care facility   . S/P CABG x 1 03/24/2017   [name] to LAD  . S/P mitral valve repair 03/24/2017   Complex valvuloplasty including artificial Gore-tex neochord placement x6 and 28 mm Sorin Annuloflex posterior annuloplasty band  . Special screening for malignant neoplasm of prostate   . Spinal stenosis, unspecified region other than cervical   . Wrist pain, left      Social History   Tobacco Use  Smoking Status Never Smoker  Smokeless Tobacco Never Used      Social History   Substance and Sexual Activity  Alcohol Use Yes  . Alcohol/week: 0.6 oz  . Types: 1 Cans of beer per week   Comment: none since 3 weeks     Allergies  Allergen Reactions  . Symbicort [Budesonide-Formoterol Fumarate] Other (See Comments) and Cough    Chest pain, wheezing   . Amoxicillin Itching and Other (See Comments)    Has patient had a PCN reaction causing immediate rash, facial/tongue/throat swelling, SOB or lightheadedness with hypotension: No Has patient had a PCN reaction causing severe rash involving mucus membranes or skin necrosis: No Has patient had a PCN reaction that required hospitalization No Has patient had a PCN reaction occurring within the last 10 years: No If all of the above answers are "NO", then may proceed with Cephalosporin use.     Current Outpatient Medications  Medication Sig Dispense Refill  . acetaminophen (TYLENOL) 325 MG tablet Take 650 mg by mouth every 6 (six) hours as needed for moderate pain or headache.    Marland Kitchen aspirin EC 81 MG tablet Take 1 tablet (81 mg total) by mouth daily. 90 tablet 3  . atorvastatin (LIPITOR) 20 MG tablet Take 1 tablet (20 mg total) by mouth daily. 90 tablet 3  . furosemide (LASIX) 40 MG tablet Take 1 tablet (40 mg total) by mouth daily as needed. 90 tablet 0  . metoprolol tartrate (LOPRESSOR) 25 MG tablet Take 0.5 tablets (12.5 mg total) 2 (two) times daily by mouth. 30 tablet 3  . Omega-3 Fatty Acids (FISH OIL) 500 MG CAPS Take 500 mg by mouth daily.     . potassium chloride SA (K-DUR,KLOR-CON) 20 MEQ tablet Take 1 tablet (20 mEq total) by mouth daily as needed. With lasix 90 tablet 0  . psyllium (METAMUCIL) 58.6 % packet Take 1 packet by mouth daily.    . sodium chloride (OCEAN) 0.65 % SOLN nasal spray Place 2 sprays into both nostrils at bedtime.    Bethann Humble Sulfate (ALLERGY RELIEF EYE DROPS OP) Apply 2 drops to eye as needed (for allergies).     . traMADol (ULTRAM) 50 MG tablet Take 1  tablet (50 mg total) by mouth every 6 (six) hours as needed (may take one or two tablets every six hrs prn). 30 tablet 0  . Undecylenic Ac-Zn Undecylenate (FUNGI-NAIL TOE & FOOT EX) Apply 1 application topically daily as needed (for nail).     . warfarin (COUMADIN) 5 MG tablet Take 1.5 to 2 tablets by mouth daily as directed by coumadin clinic  50 tablet 3   No current facility-administered medications for this visit.        Physical Exam: BP 137/80   Pulse 79   Resp 20   Ht 5\' 9"  (1.753 [name])   Wt 224 lb (101.6 kg)   SpO2 97% Comment: RA  BMI 33.08 kg/[name]   General appearance: alert, cooperative and no distress Heart: regular rate and rhythm and Soft systolic murmur Lungs: clear to auscultation bilaterally Extremities: Bilateral lower extremity edema Incisions: Healing well without evidence of infection  Diagnostic Studies & Laboratory data:     Recent Radiology Findings:   Dg Chest 2 View  Result Date: 05/17/2017 CLINICAL DATA:  Chest pain and shortness of breath EXAM: CHEST  2 VIEW COMPARISON:  Chest radiograph 03/27/2017 FINDINGS: Unchanged sequelae of mitral valve replacement and CABG. Status post right upper lobectomy. No focal airspace consolidation. No pleural effusion or pneumothorax. Right axillary surgical clips. Normal cardiomediastinal contours. IMPRESSION: No active cardiopulmonary disease. Electronically Signed   By: Ulyses Jarred [name].D.   On: 05/17/2017 14:14      Recent Lab Findings: Lab Results  Component Value Date   WBC 7.1 03/29/2017   HGB 10.1 (L) 03/29/2017   HCT 31.1 (L) 03/29/2017   PLT 179 03/29/2017   GLUCOSE 107 (H) 03/29/2017   CHOL 245 (H) 06/29/2016   TRIG 198.0 (H) 06/29/2016   HDL 35.90 (L) 06/29/2016   LDLDIRECT 177.0 07/24/2015   LDLCALC 170 (H) 06/29/2016   ALT 36 03/22/2017   AST 31 03/22/2017   NA 137 03/29/2017   K 4.3 03/29/2017   CL 102 03/29/2017   CREATININE 1.01 03/29/2017   BUN 13 03/29/2017   CO2 30 03/29/2017   TSH 2.14  07/27/2016   INR 2.4 05/14/2017   HGBA1C 6.5 (H) 03/22/2017      Assessment / Plan: Patient continues to do well and is progressing in his surgical recovery.  I encouraged him to incorporate significant nutrition and lifestyle modifications for long-term disease prevention.  He understands and is willing to make changes.  He will start cardiac rehab tomorrow.  We will see him again in the office in 2 months.         John Giovanni, PA-C 05/17/2017 2:27 PM

## 2017-05-17 NOTE — Patient Instructions (Signed)
Discussed routine activity progression including driving.

## 2017-05-18 ENCOUNTER — Encounter (HOSPITAL_COMMUNITY)
Admission: RE | Admit: 2017-05-18 | Discharge: 2017-05-18 | Disposition: A | Discharge status: Home / Self Care | Payer: Medicare Other | Source: Ambulatory Visit | Attending: Cardiology | Admitting: Cardiology

## 2017-05-18 ENCOUNTER — Encounter (HOSPITAL_COMMUNITY): Payer: Self-pay

## 2017-05-18 VITALS — Ht 67.0 in | Wt 225.3 lb

## 2017-05-18 DIAGNOSIS — I110 Hypertensive heart disease with heart failure: Secondary | ICD-10-CM | POA: Insufficient documentation

## 2017-05-18 DIAGNOSIS — F329 Major depressive disorder, single episode, unspecified: Secondary | ICD-10-CM | POA: Diagnosis not present

## 2017-05-18 DIAGNOSIS — Z79899 Other long term (current) drug therapy: Secondary | ICD-10-CM | POA: Insufficient documentation

## 2017-05-18 DIAGNOSIS — I2510 Atherosclerotic heart disease of native coronary artery without angina pectoris: Secondary | ICD-10-CM | POA: Insufficient documentation

## 2017-05-18 DIAGNOSIS — Z7982 Long term (current) use of aspirin: Secondary | ICD-10-CM | POA: Insufficient documentation

## 2017-05-18 DIAGNOSIS — M1990 Unspecified osteoarthritis, unspecified site: Secondary | ICD-10-CM | POA: Diagnosis not present

## 2017-05-18 DIAGNOSIS — Z9889 Other specified postprocedural states: Secondary | ICD-10-CM | POA: Diagnosis present

## 2017-05-18 DIAGNOSIS — K219 Gastro-esophageal reflux disease without esophagitis: Secondary | ICD-10-CM | POA: Diagnosis not present

## 2017-05-18 DIAGNOSIS — I739 Peripheral vascular disease, unspecified: Secondary | ICD-10-CM | POA: Diagnosis not present

## 2017-05-18 DIAGNOSIS — F419 Anxiety disorder, unspecified: Secondary | ICD-10-CM | POA: Insufficient documentation

## 2017-05-18 DIAGNOSIS — E669 Obesity, unspecified: Secondary | ICD-10-CM | POA: Diagnosis not present

## 2017-05-18 DIAGNOSIS — Z7901 Long term (current) use of anticoagulants: Secondary | ICD-10-CM | POA: Insufficient documentation

## 2017-05-18 DIAGNOSIS — G4733 Obstructive sleep apnea (adult) (pediatric): Secondary | ICD-10-CM | POA: Diagnosis not present

## 2017-05-18 DIAGNOSIS — Z951 Presence of aortocoronary bypass graft: Secondary | ICD-10-CM | POA: Diagnosis present

## 2017-05-18 DIAGNOSIS — E119 Type 2 diabetes mellitus without complications: Secondary | ICD-10-CM | POA: Insufficient documentation

## 2017-05-18 DIAGNOSIS — C3491 Malignant neoplasm of unspecified part of right bronchus or lung: Secondary | ICD-10-CM | POA: Diagnosis not present

## 2017-05-18 DIAGNOSIS — I5032 Chronic diastolic (congestive) heart failure: Secondary | ICD-10-CM | POA: Insufficient documentation

## 2017-05-18 NOTE — Progress Notes (Signed)
Cardiac Individual Treatment Plan  Patient Details  Name: DEMONTAY GRANTHAM MRN: [id number] Date of Birth: 10/14/1950 Referring Provider:     [place] from 05/18/2017 in [place]  Referring Provider  Martinique, Peter MD      Initial Encounter Date:    CARDIAC REHAB PHASE II ORIENTATION from 05/18/2017 in [place]  Date  05/18/17  Referring Provider  Martinique, Peter MD      Visit Diagnosis: S/P CABG x 1  S/P MVR (mitral valve repair)  Patient's Home Medications on Admission:  Current Outpatient Medications:  .  acetaminophen (TYLENOL) 325 MG tablet, Take 650 mg by mouth every 6 (six) hours as needed for moderate pain or headache., Disp: , Rfl:  .  aspirin EC 81 MG tablet, Take 1 tablet (81 mg total) by mouth daily., Disp: 90 tablet, Rfl: 3 .  atorvastatin (LIPITOR) 20 MG tablet, Take 1 tablet (20 mg total) by mouth daily., Disp: 90 tablet, Rfl: 3 .  furosemide (LASIX) 40 MG tablet, Take 1 tablet (40 mg total) by mouth daily as needed., Disp: 90 tablet, Rfl: 0 .  metoprolol tartrate (LOPRESSOR) 25 MG tablet, Take 0.5 tablets (12.5 mg total) 2 (two) times daily by mouth., Disp: 30 tablet, Rfl: 3 .  Omega-3 Fatty Acids (FISH OIL) 500 MG CAPS, Take 500 mg by mouth daily. , Disp: , Rfl:  .  potassium chloride SA (K-DUR,KLOR-CON) 20 MEQ tablet, Take 1 tablet (20 mEq total) by mouth daily as needed. With lasix, Disp: 90 tablet, Rfl: 0 .  psyllium (METAMUCIL) 58.6 % packet, Take 1 packet by mouth daily., Disp: , Rfl:  .  sodium chloride (OCEAN) 0.65 % SOLN nasal spray, Place 2 sprays into both nostrils at bedtime., Disp: , Rfl:  .  Tetrahydrozoline-Zn Sulfate (ALLERGY RELIEF EYE DROPS OP), Apply 2 drops to eye as needed (for allergies). , Disp: , Rfl:  .  traMADol (ULTRAM) 50 MG tablet, Take 1 tablet (50 mg total) by mouth every 6 (six) hours as needed (may take one or two tablets every six hrs prn)., Disp: 30  tablet, Rfl: 0 .  Undecylenic Ac-Zn Undecylenate (FUNGI-NAIL TOE & FOOT EX), Apply 1 application topically daily as needed (for nail). , Disp: , Rfl:  .  warfarin (COUMADIN) 5 MG tablet, Take 1.5 to 2 tablets by mouth daily as directed by coumadin clinic, Disp: 50 tablet, Rfl: 3  Past Medical History: Past Medical History:  Diagnosis Date  . Adenocarcinoma of right lung, stage 1 ([place]) 09/06/2016  . Anxiety   . Arthritis    "knees, hips, back" (10/19/2012)  . Chronic diastolic congestive heart failure ([place])   . Chronic lower back pain   . Colon polyps    10/27/2002, repeat letter 09/17/2007  . Coronary artery disease   . Coronary artery disease involving native coronary artery of native heart without angina pectoris   . Depressive disorder, not elsewhere classified    no meds  . Diabetes mellitus without complication ([place])    diet controlled- no med  (while in hosp 4/18 -elevated cbg  . Dyspnea   . Fasting hyperglycemia   . GERD (gastroesophageal reflux disease)   . Heart murmur   . Hemoptysis    abnormal CT Chest 01/29/10 - ? new GG changes RUL > not viz on plain cxr 02/26/2010  . Hypertension   . Mitral regurgitation    severe MR 08/2016  . MPN (myeloproliferative  neoplasm) ([place])    1st detected 06/04/1998  . Obesity   . OSA on CPAP    last sleep study 10 years ago  . Other and unspecified hyperlipidemia   . Peripheral vascular disease ([place]) 08/2016   after lung surgey small clots in lungs,after hip dvt-5/16  . Pneumonia    4/18  . Positive PPD 1965   "non reactive in 2012" (10/19/2012)  . Routine general medical examination at a health care facility   . S/P CABG x 1 03/24/2017   LIMA to LAD  . S/P mitral valve repair 03/24/2017   Complex valvuloplasty including artificial Gore-tex neochord placement x6 and 28 mm Sorin Annuloflex posterior annuloplasty band  . Special screening for malignant neoplasm of prostate   . Spinal stenosis, unspecified region other than cervical   .  Wrist pain, left     Tobacco Use: Social History   Tobacco Use  Smoking Status Never Smoker  Smokeless Tobacco Never Used    Labs: Recent Review Flowsheet Data    Labs for ITP Cardiac and Pulmonary Rehab Latest Ref Rng & Units 03/24/2017 03/24/2017 03/24/2017 03/24/2017 03/25/2017   Cholestrol 0 - 200 mg/dL - - - - -   LDLCALC 0 - 99 mg/dL - - - - -   LDLDIRECT mg/dL - - - - -   HDL >39.00 mg/dL - - - - -   Trlycerides 0.0 - 149.0 mg/dL - - - - -   Hemoglobin A1c 4.8 - 5.6 % - - - - -   PHART 7.350 - 7.450 7.334(L) 7.353 7.369 - -   PCO2ART 32.0 - 48.0 mmHg 42.8 38.9 38.1 - -   HCO3 20.0 - 28.0 mmol/L 23.0 21.8 22.0 - -   TCO2 22 - 32 mmol/L 24 23 23  21(L) 21(L)   ACIDBASEDEF 0.0 - 2.0 mmol/L 3.0(H) 4.0(H) 3.0(H) - -   O2SAT % 99.0 99.0 98.0 - -      Capillary Blood Glucose: Lab Results  Component Value Date   GLUCAP 89 03/29/2017   GLUCAP 103 (H) 03/28/2017   GLUCAP 131 (H) 03/28/2017   GLUCAP 112 (H) 03/28/2017   GLUCAP 89 03/28/2017     Exercise Target Goals: Date: 05/18/17  Exercise Program Goal: Individual exercise prescription set with THRR, safety & activity barriers. Participant demonstrates ability to understand and report RPE using BORG scale, to self-measure pulse accurately, and to acknowledge the importance of the exercise prescription.  Exercise Prescription Goal: Starting with aerobic activity 30 plus minutes a day, 3 days per week for initial exercise prescription. Provide home exercise prescription and guidelines that participant acknowledges understanding prior to discharge.  Activity Barriers & Risk Stratification: Activity Barriers & Cardiac Risk Stratification - 05/18/17 1355      Activity Barriers & Cardiac Risk Stratification   Activity Barriers  Joint Problems;Deconditioning;Muscular Weakness;Shortness of Breath;Incisional Pain;Other (comment);Left Hip Replacement    Comments  L knee pain ( possible surgery), L wrist surgery in July of  2011    Cardiac Risk Stratification  High       6 Minute Walk: 6 Minute Walk    Row Name 02/02/17 1531 05/18/17 1525       6 Minute Walk   Phase  Discharge  Initial    Distance  1700 feet  1276 feet    Distance Feet Change  153 ft  -    Walk Time  6 minutes  6 minutes    # of Rest Breaks  0  0    MPH  3.21  2.4    METS  3.45  2.8    RPE  14  17    Perceived Dyspnea   4  3    Symptoms  No  Yes (comment)    Comments  -  SOB    Resting HR  91 bpm  80 bpm    Resting BP  134/72  140/81    Resting Oxygen Saturation   98 %  98 %    Exercise Oxygen Saturation  during 6 min walk  0 %  91 %    Max Ex. HR  153 bpm  115 bpm    Max Ex. BP  180/82  156/92    2 Minute Post BP  144/70  142/84      Interval HR   1 Minute HR  131  -    2 Minute HR  132  -    3 Minute HR  139  -    4 Minute HR  133  -    5 Minute HR  153  -    6 Minute HR  153  -    2 Minute Post HR  101  -    Interval Heart Rate?  Yes  -      Interval Oxygen   Interval Oxygen?  Yes  -    Baseline Oxygen Saturation %  98 %  -    1 Minute Oxygen Saturation %  97 %  -    1 Minute Liters of Oxygen  0 L  -    2 Minute Oxygen Saturation %  94 %  -    2 Minute Liters of Oxygen  0 L  -    3 Minute Oxygen Saturation %  93 %  -    3 Minute Liters of Oxygen  0 L  -    4 Minute Oxygen Saturation %  91 %  -    4 Minute Liters of Oxygen  0 L  -    5 Minute Oxygen Saturation %  90 %  -    5 Minute Liters of Oxygen  0 L  -    6 Minute Oxygen Saturation %  90 %  -    6 Minute Liters of Oxygen  0 L  -    2 Minute Post Oxygen Saturation %  98 %  -    2 Minute Post Liters of Oxygen  0 L  -       Oxygen Initial Assessment:   Oxygen Re-Evaluation: Oxygen Re-Evaluation    Row Name 01/06/17 2129 01/29/17 1326           Program Oxygen Prescription   Program Oxygen Prescription  None  None        Home Oxygen   Home Oxygen Device  None  None      Sleep Oxygen Prescription  None  None      Home Exercise Oxygen  Prescription  None  None      Home at Rest Exercise Oxygen Prescription  None  None         Oxygen Discharge (Final Oxygen Re-Evaluation): Oxygen Re-Evaluation - 01/29/17 1326      Program Oxygen Prescription   Program Oxygen Prescription  None      Home Oxygen   Home Oxygen Device  None    Sleep Oxygen Prescription  None    Home Exercise Oxygen Prescription  None    Home at Rest Exercise Oxygen Prescription  None       Initial Exercise Prescription: Initial Exercise Prescription - 05/18/17 1500      Date of Initial Exercise RX and Referring Provider   Date  05/18/17    Referring Provider  Martinique, Peter MD      NuStep   Level  2    Minutes  15    METs  2      Track   Laps  10    Minutes  15    METs  2.16      Prescription Details   Frequency (times per week)  3    Duration  Progress to 30 minutes of continuous aerobic without signs/symptoms of physical distress      Intensity   THRR 40-80% of Max Heartrate  61-122    Ratings of Perceived Exertion  11-15    Perceived Dyspnea  0-4      Progression   Progression  Continue to progress workloads to maintain intensity without signs/symptoms of physical distress.      Resistance Training   Training Prescription  Yes    Weight  3lbs    Reps  10-15       Perform Capillary Blood Glucose checks as needed.  Exercise Prescription Changes: Exercise Prescription Changes    Row Name 11/24/16 1500 11/26/16 1500 12/08/16 1500 12/10/16 1556 12/15/16 1500     Response to Exercise   Blood Pressure (Admit)  132/64  150/64  134/78  136/64  160/84   Blood Pressure (Exercise)  130/70  182/94  170/80  126/74  186/82   Blood Pressure (Exit)  126/68  124/72  122/60  120/66  128/72   Heart Rate (Admit)  76 bpm  81 bpm  80 bpm  79 bpm  80 bpm   Heart Rate (Exercise)  109 bpm  106 bpm  118 bpm  109 bpm  122 bpm   Heart Rate (Exit)  94 bpm  96 bpm  97 bpm  82 bpm  95 bpm   Oxygen Saturation (Admit)  96 %  96 %  96 %  97 %  97 %    Oxygen Saturation (Exercise)  94 %  95 %  93 %  93 %  87 %   Oxygen Saturation (Exit)  95 %  96 %  96 %  95 %  96 %   Rating of Perceived Exertion (Exercise)  13  13  13  12  13    Perceived Dyspnea (Exercise)  2  3  2  2  3    Duration  Continue with 30 min of aerobic exercise without signs/symptoms of physical distress.  Continue with 45 min of aerobic exercise without signs/symptoms of physical distress.  Continue with 45 min of aerobic exercise without signs/symptoms of physical distress.  Continue with 45 min of aerobic exercise without signs/symptoms of physical distress.  Continue with 45 min of aerobic exercise without signs/symptoms of physical distress.   Intensity  THRR unchanged  THRR unchanged  THRR unchanged  THRR unchanged  THRR unchanged     Progression   Progression  Continue to progress workloads to maintain intensity without signs/symptoms of physical distress.  Continue to progress workloads to maintain intensity without signs/symptoms of physical distress.  Continue to progress workloads to maintain intensity without signs/symptoms of physical distress.  Continue to progress workloads to maintain intensity without signs/symptoms of physical distress.  Continue to progress  workloads to maintain intensity without signs/symptoms of physical distress.     Resistance Training   Training Prescription  Yes  Yes  Yes  Yes  Yes   Weight  blue bands  blue bands  blue bands  blue bands  blue bands   Reps  10-15  10-15  10-15  10-15  10-15   Time  10 Minutes  10 Minutes  10 Minutes  10 Minutes  10 Minutes     Interval Training   Interval Training  No  No  No  No  No     NuStep   Level  7  -  7  7  7    Minutes  17  -  17  17  17    METs  2.7  -  3.4  3.4  3.4     Arm/Foot Ergometer   Level  -  -  -  7  -   Minutes  -  -  -  17  -   METs  -  -  -  3.1  -     Rower   Level  -  4  4  -  4   Minutes  -  17  17  -  17     Track   Laps  16  16  18  9  21    Minutes  17  17  17  17   17    Row Name 12/17/16 1606 12/22/16 1500 12/24/16 1500 12/31/16 1500 01/05/17 1608     Response to Exercise   Blood Pressure (Admit)  142/70  140/76  146/70  138/66  126/60   Blood Pressure (Exercise)  140/70  154/80  144/70  174/82  162/82   Blood Pressure (Exit)  118/70  122/62  110/60  128/74  134/68   Heart Rate (Admit)  95 bpm  82 bpm  84 bpm  83 bpm  80 bpm   Heart Rate (Exercise)  101 bpm  121 bpm  132 bpm  115 bpm  125 bpm   Heart Rate (Exit)  89 bpm  88 bpm  99 bpm  83 bpm  96 bpm   Oxygen Saturation (Admit)  96 %  96 %  96 %  95 %  96 %   Oxygen Saturation (Exercise)  96 %  93 %  91 %  89 %  90 %   Oxygen Saturation (Exit)  95 %  95 %  94 %  94 %  95 %   Rating of Perceived Exertion (Exercise)  12  12  12  12  13    Perceived Dyspnea (Exercise)  2  2  3  2  3    Duration  Continue with 45 min of aerobic exercise without signs/symptoms of physical distress.  Continue with 45 min of aerobic exercise without signs/symptoms of physical distress.  Continue with 45 min of aerobic exercise without signs/symptoms of physical distress.  Continue with 45 min of aerobic exercise without signs/symptoms of physical distress.  Continue with 45 min of aerobic exercise without signs/symptoms of physical distress.   Intensity  THRR unchanged  THRR unchanged  THRR unchanged  THRR unchanged  THRR unchanged     Progression   Progression  Continue to progress workloads to maintain intensity without signs/symptoms of physical distress.  Continue to progress workloads to maintain intensity without signs/symptoms of physical distress.  Continue to progress workloads to maintain intensity without signs/symptoms of physical  distress.  Continue to progress workloads to maintain intensity without signs/symptoms of physical distress.  Continue to progress workloads to maintain intensity without signs/symptoms of physical distress.     Resistance Training   Training Prescription  Yes  Yes  Yes  Yes  Yes   Weight   blue bands  blue bands  blue bands  blue bands  blue bands   Reps  10-15  10-15  10-15  10-15  10-15   Time  10 Minutes  10 Minutes  10 Minutes  10 Minutes  10 Minutes     Interval Training   Interval Training  No  No  No  No  No     NuStep   Level  7  7  7  7  7    Minutes  17  17  17  17  17    METs  4.4  5.1  3.3  3.7  3.8     Rower   Level  4  4  -  -  -   Minutes  17  17  -  -  -     Track   Laps  -  17  35  16  [place] Name 01/07/17 1603 01/12/17 1533 01/14/17 1500 01/19/17 1531 01/21/17 1521     Response to Exercise   Blood Pressure (Admit)  120/66  130/60  150/68  120/66  140/52   Blood Pressure (Exercise)  128/58  128/58  150/80  140/80  130/70   Blood Pressure (Exit)  118/68  118/68  120/78  122/72  124/64   Heart Rate (Admit)  83 bpm  83 bpm  79 bpm  76 bpm  77 bpm   Heart Rate (Exercise)  103 bpm  103 bpm  113 bpm  107 bpm  124 bpm   Heart Rate (Exit)  84 bpm  84 bpm  93 bpm  85 bpm  88 bpm   Oxygen Saturation (Admit)  96 %  96 %  96 %  96 %  95 %   Oxygen Saturation (Exercise)  94 %  91 %  93 %  93 %  90 %   Oxygen Saturation (Exit)  95 %  96 %  96 %  95 %  95 %   Rating of Perceived Exertion (Exercise)  12  12  12  12  13    Perceived Dyspnea (Exercise)  2  1  3  3  3    Duration  Continue with 45 min of aerobic exercise without signs/symptoms of physical distress.  Continue with 45 min of aerobic exercise without signs/symptoms of physical distress.  Continue with 45 min of aerobic exercise without signs/symptoms of physical distress.  Continue with 45 min of aerobic exercise without signs/symptoms of physical distress.  Continue with 45 min of aerobic exercise without signs/symptoms of physical distress.   Intensity  THRR unchanged  THRR unchanged  THRR unchanged  THRR unchanged  THRR unchanged     Progression   Progression  Continue to progress workloads to maintain intensity without signs/symptoms of physical distress.  Continue to  progress workloads to maintain intensity without signs/symptoms of physical distress.  Continue to progress workloads to maintain intensity without signs/symptoms of physical distress.  Continue to progress workloads to maintain intensity without signs/symptoms of physical distress.  Continue to progress workloads to maintain intensity without signs/symptoms of physical  distress.     Resistance Training   Training Prescription  Yes  Yes  Yes  Yes  Yes   Weight  blue bands  blue bands  blue bands  blue bands  blue bands   Reps  10-15  10-15  10-15  10-15  10-15   Time  10 Minutes  10 Minutes  10 Minutes  10 Minutes  10 Minutes     Interval Training   Interval Training  No  No  No  No  No     NuStep   Level  7  7  7   -  7   Minutes  17  17  17   -  17   METs  3.5  3.8  5.2  -  3.4     Rower   Level  -  -  21  6  -   Minutes  -  -  17  17  -     Track   Laps  22  40  -  18  35   Minutes  [place] Name 01/26/17 1618 01/28/17 1622           Response to Exercise   Blood Pressure (Admit)  120/74  138/64      Blood Pressure (Exercise)  132/72  154/76      Blood Pressure (Exit)  122/64  120/70      Heart Rate (Admit)  67 bpm  89 bpm      Heart Rate (Exercise)  114 bpm  134 bpm      Heart Rate (Exit)  90 bpm  91 bpm      Oxygen Saturation (Admit)  96 %  97 %      Oxygen Saturation (Exercise)  91 %  91 %      Oxygen Saturation (Exit)  94 %  94 %      Rating of Perceived Exertion (Exercise)  13  13      Perceived Dyspnea (Exercise)  3  2      Duration  Continue with 45 min of aerobic exercise without signs/symptoms of physical distress.  Continue with 45 min of aerobic exercise without signs/symptoms of physical distress.      Intensity  THRR unchanged  THRR unchanged        Progression   Progression  Continue to progress workloads to maintain intensity without signs/symptoms of physical distress.  Continue to progress workloads to maintain intensity without  signs/symptoms of physical distress.        Resistance Training   Training Prescription  Yes  Yes      Weight  blue bands  blue bands      Reps  10-15  10-15      Time  10 Minutes  10 Minutes        Interval Training   Interval Training  No  No        NuStep   Level  7  7      Minutes  17  17      METs  3.4  4.1        Rower   Level  4  -      Minutes  17  -        Track   Laps  19  21      Minutes  17  17  Exercise Comments:   Exercise Goals and Review: Exercise Goals    Row Name 05/18/17 1359             Exercise Goals   Increase Physical Activity  Yes       Intervention  Provide advice, education, support and counseling about physical activity/exercise needs.;Develop an individualized exercise prescription for aerobic and resistive training based on initial evaluation findings, risk stratification, comorbidities and participant's personal goals.       Expected Outcomes  Achievement of increased cardiorespiratory fitness and enhanced flexibility, muscular endurance and strength shown through measurements of functional capacity and personal statement of participant.       Increase Strength and Stamina  Yes       Intervention  Provide advice, education, support and counseling about physical activity/exercise needs.;Develop an individualized exercise prescription for aerobic and resistive training based on initial evaluation findings, risk stratification, comorbidities and participant's personal goals.       Expected Outcomes  Achievement of increased cardiorespiratory fitness and enhanced flexibility, muscular endurance and strength shown through measurements of functional capacity and personal statement of participant.       Able to understand and use rate of perceived exertion (RPE) scale  Yes       Intervention  Provide education and explanation on how to use RPE scale       Expected Outcomes  Short Term: Able to use RPE daily in rehab to express subjective  intensity level;Long Term:  Able to use RPE to guide intensity level when exercising independently       Knowledge and understanding of Target Heart Rate Range (THRR)  Yes       Intervention  Provide education and explanation of THRR including how the numbers were predicted and where they are located for reference       Expected Outcomes  Short Term: Able to state/look up THRR;Long Term: Able to use THRR to govern intensity when exercising independently;Short Term: Able to use daily as guideline for intensity in rehab       Able to check pulse independently  Yes       Intervention  Provide education and demonstration on how to check pulse in carotid and radial arteries.;Review the importance of being able to check your own pulse for safety during independent exercise       Expected Outcomes  Short Term: Able to explain why pulse checking is important during independent exercise;Long Term: Able to check pulse independently and accurately       Understanding of Exercise Prescription  Yes       Intervention  Provide education, explanation, and written materials on patient's individual exercise prescription       Expected Outcomes  Short Term: Able to explain program exercise prescription;Long Term: Able to explain home exercise prescription to exercise independently          Exercise Goals Re-Evaluation : Exercise Goals Re-Evaluation    Row Name 12/04/16 1341 01/05/17 0951 01/25/17 1412         Exercise Goal Re-Evaluation   Exercise Goals Review  Increase Strenth and Stamina;Increase Physical Activity  Increase Strength and Stamina;Increase Physical Activity;Able to understand and use Dyspnea scale;Able to understand and use rate of perceived exertion (RPE) scale;Knowledge and understanding of Target Heart Rate Range (THRR);Understanding of Exercise Prescription  Increase Strength and Stamina;Increase Physical Activity;Able to understand and use Dyspnea scale;Able to understand and use rate of  perceived exertion (RPE) scale;Knowledge and understanding of Target Heart Rate  Range (THRR);Understanding of Exercise Prescription     Comments  Patient is progressing well in program. Comes to each rehab session motivated to work hard. Struggles with shortness of breath. Home exercise completed. Will cont. to monitor and progress as able.   Patient is progressing well in program. Comes to each rehab session motivated to work hard. Struggles with shortness of breath. Walks up to 16 laps (200 feet each) in 15 minutes.  Home exercise completed. Will cont. to monitor and progress as able.   Patient is progressing well in program. Comes to each rehab session motivated to work hard. Struggles with shortness of breath. Walks up to 16 laps (200 feet each) in 15 minutes.  Home exercise completed. Will cont. to monitor and progress as able.      Expected Outcomes  Through exercising at rehab and at home, patient will increase physical strength and stamina and find ADL's easier to perform.   Through exercising at rehab and at home, patient will increase physical strength and stamina and find ADL's easier to perform.   Through exercising at rehab and at home, patient will increase physical strength and stamina and find ADL's easier to perform.          Discharge Exercise Prescription (Final Exercise Prescription Changes): Exercise Prescription Changes - 01/28/17 1622      Response to Exercise   Blood Pressure (Admit)  138/64    Blood Pressure (Exercise)  154/76    Blood Pressure (Exit)  120/70    Heart Rate (Admit)  89 bpm    Heart Rate (Exercise)  134 bpm    Heart Rate (Exit)  91 bpm    Oxygen Saturation (Admit)  97 %    Oxygen Saturation (Exercise)  91 %    Oxygen Saturation (Exit)  94 %    Rating of Perceived Exertion (Exercise)  13    Perceived Dyspnea (Exercise)  2    Duration  Continue with 45 min of aerobic exercise without signs/symptoms of physical distress.    Intensity  THRR unchanged       Progression   Progression  Continue to progress workloads to maintain intensity without signs/symptoms of physical distress.      Resistance Training   Training Prescription  Yes    Weight  blue bands    Reps  10-15    Time  10 Minutes      Interval Training   Interval Training  No      NuStep   Level  7    Minutes  17    METs  4.1      Track   Laps  21    Minutes  17       Nutrition:  Target Goals: Understanding of nutrition guidelines, daily intake of sodium <1555m, cholesterol <2064m calories 30% from fat and 7% or less from saturated fats, daily to have 5 or more servings of fruits and vegetables.  Biometrics: Pre Biometrics - 05/18/17 1531      Pre Biometrics   Height  5' 7"  (1.702 m)    Weight  225 lb 5 oz (102.2 kg)    Waist Circumference  46 inches    Hip Circumference  46.5 inches    Waist to Hip Ratio  0.99 %    BMI (Calculated)  35.28    Triceps Skinfold  15 mm    % Body Fat  33.4 %    Grip Strength  36 kg    Flexibility  13 in    Single Leg Stand  30 seconds      Post Biometrics - 02/02/17 1535       Post  Biometrics   Grip Strength  30 kg       Nutrition Therapy Plan and Nutrition Goals: Nutrition Therapy & Goals - 11/24/16 1505      Nutrition Therapy   Diet  Carb Modified, Therapeutic Lifestyle Changes      Personal Nutrition Goals   Nutrition Goal  Pt to be able to name foods that affect blood sugar.     Personal Goal #2  Pt to maintain his wt around 220 lb until pt is ready to focus on wt loss.     Personal Goal #3  Describe the benefit of including fruits, vegetables, whole grains, and low-fat dairy products in a healthy meal plan.    Personal Goal #4  Pt to increase frequency of food consumed to at least a small snack (e.g. 1/4 cup nuts, Greek yogurt) every 5 hours.      Intervention Plan   Intervention  Prescribe, educate and counsel regarding individualized specific dietary modifications aiming towards targeted core components such  as weight, hypertension, lipid management, diabetes, heart failure and other comorbidities.    Expected Outcomes  Short Term Goal: Understand basic principles of dietary content, such as calories, fat, sodium, cholesterol and nutrients.;Long Term Goal: Adherence to prescribed nutrition plan.       Nutrition Discharge: Nutrition Scores: Nutrition Assessments - 02/05/17 1112      Rate Your Plate Scores   Pre Score  51    Post Score  59       Nutrition Goals Re-Evaluation: Nutrition Goals Re-Evaluation    Row Name 02/05/17 1114             Goals   Current Weight  101 lb 1.6 oz (45.9 kg)       Nutrition Goal  Pt to be able to name foods that affect blood sugar.        Comment  Pt is making healthier food choices (including desserts). Pt has maintained his wt.          Personal Goal #2 Re-Evaluation   Personal Goal #2  Pt to maintain his wt around 220 lb until pt is ready to focus on wt loss.          Personal Goal #3 Re-Evaluation   Personal Goal #3  Describe the benefit of including fruits, vegetables, whole grains, and low-fat dairy products in a healthy meal plan.         Personal Goal #4 Re-Evaluation   Personal Goal #4  Pt to increase frequency of food consumed to at least a small snack (e.g. 1/4 cup nuts, Greek yogurt) every 5 hours.          Nutrition Goals Re-Evaluation: Nutrition Goals Re-Evaluation    [place] Name 02/05/17 1114             Goals   Current Weight  101 lb 1.6 oz (45.9 kg)       Nutrition Goal  Pt to be able to name foods that affect blood sugar.        Comment  Pt is making healthier food choices (including desserts). Pt has maintained his wt.          Personal Goal #2 Re-Evaluation   Personal Goal #2  Pt to maintain his wt around 220 lb until pt is ready to focus  on wt loss.          Personal Goal #3 Re-Evaluation   Personal Goal #3  Describe the benefit of including fruits, vegetables, whole grains, and low-fat dairy products in a healthy meal  plan.         Personal Goal #4 Re-Evaluation   Personal Goal #4  Pt to increase frequency of food consumed to at least a small snack (e.g. 1/4 cup nuts, Greek yogurt) every 5 hours.          Nutrition Goals Discharge (Final Nutrition Goals Re-Evaluation): Nutrition Goals Re-Evaluation - 02/05/17 1114      Goals   Current Weight  101 lb 1.6 oz (45.9 kg)    Nutrition Goal  Pt to be able to name foods that affect blood sugar.     Comment  Pt is making healthier food choices (including desserts). Pt has maintained his wt.       Personal Goal #2 Re-Evaluation   Personal Goal #2  Pt to maintain his wt around 220 lb until pt is ready to focus on wt loss.       Personal Goal #3 Re-Evaluation   Personal Goal #3  Describe the benefit of including fruits, vegetables, whole grains, and low-fat dairy products in a healthy meal plan.      Personal Goal #4 Re-Evaluation   Personal Goal #4  Pt to increase frequency of food consumed to at least a small snack (e.g. 1/4 cup nuts, Greek yogurt) every 5 hours.       Psychosocial: Target Goals: Acknowledge presence or absence of significant depression and/or stress, maximize coping skills, provide positive support system. Participant is able to verbalize types and ability to use techniques and skills needed for reducing stress and depression.  Initial Review & Psychosocial Screening: Initial Psych Review & Screening - 05/18/17 1408      Initial Review   Current issues with  None Identified    Source of Stress Concerns  None Identified      Family Dynamics   Good Support System?  Yes spouse    Comments  no psychoosocial needs identified, no interventions necessary       Barriers   Psychosocial barriers to participate in program  There are no identifiable barriers or psychosocial needs.      Screening Interventions   Interventions  Encouraged to exercise       Quality of Life Scores: Quality of Life - 05/18/17 1411      Quality of Life  Scores   Health/Function Pre  17.23 %    Socioeconomic Pre  25.44 %    Psych/Spiritual Pre  24 %    Family Pre  27.6 %    GLOBAL Pre  21.94 %       PHQ-9: Recent Review Flowsheet Data    Depression screen [facility] 2/9 02/02/2017 10/19/2016 07/29/2016 07/27/2016 07/28/2015   Decreased Interest 0 1 0 0 0   Down, Depressed, Hopeless 0 3 0 0 0   PHQ - 2 Score 0 4 0 0 0   Altered sleeping - 3 - - -   Tired, decreased energy - 3 - - -   Change in appetite - 3 - - -   Feeling bad or failure about yourself  - 0 - - -   Trouble concentrating - 0 - - -   Moving slowly or fidgety/restless - 0 - - -   Suicidal thoughts - 0 - - -  PHQ-9 Score - 13 - - -   Difficult doing work/chores - Not difficult at all - - -     Interpretation of Total Score  Total Score Depression Severity:  1-4 = Minimal depression, 5-9 = Mild depression, 10-14 = Moderate depression, 15-19 = Moderately severe depression, 20-27 = Severe depression   Psychosocial Evaluation and Intervention: Psychosocial Evaluation - 02/22/17 0857      Discharge Psychosocial Assessment & Intervention   Comments  no psychosocial barriers to exercise noted at discharge       Psychosocial Re-Evaluation: Psychosocial Re-Evaluation    [place] Name 12/08/16 1624 12/08/16 1625 01/06/17 2130 01/29/17 1328       Psychosocial Re-Evaluation   Current issues with  -  Current Stress Concerns  Current Stress Concerns  None Identified    Comments  patient is discouraged with his continue shortness of breath but is beginning to accept that he may need valve surgery inorder for it to improv  -  patient has accepted the need for valve surgery. has appointment with cardiologist september 5 to discuss options  -    Interventions  -  Encouraged to attend Pulmonary Rehabilitation for the exercise encouraged patient to re-evaluate need for valve surgery with cardiothoracic surgeon  Encouraged to attend Pulmonary Rehabilitation for the exercise  Encouraged to attend  Pulmonary Rehabilitation for the exercise    Continue Psychosocial Services   -  Follow up required by staff  Follow up required by staff  Follow up required by staff    Comments  -  if his shortness of breath is related to his valve then pulmonary rehab may not improve his shortness of breath as he anticipated.  if his shortness of breath is related to his valve then pulmonary rehab may not improve his shortness of breath as he anticipated.  -      Initial Review   Source of Stress Concerns  -  Unable to participate in former interests or hobbies;Unable to perform yard/household activities  Unable to participate in former interests or hobbies;Unable to perform yard/household activities  -       Psychosocial Discharge (Final Psychosocial Re-Evaluation): Psychosocial Re-Evaluation - 01/29/17 1328      Psychosocial Re-Evaluation   Current issues with  None Identified    Interventions  Encouraged to attend Pulmonary Rehabilitation for the exercise    Continue Psychosocial Services   Follow up required by staff       Vocational Rehabilitation: Provide vocational rehab assistance to qualifying candidates.   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 Participant will state understanding/return demonstration of topics presented.  Learning Barriers/Preferences: Learning Barriers/Preferences - 05/18/17 1355      Learning Barriers/Preferences   Learning Barriers  Sight    Learning Preferences  Audio;Computer/Internet;Pictoral;Skilled Demonstration;Verbal Instruction;Video;Written Material;Individual Instruction;Group Instruction       Education Topics: Count Your Pulse:  -Group instruction provided  by verbal instruction, demonstration, patient participation and written materials to support subject.  Instructors address importance of being able to find your pulse and how to count your pulse when at home without a heart monitor.  Patients get hands on experience counting their pulse with staff help and individually.   Heart Attack,  Angina, and Risk Factor Modification:  -Group instruction provided by verbal instruction, video, and written materials to support subject.  Instructors address signs and symptoms of angina and heart attacks.    Also discuss risk factors for heart disease and how to make changes to improve heart health risk factors.   Functional Fitness:  -Group instruction provided by verbal instruction, demonstration, patient participation, and written materials to support subject.  Instructors address safety measures for doing things around the house.  Discuss how to get up and down off the floor, how to pick things up properly, how to safely get out of a chair without assistance, and balance training.   Meditation and Mindfulness:  -Group instruction provided by verbal instruction, patient participation, and written materials to support subject.  Instructor addresses importance of mindfulness and meditation practice to help reduce stress and improve awareness.  Instructor also leads participants through a meditation exercise.    PULMONARY REHAB OTHER RESPIRATORY from 01/28/2017 in [place]  Date  12/10/16  Educator  Mable Fill [[place]]  Instruction Review Code  2- meets goals/outcomes      Stretching for Flexibility and Mobility:  -Group instruction provided by verbal instruction, patient participation, and written materials to support subject.  Instructors lead participants through series of stretches that are designed to increase flexibility thus improving mobility.  These stretches are additional exercise for major muscle groups that are  typically performed during regular warm up and cool down.   Hands Only CPR:  -Group verbal, video, and participation provides a basic overview of AHA guidelines for community CPR. Role-play of emergencies allow participants the opportunity to practice calling for help and chest compression technique with discussion of AED use.   Hypertension: -Group verbal and written instruction that provides a basic overview of hypertension including the most recent diagnostic guidelines, risk factor reduction with self-care instructions and medication management.    Nutrition I class: Heart Healthy Eating:  -Group instruction provided by PowerPoint slides, verbal discussion, and written materials to support subject matter. The instructor gives an explanation and review of the Therapeutic Lifestyle Changes diet recommendations, which includes a discussion on lipid goals, dietary fat, sodium, fiber, plant stanol/sterol esters, sugar, and the components of a well-balanced, healthy diet.   Nutrition II class: Lifestyle Skills:  -Group instruction provided by PowerPoint slides, verbal discussion, and written materials to support subject matter. The instructor gives an explanation and review of label reading, grocery shopping for heart health, heart healthy recipe modifications, and ways to make healthier choices when eating out.   Diabetes Question & Answer:  -Group instruction provided by PowerPoint slides, verbal discussion, and written materials to support subject matter. The instructor gives an explanation and review of diabetes co-morbidities, pre- and post-prandial blood glucose goals, pre-exercise blood glucose goals, signs, symptoms, and treatment of hypoglycemia and hyperglycemia, and foot care basics.   Diabetes Blitz:  -Group instruction provided by PowerPoint slides, verbal discussion, and written materials to support subject matter. The instructor gives an explanation and review of the physiology  behind type 1 and type 2 diabetes, diabetes medications and rational behind using different medications, pre- and post-prandial blood glucose recommendations and Hemoglobin A1c goals, diabetes diet, and exercise including blood glucose guidelines for exercising safely.    Portion Distortion:  -Group instruction provided by PowerPoint slides, verbal discussion, written materials, and food models to support subject matter. The instructor gives an explanation of serving size versus portion size, changes in portions sizes over the  last 20 years, and what consists of a serving from each food group.   Stress Management:  -Group instruction provided by verbal instruction, video, and written materials to support subject matter.  Instructors review role of stress in heart disease and how to cope with stress positively.     Exercising on Your Own:  -Group instruction provided by verbal instruction, power point, and written materials to support subject.  Instructors discuss benefits of exercise, components of exercise, frequency and intensity of exercise, and end points for exercise.  Also discuss use of nitroglycerin and activating EMS.  Review options of places to exercise outside of rehab.  Review guidelines for sex with heart disease.   Cardiac Drugs I:  -Group instruction provided by verbal instruction and written materials to support subject.  Instructor reviews cardiac drug classes: antiplatelets, anticoagulants, beta blockers, and statins.  Instructor discusses reasons, side effects, and lifestyle considerations for each drug class.   Cardiac Drugs II:  -Group instruction provided by verbal instruction and written materials to support subject.  Instructor reviews cardiac drug classes: angiotensin converting enzyme inhibitors (ACE-I), angiotensin II receptor blockers (ARBs), nitrates, and calcium channel blockers.  Instructor discusses reasons, side effects, and lifestyle considerations for each drug  class.   Anatomy and Physiology of the Circulatory System:  Group verbal and written instruction and models provide basic cardiac anatomy and physiology, with the coronary electrical and arterial systems. Review of: AMI, Angina, Valve disease, Heart Failure, Peripheral Artery Disease, Cardiac Arrhythmia, Pacemakers, and the ICD.   Other Education:  -Group or individual verbal, written, or video instructions that support the educational goals of the cardiac rehab program.   Knowledge Questionnaire Score: Knowledge Questionnaire Score - 05/18/17 1407      Knowledge Questionnaire Score   Pre Score  24/24       Core Components/Risk Factors/Patient Goals at Admission: Personal Goals and Risk Factors at Admission - 05/18/17 1403      Core Components/Risk Factors/Patient Goals on Admission    Weight Management  Obesity    Improve shortness of breath with ADL's  Yes    Intervention  Provide education, individualized exercise plan and daily activity instruction to help decrease symptoms of SOB with activities of daily living.    Expected Outcomes  Short Term: Achieves a reduction of symptoms when performing activities of daily living.    Develop more efficient breathing techniques such as purse lipped breathing and diaphragmatic breathing; and practicing self-pacing with activity  Yes    Intervention  Provide education, demonstration and support about specific breathing techniuqes utilized for more efficient breathing. Include techniques such as pursed lipped breathing, diaphragmatic breathing and self-pacing activity.    Expected Outcomes  Short Term: Participant will be able to demonstrate and use breathing techniques as needed throughout daily activities.    Diabetes  Yes    Intervention  Provide education about signs/symptoms and action to take for hypo/hyperglycemia.;Provide education about proper nutrition, including hydration, and aerobic/resistive exercise prescription along with  prescribed medications to achieve blood glucose in normal ranges: Fasting glucose 65-99 mg/dL    Expected Outcomes  Short Term: Participant verbalizes understanding of the signs/symptoms and immediate care of hyper/hypoglycemia, proper foot care and importance of medication, aerobic/resistive exercise and nutrition plan for blood glucose control.;Long Term: Attainment of HbA1C < 7%.    Heart Failure  Yes    Intervention  Provide a combined exercise and nutrition program that is supplemented with education, support and counseling about heart failure. Directed toward  relieving symptoms such as shortness of breath, decreased exercise tolerance, and extremity edema.    Expected Outcomes  Improve functional capacity of life;Short term: Attendance in program 2-3 days a week with increased exercise capacity. Reported lower sodium intake. Reported increased fruit and vegetable intake. Reports medication compliance.;Short term: Daily weights obtained and reported for increase. Utilizing diuretic protocols set by physician.;Long term: Adoption of self-care skills and reduction of barriers for early signs and symptoms recognition and intervention leading to self-care maintenance.    Lipids  Yes    Intervention  Provide education and support for participant on nutrition & aerobic/resistive exercise along with prescribed medications to achieve LDL <60m, HDL >45m    Expected Outcomes  Short Term: Participant states understanding of desired cholesterol values and is compliant with medications prescribed. Participant is following exercise prescription and nutrition guidelines.;Long Term: Cholesterol controlled with medications as prescribed, with individualized exercise RX and with personalized nutrition plan. Value goals: LDL < 7029mHDL > 40 mg.       Core Components/Risk Factors/Patient Goals Review:  Goals and Risk Factor Review    Row Name 12/08/16 1617 01/06/17 2129 01/29/17 1326 02/22/17 0854       Core  Components/Risk Factors/Patient Goals Review   Personal Goals Review  Weight Management/Obesity;Improve shortness of breath with ADL's;Increase knowledge of respiratory medications and ability to use respiratory devices properly.;Develop more efficient breathing techniques such as purse lipped breathing and diaphragmatic breathing and practicing self-pacing with activity.  Weight Management/Obesity;Improve shortness of breath with ADL's;Increase knowledge of respiratory medications and ability to use respiratory devices properly.;Develop more efficient breathing techniques such as purse lipped breathing and diaphragmatic breathing and practicing self-pacing with activity.  Weight Management/Obesity;Improve shortness of breath with ADL's;Increase knowledge of respiratory medications and ability to use respiratory devices properly.;Develop more efficient breathing techniques such as purse lipped breathing and diaphragmatic breathing and practicing self-pacing with activity.  -    Review  patient has progressed well over the past 30 days in pulmonary rehab. he has tolerated workload increases although is limited by his shortness of breath. This may not be related to any pulmonary illness but to his need for a valve replacement. he is utilizing PLB when appropriate. He is beginning to ask question regarding the effects of a round abdomen on breathing and has realized that if he can loose some weight his breathing may improve somewhat.   patient has progressed well over the past 30 days in pulmonary rehab. he has tolerated workload increases although is limited by his shortness of breath. This may not be related to any pulmonary illness but to his need for a valve replacement. he is utilizing PLB when appropriate. He is beginning to ask question regarding the effects of a round abdomen on breathing and has realized that if he can loose some weight his breathing may improve somewhat.   patient continues to progress well  in pulmonary rehab. He is able to walk 21 laps in 15 min which is almost a mile. His shortness of breath is not improving and may be getting worse with exertions however this is valve related and unfortunately will not improve with rehab. his cardiologist gave him clearence to continue to push himself at rehab.  patient met all pulmonary rehab goals at discharge except shortness of breath. His shortness of breath is assumend to be related to a heart valve and not a lung problem.     Expected Outcomes  expect to see more progression of goals in  the next 30 days  expect to see more progression of goals in the next 30 days  expect to see more progression of goals in the next 30 days  -       Core Components/Risk Factors/Patient Goals at Discharge (Final Review):  Goals and Risk Factor Review - 02/22/17 0854      Core Components/Risk Factors/Patient Goals Review   Review  patient met all pulmonary rehab goals at discharge except shortness of breath. His shortness of breath is assumend to be related to a heart valve and not a lung problem.        ITP Comments: ITP Comments    Row Name 05/18/17 1352           ITP Comments  Medical Director- Dr. Fransico Him, MD.          Comments: Patient attended orientation from  1343 to 1511 to review rules and guidelines for program. Completed 6 minute walk test, Intitial ITP, and exercise prescription.  VSS. Telemetry-sinus rhythm.  Asymptomatic. Andi Hence, RN, BSN Cardiac Pulmonary Rehab 05/18/17  4:02 PM

## 2017-05-18 NOTE — Telephone Encounter (Signed)
Spoke with Ivin Booty, she states pt is requesting a new CPAP machine. Can we place an order for a new CPAP. The pressures have to be included because they did not originally start pt on CPAP. RB I could not find a order with his pressures. Please advise. I also tried to get a download from [employer], no success.   [facility]  Fax order to [phone]

## 2017-05-18 NOTE — Telephone Encounter (Signed)
Anderson Malta with [facility] is calling back [phone]  If Anderson Malta isn't  there you can talk to [place].

## 2017-05-19 NOTE — Progress Notes (Signed)
James [name] 67 y.o. male DOB: November 05, 1950 MRN: [id number]      Nutrition Note  1. S/P CABG x 1   2. S/P MVR (mitral valve repair)    Past Medical History:  Diagnosis Date  . Adenocarcinoma of right lung, stage 1 ([place]) 09/06/2016  . Anxiety   . Arthritis    "knees, hips, back" (10/19/2012)  . Chronic diastolic congestive heart failure ([place])   . Chronic lower back pain   . Colon polyps    10/27/2002, repeat letter 09/17/2007  . Coronary artery disease   . Coronary artery disease involving native coronary artery of native heart without angina pectoris   . Depressive disorder, not elsewhere classified    no meds  . Diabetes mellitus without complication ([place])    diet controlled- no med  (while in hosp 4/18 -elevated cbg  . Dyspnea   . Fasting hyperglycemia   . GERD (gastroesophageal reflux disease)   . Heart murmur   . Hemoptysis    abnormal CT Chest 01/29/10 - ? new GG changes RUL > not viz on plain cxr 02/26/2010  . Hypertension   . Mitral regurgitation    severe MR 08/2016  . MPN (myeloproliferative neoplasm) ([place])    1st detected 06/04/1998  . Obesity   . OSA on CPAP    last sleep study 10 years ago  . Other and unspecified hyperlipidemia   . Peripheral vascular disease ([place]) 08/2016   after lung surgey small clots in lungs,after hip dvt-5/16  . Pneumonia    4/18  . Positive PPD 1965   "non reactive in 2012" (10/19/2012)  . Routine general medical examination at a health care facility   . S/P CABG x 1 03/24/2017   LIMA to LAD  . S/P mitral valve repair 03/24/2017   Complex valvuloplasty including artificial Gore-tex neochord placement x6 and 28 mm Sorin Annuloflex posterior annuloplasty band  . Special screening for malignant neoplasm of prostate   . Spinal stenosis, unspecified region other than cervical   . Wrist pain, left    Meds reviewed.  HT: Ht Readings from Last 1 Encounters:  05/18/17 5\' 7"  (1.702 m)    WT: Wt Readings from Last 3 Encounters:  05/18/17 225  lb 5 oz (102.2 kg)  05/17/17 224 lb (101.6 kg)  04/14/17 224 lb 6.4 oz (101.8 kg)     BMI 35.3   Current tobacco use? No   Labs:  Lipid Panel     Component Value Date/Time   CHOL 245 (H) 06/29/2016 1153   TRIG 198.0 (H) 06/29/2016 1153   TRIG 108 05/20/2006 0825   HDL 35.90 (L) 06/29/2016 1153   CHOLHDL 7 06/29/2016 1153   VLDL 39.6 06/29/2016 1153   LDLCALC 170 (H) 06/29/2016 1153   LDLDIRECT 177.0 07/24/2015 1052    Lab Results  Component Value Date   HGBA1C 6.5 (H) 03/22/2017   CBG (last 3)  No results for input(s): GLUCAP in the last 72 hours.  Nutrition Note Spoke with pt and pt's significant other, James [name]. Nutrition plan and goals reviewed with pt. Pt is following a Heart Healthy diet. Pt wants to lose wt. Wt loss tips reviewed. Pt is diabetic according to EMR. Pt reports he has never been told he was diabetic, "only pre-diabetic." Last A1c indicates blood glucose well-controlled with diet alone. Pt does not check CBG's at home. Diabetes/pre-diabetes discussed. Pt encouraged to discuss DM with PCP. Pt expressed understanding of the information reviewed. Pt aware of nutrition  education classes offered and plans on attending nutrition classes.  Nutrition Diagnosis ? Food-and nutrition-related knowledge deficit related to lack of exposure to information as related to diagnosis of: ? CVD ? DM ?  ? Obesity related to excessive energy intake as evidenced by a BMI of 33.1  Nutrition Intervention ? Pt's individual nutrition plan and goals reviewed with pt. ? Pt given handouts for: ? Nutrition I class ? Nutrition II class   Nutrition Goal(s):  ? Pt to identify food quantities necessary to achieve weight loss of 6-14 lb  at graduation from cardiac rehab. Goal wt of 210 lb desired.  ? Pt able to name foods that affect blood glucose   Plan:  Pt to attend nutrition classes ? Nutrition I ? Nutrition II ? Portion Distortion ? Diabetes Blitz ? Diabetes Q & A Will provide  client-centered nutrition education as part of interdisciplinary care.   Monitor and evaluate progress toward nutrition goal with team.  James [name], M.Ed, RD, LDN, CDE 05/19/2017 9:04 AM

## 2017-05-20 NOTE — Telephone Encounter (Signed)
Thank you :)

## 2017-05-20 NOTE — Telephone Encounter (Signed)
Pt has an appt to pick up his new cpap machine today at 12:00, was told by [facility] that if they did not receive cpap pressure numbers by our office by 10:00 they would have to cancel appt.  Called [facility], states that this a new patient for them and that they do not have previous pressure for pt.  Noted that pt was a previous [facility] pt.  Called [facility], previous pressure was set 4-20.   Called back to [facility] to make aware.  Order placed.  Pt aware.  Nothing further needed.

## 2017-05-20 NOTE — Telephone Encounter (Signed)
New Prescription from [name] for a new machine they need a new script on how to set the machine up automatic machine. They need to know the pressure.  He need this by 10am   [facility] fax [phone]  He said to talk to Ronnald Shedden [phone] Pt really needs this

## 2017-05-24 ENCOUNTER — Encounter (HOSPITAL_COMMUNITY): Payer: Medicare Other

## 2017-05-24 ENCOUNTER — Encounter (HOSPITAL_COMMUNITY): Payer: Self-pay

## 2017-05-24 ENCOUNTER — Encounter (HOSPITAL_COMMUNITY)
Admission: RE | Admit: 2017-05-24 | Discharge: 2017-05-24 | Disposition: A | Discharge status: Home / Self Care | Payer: Medicare Other | Source: Ambulatory Visit | Attending: Cardiology | Admitting: Cardiology

## 2017-05-24 DIAGNOSIS — Z951 Presence of aortocoronary bypass graft: Secondary | ICD-10-CM

## 2017-05-24 DIAGNOSIS — Z9889 Other specified postprocedural states: Secondary | ICD-10-CM

## 2017-05-24 DIAGNOSIS — C3491 Malignant neoplasm of unspecified part of right bronchus or lung: Secondary | ICD-10-CM | POA: Diagnosis not present

## 2017-05-24 NOTE — Progress Notes (Signed)
Daily Session Note  Patient Details  Name: James [name] MRN: [id number] Date of Birth: 07-31-1950 Referring Provider:     CARDIAC REHAB PHASE II ORIENTATION from 05/18/2017 in [place]  Referring Provider  Martinique, Peter MD      Encounter Date: 05/24/2017  Check In: Session Check In - 05/24/17 1315      Check-In   Location  [facility]    Staff Present  Andi Hence, RN, BSN;Amber Fair, MS, ACSM RCEP, Exercise Physiologist;Portia Rollene Rotunda, RN, Mosie Epstein, MS,ACSM CEP, Exercise Physiologist    Supervising physician immediately available to respond to emergencies  [facility] immediately available    Physician(s)  Dr. Horris Latino    Medication changes reported      No    Fall or balance concerns reported     No    Tobacco Cessation  No Change    Warm-up and Cool-down  Performed as group-led instruction    Resistance Training Performed  Yes    VAD Patient?  No      Pain Assessment   Currently in Pain?  No/denies    Multiple Pain Sites  No       Capillary Blood Glucose: No results found for this or any previous visit (from the past 24 hour(s)).    Social History   Tobacco Use  Smoking Status Never Smoker  Smokeless Tobacco Never Used    Goals Met:  Exercise tolerated well  Goals Unmet:  Not Applicable  Comments: Pt started cardiac rehab today.  Pt tolerated light exercise without difficulty. VSS, telemetry-sinus rhythm,  asymptomatic.  Medication list reconciled. Pt denies barriers to medicaiton compliance.  PSYCHOSOCIAL ASSESSMENT:  PHQ-1.  Pt admits to health related anxiety and depression.  Pt encouraged to participate in CR exercise and education opportunities to improve overall outlook. Pt also encouraged participate in spiritual care counselor as needed.    Pt enjoys spending time with his wife.   Pt personal goals are to decrease dyspnea and increase strength/stamina.   Pt oriented to exercise equipment and  routine.    Understanding verbalized.   Dr. Fransico Him is Medical Director for Cardiac Rehab at [facility].

## 2017-05-26 ENCOUNTER — Encounter (HOSPITAL_COMMUNITY): Payer: Medicare Other

## 2017-05-26 ENCOUNTER — Encounter (HOSPITAL_COMMUNITY)
Admission: RE | Admit: 2017-05-26 | Discharge: 2017-05-26 | Disposition: A | Discharge status: Home / Self Care | Payer: Medicare Other | Source: Ambulatory Visit | Attending: Cardiology | Admitting: Cardiology

## 2017-05-26 DIAGNOSIS — C3491 Malignant neoplasm of unspecified part of right bronchus or lung: Secondary | ICD-10-CM | POA: Diagnosis not present

## 2017-05-26 DIAGNOSIS — Z9889 Other specified postprocedural states: Secondary | ICD-10-CM

## 2017-05-26 DIAGNOSIS — Z951 Presence of aortocoronary bypass graft: Secondary | ICD-10-CM

## 2017-05-27 ENCOUNTER — Other Ambulatory Visit: Payer: Self-pay | Admitting: Thoracic Surgery (Cardiothoracic Vascular Surgery)

## 2017-05-27 DIAGNOSIS — R600 Localized edema: Secondary | ICD-10-CM

## 2017-05-27 LAB — HEPATIC FUNCTION PANEL
ALT: 36 IU/L (ref 0–44)
AST: 33 IU/L (ref 0–40)
Albumin: 4.4 g/dL (ref 3.6–4.8)
Alkaline Phosphatase: 73 IU/L (ref 39–117)
Bilirubin Total: 0.4 mg/dL (ref 0.0–1.2)
Bilirubin, Direct: 0.11 mg/dL (ref 0.00–0.40)
Total Protein: 6.7 g/dL (ref 6.0–8.5)

## 2017-05-27 LAB — LIPID PANEL
Chol/HDL Ratio: 4.3 ratio (ref 0.0–5.0)
Cholesterol, Total: 177 mg/dL (ref 100–199)
HDL: 41 mg/dL (ref >39)
LDL Calculated: 110 mg/dL — ABNORMAL HIGH (ref 0–99)
Triglycerides: 128 mg/dL (ref 0–149)
VLDL Cholesterol Cal: 26 mg/dL (ref 5–40)

## 2017-05-28 ENCOUNTER — Encounter (HOSPITAL_COMMUNITY): Payer: Medicare Other

## 2017-05-28 ENCOUNTER — Encounter (HOSPITAL_COMMUNITY)
Admission: RE | Admit: 2017-05-28 | Discharge: 2017-05-28 | Disposition: A | Discharge status: Home / Self Care | Payer: Medicare Other | Source: Ambulatory Visit | Attending: Cardiology | Admitting: Cardiology

## 2017-05-28 DIAGNOSIS — C3491 Malignant neoplasm of unspecified part of right bronchus or lung: Secondary | ICD-10-CM | POA: Diagnosis not present

## 2017-05-28 NOTE — Telephone Encounter (Signed)
Pateint completed labs yesterday per Wife

## 2017-05-30 NOTE — Progress Notes (Signed)
Cardiology Office Note    Date:  05/31/2017   ID:  James [name], DOB May 21, 1950, MRN [id number]  PCP:  James Lima, MD  Cardiologist:  [name]   Chief Complaint  Patient presents with  . Mitral Valve Prolapse  . Coronary Artery Disease    History of Present Illness:  James [name] is a 67 y.o. male with PMH of DM II, HLD, and severe MR. he was originally noted to have a loud murmur by his primary care physician.  Initial echocardiogram suggested mild MR, however it also suggested partially flail leaflet.  MR was thought to be underestimated.  TEE showed a flail P3 segment with ruptured cord and a severe MR.  Cardiac catheterization performed on 08/11/2016 showed 65% mid to distal LAD lesion, FFR borderline at 0.81, EF 65%.  Normal cardiac output and RV/LV filling pressure.  He had a PET scan on 08/17/2016 that showed a hypermetabolic solid 2.6 cm anterior right upper lobe pulmonary nodule compatible with prior bronchogenic, no hypermetabolic thoracic lymphadenopathy or distal metastatic disease.  There were nonspecific and mild asymmetric hypermetabolism in the left pontine tonsil and the left tongue base region.  CT surgery recommended lobectomy for his lung mass with possible CABG with [name] to LAD and MV repair.  He underwent right upper lobe lobectomy in April.  Pathology positive for adenocarcinoma stage Ia.  Serial CT scan was recommended by oncology.  Post procedure, he became very short of breath.  CTA was positive for PE, he was started on Xarelto.  Repeat echocardiogram obtained on 02/15/2017 showed EF 60-65%, persistent MR.  He eventually underwent mitral valve repair with Sorin Carbomedics Annuloglex size 28, [name] to LAD by Dr. Roxy Manns. He was DC on coumadin instead of Xarelto.   Patient presents today for follow up. He just started Cardiac Rehab this past week. He is still SOB with exertion.   He denies any chest pain. Notes the upper sternal incision is a little tender and sticks  out. He has not taken lasix in 3 weeks and weight has been stable. No bleeding problems.     He does have diminished breath sounds in the right base, previous CXR revealed elevated right hemidiaphragm as the cause.  Past Medical History:  Diagnosis Date  . Adenocarcinoma of right lung, stage 1 ([place]) 09/06/2016  . Anxiety   . Arthritis    "knees, hips, back" (10/19/2012)  . Chronic diastolic congestive heart failure ([place])   . Chronic lower back pain   . Colon polyps    10/27/2002, repeat letter 09/17/2007  . Coronary artery disease   . Coronary artery disease involving native coronary artery of native heart without angina pectoris   . Depressive disorder, not elsewhere classified    no meds  . Diabetes mellitus without complication ([place])    diet controlled- no med  (while in hosp 4/18 -elevated cbg  . Dyspnea   . Fasting hyperglycemia   . GERD (gastroesophageal reflux disease)   . Heart murmur   . Hemoptysis    abnormal CT Chest 01/29/10 - ? new GG changes RUL > not viz on plain cxr 02/26/2010  . Hypertension   . Mitral regurgitation    severe MR 08/2016  . MPN (myeloproliferative neoplasm) ([place])    1st detected 06/04/1998  . Obesity   . OSA on CPAP    last sleep study 10 years ago  . Other and unspecified hyperlipidemia   . Peripheral vascular disease ([place]) 08/2016  after lung surgey small clots in lungs,after hip dvt-5/16  . Pneumonia    4/18  . Positive PPD 1965   "non reactive in 2012" (10/19/2012)  . Routine general medical examination at a health care facility   . S/P CABG x 1 03/24/2017   [name] to LAD  . S/P mitral valve repair 03/24/2017   Complex valvuloplasty including artificial Gore-tex neochord placement x6 and 28 mm Sorin Annuloflex posterior annuloplasty band  . Special screening for malignant neoplasm of prostate   . Spinal stenosis, unspecified region other than cervical   . Wrist pain, left     Past Surgical History:  Procedure Laterality Date  . ANTERIOR  CRUCIATE LIGAMENT REPAIR Left 1967  . CARDIAC CATHETERIZATION  2000  . CHEST TUBE INSERTION Right 10/19/2012   post bronch  . COLONOSCOPY W/ POLYPECTOMY    . CORONARY ARTERY BYPASS GRAFT N/A 03/24/2017   Procedure: CORONARY ARTERY BYPASS GRAFTING (CABG)x1 using left internal mammary artery, [name]-LAD;  Surgeon: Rexene Alberts, MD;  Location: [facility];  Service: Open Heart Surgery;  Laterality: N/A;  . FLEXIBLE BRONCHOSCOPY  10/19/2012   Flexible video fiberoptic bronchoscopy with electromagnetic navigation and biopsies.  . INTRAVASCULAR PRESSURE WIRE/FFR STUDY N/A 08/11/2016   Procedure: Intravascular Pressure Wire/FFR Study;  Surgeon: Kirat Mezquita M Martinique, MD;  Location: [facility];  Service: Cardiovascular;  Laterality: N/A;  . LOBECTOMY Right 09/02/2016   Procedure: RIGHT UPPER LOBECTOMY;  Surgeon: Melrose Nakayama, MD;  Location: [facility];  Service: Thoracic;  Laterality: Right;  . LYMPH NODE DISSECTION Right 09/02/2016   Procedure: LYMPH NODE DISSECTION, RIGHT LUNG;  Surgeon: Melrose Nakayama, MD;  Location: [facility];  Service: Thoracic;  Laterality: Right;  . MITRAL VALVE REPAIR N/A 03/24/2017   Procedure: MITRAL VALVE REPAIR (MVR) with Sorin Carbomedics Annuloflex size 28;  Surgeon: Rexene Alberts, MD;  Location: [facility];  Service: Open Heart Surgery;  Laterality: N/A;  . RIGHT/LEFT HEART CATH AND CORONARY ANGIOGRAPHY N/A 08/11/2016   Procedure: Right/Left Heart Cath and Coronary Angiography;  Surgeon: Zanetta Dehaan M Martinique, MD;  Location: [facility];  Service: Cardiovascular;  Laterality: N/A;  . TEE WITHOUT CARDIOVERSION N/A 07/17/2016   Procedure: TRANSESOPHAGEAL ECHOCARDIOGRAM (TEE);  Surgeon: [name], MD;  Location: [facility];  Service: Cardiovascular;  Laterality: N/A;  . TEE WITHOUT CARDIOVERSION N/A 03/24/2017   Procedure: TRANSESOPHAGEAL ECHOCARDIOGRAM (TEE);  Surgeon: Rexene Alberts, MD;  Location: [facility];  Service: Open Heart Surgery;  Laterality: N/A;  . TONSILLECTOMY   1950's  . TOTAL HIP ARTHROPLASTY Left 10/05/2014   dr [name]  . TOTAL HIP ARTHROPLASTY Left 10/05/2014   Procedure: LEFT TOTAL HIP ARTHROPLASTY ANTERIOR APPROACH;  Surgeon: [name], MD;  Location: [facility];  Service: Orthopedics;  Laterality: Left;  Marland Kitchen VIDEO ASSISTED THORACOSCOPY (VATS)/WEDGE RESECTION Right 09/02/2016   Procedure: VIDEO ASSISTED THORACOSCOPY (VATS)/RIGHT UPPER LOBE WEDGE RESECTION;  Surgeon: Melrose Nakayama, MD;  Location: [place];  Service: Thoracic;  Laterality: Right;  Marland Kitchen VIDEO BRONCHOSCOPY WITH ENDOBRONCHIAL NAVIGATION N/A 10/19/2012   Procedure: VIDEO BRONCHOSCOPY WITH ENDOBRONCHIAL NAVIGATION;  Surgeon: [name], MD;  Location: [facility];  Service: Thoracic;  Laterality: N/A;  . WRIST RECONSTRUCTION Left 12/2009   'proximal row carpectomy" [name]    Current Medications: Outpatient Medications Prior to Visit  Medication Sig Dispense Refill  . acetaminophen (TYLENOL) 325 MG tablet Take 650 mg by mouth every 6 (six) hours as needed for moderate pain or headache.    Marland Kitchen  aspirin EC 81 MG tablet Take 1 tablet (81 mg total) by mouth daily. 90 tablet 3  . atorvastatin (LIPITOR) 20 MG tablet Take 1 tablet (20 mg total) by mouth daily. 90 tablet 3  . furosemide (LASIX) 40 MG tablet Take 1 tablet (40 mg total) by mouth daily as needed. 90 tablet 0  . metoprolol tartrate (LOPRESSOR) 25 MG tablet Take 0.5 tablets (12.5 mg total) 2 (two) times daily by mouth. 30 tablet 3  . Omega-3 Fatty Acids (FISH OIL) 500 MG CAPS Take 500 mg by mouth daily.     . potassium chloride SA (K-DUR,KLOR-CON) 20 MEQ tablet Take 1 tablet (20 mEq total) by mouth daily as needed. With lasix 90 tablet 0  . psyllium (METAMUCIL) 58.6 % packet Take 1 packet by mouth daily.    . sodium chloride (OCEAN) 0.65 % SOLN nasal spray Place 2 sprays into both nostrils at bedtime.    Bethann Humble Sulfate (ALLERGY RELIEF EYE DROPS OP) Apply 2 drops to eye as needed (for allergies).     . traMADol (ULTRAM) 50 MG  tablet Take 1 tablet (50 mg total) by mouth every 6 (six) hours as needed (may take one or two tablets every six hrs prn). 30 tablet 0  . Undecylenic Ac-Zn Undecylenate (FUNGI-NAIL TOE & FOOT EX) Apply 1 application topically daily as needed (for nail).     . warfarin (COUMADIN) 5 MG tablet Take 1.5 to 2 tablets by mouth daily as directed by coumadin clinic 50 tablet 3   No facility-administered medications prior to visit.      Allergies:   Symbicort [budesonide-formoterol fumarate] and Amoxicillin   Social History   Socioeconomic History  . Marital status: Married    Spouse name: None  . Number of children: 3  . Years of education: None  . Highest education level: None  Social Needs  . Financial resource strain: None  . Food insecurity - worry: None  . Food insecurity - inability: None  . Transportation needs - medical: None  . Transportation needs - non-medical: None  Occupational History  . Occupation: Retired, Financial risk analyst  . Occupation: Retired, Real estate    Comment: Slow  Tobacco Use  . Smoking status: Never Smoker  . Smokeless tobacco: Never Used  Substance and Sexual Activity  . Alcohol use: Yes    Alcohol/week: 0.6 oz    Types: 1 Cans of beer per week    Comment: none since 3 weeks  . Drug use: No  . Sexual activity: No    Partners: Female  Other Topics Concern  . None  Social History Narrative   HSG, [employer]. Married '73.  2 sons - '74,   '76;  1 daughter -  '77  6 grandchildren.   Work - IT consultant now retired. ACP - not fully discussed.                     Family History:  The patient's family history includes Heart attack in his father; Heart disease in his father and paternal uncle; Heart failure in his mother.   ROS:   Please see the history of present illness.    ROS All other systems reviewed and are negative.   PHYSICAL EXAM:   VS:  BP 100/72   Pulse 78   Ht 5\' 9"  (1.753 m)   Wt 224 lb 4 oz (101.7  kg)   BMI 33.12 kg/m    GEN: Well  nourished, well developed, in no acute distress  HEENT: normal  Neck: no JVD, carotid bruits, or masses Cardiac: RRR; there is a gr 2-5/3 systolic murmur heart best in RUSB and the precordium. No  rubs, or gallops,no edema  Respiratory:  clear to auscultation bilaterally, normal work of breathing + decreased breath sounds in the right base GI: soft, nontender, nondistended, + BS MS: no deformity or atrophy  Skin: warm and dry, no rash Neuro:  Alert and Oriented x 3, Strength and sensation are intact Psych: euthymic mood, full affect  Wt Readings from Last 3 Encounters:  05/31/17 224 lb 4 oz (101.7 kg)  05/18/17 225 lb 5 oz (102.2 kg)  05/17/17 224 lb (101.6 kg)      Studies/Labs Reviewed:   EKG:  EKG is not ordered today.   Recent Labs: 07/27/2016: TSH 2.14 02/08/2017: Brain Natriuretic Peptide 4 03/25/2017: Magnesium 2.3 03/29/2017: BUN 13; Creatinine, Ser 1.01; Hemoglobin 10.1; Platelets 179; Potassium 4.3; Sodium 137 05/27/2017: ALT 36   Lipid Panel    Component Value Date/Time   CHOL 177 05/27/2017 0807   TRIG 128 05/27/2017 0807   TRIG 108 05/20/2006 0825   HDL 41 05/27/2017 0807   CHOLHDL 4.3 05/27/2017 0807   CHOLHDL 7 06/29/2016 1153   VLDL 39.6 06/29/2016 1153   LDLCALC 110 (H) 05/27/2017 0807   LDLDIRECT 177.0 07/24/2015 1052    Additional studies/ records that were reviewed today include:    Cath 08/11/2016 Conclusion     Prox LAD to Mid LAD lesion, 30 %stenosed.  Mid LAD to Dist LAD lesion, 65 %stenosed.  Prox RCA to Mid RCA lesion, 15 %stenosed.  The left ventricular systolic function is normal.  LV end diastolic pressure is normal.  The left ventricular ejection fraction is 55-65% by visual estimate.  LV end diastolic pressure is normal.   1. Borderline single vessel obstructive CAD involving the mid LAD. FFR 0.81 2. Normal LV function EF 65% 3. Normal right heart and LV filling pressures 4. Normal  Cardiac output  Plan: will refer to CT surgery for consideration of MV repair. The stenosis in the LAD will be discussed. He is asymptomatic and I would favor treating it medically. If he develops symptoms in the future it could be treated with PCI.        Echo 02/15/2017 LV EF: 60% -   65%  Study Conclusions  - Left ventricle: The cavity size was normal. Wall thickness was   normal. Systolic function was normal. The estimated ejection   fraction was in the range of 60% to 65%. - Aortic valve: AV is thickened, calcified with minimally   restricted motion. - Mitral valve: Prolapse fo the posterior mitral leaflet. MR is at   least moderate in intensity Calcified annulus. Mildly thickened   leaflets . - Left atrium: The atrium was mildly dilated.    CABG with MVR 03/24/2017 Preoperative Diagnosis:        Severe Mitral Regurgitation  Single-vessel Coronary Artery Disease  Postoperative Diagnosis:    Same  Procedure:        Mitral Valve Repair             Complex valvuloplasty including artificial Gore-tex neochord placement x6             Sorin Carbomedics Annuloflex posterior annuloplasty band (size 47mm, catalog #[id number], serial #[id number])   Coronary Artery Bypass Grafting x 1              Left  Internal Mammary Artery to Distal Left Anterior Descending Coronary Artery     ASSESSMENT:    1. S/P mitral valve repair   2. S/P CABG x 1   3. Chronic diastolic heart failure ([place])   4. Hyperlipidemia with target LDL less than 130   5. Other pulmonary embolism without acute cor pulmonale, unspecified chronicity ([facility])      PLAN:  In order of problems listed above:  1. Severe MR s/p mitral valve annuloplasty:  He still has a murmur on exam but I suspect this may be more related to AV sclerosis. Will update an Echo now.  Continue with Cardiac Rehab to improve conditioning and breathing. I will follow up in 3 months.   2. Hyperlipidemia: Patient is reluctant to  increase his statin. LDL now 107. Wants to see how he can do with his diet. Will repeat lipids in 3 months. If still not at goal may need to increase lipitor or add Zetia.    3. Chronic diastolic heart failure: weight stable and no edema. On prn lasix which he has not needed.   4. S/p CABG x 1: [name] to LAD  5. DM 2: Managed by primary care provider  6. History of PE on Coumadin June 2018: he has completed 6 months of anticoagulation for provoked PE. He can stop Coumadin now. Continue ASA  7. Lung CA s/p resection.    Medication Adjustments/Labs and Tests Ordered: Current medicines are reviewed at length with the patient today.  Concerns regarding medicines are outlined above.  Medication changes, Labs and Tests ordered today are listed in the Patient Instructions below. Patient Instructions  Stop taking coumadin  Continue your other therapy  We will schedule you for an Echocardiogram  I will see you in 3 months.    Signed, Cookie Pore Martinique, MD  05/31/2017 2:40 PM    [facility] [address], [place], [place]  [address] Phone: [phone]; Fax: [phone]

## 2017-05-31 ENCOUNTER — Encounter: Payer: Self-pay | Admitting: Cardiology

## 2017-05-31 ENCOUNTER — Ambulatory Visit (INDEPENDENT_AMBULATORY_CARE_PROVIDER_SITE_OTHER): Payer: Self-pay | Admitting: Pharmacist Clinician (PhC)/ Clinical Pharmacy Specialist

## 2017-05-31 ENCOUNTER — Encounter (HOSPITAL_COMMUNITY): Payer: Medicare Other

## 2017-05-31 ENCOUNTER — Ambulatory Visit: Payer: Medicare Other | Admitting: Cardiology

## 2017-05-31 VITALS — BP 100/72 | HR 78 | Ht 69.0 in | Wt 224.2 lb

## 2017-05-31 DIAGNOSIS — I2699 Other pulmonary embolism without acute cor pulmonale: Secondary | ICD-10-CM

## 2017-05-31 DIAGNOSIS — Z9889 Other specified postprocedural states: Secondary | ICD-10-CM

## 2017-05-31 DIAGNOSIS — E785 Hyperlipidemia, unspecified: Secondary | ICD-10-CM | POA: Diagnosis not present

## 2017-05-31 DIAGNOSIS — Z951 Presence of aortocoronary bypass graft: Secondary | ICD-10-CM | POA: Diagnosis not present

## 2017-05-31 DIAGNOSIS — I5032 Chronic diastolic (congestive) heart failure: Secondary | ICD-10-CM

## 2017-05-31 DIAGNOSIS — Z7901 Long term (current) use of anticoagulants: Secondary | ICD-10-CM

## 2017-05-31 NOTE — Addendum Note (Signed)
Addended by: Kathyrn Lass on: 05/31/2017 03:40 PM   Modules accepted: Orders

## 2017-05-31 NOTE — Patient Instructions (Signed)
Stop taking coumadin  Continue your other therapy  We will schedule you for an Echocardiogram  I will see you in 3 months.

## 2017-06-02 ENCOUNTER — Encounter (HOSPITAL_COMMUNITY)
Admission: RE | Admit: 2017-06-02 | Discharge: 2017-06-02 | Disposition: A | Discharge status: Home / Self Care | Payer: Medicare Other | Source: Ambulatory Visit | Attending: Cardiology | Admitting: Cardiology

## 2017-06-02 ENCOUNTER — Encounter (HOSPITAL_COMMUNITY): Payer: Medicare Other

## 2017-06-02 DIAGNOSIS — Z9889 Other specified postprocedural states: Secondary | ICD-10-CM

## 2017-06-02 DIAGNOSIS — Z951 Presence of aortocoronary bypass graft: Secondary | ICD-10-CM

## 2017-06-02 DIAGNOSIS — C3491 Malignant neoplasm of unspecified part of right bronchus or lung: Secondary | ICD-10-CM | POA: Diagnosis not present

## 2017-06-02 NOTE — Progress Notes (Deleted)
Cardiac Individual Treatment Plan  Patient Details  Name: James [name] MRN: [id number] Date of Birth: 1950-08-26 Referring Provider:     [place] from 05/18/2017 in [place]  Referring Provider  Martinique, Peter MD      Initial Encounter Date:    CARDIAC REHAB PHASE II ORIENTATION from 05/18/2017 in [facility]  Date  05/18/17  Referring Provider  Martinique, Peter MD      Visit Diagnosis: S/P CABG x 1  S/P MVR (mitral valve repair)  Patient's Home Medications on Admission:  Current Outpatient Medications:  .  acetaminophen (TYLENOL) 325 MG tablet, Take 650 mg by mouth every 6 (six) hours as needed for moderate pain or headache., Disp: , Rfl:  .  aspirin EC 81 MG tablet, Take 1 tablet (81 mg total) by mouth daily., Disp: 90 tablet, Rfl: 3 .  atorvastatin (LIPITOR) 20 MG tablet, Take 1 tablet (20 mg total) by mouth daily., Disp: 90 tablet, Rfl: 3 .  furosemide (LASIX) 40 MG tablet, Take 1 tablet (40 mg total) by mouth daily as needed., Disp: 90 tablet, Rfl: 0 .  metoprolol tartrate (LOPRESSOR) 25 MG tablet, Take 0.5 tablets (12.5 mg total) 2 (two) times daily by mouth., Disp: 30 tablet, Rfl: 3 .  Omega-3 Fatty Acids (FISH OIL) 500 MG CAPS, Take 500 mg by mouth daily. , Disp: , Rfl:  .  potassium chloride SA (K-DUR,KLOR-CON) 20 MEQ tablet, Take 1 tablet (20 mEq total) by mouth daily as needed. With lasix, Disp: 90 tablet, Rfl: 0 .  psyllium (METAMUCIL) 58.6 % packet, Take 1 packet by mouth daily., Disp: , Rfl:  .  sodium chloride (OCEAN) 0.65 % SOLN nasal spray, Place 2 sprays into both nostrils at bedtime., Disp: , Rfl:  .  Tetrahydrozoline-Zn Sulfate (ALLERGY RELIEF EYE DROPS OP), Apply 2 drops to eye as needed (for allergies). , Disp: , Rfl:  .  traMADol (ULTRAM) 50 MG tablet, Take 1 tablet (50 mg total) by mouth every 6 (six) hours as needed (may take one or two tablets every six hrs prn)., Disp: 30  tablet, Rfl: 0 .  Undecylenic Ac-Zn Undecylenate (FUNGI-NAIL TOE & FOOT EX), Apply 1 application topically daily as needed (for nail). , Disp: , Rfl:   Past Medical History: Past Medical History:  Diagnosis Date  . Adenocarcinoma of right lung, stage 1 ([place]) 09/06/2016  . Anxiety   . Arthritis    "knees, hips, back" (10/19/2012)  . Chronic diastolic congestive heart failure ([place])   . Chronic lower back pain   . Colon polyps    10/27/2002, repeat letter 09/17/2007  . Coronary artery disease   . Coronary artery disease involving native coronary artery of native heart without angina pectoris   . Depressive disorder, not elsewhere classified    no meds  . Diabetes mellitus without complication ([place])    diet controlled- no med  (while in hosp 4/18 -elevated cbg  . Dyspnea   . Fasting hyperglycemia   . GERD (gastroesophageal reflux disease)   . Heart murmur   . Hemoptysis    abnormal CT Chest 01/29/10 - ? new GG changes RUL > not viz on plain cxr 02/26/2010  . Hypertension   . Mitral regurgitation    severe MR 08/2016  . MPN (myeloproliferative neoplasm) ([place])    1st detected 06/04/1998  . Obesity   . OSA on CPAP    last sleep study 10 years  ago  . Other and unspecified hyperlipidemia   . Peripheral vascular disease ([place]) 08/2016   after lung surgey small clots in lungs,after hip dvt-5/16  . Pneumonia    4/18  . Positive PPD 1965   "non reactive in 2012" (10/19/2012)  . Routine general medical examination at a health care facility   . S/P CABG x 1 03/24/2017   LIMA to LAD  . S/P mitral valve repair 03/24/2017   Complex valvuloplasty including artificial Gore-tex neochord placement x6 and 28 mm Sorin Annuloflex posterior annuloplasty band  . Special screening for malignant neoplasm of prostate   . Spinal stenosis, unspecified region other than cervical   . Wrist pain, left     Tobacco Use: Social History   Tobacco Use  Smoking Status Never Smoker  Smokeless Tobacco Never Used     Labs: Recent Review Flowsheet Data    Labs for ITP Cardiac and Pulmonary Rehab Latest Ref Rng & Units 03/24/2017 03/24/2017 03/24/2017 03/25/2017 05/27/2017   Cholestrol 100 - 199 mg/dL - - - - 177   LDLCALC 0 - 99 mg/dL - - - - 110(H)   LDLDIRECT mg/dL - - - - -   HDL >39 mg/dL - - - - 41   Trlycerides 0 - 149 mg/dL - - - - 128   Hemoglobin A1c 4.8 - 5.6 % - - - - -   PHART 7.350 - 7.450 7.353 7.369 - - -   PCO2ART 32.0 - 48.0 mmHg 38.9 38.1 - - -   HCO3 20.0 - 28.0 mmol/L 21.8 22.0 - - -   TCO2 22 - 32 mmol/L 23 23 21(L) 21(L) -   ACIDBASEDEF 0.0 - 2.0 mmol/L 4.0(H) 3.0(H) - - -   O2SAT % 99.0 98.0 - - -      Capillary Blood Glucose: Lab Results  Component Value Date   GLUCAP 89 03/29/2017   GLUCAP 103 (H) 03/28/2017   GLUCAP 131 (H) 03/28/2017   GLUCAP 112 (H) 03/28/2017   GLUCAP 89 03/28/2017     Exercise Target Goals:    Exercise Program Goal: Individual exercise prescription set using results from initial 6 min walk test and THRR while considering  patient's activity barriers and safety.   Exercise Prescription Goal: Initial exercise prescription builds to 30-45 minutes a day of aerobic activity, 2-3 days per week.  Home exercise guidelines will be given to patient during program as part of exercise prescription that the participant will acknowledge.  Activity Barriers & Risk Stratification: Activity Barriers & Cardiac Risk Stratification - 05/18/17 1355      Activity Barriers & Cardiac Risk Stratification   Activity Barriers  Joint Problems;Deconditioning;Muscular Weakness;Shortness of Breath;Incisional Pain;Other (comment);Left Hip Replacement    Comments  L knee pain ( possible surgery), L wrist surgery in July of 2011    Cardiac Risk Stratification  High       6 Minute Walk: 6 Minute Walk    Row Name 02/02/17 1531 05/18/17 1525       6 Minute Walk   Phase  Discharge  Initial    Distance  1700 feet  1276 feet    Distance Feet Change  153 ft  -     Walk Time  6 minutes  6 minutes    # of Rest Breaks  0  0    MPH  3.21  2.4    METS  3.45  2.8    RPE  14  17    Perceived  Dyspnea   4  3    Symptoms  No  Yes (comment)    Comments  -  SOB    Resting HR  91 bpm  80 bpm    Resting BP  134/72  140/81    Resting Oxygen Saturation   98 %  98 %    Exercise Oxygen Saturation  during 6 min walk  0 %  91 %    Max Ex. HR  153 bpm  115 bpm    Max Ex. BP  180/82  156/92    2 Minute Post BP  144/70  142/84      Interval HR   1 Minute HR  131  -    2 Minute HR  132  -    3 Minute HR  139  -    4 Minute HR  133  -    5 Minute HR  153  -    6 Minute HR  153  -    2 Minute Post HR  101  -    Interval Heart Rate?  Yes  -      Interval Oxygen   Interval Oxygen?  Yes  -    Baseline Oxygen Saturation %  98 %  -    1 Minute Oxygen Saturation %  97 %  -    1 Minute Liters of Oxygen  0 L  -    2 Minute Oxygen Saturation %  94 %  -    2 Minute Liters of Oxygen  0 L  -    3 Minute Oxygen Saturation %  93 %  -    3 Minute Liters of Oxygen  0 L  -    4 Minute Oxygen Saturation %  91 %  -    4 Minute Liters of Oxygen  0 L  -    5 Minute Oxygen Saturation %  90 %  -    5 Minute Liters of Oxygen  0 L  -    6 Minute Oxygen Saturation %  90 %  -    6 Minute Liters of Oxygen  0 L  -    2 Minute Post Oxygen Saturation %  98 %  -    2 Minute Post Liters of Oxygen  0 L  -       Oxygen Initial Assessment:   Oxygen Re-Evaluation: Oxygen Re-Evaluation    Row Name 01/06/17 2129 01/29/17 1326           Program Oxygen Prescription   Program Oxygen Prescription  None  None        Home Oxygen   Home Oxygen Device  None  None      Sleep Oxygen Prescription  None  None      Home Exercise Oxygen Prescription  None  None      Home at Rest Exercise Oxygen Prescription  None  None         Oxygen Discharge (Final Oxygen Re-Evaluation): Oxygen Re-Evaluation - 01/29/17 1326      Program Oxygen Prescription   Program Oxygen Prescription  None       Home Oxygen   Home Oxygen Device  None    Sleep Oxygen Prescription  None    Home Exercise Oxygen Prescription  None    Home at Rest Exercise Oxygen Prescription  None       Initial Exercise Prescription: Initial Exercise Prescription - 05/18/17 1500  Date of Initial Exercise RX and Referring Provider   Date  05/18/17    Referring Provider  Martinique, Peter MD      NuStep   Level  2    Minutes  15    METs  2      Track   Laps  10    Minutes  15    METs  2.16      Prescription Details   Frequency (times per week)  3    Duration  Progress to 30 minutes of continuous aerobic without signs/symptoms of physical distress      Intensity   THRR 40-80% of Max Heartrate  61-122    Ratings of Perceived Exertion  11-15    Perceived Dyspnea  0-4      Progression   Progression  Continue to progress workloads to maintain intensity without signs/symptoms of physical distress.      Resistance Training   Training Prescription  Yes    Weight  3lbs    Reps  10-15       Perform Capillary Blood Glucose checks as needed.  Exercise Prescription Changes: Exercise Prescription Changes    Row Name 12/08/16 1500 12/10/16 1556 12/15/16 1500 12/17/16 1606 12/22/16 1500     Response to Exercise   Blood Pressure (Admit)  134/78  136/64  160/84  142/70  140/76   Blood Pressure (Exercise)  170/80  126/74  186/82  140/70  154/80   Blood Pressure (Exit)  122/60  120/66  128/72  118/70  122/62   Heart Rate (Admit)  80 bpm  79 bpm  80 bpm  95 bpm  82 bpm   Heart Rate (Exercise)  118 bpm  109 bpm  122 bpm  101 bpm  121 bpm   Heart Rate (Exit)  97 bpm  82 bpm  95 bpm  89 bpm  88 bpm   Oxygen Saturation (Admit)  96 %  97 %  97 %  96 %  96 %   Oxygen Saturation (Exercise)  93 %  93 %  87 %  96 %  93 %   Oxygen Saturation (Exit)  96 %  95 %  96 %  95 %  95 %   Rating of Perceived Exertion (Exercise)  _0 Perceived Dyspnea (Exercise)  _1 Duration  Continue with 45 min  of aerobic exercise without signs/symptoms of physical distress.  Continue with 45 min of aerobic exercise without signs/symptoms of physical distress.  Continue with 45 min of aerobic exercise without signs/symptoms of physical distress.  Continue with 45 min of aerobic exercise without signs/symptoms of physical distress.  Continue with 45 min of aerobic exercise without signs/symptoms of physical distress.   Intensity  THRR unchanged  THRR unchanged  THRR unchanged  THRR unchanged  THRR unchanged     Progression   Progression  Continue to progress workloads to maintain intensity without signs/symptoms of physical distress.  Continue to progress workloads to maintain intensity without signs/symptoms of physical distress.  Continue to progress workloads to maintain intensity without signs/symptoms of physical distress.  Continue to progress workloads to maintain intensity without signs/symptoms of physical distress.  Continue to progress workloads to maintain intensity without signs/symptoms of physical distress.     Resistance Training   Training Prescription  Yes  Yes  Yes  Yes  Yes   Weight  blue bands  blue bands  blue bands  blue bands  blue bands   Reps  10-15  10-15  10-15  10-15  10-15   Time  10 Minutes  10 Minutes  10 Minutes  10 Minutes  10 Minutes     Interval Training   Interval Training  No  No  No  No  No     NuStep   Level  _0 Minutes  _1 METs  3.4  3.4  3.4  4.4  5.1     Arm/Foot Ergometer   Level  -  7  -  -  -   Minutes  -  17  -  -  -   METs  -  3.1  -  -  -     Rower   Level  4  -  _2 Minutes  17  -  _3 Track   Laps  _4 -  17   Minutes  _5 -  17   Row Name 12/24/16 1500 12/31/16 1500 01/05/17 1608 01/07/17 1603 01/12/17 1533     Response to Exercise   Blood Pressure (Admit)  146/70  138/66  126/60  120/66  130/60   Blood Pressure (Exercise)  144/70  174/82  162/82  128/58  128/58   Blood  Pressure (Exit)  110/60  128/74  134/68  118/68  118/68   Heart Rate (Admit)  84 bpm  83 bpm  80 bpm  83 bpm  83 bpm   Heart Rate (Exercise)  132 bpm  115 bpm  125 bpm  103 bpm  103 bpm   Heart Rate (Exit)  99 bpm  83 bpm  96 bpm  84 bpm  84 bpm   Oxygen Saturation (Admit)  96 %  95 %  96 %  96 %  96 %   Oxygen Saturation (Exercise)  91 %  89 %  90 %  94 %  91 %   Oxygen Saturation (Exit)  94 %  94 %  95 %  95 %  96 %   Rating of Perceived Exertion (Exercise)  _6 Perceived Dyspnea (Exercise)  _7 Duration  Continue with 45 min of aerobic exercise without signs/symptoms of physical distress.  Continue with 45 min of aerobic exercise without signs/symptoms of physical distress.  Continue with 45 min of aerobic exercise without signs/symptoms of physical distress.  Continue with 45 min of aerobic exercise without signs/symptoms of physical distress.  Continue with 45 min of aerobic exercise without signs/symptoms of physical distress.   Intensity  THRR unchanged  THRR unchanged  THRR unchanged  THRR unchanged  THRR unchanged     Progression   Progression  Continue to progress workloads to maintain intensity without signs/symptoms of physical distress.  Continue to progress workloads to maintain intensity without signs/symptoms of physical distress.  Continue to progress workloads to maintain intensity without signs/symptoms of physical distress.  Continue to progress workloads to maintain intensity without signs/symptoms of physical distress.  Continue to progress workloads to maintain intensity without signs/symptoms of physical distress.     Resistance Training  Training Prescription  Yes  Yes  Yes  Yes  Yes   Weight  blue bands  blue bands  blue bands  blue bands  blue bands   Reps  10-15  10-15  10-15  10-15  10-15   Time  10 Minutes  10 Minutes  10 Minutes  10 Minutes  10 Minutes     Interval Training   Interval Training  No  No  No  No  No     NuStep   Level   _0 Minutes  _1 METs  3.3  3.7  3.8  3.5  3.8     Track   Laps  35  16  39  22  40   Minutes  [place] Name 01/14/17 1500 01/19/17 1531 01/21/17 1521 01/26/17 1618 01/28/17 1622     Response to Exercise   Blood Pressure (Admit)  150/68  120/66  140/52  120/74  138/64   Blood Pressure (Exercise)  150/80  140/80  130/70  132/72  154/76   Blood Pressure (Exit)  120/78  122/72  124/64  122/64  120/70   Heart Rate (Admit)  79 bpm  76 bpm  77 bpm  67 bpm  89 bpm   Heart Rate (Exercise)  113 bpm  107 bpm  124 bpm  114 bpm  134 bpm   Heart Rate (Exit)  93 bpm  85 bpm  88 bpm  90 bpm  91 bpm   Oxygen Saturation (Admit)  96 %  96 %  95 %  96 %  97 %   Oxygen Saturation (Exercise)  93 %  93 %  90 %  91 %  91 %   Oxygen Saturation (Exit)  96 %  95 %  95 %  94 %  94 %   Rating of Perceived Exertion (Exercise)  _2 Perceived Dyspnea (Exercise)  _3 Duration  Continue with 45 min of aerobic exercise without signs/symptoms of physical distress.  Continue with 45 min of aerobic exercise without signs/symptoms of physical distress.  Continue with 45 min of aerobic exercise without signs/symptoms of physical distress.  Continue with 45 min of aerobic exercise without signs/symptoms of physical distress.  Continue with 45 min of aerobic exercise without signs/symptoms of physical distress.   Intensity  THRR unchanged  THRR unchanged  THRR unchanged  THRR unchanged  THRR unchanged     Progression   Progression  Continue to progress workloads to maintain intensity without signs/symptoms of physical distress.  Continue to progress workloads to maintain intensity without signs/symptoms of physical distress.  Continue to progress workloads to maintain intensity without signs/symptoms of physical distress.  Continue to progress workloads to maintain intensity without signs/symptoms of physical distress.  Continue to progress workloads to  maintain intensity without signs/symptoms of physical distress.     Resistance Training   Training Prescription  Yes  Yes  Yes  Yes  Yes   Weight  blue bands  blue bands  blue bands  blue bands  blue bands   Reps  10-15  10-15  10-15  10-15  10-15   Time  10 Minutes  10 Minutes  10 Minutes  10  Minutes  10 Minutes     Interval Training   Interval Training  No  No  No  No  No     NuStep   Level  7  -  _0 Minutes  17  -  _1 METs  5.2  -  3.4  3.4  4.1     Rower   Level  21  6  -  4  -   Minutes  17  17  -  17  -     Track   Laps  -  85  63  14  97   Minutes  -  02  63  78  58   IFO Name 05/24/17 1600 05/28/17 1018           Response to Exercise   Blood Pressure (Admit)  142/90  128/80      Blood Pressure (Exercise)  160/80  128/64      Blood Pressure (Exit)  108/70  138/82      Heart Rate (Admit)  91 bpm  85 bpm      Heart Rate (Exercise)  116 bpm  110 bpm      Heart Rate (Exit)  89 bpm  79 bpm      Oxygen Saturation (Admit)  97 %  96 %      Oxygen Saturation (Exercise)  97 %  94 %      Oxygen Saturation (Exit)  98 %  98 %      Rating of Perceived Exertion (Exercise)  13  13      Perceived Dyspnea (Exercise)  3  3      Duration  Continue with 30 min of aerobic exercise without signs/symptoms of physical distress.  Continue with 30 min of aerobic exercise without signs/symptoms of physical distress.      Intensity  THRR unchanged  THRR unchanged        Progression   Progression  Continue to progress workloads to maintain intensity without signs/symptoms of physical distress.  Continue to progress workloads to maintain intensity without signs/symptoms of physical distress.      Average METs  2.7  2.7        Resistance Training   Training Prescription  Yes  Yes      Weight  3lbs  3lbs      Reps  10-15  10-15      Time  10 Minutes  10 Minutes        Interval Training   Interval Training  No  No        NuStep   Level  2  4      Minutes  15  15       METs  2.9  3.5        Track   Laps  14  15      Minutes  15  15      METs  2.51  3.61         Exercise Comments: Exercise Comments    Row Name 05/24/17 1617           Exercise Comments  Pt oriented to exercise equipment today. Pt responded well to exercise session; will continue to monitor and progress pt's activity levels as tolerated.           Exercise Goals and Review: Exercise Goals    Row Name 05/18/17  1359             Exercise Goals   Increase Physical Activity  Yes       Intervention  Provide advice, education, support and counseling about physical activity/exercise needs.;Develop an individualized exercise prescription for aerobic and resistive training based on initial evaluation findings, risk stratification, comorbidities and participant's personal goals.       Expected Outcomes  Achievement of increased cardiorespiratory fitness and enhanced flexibility, muscular endurance and strength shown through measurements of functional capacity and personal statement of participant.       Increase Strength and Stamina  Yes       Intervention  Provide advice, education, support and counseling about physical activity/exercise needs.;Develop an individualized exercise prescription for aerobic and resistive training based on initial evaluation findings, risk stratification, comorbidities and participant's personal goals.       Expected Outcomes  Achievement of increased cardiorespiratory fitness and enhanced flexibility, muscular endurance and strength shown through measurements of functional capacity and personal statement of participant.       Able to understand and use rate of perceived exertion (RPE) scale  Yes       Intervention  Provide education and explanation on how to use RPE scale       Expected Outcomes  Short Term: Able to use RPE daily in rehab to express subjective intensity level;Long Term:  Able to use RPE to guide intensity level when exercising independently        Knowledge and understanding of Target Heart Rate Range (THRR)  Yes       Intervention  Provide education and explanation of THRR including how the numbers were predicted and where they are located for reference       Expected Outcomes  Short Term: Able to state/look up THRR;Long Term: Able to use THRR to govern intensity when exercising independently;Short Term: Able to use daily as guideline for intensity in rehab       Able to check pulse independently  Yes       Intervention  Provide education and demonstration on how to check pulse in carotid and radial arteries.;Review the importance of being able to check your own pulse for safety during independent exercise       Expected Outcomes  Short Term: Able to explain why pulse checking is important during independent exercise;Long Term: Able to check pulse independently and accurately       Understanding of Exercise Prescription  Yes       Intervention  Provide education, explanation, and written materials on patient's individual exercise prescription       Expected Outcomes  Short Term: Able to explain program exercise prescription;Long Term: Able to explain home exercise prescription to exercise independently          Exercise Goals Re-Evaluation : Exercise Goals Re-Evaluation    Row Name 12/04/16 1341 01/05/17 0951 01/25/17 1412         Exercise Goal Re-Evaluation   Exercise Goals Review  Increase Strenth and Stamina;Increase Physical Activity  Increase Strength and Stamina;Increase Physical Activity;Able to understand and use Dyspnea scale;Able to understand and use rate of perceived exertion (RPE) scale;Knowledge and understanding of Target Heart Rate Range (THRR);Understanding of Exercise Prescription  Increase Strength and Stamina;Increase Physical Activity;Able to understand and use Dyspnea scale;Able to understand and use rate of perceived exertion (RPE) scale;Knowledge and understanding of Target Heart Rate Range (THRR);Understanding  of Exercise Prescription     Comments  Patient is progressing well in  program. Comes to each rehab session motivated to work hard. Struggles with shortness of breath. Home exercise completed. Will cont. to monitor and progress as able.   Patient is progressing well in program. Comes to each rehab session motivated to work hard. Struggles with shortness of breath. Walks up to 16 laps (200 feet each) in 15 minutes.  Home exercise completed. Will cont. to monitor and progress as able.   Patient is progressing well in program. Comes to each rehab session motivated to work hard. Struggles with shortness of breath. Walks up to 16 laps (200 feet each) in 15 minutes.  Home exercise completed. Will cont. to monitor and progress as able.      Expected Outcomes  Through exercising at rehab and at home, patient will increase physical strength and stamina and find ADL's easier to perform.   Through exercising at rehab and at home, patient will increase physical strength and stamina and find ADL's easier to perform.   Through exercising at rehab and at home, patient will increase physical strength and stamina and find ADL's easier to perform.          Discharge Exercise Prescription (Final Exercise Prescription Changes): Exercise Prescription Changes - 05/28/17 1018      Response to Exercise   Blood Pressure (Admit)  128/80    Blood Pressure (Exercise)  128/64    Blood Pressure (Exit)  138/82    Heart Rate (Admit)  85 bpm    Heart Rate (Exercise)  110 bpm    Heart Rate (Exit)  79 bpm    Oxygen Saturation (Admit)  96 %    Oxygen Saturation (Exercise)  94 %    Oxygen Saturation (Exit)  98 %    Rating of Perceived Exertion (Exercise)  13    Perceived Dyspnea (Exercise)  3    Duration  Continue with 30 min of aerobic exercise without signs/symptoms of physical distress.    Intensity  THRR unchanged      Progression   Progression  Continue to progress workloads to maintain intensity without signs/symptoms of  physical distress.    Average METs  2.7      Resistance Training   Training Prescription  Yes    Weight  3lbs    Reps  10-15    Time  10 Minutes      Interval Training   Interval Training  No      NuStep   Level  4    Minutes  15    METs  3.5      Track   Laps  15    Minutes  15    METs  3.61       Nutrition:  Target Goals: Understanding of nutrition guidelines, daily intake of sodium <1522m, cholesterol <2037m calories 30% from fat and 7% or less from saturated fats, daily to have 5 or more servings of fruits and vegetables.  Biometrics: Pre Biometrics - 05/18/17 1531      Pre Biometrics   Height  5' 7" (1.702 m)    Weight  225 lb 5 oz (102.2 kg)    Waist Circumference  46 inches    Hip Circumference  46.5 inches    Waist to Hip Ratio  0.99 %    BMI (Calculated)  35.28    Triceps Skinfold  15 mm    % Body Fat  33.4 %    Grip Strength  36 kg    Flexibility  13 in  Single Leg Stand  30 seconds      Post Biometrics - 02/02/17 1535       Post  Biometrics   Grip Strength  30 kg       Nutrition Therapy Plan and Nutrition Goals: Nutrition Therapy & Goals - 05/19/17 0913      Nutrition Therapy   Diet  Carb Modified, Heart Healthy      Personal Nutrition Goals   Nutrition Goal  Pt to be able to name foods that affect blood sugar.     Personal Goal #2  Pt to identify food quantities necessary to achieve weight loss of 6-14 lb  at graduation from cardiac rehab. Goal wt of 210 lb desired.       Intervention Plan   Intervention  Prescribe, educate and counsel regarding individualized specific dietary modifications aiming towards targeted core components such as weight, hypertension, lipid management, diabetes, heart failure and other comorbidities.    Expected Outcomes  Short Term Goal: Understand basic principles of dietary content, such as calories, fat, sodium, cholesterol and nutrients.;Long Term Goal: Adherence to prescribed nutrition plan.        Nutrition Assessments: Nutrition Assessments - 05/19/17 0913      MEDFICTS Scores   Pre Score  29       Nutrition Goals Re-Evaluation: Nutrition Goals Re-Evaluation    [place] Name 02/05/17 1114             Goals   Current Weight  101 lb 1.6 oz (45.9 kg)       Nutrition Goal  Pt to be able to name foods that affect blood sugar.        Comment  Pt is making healthier food choices (including desserts). Pt has maintained his wt.          Personal Goal #2 Re-Evaluation   Personal Goal #2  Pt to maintain his wt around 220 lb until pt is ready to focus on wt loss.          Personal Goal #3 Re-Evaluation   Personal Goal #3  Describe the benefit of including fruits, vegetables, whole grains, and low-fat dairy products in a healthy meal plan.         Personal Goal #4 Re-Evaluation   Personal Goal #4  Pt to increase frequency of food consumed to at least a small snack (e.g. 1/4 cup nuts, Greek yogurt) every 5 hours.          Nutrition Goals Re-Evaluation: Nutrition Goals Re-Evaluation    [place] Name 02/05/17 1114             Goals   Current Weight  101 lb 1.6 oz (45.9 kg)       Nutrition Goal  Pt to be able to name foods that affect blood sugar.        Comment  Pt is making healthier food choices (including desserts). Pt has maintained his wt.          Personal Goal #2 Re-Evaluation   Personal Goal #2  Pt to maintain his wt around 220 lb until pt is ready to focus on wt loss.          Personal Goal #3 Re-Evaluation   Personal Goal #3  Describe the benefit of including fruits, vegetables, whole grains, and low-fat dairy products in a healthy meal plan.         Personal Goal #4 Re-Evaluation   Personal Goal #4  Pt to increase frequency of  food consumed to at least a small snack (e.g. 1/4 cup nuts, Greek yogurt) every 5 hours.          Nutrition Goals Discharge (Final Nutrition Goals Re-Evaluation): Nutrition Goals Re-Evaluation - 02/05/17 1114      Goals   Current  Weight  101 lb 1.6 oz (45.9 kg)    Nutrition Goal  Pt to be able to name foods that affect blood sugar.     Comment  Pt is making healthier food choices (including desserts). Pt has maintained his wt.       Personal Goal #2 Re-Evaluation   Personal Goal #2  Pt to maintain his wt around 220 lb until pt is ready to focus on wt loss.       Personal Goal #3 Re-Evaluation   Personal Goal #3  Describe the benefit of including fruits, vegetables, whole grains, and low-fat dairy products in a healthy meal plan.      Personal Goal #4 Re-Evaluation   Personal Goal #4  Pt to increase frequency of food consumed to at least a small snack (e.g. 1/4 cup nuts, Greek yogurt) every 5 hours.       Psychosocial: Target Goals: Acknowledge presence or absence of significant depression and/or stress, maximize coping skills, provide positive support system. Participant is able to verbalize types and ability to use techniques and skills needed for reducing stress and depression.  Initial Review & Psychosocial Screening: Initial Psych Review & Screening - 05/18/17 1408      Initial Review   Current issues with  None Identified    Source of Stress Concerns  None Identified      Family Dynamics   Good Support System?  Yes spouse    Comments  no psychoosocial needs identified, no interventions necessary       Barriers   Psychosocial barriers to participate in program  There are no identifiable barriers or psychosocial needs.      Screening Interventions   Interventions  Encouraged to exercise       Quality of Life Scores: Quality of Life - 05/28/17 1626      Quality of Life Scores   Health/Function Pre  17.23 % pt main concern is dyspnea on exertion. DOE mostly present with stairclimbing.      Socioeconomic Pre  25.44 %    Psych/Spiritual Pre  24 %    Family Pre  27.6 %    GLOBAL Pre  21.94 %      Scores of 19 and below usually indicate a poorer quality of life in these areas.  A difference of   2-3 points is a clinically meaningful difference.  A difference of 2-3 points in the total score of the Quality of Life Index has been associated with significant improvement in overall quality of life, self-image, physical symptoms, and general health in studies assessing change in quality of life.  PHQ-9: Recent Review Flowsheet Data    Depression screen [facility] 2/9 05/24/2017 02/02/2017 10/19/2016 07/29/2016 07/27/2016   Decreased Interest 0 0 1 0 0   Down, Depressed, Hopeless 1 0 3 0 0   PHQ - 2 Score 1 0 4 0 0   Altered sleeping - - 3 - -   Tired, decreased energy - - 3 - -   Change in appetite - - 3 - -   Feeling bad or failure about yourself  - - 0 - -   Trouble concentrating - - 0 - -   Moving  slowly or fidgety/restless - - 0 - -   Suicidal thoughts - - 0 - -   PHQ-9 Score - - 13 - -   Difficult doing work/chores - - Not difficult at all - -     Interpretation of Total Score  Total Score Depression Severity:  1-4 = Minimal depression, 5-9 = Mild depression, 10-14 = Moderate depression, 15-19 = Moderately severe depression, 20-27 = Severe depression   Psychosocial Evaluation and Intervention: Psychosocial Evaluation - 05/24/17 1538      Psychosocial Evaluation & Interventions   Interventions  Encouraged to exercise with the program and follow exercise prescription    Comments  pt with health related anxiety. pt has feels overwhelmed from progressive medical conditions this year. pt looking forward to regain strength/stamina to get back to normal life.    Continue Psychosocial Services   Follow up required by staff       Psychosocial Re-Evaluation: Psychosocial Re-Evaluation    [place] Name 12/08/16 1624 12/08/16 1625 01/06/17 2130 01/29/17 1328 05/28/17 1630     Psychosocial Re-Evaluation   Current issues with  -  Current Stress Concerns  Current Stress Concerns  None Identified  None Identified   Comments  patient is discouraged with his continue shortness of breath but is beginning  to accept that he may need valve surgery inorder for it to improv  -  patient has accepted the need for valve surgery. has appointment with cardiologist september 5 to discuss options  -  pt demonstrates eagerness to participate in CR program to increase functional ability.     Interventions  -  Encouraged to attend Pulmonary Rehabilitation for the exercise encouraged patient to re-evaluate need for valve surgery with cardiothoracic surgeon  Encouraged to attend Pulmonary Rehabilitation for the exercise  Encouraged to attend Pulmonary Rehabilitation for the exercise  Encouraged to attend Cardiac Rehabilitation for the exercise   Continue Psychosocial Services   -  Follow up required by staff  Follow up required by staff  Follow up required by staff  Follow up required by staff   Comments  -  if his shortness of breath is related to his valve then pulmonary rehab may not improve his shortness of breath as he anticipated.  if his shortness of breath is related to his valve then pulmonary rehab may not improve his shortness of breath as he anticipated.  -  -     Initial Review   Source of Stress Concerns  -  Unable to participate in former interests or hobbies;Unable to perform yard/household activities  Unable to participate in former interests or hobbies;Unable to perform yard/household activities  -  -      Psychosocial Discharge (Final Psychosocial Re-Evaluation): Psychosocial Re-Evaluation - 05/28/17 1630      Psychosocial Re-Evaluation   Current issues with  None Identified    Comments  pt demonstrates eagerness to participate in CR program to increase functional ability.      Interventions  Encouraged to attend Cardiac Rehabilitation for the exercise    Continue Psychosocial Services   Follow up required by staff       Vocational Rehabilitation: Provide vocational rehab assistance to qualifying candidates.   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 Participant will state understanding/return demonstration of topics presented.  Learning Barriers/Preferences: Learning Barriers/Preferences - 05/18/17 1355      Learning Barriers/Preferences   Learning Barriers  Sight    Learning Preferences  Audio;Computer/Internet;Pictoral;Skilled Demonstration;Verbal Instruction;Video;Written Material;Individual Instruction;Group Instruction       Education Topics: Count Your Pulse:  -Group instruction provided by verbal instruction, demonstration, patient participation and written materials to support subject.  Instructors address importance of being able to find your pulse and how to count your pulse when at home without a heart monitor.  Patients get hands on experience counting their pulse with staff help and individually.   Heart Attack, Angina, and Risk Factor Modification:  -Group instruction provided by verbal instruction, video, and written materials to support subject.  Instructors address signs and symptoms of angina and heart attacks.    Also discuss risk factors for heart disease and how to make changes to improve heart health risk factors.   Functional Fitness:  -Group instruction provided by verbal instruction, demonstration, patient participation, and written materials to support subject.  Instructors address safety measures for doing things around the house.  Discuss how to get up and down off the floor, how to pick things up properly, how to safely get out of a chair without assistance, and balance training.   Meditation and Mindfulness:  -Group instruction provided by verbal instruction, patient participation, and written materials to support subject.  Instructor addresses  importance of mindfulness and meditation practice to help reduce stress and improve awareness.  Instructor also leads participants through a meditation exercise.    CARDIAC REHAB PHASE II EXERCISE from 05/28/2017 in [place]  Date  12/10/16  Educator  Mable Fill [[place]]  Instruction Review Code  2- meets goals/outcomes      Stretching for Flexibility and Mobility:  -Group instruction provided by verbal instruction, patient participation, and written materials to support subject.  Instructors lead participants through series of stretches that are designed to increase flexibility thus improving mobility.  These stretches are additional exercise for major muscle groups that are typically performed during regular warm up and cool down.   Hands Only CPR:  -Group verbal, video, and participation provides a basic overview of AHA guidelines for community CPR. Role-play of emergencies allow participants the opportunity to practice calling for help and chest compression technique with discussion of AED use.   Hypertension: -Group verbal and written instruction that provides a basic overview of hypertension including the most recent diagnostic guidelines, risk factor reduction with self-care instructions and medication management.    Nutrition I class: Heart Healthy Eating:  -Group instruction provided by PowerPoint slides, verbal discussion, and written materials to support subject matter. The instructor gives an explanation and review of the Therapeutic Lifestyle Changes diet recommendations, which includes a discussion on lipid goals, dietary fat, sodium, fiber, plant stanol/sterol esters, sugar, and the components of a well-balanced, healthy diet.   CARDIAC REHAB PHASE II EXERCISE from 05/28/2017 in [place]  Date  05/18/17  Educator  RD  Instruction Review Code  Not applicable      Nutrition II class: Lifestyle Skills:  -Group  instruction provided by PowerPoint slides, verbal discussion, and written materials to support subject matter. The instructor gives an explanation and review of label reading, grocery shopping for heart health, heart healthy recipe modifications, and ways to make healthier choices when eating out.   CARDIAC REHAB PHASE II EXERCISE from 05/28/2017 in  [place]  Date  05/18/17  Educator  RD  Instruction Review Code  Not applicable      Diabetes Question & Answer:  -Group instruction provided by PowerPoint slides, verbal discussion, and written materials to support subject matter. The instructor gives an explanation and review of diabetes co-morbidities, pre- and post-prandial blood glucose goals, pre-exercise blood glucose goals, signs, symptoms, and treatment of hypoglycemia and hyperglycemia, and foot care basics.   CARDIAC REHAB PHASE II EXERCISE from 05/28/2017 in [place]  Date  05/28/17  Educator  RD  Instruction Review Code  2- meets goals/outcomes      Diabetes Blitz:  -Group instruction provided by PowerPoint slides, verbal discussion, and written materials to support subject matter. The instructor gives an explanation and review of the physiology behind type 1 and type 2 diabetes, diabetes medications and rational behind using different medications, pre- and post-prandial blood glucose recommendations and Hemoglobin A1c goals, diabetes diet, and exercise including blood glucose guidelines for exercising safely.    Portion Distortion:  -Group instruction provided by PowerPoint slides, verbal discussion, written materials, and food models to support subject matter. The instructor gives an explanation of serving size versus portion size, changes in portions sizes over the last 20 years, and what consists of a serving from each food group.   Stress Management:  -Group instruction provided by verbal instruction, video, and  written materials to support subject matter.  Instructors review role of stress in heart disease and how to cope with stress positively.     Exercising on Your Own:  -Group instruction provided by verbal instruction, power point, and written materials to support subject.  Instructors discuss benefits of exercise, components of exercise, frequency and intensity of exercise, and end points for exercise.  Also discuss use of nitroglycerin and activating EMS.  Review options of places to exercise outside of rehab.  Review guidelines for sex with heart disease.   CARDIAC REHAB PHASE II EXERCISE from 05/28/2017 in [place]  Date  05/26/17  Educator  EP  Instruction Review Code  2- meets goals/outcomes      Cardiac Drugs I:  -Group instruction provided by verbal instruction and written materials to support subject.  Instructor reviews cardiac drug classes: antiplatelets, anticoagulants, beta blockers, and statins.  Instructor discusses reasons, side effects, and lifestyle considerations for each drug class.   Cardiac Drugs II:  -Group instruction provided by verbal instruction and written materials to support subject.  Instructor reviews cardiac drug classes: angiotensin converting enzyme inhibitors (ACE-I), angiotensin II receptor blockers (ARBs), nitrates, and calcium channel blockers.  Instructor discusses reasons, side effects, and lifestyle considerations for each drug class.   Anatomy and Physiology of the Circulatory System:  Group verbal and written instruction and models provide basic cardiac anatomy and physiology, with the coronary electrical and arterial systems. Review of: AMI, Angina, Valve disease, Heart Failure, Peripheral Artery Disease, Cardiac Arrhythmia, Pacemakers, and the ICD.   Other Education:  -Group or individual verbal, written, or video instructions that support the educational goals of the cardiac rehab program.   Knowledge  Questionnaire Score: Knowledge Questionnaire Score - 05/18/17 1407      Knowledge Questionnaire Score   Pre Score  24/24       Core Components/Risk Factors/Patient Goals at Admission: Personal Goals and Risk Factors at Admission - 05/18/17 1403      Core Components/Risk Factors/Patient Goals on Admission    Weight  Management  Obesity    Improve shortness of breath with ADL's  Yes    Intervention  Provide education, individualized exercise plan and daily activity instruction to help decrease symptoms of SOB with activities of daily living.    Expected Outcomes  Short Term: Achieves a reduction of symptoms when performing activities of daily living.    Develop more efficient breathing techniques such as purse lipped breathing and diaphragmatic breathing; and practicing self-pacing with activity  Yes    Intervention  Provide education, demonstration and support about specific breathing techniuqes utilized for more efficient breathing. Include techniques such as pursed lipped breathing, diaphragmatic breathing and self-pacing activity.    Expected Outcomes  Short Term: Participant will be able to demonstrate and use breathing techniques as needed throughout daily activities.    Diabetes  Yes    Intervention  Provide education about signs/symptoms and action to take for hypo/hyperglycemia.;Provide education about proper nutrition, including hydration, and aerobic/resistive exercise prescription along with prescribed medications to achieve blood glucose in normal ranges: Fasting glucose 65-99 mg/dL    Expected Outcomes  Short Term: Participant verbalizes understanding of the signs/symptoms and immediate care of hyper/hypoglycemia, proper foot care and importance of medication, aerobic/resistive exercise and nutrition plan for blood glucose control.;Long Term: Attainment of HbA1C < 7%.    Heart Failure  Yes    Intervention  Provide a combined exercise and nutrition program that is supplemented with  education, support and counseling about heart failure. Directed toward relieving symptoms such as shortness of breath, decreased exercise tolerance, and extremity edema.    Expected Outcomes  Improve functional capacity of life;Short term: Attendance in program 2-3 days a week with increased exercise capacity. Reported lower sodium intake. Reported increased fruit and vegetable intake. Reports medication compliance.;Short term: Daily weights obtained and reported for increase. Utilizing diuretic protocols set by physician.;Long term: Adoption of self-care skills and reduction of barriers for early signs and symptoms recognition and intervention leading to self-care maintenance.    Lipids  Yes    Intervention  Provide education and support for participant on nutrition & aerobic/resistive exercise along with prescribed medications to achieve LDL <75m, HDL >427m    Expected Outcomes  Short Term: Participant states understanding of desired cholesterol values and is compliant with medications prescribed. Participant is following exercise prescription and nutrition guidelines.;Long Term: Cholesterol controlled with medications as prescribed, with individualized exercise RX and with personalized nutrition plan. Value goals: LDL < 7045mHDL > 40 mg.       Core Components/Risk Factors/Patient Goals Review:  Goals and Risk Factor Review    Row Name 12/08/16 1617 01/06/17 2129 01/29/17 1326 02/22/17 0854 05/24/17 1534     Core Components/Risk Factors/Patient Goals Review   Personal Goals Review  Weight Management/Obesity;Improve shortness of breath with ADL's;Increase knowledge of respiratory medications and ability to use respiratory devices properly.;Develop more efficient breathing techniques such as purse lipped breathing and diaphragmatic breathing and practicing self-pacing with activity.  Weight Management/Obesity;Improve shortness of breath with ADL's;Increase knowledge of respiratory medications and  ability to use respiratory devices properly.;Develop more efficient breathing techniques such as purse lipped breathing and diaphragmatic breathing and practicing self-pacing with activity.  Weight Management/Obesity;Improve shortness of breath with ADL's;Increase knowledge of respiratory medications and ability to use respiratory devices properly.;Develop more efficient breathing techniques such as purse lipped breathing and diaphragmatic breathing and practicing self-pacing with activity.  -  Weight Management/Obesity;Diabetes;Heart Failure   Review  patient has progressed well over the past 30 days in  pulmonary rehab. he has tolerated workload increases although is limited by his shortness of breath. This may not be related to any pulmonary illness but to his need for a valve replacement. he is utilizing PLB when appropriate. He is beginning to ask question regarding the effects of a round abdomen on breathing and has realized that if he can loose some weight his breathing may improve somewhat.   patient has progressed well over the past 30 days in pulmonary rehab. he has tolerated workload increases although is limited by his shortness of breath. This may not be related to any pulmonary illness but to his need for a valve replacement. he is utilizing PLB when appropriate. He is beginning to ask question regarding the effects of a round abdomen on breathing and has realized that if he can loose some weight his breathing may improve somewhat.   patient continues to progress well in pulmonary rehab. He is able to walk 21 laps in 15 min which is almost a mile. His shortness of breath is not improving and may be getting worse with exertions however this is valve related and unfortunately will not improve with rehab. his cardiologist gave him clearence to continue to push himself at rehab.  patient met all pulmonary rehab goals at discharge except shortness of breath. His shortness of breath is assumend to be related  to a heart valve and not a lung problem.   pt with multiple CAD RF demonstrates eagerness to participate in CR program.  pt newly diagnosed diet controlled diabetic. pt instructed to participate in Diabetes Q&A education opporutunity.     Expected Outcomes  expect to see more progression of goals in the next 30 days  expect to see more progression of goals in the next 30 days  expect to see more progression of goals in the next 30 days  -  pt will participate in CR exercise, nutrition and education opportunities to improve overall RF.     [place] Name 05/28/17 1629             Core Components/Risk Factors/Patient Goals Review   Personal Goals Review  Weight Management/Obesity;Diabetes;Heart Failure       Review  pt with multiple CAD RF demonstrates eagerness to participate in CR program.  pt pleased he is able to walk up 14 steps without dyspnea and is hoping to further increase funcational ability. pt newly diagnosed diet controlled diabetic. pt participated in Diabetes Q&A education opporutunity.         Expected Outcomes  pt will participate in CR exercise, nutrition and education opportunities to improve overall RF.            Core Components/Risk Factors/Patient Goals at Discharge (Final Review):  Goals and Risk Factor Review - 05/28/17 1629      Core Components/Risk Factors/Patient Goals Review   Personal Goals Review  Weight Management/Obesity;Diabetes;Heart Failure    Review  pt with multiple CAD RF demonstrates eagerness to participate in CR program.  pt pleased he is able to walk up 14 steps without dyspnea and is hoping to further increase funcational ability. pt newly diagnosed diet controlled diabetic. pt participated in Diabetes Q&A education opporutunity.      Expected Outcomes  pt will participate in CR exercise, nutrition and education opportunities to improve overall RF.         ITP Comments: ITP Comments    Row Name 05/18/17 1352 05/24/17 1532 06/02/17 1101       ITP  Comments  Medical Director- Dr. Fransico Him, MD.  pt started group exercise program.  pt demonstrates eagerness to participate in CR program.   30 day ITP review.  pt demonstrates eagerness to participate in CR progrm.         Comments:

## 2017-06-02 NOTE — Progress Notes (Signed)
Reviewed home exercise with pt today.  Pt plans to walk for exercise, 2x/week in addition to coming to cardiac rehab.  Reviewed THR, pulse, RPE, sign and symptoms, and when to call 911 or MD.  Also discussed weather considerations and indoor options.  Pt voiced understanding.   [employer], ACSM RCEP

## 2017-06-02 NOTE — Progress Notes (Signed)
Cardiac Individual Treatment Plan  Patient Details  Name: James [name] MRN: [id number] Date of Birth: 02/26/1951 Referring Provider:     CARDIAC REHAB PHASE II ORIENTATION from 05/18/2017 in [facility]  Referring Provider  Martinique, Peter MD      Initial Encounter Date:    CARDIAC REHAB PHASE II ORIENTATION from 05/18/2017 in [facility]  Date  05/18/17  Referring Provider  Martinique, Peter MD      Visit Diagnosis: No diagnosis found.  Patient's Home Medications on Admission:  Current Outpatient Medications:  .  acetaminophen (TYLENOL) 325 MG tablet, Take 650 mg by mouth every 6 (six) hours as needed for moderate pain or headache., Disp: , Rfl:  .  aspirin EC 81 MG tablet, Take 1 tablet (81 mg total) by mouth daily., Disp: 90 tablet, Rfl: 3 .  atorvastatin (LIPITOR) 20 MG tablet, Take 1 tablet (20 mg total) by mouth daily., Disp: 90 tablet, Rfl: 3 .  furosemide (LASIX) 40 MG tablet, Take 1 tablet (40 mg total) by mouth daily as needed., Disp: 90 tablet, Rfl: 0 .  metoprolol tartrate (LOPRESSOR) 25 MG tablet, Take 0.5 tablets (12.5 mg total) 2 (two) times daily by mouth., Disp: 30 tablet, Rfl: 3 .  Omega-3 Fatty Acids (FISH OIL) 500 MG CAPS, Take 500 mg by mouth daily. , Disp: , Rfl:  .  potassium chloride SA (K-DUR,KLOR-CON) 20 MEQ tablet, Take 1 tablet (20 mEq total) by mouth daily as needed. With lasix, Disp: 90 tablet, Rfl: 0 .  psyllium (METAMUCIL) 58.6 % packet, Take 1 packet by mouth daily., Disp: , Rfl:  .  sodium chloride (OCEAN) 0.65 % SOLN nasal spray, Place 2 sprays into both nostrils at bedtime., Disp: , Rfl:  .  Tetrahydrozoline-Zn Sulfate (ALLERGY RELIEF EYE DROPS OP), Apply 2 drops to eye as needed (for allergies). , Disp: , Rfl:  .  traMADol (ULTRAM) 50 MG tablet, Take 1 tablet (50 mg total) by mouth every 6 (six) hours as needed (may take one or two tablets every six hrs prn)., Disp: 30 tablet, Rfl: 0 .   Undecylenic Ac-Zn Undecylenate (FUNGI-NAIL TOE & FOOT EX), Apply 1 application topically daily as needed (for nail). , Disp: , Rfl:   Past Medical History: Past Medical History:  Diagnosis Date  . Adenocarcinoma of right lung, stage 1 ([name]) 09/06/2016  . Anxiety   . Arthritis    "knees, hips, back" (10/19/2012)  . Chronic diastolic congestive heart failure ([place])   . Chronic lower back pain   . Colon polyps    10/27/2002, repeat letter 09/17/2007  . Coronary artery disease   . Coronary artery disease involving native coronary artery of native heart without angina pectoris   . Depressive disorder, not elsewhere classified    no meds  . Diabetes mellitus without complication ([place])    diet controlled- no med  (while in hosp 4/18 -elevated cbg  . Dyspnea   . Fasting hyperglycemia   . GERD (gastroesophageal reflux disease)   . Heart murmur   . Hemoptysis    abnormal CT Chest 01/29/10 - ? new GG changes RUL > not viz on plain cxr 02/26/2010  . Hypertension   . Mitral regurgitation    severe MR 08/2016  . MPN (myeloproliferative neoplasm) ([place])    1st detected 06/04/1998  . Obesity   . OSA on CPAP    last sleep study 10 years ago  . Other and unspecified hyperlipidemia   .  Peripheral vascular disease ([place]) 08/2016   after lung surgey small clots in lungs,after hip dvt-5/16  . Pneumonia    4/18  . Positive PPD 1965   "non reactive in 2012" (10/19/2012)  . Routine general medical examination at a health care facility   . S/P CABG x 1 03/24/2017   LIMA to LAD  . S/P mitral valve repair 03/24/2017   Complex valvuloplasty including artificial Gore-tex neochord placement x6 and 28 mm Sorin Annuloflex posterior annuloplasty band  . Special screening for malignant neoplasm of prostate   . Spinal stenosis, unspecified region other than cervical   . Wrist pain, left     Tobacco Use: Social History   Tobacco Use  Smoking Status Never Smoker  Smokeless Tobacco Never Used    Labs: Recent  Review Flowsheet Data    Labs for ITP Cardiac and Pulmonary Rehab Latest Ref Rng & Units 03/24/2017 03/24/2017 03/24/2017 03/25/2017 05/27/2017   Cholestrol 100 - 199 mg/dL - - - - 177   LDLCALC 0 - 99 mg/dL - - - - 110(H)   LDLDIRECT mg/dL - - - - -   HDL >39 mg/dL - - - - 41   Trlycerides 0 - 149 mg/dL - - - - 128   Hemoglobin A1c 4.8 - 5.6 % - - - - -   PHART 7.350 - 7.450 7.353 7.369 - - -   PCO2ART 32.0 - 48.0 mmHg 38.9 38.1 - - -   HCO3 20.0 - 28.0 mmol/L 21.8 22.0 - - -   TCO2 22 - 32 mmol/L 23 23 21(L) 21(L) -   ACIDBASEDEF 0.0 - 2.0 mmol/L 4.0(H) 3.0(H) - - -   O2SAT % 99.0 98.0 - - -      Capillary Blood Glucose: Lab Results  Component Value Date   GLUCAP 89 03/29/2017   GLUCAP 103 (H) 03/28/2017   GLUCAP 131 (H) 03/28/2017   GLUCAP 112 (H) 03/28/2017   GLUCAP 89 03/28/2017     Exercise Target Goals:    Exercise Program Goal: Individual exercise prescription set using results from initial 6 min walk test and THRR while considering  patient's activity barriers and safety.   Exercise Prescription Goal: Initial exercise prescription builds to 30-45 minutes a day of aerobic activity, 2-3 days per week.  Home exercise guidelines will be given to patient during program as part of exercise prescription that the participant will acknowledge.  Activity Barriers & Risk Stratification: Activity Barriers & Cardiac Risk Stratification - 05/18/17 1355      Activity Barriers & Cardiac Risk Stratification   Activity Barriers  Joint Problems;Deconditioning;Muscular Weakness;Shortness of Breath;Incisional Pain;Other (comment);Left Hip Replacement    Comments  L knee pain ( possible surgery), L wrist surgery in July of 2011    Cardiac Risk Stratification  High       6 Minute Walk: 6 Minute Walk    Row Name 02/02/17 1531 05/18/17 1525       6 Minute Walk   Phase  Discharge  Initial    Distance  1700 feet  1276 feet    Distance Feet Change  153 ft  -    Walk Time  6  minutes  6 minutes    # of Rest Breaks  0  0    MPH  3.21  2.4    METS  3.45  2.8    RPE  14  17    Perceived Dyspnea   4  3    Symptoms  No  Yes (comment)    Comments  -  SOB    Resting HR  91 bpm  80 bpm    Resting BP  134/72  140/81    Resting Oxygen Saturation   98 %  98 %    Exercise Oxygen Saturation  during 6 min walk  0 %  91 %    Max Ex. HR  153 bpm  115 bpm    Max Ex. BP  180/82  156/92    2 Minute Post BP  144/70  142/84      Interval HR   1 Minute HR  131  -    2 Minute HR  132  -    3 Minute HR  139  -    4 Minute HR  133  -    5 Minute HR  153  -    6 Minute HR  153  -    2 Minute Post HR  101  -    Interval Heart Rate?  Yes  -      Interval Oxygen   Interval Oxygen?  Yes  -    Baseline Oxygen Saturation %  98 %  -    1 Minute Oxygen Saturation %  97 %  -    1 Minute Liters of Oxygen  0 L  -    2 Minute Oxygen Saturation %  94 %  -    2 Minute Liters of Oxygen  0 L  -    3 Minute Oxygen Saturation %  93 %  -    3 Minute Liters of Oxygen  0 L  -    4 Minute Oxygen Saturation %  91 %  -    4 Minute Liters of Oxygen  0 L  -    5 Minute Oxygen Saturation %  90 %  -    5 Minute Liters of Oxygen  0 L  -    6 Minute Oxygen Saturation %  90 %  -    6 Minute Liters of Oxygen  0 L  -    2 Minute Post Oxygen Saturation %  98 %  -    2 Minute Post Liters of Oxygen  0 L  -       Oxygen Initial Assessment:   Oxygen Re-Evaluation: Oxygen Re-Evaluation    Row Name 01/06/17 2129 01/29/17 1326           Program Oxygen Prescription   Program Oxygen Prescription  None  None        Home Oxygen   Home Oxygen Device  None  None      Sleep Oxygen Prescription  None  None      Home Exercise Oxygen Prescription  None  None      Home at Rest Exercise Oxygen Prescription  None  None         Oxygen Discharge (Final Oxygen Re-Evaluation): Oxygen Re-Evaluation - 01/29/17 1326      Program Oxygen Prescription   Program Oxygen Prescription  None      Home Oxygen    Home Oxygen Device  None    Sleep Oxygen Prescription  None    Home Exercise Oxygen Prescription  None    Home at Rest Exercise Oxygen Prescription  None       Initial Exercise Prescription: Initial Exercise Prescription - 05/18/17 1500      Date of Initial Exercise RX and Referring  Provider   Date  05/18/17    Referring Provider  Martinique, Peter MD      NuStep   Level  2    Minutes  15    METs  2      Track   Laps  10    Minutes  15    METs  2.16      Prescription Details   Frequency (times per week)  3    Duration  Progress to 30 minutes of continuous aerobic without signs/symptoms of physical distress      Intensity   THRR 40-80% of Max Heartrate  61-122    Ratings of Perceived Exertion  11-15    Perceived Dyspnea  0-4      Progression   Progression  Continue to progress workloads to maintain intensity without signs/symptoms of physical distress.      Resistance Training   Training Prescription  Yes    Weight  3lbs    Reps  10-15       Perform Capillary Blood Glucose checks as needed.  Exercise Prescription Changes: Exercise Prescription Changes    Row Name 12/08/16 1500 12/10/16 1556 12/15/16 1500 12/17/16 1606 12/22/16 1500     Response to Exercise   Blood Pressure (Admit)  134/78  136/64  160/84  142/70  140/76   Blood Pressure (Exercise)  170/80  126/74  186/82  140/70  154/80   Blood Pressure (Exit)  122/60  120/66  128/72  118/70  122/62   Heart Rate (Admit)  80 bpm  79 bpm  80 bpm  95 bpm  82 bpm   Heart Rate (Exercise)  118 bpm  109 bpm  122 bpm  101 bpm  121 bpm   Heart Rate (Exit)  97 bpm  82 bpm  95 bpm  89 bpm  88 bpm   Oxygen Saturation (Admit)  96 %  97 %  97 %  96 %  96 %   Oxygen Saturation (Exercise)  93 %  93 %  87 %  96 %  93 %   Oxygen Saturation (Exit)  96 %  95 %  96 %  95 %  95 %   Rating of Perceived Exertion (Exercise)  13  12  13  12  12    Perceived Dyspnea (Exercise)  2  2  3  2  2    Duration  Continue with 45 min of aerobic  exercise without signs/symptoms of physical distress.  Continue with 45 min of aerobic exercise without signs/symptoms of physical distress.  Continue with 45 min of aerobic exercise without signs/symptoms of physical distress.  Continue with 45 min of aerobic exercise without signs/symptoms of physical distress.  Continue with 45 min of aerobic exercise without signs/symptoms of physical distress.   Intensity  THRR unchanged  THRR unchanged  THRR unchanged  THRR unchanged  THRR unchanged     Progression   Progression  Continue to progress workloads to maintain intensity without signs/symptoms of physical distress.  Continue to progress workloads to maintain intensity without signs/symptoms of physical distress.  Continue to progress workloads to maintain intensity without signs/symptoms of physical distress.  Continue to progress workloads to maintain intensity without signs/symptoms of physical distress.  Continue to progress workloads to maintain intensity without signs/symptoms of physical distress.     Resistance Training   Training Prescription  Yes  Yes  Yes  Yes  Yes   Weight  blue bands  blue bands  blue bands  blue bands  blue bands   Reps  10-15  10-15  10-15  10-15  10-15   Time  10 Minutes  10 Minutes  10 Minutes  10 Minutes  10 Minutes     Interval Training   Interval Training  No  No  No  No  No     NuStep   Level  7  7  7  7  7    Minutes  17  17  17  17  17    METs  3.4  3.4  3.4  4.4  5.1     Arm/Foot Ergometer   Level  -  7  -  -  -   Minutes  -  17  -  -  -   METs  -  3.1  -  -  -     Rower   Level  4  -  4  4  4    Minutes  17  -  17  17  17      Track   Laps  18  9  21   -  17   Minutes  17  17  17   -  17   Row Name 12/24/16 1500 12/31/16 1500 01/05/17 1608 01/07/17 1603 01/12/17 1533     Response to Exercise   Blood Pressure (Admit)  146/70  138/66  126/60  120/66  130/60   Blood Pressure (Exercise)  144/70  174/82  162/82  128/58  128/58   Blood Pressure (Exit)   110/60  128/74  134/68  118/68  118/68   Heart Rate (Admit)  84 bpm  83 bpm  80 bpm  83 bpm  83 bpm   Heart Rate (Exercise)  132 bpm  115 bpm  125 bpm  103 bpm  103 bpm   Heart Rate (Exit)  99 bpm  83 bpm  96 bpm  84 bpm  84 bpm   Oxygen Saturation (Admit)  96 %  95 %  96 %  96 %  96 %   Oxygen Saturation (Exercise)  91 %  89 %  90 %  94 %  91 %   Oxygen Saturation (Exit)  94 %  94 %  95 %  95 %  96 %   Rating of Perceived Exertion (Exercise)  12  12  13  12  12    Perceived Dyspnea (Exercise)  3  2  3  2  1    Duration  Continue with 45 min of aerobic exercise without signs/symptoms of physical distress.  Continue with 45 min of aerobic exercise without signs/symptoms of physical distress.  Continue with 45 min of aerobic exercise without signs/symptoms of physical distress.  Continue with 45 min of aerobic exercise without signs/symptoms of physical distress.  Continue with 45 min of aerobic exercise without signs/symptoms of physical distress.   Intensity  THRR unchanged  THRR unchanged  THRR unchanged  THRR unchanged  THRR unchanged     Progression   Progression  Continue to progress workloads to maintain intensity without signs/symptoms of physical distress.  Continue to progress workloads to maintain intensity without signs/symptoms of physical distress.  Continue to progress workloads to maintain intensity without signs/symptoms of physical distress.  Continue to progress workloads to maintain intensity without signs/symptoms of physical distress.  Continue to progress workloads to maintain intensity without signs/symptoms of physical distress.     Resistance Training   Training Prescription  Yes  Yes  Yes  Yes  Yes   Weight  blue bands  blue bands  blue bands  blue bands  blue bands   Reps  10-15  10-15  10-15  10-15  10-15   Time  10 Minutes  10 Minutes  10 Minutes  10 Minutes  10 Minutes     Interval Training   Interval Training  No  No  No  No  No     NuStep   Level  7  7  7  7  7     Minutes  17  17  17  17  17    METs  3.3  3.7  3.8  3.5  3.8     Track   Laps  35  16  39  22  40   Minutes  34  17  [place] Name 01/14/17 1500 01/19/17 1531 01/21/17 1521 01/26/17 1618 01/28/17 1622     Response to Exercise   Blood Pressure (Admit)  150/68  120/66  140/52  120/74  138/64   Blood Pressure (Exercise)  150/80  140/80  130/70  132/72  154/76   Blood Pressure (Exit)  120/78  122/72  124/64  122/64  120/70   Heart Rate (Admit)  79 bpm  76 bpm  77 bpm  67 bpm  89 bpm   Heart Rate (Exercise)  113 bpm  107 bpm  124 bpm  114 bpm  134 bpm   Heart Rate (Exit)  93 bpm  85 bpm  88 bpm  90 bpm  91 bpm   Oxygen Saturation (Admit)  96 %  96 %  95 %  96 %  97 %   Oxygen Saturation (Exercise)  93 %  93 %  90 %  91 %  91 %   Oxygen Saturation (Exit)  96 %  95 %  95 %  94 %  94 %   Rating of Perceived Exertion (Exercise)  12  12  13  13  13    Perceived Dyspnea (Exercise)  3  3  3  3  2    Duration  Continue with 45 min of aerobic exercise without signs/symptoms of physical distress.  Continue with 45 min of aerobic exercise without signs/symptoms of physical distress.  Continue with 45 min of aerobic exercise without signs/symptoms of physical distress.  Continue with 45 min of aerobic exercise without signs/symptoms of physical distress.  Continue with 45 min of aerobic exercise without signs/symptoms of physical distress.   Intensity  THRR unchanged  THRR unchanged  THRR unchanged  THRR unchanged  THRR unchanged     Progression   Progression  Continue to progress workloads to maintain intensity without signs/symptoms of physical distress.  Continue to progress workloads to maintain intensity without signs/symptoms of physical distress.  Continue to progress workloads to maintain intensity without signs/symptoms of physical distress.  Continue to progress workloads to maintain intensity without signs/symptoms of physical distress.  Continue to progress workloads to maintain intensity  without signs/symptoms of physical distress.     Resistance Training   Training Prescription  Yes  Yes  Yes  Yes  Yes   Weight  blue bands  blue bands  blue bands  blue bands  blue bands   Reps  10-15  10-15  10-15  10-15  10-15   Time  10 Minutes  10 Minutes  10 Minutes  10 Minutes  10 Minutes  Interval Training   Interval Training  No  No  No  No  No     NuStep   Level  7  -  7  7  7    Minutes  17  -  17  17  17    METs  5.2  -  3.4  3.4  4.1     Rower   Level  21  6  -  4  -   Minutes  17  17  -  17  -     Track   Laps  -  18  35  19  21   Minutes  -  88  50  27  74   JOI Name 05/24/17 1600 05/28/17 1018           Response to Exercise   Blood Pressure (Admit)  142/90  128/80      Blood Pressure (Exercise)  160/80  128/64      Blood Pressure (Exit)  108/70  138/82      Heart Rate (Admit)  91 bpm  85 bpm      Heart Rate (Exercise)  116 bpm  110 bpm      Heart Rate (Exit)  89 bpm  79 bpm      Oxygen Saturation (Admit)  97 %  96 %      Oxygen Saturation (Exercise)  97 %  94 %      Oxygen Saturation (Exit)  98 %  98 %      Rating of Perceived Exertion (Exercise)  13  13      Perceived Dyspnea (Exercise)  3  3      Duration  Continue with 30 min of aerobic exercise without signs/symptoms of physical distress.  Continue with 30 min of aerobic exercise without signs/symptoms of physical distress.      Intensity  THRR unchanged  THRR unchanged        Progression   Progression  Continue to progress workloads to maintain intensity without signs/symptoms of physical distress.  Continue to progress workloads to maintain intensity without signs/symptoms of physical distress.      Average METs  2.7  3.1        Resistance Training   Training Prescription  Yes  Yes      Weight  3lbs  3lbs      Reps  10-15  10-15      Time  10 Minutes  10 Minutes        Interval Training   Interval Training  No  No        NuStep   Level  2  4      Minutes  15  15      METs  2.9  3.5         Track   Laps  14  15      Minutes  15  15      METs  2.51  2.74         Exercise Comments: Exercise Comments    Row Name 05/24/17 1617           Exercise Comments  Pt oriented to exercise equipment today. Pt responded well to exercise session; will continue to monitor and progress pt's activity levels as tolerated.           Exercise Goals and Review: Exercise Goals    Row Name 05/18/17 1359  Exercise Goals   Increase Physical Activity  Yes       Intervention  Provide advice, education, support and counseling about physical activity/exercise needs.;Develop an individualized exercise prescription for aerobic and resistive training based on initial evaluation findings, risk stratification, comorbidities and participant's personal goals.       Expected Outcomes  Achievement of increased cardiorespiratory fitness and enhanced flexibility, muscular endurance and strength shown through measurements of functional capacity and personal statement of participant.       Increase Strength and Stamina  Yes       Intervention  Provide advice, education, support and counseling about physical activity/exercise needs.;Develop an individualized exercise prescription for aerobic and resistive training based on initial evaluation findings, risk stratification, comorbidities and participant's personal goals.       Expected Outcomes  Achievement of increased cardiorespiratory fitness and enhanced flexibility, muscular endurance and strength shown through measurements of functional capacity and personal statement of participant.       Able to understand and use rate of perceived exertion (RPE) scale  Yes       Intervention  Provide education and explanation on how to use RPE scale       Expected Outcomes  Short Term: Able to use RPE daily in rehab to express subjective intensity level;Long Term:  Able to use RPE to guide intensity level when exercising independently       Knowledge and  understanding of Target Heart Rate Range (THRR)  Yes       Intervention  Provide education and explanation of THRR including how the numbers were predicted and where they are located for reference       Expected Outcomes  Short Term: Able to state/look up THRR;Long Term: Able to use THRR to govern intensity when exercising independently;Short Term: Able to use daily as guideline for intensity in rehab       Able to check pulse independently  Yes       Intervention  Provide education and demonstration on how to check pulse in carotid and radial arteries.;Review the importance of being able to check your own pulse for safety during independent exercise       Expected Outcomes  Short Term: Able to explain why pulse checking is important during independent exercise;Long Term: Able to check pulse independently and accurately       Understanding of Exercise Prescription  Yes       Intervention  Provide education, explanation, and written materials on patient's individual exercise prescription       Expected Outcomes  Short Term: Able to explain program exercise prescription;Long Term: Able to explain home exercise prescription to exercise independently          Exercise Goals Re-Evaluation : Exercise Goals Re-Evaluation    Row Name 01/05/17 0951 01/25/17 1412 06/02/17 1108 06/02/17 1516       Exercise Goal Re-Evaluation   Exercise Goals Review  Increase Strength and Stamina;Increase Physical Activity;Able to understand and use Dyspnea scale;Able to understand and use rate of perceived exertion (RPE) scale;Knowledge and understanding of Target Heart Rate Range (THRR);Understanding of Exercise Prescription  Increase Strength and Stamina;Increase Physical Activity;Able to understand and use Dyspnea scale;Able to understand and use rate of perceived exertion (RPE) scale;Knowledge and understanding of Target Heart Rate Range (THRR);Understanding of Exercise Prescription  Increase Strength and Stamina;Increase  Physical Activity;Able to understand and use Dyspnea scale;Able to understand and use rate of perceived exertion (RPE) scale;Knowledge and understanding of Target Heart Rate Range (THRR);Understanding  of Exercise Prescription  Increase Strength and Stamina;Increase Physical Activity;Able to understand and use Dyspnea scale;Able to understand and use rate of perceived exertion (RPE) scale;Knowledge and understanding of Target Heart Rate Range (THRR);Understanding of Exercise Prescription    Comments  Patient is progressing well in program. Comes to each rehab session motivated to work hard. Struggles with shortness of breath. Walks up to 16 laps (200 feet each) in 15 minutes.  Home exercise completed. Will cont. to monitor and progress as able.   Patient is progressing well in program. Comes to each rehab session motivated to work hard. Struggles with shortness of breath. Walks up to 16 laps (200 feet each) in 15 minutes.  Home exercise completed. Will cont. to monitor and progress as able.   Patient is progressing well in program. Comes to each rehab session motivated to work hard. Pt is often limited by shortness of breath and over exertion. Expressed the importance of short/intermittent rest breaks. Walks up to 15 laps (200 feet each) in 15 minutes.   -    Expected Outcomes  Through exercising at rehab and at home, patient will increase physical strength and stamina and find ADL's easier to perform.   Through exercising at rehab and at home, patient will increase physical strength and stamina and find ADL's easier to perform.   Pt will continue to improve in CR fitness and aerobic capacity while understanding limitations   -        Discharge Exercise Prescription (Final Exercise Prescription Changes): Exercise Prescription Changes - 05/28/17 1018      Response to Exercise   Blood Pressure (Admit)  128/80    Blood Pressure (Exercise)  128/64    Blood Pressure (Exit)  138/82    Heart Rate (Admit)  85  bpm    Heart Rate (Exercise)  110 bpm    Heart Rate (Exit)  79 bpm    Oxygen Saturation (Admit)  96 %    Oxygen Saturation (Exercise)  94 %    Oxygen Saturation (Exit)  98 %    Rating of Perceived Exertion (Exercise)  13    Perceived Dyspnea (Exercise)  3    Duration  Continue with 30 min of aerobic exercise without signs/symptoms of physical distress.    Intensity  THRR unchanged      Progression   Progression  Continue to progress workloads to maintain intensity without signs/symptoms of physical distress.    Average METs  3.1      Resistance Training   Training Prescription  Yes    Weight  3lbs    Reps  10-15    Time  10 Minutes      Interval Training   Interval Training  No      NuStep   Level  4    Minutes  15    METs  3.5      Track   Laps  15    Minutes  15    METs  2.74       Nutrition:  Target Goals: Understanding of nutrition guidelines, daily intake of sodium <1558m, cholesterol <2029m calories 30% from fat and 7% or less from saturated fats, daily to have 5 or more servings of fruits and vegetables.  Biometrics: Pre Biometrics - 05/18/17 1531      Pre Biometrics   Height  5' 7"  (1.702 m)    Weight  225 lb 5 oz (102.2 kg)    Waist Circumference  46 inches  Hip Circumference  46.5 inches    Waist to Hip Ratio  0.99 %    BMI (Calculated)  35.28    Triceps Skinfold  15 mm    % Body Fat  33.4 %    Grip Strength  36 kg    Flexibility  13 in    Single Leg Stand  30 seconds      Post Biometrics - 02/02/17 1535       Post  Biometrics   Grip Strength  30 kg       Nutrition Therapy Plan and Nutrition Goals: Nutrition Therapy & Goals - 05/19/17 0913      Nutrition Therapy   Diet  Carb Modified, Heart Healthy      Personal Nutrition Goals   Nutrition Goal  Pt to be able to name foods that affect blood sugar.     Personal Goal #2  Pt to identify food quantities necessary to achieve weight loss of 6-14 lb  at graduation from cardiac rehab. Goal  wt of 210 lb desired.       Intervention Plan   Intervention  Prescribe, educate and counsel regarding individualized specific dietary modifications aiming towards targeted core components such as weight, hypertension, lipid management, diabetes, heart failure and other comorbidities.    Expected Outcomes  Short Term Goal: Understand basic principles of dietary content, such as calories, fat, sodium, cholesterol and nutrients.;Long Term Goal: Adherence to prescribed nutrition plan.       Nutrition Assessments: Nutrition Assessments - 05/19/17 0913      MEDFICTS Scores   Pre Score  29       Nutrition Goals Re-Evaluation: Nutrition Goals Re-Evaluation    [place] Name 02/05/17 1114             Goals   Current Weight  101 lb 1.6 oz (45.9 kg)       Nutrition Goal  Pt to be able to name foods that affect blood sugar.        Comment  Pt is making healthier food choices (including desserts). Pt has maintained his wt.          Personal Goal #2 Re-Evaluation   Personal Goal #2  Pt to maintain his wt around 220 lb until pt is ready to focus on wt loss.          Personal Goal #3 Re-Evaluation   Personal Goal #3  Describe the benefit of including fruits, vegetables, whole grains, and low-fat dairy products in a healthy meal plan.         Personal Goal #4 Re-Evaluation   Personal Goal #4  Pt to increase frequency of food consumed to at least a small snack (e.g. 1/4 cup nuts, Greek yogurt) every 5 hours.          Nutrition Goals Re-Evaluation: Nutrition Goals Re-Evaluation    [place] Name 02/05/17 1114             Goals   Current Weight  101 lb 1.6 oz (45.9 kg)       Nutrition Goal  Pt to be able to name foods that affect blood sugar.        Comment  Pt is making healthier food choices (including desserts). Pt has maintained his wt.          Personal Goal #2 Re-Evaluation   Personal Goal #2  Pt to maintain his wt around 220 lb until pt is ready to focus on wt loss.  Personal  Goal #3 Re-Evaluation   Personal Goal #3  Describe the benefit of including fruits, vegetables, whole grains, and low-fat dairy products in a healthy meal plan.         Personal Goal #4 Re-Evaluation   Personal Goal #4  Pt to increase frequency of food consumed to at least a small snack (e.g. 1/4 cup nuts, Greek yogurt) every 5 hours.          Nutrition Goals Discharge (Final Nutrition Goals Re-Evaluation): Nutrition Goals Re-Evaluation - 02/05/17 1114      Goals   Current Weight  101 lb 1.6 oz (45.9 kg)    Nutrition Goal  Pt to be able to name foods that affect blood sugar.     Comment  Pt is making healthier food choices (including desserts). Pt has maintained his wt.       Personal Goal #2 Re-Evaluation   Personal Goal #2  Pt to maintain his wt around 220 lb until pt is ready to focus on wt loss.       Personal Goal #3 Re-Evaluation   Personal Goal #3  Describe the benefit of including fruits, vegetables, whole grains, and low-fat dairy products in a healthy meal plan.      Personal Goal #4 Re-Evaluation   Personal Goal #4  Pt to increase frequency of food consumed to at least a small snack (e.g. 1/4 cup nuts, Greek yogurt) every 5 hours.       Psychosocial: Target Goals: Acknowledge presence or absence of significant depression and/or stress, maximize coping skills, provide positive support system. Participant is able to verbalize types and ability to use techniques and skills needed for reducing stress and depression.  Initial Review & Psychosocial Screening: Initial Psych Review & Screening - 05/18/17 1408      Initial Review   Current issues with  None Identified    Source of Stress Concerns  None Identified      Family Dynamics   Good Support System?  Yes spouse    Comments  no psychoosocial needs identified, no interventions necessary       Barriers   Psychosocial barriers to participate in program  There are no identifiable barriers or psychosocial needs.       Screening Interventions   Interventions  Encouraged to exercise       Quality of Life Scores: Quality of Life - 05/28/17 1626      Quality of Life Scores   Health/Function Pre  17.23 % pt main concern is dyspnea on exertion. DOE mostly present with stairclimbing.      Socioeconomic Pre  25.44 %    Psych/Spiritual Pre  24 %    Family Pre  27.6 %    GLOBAL Pre  21.94 %      Scores of 19 and below usually indicate a poorer quality of life in these areas.  A difference of  2-3 points is a clinically meaningful difference.  A difference of 2-3 points in the total score of the Quality of Life Index has been associated with significant improvement in overall quality of life, self-image, physical symptoms, and general health in studies assessing change in quality of life.  PHQ-9: Recent Review Flowsheet Data    Depression screen [facility] 2/9 05/24/2017 02/02/2017 10/19/2016 07/29/2016 07/27/2016   Decreased Interest 0 0 1 0 0   Down, Depressed, Hopeless 1 0 3 0 0   PHQ - 2 Score 1 0 4 0 0   Altered sleeping - -  3 - -   Tired, decreased energy - - 3 - -   Change in appetite - - 3 - -   Feeling bad or failure about yourself  - - 0 - -   Trouble concentrating - - 0 - -   Moving slowly or fidgety/restless - - 0 - -   Suicidal thoughts - - 0 - -   PHQ-9 Score - - 13 - -   Difficult doing work/chores - - Not difficult at all - -     Interpretation of Total Score  Total Score Depression Severity:  1-4 = Minimal depression, 5-9 = Mild depression, 10-14 = Moderate depression, 15-19 = Moderately severe depression, 20-27 = Severe depression   Psychosocial Evaluation and Intervention: Psychosocial Evaluation - 05/24/17 1538      Psychosocial Evaluation & Interventions   Interventions  Encouraged to exercise with the program and follow exercise prescription    Comments  pt with health related anxiety. pt has feels overwhelmed from progressive medical conditions this year. pt looking forward to regain  strength/stamina to get back to normal life.    Continue Psychosocial Services   Follow up required by staff       Psychosocial Re-Evaluation: Psychosocial Re-Evaluation    [place] Name 12/08/16 1624 12/08/16 1625 01/06/17 2130 01/29/17 1328 05/28/17 1630     Psychosocial Re-Evaluation   Current issues with  -  Current Stress Concerns  Current Stress Concerns  None Identified  None Identified   Comments  patient is discouraged with his continue shortness of breath but is beginning to accept that he may need valve surgery inorder for it to improv  -  patient has accepted the need for valve surgery. has appointment with cardiologist september 5 to discuss options  -  pt demonstrates eagerness to participate in CR program to increase functional ability.     Interventions  -  Encouraged to attend Pulmonary Rehabilitation for the exercise encouraged patient to re-evaluate need for valve surgery with cardiothoracic surgeon  Encouraged to attend Pulmonary Rehabilitation for the exercise  Encouraged to attend Pulmonary Rehabilitation for the exercise  Encouraged to attend Cardiac Rehabilitation for the exercise   Continue Psychosocial Services   -  Follow up required by staff  Follow up required by staff  Follow up required by staff  Follow up required by staff   Comments  -  if his shortness of breath is related to his valve then pulmonary rehab may not improve his shortness of breath as he anticipated.  if his shortness of breath is related to his valve then pulmonary rehab may not improve his shortness of breath as he anticipated.  -  -     Initial Review   Source of Stress Concerns  -  Unable to participate in former interests or hobbies;Unable to perform yard/household activities  Unable to participate in former interests or hobbies;Unable to perform yard/household activities  -  -      Psychosocial Discharge (Final Psychosocial Re-Evaluation): Psychosocial Re-Evaluation - 05/28/17 1630       Psychosocial Re-Evaluation   Current issues with  None Identified    Comments  pt demonstrates eagerness to participate in CR program to increase functional ability.      Interventions  Encouraged to attend Cardiac Rehabilitation for the exercise    Continue Psychosocial Services   Follow up required by staff       Vocational Rehabilitation: Provide vocational rehab assistance to qualifying candidates.   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 Participant will state understanding/return demonstration of topics presented.  Learning Barriers/Preferences: Learning Barriers/Preferences - 05/18/17 1355      Learning Barriers/Preferences   Learning Barriers  Sight    Learning Preferences  Audio;Computer/Internet;Pictoral;Skilled Demonstration;Verbal Instruction;Video;Written Material;Individual Instruction;Group Instruction       Education Topics: Count Your Pulse:  -Group instruction provided by verbal instruction, demonstration, patient participation and written materials to support subject.  Instructors address importance of being able to find your pulse and how to count your pulse when at home without a heart monitor.  Patients get hands on experience counting their pulse with staff help and individually.   Heart Attack, Angina, and Risk Factor Modification:  -Group instruction provided by verbal instruction, video, and written materials to support subject.  Instructors address signs and symptoms of angina and heart attacks.    Also discuss risk factors for heart disease and how to make changes to improve heart health risk factors.   Functional Fitness:  -Group instruction provided by verbal  instruction, demonstration, patient participation, and written materials to support subject.  Instructors address safety measures for doing things around the house.  Discuss how to get up and down off the floor, how to pick things up properly, how to safely get out of a chair without assistance, and balance training.   Meditation and Mindfulness:  -Group instruction provided by verbal instruction, patient participation, and written materials to support subject.  Instructor addresses importance of mindfulness and meditation practice to help reduce stress and improve awareness.  Instructor also leads participants through a meditation exercise.    CARDIAC REHAB PHASE II EXERCISE from 05/28/2017 in [place]  Date  12/10/16  Educator  Mable Fill [Chapl[place]]  Instruction Review Code  2- meets goals/outcomes      Stretching for Flexibility and Mobility:  -Group instruction provided by verbal instruction, patient participation, and written materials to support subject.  Instructors lead participants through series of stretches that are designed to increase flexibility thus improving mobility.  These stretches are additional exercise for major muscle groups that are typically performed during regular warm up and cool down.   Hands Only CPR:  -Group verbal, video, and participation provides a basic overview of AHA guidelines for community CPR. Role-play of emergencies allow participants the opportunity to practice calling for help and chest compression technique with discussion of AED use.   Hypertension: -Group verbal and written instruction that provides a basic overview of hypertension including the most recent diagnostic guidelines, risk factor reduction with self-care instructions and medication management.    Nutrition I class: Heart Healthy Eating:  -Group instruction provided by PowerPoint slides, verbal discussion, and written materials to support subject matter.  The instructor gives an explanation and review of the Therapeutic Lifestyle Changes diet recommendations, which includes a discussion on lipid goals, dietary fat, sodium, fiber, plant stanol/sterol esters, sugar, and the components of a well-balanced, healthy diet.   CARDIAC REHAB PHASE II EXERCISE from 05/28/2017 in [place]  Date  05/18/17  Educator  RD  Instruction Review Code  Not applicable      Nutrition II class: Lifestyle Skills:  -Group instruction provided by  PowerPoint slides, verbal discussion, and written materials to support subject matter. The instructor gives an explanation and review of label reading, grocery shopping for heart health, heart healthy recipe modifications, and ways to make healthier choices when eating out.   CARDIAC REHAB PHASE II EXERCISE from 05/28/2017 in [place]  Date  05/18/17  Educator  RD  Instruction Review Code  Not applicable      Diabetes Question & Answer:  -Group instruction provided by PowerPoint slides, verbal discussion, and written materials to support subject matter. The instructor gives an explanation and review of diabetes co-morbidities, pre- and post-prandial blood glucose goals, pre-exercise blood glucose goals, signs, symptoms, and treatment of hypoglycemia and hyperglycemia, and foot care basics.   CARDIAC REHAB PHASE II EXERCISE from 05/28/2017 in [place]  Date  05/28/17  Educator  RD  Instruction Review Code  2- meets goals/outcomes      Diabetes Blitz:  -Group instruction provided by PowerPoint slides, verbal discussion, and written materials to support subject matter. The instructor gives an explanation and review of the physiology behind type 1 and type 2 diabetes, diabetes medications and rational behind using different medications, pre- and post-prandial blood glucose recommendations and Hemoglobin A1c goals, diabetes diet,  and exercise including blood glucose guidelines for exercising safely.    Portion Distortion:  -Group instruction provided by PowerPoint slides, verbal discussion, written materials, and food models to support subject matter. The instructor gives an explanation of serving size versus portion size, changes in portions sizes over the last 20 years, and what consists of a serving from each food group.   Stress Management:  -Group instruction provided by verbal instruction, video, and written materials to support subject matter.  Instructors review role of stress in heart disease and how to cope with stress positively.     Exercising on Your Own:  -Group instruction provided by verbal instruction, power point, and written materials to support subject.  Instructors discuss benefits of exercise, components of exercise, frequency and intensity of exercise, and end points for exercise.  Also discuss use of nitroglycerin and activating EMS.  Review options of places to exercise outside of rehab.  Review guidelines for sex with heart disease.   CARDIAC REHAB PHASE II EXERCISE from 05/28/2017 in [place]  Date  05/26/17  Educator  EP  Instruction Review Code  2- meets goals/outcomes      Cardiac Drugs I:  -Group instruction provided by verbal instruction and written materials to support subject.  Instructor reviews cardiac drug classes: antiplatelets, anticoagulants, beta blockers, and statins.  Instructor discusses reasons, side effects, and lifestyle considerations for each drug class.   Cardiac Drugs II:  -Group instruction provided by verbal instruction and written materials to support subject.  Instructor reviews cardiac drug classes: angiotensin converting enzyme inhibitors (ACE-I), angiotensin II receptor blockers (ARBs), nitrates, and calcium channel blockers.  Instructor discusses reasons, side effects, and lifestyle considerations for each drug  class.   Anatomy and Physiology of the Circulatory System:  Group verbal and written instruction and models provide basic cardiac anatomy and physiology, with the coronary electrical and arterial systems. Review of: AMI, Angina, Valve disease, Heart Failure, Peripheral Artery Disease, Cardiac Arrhythmia, Pacemakers, and the ICD.   Other Education:  -Group or individual verbal, written, or video instructions that support the educational goals of the cardiac rehab program.   Knowledge Questionnaire Score: Knowledge Questionnaire Score - 05/18/17 1407  Knowledge Questionnaire Score   Pre Score  24/24       Core Components/Risk Factors/Patient Goals at Admission: Personal Goals and Risk Factors at Admission - 05/18/17 1403      Core Components/Risk Factors/Patient Goals on Admission    Weight Management  Obesity    Improve shortness of breath with ADL's  Yes    Intervention  Provide education, individualized exercise plan and daily activity instruction to help decrease symptoms of SOB with activities of daily living.    Expected Outcomes  Short Term: Achieves a reduction of symptoms when performing activities of daily living.    Develop more efficient breathing techniques such as purse lipped breathing and diaphragmatic breathing; and practicing self-pacing with activity  Yes    Intervention  Provide education, demonstration and support about specific breathing techniuqes utilized for more efficient breathing. Include techniques such as pursed lipped breathing, diaphragmatic breathing and self-pacing activity.    Expected Outcomes  Short Term: Participant will be able to demonstrate and use breathing techniques as needed throughout daily activities.    Diabetes  Yes    Intervention  Provide education about signs/symptoms and action to take for hypo/hyperglycemia.;Provide education about proper nutrition, including hydration, and aerobic/resistive exercise prescription along with  prescribed medications to achieve blood glucose in normal ranges: Fasting glucose 65-99 mg/dL    Expected Outcomes  Short Term: Participant verbalizes understanding of the signs/symptoms and immediate care of hyper/hypoglycemia, proper foot care and importance of medication, aerobic/resistive exercise and nutrition plan for blood glucose control.;Long Term: Attainment of HbA1C < 7%.    Heart Failure  Yes    Intervention  Provide a combined exercise and nutrition program that is supplemented with education, support and counseling about heart failure. Directed toward relieving symptoms such as shortness of breath, decreased exercise tolerance, and extremity edema.    Expected Outcomes  Improve functional capacity of life;Short term: Attendance in program 2-3 days a week with increased exercise capacity. Reported lower sodium intake. Reported increased fruit and vegetable intake. Reports medication compliance.;Short term: Daily weights obtained and reported for increase. Utilizing diuretic protocols set by physician.;Long term: Adoption of self-care skills and reduction of barriers for early signs and symptoms recognition and intervention leading to self-care maintenance.    Lipids  Yes    Intervention  Provide education and support for participant on nutrition & aerobic/resistive exercise along with prescribed medications to achieve LDL <11m, HDL >463m    Expected Outcomes  Short Term: Participant states understanding of desired cholesterol values and is compliant with medications prescribed. Participant is following exercise prescription and nutrition guidelines.;Long Term: Cholesterol controlled with medications as prescribed, with individualized exercise RX and with personalized nutrition plan. Value goals: LDL < 7043mHDL > 40 mg.       Core Components/Risk Factors/Patient Goals Review:  Goals and Risk Factor Review    Row Name 12/08/16 1617 01/06/17 2129 01/29/17 1326 02/22/17 0854 05/24/17 1534      Core Components/Risk Factors/Patient Goals Review   Personal Goals Review  Weight Management/Obesity;Improve shortness of breath with ADL's;Increase knowledge of respiratory medications and ability to use respiratory devices properly.;Develop more efficient breathing techniques such as purse lipped breathing and diaphragmatic breathing and practicing self-pacing with activity.  Weight Management/Obesity;Improve shortness of breath with ADL's;Increase knowledge of respiratory medications and ability to use respiratory devices properly.;Develop more efficient breathing techniques such as purse lipped breathing and diaphragmatic breathing and practicing self-pacing with activity.  Weight Management/Obesity;Improve shortness of breath with ADL's;Increase knowledge of  respiratory medications and ability to use respiratory devices properly.;Develop more efficient breathing techniques such as purse lipped breathing and diaphragmatic breathing and practicing self-pacing with activity.  -  Weight Management/Obesity;Diabetes;Heart Failure   Review  patient has progressed well over the past 30 days in pulmonary rehab. he has tolerated workload increases although is limited by his shortness of breath. This may not be related to any pulmonary illness but to his need for a valve replacement. he is utilizing PLB when appropriate. He is beginning to ask question regarding the effects of a round abdomen on breathing and has realized that if he can loose some weight his breathing may improve somewhat.   patient has progressed well over the past 30 days in pulmonary rehab. he has tolerated workload increases although is limited by his shortness of breath. This may not be related to any pulmonary illness but to his need for a valve replacement. he is utilizing PLB when appropriate. He is beginning to ask question regarding the effects of a round abdomen on breathing and has realized that if he can loose some weight his breathing  may improve somewhat.   patient continues to progress well in pulmonary rehab. He is able to walk 21 laps in 15 min which is almost a mile. His shortness of breath is not improving and may be getting worse with exertions however this is valve related and unfortunately will not improve with rehab. his cardiologist gave him clearence to continue to push himself at rehab.  patient met all pulmonary rehab goals at discharge except shortness of breath. His shortness of breath is assumend to be related to a heart valve and not a lung problem.   pt with multiple CAD RF demonstrates eagerness to participate in CR program.  pt newly diagnosed diet controlled diabetic. pt instructed to participate in Diabetes Q&A education opporutunity.     Expected Outcomes  expect to see more progression of goals in the next 30 days  expect to see more progression of goals in the next 30 days  expect to see more progression of goals in the next 30 days  -  pt will participate in CR exercise, nutrition and education opportunities to improve overall RF.     [place] Name 05/28/17 1629             Core Components/Risk Factors/Patient Goals Review   Personal Goals Review  Weight Management/Obesity;Diabetes;Heart Failure       Review  pt with multiple CAD RF demonstrates eagerness to participate in CR program.  pt pleased he is able to walk up 14 steps without dyspnea and is hoping to further increase funcational ability. pt newly diagnosed diet controlled diabetic. pt participated in Diabetes Q&A education opporutunity.         Expected Outcomes  pt will participate in CR exercise, nutrition and education opportunities to improve overall RF.            Core Components/Risk Factors/Patient Goals at Discharge (Final Review):  Goals and Risk Factor Review - 05/28/17 1629      Core Components/Risk Factors/Patient Goals Review   Personal Goals Review  Weight Management/Obesity;Diabetes;Heart Failure    Review  pt with multiple CAD RF  demonstrates eagerness to participate in CR program.  pt pleased he is able to walk up 14 steps without dyspnea and is hoping to further increase funcational ability. pt newly diagnosed diet controlled diabetic. pt participated in Diabetes Q&A education opporutunity.      Expected Outcomes  pt will participate in CR exercise, nutrition and education opportunities to improve overall RF.         ITP Comments: ITP Comments    Row Name 05/18/17 1352 05/24/17 1532 06/02/17 1101       ITP Comments  Medical Director- Dr. Fransico Him, MD.  pt started group exercise program.  pt demonstrates eagerness to participate in CR program.   30 day ITP review.  pt demonstrates eagerness to participate in CR progrm.         Comments:

## 2017-06-04 ENCOUNTER — Encounter (HOSPITAL_COMMUNITY): Payer: Medicare Other

## 2017-06-04 ENCOUNTER — Encounter (HOSPITAL_COMMUNITY)
Admission: RE | Admit: 2017-06-04 | Discharge: 2017-06-04 | Disposition: A | Discharge status: Home / Self Care | Payer: Medicare Other | Source: Ambulatory Visit | Attending: Cardiology | Admitting: Cardiology

## 2017-06-04 DIAGNOSIS — Z9889 Other specified postprocedural states: Secondary | ICD-10-CM

## 2017-06-04 DIAGNOSIS — C3491 Malignant neoplasm of unspecified part of right bronchus or lung: Secondary | ICD-10-CM | POA: Diagnosis not present

## 2017-06-04 DIAGNOSIS — Z951 Presence of aortocoronary bypass graft: Secondary | ICD-10-CM

## 2017-06-07 ENCOUNTER — Encounter (HOSPITAL_COMMUNITY): Payer: Medicare Other

## 2017-06-07 ENCOUNTER — Encounter (HOSPITAL_COMMUNITY)
Admission: RE | Admit: 2017-06-07 | Discharge: 2017-06-07 | Disposition: A | Discharge status: Home / Self Care | Payer: Medicare Other | Source: Ambulatory Visit | Attending: Cardiology | Admitting: Cardiology

## 2017-06-07 DIAGNOSIS — Z951 Presence of aortocoronary bypass graft: Secondary | ICD-10-CM

## 2017-06-07 DIAGNOSIS — Z9889 Other specified postprocedural states: Secondary | ICD-10-CM

## 2017-06-07 DIAGNOSIS — C3491 Malignant neoplasm of unspecified part of right bronchus or lung: Secondary | ICD-10-CM | POA: Diagnosis not present

## 2017-06-09 ENCOUNTER — Encounter (HOSPITAL_COMMUNITY)
Admission: RE | Admit: 2017-06-09 | Discharge: 2017-06-09 | Disposition: A | Discharge status: Home / Self Care | Payer: Medicare Other | Source: Ambulatory Visit | Attending: Cardiology | Admitting: Cardiology

## 2017-06-09 ENCOUNTER — Encounter (HOSPITAL_COMMUNITY): Payer: Medicare Other

## 2017-06-09 DIAGNOSIS — Z951 Presence of aortocoronary bypass graft: Secondary | ICD-10-CM

## 2017-06-09 DIAGNOSIS — C3491 Malignant neoplasm of unspecified part of right bronchus or lung: Secondary | ICD-10-CM | POA: Diagnosis not present

## 2017-06-09 DIAGNOSIS — Z9889 Other specified postprocedural states: Secondary | ICD-10-CM

## 2017-06-10 ENCOUNTER — Ambulatory Visit (HOSPITAL_COMMUNITY): Payer: Medicare Other | Attending: Cardiology

## 2017-06-10 ENCOUNTER — Other Ambulatory Visit: Payer: Self-pay

## 2017-06-10 DIAGNOSIS — Z951 Presence of aortocoronary bypass graft: Secondary | ICD-10-CM | POA: Insufficient documentation

## 2017-06-10 DIAGNOSIS — I313 Pericardial effusion (noninflammatory): Secondary | ICD-10-CM | POA: Insufficient documentation

## 2017-06-10 DIAGNOSIS — I2699 Other pulmonary embolism without acute cor pulmonale: Secondary | ICD-10-CM | POA: Diagnosis not present

## 2017-06-10 DIAGNOSIS — E785 Hyperlipidemia, unspecified: Secondary | ICD-10-CM | POA: Insufficient documentation

## 2017-06-10 DIAGNOSIS — I5032 Chronic diastolic (congestive) heart failure: Secondary | ICD-10-CM | POA: Diagnosis present

## 2017-06-10 DIAGNOSIS — Z9889 Other specified postprocedural states: Secondary | ICD-10-CM

## 2017-06-10 LAB — ECHOCARDIOGRAM COMPLETE
E decel time: 412 msec
FS: 36 % (ref 28–44)
IVS/LV PW RATIO, ED: 0.83
LA ID, A-P, ES: 39 mm
LA diam end sys: 39 mm
LA diam index: 1.8 cm/m2
LA vol A4C: 71 ml
LA vol index: 39.6 mL/m2
LA vol: 86 mL
LV PW d: 13.8 mm — AB (ref 0.6–1.1)
LVOT area: 3.14 cm2
LVOT diameter: 20 mm
MV Dec: 412
MV Peak grad: 10 mmHg
MV pk A vel: 160 m/s
MV pk E vel: 159 m/s

## 2017-06-11 ENCOUNTER — Encounter (HOSPITAL_COMMUNITY): Payer: Medicare Other

## 2017-06-11 ENCOUNTER — Encounter (HOSPITAL_COMMUNITY)
Admission: RE | Admit: 2017-06-11 | Discharge: 2017-06-11 | Disposition: A | Discharge status: Home / Self Care | Payer: Medicare Other | Source: Ambulatory Visit | Attending: Cardiology | Admitting: Cardiology

## 2017-06-11 DIAGNOSIS — Z7982 Long term (current) use of aspirin: Secondary | ICD-10-CM | POA: Diagnosis not present

## 2017-06-11 DIAGNOSIS — F419 Anxiety disorder, unspecified: Secondary | ICD-10-CM | POA: Diagnosis not present

## 2017-06-11 DIAGNOSIS — Z9889 Other specified postprocedural states: Secondary | ICD-10-CM | POA: Diagnosis present

## 2017-06-11 DIAGNOSIS — Z951 Presence of aortocoronary bypass graft: Secondary | ICD-10-CM | POA: Insufficient documentation

## 2017-06-11 DIAGNOSIS — G4733 Obstructive sleep apnea (adult) (pediatric): Secondary | ICD-10-CM | POA: Diagnosis not present

## 2017-06-11 DIAGNOSIS — I5032 Chronic diastolic (congestive) heart failure: Secondary | ICD-10-CM | POA: Diagnosis not present

## 2017-06-11 DIAGNOSIS — I110 Hypertensive heart disease with heart failure: Secondary | ICD-10-CM | POA: Diagnosis not present

## 2017-06-11 DIAGNOSIS — Z79899 Other long term (current) drug therapy: Secondary | ICD-10-CM | POA: Diagnosis not present

## 2017-06-11 DIAGNOSIS — F329 Major depressive disorder, single episode, unspecified: Secondary | ICD-10-CM | POA: Diagnosis not present

## 2017-06-11 DIAGNOSIS — Z7901 Long term (current) use of anticoagulants: Secondary | ICD-10-CM | POA: Insufficient documentation

## 2017-06-11 DIAGNOSIS — C3491 Malignant neoplasm of unspecified part of right bronchus or lung: Secondary | ICD-10-CM | POA: Diagnosis not present

## 2017-06-11 DIAGNOSIS — M1990 Unspecified osteoarthritis, unspecified site: Secondary | ICD-10-CM | POA: Insufficient documentation

## 2017-06-11 DIAGNOSIS — K219 Gastro-esophageal reflux disease without esophagitis: Secondary | ICD-10-CM | POA: Insufficient documentation

## 2017-06-11 DIAGNOSIS — E119 Type 2 diabetes mellitus without complications: Secondary | ICD-10-CM | POA: Diagnosis not present

## 2017-06-11 DIAGNOSIS — E669 Obesity, unspecified: Secondary | ICD-10-CM | POA: Insufficient documentation

## 2017-06-11 DIAGNOSIS — I739 Peripheral vascular disease, unspecified: Secondary | ICD-10-CM | POA: Diagnosis not present

## 2017-06-11 DIAGNOSIS — I2510 Atherosclerotic heart disease of native coronary artery without angina pectoris: Secondary | ICD-10-CM | POA: Diagnosis not present

## 2017-06-14 ENCOUNTER — Encounter (HOSPITAL_COMMUNITY): Payer: Medicare Other

## 2017-06-14 ENCOUNTER — Encounter (HOSPITAL_COMMUNITY)
Admission: RE | Admit: 2017-06-14 | Discharge: 2017-06-14 | Disposition: A | Discharge status: Home / Self Care | Payer: Medicare Other | Source: Ambulatory Visit | Attending: Cardiology | Admitting: Cardiology

## 2017-06-14 DIAGNOSIS — C3491 Malignant neoplasm of unspecified part of right bronchus or lung: Secondary | ICD-10-CM | POA: Diagnosis not present

## 2017-06-14 DIAGNOSIS — Z9889 Other specified postprocedural states: Secondary | ICD-10-CM

## 2017-06-14 DIAGNOSIS — Z951 Presence of aortocoronary bypass graft: Secondary | ICD-10-CM

## 2017-06-15 ENCOUNTER — Other Ambulatory Visit: Payer: Self-pay | Admitting: Physician Assistant

## 2017-06-16 ENCOUNTER — Encounter (HOSPITAL_COMMUNITY)
Admission: RE | Admit: 2017-06-16 | Discharge: 2017-06-16 | Disposition: A | Discharge status: Home / Self Care | Payer: Medicare Other | Source: Ambulatory Visit | Attending: Cardiology | Admitting: Cardiology

## 2017-06-16 ENCOUNTER — Encounter (HOSPITAL_COMMUNITY): Payer: Medicare Other

## 2017-06-16 DIAGNOSIS — Z951 Presence of aortocoronary bypass graft: Secondary | ICD-10-CM

## 2017-06-16 DIAGNOSIS — C3491 Malignant neoplasm of unspecified part of right bronchus or lung: Secondary | ICD-10-CM | POA: Diagnosis not present

## 2017-06-16 DIAGNOSIS — Z9889 Other specified postprocedural states: Secondary | ICD-10-CM

## 2017-06-16 NOTE — Progress Notes (Signed)
James [name] 67 y.o. male DOB: Jul 30, 1950 MRN: [id number]      Nutrition Note  1. S/P MVR (mitral valve repair)   2. S/P CABG x 1    Labs:  Lipid Panel     Component Value Date/Time   CHOL 177 05/27/2017 0807   TRIG 128 05/27/2017 0807   TRIG 108 05/20/2006 0825   HDL 41 05/27/2017 0807   CHOLHDL 4.3 05/27/2017 0807   CHOLHDL 7 06/29/2016 1153   VLDL 39.6 06/29/2016 1153   LDLCALC 110 (H) 05/27/2017 0807   LDLDIRECT 177.0 07/24/2015 1052   Lab Results  Component Value Date   HGBA1C 6.5 (H) 03/22/2017   Nutrition Note Spoke with pt. Nutrition plan and survey reviewed with pt. Pt is following a Heart Healthy diet. Pt wants to lose wt. Pt feels portion control is the biggest barrier to wt loss. Wt loss tips reviewed. Pt's A1c discussed again. Pt expressed understanding of the information reviewed. Pt aware of nutrition education classes offered and plans on attending nutrition classes.  Nutrition Diagnosis ? Food-and nutrition-related knowledge deficit related to lack of exposure to information as related to diagnosis of: ? CVD ? DM  ? Obesity related to excessive energy intake as evidenced by a BMI of 33.1  Nutrition Intervention ? Pt's individual nutrition plan and goals reviewed with pt. ? Benefits of adopting Heart Healthy diet discussed when Medficts reviewed.  Nutrition Goal(s):  ? Pt to identify food quantities necessary to achieve weight loss of 6-14 lb  at graduation from cardiac rehab. Goal wt of 210 lb desired.  ? Pt able to name foods that affect blood glucose   Plan:  Pt to attend nutrition classes ? Nutrition I ? Nutrition II ? Portion Distortion - met 06/09/17 ? Diabetes Blitz ? Diabetes Q & A - met 05/28/17 Will provide client-centered nutrition education as part of interdisciplinary care.   Monitor and evaluate progress toward nutrition goal with team.  Derek Mound, M.Ed, RD, LDN, CDE 06/16/2017 2:38 PM

## 2017-06-18 ENCOUNTER — Encounter (HOSPITAL_COMMUNITY): Payer: Medicare Other

## 2017-06-18 ENCOUNTER — Encounter (HOSPITAL_COMMUNITY)
Admission: RE | Admit: 2017-06-18 | Discharge: 2017-06-18 | Disposition: A | Discharge status: Home / Self Care | Payer: Medicare Other | Source: Ambulatory Visit | Attending: Cardiology | Admitting: Cardiology

## 2017-06-18 DIAGNOSIS — C3491 Malignant neoplasm of unspecified part of right bronchus or lung: Secondary | ICD-10-CM | POA: Diagnosis not present

## 2017-06-18 DIAGNOSIS — Z951 Presence of aortocoronary bypass graft: Secondary | ICD-10-CM

## 2017-06-18 DIAGNOSIS — Z9889 Other specified postprocedural states: Secondary | ICD-10-CM

## 2017-06-21 ENCOUNTER — Encounter (HOSPITAL_COMMUNITY): Payer: Medicare Other

## 2017-06-21 ENCOUNTER — Encounter (HOSPITAL_COMMUNITY)
Admission: RE | Admit: 2017-06-21 | Discharge: 2017-06-21 | Disposition: A | Discharge status: Home / Self Care | Payer: Medicare Other | Source: Ambulatory Visit | Attending: Cardiology | Admitting: Cardiology

## 2017-06-21 DIAGNOSIS — C3491 Malignant neoplasm of unspecified part of right bronchus or lung: Secondary | ICD-10-CM | POA: Diagnosis not present

## 2017-06-21 DIAGNOSIS — Z9889 Other specified postprocedural states: Secondary | ICD-10-CM

## 2017-06-21 DIAGNOSIS — Z951 Presence of aortocoronary bypass graft: Secondary | ICD-10-CM

## 2017-06-23 ENCOUNTER — Encounter (HOSPITAL_COMMUNITY)
Admission: RE | Admit: 2017-06-23 | Discharge: 2017-06-23 | Disposition: A | Discharge status: Home / Self Care | Payer: Medicare Other | Source: Ambulatory Visit | Attending: Cardiology | Admitting: Cardiology

## 2017-06-23 ENCOUNTER — Encounter (HOSPITAL_COMMUNITY): Payer: Medicare Other

## 2017-06-23 DIAGNOSIS — Z9889 Other specified postprocedural states: Secondary | ICD-10-CM

## 2017-06-23 DIAGNOSIS — C3491 Malignant neoplasm of unspecified part of right bronchus or lung: Secondary | ICD-10-CM | POA: Diagnosis not present

## 2017-06-23 DIAGNOSIS — Z951 Presence of aortocoronary bypass graft: Secondary | ICD-10-CM

## 2017-06-25 ENCOUNTER — Other Ambulatory Visit: Payer: Self-pay | Admitting: Physician Assistant

## 2017-06-25 ENCOUNTER — Encounter (HOSPITAL_COMMUNITY): Payer: Medicare Other

## 2017-06-25 ENCOUNTER — Encounter (HOSPITAL_COMMUNITY)
Admission: RE | Admit: 2017-06-25 | Discharge: 2017-06-25 | Disposition: A | Discharge status: Home / Self Care | Payer: Medicare Other | Source: Ambulatory Visit | Attending: Cardiology | Admitting: Cardiology

## 2017-06-25 DIAGNOSIS — Z951 Presence of aortocoronary bypass graft: Secondary | ICD-10-CM

## 2017-06-25 DIAGNOSIS — Z9889 Other specified postprocedural states: Secondary | ICD-10-CM

## 2017-06-25 DIAGNOSIS — C3491 Malignant neoplasm of unspecified part of right bronchus or lung: Secondary | ICD-10-CM | POA: Diagnosis not present

## 2017-06-28 ENCOUNTER — Encounter (HOSPITAL_COMMUNITY)
Admission: RE | Admit: 2017-06-28 | Discharge: 2017-06-28 | Disposition: A | Discharge status: Home / Self Care | Payer: Medicare Other | Source: Ambulatory Visit | Attending: Cardiology | Admitting: Cardiology

## 2017-06-28 ENCOUNTER — Encounter (HOSPITAL_COMMUNITY): Payer: Medicare Other

## 2017-06-28 DIAGNOSIS — Z951 Presence of aortocoronary bypass graft: Secondary | ICD-10-CM

## 2017-06-28 DIAGNOSIS — C3491 Malignant neoplasm of unspecified part of right bronchus or lung: Secondary | ICD-10-CM | POA: Diagnosis not present

## 2017-06-28 DIAGNOSIS — Z9889 Other specified postprocedural states: Secondary | ICD-10-CM

## 2017-06-29 ENCOUNTER — Other Ambulatory Visit: Payer: Self-pay | Admitting: Cardiology

## 2017-06-29 NOTE — Telephone Encounter (Signed)
Rx(s) sent to pharmacy electronically.  

## 2017-06-30 ENCOUNTER — Encounter (HOSPITAL_COMMUNITY): Payer: Medicare Other

## 2017-06-30 ENCOUNTER — Telehealth (HOSPITAL_COMMUNITY): Payer: Self-pay | Admitting: *Deleted

## 2017-07-01 NOTE — Progress Notes (Signed)
Cardiac Individual Treatment Plan  Patient Details  Name: James [name] MRN: [id number] Date of Birth: October 17, 1950 Referring Provider:     [facility] from 05/18/2017 in [place]  Referring Provider  Martinique, Peter MD      Initial Encounter Date:    CARDIAC REHAB PHASE II ORIENTATION from 05/18/2017 in [place]  Date  05/18/17  Referring Provider  Martinique, Peter MD      Visit Diagnosis: S/P MVR (mitral valve repair)  S/P CABG x 1  Patient's Home Medications on Admission:  Current Outpatient Medications:  .  acetaminophen (TYLENOL) 325 MG tablet, Take 650 mg by mouth every 6 (six) hours as needed for moderate pain or headache., Disp: , Rfl:  .  aspirin EC 81 MG tablet, Take 1 tablet (81 mg total) by mouth daily., Disp: 90 tablet, Rfl: 3 .  atorvastatin (LIPITOR) 20 MG tablet, Take 1 tablet (20 mg total) by mouth daily., Disp: 90 tablet, Rfl: 3 .  furosemide (LASIX) 40 MG tablet, Take 1 tablet (40 mg total) by mouth daily as needed., Disp: 90 tablet, Rfl: 0 .  metoprolol tartrate (LOPRESSOR) 25 MG tablet, TAKE 1/2 TABLET BY MOUTH 2 TIMES DAILY, Disp: 30 tablet, Rfl: 11 .  Omega-3 Fatty Acids (FISH OIL) 500 MG CAPS, Take 500 mg by mouth daily. , Disp: , Rfl:  .  potassium chloride SA (K-DUR,KLOR-CON) 20 MEQ tablet, Take 1 tablet (20 mEq total) by mouth daily as needed. With lasix, Disp: 90 tablet, Rfl: 0 .  psyllium (METAMUCIL) 58.6 % packet, Take 1 packet by mouth daily., Disp: , Rfl:  .  sodium chloride (OCEAN) 0.65 % SOLN nasal spray, Place 2 sprays into both nostrils at bedtime., Disp: , Rfl:  .  Tetrahydrozoline-Zn Sulfate (ALLERGY RELIEF EYE DROPS OP), Apply 2 drops to eye as needed (for allergies). , Disp: , Rfl:  .  traMADol (ULTRAM) 50 MG tablet, Take 1 tablet (50 mg total) by mouth every 6 (six) hours as needed (may take one or two tablets every six hrs prn)., Disp: 30 tablet, Rfl: 0 .   Undecylenic Ac-Zn Undecylenate (FUNGI-NAIL TOE & FOOT EX), Apply 1 application topically daily as needed (for nail). , Disp: , Rfl:   Past Medical History: Past Medical History:  Diagnosis Date  . Adenocarcinoma of right lung, stage 1 ([place]) 09/06/2016  . Anxiety   . Arthritis    "knees, hips, back" (10/19/2012)  . Chronic diastolic congestive heart failure ([place])   . Chronic lower back pain   . Colon polyps    10/27/2002, repeat letter 09/17/2007  . Coronary artery disease   . Coronary artery disease involving native coronary artery of native heart without angina pectoris   . Depressive disorder, not elsewhere classified    no meds  . Diabetes mellitus without complication ([place])    diet controlled- no med  (while in hosp 4/18 -elevated cbg  . Dyspnea   . Fasting hyperglycemia   . GERD (gastroesophageal reflux disease)   . Heart murmur   . Hemoptysis    abnormal CT Chest 01/29/10 - ? new GG changes RUL > not viz on plain cxr 02/26/2010  . Hypertension   . Mitral regurgitation    severe MR 08/2016  . MPN (myeloproliferative neoplasm) ([place])    1st detected 06/04/1998  . Obesity   . OSA on CPAP    last sleep study 10 years ago  . Other  and unspecified hyperlipidemia   . Peripheral vascular disease ([place]) 08/2016   after lung surgey small clots in lungs,after hip dvt-5/16  . Pneumonia    4/18  . Positive PPD 1965   "non reactive in 2012" (10/19/2012)  . Routine general medical examination at a health care facility   . S/P CABG x 1 03/24/2017   LIMA to LAD  . S/P mitral valve repair 03/24/2017   Complex valvuloplasty including artificial Gore-tex neochord placement x6 and 28 mm Sorin Annuloflex posterior annuloplasty band  . Special screening for malignant neoplasm of prostate   . Spinal stenosis, unspecified region other than cervical   . Wrist pain, left     Tobacco Use: Social History   Tobacco Use  Smoking Status Never Smoker  Smokeless Tobacco Never Used    Labs: Recent  Review Flowsheet Data    Labs for ITP Cardiac and Pulmonary Rehab Latest Ref Rng & Units 03/24/2017 03/24/2017 03/24/2017 03/25/2017 05/27/2017   Cholestrol 100 - 199 mg/dL - - - - 177   LDLCALC 0 - 99 mg/dL - - - - 110(H)   LDLDIRECT mg/dL - - - - -   HDL >39 mg/dL - - - - 41   Trlycerides 0 - 149 mg/dL - - - - 128   Hemoglobin A1c 4.8 - 5.6 % - - - - -   PHART 7.350 - 7.450 7.353 7.369 - - -   PCO2ART 32.0 - 48.0 mmHg 38.9 38.1 - - -   HCO3 20.0 - 28.0 mmol/L 21.8 22.0 - - -   TCO2 22 - 32 mmol/L 23 23 21(L) 21(L) -   ACIDBASEDEF 0.0 - 2.0 mmol/L 4.0(H) 3.0(H) - - -   O2SAT % 99.0 98.0 - - -      Capillary Blood Glucose: Lab Results  Component Value Date   GLUCAP 89 03/29/2017   GLUCAP 103 (H) 03/28/2017   GLUCAP 131 (H) 03/28/2017   GLUCAP 112 (H) 03/28/2017   GLUCAP 89 03/28/2017     Exercise Target Goals:    Exercise Program Goal: Individual exercise prescription set using results from initial 6 min walk test and THRR while considering  patient's activity barriers and safety.   Exercise Prescription Goal: Initial exercise prescription builds to 30-45 minutes a day of aerobic activity, 2-3 days per week.  Home exercise guidelines will be given to patient during program as part of exercise prescription that the participant will acknowledge.  Activity Barriers & Risk Stratification: Activity Barriers & Cardiac Risk Stratification - 05/18/17 1355      Activity Barriers & Cardiac Risk Stratification   Activity Barriers  Joint Problems;Deconditioning;Muscular Weakness;Shortness of Breath;Incisional Pain;Other (comment);Left Hip Replacement    Comments  L knee pain ( possible surgery), L wrist surgery in July of 2011    Cardiac Risk Stratification  High       6 Minute Walk: 6 Minute Walk    Row Name 02/02/17 1531 05/18/17 1525       6 Minute Walk   Phase  Discharge  Initial    Distance  1700 feet  1276 feet    Distance Feet Change  153 ft  -    Walk Time  6  minutes  6 minutes    # of Rest Breaks  0  0    MPH  3.21  2.4    METS  3.45  2.8    RPE  14  17    Perceived Dyspnea   4  3    Symptoms  No  Yes (comment)    Comments  -  SOB    Resting HR  91 bpm  80 bpm    Resting BP  134/72  140/81    Resting Oxygen Saturation   98 %  98 %    Exercise Oxygen Saturation  during 6 min walk  0 %  91 %    Max Ex. HR  153 bpm  115 bpm    Max Ex. BP  180/82  156/92    2 Minute Post BP  144/70  142/84      Interval HR   1 Minute HR  131  -    2 Minute HR  132  -    3 Minute HR  139  -    4 Minute HR  133  -    5 Minute HR  153  -    6 Minute HR  153  -    2 Minute Post HR  101  -    Interval Heart Rate?  Yes  -      Interval Oxygen   Interval Oxygen?  Yes  -    Baseline Oxygen Saturation %  98 %  -    1 Minute Oxygen Saturation %  97 %  -    1 Minute Liters of Oxygen  0 L  -    2 Minute Oxygen Saturation %  94 %  -    2 Minute Liters of Oxygen  0 L  -    3 Minute Oxygen Saturation %  93 %  -    3 Minute Liters of Oxygen  0 L  -    4 Minute Oxygen Saturation %  91 %  -    4 Minute Liters of Oxygen  0 L  -    5 Minute Oxygen Saturation %  90 %  -    5 Minute Liters of Oxygen  0 L  -    6 Minute Oxygen Saturation %  90 %  -    6 Minute Liters of Oxygen  0 L  -    2 Minute Post Oxygen Saturation %  98 %  -    2 Minute Post Liters of Oxygen  0 L  -       Oxygen Initial Assessment:   Oxygen Re-Evaluation: Oxygen Re-Evaluation    Row Name 01/06/17 2129 01/29/17 1326           Program Oxygen Prescription   Program Oxygen Prescription  None  None        Home Oxygen   Home Oxygen Device  None  None      Sleep Oxygen Prescription  None  None      Home Exercise Oxygen Prescription  None  None      Home at Rest Exercise Oxygen Prescription  None  None         Oxygen Discharge (Final Oxygen Re-Evaluation): Oxygen Re-Evaluation - 01/29/17 1326      Program Oxygen Prescription   Program Oxygen Prescription  None      Home Oxygen    Home Oxygen Device  None    Sleep Oxygen Prescription  None    Home Exercise Oxygen Prescription  None    Home at Rest Exercise Oxygen Prescription  None       Initial Exercise Prescription: Initial Exercise Prescription - 05/18/17 1500      Date  of Initial Exercise RX and Referring Provider   Date  05/18/17    Referring Provider  Martinique, Peter MD      NuStep   Level  2    Minutes  15    METs  2      Track   Laps  10    Minutes  15    METs  2.16      Prescription Details   Frequency (times per week)  3    Duration  Progress to 30 minutes of continuous aerobic without signs/symptoms of physical distress      Intensity   THRR 40-80% of Max Heartrate  61-122    Ratings of Perceived Exertion  11-15    Perceived Dyspnea  0-4      Progression   Progression  Continue to progress workloads to maintain intensity without signs/symptoms of physical distress.      Resistance Training   Training Prescription  Yes    Weight  3lbs    Reps  10-15       Perform Capillary Blood Glucose checks as needed.  Exercise Prescription Changes: Exercise Prescription Changes    Row Name 01/05/17 1608 01/07/17 1603 01/12/17 1533 01/14/17 1500 01/19/17 1531     Response to Exercise   Blood Pressure (Admit)  126/60  120/66  130/60  150/68  120/66   Blood Pressure (Exercise)  162/82  128/58  128/58  150/80  140/80   Blood Pressure (Exit)  134/68  118/68  118/68  120/78  122/72   Heart Rate (Admit)  80 bpm  83 bpm  83 bpm  79 bpm  76 bpm   Heart Rate (Exercise)  125 bpm  103 bpm  103 bpm  113 bpm  107 bpm   Heart Rate (Exit)  96 bpm  84 bpm  84 bpm  93 bpm  85 bpm   Oxygen Saturation (Admit)  96 %  96 %  96 %  96 %  96 %   Oxygen Saturation (Exercise)  90 %  94 %  91 %  93 %  93 %   Oxygen Saturation (Exit)  95 %  95 %  96 %  96 %  95 %   Rating of Perceived Exertion (Exercise)  _0 Perceived Dyspnea (Exercise)  _1 Duration  Continue with 45 min of aerobic  exercise without signs/symptoms of physical distress.  Continue with 45 min of aerobic exercise without signs/symptoms of physical distress.  Continue with 45 min of aerobic exercise without signs/symptoms of physical distress.  Continue with 45 min of aerobic exercise without signs/symptoms of physical distress.  Continue with 45 min of aerobic exercise without signs/symptoms of physical distress.   Intensity  THRR unchanged  THRR unchanged  THRR unchanged  THRR unchanged  THRR unchanged     Progression   Progression  Continue to progress workloads to maintain intensity without signs/symptoms of physical distress.  Continue to progress workloads to maintain intensity without signs/symptoms of physical distress.  Continue to progress workloads to maintain intensity without signs/symptoms of physical distress.  Continue to progress workloads to maintain intensity without signs/symptoms of physical distress.  Continue to progress workloads to maintain intensity without signs/symptoms of physical distress.     Resistance Training   Training Prescription  Yes  Yes  Yes  Yes  Yes   Weight  blue bands  blue bands  blue bands  blue bands  blue bands   Reps  10-15  10-15  10-15  10-15  10-15   Time  10 Minutes  10 Minutes  10 Minutes  10 Minutes  10 Minutes     Interval Training   Interval Training  No  No  No  No  No     NuStep   Level  _0 -   Minutes  _1 -   METs  3.8  3.5  3.8  5.2  -     Rower   Level  -  -  -  21  6   Minutes  -  -  -  17  17     Track   Laps  80  22  40  -  18   Minutes  [place] Name 01/21/17 1521 01/26/17 1618 01/28/17 1622 05/24/17 1600 05/28/17 1018     Response to Exercise   Blood Pressure (Admit)  140/52  120/74  138/64  142/90  128/80   Blood Pressure (Exercise)  130/70  132/72  154/76  160/80  128/64   Blood Pressure (Exit)  124/64  122/64  120/70  108/70  138/82   Heart Rate (Admit)  77 bpm  67 bpm  89 bpm  91 bpm  85 bpm    Heart Rate (Exercise)  124 bpm  114 bpm  134 bpm  116 bpm  110 bpm   Heart Rate (Exit)  88 bpm  90 bpm  91 bpm  89 bpm  79 bpm   Oxygen Saturation (Admit)  95 %  96 %  97 %  97 %  96 %   Oxygen Saturation (Exercise)  90 %  91 %  91 %  97 %  94 %   Oxygen Saturation (Exit)  95 %  94 %  94 %  98 %  98 %   Rating of Perceived Exertion (Exercise)  _2 Perceived Dyspnea (Exercise)  _3 Duration  Continue with 45 min of aerobic exercise without signs/symptoms of physical distress.  Continue with 45 min of aerobic exercise without signs/symptoms of physical distress.  Continue with 45 min of aerobic exercise without signs/symptoms of physical distress.  Continue with 30 min of aerobic exercise without signs/symptoms of physical distress.  Continue with 30 min of aerobic exercise without signs/symptoms of physical distress.   Intensity  THRR unchanged  THRR unchanged  THRR unchanged  THRR unchanged  THRR unchanged     Progression   Progression  Continue to progress workloads to maintain intensity without signs/symptoms of physical distress.  Continue to progress workloads to maintain intensity without signs/symptoms of physical distress.  Continue to progress workloads to maintain intensity without signs/symptoms of physical distress.  Continue to progress workloads to maintain intensity without signs/symptoms of physical distress.  Continue to progress workloads to maintain intensity without signs/symptoms of physical distress.   Average METs  -  -  -  2.7  3.1     Resistance Training   Training Prescription  Yes  Yes  Yes  Yes  Yes   Weight  blue bands  blue bands  blue bands  3lbs  3lbs  Reps  10-15  10-15  10-15  10-15  10-15   Time  10 Minutes  10 Minutes  10 Minutes  10 Minutes  10 Minutes     Interval Training   Interval Training  No  No  No  No  No     NuStep   Level  _0 Minutes  _1 METs  3.4  3.4  4.1  2.9  3.5     Rower    Level  -  4  -  -  -   Minutes  -  17  -  -  -     Track   Laps  35  _2 Minutes  34  _3 METs  -  -  -  2.51  2.74   Row Name 06/09/17 1600 06/14/17 1647 06/28/17 1154         Response to Exercise   Blood Pressure (Admit)  142/84  124/70  126/74     Blood Pressure (Exercise)  152/72  150/70  160/70     Blood Pressure (Exit)  138/80  148/70  110/68     Heart Rate (Admit)  88 bpm  88 bpm  81 bpm     Heart Rate (Exercise)  127 bpm  131 bpm  127 bpm     Heart Rate (Exit)  94 bpm  92 bpm  88 bpm     Oxygen Saturation (Admit)  96 %  95 %  96 %     Oxygen Saturation (Exercise)  93 %  89 % increased to 93 with PLB  97 % increased to 93 with PLB     Oxygen Saturation (Exit)  96 %  94 %  96 %     Rating of Perceived Exertion (Exercise)  _4 Perceived Dyspnea (Exercise)  _5 Duration  Continue with 30 min of aerobic exercise without signs/symptoms of physical distress.  Continue with 30 min of aerobic exercise without signs/symptoms of physical distress.  Continue with 30 min of aerobic exercise without signs/symptoms of physical distress.     Intensity  THRR unchanged  THRR unchanged  THRR unchanged       Progression   Progression  Continue to progress workloads to maintain intensity without signs/symptoms of physical distress.  Continue to progress workloads to maintain intensity without signs/symptoms of physical distress.  Continue to progress workloads to maintain intensity without signs/symptoms of physical distress.     Average METs  2.8  3.4  3.7       Resistance Training   Training Prescription  No relaxation day  Yes relaxation day  Yes relaxation day     Weight  -  3lbs  4lbs     Reps  -  10-15  10-15     Time  -  10 Minutes  10 Minutes       Interval Training   Interval Training  No  No  No       NuStep   Level  _6 Minutes  _7 METs  3.4  3.8  4  Rower   Level  -  -  3     Watts  -  -  31      Minutes  -  -  10     METs  -  -  4.2       Track   Laps  _0 Minutes  _1 METs  2.28  2.97  2.39       Home Exercise Plan   Plans to continue exercise at  -  Home (comment)  Home (comment)     Frequency  -  Add 2 additional days to program exercise sessions.  Add 2 additional days to program exercise sessions.     Initial Home Exercises Provided  -  06/02/17  06/02/17        Exercise Comments: Exercise Comments    Row Name 05/24/17 1617 06/25/17 1209         Exercise Comments  Pt oriented to exercise equipment today. Pt responded well to exercise session; will continue to monitor and progress pt's activity levels as tolerated.   Reviewed METs and goals. Pt is tolerating Ex Rx well; often limited due to symptoms of SOB, encouraged rest breaks. Pt continues to exercise safely and is constantly reminded of activity limitations and warning signs.          Exercise Goals and Review: Exercise Goals    Row Name 05/18/17 1359             Exercise Goals   Increase Physical Activity  Yes       Intervention  Provide advice, education, support and counseling about physical activity/exercise needs.;Develop an individualized exercise prescription for aerobic and resistive training based on initial evaluation findings, risk stratification, comorbidities and participant's personal goals.       Expected Outcomes  Achievement of increased cardiorespiratory fitness and enhanced flexibility, muscular endurance and strength shown through measurements of functional capacity and personal statement of participant.       Increase Strength and Stamina  Yes       Intervention  Provide advice, education, support and counseling about physical activity/exercise needs.;Develop an individualized exercise prescription for aerobic and resistive training based on initial evaluation findings, risk stratification, comorbidities and participant's personal goals.       Expected Outcomes   Achievement of increased cardiorespiratory fitness and enhanced flexibility, muscular endurance and strength shown through measurements of functional capacity and personal statement of participant.       Able to understand and use rate of perceived exertion (RPE) scale  Yes       Intervention  Provide education and explanation on how to use RPE scale       Expected Outcomes  Short Term: Able to use RPE daily in rehab to express subjective intensity level;Long Term:  Able to use RPE to guide intensity level when exercising independently       Knowledge and understanding of Target Heart Rate Range (THRR)  Yes       Intervention  Provide education and explanation of THRR including how the numbers were predicted and where they are located for reference       Expected Outcomes  Short Term: Able to state/look up THRR;Long Term: Able to use THRR to govern intensity when exercising independently;Short Term: Able to use daily as guideline for intensity in rehab       Able to check pulse independently  Yes       Intervention  Provide education and demonstration on how to check pulse in carotid and radial arteries.;Review the importance of being able to check your own pulse for safety during independent exercise       Expected Outcomes  Short Term: Able to explain why pulse checking is important during independent exercise;Long Term: Able to check pulse independently and accurately       Understanding of Exercise Prescription  Yes       Intervention  Provide education, explanation, and written materials on patient's individual exercise prescription       Expected Outcomes  Short Term: Able to explain program exercise prescription;Long Term: Able to explain home exercise prescription to exercise independently          Exercise Goals Re-Evaluation : Exercise Goals Re-Evaluation    Row Name 01/05/17 0951 01/25/17 1412 06/02/17 1108 06/02/17 1516 06/25/17 1210     Exercise Goal Re-Evaluation   Exercise Goals  Review  Increase Strength and Stamina;Increase Physical Activity;Able to understand and use Dyspnea scale;Able to understand and use rate of perceived exertion (RPE) scale;Knowledge and understanding of Target Heart Rate Range (THRR);Understanding of Exercise Prescription  Increase Strength and Stamina;Increase Physical Activity;Able to understand and use Dyspnea scale;Able to understand and use rate of perceived exertion (RPE) scale;Knowledge and understanding of Target Heart Rate Range (THRR);Understanding of Exercise Prescription  Increase Strength and Stamina;Increase Physical Activity;Able to understand and use Dyspnea scale;Able to understand and use rate of perceived exertion (RPE) scale;Knowledge and understanding of Target Heart Rate Range (THRR);Understanding of Exercise Prescription  Increase Strength and Stamina;Increase Physical Activity;Able to understand and use Dyspnea scale;Able to understand and use rate of perceived exertion (RPE) scale;Knowledge and understanding of Target Heart Rate Range (THRR);Understanding of Exercise Prescription  Increase Strength and Stamina;Increase Physical Activity;Able to understand and use Dyspnea scale;Able to understand and use rate of perceived exertion (RPE) scale;Knowledge and understanding of Target Heart Rate Range (THRR);Understanding of Exercise Prescription   Comments  Patient is progressing well in program. Comes to each rehab session motivated to work hard. Struggles with shortness of breath. Walks up to 16 laps (200 feet each) in 15 minutes.  Home exercise completed. Will cont. to monitor and progress as able.   Patient is progressing well in program. Comes to each rehab session motivated to work hard. Struggles with shortness of breath. Walks up to 16 laps (200 feet each) in 15 minutes.  Home exercise completed. Will cont. to monitor and progress as able.   Patient is progressing well in program. Comes to each rehab session motivated to work hard. Pt is  often limited by shortness of breath and over exertion. Expressed the importance of short/intermittent rest breaks. Walks up to 15 laps (200 feet each) in 15 minutes.   -  Pt is doing well with exercise in cardiac rehab. However pt struggles with understanding exercise/acticity limitations. Strongly encourage rest break and PLB with activity. Pt voices understanding but constantly needs to be reinforced.    Expected Outcomes  Through exercising at rehab and at home, patient will increase physical strength and stamina and find ADL's easier to perform.   Through exercising at rehab and at home, patient will increase physical strength and stamina and find ADL's easier to perform.   Pt will continue to improve in CR fitness and aerobic capacity while understanding limitations   -  Pt will continue to improve in CR fitness and aerobic capacity while understanding limitations  Discharge Exercise Prescription (Final Exercise Prescription Changes): Exercise Prescription Changes - 06/28/17 1154      Response to Exercise   Blood Pressure (Admit)  126/74    Blood Pressure (Exercise)  160/70    Blood Pressure (Exit)  110/68    Heart Rate (Admit)  81 bpm    Heart Rate (Exercise)  127 bpm    Heart Rate (Exit)  88 bpm    Oxygen Saturation (Admit)  96 %    Oxygen Saturation (Exercise)  97 % increased to 93 with PLB    Oxygen Saturation (Exit)  96 %    Rating of Perceived Exertion (Exercise)  13    Perceived Dyspnea (Exercise)  3    Duration  Continue with 30 min of aerobic exercise without signs/symptoms of physical distress.    Intensity  THRR unchanged      Progression   Progression  Continue to progress workloads to maintain intensity without signs/symptoms of physical distress.    Average METs  3.7      Resistance Training   Training Prescription  Yes relaxation day    Weight  4lbs    Reps  10-15    Time  10 Minutes      Interval Training   Interval Training  No      NuStep   Level  5     Minutes  10    METs  4      Rower   Level  3    Watts  31    Minutes  10    METs  4.2      Track   Laps  8    Minutes  10    METs  2.39      Home Exercise Plan   Plans to continue exercise at  Home (comment)    Frequency  Add 2 additional days to program exercise sessions.    Initial Home Exercises Provided  06/02/17       Nutrition:  Target Goals: Understanding of nutrition guidelines, daily intake of sodium <1574m, cholesterol <2058m calories 30% from fat and 7% or less from saturated fats, daily to have 5 or more servings of fruits and vegetables.  Biometrics: Pre Biometrics - 05/18/17 1531      Pre Biometrics   Height  5' 7" (1.702 m)    Weight  225 lb 5 oz (102.2 kg)    Waist Circumference  46 inches    Hip Circumference  46.5 inches    Waist to Hip Ratio  0.99 %    BMI (Calculated)  35.28    Triceps Skinfold  15 mm    % Body Fat  33.4 %    Grip Strength  36 kg    Flexibility  13 in    Single Leg Stand  30 seconds      Post Biometrics - 02/02/17 1535       Post  Biometrics   Grip Strength  30 kg       Nutrition Therapy Plan and Nutrition Goals: Nutrition Therapy & Goals - 05/19/17 0913      Nutrition Therapy   Diet  Carb Modified, Heart Healthy      Personal Nutrition Goals   Nutrition Goal  Pt to be able to name foods that affect blood sugar.     Personal Goal #2  Pt to identify food quantities necessary to achieve weight loss of 6-14 lb  at graduation from cardiac rehab. Goal wt  of 210 lb desired.       Intervention Plan   Intervention  Prescribe, educate and counsel regarding individualized specific dietary modifications aiming towards targeted core components such as weight, hypertension, lipid management, diabetes, heart failure and other comorbidities.    Expected Outcomes  Short Term Goal: Understand basic principles of dietary content, such as calories, fat, sodium, cholesterol and nutrients.;Long Term Goal: Adherence to prescribed  nutrition plan.       Nutrition Assessments: Nutrition Assessments - 05/19/17 0913      MEDFICTS Scores   Pre Score  29       Nutrition Goals Re-Evaluation: Nutrition Goals Re-Evaluation    [place] Name 02/05/17 1114             Goals   Current Weight  101 lb 1.6 oz (45.9 kg)       Nutrition Goal  Pt to be able to name foods that affect blood sugar.        Comment  Pt is making healthier food choices (including desserts). Pt has maintained his wt.          Personal Goal #2 Re-Evaluation   Personal Goal #2  Pt to maintain his wt around 220 lb until pt is ready to focus on wt loss.          Personal Goal #3 Re-Evaluation   Personal Goal #3  Describe the benefit of including fruits, vegetables, whole grains, and low-fat dairy products in a healthy meal plan.         Personal Goal #4 Re-Evaluation   Personal Goal #4  Pt to increase frequency of food consumed to at least a small snack (e.g. 1/4 cup nuts, Greek yogurt) every 5 hours.          Nutrition Goals Re-Evaluation: Nutrition Goals Re-Evaluation    [place] Name 02/05/17 1114             Goals   Current Weight  101 lb 1.6 oz (45.9 kg)       Nutrition Goal  Pt to be able to name foods that affect blood sugar.        Comment  Pt is making healthier food choices (including desserts). Pt has maintained his wt.          Personal Goal #2 Re-Evaluation   Personal Goal #2  Pt to maintain his wt around 220 lb until pt is ready to focus on wt loss.          Personal Goal #3 Re-Evaluation   Personal Goal #3  Describe the benefit of including fruits, vegetables, whole grains, and low-fat dairy products in a healthy meal plan.         Personal Goal #4 Re-Evaluation   Personal Goal #4  Pt to increase frequency of food consumed to at least a small snack (e.g. 1/4 cup nuts, Greek yogurt) every 5 hours.          Nutrition Goals Discharge (Final Nutrition Goals Re-Evaluation): Nutrition Goals Re-Evaluation - 02/05/17 1114       Goals   Current Weight  101 lb 1.6 oz (45.9 kg)    Nutrition Goal  Pt to be able to name foods that affect blood sugar.     Comment  Pt is making healthier food choices (including desserts). Pt has maintained his wt.       Personal Goal #2 Re-Evaluation   Personal Goal #2  Pt to maintain his wt around 220 lb until pt  is ready to focus on wt loss.       Personal Goal #3 Re-Evaluation   Personal Goal #3  Describe the benefit of including fruits, vegetables, whole grains, and low-fat dairy products in a healthy meal plan.      Personal Goal #4 Re-Evaluation   Personal Goal #4  Pt to increase frequency of food consumed to at least a small snack (e.g. 1/4 cup nuts, Greek yogurt) every 5 hours.       Psychosocial: Target Goals: Acknowledge presence or absence of significant depression and/or stress, maximize coping skills, provide positive support system. Participant is able to verbalize types and ability to use techniques and skills needed for reducing stress and depression.  Initial Review & Psychosocial Screening: Initial Psych Review & Screening - 05/18/17 1408      Initial Review   Current issues with  None Identified    Source of Stress Concerns  None Identified      Family Dynamics   Good Support System?  Yes spouse    Comments  no psychoosocial needs identified, no interventions necessary       Barriers   Psychosocial barriers to participate in program  There are no identifiable barriers or psychosocial needs.      Screening Interventions   Interventions  Encouraged to exercise       Quality of Life Scores: Quality of Life - 05/28/17 1626      Quality of Life Scores   Health/Function Pre  17.23 % pt main concern is dyspnea on exertion. DOE mostly present with stairclimbing.      Socioeconomic Pre  25.44 %    Psych/Spiritual Pre  24 %    Family Pre  27.6 %    GLOBAL Pre  21.94 %      Scores of 19 and below usually indicate a poorer quality of life in these areas.  A  difference of  2-3 points is a clinically meaningful difference.  A difference of 2-3 points in the total score of the Quality of Life Index has been associated with significant improvement in overall quality of life, self-image, physical symptoms, and general health in studies assessing change in quality of life.  PHQ-9: Recent Review Flowsheet Data    Depression screen [facility] 2/9 05/24/2017 02/02/2017 10/19/2016 07/29/2016 07/27/2016   Decreased Interest 0 0 1 0 0   Down, Depressed, Hopeless 1 0 3 0 0   PHQ - 2 Score 1 0 4 0 0   Altered sleeping - - 3 - -   Tired, decreased energy - - 3 - -   Change in appetite - - 3 - -   Feeling bad or failure about yourself  - - 0 - -   Trouble concentrating - - 0 - -   Moving slowly or fidgety/restless - - 0 - -   Suicidal thoughts - - 0 - -   PHQ-9 Score - - 13 - -   Difficult doing work/chores - - Not difficult at all - -     Interpretation of Total Score  Total Score Depression Severity:  1-4 = Minimal depression, 5-9 = Mild depression, 10-14 = Moderate depression, 15-19 = Moderately severe depression, 20-27 = Severe depression   Psychosocial Evaluation and Intervention: Psychosocial Evaluation - 05/24/17 1538      Psychosocial Evaluation & Interventions   Interventions  Encouraged to exercise with the program and follow exercise prescription    Comments  pt with health related anxiety. pt  has feels overwhelmed from progressive medical conditions this year. pt looking forward to regain strength/stamina to get back to normal life.    Continue Psychosocial Services   Follow up required by staff       Psychosocial Re-Evaluation: Psychosocial Re-Evaluation    [place] Name 01/06/17 2130 01/29/17 1328 05/28/17 1630 07/01/17 1055       Psychosocial Re-Evaluation   Current issues with  Current Stress Concerns  None Identified  None Identified  None Identified    Comments  patient has accepted the need for valve surgery. has appointment with cardiologist  september 5 to discuss options  -  pt demonstrates eagerness to participate in CR program to increase functional ability.    pt demonstrates eagerness to participate in CR program to increase functional ability.      Expected Outcomes  -  -  -  pt will exhibit positive outlook with good coping skills.     Interventions  Encouraged to attend Pulmonary Rehabilitation for the exercise  Encouraged to attend Pulmonary Rehabilitation for the exercise  Encouraged to attend Cardiac Rehabilitation for the exercise  Encouraged to attend Cardiac Rehabilitation for the exercise    Continue Psychosocial Services   Follow up required by staff  Follow up required by staff  Follow up required by staff  Follow up required by staff    Comments  if his shortness of breath is related to his valve then pulmonary rehab may not improve his shortness of breath as he anticipated.  -  -  -      Initial Review   Source of Stress Concerns  Unable to participate in former interests or hobbies;Unable to perform yard/household activities  -  -  None Identified       Psychosocial Discharge (Final Psychosocial Re-Evaluation): Psychosocial Re-Evaluation - 07/01/17 1055      Psychosocial Re-Evaluation   Current issues with  None Identified    Comments  pt demonstrates eagerness to participate in CR program to increase functional ability.      Expected Outcomes  pt will exhibit positive outlook with good coping skills.     Interventions  Encouraged to attend Cardiac Rehabilitation for the exercise    Continue Psychosocial Services   Follow up required by staff      Initial Review   Source of Stress Concerns  None Identified       Vocational Rehabilitation: Provide vocational rehab assistance to qualifying candidates.   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 Participant will state understanding/return demonstration of topics presented.  Learning Barriers/Preferences: Learning Barriers/Preferences - 05/18/17 1355      Learning Barriers/Preferences   Learning Barriers  Sight    Learning Preferences  Audio;Computer/Internet;Pictoral;Skilled Demonstration;Verbal Instruction;Video;Written Material;Individual Instruction;Group Instruction       Education Topics: Count Your Pulse:  -Group instruction provided by verbal instruction, demonstration, patient participation and written materials to support subject.  Instructors address importance of being able to find your pulse and how to count your pulse when at home without a heart monitor.  Patients get hands on experience counting their pulse with staff help and individually.   Heart Attack, Angina, and Risk Factor Modification:  -Group instruction provided by verbal  instruction, video, and written materials to support subject.  Instructors address signs and symptoms of angina and heart attacks.    Also discuss risk factors for heart disease and how to make changes to improve heart health risk factors.   Functional Fitness:  -Group instruction provided by verbal instruction, demonstration, patient participation, and written materials to support subject.  Instructors address safety measures for doing things around the house.  Discuss how to get up and down off the floor, how to pick things up properly, how to safely get out of a chair without assistance, and balance training.   Meditation and Mindfulness:  -Group instruction provided by verbal instruction, patient participation, and written materials to support subject.  Instructor addresses importance of mindfulness and meditation practice to help reduce stress and improve awareness.  Instructor also leads participants  through a meditation exercise.    CARDIAC REHAB PHASE II EXERCISE from 06/28/2017 in [place]  Date  12/10/16  Educator  Mable Fill [[place]]      Stretching for Flexibility and Mobility:  -Group instruction provided by verbal instruction, patient participation, and written materials to support subject.  Instructors lead participants through series of stretches that are designed to increase flexibility thus improving mobility.  These stretches are additional exercise for major muscle groups that are typically performed during regular warm up and cool down.   Hands Only CPR:  -Group verbal, video, and participation provides a basic overview of AHA guidelines for community CPR. Role-play of emergencies allow participants the opportunity to practice calling for help and chest compression technique with discussion of AED use.   CARDIAC REHAB PHASE II EXERCISE from 06/28/2017 in [place]  Date  06/11/17  Instruction Review Code  2- Demonstrated Understanding      Hypertension: -Group verbal and written instruction that provides a basic overview of hypertension including the most recent diagnostic guidelines, risk factor reduction with self-care instructions and medication management.    Nutrition I class: Heart Healthy Eating:  -Group instruction provided by PowerPoint slides, verbal discussion, and written materials to support subject matter. The instructor gives an explanation and review of the Therapeutic Lifestyle Changes diet recommendations, which includes a discussion on lipid goals, dietary fat, sodium, fiber, plant stanol/sterol esters, sugar, and the components of a well-balanced, healthy diet.   CARDIAC REHAB PHASE II EXERCISE from 06/28/2017 in [place]  Date  05/18/17  Educator  RD      Nutrition II class: Lifestyle Skills:  -Group instruction provided by PowerPoint slides, verbal  discussion, and written materials to support subject matter. The instructor gives an explanation and review of label reading, grocery shopping for heart health, heart healthy recipe modifications, and ways to make healthier choices when eating out.   CARDIAC REHAB PHASE II EXERCISE from 06/28/2017 in [place]  Date  06/29/17  Educator  RD      Diabetes Question & Answer:  -Group instruction provided by PowerPoint slides, verbal discussion, and written materials to support subject matter. The instructor gives an explanation and review of diabetes co-morbidities, pre- and post-prandial blood glucose goals, pre-exercise blood glucose goals, signs, symptoms, and treatment of hypoglycemia and hyperglycemia, and foot care basics.   CARDIAC REHAB PHASE II EXERCISE from 06/28/2017 in [place]  Date  05/28/17  Educator  RD      Diabetes Blitz:  -Group instruction provided by PowerPoint slides, verbal  discussion, and written materials to support subject matter. The instructor gives an explanation and review of the physiology behind type 1 and type 2 diabetes, diabetes medications and rational behind using different medications, pre- and post-prandial blood glucose recommendations and Hemoglobin A1c goals, diabetes diet, and exercise including blood glucose guidelines for exercising safely.    Portion Distortion:  -Group instruction provided by PowerPoint slides, verbal discussion, written materials, and food models to support subject matter. The instructor gives an explanation of serving size versus portion size, changes in portions sizes over the last 20 years, and what consists of a serving from each food group.   CARDIAC REHAB PHASE II EXERCISE from 06/28/2017 in [place]  Date  06/09/17  Educator  RD  Instruction Review Code  2- Demonstrated Understanding      Stress Management:  -Group  instruction provided by verbal instruction, video, and written materials to support subject matter.  Instructors review role of stress in heart disease and how to cope with stress positively.     CARDIAC REHAB PHASE II EXERCISE from 06/28/2017 in [place]  Date  06/16/17  Instruction Review Code  2- Demonstrated Understanding      Exercising on Your Own:  -Group instruction provided by verbal instruction, power point, and written materials to support subject.  Instructors discuss benefits of exercise, components of exercise, frequency and intensity of exercise, and end points for exercise.  Also discuss use of nitroglycerin and activating EMS.  Review options of places to exercise outside of rehab.  Review guidelines for sex with heart disease.   CARDIAC REHAB PHASE II EXERCISE from 06/28/2017 in [place]  Date  05/26/17  Educator  EP      Cardiac Drugs I:  -Group instruction provided by verbal instruction and written materials to support subject.  Instructor reviews cardiac drug classes: antiplatelets, anticoagulants, beta blockers, and statins.  Instructor discusses reasons, side effects, and lifestyle considerations for each drug class.   Cardiac Drugs II:  -Group instruction provided by verbal instruction and written materials to support subject.  Instructor reviews cardiac drug classes: angiotensin converting enzyme inhibitors (ACE-I), angiotensin II receptor blockers (ARBs), nitrates, and calcium channel blockers.  Instructor discusses reasons, side effects, and lifestyle considerations for each drug class.   CARDIAC REHAB PHASE II EXERCISE from 06/28/2017 in [place]  Date  06/23/17  Instruction Review Code  2- Demonstrated Understanding      Anatomy and Physiology of the Circulatory System:  Group verbal and written instruction and models provide basic cardiac anatomy and physiology,  with the coronary electrical and arterial systems. Review of: AMI, Angina, Valve disease, Heart Failure, Peripheral Artery Disease, Cardiac Arrhythmia, Pacemakers, and the ICD.   Other Education:  -Group or individual verbal, written, or video instructions that support the educational goals of the cardiac rehab program.   Holiday Eating Survival Tips:  -Group instruction provided by PowerPoint slides, verbal discussion, and written materials to support subject matter. The instructor gives patients tips, tricks, and techniques to help them not only survive but enjoy the holidays despite the onslaught of food that accompanies the holidays.   Knowledge Questionnaire Score: Knowledge Questionnaire Score - 05/18/17 1407      Knowledge Questionnaire Score   Pre Score  24/24       Core Components/Risk Factors/Patient Goals at Admission: Personal Goals and Risk Factors at Admission - 05/18/17 1403  Core Components/Risk Factors/Patient Goals on Admission    Weight Management  Obesity    Improve shortness of breath with ADL's  Yes    Intervention  Provide education, individualized exercise plan and daily activity instruction to help decrease symptoms of SOB with activities of daily living.    Expected Outcomes  Short Term: Achieves a reduction of symptoms when performing activities of daily living.    Develop more efficient breathing techniques such as purse lipped breathing and diaphragmatic breathing; and practicing self-pacing with activity  Yes    Intervention  Provide education, demonstration and support about specific breathing techniuqes utilized for more efficient breathing. Include techniques such as pursed lipped breathing, diaphragmatic breathing and self-pacing activity.    Expected Outcomes  Short Term: Participant will be able to demonstrate and use breathing techniques as needed throughout daily activities.    Diabetes  Yes    Intervention  Provide education about  signs/symptoms and action to take for hypo/hyperglycemia.;Provide education about proper nutrition, including hydration, and aerobic/resistive exercise prescription along with prescribed medications to achieve blood glucose in normal ranges: Fasting glucose 65-99 mg/dL    Expected Outcomes  Short Term: Participant verbalizes understanding of the signs/symptoms and immediate care of hyper/hypoglycemia, proper foot care and importance of medication, aerobic/resistive exercise and nutrition plan for blood glucose control.;Long Term: Attainment of HbA1C < 7%.    Heart Failure  Yes    Intervention  Provide a combined exercise and nutrition program that is supplemented with education, support and counseling about heart failure. Directed toward relieving symptoms such as shortness of breath, decreased exercise tolerance, and extremity edema.    Expected Outcomes  Improve functional capacity of life;Short term: Attendance in program 2-3 days a week with increased exercise capacity. Reported lower sodium intake. Reported increased fruit and vegetable intake. Reports medication compliance.;Short term: Daily weights obtained and reported for increase. Utilizing diuretic protocols set by physician.;Long term: Adoption of self-care skills and reduction of barriers for early signs and symptoms recognition and intervention leading to self-care maintenance.    Lipids  Yes    Intervention  Provide education and support for participant on nutrition & aerobic/resistive exercise along with prescribed medications to achieve LDL <49m, HDL >445m    Expected Outcomes  Short Term: Participant states understanding of desired cholesterol values and is compliant with medications prescribed. Participant is following exercise prescription and nutrition guidelines.;Long Term: Cholesterol controlled with medications as prescribed, with individualized exercise RX and with personalized nutrition plan. Value goals: LDL < 7030mHDL > 40 mg.        Core Components/Risk Factors/Patient Goals Review:  Goals and Risk Factor Review    Row Name 01/06/17 2129 01/29/17 1326 02/22/17 0854 05/24/17 1534 05/28/17 1629     Core Components/Risk Factors/Patient Goals Review   Personal Goals Review  Weight Management/Obesity;Improve shortness of breath with ADL's;Increase knowledge of respiratory medications and ability to use respiratory devices properly.;Develop more efficient breathing techniques such as purse lipped breathing and diaphragmatic breathing and practicing self-pacing with activity.  Weight Management/Obesity;Improve shortness of breath with ADL's;Increase knowledge of respiratory medications and ability to use respiratory devices properly.;Develop more efficient breathing techniques such as purse lipped breathing and diaphragmatic breathing and practicing self-pacing with activity.  -  Weight Management/Obesity;Diabetes;Heart Failure  Weight Management/Obesity;Diabetes;Heart Failure   Review  patient has progressed well over the past 30 days in pulmonary rehab. he has tolerated workload increases although is limited by his shortness of breath. This may not be related to any  pulmonary illness but to his need for a valve replacement. he is utilizing PLB when appropriate. He is beginning to ask question regarding the effects of a round abdomen on breathing and has realized that if he can loose some weight his breathing may improve somewhat.   patient continues to progress well in pulmonary rehab. He is able to walk 21 laps in 15 min which is almost a mile. His shortness of breath is not improving and may be getting worse with exertions however this is valve related and unfortunately will not improve with rehab. his cardiologist gave him clearence to continue to push himself at rehab.  patient met all pulmonary rehab goals at discharge except shortness of breath. His shortness of breath is assumend to be related to a heart valve and not a lung  problem.   pt with multiple CAD RF demonstrates eagerness to participate in CR program.  pt newly diagnosed diet controlled diabetic. pt instructed to participate in Diabetes Q&A education opporutunity.    pt with multiple CAD RF demonstrates eagerness to participate in CR program.  pt pleased he is able to walk up 14 steps without dyspnea and is hoping to further increase funcational ability. pt newly diagnosed diet controlled diabetic. pt participated in Diabetes Q&A education opporutunity.     Expected Outcomes  expect to see more progression of goals in the next 30 days  expect to see more progression of goals in the next 30 days  -  pt will participate in CR exercise, nutrition and education opportunities to improve overall RF.    pt will participate in CR exercise, nutrition and education opportunities to improve overall RF.     [place] Name 07/01/17 1054             Core Components/Risk Factors/Patient Goals Review   Personal Goals Review  Weight Management/Obesity;Diabetes;Heart Failure       Review  pt with multiple CAD RF demonstrates eagerness to participate in CR program.  pt pleased he is able to further  without dyspnea and  continues to further increase funcational ability. pt newly diagnosed diet controlled diabetic. pt participated in Diabetes Q&A education opporutunity.         Expected Outcomes  pt will participate in CR exercise, nutrition and education opportunities to improve overall RF.            Core Components/Risk Factors/Patient Goals at Discharge (Final Review):  Goals and Risk Factor Review - 07/01/17 1054      Core Components/Risk Factors/Patient Goals Review   Personal Goals Review  Weight Management/Obesity;Diabetes;Heart Failure    Review  pt with multiple CAD RF demonstrates eagerness to participate in CR program.  pt pleased he is able to further  without dyspnea and  continues to further increase funcational ability. pt newly diagnosed diet controlled diabetic. pt  participated in Diabetes Q&A education opporutunity.      Expected Outcomes  pt will participate in CR exercise, nutrition and education opportunities to improve overall RF.         ITP Comments: ITP Comments    Row Name 05/18/17 1352 05/24/17 1532 06/02/17 1101 06/23/17 1118     ITP Comments  Medical Director- Dr. Fransico Him, MD.  pt started group exercise program.  pt demonstrates eagerness to participate in CR program.   30 day ITP review.  pt demonstrates eagerness to participate in CR progrm.    30 day ITP review.  pt demonstrates eagerness to participate in CR progrm.  Comments:

## 2017-07-02 ENCOUNTER — Encounter (HOSPITAL_COMMUNITY)
Admission: RE | Admit: 2017-07-02 | Discharge: 2017-07-02 | Disposition: A | Discharge status: Home / Self Care | Payer: Medicare Other | Source: Ambulatory Visit | Attending: Cardiology | Admitting: Cardiology

## 2017-07-02 ENCOUNTER — Encounter (HOSPITAL_COMMUNITY): Payer: Medicare Other

## 2017-07-02 DIAGNOSIS — Z951 Presence of aortocoronary bypass graft: Secondary | ICD-10-CM

## 2017-07-02 DIAGNOSIS — Z9889 Other specified postprocedural states: Secondary | ICD-10-CM

## 2017-07-02 DIAGNOSIS — C3491 Malignant neoplasm of unspecified part of right bronchus or lung: Secondary | ICD-10-CM | POA: Diagnosis not present

## 2017-07-05 ENCOUNTER — Encounter (HOSPITAL_COMMUNITY): Payer: Medicare Other

## 2017-07-05 ENCOUNTER — Encounter (HOSPITAL_COMMUNITY)
Admission: RE | Admit: 2017-07-05 | Discharge: 2017-07-05 | Disposition: A | Discharge status: Home / Self Care | Payer: Medicare Other | Source: Ambulatory Visit | Attending: Cardiology | Admitting: Cardiology

## 2017-07-05 DIAGNOSIS — C3491 Malignant neoplasm of unspecified part of right bronchus or lung: Secondary | ICD-10-CM | POA: Diagnosis not present

## 2017-07-05 DIAGNOSIS — Z951 Presence of aortocoronary bypass graft: Secondary | ICD-10-CM

## 2017-07-05 DIAGNOSIS — Z9889 Other specified postprocedural states: Secondary | ICD-10-CM

## 2017-07-07 ENCOUNTER — Encounter (HOSPITAL_COMMUNITY): Payer: Medicare Other

## 2017-07-07 ENCOUNTER — Encounter (HOSPITAL_COMMUNITY)
Admission: RE | Admit: 2017-07-07 | Discharge: 2017-07-07 | Disposition: A | Discharge status: Home / Self Care | Payer: Medicare Other | Source: Ambulatory Visit | Attending: Cardiology | Admitting: Cardiology

## 2017-07-07 DIAGNOSIS — C3491 Malignant neoplasm of unspecified part of right bronchus or lung: Secondary | ICD-10-CM | POA: Diagnosis not present

## 2017-07-07 DIAGNOSIS — Z9889 Other specified postprocedural states: Secondary | ICD-10-CM

## 2017-07-07 DIAGNOSIS — Z951 Presence of aortocoronary bypass graft: Secondary | ICD-10-CM

## 2017-07-09 ENCOUNTER — Encounter (HOSPITAL_COMMUNITY): Payer: Medicare Other

## 2017-07-09 ENCOUNTER — Encounter (HOSPITAL_COMMUNITY)
Admission: RE | Admit: 2017-07-09 | Discharge: 2017-07-09 | Disposition: A | Discharge status: Home / Self Care | Payer: Medicare Other | Source: Ambulatory Visit | Attending: Cardiology | Admitting: Cardiology

## 2017-07-09 DIAGNOSIS — G4733 Obstructive sleep apnea (adult) (pediatric): Secondary | ICD-10-CM | POA: Insufficient documentation

## 2017-07-09 DIAGNOSIS — I5032 Chronic diastolic (congestive) heart failure: Secondary | ICD-10-CM | POA: Diagnosis not present

## 2017-07-09 DIAGNOSIS — E119 Type 2 diabetes mellitus without complications: Secondary | ICD-10-CM | POA: Insufficient documentation

## 2017-07-09 DIAGNOSIS — I739 Peripheral vascular disease, unspecified: Secondary | ICD-10-CM | POA: Insufficient documentation

## 2017-07-09 DIAGNOSIS — Z951 Presence of aortocoronary bypass graft: Secondary | ICD-10-CM | POA: Insufficient documentation

## 2017-07-09 DIAGNOSIS — Z79899 Other long term (current) drug therapy: Secondary | ICD-10-CM | POA: Insufficient documentation

## 2017-07-09 DIAGNOSIS — Z9889 Other specified postprocedural states: Secondary | ICD-10-CM | POA: Diagnosis present

## 2017-07-09 DIAGNOSIS — I2510 Atherosclerotic heart disease of native coronary artery without angina pectoris: Secondary | ICD-10-CM | POA: Insufficient documentation

## 2017-07-09 DIAGNOSIS — Z7901 Long term (current) use of anticoagulants: Secondary | ICD-10-CM | POA: Diagnosis not present

## 2017-07-09 DIAGNOSIS — F329 Major depressive disorder, single episode, unspecified: Secondary | ICD-10-CM | POA: Insufficient documentation

## 2017-07-09 DIAGNOSIS — C3491 Malignant neoplasm of unspecified part of right bronchus or lung: Secondary | ICD-10-CM | POA: Insufficient documentation

## 2017-07-09 DIAGNOSIS — I110 Hypertensive heart disease with heart failure: Secondary | ICD-10-CM | POA: Insufficient documentation

## 2017-07-09 DIAGNOSIS — E669 Obesity, unspecified: Secondary | ICD-10-CM | POA: Insufficient documentation

## 2017-07-09 DIAGNOSIS — F419 Anxiety disorder, unspecified: Secondary | ICD-10-CM | POA: Insufficient documentation

## 2017-07-09 DIAGNOSIS — K219 Gastro-esophageal reflux disease without esophagitis: Secondary | ICD-10-CM | POA: Insufficient documentation

## 2017-07-09 DIAGNOSIS — Z7982 Long term (current) use of aspirin: Secondary | ICD-10-CM | POA: Diagnosis not present

## 2017-07-09 DIAGNOSIS — M1990 Unspecified osteoarthritis, unspecified site: Secondary | ICD-10-CM | POA: Diagnosis not present

## 2017-07-12 ENCOUNTER — Encounter (HOSPITAL_COMMUNITY): Payer: Medicare Other

## 2017-07-12 ENCOUNTER — Encounter (HOSPITAL_COMMUNITY)
Admission: RE | Admit: 2017-07-12 | Discharge: 2017-07-12 | Disposition: A | Discharge status: Home / Self Care | Payer: Medicare Other | Source: Ambulatory Visit | Attending: Cardiology | Admitting: Cardiology

## 2017-07-12 DIAGNOSIS — Z9889 Other specified postprocedural states: Secondary | ICD-10-CM

## 2017-07-12 DIAGNOSIS — Z951 Presence of aortocoronary bypass graft: Secondary | ICD-10-CM

## 2017-07-12 DIAGNOSIS — C3491 Malignant neoplasm of unspecified part of right bronchus or lung: Secondary | ICD-10-CM | POA: Diagnosis not present

## 2017-07-14 ENCOUNTER — Encounter (HOSPITAL_COMMUNITY)
Admission: RE | Admit: 2017-07-14 | Discharge: 2017-07-14 | Disposition: A | Discharge status: Home / Self Care | Payer: Medicare Other | Source: Ambulatory Visit | Attending: Cardiology | Admitting: Cardiology

## 2017-07-14 ENCOUNTER — Encounter (HOSPITAL_COMMUNITY): Payer: Medicare Other

## 2017-07-14 DIAGNOSIS — Z951 Presence of aortocoronary bypass graft: Secondary | ICD-10-CM

## 2017-07-14 DIAGNOSIS — Z9889 Other specified postprocedural states: Secondary | ICD-10-CM

## 2017-07-14 DIAGNOSIS — C3491 Malignant neoplasm of unspecified part of right bronchus or lung: Secondary | ICD-10-CM | POA: Diagnosis not present

## 2017-07-16 ENCOUNTER — Encounter (HOSPITAL_COMMUNITY): Payer: Medicare Other

## 2017-07-16 ENCOUNTER — Encounter (HOSPITAL_COMMUNITY)
Admission: RE | Admit: 2017-07-16 | Discharge: 2017-07-16 | Disposition: A | Discharge status: Home / Self Care | Payer: Medicare Other | Source: Ambulatory Visit | Attending: Cardiology | Admitting: Cardiology

## 2017-07-16 DIAGNOSIS — Z951 Presence of aortocoronary bypass graft: Secondary | ICD-10-CM

## 2017-07-16 DIAGNOSIS — Z9889 Other specified postprocedural states: Secondary | ICD-10-CM

## 2017-07-16 DIAGNOSIS — C3491 Malignant neoplasm of unspecified part of right bronchus or lung: Secondary | ICD-10-CM | POA: Diagnosis not present

## 2017-07-19 ENCOUNTER — Encounter (HOSPITAL_COMMUNITY): Payer: Medicare Other

## 2017-07-19 ENCOUNTER — Encounter (HOSPITAL_COMMUNITY)
Admission: RE | Admit: 2017-07-19 | Discharge: 2017-07-19 | Disposition: A | Discharge status: Home / Self Care | Payer: Medicare Other | Source: Ambulatory Visit | Attending: Cardiology | Admitting: Cardiology

## 2017-07-19 DIAGNOSIS — Z9889 Other specified postprocedural states: Secondary | ICD-10-CM

## 2017-07-19 DIAGNOSIS — Z951 Presence of aortocoronary bypass graft: Secondary | ICD-10-CM

## 2017-07-19 DIAGNOSIS — C3491 Malignant neoplasm of unspecified part of right bronchus or lung: Secondary | ICD-10-CM | POA: Diagnosis not present

## 2017-07-21 ENCOUNTER — Encounter (HOSPITAL_COMMUNITY): Payer: Medicare Other

## 2017-07-21 ENCOUNTER — Encounter (HOSPITAL_COMMUNITY)
Admission: RE | Admit: 2017-07-21 | Discharge: 2017-07-21 | Disposition: A | Discharge status: Home / Self Care | Payer: Medicare Other | Source: Ambulatory Visit | Attending: Cardiology | Admitting: Cardiology

## 2017-07-21 DIAGNOSIS — C3491 Malignant neoplasm of unspecified part of right bronchus or lung: Secondary | ICD-10-CM | POA: Diagnosis not present

## 2017-07-21 DIAGNOSIS — Z951 Presence of aortocoronary bypass graft: Secondary | ICD-10-CM

## 2017-07-21 DIAGNOSIS — Z9889 Other specified postprocedural states: Secondary | ICD-10-CM

## 2017-07-23 ENCOUNTER — Encounter (HOSPITAL_COMMUNITY)
Admission: RE | Admit: 2017-07-23 | Discharge: 2017-07-23 | Disposition: A | Discharge status: Home / Self Care | Payer: Medicare Other | Source: Ambulatory Visit | Attending: Cardiology | Admitting: Cardiology

## 2017-07-23 ENCOUNTER — Encounter (HOSPITAL_COMMUNITY): Payer: Medicare Other

## 2017-07-23 DIAGNOSIS — Z9889 Other specified postprocedural states: Secondary | ICD-10-CM

## 2017-07-23 DIAGNOSIS — C3491 Malignant neoplasm of unspecified part of right bronchus or lung: Secondary | ICD-10-CM | POA: Diagnosis not present

## 2017-07-23 DIAGNOSIS — Z951 Presence of aortocoronary bypass graft: Secondary | ICD-10-CM

## 2017-07-26 ENCOUNTER — Encounter: Payer: Self-pay | Admitting: Thoracic Surgery (Cardiothoracic Vascular Surgery)

## 2017-07-26 ENCOUNTER — Encounter (HOSPITAL_COMMUNITY): Payer: Medicare Other

## 2017-07-26 ENCOUNTER — Ambulatory Visit: Payer: Medicare Other | Admitting: Thoracic Surgery (Cardiothoracic Vascular Surgery)

## 2017-07-26 VITALS — BP 122/79 | HR 79 | Resp 20 | Ht 69.0 in | Wt 226.0 lb

## 2017-07-26 DIAGNOSIS — Z9889 Other specified postprocedural states: Secondary | ICD-10-CM | POA: Diagnosis not present

## 2017-07-26 DIAGNOSIS — Z951 Presence of aortocoronary bypass graft: Secondary | ICD-10-CM | POA: Diagnosis not present

## 2017-07-26 NOTE — Patient Instructions (Addendum)
Continue all previous medications without any changes at this time.  Take lasix as directed.  Consider taking it daily for a period of time to see if it helps your breathing capacity.  Continue to check your weight daily and watch for signs of fluid overload, such as swelling of your legs.  You may resume unrestricted physical activity without any particular limitations at this time.  Make every effort to stay physically active, get some type of exercise on a regular basis, and stick to a "heart healthy diet".  The long term benefits for regular exercise and a healthy diet are critically important to your overall health and wellbeing.  Endocarditis is a potentially serious infection of heart valves or inside lining of the heart.  It occurs more commonly in patients with diseased heart valves (such as patient's with aortic or mitral valve disease) and in patients who have undergone heart valve repair or replacement.  Certain surgical and dental procedures may put you at risk, such as dental cleaning, other dental procedures, or any surgery involving the respiratory, urinary, gastrointestinal tract, gallbladder or prostate gland.   To minimize your chances for develooping endocarditis, maintain good oral health and seek prompt medical attention for any infections involving the mouth, teeth, gums, skin or urinary tract.    Always notify your doctor or dentist about your underlying heart valve condition before having any invasive procedures. You will need to take antibiotics before certain procedures, including all routine dental cleanings or other dental procedures.  Your cardiologist or dentist should prescribe these antibiotics for you to be taken ahead of time.

## 2017-07-26 NOTE — Progress Notes (Signed)
[address]       [place] [address]             [phone]     CARDIOTHORACIC SURGERY OFFICE NOTE  Referring Provider is Martinique, Peter, MD PCP is [name], MD   HPI:  Patient is a 67 year old obese white male with history of mitral valve prolapse with mitral regurgitation, coronary artery disease, lung cancer status post right upper lobectomy by Dr. [name] in April 2018, obstructive sleep apnea on CPAP, type 2 diabetes mellitus, and hyperlipidemia who returns to the office today for routine follow-up status post mitral valve repair and coronary artery bypass grafting x1 on March 24, 2017.  His early postoperative recovery was uneventful and he was discharged from the hospital on the fifth postoperative day.  He was last seen here in our office on May 17, 2017 at which time he was making slow but steady progress.  Since then he was seen in follow-up by [name] on May 31, 2017.  Coumadin was stopped at that time.  He was instructed to take Lasix as needed.  Routine follow-up echocardiogram performed June 10, 2017 revealed normal left ventricular systolic function with ejection fraction estimated 65-70%.  The mitral valve repair appeared intact with no residual mitral regurgitation.  There was increased flow and some turbulence across the left ventricular outflow tract.  Mean transvalvular gradient across the mitral valve was estimated 8 mmHg.  Since then the patient has participated in the outpatient cardiac rehab program.  He returns to our office today for follow-up.  He reports that he is frustrated by the fact that he still gets short of breath with relatively low level activity.  He denies any resting shortness of breath.  He denies PND and orthopnea.  He has some mild chronic lower extremity edema.  He no longer has any significant pain in his chest although he reports the left anterior chest wall remains somewhat numb and sensitive to the  touch.  He denies any fevers chills or productive cough.  Current Outpatient Medications  Medication Sig Dispense Refill  . acetaminophen (TYLENOL) 325 MG tablet Take 650 mg by mouth every 6 (six) hours as needed for moderate pain or headache.    Marland Kitchen aspirin EC 81 MG tablet Take 1 tablet (81 mg total) by mouth daily. 90 tablet 3  . atorvastatin (LIPITOR) 20 MG tablet Take 1 tablet (20 mg total) by mouth daily. 90 tablet 3  . furosemide (LASIX) 40 MG tablet Take 1 tablet (40 mg total) by mouth daily as needed. 90 tablet 0  . metoprolol tartrate (LOPRESSOR) 25 MG tablet TAKE 1/2 TABLET BY MOUTH 2 TIMES DAILY 30 tablet 11  . Omega-3 Fatty Acids (FISH OIL) 500 MG CAPS Take 500 mg by mouth daily.     . potassium chloride SA (K-DUR,KLOR-CON) 20 MEQ tablet Take 1 tablet (20 mEq total) by mouth daily as needed. With lasix 90 tablet 0  . psyllium (METAMUCIL) 58.6 % packet Take 1 packet by mouth daily.    . sodium chloride (OCEAN) 0.65 % SOLN nasal spray Place 2 sprays into both nostrils at bedtime.    Bethann Humble Sulfate (ALLERGY RELIEF EYE DROPS OP) Apply 2 drops to eye as needed (for allergies).     . traMADol (ULTRAM) 50 MG tablet Take 1 tablet (50 mg total) by mouth every 6 (six) hours as needed (may take one or two tablets every six hrs prn). [place]  tablet 0  . Undecylenic Ac-Zn Undecylenate (FUNGI-NAIL TOE & FOOT EX) Apply 1 application topically daily as needed (for nail).      No current facility-administered medications for this visit.       Physical Exam:   BP 122/79   Pulse 79   Resp 20   Ht 5\' 9"  (1.753 m)   Wt 226 lb (102.5 kg)   SpO2 98% Comment: RA  BMI 33.37 kg/m   General:  Obese but well-appearing  Chest:   Clear to auscultation with somewhat diminished breath sounds right lung base but good air movement on both sides  CV:   Regular rate and rhythm without murmur  Incisions:  Completely healed, sternum is stable  Abdomen:  Soft nontender  Extremities:  Warm and well  perfused, 1+ bilateral lower extremity edema  Diagnostic Tests:  Transthoracic Echocardiography  Patient:    James [name], James [name] MR #:       [id number] Study Date: 06/10/2017 Gender:     M Age:        72 Height:     175.3 cm Weight:     101.7 kg BSA:        2.26 m^2 Pt. Status: Room:   ATTENDING    Peter Martinique, M.D.  ORDERING     Peter Martinique, M.D.  REFERRING    Peter Martinique, M.D.  SONOGRAPHER  Diamond Nickel  PERFORMING   [facility] Outpatient  cc:  ------------------------------------------------------------------- LV EF: 65% -   70%  ------------------------------------------------------------------- Indications:      Z98.890 Status post mitral valve repair. Z95.1 CABG. I50.32 Congestive heart failure.  ------------------------------------------------------------------- History:   PMH:  History of pulmonary embolism.  Risk factors: Dyslipidemia.  ------------------------------------------------------------------- Study Conclusions  - Left ventricle: The cavity size was normal. There was moderate   concentric hypertrophy. Systolic function was vigorous. The   estimated ejection fraction was in the range of 65% to 70%. Wall   motion was normal; there were no regional wall motion   abnormalities. Doppler parameters are consistent with abnormal   left ventricular relaxation (grade 1 diastolic dysfunction). - Aortic valve: Trileaflet; mildly thickened, mildly calcified   leaflets. Transvalvular velocity was minimally increased. There   was no stenosis. There was no regurgitation. - Mitral valve: S/P mitral valve repair with fixed posterior   leaflet. Transvalvular velocity was elevated. There was no   regurgitation. - Right ventricle: The cavity size was normal. Wall thickness was   normal. Systolic function was normal. - Tricuspid valve: There was no regurgitation. - Pericardium, extracardiac: A mild pericardial effusion was   identified posterior to the heart.  Features were not consistent   with tamponade physiology.  Impressions:  - Since the last study on 02/15/2017 mitral valve is post repair.   Transmitral velocities are elevated with mean gradient 8 mmHg.   LVEF is hyperdynamic.  ------------------------------------------------------------------- Study data:  Comparison was made to the study of 03/24/2017.  Study status:  Routine.  Procedure:  The patient reported no pain pre or post test. Transthoracic echocardiography. Image quality was adequate.  Study completion:  There were no complications. Transthoracic echocardiography.  M-mode, complete 2D, spectral Doppler, and color Doppler.  Birthdate:  Patient birthdate: 13-Jan-1951.  Age:  Patient is 67 yr old.  Sex:  Gender: male. BMI: 33.1 kg/m^2.  Blood pressure:     126/82  Patient status: Outpatient.  Study date:  Study date: 06/10/2017. Study time: 03:15 PM.  Location:  [facility]  -------------------------------------------------------------------  -------------------------------------------------------------------  Left ventricle:  The cavity size was normal. There was moderate concentric hypertrophy. Systolic function was vigorous. The estimated ejection fraction was in the range of 65% to 70%. Wall motion was normal; there were no regional wall motion abnormalities. Doppler parameters are consistent with abnormal left ventricular relaxation (grade 1 diastolic dysfunction). There was no evidence of elevated ventricular filling pressure by Doppler parameters.  ------------------------------------------------------------------- Aortic valve:   Trileaflet; mildly thickened, mildly calcified leaflets. Mobility was not restricted.  Doppler:  Transvalvular velocity was minimally increased. There was no stenosis. There was no regurgitation.  ------------------------------------------------------------------- Aorta:  Aortic root: The aortic root was normal in  size.  ------------------------------------------------------------------- Mitral valve:  S/P mitral valve repair with fixed posterior leaflet. Mobility was not restricted.  Doppler:  Transvalvular velocity was elevated. There was no regurgitation.    Peak gradient (D): 14 mm Hg. Mean gradient 8 mmHg.  ------------------------------------------------------------------- Left atrium:  The atrium was normal in size.  ------------------------------------------------------------------- Right ventricle:  The cavity size was normal. Wall thickness was normal. Systolic function was normal.  ------------------------------------------------------------------- Pulmonic valve:    Structurally normal valve.   Cusp separation was normal.  Doppler:  Transvalvular velocity was within the normal range. There was no evidence for stenosis. There was no regurgitation.  ------------------------------------------------------------------- Tricuspid valve:   Structurally normal valve.    Doppler: Transvalvular velocity was within the normal range. There was no regurgitation.  ------------------------------------------------------------------- Pulmonary artery:   The main pulmonary artery was normal-sized. Systolic pressure could not be accurately estimated.  ------------------------------------------------------------------- Right atrium:  The atrium was normal in size.  ------------------------------------------------------------------- Pericardium:  A mild pericardial effusion was identified posterior to the heart.  Doppler:  Features were not consistent with tamponade physiology.  ------------------------------------------------------------------- Systemic veins: Inferior vena cava: The vessel was normal in size.  ------------------------------------------------------------------- Measurements   Left ventricle                           Value        Reference  LV ID, ED, PLAX  chordal                  44.7  mm     43 - 52  LV ID, ES, PLAX chordal                  28.5  mm     23 - 38  LV fx shortening, PLAX chordal           36    %      >=29  LV PW thickness, ED                      13.8  mm     ---------  IVS/LV PW ratio, ED                      0.83         <=1.3    Ventricular septum                       Value        Reference  IVS thickness, ED                        11.4  mm     ---------    LVOT  Value        Reference  LVOT ID, S                               20    mm     ---------  LVOT area                                3.14  cm^2   ---------    Aorta                                    Value        Reference  Aortic root ID, ED                       34    mm     ---------    Left atrium                              Value        Reference  LA ID, A-P, ES                           39    mm     ---------  LA ID/bsa, A-P                           1.73  cm/m^2 <=2.2  LA volume, S                             86    ml     ---------  LA volume/bsa, S                         38.1  ml/m^2 ---------  LA volume, ES, 1-p A4C                   71    ml     ---------  LA volume/bsa, ES, 1-p A4C               31.4  ml/m^2 ---------  LA volume, ES, 1-p A2C                   100   ml     ---------  LA volume/bsa, ES, 1-p A2C               44.3  ml/m^2 ---------    Mitral valve                             Value        Reference  Mitral E-wave peak velocity              159   cm/s   ---------  Mitral A-wave peak velocity              160   cm/s   ---------  Mitral deceleration time       (H)       412   ms  150 - 230  Mitral peak gradient, D                  10    mm Hg  ---------  Mitral E/A ratio, peak                   1            ---------  Legend: (L)  and  (H)  mark values outside specified reference range.  ------------------------------------------------------------------- Prepared and Electronically  Authenticated by  Ena Dawley, M.D. 2019-01-31T16:36:42   Impression:  Patient appears clinically stable approximately 4 months status post mitral valve repair and coronary artery bypass grafting.  He complains of persistent significant exertional shortness of breath and he is frustrated by his slow progress with his physical rehab.  Previous chest x-rays have demonstrated volume loss on the right side consistent with previous right upper lobectomy.  The right diaphragm is expected to be somewhat elevated under the circumstances, and he seems to be moving good air on physical exam.  As a result  I am skeptical that the patient has significant phrenic nerve dysfunction, but fluoroscopy could be considered.  I have personally reviewed the patient's recent follow-up transthoracic echocardiogram which demonstrates intact mitral valve repair and normal left ventricular function.  Transvalvular gradient across the mitral valve is slightly elevated, but I suspect this related to relatively high flow.  The patient's repair was performed using an incomplete annuloplasty ring specifically to avoid creating significant mitral stenosis.  Nevertheless, the patient does have significant lower extremity edema and he may still be experiencing some degree of exertional shortness of breath related to chronic diastolic congestive heart failure.   Plan:  I have instructed the patient to consider taking Lasix on a daily basis to see if this might improve his breathing to any significant degree.  He has been reminded to continue to track his weight and to discuss his progress when he goes back for routine follow-up with [name] next month.  We have otherwise not recommended any changes to his current medications.  Fluoroscopy of his right diaphragm could be considered, although I am skeptical that he has any significant diaphragmatic dysfunction based on his current physical exam.  The patient has been reminded  regarding the importance of dental hygiene and the lifelong need for antibiotic prophylaxis for all dental cleanings and other related invasive procedures.  He will keep his appointment with Dr. Julien Nordmann for long-term follow-up related to his lung cancer.  He will return to our office next November for routine follow-up.  He will call and return sooner should specific problems or questions arise.   I spent in excess of 15 minutes during the conduct of this office consultation and >50% of this time involved direct face-to-face encounter with the patient for counseling and/or coordination of their care.    Valentina Gu. Roxy Manns, MD 07/26/2017 1:55 PM

## 2017-07-28 ENCOUNTER — Encounter (HOSPITAL_COMMUNITY): Payer: Medicare Other

## 2017-07-28 ENCOUNTER — Encounter: Payer: Self-pay | Admitting: Internal Medicine

## 2017-07-28 ENCOUNTER — Other Ambulatory Visit (INDEPENDENT_AMBULATORY_CARE_PROVIDER_SITE_OTHER): Payer: Medicare Other

## 2017-07-28 ENCOUNTER — Ambulatory Visit (INDEPENDENT_AMBULATORY_CARE_PROVIDER_SITE_OTHER): Payer: Medicare Other | Admitting: Internal Medicine

## 2017-07-28 ENCOUNTER — Encounter (HOSPITAL_COMMUNITY)
Admission: RE | Admit: 2017-07-28 | Discharge: 2017-07-28 | Disposition: A | Discharge status: Home / Self Care | Payer: Medicare Other | Source: Ambulatory Visit | Attending: Cardiology | Admitting: Cardiology

## 2017-07-28 VITALS — BP 122/80 | HR 63 | Temp 98.9°F | Resp 16 | Ht 69.0 in | Wt 229.0 lb

## 2017-07-28 DIAGNOSIS — Z0000 Encounter for general adult medical examination without abnormal findings: Secondary | ICD-10-CM

## 2017-07-28 DIAGNOSIS — Z23 Encounter for immunization: Secondary | ICD-10-CM | POA: Insufficient documentation

## 2017-07-28 DIAGNOSIS — D539 Nutritional anemia, unspecified: Secondary | ICD-10-CM | POA: Diagnosis not present

## 2017-07-28 DIAGNOSIS — E118 Type 2 diabetes mellitus with unspecified complications: Secondary | ICD-10-CM

## 2017-07-28 DIAGNOSIS — N401 Enlarged prostate with lower urinary tract symptoms: Secondary | ICD-10-CM

## 2017-07-28 DIAGNOSIS — E785 Hyperlipidemia, unspecified: Secondary | ICD-10-CM

## 2017-07-28 DIAGNOSIS — I5032 Chronic diastolic (congestive) heart failure: Secondary | ICD-10-CM

## 2017-07-28 DIAGNOSIS — C3491 Malignant neoplasm of unspecified part of right bronchus or lung: Secondary | ICD-10-CM | POA: Diagnosis not present

## 2017-07-28 DIAGNOSIS — Z951 Presence of aortocoronary bypass graft: Secondary | ICD-10-CM

## 2017-07-28 DIAGNOSIS — N138 Other obstructive and reflux uropathy: Secondary | ICD-10-CM

## 2017-07-28 DIAGNOSIS — E291 Testicular hypofunction: Secondary | ICD-10-CM

## 2017-07-28 DIAGNOSIS — D509 Iron deficiency anemia, unspecified: Secondary | ICD-10-CM | POA: Insufficient documentation

## 2017-07-28 DIAGNOSIS — Z9889 Other specified postprocedural states: Secondary | ICD-10-CM

## 2017-07-28 LAB — COMPREHENSIVE METABOLIC PANEL
ALT: 30 U/L (ref 0–53)
AST: 25 U/L (ref 0–37)
Albumin: 4.5 g/dL (ref 3.5–5.2)
Alkaline Phosphatase: 60 U/L (ref 39–117)
BUN: 17 mg/dL (ref 6–23)
CO2: 26 mEq/L (ref 19–32)
Calcium: 9.9 mg/dL (ref 8.4–10.5)
Chloride: 107 mEq/L (ref 96–112)
Creatinine, Ser: 1.2 mg/dL (ref 0.40–1.50)
GFR: 63.95 mL/min (ref >60.00)
Glucose, Bld: 141 mg/dL — ABNORMAL HIGH (ref 70–99)
Potassium: 5.1 mEq/L (ref 3.5–5.1)
Sodium: 143 mEq/L (ref 135–145)
Total Bilirubin: 0.4 mg/dL (ref 0.2–1.2)
Total Protein: 6.7 g/dL (ref 6.0–8.3)

## 2017-07-28 LAB — IBC PANEL
Iron: 55 ug/dL (ref 42–165)
Saturation Ratios: 13 % — ABNORMAL LOW (ref 20.0–50.0)
Transferrin: 302 mg/dL (ref 212.0–360.0)

## 2017-07-28 LAB — CBC WITH DIFFERENTIAL/PLATELET
Basophils Absolute: 0.1 10*3/uL (ref 0.0–0.1)
Basophils Relative: 1 % (ref 0.0–3.0)
Eosinophils Absolute: 0.1 10*3/uL (ref 0.0–0.7)
Eosinophils Relative: 1.7 % (ref 0.0–5.0)
HCT: 47.2 % (ref 39.0–52.0)
Hemoglobin: 15.7 g/dL (ref 13.0–17.0)
Lymphocytes Relative: 16.3 % (ref 12.0–46.0)
Lymphs Abs: 1.2 10*3/uL (ref 0.7–4.0)
MCHC: 33.3 g/dL (ref 30.0–36.0)
MCV: 88.3 fl (ref 78.0–100.0)
Monocytes Absolute: 0.6 10*3/uL (ref 0.1–1.0)
Monocytes Relative: 7.6 % (ref 3.0–12.0)
Neutro Abs: 5.6 10*3/uL (ref 1.4–7.7)
Neutrophils Relative %: 73.4 % (ref 43.0–77.0)
Platelets: 209 10*3/uL (ref 150.0–400.0)
RBC: 5.35 Mil/uL (ref 4.22–5.81)
RDW: 15.4 % (ref 11.5–15.5)
WBC: 7.6 10*3/uL (ref 4.0–10.5)

## 2017-07-28 LAB — VITAMIN D 25 HYDROXY (VIT D DEFICIENCY, FRACTURES): VITD: 32.92 ng/mL (ref 30.00–100.00)

## 2017-07-28 LAB — FERRITIN: Ferritin: 41.4 ng/mL (ref 22.0–322.0)

## 2017-07-28 LAB — BRAIN NATRIURETIC PEPTIDE: Pro B Natriuretic peptide (BNP): 48 pg/mL (ref 0.0–100.0)

## 2017-07-28 LAB — PSA: PSA: 0.91 ng/mL (ref 0.10–4.00)

## 2017-07-28 LAB — FOLATE: Folate: >23.6 ng/mL (ref >5.9)

## 2017-07-28 MED ORDER — ZOSTER VAC RECOMB ADJUVANTED 50 MCG/0.5ML IM SUSR: 0.5000 mL | Freq: Once | INTRAMUSCULAR | 1 refills | Status: AC

## 2017-07-28 NOTE — Progress Notes (Signed)
Subjective:  Patient ID: James [name], male    DOB: 1950-11-19  Age: 67 y.o. MRN: [id number]  CC: Annual Exam; Congestive Heart Failure; Hyperlipidemia; and Hypertension   HPI James [name] presents for a CPX.  He complains of chronic, unchanged shortness of breath.  His last set of labs showed mild anemia.  He is not aware of any sources of blood loss.  He has chronic lower extremity edema.  He denies chest pain, palpitations, fatigue, diaphoresis, dizziness, or lightheadedness.  Past Medical History:  Diagnosis Date  . Adenocarcinoma of right lung, stage 1 ([place]) 09/06/2016  . Anxiety   . Arthritis    "knees, hips, back" (10/19/2012)  . Chronic diastolic congestive heart failure ([place])   . Chronic lower back pain   . Colon polyps    10/27/2002, repeat letter 09/17/2007  . Coronary artery disease   . Coronary artery disease involving native coronary artery of native heart without angina pectoris   . Depressive disorder, not elsewhere classified    no meds  . Diabetes mellitus without complication ([place])    diet controlled- no med  (while in hosp 4/18 -elevated cbg  . Dyspnea   . Fasting hyperglycemia   . GERD (gastroesophageal reflux disease)   . Heart murmur   . Hemoptysis    abnormal CT Chest 01/29/10 - ? new GG changes RUL > not viz on plain cxr 02/26/2010  . Hypertension   . Mitral regurgitation    severe MR 08/2016  . MPN (myeloproliferative neoplasm) ([place])    1st detected 06/04/1998  . Obesity   . OSA on CPAP    last sleep study 10 years ago  . Other and unspecified hyperlipidemia   . Peripheral vascular disease ([place]) 08/2016   after lung surgey small clots in lungs,after hip dvt-5/16  . Pneumonia    4/18  . Positive PPD 1965   "non reactive in 2012" (10/19/2012)  . Routine general medical examination at a health care facility   . S/P CABG x 1 03/24/2017   LIMA to LAD  . S/P mitral valve repair 03/24/2017   Complex valvuloplasty including artificial Gore-tex  neochord placement x6 and 28 mm Sorin Annuloflex posterior annuloplasty band  . Special screening for malignant neoplasm of prostate   . Spinal stenosis, unspecified region other than cervical   . Wrist pain, left    Past Surgical History:  Procedure Laterality Date  . ANTERIOR CRUCIATE LIGAMENT REPAIR Left 1967  . CARDIAC CATHETERIZATION  2000  . CHEST TUBE INSERTION Right 10/19/2012   post bronch  . COLONOSCOPY W/ POLYPECTOMY    . CORONARY ARTERY BYPASS GRAFT N/A 03/24/2017   Procedure: CORONARY ARTERY BYPASS GRAFTING (CABG)x1 using left internal mammary artery, LIMA-LAD;  Surgeon: Rexene Alberts, MD;  Location: [facility];  Service: Open Heart Surgery;  Laterality: N/A;  . FLEXIBLE BRONCHOSCOPY  10/19/2012   Flexible video fiberoptic bronchoscopy with electromagnetic navigation and biopsies.  . INTRAVASCULAR PRESSURE WIRE/FFR STUDY N/A 08/11/2016   Procedure: Intravascular Pressure Wire/FFR Study;  Surgeon: [name], MD;  Location: [facility];  Service: Cardiovascular;  Laterality: N/A;  . LOBECTOMY Right 09/02/2016   Procedure: RIGHT UPPER LOBECTOMY;  Surgeon: Melrose Nakayama, MD;  Location: [facility];  Service: Thoracic;  Laterality: Right;  . LYMPH NODE DISSECTION Right 09/02/2016   Procedure: LYMPH NODE DISSECTION, RIGHT LUNG;  Surgeon: Melrose Nakayama, MD;  Location: [facility];  Service: Thoracic;  Laterality: Right;  . MITRAL  VALVE REPAIR N/A 03/24/2017   Procedure: MITRAL VALVE REPAIR (MVR) with Sorin Carbomedics Annuloflex size 28;  Surgeon: Rexene Alberts, MD;  Location: [facility];  Service: Open Heart Surgery;  Laterality: N/A;  . RIGHT/LEFT HEART CATH AND CORONARY ANGIOGRAPHY N/A 08/11/2016   Procedure: Right/Left Heart Cath and Coronary Angiography;  Surgeon: [name], MD;  Location: [facility];  Service: Cardiovascular;  Laterality: N/A;  . TEE WITHOUT CARDIOVERSION N/A 07/17/2016   Procedure: TRANSESOPHAGEAL ECHOCARDIOGRAM (TEE);  Surgeon: [name],  MD;  Location: [facility];  Service: Cardiovascular;  Laterality: N/A;  . TEE WITHOUT CARDIOVERSION N/A 03/24/2017   Procedure: TRANSESOPHAGEAL ECHOCARDIOGRAM (TEE);  Surgeon: Rexene Alberts, MD;  Location: [facility];  Service: Open Heart Surgery;  Laterality: N/A;  . TONSILLECTOMY  1950's  . TOTAL HIP ARTHROPLASTY Left 10/05/2014   dr [name]  . TOTAL HIP ARTHROPLASTY Left 10/05/2014   Procedure: LEFT TOTAL HIP ARTHROPLASTY ANTERIOR APPROACH;  Surgeon: [name], MD;  Location: [facility];  Service: Orthopedics;  Laterality: Left;  Marland Kitchen VIDEO ASSISTED THORACOSCOPY (VATS)/WEDGE RESECTION Right 09/02/2016   Procedure: VIDEO ASSISTED THORACOSCOPY (VATS)/RIGHT UPPER LOBE WEDGE RESECTION;  Surgeon: Melrose Nakayama, MD;  Location: [facility];  Service: Thoracic;  Laterality: Right;  Marland Kitchen VIDEO BRONCHOSCOPY WITH ENDOBRONCHIAL NAVIGATION N/A 10/19/2012   Procedure: VIDEO BRONCHOSCOPY WITH ENDOBRONCHIAL NAVIGATION;  Surgeon: [name], MD;  Location: [facility];  Service: Thoracic;  Laterality: N/A;  . WRIST RECONSTRUCTION Left 12/2009   'proximal row carpectomy" [name]    reports that he has never smoked. He has never used smokeless tobacco. He reports that he drinks about 0.6 oz of alcohol per week. He reports that he does not use drugs. family history includes Heart attack in his father; Heart disease in his father and paternal uncle; Heart failure in his mother. Allergies  Allergen Reactions  . Symbicort [Budesonide-Formoterol Fumarate] Other (See Comments) and Cough    Chest pain, wheezing   . Amoxicillin Itching and Other (See Comments)    Has patient had a PCN reaction causing immediate rash, facial/tongue/throat swelling, SOB or lightheadedness with hypotension: No Has patient had a PCN reaction causing severe rash involving mucus membranes or skin necrosis: No Has patient had a PCN reaction that required hospitalization No Has patient had a PCN reaction occurring within the last 10 years: No If all of  the above answers are "NO", then may proceed with Cephalosporin use.     Outpatient Medications Prior to Visit  Medication Sig Dispense Refill  . acetaminophen (TYLENOL) 325 MG tablet Take 650 mg by mouth every 6 (six) hours as needed for moderate pain or headache.    Marland Kitchen aspirin EC 81 MG tablet Take 1 tablet (81 mg total) by mouth daily. 90 tablet 3  . atorvastatin (LIPITOR) 20 MG tablet Take 1 tablet (20 mg total) by mouth daily. 90 tablet 3  . furosemide (LASIX) 40 MG tablet Take 1 tablet (40 mg total) by mouth daily as needed. 90 tablet 0  . metoprolol tartrate (LOPRESSOR) 25 MG tablet TAKE 1/2 TABLET BY MOUTH 2 TIMES DAILY 30 tablet 11  . Omega-3 Fatty Acids (FISH OIL) 500 MG CAPS Take 500 mg by mouth daily.     . potassium chloride SA (K-DUR,KLOR-CON) 20 MEQ tablet Take 1 tablet (20 mEq total) by mouth daily as needed. With lasix 90 tablet 0  . traMADol (ULTRAM) 50 MG tablet Take 1 tablet (50 mg total) by mouth every 6 (six) hours  as needed (may take one or two tablets every six hrs prn). 30 tablet 0  . psyllium (METAMUCIL) 58.6 % packet Take 1 packet by mouth daily.    . sodium chloride (OCEAN) 0.65 % SOLN nasal spray Place 2 sprays into both nostrils at bedtime.    Bethann Humble Sulfate (ALLERGY RELIEF EYE DROPS OP) Apply 2 drops to eye as needed (for allergies).     . Undecylenic Ac-Zn Undecylenate (FUNGI-NAIL TOE & FOOT EX) Apply 1 application topically daily as needed (for nail).      No facility-administered medications prior to visit.     ROS Review of Systems  Constitutional: Negative for appetite change, chills, diaphoresis, fatigue and fever.  HENT: Negative.  Negative for trouble swallowing.   Eyes: Negative for visual disturbance.  Respiratory: Positive for shortness of breath. Negative for cough, choking, chest tightness and wheezing.   Cardiovascular: Positive for leg swelling. Negative for chest pain and palpitations.  Gastrointestinal: Negative for abdominal  pain, constipation, diarrhea, nausea and vomiting.  Endocrine: Negative.  Negative for polydipsia, polyphagia and polyuria.  Genitourinary: Negative.  Negative for difficulty urinating, dysuria, enuresis, hematuria and urgency.  Musculoskeletal: Negative for arthralgias, back pain, myalgias and neck pain.  Skin: Negative for color change and pallor.  Allergic/Immunologic: Negative.   Neurological: Negative.  Negative for dizziness, weakness, light-headedness and numbness.  Hematological: Negative for adenopathy. Does not bruise/bleed easily.  Psychiatric/Behavioral: Negative.     Objective:  BP 122/80 (BP Location: Left Arm, Patient Position: Sitting, Cuff Size: Large)   Pulse 63   Temp 98.9 F (37.2 C) (Oral)   Resp 16   Ht 5\' 9"  (1.753 m)   Wt 229 lb (103.9 kg)   SpO2 98%   BMI 33.82 kg/m   BP Readings from Last 3 Encounters:  07/28/17 122/80  07/26/17 122/79  05/31/17 100/72    Wt Readings from Last 3 Encounters:  07/28/17 229 lb (103.9 kg)  07/26/17 226 lb (102.5 kg)  05/31/17 224 lb 4 oz (101.7 kg)    Physical Exam  Constitutional: He is oriented to person, place, and time. No distress.  HENT:  Mouth/Throat: Oropharynx is clear and moist. No oropharyngeal exudate.  Eyes: Conjunctivae are normal. Right eye exhibits no discharge. Left eye exhibits no discharge. No scleral icterus.  Neck: Normal range of motion. Neck supple. No JVD present. No thyromegaly present.  Cardiovascular: Normal rate, regular rhythm and normal heart sounds. Exam reveals no gallop and no friction rub.  No murmur heard. Pulmonary/Chest: Effort normal and breath sounds normal. No respiratory distress. He has no wheezes. He has no rales.  Abdominal: Soft. Bowel sounds are normal. He exhibits no distension and no mass. There is no tenderness. There is no guarding. Hernia confirmed negative in the right inguinal area and confirmed negative in the left inguinal area.  Genitourinary: Testes normal and  penis normal. Rectal exam shows no external hemorrhoid, no internal hemorrhoid, no fissure, no mass, no tenderness and guaiac negative stool. Prostate is enlarged (1+ smooth symm BPH). Prostate is not tender. Right testis shows no mass, no swelling and no tenderness. Left testis shows no mass, no swelling and no tenderness. Circumcised. No penile erythema or penile tenderness. No discharge found.  Musculoskeletal: He exhibits edema. He exhibits no tenderness or deformity.  1-2 pitting edema BLE  Lymphadenopathy:    He has no cervical adenopathy.       Right: No inguinal adenopathy present.       Left: No inguinal  adenopathy present.  Neurological: He is alert and oriented to person, place, and time.  Skin: Skin is warm and dry. No rash noted. He is not diaphoretic. No erythema. No pallor.  Psychiatric: He has a normal mood and affect. His behavior is normal. Judgment and thought content normal.  Vitals reviewed.   Lab Results  Component Value Date   WBC 7.6 07/28/2017   HGB 15.7 07/28/2017   HCT 47.2 07/28/2017   PLT 209.0 07/28/2017   GLUCOSE 141 (H) 07/28/2017   CHOL 177 05/27/2017   TRIG 128 05/27/2017   HDL 41 05/27/2017   LDLDIRECT 177.0 07/24/2015   LDLCALC 110 (H) 05/27/2017   ALT 30 07/28/2017   AST 25 07/28/2017   NA 143 07/28/2017   K 5.1 07/28/2017   CL 107 07/28/2017   CREATININE 1.20 07/28/2017   BUN 17 07/28/2017   CO2 26 07/28/2017   TSH 2.56 07/28/2017   PSA 0.91 07/28/2017   INR 2.4 05/14/2017   HGBA1C 6.5 (H) 03/22/2017   MICROALBUR <0.7 06/29/2016    No results found.  Assessment & Plan:   James [name] was seen today for annual exam, congestive heart failure, hyperlipidemia and hypertension.  Diagnoses and all orders for this visit:  Type 2 diabetes mellitus with complication, without long-term current use of insulin ([place])- His last A1c was 6.5%.  His blood sugars are adequately well controlled. -     Comprehensive metabolic panel; Future -     Ambulatory  referral to Ophthalmology  Hyperlipidemia with target LDL less than 130- He has achieved his LDL goal and is doing well on the statin. -     Comprehensive metabolic panel; Future -     Cancel: Lipid panel; Future -     Thyroid Panel With TSH; Future  Routine health maintenance  Hypogonadism male- His testosterone level is slightly low but I do not think this is causing any of his symptoms or abnormal labs.  I do not think it would be safe to give him a testosterone replacement. -     Testosterone Total,Free,Bio, Males; Future  Deficiency anemia- His H&H are normal now.  I will screen him for vitamin deficiencies. -     CBC with Differential/Platelet; Future -     IBC panel; Future -     Ferritin; Future -     Folate; Future -     Vitamin B1; Future -     VITAMIN D 25 Hydroxy (Vit-D Deficiency, Fractures); Future  BPH with obstruction/lower urinary tract symptoms- His PSA remains low which is reassuring that he does not have prostate cancer.  He has no symptoms that need to be treated. -     PSA; Future  Need for shingles vaccine -     Zoster Vaccine Adjuvanted [facility]) injection; Inject 0.5 mLs into the muscle once for 1 dose.  Chronic diastolic congestive heart failure ([place])- His BNP remains normal.  I do not think his shortness of breath is related to fluid overload.  His symptoms are caused by obesity and poor conditioning.  I have asked him to continue to improve his cardiovascular training. -     Brain natriuretic peptide; Future   I have discontinued Jeneen Rinks A. Nanninga "Jim"'s psyllium, sodium chloride, Undecylenic Ac-Zn Undecylenate (FUNGI-NAIL TOE & FOOT EX), and Tetrahydrozoline-Zn Sulfate (ALLERGY RELIEF EYE DROPS OP). I am also having him start on Zoster Vaccine Adjuvanted. Additionally, I am having him maintain his Fish Oil, atorvastatin, aspirin EC, acetaminophen, furosemide, potassium chloride  SA, traMADol, and metoprolol tartrate.  Meds ordered this encounter    Medications  . Zoster Vaccine Adjuvanted [facility]) injection    Sig: Inject 0.5 mLs into the muscle once for 1 dose.    Dispense:  0.5 mL    Refill:  1   See AVS for instructions about healthy living and anticipatory guidance.  Follow-up: Return in about 3 months (around 10/28/2017).  [name], MD

## 2017-07-28 NOTE — Patient Instructions (Signed)

## 2017-07-29 NOTE — Progress Notes (Signed)
Cardiac Individual Treatment Plan  Patient Details  Name: James [name] MRN: [id number] Date of Birth: 1951-04-20 Referring Provider:   Flowsheet Row CARDIAC REHAB PHASE II ORIENTATION from 05/18/2017 in [facility]  Referring Provider  Martinique, Peter MD      Initial Encounter Date:  [place] PHASE II ORIENTATION from 05/18/2017 in [facility]  Date  05/18/17  Referring Provider  Martinique, Peter MD      Visit Diagnosis: S/P MVR (mitral valve repair)  S/P CABG x 1  Patient's Home Medications on Admission:  Current Outpatient Medications:  .  acetaminophen (TYLENOL) 325 MG tablet, Take 650 mg by mouth every 6 (six) hours as needed for moderate pain or headache., Disp: , Rfl:  .  aspirin EC 81 MG tablet, Take 1 tablet (81 mg total) by mouth daily., Disp: 90 tablet, Rfl: 3 .  atorvastatin (LIPITOR) 20 MG tablet, Take 1 tablet (20 mg total) by mouth daily., Disp: 90 tablet, Rfl: 3 .  furosemide (LASIX) 40 MG tablet, Take 1 tablet (40 mg total) by mouth daily as needed., Disp: 90 tablet, Rfl: 0 .  metoprolol tartrate (LOPRESSOR) 25 MG tablet, TAKE 1/2 TABLET BY MOUTH 2 TIMES DAILY, Disp: 30 tablet, Rfl: 11 .  Omega-3 Fatty Acids (FISH OIL) 500 MG CAPS, Take 500 mg by mouth daily. , Disp: , Rfl:  .  potassium chloride SA (K-DUR,KLOR-CON) 20 MEQ tablet, Take 1 tablet (20 mEq total) by mouth daily as needed. With lasix, Disp: 90 tablet, Rfl: 0 .  traMADol (ULTRAM) 50 MG tablet, Take 1 tablet (50 mg total) by mouth every 6 (six) hours as needed (may take one or two tablets every six hrs prn)., Disp: 30 tablet, Rfl: 0  Past Medical History: Past Medical History:  Diagnosis Date  . Adenocarcinoma of right lung, stage 1 ([place]) 09/06/2016  . Anxiety   . Arthritis    "knees, hips, back" (10/19/2012)  . Chronic diastolic congestive heart failure ([place])   . Chronic lower back pain   . Colon polyps    10/27/2002, repeat  letter 09/17/2007  . Coronary artery disease   . Coronary artery disease involving native coronary artery of native heart without angina pectoris   . Depressive disorder, not elsewhere classified    no meds  . Diabetes mellitus without complication ([place])    diet controlled- no med  (while in hosp 4/18 -elevated cbg  . Dyspnea   . Fasting hyperglycemia   . GERD (gastroesophageal reflux disease)   . Heart murmur   . Hemoptysis    abnormal CT Chest 01/29/10 - ? new GG changes RUL > not viz on plain cxr 02/26/2010  . Hypertension   . Mitral regurgitation    severe MR 08/2016  . MPN (myeloproliferative neoplasm) ([place])    1st detected 06/04/1998  . Obesity   . OSA on CPAP    last sleep study 10 years ago  . Other and unspecified hyperlipidemia   . Peripheral vascular disease ([place]) 08/2016   after lung surgey small clots in lungs,after hip dvt-5/16  . Pneumonia    4/18  . Positive PPD 1965   "non reactive in 2012" (10/19/2012)  . Routine general medical examination at a health care facility   . S/P CABG x 1 03/24/2017   LIMA to LAD  . S/P mitral valve repair 03/24/2017   Complex valvuloplasty including artificial Gore-tex neochord placement x6 and 28 mm Sorin  Annuloflex posterior annuloplasty band  . Special screening for malignant neoplasm of prostate   . Spinal stenosis, unspecified region other than cervical   . Wrist pain, left     Tobacco Use: Social History   Tobacco Use  Smoking Status Never Smoker  Smokeless Tobacco Never Used    Labs: Recent Review Flowsheet Data    Labs for ITP Cardiac and Pulmonary Rehab Latest Ref Rng & Units 03/24/2017 03/24/2017 03/24/2017 03/25/2017 05/27/2017   Cholestrol 100 - 199 mg/dL - - - - 177   LDLCALC 0 - 99 mg/dL - - - - 110(H)   LDLDIRECT mg/dL - - - - -   HDL >39 mg/dL - - - - 41   Trlycerides 0 - 149 mg/dL - - - - 128   Hemoglobin A1c 4.8 - 5.6 % - - - - -   PHART 7.350 - 7.450 7.353 7.369 - - -   PCO2ART 32.0 - 48.0 mmHg 38.9  38.1 - - -   HCO3 20.0 - 28.0 mmol/L 21.8 22.0 - - -   TCO2 22 - 32 mmol/L 23 23 21(L) 21(L) -   ACIDBASEDEF 0.0 - 2.0 mmol/L 4.0(H) 3.0(H) - - -   O2SAT % 99.0 98.0 - - -      Capillary Blood Glucose: Lab Results  Component Value Date   GLUCAP 89 03/29/2017   GLUCAP 103 (H) 03/28/2017   GLUCAP 131 (H) 03/28/2017   GLUCAP 112 (H) 03/28/2017   GLUCAP 89 03/28/2017     Exercise Target Goals:    Exercise Program Goal: Individual exercise prescription set using results from initial 6 min walk test and THRR while considering  patient's activity barriers and safety.   Exercise Prescription Goal: Initial exercise prescription builds to 30-45 minutes a day of aerobic activity, 2-3 days per week.  Home exercise guidelines will be given to patient during program as part of exercise prescription that the participant will acknowledge.  Activity Barriers & Risk Stratification: Activity Barriers & Cardiac Risk Stratification - 05/18/17 1355    Activity Barriers & Cardiac Risk Stratification          Activity Barriers  Joint Problems;Deconditioning;Muscular Weakness;Shortness of Breath;Incisional Pain;Other (comment);Left Hip Replacement    Comments  L knee pain ( possible surgery), L wrist surgery in July of 2011    Cardiac Risk Stratification  High           6 Minute Walk: 6 Minute Walk    6 Minute Walk    Row Name 02/02/17 1531 05/18/17 1525   Phase  Discharge  Initial   Distance  1700 feet  1276 feet   Distance Feet Change  153 ft  no documentation   Walk Time  6 minutes  6 minutes   # of Rest Breaks  0  0   MPH  3.21  2.4   METS  3.45  2.8   RPE  14  17   Perceived Dyspnea   4  3   Symptoms  No  Yes (comment)   Comments  no documentation  SOB   Resting HR  91 bpm  80 bpm   Resting BP  134/72  140/81   Resting Oxygen Saturation   98 %  98 %   Exercise Oxygen Saturation  during 6 min walk  0 %  91 %   Max Ex. HR  153 bpm  115 bpm   Max Ex. BP  180/82  156/92   2  Minute Post BP  144/70  142/84       Interval HR    Row Name 02/02/17 1531 05/18/17 1525   1 Minute HR  131  no documentation   2 Minute HR  132  no documentation   3 Minute HR  139  no documentation   4 Minute HR  133  no documentation   5 Minute HR  153  no documentation   6 Minute HR  153  no documentation   2 Minute Post HR  101  no documentation   Interval Heart Rate?  Yes  no documentation       Interval Oxygen    Row Name 02/02/17 1531 05/18/17 1525   Interval Oxygen?  Yes  no documentation   Baseline Oxygen Saturation %  98 %  no documentation   1 Minute Oxygen Saturation %  97 %  no documentation   1 Minute Liters of Oxygen  0 L  no documentation   2 Minute Oxygen Saturation %  94 %  no documentation   2 Minute Liters of Oxygen  0 L  no documentation   3 Minute Oxygen Saturation %  93 %  no documentation   3 Minute Liters of Oxygen  0 L  no documentation   4 Minute Oxygen Saturation %  91 %  no documentation   4 Minute Liters of Oxygen  0 L  no documentation   5 Minute Oxygen Saturation %  90 %  no documentation   5 Minute Liters of Oxygen  0 L  no documentation   6 Minute Oxygen Saturation %  90 %  no documentation   6 Minute Liters of Oxygen  0 L  no documentation   2 Minute Post Oxygen Saturation %  98 %  no documentation   2 Minute Post Liters of Oxygen  0 L  no documentation          Oxygen Initial Assessment:   Oxygen Re-Evaluation:   Oxygen Discharge (Final Oxygen Re-Evaluation):   Initial Exercise Prescription: Initial Exercise Prescription - 05/18/17 1500    Date of Initial Exercise RX and Referring Provider          Date  05/18/17    Referring Provider  Martinique, Peter MD        NuStep          Level  2    Minutes  15    METs  2        Track          Laps  10    Minutes  15    METs  2.16        Prescription Details          Frequency (times per week)  3    Duration  Progress to 30 minutes of continuous aerobic without  signs/symptoms of physical distress        Intensity          THRR 40-80% of Max Heartrate  61-122    Ratings of Perceived Exertion  11-15    Perceived Dyspnea  0-4        Progression          Progression  Continue to progress workloads to maintain intensity without signs/symptoms of physical distress.        Resistance Training          Training Prescription  Yes    Weight  3lbs  Reps  10-15           Perform Capillary Blood Glucose checks as needed.  Exercise Prescription Changes: Exercise Prescription Changes    Response to Exercise    Row Name 05/24/17 1600 05/28/17 1018 06/09/17 1600 06/14/17 1647 06/28/17 1154   Blood Pressure (Admit)  142/90  128/80  142/84  124/70  126/74   Blood Pressure (Exercise)  160/80  128/64  152/72  150/70  160/70   Blood Pressure (Exit)  108/70  138/82  138/80  148/70  110/68   Heart Rate (Admit)  91 bpm  85 bpm  88 bpm  88 bpm  81 bpm   Heart Rate (Exercise)  116 bpm  110 bpm  127 bpm  131 bpm  127 bpm   Heart Rate (Exit)  89 bpm  79 bpm  94 bpm  92 bpm  88 bpm   Oxygen Saturation (Admit)  97 %  96 %  96 %  95 %  96 %   Oxygen Saturation (Exercise)  97 %  94 %  93 %  89 % increased to 93 with PLB  97 % increased to 93 with PLB   Oxygen Saturation (Exit)  98 %  98 %  96 %  94 %  96 %   Rating of Perceived Exertion (Exercise)  13  13  11  13  13    Perceived Dyspnea (Exercise)  3  3  2  3  3    Duration  Continue with 30 min of aerobic exercise without signs/symptoms of physical distress.  Continue with 30 min of aerobic exercise without signs/symptoms of physical distress.  Continue with 30 min of aerobic exercise without signs/symptoms of physical distress.  Continue with 30 min of aerobic exercise without signs/symptoms of physical distress.  Continue with 30 min of aerobic exercise without signs/symptoms of physical distress.   Intensity  THRR unchanged  THRR unchanged  THRR unchanged  THRR unchanged  THRR unchanged       Progression     Row Name 05/24/17 1600 05/28/17 1018 06/09/17 1600 06/14/17 1647 06/28/17 1154   Progression  Continue to progress workloads to maintain intensity without signs/symptoms of physical distress.  Continue to progress workloads to maintain intensity without signs/symptoms of physical distress.  Continue to progress workloads to maintain intensity without signs/symptoms of physical distress.  Continue to progress workloads to maintain intensity without signs/symptoms of physical distress.  Continue to progress workloads to maintain intensity without signs/symptoms of physical distress.   Average METs  2.7  3.1  2.8  3.4  3.7       Resistance Training    Row Name 05/24/17 1600 05/28/17 1018 06/09/17 1600 06/14/17 1647 06/28/17 1154   Training Prescription  Yes  Yes  No relaxation day  Yes relaxation day  Yes relaxation day   Weight  3lbs  3lbs  no documentation  3lbs  4lbs   Reps  10-15  10-15  no documentation  10-15  10-15   Time  10 Minutes  10 Minutes  no documentation  10 Minutes  10 Minutes       Interval Training    Row Name 05/24/17 1600 05/28/17 1018 06/09/17 1600 06/14/17 1647 06/28/17 1154   Interval Training  No  No  No  No  No       NuStep    Row Name 05/24/17 1600 05/28/17 1018 06/09/17 1600 06/14/17 1647 06/28/17 1154   Level  2  4  5  5  5    Minutes  15  15  15  15  10    METs  2.9  3.5  3.4  3.8  4       [place]    Row Name 05/24/17 1600 05/28/17 1018 06/09/17 1600 06/14/17 1647 06/28/17 1154   Level  no documentation  no documentation  no documentation  no documentation  3   Watts  no documentation  no documentation  no documentation  no documentation  31   Minutes  no documentation  no documentation  no documentation  no documentation  10   METs  no documentation  no documentation  no documentation  no documentation  4.2       Track    Row Name 05/24/17 1600 05/28/17 1018 06/09/17 1600 06/14/17 1647 06/28/17 1154   Laps  14  15  11  17  8    Minutes  15  15  15  15  10     METs  2.51  2.74  2.28  2.97  2.39       Home Exercise Plan    Row Name 05/24/17 1600 05/28/17 1018 06/09/17 1600 06/14/17 1647 06/28/17 1154   Plans to continue exercise at  no documentation  no documentation  no documentation  Home (comment)  Home (comment)   Frequency  no documentation  no documentation  no documentation  Add 2 additional days to program exercise sessions.  Add 2 additional days to program exercise sessions.   Initial Home Exercises Provided  no documentation  no documentation  no documentation  06/02/17  06/02/17       Response to Exercise    Row Name 07/12/17 1544 07/28/17 1204   Blood Pressure (Admit)  122/80  130/78   Blood Pressure (Exercise)  160/90  144/70   Blood Pressure (Exit)  126/80  102/80   Heart Rate (Admit)  83 bpm  81 bpm   Heart Rate (Exercise)  131 bpm  127 bpm   Heart Rate (Exit)  96 bpm  81 bpm   Oxygen Saturation (Admit)  96 %  97 %   Oxygen Saturation (Exercise)  99 % increased to 93 with PLB  93 %   Oxygen Saturation (Exit)  92 %  96 %   Rating of Perceived Exertion (Exercise)  12  12   Perceived Dyspnea (Exercise)  0  0   Duration  Continue with 30 min of aerobic exercise without signs/symptoms of physical distress.  Continue with 30 min of aerobic exercise without signs/symptoms of physical distress.   Intensity  THRR unchanged  THRR unchanged       Progression    Row Name 07/12/17 1544 07/28/17 1204   Progression  Continue to progress workloads to maintain intensity without signs/symptoms of physical distress.  Continue to progress workloads to maintain intensity without signs/symptoms of physical distress.   Average METs  3.5  3.5       Resistance Training    Row Name 07/12/17 1544 07/28/17 1204   Training Prescription  Yes relaxation day  Yes relaxation day   Weight  5lbs  5lbs   Reps  10-15  10-15   Time  10 Minutes  10 Minutes       Interval Training    Row Name 07/12/17 1544 07/28/17 1204   Interval Training  No  No        NuStep    Row Name 07/12/17 1544 07/28/17  1204   Level  5  5   Minutes  15  15   METs  4  4.1       Track    Row Name 07/12/17 1544 07/28/17 1204   Laps  18  16   Minutes  15  15   METs  3.09  2.86       Home Exercise Plan    Row Name 07/12/17 1544 07/28/17 1204   Plans to continue exercise at  Home (comment)  Home (comment)   Frequency  Add 2 additional days to program exercise sessions.  Add 2 additional days to program exercise sessions.   Initial Home Exercises Provided  06/02/17  06/02/17          Exercise Comments: Exercise Comments    Row Name 05/24/17 1617 06/25/17 1209 07/29/17 1208   Exercise Comments  Pt oriented to exercise equipment today. Pt responded well to exercise session; will continue to monitor and progress pt's activity levels as tolerated.   Reviewed METs and goals. Pt is tolerating Ex Rx well; often limited due to symptoms of SOB, encouraged rest breaks. Pt continues to exercise safely and is constantly reminded of activity limitations and warning signs.   Reviewed METs and goals. Pt is tolerating Ex Rx well; often limited due to symptoms of SOB, encouraged rest breaks. Pt continues to exercise safely and is constantly reminded of activity limitations and warning signs.       Exercise Goals and Review: Exercise Goals    Exercise Goals    Row Name 05/18/17 1359   Increase Physical Activity  Yes   Intervention  Provide advice, education, support and counseling about physical activity/exercise needs.;Develop an individualized exercise prescription for aerobic and resistive training based on initial evaluation findings, risk stratification, comorbidities and participant's personal goals.   Expected Outcomes  Achievement of increased cardiorespiratory fitness and enhanced flexibility, muscular endurance and strength shown through measurements of functional capacity and personal statement of participant.   Increase Strength and Stamina  Yes    Intervention  Provide advice, education, support and counseling about physical activity/exercise needs.;Develop an individualized exercise prescription for aerobic and resistive training based on initial evaluation findings, risk stratification, comorbidities and participant's personal goals.   Expected Outcomes  Achievement of increased cardiorespiratory fitness and enhanced flexibility, muscular endurance and strength shown through measurements of functional capacity and personal statement of participant.   Able to understand and use rate of perceived exertion (RPE) scale  Yes   Intervention  Provide education and explanation on how to use RPE scale   Expected Outcomes  Short Term: Able to use RPE daily in rehab to express subjective intensity level;Long Term:  Able to use RPE to guide intensity level when exercising independently   Knowledge and understanding of Target Heart Rate Range (THRR)  Yes   Intervention  Provide education and explanation of THRR including how the numbers were predicted and where they are located for reference   Expected Outcomes  Short Term: Able to state/look up THRR;Long Term: Able to use THRR to govern intensity when exercising independently;Short Term: Able to use daily as guideline for intensity in rehab   Able to check pulse independently  Yes   Intervention  Provide education and demonstration on how to check pulse in carotid and radial arteries.;Review the importance of being able to check your own pulse for safety during independent exercise   Expected Outcomes  Short Term: Able to explain why pulse checking is  important during independent exercise;Long Term: Able to check pulse independently and accurately   Understanding of Exercise Prescription  Yes   Intervention  Provide education, explanation, and written materials on patient's individual exercise prescription   Expected Outcomes  Short Term: Able to explain program exercise prescription;Long Term: Able to  explain home exercise prescription to exercise independently          Exercise Goals Re-Evaluation : Exercise Goals Re-Evaluation    Exercise Goal Re-Evaluation    Row Name 06/02/17 1108 06/02/17 1516 06/25/17 1210 07/29/17 1208 07/29/17 1209   Exercise Goals Review  Increase Strength and Stamina;Increase Physical Activity;Able to understand and use Dyspnea scale;Able to understand and use rate of perceived exertion (RPE) scale;Knowledge and understanding of Target Heart Rate Range (THRR);Understanding of Exercise Prescription  Increase Strength and Stamina;Increase Physical Activity;Able to understand and use Dyspnea scale;Able to understand and use rate of perceived exertion (RPE) scale;Knowledge and understanding of Target Heart Rate Range (THRR);Understanding of Exercise Prescription  Increase Strength and Stamina;Increase Physical Activity;Able to understand and use Dyspnea scale;Able to understand and use rate of perceived exertion (RPE) scale;Knowledge and understanding of Target Heart Rate Range (THRR);Understanding of Exercise Prescription  Increase Strength and Stamina;Increase Physical Activity;Able to understand and use Dyspnea scale;Able to understand and use rate of perceived exertion (RPE) scale;Knowledge and understanding of Target Heart Rate Range (THRR);Understanding of Exercise Prescription  no documentation   Comments  Patient is progressing well in program. Comes to each rehab session motivated to work hard. Pt is often limited by shortness of breath and over exertion. Expressed the importance of short/intermittent rest breaks. Walks up to 15 laps (200 feet each) in 15 minutes.   no documentation  Pt is doing well with exercise in cardiac rehab. However pt struggles with understanding exercise/acticity limitations. Strongly encourage rest break and PLB with activity. Pt voices understanding but constantly needs to be reinforced.   Pt struggles with   Pt struggles with SOB symptoms and  is often limited with activity progression due to limitations. Pt seen by cardiothoracic surgeon for c/o of SOB. Plan is to increase Lasix. Pt did notice improvement in SOB symptoms since the med change.    Expected Outcomes  Pt will continue to improve in CR fitness and aerobic capacity while understanding limitations   no documentation  Pt will continue to improve in CR fitness and aerobic capacity while understanding limitations   no documentation  Pt will continue to improve in CR fitness and aerobic capacity while understanding limitations            Discharge Exercise Prescription (Final Exercise Prescription Changes): Exercise Prescription Changes - 07/28/17 1204    Response to Exercise          Blood Pressure (Admit)  130/78    Blood Pressure (Exercise)  144/70    Blood Pressure (Exit)  102/80    Heart Rate (Admit)  81 bpm    Heart Rate (Exercise)  127 bpm    Heart Rate (Exit)  81 bpm    Oxygen Saturation (Admit)  97 %    Oxygen Saturation (Exercise)  93 %    Oxygen Saturation (Exit)  96 %    Rating of Perceived Exertion (Exercise)  12    Perceived Dyspnea (Exercise)  0    Duration  Continue with 30 min of aerobic exercise without signs/symptoms of physical distress.    Intensity  THRR unchanged        Progression  Progression  Continue to progress workloads to maintain intensity without signs/symptoms of physical distress.    Average METs  3.5        Resistance Training          Training Prescription  Yes relaxation day    Weight  5lbs    Reps  10-15    Time  10 Minutes        Interval Training          Interval Training  No        NuStep          Level  5    Minutes  15    METs  4.1        Track          Laps  16    Minutes  15    METs  2.86        Home Exercise Plan          Plans to continue exercise at  Home (comment)    Frequency  Add 2 additional days to program exercise sessions.    Initial Home Exercises Provided  06/02/17            Nutrition:  Target Goals: Understanding of nutrition guidelines, daily intake of sodium <1531m, cholesterol <2034m calories 30% from fat and 7% or less from saturated fats, daily to have 5 or more servings of fruits and vegetables.  Biometrics: Pre Biometrics - 05/18/17 1531    Pre Biometrics          Height  5' 7"  (1.702 m)    Weight  225 lb 5 oz (102.2 kg)    Waist Circumference  46 inches    Hip Circumference  46.5 inches    Waist to Hip Ratio  0.99 %    BMI (Calculated)  35.28    Triceps Skinfold  15 mm    % Body Fat  33.4 %    Grip Strength  36 kg    Flexibility  13 in    Single Leg Stand  30 seconds          Post Biometrics - 02/02/17 1535     Post  Biometrics          Grip Strength  30 kg           Nutrition Therapy Plan and Nutrition Goals: Nutrition Therapy & Goals - 05/19/17 0913    Nutrition Therapy          Diet  Carb Modified, Heart Healthy        Personal Nutrition Goals          Nutrition Goal  Pt to be able to name foods that affect blood sugar.     Personal Goal #2  Pt to identify food quantities necessary to achieve weight loss of 6-14 lb  at graduation from cardiac rehab. Goal wt of 210 lb desired.         Intervention Plan          Intervention  Prescribe, educate and counsel regarding individualized specific dietary modifications aiming towards targeted core components such as weight, hypertension, lipid management, diabetes, heart failure and other comorbidities.    Expected Outcomes  Short Term Goal: Understand basic principles of dietary content, such as calories, fat, sodium, cholesterol and nutrients.;Long Term Goal: Adherence to prescribed nutrition plan.           Nutrition Assessments: Nutrition Assessments - 05/19/17 [id number]  MEDFICTS Scores          Pre Score  29           Nutrition Goals Re-Evaluation: Nutrition Goals Re-Evaluation    Goals    Row Name 02/05/17 1114   Current Weight  101 lb 1.6 oz (45.9  kg)   Nutrition Goal  Pt to be able to name foods that affect blood sugar.    Comment  Pt is making healthier food choices (including desserts). Pt has maintained his wt.        Personal Goal #2 Re-Evaluation    Row Name 02/05/17 1114   Personal Goal #2  Pt to maintain his wt around 220 lb until pt is ready to focus on wt loss.        Personal Goal #3 Re-Evaluation    Row Name 02/05/17 1114   Personal Goal #3  Describe the benefit of including fruits, vegetables, whole grains, and low-fat dairy products in a healthy meal plan.       Personal Goal #4 Re-Evaluation    Row Name 02/05/17 1114   Personal Goal #4  Pt to increase frequency of food consumed to at least a small snack (e.g. 1/4 cup nuts, Greek yogurt) every 5 hours.          Nutrition Goals Re-Evaluation: Nutrition Goals Re-Evaluation    Goals    Row Name 02/05/17 1114   Current Weight  101 lb 1.6 oz (45.9 kg)   Nutrition Goal  Pt to be able to name foods that affect blood sugar.    Comment  Pt is making healthier food choices (including desserts). Pt has maintained his wt.        Personal Goal #2 Re-Evaluation    Row Name 02/05/17 1114   Personal Goal #2  Pt to maintain his wt around 220 lb until pt is ready to focus on wt loss.        Personal Goal #3 Re-Evaluation    Row Name 02/05/17 1114   Personal Goal #3  Describe the benefit of including fruits, vegetables, whole grains, and low-fat dairy products in a healthy meal plan.       Personal Goal #4 Re-Evaluation    Row Name 02/05/17 1114   Personal Goal #4  Pt to increase frequency of food consumed to at least a small snack (e.g. 1/4 cup nuts, Greek yogurt) every 5 hours.          Nutrition Goals Discharge (Final Nutrition Goals Re-Evaluation): Nutrition Goals Re-Evaluation - 02/05/17 1114    Goals          Current Weight  101 lb 1.6 oz (45.9 kg)    Nutrition Goal  Pt to be able to name foods that affect blood sugar.     Comment  Pt is making  healthier food choices (including desserts). Pt has maintained his wt.         Personal Goal #2 Re-Evaluation          Personal Goal #2  Pt to maintain his wt around 220 lb until pt is ready to focus on wt loss.         Personal Goal #3 Re-Evaluation          Personal Goal #3  Describe the benefit of including fruits, vegetables, whole grains, and low-fat dairy products in a healthy meal plan.        Personal Goal #4 Re-Evaluation  Personal Goal #4  Pt to increase frequency of food consumed to at least a small snack (e.g. 1/4 cup nuts, Greek yogurt) every 5 hours.           Psychosocial: Target Goals: Acknowledge presence or absence of significant depression and/or stress, maximize coping skills, provide positive support system. Participant is able to verbalize types and ability to use techniques and skills needed for reducing stress and depression.  Initial Review & Psychosocial Screening: Initial Psych Review & Screening - 05/18/17 1408    Initial Review          Current issues with  None Identified    Source of Stress Concerns  None Identified        Family Dynamics          Good Support System?  Yes spouse    Comments  no psychoosocial needs identified, no interventions necessary         Barriers          Psychosocial barriers to participate in program  There are no identifiable barriers or psychosocial needs.        Screening Interventions          Interventions  Encouraged to exercise           Quality of Life Scores: Quality of Life - 05/28/17 1626    Quality of Life Scores          Health/Function Pre  17.23 % pt main concern is dyspnea on exertion. DOE mostly present with stairclimbing.      Socioeconomic Pre  25.44 %    Psych/Spiritual Pre  24 %    Family Pre  27.6 %    GLOBAL Pre  21.94 %          Scores of 19 and below usually indicate a poorer quality of life in these areas.  A difference of  2-3 points is a clinically meaningful  difference.  A difference of 2-3 points in the total score of the Quality of Life Index has been associated with significant improvement in overall quality of life, self-image, physical symptoms, and general health in studies assessing change in quality of life.  PHQ-9: Recent Review Flowsheet Data    Depression screen [facility] 2/9 05/24/2017 02/02/2017 10/19/2016 07/29/2016 07/27/2016   Decreased Interest 0 0 1 0 0   Down, Depressed, Hopeless 1 0 3 0 0   PHQ - 2 Score 1 0 4 0 0   Altered sleeping - - 3 - -   Tired, decreased energy - - 3 - -   Change in appetite - - 3 - -   Feeling bad or failure about yourself  - - 0 - -   Trouble concentrating - - 0 - -   Moving slowly or fidgety/restless - - 0 - -   Suicidal thoughts - - 0 - -   PHQ-9 Score - - 13 - -   Difficult doing work/chores - - Not difficult at all - -     Interpretation of Total Score  Total Score Depression Severity:  1-4 = Minimal depression, 5-9 = Mild depression, 10-14 = Moderate depression, 15-19 = Moderately severe depression, 20-27 = Severe depression   Psychosocial Evaluation and Intervention: Psychosocial Evaluation - 05/24/17 1538    Psychosocial Evaluation & Interventions          Interventions  Encouraged to exercise with the program and follow exercise prescription    Comments  pt with health related  anxiety. pt has feels overwhelmed from progressive medical conditions this year. pt looking forward to regain strength/stamina to get back to normal life.    Continue Psychosocial Services   Follow up required by staff           Psychosocial Re-Evaluation: Psychosocial Re-Evaluation    Psychosocial Re-Evaluation    [place] Name 05/28/17 1630 07/01/17 1055 07/20/17 1457 07/29/17 1329   Current issues with  None Identified  None Identified  None Identified  None Identified   Comments  pt demonstrates eagerness to participate in CR program to increase functional ability.    pt demonstrates eagerness to participate in CR  program to increase functional ability.    pt demonstrates eagerness to participate in CR program to increase functional ability.    pt demonstrates eagerness to participate in CR program to increase functional ability.     Expected Outcomes  no documentation  pt will exhibit positive outlook with good coping skills.   no documentation  pt will exhibit positive outlook with good coping skills.    Interventions  Encouraged to attend Cardiac Rehabilitation for the exercise  Encouraged to attend Cardiac Rehabilitation for the exercise  Encouraged to attend Cardiac Rehabilitation for the exercise  Encouraged to attend Cardiac Rehabilitation for the exercise   Continue Psychosocial Services   Follow up required by staff  Follow up required by staff  Follow up required by staff  Follow up required by staff   Comments  no documentation  no documentation  if his shortness of breath is related to his valve then pulmonary rehab may not improve his shortness of breath as he anticipated.  no documentation       Initial Review    Row Name 05/28/17 1630 07/01/17 1055 07/20/17 1457 07/29/17 1329   Source of Stress Concerns  no documentation  None Identified  None Identified  no documentation          Psychosocial Discharge (Final Psychosocial Re-Evaluation): Psychosocial Re-Evaluation - 07/29/17 1329    Psychosocial Re-Evaluation          Current issues with  None Identified    Comments  pt demonstrates eagerness to participate in CR program to increase functional ability.      Expected Outcomes  pt will exhibit positive outlook with good coping skills.     Interventions  Encouraged to attend Cardiac Rehabilitation for the exercise    Continue Psychosocial Services   Follow up required by staff           Vocational Rehabilitation: Provide vocational rehab assistance to qualifying candidates.   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 Participant will state understanding/return demonstration of topics presented.  Learning Barriers/Preferences: Learning Barriers/Preferences - 05/18/17 1355    Learning Barriers/Preferences          Learning Barriers  Sight    Learning Preferences  Audio;Computer/Internet;Pictoral;Skilled Demonstration;Verbal Instruction;Video;Written Material;Individual Instruction;Group Instruction           Education Topics: Count Your Pulse:  -Group instruction provided by verbal instruction, demonstration, patient participation and written materials to support subject.  Instructors address importance of being able to find your pulse and  how to count your pulse when at home without a heart monitor.  Patients get hands on experience counting their pulse with staff help and individually.   Heart Attack, Angina, and Risk Factor Modification:  -Group instruction provided by verbal instruction, video, and written materials to support subject.  Instructors address signs and symptoms of angina and heart attacks.    Also discuss risk factors for heart disease and how to make changes to improve heart health risk factors. Flowsheet Row CARDIAC REHAB PHASE II EXERCISE from 07/23/2017 in [place]  Date  07/07/17  Instruction Review Code  2- Demonstrated Understanding      Functional Fitness:  -Group instruction provided by verbal instruction, demonstration, patient participation, and written materials to support subject.  Instructors address safety measures for doing things around the house.  Discuss how to get up and down off the floor, how to pick things up properly, how to safely get out of a chair without assistance, and balance  training. Flowsheet Row CARDIAC REHAB PHASE II EXERCISE from 07/23/2017 in [place]  Date  07/23/17  Instruction Review Code  2- Demonstrated Understanding      Meditation and Mindfulness:  -Group instruction provided by verbal instruction, patient participation, and written materials to support subject.  Instructor addresses importance of mindfulness and meditation practice to help reduce stress and improve awareness.  Instructor also leads participants through a meditation exercise.  Flowsheet Row CARDIAC REHAB PHASE II EXERCISE from 07/23/2017 in [place]  Date  12/10/16  Educator  Mable Fill [[place]]      Stretching for Flexibility and Mobility:  -Group instruction provided by verbal instruction, patient participation, and written materials to support subject.  Instructors lead participants through series of stretches that are designed to increase flexibility thus improving mobility.  These stretches are additional exercise for major muscle groups that are typically performed during regular warm up and cool down.   Hands Only CPR:  -Group verbal, video, and participation provides a basic overview of AHA guidelines for community CPR. Role-play of emergencies allow participants the opportunity to practice calling for help and chest compression technique with discussion of AED use. Flowsheet Row CARDIAC REHAB PHASE II EXERCISE from 07/23/2017 in [place]  Date  06/11/17  Instruction Review Code  2- Demonstrated Understanding      Hypertension: -Group verbal and written instruction that provides a basic overview of hypertension including the most recent diagnostic guidelines, risk factor reduction with self-care instructions and medication management.    Nutrition I class: Heart Healthy Eating:  -Group instruction provided by PowerPoint slides, verbal discussion, and written materials to  support subject matter. The instructor gives an explanation and review of the Therapeutic Lifestyle Changes diet recommendations, which includes a discussion on lipid goals, dietary fat, sodium, fiber, plant stanol/sterol esters, sugar, and the components of a well-balanced, healthy diet. Flowsheet Row CARDIAC REHAB PHASE II EXERCISE from 07/23/2017 in [place]  Date  07/27/17  Educator  RD      Nutrition II class: Lifestyle Skills:  -Group instruction provided by PowerPoint slides, verbal discussion, and written materials to support subject matter. The instructor gives an explanation and review of label reading, grocery shopping for heart health, heart healthy recipe modifications, and ways to make healthier choices when eating out. Flowsheet Row CARDIAC REHAB PHASE II EXERCISE from 07/23/2017 in [place]  Date  06/29/17  Educator  RD      Diabetes Question & Answer:  -Group instruction provided by PowerPoint slides, verbal discussion, and written materials to support subject matter. The instructor gives an explanation and review of diabetes co-morbidities, pre- and post-prandial blood glucose goals, pre-exercise blood glucose goals, signs, symptoms, and treatment of hypoglycemia and hyperglycemia, and foot care basics. Flowsheet Row CARDIAC REHAB PHASE II EXERCISE from 07/23/2017 in [place]  Date  07/02/17  Educator  RD      Diabetes Blitz:  -Group instruction provided by PowerPoint slides, verbal discussion, and written materials to support subject matter. The instructor gives an explanation and review of the physiology behind type 1 and type 2 diabetes, diabetes medications and rational behind using different medications, pre- and post-prandial blood glucose recommendations and Hemoglobin A1c goals, diabetes diet, and exercise including blood glucose guidelines for exercising safely.     Portion Distortion:  -Group instruction provided by PowerPoint slides, verbal discussion, written materials, and food models to support subject matter. The instructor gives an explanation of serving size versus portion size, changes in portions sizes over the last 20 years, and what consists of a serving from each food group. Flowsheet Row CARDIAC REHAB PHASE II EXERCISE from 07/23/2017 in [place]  Date  06/09/17  Educator  RD  Instruction Review Code  2- Demonstrated Understanding      Stress Management:  -Group instruction provided by verbal instruction, video, and written materials to support subject matter.  Instructors review role of stress in heart disease and how to cope with stress positively.   Flowsheet Row CARDIAC REHAB PHASE II EXERCISE from 07/23/2017 in [place]  Date  06/16/17  Instruction Review Code  2- Demonstrated Understanding      Exercising on Your Own:  -Group instruction provided by verbal instruction, power point, and written materials to support subject.  Instructors discuss benefits of exercise, components of exercise, frequency and intensity of exercise, and end points for exercise.  Also discuss use of nitroglycerin and activating EMS.  Review options of places to exercise outside of rehab.  Review guidelines for sex with heart disease. Flowsheet Row CARDIAC REHAB PHASE II EXERCISE from 07/23/2017 in [place]  Date  07/14/17  Educator  EP  Instruction Review Code  2- Demonstrated Understanding      Cardiac Drugs I:  -Group instruction provided by verbal instruction and written materials to support subject.  Instructor reviews cardiac drug classes: antiplatelets, anticoagulants, beta blockers, and statins.  Instructor discusses reasons, side effects, and lifestyle considerations for each drug class. Flowsheet Row CARDIAC REHAB PHASE II EXERCISE from  07/23/2017 in [place]  Date  07/21/17  Educator  -- [pharmacy]  Instruction Review Code  2- Demonstrated Understanding      Cardiac Drugs II:  -Group instruction provided by verbal instruction and written materials to support subject.  Instructor reviews cardiac drug classes: angiotensin converting enzyme inhibitors (ACE-I), angiotensin II receptor blockers (ARBs), nitrates, and calcium channel blockers.  Instructor discusses reasons, side effects, and lifestyle considerations for each drug class. Flowsheet Row CARDIAC REHAB PHASE II EXERCISE from 07/23/2017 in [place]  Date  06/23/17  Instruction Review Code  2- Demonstrated Understanding      Anatomy and Physiology of the Circulatory System:  Group verbal and written instruction and models provide basic cardiac anatomy  and physiology, with the coronary electrical and arterial systems. Review of: AMI, Angina, Valve disease, Heart Failure, Peripheral Artery Disease, Cardiac Arrhythmia, Pacemakers, and the ICD.   Other Education:  -Group or individual verbal, written, or video instructions that support the educational goals of the cardiac rehab program.   Holiday Eating Survival Tips:  -Group instruction provided by PowerPoint slides, verbal discussion, and written materials to support subject matter. The instructor gives patients tips, tricks, and techniques to help them not only survive but enjoy the holidays despite the onslaught of food that accompanies the holidays.   Knowledge Questionnaire Score: Knowledge Questionnaire Score - 05/18/17 1407    Knowledge Questionnaire Score          Pre Score  24/24           Core Components/Risk Factors/Patient Goals at Admission: Personal Goals and Risk Factors at Admission - 05/18/17 1403    Core Components/Risk Factors/Patient Goals on Admission           Weight Management  Obesity    Improve shortness of breath  with ADL's  Yes    Intervention  Provide education, individualized exercise plan and daily activity instruction to help decrease symptoms of SOB with activities of daily living.    Expected Outcomes  Short Term: Achieves a reduction of symptoms when performing activities of daily living.    Develop more efficient breathing techniques such as purse lipped breathing and diaphragmatic breathing; and practicing self-pacing with activity  Yes    Intervention  Provide education, demonstration and support about specific breathing techniuqes utilized for more efficient breathing. Include techniques such as pursed lipped breathing, diaphragmatic breathing and self-pacing activity.    Expected Outcomes  Short Term: Participant will be able to demonstrate and use breathing techniques as needed throughout daily activities.    Diabetes  Yes    Intervention  Provide education about signs/symptoms and action to take for hypo/hyperglycemia.;Provide education about proper nutrition, including hydration, and aerobic/resistive exercise prescription along with prescribed medications to achieve blood glucose in normal ranges: Fasting glucose 65-99 mg/dL    Expected Outcomes  Short Term: Participant verbalizes understanding of the signs/symptoms and immediate care of hyper/hypoglycemia, proper foot care and importance of medication, aerobic/resistive exercise and nutrition plan for blood glucose control.;Long Term: Attainment of HbA1C < 7%.    Heart Failure  Yes    Intervention  Provide a combined exercise and nutrition program that is supplemented with education, support and counseling about heart failure. Directed toward relieving symptoms such as shortness of breath, decreased exercise tolerance, and extremity edema.    Expected Outcomes  Improve functional capacity of life;Short term: Attendance in program 2-3 days a week with increased exercise capacity. Reported lower sodium intake. Reported increased fruit and vegetable  intake. Reports medication compliance.;Short term: Daily weights obtained and reported for increase. Utilizing diuretic protocols set by physician.;Long term: Adoption of self-care skills and reduction of barriers for early signs and symptoms recognition and intervention leading to self-care maintenance.    Lipids  Yes    Intervention  Provide education and support for participant on nutrition & aerobic/resistive exercise along with prescribed medications to achieve LDL <78m, HDL >46m    Expected Outcomes  Short Term: Participant states understanding of desired cholesterol values and is compliant with medications prescribed. Participant is following exercise prescription and nutrition guidelines.;Long Term: Cholesterol controlled with medications as prescribed, with individualized exercise RX and with personalized nutrition plan. Value goals: LDL < 7043mHDL > 40 mg.  Core Components/Risk Factors/Patient Goals Review:  Goals and Risk Factor Review    Core Components/Risk Factors/Patient Goals Review    Row Name 02/22/17 0854 05/24/17 1534 05/28/17 1629 07/01/17 1054 07/20/17 1451   Personal Goals Review  no documentation  Weight Management/Obesity;Diabetes;Heart Failure  Weight Management/Obesity;Diabetes;Heart Failure  Weight Management/Obesity;Diabetes;Heart Failure  Weight Management/Obesity;Diabetes;Heart Failure   Review  patient met all pulmonary rehab goals at discharge except shortness of breath. His shortness of breath is assumend to be related to a heart valve and not a lung problem.   pt with multiple CAD RF demonstrates eagerness to participate in CR program.  pt newly diagnosed diet controlled diabetic. pt instructed to participate in Diabetes Q&A education opporutunity.    pt with multiple CAD RF demonstrates eagerness to participate in CR program.  pt pleased he is able to walk up 14 steps without dyspnea and is hoping to further increase funcational ability. pt newly  diagnosed diet controlled diabetic. pt participated in Diabetes Q&A education opporutunity.    pt with multiple CAD RF demonstrates eagerness to participate in CR program.  pt pleased he is able to further  without dyspnea and  continues to further increase funcational ability. pt newly diagnosed diet controlled diabetic. pt participated in Diabetes Q&A education opporutunity.    pt with multiple CAD RF demonstrates eagerness to participate in CR program.  pt newly diagnosed diet controlled diabetic. pt reports DOE unchanged, however reports he is able to walk 3 miles in 1 hour without dyspnea.  DOE worse with harder than usual exertion, climbing uphill and carrying heavy objects.       Expected Outcomes  no documentation  pt will participate in CR exercise, nutrition and education opportunities to improve overall RF.    pt will participate in CR exercise, nutrition and education opportunities to improve overall RF.    pt will participate in CR exercise, nutrition and education opportunities to improve overall RF.    pt will participate in CR exercise, nutrition and education opportunities to improve overall RF.            Core Components/Risk Factors/Patient Goals at Discharge (Final Review):  Goals and Risk Factor Review - 07/20/17 1451    Core Components/Risk Factors/Patient Goals Review          Personal Goals Review  Weight Management/Obesity;Diabetes;Heart Failure    Review  pt with multiple CAD RF demonstrates eagerness to participate in CR program.  pt newly diagnosed diet controlled diabetic. pt reports DOE unchanged, however reports he is able to walk 3 miles in 1 hour without dyspnea.  DOE worse with harder than usual exertion, climbing uphill and carrying heavy objects.        Expected Outcomes  pt will participate in CR exercise, nutrition and education opportunities to improve overall RF.             ITP Comments: ITP Comments    Row Name 05/18/17 1352 05/24/17 1532 06/02/17 1101  06/23/17 1118 07/20/17 1451   ITP Comments  Medical Director- Dr. Fransico Him, MD.  pt started group exercise program.  pt demonstrates eagerness to participate in CR program.   30 day ITP review.  pt demonstrates eagerness to participate in CR progrm.    30 day ITP review.  pt demonstrates eagerness to participate in CR progrm.    30 day ITP review.  pt demonstrates eagerness to participate in CR progrm.        Comments:

## 2017-07-30 ENCOUNTER — Encounter (HOSPITAL_COMMUNITY)
Admission: RE | Admit: 2017-07-30 | Discharge: 2017-07-30 | Disposition: A | Discharge status: Home / Self Care | Payer: Medicare Other | Source: Ambulatory Visit | Attending: Cardiology | Admitting: Cardiology

## 2017-07-30 ENCOUNTER — Encounter: Payer: Self-pay | Admitting: Internal Medicine

## 2017-07-30 ENCOUNTER — Encounter (HOSPITAL_COMMUNITY): Payer: Medicare Other

## 2017-07-30 ENCOUNTER — Other Ambulatory Visit (INDEPENDENT_AMBULATORY_CARE_PROVIDER_SITE_OTHER): Payer: Medicare Other

## 2017-07-30 DIAGNOSIS — R739 Hyperglycemia, unspecified: Secondary | ICD-10-CM

## 2017-07-30 DIAGNOSIS — Z9889 Other specified postprocedural states: Secondary | ICD-10-CM

## 2017-07-30 DIAGNOSIS — Z951 Presence of aortocoronary bypass graft: Secondary | ICD-10-CM

## 2017-07-30 DIAGNOSIS — C3491 Malignant neoplasm of unspecified part of right bronchus or lung: Secondary | ICD-10-CM | POA: Diagnosis not present

## 2017-07-30 LAB — HEMOGLOBIN A1C: Hgb A1c MFr Bld: 6.2 % (ref 4.6–6.5)

## 2017-07-30 NOTE — Progress Notes (Addendum)
Per PCP - lab add on sheet sent to lab for A1c - dx: R73.9 hyperglycemia.

## 2017-07-31 ENCOUNTER — Encounter: Payer: Self-pay | Admitting: Internal Medicine

## 2017-08-01 LAB — TESTOSTERONE TOTAL,FREE,BIO, MALES
Albumin: 4.2 g/dL (ref 3.6–5.1)
Sex Hormone Binding: 25 nmol/L (ref 22–77)
Testosterone: 180 ng/dL — ABNORMAL LOW (ref 250–827)

## 2017-08-01 LAB — THYROID PANEL WITH TSH
Free Thyroxine Index: 2.2 (ref 1.4–3.8)
T3 Uptake: 29 % (ref 22–35)
T4, Total: 7.7 ug/dL (ref 4.9–10.5)
TSH: 2.56 mIU/L (ref 0.40–4.50)

## 2017-08-01 LAB — VITAMIN B1: Vitamin B1 (Thiamine): 13 nmol/L (ref 8–30)

## 2017-08-02 ENCOUNTER — Encounter (HOSPITAL_COMMUNITY): Payer: Medicare Other

## 2017-08-02 ENCOUNTER — Encounter (HOSPITAL_COMMUNITY)
Admission: RE | Admit: 2017-08-02 | Discharge: 2017-08-02 | Disposition: A | Discharge status: Home / Self Care | Payer: Medicare Other | Source: Ambulatory Visit | Attending: Cardiology | Admitting: Cardiology

## 2017-08-02 DIAGNOSIS — C3491 Malignant neoplasm of unspecified part of right bronchus or lung: Secondary | ICD-10-CM | POA: Diagnosis not present

## 2017-08-02 DIAGNOSIS — Z951 Presence of aortocoronary bypass graft: Secondary | ICD-10-CM

## 2017-08-02 DIAGNOSIS — Z9889 Other specified postprocedural states: Secondary | ICD-10-CM

## 2017-08-04 ENCOUNTER — Encounter (HOSPITAL_COMMUNITY): Payer: Medicare Other

## 2017-08-06 ENCOUNTER — Encounter (HOSPITAL_COMMUNITY): Payer: Medicare Other

## 2017-08-06 ENCOUNTER — Encounter (HOSPITAL_COMMUNITY)
Admission: RE | Admit: 2017-08-06 | Discharge: 2017-08-06 | Disposition: A | Discharge status: Home / Self Care | Payer: Medicare Other | Source: Ambulatory Visit | Attending: Cardiology | Admitting: Cardiology

## 2017-08-06 DIAGNOSIS — C3491 Malignant neoplasm of unspecified part of right bronchus or lung: Secondary | ICD-10-CM

## 2017-08-06 DIAGNOSIS — Z9889 Other specified postprocedural states: Secondary | ICD-10-CM

## 2017-08-06 DIAGNOSIS — Z951 Presence of aortocoronary bypass graft: Secondary | ICD-10-CM

## 2017-08-07 ENCOUNTER — Other Ambulatory Visit: Payer: Self-pay | Admitting: Cardiology

## 2017-08-09 ENCOUNTER — Encounter (HOSPITAL_COMMUNITY)
Admission: RE | Admit: 2017-08-09 | Discharge: 2017-08-09 | Disposition: A | Discharge status: Home / Self Care | Payer: Medicare Other | Source: Ambulatory Visit | Attending: Cardiology | Admitting: Cardiology

## 2017-08-09 ENCOUNTER — Encounter (HOSPITAL_COMMUNITY): Payer: Medicare Other

## 2017-08-09 DIAGNOSIS — Z7982 Long term (current) use of aspirin: Secondary | ICD-10-CM | POA: Diagnosis not present

## 2017-08-09 DIAGNOSIS — Z7901 Long term (current) use of anticoagulants: Secondary | ICD-10-CM | POA: Diagnosis not present

## 2017-08-09 DIAGNOSIS — Z79899 Other long term (current) drug therapy: Secondary | ICD-10-CM | POA: Insufficient documentation

## 2017-08-09 DIAGNOSIS — K219 Gastro-esophageal reflux disease without esophagitis: Secondary | ICD-10-CM | POA: Diagnosis not present

## 2017-08-09 DIAGNOSIS — Z951 Presence of aortocoronary bypass graft: Secondary | ICD-10-CM | POA: Diagnosis present

## 2017-08-09 DIAGNOSIS — I5032 Chronic diastolic (congestive) heart failure: Secondary | ICD-10-CM | POA: Insufficient documentation

## 2017-08-09 DIAGNOSIS — I110 Hypertensive heart disease with heart failure: Secondary | ICD-10-CM | POA: Diagnosis not present

## 2017-08-09 DIAGNOSIS — I739 Peripheral vascular disease, unspecified: Secondary | ICD-10-CM | POA: Diagnosis not present

## 2017-08-09 DIAGNOSIS — C3491 Malignant neoplasm of unspecified part of right bronchus or lung: Secondary | ICD-10-CM | POA: Diagnosis not present

## 2017-08-09 DIAGNOSIS — I2510 Atherosclerotic heart disease of native coronary artery without angina pectoris: Secondary | ICD-10-CM | POA: Insufficient documentation

## 2017-08-09 DIAGNOSIS — G4733 Obstructive sleep apnea (adult) (pediatric): Secondary | ICD-10-CM | POA: Diagnosis not present

## 2017-08-09 DIAGNOSIS — F329 Major depressive disorder, single episode, unspecified: Secondary | ICD-10-CM | POA: Insufficient documentation

## 2017-08-09 DIAGNOSIS — Z9889 Other specified postprocedural states: Secondary | ICD-10-CM | POA: Diagnosis present

## 2017-08-09 DIAGNOSIS — F419 Anxiety disorder, unspecified: Secondary | ICD-10-CM | POA: Diagnosis not present

## 2017-08-09 DIAGNOSIS — E119 Type 2 diabetes mellitus without complications: Secondary | ICD-10-CM | POA: Diagnosis not present

## 2017-08-09 DIAGNOSIS — M1990 Unspecified osteoarthritis, unspecified site: Secondary | ICD-10-CM | POA: Diagnosis not present

## 2017-08-09 DIAGNOSIS — E669 Obesity, unspecified: Secondary | ICD-10-CM | POA: Diagnosis not present

## 2017-08-11 ENCOUNTER — Encounter (HOSPITAL_COMMUNITY): Payer: Medicare Other

## 2017-08-11 ENCOUNTER — Encounter (HOSPITAL_COMMUNITY)
Admission: RE | Admit: 2017-08-11 | Discharge: 2017-08-11 | Disposition: A | Discharge status: Home / Self Care | Payer: Medicare Other | Source: Ambulatory Visit | Attending: Cardiology | Admitting: Cardiology

## 2017-08-11 DIAGNOSIS — Z951 Presence of aortocoronary bypass graft: Secondary | ICD-10-CM

## 2017-08-11 DIAGNOSIS — C3491 Malignant neoplasm of unspecified part of right bronchus or lung: Secondary | ICD-10-CM | POA: Diagnosis not present

## 2017-08-11 DIAGNOSIS — Z9889 Other specified postprocedural states: Secondary | ICD-10-CM

## 2017-08-11 NOTE — Progress Notes (Signed)
Daily Session Note  Patient Details  Name: DETRICK DANI MRN: [id number] Date of Birth: [birth year] Referring Provider:   Flowsheet Row CARDIAC REHAB PHASE II ORIENTATION from 05/18/2017 in [facility]  Referring Provider  [place], Peter MD      Encounter Date: 08/11/2017  Check In:   Capillary Blood Glucose: No results found for this or any previous visit (from the past 24 hour(s)).    Social History   Tobacco Use  Smoking Status Never Smoker  Smokeless Tobacco Never Used    Goals Met:  Exercise tolerated well  Goals Unmet:  dyspnea  Comments: pt c/o dyspnea on exertion, 2-3 on dyspnea scale.  Pt denies change in routine today.  Pt took lasix yesterday.  Pt reports he take lasix PRN approximately 2x weekly.  Pt lung sounds diminished right base, otherwise clear.  Pt encouraged to pace activities and use pursed lip breathing technique.  Pt also encouraged to follow up with pulmonary in addition to scheduled cardiology appt next week. Pt verbalized understanding. Andi Hence, RN, BSN Cardiac Pulmonary Rehab 08/11/17 2:23 PM   Dr. Fransico Him is Medical Director for Cardiac Rehab at [facility].

## 2017-08-13 ENCOUNTER — Encounter (HOSPITAL_COMMUNITY): Payer: Medicare Other

## 2017-08-13 ENCOUNTER — Encounter (HOSPITAL_COMMUNITY)
Admission: RE | Admit: 2017-08-13 | Discharge: 2017-08-13 | Disposition: A | Discharge status: Home / Self Care | Payer: Medicare Other | Source: Ambulatory Visit | Attending: Cardiology | Admitting: Cardiology

## 2017-08-13 DIAGNOSIS — Z951 Presence of aortocoronary bypass graft: Secondary | ICD-10-CM

## 2017-08-13 DIAGNOSIS — Z9889 Other specified postprocedural states: Secondary | ICD-10-CM

## 2017-08-13 DIAGNOSIS — C3491 Malignant neoplasm of unspecified part of right bronchus or lung: Secondary | ICD-10-CM | POA: Diagnosis not present

## 2017-08-16 ENCOUNTER — Encounter (HOSPITAL_COMMUNITY): Payer: Medicare Other

## 2017-08-16 ENCOUNTER — Encounter (HOSPITAL_COMMUNITY)
Admission: RE | Admit: 2017-08-16 | Discharge: 2017-08-16 | Disposition: A | Discharge status: Home / Self Care | Payer: Medicare Other | Source: Ambulatory Visit | Attending: Cardiology | Admitting: Cardiology

## 2017-08-16 VITALS — Ht 67.0 in | Wt 228.0 lb

## 2017-08-16 DIAGNOSIS — C3491 Malignant neoplasm of unspecified part of right bronchus or lung: Secondary | ICD-10-CM | POA: Diagnosis not present

## 2017-08-16 DIAGNOSIS — Z9889 Other specified postprocedural states: Secondary | ICD-10-CM

## 2017-08-16 DIAGNOSIS — Z951 Presence of aortocoronary bypass graft: Secondary | ICD-10-CM

## 2017-08-18 ENCOUNTER — Encounter (HOSPITAL_COMMUNITY): Payer: Medicare Other

## 2017-08-18 ENCOUNTER — Encounter (HOSPITAL_COMMUNITY)
Admission: RE | Admit: 2017-08-18 | Discharge: 2017-08-18 | Disposition: A | Discharge status: Home / Self Care | Payer: Medicare Other | Source: Ambulatory Visit | Attending: Cardiology | Admitting: Cardiology

## 2017-08-18 DIAGNOSIS — Z9889 Other specified postprocedural states: Secondary | ICD-10-CM

## 2017-08-18 DIAGNOSIS — C3491 Malignant neoplasm of unspecified part of right bronchus or lung: Secondary | ICD-10-CM | POA: Diagnosis not present

## 2017-08-18 DIAGNOSIS — Z951 Presence of aortocoronary bypass graft: Secondary | ICD-10-CM

## 2017-08-20 ENCOUNTER — Encounter (HOSPITAL_COMMUNITY): Payer: Self-pay

## 2017-08-20 ENCOUNTER — Encounter (HOSPITAL_COMMUNITY)
Admission: RE | Admit: 2017-08-20 | Discharge: 2017-08-20 | Disposition: A | Discharge status: Home / Self Care | Payer: Medicare Other | Source: Ambulatory Visit | Attending: Cardiology | Admitting: Cardiology

## 2017-08-20 DIAGNOSIS — Z951 Presence of aortocoronary bypass graft: Secondary | ICD-10-CM

## 2017-08-20 DIAGNOSIS — C3491 Malignant neoplasm of unspecified part of right bronchus or lung: Secondary | ICD-10-CM | POA: Diagnosis not present

## 2017-08-20 DIAGNOSIS — Z9889 Other specified postprocedural states: Secondary | ICD-10-CM

## 2017-08-22 NOTE — Progress Notes (Signed)
Cardiology Office Note    Date:  08/24/2017   ID:  James [name], DOB [birth year], MRN [id number]  PCP:  [name], MD  Cardiologist:  [name]   Chief Complaint  Patient presents with  . Shortness of Breath    states with activity     History of Present Illness:  James [name] is a 67 y.o. male with PMH of DM II, HLD, and severe MR. he was originally noted to have a loud murmur by his primary care physician.  Initial echocardiogram suggested mild MR, however it also suggested partially flail leaflet.  MR was thought to be underestimated.  TEE showed a flail P3 segment with ruptured cord and a severe MR.  Cardiac catheterization performed on 08/11/2016 showed 65% mid to distal LAD lesion, FFR borderline at 0.81, EF 65%.  Normal cardiac output and RV/LV filling pressure.  He had a PET scan on 08/17/2016 that showed a hypermetabolic solid 2.6 cm anterior right upper lobe pulmonary nodule compatible with prior bronchogenic, no hypermetabolic thoracic lymphadenopathy or distal metastatic disease.  There were nonspecific and mild asymmetric hypermetabolism in the left pontine tonsil and the left tongue base region.  CT surgery recommended lobectomy for his lung mass with possible CABG with LIMA to LAD and MV repair.  He underwent right upper lobe lobectomy in April.  Pathology positive for adenocarcinoma stage Ia.  Serial CT scan was recommended by oncology.  Post procedure, he became very short of breath.  CTA was positive for PE, he was started on Xarelto.  Repeat echocardiogram obtained on 02/15/2017 showed EF 60-65%, persistent MR.  He eventually underwent mitral valve repair with Sorin Carbomedics Annuloglex size 28, LIMA to LAD by Dr. Roxy Manns. He was DC on coumadin instead of Xarelto.   On follow up today he is making progress. He has completed Cardiac Rehab. There has been some improvement in his breathing but he is still SOB and some days this can be limiting. He is only taking lasix twice a  week. He has some edema. No cough. Mild sternal discomfort. Is up to walking 2-2.5 miles.   Past Medical History:  Diagnosis Date  . Adenocarcinoma of right lung, stage 1 ([place]) 09/06/2016  . Anxiety   . Arthritis    "knees, hips, back" (10/19/2012)  . Chronic diastolic congestive heart failure ([place])   . Chronic lower back pain   . Colon polyps    10/27/2002, repeat letter 09/17/2007  . Coronary artery disease   . Coronary artery disease involving native coronary artery of native heart without angina pectoris   . Depressive disorder, not elsewhere classified    no meds  . Diabetes mellitus without complication ([place])    diet controlled- no med  (while in hosp 4/18 -elevated cbg  . Dyspnea   . Fasting hyperglycemia   . GERD (gastroesophageal reflux disease)   . Heart murmur   . Hemoptysis    abnormal CT Chest 01/29/10 - ? new GG changes RUL > not viz on plain cxr 02/26/2010  . Hypertension   . Mitral regurgitation    severe MR 08/2016  . MPN (myeloproliferative neoplasm) ([place])    1st detected 06/04/1998  . Obesity   . OSA on CPAP    last sleep study 10 years ago  . Other and unspecified hyperlipidemia   . Peripheral vascular disease ([place]) 08/2016   after lung surgey small clots in lungs,after hip dvt-5/16  . Pneumonia    4/18  .  Positive PPD 1965   "non reactive in 2012" (10/19/2012)  . Routine general medical examination at a health care facility   . S/P CABG x 1 03/24/2017   LIMA to LAD  . S/P mitral valve repair 03/24/2017   Complex valvuloplasty including artificial Gore-tex neochord placement x6 and 28 mm Sorin Annuloflex posterior annuloplasty band  . Special screening for malignant neoplasm of prostate   . Spinal stenosis, unspecified region other than cervical   . Wrist pain, left     Past Surgical History:  Procedure Laterality Date  . ANTERIOR CRUCIATE LIGAMENT REPAIR Left 1967  . CARDIAC CATHETERIZATION  2000  . CHEST TUBE INSERTION Right 10/19/2012   post bronch    . COLONOSCOPY W/ POLYPECTOMY    . CORONARY ARTERY BYPASS GRAFT N/A 03/24/2017   Procedure: CORONARY ARTERY BYPASS GRAFTING (CABG)x1 using left internal mammary artery, LIMA-LAD;  Surgeon: Rexene Alberts, MD;  Location: [facility];  Service: Open Heart Surgery;  Laterality: N/A;  . FLEXIBLE BRONCHOSCOPY  10/19/2012   Flexible video fiberoptic bronchoscopy with electromagnetic navigation and biopsies.  . INTRAVASCULAR PRESSURE WIRE/FFR STUDY N/A 08/11/2016   Procedure: Intravascular Pressure Wire/FFR Study;  Surgeon: Antoninette Lerner M Martinique, MD;  Location: [facility];  Service: Cardiovascular;  Laterality: N/A;  . LOBECTOMY Right 09/02/2016   Procedure: RIGHT UPPER LOBECTOMY;  Surgeon: Melrose Nakayama, MD;  Location: [facility];  Service: Thoracic;  Laterality: Right;  . LYMPH NODE DISSECTION Right 09/02/2016   Procedure: LYMPH NODE DISSECTION, RIGHT LUNG;  Surgeon: Melrose Nakayama, MD;  Location: [facility];  Service: Thoracic;  Laterality: Right;  . MITRAL VALVE REPAIR N/A 03/24/2017   Procedure: MITRAL VALVE REPAIR (MVR) with Sorin Carbomedics Annuloflex size 28;  Surgeon: Rexene Alberts, MD;  Location: [facility];  Service: Open Heart Surgery;  Laterality: N/A;  . RIGHT/LEFT HEART CATH AND CORONARY ANGIOGRAPHY N/A 08/11/2016   Procedure: Right/Left Heart Cath and Coronary Angiography;  Surgeon: Treyana Sturgell M Martinique, MD;  Location: [facility];  Service: Cardiovascular;  Laterality: N/A;  . TEE WITHOUT CARDIOVERSION N/A 07/17/2016   Procedure: TRANSESOPHAGEAL ECHOCARDIOGRAM (TEE);  Surgeon: [name], MD;  Location: [facility];  Service: Cardiovascular;  Laterality: N/A;  . TEE WITHOUT CARDIOVERSION N/A 03/24/2017   Procedure: TRANSESOPHAGEAL ECHOCARDIOGRAM (TEE);  Surgeon: Rexene Alberts, MD;  Location: [facility];  Service: Open Heart Surgery;  Laterality: N/A;  . TONSILLECTOMY  1950's  . TOTAL HIP ARTHROPLASTY Left 10/05/2014   dr [name]  . TOTAL HIP ARTHROPLASTY Left 10/05/2014   Procedure: LEFT  TOTAL HIP ARTHROPLASTY ANTERIOR APPROACH;  Surgeon: [name], MD;  Location: [facility];  Service: Orthopedics;  Laterality: Left;  Marland Kitchen VIDEO ASSISTED THORACOSCOPY (VATS)/WEDGE RESECTION Right 09/02/2016   Procedure: VIDEO ASSISTED THORACOSCOPY (VATS)/RIGHT UPPER LOBE WEDGE RESECTION;  Surgeon: Melrose Nakayama, MD;  Location: [facility];  Service: Thoracic;  Laterality: Right;  Marland Kitchen VIDEO BRONCHOSCOPY WITH ENDOBRONCHIAL NAVIGATION N/A 10/19/2012   Procedure: VIDEO BRONCHOSCOPY WITH ENDOBRONCHIAL NAVIGATION;  Surgeon: [name], MD;  Location: [facility];  Service: Thoracic;  Laterality: N/A;  . WRIST RECONSTRUCTION Left 12/2009   'proximal row carpectomy" [name]    Current Medications: Outpatient Medications Prior to Visit  Medication Sig Dispense Refill  . acetaminophen (TYLENOL) 325 MG tablet Take 650 mg by mouth every 6 (six) hours as needed for moderate pain or headache.    Marland Kitchen aspirin EC 81 MG tablet Take 1 tablet (81 mg total) by mouth daily. 90 tablet 3  .  atorvastatin (LIPITOR) 20 MG tablet Take 1 tablet (20 mg total) by mouth daily. 90 tablet 3  . metoprolol tartrate (LOPRESSOR) 25 MG tablet TAKE 1/2 TABLET BY MOUTH 2 TIMES DAILY 30 tablet 11  . Omega-3 Fatty Acids (FISH OIL) 500 MG CAPS Take 500 mg by mouth daily.     . potassium chloride SA (K-DUR,KLOR-CON) 20 MEQ tablet Take 1 tablet (20 mEq total) by mouth daily as needed. With lasix 90 tablet 0  . traMADol (ULTRAM) 50 MG tablet Take 1 tablet (50 mg total) by mouth every 6 (six) hours as needed (may take one or two tablets every six hrs prn). 30 tablet 0  . furosemide (LASIX) 40 MG tablet Take 1 tablet (40 mg total) by mouth daily as needed. (Patient taking differently: Take 40 mg by mouth daily as needed (taking 2-3 times weekly). ) 90 tablet 0   No facility-administered medications prior to visit.      Allergies:   Symbicort [budesonide-formoterol fumarate] and Amoxicillin   Social History   Socioeconomic History  . Marital status:  Married    Spouse name: Not on file  . Number of children: 3  . Years of education: Not on file  . Highest education level: Not on file  Occupational History  . Occupation: Retired, Financial risk analyst  . Occupation: Retired, Real estate    Comment: [employer]  . Financial resource strain: Not on file  . Food insecurity:    Worry: Not on file    Inability: Not on file  . Transportation needs:    Medical: Not on file    Non-medical: Not on file  Tobacco Use  . Smoking status: Never Smoker  . Smokeless tobacco: Never Used  Substance and Sexual Activity  . Alcohol use: Yes    Alcohol/week: 0.6 oz    Types: 1 Cans of beer per week    Comment: none since 3 weeks  . Drug use: No  . Sexual activity: Never    Partners: Female  Lifestyle  . Physical activity:    Days per week: Not on file    Minutes per session: Not on file  . Stress: Not on file  Relationships  . Social connections:    Talks on phone: Not on file    Gets together: Not on file    Attends religious service: Not on file    Active member of club or organization: Not on file    Attends meetings of clubs or organizations: Not on file    Relationship status: Not on file  Other Topics Concern  . Not on file  Social History Narrative   HSG, [employer]. Married '73.  2 sons - '74,   '76;  1 daughter -  '77  6 grandchildren.   Work - IT consultant now retired. ACP - not fully discussed.                     Family History:  The patient's family history includes Heart attack in his father; Heart disease in his father and paternal uncle; Heart failure in his mother.   ROS:   Please see the history of present illness.    ROS All other systems reviewed and are negative.   PHYSICAL EXAM:   VS:  BP 140/80   Pulse 71   Ht 5\' 9"  (1.753 m)   Wt 227 lb 6.4 oz (103.1 kg)   SpO2 96%   BMI  33.58 kg/m    GEN: Well nourished, well developed, in no acute distress  HEENT: normal    Neck: no JVD, carotid bruits, or masses Cardiac: RRR; there is a gr 1/6 systolic murmur heart best in RUSB and the precordium. No  rubs, or gallops,1+ edema  Respiratory:  clear to auscultation bilaterally, normal work of breathing + decreased breath sounds in the right base GI: soft, nontender, nondistended, + BS MS: no deformity or atrophy  Skin: warm and dry, no rash Neuro:  Alert and Oriented x 3, Strength and sensation are intact Psych: euthymic mood, full affect  Wt Readings from Last 3 Encounters:  08/24/17 227 lb 6.4 oz (103.1 kg)  08/16/17 227 lb 15.3 oz (103.4 kg)  07/28/17 229 lb (103.9 kg)      Studies/Labs Reviewed:   EKG:  EKG is not ordered today.   Recent Labs: 02/08/2017: Brain Natriuretic Peptide 4 03/25/2017: Magnesium 2.3 07/28/2017: ALT 30; BUN 17; Creatinine, Ser 1.20; Hemoglobin 15.7; Platelets 209.0; Potassium 5.1; Pro B Natriuretic peptide (BNP) 48.0; Sodium 143; TSH 2.56   Lipid Panel    Component Value Date/Time   CHOL 177 05/27/2017 0807   TRIG 128 05/27/2017 0807   TRIG 108 05/20/2006 0825   HDL 41 05/27/2017 0807   CHOLHDL 4.3 05/27/2017 0807   CHOLHDL 7 06/29/2016 1153   VLDL 39.6 06/29/2016 1153   LDLCALC 110 (H) 05/27/2017 0807   LDLDIRECT 177.0 07/24/2015 1052    Additional studies/ records that were reviewed today include:    Cath 08/11/2016 Conclusion     Prox LAD to Mid LAD lesion, 30 %stenosed.  Mid LAD to Dist LAD lesion, 65 %stenosed.  Prox RCA to Mid RCA lesion, 15 %stenosed.  The left ventricular systolic function is normal.  LV end diastolic pressure is normal.  The left ventricular ejection fraction is 55-65% by visual estimate.  LV end diastolic pressure is normal.   1. Borderline single vessel obstructive CAD involving the mid LAD. FFR 0.81 2. Normal LV function EF 65% 3. Normal right heart and LV filling pressures 4. Normal Cardiac output  Plan: will refer to CT surgery for consideration of MV repair. The  stenosis in the LAD will be discussed. He is asymptomatic and I would favor treating it medically. If he develops symptoms in the future it could be treated with PCI.        Echo 02/15/2017 LV EF: 60% -   65%  Study Conclusions  - Left ventricle: The cavity size was normal. Wall thickness was   normal. Systolic function was normal. The estimated ejection   fraction was in the range of 60% to 65%. - Aortic valve: AV is thickened, calcified with minimally   restricted motion. - Mitral valve: Prolapse fo the posterior mitral leaflet. MR is at   least moderate in intensity Calcified annulus. Mildly thickened   leaflets . - Left atrium: The atrium was mildly dilated.    CABG with MVR 03/24/2017 Preoperative Diagnosis:        Severe Mitral Regurgitation  Single-vessel Coronary Artery Disease  Postoperative Diagnosis:    Same  Procedure:        Mitral Valve Repair             Complex valvuloplasty including artificial Gore-tex neochord placement x6             Sorin Carbomedics Annuloflex posterior annuloplasty band (size 9mm, catalog #[id number], serial #[id number])   Coronary Artery Bypass Grafting x 1  Left Internal Mammary Artery to Distal Left Anterior Descending Coronary Artery   Echo 06/10/17: Study Conclusions  - Left ventricle: The cavity size was normal. There was moderate   concentric hypertrophy. Systolic function was vigorous. The   estimated ejection fraction was in the range of 65% to 70%. Wall   motion was normal; there were no regional wall motion   abnormalities. Doppler parameters are consistent with abnormal   left ventricular relaxation (grade 1 diastolic dysfunction). - Aortic valve: Trileaflet; mildly thickened, mildly calcified   leaflets. Transvalvular velocity was minimally increased. There   was no stenosis. There was no regurgitation. - Mitral valve: S/P mitral valve repair with fixed posterior   leaflet. Transvalvular  velocity was elevated. There was no   regurgitation. - Right ventricle: The cavity size was normal. Wall thickness was   normal. Systolic function was normal. - Tricuspid valve: There was no regurgitation. - Pericardium, extracardiac: A mild pericardial effusion was   identified posterior to the heart. Features were not consistent   with tamponade physiology.  Impressions:  - Since the last study on 02/15/2017 mitral valve is post repair.   Transmitral velocities are elevated with mean gradient 8 mmHg.   LVEF is hyperdynamic.  ASSESSMENT:    1. S/P mitral valve repair   2. S/P CABG x 1   3. Chronic diastolic heart failure ([place])   4. Hyperlipidemia, unspecified hyperlipidemia type      PLAN:  In order of problems listed above:  1. Severe MR s/p mitral valve annuloplasty:  Echo showed an excellent repair.  Recommend routine SBE prophylaxis.  Continue to focus on exercise and weight loss.  2. Hyperlipidemia: Patient is reluctant to increase his statin. LDL now 107. Will repeat lipids later this year.   3. Chronic diastolic heart failure: weight stable but he does have some edema. I have recommended he take lasix 20 mg daily and we will see if this helps with his breathing.   4. S/p CABG x 1: LIMA to LAD  5. DM 2: Managed by primary care provider  6. History of PE on Coumadin June 2018: he has completed 6 months of anticoagulation for provoked PE. OffCoumadin now. Continue ASA  7. Lung CA s/p resection.    Medication Adjustments/Labs and Tests Ordered: Current medicines are reviewed at length with the patient today.  Concerns regarding medicines are outlined above.  Medication changes, Labs and Tests ordered today are listed in the Patient Instructions below. Patient Instructions  Try taking lasix 20 mg daily  Continue your other therapy      Signed, Ernesto Zukowski Martinique, MD  08/24/2017 3:19 PM    [facility] [address], [place], [place]   [address] Phone: [phone]; Fax: [phone]

## 2017-08-23 ENCOUNTER — Encounter (HOSPITAL_COMMUNITY): Payer: Medicare Other

## 2017-08-24 ENCOUNTER — Encounter: Payer: Self-pay | Admitting: Cardiology

## 2017-08-24 ENCOUNTER — Ambulatory Visit: Payer: Medicare Other | Admitting: Cardiology

## 2017-08-24 VITALS — BP 140/80 | HR 71 | Ht 69.0 in | Wt 227.4 lb

## 2017-08-24 DIAGNOSIS — E785 Hyperlipidemia, unspecified: Secondary | ICD-10-CM

## 2017-08-24 DIAGNOSIS — I5032 Chronic diastolic (congestive) heart failure: Secondary | ICD-10-CM | POA: Diagnosis not present

## 2017-08-24 DIAGNOSIS — Z951 Presence of aortocoronary bypass graft: Secondary | ICD-10-CM | POA: Diagnosis not present

## 2017-08-24 DIAGNOSIS — Z9889 Other specified postprocedural states: Secondary | ICD-10-CM

## 2017-08-24 MED ORDER — FUROSEMIDE 20 MG PO TABS: 20.0000 mg | ORAL_TABLET | Freq: Every day | ORAL | 3 refills | Status: DC

## 2017-08-24 NOTE — Patient Instructions (Signed)
Try taking lasix 20 mg daily  Continue your other therapy

## 2017-08-25 ENCOUNTER — Encounter (HOSPITAL_COMMUNITY): Payer: Medicare Other

## 2017-08-26 NOTE — Progress Notes (Signed)
Discharge Progress Report  Patient Details  Name: James [name] MRN: [id number] Date of Birth: 1950/07/14 Referring Provider:   Flowsheet Row CARDIAC REHAB PHASE II ORIENTATION from 05/18/2017 in [place]  Referring Provider  Martinique, Peter MD       Number of Visits: 36   Reason for Discharge:  Patient has met program and personal goals.  Smoking History:  Social History   Tobacco Use  Smoking Status Never Smoker  Smokeless Tobacco Never Used    Diagnosis:  S/P CABG x 1  S/P MVR (mitral valve repair)  ADL [facility]:   Initial Exercise Prescription: Initial Exercise Prescription - 05/18/17 1500    Date of Initial Exercise RX and Referring Provider          Date  05/18/17    Referring Provider  Martinique, Peter MD        NuStep          Level  2    Minutes  15    METs  2        Track          Laps  10    Minutes  15    METs  2.16        Prescription Details          Frequency (times per week)  3    Duration  Progress to 30 minutes of continuous aerobic without signs/symptoms of physical distress        Intensity          THRR 40-80% of Max Heartrate  61-122    Ratings of Perceived Exertion  11-15    Perceived Dyspnea  0-4        Progression          Progression  Continue to progress workloads to maintain intensity without signs/symptoms of physical distress.        Resistance Training          Training Prescription  Yes    Weight  3lbs    Reps  10-15           Discharge Exercise Prescription (Final Exercise Prescription Changes): Exercise Prescription Changes - 08/20/17 1209    Response to Exercise          Blood Pressure (Admit)  130/82    Blood Pressure (Exercise)  142/60    Blood Pressure (Exit)  130/80    Heart Rate (Admit)  96 bpm    Heart Rate (Exercise)  133 bpm    Heart Rate (Exit)  95 bpm    Oxygen Saturation (Admit)  95 %    Oxygen Saturation (Exercise)  95 %    Rating of Perceived Exertion  (Exercise)  12    Perceived Dyspnea (Exercise)  2    Duration  Continue with 30 min of aerobic exercise without signs/symptoms of physical distress.    Intensity  THRR unchanged        Progression          Progression  Continue to progress workloads to maintain intensity without signs/symptoms of physical distress.    Average METs  3.3        Resistance Training          Training Prescription  -- relaxation day        Interval Training          Interval Training  No        NuStep  Level  5    Minutes  15    METs  4.2        Track          Laps  12    Minutes  15    METs  2.39        Home Exercise Plan          Plans to continue exercise at  Home (comment)    Frequency  Add 2 additional days to program exercise sessions.    Initial Home Exercises Provided  06/02/17           Functional Capacity: 6 Minute Walk    6 Minute Walk    Row Name 05/18/17 1525 08/16/17 1528   Phase  Initial  Discharge   Distance  1276 feet  1600 feet   Distance % Change  no documentation  25.39 %   Distance Feet Change  no documentation  324 ft   Walk Time  6 minutes  6 minutes   # of Rest Breaks  0  0   MPH  2.4  3.03   METS  2.8  3.5   RPE  17  13   Perceived Dyspnea   3  4   VO2 Peak  no documentation  12.34   Symptoms  Yes (comment)  Yes (comment)   Comments  SOB  SOB   Resting HR  80 bpm  75 bpm   Resting BP  140/81  126/80   Resting Oxygen Saturation   98 %  95 %   Exercise Oxygen Saturation  during 6 min walk  91 %  90 %   Max Ex. HR  115 bpm  126 bpm   Max Ex. BP  156/92  154/82   2 Minute Post BP  142/84  130/82          Psychological, QOL, Others - Outcomes: PHQ 2/9: Depression screen [facility] 2/9 08/20/2017 07/30/2017 05/24/2017 02/02/2017 10/19/2016  Decreased Interest 1 0 0 0 1  Down, Depressed, Hopeless 1 0 1 0 3  PHQ - 2 Score 2 0 1 0 4  Altered sleeping 3 - - - 3  Tired, decreased energy 3 - - - 3  Change in appetite 2 - - - 3  Feeling bad or  failure about yourself  0 - - - 0  Trouble concentrating 1 - - - 0  Moving slowly or fidgety/restless 0 - - - 0  Suicidal thoughts 0 - - - 0  PHQ-9 Score 11 - - - 13  Difficult doing work/chores Somewhat difficult - - - Not difficult at all  Some recent data might be hidden    Quality of Life: Quality of Life - 08/19/17 1125    Quality of Life Scores          Health/Function Pre  17.23 %    Health/Function Post  24.4 %    Health/Function % Change  41.61 %    Socioeconomic Pre  25.44 %    Socioeconomic Post  25.93 %    Socioeconomic % Change   1.93 %    Psych/Spiritual Pre  24 %    Psych/Spiritual Post  25.5 %    Psych/Spiritual % Change  6.25 %    Family Pre  27.6 %    Family Post  28.8 %    Family % Change  4.35 %    GLOBAL Pre  21.94 %    GLOBAL Post  25.59 %    GLOBAL % Change  16.64 %           Personal Goals: Goals established at orientation with interventions provided to work toward goal. Personal Goals and Risk Factors at Admission - 05/18/17 1403    Core Components/Risk Factors/Patient Goals on Admission           Weight Management  Obesity    Improve shortness of breath with ADL's  Yes    Intervention  Provide education, individualized exercise plan and daily activity instruction to help decrease symptoms of SOB with activities of daily living.    Expected Outcomes  Short Term: Achieves a reduction of symptoms when performing activities of daily living.    Develop more efficient breathing techniques such as purse lipped breathing and diaphragmatic breathing; and practicing self-pacing with activity  Yes    Intervention  Provide education, demonstration and support about specific breathing techniuqes utilized for more efficient breathing. Include techniques such as pursed lipped breathing, diaphragmatic breathing and self-pacing activity.    Expected Outcomes  Short Term: Participant will be able to demonstrate and use breathing techniques as needed throughout  daily activities.    Diabetes  Yes    Intervention  Provide education about signs/symptoms and action to take for hypo/hyperglycemia.;Provide education about proper nutrition, including hydration, and aerobic/resistive exercise prescription along with prescribed medications to achieve blood glucose in normal ranges: Fasting glucose 65-99 mg/dL    Expected Outcomes  Short Term: Participant verbalizes understanding of the signs/symptoms and immediate care of hyper/hypoglycemia, proper foot care and importance of medication, aerobic/resistive exercise and nutrition plan for blood glucose control.;Long Term: Attainment of HbA1C < 7%.    Heart Failure  Yes    Intervention  Provide a combined exercise and nutrition program that is supplemented with education, support and counseling about heart failure. Directed toward relieving symptoms such as shortness of breath, decreased exercise tolerance, and extremity edema.    Expected Outcomes  Improve functional capacity of life;Short term: Attendance in program 2-3 days a week with increased exercise capacity. Reported lower sodium intake. Reported increased fruit and vegetable intake. Reports medication compliance.;Short term: Daily weights obtained and reported for increase. Utilizing diuretic protocols set by physician.;Long term: Adoption of self-care skills and reduction of barriers for early signs and symptoms recognition and intervention leading to self-care maintenance.    Lipids  Yes    Intervention  Provide education and support for participant on nutrition & aerobic/resistive exercise along with prescribed medications to achieve LDL <20m, HDL >460m    Expected Outcomes  Short Term: Participant states understanding of desired cholesterol values and is compliant with medications prescribed. Participant is following exercise prescription and nutrition guidelines.;Long Term: Cholesterol controlled with medications as prescribed, with individualized exercise RX  and with personalized nutrition plan. Value goals: LDL < 7061mHDL > 40 mg.            Personal Goals Discharge: Goals and Risk Factor Review    Core Components/Risk Factors/Patient Goals Review    Row Name 05/24/17 1534 05/28/17 1629 07/01/17 1054 07/20/17 1451 08/20/17 1523   Personal Goals Review  Weight Management/Obesity;Diabetes;Heart Failure  Weight Management/Obesity;Diabetes;Heart Failure  Weight Management/Obesity;Diabetes;Heart Failure  Weight Management/Obesity;Diabetes;Heart Failure  Weight Management/Obesity;Diabetes;Heart Failure   Review  pt with multiple CAD RF demonstrates eagerness to participate in CR program.  pt newly diagnosed diet controlled diabetic. pt instructed to participate in Diabetes Q&A education opporutunity.    pt with multiple CAD RF demonstrates eagerness to  participate in CR program.  pt pleased he is able to walk up 14 steps without dyspnea and is hoping to further increase funcational ability. pt newly diagnosed diet controlled diabetic. pt participated in Diabetes Q&A education opporutunity.    pt with multiple CAD RF demonstrates eagerness to participate in CR program.  pt pleased he is able to further  without dyspnea and  continues to further increase funcational ability. pt newly diagnosed diet controlled diabetic. pt participated in Diabetes Q&A education opporutunity.    pt with multiple CAD RF demonstrates eagerness to participate in CR program.  pt newly diagnosed diet controlled diabetic. pt reports DOE unchanged, however reports he is able to walk 3 miles in 1 hour without dyspnea.  DOE worse with harder than usual exertion, climbing uphill and carrying heavy objects.      pt DOE persistent. pt encouraged to discuss symptoms with [name] and [name]. pt counseled in proper PLB technique and encouraged to use.  pt educated on temperature precautions. pt would like to retunn to pulmonary rehab program in August, 2019   Expected Outcomes  pt will  participate in CR exercise, nutrition and education opportunities to improve overall RF.    pt will participate in CR exercise, nutrition and education opportunities to improve overall RF.    pt will participate in CR exercise, nutrition and education opportunities to improve overall RF.    pt will participate in CR exercise, nutrition and education opportunities to improve overall RF.    pt will participate in exercise, nutrition and education opportunities to improve overall RF.            Exercise Goals and Review: Exercise Goals    Exercise Goals    Row Name 05/18/17 1359   Increase Physical Activity  Yes   Intervention  Provide advice, education, support and counseling about physical activity/exercise needs.;Develop an individualized exercise prescription for aerobic and resistive training based on initial evaluation findings, risk stratification, comorbidities and participant's personal goals.   Expected Outcomes  Achievement of increased cardiorespiratory fitness and enhanced flexibility, muscular endurance and strength shown through measurements of functional capacity and personal statement of participant.   Increase Strength and Stamina  Yes   Intervention  Provide advice, education, support and counseling about physical activity/exercise needs.;Develop an individualized exercise prescription for aerobic and resistive training based on initial evaluation findings, risk stratification, comorbidities and participant's personal goals.   Expected Outcomes  Achievement of increased cardiorespiratory fitness and enhanced flexibility, muscular endurance and strength shown through measurements of functional capacity and personal statement of participant.   Able to understand and use rate of perceived exertion (RPE) scale  Yes   Intervention  Provide education and explanation on how to use RPE scale   Expected Outcomes  Short Term: Able to use RPE daily in rehab to express subjective intensity  level;Long Term:  Able to use RPE to guide intensity level when exercising independently   Knowledge and understanding of Target Heart Rate Range (THRR)  Yes   Intervention  Provide education and explanation of THRR including how the numbers were predicted and where they are located for reference   Expected Outcomes  Short Term: Able to state/look up THRR;Long Term: Able to use THRR to govern intensity when exercising independently;Short Term: Able to use daily as guideline for intensity in rehab   Able to check pulse independently  Yes   Intervention  Provide education and demonstration on how to check pulse in carotid and  radial arteries.;Review the importance of being able to check your own pulse for safety during independent exercise   Expected Outcomes  Short Term: Able to explain why pulse checking is important during independent exercise;Long Term: Able to check pulse independently and accurately   Understanding of Exercise Prescription  Yes   Intervention  Provide education, explanation, and written materials on patient's individual exercise prescription   Expected Outcomes  Short Term: Able to explain program exercise prescription;Long Term: Able to explain home exercise prescription to exercise independently          Nutrition & Weight - Outcomes: Pre Biometrics - 05/18/17 1531    Pre Biometrics          Height  5' 7"  (1.702 m)    Weight  225 lb 5 oz (102.2 kg)    Waist Circumference  46 inches    Hip Circumference  46.5 inches    Waist to Hip Ratio  0.99 %    BMI (Calculated)  35.28    Triceps Skinfold  15 mm    % Body Fat  33.4 %    Grip Strength  36 kg    Flexibility  13 in    Single Leg Stand  30 seconds          Post Biometrics - 08/16/17 1534     Post  Biometrics          Height  5' 7"  (1.702 m)    Weight  227 lb 15.3 oz (103.4 kg)    Waist Circumference  46 inches    Hip Circumference  45.5 inches    Waist to Hip Ratio  1.01 %    BMI (Calculated)  35.69     Triceps Skinfold  15 mm    % Body Fat  33.5 %    Grip Strength  47 kg    Flexibility  12.5 in    Single Leg Stand  30 seconds           Nutrition: Nutrition Therapy & Goals - 05/19/17 0913    Nutrition Therapy          Diet  Carb Modified, Heart Healthy        Personal Nutrition Goals          Nutrition Goal  Pt to be able to name foods that affect blood sugar.     Personal Goal #2  Pt to identify food quantities necessary to achieve weight loss of 6-14 lb  at graduation from cardiac rehab. Goal wt of 210 lb desired.         Intervention Plan          Intervention  Prescribe, educate and counsel regarding individualized specific dietary modifications aiming towards targeted core components such as weight, hypertension, lipid management, diabetes, heart failure and other comorbidities.    Expected Outcomes  Short Term Goal: Understand basic principles of dietary content, such as calories, fat, sodium, cholesterol and nutrients.;Long Term Goal: Adherence to prescribed nutrition plan.           Nutrition Discharge: Nutrition Assessments - 05/19/17 0913    MEDFICTS Scores          Pre Score  29           Education Questionnaire Score: Knowledge Questionnaire Score - 08/16/17 1618    Knowledge Questionnaire Score          Post Score  24/24           Goals  reviewed with patient; copy given to patient.

## 2017-08-30 ENCOUNTER — Encounter (HOSPITAL_COMMUNITY): Payer: Medicare Other

## 2017-09-01 ENCOUNTER — Encounter (HOSPITAL_COMMUNITY): Payer: Medicare Other

## 2017-09-06 ENCOUNTER — Encounter (HOSPITAL_COMMUNITY): Admission: RE | Admit: 2017-09-06 | Payer: Medicare Other | Source: Ambulatory Visit

## 2017-09-08 ENCOUNTER — Encounter (HOSPITAL_COMMUNITY): Payer: Medicare Other

## 2017-09-09 ENCOUNTER — Other Ambulatory Visit: Payer: Self-pay | Admitting: Internal Medicine

## 2017-09-09 DIAGNOSIS — E785 Hyperlipidemia, unspecified: Secondary | ICD-10-CM

## 2017-09-13 ENCOUNTER — Encounter (HOSPITAL_COMMUNITY): Payer: Medicare Other

## 2017-09-14 LAB — HM DIABETES EYE EXAM

## 2017-09-15 ENCOUNTER — Encounter (HOSPITAL_COMMUNITY): Payer: Medicare Other

## 2017-09-20 ENCOUNTER — Encounter (HOSPITAL_COMMUNITY): Payer: Medicare Other

## 2017-09-22 ENCOUNTER — Encounter (HOSPITAL_COMMUNITY): Payer: Medicare Other

## 2017-09-27 ENCOUNTER — Encounter: Payer: Self-pay | Admitting: Internal Medicine

## 2017-09-30 ENCOUNTER — Other Ambulatory Visit: Payer: Self-pay | Admitting: *Deleted

## 2017-09-30 DIAGNOSIS — C3490 Malignant neoplasm of unspecified part of unspecified bronchus or lung: Secondary | ICD-10-CM

## 2017-10-01 ENCOUNTER — Inpatient Hospital Stay: Payer: Medicare Other | Attending: Internal Medicine

## 2017-10-01 ENCOUNTER — Encounter (HOSPITAL_COMMUNITY): Payer: Self-pay

## 2017-10-01 ENCOUNTER — Ambulatory Visit (HOSPITAL_COMMUNITY)
Admission: RE | Admit: 2017-10-01 | Discharge: 2017-10-01 | Disposition: A | Discharge status: Home / Self Care | Payer: Medicare Other | Source: Ambulatory Visit | Attending: Internal Medicine | Admitting: Internal Medicine

## 2017-10-01 DIAGNOSIS — C3490 Malignant neoplasm of unspecified part of unspecified bronchus or lung: Secondary | ICD-10-CM | POA: Diagnosis present

## 2017-10-01 DIAGNOSIS — R918 Other nonspecific abnormal finding of lung field: Secondary | ICD-10-CM | POA: Insufficient documentation

## 2017-10-01 DIAGNOSIS — I700 Atherosclerosis of aorta: Secondary | ICD-10-CM | POA: Insufficient documentation

## 2017-10-01 DIAGNOSIS — I10 Essential (primary) hypertension: Secondary | ICD-10-CM | POA: Insufficient documentation

## 2017-10-01 DIAGNOSIS — Z85118 Personal history of other malignant neoplasm of bronchus and lung: Secondary | ICD-10-CM | POA: Diagnosis not present

## 2017-10-01 DIAGNOSIS — J90 Pleural effusion, not elsewhere classified: Secondary | ICD-10-CM | POA: Insufficient documentation

## 2017-10-01 LAB — CBC WITH DIFFERENTIAL (CANCER CENTER ONLY)
Basophils Absolute: 0 10*3/uL (ref 0.0–0.1)
Basophils Relative: 1 %
Eosinophils Absolute: 0.2 10*3/uL (ref 0.0–0.5)
Eosinophils Relative: 3 %
HCT: 46.8 % (ref 38.4–49.9)
Hemoglobin: 15.9 g/dL (ref 13.0–17.1)
Lymphocytes Relative: 21 %
Lymphs Abs: 1.4 10*3/uL (ref 0.9–3.3)
MCH: 29.8 pg (ref 27.2–33.4)
MCHC: 34 g/dL (ref 32.0–36.0)
MCV: 87.8 fL (ref 79.3–98.0)
Monocytes Absolute: 0.5 10*3/uL (ref 0.1–0.9)
Monocytes Relative: 7 %
Neutro Abs: 4.5 10*3/uL (ref 1.5–6.5)
Neutrophils Relative %: 68 %
Platelet Count: 201 10*3/uL (ref 140–400)
RBC: 5.33 MIL/uL (ref 4.20–5.82)
RDW: 14.8 % — ABNORMAL HIGH (ref 11.0–14.6)
WBC Count: 6.6 10*3/uL (ref 4.0–10.3)

## 2017-10-01 LAB — CMP (CANCER CENTER ONLY)
ALT: 37 U/L (ref 0–55)
AST: 24 U/L (ref 5–34)
Albumin: 4 g/dL (ref 3.5–5.0)
Alkaline Phosphatase: 70 U/L (ref 40–150)
Anion gap: 6 (ref 3–11)
BUN: 17 mg/dL (ref 7–26)
CO2: 25 mmol/L (ref 22–29)
Calcium: 9.5 mg/dL (ref 8.4–10.4)
Chloride: 108 mmol/L (ref 98–109)
Creatinine: 1.01 mg/dL (ref 0.70–1.30)
GFR, Est AFR Am: >60 mL/min (ref >60)
GFR, Estimated: >60 mL/min (ref >60)
Glucose, Bld: 119 mg/dL (ref 70–140)
Potassium: 4.2 mmol/L (ref 3.5–5.1)
Sodium: 139 mmol/L (ref 136–145)
Total Bilirubin: 0.6 mg/dL (ref 0.2–1.2)
Total Protein: 7.1 g/dL (ref 6.4–8.3)

## 2017-10-01 MED ORDER — IOHEXOL 300 MG/ML  SOLN
75.0000 mL | Freq: Once | INTRAMUSCULAR | Status: AC | PRN
  Administered 2017-10-01: 75 mL via INTRAVENOUS

## 2017-10-05 ENCOUNTER — Other Ambulatory Visit: Payer: Medicare Other

## 2017-10-07 ENCOUNTER — Inpatient Hospital Stay: Payer: Medicare Other | Admitting: Internal Medicine

## 2017-10-07 ENCOUNTER — Encounter: Payer: Self-pay | Admitting: Internal Medicine

## 2017-10-07 VITALS — BP 142/73 | HR 69 | Temp 98.9°F | Resp 17 | Ht 69.0 in | Wt 226.9 lb

## 2017-10-07 DIAGNOSIS — J90 Pleural effusion, not elsewhere classified: Secondary | ICD-10-CM | POA: Diagnosis not present

## 2017-10-07 DIAGNOSIS — C3491 Malignant neoplasm of unspecified part of right bronchus or lung: Secondary | ICD-10-CM

## 2017-10-07 DIAGNOSIS — C3490 Malignant neoplasm of unspecified part of unspecified bronchus or lung: Secondary | ICD-10-CM

## 2017-10-07 DIAGNOSIS — Z85118 Personal history of other malignant neoplasm of bronchus and lung: Secondary | ICD-10-CM

## 2017-10-07 DIAGNOSIS — I10 Essential (primary) hypertension: Secondary | ICD-10-CM | POA: Diagnosis not present

## 2017-10-07 MED ORDER — DOXYCYCLINE HYCLATE 100 MG PO TABS: 100.0000 mg | ORAL_TABLET | Freq: Two times a day (BID) | ORAL | 0 refills | Status: DC

## 2017-10-07 NOTE — Progress Notes (Signed)
[place] [phone]   [phone]) [phone]  OFFICE PROGRESS NOTE  [name], MD [address] [facility] [address] [place] [address]  DIAGNOSIS: Stage IA (T1c, N0, M0) non-small cell lung cancer, adenocarcinoma presented with right upper lobe lung nodule   PRIOR THERAPY: status post right upper lobectomy with lymph node dissection under the care of Dr. [name] on 09/02/2016.  CURRENT THERAPY: Observation.  INTERVAL HISTORY: James [name] 67 y.o. male returns to the clinic today for follow-up visit accompanied by his wife.  The patient is feeling fine today with no specific complaints except for more shortness of breath with exertion.  He denied having any chest pain, cough or hemoptysis.  He denied having any fever or chills.  He has no nausea, vomiting, diarrhea or constipation.  The patient denied having any recent weight loss or night sweats.  He had repeat CT scan of the chest performed recently and he is here for evaluation and discussion of his discuss results.  MEDICAL HISTORY: Past Medical History:  Diagnosis Date  . Adenocarcinoma of right lung, stage 1 ([place]) 09/06/2016  . Anxiety   . Arthritis    "knees, hips, back" (10/19/2012)  . Chronic diastolic congestive heart failure ([place])   . Chronic lower back pain   . Colon polyps    10/27/2002, repeat letter 09/17/2007  . Coronary artery disease   . Coronary artery disease involving native coronary artery of native heart without angina pectoris   . Depressive disorder, not elsewhere classified    no meds  . Diabetes mellitus without complication ([place])    diet controlled- no med  (while in hosp 4/18 -elevated cbg  . Dyspnea   . Fasting hyperglycemia   . GERD (gastroesophageal reflux disease)   . Heart murmur   . Hemoptysis    abnormal CT Chest 01/29/10 - ? new GG changes RUL > not viz on plain cxr 02/26/2010  . Hypertension   . Mitral regurgitation    severe MR 08/2016  . MPN  (myeloproliferative neoplasm) ([place])    1st detected 06/04/1998  . Obesity   . OSA on CPAP    last sleep study 10 years ago  . Other and unspecified hyperlipidemia   . Peripheral vascular disease ([place]) 08/2016   after lung surgey small clots in lungs,after hip dvt-5/16  . Pneumonia    4/18  . Positive PPD 1965   "non reactive in 2012" (10/19/2012)  . Routine general medical examination at a health care facility   . S/P CABG x 1 03/24/2017   LIMA to LAD  . S/P mitral valve repair 03/24/2017   Complex valvuloplasty including artificial Gore-tex neochord placement x6 and 28 mm Sorin Annuloflex posterior annuloplasty band  . Special screening for malignant neoplasm of prostate   . Spinal stenosis, unspecified region other than cervical   . Wrist pain, left     ALLERGIES:  is allergic to symbicort [budesonide-formoterol fumarate] and amoxicillin.  MEDICATIONS:  Current Outpatient Medications  Medication Sig Dispense Refill  . acetaminophen (TYLENOL) 325 MG tablet Take 650 mg by mouth every 6 (six) hours as needed for moderate pain or headache.    Marland Kitchen aspirin EC 81 MG tablet Take 1 tablet (81 mg total) by mouth daily. 90 tablet 3  . atorvastatin (LIPITOR) 20 MG tablet TAKE 1 TABLET (20 MG TOTAL) BY MOUTH DAILY. 90 tablet 0  . furosemide (LASIX) 20 MG tablet Take 1 tablet (20 mg  total) by mouth daily. 90 tablet 3  . metoprolol tartrate (LOPRESSOR) 25 MG tablet TAKE 1/2 TABLET BY MOUTH 2 TIMES DAILY 30 tablet 11  . Omega-3 Fatty Acids (FISH OIL) 500 MG CAPS Take 500 mg by mouth daily.     . potassium chloride SA (K-DUR,KLOR-CON) 20 MEQ tablet Take 1 tablet (20 mEq total) by mouth daily as needed. With lasix 90 tablet 0  . traMADol (ULTRAM) 50 MG tablet Take 1 tablet (50 mg total) by mouth every 6 (six) hours as needed (may take one or two tablets every six hrs prn). 30 tablet 0   No current facility-administered medications for this visit.     SURGICAL HISTORY:  Past Surgical History:    Procedure Laterality Date  . ANTERIOR CRUCIATE LIGAMENT REPAIR Left 1967  . CARDIAC CATHETERIZATION  2000  . CHEST TUBE INSERTION Right 10/19/2012   post bronch  . COLONOSCOPY W/ POLYPECTOMY    . CORONARY ARTERY BYPASS GRAFT N/A 03/24/2017   Procedure: CORONARY ARTERY BYPASS GRAFTING (CABG)x1 using left internal mammary artery, LIMA-LAD;  Surgeon: Rexene Alberts, MD;  Location: [facility];  Service: Open Heart Surgery;  Laterality: N/A;  . FLEXIBLE BRONCHOSCOPY  10/19/2012   Flexible video fiberoptic bronchoscopy with electromagnetic navigation and biopsies.  . INTRAVASCULAR PRESSURE WIRE/FFR STUDY N/A 08/11/2016   Procedure: Intravascular Pressure Wire/FFR Study;  Surgeon: [name], MD;  Location: [facility];  Service: Cardiovascular;  Laterality: N/A;  . LOBECTOMY Right 09/02/2016   Procedure: RIGHT UPPER LOBECTOMY;  Surgeon: Melrose Nakayama, MD;  Location: [facility];  Service: Thoracic;  Laterality: Right;  . LYMPH NODE DISSECTION Right 09/02/2016   Procedure: LYMPH NODE DISSECTION, RIGHT LUNG;  Surgeon: Melrose Nakayama, MD;  Location: [facility];  Service: Thoracic;  Laterality: Right;  . MITRAL VALVE REPAIR N/A 03/24/2017   Procedure: MITRAL VALVE REPAIR (MVR) with Sorin Carbomedics Annuloflex size 28;  Surgeon: Rexene Alberts, MD;  Location: [facility];  Service: Open Heart Surgery;  Laterality: N/A;  . RIGHT/LEFT HEART CATH AND CORONARY ANGIOGRAPHY N/A 08/11/2016   Procedure: Right/Left Heart Cath and Coronary Angiography;  Surgeon: [name], MD;  Location: [facility];  Service: Cardiovascular;  Laterality: N/A;  . TEE WITHOUT CARDIOVERSION N/A 07/17/2016   Procedure: TRANSESOPHAGEAL ECHOCARDIOGRAM (TEE);  Surgeon: [name], MD;  Location: [facility];  Service: Cardiovascular;  Laterality: N/A;  . TEE WITHOUT CARDIOVERSION N/A 03/24/2017   Procedure: TRANSESOPHAGEAL ECHOCARDIOGRAM (TEE);  Surgeon: Rexene Alberts, MD;  Location: [facility];  Service: Open Heart  Surgery;  Laterality: N/A;  . TONSILLECTOMY  1950's  . TOTAL HIP ARTHROPLASTY Left 10/05/2014   dr [name]  . TOTAL HIP ARTHROPLASTY Left 10/05/2014   Procedure: LEFT TOTAL HIP ARTHROPLASTY ANTERIOR APPROACH;  Surgeon: [name], MD;  Location: [facility];  Service: Orthopedics;  Laterality: Left;  Marland Kitchen VIDEO ASSISTED THORACOSCOPY (VATS)/WEDGE RESECTION Right 09/02/2016   Procedure: VIDEO ASSISTED THORACOSCOPY (VATS)/RIGHT UPPER LOBE WEDGE RESECTION;  Surgeon: Melrose Nakayama, MD;  Location: [facility];  Service: Thoracic;  Laterality: Right;  Marland Kitchen VIDEO BRONCHOSCOPY WITH ENDOBRONCHIAL NAVIGATION N/A 10/19/2012   Procedure: VIDEO BRONCHOSCOPY WITH ENDOBRONCHIAL NAVIGATION;  Surgeon: [name], MD;  Location: [facility];  Service: Thoracic;  Laterality: N/A;  . WRIST RECONSTRUCTION Left 12/2009   'proximal row carpectomy" [name]    REVIEW OF SYSTEMS:  A comprehensive review of systems was negative except for: Respiratory: positive for dyspnea on exertion   PHYSICAL EXAMINATION: General  appearance: alert, cooperative and no distress Head: Normocephalic, without obvious abnormality, atraumatic Neck: no adenopathy, no JVD, supple, symmetrical, trachea midline and thyroid not enlarged, symmetric, no tenderness/mass/nodules Lymph nodes: Cervical, supraclavicular, and axillary nodes normal. Resp: diminished breath sounds RLL and dullness to percussion RLL Back: symmetric, no curvature. ROM normal. No CVA tenderness. Cardio: regular rate and rhythm, S1, S2 normal, no murmur, click, rub or gallop GI: soft, non-tender; bowel sounds normal; no masses,  no organomegaly Extremities: extremities normal, atraumatic, no cyanosis or edema  ECOG PERFORMANCE STATUS: 1 - Symptomatic but completely ambulatory  Blood pressure (!) 142/73, pulse 69, temperature 98.9 F (37.2 C), temperature source Oral, resp. rate 17, height 5\' 9"  (1.753 m), weight 226 lb 14.4 oz (102.9 kg), SpO2 95 %.  LABORATORY DATA: Lab Results    Component Value Date   WBC 6.6 10/01/2017   HGB 15.9 10/01/2017   HCT 46.8 10/01/2017   MCV 87.8 10/01/2017   PLT 201 10/01/2017      Chemistry      Component Value Date/Time   NA 139 10/01/2017 1026   NA 142 10/01/2016 1534   K 4.2 10/01/2017 1026   K 4.2 10/01/2016 1534   CL 108 10/01/2017 1026   CO2 25 10/01/2017 1026   CO2 28 10/01/2016 1534   BUN 17 10/01/2017 1026   BUN 15.9 10/01/2016 1534   CREATININE 1.01 10/01/2017 1026   CREATININE 1.10 02/08/2017 1101   CREATININE 1.5 (H) 10/01/2016 1534      Component Value Date/Time   CALCIUM 9.5 10/01/2017 1026   CALCIUM 9.6 10/01/2016 1534   ALKPHOS 70 10/01/2017 1026   ALKPHOS 65 10/01/2016 1534   AST 24 10/01/2017 1026   AST 20 10/01/2016 1534   ALT 37 10/01/2017 1026   ALT 24 10/01/2016 1534   BILITOT 0.6 10/01/2017 1026   BILITOT 0.46 10/01/2016 1534       RADIOGRAPHIC STUDIES: Ct Chest W Contrast  Result Date: 10/01/2017 CLINICAL DATA:  Non-small-cell lung cancer. Status post right upper lobectomy. EXAM: CT CHEST WITH CONTRAST TECHNIQUE: Multidetector CT imaging of the chest was performed during intravenous contrast administration. CONTRAST:  75mL OMNIPAQUE IOHEXOL 300 MG/ML  SOLN COMPARISON:  03/22/2017 FINDINGS: Cardiovascular: The heart size is normal. No pericardial effusion. Coronary artery calcification is evident. Atherosclerotic calcification is noted in the wall of the thoracic aorta. Mediastinum/Nodes: No mediastinal lymphadenopathy. No left hilar lymphadenopathy. Similar appearance of soft tissue in the right hilum, most likely treatment related. The esophagus has normal imaging features. There is no axillary lymphadenopathy. Lungs/Pleura: The central tracheobronchial airways are patent. Surgical staple line identified anterior right lung. 6 mm right lung nodule identified on the previous study is stable today (image 63/series 5). 4 mm right lung nodule seen on image 69/5 is new in the interval. 2.6 x 1.6 cm  nodular opacity in the posterior right lung (image 84/series 5) has an appearance suggestive of collapse/consolidation on coronal and sagittal imaging, more so than a discrete soft tissue nodule. Moderate right pleural effusion with subpulmonic component is new in the interval. A new cluster of tree in bud nodularity with branching opacity suggesting airway impaction is seen in the medial left upper lobe (image 41/series 5. Nodular components to this disease measure up to 7 mm. No left pleural effusion. Upper Abdomen: Hepatic cysts again noted. Central sinus cysts versus mild hydronephrosis in the left kidney is similar to prior. Musculoskeletal: Bone windows reveal no worrisome lytic or sclerotic osseous lesions. IMPRESSION: 1. 2.6  x 1.6 cm nodular density in the posterior right lower lobe may be infectious/inflammatory based on coronal and sagittal imaging. However metastatic disease cannot be excluded. Small to moderate unilateral right pleural effusion also raises concern. 2. New 4 mm right lung nodule. 3. Cluster of tree in bud nodularity and airway impaction in the medial left upper lobe. Imaging features most suggestive of atypical infection. 4.  Aortic Atherosclerois (ICD10-170.0) Electronically Signed   By: Misty Stanley M.D.   On: 10/01/2017 15:33    ASSESSMENT AND PLAN: This is a very pleasant 67 years old white male with a stage Ia non-small cell lung cancer status post right upper lobectomy with lymph node dissection on September 02, 2016. The patient is currently on observation. He had repeat CT scan of the chest performed recently.  I personally and independently reviewed the scan images and discussed the results and showed the images to the patient and his wife today.  Unfortunately there are some concerning findings with nodular density in the posterior right lower lobe as well as increase and right pleural effusion suspicious for disease recurrence but inflammatory or infectious process could not  be excluded. I had a lengthy discussion with the patient today about his condition.  I recommended for him to proceed with ultrasound-guided right thoracentesis for diagnostic and therapeutic purposes.  If the pleural fluid cytology is positive for malignancy, I will see the patient sooner for evaluation and discussion of his treatment options. I will also start the patient on a course of doxycycline 100 mg p.o. twice daily for 2 weeks. I will schedule the patient to have repeat CT scan of the chest in 6 weeks for reevaluation of his disease and to rule out any disease recurrence. He was advised to call immediately if he has any concerning symptoms in the interval. The patient voices understanding of current disease status and treatment options and is in agreement with the current care plan.  All questions were answered. The patient knows to call the clinic with any problems, questions or concerns. We can certainly see the patient much sooner if necessary.  Disclaimer: This note was dictated with voice recognition software. Similar sounding words can inadvertently be transcribed and may not be corrected upon review.

## 2017-10-13 ENCOUNTER — Ambulatory Visit (HOSPITAL_COMMUNITY)
Admission: RE | Admit: 2017-10-13 | Discharge: 2017-10-13 | Disposition: A | Discharge status: Home / Self Care | Payer: Medicare Other | Source: Ambulatory Visit | Attending: Internal Medicine | Admitting: Internal Medicine

## 2017-10-13 ENCOUNTER — Ambulatory Visit (HOSPITAL_COMMUNITY)
Admission: RE | Admit: 2017-10-13 | Discharge: 2017-10-13 | Disposition: A | Discharge status: Home / Self Care | Payer: Medicare Other | Source: Ambulatory Visit | Attending: Radiology | Admitting: Radiology

## 2017-10-13 DIAGNOSIS — J90 Pleural effusion, not elsewhere classified: Secondary | ICD-10-CM | POA: Insufficient documentation

## 2017-10-13 DIAGNOSIS — Z9889 Other specified postprocedural states: Secondary | ICD-10-CM

## 2017-10-13 DIAGNOSIS — J9811 Atelectasis: Secondary | ICD-10-CM | POA: Diagnosis not present

## 2017-10-13 DIAGNOSIS — C3490 Malignant neoplasm of unspecified part of unspecified bronchus or lung: Secondary | ICD-10-CM | POA: Diagnosis present

## 2017-10-13 MED ORDER — LIDOCAINE HCL 1 % IJ SOLN
INTRAMUSCULAR | Status: AC
  Filled 2017-10-13: qty 20

## 2017-10-13 NOTE — Procedures (Signed)
Ultrasound-guided diagnostic and therapeutic right thoracentesis performed yielding 1.2 liters of hazy, amber fluid. No immediate complications. Follow-up chest x-ray pending.The fluid was sent to the lab for preordered studies.

## 2017-10-14 ENCOUNTER — Telehealth: Payer: Self-pay | Admitting: Medical Oncology

## 2017-10-14 NOTE — Telephone Encounter (Signed)
Per Julien Nordmann I asked pt to come to appt tomorrow to discuss test results. appt given to pt . Message sent to scheduler.

## 2017-10-15 ENCOUNTER — Telehealth: Payer: Self-pay | Admitting: Internal Medicine

## 2017-10-15 ENCOUNTER — Encounter: Payer: Self-pay | Admitting: Internal Medicine

## 2017-10-15 ENCOUNTER — Inpatient Hospital Stay: Payer: Medicare Other | Attending: Internal Medicine | Admitting: Internal Medicine

## 2017-10-15 ENCOUNTER — Inpatient Hospital Stay: Payer: Medicare Other

## 2017-10-15 DIAGNOSIS — I10 Essential (primary) hypertension: Secondary | ICD-10-CM | POA: Insufficient documentation

## 2017-10-15 DIAGNOSIS — C3491 Malignant neoplasm of unspecified part of right bronchus or lung: Secondary | ICD-10-CM | POA: Diagnosis not present

## 2017-10-15 DIAGNOSIS — E119 Type 2 diabetes mellitus without complications: Secondary | ICD-10-CM | POA: Insufficient documentation

## 2017-10-15 DIAGNOSIS — Z7189 Other specified counseling: Secondary | ICD-10-CM

## 2017-10-15 DIAGNOSIS — Z85118 Personal history of other malignant neoplasm of bronchus and lung: Secondary | ICD-10-CM | POA: Diagnosis not present

## 2017-10-15 DIAGNOSIS — J910 Malignant pleural effusion: Secondary | ICD-10-CM | POA: Diagnosis not present

## 2017-10-15 DIAGNOSIS — C3490 Malignant neoplasm of unspecified part of unspecified bronchus or lung: Secondary | ICD-10-CM

## 2017-10-15 NOTE — Telephone Encounter (Signed)
Scheduled appt per 6/7 los - gave patient AVS and calender per los.

## 2017-10-15 NOTE — Progress Notes (Signed)
James [name] [phone]) [phone]   [phone]  OFFICE PROGRESS NOTE  James Lima, MD [address] [facility] [address] [place] [address]  DIAGNOSIS: Recurrent non-small cell lung cancer, adenocarcinoma initially diagnosed as stage IA (T1c, N0, M0) non-small cell lung cancer, adenocarcinoma presented with right upper lobe lung nodule in April 2018 with recurrence in June 2019.  PRIOR THERAPY: Status post right upper lobectomy with lymph node dissection under the care of Dr. [name] on 09/02/2016.  CURRENT THERAPY: Observation.  INTERVAL HISTORY: James [name] 67 y.o. male returns to the clinic today for follow-up visit.  The patient is feeling a little bit better with improvement of his shortness of breath after the ultrasound-guided right thoracentesis.  He was also treated with a course of doxycycline for questionable inflammatory process in the lung which is actually more consistent now with recurrent adenocarcinoma based on the cytology of the pleural fluid.  The patient denied having any chest pain, shortness breath, cough or hemoptysis.  He denied having any recent weight loss or night sweats.  He has no nausea, vomiting, diarrhea or constipation.  He denied having any headache or visual changes.  He is very anxious and stressed about his recent diagnosis of the recurrent lung cancer.  MEDICAL HISTORY: Past Medical History:  Diagnosis Date  . Adenocarcinoma of right lung, stage 1 ([facility]) 09/06/2016  . Anxiety   . Arthritis    "knees, hips, back" (10/19/2012)  . Chronic diastolic congestive heart failure ([place])   . Chronic lower back pain   . Colon polyps    10/27/2002, repeat letter 09/17/2007  . Coronary artery disease   . Coronary artery disease involving native coronary artery of native heart without angina pectoris   . Depressive disorder, not elsewhere classified    no meds  . Diabetes mellitus without complication ([place])    diet controlled- no  med  (while in hosp 4/18 -elevated cbg  . Dyspnea   . Fasting hyperglycemia   . GERD (gastroesophageal reflux disease)   . Heart murmur   . Hemoptysis    abnormal CT Chest 01/29/10 - ? new GG changes RUL > not viz on plain cxr 02/26/2010  . Hypertension   . Mitral regurgitation    severe MR 08/2016  . MPN (myeloproliferative neoplasm) ([place])    1st detected 06/04/1998  . Obesity   . OSA on CPAP    last sleep study 10 years ago  . Other and unspecified hyperlipidemia   . Peripheral vascular disease ([place]) 08/2016   after lung surgey small clots in lungs,after hip dvt-5/16  . Pneumonia    4/18  . Positive PPD 1965   "non reactive in 2012" (10/19/2012)  . Routine general medical examination at a health care facility   . S/P CABG x 1 03/24/2017   [name] to LAD  . S/P mitral valve repair 03/24/2017   Complex valvuloplasty including artificial Gore-tex neochord placement x6 and 28 mm Sorin Annuloflex posterior annuloplasty band  . Special screening for malignant neoplasm of prostate   . Spinal stenosis, unspecified region other than cervical   . Wrist pain, left     ALLERGIES:  is allergic to symbicort [budesonide-formoterol fumarate] and amoxicillin.  MEDICATIONS:  Current Outpatient Medications  Medication Sig Dispense Refill  . acetaminophen (TYLENOL) 325 MG tablet Take 650 mg by mouth every 6 (six) hours as needed for moderate pain or headache.    Marland Kitchen aspirin EC 81 MG  tablet Take 1 tablet (81 mg total) by mouth daily. 90 tablet 3  . atorvastatin (LIPITOR) 20 MG tablet TAKE 1 TABLET (20 MG TOTAL) BY MOUTH DAILY. 90 tablet 0  . doxycycline (VIBRA-TABS) 100 MG tablet Take 1 tablet (100 mg total) by mouth 2 (two) times daily. 28 tablet 0  . furosemide (LASIX) 20 MG tablet Take 1 tablet (20 mg total) by mouth daily. 90 tablet 3  . metoprolol tartrate (LOPRESSOR) 25 MG tablet TAKE 1/2 TABLET BY MOUTH 2 TIMES DAILY 30 tablet 11  . Omega-3 Fatty Acids (FISH OIL) 500 MG CAPS Take 500 mg by  mouth daily.     . potassium chloride SA (K-DUR,KLOR-CON) 20 MEQ tablet Take 1 tablet (20 mEq total) by mouth daily as needed. With lasix 90 tablet 0  . traMADol (ULTRAM) 50 MG tablet Take 1 tablet (50 mg total) by mouth every 6 (six) hours as needed (may take one or two tablets every six hrs prn). (Patient not taking: Reported on 10/07/2017) 30 tablet 0   No current facility-administered medications for this visit.     SURGICAL HISTORY:  Past Surgical History:  Procedure Laterality Date  . ANTERIOR CRUCIATE LIGAMENT REPAIR Left 1967  . CARDIAC CATHETERIZATION  2000  . CHEST TUBE INSERTION Right 10/19/2012   post bronch  . COLONOSCOPY W/ POLYPECTOMY    . CORONARY ARTERY BYPASS GRAFT N/A 03/24/2017   Procedure: CORONARY ARTERY BYPASS GRAFTING (CABG)x1 using left internal mammary artery, [name]-LAD;  Surgeon: Rexene Alberts, MD;  Location: [facility];  Service: Open Heart Surgery;  Laterality: N/A;  . FLEXIBLE BRONCHOSCOPY  10/19/2012   Flexible video fiberoptic bronchoscopy with electromagnetic navigation and biopsies.  . INTRAVASCULAR PRESSURE WIRE/FFR STUDY N/A 08/11/2016   Procedure: Intravascular Pressure Wire/FFR Study;  Surgeon: [name], MD;  Location: [place] [facility];  Service: Cardiovascular;  Laterality: N/A;  . LOBECTOMY Right 09/02/2016   Procedure: RIGHT UPPER LOBECTOMY;  Surgeon: Melrose Nakayama, MD;  Location: [facility];  Service: Thoracic;  Laterality: Right;  . LYMPH NODE DISSECTION Right 09/02/2016   Procedure: LYMPH NODE DISSECTION, RIGHT LUNG;  Surgeon: Melrose Nakayama, MD;  Location: [facility];  Service: Thoracic;  Laterality: Right;  . MITRAL VALVE REPAIR N/A 03/24/2017   Procedure: MITRAL VALVE REPAIR (MVR) with Sorin Carbomedics Annuloflex size 28;  Surgeon: Rexene Alberts, MD;  Location: [facility];  Service: Open Heart Surgery;  Laterality: N/A;  . RIGHT/LEFT HEART CATH AND CORONARY ANGIOGRAPHY N/A 08/11/2016   Procedure: Right/Left Heart Cath and Coronary Angiography;   Surgeon: [name], MD;  Location: [facility];  Service: Cardiovascular;  Laterality: N/A;  . TEE WITHOUT CARDIOVERSION N/A 07/17/2016   Procedure: TRANSESOPHAGEAL ECHOCARDIOGRAM (TEE);  Surgeon: [name], MD;  Location: [facility] ENDOSCOPY;  Service: Cardiovascular;  Laterality: N/A;  . TEE WITHOUT CARDIOVERSION N/A 03/24/2017   Procedure: TRANSESOPHAGEAL ECHOCARDIOGRAM (TEE);  Surgeon: Rexene Alberts, MD;  Location: [facility];  Service: Open Heart Surgery;  Laterality: N/A;  . TONSILLECTOMY  1950's  . TOTAL HIP ARTHROPLASTY Left 10/05/2014   dr [name]  . TOTAL HIP ARTHROPLASTY Left 10/05/2014   Procedure: LEFT TOTAL HIP ARTHROPLASTY ANTERIOR APPROACH;  Surgeon: [name], MD;  Location: [facility];  Service: Orthopedics;  Laterality: Left;  Marland Kitchen VIDEO ASSISTED THORACOSCOPY (VATS)/WEDGE RESECTION Right 09/02/2016   Procedure: VIDEO ASSISTED THORACOSCOPY (VATS)/RIGHT UPPER LOBE WEDGE RESECTION;  Surgeon: Melrose Nakayama, MD;  Location: [facility];  Service: Thoracic;  Laterality: Right;  .  VIDEO BRONCHOSCOPY WITH ENDOBRONCHIAL NAVIGATION N/A 10/19/2012   Procedure: VIDEO BRONCHOSCOPY WITH ENDOBRONCHIAL NAVIGATION;  Surgeon: [name], MD;  Location: [facility];  Service: Thoracic;  Laterality: N/A;  . WRIST RECONSTRUCTION Left 12/2009   'proximal row carpectomy" [name]    REVIEW OF SYSTEMS:  Constitutional: negative Eyes: negative Ears, nose, mouth, throat, and face: negative Respiratory: negative Cardiovascular: negative Gastrointestinal: negative Genitourinary:negative Integument/breast: negative Hematologic/lymphatic: negative Musculoskeletal:negative Neurological: negative Behavioral/Psych: positive for anxiety Endocrine: negative Allergic/Immunologic: negative   PHYSICAL EXAMINATION: General appearance: alert, cooperative and no distress Head: Normocephalic, without obvious abnormality, atraumatic Neck: no adenopathy, no JVD, supple, symmetrical, trachea midline and thyroid  not enlarged, symmetric, no tenderness/mass/nodules Lymph nodes: Cervical, supraclavicular, and axillary nodes normal. Resp: clear to auscultation bilaterally Back: symmetric, no curvature. ROM normal. No CVA tenderness. Cardio: regular rate and rhythm, S1, S2 normal, no murmur, click, rub or gallop GI: soft, non-tender; bowel sounds normal; no masses,  no organomegaly Extremities: extremities normal, atraumatic, no cyanosis or edema Neurologic: Alert and oriented X 3, normal strength and tone. Normal symmetric reflexes. Normal coordination and gait  ECOG PERFORMANCE STATUS: 1 - Symptomatic but completely ambulatory  Blood pressure 122/67, pulse 68, temperature 98.5 F (36.9 C), temperature source Oral, resp. rate 18, height 5' 9"  (1.753 m), weight 222 lb 3.2 oz (100.8 kg), SpO2 98 %.  LABORATORY DATA: Lab Results  Component Value Date   WBC 6.6 10/01/2017   HGB 15.9 10/01/2017   HCT 46.8 10/01/2017   MCV 87.8 10/01/2017   PLT 201 10/01/2017      Chemistry      Component Value Date/Time   NA 139 10/01/2017 1026   NA 142 10/01/2016 1534   K 4.2 10/01/2017 1026   K 4.2 10/01/2016 1534   CL 108 10/01/2017 1026   CO2 25 10/01/2017 1026   CO2 28 10/01/2016 1534   BUN 17 10/01/2017 1026   BUN 15.9 10/01/2016 1534   CREATININE 1.01 10/01/2017 1026   CREATININE 1.10 02/08/2017 1101   CREATININE 1.5 (H) 10/01/2016 1534      Component Value Date/Time   CALCIUM 9.5 10/01/2017 1026   CALCIUM 9.6 10/01/2016 1534   ALKPHOS 70 10/01/2017 1026   ALKPHOS 65 10/01/2016 1534   AST 24 10/01/2017 1026   AST 20 10/01/2016 1534   ALT 37 10/01/2017 1026   ALT 24 10/01/2016 1534   BILITOT 0.6 10/01/2017 1026   BILITOT 0.46 10/01/2016 1534       RADIOGRAPHIC STUDIES: Dg Chest 1 View  Result Date: 10/13/2017 CLINICAL DATA:  Status post right-sided thoracentesis EXAM: CHEST  1 VIEW COMPARISON:  Chest radiograph May 17, 2017 and chest CT Oct 01, 2017 FINDINGS: No appreciable  pneumothorax. There is small residual pleural effusion on the right with right base atelectasis. There is no edema or consolidation. Heart size is upper normal with pulmonary vascularity normal. Patient is status post median sternotomy. No evident adenopathy. There are surgical clips on the right inferiorly. IMPRESSION: No evident pneumothorax. Small residual right pleural effusion with right base atelectasis. No edema or consolidation. Stable cardiac silhouette. Electronically Signed   By: Lowella Grip III M.D.   On: 10/13/2017 12:55   Ct Chest W Contrast  Result Date: 10/01/2017 CLINICAL DATA:  Non-small-cell lung cancer. Status post right upper lobectomy. EXAM: CT CHEST WITH CONTRAST TECHNIQUE: Multidetector CT imaging of the chest was performed during intravenous contrast administration. CONTRAST:  13m OMNIPAQUE IOHEXOL 300 MG/ML  SOLN COMPARISON:  03/22/2017 FINDINGS: Cardiovascular: The  heart size is normal. No pericardial effusion. Coronary artery calcification is evident. Atherosclerotic calcification is noted in the wall of the thoracic aorta. Mediastinum/Nodes: No mediastinal lymphadenopathy. No left hilar lymphadenopathy. Similar appearance of soft tissue in the right hilum, most likely treatment related. The esophagus has normal imaging features. There is no axillary lymphadenopathy. Lungs/Pleura: The central tracheobronchial airways are patent. Surgical staple line identified anterior right lung. 6 mm right lung nodule identified on the previous study is stable today (image 63/series 5). 4 mm right lung nodule seen on image 69/5 is new in the interval. 2.6 x 1.6 cm nodular opacity in the posterior right lung (image 84/series 5) has an appearance suggestive of collapse/consolidation on coronal and sagittal imaging, more so than a discrete soft tissue nodule. Moderate right pleural effusion with subpulmonic component is new in the interval. A new cluster of tree in bud nodularity with branching  opacity suggesting airway impaction is seen in the medial left upper lobe (image 41/series 5. Nodular components to this disease measure up to 7 mm. No left pleural effusion. Upper Abdomen: Hepatic cysts again noted. Central sinus cysts versus mild hydronephrosis in the left kidney is similar to prior. Musculoskeletal: Bone windows reveal no worrisome lytic or sclerotic osseous lesions. IMPRESSION: 1. 2.6 x 1.6 cm nodular density in the posterior right lower lobe may be infectious/inflammatory based on coronal and sagittal imaging. However metastatic disease cannot be excluded. Small to moderate unilateral right pleural effusion also raises concern. 2. New 4 mm right lung nodule. 3. Cluster of tree in bud nodularity and airway impaction in the medial left upper lobe. Imaging features most suggestive of atypical infection. 4.  Aortic Atherosclerois (ICD10-170.0) Electronically Signed   By: Misty Stanley M.D.   On: 10/01/2017 15:33   US Thoracentesis Asp Pleural Space W/img Guide  Result Date: 10/13/2017 INDICATION: Patient with history of right lung adenocarcinoma and prior right upper lobectomy. Now with right pleural effusion. Request made for diagnostic and therapeutic right thoracentesis. EXAM: ULTRASOUND GUIDED DIAGNOSTIC AND THERAPEUTIC RIGHT THORACENTESIS MEDICATIONS: None COMPLICATIONS: None immediate. PROCEDURE: An ultrasound guided thoracentesis was thoroughly discussed with the patient and questions answered. The benefits, risks, alternatives and complications were also discussed. The patient understands and wishes to proceed with the procedure. Written consent was obtained. Ultrasound was performed to localize and mark an adequate pocket of fluid in the right chest. The area was then prepped and draped in the normal sterile fashion. 1% Lidocaine was used for local anesthesia. Under ultrasound guidance a 6 Fr Safe-T-Centesis catheter was introduced. Thoracentesis was performed. The catheter was removed  and a dressing applied. FINDINGS: A total of approximately 1.2 liters of hazy, amber fluid was removed. Samples were sent to the laboratory as requested by the clinical team. IMPRESSION: Successful ultrasound guided diagnostic and therapeutic right thoracentesis yielding 1.2 liters of pleural fluid. Read by: Rowe Robert, PA-C Electronically Signed   By: Jerilynn Mages.  Shick M.D.   On: 10/13/2017 12:51    ASSESSMENT AND PLAN: This is a very pleasant 67 years old white male with a stage Ia non-small cell lung cancer status post right upper lobectomy with lymph node dissection on September 02, 2016. The patient has been in observation since that time. Unfortunately there are some concerning findings with nodular density in the posterior right lower lobe as well as increase and right pleural effusion suspicious for disease recurrence. He recently underwent ultrasound-guided right thoracentesis and the cytology of the pleural fluid was consistent with recurrent adenocarcinoma. I  had a lengthy discussion with the patient and his wife about his current condition and treatment options. I recommended for the patient to have repeat PET scan as well as MRI of the brain for restaging of his disease. I will ask the pathology department to send a tissue block either from the recurrent pleural cytology or the initial resection specimen to [facility] for molecular studies and PDL 1 expression. I will also have a blood test for EGFR mutation analysis performed today. I will refer the patient to Dr. [name] for consideration of Pleurx catheter placement for the recurrent right pleural effusion. I will see the patient back for follow-up visit in 2-3 weeks for reevaluation and more detailed discussion of his treatment options based on the final staging work-up and molecular studies. The patient was advised to call immediately if he has any concerning symptoms in the interval. The patient voices understanding of current disease  status and treatment options and is in agreement with the current care plan.  All questions were answered. The patient knows to call the clinic with any problems, questions or concerns. We can certainly see the patient much sooner if necessary.  Disclaimer: This note was dictated with voice recognition software. Similar sounding words can inadvertently be transcribed and may not be corrected upon review.

## 2017-10-16 LAB — BODY FLUID CULTURE
Culture: NO GROWTH
Special Requests: NORMAL

## 2017-10-18 ENCOUNTER — Telehealth: Payer: Self-pay | Admitting: *Deleted

## 2017-10-18 ENCOUNTER — Other Ambulatory Visit: Payer: Self-pay | Admitting: Medical Oncology

## 2017-10-18 DIAGNOSIS — C3490 Malignant neoplasm of unspecified part of unspecified bronchus or lung: Secondary | ICD-10-CM

## 2017-10-18 DIAGNOSIS — F40240 Claustrophobia: Secondary | ICD-10-CM

## 2017-10-18 MED ORDER — LORAZEPAM 1 MG PO TABS: 1.0000 mg | ORAL_TABLET | Freq: Three times a day (TID) | ORAL | 0 refills | Status: DC

## 2017-10-18 MED ORDER — LORAZEPAM 1 MG PO TABS: 1.0000 mg | ORAL_TABLET | Freq: Once | ORAL | 0 refills | Status: AC

## 2017-10-18 NOTE — Telephone Encounter (Signed)
"  I'd like an ICD-10 code for this patient."  Provided C34.90, C34.91 for Adenocarcinoma Rt. Lung, stage IV.  Denies further needs or questions at this time.

## 2017-10-18 NOTE — Progress Notes (Signed)
Wife notified.that rx called in.

## 2017-10-20 ENCOUNTER — Encounter (HOSPITAL_COMMUNITY): Payer: Self-pay | Admitting: Radiology

## 2017-10-20 ENCOUNTER — Other Ambulatory Visit: Payer: Self-pay | Admitting: Medical Oncology

## 2017-10-20 ENCOUNTER — Telehealth: Payer: Self-pay | Admitting: Internal Medicine

## 2017-10-20 ENCOUNTER — Encounter (HOSPITAL_COMMUNITY): Payer: Self-pay

## 2017-10-20 ENCOUNTER — Encounter: Payer: Medicare Other | Admitting: Thoracic Surgery (Cardiothoracic Vascular Surgery)

## 2017-10-20 ENCOUNTER — Other Ambulatory Visit: Payer: Self-pay | Admitting: Internal Medicine

## 2017-10-20 ENCOUNTER — Ambulatory Visit (HOSPITAL_COMMUNITY)
Admission: RE | Admit: 2017-10-20 | Discharge: 2017-10-20 | Disposition: A | Discharge status: Home / Self Care | Payer: Medicare Other | Source: Ambulatory Visit | Attending: Internal Medicine | Admitting: Internal Medicine

## 2017-10-20 DIAGNOSIS — C3490 Malignant neoplasm of unspecified part of unspecified bronchus or lung: Secondary | ICD-10-CM

## 2017-10-20 DIAGNOSIS — C3491 Malignant neoplasm of unspecified part of right bronchus or lung: Secondary | ICD-10-CM

## 2017-10-20 MED ORDER — LORAZEPAM 1 MG PO TABS: ORAL_TABLET | ORAL | 0 refills | Status: DC

## 2017-10-20 NOTE — Telephone Encounter (Signed)
Per MM no Nutrition consult needed.

## 2017-10-22 LAB — EPIDERMAL GROWTH FACTOR RECEPTOR (EGFR) MUTATION ANALYSIS

## 2017-10-25 ENCOUNTER — Ambulatory Visit (HOSPITAL_COMMUNITY)
Admission: RE | Admit: 2017-10-25 | Discharge: 2017-10-25 | Disposition: A | Discharge status: Home / Self Care | Payer: Medicare Other | Source: Ambulatory Visit | Attending: Internal Medicine | Admitting: Internal Medicine

## 2017-10-25 ENCOUNTER — Telehealth: Payer: Self-pay | Admitting: *Deleted

## 2017-10-25 ENCOUNTER — Ambulatory Visit: Payer: Medicare Other | Admitting: Thoracic Surgery (Cardiothoracic Vascular Surgery)

## 2017-10-25 DIAGNOSIS — J90 Pleural effusion, not elsewhere classified: Secondary | ICD-10-CM | POA: Insufficient documentation

## 2017-10-25 DIAGNOSIS — Z902 Acquired absence of lung [part of]: Secondary | ICD-10-CM | POA: Insufficient documentation

## 2017-10-25 DIAGNOSIS — C3490 Malignant neoplasm of unspecified part of unspecified bronchus or lung: Secondary | ICD-10-CM | POA: Diagnosis present

## 2017-10-25 DIAGNOSIS — I700 Atherosclerosis of aorta: Secondary | ICD-10-CM | POA: Insufficient documentation

## 2017-10-25 DIAGNOSIS — R590 Localized enlarged lymph nodes: Secondary | ICD-10-CM | POA: Insufficient documentation

## 2017-10-25 DIAGNOSIS — R918 Other nonspecific abnormal finding of lung field: Secondary | ICD-10-CM | POA: Insufficient documentation

## 2017-10-25 LAB — GLUCOSE, CAPILLARY: Glucose-Capillary: 133 mg/dL — ABNORMAL HIGH (ref 65–99)

## 2017-10-25 MED ORDER — FLUDEOXYGLUCOSE F - 18 (FDG) INJECTION: 10.8000 | Freq: Once | INTRAVENOUS | Status: DC | PRN

## 2017-10-25 NOTE — Telephone Encounter (Signed)
Oncology Nurse Navigator Documentation  Oncology Nurse Navigator Flowsheets 10/25/2017  Navigator Location [facility]  Navigator Encounter Type Telephone/I called patient to update him on appt this Thursday to see Dr. [name] at [facility].  Patient verbalized understanding of appt time and place.   Telephone Outgoing Call  Treatment Phase Pre-Tx/Tx Discussion  Barriers/Navigation Needs Education;Coordination of Care  Education Other  Interventions Coordination of Care;Education  Coordination of Care Appts  Education Method Verbal  Acuity Level 2  Acuity Level 2 Other  Time Spent with Patient 30

## 2017-10-26 ENCOUNTER — Ambulatory Visit (HOSPITAL_COMMUNITY)
Admission: RE | Admit: 2017-10-26 | Discharge: 2017-10-26 | Disposition: A | Discharge status: Home / Self Care | Payer: Medicare Other | Source: Ambulatory Visit | Attending: Internal Medicine | Admitting: Internal Medicine

## 2017-10-26 ENCOUNTER — Other Ambulatory Visit: Payer: Self-pay | Admitting: Internal Medicine

## 2017-10-26 ENCOUNTER — Encounter (HOSPITAL_COMMUNITY): Payer: Self-pay | Admitting: Internal Medicine

## 2017-10-26 DIAGNOSIS — C3490 Malignant neoplasm of unspecified part of unspecified bronchus or lung: Secondary | ICD-10-CM

## 2017-10-26 DIAGNOSIS — G319 Degenerative disease of nervous system, unspecified: Secondary | ICD-10-CM | POA: Diagnosis not present

## 2017-10-26 MED ORDER — GADOBENATE DIMEGLUMINE 529 MG/ML IV SOLN
20.0000 mL | Freq: Once | INTRAVENOUS | Status: AC | PRN
  Administered 2017-10-26: 20 mL via INTRAVENOUS

## 2017-10-28 ENCOUNTER — Other Ambulatory Visit: Payer: Self-pay

## 2017-10-28 ENCOUNTER — Other Ambulatory Visit: Payer: Self-pay | Admitting: *Deleted

## 2017-10-28 ENCOUNTER — Telehealth: Payer: Self-pay | Admitting: *Deleted

## 2017-10-28 ENCOUNTER — Encounter: Payer: Self-pay | Admitting: Thoracic Surgery (Cardiothoracic Vascular Surgery)

## 2017-10-28 ENCOUNTER — Institutional Professional Consult (permissible substitution): Payer: Medicare Other | Admitting: Thoracic Surgery (Cardiothoracic Vascular Surgery)

## 2017-10-28 VITALS — BP 143/87 | HR 94 | Resp 18 | Ht 69.0 in | Wt 217.0 lb

## 2017-10-28 DIAGNOSIS — J910 Malignant pleural effusion: Secondary | ICD-10-CM

## 2017-10-28 NOTE — Progress Notes (Signed)
[address]       [place] [address]             [phone]     HPI: [name] returns today for evaluation of a malignant right pleural effusion.  James [name] is a 67 year old man who had a right upper lobectomy in April 2018 for a stage Ia non-small cell carcinoma.  There was no visceral pleural involvement but there was involvement of the subpleural connective tissue noted on pathology.  He did not require adjuvant therapy.  He had CABG x1 with mitral valve repair in November 2018.  He has had some issues with shortness of breath exertion since his lung surgery.  It improved for a while and then got worse again after his cardiac surgery.  It then was starting to improve again until May when he started noticing a little more shortness of breath with exertion.  He could continue to walk about 3 miles a day but would notice it when he was pushing things in a wheelbarrow.  He saw Dr. Julien [name] on 10/07/2017.  A CT of the chest showed a nodular density in the right lung base as well as a pleural effusion.  He underwent thoracentesis on 10/13/2017.  1.2 L of fluid was drained.  Cytology was positive for adenocarcinoma.  He has had a PET and MRI of the brain, but has not yet discussed those results with Dr. Earlie [name].  Past Medical History:  Diagnosis Date  . Adenocarcinoma of right lung, stage 1 ([place]) 09/06/2016  . Anxiety   . Arthritis    "knees, hips, back" (10/19/2012)  . Chronic diastolic congestive heart failure ([place])   . Chronic lower back pain   . Colon polyps    10/27/2002, repeat letter 09/17/2007  . Coronary artery disease   . Coronary artery disease involving native coronary artery of native heart without angina pectoris   . Depressive disorder, not elsewhere classified    no meds  . Diabetes mellitus without complication ([place])    diet controlled- no med  (while in hosp 4/18 -elevated cbg  . Dyspnea   . Fasting hyperglycemia   . GERD (gastroesophageal reflux  disease)   . Heart murmur   . Hemoptysis    abnormal CT Chest 01/29/10 - ? new GG changes RUL > not viz on plain cxr 02/26/2010  . Hypertension   . Mitral regurgitation    severe MR 08/2016  . MPN (myeloproliferative neoplasm) ([place])    1st detected 06/04/1998  . Obesity   . OSA on CPAP    last sleep study 10 years ago  . Other and unspecified hyperlipidemia   . Peripheral vascular disease ([place]) 08/2016   after lung surgey small clots in lungs,after hip dvt-5/16  . Pneumonia    4/18  . Positive PPD 1965   "non reactive in 2012" (10/19/2012)  . Routine general medical examination at a health care facility   . S/P CABG x 1 03/24/2017   LIMA to LAD  . S/P mitral valve repair 03/24/2017   Complex valvuloplasty including artificial Gore-tex neochord placement x6 and 28 mm Sorin Annuloflex posterior annuloplasty band  . Special screening for malignant neoplasm of prostate   . Spinal stenosis, unspecified region other than cervical   . Wrist pain, left      Current Outpatient Medications  Medication Sig Dispense Refill  . acetaminophen (TYLENOL) 325 MG tablet Take 650 mg by mouth every 6 (six) hours  as needed for moderate pain or headache.    Marland Kitchen aspirin EC 81 MG tablet Take 1 tablet (81 mg total) by mouth daily. 90 tablet 3  . atorvastatin (LIPITOR) 20 MG tablet TAKE 1 TABLET (20 MG TOTAL) BY MOUTH DAILY. 90 tablet 0  . furosemide (LASIX) 20 MG tablet Take 1 tablet (20 mg total) by mouth daily. 90 tablet 3  . LORazepam (ATIVAN) 1 MG tablet One tab 30 min before scan 2 tablet 0  . metoprolol tartrate (LOPRESSOR) 25 MG tablet TAKE 1/2 TABLET BY MOUTH 2 TIMES DAILY 30 tablet 11  . Omega-3 Fatty Acids (FISH OIL) 500 MG CAPS Take 500 mg by mouth daily.     . potassium chloride SA (K-DUR,KLOR-CON) 20 MEQ tablet Take 1 tablet (20 mEq total) by mouth daily as needed. With lasix 90 tablet 0  . traMADol (ULTRAM) 50 MG tablet Take 1 tablet (50 mg total) by mouth every 6 (six) hours as needed (may  take one or two tablets every six hrs prn). 30 tablet 0   No current facility-administered medications for this visit.    Facility-Administered Medications Ordered in Other Visits  Medication Dose Route Frequency Provider Last Rate Last Dose  . fludeoxyglucose F - 18 (FDG) injection 69.6 millicurie  78.9 millicurie Intravenous Once PRN James Picket, MD        Physical Exam BP (!) 143/87 (BP Location: Right Arm, Patient Position: Sitting, Cuff Size: Normal)   Pulse 94   Resp 18   Ht 5\' 9"  (1.753 m)   Wt 217 lb (98.4 kg)   SpO2 95% Comment: RA  BMI 32.27 kg/m  67 year old man in no acute distress Alert and oriented x3 with no focal deficits Very emotional No cervical or subclavicular adenopathy Diminished breath sounds right base  Diagnostic Tests: NUCLEAR MEDICINE PET SKULL BASE TO THIGH  TECHNIQUE: 10.8 mCi F-18 FDG was injected intravenously. Full-ring PET imaging was performed from the skull base to thigh after the radiotracer. CT data was obtained and used for attenuation correction and anatomic localization.  Fasting blood glucose: 133 mg/dl  COMPARISON:  Chest CT of 10/01/2017.  Most recent PET 08/17/2016.  FINDINGS: Mediastinal blood pool activity: SUV max 2.4  NECK: No areas of abnormal hypermetabolism.  No cervical adenopathy.  Incidental CT findings: No cervical adenopathy.  CHEST: Status post right upper lobectomy. The area of subpleural right lower lobe nodularity described on the prior diagnostic CT is again identified. This measures 2.8 x 1.7 cm and a S.U.V. max of 2.2 on image 42/8. Compare 2.6 x 1.6 cm on the prior diagnostic CT.  Medial left upper lobe/apical clustered nodules correspond to hypermetabolism at PET. The largest measures 8 mm and a S.U.V. max of 3.7 on image 20/8, similar in size to the prior diagnostic CT.  A node positioned within the subcarinal station with extension into the azygoesophageal recess measures 10 mm and a  S.U.V. max of 3.8 on image 73/4. This is similar in size to on the prior diagnostic CT versus 7 mm on 08/17/2016.  Incidental CT findings: More lateral right lower lobe 6 mm nodule is unchanged on image 33/8. Small right pleural effusion is unchanged. Lad coronary artery atherosclerosis. Aortic valve calcification.  ABDOMEN/PELVIS: A focus of central right hepatic lobe hypermetabolism is without CT correlate. This measures a S.U.V. max of 5.3, including on approximately image 102/4. No other parenchymal and no abdominopelvic nodal hypermetabolism.  Incidental CT findings: Hepatic cysts. Left renal sinus cysts. Moderate prostatomegaly.  Fat containing left inguinal hernia.  SKELETON: No abnormal marrow activity.  Incidental CT findings: Left hip arthroplasty. Degenerative partial fusion of the bilateral sacroiliac joints.  IMPRESSION: 1. Status post right upper lobectomy. The area of subpleural right lower lobe soft tissue density is similar to minimally increased, with low-level hypermetabolism. Low-level activity, morphology and position immediately adjacent to a right pleural effusion favor rounded atelectasis. Consider CT follow-up at 3 months. 2. Enlargement of a right-sided mediastinal node which demonstrates mild hypermetabolism. This is indeterminate. Nodal metastasis cannot be excluded. This could also be re-evaluated at follow-up. 3. Focus of hypermetabolism in the central right liver lobe, without correlate on today's CT or dedicated images from 10/06/2012. Potential clinical strategies include attention on follow-up versus more complete characterization with dedicated pre and post contrast abdominal MRI. 4. Medial left upper lobe/apical hypermetabolic clustered nodularity, likely indicative of infectious bronchiolitis with mucoid impaction. 5. Small right pleural effusion. 6. Aortic Atherosclerosis (ICD10-I70.0). Aortic valvular calcifications. Consider  echocardiography to evaluate for valvular dysfunction.   Electronically Signed   By: James [name] M.D.   On: 10/27/2017 13:21 I personally reviewed the PET/CT images and concur with the findings noted above.  Impression: James [name] is a 67 year old man who had a right upper lobectomy for stage Ia adenocarcinoma in April 2018.  He now presents back with a malignant pleural effusion at 1 year.  He had the effusion drained about 2 weeks ago.  1.2 L of fluid was removed.  His PET/CT which was done about a week later showed a moderate right pleural effusion.  PDL 1 testing was negative.  Foundation one is still pending.  He has an appointment with Dr. Julien [name] next week to discuss results of his molecular testing and plan treatment.  He was sent for consideration for pleural catheter placement for his malignant effusion.   He tolerated the thoracentesis without difficulty.  He did have some symptomatic improvement for a few days afterwards.  Overall his exercise tolerance is still very good.  I discussed the general nature of the procedure with him.  He understands this will be done in the operating room under local with IV sedation.  He would not need general anesthesia.  We would do it on an outpatient basis.  There would be ongoing catheter care at home.  I discussed the indications, risks, benefits, and alternatives.  He understands the risks include, but are not limited to bleeding, infection, catheter occlusion, reaction to medication, as well as other unforeseeable complications.  He is reluctant to have a catheter placed.  He is not sure he needs it.  He prefers to just have a thoracentesis done if he becomes more symptomatic.  That certainly is an option and if he later decides he wants to have the catheter he can just call the office and we will schedule it.  I reviewed the MR brain results with him which showed no evidence of intracranial metastases.  I also reviewed the PET/CT images  and results which showed a probable subcarinal node as well as a indeterminate liver lesion.  He will discuss that further with Dr. Julien [name] next week.  Plan:  Follow-up with Dr. Julien [name] next week  Patient will call if he decides he would like to have a pleural catheter placed  Melrose Nakayama, MD [facility] [phone]

## 2017-10-28 NOTE — Telephone Encounter (Signed)
Oncology Nurse Navigator Documentation  Oncology Nurse Navigator Flowsheets 10/28/2017  Navigator Location [facility]  Navigator Encounter Type Telephone/TCTS office requested patient come to their office instead of clinic today.  I called Mr. Aloia and he was already aware but was confused. I clarified.   Telephone Outgoing Call  Treatment Phase Pre-Tx/Tx Discussion  Barriers/Navigation Needs Coordination of Care  Interventions Coordination of Care  Coordination of Care Appts  Acuity Level 2  Time Spent with Patient 30

## 2017-11-03 ENCOUNTER — Telehealth: Payer: Self-pay | Admitting: Pharmacist

## 2017-11-03 ENCOUNTER — Other Ambulatory Visit: Payer: Self-pay | Admitting: Internal Medicine

## 2017-11-03 ENCOUNTER — Inpatient Hospital Stay: Payer: Medicare Other

## 2017-11-03 ENCOUNTER — Telehealth: Payer: Self-pay

## 2017-11-03 ENCOUNTER — Encounter: Payer: Self-pay | Admitting: Internal Medicine

## 2017-11-03 ENCOUNTER — Other Ambulatory Visit: Payer: Self-pay | Admitting: Cardiology

## 2017-11-03 ENCOUNTER — Encounter: Payer: Self-pay | Admitting: *Deleted

## 2017-11-03 ENCOUNTER — Telehealth: Payer: Self-pay | Admitting: Pharmacy Technician

## 2017-11-03 ENCOUNTER — Inpatient Hospital Stay (HOSPITAL_BASED_OUTPATIENT_CLINIC_OR_DEPARTMENT_OTHER): Payer: Medicare Other | Admitting: Internal Medicine

## 2017-11-03 VITALS — BP 134/70 | HR 70 | Temp 98.3°F | Resp 17 | Ht 69.0 in | Wt 224.7 lb

## 2017-11-03 DIAGNOSIS — C3491 Malignant neoplasm of unspecified part of right bronchus or lung: Secondary | ICD-10-CM | POA: Diagnosis not present

## 2017-11-03 DIAGNOSIS — Z7189 Other specified counseling: Secondary | ICD-10-CM

## 2017-11-03 DIAGNOSIS — C3490 Malignant neoplasm of unspecified part of unspecified bronchus or lung: Secondary | ICD-10-CM

## 2017-11-03 DIAGNOSIS — I10 Essential (primary) hypertension: Secondary | ICD-10-CM | POA: Diagnosis not present

## 2017-11-03 DIAGNOSIS — J910 Malignant pleural effusion: Secondary | ICD-10-CM | POA: Diagnosis not present

## 2017-11-03 DIAGNOSIS — K769 Liver disease, unspecified: Secondary | ICD-10-CM | POA: Diagnosis not present

## 2017-11-03 DIAGNOSIS — Z5111 Encounter for antineoplastic chemotherapy: Secondary | ICD-10-CM | POA: Insufficient documentation

## 2017-11-03 DIAGNOSIS — E119 Type 2 diabetes mellitus without complications: Secondary | ICD-10-CM

## 2017-11-03 DIAGNOSIS — Z85118 Personal history of other malignant neoplasm of bronchus and lung: Secondary | ICD-10-CM

## 2017-11-03 DIAGNOSIS — Z0001 Encounter for general adult medical examination with abnormal findings: Secondary | ICD-10-CM | POA: Insufficient documentation

## 2017-11-03 LAB — CMP (CANCER CENTER ONLY)
ALT: 32 U/L (ref 0–44)
AST: 26 U/L (ref 15–41)
Albumin: 3.9 g/dL (ref 3.5–5.0)
Alkaline Phosphatase: 70 U/L (ref 38–126)
Anion gap: 7 (ref 5–15)
BUN: 16 mg/dL (ref 8–23)
CO2: 25 mmol/L (ref 22–32)
Calcium: 9.5 mg/dL (ref 8.9–10.3)
Chloride: 108 mmol/L (ref 98–111)
Creatinine: 1.11 mg/dL (ref 0.61–1.24)
GFR, Est AFR Am: >60 mL/min (ref >60)
GFR, Estimated: >60 mL/min (ref >60)
Glucose, Bld: 123 mg/dL — ABNORMAL HIGH (ref 70–99)
Potassium: 4.9 mmol/L (ref 3.5–5.1)
Sodium: 140 mmol/L (ref 135–145)
Total Bilirubin: 0.6 mg/dL (ref 0.3–1.2)
Total Protein: 6.7 g/dL (ref 6.5–8.1)

## 2017-11-03 LAB — CBC WITH DIFFERENTIAL (CANCER CENTER ONLY)
Basophils Absolute: 0 10*3/uL (ref 0.0–0.1)
Basophils Relative: 1 %
Eosinophils Absolute: 0.1 10*3/uL (ref 0.0–0.5)
Eosinophils Relative: 2 %
HCT: 46.8 % (ref 38.4–49.9)
Hemoglobin: 15.4 g/dL (ref 13.0–17.1)
Lymphocytes Relative: 16 %
Lymphs Abs: 1.1 10*3/uL (ref 0.9–3.3)
MCH: 29.5 pg (ref 27.2–33.4)
MCHC: 32.9 g/dL (ref 32.0–36.0)
MCV: 89.9 fL (ref 79.3–98.0)
Monocytes Absolute: 0.6 10*3/uL (ref 0.1–0.9)
Monocytes Relative: 9 %
Neutro Abs: 4.8 10*3/uL (ref 1.5–6.5)
Neutrophils Relative %: 72 %
Platelet Count: 196 10*3/uL (ref 140–400)
RBC: 5.2 MIL/uL (ref 4.20–5.82)
RDW: 16 % — ABNORMAL HIGH (ref 11.0–14.6)
WBC Count: 6.7 10*3/uL (ref 4.0–10.3)

## 2017-11-03 MED ORDER — OSIMERTINIB MESYLATE 80 MG PO TABS: 80.0000 mg | ORAL_TABLET | Freq: Every day | ORAL | 2 refills | Status: DC

## 2017-11-03 MED FILL — TAGRISSO 80 MG TABLET: 80 | 30 days supply | Qty: 30 | Fill #0

## 2017-11-03 NOTE — Telephone Encounter (Signed)
Printed avs and calender of upcoming appointment. Per 6/26 los

## 2017-11-03 NOTE — Telephone Encounter (Signed)
Oral Oncology Patient Advocate Encounter  Received notification from [employer] Medicare Part D that prior authorization for Tagrisso is required.  PA submitted on CoverMyMeds Key [id number] Status is pending  Oral Oncology Clinic will continue to follow.  [place] Patient [place] Phone [phone] Fax [phone] 11/03/2017 2:53 PM

## 2017-11-03 NOTE — Telephone Encounter (Signed)
Oral Oncology Patient Advocate Encounter  Prior Authorization for [place] Nip has been approved.    PA# [id number]  Effective dates: 11/03/2017 through 05/10/2018.  Oral Oncology Clinic will continue to follow.   [place] Patient [place] Phone [phone] Fax [phone] 11/03/2017 2:59 PM

## 2017-11-03 NOTE — Progress Notes (Signed)
Oncology Nurse Navigator Documentation  Oncology Nurse Navigator Flowsheets 11/03/2017  Navigator Location [facility]  Navigator Encounter Type Clinic/MDC/spoke with patient and family today clinic. He will be starting on new oral oncolytic. Oral in written information given.  Patient completed oral consent form.    Patient Visit Type MedOnc  Treatment Phase Pre-Tx/Tx Discussion  Barriers/Navigation Needs Education;Coordination of Care  Education Other  Interventions Coordination of Care;Education  Coordination of Care Other  Education Method Verbal;Written  Acuity Level 2  Time Spent with Patient 60

## 2017-11-03 NOTE — Telephone Encounter (Signed)
Oral Oncology Pharmacist Encounter  Received new prescription for Tagrisso (osimertinib) for the treatment of advanced non small cell lung cancer, EGFR mutation positive (exon 19 deletion), planned duration until disease progression or unacceptable toxicity.  Labs from 11/03/17 assessed, OK for treatment. 11/03/17 EKG shows QTc 422 msec, safe for Tagrisso initiation. Electrolytes will be closely monitored.  Current medication list in Epic reviewed, no DDIs with Tagrisso identified.  Prescription has been e-scribed to the [facility] for benefits analysis and approval.  Oral Oncology Clinic will continue to follow for insurance authorization, copayment issues, initial counseling and start date.  Johny Drilling, PharmD, BCPS, BCOP  11/03/2017 11:47 AM Oral Oncology Clinic [phone]

## 2017-11-03 NOTE — Telephone Encounter (Signed)
Oral Oncology Pharmacist Encounter  Received notification of insurance authorization of Tagrisso. Test claim at the pharmacy revealed copayment of $100 per 30-day supply. Tagrisso will be made ready for pickup from the [facility] tomorrow morning, 11/04/2017. I left voicemail for patient on his cell phone number [phone]) with above information. Also left contact information for oral oncology clinic if patient has any additional questions.  Johny Drilling, PharmD, BCPS, BCOP  11/03/2017 3:22 PM Oral Oncology Clinic [phone]

## 2017-11-03 NOTE — Telephone Encounter (Signed)
Oral Chemotherapy Pharmacist Encounter   I spoke with patient and family members in exam room room for overview of: Tagrisso.   Counseled patient on administration, dosing, side effects, monitoring, drug-food interactions, safe handling, storage, and disposal.  Patient will take Tagrisso 80 tablets, 1 tablet by mouth once daily, without regard to food.  Tagrisso start date: TBD, pending insurance authorization  Adverse effects include but are not limited to: diarrhea, mouth sores, decreased appetitie, fatigue, dry skin, rash, nail changes, altered cardiac conduction, and decreased blood counts or electrolytes.  Patient will obtain anti diarrheal and alert the office of 4 or more loose stools above baseline.   Reviewed with patient importance of keeping a medication schedule and plan for any missed doses.  Mr. Paye voiced understanding and appreciation.   All questions answered. Medication reconciliation performed and medication/allergy list updated.  We extensively discussed medicare copayment and possibility for high copayment. We discussed options for copayment assistance including copayment grant foundations and manufacturer compassionate use program. Patient instructed that if they are enrolled into manufacturer compassionate use program that medication will be shipped directly to their home from dispensing pharmacy of program's choosing.  I will follow-up with patient after insurance authorization is obtained and copayment is known. Patient signed manufacturer assistance application at office visit. This will completed and documented in a separate encounter if manufacturer assistance is needed.  Patient knows to call the office with questions or concerns. Oral Oncology Clinic will continue to follow.  Thank you,  Johny Drilling, PharmD, BCPS, BCOP  11/03/2017   11:48 AM Oral Oncology Clinic [phone]

## 2017-11-03 NOTE — Progress Notes (Signed)
[facility] [phone]   [phone]  OFFICE PROGRESS NOTE  [name], MD [address] [place] [address]  DIAGNOSIS: Recurrent non-small cell lung cancer, adenocarcinoma initially diagnosed as stage IA (T1c, N0, M0) non-small cell lung cancer, adenocarcinoma presented with right upper lobe lung nodule in April 2018 with recurrence in June 2019.  Biomarker Findings Microsatellite status - MS-Stable Tumor Mutational Burden - TMB-Low (3 Muts/Mb) Genomic Findings For a complete list of the genes assayed, please refer to the Appendix. EGFR exon 19 deletion ([id number]>B) CDKN2A/B loss NKX2-1 amplification 7 Disease relevant genes with no reportable alterations: KRAS, ALK, BRAF, MET, RET, ERBB2, ROS1   PRIOR THERAPY: Status post right upper lobectomy with lymph node dissection under the care of Dr. [name] on 09/02/2016.  CURRENT THERAPY: Tagrisso 80 mg p.o. daily.  Expected to start in the next few days.  INTERVAL HISTORY: James [name] 67 y.o. male returns to the clinic today for follow-up visit accompanied by his wife, daughter and son-in-law.  The patient is feeling fine today with no specific complaints.  He denied having any current chest pain, shortness of breath, cough or hemoptysis.  He denied having any fever or chills.  He has no nausea, vomiting, diarrhea or constipation.  He denied having any weight loss or night sweats.  He had several studies performed recently including a PET scan as well as MRI of the brain in addition to molecular studies by foundation 1 and PDL 1 expression.  He is here today for evaluation and discussion of his treatment options based on the recent staging work-up and molecular studies.  MEDICAL HISTORY: Past Medical History:  Diagnosis Date  . Adenocarcinoma of right lung, stage 1 ([place]) 09/06/2016  . Anxiety   . Arthritis    "knees, hips, back" (10/19/2012)  . Chronic diastolic  congestive heart failure ([place])   . Chronic lower back pain   . Colon polyps    10/27/2002, repeat letter 09/17/2007  . Coronary artery disease   . Coronary artery disease involving native coronary artery of native heart without angina pectoris   . Depressive disorder, not elsewhere classified    no meds  . Diabetes mellitus without complication ([place])    diet controlled- no med  (while in hosp 4/18 -elevated cbg  . Dyspnea   . Fasting hyperglycemia   . GERD (gastroesophageal reflux disease)   . Heart murmur   . Hemoptysis    abnormal CT Chest 01/29/10 - ? new GG changes RUL > not viz on plain cxr 02/26/2010  . Hypertension   . Mitral regurgitation    severe MR 08/2016  . MPN (myeloproliferative neoplasm) ([place])    1st detected 06/04/1998  . Obesity   . OSA on CPAP    last sleep study 10 years ago  . Other and unspecified hyperlipidemia   . Peripheral vascular disease ([place]) 08/2016   after lung surgey small clots in lungs,after hip dvt-5/16  . Pneumonia    4/18  . Positive PPD 1965   "non reactive in 2012" (10/19/2012)  . Routine general medical examination at a health care facility   . S/P CABG x 1 03/24/2017   LIMA to LAD  . S/P mitral valve repair 03/24/2017   Complex valvuloplasty including artificial Gore-tex neochord placement x6 and 28 mm Sorin Annuloflex posterior annuloplasty band  . Special screening for malignant neoplasm of prostate   . Spinal stenosis, unspecified region  other than cervical   . Wrist pain, left     ALLERGIES:  is allergic to symbicort [budesonide-formoterol fumarate] and amoxicillin.  MEDICATIONS:  Current Outpatient Medications  Medication Sig Dispense Refill  . acetaminophen (TYLENOL) 325 MG tablet Take 650 mg by mouth every 6 (six) hours as needed for moderate pain or headache.    Marland Kitchen aspirin EC 81 MG tablet Take 1 tablet (81 mg total) by mouth daily. 90 tablet 3  . atorvastatin (LIPITOR) 20 MG tablet TAKE 1 TABLET (20 MG TOTAL) BY MOUTH DAILY. 90  tablet 0  . furosemide (LASIX) 20 MG tablet Take 1 tablet (20 mg total) by mouth daily. 90 tablet 3  . LORazepam (ATIVAN) 1 MG tablet One tab 30 min before scan 2 tablet 0  . metoprolol tartrate (LOPRESSOR) 25 MG tablet TAKE 1/2 TABLET BY MOUTH 2 TIMES DAILY 30 tablet 11  . Omega-3 Fatty Acids (FISH OIL) 500 MG CAPS Take 500 mg by mouth daily.     . potassium chloride SA (K-DUR,KLOR-CON) 20 MEQ tablet Take 1 tablet (20 mEq total) by mouth daily as needed. With lasix 90 tablet 0  . traMADol (ULTRAM) 50 MG tablet Take 1 tablet (50 mg total) by mouth every 6 (six) hours as needed (may take one or two tablets every six hrs prn). 30 tablet 0   No current facility-administered medications for this visit.     SURGICAL HISTORY:  Past Surgical History:  Procedure Laterality Date  . ANTERIOR CRUCIATE LIGAMENT REPAIR Left 1967  . CARDIAC CATHETERIZATION  2000  . CHEST TUBE INSERTION Right 10/19/2012   post bronch  . COLONOSCOPY W/ POLYPECTOMY    . CORONARY ARTERY BYPASS GRAFT N/A 03/24/2017   Procedure: CORONARY ARTERY BYPASS GRAFTING (CABG)x1 using left internal mammary artery, LIMA-LAD;  Surgeon: Rexene Alberts, MD;  Location: [facility];  Service: Open Heart Surgery;  Laterality: N/A;  . FLEXIBLE BRONCHOSCOPY  10/19/2012   Flexible video fiberoptic bronchoscopy with electromagnetic navigation and biopsies.  . INTRAVASCULAR PRESSURE WIRE/FFR STUDY N/A 08/11/2016   Procedure: Intravascular Pressure Wire/FFR Study;  Surgeon: [name], MD;  Location: [facility];  Service: Cardiovascular;  Laterality: N/A;  . LOBECTOMY Right 09/02/2016   Procedure: RIGHT UPPER LOBECTOMY;  Surgeon: Melrose Nakayama, MD;  Location: [facility];  Service: Thoracic;  Laterality: Right;  . LYMPH NODE DISSECTION Right 09/02/2016   Procedure: LYMPH NODE DISSECTION, RIGHT LUNG;  Surgeon: Melrose Nakayama, MD;  Location: [facility];  Service: Thoracic;  Laterality: Right;  . MITRAL VALVE REPAIR N/A 03/24/2017   Procedure:  MITRAL VALVE REPAIR (MVR) with Sorin Carbomedics Annuloflex size 28;  Surgeon: Rexene Alberts, MD;  Location: [facility];  Service: Open Heart Surgery;  Laterality: N/A;  . RIGHT/LEFT HEART CATH AND CORONARY ANGIOGRAPHY N/A 08/11/2016   Procedure: Right/Left Heart Cath and Coronary Angiography;  Surgeon: [name], MD;  Location: [facility];  Service: Cardiovascular;  Laterality: N/A;  . TEE WITHOUT CARDIOVERSION N/A 07/17/2016   Procedure: TRANSESOPHAGEAL ECHOCARDIOGRAM (TEE);  Surgeon: [name], MD;  Location: [facility];  Service: Cardiovascular;  Laterality: N/A;  . TEE WITHOUT CARDIOVERSION N/A 03/24/2017   Procedure: TRANSESOPHAGEAL ECHOCARDIOGRAM (TEE);  Surgeon: Rexene Alberts, MD;  Location: [facility];  Service: Open Heart Surgery;  Laterality: N/A;  . TONSILLECTOMY  1950's  . TOTAL HIP ARTHROPLASTY Left 10/05/2014   dr [name]  . TOTAL HIP ARTHROPLASTY Left 10/05/2014   Procedure: LEFT TOTAL HIP ARTHROPLASTY ANTERIOR  APPROACH;  Surgeon: [name], MD;  Location: [facility];  Service: Orthopedics;  Laterality: Left;  Marland Kitchen VIDEO ASSISTED THORACOSCOPY (VATS)/WEDGE RESECTION Right 09/02/2016   Procedure: VIDEO ASSISTED THORACOSCOPY (VATS)/RIGHT UPPER LOBE WEDGE RESECTION;  Surgeon: Melrose Nakayama, MD;  Location: [facility];  Service: Thoracic;  Laterality: Right;  Marland Kitchen VIDEO BRONCHOSCOPY WITH ENDOBRONCHIAL NAVIGATION N/A 10/19/2012   Procedure: VIDEO BRONCHOSCOPY WITH ENDOBRONCHIAL NAVIGATION;  Surgeon: [name], MD;  Location: [facility];  Service: Thoracic;  Laterality: N/A;  . WRIST RECONSTRUCTION Left 12/2009   'proximal row carpectomy" [name]    REVIEW OF SYSTEMS:  Constitutional: negative Eyes: negative Ears, nose, mouth, throat, and face: negative Respiratory: negative Cardiovascular: negative Gastrointestinal: negative Genitourinary:negative Integument/breast: negative Hematologic/lymphatic: negative Musculoskeletal:negative Neurological: negative Behavioral/Psych:  positive for anxiety Endocrine: negative Allergic/Immunologic: negative   PHYSICAL EXAMINATION: General appearance: alert, cooperative and no distress Head: Normocephalic, without obvious abnormality, atraumatic Neck: no adenopathy, no JVD, supple, symmetrical, trachea midline and thyroid not enlarged, symmetric, no tenderness/mass/nodules Lymph nodes: Cervical, supraclavicular, and axillary nodes normal. Resp: clear to auscultation bilaterally Back: symmetric, no curvature. ROM normal. No CVA tenderness. Cardio: regular rate and rhythm, S1, S2 normal, no murmur, click, rub or gallop GI: soft, non-tender; bowel sounds normal; no masses,  no organomegaly Extremities: extremities normal, atraumatic, no cyanosis or edema Neurologic: Alert and oriented X 3, normal strength and tone. Normal symmetric reflexes. Normal coordination and gait  ECOG PERFORMANCE STATUS: 1 - Symptomatic but completely ambulatory  Blood pressure 134/70, pulse 70, temperature 98.3 F (36.8 C), temperature source Oral, resp. rate 17, height 5' 9"  (1.753 m), weight 224 lb 11.2 oz (101.9 kg), SpO2 97 %.  LABORATORY DATA: Lab Results  Component Value Date   WBC 6.7 11/03/2017   HGB 15.4 11/03/2017   HCT 46.8 11/03/2017   MCV 89.9 11/03/2017   PLT 196 11/03/2017      Chemistry      Component Value Date/Time   NA 139 10/01/2017 1026   NA 142 10/01/2016 1534   K 4.2 10/01/2017 1026   K 4.2 10/01/2016 1534   CL 108 10/01/2017 1026   CO2 25 10/01/2017 1026   CO2 28 10/01/2016 1534   BUN 17 10/01/2017 1026   BUN 15.9 10/01/2016 1534   CREATININE 1.01 10/01/2017 1026   CREATININE 1.10 02/08/2017 1101   CREATININE 1.5 (H) 10/01/2016 1534      Component Value Date/Time   CALCIUM 9.5 10/01/2017 1026   CALCIUM 9.6 10/01/2016 1534   ALKPHOS 70 10/01/2017 1026   ALKPHOS 65 10/01/2016 1534   AST 24 10/01/2017 1026   AST 20 10/01/2016 1534   ALT 37 10/01/2017 1026   ALT 24 10/01/2016 1534   BILITOT 0.6  10/01/2017 1026   BILITOT 0.46 10/01/2016 1534       RADIOGRAPHIC STUDIES: Dg Chest 1 View  Result Date: 10/13/2017 CLINICAL DATA:  Status post right-sided thoracentesis EXAM: CHEST  1 VIEW COMPARISON:  Chest radiograph May 17, 2017 and chest CT Oct 01, 2017 FINDINGS: No appreciable pneumothorax. There is small residual pleural effusion on the right with right base atelectasis. There is no edema or consolidation. Heart size is upper normal with pulmonary vascularity normal. Patient is status post median sternotomy. No evident adenopathy. There are surgical clips on the right inferiorly. IMPRESSION: No evident pneumothorax. Small residual right pleural effusion with right base atelectasis. No edema or consolidation. Stable cardiac silhouette. Electronically Signed   By: Lowella Grip III M.D.   On: 10/13/2017 12:55  Mr Jeri Cos Wo Contrast  Result Date: 10/26/2017 CLINICAL DATA:  Lung cancer staging. EXAM: MRI HEAD WITHOUT AND WITH CONTRAST TECHNIQUE: Multiplanar, multiecho pulse sequences of the brain and surrounding structures were obtained without and with intravenous contrast. CONTRAST:  75m MULTIHANCE GADOBENATE DIMEGLUMINE 529 MG/ML IV SOLN COMPARISON:  09/01/2016 FINDINGS: Some sequences are mildly motion degraded despite repeated imaging attempts. Brain: There is no evidence of acute infarct, intracranial hemorrhage, mass, midline shift, or extra-axial fluid collection. Mild cerebral atrophy is stable to slightly increased. No significant white matter disease is seen for age. No abnormal brain parenchymal or meningeal enhancement is identified. Vascular: Diminutive distal right vertebral artery, unchanged. Other major intracranial vascular flow voids are preserved. Skull and upper cervical spine: No suspicious marrow lesion. Unchanged pannus posterior to the dens without mass effect on the spinal cord. Sinuses/Orbits: Unremarkable orbits. Paranasal sinuses and mastoid air cells are clear.  Other: None. IMPRESSION: 1. No evidence of intracranial metastases or acute abnormality. 2. Mild cerebral atrophy. Electronically Signed   By: ALogan BoresM.D.   On: 10/26/2017 16:04   Nm Pet Image Restag (ps) Skull Base To Thigh  Result Date: 10/27/2017 CLINICAL DATA:  Subsequent treatment strategy for restaging of lung cancer. EXAM: NUCLEAR MEDICINE PET SKULL BASE TO THIGH TECHNIQUE: 10.8 mCi F-18 FDG was injected intravenously. Full-ring PET imaging was performed from the skull base to thigh after the radiotracer. CT data was obtained and used for attenuation correction and anatomic localization. Fasting blood glucose: 133 mg/dl COMPARISON:  Chest CT of 10/01/2017.  Most recent PET 08/17/2016. FINDINGS: Mediastinal blood pool activity: SUV max 2.4 NECK: No areas of abnormal hypermetabolism.  No cervical adenopathy. Incidental CT findings: No cervical adenopathy. CHEST: Status post right upper lobectomy. The area of subpleural right lower lobe nodularity described on the prior diagnostic CT is again identified. This measures 2.8 x 1.7 cm and a S.U.V. max of 2.2 on image 42/8. Compare 2.6 x 1.6 cm on the prior diagnostic CT. Medial left upper lobe/apical clustered nodules correspond to hypermetabolism at PET. The largest measures 8 mm and a S.U.V. max of 3.7 on image 20/8, similar in size to the prior diagnostic CT. A node positioned within the subcarinal station with extension into the azygoesophageal recess measures 10 mm and a S.U.V. max of 3.8 on image 73/4. This is similar in size to on the prior diagnostic CT versus 7 mm on 08/17/2016. Incidental CT findings: More lateral right lower lobe 6 mm nodule is unchanged on image 33/8. Small right pleural effusion is unchanged. Lad coronary artery atherosclerosis. Aortic valve calcification. ABDOMEN/PELVIS: A focus of central right hepatic lobe hypermetabolism is without CT correlate. This measures a S.U.V. max of 5.3, including on approximately image 102/4. No  other parenchymal and no abdominopelvic nodal hypermetabolism. Incidental CT findings: Hepatic cysts. Left renal sinus cysts. Moderate prostatomegaly. Fat containing left inguinal hernia. SKELETON: No abnormal marrow activity. Incidental CT findings: Left hip arthroplasty. Degenerative partial fusion of the bilateral sacroiliac joints. IMPRESSION: 1. Status post right upper lobectomy. The area of subpleural right lower lobe soft tissue density is similar to minimally increased, with low-level hypermetabolism. Low-level activity, morphology and position immediately adjacent to a right pleural effusion favor rounded atelectasis. Consider CT follow-up at 3 months. 2. Enlargement of a right-sided mediastinal node which demonstrates mild hypermetabolism. This is indeterminate. Nodal metastasis cannot be excluded. This could also be re-evaluated at follow-up. 3. Focus of hypermetabolism in the central right liver lobe, without correlate on today's  CT or dedicated images from 10/06/2012. Potential clinical strategies include attention on follow-up versus more complete characterization with dedicated pre and post contrast abdominal MRI. 4. Medial left upper lobe/apical hypermetabolic clustered nodularity, likely indicative of infectious bronchiolitis with mucoid impaction. 5. Small right pleural effusion. 6. Aortic Atherosclerosis (ICD10-I70.0). Aortic valvular calcifications. Consider echocardiography to evaluate for valvular dysfunction. Electronically Signed   By: Abigail Miyamoto M.D.   On: 10/27/2017 13:21   US Thoracentesis Asp Pleural Space W/img Guide  Result Date: 10/13/2017 INDICATION: Patient with history of right lung adenocarcinoma and prior right upper lobectomy. Now with right pleural effusion. Request made for diagnostic and therapeutic right thoracentesis. EXAM: ULTRASOUND GUIDED DIAGNOSTIC AND THERAPEUTIC RIGHT THORACENTESIS MEDICATIONS: None COMPLICATIONS: None immediate. PROCEDURE: An ultrasound guided  thoracentesis was thoroughly discussed with the patient and questions answered. The benefits, risks, alternatives and complications were also discussed. The patient understands and wishes to proceed with the procedure. Written consent was obtained. Ultrasound was performed to localize and mark an adequate pocket of fluid in the right chest. The area was then prepped and draped in the normal sterile fashion. 1% Lidocaine was used for local anesthesia. Under ultrasound guidance a 6 Fr Safe-T-Centesis catheter was introduced. Thoracentesis was performed. The catheter was removed and a dressing applied. FINDINGS: A total of approximately 1.2 liters of hazy, amber fluid was removed. Samples were sent to the laboratory as requested by the clinical team. IMPRESSION: Successful ultrasound guided diagnostic and therapeutic right thoracentesis yielding 1.2 liters of pleural fluid. Read by: Rowe Robert, PA-C Electronically Signed   By: Jerilynn Mages.  Shick M.D.   On: 10/13/2017 12:51    ASSESSMENT AND PLAN: This is a very pleasant 67 years old white male with a stage Ia non-small cell lung cancer status post right upper lobectomy with lymph node dissection on September 02, 2016. The patient has been in observation since that time. Unfortunately there are some concerning findings with nodular density in the posterior right lower lobe as well as increase and right pleural effusion suspicious for disease recurrence. He recently underwent ultrasound-guided right thoracentesis and the cytology of the pleural fluid was consistent with recurrent adenocarcinoma. He had molecular studies performed by foundation 1 that showed positive EGFR mutation with deletion 79. His PET scan showed evidence for disease recurrence in the right pleural effusion with suspicious mild hypermetabolic activity in mediastinal lymph nodes and liver lesion.  His brain MRI was negative for malignancy. I had a lengthy discussion with the patient and his family about  his current condition and treatment options.  In the presence of a positive EGFR mutation, I recommended for the patient treatment with targeted therapy with Tagrisso 80 mg p.o. Daily. I discussed with the patient the adverse effect of this treatment including but not limited to skin rash, diarrhea, interstitial lung disease, cardiac, liver, or renal dysfunction. He will see the pharmacist for oral medication today for discussion of Tagrisso and adverse effect as well as help obtaining the drug for him. I will see him back for follow-up visit in 2-3 weeks for reevaluation with repeat CBC, and complaints metabolic panel. For the right pleural effusion, we will consider The patient for Pleurx catheter placement if she has reaccumulation of the fluid with shortness breath. He was advised to call immediately if he has any concerning symptoms in the interval. The patient voices understanding of current disease status and treatment options and is in agreement with the current care plan. I spent 15 minutes counseling the patient  face to face. The total time spent in the appointment was 25 minutes.  All questions were answered. The patient knows to call the clinic with any problems, questions or concerns. We can certainly see the patient much sooner if necessary.  Disclaimer: This note was dictated with voice recognition software. Similar sounding words can inadvertently be transcribed and may not be corrected upon review.

## 2017-11-05 ENCOUNTER — Telehealth: Payer: Self-pay | Admitting: *Deleted

## 2017-11-05 ENCOUNTER — Encounter (HOSPITAL_COMMUNITY): Payer: Self-pay | Admitting: Internal Medicine

## 2017-11-05 ENCOUNTER — Encounter: Payer: Self-pay | Admitting: *Deleted

## 2017-11-05 DIAGNOSIS — C3491 Malignant neoplasm of unspecified part of right bronchus or lung: Secondary | ICD-10-CM

## 2017-11-05 NOTE — Progress Notes (Signed)
Oncology Nurse Navigator Documentation  Oncology Nurse Navigator Flowsheets 11/05/2017  Navigator Location [facility]  Navigator Encounter Type Telephone/Patient called and c/o of increasing SOB when walking up stairs.  Dr. Julien Nordmann updated and orders received.  I called [facility] per Dr. Julien Nordmann to ask them to schedule patient for a follow up.  I then called patient to update. He states he does not want pleur x yet just thoracentesis.  I called [facility] back and left them a message not to schedule him that patient will get thoracentesis.  VO for US thoracentesis completed.   Telephone Outgoing Call  Treatment Phase Pre-Tx/Tx Discussion  Barriers/Navigation Needs Education;Coordination of Care  Education Other  Interventions Coordination of Care;Education  Coordination of Care Other  Education Method Verbal  Support Groups/Services -  Acuity Level 2  Acuity Level 2 -  Time Spent with Patient 30

## 2017-11-05 NOTE — Telephone Encounter (Signed)
Pt called with request for thoracentesis next week. Order entered.

## 2017-11-10 ENCOUNTER — Ambulatory Visit: Payer: Medicare Other | Admitting: Internal Medicine

## 2017-11-10 NOTE — Telephone Encounter (Signed)
Oral Oncology Pharmacist Encounter  Received confirmation from the [facility] that patient picked up their first fill of Tagrisso 80mg  tablets on 11/04/17.  Johny Drilling, PharmD, BCPS, BCOP  11/10/2017 1:34 PM Oral Oncology Clinic [phone]

## 2017-11-12 ENCOUNTER — Ambulatory Visit (HOSPITAL_COMMUNITY)
Admission: RE | Admit: 2017-11-12 | Discharge: 2017-11-12 | Disposition: A | Discharge status: Home / Self Care | Payer: Medicare Other | Source: Ambulatory Visit | Attending: Internal Medicine | Admitting: Internal Medicine

## 2017-11-12 ENCOUNTER — Ambulatory Visit (HOSPITAL_COMMUNITY)
Admission: RE | Admit: 2017-11-12 | Discharge: 2017-11-12 | Disposition: A | Discharge status: Home / Self Care | Payer: Medicare Other | Source: Ambulatory Visit | Attending: Radiology | Admitting: Radiology

## 2017-11-12 ENCOUNTER — Ambulatory Visit: Payer: Medicare Other | Admitting: Family

## 2017-11-12 ENCOUNTER — Encounter: Payer: Self-pay | Admitting: Family

## 2017-11-12 VITALS — BP 114/72 | HR 60 | Temp 97.9°F | Ht 69.0 in | Wt 218.1 lb

## 2017-11-12 DIAGNOSIS — J90 Pleural effusion, not elsewhere classified: Secondary | ICD-10-CM | POA: Diagnosis present

## 2017-11-12 DIAGNOSIS — H00015 Hordeolum externum left lower eyelid: Secondary | ICD-10-CM

## 2017-11-12 DIAGNOSIS — Z9889 Other specified postprocedural states: Secondary | ICD-10-CM

## 2017-11-12 DIAGNOSIS — C3491 Malignant neoplasm of unspecified part of right bronchus or lung: Secondary | ICD-10-CM | POA: Insufficient documentation

## 2017-11-12 MED ORDER — LIDOCAINE HCL 1 % IJ SOLN
INTRAMUSCULAR | Status: AC
  Filled 2017-11-12: qty 20

## 2017-11-12 MED ORDER — TOBRAMYCIN 0.3 % OP SOLN: 1.0000 [drp] | Freq: Four times a day (QID) | OPHTHALMIC | 0 refills | Status: DC

## 2017-11-12 NOTE — Progress Notes (Signed)
James [name] is a 67 y.o. male with the following history as recorded in EpicCare:  Patient Active Problem List   Diagnosis Date Noted  . Encounter for antineoplastic chemotherapy 11/03/2017  . Adenocarcinoma of right lung, stage 4 ([place]) 10/15/2017  . Goals of care, counseling/discussion 10/15/2017  . Deficiency anemia 07/28/2017  . Need for shingles vaccine 07/28/2017  . S/P mitral valve repair  03/24/2017  . S/P CABG x 1 03/24/2017  . Coronary artery disease involving native coronary artery of native heart without angina pectoris   . Chronic diastolic congestive heart failure ([place])   . Primary insomnia 10/27/2016  . Pulmonary embolus ([place]) 10/22/2016  . Adenocarcinoma of right lung, stage 1 ([place]) 09/06/2016  . OA (osteoarthritis) of hip 10/05/2014  . OSA on CPAP 07/02/2014  . Chronic venous insufficiency 07/02/2014  . BPH with obstruction/lower urinary tract symptoms 07/12/2013  . Allergic rhinitis 07/12/2013  . Routine health maintenance 07/31/2011  . Hypogonadism male 07/29/2011  . Obesity 06/08/2007  . Type II diabetes mellitus with manifestations ([place]) 06/08/2007  . Hyperlipidemia with target LDL less than 130 01/18/2007  . GERD 01/18/2007  . Spinal stenosis of lumbar region 01/18/2007    Current Outpatient Medications  Medication Sig Dispense Refill  . acetaminophen (TYLENOL) 325 MG tablet Take 650 mg by mouth every 6 (six) hours as needed for moderate pain or headache.    Marland Kitchen aspirin EC 81 MG tablet Take 1 tablet (81 mg total) by mouth daily. 90 tablet 3  . atorvastatin (LIPITOR) 20 MG tablet TAKE 1 TABLET (20 MG TOTAL) BY MOUTH DAILY. 90 tablet 0  . furosemide (LASIX) 40 MG tablet TAKE 1 TABLET BY MOUTH EVERY DAY 90 tablet 3  . LORazepam (ATIVAN) 1 MG tablet One tab 30 min before scan 2 tablet 0  . metoprolol tartrate (LOPRESSOR) 25 MG tablet TAKE 1/2 TABLET BY MOUTH 2 TIMES DAILY 30 tablet 11  . Omega-3 Fatty Acids (FISH OIL) 500 MG CAPS Take 500 mg by mouth daily.      Marland Kitchen osimertinib mesylate (TAGRISSO) 80 MG tablet Take 1 tablet (80 mg total) by mouth daily. 30 tablet 2  . potassium chloride SA (K-DUR,KLOR-CON) 20 MEQ tablet Take 1 tablet (20 mEq total) by mouth daily as needed. With lasix 90 tablet 0  . traMADol (ULTRAM) 50 MG tablet Take 1 tablet (50 mg total) by mouth every 6 (six) hours as needed (may take one or two tablets every six hrs prn). 30 tablet 0  . tobramycin (TOBREX) 0.3 % ophthalmic solution Place 1 drop into the left eye every 6 (six) hours. 5 mL 0   No current facility-administered medications for this visit.    Facility-Administered Medications Ordered in Other Visits  Medication Dose Route Frequency Provider Last Rate Last Dose  . lidocaine (XYLOCAINE) 1 % (with pres) injection             Allergies: Symbicort [budesonide-formoterol fumarate] and Amoxicillin  Past Medical History:  Diagnosis Date  . Adenocarcinoma of right lung, stage 1 ([place]) 09/06/2016  . Anxiety   . Arthritis    "knees, hips, back" (10/19/2012)  . Chronic diastolic congestive heart failure ([place])   . Chronic lower back pain   . Colon polyps    10/27/2002, repeat letter 09/17/2007  . Coronary artery disease   . Coronary artery disease involving native coronary artery of native heart without angina pectoris   . Depressive disorder, not elsewhere classified    no meds  . Diabetes  mellitus without complication ([place])    diet controlled- no med  (while in hosp 4/18 -elevated cbg  . Dyspnea   . Fasting hyperglycemia   . GERD (gastroesophageal reflux disease)   . Heart murmur   . Hemoptysis    abnormal CT Chest 01/29/10 - ? new GG changes RUL > not viz on plain cxr 02/26/2010  . Hypertension   . Mitral regurgitation    severe MR 08/2016  . MPN (myeloproliferative neoplasm) ([place])    1st detected 06/04/1998  . Obesity   . OSA on CPAP    last sleep study 10 years ago  . Other and unspecified hyperlipidemia   . Peripheral vascular disease ([place]) 08/2016   after lung  surgey small clots in lungs,after hip dvt-5/16  . Pneumonia    4/18  . Positive PPD 1965   "non reactive in 2012" (10/19/2012)  . Routine general medical examination at a health care facility   . S/P CABG x 1 03/24/2017   LIMA to LAD  . S/P mitral valve repair 03/24/2017   Complex valvuloplasty including artificial Gore-tex neochord placement x6 and 28 mm Sorin Annuloflex posterior annuloplasty band  . Special screening for malignant neoplasm of prostate   . Spinal stenosis, unspecified region other than cervical   . Wrist pain, left     Past Surgical History:  Procedure Laterality Date  . ANTERIOR CRUCIATE LIGAMENT REPAIR Left 1967  . CARDIAC CATHETERIZATION  2000  . CHEST TUBE INSERTION Right 10/19/2012   post bronch  . COLONOSCOPY W/ POLYPECTOMY    . CORONARY ARTERY BYPASS GRAFT N/A 03/24/2017   Procedure: CORONARY ARTERY BYPASS GRAFTING (CABG)x1 using left internal mammary artery, LIMA-LAD;  Surgeon: Rexene Alberts, MD;  Location: [facility];  Service: Open Heart Surgery;  Laterality: N/A;  . FLEXIBLE BRONCHOSCOPY  10/19/2012   Flexible video fiberoptic bronchoscopy with electromagnetic navigation and biopsies.  . INTRAVASCULAR PRESSURE WIRE/FFR STUDY N/A 08/11/2016   Procedure: Intravascular Pressure Wire/FFR Study;  Surgeon: [name], MD;  Location:  [facility];  Service: Cardiovascular;  Laterality: N/A;  . LOBECTOMY Right 09/02/2016   Procedure: RIGHT UPPER LOBECTOMY;  Surgeon: Melrose Nakayama, MD;  Location: [facility];  Service: Thoracic;  Laterality: Right;  . LYMPH NODE DISSECTION Right 09/02/2016   Procedure: LYMPH NODE DISSECTION, RIGHT LUNG;  Surgeon: Melrose Nakayama, MD;  Location: [facility];  Service: Thoracic;  Laterality: Right;  . MITRAL VALVE REPAIR N/A 03/24/2017   Procedure: MITRAL VALVE REPAIR (MVR) with Sorin Carbomedics Annuloflex size 28;  Surgeon: Rexene Alberts, MD;  Location: [facility];  Service: Open Heart Surgery;  Laterality: N/A;  . RIGHT/LEFT  HEART CATH AND CORONARY ANGIOGRAPHY N/A 08/11/2016   Procedure: Right/Left Heart Cath and Coronary Angiography;  Surgeon: [name], MD;  Location: [facility];  Service: Cardiovascular;  Laterality: N/A;  . TEE WITHOUT CARDIOVERSION N/A 07/17/2016   Procedure: TRANSESOPHAGEAL ECHOCARDIOGRAM (TEE);  Surgeon: [name], MD;  Location: [facility];  Service: Cardiovascular;  Laterality: N/A;  . TEE WITHOUT CARDIOVERSION N/A 03/24/2017   Procedure: TRANSESOPHAGEAL ECHOCARDIOGRAM (TEE);  Surgeon: Rexene Alberts, MD;  Location: [facility];  Service: Open Heart Surgery;  Laterality: N/A;  . TONSILLECTOMY  1950's  . TOTAL HIP ARTHROPLASTY Left 10/05/2014   dr [name]  . TOTAL HIP ARTHROPLASTY Left 10/05/2014   Procedure: LEFT TOTAL HIP ARTHROPLASTY ANTERIOR APPROACH;  Surgeon: [name], MD;  Location: [facility];  Service: Orthopedics;  Laterality: Left;  .  VIDEO ASSISTED THORACOSCOPY (VATS)/WEDGE RESECTION Right 09/02/2016   Procedure: VIDEO ASSISTED THORACOSCOPY (VATS)/RIGHT UPPER LOBE WEDGE RESECTION;  Surgeon: Melrose Nakayama, MD;  Location: [facility];  Service: Thoracic;  Laterality: Right;  Marland Kitchen VIDEO BRONCHOSCOPY WITH ENDOBRONCHIAL NAVIGATION N/A 10/19/2012   Procedure: VIDEO BRONCHOSCOPY WITH ENDOBRONCHIAL NAVIGATION;  Surgeon: [name], MD;  Location: [facility];  Service: Thoracic;  Laterality: N/A;  . WRIST RECONSTRUCTION Left 12/2009   'proximal row carpectomy" [name]    Family History  Problem Relation Age of Onset  . Heart failure Mother   . Heart disease Father   . Heart attack Father   . Heart disease Paternal Uncle   . Prostate cancer Neg Hx   . Colon cancer Neg Hx   . Hypertension Neg Hx   . Hyperlipidemia Neg Hx   . Diabetes Neg Hx     Social History   Tobacco Use  . Smoking status: Never Smoker  . Smokeless tobacco: Never Used  Substance Use Topics  . Alcohol use: Yes    Alcohol/week: 0.6 oz    Types: 1 Cans of beer per week    Comment: none since 3 weeks     Subjective:  Stye left eye x 1 week; +itchy, matted shut in the am; no eye pain, no vision changes; no prior history of stye; no contact lenses; feels that eye is looking better in the past few days; Does feel that both of his eyes are "itching" more recently; wonders if could be related to new medication, Tagrisso, that he started last week.   Objective:  Vitals:   11/12/17 1510  BP: 114/72  Pulse: 60  Temp: 97.9 F (36.6 C)  TempSrc: Oral  SpO2: 96%  Weight: 218 lb 1.9 oz (98.9 kg)  Height: 5\' 9"  (1.753 m)    General: Well developed, well nourished, in no acute distress  Skin : Warm and dry.  Head: Normocephalic and atraumatic  Eyes: Sclera and conjunctiva clear; pupils round and reactive to light; extraocular movements intact; stye noted in lower left lid  Lungs: Respirations unlabored; Neurologic: Alert and oriented; speech intact; face symmetrical; moves all extremities well; CNII-XII intact without focal deficit   Assessment:  1. Hordeolum externum of left lower eyelid     Plan:  Rx for Tobramycin Opht Soluntion use q 6 hours as directed; apply warm compresses up to 4x per day; follow-up worse, no better. Ask his oncologist about eye itching possibly associated with Tagrisso;    No follow-ups on file.  No orders of the defined types were placed in this encounter.   Requested Prescriptions   Signed Prescriptions Disp Refills  . tobramycin (TOBREX) 0.3 % ophthalmic solution 5 mL 0    Sig: Place 1 drop into the left eye every 6 (six) hours.

## 2017-11-12 NOTE — Patient Instructions (Signed)

## 2017-11-12 NOTE — Procedures (Signed)
Ultrasound-guided diagnostic and therapeutic right thoracentesis performed yielding 950 cc of yellow fluid. No immediate complications. Follow-up chest x-ray pending. Due to pt chest discomfort only the above amount of fluid was aspirated today.

## 2017-11-22 ENCOUNTER — Ambulatory Visit (HOSPITAL_COMMUNITY)
Admission: RE | Admit: 2017-11-22 | Discharge: 2017-11-22 | Disposition: A | Discharge status: Home / Self Care | Payer: Medicare Other | Source: Ambulatory Visit | Attending: Internal Medicine | Admitting: Internal Medicine

## 2017-11-22 ENCOUNTER — Other Ambulatory Visit (HOSPITAL_COMMUNITY): Payer: Medicare Other

## 2017-11-22 ENCOUNTER — Other Ambulatory Visit: Payer: Medicare Other

## 2017-11-22 ENCOUNTER — Encounter (HOSPITAL_COMMUNITY): Payer: Self-pay

## 2017-11-22 DIAGNOSIS — R918 Other nonspecific abnormal finding of lung field: Secondary | ICD-10-CM | POA: Insufficient documentation

## 2017-11-22 DIAGNOSIS — C3490 Malignant neoplasm of unspecified part of unspecified bronchus or lung: Secondary | ICD-10-CM | POA: Diagnosis present

## 2017-11-22 DIAGNOSIS — J90 Pleural effusion, not elsewhere classified: Secondary | ICD-10-CM | POA: Insufficient documentation

## 2017-11-22 DIAGNOSIS — I700 Atherosclerosis of aorta: Secondary | ICD-10-CM | POA: Insufficient documentation

## 2017-11-22 MED ORDER — IOPAMIDOL (ISOVUE-300) INJECTION 61%
INTRAVENOUS | Status: AC
  Filled 2017-11-22: qty 100

## 2017-11-22 MED ORDER — IOPAMIDOL (ISOVUE-300) INJECTION 61%
100.0000 mL | Freq: Once | INTRAVENOUS | Status: AC | PRN
  Administered 2017-11-22: 75 mL via INTRAVENOUS

## 2017-11-24 ENCOUNTER — Inpatient Hospital Stay: Payer: Medicare Other | Attending: Internal Medicine | Admitting: Internal Medicine

## 2017-11-24 ENCOUNTER — Encounter: Payer: Self-pay | Admitting: *Deleted

## 2017-11-24 ENCOUNTER — Telehealth: Payer: Self-pay | Admitting: Internal Medicine

## 2017-11-24 ENCOUNTER — Encounter: Payer: Self-pay | Admitting: Internal Medicine

## 2017-11-24 ENCOUNTER — Inpatient Hospital Stay: Payer: Medicare Other

## 2017-11-24 VITALS — BP 134/75 | HR 58 | Temp 98.9°F | Resp 17 | Ht 69.0 in | Wt 220.9 lb

## 2017-11-24 DIAGNOSIS — E119 Type 2 diabetes mellitus without complications: Secondary | ICD-10-CM | POA: Diagnosis not present

## 2017-11-24 DIAGNOSIS — I10 Essential (primary) hypertension: Secondary | ICD-10-CM | POA: Insufficient documentation

## 2017-11-24 DIAGNOSIS — Z79899 Other long term (current) drug therapy: Secondary | ICD-10-CM

## 2017-11-24 DIAGNOSIS — J90 Pleural effusion, not elsewhere classified: Secondary | ICD-10-CM | POA: Insufficient documentation

## 2017-11-24 DIAGNOSIS — C3491 Malignant neoplasm of unspecified part of right bronchus or lung: Secondary | ICD-10-CM

## 2017-11-24 DIAGNOSIS — Z5111 Encounter for antineoplastic chemotherapy: Secondary | ICD-10-CM

## 2017-11-24 DIAGNOSIS — C3411 Malignant neoplasm of upper lobe, right bronchus or lung: Secondary | ICD-10-CM | POA: Insufficient documentation

## 2017-11-24 LAB — CMP (CANCER CENTER ONLY)
ALT: 36 U/L (ref 0–44)
AST: 31 U/L (ref 15–41)
Albumin: 4.1 g/dL (ref 3.5–5.0)
Alkaline Phosphatase: 66 U/L (ref 38–126)
Anion gap: 6 (ref 5–15)
BUN: 21 mg/dL (ref 8–23)
CO2: 25 mmol/L (ref 22–32)
Calcium: 9.5 mg/dL (ref 8.9–10.3)
Chloride: 108 mmol/L (ref 98–111)
Creatinine: 1.27 mg/dL — ABNORMAL HIGH (ref 0.61–1.24)
GFR, Est AFR Am: >60 mL/min (ref >60)
GFR, Estimated: 56 mL/min — ABNORMAL LOW (ref >60)
Glucose, Bld: 109 mg/dL — ABNORMAL HIGH (ref 70–99)
Potassium: 4.4 mmol/L (ref 3.5–5.1)
Sodium: 139 mmol/L (ref 135–145)
Total Bilirubin: 0.6 mg/dL (ref 0.3–1.2)
Total Protein: 7.1 g/dL (ref 6.5–8.1)

## 2017-11-24 LAB — CBC WITH DIFFERENTIAL (CANCER CENTER ONLY)
Basophils Absolute: 0.1 10*3/uL (ref 0.0–0.1)
Basophils Relative: 1 %
Eosinophils Absolute: 0.2 10*3/uL (ref 0.0–0.5)
Eosinophils Relative: 3 %
HCT: 47.1 % (ref 38.4–49.9)
Hemoglobin: 15.4 g/dL (ref 13.0–17.1)
Lymphocytes Relative: 16 %
Lymphs Abs: 1.1 10*3/uL (ref 0.9–3.3)
MCH: 29.2 pg (ref 27.2–33.4)
MCHC: 32.7 g/dL (ref 32.0–36.0)
MCV: 89.4 fL (ref 79.3–98.0)
Monocytes Absolute: 0.6 10*3/uL (ref 0.1–0.9)
Monocytes Relative: 8 %
Neutro Abs: 4.9 10*3/uL (ref 1.5–6.5)
Neutrophils Relative %: 72 %
Platelet Count: 148 10*3/uL (ref 140–400)
RBC: 5.27 MIL/uL (ref 4.20–5.82)
RDW: 15.5 % — ABNORMAL HIGH (ref 11.0–14.6)
WBC Count: 6.8 10*3/uL (ref 4.0–10.3)

## 2017-11-24 LAB — MAGNESIUM: Magnesium: 2.1 mg/dL (ref 1.7–2.4)

## 2017-11-24 NOTE — Progress Notes (Signed)
[place] [phone]   [phone]  OFFICE PROGRESS NOTE  [name], MD [address] [facility] [address] [place] [address]  DIAGNOSIS: Recurrent non-small cell lung cancer, adenocarcinoma initially diagnosed as stage IA (T1c, N0, M0) non-small cell lung cancer, adenocarcinoma presented with right upper lobe lung nodule in April 2018 with recurrence in June 2019.  Biomarker Findings Microsatellite status - MS-Stable Tumor Mutational Burden - TMB-Low (3 Muts/Mb) Genomic Findings For a complete list of the genes assayed, please refer to the Appendix. EGFR exon 19 deletion ([id number]>X) CDKN2A/B loss NKX2-1 amplification 7 Disease relevant genes with no reportable alterations: KRAS, ALK, BRAF, MET, RET, ERBB2, ROS1   PRIOR THERAPY: Status post right upper lobectomy with lymph node dissection under the care of Dr. [name] on 09/02/2016.  CURRENT THERAPY: Tagrisso 80 mg p.o. daily.  First dose started November 05, 2017.  INTERVAL HISTORY: MARSHON BANGS 67 y.o. male returns to the clinic today for follow-up visit accompanied by his wife.  The patient is feeling fine today with no concerning complaints except for mild fatigue.  He denied having any chest pain, shortness breath, cough or hemoptysis.  He denied having any fever or chills.  He has no nausea, vomiting, diarrhea or constipation.  He denied having any skin rash.  The patient had ultrasound-guided right thoracentesis on 11/12/2017 with drainage of 950 mL of pleural fluid.  He is not very interested in having a Pleurx catheter placed at this point and would like to wait a little bit longer.  He also had a repeat CT scan of the chest performed on 11/22/2017 and he is here for evaluation and discussion of his discuss results.   MEDICAL HISTORY: Past Medical History:  Diagnosis Date  . Adenocarcinoma of right lung, stage 1 ([place]) 09/06/2016  . Anxiety   . Arthritis    "knees, hips, back"  (10/19/2012)  . Chronic diastolic congestive heart failure ([place])   . Chronic lower back pain   . Colon polyps    10/27/2002, repeat letter 09/17/2007  . Coronary artery disease   . Coronary artery disease involving native coronary artery of native heart without angina pectoris   . Depressive disorder, not elsewhere classified    no meds  . Diabetes mellitus without complication ([place])    diet controlled- no med  (while in hosp 4/18 -elevated cbg  . Dyspnea   . Fasting hyperglycemia   . GERD (gastroesophageal reflux disease)   . Heart murmur   . Hemoptysis    abnormal CT Chest 01/29/10 - ? new GG changes RUL > not viz on plain cxr 02/26/2010  . Hypertension   . Mitral regurgitation    severe MR 08/2016  . MPN (myeloproliferative neoplasm) ([place])    1st detected 06/04/1998  . Obesity   . OSA on CPAP    last sleep study 10 years ago  . Other and unspecified hyperlipidemia   . Peripheral vascular disease ([place]) 08/2016   after lung surgey small clots in lungs,after hip dvt-5/16  . Pneumonia    4/18  . Positive PPD 1965   "non reactive in 2012" (10/19/2012)  . Routine general medical examination at a health care facility   . S/P CABG x 1 03/24/2017   LIMA to LAD  . S/P mitral valve repair 03/24/2017   Complex valvuloplasty including artificial Gore-tex neochord placement x6 and 28 mm Sorin Annuloflex posterior annuloplasty band  . Special screening for malignant  neoplasm of prostate   . Spinal stenosis, unspecified region other than cervical   . Wrist pain, left     ALLERGIES:  is allergic to symbicort [budesonide-formoterol fumarate] and amoxicillin.  MEDICATIONS:  Current Outpatient Medications  Medication Sig Dispense Refill  . acetaminophen (TYLENOL) 325 MG tablet Take 650 mg by mouth every 6 (six) hours as needed for moderate pain or headache.    Marland Kitchen aspirin EC 81 MG tablet Take 1 tablet (81 mg total) by mouth daily. 90 tablet 3  . atorvastatin (LIPITOR) 20 MG tablet TAKE 1 TABLET  (20 MG TOTAL) BY MOUTH DAILY. 90 tablet 0  . furosemide (LASIX) 40 MG tablet TAKE 1 TABLET BY MOUTH EVERY DAY 90 tablet 3  . LORazepam (ATIVAN) 1 MG tablet One tab 30 min before scan 2 tablet 0  . metoprolol tartrate (LOPRESSOR) 25 MG tablet TAKE 1/2 TABLET BY MOUTH 2 TIMES DAILY 30 tablet 11  . Omega-3 Fatty Acids (FISH OIL) 500 MG CAPS Take 500 mg by mouth daily.     Marland Kitchen osimertinib mesylate (TAGRISSO) 80 MG tablet Take 1 tablet (80 mg total) by mouth daily. 30 tablet 2  . potassium chloride SA (K-DUR,KLOR-CON) 20 MEQ tablet Take 1 tablet (20 mEq total) by mouth daily as needed. With lasix 90 tablet 0  . tobramycin (TOBREX) 0.3 % ophthalmic solution Place 1 drop into the left eye every 6 (six) hours. 5 mL 0  . traMADol (ULTRAM) 50 MG tablet Take 1 tablet (50 mg total) by mouth every 6 (six) hours as needed (may take one or two tablets every six hrs prn). 30 tablet 0   No current facility-administered medications for this visit.     SURGICAL HISTORY:  Past Surgical History:  Procedure Laterality Date  . ANTERIOR CRUCIATE LIGAMENT REPAIR Left 1967  . CARDIAC CATHETERIZATION  2000  . CHEST TUBE INSERTION Right 10/19/2012   post bronch  . COLONOSCOPY W/ POLYPECTOMY    . CORONARY ARTERY BYPASS GRAFT N/A 03/24/2017   Procedure: CORONARY ARTERY BYPASS GRAFTING (CABG)x1 using left internal mammary artery, LIMA-LAD;  Surgeon: Rexene Alberts, MD;  Location: [facility];  Service: Open Heart Surgery;  Laterality: N/A;  . FLEXIBLE BRONCHOSCOPY  10/19/2012   Flexible video fiberoptic bronchoscopy with electromagnetic navigation and biopsies.  . INTRAVASCULAR PRESSURE WIRE/FFR STUDY N/A 08/11/2016   Procedure: Intravascular Pressure Wire/FFR Study;  Surgeon: [name], MD;  Location: [facility];  Service: Cardiovascular;  Laterality: N/A;  . LOBECTOMY Right 09/02/2016   Procedure: RIGHT UPPER LOBECTOMY;  Surgeon: Melrose Nakayama, MD;  Location: [facility];  Service: Thoracic;  Laterality: Right;    . LYMPH NODE DISSECTION Right 09/02/2016   Procedure: LYMPH NODE DISSECTION, RIGHT LUNG;  Surgeon: Melrose Nakayama, MD;  Location: [facility];  Service: Thoracic;  Laterality: Right;  . MITRAL VALVE REPAIR N/A 03/24/2017   Procedure: MITRAL VALVE REPAIR (MVR) with Sorin Carbomedics Annuloflex size 28;  Surgeon: Rexene Alberts, MD;  Location: [facility];  Service: Open Heart Surgery;  Laterality: N/A;  . RIGHT/LEFT HEART CATH AND CORONARY ANGIOGRAPHY N/A 08/11/2016   Procedure: Right/Left Heart Cath and Coronary Angiography;  Surgeon: [name], MD;  Location: [facility];  Service: Cardiovascular;  Laterality: N/A;  . TEE WITHOUT CARDIOVERSION N/A 07/17/2016   Procedure: TRANSESOPHAGEAL ECHOCARDIOGRAM (TEE);  Surgeon: [name], MD;  Location: [facility];  Service: Cardiovascular;  Laterality: N/A;  . TEE WITHOUT CARDIOVERSION N/A 03/24/2017   Procedure: TRANSESOPHAGEAL  ECHOCARDIOGRAM (TEE);  Surgeon: Rexene Alberts, MD;  Location: [facility];  Service: Open Heart Surgery;  Laterality: N/A;  . TONSILLECTOMY  1950's  . TOTAL HIP ARTHROPLASTY Left 10/05/2014   dr [name]  . TOTAL HIP ARTHROPLASTY Left 10/05/2014   Procedure: LEFT TOTAL HIP ARTHROPLASTY ANTERIOR APPROACH;  Surgeon: [name], MD;  Location: [facility];  Service: Orthopedics;  Laterality: Left;  Marland Kitchen VIDEO ASSISTED THORACOSCOPY (VATS)/WEDGE RESECTION Right 09/02/2016   Procedure: VIDEO ASSISTED THORACOSCOPY (VATS)/RIGHT UPPER LOBE WEDGE RESECTION;  Surgeon: Melrose Nakayama, MD;  Location: [facility];  Service: Thoracic;  Laterality: Right;  Marland Kitchen VIDEO BRONCHOSCOPY WITH ENDOBRONCHIAL NAVIGATION N/A 10/19/2012   Procedure: VIDEO BRONCHOSCOPY WITH ENDOBRONCHIAL NAVIGATION;  Surgeon: [name], MD;  Location: [facility];  Service: Thoracic;  Laterality: N/A;  . WRIST RECONSTRUCTION Left 12/2009   'proximal row carpectomy" [name]    REVIEW OF SYSTEMS:  A comprehensive review of systems was negative except for: Constitutional: positive  for fatigue   PHYSICAL EXAMINATION: General appearance: alert, cooperative and no distress Head: Normocephalic, without obvious abnormality, atraumatic Neck: no adenopathy, no JVD, supple, symmetrical, trachea midline and thyroid not enlarged, symmetric, no tenderness/mass/nodules Lymph nodes: Cervical, supraclavicular, and axillary nodes normal. Resp: clear to auscultation bilaterally Back: symmetric, no curvature. ROM normal. No CVA tenderness. Cardio: regular rate and rhythm, S1, S2 normal, no murmur, click, rub or gallop GI: soft, non-tender; bowel sounds normal; no masses,  no organomegaly Extremities: extremities normal, atraumatic, no cyanosis or edema  ECOG PERFORMANCE STATUS: 1 - Symptomatic but completely ambulatory  Blood pressure 134/75, pulse (!) 58, temperature 98.9 F (37.2 C), temperature source Oral, resp. rate 17, height _0  (1.753 m), weight 220 lb 14.4 oz (100.2 kg), SpO2 99 %.  LABORATORY DATA: Lab Results  Component Value Date   WBC 6.8 11/24/2017   HGB 15.4 11/24/2017   HCT 47.1 11/24/2017   MCV 89.4 11/24/2017   PLT 148 11/24/2017      Chemistry      Component Value Date/Time   NA 139 11/24/2017 1043   NA 142 10/01/2016 1534   K 4.4 11/24/2017 1043   K 4.2 10/01/2016 1534   CL 108 11/24/2017 1043   CO2 25 11/24/2017 1043   CO2 28 10/01/2016 1534   BUN 21 11/24/2017 1043   BUN 15.9 10/01/2016 1534   CREATININE 1.27 (H) 11/24/2017 1043   CREATININE 1.10 02/08/2017 1101   CREATININE 1.5 (H) 10/01/2016 1534      Component Value Date/Time   CALCIUM 9.5 11/24/2017 1043   CALCIUM 9.6 10/01/2016 1534   ALKPHOS 66 11/24/2017 1043   ALKPHOS 65 10/01/2016 1534   AST 31 11/24/2017 1043   AST 20 10/01/2016 1534   ALT 36 11/24/2017 1043   ALT 24 10/01/2016 1534   BILITOT 0.6 11/24/2017 1043   BILITOT 0.46 10/01/2016 1534       RADIOGRAPHIC STUDIES: Dg Chest 1 View  Result Date: 11/12/2017 CLINICAL DATA:  Status post right-sided thoracentesis  EXAM: CHEST  1 VIEW COMPARISON:  October 13, 2017 FINDINGS: No appreciable pneumothorax. There is a small right pleural effusion with right base atelectasis. Lungs elsewhere are clear. Heart size and pulmonary vascularity are normal. No adenopathy. Patient is status post median sternotomy. There are surgical clips in the lateral right hemithorax, stable. IMPRESSION: No pneumothorax. Small right pleural effusion with mild right base atelectasis. Lungs elsewhere clear. Stable cardiac silhouette. No evident adenopathy. Electronically Signed   By: Lowella Grip III M.D.  On: 11/12/2017 14:20   Ct Chest W Contrast  Result Date: 11/22/2017 CLINICAL DATA:  Right upper lobe adenocarcinoma post wedge resection 09/02/2016. Immunotherapy ongoing. EXAM: CT CHEST WITH CONTRAST TECHNIQUE: Multidetector CT imaging of the chest was performed during intravenous contrast administration. CONTRAST:  95m ISOVUE-300 IOPAMIDOL (ISOVUE-300) INJECTION 61% COMPARISON:  Chest CT 10/01/2017.  PET-CT 10/25/2017. FINDINGS: Cardiovascular: Atherosclerosis of aorta, great vessels and coronary arteries. The pulmonary arteries appear patent. Patient is status post mitral valve repair. The heart size is normal. There is no pericardial effusion. Mediastinum/Nodes: There are no enlarged mediastinal, hilar or axillary lymph nodes.Right infrahilar node measures 8 mm on image 81/2, compared with 10 mm on PET-CT. The thyroid gland, trachea and esophagus demonstrate no significant findings. Lungs/Pleura: Subpulmonic right pleural effusion has mildly decreased in volume and appears simple. There is no left pleural effusion or pneumothorax. Status post right upper lobectomy. The overall aeration of the right lung base has improved. The subpleural nodularity previously described posteriorly in the right lower lobe appears less discrete, most likely reflecting rounded atelectasis. The 6 mm right lower lobe nodule on image 67/7 is unchanged and consistent  with a benign finding based on stability. The clustered left upper lobe nodularity is stable, best seen on image 49/7. No new or enlarging pulmonary nodules. Upper abdomen: There are stable hepatic cysts. No new or enlarging hepatic lesions. The adrenal glands appear normal. Musculoskeletal/Chest wall: There is no chest wall mass or suspicious osseous finding. Prior median sternotomy and thoracic spondylosis noted. IMPRESSION: 1. No evidence of local recurrence or metastatic disease. 2. Interval slight decrease in right pleural effusion with improved aeration of the right lower lobe. 3. Improve subpleural nodularity in the right lower lobe, probably rounded atelectasis. Stable clustered nodularity posteriorly in the left upper lobe, likely inflammatory. No suspicious pulmonary nodules. 4.  Aortic Atherosclerosis (ICD10-I70.0). Electronically Signed   By: WRichardean SaleM.D.   On: 11/22/2017 15:39   Mr BJeri CosWVZContrast  Result Date: 10/26/2017 CLINICAL DATA:  Lung cancer staging. EXAM: MRI HEAD WITHOUT AND WITH CONTRAST TECHNIQUE: Multiplanar, multiecho pulse sequences of the brain and surrounding structures were obtained without and with intravenous contrast. CONTRAST:  258mMULTIHANCE GADOBENATE DIMEGLUMINE 529 MG/ML IV SOLN COMPARISON:  09/01/2016 FINDINGS: Some sequences are mildly motion degraded despite repeated imaging attempts. Brain: There is no evidence of acute infarct, intracranial hemorrhage, mass, midline shift, or extra-axial fluid collection. Mild cerebral atrophy is stable to slightly increased. No significant white matter disease is seen for age. No abnormal brain parenchymal or meningeal enhancement is identified. Vascular: Diminutive distal right vertebral artery, unchanged. Other major intracranial vascular flow voids are preserved. Skull and upper cervical spine: No suspicious marrow lesion. Unchanged pannus posterior to the dens without mass effect on the spinal cord. Sinuses/Orbits:  Unremarkable orbits. Paranasal sinuses and mastoid air cells are clear. Other: None. IMPRESSION: 1. No evidence of intracranial metastases or acute abnormality. 2. Mild cerebral atrophy. Electronically Signed   By: AlLogan Bores.D.   On: 10/26/2017 16:04   UsKoreahoracentesis Asp Pleural Space W/img Guide  Result Date: 11/12/2017 INDICATION: Patient with history of adenocarcinoma of right lung with recurrent malignant right pleural effusion, dyspnea. Request made for therapeutic right thoracentesis. EXAM: ULTRASOUND GUIDED THERAPEUTIC RIGHT THORACENTESIS MEDICATIONS: None COMPLICATIONS: None immediate. PROCEDURE: An ultrasound guided thoracentesis was thoroughly discussed with the patient and questions answered. The benefits, risks, alternatives and complications were also discussed. The patient understands and wishes to proceed with the procedure. Written  consent was obtained. Ultrasound was performed to localize and mark an adequate pocket of fluid in the right chest. The area was then prepped and draped in the normal sterile fashion. 1% Lidocaine was used for local anesthesia. Under ultrasound guidance a 6 Fr Safe-T-Centesis catheter was introduced. Thoracentesis was performed. The catheter was removed and a dressing applied. FINDINGS: A total of approximately 950 cc of yellow fluid was removed. IMPRESSION: Successful ultrasound guided therapeutic right thoracentesis yielding 950 cc of pleural fluid. Read by: Rowe Robert, PA-C Electronically Signed   By: Sandi Mariscal M.D.   On: 11/12/2017 14:11    ASSESSMENT AND PLAN: This is a very pleasant 67 years old white male with a stage Ia non-small cell lung cancer status post right upper lobectomy with lymph node dissection on September 02, 2016. The patient has been in observation since that time. Unfortunately there are some concerning findings with nodular density in the posterior right lower lobe as well as increase and right pleural effusion suspicious for  disease recurrence. He recently underwent ultrasound-guided right thoracentesis and the cytology of the pleural fluid was consistent with recurrent adenocarcinoma. He had molecular studies performed by [facility] 1 that showed positive EGFR mutation with deletion 27. The patient was a started on treatment with Tagrisso 80 mg p.o. daily on 11/05/2017.  He has been tolerating this treatment well with no concerning complaints. His recent CT scan of the chest which was previously scheduled from few months ago showed some evidence for improvement of his disease in less than 2 weeks of his treatment. I recommended for the patient to continue his current treatment with Tagrisso with the same dose.  He will come back for follow-up visit in 2 weeks for evaluation and repeat blood work. The patient was advised to call immediately if he has any concerning symptoms in the interval. The patient voices understanding of current disease status and treatment options and is in agreement with the current care plan. I spent 10 minutes counseling the patient face to face. The total time spent in the appointment was 15 minutes.  All questions were answered. The patient knows to call the clinic with any problems, questions or concerns. We can certainly see the patient much sooner if necessary.  Disclaimer: This note was dictated with voice recognition software. Similar sounding words can inadvertently be transcribed and may not be corrected upon review.

## 2017-11-24 NOTE — Telephone Encounter (Signed)
Scheduled appt per 7/17 los - gave patient AVS and calender per los.

## 2017-11-25 ENCOUNTER — Ambulatory Visit: Payer: Medicare Other | Admitting: Internal Medicine

## 2017-11-26 ENCOUNTER — Encounter: Payer: Self-pay | Admitting: *Deleted

## 2017-11-26 NOTE — Progress Notes (Signed)
Oncology Nurse Navigator Documentation  Oncology Nurse Navigator Flowsheets 11/26/2017  Navigator Location [facility]  Navigator Encounter Type Clinic/MDC/spoke with patient and wife on 11/24/17 at clinic. Patient is doing well and has started on oral oncolytic.  Review treatment plan.   Abnormal Finding Date 10/01/2017  Confirmed Diagnosis Date 11/12/2017  Multidisiplinary Clinic Date 10/28/2017  Treatment Initiated Date 11/05/2017  Patient Visit Type MedOnc  Treatment Phase Treatment  Barriers/Navigation Needs Education  Education Other  Interventions Education  Education Method Verbal  Acuity Level 1  Time Spent with Patient 15

## 2017-11-27 IMAGING — MR MR HEAD WO/W CM
13 series · 48 of 48 positions shown · IV contrast (multihance)
Comparison: None available for comparison at time of study
interpretation.

CLINICAL DATA: New diagnosis of lung cancer, assess for brain
metastasis. Currently asymptomatic

EXAM:
MRI HEAD WITHOUT AND WITH CONTRAST
TECHNIQUE: Multiplanar, multiecho pulse sequences of the brain and surrounding
structures were obtained without and with intravenous contrast.
CONTRAST:  20mL MULTIHANCE GADOBENATE DIMEGLUMINE 529 MG/ML IV SOLN

[Series 2: T1 · sagittal · 5.0mm · 0.45mm/px · 2 of 21 slices shown]
[im 1/21]
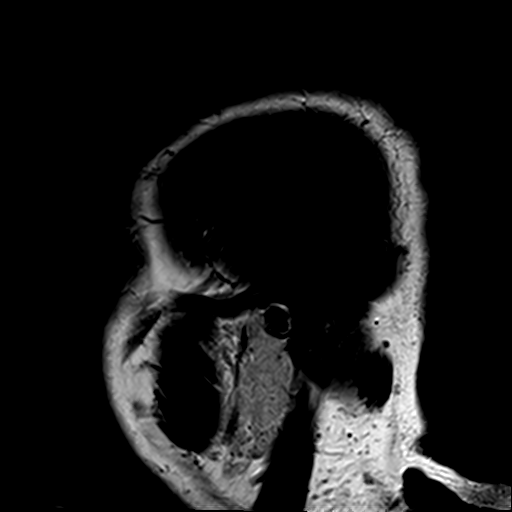
[im 21/21]
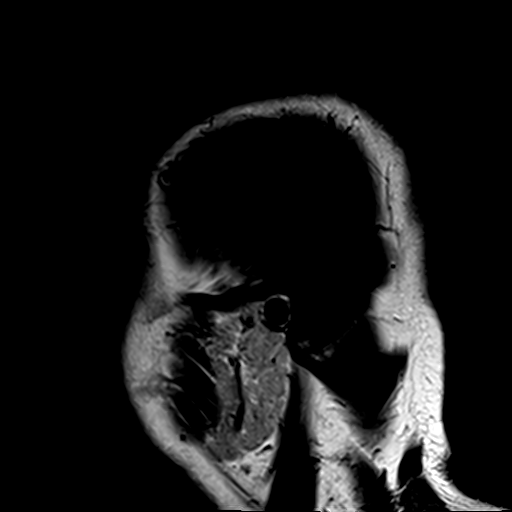

[Series 3: DWI · axial · 3.0mm · 1.80mm/px · z∈[-31,+113]mm · 7 of 98 slices shown (1 of 4)]
[im 1/98]
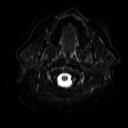
[im 17/98]
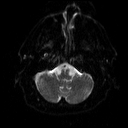
[im 33/98]
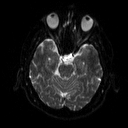
[im 49/98]
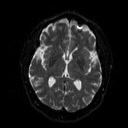
[im 65/98]
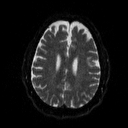
[im 81/98]
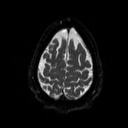
[im 98/98]
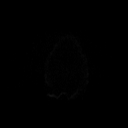

[Series 4: DWI · axial · 3.0mm · 1.80mm/px · z∈[-31,+113]mm · 3 of 48 slices shown (2 of 4)]
[im 1/48]
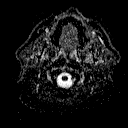
[im 24/48]
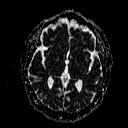
[im 48/48]
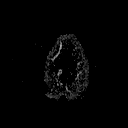

[Series 5: DWI · coronal · 5.0mm · 1.80mm/px · 4 of 64 slices shown (3 of 4)]
[im 1/64]
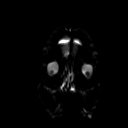
[im 22/64]
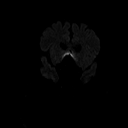
[im 43/64]
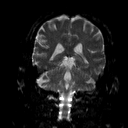
[im 64/64]
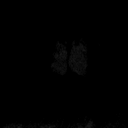

[Series 6: DWI · coronal · 5.0mm · 1.80mm/px · 2 of 32 slices shown (4 of 4)]
[im 1/32]
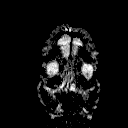
[im 32/32]
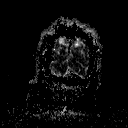

[Series 7: T2 · axial · 5.0mm · 0.51mm/px · 1 of 21 slices shown (1 of 2)]
[im 1/21]
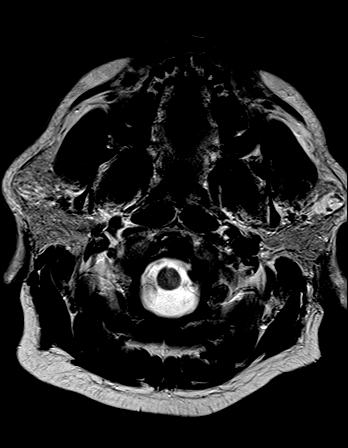

[Series 8: FLAIR · axial · 3.0mm · 0.45mm/px · z∈[-35,+103]mm · 2 of 24 slices shown]
[im 1/24]
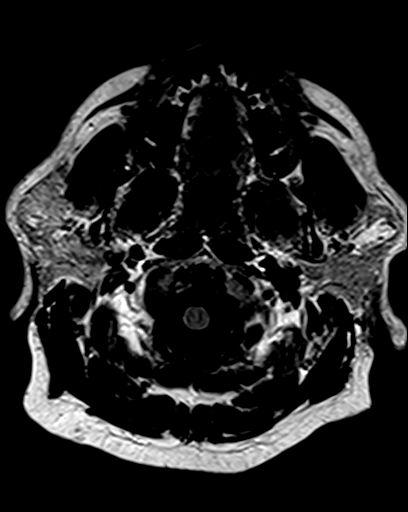
[im 24/24]
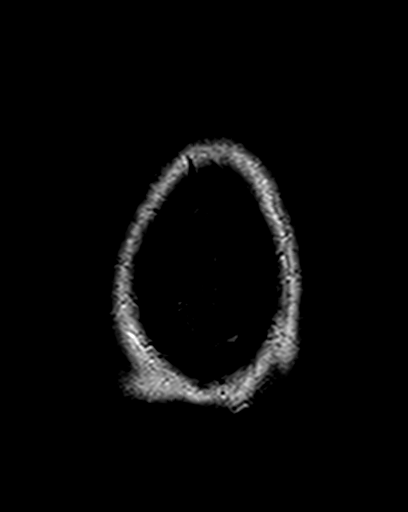

[Series 10: swi_images · axial · 5.0mm · 0.90mm/px · z∈[-28,+107]mm · 2 of 28 slices shown]
[im 1/28]
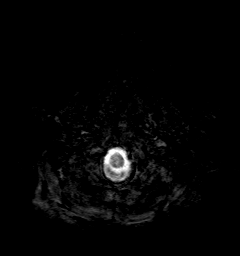
[im 28/28]
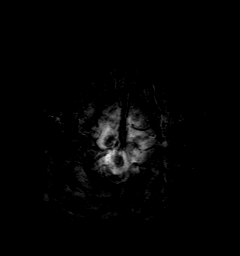

[Series 11: t1_mpr_tra · axial · 1.0mm · 0.75mm/px · z∈[-37,+106]mm · 10 of 144 slices shown (1 of 2)]
[im 1/144]
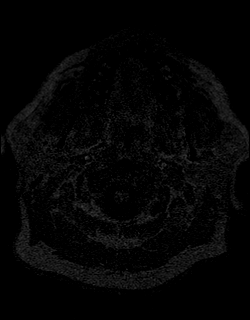
[im 16/144]
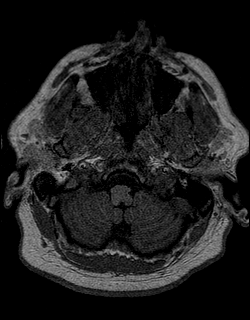
[im 32/144]
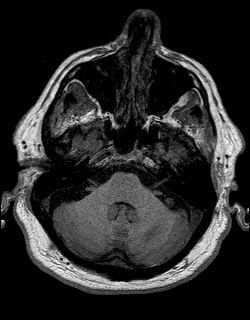
[im 48/144]
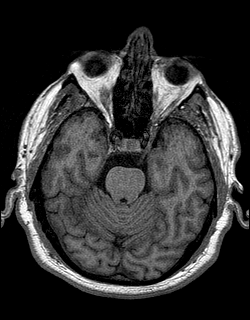
[im 64/144]
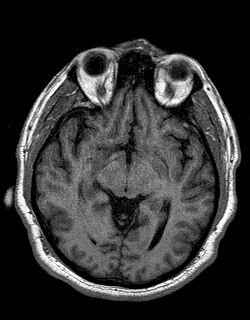
[im 80/144]
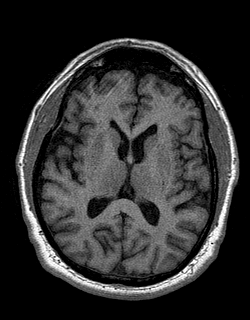
[im 96/144]
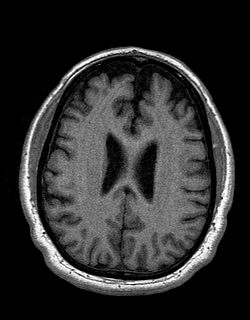
[im 112/144]
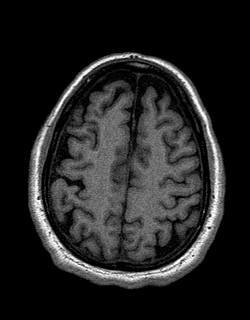
[im 128/144]
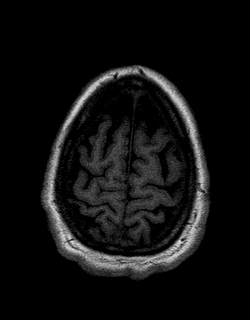
[im 144/144]
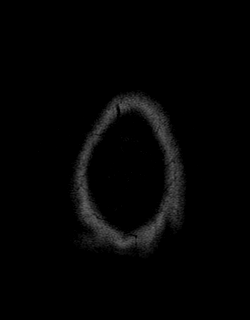

[Series 12: T2 · coronal · 5.0mm · 0.45mm/px · 2 of 25 slices shown (2 of 2)]
[im 1/25]
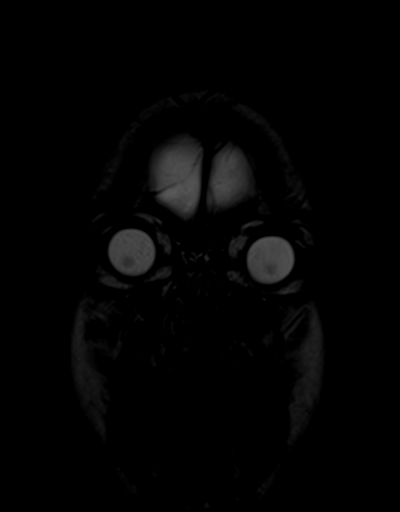
[im 25/25]
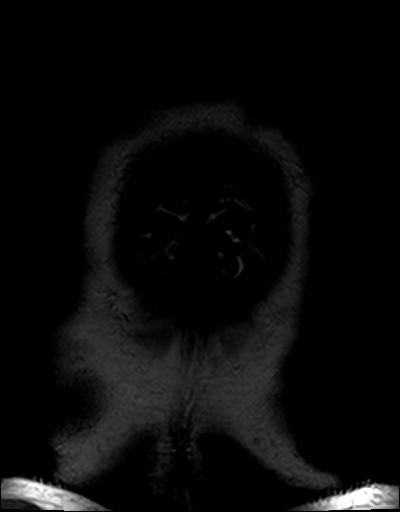

[Series 13: t1_mpr_tra · axial · 1.0mm · 0.75mm/px · z∈[-37,+106]mm · 10 of 144 slices shown (2 of 2)]
[im 1/144]
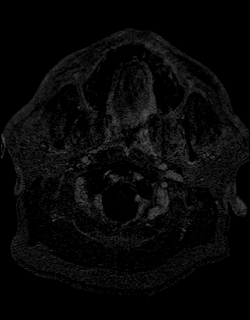
[im 16/144]
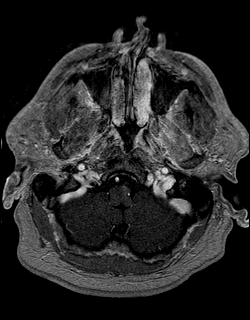
[im 32/144]
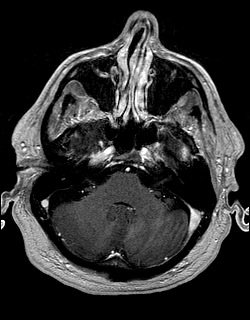
[im 48/144]
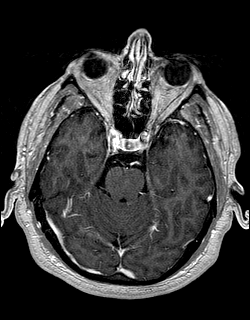
[im 64/144]
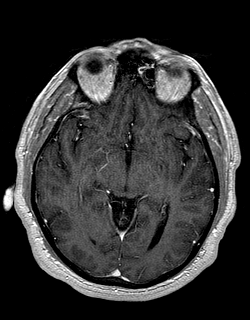
[im 80/144]
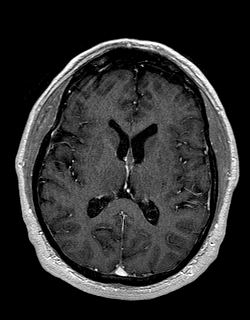
[im 96/144]
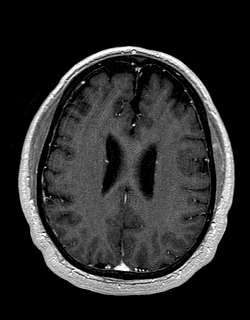
[im 112/144]
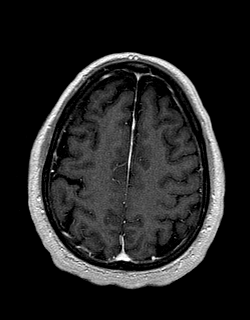
[im 128/144]
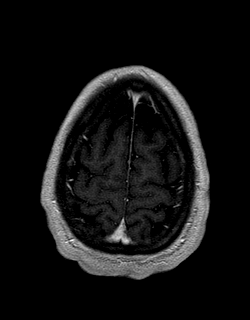
[im 144/144]
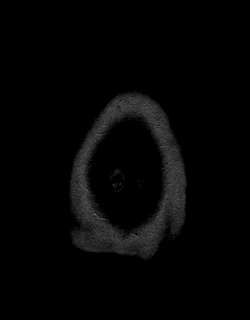

[Series 14: post cor · coronal · 5.0mm · 0.45mm/px · 2 of 25 slices shown]
[im 1/25]
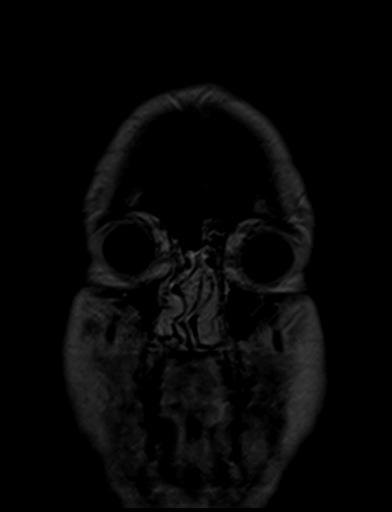
[im 25/25]
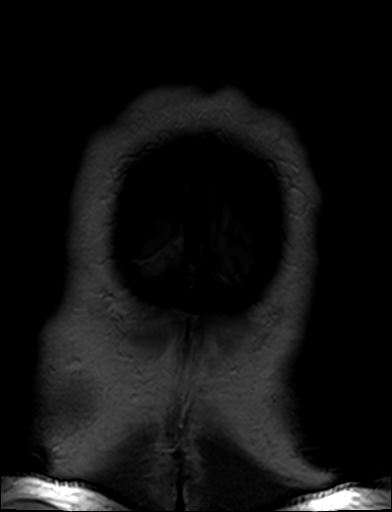

[Series 15: post sag (optional · sagittal · 5.0mm · 0.45mm/px · 1 of 21 slices shown]
[im 1/21]
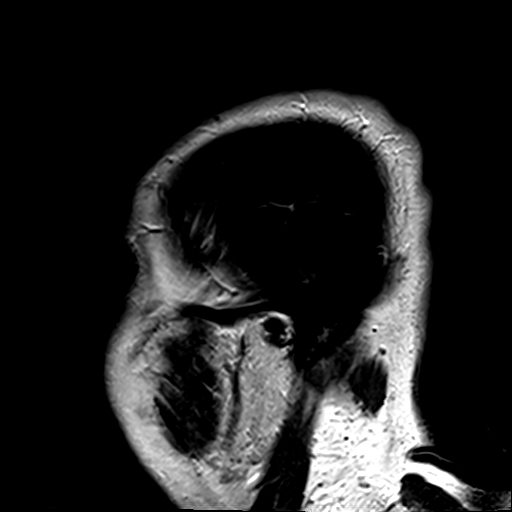

[48 of 48 positions shown; findings below may reference images not displayed]

FINDINGS: INTRACRANIAL CONTENTS: No reduced diffusion to suggest acute
ischemia or hypercellular tumor. No susceptibility artifact to
suggest hemorrhage. The ventricles and sulci are normal for
patient's age. A few punctate supratentorial white matter FLAIR T2
hyperintense is compatible chronic small vessel ischemic disease,
less than expected for age. No suspicious parenchymal signal,
masses, mass effect. No abnormal intraparenchymal or extra-axial
enhancement. No abnormal extra-axial fluid collections. No
extra-axial masses.

VASCULAR: Normal major intracranial vascular flow voids present at
skull base.

SKULL AND UPPER CERVICAL SPINE: No abnormal sellar expansion. No
suspicious calvarial bone marrow signal. Low signal pannus posterior
to the odontoid process compatible with CPPD mildly narrowing the
ventral cerebral spinal fluid space. Craniocervical junction
maintained.

SINUSES/ORBITS: Trace paranasal sepsis mucosal thickening. Mastoid
air cells are well aerated. The included ocular globes and orbital
contents are non-suspicious.

OTHER: None.
IMPRESSION: No findings of intracranial metastasis; normal MRI of the head with
and without contrast for age.

## 2017-11-28 IMAGING — DX DG CHEST 1V PORT
1 series · 1 of 1 positions shown · non-contrast
Comparison: 08/31/2016 .

CLINICAL DATA: Prior lung surgery .

EXAM:
PORTABLE CHEST 1 VIEW

[chest ap]
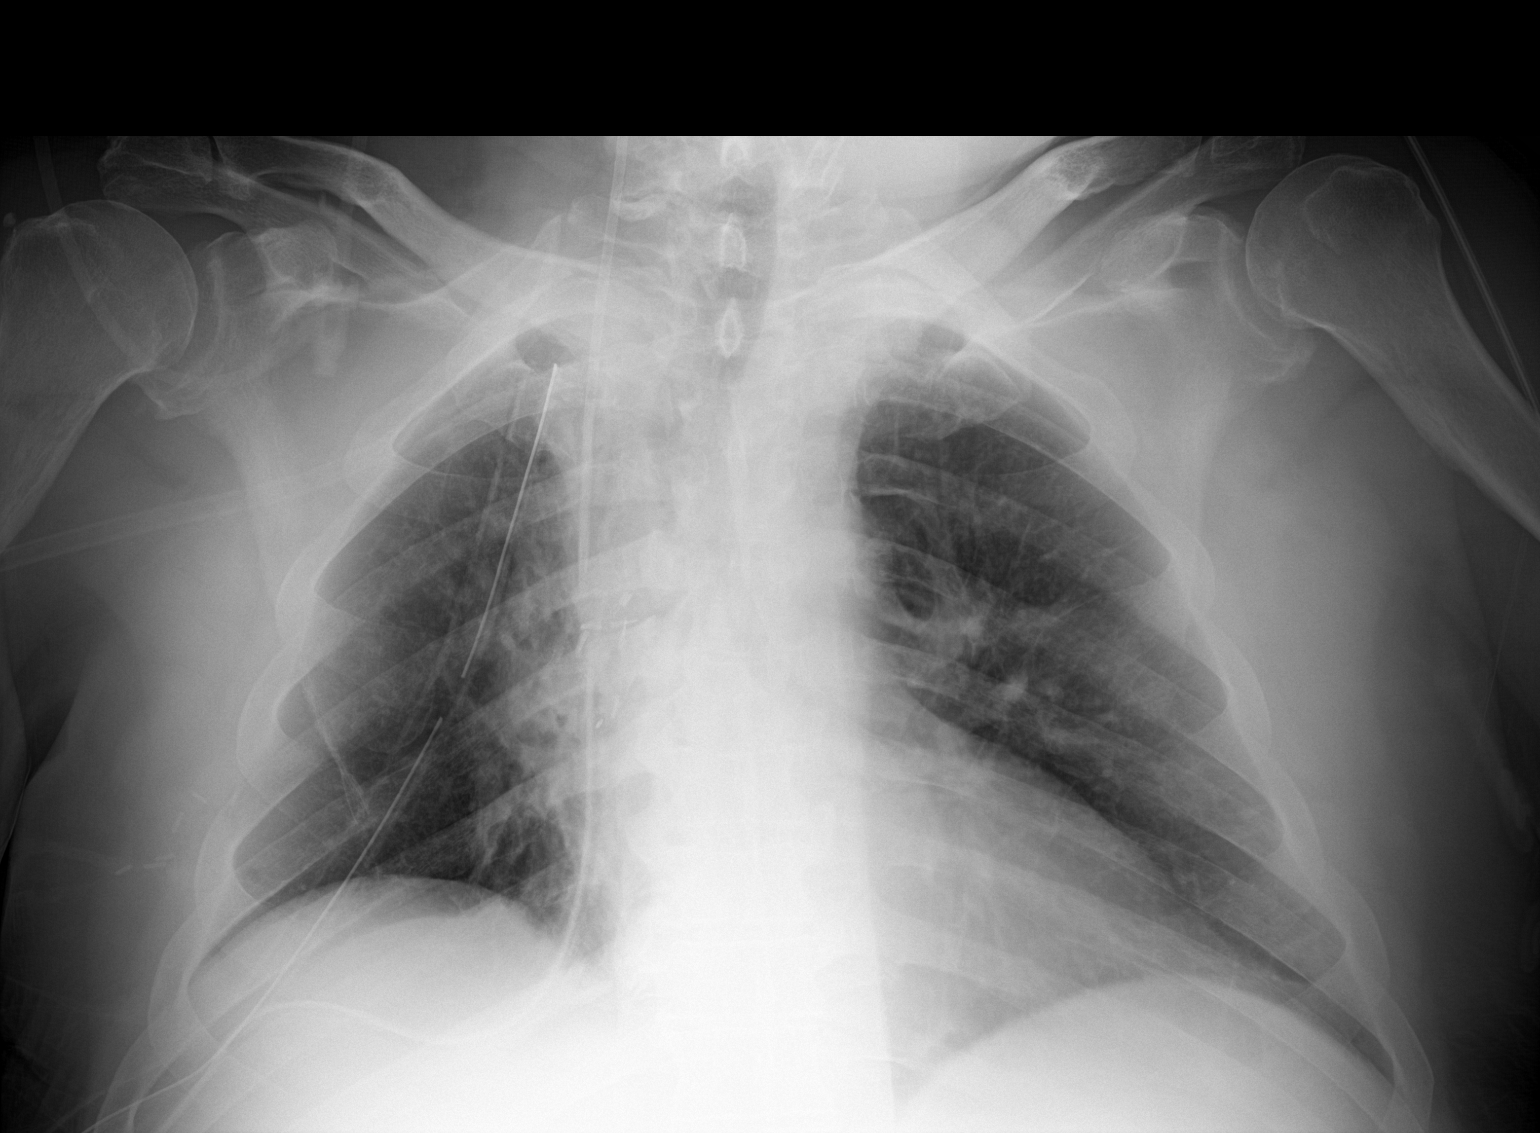

[1 of 1 positions shown; findings below may reference images not displayed]

FINDINGS: Right IJ line noted with tip over superior vena cava. Two right
chest tubes noted. No pneumothorax . Postsurgical changes right
lung. Left lung clear. Heart size stable. Degenerative changes
thoracic spine .
IMPRESSION: 1. Right IJ line noted with tip over the superior vena cava. Two
right chest tubes noted. No pneumothorax.

2. Postsurgical changes right lung.  No acute abnormality .

## 2017-11-29 IMAGING — CR DG CHEST 1V PORT
1 series · 1 of 1 positions shown · non-contrast
Comparison: Yesterday

CLINICAL DATA: Chest tube in place

EXAM:
PORTABLE CHEST 1 VIEW

[AP]
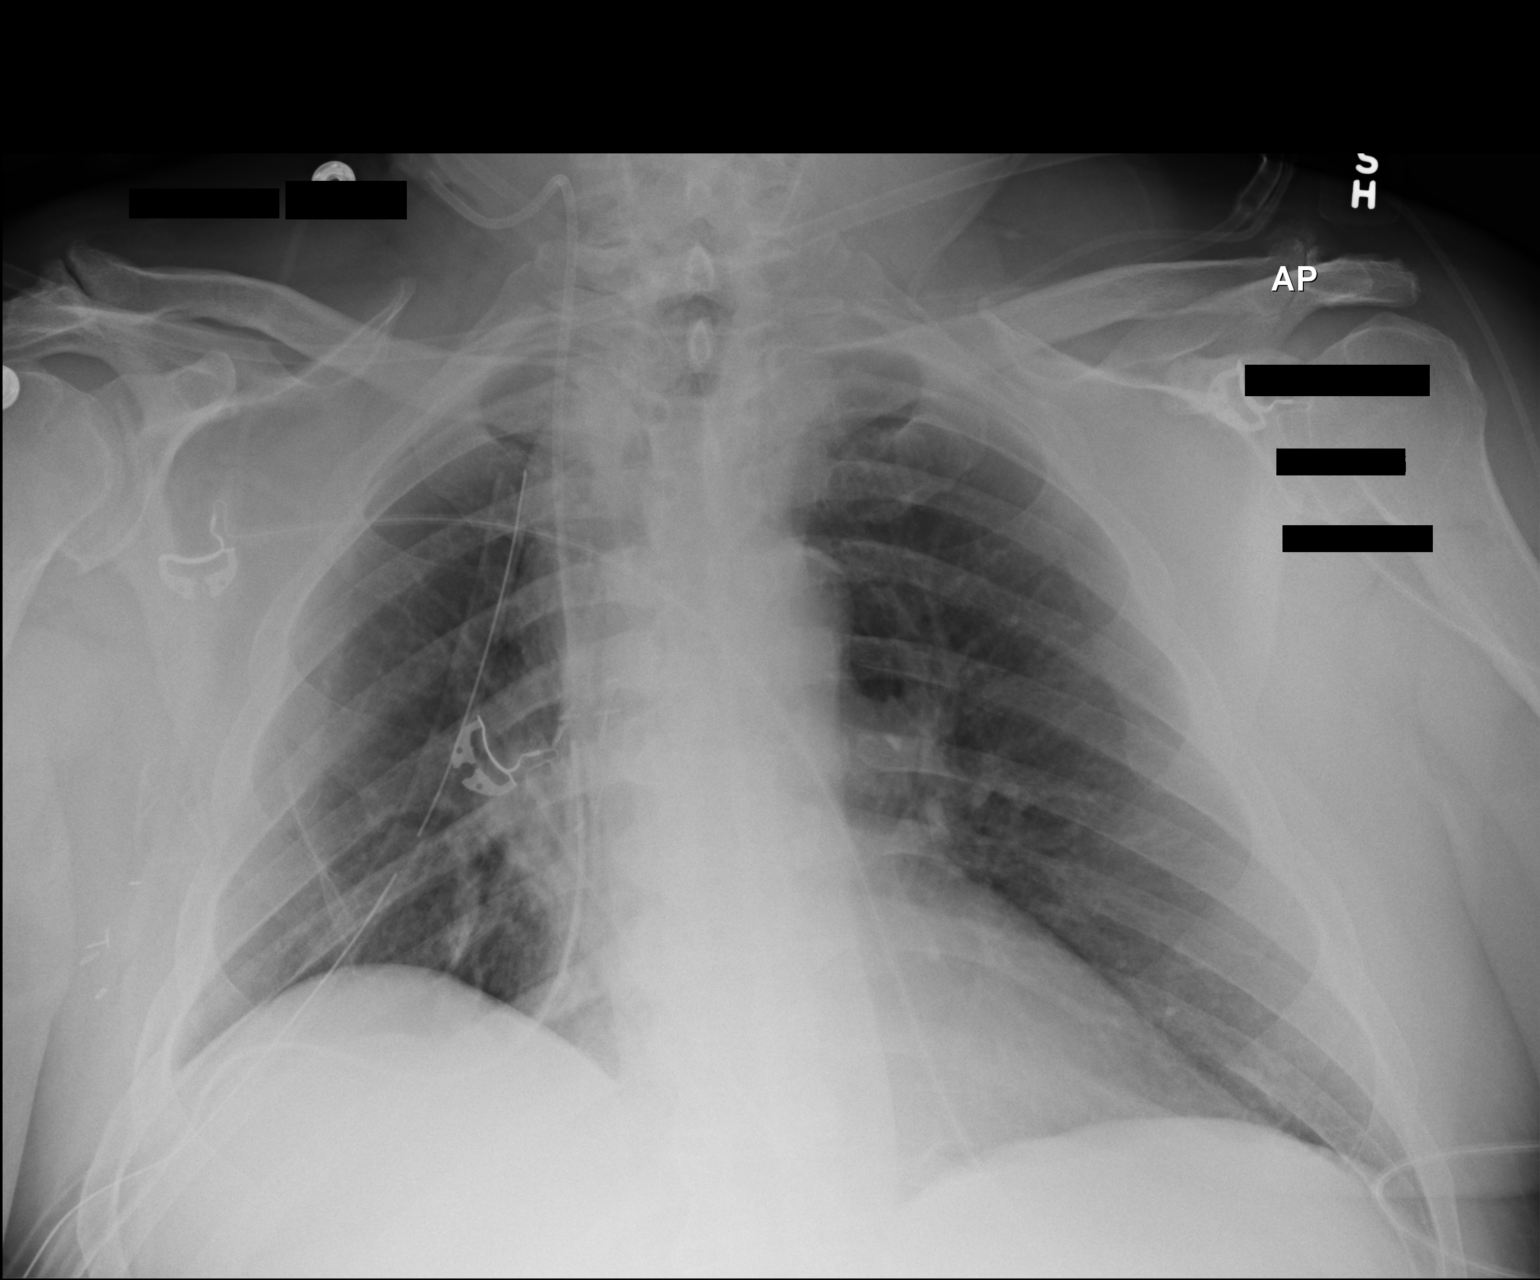

[1 of 1 positions shown; findings below may reference images not displayed]

FINDINGS: Right jugular venous catheter and right chest tubes are stable. Low
volumes. Postoperative changes at the medial right lung base.
Otherwise clear lungs. Normal heart size. No pneumothorax.
IMPRESSION: Stable right chest tubes without pneumothorax.

## 2017-11-30 MED FILL — TAGRISSO 80 MG TABLET: 80 | 30 days supply | Qty: 30 | Fill #1

## 2017-12-06 ENCOUNTER — Inpatient Hospital Stay (HOSPITAL_BASED_OUTPATIENT_CLINIC_OR_DEPARTMENT_OTHER): Payer: Medicare Other | Admitting: Internal Medicine

## 2017-12-06 ENCOUNTER — Encounter: Payer: Self-pay | Admitting: Internal Medicine

## 2017-12-06 ENCOUNTER — Telehealth: Payer: Self-pay | Admitting: Internal Medicine

## 2017-12-06 ENCOUNTER — Inpatient Hospital Stay: Payer: Medicare Other

## 2017-12-06 VITALS — BP 119/70 | HR 73 | Temp 98.2°F | Resp 17 | Ht 69.0 in | Wt 220.0 lb

## 2017-12-06 DIAGNOSIS — E119 Type 2 diabetes mellitus without complications: Secondary | ICD-10-CM

## 2017-12-06 DIAGNOSIS — C3491 Malignant neoplasm of unspecified part of right bronchus or lung: Secondary | ICD-10-CM

## 2017-12-06 DIAGNOSIS — I10 Essential (primary) hypertension: Secondary | ICD-10-CM

## 2017-12-06 DIAGNOSIS — J90 Pleural effusion, not elsewhere classified: Secondary | ICD-10-CM

## 2017-12-06 DIAGNOSIS — C3490 Malignant neoplasm of unspecified part of unspecified bronchus or lung: Secondary | ICD-10-CM

## 2017-12-06 DIAGNOSIS — C3411 Malignant neoplasm of upper lobe, right bronchus or lung: Secondary | ICD-10-CM

## 2017-12-06 DIAGNOSIS — Z5111 Encounter for antineoplastic chemotherapy: Secondary | ICD-10-CM

## 2017-12-06 LAB — CBC WITH DIFFERENTIAL (CANCER CENTER ONLY)
Basophils Absolute: 0 10*3/uL (ref 0.0–0.1)
Basophils Relative: 0 %
Eosinophils Absolute: 0.1 10*3/uL (ref 0.0–0.5)
Eosinophils Relative: 2 %
HCT: 46.8 % (ref 38.4–49.9)
Hemoglobin: 15.5 g/dL (ref 13.0–17.1)
Lymphocytes Relative: 17 %
Lymphs Abs: 1.1 10*3/uL (ref 0.9–3.3)
MCH: 29.8 pg (ref 27.2–33.4)
MCHC: 33.1 g/dL (ref 32.0–36.0)
MCV: 89.8 fL (ref 79.3–98.0)
Monocytes Absolute: 0.7 10*3/uL (ref 0.1–0.9)
Monocytes Relative: 10 %
Neutro Abs: 4.5 10*3/uL (ref 1.5–6.5)
Neutrophils Relative %: 71 %
Platelet Count: 138 10*3/uL — ABNORMAL LOW (ref 140–400)
RBC: 5.21 MIL/uL (ref 4.20–5.82)
RDW: 15 % — ABNORMAL HIGH (ref 11.0–14.6)
WBC Count: 6.3 10*3/uL (ref 4.0–10.3)

## 2017-12-06 LAB — CMP (CANCER CENTER ONLY)
ALT: 32 U/L (ref 0–44)
AST: 29 U/L (ref 15–41)
Albumin: 4.1 g/dL (ref 3.5–5.0)
Alkaline Phosphatase: 66 U/L (ref 38–126)
Anion gap: 7 (ref 5–15)
BUN: 20 mg/dL (ref 8–23)
CO2: 25 mmol/L (ref 22–32)
Calcium: 9.4 mg/dL (ref 8.9–10.3)
Chloride: 108 mmol/L (ref 98–111)
Creatinine: 1.18 mg/dL (ref 0.61–1.24)
GFR, Est AFR Am: >60 mL/min (ref >60)
GFR, Estimated: >60 mL/min (ref >60)
Glucose, Bld: 115 mg/dL — ABNORMAL HIGH (ref 70–99)
Potassium: 4.6 mmol/L (ref 3.5–5.1)
Sodium: 140 mmol/L (ref 135–145)
Total Bilirubin: 0.5 mg/dL (ref 0.3–1.2)
Total Protein: 7.1 g/dL (ref 6.5–8.1)

## 2017-12-06 NOTE — Progress Notes (Signed)
[place] Telephone:([phone]) [phone]   Fax:(336) [phone]  OFFICE PROGRESS NOTE  [name], MD [address] [facility] [address] [place] [address]  DIAGNOSIS: Recurrent non-small cell lung cancer, adenocarcinoma initially diagnosed as stage IA (T1c, N0, M0) non-small cell lung cancer, adenocarcinoma presented with right upper lobe lung nodule in April 2018 with recurrence in June 2019.  Biomarker Findings Microsatellite status - MS-Stable Tumor Mutational Burden - TMB-Low (3 Muts/Mb) Genomic Findings For a complete list of the genes assayed, please refer to the Appendix. EGFR exon 19 deletion ([id number]>B) CDKN2A/B loss NKX2-1 amplification 7 Disease relevant genes with no reportable alterations: KRAS, ALK, BRAF, MET, RET, ERBB2, ROS1   PRIOR THERAPY: Status post right upper lobectomy with lymph node dissection under the care of Dr. [name] on 09/02/2016.  CURRENT THERAPY: Tagrisso 80 mg p.o. daily.  First dose started November 05, 2017.  INTERVAL HISTORY: James [name] 67 y.o. male returns to the clinic today for follow-up visit accompanied by his wife.  The patient is feeling fine today with no concerning complaints except for mild shortness of breath with exertion.  He denied having any recent chest pain, cough or hemoptysis.  He denied having any weight loss or night sweats.  He has no nausea, vomiting, diarrhea or constipation.  He denied having any skin rash.  He continues to tolerate his treatment with Tagrisso fairly well.  He is here today for evaluation and repeat blood work.   MEDICAL HISTORY: Past Medical History:  Diagnosis Date  . Adenocarcinoma of right lung, stage 1 ([place]) 09/06/2016  . Anxiety   . Arthritis    "knees, hips, back" (10/19/2012)  . Chronic diastolic congestive heart failure ([place])   . Chronic lower back pain   . Colon polyps    10/27/2002, repeat letter 09/17/2007  . Coronary artery disease   . Coronary artery disease involving  native coronary artery of native heart without angina pectoris   . Depressive disorder, not elsewhere classified    no meds  . Diabetes mellitus without complication ([place])    diet controlled- no med  (while in hosp 4/18 -elevated cbg  . Dyspnea   . Fasting hyperglycemia   . GERD (gastroesophageal reflux disease)   . Heart murmur   . Hemoptysis    abnormal CT Chest 01/29/10 - ? new GG changes RUL > not viz on plain cxr 02/26/2010  . Hypertension   . Mitral regurgitation    severe MR 08/2016  . MPN (myeloproliferative neoplasm) ([place])    1st detected 06/04/1998  . Obesity   . OSA on CPAP    last sleep study 10 years ago  . Other and unspecified hyperlipidemia   . Peripheral vascular disease ([place]) 08/2016   after lung surgey small clots in lungs,after hip dvt-5/16  . Pneumonia    4/18  . Positive PPD 1965   "non reactive in 2012" (10/19/2012)  . Routine general medical examination at a health care facility   . S/P CABG x 1 03/24/2017   LIMA to LAD  . S/P mitral valve repair 03/24/2017   Complex valvuloplasty including artificial Gore-tex neochord placement x6 and 28 mm Sorin Annuloflex posterior annuloplasty band  . Special screening for malignant neoplasm of prostate   . Spinal stenosis, unspecified region other than cervical   . Wrist pain, left     ALLERGIES:  is allergic to symbicort [budesonide-formoterol fumarate] and amoxicillin.  MEDICATIONS:  Current Outpatient Medications  Medication Sig Dispense Refill  . acetaminophen (TYLENOL) 325 MG tablet Take 650 mg by mouth every 6 (six) hours as needed for moderate pain or headache.    Marland Kitchen aspirin EC 81 MG tablet Take 1 tablet (81 mg total) by mouth daily. 90 tablet 3  . atorvastatin (LIPITOR) 20 MG tablet TAKE 1 TABLET (20 MG TOTAL) BY MOUTH DAILY. 90 tablet 0  . furosemide (LASIX) 40 MG tablet TAKE 1 TABLET BY MOUTH EVERY DAY 90 tablet 3  . LORazepam (ATIVAN) 1 MG tablet One tab 30 min before scan 2 tablet 0  . metoprolol  tartrate (LOPRESSOR) 25 MG tablet TAKE 1/2 TABLET BY MOUTH 2 TIMES DAILY 30 tablet 11  . Omega-3 Fatty Acids (FISH OIL) 500 MG CAPS Take 500 mg by mouth daily.     Marland Kitchen osimertinib mesylate (TAGRISSO) 80 MG tablet Take 1 tablet (80 mg total) by mouth daily. 30 tablet 2  . potassium chloride SA (K-DUR,KLOR-CON) 20 MEQ tablet Take 1 tablet (20 mEq total) by mouth daily as needed. With lasix 90 tablet 0  . tobramycin (TOBREX) 0.3 % ophthalmic solution Place 1 drop into the left eye every 6 (six) hours. 5 mL 0  . traMADol (ULTRAM) 50 MG tablet Take 1 tablet (50 mg total) by mouth every 6 (six) hours as needed (may take one or two tablets every six hrs prn). 30 tablet 0   No current facility-administered medications for this visit.     SURGICAL HISTORY:  Past Surgical History:  Procedure Laterality Date  . ANTERIOR CRUCIATE LIGAMENT REPAIR Left 1967  . CARDIAC CATHETERIZATION  2000  . CHEST TUBE INSERTION Right 10/19/2012   post bronch  . COLONOSCOPY W/ POLYPECTOMY    . CORONARY ARTERY BYPASS GRAFT N/A 03/24/2017   Procedure: CORONARY ARTERY BYPASS GRAFTING (CABG)x1 using left internal mammary artery, LIMA-LAD;  Surgeon: Rexene Alberts, MD;  Location: [facility];  Service: Open Heart Surgery;  Laterality: N/A;  . FLEXIBLE BRONCHOSCOPY  10/19/2012   Flexible video fiberoptic bronchoscopy with electromagnetic navigation and biopsies.  . INTRAVASCULAR PRESSURE WIRE/FFR STUDY N/A 08/11/2016   Procedure: Intravascular Pressure Wire/FFR Study;  Surgeon: [name], MD;  Location: [facility];  Service: Cardiovascular;  Laterality: N/A;  . LOBECTOMY Right 09/02/2016   Procedure: RIGHT UPPER LOBECTOMY;  Surgeon: Melrose Nakayama, MD;  Location: [facility];  Service: Thoracic;  Laterality: Right;  . LYMPH NODE DISSECTION Right 09/02/2016   Procedure: LYMPH NODE DISSECTION, RIGHT LUNG;  Surgeon: Melrose Nakayama, MD;  Location: [facility];  Service: Thoracic;  Laterality: Right;  . MITRAL VALVE REPAIR N/A  03/24/2017   Procedure: MITRAL VALVE REPAIR (MVR) with Sorin Carbomedics Annuloflex size 28;  Surgeon: Rexene Alberts, MD;  Location: [facility];  Service: Open Heart Surgery;  Laterality: N/A;  . RIGHT/LEFT HEART CATH AND CORONARY ANGIOGRAPHY N/A 08/11/2016   Procedure: Right/Left Heart Cath and Coronary Angiography;  Surgeon: [name], MD;  Location: [facility];  Service: Cardiovascular;  Laterality: N/A;  . TEE WITHOUT CARDIOVERSION N/A 07/17/2016   Procedure: TRANSESOPHAGEAL ECHOCARDIOGRAM (TEE);  Surgeon: [name], MD;  Location: [facility]OSCOPY;  Service: Cardiovascular;  Laterality: N/A;  . TEE WITHOUT CARDIOVERSION N/A 03/24/2017   Procedure: TRANSESOPHAGEAL ECHOCARDIOGRAM (TEE);  Surgeon: Rexene Alberts, MD;  Location: [facility];  Service: Open Heart Surgery;  Laterality: N/A;  . TONSILLECTOMY  1950's  . TOTAL HIP ARTHROPLASTY Left 10/05/2014   dr [name]  . TOTAL HIP ARTHROPLASTY  Left 10/05/2014   Procedure: LEFT TOTAL HIP ARTHROPLASTY ANTERIOR APPROACH;  Surgeon: [name], MD;  Location: [facility];  Service: Orthopedics;  Laterality: Left;  Marland Kitchen VIDEO ASSISTED THORACOSCOPY (VATS)/WEDGE RESECTION Right 09/02/2016   Procedure: VIDEO ASSISTED THORACOSCOPY (VATS)/RIGHT UPPER LOBE WEDGE RESECTION;  Surgeon: Melrose Nakayama, MD;  Location: [facility];  Service: Thoracic;  Laterality: Right;  Marland Kitchen VIDEO BRONCHOSCOPY WITH ENDOBRONCHIAL NAVIGATION N/A 10/19/2012   Procedure: VIDEO BRONCHOSCOPY WITH ENDOBRONCHIAL NAVIGATION;  Surgeon: [name], MD;  Location: [facility];  Service: Thoracic;  Laterality: N/A;  . WRIST RECONSTRUCTION Left 12/2009   'proximal row carpectomy" [name]    REVIEW OF SYSTEMS:  A comprehensive review of systems was negative except for: Respiratory: positive for dyspnea on exertion   PHYSICAL EXAMINATION: General appearance: alert, cooperative and no distress Head: Normocephalic, without obvious abnormality, atraumatic Neck: no adenopathy, no JVD, supple, symmetrical,  trachea midline and thyroid not enlarged, symmetric, no tenderness/mass/nodules Lymph nodes: Cervical, supraclavicular, and axillary nodes normal. Resp: clear to auscultation bilaterally Back: symmetric, no curvature. ROM normal. No CVA tenderness. Cardio: regular rate and rhythm, S1, S2 normal, no murmur, click, rub or gallop GI: soft, non-tender; bowel sounds normal; no masses,  no organomegaly Extremities: extremities normal, atraumatic, no cyanosis or edema  ECOG PERFORMANCE STATUS: 1 - Symptomatic but completely ambulatory  Blood pressure 119/70, pulse 73, temperature 98.2 F (36.8 C), temperature source Oral, resp. rate 17, height _0  (1.753 m), weight 220 lb (99.8 kg), SpO2 96 %.  LABORATORY DATA: Lab Results  Component Value Date   WBC 6.3 12/06/2017   HGB 15.5 12/06/2017   HCT 46.8 12/06/2017   MCV 89.8 12/06/2017   PLT 138 (L) 12/06/2017      Chemistry      Component Value Date/Time   NA 139 11/24/2017 1043   NA 142 10/01/2016 1534   K 4.4 11/24/2017 1043   K 4.2 10/01/2016 1534   CL 108 11/24/2017 1043   CO2 25 11/24/2017 1043   CO2 28 10/01/2016 1534   BUN 21 11/24/2017 1043   BUN 15.9 10/01/2016 1534   CREATININE 1.27 (H) 11/24/2017 1043   CREATININE 1.10 02/08/2017 1101   CREATININE 1.5 (H) 10/01/2016 1534      Component Value Date/Time   CALCIUM 9.5 11/24/2017 1043   CALCIUM 9.6 10/01/2016 1534   ALKPHOS 66 11/24/2017 1043   ALKPHOS 65 10/01/2016 1534   AST 31 11/24/2017 1043   AST 20 10/01/2016 1534   ALT 36 11/24/2017 1043   ALT 24 10/01/2016 1534   BILITOT 0.6 11/24/2017 1043   BILITOT 0.46 10/01/2016 1534       RADIOGRAPHIC STUDIES: Dg Chest 1 View  Result Date: 11/12/2017 CLINICAL DATA:  Status post right-sided thoracentesis EXAM: CHEST  1 VIEW COMPARISON:  October 13, 2017 FINDINGS: No appreciable pneumothorax. There is a small right pleural effusion with right base atelectasis. Lungs elsewhere are clear. Heart size and pulmonary vascularity  are normal. No adenopathy. Patient is status post median sternotomy. There are surgical clips in the lateral right hemithorax, stable. IMPRESSION: No pneumothorax. Small right pleural effusion with mild right base atelectasis. Lungs elsewhere clear. Stable cardiac silhouette. No evident adenopathy. Electronically Signed   By: Lowella Grip III M.D.   On: 11/12/2017 14:20   Ct Chest W Contrast  Result Date: 11/22/2017 CLINICAL DATA:  Right upper lobe adenocarcinoma post wedge resection 09/02/2016. Immunotherapy ongoing. EXAM: CT CHEST WITH CONTRAST TECHNIQUE: Multidetector CT imaging of the chest was performed  during intravenous contrast administration. CONTRAST:  1m ISOVUE-300 IOPAMIDOL (ISOVUE-300) INJECTION 61% COMPARISON:  Chest CT 10/01/2017.  PET-CT 10/25/2017. FINDINGS: Cardiovascular: Atherosclerosis of aorta, great vessels and coronary arteries. The pulmonary arteries appear patent. Patient is status post mitral valve repair. The heart size is normal. There is no pericardial effusion. Mediastinum/Nodes: There are no enlarged mediastinal, hilar or axillary lymph nodes.Right infrahilar node measures 8 mm on image 81/2, compared with 10 mm on PET-CT. The thyroid gland, trachea and esophagus demonstrate no significant findings. Lungs/Pleura: Subpulmonic right pleural effusion has mildly decreased in volume and appears simple. There is no left pleural effusion or pneumothorax. Status post right upper lobectomy. The overall aeration of the right lung base has improved. The subpleural nodularity previously described posteriorly in the right lower lobe appears less discrete, most likely reflecting rounded atelectasis. The 6 mm right lower lobe nodule on image 67/7 is unchanged and consistent with a benign finding based on stability. The clustered left upper lobe nodularity is stable, best seen on image 49/7. No new or enlarging pulmonary nodules. Upper abdomen: There are stable hepatic cysts. No new or  enlarging hepatic lesions. The adrenal glands appear normal. Musculoskeletal/Chest wall: There is no chest wall mass or suspicious osseous finding. Prior median sternotomy and thoracic spondylosis noted. IMPRESSION: 1. No evidence of local recurrence or metastatic disease. 2. Interval slight decrease in right pleural effusion with improved aeration of the right lower lobe. 3. Improve subpleural nodularity in the right lower lobe, probably rounded atelectasis. Stable clustered nodularity posteriorly in the left upper lobe, likely inflammatory. No suspicious pulmonary nodules. 4.  Aortic Atherosclerosis (ICD10-I70.0). Electronically Signed   By: WRichardean SaleM.D.   On: 11/22/2017 15:39   [place]oreaThoracentesis Asp Pleural Space W/img Guide  Result Date: 11/12/2017 INDICATION: Patient with history of adenocarcinoma of right lung with recurrent malignant right pleural effusion, dyspnea. Request made for therapeutic right thoracentesis. EXAM: ULTRASOUND GUIDED THERAPEUTIC RIGHT THORACENTESIS MEDICATIONS: None COMPLICATIONS: None immediate. PROCEDURE: An ultrasound guided thoracentesis was thoroughly discussed with the patient and questions answered. The benefits, risks, alternatives and complications were also discussed. The patient understands and wishes to proceed with the procedure. Written consent was obtained. Ultrasound was performed to localize and mark an adequate pocket of fluid in the right chest. The area was then prepped and draped in the normal sterile fashion. 1% Lidocaine was used for local anesthesia. Under ultrasound guidance a 6 Fr Safe-T-Centesis catheter was introduced. Thoracentesis was performed. The catheter was removed and a dressing applied. FINDINGS: A total of approximately 950 cc of yellow fluid was removed. IMPRESSION: Successful ultrasound guided therapeutic right thoracentesis yielding 950 cc of pleural fluid. Read by: KRowe Robert PA-C Electronically Signed   By: JSandi MariscalM.D.   On:  11/12/2017 14:11    ASSESSMENT AND PLAN: This is a very pleasant 67years old white male with a stage Ia non-small cell lung cancer status post right upper lobectomy with lymph node dissection on September 02, 2016. The patient has been in observation since that time. Unfortunately there are some concerning findings with nodular density in the posterior right lower lobe as well as increase and right pleural effusion suspicious for disease recurrence. He recently underwent ultrasound-guided right thoracentesis and the cytology of the pleural fluid was consistent with recurrent adenocarcinoma. He had molecular studies performed by [facility] 1 that showed positive EGFR mutation with deletion 1102 The patient was started on treatment with Tagrisso 80 mg p.o. daily on 11/05/2017. The patient continues  to tolerate this treatment well with no concerning adverse effects. I recommended for him to continue his treatment with Tagrisso with the same dose. I will see him back for follow-up visit in 1 month for evaluation after repeating CT scan of the chest, abdomen and pelvis for restaging of his disease. He was advised to call immediately if he has any concerning symptoms in the interval. The patient voices understanding of current disease status and treatment options and is in agreement with the current care plan. I spent 10 minutes counseling the patient face to face. The total time spent in the appointment was 15 minutes.  All questions were answered. The patient knows to call the clinic with any problems, questions or concerns. We can certainly see the patient much sooner if necessary.  Disclaimer: This note was dictated with voice recognition software. Similar sounding words can inadvertently be transcribed and may not be corrected upon review.

## 2017-12-06 NOTE — Telephone Encounter (Signed)
Scheduled appt per 7/29 los - gave patient AVS and calender per los. - central radiology to contact patient with ct scan.

## 2017-12-10 ENCOUNTER — Other Ambulatory Visit: Payer: Self-pay | Admitting: Internal Medicine

## 2017-12-10 DIAGNOSIS — E785 Hyperlipidemia, unspecified: Secondary | ICD-10-CM

## 2017-12-20 ENCOUNTER — Encounter: Payer: Self-pay | Admitting: Family

## 2017-12-20 ENCOUNTER — Ambulatory Visit (INDEPENDENT_AMBULATORY_CARE_PROVIDER_SITE_OTHER): Payer: Medicare Other | Admitting: Family

## 2017-12-20 VITALS — BP 140/82 | HR 56 | Temp 97.7°F | Ht 69.0 in | Wt 219.4 lb

## 2017-12-20 DIAGNOSIS — J209 Acute bronchitis, unspecified: Secondary | ICD-10-CM

## 2017-12-20 MED ORDER — DOXYCYCLINE HYCLATE 100 MG PO TABS: 100.0000 mg | ORAL_TABLET | Freq: Two times a day (BID) | ORAL | 0 refills | Status: DC

## 2017-12-20 NOTE — Progress Notes (Signed)
James [name] is a 66 y.o. male with the following history as recorded in EpicCare:  Patient Active Problem List   Diagnosis Date Noted  . Encounter for antineoplastic chemotherapy 11/03/2017  . Adenocarcinoma of right lung, stage 4 ([place]) 10/15/2017  . Goals of care, counseling/discussion 10/15/2017  . Deficiency anemia 07/28/2017  . Need for shingles vaccine 07/28/2017  . S/P mitral valve repair  03/24/2017  . S/P CABG x 1 03/24/2017  . Coronary artery disease involving native coronary artery of native heart without angina pectoris   . Chronic diastolic congestive heart failure ([place])   . Primary insomnia 10/27/2016  . Pulmonary embolus ([place]) 10/22/2016  . Adenocarcinoma of right lung, stage 1 ([place]) 09/06/2016  . OA (osteoarthritis) of hip 10/05/2014  . OSA on CPAP 07/02/2014  . Chronic venous insufficiency 07/02/2014  . BPH with obstruction/lower urinary tract symptoms 07/12/2013  . Allergic rhinitis 07/12/2013  . Routine health maintenance 07/31/2011  . Hypogonadism male 07/29/2011  . Obesity 06/08/2007  . Type II diabetes mellitus with manifestations ([place]) 06/08/2007  . Hyperlipidemia with target LDL less than 130 01/18/2007  . GERD 01/18/2007  . Spinal stenosis of lumbar region 01/18/2007    Current Outpatient Medications  Medication Sig Dispense Refill  . acetaminophen (TYLENOL) 325 MG tablet Take 650 mg by mouth every 6 (six) hours as needed for moderate pain or headache.    Marland Kitchen aspirin EC 81 MG tablet Take 1 tablet (81 mg total) by mouth daily. 90 tablet 3  . atorvastatin (LIPITOR) 20 MG tablet TAKE 1 TABLET (20 MG TOTAL) BY MOUTH DAILY. 90 tablet 0  . furosemide (LASIX) 40 MG tablet TAKE 1 TABLET BY MOUTH EVERY DAY 90 tablet 3  . LORazepam (ATIVAN) 1 MG tablet One tab 30 min before scan 2 tablet 0  . metoprolol tartrate (LOPRESSOR) 25 MG tablet TAKE 1/2 TABLET BY MOUTH 2 TIMES DAILY 30 tablet 11  . Omega-3 Fatty Acids (FISH OIL) 500 MG CAPS Take 500 mg by mouth daily.      Marland Kitchen osimertinib mesylate (TAGRISSO) 80 MG tablet Take 1 tablet (80 mg total) by mouth daily. 30 tablet 2  . potassium chloride SA (K-DUR,KLOR-CON) 20 MEQ tablet Take 1 tablet (20 mEq total) by mouth daily as needed. With lasix 90 tablet 0  . traMADol (ULTRAM) 50 MG tablet Take 1 tablet (50 mg total) by mouth every 6 (six) hours as needed (may take one or two tablets every six hrs prn). 30 tablet 0  . doxycycline (VIBRA-TABS) 100 MG tablet Take 1 tablet (100 mg total) by mouth 2 (two) times daily. 20 tablet 0   No current facility-administered medications for this visit.     Allergies: Symbicort [budesonide-formoterol fumarate] and Amoxicillin  Past Medical History:  Diagnosis Date  . Adenocarcinoma of right lung, stage 1 ([place]) 09/06/2016  . Anxiety   . Arthritis    "knees, hips, back" (10/19/2012)  . Chronic diastolic congestive heart failure ([place])   . Chronic lower back pain   . Colon polyps    10/27/2002, repeat letter 09/17/2007  . Coronary artery disease   . Coronary artery disease involving native coronary artery of native heart without angina pectoris   . Depressive disorder, not elsewhere classified    no meds  . Diabetes mellitus without complication ([place])    diet controlled- no med  (while in hosp 4/18 -elevated cbg  . Dyspnea   . Fasting hyperglycemia   . GERD (gastroesophageal reflux disease)   .  Heart murmur   . Hemoptysis    abnormal CT Chest 01/29/10 - ? new GG changes RUL > not viz on plain cxr 02/26/2010  . Hypertension   . Mitral regurgitation    severe MR 08/2016  . MPN (myeloproliferative neoplasm) ([place])    1st detected 06/04/1998  . Obesity   . OSA on CPAP    last sleep study 10 years ago  . Other and unspecified hyperlipidemia   . Peripheral vascular disease ([place]) 08/2016   after lung surgey small clots in lungs,after hip dvt-5/16  . Pneumonia    4/18  . Positive PPD 1965   "non reactive in 2012" (10/19/2012)  . Routine general medical examination at a health  care facility   . S/P CABG x 1 03/24/2017   LIMA to LAD  . S/P mitral valve repair 03/24/2017   Complex valvuloplasty including artificial Gore-tex neochord placement x6 and 28 mm Sorin Annuloflex posterior annuloplasty band  . Special screening for malignant neoplasm of prostate   . Spinal stenosis, unspecified region other than cervical   . Wrist pain, left     Past Surgical History:  Procedure Laterality Date  . ANTERIOR CRUCIATE LIGAMENT REPAIR Left 1967  . CARDIAC CATHETERIZATION  2000  . CHEST TUBE INSERTION Right 10/19/2012   post bronch  . COLONOSCOPY W/ POLYPECTOMY    . CORONARY ARTERY BYPASS GRAFT N/A 03/24/2017   Procedure: CORONARY ARTERY BYPASS GRAFTING (CABG)x1 using left internal mammary artery, LIMA-LAD;  Surgeon: Rexene Alberts, MD;  Location: [facility];  Service: Open Heart Surgery;  Laterality: N/A;  . FLEXIBLE BRONCHOSCOPY  10/19/2012   Flexible video fiberoptic bronchoscopy with electromagnetic navigation and biopsies.  . INTRAVASCULAR PRESSURE WIRE/FFR STUDY N/A 08/11/2016   Procedure: Intravascular Pressure Wire/FFR Study;  Surgeon: [name], MD;  Location: [facility];  Service: Cardiovascular;  Laterality: N/A;  . LOBECTOMY Right 09/02/2016   Procedure: RIGHT UPPER LOBECTOMY;  Surgeon: Melrose Nakayama, MD;  Location: [facility];  Service: Thoracic;  Laterality: Right;  . LYMPH NODE DISSECTION Right 09/02/2016   Procedure: LYMPH NODE DISSECTION, RIGHT LUNG;  Surgeon: Melrose Nakayama, MD;  Location: [facility];  Service: Thoracic;  Laterality: Right;  . MITRAL VALVE REPAIR N/A 03/24/2017   Procedure: MITRAL VALVE REPAIR (MVR) with Sorin Carbomedics Annuloflex size 28;  Surgeon: Rexene Alberts, MD;  Location: [facility];  Service: Open Heart Surgery;  Laterality: N/A;  . RIGHT/LEFT HEART CATH AND CORONARY ANGIOGRAPHY N/A 08/11/2016   Procedure: Right/Left Heart Cath and Coronary Angiography;  Surgeon: [name], MD;  Location: [facility];  Service:  Cardiovascular;  Laterality: N/A;  . TEE WITHOUT CARDIOVERSION N/A 07/17/2016   Procedure: TRANSESOPHAGEAL ECHOCARDIOGRAM (TEE);  Surgeon: [name], MD;  Location: [facility];  Service: Cardiovascular;  Laterality: N/A;  . TEE WITHOUT CARDIOVERSION N/A 03/24/2017   Procedure: TRANSESOPHAGEAL ECHOCARDIOGRAM (TEE);  Surgeon: Rexene Alberts, MD;  Location: [facility];  Service: Open Heart Surgery;  Laterality: N/A;  . TONSILLECTOMY  1950's  . TOTAL HIP ARTHROPLASTY Left 10/05/2014   dr [name]  . TOTAL HIP ARTHROPLASTY Left 10/05/2014   Procedure: LEFT TOTAL HIP ARTHROPLASTY ANTERIOR APPROACH;  Surgeon: [name], MD;  Location: [facility];  Service: Orthopedics;  Laterality: Left;  Marland Kitchen VIDEO ASSISTED THORACOSCOPY (VATS)/WEDGE RESECTION Right 09/02/2016   Procedure: VIDEO ASSISTED THORACOSCOPY (VATS)/RIGHT UPPER LOBE WEDGE RESECTION;  Surgeon: Melrose Nakayama, MD;  Location: [facility];  Service: Thoracic;  Laterality: Right;  .  VIDEO BRONCHOSCOPY WITH ENDOBRONCHIAL NAVIGATION N/A 10/19/2012   Procedure: VIDEO BRONCHOSCOPY WITH ENDOBRONCHIAL NAVIGATION;  Surgeon: [name], MD;  Location: [facility];  Service: Thoracic;  Laterality: N/A;  . WRIST RECONSTRUCTION Left 12/2009   'proximal row carpectomy" [name]    Family History  Problem Relation Age of Onset  . Heart failure Mother   . Heart disease Father   . Heart attack Father   . Heart disease Paternal Uncle   . Prostate cancer Neg Hx   . Colon cancer Neg Hx   . Hypertension Neg Hx   . Hyperlipidemia Neg Hx   . Diabetes Neg Hx     Social History   Tobacco Use  . Smoking status: Never Smoker  . Smokeless tobacco: Never Used  Substance Use Topics  . Alcohol use: Yes    Alcohol/week: 1.0 standard drinks    Types: 1 Cans of beer per week    Comment: none since 3 weeks    Subjective:  Patient presents with concerns for productive cough/ congestion; + thick, yellow mucus; + right ear painful; increased fatigue; + sore throat; concerns  for "deep cough." felt feverish; symptoms started last Wedenesday with symptoms; kept grandson with similar symptoms prior to onset of symptoms; using Robtussin DM and Tylenol;   Objective:  Vitals:   12/20/17 1045  BP: 140/82  Pulse: (!) 56  Temp: 97.7 F (36.5 C)  TempSrc: Oral  SpO2: 96%  Weight: 219 lb 6.4 oz (99.5 kg)  Height: 5\' 9"  (1.753 m)    General: Well developed, well nourished, in no acute distress  Skin : Warm and dry.  Head: Normocephalic and atraumatic  Eyes: Sclera and conjunctiva clear; pupils round and reactive to light; extraocular movements intact  Ears: External normal; canals clear; tympanic membranes normal  Oropharynx: Pink, supple. No suspicious lesions  Neck: Supple without thyromegaly, adenopathy  Lungs: Respirations unlabored; clear to auscultation bilaterally without wheeze, rales, rhonchi  CVS exam: normal rate and regular rhythm.  Neurologic: Alert and oriented; speech intact; face symmetrical; moves all extremities well; CNII-XII intact without focal deficit   Assessment:  1. Acute bronchitis, unspecified organism     Plan:  Rx for Doxycycline 100 mg bid x 10 days; continue OTC Robitussin DM; increase fluids, rest and follow-up worse, no better.   No follow-ups on file.  No orders of the defined types were placed in this encounter.   Requested Prescriptions   Signed Prescriptions Disp Refills  . doxycycline (VIBRA-TABS) 100 MG tablet 20 tablet 0    Sig: Take 1 tablet (100 mg total) by mouth 2 (two) times daily.

## 2018-01-03 ENCOUNTER — Inpatient Hospital Stay: Payer: Medicare Other | Attending: Internal Medicine

## 2018-01-03 ENCOUNTER — Ambulatory Visit (HOSPITAL_COMMUNITY)
Admission: RE | Admit: 2018-01-03 | Discharge: 2018-01-03 | Disposition: A | Discharge status: Home / Self Care | Payer: Medicare Other | Source: Ambulatory Visit | Attending: Internal Medicine | Admitting: Internal Medicine

## 2018-01-03 ENCOUNTER — Other Ambulatory Visit: Payer: Medicare Other

## 2018-01-03 DIAGNOSIS — J90 Pleural effusion, not elsewhere classified: Secondary | ICD-10-CM | POA: Insufficient documentation

## 2018-01-03 DIAGNOSIS — I10 Essential (primary) hypertension: Secondary | ICD-10-CM | POA: Insufficient documentation

## 2018-01-03 DIAGNOSIS — C3490 Malignant neoplasm of unspecified part of unspecified bronchus or lung: Secondary | ICD-10-CM

## 2018-01-03 DIAGNOSIS — N400 Enlarged prostate without lower urinary tract symptoms: Secondary | ICD-10-CM | POA: Diagnosis not present

## 2018-01-03 DIAGNOSIS — I700 Atherosclerosis of aorta: Secondary | ICD-10-CM | POA: Diagnosis not present

## 2018-01-03 DIAGNOSIS — N281 Cyst of kidney, acquired: Secondary | ICD-10-CM | POA: Insufficient documentation

## 2018-01-03 DIAGNOSIS — K7689 Other specified diseases of liver: Secondary | ICD-10-CM | POA: Insufficient documentation

## 2018-01-03 DIAGNOSIS — E119 Type 2 diabetes mellitus without complications: Secondary | ICD-10-CM | POA: Insufficient documentation

## 2018-01-03 DIAGNOSIS — J9811 Atelectasis: Secondary | ICD-10-CM | POA: Insufficient documentation

## 2018-01-03 DIAGNOSIS — Z951 Presence of aortocoronary bypass graft: Secondary | ICD-10-CM | POA: Insufficient documentation

## 2018-01-03 DIAGNOSIS — Z85118 Personal history of other malignant neoplasm of bronchus and lung: Secondary | ICD-10-CM | POA: Insufficient documentation

## 2018-01-03 LAB — CBC WITH DIFFERENTIAL (CANCER CENTER ONLY)
Basophils Absolute: 0 10*3/uL (ref 0.0–0.1)
Basophils Relative: 0 %
Eosinophils Absolute: 0.2 10*3/uL (ref 0.0–0.5)
Eosinophils Relative: 3 %
HCT: 46.6 % (ref 38.4–49.9)
Hemoglobin: 15.5 g/dL (ref 13.0–17.1)
Lymphocytes Relative: 17 %
Lymphs Abs: 1.1 10*3/uL (ref 0.9–3.3)
MCH: 29.9 pg (ref 27.2–33.4)
MCHC: 33.3 g/dL (ref 32.0–36.0)
MCV: 90 fL (ref 79.3–98.0)
Monocytes Absolute: 0.7 10*3/uL (ref 0.1–0.9)
Monocytes Relative: 11 %
Neutro Abs: 4.5 10*3/uL (ref 1.5–6.5)
Neutrophils Relative %: 69 %
Platelet Count: 110 10*3/uL — ABNORMAL LOW (ref 140–400)
RBC: 5.18 MIL/uL (ref 4.20–5.82)
RDW: 14.8 % — ABNORMAL HIGH (ref 11.0–14.6)
WBC Count: 6.6 10*3/uL (ref 4.0–10.3)

## 2018-01-03 LAB — CMP (CANCER CENTER ONLY)
ALT: 55 U/L — ABNORMAL HIGH (ref 0–44)
AST: 33 U/L (ref 15–41)
Albumin: 4 g/dL (ref 3.5–5.0)
Alkaline Phosphatase: 54 U/L (ref 38–126)
Anion gap: 7 (ref 5–15)
BUN: 15 mg/dL (ref 8–23)
CO2: 28 mmol/L (ref 22–32)
Calcium: 9.5 mg/dL (ref 8.9–10.3)
Chloride: 106 mmol/L (ref 98–111)
Creatinine: 1.22 mg/dL (ref 0.61–1.24)
GFR, Est AFR Am: >60 mL/min (ref >60)
GFR, Estimated: 59 mL/min — ABNORMAL LOW (ref >60)
Glucose, Bld: 99 mg/dL (ref 70–99)
Potassium: 4.7 mmol/L (ref 3.5–5.1)
Sodium: 141 mmol/L (ref 135–145)
Total Bilirubin: 0.7 mg/dL (ref 0.3–1.2)
Total Protein: 6.8 g/dL (ref 6.5–8.1)

## 2018-01-03 MED ORDER — IOHEXOL 300 MG/ML  SOLN
100.0000 mL | Freq: Once | INTRAMUSCULAR | Status: AC | PRN
  Administered 2018-01-03: 100 mL via INTRAVENOUS

## 2018-01-03 MED FILL — TAGRISSO 80 MG TABLET: 80 | 30 days supply | Qty: 30 | Fill #2

## 2018-01-05 ENCOUNTER — Telehealth: Payer: Self-pay | Admitting: Internal Medicine

## 2018-01-05 ENCOUNTER — Encounter: Payer: Self-pay | Admitting: Internal Medicine

## 2018-01-05 ENCOUNTER — Inpatient Hospital Stay (HOSPITAL_BASED_OUTPATIENT_CLINIC_OR_DEPARTMENT_OTHER): Payer: Medicare Other | Admitting: Internal Medicine

## 2018-01-05 VITALS — BP 128/72 | HR 63 | Temp 98.8°F | Resp 18 | Ht 69.0 in | Wt 221.5 lb

## 2018-01-05 DIAGNOSIS — J90 Pleural effusion, not elsewhere classified: Secondary | ICD-10-CM

## 2018-01-05 DIAGNOSIS — E119 Type 2 diabetes mellitus without complications: Secondary | ICD-10-CM

## 2018-01-05 DIAGNOSIS — Z85118 Personal history of other malignant neoplasm of bronchus and lung: Secondary | ICD-10-CM | POA: Diagnosis not present

## 2018-01-05 DIAGNOSIS — I10 Essential (primary) hypertension: Secondary | ICD-10-CM | POA: Diagnosis not present

## 2018-01-05 DIAGNOSIS — C3491 Malignant neoplasm of unspecified part of right bronchus or lung: Secondary | ICD-10-CM

## 2018-01-05 DIAGNOSIS — Z5111 Encounter for antineoplastic chemotherapy: Secondary | ICD-10-CM

## 2018-01-05 NOTE — Telephone Encounter (Signed)
Appts scheduled AVS/Calendar printed per 8/28 los °

## 2018-01-05 NOTE — Progress Notes (Signed)
[place] Telephone:(336) [phone]   Fax:(336) [phone]  OFFICE PROGRESS NOTE  [name], MD [address] [facility] [address] [place] [address]  DIAGNOSIS: Recurrent non-small cell lung cancer, adenocarcinoma initially diagnosed as stage IA (T1c, N0, M0) non-small cell lung cancer, adenocarcinoma presented with right upper lobe lung nodule in April 2018 with recurrence in June 2019.  Biomarker Findings Microsatellite status - MS-Stable Tumor Mutational Burden - TMB-Low (3 Muts/Mb) Genomic Findings For a complete list of the genes assayed, please refer to the Appendix. EGFR exon 19 deletion ([id number]) CDKN2A/B loss NKX2-1 amplification 7 Disease relevant genes with no reportable alterations: KRAS, ALK, BRAF, MET, RET, ERBB2, ROS1   PRIOR THERAPY: Status post right upper lobectomy with lymph node dissection under the care of Dr. [name] on 09/02/2016.  CURRENT THERAPY: Tagrisso 80 mg p.o. daily.  First dose started November 05, 2017.  INTERVAL HISTORY: James [name] 67 y.o. male returns to the clinic today for follow-up visit accompanied by his wife.  The patient is feeling fine today with no concerning complaints.  He denied having any skin rash or diarrhea.  He has no chest pain, shortness of breath, cough or hemoptysis.  He has no weight loss or night sweats.  He denied having any nausea, vomiting, diarrhea or constipation.  He continues to tolerate his treatment with Tagrisso fairly well.  He had repeat CT scan of the chest, abdomen and pelvis performed recently and he is here for evaluation and discussion of his discuss results.   MEDICAL HISTORY: Past Medical History:  Diagnosis Date  . Adenocarcinoma of right lung, stage 1 ([place]) 09/06/2016  . Anxiety   . Arthritis    "knees, hips, back" (10/19/2012)  . Chronic diastolic congestive heart failure ([place])   . Chronic lower back pain   . Colon polyps    10/27/2002, repeat letter 09/17/2007  . Coronary  artery disease   . Coronary artery disease involving native coronary artery of native heart without angina pectoris   . Depressive disorder, not elsewhere classified    no meds  . Diabetes mellitus without complication ([place])    diet controlled- no med  (while in hosp 4/18 -elevated cbg  . Dyspnea   . Fasting hyperglycemia   . GERD (gastroesophageal reflux disease)   . Heart murmur   . Hemoptysis    abnormal CT Chest 01/29/10 - ? new GG changes RUL > not viz on plain cxr 02/26/2010  . Hypertension   . Mitral regurgitation    severe MR 08/2016  . MPN (myeloproliferative neoplasm) ([place])    1st detected 06/04/1998  . Obesity   . OSA on CPAP    last sleep study 10 years ago  . Other and unspecified hyperlipidemia   . Peripheral vascular disease ([place]) 08/2016   after lung surgey small clots in lungs,after hip dvt-5/16  . Pneumonia    4/18  . Positive PPD 1965   "non reactive in 2012" (10/19/2012)  . Routine general medical examination at a health care facility   . S/P CABG x 1 03/24/2017   LIMA to LAD  . S/P mitral valve repair 03/24/2017   Complex valvuloplasty including artificial Gore-tex neochord placement x6 and 28 mm Sorin Annuloflex posterior annuloplasty band  . Special screening for malignant neoplasm of prostate   . Spinal stenosis, unspecified region other than cervical   . Wrist pain, left     ALLERGIES:  is allergic to symbicort [budesonide-formoterol  fumarate] and amoxicillin.  MEDICATIONS:  Current Outpatient Medications  Medication Sig Dispense Refill  . acetaminophen (TYLENOL) 325 MG tablet Take 650 mg by mouth every 6 (six) hours as needed for moderate pain or headache.    Marland Kitchen aspirin EC 81 MG tablet Take 1 tablet (81 mg total) by mouth daily. 90 tablet 3  . atorvastatin (LIPITOR) 20 MG tablet TAKE 1 TABLET (20 MG TOTAL) BY MOUTH DAILY. 90 tablet 0  . furosemide (LASIX) 40 MG tablet TAKE 1 TABLET BY MOUTH EVERY DAY 90 tablet 3  . LORazepam (ATIVAN) 1 MG tablet One  tab 30 min before scan 2 tablet 0  . metoprolol tartrate (LOPRESSOR) 25 MG tablet TAKE 1/2 TABLET BY MOUTH 2 TIMES DAILY 30 tablet 11  . Omega-3 Fatty Acids (FISH OIL) 500 MG CAPS Take 500 mg by mouth daily.     Marland Kitchen osimertinib mesylate (TAGRISSO) 80 MG tablet Take 1 tablet (80 mg total) by mouth daily. 30 tablet 2  . potassium chloride SA (K-DUR,KLOR-CON) 20 MEQ tablet Take 1 tablet (20 mEq total) by mouth daily as needed. With lasix 90 tablet 0  . traMADol (ULTRAM) 50 MG tablet Take 1 tablet (50 mg total) by mouth every 6 (six) hours as needed (may take one or two tablets every six hrs prn). 30 tablet 0   No current facility-administered medications for this visit.     SURGICAL HISTORY:  Past Surgical History:  Procedure Laterality Date  . ANTERIOR CRUCIATE LIGAMENT REPAIR Left 1967  . CARDIAC CATHETERIZATION  2000  . CHEST TUBE INSERTION Right 10/19/2012   post bronch  . COLONOSCOPY W/ POLYPECTOMY    . CORONARY ARTERY BYPASS GRAFT N/A 03/24/2017   Procedure: CORONARY ARTERY BYPASS GRAFTING (CABG)x1 using left internal mammary artery, LIMA-LAD;  Surgeon: Rexene Alberts, MD;  Location: [facility];  Service: Open Heart Surgery;  Laterality: N/A;  . FLEXIBLE BRONCHOSCOPY  10/19/2012   Flexible video fiberoptic bronchoscopy with electromagnetic navigation and biopsies.  . INTRAVASCULAR PRESSURE WIRE/FFR STUDY N/A 08/11/2016   Procedure: Intravascular Pressure Wire/FFR Study;  Surgeon: [name], MD;  Location: [facility];  Service: Cardiovascular;  Laterality: N/A;  . LOBECTOMY Right 09/02/2016   Procedure: RIGHT UPPER LOBECTOMY;  Surgeon: Melrose Nakayama, MD;  Location: [facility];  Service: Thoracic;  Laterality: Right;  . LYMPH NODE DISSECTION Right 09/02/2016   Procedure: LYMPH NODE DISSECTION, RIGHT LUNG;  Surgeon: Melrose Nakayama, MD;  Location: [facility];  Service: Thoracic;  Laterality: Right;  . MITRAL VALVE REPAIR N/A 03/24/2017   Procedure: MITRAL VALVE REPAIR (MVR) with  Sorin [employer]omed[employer] Annuloflex size 28;  Surgeon: Rexene Alberts, MD;  Location: [facility];  Service: Open Heart Surgery;  Laterality: N/A;  . RIGHT/LEFT HEART CATH AND CORONARY ANGIOGRAPHY N/A 08/11/2016   Procedure: Right/Left Heart Cath and Coronary Angiography;  Surgeon: [name], MD;  Location: [facility];  Service: Cardiovascular;  Laterality: N/A;  . TEE WITHOUT CARDIOVERSION N/A 07/17/2016   Procedure: TRANSESOPHAGEAL ECHOCARDIOGRAM (TEE);  Surgeon: [name], MD;  Location: [facility];  Service: Cardiovascular;  Laterality: N/A;  . TEE WITHOUT CARDIOVERSION N/A 03/24/2017   Procedure: TRANSESOPHAGEAL ECHOCARDIOGRAM (TEE);  Surgeon: Rexene Alberts, MD;  Location: [facility];  Service: Open Heart Surgery;  Laterality: N/A;  . TONSILLECTOMY  1950's  . TOTAL HIP ARTHROPLASTY Left 10/05/2014   dr [name]  . TOTAL HIP ARTHROPLASTY Left 10/05/2014   Procedure: LEFT TOTAL HIP ARTHROPLASTY ANTERIOR APPROACH;  Surgeon: [name], MD;  Location: [place];  Service: Orthopedics;  Laterality: Left;  Marland Kitchen VIDEO ASSISTED THORACOSCOPY (VATS)/WEDGE RESECTION Right 09/02/2016   Procedure: VIDEO ASSISTED THORACOSCOPY (VATS)/RIGHT UPPER LOBE WEDGE RESECTION;  Surgeon: Melrose Nakayama, MD;  Location: [facility];  Service: Thoracic;  Laterality: Right;  Marland Kitchen VIDEO BRONCHOSCOPY WITH ENDOBRONCHIAL NAVIGATION N/A 10/19/2012   Procedure: VIDEO BRONCHOSCOPY WITH ENDOBRONCHIAL NAVIGATION;  Surgeon: [name], MD;  Location: [place];  Service: Thoracic;  Laterality: N/A;  . WRIST RECONSTRUCTION Left 12/2009   'proximal row carpectomy" [name]    REVIEW OF SYSTEMS:  Constitutional: negative Eyes: negative Ears, nose, mouth, throat, and face: negative Respiratory: negative Cardiovascular: negative Gastrointestinal: negative Genitourinary:negative Integument/breast: negative Hematologic/lymphatic: negative Musculoskeletal:negative Neurological: negative Behavioral/Psych: negative Endocrine:  negative Allergic/Immunologic: negative   PHYSICAL EXAMINATION: General appearance: alert, cooperative and no distress Head: Normocephalic, without obvious abnormality, atraumatic Neck: no adenopathy, no JVD, supple, symmetrical, trachea midline and thyroid not enlarged, symmetric, no tenderness/mass/nodules Lymph nodes: Cervical, supraclavicular, and axillary nodes normal. Resp: clear to auscultation bilaterally Back: symmetric, no curvature. ROM normal. No CVA tenderness. Cardio: regular rate and rhythm, S1, S2 normal, no murmur, click, rub or gallop GI: soft, non-tender; bowel sounds normal; no masses,  no organomegaly Extremities: extremities normal, atraumatic, no cyanosis or edema Neurologic: Alert and oriented X 3, normal strength and tone. Normal symmetric reflexes. Normal coordination and gait  ECOG PERFORMANCE STATUS: 1 - Symptomatic but completely ambulatory  Blood pressure 128/72, pulse 63, temperature 98.8 F (37.1 C), temperature source Oral, resp. rate 18, height '5\' 9"'$  (1.753 m), weight 221 lb 8 oz (100.5 kg), SpO2 95 %.  LABORATORY DATA: Lab Results  Component Value Date   WBC 6.6 01/03/2018   HGB 15.5 01/03/2018   HCT 46.6 01/03/2018   MCV 90.0 01/03/2018   PLT 110 (L) 01/03/2018      Chemistry      Component Value Date/Time   NA 141 01/03/2018 1319   NA 142 10/01/2016 1534   K 4.7 01/03/2018 1319   K 4.2 10/01/2016 1534   CL 106 01/03/2018 1319   CO2 28 01/03/2018 1319   CO2 28 10/01/2016 1534   BUN 15 01/03/2018 1319   BUN 15.9 10/01/2016 1534   CREATININE 1.22 01/03/2018 1319   CREATININE 1.10 02/08/2017 1101   CREATININE 1.5 (H) 10/01/2016 1534      Component Value Date/Time   CALCIUM 9.5 01/03/2018 1319   CALCIUM 9.6 10/01/2016 1534   ALKPHOS 54 01/03/2018 1319   ALKPHOS 65 10/01/2016 1534   AST 33 01/03/2018 1319   AST 20 10/01/2016 1534   ALT 55 (H) 01/03/2018 1319   ALT 24 10/01/2016 1534   BILITOT 0.7 01/03/2018 1319   BILITOT 0.46  10/01/2016 1534       RADIOGRAPHIC STUDIES: Ct Chest W Contrast  Result Date: 01/04/2018 CLINICAL DATA:  Non-small cell lung cancer diagnosed in April 2018. Prior right upper lobectomy. Chronic shortness of breath. Restaging assessment. EXAM: CT CHEST, ABDOMEN, AND PELVIS WITH CONTRAST TECHNIQUE: Multidetector CT imaging of the chest, abdomen and pelvis was performed following the standard protocol during bolus administration of intravenous contrast. CONTRAST:  177m OMNIPAQUE IOHEXOL 300 MG/ML  SOLN COMPARISON:  Multiple exams, including 11/22/2017 and PET-CT from 10/25/2017 FINDINGS: CT CHEST FINDINGS Cardiovascular: Atherosclerotic calcification of the aortic arch and branch vessels. Prior CABG and mitral valve repair. Mediastinum/Nodes: There is no pathologic adenopathy. A lymph node just below the right mainstem bronchus measures 0.6 cm in short axis,  formerly 0.8 cm. Lungs/Pleura: Stable 5 by 6 mm right lower lobe nodule on image 66/7. The suspected rounded atelectasis posteriorly in the right lower lobe is less prominent today than before. There continues to be branching and reticulonodular opacity posteromedially in the left upper lobe for example on image 44/7 suspicious for potential bronchocele and atypical infectious bronchiolitis. Scarring and volume loss peripherally in the left lower lobe with punctate associated calcification, slightly more prominent than on 11/22/2017, and meriting observation in this patient. Old granulomatous disease noted. Mild airway thickening. Mild reduction in the size of the right pleural effusion. Musculoskeletal: Prior median sternotomy.  Thoracic spondylosis. CT ABDOMEN PELVIS FINDINGS Hepatobiliary: Stable hypodense hepatic lesions. The 2 larger lesions are fluid density, the smaller lesion anteriorly in the right hepatic lobe on image 46/2 is nonspecific due to small size. Dependent density in the gallbladder favoring sludge or gallstones. There is no current CT  correlate for the small focus of metabolic activity centrally in the right hepatic lobe shown on the prior exam, this area may merit surveillance. Pancreas: Unremarkable Spleen: Unremarkable Adrenals/Urinary Tract: Adrenal glands unremarkable. Left peripelvic cysts. Stomach/Bowel: Unremarkable Vascular/Lymphatic: Aortoiliac atherosclerotic vascular disease. Porta hepatis node 0.9 cm in short axis on image 60/2, formerly the same. Reproductive: Notable prostatomegaly. Other: No supplemental non-categorized findings. Musculoskeletal: Bridging spurring of the sacroiliac joints. Left hip implant. Lumbar spondylosis and degenerative disc disease causing mild multilevel impingement. Grade 1 degenerative retrolisthesis at L2-3. IMPRESSION: 1. No findings of progressive malignancy. 2. Mild reduction in the volume of the right pleural effusion. 3. The faintly hypermetabolic lymph node below the right mainstem bronchus shown on the prior exam is 0.6 cm in short axis, formerly 0.8 cm. 4. Reduced conspicuity of the area of prior rounded atelectasis in the right lower lobe. However, there is some mildly progressive volume loss and scarring peripherally in the left lower lobe which merits surveillance. 5. Aortic Atherosclerosis (ICD10-I70.0). Prior CABG and mitral valve repair. 6. Stable chronic mucus plugging and possible bronchocele in the left upper lobe. 7. Stable hepatic and left renal cysts. 8. Prostatomegaly. Electronically Signed   By: Van Clines M.D.   On: 01/04/2018 08:57   Ct Abdomen Pelvis W Contrast  Result Date: 01/04/2018 CLINICAL DATA:  Non-small cell lung cancer diagnosed in April 2018. Prior right upper lobectomy. Chronic shortness of breath. Restaging assessment. EXAM: CT CHEST, ABDOMEN, AND PELVIS WITH CONTRAST TECHNIQUE: Multidetector CT imaging of the chest, abdomen and pelvis was performed following the standard protocol during bolus administration of intravenous contrast. CONTRAST:  164m  OMNIPAQUE IOHEXOL 300 MG/ML  SOLN COMPARISON:  Multiple exams, including 11/22/2017 and PET-CT from 10/25/2017 FINDINGS: CT CHEST FINDINGS Cardiovascular: Atherosclerotic calcification of the aortic arch and branch vessels. Prior CABG and mitral valve repair. Mediastinum/Nodes: There is no pathologic adenopathy. A lymph node just below the right mainstem bronchus measures 0.6 cm in short axis, formerly 0.8 cm. Lungs/Pleura: Stable 5 by 6 mm right lower lobe nodule on image 66/7. The suspected rounded atelectasis posteriorly in the right lower lobe is less prominent today than before. There continues to be branching and reticulonodular opacity posteromedially in the left upper lobe for example on image 44/7 suspicious for potential bronchocele and atypical infectious bronchiolitis. Scarring and volume loss peripherally in the left lower lobe with punctate associated calcification, slightly more prominent than on 11/22/2017, and meriting observation in this patient. Old granulomatous disease noted. Mild airway thickening. Mild reduction in the size of the right pleural effusion. Musculoskeletal: Prior median  sternotomy.  Thoracic spondylosis. CT ABDOMEN PELVIS FINDINGS Hepatobiliary: Stable hypodense hepatic lesions. The 2 larger lesions are fluid density, the smaller lesion anteriorly in the right hepatic lobe on image 46/2 is nonspecific due to small size. Dependent density in the gallbladder favoring sludge or gallstones. There is no current CT correlate for the small focus of metabolic activity centrally in the right hepatic lobe shown on the prior exam, this area may merit surveillance. Pancreas: Unremarkable Spleen: Unremarkable Adrenals/Urinary Tract: Adrenal glands unremarkable. Left peripelvic cysts. Stomach/Bowel: Unremarkable Vascular/Lymphatic: Aortoiliac atherosclerotic vascular disease. Porta hepatis node 0.9 cm in short axis on image 60/2, formerly the same. Reproductive: Notable prostatomegaly. Other:  No supplemental non-categorized findings. Musculoskeletal: Bridging spurring of the sacroiliac joints. Left hip implant. Lumbar spondylosis and degenerative disc disease causing mild multilevel impingement. Grade 1 degenerative retrolisthesis at L2-3. IMPRESSION: 1. No findings of progressive malignancy. 2. Mild reduction in the volume of the right pleural effusion. 3. The faintly hypermetabolic lymph node below the right mainstem bronchus shown on the prior exam is 0.6 cm in short axis, formerly 0.8 cm. 4. Reduced conspicuity of the area of prior rounded atelectasis in the right lower lobe. However, there is some mildly progressive volume loss and scarring peripherally in the left lower lobe which merits surveillance. 5. Aortic Atherosclerosis (ICD10-I70.0). Prior CABG and mitral valve repair. 6. Stable chronic mucus plugging and possible bronchocele in the left upper lobe. 7. Stable hepatic and left renal cysts. 8. Prostatomegaly. Electronically Signed   By: Van Clines M.D.   On: 01/04/2018 08:57    ASSESSMENT AND PLAN: This is a very pleasant 67 years old white male with a stage Ia non-small cell lung cancer status post right upper lobectomy with lymph node dissection on September 02, 2016. The patient has been in observation since that time. Unfortunately there are some concerning findings with nodular density in the posterior right lower lobe as well as increase and right pleural effusion suspicious for disease recurrence. He recently underwent ultrasound-guided right thoracentesis and the cytology of the pleural fluid was consistent with recurrent adenocarcinoma. He had molecular studies performed by foundation 1 that showed positive EGFR mutation with deletion 47. The patient was started on treatment with Tagrisso 80 mg p.o. daily on 11/05/2017.  He status post 2 months of treatment. He continues to tolerate this treatment well with no concerning complaints. The patient had repeat CT scan of the  chest, abdomen and pelvis performed recently.  I personally and independently reviewed the scan images and discussed the result and showed the images to the patient and his wife.  His a scan showed no concerning findings for disease progression and there was some improvement of his disease. I recommended for the patient to continue his current treatment with Tagrisso with the same dose. I will see him back for follow-up visit in 1 month for evaluation with repeat blood work. He was advised to call immediately if he has any concerning symptoms in the interval. The patient voices understanding of current disease status and treatment options and is in agreement with the current care plan. I spent 15 minutes counseling the patient face to face. The total time spent in the appointment was 25 minutes.  All questions were answered. The patient knows to call the clinic with any problems, questions or concerns. We can certainly see the patient much sooner if necessary.  Disclaimer: This note was dictated with voice recognition software. Similar sounding words can inadvertently be transcribed and may not  be corrected upon review.

## 2018-01-18 ENCOUNTER — Telehealth: Payer: Self-pay | Admitting: Pharmacist

## 2018-01-18 NOTE — Telephone Encounter (Signed)
Oral Chemotherapy Pharmacist Encounter  Follow-Up Form  Spoke with patient today to follow up regarding patient's oral chemotherapy medication:  Tagrisso (osimertinib) for the treatment of advanced non small cell lung cancer, EGFR mutation positive (exon 19 deletion), planned duration until disease progression or unacceptable toxicity.  Original Start date of oral chemotherapy: 11/05/17  Pt is doing well today  Pt reports 0 tablets/doses of Tagrisso 80 tablets, 1 tablet by mouth once daily, without regard to food, missed in the last month.   Pt reports the following side effects: Patient states he is experiencing intermittent diarrhea since a course of doxycycline back in August.  He was not experiencing any changes in bowels before the antibiotic course, did have some GI upset while on the antibiotic, and continues to have intermittent watery stools since completion of the antibiotic course.  Patient states he is using daily yogurt and any watery or loose stool symptoms that he is experiencing are manageable.  He has not used any Imodium yet.  He will alert the office if the symptoms do not subside or if they worsen in any way. Patient also with questions about whether or not he can take a muscle relaxer with his Tagrisso as he is experiencing some lower back pain from working in his garden.  No drug interactions noted.  Safe to take muscle relaxer.  Patient very grateful for this information.  Pertinent labs reviewed: Okay for continued treatment, we discussed slightly decreased platelet count.  Also possible to be due to antibiotic course, will be rechecked at next office visit.  Confirmed office visit on 02/08/2018 with patient. Patient very appreciative of call today.  Patient knows to call the office with questions or concerns. Oral Oncology Clinic will continue to follow.  Johny Drilling, PharmD, BCPS, BCOP  01/18/2018 3:55 PM Oral Oncology Clinic [phone]

## 2018-01-19 ENCOUNTER — Other Ambulatory Visit: Payer: Self-pay | Admitting: Internal Medicine

## 2018-01-19 DIAGNOSIS — C3491 Malignant neoplasm of unspecified part of right bronchus or lung: Secondary | ICD-10-CM

## 2018-01-27 MED FILL — TAGRISSO 80 MG TABLET: 80 | 30 days supply | Qty: 30 | Fill #0

## 2018-02-06 ENCOUNTER — Inpatient Hospital Stay (HOSPITAL_COMMUNITY): Payer: Medicare Other

## 2018-02-06 ENCOUNTER — Emergency Department (HOSPITAL_COMMUNITY): Payer: Medicare Other

## 2018-02-06 ENCOUNTER — Inpatient Hospital Stay (HOSPITAL_COMMUNITY)
Admission: EM | Admit: 2018-02-06 | Discharge: 2018-02-13 | DRG: 175 | Disposition: A | Discharge status: Home / Self Care | Payer: Medicare Other | Attending: Internal Medicine | Admitting: Internal Medicine

## 2018-02-06 ENCOUNTER — Encounter (HOSPITAL_COMMUNITY): Payer: Self-pay | Admitting: Emergency Medicine

## 2018-02-06 DIAGNOSIS — Z952 Presence of prosthetic heart valve: Secondary | ICD-10-CM

## 2018-02-06 DIAGNOSIS — Z86010 Personal history of colonic polyps: Secondary | ICD-10-CM

## 2018-02-06 DIAGNOSIS — Z951 Presence of aortocoronary bypass graft: Secondary | ICD-10-CM

## 2018-02-06 DIAGNOSIS — R109 Unspecified abdominal pain: Secondary | ICD-10-CM

## 2018-02-06 DIAGNOSIS — E1122 Type 2 diabetes mellitus with diabetic chronic kidney disease: Secondary | ICD-10-CM | POA: Diagnosis present

## 2018-02-06 DIAGNOSIS — M4800 Spinal stenosis, site unspecified: Secondary | ICD-10-CM | POA: Diagnosis present

## 2018-02-06 DIAGNOSIS — D696 Thrombocytopenia, unspecified: Secondary | ICD-10-CM | POA: Diagnosis present

## 2018-02-06 DIAGNOSIS — N179 Acute kidney failure, unspecified: Secondary | ICD-10-CM | POA: Diagnosis not present

## 2018-02-06 DIAGNOSIS — N183 Chronic kidney disease, stage 3 (moderate): Secondary | ICD-10-CM | POA: Diagnosis present

## 2018-02-06 DIAGNOSIS — G4733 Obstructive sleep apnea (adult) (pediatric): Secondary | ICD-10-CM | POA: Diagnosis present

## 2018-02-06 DIAGNOSIS — C3491 Malignant neoplasm of unspecified part of right bronchus or lung: Secondary | ICD-10-CM | POA: Diagnosis present

## 2018-02-06 DIAGNOSIS — R0600 Dyspnea, unspecified: Secondary | ICD-10-CM | POA: Diagnosis not present

## 2018-02-06 DIAGNOSIS — J45901 Unspecified asthma with (acute) exacerbation: Secondary | ICD-10-CM | POA: Diagnosis present

## 2018-02-06 DIAGNOSIS — Z6833 Body mass index (BMI) 33.0-33.9, adult: Secondary | ICD-10-CM

## 2018-02-06 DIAGNOSIS — E1151 Type 2 diabetes mellitus with diabetic peripheral angiopathy without gangrene: Secondary | ICD-10-CM | POA: Diagnosis present

## 2018-02-06 DIAGNOSIS — J9601 Acute respiratory failure with hypoxia: Secondary | ICD-10-CM | POA: Diagnosis present

## 2018-02-06 DIAGNOSIS — R011 Cardiac murmur, unspecified: Secondary | ICD-10-CM | POA: Diagnosis present

## 2018-02-06 DIAGNOSIS — D62 Acute posthemorrhagic anemia: Secondary | ICD-10-CM | POA: Diagnosis not present

## 2018-02-06 DIAGNOSIS — I82551 Chronic embolism and thrombosis of right peroneal vein: Secondary | ICD-10-CM | POA: Diagnosis present

## 2018-02-06 DIAGNOSIS — R310 Gross hematuria: Secondary | ICD-10-CM | POA: Diagnosis not present

## 2018-02-06 DIAGNOSIS — N281 Cyst of kidney, acquired: Secondary | ICD-10-CM | POA: Diagnosis present

## 2018-02-06 DIAGNOSIS — Z9989 Dependence on other enabling machines and devices: Secondary | ICD-10-CM

## 2018-02-06 DIAGNOSIS — F329 Major depressive disorder, single episode, unspecified: Secondary | ICD-10-CM | POA: Diagnosis present

## 2018-02-06 DIAGNOSIS — K5900 Constipation, unspecified: Secondary | ICD-10-CM | POA: Diagnosis present

## 2018-02-06 DIAGNOSIS — R0602 Shortness of breath: Secondary | ICD-10-CM | POA: Diagnosis present

## 2018-02-06 DIAGNOSIS — M159 Polyosteoarthritis, unspecified: Secondary | ICD-10-CM | POA: Diagnosis present

## 2018-02-06 DIAGNOSIS — I2510 Atherosclerotic heart disease of native coronary artery without angina pectoris: Secondary | ICD-10-CM | POA: Diagnosis present

## 2018-02-06 DIAGNOSIS — K219 Gastro-esophageal reflux disease without esophagitis: Secondary | ICD-10-CM | POA: Diagnosis present

## 2018-02-06 DIAGNOSIS — M545 Low back pain: Secondary | ICD-10-CM | POA: Diagnosis present

## 2018-02-06 DIAGNOSIS — Z96642 Presence of left artificial hip joint: Secondary | ICD-10-CM | POA: Diagnosis present

## 2018-02-06 DIAGNOSIS — R0603 Acute respiratory distress: Secondary | ICD-10-CM | POA: Diagnosis not present

## 2018-02-06 DIAGNOSIS — I82409 Acute embolism and thrombosis of unspecified deep veins of unspecified lower extremity: Secondary | ICD-10-CM | POA: Diagnosis not present

## 2018-02-06 DIAGNOSIS — E669 Obesity, unspecified: Secondary | ICD-10-CM | POA: Diagnosis present

## 2018-02-06 DIAGNOSIS — F419 Anxiety disorder, unspecified: Secondary | ICD-10-CM | POA: Diagnosis present

## 2018-02-06 DIAGNOSIS — E785 Hyperlipidemia, unspecified: Secondary | ICD-10-CM | POA: Diagnosis present

## 2018-02-06 DIAGNOSIS — Z85118 Personal history of other malignant neoplasm of bronchus and lung: Secondary | ICD-10-CM

## 2018-02-06 DIAGNOSIS — G8929 Other chronic pain: Secondary | ICD-10-CM | POA: Diagnosis present

## 2018-02-06 DIAGNOSIS — R319 Hematuria, unspecified: Secondary | ICD-10-CM | POA: Diagnosis not present

## 2018-02-06 DIAGNOSIS — I5032 Chronic diastolic (congestive) heart failure: Secondary | ICD-10-CM | POA: Diagnosis present

## 2018-02-06 DIAGNOSIS — Z8249 Family history of ischemic heart disease and other diseases of the circulatory system: Secondary | ICD-10-CM

## 2018-02-06 DIAGNOSIS — S8011XS Contusion of right lower leg, sequela: Secondary | ICD-10-CM | POA: Diagnosis not present

## 2018-02-06 DIAGNOSIS — Z888 Allergy status to other drugs, medicaments and biological substances status: Secondary | ICD-10-CM

## 2018-02-06 DIAGNOSIS — Z91048 Other nonmedicinal substance allergy status: Secondary | ICD-10-CM

## 2018-02-06 DIAGNOSIS — M79609 Pain in unspecified limb: Secondary | ICD-10-CM | POA: Diagnosis not present

## 2018-02-06 DIAGNOSIS — I2699 Other pulmonary embolism without acute cor pulmonale: Principal | ICD-10-CM | POA: Diagnosis present

## 2018-02-06 DIAGNOSIS — Z8719 Personal history of other diseases of the digestive system: Secondary | ICD-10-CM

## 2018-02-06 DIAGNOSIS — J811 Chronic pulmonary edema: Secondary | ICD-10-CM

## 2018-02-06 DIAGNOSIS — Q638 Other specified congenital malformations of kidney: Secondary | ICD-10-CM

## 2018-02-06 DIAGNOSIS — Z881 Allergy status to other antibiotic agents status: Secondary | ICD-10-CM

## 2018-02-06 DIAGNOSIS — I130 Hypertensive heart and chronic kidney disease with heart failure and stage 1 through stage 4 chronic kidney disease, or unspecified chronic kidney disease: Secondary | ICD-10-CM | POA: Diagnosis present

## 2018-02-06 DIAGNOSIS — M79604 Pain in right leg: Secondary | ICD-10-CM | POA: Diagnosis not present

## 2018-02-06 DIAGNOSIS — Z7982 Long term (current) use of aspirin: Secondary | ICD-10-CM

## 2018-02-06 DIAGNOSIS — N189 Chronic kidney disease, unspecified: Secondary | ICD-10-CM | POA: Diagnosis not present

## 2018-02-06 HISTORY — PX: IR ANGIOGRAM PULMONARY BILATERAL SELECTIVE: IMG664

## 2018-02-06 HISTORY — PX: IR ANGIOGRAM SELECTIVE EACH ADDITIONAL VESSEL: IMG667

## 2018-02-06 HISTORY — PX: IR INFUSION THROMBOL ARTERIAL INITIAL (MS): IMG5376

## 2018-02-06 HISTORY — PX: IR US GUIDE VASC ACCESS RIGHT: IMG2390

## 2018-02-06 LAB — CBC WITH DIFFERENTIAL/PLATELET
Abs Immature Granulocytes: 0 10*3/uL (ref 0.0–0.1)
Basophils Absolute: 0 10*3/uL (ref 0.0–0.1)
Basophils Relative: 0 %
Eosinophils Absolute: 0.1 10*3/uL (ref 0.0–0.7)
Eosinophils Relative: 1 %
HCT: 52.1 % — ABNORMAL HIGH (ref 39.0–52.0)
Hemoglobin: 16.9 g/dL (ref 13.0–17.0)
Immature Granulocytes: 0 %
Lymphocytes Relative: 8 %
Lymphs Abs: 0.8 10*3/uL (ref 0.7–4.0)
MCH: 29.6 pg (ref 26.0–34.0)
MCHC: 32.4 g/dL (ref 30.0–36.0)
MCV: 91.4 fL (ref 78.0–100.0)
Monocytes Absolute: 0.8 10*3/uL (ref 0.1–1.0)
Monocytes Relative: 8 %
Neutro Abs: 8.4 10*3/uL — ABNORMAL HIGH (ref 1.7–7.7)
Neutrophils Relative %: 83 %
Platelets: 98 10*3/uL — ABNORMAL LOW (ref 150–400)
RBC: 5.7 MIL/uL (ref 4.22–5.81)
RDW: 14.5 % (ref 11.5–15.5)
WBC: 10.1 10*3/uL (ref 4.0–10.5)

## 2018-02-06 LAB — COMPREHENSIVE METABOLIC PANEL
ALT: 40 U/L (ref 0–44)
AST: 37 U/L (ref 15–41)
Albumin: 4 g/dL (ref 3.5–5.0)
Alkaline Phosphatase: 55 U/L (ref 38–126)
Anion gap: 11 (ref 5–15)
BUN: 16 mg/dL (ref 8–23)
CO2: 19 mmol/L — ABNORMAL LOW (ref 22–32)
Calcium: 9 mg/dL (ref 8.9–10.3)
Chloride: 107 mmol/L (ref 98–111)
Creatinine, Ser: 1.15 mg/dL (ref 0.61–1.24)
GFR calc Af Amer: >60 mL/min (ref >60)
GFR calc non Af Amer: >60 mL/min (ref >60)
Glucose, Bld: 189 mg/dL — ABNORMAL HIGH (ref 70–99)
Potassium: 4.4 mmol/L (ref 3.5–5.1)
Sodium: 137 mmol/L (ref 135–145)
Total Bilirubin: 1.2 mg/dL (ref 0.3–1.2)
Total Protein: 6.7 g/dL (ref 6.5–8.1)

## 2018-02-06 LAB — PROTIME-INR
INR: 1.15
INR: 1.19
Prothrombin Time: 14.6 seconds (ref 11.4–15.2)
Prothrombin Time: 15 seconds (ref 11.4–15.2)

## 2018-02-06 LAB — HEPARIN LEVEL (UNFRACTIONATED)
Heparin Unfractionated: <0.1 IU/mL — ABNORMAL LOW (ref 0.30–0.70)
Heparin Unfractionated: 0.87 IU/mL — ABNORMAL HIGH (ref 0.30–0.70)

## 2018-02-06 LAB — CBC
HCT: 48.5 % (ref 39.0–52.0)
Hemoglobin: 15.8 g/dL (ref 13.0–17.0)
MCH: 29.6 pg (ref 26.0–34.0)
MCHC: 32.6 g/dL (ref 30.0–36.0)
MCV: 90.8 fL (ref 78.0–100.0)
Platelets: 105 10*3/uL — ABNORMAL LOW (ref 150–400)
RBC: 5.34 MIL/uL (ref 4.22–5.81)
RDW: 14.4 % (ref 11.5–15.5)
WBC: 10.1 10*3/uL (ref 4.0–10.5)

## 2018-02-06 LAB — BRAIN NATRIURETIC PEPTIDE: B Natriuretic Peptide: 120.7 pg/mL — ABNORMAL HIGH (ref 0.0–100.0)

## 2018-02-06 LAB — MRSA PCR SCREENING: MRSA by PCR: NEGATIVE

## 2018-02-06 LAB — FIBRINOGEN: Fibrinogen: 296 mg/dL (ref 210–475)

## 2018-02-06 LAB — TROPONIN I: Troponin I: 0.52 ng/mL (ref <0.03)

## 2018-02-06 MED ORDER — ASPIRIN 300 MG RE SUPP: 300.0000 mg | RECTAL | Status: AC

## 2018-02-06 MED ORDER — IOPAMIDOL (ISOVUE-300) INJECTION 61%
INTRAVENOUS | Status: AC
  Filled 2018-02-06: qty 100

## 2018-02-06 MED ORDER — SODIUM CHLORIDE 0.9 % IV SOLN: INTRAVENOUS | Status: DC

## 2018-02-06 MED ORDER — HEPARIN BOLUS VIA INFUSION
5500.0000 [IU] | Freq: Once | INTRAVENOUS | Status: AC
  Administered 2018-02-06: 5500 [IU] via INTRAVENOUS
  Filled 2018-02-06: qty 5500

## 2018-02-06 MED ORDER — MIDAZOLAM HCL 2 MG/2ML IJ SOLN
INTRAMUSCULAR | Status: AC | PRN
  Administered 2018-02-06: 0.5 mg via INTRAVENOUS
  Administered 2018-02-06 (×2): 1 mg via INTRAVENOUS

## 2018-02-06 MED ORDER — SODIUM CHLORIDE 0.9% FLUSH
3.0000 mL | Freq: Two times a day (BID) | INTRAVENOUS | Status: DC
  Administered 2018-02-07 – 2018-02-08 (×4): 3 mL via INTRAVENOUS

## 2018-02-06 MED ORDER — HEPARIN (PORCINE) IN NACL 100-0.45 UNIT/ML-% IJ SOLN: 16.0000 [IU]/kg/h | INTRAMUSCULAR | Status: DC

## 2018-02-06 MED ORDER — LIDOCAINE HCL 1 % IJ SOLN
INTRAMUSCULAR | Status: AC
  Filled 2018-02-06: qty 20

## 2018-02-06 MED ORDER — SODIUM CHLORIDE 0.9 % IV SOLN
INTRAVENOUS | Status: DC
  Administered 2018-02-06 – 2018-02-07 (×2): via INTRAVENOUS

## 2018-02-06 MED ORDER — FENTANYL CITRATE (PF) 100 MCG/2ML IJ SOLN
INTRAMUSCULAR | Status: AC
  Filled 2018-02-06: qty 4

## 2018-02-06 MED ORDER — SODIUM CHLORIDE 0.9% FLUSH: 3.0000 mL | INTRAVENOUS | Status: DC | PRN

## 2018-02-06 MED ORDER — SODIUM CHLORIDE 0.9 % IV SOLN
12.0000 mg | Freq: Once | INTRAVENOUS | Status: AC
  Administered 2018-02-06: 12 mg via INTRAVENOUS
  Filled 2018-02-06: qty 12

## 2018-02-06 MED ORDER — FENTANYL CITRATE (PF) 100 MCG/2ML IJ SOLN
INTRAMUSCULAR | Status: AC | PRN
  Administered 2018-02-06: 25 ug via INTRAVENOUS
  Administered 2018-02-06: 50 ug via INTRAVENOUS
  Administered 2018-02-06: 25 ug via INTRAVENOUS

## 2018-02-06 MED ORDER — ASPIRIN 81 MG PO CHEW
324.0000 mg | CHEWABLE_TABLET | ORAL | Status: AC
  Administered 2018-02-06: 324 mg via ORAL
  Filled 2018-02-06: qty 4

## 2018-02-06 MED ORDER — IOPAMIDOL (ISOVUE-300) INJECTION 61%: 25.0000 mL | Freq: Once | INTRAVENOUS | Status: DC | PRN

## 2018-02-06 MED ORDER — HEPARIN (PORCINE) IN NACL 100-0.45 UNIT/ML-% IJ SOLN
1400.0000 [IU]/h | INTRAMUSCULAR | Status: AC
  Administered 2018-02-06: 1550 [IU]/h via INTRAVENOUS
  Administered 2018-02-07: 1450 [IU]/h via INTRAVENOUS
  Administered 2018-02-07: 1850 [IU]/h via INTRAVENOUS
  Administered 2018-02-08: 1450 [IU]/h via INTRAVENOUS
  Administered 2018-02-09 – 2018-02-12 (×5): 1400 [IU]/h via INTRAVENOUS
  Filled 2018-02-06 (×9): qty 250

## 2018-02-06 MED ORDER — LIDOCAINE HCL (PF) 1 % IJ SOLN
INTRAMUSCULAR | Status: AC | PRN
  Administered 2018-02-06: 8 mL

## 2018-02-06 MED ORDER — MIDAZOLAM HCL 2 MG/2ML IJ SOLN
INTRAMUSCULAR | Status: AC
  Filled 2018-02-06: qty 4

## 2018-02-06 MED ORDER — SODIUM CHLORIDE 0.9 % IV SOLN: 250.0000 mL | INTRAVENOUS | Status: DC | PRN

## 2018-02-06 MED ORDER — IOPAMIDOL (ISOVUE-370) INJECTION 76%
INTRAVENOUS | Status: AC
  Administered 2018-02-06: 100 mL
  Filled 2018-02-06: qty 100

## 2018-02-06 MED ORDER — LOPERAMIDE HCL 2 MG PO CAPS
2.0000 mg | ORAL_CAPSULE | ORAL | Status: DC | PRN
  Administered 2018-02-06: 2 mg via ORAL
  Filled 2018-02-06 (×3): qty 1

## 2018-02-06 MED ORDER — SODIUM CHLORIDE 0.9 % IV SOLN
INTRAVENOUS | Status: DC
  Administered 2018-02-06 – 2018-02-07 (×2): via INTRAVENOUS

## 2018-02-06 NOTE — H&P (Signed)
Name: James [name] MRN: [id number] DOB: 09-Dec-1950    ADMISSION DATE:  02/06/2018 CONSULTATION DATE: February 06, 2018  REFERRING MD : ED staff  CHIEF COMPLAINT: Sudden shortness of breath  BRIEF PATIENT DESCRIPTION: Stage IV lung cancer  SIGNIFICANT EVENTS  Massive PE  STUDIES:  High clot burden   HISTORY OF PRESENT ILLNESS: This is a 67 year old very unfortunate man with left history of smoking stage IV lung cancer adenocarcinoma on biological therapy who is here with a sudden shortness of breath CT scan showed a high clot burden with RV to LV the ratio is 1.7 patient is complaining of sudden shortness of breath severe shortness of breath feeling of respiratory distress he also has history of obstructive sleep apnea and CPAP.  He is denying any major events recently except that the lung cancer he is denied any history of smoking in the past he is always maintained a healthy life and he was a Surveyor, mining.  PAST MEDICAL HISTORY :   has a past medical history of Adenocarcinoma of right lung, stage 1 ([place]) (09/06/2016), Anxiety, Arthritis, Chronic diastolic congestive heart failure ([place]), Chronic lower back pain, Colon polyps, Coronary artery disease, Coronary artery disease involving native coronary artery of native heart without angina pectoris, Depressive disorder, not elsewhere classified, Diabetes mellitus without complication ([place]), Dyspnea, Fasting hyperglycemia, GERD (gastroesophageal reflux disease), Heart murmur, Hemoptysis, Hypertension, Mitral regurgitation, MPN (myeloproliferative neoplasm) ([place]), Obesity, OSA on CPAP, Other and unspecified hyperlipidemia, Peripheral vascular disease ([place]) (08/2016), Pneumonia, Positive PPD (1965), Routine general medical examination at a health care facility, S/P CABG x 1 (03/24/2017), S/P mitral valve repair (03/24/2017), Special screening for malignant neoplasm of prostate, Spinal stenosis, unspecified region other than cervical,  and Wrist pain, left.  has a past surgical history that includes Tonsillectomy (1950's); Anterior cruciate ligament repair (Left, 1967); Wrist reconstruction (Left, 12/2009); Cardiac catheterization (2000); Colonoscopy w/ polypectomy; Flexible bronchoscopy (10/19/2012); Chest tube insertion (Right, 10/19/2012); Video bronchoscopy with endobronchial navigation (N/A, 10/19/2012); Total hip arthroplasty (Left, 10/05/2014); Total hip arthroplasty (Left, 10/05/2014); TEE without cardioversion (N/A, 07/17/2016); RIGHT/LEFT HEART CATH AND CORONARY ANGIOGRAPHY (N/A, 08/11/2016); INTRAVASCULAR PRESSURE WIRE/FFR STUDY (N/A, 08/11/2016); Video assisted thoracoscopy (vats)/wedge resection (Right, 09/02/2016); Lobectomy (Right, 09/02/2016); Lymph node dissection (Right, 09/02/2016); Mitral valve repair (N/A, 03/24/2017); Coronary artery bypass graft (N/A, 03/24/2017); and TEE without cardioversion (N/A, 03/24/2017). Prior to Admission medications   Medication Sig Start Date End Date Taking? Authorizing Provider  acetaminophen (TYLENOL) 325 MG tablet Take 650 mg by mouth every 6 (six) hours as needed for moderate pain or headache.   Yes [provider]  aspirin EC 81 MG tablet Take 1 tablet (81 mg total) by mouth daily. 07/27/16  Yes [name], MD  atorvastatin (LIPITOR) 20 MG tablet TAKE 1 TABLET (20 MG TOTAL) BY MOUTH DAILY. 12/12/17  Yes [name], MD  furosemide (LASIX) 40 MG tablet TAKE 1 TABLET BY MOUTH EVERY DAY Patient taking differently: Take 40 mg by mouth as needed for fluid.  11/03/17  Yes [place], Peter M, MD  metoprolol tartrate (LOPRESSOR) 25 MG tablet TAKE 1/2 TABLET BY MOUTH 2 TIMES DAILY Patient taking differently: Take 12.5 mg by mouth 2 (two) times daily.  06/29/17  Yes [place], Peter M, MD  Omega-3 Fatty Acids (FISH OIL) 500 MG CAPS Take 500 mg by mouth daily.    Yes [provider]  potassium chloride SA (K-DUR,KLOR-CON) 20 MEQ tablet Take 1 tablet (20 mEq total) by mouth daily as  needed. With  lasix 04/14/17  Yes Almyra Deforest, PA  sodium chloride (OCEAN) 0.65 % SOLN nasal spray Place 1 spray into both nostrils as needed for congestion.   Yes [provider]  TAGRISSO 80 MG tablet TAKE 1 TABLET (80 MG TOTAL) BY MOUTH DAILY. Patient taking differently: Take 80 mg by mouth daily.  01/19/18  Yes Curt Bears, MD  traMADol (ULTRAM) 50 MG tablet Take 1 tablet (50 mg total) by mouth every 6 (six) hours as needed (may take one or two tablets every six hrs prn). 04/16/17  Yes Rexene Alberts, MD  LORazepam (ATIVAN) 1 MG tablet One tab 30 min before scan Patient not taking: Reported on 02/06/2018 10/20/17   Curt Bears, MD   Allergies  Allergen Reactions  . Symbicort [Budesonide-Formoterol Fumarate] Other (See Comments) and Cough    Chest pain, wheezing   . Amoxicillin Itching and Other (See Comments)    Has patient had a PCN reaction causing immediate rash, facial/tongue/throat swelling, SOB or lightheadedness with hypotension: No Has patient had a PCN reaction causing severe rash involving mucus membranes or skin necrosis: No Has patient had a PCN reaction that required hospitalization No Has patient had a PCN reaction occurring within the last 10 years: No If all of the above answers are "NO", then may proceed with Cephalosporin use.     FAMILY HISTORY:  family history includes Heart attack in his father; Heart disease in his father and paternal uncle; Heart failure in his mother. SOCIAL HISTORY:  reports that he has never smoked. He has never used smokeless tobacco. He reports that he drinks about 1.0 standard drinks of alcohol per week. He reports that he does not use drugs.  REVIEW OF SYSTEMS:   Constitutional: Negative for fever, chills, weight loss, malaise/fatigue and diaphoresis.  HENT: Negative for hearing loss, ear pain, nosebleeds, congestion, sore throat, neck pain, tinnitus and ear discharge.   Eyes: Negative for blurred vision, double vision,  photophobia, pain, discharge and redness.  Respiratory: Negative for cough, hemoptysis, sputum production, shortness of breath, wheezing and stridor.   Cardiovascular: Negative for chest pain, palpitations, orthopnea, claudication, leg swelling and PND.  Gastrointestinal: Negative for heartburn, nausea, vomiting, abdominal pain, diarrhea, constipation, blood in stool and melena.  Genitourinary: Negative for dysuria, urgency, frequency, hematuria and flank pain.  Musculoskeletal: Negative for myalgias, back pain, joint pain and falls.  Skin: Negative for itching and rash.  Neurological: Negative for dizziness, tingling, tremors, sensory change, speech change, focal weakness, seizures, loss of consciousness, weakness and headaches.  Endo/Heme/Allergies: Negative for environmental allergies and polydipsia. Does not bruise/bleed easily.  SUBJECTIVE:   VITAL SIGNS: Temp:  [97.9 F (36.6 C)] 97.9 F (36.6 C) (09/29 1005) Pulse Rate:  [103-111] 104 (09/29 1347) Resp:  [21-30] 21 (09/29 1347) BP: (116-137)/(62-92) 116/62 (09/29 1347) SpO2:  [94 %-100 %] 99 % (09/29 1411) FiO2 (%):  [40 %] 40 % (09/29 1345) Weight:  [99.8 kg] 99.8 kg (09/29 1342)  PHYSICAL EXAMINATION: General: In no acute distress from shortness of breath Neuro:  WNL , AOX3 , EOMI , CN II-XII intact , UL , LL strength is symmetrical and 5/5 HEENT:  atraumatic , no jaundice , dry mucous membranes  Cardiovascular: Tachycardia Lungs:  CTA bilateral , no wheezing or crackles  Abdomen:  Soft lax +BS , no tenderness . Musculoskeletal:  WNL , normal pulses  Skin:  No rash    Recent Labs  Lab 02/06/18 1038  NA 137  K 4.4  CL 107  CO2 19*  BUN 16  CREATININE 1.15  GLUCOSE 189*   Recent Labs  Lab 02/06/18 1038  HGB 16.9  HCT 52.1*  WBC 10.1  PLT 98*   Ct Angio Chest Pe W And/or Wo Contrast  Result Date: 02/06/2018 CLINICAL DATA:  Shortness of breath.  History of lung carcinoma EXAM: CT ANGIOGRAPHY CHEST WITH  CONTRAST TECHNIQUE: Multidetector CT imaging of the chest was performed using the standard protocol during bolus administration of intravenous contrast. Multiplanar CT image reconstructions and MIPs were obtained to evaluate the vascular anatomy. CONTRAST:  100 mL ISOVUE-370 IOPAMIDOL (ISOVUE-370) INJECTION 76% COMPARISON:  Chest CT January 03, 2018; chest radiograph February 06, 2018. FINDINGS: Cardiovascular: There is extensive pulmonary embolism arising from the main pulmonary arteries bilaterally and extending into multiple upper and lower lobe pulmonary artery branches. The right ventricle to left ventricle diameter ratio is 1.7, consistent with right heart strain. There is no thoracic aortic aneurysm or dissection. The visualized great vessels appear unremarkable. No pericardial effusion or pericardial thickening. The main pulmonary outflow tract measures 3.1 cm which is prominent. Mediastinum/Nodes: Thyroid appears unremarkable. There is no appreciable thoracic adenopathy. No esophageal lesions are evident. Lungs/Pleura: There is a moderate pleural effusion on the right with atelectatic change in the right base. There are areas of scarring on the right. There is a small granuloma in the right upper lobe. There is a stable 4 mm nodular opacity in the posterior segment right upper lobe seen on axial slice 50 series 7. There is a stable 4 mm nodular opacity in the superior segment of the right lower lobe seen on axial slice 51 series 7. There is left base atelectasis. There is no edema or consolidation on the left. There is a stable 5 mm nodular opacity in the posterior segment left upper lobe seen on axial slice 26 series 7. No pleural effusion on the left evident. Upper Abdomen: There is reflux of contrast into the inferior vena cava and hepatic veins. There is a cyst arising in the posterior segment right lobe of the liver measuring 4.4 x 3.9 cm. Visualized upper abdominal structures otherwise appear  unremarkable. Musculoskeletal: Patient is status post median sternotomy. There is degenerative change in the thoracic spine with diffuse idiopathic skeletal hyperostosis. There are no appreciable blastic or lytic bone lesions. No chest wall lesions are evident. Review of the MIP images confirms the above findings. IMPRESSION: 1. Positive for acute PE with CT evidence of right heart strain (RV/LV Ratio = 1.7) consistent with at least submassive (intermediate risk) PE. The presence of right heart strain has been associated with an increased risk of morbidity and mortality. Please activate Code PE by paging [phone]. Pulmonary emboli arise in each main pulmonary artery with extension into multiple upper and lower lobe branches. 2.  No thoracic aortic aneurysm or dissection. 3. Prominence of the main pulmonary outflow tract, a finding felt to be indicative of a degree of pulmonary arterial hypertension. 4. Moderate pleural effusion on the right with right base atelectasis. Areas of scarring and volume loss the right. Left base atelectasis. No edema or consolidation on the left. Subcentimeter nodular opacities remain as noted above, stable. 5.  No demonstrable thoracic adenopathy. 6. Reflux of contrast into the inferior vena cava and hepatic veins, a finding felt to be indicative of increase in right heart pressure. 7.  Diffuse idiopathic skeletal hyperostosis in the thoracic spine. Critical Value/emergent results were called by telephone at the time of interpretation on 02/06/2018 at 1:25  pm to Dr. Julianne Rice , who verbally acknowledged these results. Electronically Signed   By: Lowella Grip III M.D.   On: 02/06/2018 13:26   Dg Chest Port 1 View  Result Date: 02/06/2018 CLINICAL DATA:  Shortness of breath EXAM: PORTABLE CHEST 1 VIEW COMPARISON:  CT chest 01/03/2018 FINDINGS: Chronic partially loculated right pleural effusion. No focal consolidation. No pneumothorax. No left pleural effusion. Stable  cardiomediastinal silhouette. Surgical clips in the right hilum. Prior median sternotomy. The osseous structures are unremarkable. IMPRESSION: 1. No acute cardiopulmonary disease. 2. Stable small loculated right pleural effusion. Electronically Signed   By: Kathreen Devoid   On: 02/06/2018 11:21    ASSESSMENT / PLAN:  --Acute submassive PE. --Respiratory distress. --Acute hypoxemic respiratory failure. --Lung cancer stage IV. --Tachycardia.   Plan:  --Stat consult for interventional radiology to start EKOS --Continue with heparin. --Patient will need to be on anticoagulation for life given his history of cancer. --I am suspecting that tachycardia hypoxia will improve once the clot burden will improve. --Continue with biological therapy on the patient's outpatient for lung cancer.     Pulmonary and [employer] Pager: [phone]  02/06/2018, 3:00 PM

## 2018-02-06 NOTE — ED Notes (Addendum)
Pt stated he needs to have a BM and he wants to use bedside commode. I informed pt I spoke with Dr. Milon Dikes and he does not want pt to move out of bed. Bed pan is only option. Pt politely argued with me stating he stood up when he went to his first CT scan. I informed pt we did not know his dx at that time, but now he is not to be moved. I called CT and informed them that pt is not to stand up off of stretcher, he is to be moved by staff. Pt informed me he will wait until he returns from CT to use bedpan and have a BM

## 2018-02-06 NOTE — Progress Notes (Signed)
[place] Progress Note Patient Name: James [name] DOB: 30-Jan-1951 MRN: [id number]   Date of Service  02/06/2018  HPI/Events of Note  Diarrhea> takes imodium at home for drug induced diarrhea Wants to eat  eICU Interventions  Regular diet and imodium     Intervention Category Minor Interventions: Routine modifications to care plan (e.g. PRN medications for pain, fever)  Simonne Maffucci 02/06/2018, 10:02 PM

## 2018-02-06 NOTE — ED Notes (Signed)
Patient transported to CT 

## 2018-02-06 NOTE — ED Notes (Signed)
IV team nurse assessed with ultra sound for a line to be started in [facility] area. IV was obtained right below AC on left arm. No other area found by ultrasound

## 2018-02-06 NOTE — ED Provider Notes (Signed)
[facility] DEPARTMENT Provider Note   CSN: [id number] Arrival date & time: 02/06/18  1001     History   Chief Complaint Chief Complaint  Patient presents with  . Shortness of Breath    HPI James [name] is a 67 y.o. male.  HPI Patient with history of lung cancer on chemotherapy.  States he started having increasing shortness of breath especially with minimal exertion starting 2 days ago.  He had episodic left-sided chest pain but is currently denying chest pain.  Denies productive cough.  No fever or chills.  He has had lower extremity swelling and has been told he has venous insufficiency.  Left leg more swollen and discolored than the right.  Patient has previous history of DVT.  Not currently on any anticoagulants Past Medical History:  Diagnosis Date  . Adenocarcinoma of right lung, stage 1 ([place]) 09/06/2016  . Anxiety   . Arthritis    "knees, hips, back" (10/19/2012)  . Chronic diastolic congestive heart failure ([place])   . Chronic lower back pain   . Colon polyps    10/27/2002, repeat letter 09/17/2007  . Coronary artery disease   . Coronary artery disease involving native coronary artery of native heart without angina pectoris   . Depressive disorder, not elsewhere classified    no meds  . Diabetes mellitus without complication ([place])    diet controlled- no med  (while in hosp 4/18 -elevated cbg  . Dyspnea   . Fasting hyperglycemia   . GERD (gastroesophageal reflux disease)   . Heart murmur   . Hemoptysis    abnormal CT Chest 01/29/10 - ? new GG changes RUL > not viz on plain cxr 02/26/2010  . Hypertension   . Mitral regurgitation    severe MR 08/2016  . MPN (myeloproliferative neoplasm) ([place])    1st detected 06/04/1998  . Obesity   . OSA on CPAP    last sleep study 10 years ago  . Other and unspecified hyperlipidemia   . Peripheral vascular disease ([place]) 08/2016   after lung surgey small clots in lungs,after hip dvt-5/16  . Pneumonia    4/18  . Positive PPD 1965   "non reactive in 2012" (10/19/2012)  . Routine general medical examination at a health care facility   . S/P CABG x 1 03/24/2017   LIMA to LAD  . S/P mitral valve repair 03/24/2017   Complex valvuloplasty including artificial Gore-tex neochord placement x6 and 28 mm Sorin Annuloflex posterior annuloplasty band  . Special screening for malignant neoplasm of prostate   . Spinal stenosis, unspecified region other than cervical   . Wrist pain, left     Patient Active Problem List   Diagnosis Date Noted  . Acute massive pulmonary embolism ([place]) 02/06/2018  . Encounter for antineoplastic chemotherapy 11/03/2017  . Adenocarcinoma of right lung, stage 4 ([place]) 10/15/2017  . Goals of care, counseling/discussion 10/15/2017  . Deficiency anemia 07/28/2017  . Need for shingles vaccine 07/28/2017  . S/P mitral valve repair  03/24/2017  . S/P CABG x 1 03/24/2017  . Coronary artery disease involving native coronary artery of native heart without angina pectoris   . Chronic diastolic congestive heart failure ([place])   . Primary insomnia 10/27/2016  . Pulmonary embolus ([place]) 10/22/2016  . Adenocarcinoma of right lung, stage 1 ([place]) 09/06/2016  . OA (osteoarthritis) of hip 10/05/2014  . OSA on CPAP 07/02/2014  . Chronic venous insufficiency 07/02/2014  . BPH with obstruction/lower urinary tract  symptoms 07/12/2013  . Allergic rhinitis 07/12/2013  . Routine health maintenance 07/31/2011  . Hypogonadism male 07/29/2011  . Obesity 06/08/2007  . Type II diabetes mellitus with manifestations ([place]) 06/08/2007  . Hyperlipidemia with target LDL less than 130 01/18/2007  . GERD 01/18/2007  . Spinal stenosis of lumbar region 01/18/2007    Past Surgical History:  Procedure Laterality Date  . ANTERIOR CRUCIATE LIGAMENT REPAIR Left 1967  . CARDIAC CATHETERIZATION  2000  . CHEST TUBE INSERTION Right 10/19/2012   post bronch  . COLONOSCOPY W/ POLYPECTOMY    . CORONARY ARTERY  BYPASS GRAFT N/A 03/24/2017   Procedure: CORONARY ARTERY BYPASS GRAFTING (CABG)x1 using left internal mammary artery, LIMA-LAD;  Surgeon: Rexene Alberts, MD;  Location: [facility];  Service: Open Heart Surgery;  Laterality: N/A;  . FLEXIBLE BRONCHOSCOPY  10/19/2012   Flexible video fiberoptic bronchoscopy with electromagnetic navigation and biopsies.  . INTRAVASCULAR PRESSURE WIRE/FFR STUDY N/A 08/11/2016   Procedure: Intravascular Pressure Wire/FFR Study;  Surgeon: [name], MD;  Location: [facility];  Service: Cardiovascular;  Laterality: N/A;  . LOBECTOMY Right 09/02/2016   Procedure: RIGHT UPPER LOBECTOMY;  Surgeon: Melrose Nakayama, MD;  Location: [facility];  Service: Thoracic;  Laterality: Right;  . LYMPH NODE DISSECTION Right 09/02/2016   Procedure: LYMPH NODE DISSECTION, RIGHT LUNG;  Surgeon: Melrose Nakayama, MD;  Location: [facility];  Service: Thoracic;  Laterality: Right;  . MITRAL VALVE REPAIR N/A 03/24/2017   Procedure: MITRAL VALVE REPAIR (MVR) with Sorin Carbomedics Annuloflex size 28;  Surgeon: Rexene Alberts, MD;  Location: [facility];  Service: Open Heart Surgery;  Laterality: N/A;  . RIGHT/LEFT HEART CATH AND CORONARY ANGIOGRAPHY N/A 08/11/2016   Procedure: Right/Left Heart Cath and Coronary Angiography;  Surgeon: [name], MD;  Location: [facility];  Service: Cardiovascular;  Laterality: N/A;  . TEE WITHOUT CARDIOVERSION N/A 07/17/2016   Procedure: TRANSESOPHAGEAL ECHOCARDIOGRAM (TEE);  Surgeon: [name], MD;  Location: [facility];  Service: Cardiovascular;  Laterality: N/A;  . TEE WITHOUT CARDIOVERSION N/A 03/24/2017   Procedure: TRANSESOPHAGEAL ECHOCARDIOGRAM (TEE);  Surgeon: Rexene Alberts, MD;  Location: [facility];  Service: Open Heart Surgery;  Laterality: N/A;  . TONSILLECTOMY  1950's  . TOTAL HIP ARTHROPLASTY Left 10/05/2014   dr [name]  . TOTAL HIP ARTHROPLASTY Left 10/05/2014   Procedure: LEFT TOTAL HIP ARTHROPLASTY ANTERIOR APPROACH;  Surgeon:  [name], MD;  Location: [facility];  Service: Orthopedics;  Laterality: Left;  Marland Kitchen VIDEO ASSISTED THORACOSCOPY (VATS)/WEDGE RESECTION Right 09/02/2016   Procedure: VIDEO ASSISTED THORACOSCOPY (VATS)/RIGHT UPPER LOBE WEDGE RESECTION;  Surgeon: Melrose Nakayama, MD;  Location: [facility];  Service: Thoracic;  Laterality: Right;  Marland Kitchen VIDEO BRONCHOSCOPY WITH ENDOBRONCHIAL NAVIGATION N/A 10/19/2012   Procedure: VIDEO BRONCHOSCOPY WITH ENDOBRONCHIAL NAVIGATION;  Surgeon: [name], MD;  Location: [facility];  Service: Thoracic;  Laterality: N/A;  . WRIST RECONSTRUCTION Left 12/2009   'proximal row carpectomy" [place] Medications    Prior to Admission medications   Medication Sig Start Date End Date Taking? Authorizing Provider  acetaminophen (TYLENOL) 325 MG tablet Take 650 mg by mouth every 6 (six) hours as needed for moderate pain or headache.   Yes [provider]  aspirin EC 81 MG tablet Take 1 tablet (81 mg total) by mouth daily. 07/27/16  Yes [name], MD  atorvastatin (LIPITOR) 20 MG tablet TAKE 1 TABLET (20 MG TOTAL) BY MOUTH  DAILY. 12/12/17  Yes [name], MD  furosemide (LASIX) 40 MG tablet TAKE 1 TABLET BY MOUTH EVERY DAY Patient taking differently: Take 40 mg by mouth as needed for fluid.  11/03/17  Yes Martinique, Peter M, MD  metoprolol tartrate (LOPRESSOR) 25 MG tablet TAKE 1/2 TABLET BY MOUTH 2 TIMES DAILY Patient taking differently: Take 12.5 mg by mouth 2 (two) times daily.  06/29/17  Yes Martinique, Peter M, MD  Omega-3 Fatty Acids (FISH OIL) 500 MG CAPS Take 500 mg by mouth daily.    Yes [provider]  potassium chloride SA (K-DUR,KLOR-CON) 20 MEQ tablet Take 1 tablet (20 mEq total) by mouth daily as needed. With lasix 04/14/17  Yes Meng, [place], PA  sodium chloride (OCEAN) 0.65 % SOLN nasal spray Place 1 spray into both nostrils as needed for congestion.   Yes [provider]  TAGRISSO 80 MG tablet TAKE 1 TABLET (80 MG TOTAL) BY MOUTH  DAILY. Patient taking differently: Take 80 mg by mouth daily.  01/19/18  Yes Curt Bears, MD  traMADol (ULTRAM) 50 MG tablet Take 1 tablet (50 mg total) by mouth every 6 (six) hours as needed (may take one or two tablets every six hrs prn). 04/16/17  Yes Rexene Alberts, MD  LORazepam (ATIVAN) 1 MG tablet One tab 30 min before scan Patient not taking: Reported on 02/06/2018 10/20/17   Curt Bears, MD    Family History Family History  Problem Relation Age of Onset  . Heart failure Mother   . Heart disease Father   . Heart attack Father   . Heart disease Paternal Uncle   . Prostate cancer Neg Hx   . Colon cancer Neg Hx   . Hypertension Neg Hx   . Hyperlipidemia Neg Hx   . Diabetes Neg Hx     Social History Social History   Tobacco Use  . Smoking status: Never Smoker  . Smokeless tobacco: Never Used  Substance Use Topics  . Alcohol use: Yes    Alcohol/week: 1.0 standard drinks    Types: 1 Cans of beer per week    Comment: none since 3 weeks  . Drug use: No     Allergies   Symbicort [budesonide-formoterol fumarate] and Amoxicillin   Review of Systems Review of Systems  Constitutional: Negative for chills and fever.  Respiratory: Positive for shortness of breath. Negative for cough.   Cardiovascular: Positive for chest pain and leg swelling. Negative for palpitations.  Gastrointestinal: Negative for abdominal pain, constipation, diarrhea, nausea and vomiting.  Genitourinary: Negative for dysuria, flank pain, frequency and hematuria.  Musculoskeletal: Negative for back pain, myalgias, neck pain and neck stiffness.  Skin: Negative for rash and wound.  Neurological: Negative for dizziness, weakness, light-headedness, numbness and headaches.  All other systems reviewed and are negative.    Physical Exam Updated Vital Signs BP 116/62   Pulse (!) 104   Temp 97.9 F (36.6 C) (Oral)   Resp (!) 21   Wt 99.8 kg Comment: Patient reported  SpO2 99%   BMI 32.49  kg/m   Physical Exam  Constitutional: He is oriented to person, place, and time. He appears well-developed and well-nourished. He appears distressed.  HENT:  Head: Normocephalic and atraumatic.  Mouth/Throat: Oropharynx is clear and moist.  Eyes: Pupils are equal, round, and reactive to light. EOM are normal.  Neck: Normal range of motion. Neck supple. No JVD present.  Cardiovascular: Regular rhythm.  Tachycardia.  Pulmonary/Chest: Breath sounds normal.  Increased  work of breathing.  No definite rhonchi or wheezing appreciated.  Abdominal: Soft. Bowel sounds are normal. There is no tenderness. There is no rebound and no guarding.  Musculoskeletal: Normal range of motion. He exhibits edema. He exhibits no tenderness.  Left greater than right calf swelling.  Mild discoloration of the left lower extremity compared to right.  Distal pulses intact.  Lymphadenopathy:    He has no cervical adenopathy.  Neurological: He is alert and oriented to person, place, and time.  Moving all extremities without focal deficit.  Sensation fully intact.  Skin: Skin is warm and dry. Capillary refill takes less than 2 seconds. No rash noted. No erythema.  Psychiatric: He has a normal mood and affect. His behavior is normal.  Nursing note and vitals reviewed.    ED Treatments / Results  Labs (all labs ordered are listed, but only abnormal results are displayed) Labs Reviewed  CBC WITH DIFFERENTIAL/PLATELET - Abnormal; Notable for the following components:      Result Value   HCT 52.1 (*)    Platelets 98 (*)    Neutro Abs 8.4 (*)    All other components within normal limits  COMPREHENSIVE METABOLIC PANEL - Abnormal; Notable for the following components:   CO2 19 (*)    Glucose, Bld 189 (*)    All other components within normal limits  BRAIN NATRIURETIC PEPTIDE - Abnormal; Notable for the following components:   B Natriuretic Peptide 120.7 (*)    All other components within normal limits  TROPONIN I  - Abnormal; Notable for the following components:   Troponin I 0.52 (*)    All other components within normal limits  HEPARIN LEVEL (UNFRACTIONATED)  HIV ANTIBODY (ROUTINE TESTING W REFLEX)    EKG EKG Interpretation  Date/Time:  Sunday February 06 2018 10:04:30 EDT Ventricular Rate:  111 PR Interval:    QRS Duration: 152 QT Interval:  398 QTC Calculation: 541 R Axis:   -170 Text Interpretation:  Sinus or ectopic atrial tachycardia Atrial premature complexes Nonspecific intraventricular conduction delay Confirmed by Julianne Rice [phone]) on 02/06/2018 10:22:59 AM Also confirmed by Julianne Rice [phone]), editor Shon Hale [phone])  on 02/06/2018 11:39:19 AM   Radiology Ct Angio Chest Pe W And/or Wo Contrast  Result Date: 02/06/2018 CLINICAL DATA:  Shortness of breath.  History of lung carcinoma EXAM: CT ANGIOGRAPHY CHEST WITH CONTRAST TECHNIQUE: Multidetector CT imaging of the chest was performed using the standard protocol during bolus administration of intravenous contrast. Multiplanar CT image reconstructions and MIPs were obtained to evaluate the vascular anatomy. CONTRAST:  100 mL ISOVUE-370 IOPAMIDOL (ISOVUE-370) INJECTION 76% COMPARISON:  Chest CT January 03, 2018; chest radiograph February 06, 2018. FINDINGS: Cardiovascular: There is extensive pulmonary embolism arising from the main pulmonary arteries bilaterally and extending into multiple upper and lower lobe pulmonary artery branches. The right ventricle to left ventricle diameter ratio is 1.7, consistent with right heart strain. There is no thoracic aortic aneurysm or dissection. The visualized great vessels appear unremarkable. No pericardial effusion or pericardial thickening. The main pulmonary outflow tract measures 3.1 cm which is prominent. Mediastinum/Nodes: Thyroid appears unremarkable. There is no appreciable thoracic adenopathy. No esophageal lesions are evident. Lungs/Pleura: There is a moderate pleural effusion  on the right with atelectatic change in the right base. There are areas of scarring on the right. There is a small granuloma in the right upper lobe. There is a stable 4 mm nodular opacity in the posterior segment right upper lobe seen on axial  slice 50 series 7. There is a stable 4 mm nodular opacity in the superior segment of the right lower lobe seen on axial slice 51 series 7. There is left base atelectasis. There is no edema or consolidation on the left. There is a stable 5 mm nodular opacity in the posterior segment left upper lobe seen on axial slice 26 series 7. No pleural effusion on the left evident. Upper Abdomen: There is reflux of contrast into the inferior vena cava and hepatic veins. There is a cyst arising in the posterior segment right lobe of the liver measuring 4.4 x 3.9 cm. Visualized upper abdominal structures otherwise appear unremarkable. Musculoskeletal: Patient is status post median sternotomy. There is degenerative change in the thoracic spine with diffuse idiopathic skeletal hyperostosis. There are no appreciable blastic or lytic bone lesions. No chest wall lesions are evident. Review of the MIP images confirms the above findings. IMPRESSION: 1. Positive for acute PE with CT evidence of right heart strain (RV/LV Ratio = 1.7) consistent with at least submassive (intermediate risk) PE. The presence of right heart strain has been associated with an increased risk of morbidity and mortality. Please activate Code PE by paging [phone]. Pulmonary emboli arise in each main pulmonary artery with extension into multiple upper and lower lobe branches. 2.  No thoracic aortic aneurysm or dissection. 3. Prominence of the main pulmonary outflow tract, a finding felt to be indicative of a degree of pulmonary arterial hypertension. 4. Moderate pleural effusion on the right with right base atelectasis. Areas of scarring and volume loss the right. Left base atelectasis. No edema or consolidation on the  left. Subcentimeter nodular opacities remain as noted above, stable. 5.  No demonstrable thoracic adenopathy. 6. Reflux of contrast into the inferior vena cava and hepatic veins, a finding felt to be indicative of increase in right heart pressure. 7.  Diffuse idiopathic skeletal hyperostosis in the thoracic spine. Critical Value/emergent results were called by telephone at the time of interpretation on 02/06/2018 at 1:25 pm to Dr. Julianne Rice , who verbally acknowledged these results. Electronically Signed   By: Lowella Grip III M.D.   On: 02/06/2018 13:26   Dg Chest Port 1 View  Result Date: 02/06/2018 CLINICAL DATA:  Shortness of breath EXAM: PORTABLE CHEST 1 VIEW COMPARISON:  CT chest 01/03/2018 FINDINGS: Chronic partially loculated right pleural effusion. No focal consolidation. No pneumothorax. No left pleural effusion. Stable cardiomediastinal silhouette. Surgical clips in the right hilum. Prior median sternotomy. The osseous structures are unremarkable. IMPRESSION: 1. No acute cardiopulmonary disease. 2. Stable small loculated right pleural effusion. Electronically Signed   By: Kathreen Devoid   On: 02/06/2018 11:21    Procedures Procedures (including critical care time)  Medications Ordered in ED Medications  heparin bolus via infusion 5,500 Units (has no administration in time range)  heparin ADULT infusion 100 units/mL (25000 units/228mL sodium chloride 0.45%) (has no administration in time range)  aspirin chewable tablet 324 mg (has no administration in time range)    Or  aspirin suppository 300 mg (has no administration in time range)  iopamidol (ISOVUE-370) 76 % injection (100 mLs  Contrast Given 02/06/18 1254)   CRITICAL CARE Performed by: Julianne Rice Total critical care time: 50 minutes Critical care time was exclusive of separately billable procedures and treating other patients. Critical care was necessary to treat or prevent imminent or life-threatening  deterioration. Critical care was time spent personally by me on the following activities: development of treatment plan with patient  and/or surrogate as well as nursing, discussions with consultants, evaluation of patient's response to treatment, examination of patient, obtaining history from patient or surrogate, ordering and performing treatments and interventions, ordering and review of laboratory studies, ordering and review of radiographic studies, pulse oximetry and re-evaluation of patient's condition.  Initial Impression / Assessment and Plan / ED Course  I have reviewed the triage vital signs and the nursing notes.  Pertinent labs & imaging results that were available during my care of the patient were reviewed by me and considered in my medical decision making (see chart for details).     Discussed with critical care.  Will evaluate patient in the emergency department for thrombolytics.  We will go ahead and start heparin.  Given patient's increased work of breathing will start on BiPAP.  Maintaining saturations in the mid 90s on 2 L.  Maintaining normal blood pressure.  Final Clinical Impressions(s) / ED Diagnoses   Final diagnoses:  Bilateral pulmonary embolism [facility])    ED Discharge Orders    None       Julianne Rice, MD 02/06/18 1418

## 2018-02-06 NOTE — Progress Notes (Signed)
RT NOTE: Per Dr. Milon Dikes RT took patient off bipap and placed patient on 5L HFNC salter. RT will continue to monitor.

## 2018-02-06 NOTE — ED Notes (Signed)
Dr. Barbie Banner at bedside

## 2018-02-06 NOTE — ED Notes (Signed)
Pulmonology at bedside.

## 2018-02-06 NOTE — Consult Note (Signed)
Chief Complaint: Patient was seen in consultation today for  Chief Complaint  Patient presents with  . Shortness of Breath   at the request of * No referring provider recorded for this case *  Referring Physician(s): * No referring provider recorded for this case *  Supervising Physician: Marybelle Killings  Patient Status: [facility] - ED  History of Present Illness: James [name] is a 67 y.o. male undergoing treatment for AdenoCA of the right lung. He presented with difficulty breathing and was found to have submassive PE, RV/LV ratio 1.7, positive troponins. He did have a recent right thora positive for adenoCA cells. He does describe bilateral pedal edema for one week, and difficulty breathing over the last three days which has progressively been getting worse. It is most severe today. Brain MRI 3 months ago was negative. No history of hemoptysis or GI bleeding. He is comfortable in bed, but becomes tachypneic if he moves or gets up. He denies chest pain.  Past Medical History:  Diagnosis Date  . Adenocarcinoma of right lung, stage 1 ([place]) 09/06/2016  . Anxiety   . Arthritis    "knees, hips, back" (10/19/2012)  . Chronic diastolic congestive heart failure ([place])   . Chronic lower back pain   . Colon polyps    10/27/2002, repeat letter 09/17/2007  . Coronary artery disease   . Coronary artery disease involving native coronary artery of native heart without angina pectoris   . Depressive disorder, not elsewhere classified    no meds  . Diabetes mellitus without complication ([place])    diet controlled- no med  (while in hosp 4/18 -elevated cbg  . Dyspnea   . Fasting hyperglycemia   . GERD (gastroesophageal reflux disease)   . Heart murmur   . Hemoptysis    abnormal CT Chest 01/29/10 - ? new GG changes RUL > not viz on plain cxr 02/26/2010  . Hypertension   . Mitral regurgitation    severe MR 08/2016  . MPN (myeloproliferative neoplasm) ([place])    1st detected 06/04/1998  . Obesity   . OSA  on CPAP    last sleep study 10 years ago  . Other and unspecified hyperlipidemia   . Peripheral vascular disease ([place]) 08/2016   after lung surgey small clots in lungs,after hip dvt-5/16  . Pneumonia    4/18  . Positive PPD 1965   "non reactive in 2012" (10/19/2012)  . Routine general medical examination at a health care facility   . S/P CABG x 1 03/24/2017   LIMA to LAD  . S/P mitral valve repair 03/24/2017   Complex valvuloplasty including artificial Gore-tex neochord placement x6 and 28 mm Sorin Annuloflex posterior annuloplasty band  . Special screening for malignant neoplasm of prostate   . Spinal stenosis, unspecified region other than cervical   . Wrist pain, left     Past Surgical History:  Procedure Laterality Date  . ANTERIOR CRUCIATE LIGAMENT REPAIR Left 1967  . CARDIAC CATHETERIZATION  2000  . CHEST TUBE INSERTION Right 10/19/2012   post bronch  . COLONOSCOPY W/ POLYPECTOMY    . CORONARY ARTERY BYPASS GRAFT N/A 03/24/2017   Procedure: CORONARY ARTERY BYPASS GRAFTING (CABG)x1 using left internal mammary artery, LIMA-LAD;  Surgeon: Rexene Alberts, MD;  Location: [place];  Service: Open Heart Surgery;  Laterality: N/A;  . FLEXIBLE BRONCHOSCOPY  10/19/2012   Flexible video fiberoptic bronchoscopy with electromagnetic navigation and biopsies.  . INTRAVASCULAR PRESSURE WIRE/FFR STUDY N/A 08/11/2016   Procedure:  Intravascular Pressure Wire/FFR Study;  Surgeon: [name], MD;  Location: [place] [facility];  Service: Cardiovascular;  Laterality: N/A;  . LOBECTOMY Right 09/02/2016   Procedure: RIGHT UPPER LOBECTOMY;  Surgeon: Melrose Nakayama, MD;  Location: [place];  Service: Thoracic;  Laterality: Right;  . LYMPH NODE DISSECTION Right 09/02/2016   Procedure: LYMPH NODE DISSECTION, RIGHT LUNG;  Surgeon: Melrose Nakayama, MD;  Location: [place];  Service: Thoracic;  Laterality: Right;  . MITRAL VALVE REPAIR N/A 03/24/2017   Procedure: MITRAL VALVE REPAIR (MVR) with Sorin  [employer]bomedics Annuloflex size 28;  Surgeon: Rexene Alberts, MD;  Location: [place];  Service: Open Heart Surgery;  Laterality: N/A;  . RIGHT/LEFT HEART CATH AND CORONARY ANGIOGRAPHY N/A 08/11/2016   Procedure: Right/Left Heart Cath and Coronary Angiography;  Surgeon: [name], MD;  Location: [place] [facility];  Service: Cardiovascular;  Laterality: N/A;  . TEE WITHOUT CARDIOVERSION N/A 07/17/2016   Procedure: TRANSESOPHAGEAL ECHOCARDIOGRAM (TEE);  Surgeon: [name], MD;  Location: [facility] ENDOSCOPY;  Service: Cardiovascular;  Laterality: N/A;  . TEE WITHOUT CARDIOVERSION N/A 03/24/2017   Procedure: TRANSESOPHAGEAL ECHOCARDIOGRAM (TEE);  Surgeon: Rexene Alberts, MD;  Location: [place];  Service: Open Heart Surgery;  Laterality: N/A;  . TONSILLECTOMY  1950's  . TOTAL HIP ARTHROPLASTY Left 10/05/2014   dr [name]  . TOTAL HIP ARTHROPLASTY Left 10/05/2014   Procedure: LEFT TOTAL HIP ARTHROPLASTY ANTERIOR APPROACH;  Surgeon: [name], MD;  Location: [place];  Service: Orthopedics;  Laterality: Left;  Marland Kitchen VIDEO ASSISTED THORACOSCOPY (VATS)/WEDGE RESECTION Right 09/02/2016   Procedure: VIDEO ASSISTED THORACOSCOPY (VATS)/RIGHT UPPER LOBE WEDGE RESECTION;  Surgeon: Melrose Nakayama, MD;  Location: [place];  Service: Thoracic;  Laterality: Right;  Marland Kitchen VIDEO BRONCHOSCOPY WITH ENDOBRONCHIAL NAVIGATION N/A 10/19/2012   Procedure: VIDEO BRONCHOSCOPY WITH ENDOBRONCHIAL NAVIGATION;  Surgeon: [name], MD;  Location: [place];  Service: Thoracic;  Laterality: N/A;  . WRIST RECONSTRUCTION Left 12/2009   'proximal row carpectomy" [name]    Allergies: Symbicort [budesonide-formoterol fumarate] and Amoxicillin  Medications: Prior to Admission medications   Medication Sig Start Date End Date Taking? Authorizing Provider  acetaminophen (TYLENOL) 325 MG tablet Take 650 mg by mouth every 6 (six) hours as needed for moderate pain or headache.   Yes [provider]  aspirin EC 81 MG tablet Take 1 tablet  (81 mg total) by mouth daily. 07/27/16  Yes [name], MD  atorvastatin (LIPITOR) 20 MG tablet TAKE 1 TABLET (20 MG TOTAL) BY MOUTH DAILY. 12/12/17  Yes [name], MD  furosemide (LASIX) 40 MG tablet TAKE 1 TABLET BY MOUTH EVERY DAY Patient taking differently: Take 40 mg by mouth as needed for fluid.  11/03/17  Yes Martinique, Peter M, MD  metoprolol tartrate (LOPRESSOR) 25 MG tablet TAKE 1/2 TABLET BY MOUTH 2 TIMES DAILY Patient taking differently: Take 12.5 mg by mouth 2 (two) times daily.  06/29/17  Yes Martinique, Peter M, MD  Omega-3 Fatty Acids (FISH OIL) 500 MG CAPS Take 500 mg by mouth daily.    Yes [provider]  potassium chloride SA (K-DUR,KLOR-CON) 20 MEQ tablet Take 1 tablet (20 mEq total) by mouth daily as needed. With lasix 04/14/17  Yes Meng, [place], PA  sodium chloride (OCEAN) 0.65 % SOLN nasal spray Place 1 spray into both nostrils as needed for congestion.   Yes [provider]  TAGRISSO 80 MG tablet TAKE 1 TABLET (80 MG TOTAL) BY MOUTH DAILY. Patient  taking differently: Take 80 mg by mouth daily.  01/19/18  Yes Curt Bears, MD  traMADol (ULTRAM) 50 MG tablet Take 1 tablet (50 mg total) by mouth every 6 (six) hours as needed (may take one or two tablets every six hrs prn). 04/16/17  Yes Rexene Alberts, MD  LORazepam (ATIVAN) 1 MG tablet One tab 30 min before scan Patient not taking: Reported on 02/06/2018 10/20/17   Curt Bears, MD     Family History  Problem Relation Age of Onset  . Heart failure Mother   . Heart disease Father   . Heart attack Father   . Heart disease Paternal Uncle   . Prostate cancer Neg Hx   . Colon cancer Neg Hx   . Hypertension Neg Hx   . Hyperlipidemia Neg Hx   . Diabetes Neg Hx     Social History   Socioeconomic History  . Marital status: Married    Spouse name: Not on file  . Number of children: 3  . Years of education: Not on file  . Highest education level: Not on file  Occupational History  . Occupation:  Retired, Financial risk analyst  . Occupation: Retired, Real estate    Comment: [employer]  . Financial resource strain: Not on file  . Food insecurity:    Worry: Not on file    Inability: Not on file  . Transportation needs:    Medical: Not on file    Non-medical: Not on file  Tobacco Use  . Smoking status: Never Smoker  . Smokeless tobacco: Never Used  Substance and Sexual Activity  . Alcohol use: Yes    Alcohol/week: 1.0 standard drinks    Types: 1 Cans of beer per week    Comment: none since 3 weeks  . Drug use: No  . Sexual activity: Never    Partners: Female  Lifestyle  . Physical activity:    Days per week: Not on file    Minutes per session: Not on file  . Stress: Not on file  Relationships  . Social connections:    Talks on phone: Not on file    Gets together: Not on file    Attends religious service: Not on file    Active member of club or organization: Not on file    Attends meetings of clubs or organizations: Not on file    Relationship status: Not on file  Other Topics Concern  . Not on file  Social History Narrative   HSG, [employer]. Married '73.  2 sons - '74,   '76;  1 daughter -  '77  6 grandchildren.   Work - IT consultant now retired. ACP - not fully discussed.                     Review of Systems: A 12 point ROS discussed and pertinent positives are indicated in the HPI above.  All other systems are negative.  Review of Systems  Vital Signs: BP 135/82   Pulse (!) 108   Temp 97.9 F (36.6 C) (Oral)   Resp (!) 33   Wt 99.8 kg Comment: Patient reported  SpO2 98%   BMI 32.49 kg/m   Physical Exam  Constitutional: He is oriented to person, place, and time. He appears well-developed and well-nourished.  HENT:  Head: Normocephalic and atraumatic.  Cardiovascular: Normal rate and regular rhythm.  Pulmonary/Chest: Breath sounds normal. He is in respiratory distress.  Neurological:  He is alert and oriented  to person, place, and time.  Skin: Skin is warm and dry.    Imaging: Ct Angio Chest Pe W And/or Wo Contrast  Result Date: 02/06/2018 CLINICAL DATA:  Shortness of breath.  History of lung carcinoma EXAM: CT ANGIOGRAPHY CHEST WITH CONTRAST TECHNIQUE: Multidetector CT imaging of the chest was performed using the standard protocol during bolus administration of intravenous contrast. Multiplanar CT image reconstructions and MIPs were obtained to evaluate the vascular anatomy. CONTRAST:  100 mL ISOVUE-370 IOPAMIDOL (ISOVUE-370) INJECTION 76% COMPARISON:  Chest CT January 03, 2018; chest radiograph February 06, 2018. FINDINGS: Cardiovascular: There is extensive pulmonary embolism arising from the main pulmonary arteries bilaterally and extending into multiple upper and lower lobe pulmonary artery branches. The right ventricle to left ventricle diameter ratio is 1.7, consistent with right heart strain. There is no thoracic aortic aneurysm or dissection. The visualized great vessels appear unremarkable. No pericardial effusion or pericardial thickening. The main pulmonary outflow tract measures 3.1 cm which is prominent. Mediastinum/Nodes: Thyroid appears unremarkable. There is no appreciable thoracic adenopathy. No esophageal lesions are evident. Lungs/Pleura: There is a moderate pleural effusion on the right with atelectatic change in the right base. There are areas of scarring on the right. There is a small granuloma in the right upper lobe. There is a stable 4 mm nodular opacity in the posterior segment right upper lobe seen on axial slice 50 series 7. There is a stable 4 mm nodular opacity in the superior segment of the right lower lobe seen on axial slice 51 series 7. There is left base atelectasis. There is no edema or consolidation on the left. There is a stable 5 mm nodular opacity in the posterior segment left upper lobe seen on axial slice 26 series 7. No pleural effusion on the left evident. Upper Abdomen:  There is reflux of contrast into the inferior vena cava and hepatic veins. There is a cyst arising in the posterior segment right lobe of the liver measuring 4.4 x 3.9 cm. Visualized upper abdominal structures otherwise appear unremarkable. Musculoskeletal: Patient is status post median sternotomy. There is degenerative change in the thoracic spine with diffuse idiopathic skeletal hyperostosis. There are no appreciable blastic or lytic bone lesions. No chest wall lesions are evident. Review of the MIP images confirms the above findings. IMPRESSION: 1. Positive for acute PE with CT evidence of right heart strain (RV/LV Ratio = 1.7) consistent with at least submassive (intermediate risk) PE. The presence of right heart strain has been associated with an increased risk of morbidity and mortality. Please activate Code PE by paging [phone]. Pulmonary emboli arise in each main pulmonary artery with extension into multiple upper and lower lobe branches. 2.  No thoracic aortic aneurysm or dissection. 3. Prominence of the main pulmonary outflow tract, a finding felt to be indicative of a degree of pulmonary arterial hypertension. 4. Moderate pleural effusion on the right with right base atelectasis. Areas of scarring and volume loss the right. Left base atelectasis. No edema or consolidation on the left. Subcentimeter nodular opacities remain as noted above, stable. 5.  No demonstrable thoracic adenopathy. 6. Reflux of contrast into the inferior vena cava and hepatic veins, a finding felt to be indicative of increase in right heart pressure. 7.  Diffuse idiopathic skeletal hyperostosis in the thoracic spine. Critical Value/emergent results were called by telephone at the time of interpretation on 02/06/2018 at 1:25 pm to Dr. Julianne Rice , who verbally acknowledged these results. Electronically  Signed   By: Lowella Grip III M.D.   On: 02/06/2018 13:26   Dg Chest Port 1 View  Result Date: 02/06/2018 CLINICAL  DATA:  Shortness of breath EXAM: PORTABLE CHEST 1 VIEW COMPARISON:  CT chest 01/03/2018 FINDINGS: Chronic partially loculated right pleural effusion. No focal consolidation. No pneumothorax. No left pleural effusion. Stable cardiomediastinal silhouette. Surgical clips in the right hilum. Prior median sternotomy. The osseous structures are unremarkable. IMPRESSION: 1. No acute cardiopulmonary disease. 2. Stable small loculated right pleural effusion. Electronically Signed   By: Kathreen Devoid   On: 02/06/2018 11:21    Labs:  CBC: Recent Labs    11/24/17 1043 12/06/17 1130 01/03/18 1319 02/06/18 1038  WBC 6.8 6.3 6.6 10.1  HGB 15.4 15.5 15.5 16.9  HCT 47.1 46.8 46.6 52.1*  PLT 148 138* 110* 98*    COAGS: Recent Labs    03/22/17 1100 03/24/17 1506  04/14/17 0900 04/23/17 1108 04/30/17 0922 05/14/17 1147  INR 0.95 1.27   < > 2.4 3.0 2.7 2.4  APTT 27 31  --   --   --   --   --    < > = values in this interval not displayed.    BMP: Recent Labs    11/24/17 1043 12/06/17 1130 01/03/18 1319 02/06/18 1038  NA 139 140 141 137  K 4.4 4.6 4.7 4.4  CL 108 108 106 107  CO2 25 25 28  19*  GLUCOSE 109* 115* 99 189*  BUN 21 20 15 16   CALCIUM 9.5 9.4 9.5 9.0  CREATININE 1.27* 1.18 1.22 1.15  GFRNONAA 56* >60 59* >60  GFRAA >60 >60 >60 >60    LIVER FUNCTION TESTS: Recent Labs    11/24/17 1043 12/06/17 1130 01/03/18 1319 02/06/18 1038  BILITOT 0.6 0.5 0.7 1.2  AST 31 29 33 37  ALT 36 32 55* 40  ALKPHOS 66 66 54 55  PROT 7.1 7.1 6.8 6.7  ALBUMIN 4.1 4.1 4.0 4.0    TUMOR MARKERS: No results for input(s): AFPTM, CEA, CA199, CHROMGRNA in the last 8760 hours.  Assessment and Plan:  The patient has submassive PE with moderate symptoms. He is a candidate for EKOS PE lysis. We will obtain a head CT to evaluate for new mets. He will consider his options and decide shortly to proceed or try anticoagulants alone overnight.  Thank you for this interesting consult.  I greatly  enjoyed meeting MAEJOR ERVEN and look forward to participating in their care.  A copy of this report was sent to the requesting provider on this date.  Electronically Signed: Emony Dormer, ART A, MD 02/06/2018, 4:05 PM   I spent a total of 26 Miinutes  in face to face in clinical consultation, greater than 50% of which was counseling/coordinating care for EKOS pulmonary artery lysis.

## 2018-02-06 NOTE — Progress Notes (Signed)
ANTICOAGULATION CONSULT NOTE - Follow Up Consult  Pharmacy Consult for heparin Indication: pulmonary embolus  Allergies  Allergen Reactions  . Symbicort [Budesonide-Formoterol Fumarate] Other (See Comments) and Cough    Chest pain, wheezing   . Amoxicillin Itching and Other (See Comments)    Has patient had a PCN reaction causing immediate rash, facial/tongue/throat swelling, SOB or lightheadedness with hypotension: No Has patient had a PCN reaction causing severe rash involving mucus membranes or skin necrosis: No Has patient had a PCN reaction that required hospitalization No Has patient had a PCN reaction occurring within the last 10 years: No If all of the above answers are "NO", then may proceed with Cephalosporin use.     Patient Measurements: Weight: 220 lb (99.8 kg)(Patient reported) Heparin Dosing Weight: ~92 kg  Vital Signs: Temp: 97.9 F (36.6 C) (09/29 1005) Temp Source: Oral (09/29 1005) BP: 116/88 (09/29 1955) Pulse Rate: 100 (09/29 1955)  Labs: Recent Labs    02/06/18 1038 02/06/18 1842  HGB 16.9  --   HCT 52.1*  --   PLT 98*  --   LABPROT 14.6  --   INR 1.15  --   HEPARINUNFRC  --  <0.10*  CREATININE 1.15  --   TROPONINI 0.52*  --     Estimated Creatinine Clearance: 71.6 mL/min (by C-G formula based on SCr of 1.15 mg/dL).   Medical History: Past Medical History:  Diagnosis Date  . Adenocarcinoma of right lung, stage 1 ([place]) 09/06/2016  . Anxiety   . Arthritis    "knees, hips, back" (10/19/2012)  . Chronic diastolic congestive heart failure ([place])   . Chronic lower back pain   . Colon polyps    10/27/2002, repeat letter 09/17/2007  . Coronary artery disease   . Coronary artery disease involving native coronary artery of native heart without angina pectoris   . Depressive disorder, not elsewhere classified    no meds  . Diabetes mellitus without complication ([place])    diet controlled- no med  (while in hosp 4/18 -elevated cbg  . Dyspnea   .  Fasting hyperglycemia   . GERD (gastroesophageal reflux disease)   . Heart murmur   . Hemoptysis    abnormal CT Chest 01/29/10 - ? new GG changes RUL > not viz on plain cxr 02/26/2010  . Hypertension   . Mitral regurgitation    severe MR 08/2016  . MPN (myeloproliferative neoplasm) ([place])    1st detected 06/04/1998  . Obesity   . OSA on CPAP    last sleep study 10 years ago  . Other and unspecified hyperlipidemia   . Peripheral vascular disease ([place]) 08/2016   after lung surgey small clots in lungs,after hip dvt-5/16  . Pneumonia    4/18  . Positive PPD 1965   "non reactive in 2012" (10/19/2012)  . Routine general medical examination at a health care facility   . S/P CABG x 1 03/24/2017   LIMA to LAD  . S/P mitral valve repair 03/24/2017   Complex valvuloplasty including artificial Gore-tex neochord placement x6 and 28 mm Sorin Annuloflex posterior annuloplasty band  . Special screening for malignant neoplasm of prostate   . Spinal stenosis, unspecified region other than cervical   . Wrist pain, left    Assessment: 67 yo M presents with SOB. CT shows submassive PE with heart strain. Pharmacy consulted to manage heparin. Patient is currently in IR for EKOS with bilateral intra-arterial tPA. Initial heparin level on 1550 units/hr is undetectable.  Goal of Therapy:  Heparin level 0.3-0.7 units/ml Monitor platelets by anticoagulation protocol: Yes   Plan:  Increase heparin gtt to 1,850 units/hr. No bolus  Monitor q6h heparin level x 4 while on IA tPA  Monitor daily heparin level, CBC, s/s of bleed   Albertina Parr, PharmD., BCPS Clinical Pharmacist Clinical phone for 02/06/18 until 8:30pm: [phone]

## 2018-02-06 NOTE — Procedures (Signed)
B pulmonary angiogram and PE EKOS Lysis PAP 47/22 (32) 12 cm B EKOS catheters placed EBL 0 Comp 0

## 2018-02-06 NOTE — Progress Notes (Signed)
ANTICOAGULATION CONSULT NOTE - Initial Consult  Pharmacy Consult for heparin Indication: pulmonary embolus  Allergies  Allergen Reactions  . Symbicort [Budesonide-Formoterol Fumarate] Other (See Comments) and Cough    Chest pain, wheezing   . Amoxicillin Itching and Other (See Comments)    Has patient had a PCN reaction causing immediate rash, facial/tongue/throat swelling, SOB or lightheadedness with hypotension: No Has patient had a PCN reaction causing severe rash involving mucus membranes or skin necrosis: No Has patient had a PCN reaction that required hospitalization No Has patient had a PCN reaction occurring within the last 10 years: No If all of the above answers are "NO", then may proceed with Cephalosporin use.     Patient Measurements: Weight: 220 lb (99.8 kg)(Patient reported) Heparin Dosing Weight: ~92 kg  Vital Signs: Temp: 97.9 F (36.6 C) (09/29 1005) Temp Source: Oral (09/29 1005) BP: 116/62 (09/29 1347) Pulse Rate: 104 (09/29 1347)  Labs: Recent Labs    02/06/18 1038  HGB 16.9  HCT 52.1*  PLT 98*  CREATININE 1.15  TROPONINI 0.52*    Estimated Creatinine Clearance: 71.6 mL/min (by C-G formula based on SCr of 1.15 mg/dL).   Medical History: Past Medical History:  Diagnosis Date  . Adenocarcinoma of right lung, stage 1 ([place]) 09/06/2016  . Anxiety   . Arthritis    "knees, hips, back" (10/19/2012)  . Chronic diastolic congestive heart failure ([place])   . Chronic lower back pain   . Colon polyps    10/27/2002, repeat letter 09/17/2007  . Coronary artery disease   . Coronary artery disease involving native coronary artery of native heart without angina pectoris   . Depressive disorder, not elsewhere classified    no meds  . Diabetes mellitus without complication ([place])    diet controlled- no med  (while in hosp 4/18 -elevated cbg  . Dyspnea   . Fasting hyperglycemia   . GERD (gastroesophageal reflux disease)   . Heart murmur   . Hemoptysis    abnormal CT Chest 01/29/10 - ? new GG changes RUL > not viz on plain cxr 02/26/2010  . Hypertension   . Mitral regurgitation    severe MR 08/2016  . MPN (myeloproliferative neoplasm) ([place])    1st detected 06/04/1998  . Obesity   . OSA on CPAP    last sleep study 10 years ago  . Other and unspecified hyperlipidemia   . Peripheral vascular disease ([place]) 08/2016   after lung surgey small clots in lungs,after hip dvt-5/16  . Pneumonia    4/18  . Positive PPD 1965   "non reactive in 2012" (10/19/2012)  . Routine general medical examination at a health care facility   . S/P CABG x 1 03/24/2017   LIMA to LAD  . S/P mitral valve repair 03/24/2017   Complex valvuloplasty including artificial Gore-tex neochord placement x6 and 28 mm Sorin Annuloflex posterior annuloplasty band  . Special screening for malignant neoplasm of prostate   . Spinal stenosis, unspecified region other than cervical   . Wrist pain, left    Assessment: 67 yo M presents with SOB. CT shows PE. Pharmacy consulted to start heparin. Hgb stable, plts low at 98.  Goal of Therapy:  Heparin level 0.3-0.7 units/ml Monitor platelets by anticoagulation protocol: Yes   Plan:  Give heparin 5,500 unit bolus Start heparin gtt at 1,550 units/hr Monitor daily heparin level, CBC, s/s of bleed   Beaumont Austad J 02/06/2018,1:51 PM

## 2018-02-06 NOTE — ED Triage Notes (Signed)
Patient arrives with [facility] with complaint of shortness of breath. Patient states it first started Friday but got significantly worse this morning. History of lung cancer currently undergoing chemotherapy, mitral valve replacement 10 months ago. Takes lasix as needed, last dose approximately one week ago. On room air patient tachypnic, O2 saturation 94%. Patient alert and oriented at this time.

## 2018-02-06 NOTE — Progress Notes (Signed)
RT NOTE: RT placed patient on bipap per MD order. Patient is tolerating well and states he is comfortable for the most part. Settings are 10/5 40% and patient is sating 100%. RT will continue to monitor.

## 2018-02-06 NOTE — ED Notes (Signed)
Critical Troponin results given to Dr. Lita Mains

## 2018-02-06 NOTE — Progress Notes (Signed)
RT NOTE: RT was called to look at C20 by charge RN. Patient seems to have some shortness of breath with exertion. RN stated that MD wants to hold off on bipap for right now. Patient is sating 97% on 2L [place]. BS clear and diminished. RT will continue to monitor.

## 2018-02-07 ENCOUNTER — Other Ambulatory Visit: Payer: Self-pay

## 2018-02-07 ENCOUNTER — Encounter (HOSPITAL_COMMUNITY): Payer: Self-pay | Admitting: Interventional Radiology

## 2018-02-07 ENCOUNTER — Inpatient Hospital Stay (HOSPITAL_COMMUNITY): Payer: Medicare Other

## 2018-02-07 DIAGNOSIS — I2699 Other pulmonary embolism without acute cor pulmonale: Principal | ICD-10-CM

## 2018-02-07 DIAGNOSIS — R0600 Dyspnea, unspecified: Secondary | ICD-10-CM

## 2018-02-07 DIAGNOSIS — I82409 Acute embolism and thrombosis of unspecified deep veins of unspecified lower extremity: Secondary | ICD-10-CM

## 2018-02-07 HISTORY — PX: IR THROMB F/U EVAL ART/VEN FINAL DAY (MS): IMG5379

## 2018-02-07 LAB — CBC WITH DIFFERENTIAL/PLATELET
Abs Immature Granulocytes: 0.1 10*3/uL (ref 0.0–0.1)
Basophils Absolute: 0 10*3/uL (ref 0.0–0.1)
Basophils Relative: 0 %
Eosinophils Absolute: 0.1 10*3/uL (ref 0.0–0.7)
Eosinophils Relative: 1 %
HCT: 45.2 % (ref 39.0–52.0)
Hemoglobin: 14.5 g/dL (ref 13.0–17.0)
Immature Granulocytes: 1 %
Lymphocytes Relative: 15 %
Lymphs Abs: 1.4 10*3/uL (ref 0.7–4.0)
MCH: 29.5 pg (ref 26.0–34.0)
MCHC: 32.1 g/dL (ref 30.0–36.0)
MCV: 92.1 fL (ref 78.0–100.0)
Monocytes Absolute: 0.9 10*3/uL (ref 0.1–1.0)
Monocytes Relative: 10 %
Neutro Abs: 6.6 10*3/uL (ref 1.7–7.7)
Neutrophils Relative %: 73 %
Platelets: 98 10*3/uL — ABNORMAL LOW (ref 150–400)
RBC: 4.91 MIL/uL (ref 4.22–5.81)
RDW: 14.6 % (ref 11.5–15.5)
WBC: 9.1 10*3/uL (ref 4.0–10.5)

## 2018-02-07 LAB — CBC
HCT: 46.2 % (ref 39.0–52.0)
HCT: 44.6 % (ref 39.0–52.0)
Hemoglobin: 15.2 g/dL (ref 13.0–17.0)
Hemoglobin: 14.3 g/dL (ref 13.0–17.0)
MCH: 29.9 pg (ref 26.0–34.0)
MCH: 29.4 pg (ref 26.0–34.0)
MCHC: 32.9 g/dL (ref 30.0–36.0)
MCHC: 32.1 g/dL (ref 30.0–36.0)
MCV: 90.8 fL (ref 78.0–100.0)
MCV: 91.6 fL (ref 78.0–100.0)
Platelets: 99 10*3/uL — ABNORMAL LOW (ref 150–400)
Platelets: 101 10*3/uL — ABNORMAL LOW (ref 150–400)
RBC: 5.09 MIL/uL (ref 4.22–5.81)
RBC: 4.87 MIL/uL (ref 4.22–5.81)
RDW: 14.5 % (ref 11.5–15.5)
RDW: 14.5 % (ref 11.5–15.5)
WBC: 10.8 10*3/uL — ABNORMAL HIGH (ref 4.0–10.5)
WBC: 8.1 10*3/uL (ref 4.0–10.5)

## 2018-02-07 LAB — PROTIME-INR
INR: 1.28
Prothrombin Time: 15.9 seconds — ABNORMAL HIGH (ref 11.4–15.2)

## 2018-02-07 LAB — APTT: aPTT: >200 seconds (ref 24–36)

## 2018-02-07 LAB — COMPREHENSIVE METABOLIC PANEL
ALT: 27 U/L (ref 0–44)
AST: 29 U/L (ref 15–41)
Albumin: 3.1 g/dL — ABNORMAL LOW (ref 3.5–5.0)
Alkaline Phosphatase: 40 U/L (ref 38–126)
Anion gap: 6 (ref 5–15)
BUN: 14 mg/dL (ref 8–23)
CO2: 23 mmol/L (ref 22–32)
Calcium: 8.4 mg/dL — ABNORMAL LOW (ref 8.9–10.3)
Chloride: 110 mmol/L (ref 98–111)
Creatinine, Ser: 1.02 mg/dL (ref 0.61–1.24)
GFR calc Af Amer: >60 mL/min (ref >60)
GFR calc non Af Amer: >60 mL/min (ref >60)
Glucose, Bld: 115 mg/dL — ABNORMAL HIGH (ref 70–99)
Potassium: 3.9 mmol/L (ref 3.5–5.1)
Sodium: 139 mmol/L (ref 135–145)
Total Bilirubin: 1.3 mg/dL — ABNORMAL HIGH (ref 0.3–1.2)
Total Protein: 5.5 g/dL — ABNORMAL LOW (ref 6.5–8.1)

## 2018-02-07 LAB — HIV ANTIBODY (ROUTINE TESTING W REFLEX): HIV Screen 4th Generation wRfx: NONREACTIVE

## 2018-02-07 LAB — FIBRINOGEN
Fibrinogen: 273 mg/dL (ref 210–475)
Fibrinogen: 281 mg/dL (ref 210–475)
Fibrinogen: 314 mg/dL (ref 210–475)

## 2018-02-07 LAB — HEPARIN LEVEL (UNFRACTIONATED)
Heparin Unfractionated: 0.99 IU/mL — ABNORMAL HIGH (ref 0.30–0.70)
Heparin Unfractionated: 0.83 IU/mL — ABNORMAL HIGH (ref 0.30–0.70)
Heparin Unfractionated: 0.62 IU/mL (ref 0.30–0.70)

## 2018-02-07 LAB — ECHOCARDIOGRAM COMPLETE: Weight: 3520.31 oz

## 2018-02-07 LAB — MAGNESIUM: Magnesium: 2.1 mg/dL (ref 1.7–2.4)

## 2018-02-07 MED ORDER — NONFORMULARY OR COMPOUNDED ITEM
80.0000 mg | Freq: Every day | Status: DC
  Administered 2018-02-07 – 2018-02-13 (×7): 80 mg via ORAL
  Filled 2018-02-07 (×7): qty 1

## 2018-02-07 MED ORDER — ATORVASTATIN CALCIUM 20 MG PO TABS
20.0000 mg | ORAL_TABLET | Freq: Every day | ORAL | Status: DC
  Administered 2018-02-07 – 2018-02-13 (×7): 20 mg via ORAL
  Filled 2018-02-07 (×7): qty 1

## 2018-02-07 MED ORDER — ACETAMINOPHEN 325 MG PO TABS
650.0000 mg | ORAL_TABLET | Freq: Four times a day (QID) | ORAL | Status: DC | PRN
  Administered 2018-02-10 – 2018-02-13 (×9): 650 mg via ORAL
  Filled 2018-02-07 (×9): qty 2

## 2018-02-07 MED ORDER — LORAZEPAM 2 MG/ML IJ SOLN
0.5000 mg | INTRAMUSCULAR | Status: DC | PRN
  Administered 2018-02-07: 1 mg via INTRAVENOUS
  Administered 2018-02-07: 0.5 mg via INTRAVENOUS
  Administered 2018-02-07: 1 mg via INTRAVENOUS
  Administered 2018-02-10 – 2018-02-12 (×4): 0.5 mg via INTRAVENOUS
  Filled 2018-02-07 (×7): qty 1

## 2018-02-07 MED ORDER — PERFLUTREN LIPID MICROSPHERE
1.0000 mL | INTRAVENOUS | Status: AC | PRN
  Administered 2018-02-07 (×2): 2 mL via INTRAVENOUS
  Filled 2018-02-07: qty 10

## 2018-02-07 MED ORDER — OSIMERTINIB MESYLATE 80 MG PO TABS: 80.0000 mg | ORAL_TABLET | Freq: Every day | ORAL | Status: DC

## 2018-02-07 NOTE — Progress Notes (Signed)
Preliminary notes--Bilateral lower extremities venous duplex exam completed. Positive chronic deep veins thrombosis, involving the right peroneal vein, the left femoral vein, left popliteal vein, left posterior tibial vein, and left peroneal vein.  Chronic superficial vein thrombosis involving the left small saphenous vein.  Hongying Shaquitta Burbridge (RDMS RVT) 02/07/18 2:30 PM

## 2018-02-07 NOTE — Progress Notes (Signed)
ANTICOAGULATION CONSULT NOTE - Follow Up Consult  Pharmacy Consult for Heparin Indication: pulmonary embolus  Allergies  Allergen Reactions  . Symbicort [Budesonide-Formoterol Fumarate] Other (See Comments) and Cough    Chest pain, wheezing   . Amoxicillin Itching and Other (See Comments)    Has patient had a PCN reaction causing immediate rash, facial/tongue/throat swelling, SOB or lightheadedness with hypotension: No Has patient had a PCN reaction causing severe rash involving mucus membranes or skin necrosis: No Has patient had a PCN reaction that required hospitalization No Has patient had a PCN reaction occurring within the last 10 years: No If all of the above answers are "NO", then may proceed with Cephalosporin use.     Patient Measurements: Weight: 220 lb 0.3 oz (99.8 kg) Heparin Dosing Weight:    Vital Signs: Temp: 98.James F (36.9 C) (09/30 1922) Temp Source: Oral (09/30 1922) BP: 122/99 (09/30 2100) Pulse Rate: 92 (09/30 2100)  Labs: Recent Labs    02/06/18 1038  02/06/18 2050  02/07/18 0208 02/07/18 0310 02/07/18 0550 02/07/18 1100 02/07/18 1202 02/07/18 2121  HGB 16.9  --  15.8  --  15.2  --  14.5  --  14.3  --   HCT 52.1*  --  48.5  --  46.2  --  45.2  --  44.6  --   PLT 98*  --  105*  --  99*  --  98*  --  101*  --   APTT  --   --   --   --   --   --  >200*  --   --   --   LABPROT 14.6  --  15.0  --   --   --  15.9*  --   --   --   INR 1.15  --  1.19  --   --   --  1.28  --   --   --   HEPARINUNFRC  --    < >  --    < >  --  0.99*  --  0.83*  --  0.62  CREATININE 1.15  --   --   --   --   --  1.02  --   --   --   TROPONINI 0.52*  --   --   --   --   --   --   --   --   --    < > = values in this interval not displayed.    Estimated Creatinine Clearance: 80.7 mL/min (by C-G formula based on SCr of 1.02 mg/dL).   Assessment:  67 yo James [name] presents with SOB. CT shows submassive bilateral PE with heart strain. Patient is s/p EKOS w/ bilateral  intra-arterial tPA.  - PM Heparin level 0.62 in goal range. RN reports no further oozing from IV sites.  Goal of Therapy:  Heparin level 0.3-0.7 units/ml Monitor platelets by anticoagulation protocol: Yes   Plan:   con't heparin at 1450 units/hr Monitor daily heparin level, CBC, s/s of bleed F/u plans for long term AC  Annaly Skop S. Alford Highland, PharmD, BCPS Clinical Staff Pharmacist Pager [phone]  Eilene Ghazi Stillinger 02/07/2018,10:11 PM

## 2018-02-07 NOTE — Progress Notes (Signed)
PULMONARY / CRITICAL CARE MEDICINE   NAME:  James [name], MRN:  [id number], DOB:  1950/06/30, LOS: 1 ADMISSION DATE:  02/06/2018, CONSULTATION DATE:  02/06/2018 REFERRING MD:  Arnell Sieving, ER, CHIEF COMPLAINT:  Short of breath  BRIEF HISTORY:    67 yo male brought to ER with dyspnea.  Has hx of stage IA NSCLC (adenocarcinoma) s/p resection 09/02/16 and on tagrisso since 11/05/17.  Found to have submassive PE with RV:LV ratio 1.7.  IR consulted for EKOS.  He is followed by [name] in pulmonary office for OSA and asthma.  SIGNIFICANT PAST MEDICAL HISTORY   OSA, Anxiety, depression, chronic diastolic CHF, low back pain, HTN, CAD, Mitral regurg s/p MVR, PAD, , PNA, Positive PPD, DM, GERD  SIGNIFICANT EVENTS:  9/29 Admit, EKOS  STUDIES:   CT angio chest 9/29 >> b/l PE with RV:LV ratio 1.7, mod Rt effusion, 4 mm nodule RUL, 4 mm nodule RLL, 5 mm nodule LUL (reviewed by me)  CULTURES:    ANTIBIOTICS:    LINES/TUBES:  Sheaths for EKOS 9/29 >>  CONSULTANTS:  IR 9/29 PE for EKOS >>  SUBJECTIVE:  Has some wheezing, and chest congestion.  Not having chest pain or abdominal pain.  CONSTITUTIONAL: BP 117/72   Pulse 83   Temp 97.7 F (36.5 C) (Oral)   Resp (!) 25   Wt 99.8 kg   SpO2 95%   BMI 32.49 kg/m   I/O last 3 completed shifts: In: 1499.6 [I.V.:1499.6] Out: 650 [Urine:650]     FiO2 (%):  [40 %] 40 %  PHYSICAL EXAM:  General - alert Eyes - pupils reactive ENT - no sinus tenderness, no stridor Cardiac - regular rate/rhythm, no murmur Chest - b/l expiratory wheeze Abdomen - soft, non tender, + bowel sounds Extremities - no cyanosis, clubbing, or edema Skin - no rashes Neuro - normal strength, moves extremities, follows commands Psych - normal mood and behavior  CXR 9/30 (reviewed by me) >> small Rt effusion   ASSESSMENT AND PLAN    Acute submassive PE with RV strain. - EKOS per IR - continue heparin gtt for now - f/u Echo, doppler legs  Acute asthma  exacerbation. - had trouble with chest discomfort and throat irritation from symbicort as outpt - try on pulmicort and brovana with prn albuterol  Hx of OSA. - CPAP qhs  Hx of chronic diastolic CHF, HTN, CAD, s/p MVR. - continue lipitor - hold ASA, lasix, lopressor, fish oil  Stage 1A NSCLC s/p resection. - continue tagrisso - followed by Dr. Julien Nordmann  Hx of anxiety, depression. - prn ativan  Spinal stenosis. - prn tylenol  SUMMARY OF TODAY'S PLAN:  Complete EKOS.  Continue heparin gtt.  F/u Echo, doppler legs.  Best Practice / Goals of Care / Disposition.   DVT PROPHYLAXIS: heparin gtt SUP: no indicated  NUTRITION: regular diet  MOBILITY: bed rest GOALS OF CARE: full code FAMILY DISCUSSIONS:  No family at bedside   LABS  Glucose No results for input(s): GLUCAP in the last 168 hours.  BMET Recent Labs  Lab 02/06/18 1038 02/07/18 0550  NA 137 139  K 4.4 3.9  CL 107 110  CO2 19* 23  BUN 16 14  CREATININE 1.15 1.02  GLUCOSE 189* 115*    Liver Enzymes Recent Labs  Lab 02/06/18 1038 02/07/18 0550  AST 37 29  ALT 40 27  ALKPHOS 55 40  BILITOT 1.2 1.3*  ALBUMIN 4.0 3.1*    Electrolytes Recent Labs  Lab  02/06/18 1038 02/07/18 0550  CALCIUM 9.0 8.4*  MG  --  2.1    CBC Recent Labs  Lab 02/06/18 2050 02/07/18 0208 02/07/18 0550  WBC 10.1 10.8* 9.1  HGB 15.8 15.2 14.5  HCT 48.5 46.2 45.2  PLT 105* 99* 98*    ABG No results for input(s): PHART, PCO2ART, PO2ART in the last 168 hours.  Coag's Recent Labs  Lab 02/06/18 1038 02/06/18 2050 02/07/18 0550  APTT  --   --  >200*  INR 1.15 1.19 1.28    Sepsis Markers No results for input(s): LATICACIDVEN, PROCALCITON, O2SATVEN in the last 168 hours.  Cardiac Enzymes Recent Labs  Lab 02/06/18 1038  TROPONINI 0.52*   Chesley Mires, MD [place] 02/07/2018, 11:29 AM

## 2018-02-07 NOTE — Progress Notes (Signed)
Referring Physician(s): Dr. Rollene Fare  Supervising Physician: Corrie Mckusick  Patient Status:  [facility] - In-pt  Chief Complaint: Follow-up PE lysis 9/29 by Dr. Barbie Banner  Subjective: 67 year old M with PMH significant for stage IV lung adenocarcinoma on , CHF, CAD, DM, HTN, mitral valve regurgitation with recent replacement, history of DVT, GERD, OSA on CPAP and PVD who presented to [facility] ED on 9/29 with complaints of worsening dyspnea x 2 days. CTA chest showed acute submassive PE with right heart strain - he was started on IV heparin and consult was placed to IR for PE lysis therapy which was performed without complication later that day by Dr. Barbie Banner  Patient reports his breathing has improved somewhat although he did require CPAP overnight which he normally uses at home. He reports some new wheezing and congestion. He states he has been sitting up in bed for several hours because of discomfort with breathing - he denies any other complaints at this time. He reports good appetite; denies fever, chills, dizziness, syncope, chest pain, abdominal pain, nausea or vomiting.  Allergies: Symbicort [budesonide-formoterol fumarate] and Amoxicillin  Medications: Prior to Admission medications   Medication Sig Start Date End Date Taking? Authorizing Provider  acetaminophen (TYLENOL) 325 MG tablet Take 650 mg by mouth every 6 (six) hours as needed for moderate pain or headache.   Yes [provider]  aspirin EC 81 MG tablet Take 1 tablet (81 mg total) by mouth daily. 07/27/16  Yes [name], MD  atorvastatin (LIPITOR) 20 MG tablet TAKE 1 TABLET (20 MG TOTAL) BY MOUTH DAILY. 12/12/17  Yes [name], MD  furosemide (LASIX) 40 MG tablet TAKE 1 TABLET BY MOUTH EVERY DAY Patient taking differently: Take 40 mg by mouth as needed for fluid.  11/03/17  Yes [place], Peter M, MD  metoprolol tartrate (LOPRESSOR) 25 MG tablet TAKE 1/2 TABLET BY MOUTH 2 TIMES DAILY Patient taking differently: Take 12.5  mg by mouth 2 (two) times daily.  06/29/17  Yes [place], Peter M, MD  Omega-3 Fatty Acids (FISH OIL) 500 MG CAPS Take 500 mg by mouth daily.    Yes [provider]  potassium chloride SA (K-DUR,KLOR-CON) 20 MEQ tablet Take 1 tablet (20 mEq total) by mouth daily as needed. With lasix 04/14/17  Yes Meng, [place], PA  sodium chloride (OCEAN) 0.65 % SOLN nasal spray Place 1 spray into both nostrils as needed for congestion.   Yes [provider]  TAGRISSO 80 MG tablet TAKE 1 TABLET (80 MG TOTAL) BY MOUTH DAILY. Patient taking differently: Take 80 mg by mouth daily.  01/19/18  Yes Curt Bears, MD  traMADol (ULTRAM) 50 MG tablet Take 1 tablet (50 mg total) by mouth every 6 (six) hours as needed (may take one or two tablets every six hrs prn). 04/16/17  Yes Rexene Alberts, MD     Vital Signs: BP 117/72   Pulse 83   Temp 97.7 F (36.5 C) (Oral)   Resp (!) 25   Wt 220 lb 0.3 oz (99.8 kg)   SpO2 95%   BMI 32.49 kg/m   Physical Exam  Constitutional: He is oriented to person, place, and time. No distress.  HENT:  Head: Normocephalic.  Cardiovascular: Normal rate, regular rhythm, normal heart sounds and intact distal pulses.  Pulmonary/Chest: Effort normal. He has wheezes (expiratory).  Abdominal: There is no tenderness.  Musculoskeletal:  Right groin insertion site with blood soaked gauze and dried blood on inner thigh,  pubis and right testicle. No active bleeding noted when bandage removed. Sheath removed without complication by IR techs during exam.   Neurological: He is alert and oriented to person, place, and time.  Skin: Skin is warm and dry.  Psychiatric: He has a normal mood and affect. His behavior is normal.  Nursing note and vitals reviewed.   Imaging: Ct Head Wo Contrast  Result Date: 02/06/2018 CLINICAL DATA:  Lung cancer.  Pre tPA. EXAM: CT HEAD WITHOUT CONTRAST TECHNIQUE: Contiguous axial images were obtained from the base of the skull through the vertex  without intravenous contrast. COMPARISON:  10/26/2017 FINDINGS: Brain: No mass effect, midline shift, or acute hemorrhage. Vascular: No hyperdense vessel or unexpected calcification. Skull: Intact. Sinuses/Orbits: No acute finding. Other: Noncontributory IMPRESSION: No acute intracranial pathology. Electronically Signed   By: Marybelle Killings M.D.   On: 02/06/2018 17:03   Ct Angio Chest Pe W And/or Wo Contrast  Result Date: 02/06/2018 CLINICAL DATA:  Shortness of breath.  History of lung carcinoma EXAM: CT ANGIOGRAPHY CHEST WITH CONTRAST TECHNIQUE: Multidetector CT imaging of the chest was performed using the standard protocol during bolus administration of intravenous contrast. Multiplanar CT image reconstructions and MIPs were obtained to evaluate the vascular anatomy. CONTRAST:  100 mL ISOVUE-370 IOPAMIDOL (ISOVUE-370) INJECTION 76% COMPARISON:  Chest CT January 03, 2018; chest radiograph February 06, 2018. FINDINGS: Cardiovascular: There is extensive pulmonary embolism arising from the main pulmonary arteries bilaterally and extending into multiple upper and lower lobe pulmonary artery branches. The right ventricle to left ventricle diameter ratio is 1.7, consistent with right heart strain. There is no thoracic aortic aneurysm or dissection. The visualized great vessels appear unremarkable. No pericardial effusion or pericardial thickening. The main pulmonary outflow tract measures 3.1 cm which is prominent. Mediastinum/Nodes: Thyroid appears unremarkable. There is no appreciable thoracic adenopathy. No esophageal lesions are evident. Lungs/Pleura: There is a moderate pleural effusion on the right with atelectatic change in the right base. There are areas of scarring on the right. There is a small granuloma in the right upper lobe. There is a stable 4 mm nodular opacity in the posterior segment right upper lobe seen on axial slice 50 series 7. There is a stable 4 mm nodular opacity in the superior segment of the  right lower lobe seen on axial slice 51 series 7. There is left base atelectasis. There is no edema or consolidation on the left. There is a stable 5 mm nodular opacity in the posterior segment left upper lobe seen on axial slice 26 series 7. No pleural effusion on the left evident. Upper Abdomen: There is reflux of contrast into the inferior vena cava and hepatic veins. There is a cyst arising in the posterior segment right lobe of the liver measuring 4.4 x 3.9 cm. Visualized upper abdominal structures otherwise appear unremarkable. Musculoskeletal: Patient is status post median sternotomy. There is degenerative change in the thoracic spine with diffuse idiopathic skeletal hyperostosis. There are no appreciable blastic or lytic bone lesions. No chest wall lesions are evident. Review of the MIP images confirms the above findings. IMPRESSION: 1. Positive for acute PE with CT evidence of right heart strain (RV/LV Ratio = 1.7) consistent with at least submassive (intermediate risk) PE. The presence of right heart strain has been associated with an increased risk of morbidity and mortality. Please activate Code PE by paging [phone]. Pulmonary emboli arise in each main pulmonary artery with extension into multiple upper and lower lobe branches. 2.  No thoracic aortic  aneurysm or dissection. 3. Prominence of the main pulmonary outflow tract, a finding felt to be indicative of a degree of pulmonary arterial hypertension. 4. Moderate pleural effusion on the right with right base atelectasis. Areas of scarring and volume loss the right. Left base atelectasis. No edema or consolidation on the left. Subcentimeter nodular opacities remain as noted above, stable. 5.  No demonstrable thoracic adenopathy. 6. Reflux of contrast into the inferior vena cava and hepatic veins, a finding felt to be indicative of increase in right heart pressure. 7.  Diffuse idiopathic skeletal hyperostosis in the thoracic spine. Critical  Value/emergent results were called by telephone at the time of interpretation on 02/06/2018 at 1:25 pm to Dr. Julianne Rice , who verbally acknowledged these results. Electronically Signed   By: Lowella Grip III M.D.   On: 02/06/2018 13:26   Dg Chest Port 1 View  Result Date: 02/07/2018 CLINICAL DATA:  Pulmonary edema EXAM: PORTABLE CHEST 1 VIEW COMPARISON:  02/06/2018 FINDINGS: Prior median sternotomy. Infusion catheters are noted in the pulmonary arteries bilaterally. Mild elevation of the right hemidiaphragm. Small right pleural effusion with right base atelectasis. Left lung clear. IMPRESSION: Small right pleural effusion with right base atelectasis. Electronically Signed   By: Rolm Baptise M.D.   On: 02/07/2018 07:09   Dg Chest Port 1 View  Result Date: 02/06/2018 CLINICAL DATA:  Shortness of breath EXAM: PORTABLE CHEST 1 VIEW COMPARISON:  CT chest 01/03/2018 FINDINGS: Chronic partially loculated right pleural effusion. No focal consolidation. No pneumothorax. No left pleural effusion. Stable cardiomediastinal silhouette. Surgical clips in the right hilum. Prior median sternotomy. The osseous structures are unremarkable. IMPRESSION: 1. No acute cardiopulmonary disease. 2. Stable small loculated right pleural effusion. Electronically Signed   By: Kathreen Devoid   On: 02/06/2018 11:21    Labs:  CBC: Recent Labs    02/06/18 1038 02/06/18 2050 02/07/18 0208 02/07/18 0550  WBC 10.1 10.1 10.8* 9.1  HGB 16.9 15.8 15.2 14.5  HCT 52.1* 48.5 46.2 45.2  PLT 98* 105* 99* 98*    COAGS: Recent Labs    03/22/17 1100 03/24/17 1506  05/14/17 1147 02/06/18 1038 02/06/18 2050 02/07/18 0550  INR 0.95 1.27   < > 2.4 1.15 1.19 1.28  APTT 27 31  --   --   --   --  >200*   < > = values in this interval not displayed.    BMP: Recent Labs    12/06/17 1130 01/03/18 1319 02/06/18 1038 02/07/18 0550  NA 140 141 137 139  K 4.6 4.7 4.4 3.9  CL 108 106 107 110  CO2 25 28 19* 23  GLUCOSE  115* 99 189* 115*  BUN 20 15 16 14   CALCIUM 9.4 9.5 9.0 8.4*  CREATININE 1.18 1.22 1.15 1.02  GFRNONAA >60 59* >60 >60  GFRAA >60 >60 >60 >60    LIVER FUNCTION TESTS: Recent Labs    12/06/17 1130 01/03/18 1319 02/06/18 1038 02/07/18 0550  BILITOT 0.5 0.7 1.2 1.3*  AST 29 33 37 29  ALT 32 55* 40 27  ALKPHOS 66 54 55 40  PROT 7.1 6.8 6.7 5.5*  ALBUMIN 4.1 4.0 4.0 3.1*    Assessment and Plan:  Submassive PE treated with EKOS PE lysis yesterday by Dr. Barbie Banner - patient sitting up in bed upon entry to room, states he had been upright for several hours due to trouble breathing when lying flat. Insertion site dressing fully saturated with blood and dried blood present to  inner thigh, pubis and right testicle - there was no active bleeding noted at the time of exam. Sheath was removed by IR techs during exam today without complication, hemostasis was achieved after holding manual pressure for approximately 5 minutes.   Patient reports improvement in symptoms, H/H 14.5/45.2, INR 1.28, APTT >200 this morning.   Will recheck insertion site in AM. Please call IR with any questions or concerns.  Electronically Signed: Joaquim Nam, PA-C 02/07/2018, 12:23 PM   I spent a total of 15 Minutes at the the patient's bedside AND on the patient's hospital floor or unit, greater than 50% of which was counseling/coordinating care for PE lysis.

## 2018-02-07 NOTE — Progress Notes (Signed)
  Echocardiogram 2D Echocardiogram has been performed.  James [name] 02/07/2018, 5:24 PM

## 2018-02-07 NOTE — Progress Notes (Signed)
Pt stated he would rather not wear CPAP tonight. RT explained to pt of he were to change his mind to let nurse know and we can bring him one. RT will continue to monitor as needed.

## 2018-02-07 NOTE — Progress Notes (Signed)
ANTICOAGULATION CONSULT NOTE - Follow Up Consult  Pharmacy Consult for Heparin Indication: pulmonary embolus  Allergies  Allergen Reactions  . Symbicort [Budesonide-Formoterol Fumarate] Other (See Comments) and Cough    Chest pain, wheezing   . Amoxicillin Itching and Other (See Comments)    Has patient had a PCN reaction causing immediate rash, facial/tongue/throat swelling, SOB or lightheadedness with hypotension: No Has patient had a PCN reaction causing severe rash involving mucus membranes or skin necrosis: No Has patient had a PCN reaction that required hospitalization No Has patient had a PCN reaction occurring within the last 10 years: No If all of the above answers are "NO", then may proceed with Cephalosporin use.     Patient Measurements: Weight: 220 lb (99.8 kg)(Patient reported) Heparin Dosing Weight: ~92 kg  Vital Signs: Temp: 97.5 F (36.4 C) (09/30 0349) Temp Source: Axillary (09/30 0349) BP: 107/77 (09/30 0300) Pulse Rate: 86 (09/30 0300)  Labs: Recent Labs    02/06/18 1038 02/06/18 1842 02/06/18 2050 02/06/18 2051 02/07/18 0208 02/07/18 0310  HGB 16.9  --  15.8  --  15.2  --   HCT 52.1*  --  48.5  --  46.2  --   PLT 98*  --  105*  --  99*  --   LABPROT 14.6  --  15.0  --   --   --   INR 1.15  --  1.19  --   --   --   HEPARINUNFRC  --  <0.10*  --  0.87*  --  0.99*  CREATININE 1.15  --   --   --   --   --   TROPONINI 0.52*  --   --   --   --   --     Estimated Creatinine Clearance: 71.6 mL/min (by C-G formula based on SCr of 1.15 mg/dL).   Assessment: 67 yo M presents with SOB. CT shows submassive PE with heart strain. Pharmacy consulted to manage heparin. Patient is currently in IR for EKOS with bilateral intra-arterial tPA. Initial heparin level on 1550 units/hr is undetectable.   9/30 AM update: heparin level is elevated at 0.99 this AM, no issues per RN, alteplase still infusing  Goal of Therapy:  Heparin level 0.3-0.7 units/ml Monitor  platelets by anticoagulation protocol: Yes   Plan:  Dec heparin to 1600 units/hr Monitor q6h heparin level x 4 while on IA tPA  Monitor daily heparin level, CBC, s/s of bleed   Narda Bonds, PharmD, BCPS Clinical Pharmacist Phone: [phone]

## 2018-02-07 NOTE — Progress Notes (Signed)
[place] Progress Note Patient Name: James [name] DOB: 26-Jun-1950 MRN: [id number]   Date of Service  02/07/2018  HPI/Events of Note  anxiety  eICU Interventions  Ativan prn     Intervention Category Minor Interventions: Agitation / anxiety - evaluation and management  Simonne Maffucci 02/07/2018, 1:41 AM

## 2018-02-07 NOTE — Progress Notes (Signed)
ANTICOAGULATION CONSULT NOTE - Follow Up Consult  Pharmacy Consult for Heparin Indication: pulmonary embolus  Allergies  Allergen Reactions  . Symbicort [Budesonide-Formoterol Fumarate] Other (See Comments) and Cough    Chest pain, wheezing   . Amoxicillin Itching and Other (See Comments)    Has patient had a PCN reaction causing immediate rash, facial/tongue/throat swelling, SOB or lightheadedness with hypotension: No Has patient had a PCN reaction causing severe rash involving mucus membranes or skin necrosis: No Has patient had a PCN reaction that required hospitalization No Has patient had a PCN reaction occurring within the last 10 years: No If all of the above answers are "NO", then may proceed with Cephalosporin use.     Patient Measurements: Weight: 220 lb 0.3 oz (99.8 kg) Heparin Dosing Weight: ~92 kg  Vital Signs: Temp: 97.7 F (36.5 C) (09/30 0700) Temp Source: Oral (09/30 0700) BP: 105/75 (09/30 1415) Pulse Rate: 72 (09/30 1300)  Labs: Recent Labs    02/06/18 1038  02/06/18 2050 02/06/18 2051 02/07/18 0208 02/07/18 0310 02/07/18 0550 02/07/18 1100  HGB 16.9  --  15.8  --  15.2  --  14.5  --   HCT 52.1*  --  48.5  --  46.2  --  45.2  --   PLT 98*  --  105*  --  99*  --  98*  --   APTT  --   --   --   --   --   --  >200*  --   LABPROT 14.6  --  15.0  --   --   --  15.9*  --   INR 1.15  --  1.19  --   --   --  1.28  --   HEPARINUNFRC  --    < >  --  0.87*  --  0.99*  --  0.83*  CREATININE 1.15  --   --   --   --   --  1.02  --   TROPONINI 0.52*  --   --   --   --   --   --   --    < > = values in this interval not displayed.    Estimated Creatinine Clearance: 80.7 mL/min (by C-G formula based on SCr of 1.02 mg/dL).   Assessment: 67 yo M presents with SOB. CT shows submassive bilateral PE with heart strain. Pharmacy consulted to manage heparin. Patient is s/p EKOS w/ bilateral intra-arterial tPA. Therapy completed around 0830 this morning. Sheaths  removed around 1130.   Heparin level is high at 0.83. Hgb 14.5, pltc 98 RN reports oozing at IV sites.  Goal of Therapy:  Heparin level 0.3-0.7 units/ml Monitor platelets by anticoagulation protocol: Yes   Plan:  Decrease IV heparin to 1450 units/hr Check heparin level in 6 hours Monitor daily heparin level, CBC, s/s of bleed  Vertis Kelch, PharmD PGY1 Pharmacy Resident Phone [phone] 02/07/2018       2:35 PM

## 2018-02-07 NOTE — Progress Notes (Signed)
Bilateral PA pressures obtained at bedside at 1100.  Left PA pressure mean 28.  Right PA pressure mean 25.  Pt states he is breathing better.  7 Fr sheaths removed using hemostasis pad and manual pressure at 1110. Hemostasis obtained at 1125.  Tegaderm and gauze applied.

## 2018-02-08 ENCOUNTER — Inpatient Hospital Stay: Payer: Medicare Other | Admitting: Oncology

## 2018-02-08 ENCOUNTER — Inpatient Hospital Stay: Payer: Medicare Other

## 2018-02-08 ENCOUNTER — Encounter (HOSPITAL_COMMUNITY): Payer: Self-pay | Admitting: Interventional Radiology

## 2018-02-08 LAB — COMPREHENSIVE METABOLIC PANEL
ALT: 26 U/L (ref 0–44)
AST: 27 U/L (ref 15–41)
Albumin: 3.4 g/dL — ABNORMAL LOW (ref 3.5–5.0)
Alkaline Phosphatase: 50 U/L (ref 38–126)
Anion gap: 3 — ABNORMAL LOW (ref 5–15)
BUN: 15 mg/dL (ref 8–23)
CO2: 25 mmol/L (ref 22–32)
Calcium: 8.3 mg/dL — ABNORMAL LOW (ref 8.9–10.3)
Chloride: 110 mmol/L (ref 98–111)
Creatinine, Ser: 1.31 mg/dL — ABNORMAL HIGH (ref 0.61–1.24)
GFR calc Af Amer: >60 mL/min (ref >60)
GFR calc non Af Amer: 54 mL/min — ABNORMAL LOW (ref >60)
Glucose, Bld: 99 mg/dL (ref 70–99)
Potassium: 3.8 mmol/L (ref 3.5–5.1)
Sodium: 138 mmol/L (ref 135–145)
Total Bilirubin: 0.8 mg/dL (ref 0.3–1.2)
Total Protein: 5.8 g/dL — ABNORMAL LOW (ref 6.5–8.1)

## 2018-02-08 LAB — HEPARIN LEVEL (UNFRACTIONATED): Heparin Unfractionated: 0.66 IU/mL (ref 0.30–0.70)

## 2018-02-08 LAB — CBC
HCT: 45.4 % (ref 39.0–52.0)
Hemoglobin: 14.7 g/dL (ref 13.0–17.0)
MCH: 29.5 pg (ref 26.0–34.0)
MCHC: 32.4 g/dL (ref 30.0–36.0)
MCV: 91.2 fL (ref 78.0–100.0)
Platelets: 102 10*3/uL — ABNORMAL LOW (ref 150–400)
RBC: 4.98 MIL/uL (ref 4.22–5.81)
RDW: 14.5 % (ref 11.5–15.5)
WBC: 8.1 10*3/uL (ref 4.0–10.5)

## 2018-02-08 MED ORDER — DOCUSATE SODIUM 100 MG PO CAPS
100.0000 mg | ORAL_CAPSULE | Freq: Two times a day (BID) | ORAL | Status: DC
  Administered 2018-02-08 – 2018-02-13 (×10): 100 mg via ORAL
  Filled 2018-02-08 (×11): qty 1

## 2018-02-08 MED ORDER — BISACODYL 10 MG RE SUPP
10.0000 mg | Freq: Once | RECTAL | Status: AC
  Administered 2018-02-08: 10 mg via RECTAL
  Filled 2018-02-08: qty 1

## 2018-02-08 MED ORDER — METOPROLOL TARTRATE 12.5 MG HALF TABLET
12.5000 mg | ORAL_TABLET | Freq: Two times a day (BID) | ORAL | Status: DC
  Administered 2018-02-08 – 2018-02-13 (×11): 12.5 mg via ORAL
  Filled 2018-02-08 (×11): qty 1

## 2018-02-08 MED ORDER — ASPIRIN EC 81 MG PO TBEC
81.0000 mg | DELAYED_RELEASE_TABLET | Freq: Every day | ORAL | Status: DC
  Administered 2018-02-09 – 2018-02-13 (×5): 81 mg via ORAL
  Filled 2018-02-08 (×6): qty 1

## 2018-02-08 MED ORDER — ASPIRIN 81 MG PO CHEW
CHEWABLE_TABLET | ORAL | Status: AC
  Administered 2018-02-08: 81 mg
  Filled 2018-02-08: qty 1

## 2018-02-08 NOTE — Progress Notes (Signed)
Referring Physician(s): Dr. Rollene Fare  Supervising Physician: Sandi Mariscal  Patient Status:  [facility] - In-pt  Chief Complaint: Follow-up PE lysis 9/29 by Dr. Barbie Banner  Subjective:  67 year old M with PMH significant for stage IV lung adenocarcinoma on , CHF, CAD, DM, HTN, mitral valve regurgitation with recent replacement, history of DVT, GERD, OSA on CPAP and PVD who presented to [facility] ED on 9/29 with complaints of worsening dyspnea x 2 days. CTA chest showed acute submassive PE with right heart strain - he was started on IV heparin and consult was placed to IR for PE lysis therapy which was performed without complication later that day by Dr. Barbie Banner. Sheath was removed yesterday by IR techs without complication.  Patient reports he feels good, breathing is improved. He is wondering when he can go home.  Allergies: Symbicort [budesonide-formoterol fumarate] and Amoxicillin  Medications: Prior to Admission medications   Medication Sig Start Date End Date Taking? Authorizing Provider  acetaminophen (TYLENOL) 325 MG tablet Take 650 mg by mouth every 6 (six) hours as needed for moderate pain or headache.   Yes [provider]  aspirin EC 81 MG tablet Take 1 tablet (81 mg total) by mouth daily. 07/27/16  Yes [name], MD  atorvastatin (LIPITOR) 20 MG tablet TAKE 1 TABLET (20 MG TOTAL) BY MOUTH DAILY. 12/12/17  Yes [name], MD  furosemide (LASIX) 40 MG tablet TAKE 1 TABLET BY MOUTH EVERY DAY Patient taking differently: Take 40 mg by mouth as needed for fluid.  11/03/17  Yes [place], Peter M, MD  metoprolol tartrate (LOPRESSOR) 25 MG tablet TAKE 1/2 TABLET BY MOUTH 2 TIMES DAILY Patient taking differently: Take 12.5 mg by mouth 2 (two) times daily.  06/29/17  Yes [place], Peter M, MD  Omega-3 Fatty Acids (FISH OIL) 500 MG CAPS Take 500 mg by mouth daily.    Yes [provider]  potassium chloride SA (K-DUR,KLOR-CON) 20 MEQ tablet Take 1 tablet (20 mEq total) by mouth daily  as needed. With lasix 04/14/17  Yes Meng, [place], PA  sodium chloride (OCEAN) 0.65 % SOLN nasal spray Place 1 spray into both nostrils as needed for congestion.   Yes [provider]  TAGRISSO 80 MG tablet TAKE 1 TABLET (80 MG TOTAL) BY MOUTH DAILY. Patient taking differently: Take 80 mg by mouth daily.  01/19/18  Yes Curt Bears, MD  traMADol (ULTRAM) 50 MG tablet Take 1 tablet (50 mg total) by mouth every 6 (six) hours as needed (may take one or two tablets every six hrs prn). 04/16/17  Yes Rexene Alberts, MD     Vital Signs: BP 110/75   Pulse 70   Temp 98.6 F (37 C) (Oral)   Resp 17   Wt 224 lb 3.3 oz (101.7 kg)   SpO2 98%   BMI 33.11 kg/m   Physical Exam  Constitutional: No distress.  Sleeping comfortably upon entry to room; arouses to verbal cue  Cardiovascular: Normal rate, regular rhythm and normal heart sounds.  Pulmonary/Chest: Effort normal and breath sounds normal.  Abdominal: Soft. There is no tenderness.  Neurological: He is alert.  Skin: Skin is warm and dry. He is not diaphoretic.  Right femoral insertion site clean, dry, intact - no active bleeding; scant dried blood present on dressing. No pain, erythema, warmth or edema.  Psychiatric: He has a normal mood and affect. His behavior is normal.  Nursing note and vitals reviewed.   Imaging: Ct Head  Wo Contrast  Result Date: 02/06/2018 CLINICAL DATA:  Lung cancer.  Pre tPA. EXAM: CT HEAD WITHOUT CONTRAST TECHNIQUE: Contiguous axial images were obtained from the base of the skull through the vertex without intravenous contrast. COMPARISON:  10/26/2017 FINDINGS: Brain: No mass effect, midline shift, or acute hemorrhage. Vascular: No hyperdense vessel or unexpected calcification. Skull: Intact. Sinuses/Orbits: No acute finding. Other: Noncontributory IMPRESSION: No acute intracranial pathology. Electronically Signed   By: Marybelle Killings M.D.   On: 02/06/2018 17:03   Ct Angio Chest Pe W And/or Wo  Contrast  Result Date: 02/06/2018 CLINICAL DATA:  Shortness of breath.  History of lung carcinoma EXAM: CT ANGIOGRAPHY CHEST WITH CONTRAST TECHNIQUE: Multidetector CT imaging of the chest was performed using the standard protocol during bolus administration of intravenous contrast. Multiplanar CT image reconstructions and MIPs were obtained to evaluate the vascular anatomy. CONTRAST:  100 mL ISOVUE-370 IOPAMIDOL (ISOVUE-370) INJECTION 76% COMPARISON:  Chest CT January 03, 2018; chest radiograph February 06, 2018. FINDINGS: Cardiovascular: There is extensive pulmonary embolism arising from the main pulmonary arteries bilaterally and extending into multiple upper and lower lobe pulmonary artery branches. The right ventricle to left ventricle diameter ratio is 1.7, consistent with right heart strain. There is no thoracic aortic aneurysm or dissection. The visualized great vessels appear unremarkable. No pericardial effusion or pericardial thickening. The main pulmonary outflow tract measures 3.1 cm which is prominent. Mediastinum/Nodes: Thyroid appears unremarkable. There is no appreciable thoracic adenopathy. No esophageal lesions are evident. Lungs/Pleura: There is a moderate pleural effusion on the right with atelectatic change in the right base. There are areas of scarring on the right. There is a small granuloma in the right upper lobe. There is a stable 4 mm nodular opacity in the posterior segment right upper lobe seen on axial slice 50 series 7. There is a stable 4 mm nodular opacity in the superior segment of the right lower lobe seen on axial slice 51 series 7. There is left base atelectasis. There is no edema or consolidation on the left. There is a stable 5 mm nodular opacity in the posterior segment left upper lobe seen on axial slice 26 series 7. No pleural effusion on the left evident. Upper Abdomen: There is reflux of contrast into the inferior vena cava and hepatic veins. There is a cyst arising in  the posterior segment right lobe of the liver measuring 4.4 x 3.9 cm. Visualized upper abdominal structures otherwise appear unremarkable. Musculoskeletal: Patient is status post median sternotomy. There is degenerative change in the thoracic spine with diffuse idiopathic skeletal hyperostosis. There are no appreciable blastic or lytic bone lesions. No chest wall lesions are evident. Review of the MIP images confirms the above findings. IMPRESSION: 1. Positive for acute PE with CT evidence of right heart strain (RV/LV Ratio = 1.7) consistent with at least submassive (intermediate risk) PE. The presence of right heart strain has been associated with an increased risk of morbidity and mortality. Please activate Code PE by paging [phone]. Pulmonary emboli arise in each main pulmonary artery with extension into multiple upper and lower lobe branches. 2.  No thoracic aortic aneurysm or dissection. 3. Prominence of the main pulmonary outflow tract, a finding felt to be indicative of a degree of pulmonary arterial hypertension. 4. Moderate pleural effusion on the right with right base atelectasis. Areas of scarring and volume loss the right. Left base atelectasis. No edema or consolidation on the left. Subcentimeter nodular opacities remain as noted above, stable. 5.  No  demonstrable thoracic adenopathy. 6. Reflux of contrast into the inferior vena cava and hepatic veins, a finding felt to be indicative of increase in right heart pressure. 7.  Diffuse idiopathic skeletal hyperostosis in the thoracic spine. Critical Value/emergent results were called by telephone at the time of interpretation on 02/06/2018 at 1:25 pm to Dr. Julianne Rice , who verbally acknowledged these results. Electronically Signed   By: Lowella Grip III M.D.   On: 02/06/2018 13:26   Dg Chest Port 1 View  Result Date: 02/07/2018 CLINICAL DATA:  Pulmonary edema EXAM: PORTABLE CHEST 1 VIEW COMPARISON:  02/06/2018 FINDINGS: Prior median  sternotomy. Infusion catheters are noted in the pulmonary arteries bilaterally. Mild elevation of the right hemidiaphragm. Small right pleural effusion with right base atelectasis. Left lung clear. IMPRESSION: Small right pleural effusion with right base atelectasis. Electronically Signed   By: Rolm Baptise M.D.   On: 02/07/2018 07:09   Dg Chest Port 1 View  Result Date: 02/06/2018 CLINICAL DATA:  Shortness of breath EXAM: PORTABLE CHEST 1 VIEW COMPARISON:  CT chest 01/03/2018 FINDINGS: Chronic partially loculated right pleural effusion. No focal consolidation. No pneumothorax. No left pleural effusion. Stable cardiomediastinal silhouette. Surgical clips in the right hilum. Prior median sternotomy. The osseous structures are unremarkable. IMPRESSION: 1. No acute cardiopulmonary disease. 2. Stable small loculated right pleural effusion. Electronically Signed   By: Kathreen Devoid   On: 02/06/2018 11:21   Ir Jacolyn Reedy F/u Eval Art/ven Final Day (ms)  Result Date: 02/07/2018 INDICATION: 67 year old male with a history of symptomatic pulmonary embolism, status post catheter directed local thrombolysis EXAM: PE LYSIS FOLLOW-UP COMPARISON:  CT 02/06/2018, 02/06/2018 MEDICATIONS: None. ANESTHESIA/SEDATION: None FLUOROSCOPY TIME:  None COMPLICATIONS: None TECHNIQUE: Informed written consent was obtained from the patient after a thorough discussion of the procedural risks, benefits and alternatives. All questions were addressed. Maximal Sterile Barrier Technique was utilized including caps, mask, sterile gowns, sterile gloves, sterile drape, hand hygiene and skin antiseptic. A timeout was performed prior to the initiation of the procedure. Transduction of the left and right pulmonary arterial lysis catheter performed at the bedside. Once the pressures were measured, all catheters were removed. Venous sheath removed from the right common femoral vein approach. Sterile bandages were placed. Patient tolerated the procedure  well and remained hemodynamically stable throughout. No complications were encountered and no significant blood loss. FINDINGS: Left PA pressure: 28 Right PA pressure: 25 Measurements performed 11 a.m. 02/07/2018 IMPRESSION: Status post bedside transduction of PA lytic catheters for repeat pressure measurement, as above. Signed, Dulcy Fanny. Dellia Nims, RPVI [facility] Electronically Signed   By: Corrie Mckusick D.O.   On: 02/07/2018 15:56    Labs:  CBC: Recent Labs    02/07/18 0208 02/07/18 0550 02/07/18 1202 02/08/18 0408  WBC 10.8* 9.1 8.1 8.1  HGB 15.2 14.5 14.3 14.7  HCT 46.2 45.2 44.6 45.4  PLT 99* 98* 101* 102*    COAGS: Recent Labs    03/22/17 1100 03/24/17 1506  05/14/17 1147 02/06/18 1038 02/06/18 2050 02/07/18 0550  INR 0.95 1.27   < > 2.4 1.15 1.19 1.28  APTT 27 31  --   --   --   --  >200*   < > = values in this interval not displayed.    BMP: Recent Labs    01/03/18 1319 02/06/18 1038 02/07/18 0550 02/08/18 0408  NA 141 137 139 138  K 4.7 4.4 3.9 3.8  CL 106 107 110 110  CO2 28 19* 23 25  GLUCOSE 99 189* 115* 99  BUN 15 16 14 15   CALCIUM 9.5 9.0 8.4* 8.3*  CREATININE 1.22 1.15 1.02 1.31*  GFRNONAA 59* >60 >60 54*  GFRAA >60 >60 >60 >60    LIVER FUNCTION TESTS: Recent Labs    01/03/18 1319 02/06/18 1038 02/07/18 0550 02/08/18 0408  BILITOT 0.7 1.2 1.3* 0.8  AST 33 37 29 27  ALT 55* 40 27 26  ALKPHOS 54 55 40 50  PROT 6.8 6.7 5.5* 5.8*  ALBUMIN 4.0 4.0 3.1* 3.4*    Assessment and Plan:  Submassive PE s/p EKOS on 9/29 by Dr. Barbie Banner, sheath removed yesterday without complication. Insertion site clean, dry, intact with no active bleeding today. H/H stable at 14.7/45.4 today.  Patient symptoms improved, hemostasis achieved.   Further management and discharge per admitting service. Please call IR with questions or concerns.  Electronically Signed: Joaquim Nam, PA-C 02/08/2018,  8:07 AM   I spent a total of 15 Minutes at the the patient's bedside AND on the patient's hospital floor or unit, greater than 50% of which was counseling/coordinating care for PE lysis.

## 2018-02-08 NOTE — Progress Notes (Signed)
PULMONARY / CRITICAL CARE MEDICINE   NAME:  James [name], MRN:  [id number], DOB:  May 13, 1950, LOS: 2 ADMISSION DATE:  02/06/2018, CONSULTATION DATE:  02/06/2018 REFERRING MD:  Arnell Sieving, ER, CHIEF COMPLAINT:  Short of breath  BRIEF HISTORY:    67 yo male brought to ER with dyspnea.  Has hx of stage IA NSCLC (adenocarcinoma) s/p resection 09/02/16 and on tagrisso since 11/05/17.  Found to have submassive PE with RV:LV ratio 1.7.  IR consulted for EKOS.  He is followed by [name] in pulmonary office for OSA and asthma.  SIGNIFICANT PAST MEDICAL HISTORY   OSA, Anxiety, depression, chronic diastolic CHF, low back pain, HTN, CAD, Mitral regurg s/p MV repair, PAD, , PNA, Positive PPD, DM, GERD  SIGNIFICANT EVENTS:  09/29 Admit, EKOS 10/01 Transfer to tele  STUDIES:   CT angio chest 9/29 >> b/l PE with RV:LV ratio 1.7, mod Rt effusion, 4 mm nodule RUL, 4 mm nodule RLL, 5 mm nodule LUL (reviewed by me) Doppler legs 9/30 >> chronic DVT Rt peroneal, Lt femoral, Lt popliteal, Lt posterior tibial, Lt peroneal veins Echo 9/30 >> mild LVH, EF 65 to 70%, s/p MV repair, mild RV dilation  CULTURES:    ANTIBIOTICS:    LINES/TUBES:  Sheaths for EKOS 9/29 >> 9/30  CONSULTANTS:  IR 9/29 PE for EKOS >>  SUBJECTIVE:  Breathing better.  Denies chest pain.  Wants to get out of bed.  CONSTITUTIONAL: BP 110/75   Pulse 70   Temp 98.6 F (37 C) (Oral)   Resp 17   Wt 101.7 kg   SpO2 98%   BMI 33.11 kg/m   I/O last 3 completed shifts: In: 2158.2 [I.V.:2158.2] Out: 2151 [Urine:2151]  PHYSICAL EXAM:  General - alert Eyes - pupils reactive ENT - no sinus tenderness, no stridor Cardiac - regular rate/rhythm, no murmur Chest - equal breath sounds b/l, no wheezing or rales Abdomen - soft, non tender, + bowel sounds Extremities - no cyanosis, clubbing, or edema Skin - no rashes Neuro - normal strength, moves extremities, follows commands Psych - normal mood and behavior   ASSESSMENT AND PLAN     Acute submassive PE with RV strain s/p EKOS. Chronic lower extremity DVT. - day 3/5 of heparin gtt - plan to start xarelto after completing 5 days of heparin due to hx of malignancy  Acute asthma exacerbation. - had trouble with chest discomfort and throat irritation from symbicort as outpt - improved 10/01 - continue pulmicort, brovana, prn albuterol  Hx of OSA. - CPAP qhs  Hx of chronic diastolic CHF, HTN, CAD, s/p MV repair. - continue lipitor - resume lopressor, ASA - hold outpt lasix, fish oil for now  Stage 1A NSCLC s/p resection. - continue tagrisso - followed by Dr. Julien Nordmann  Hx of anxiety, depression. - prn ativan  Spinal stenosis. - prn tylenol  SUMMARY OF TODAY'S PLAN:  Transfer to tele 10/01.  To [facility] 10/02 and PCCM off.  Best Practice / Goals of Care / Disposition.   DVT PROPHYLAXIS: heparin gtt SUP: no indicated  NUTRITION: regular diet  MOBILITY: OOB to chair GOALS OF CARE: full code FAMILY DISCUSSIONS:  No family at bedside   LABS  Glucose No results for input(s): GLUCAP in the last 168 hours.  BMET Recent Labs  Lab 02/06/18 1038 02/07/18 0550 02/08/18 0408  NA 137 139 138  K 4.4 3.9 3.8  CL 107 110 110  CO2 19* 23 25  BUN 16 14 15  CREATININE 1.15 1.02 1.31*  GLUCOSE 189* 115* 99    Liver Enzymes Recent Labs  Lab 02/06/18 1038 02/07/18 0550 02/08/18 0408  AST 37 29 27  ALT 40 27 26  ALKPHOS 55 40 50  BILITOT 1.2 1.3* 0.8  ALBUMIN 4.0 3.1* 3.4*    Electrolytes Recent Labs  Lab 02/06/18 1038 02/07/18 0550 02/08/18 0408  CALCIUM 9.0 8.4* 8.3*  MG  --  2.1  --     CBC Recent Labs  Lab 02/07/18 0550 02/07/18 1202 02/08/18 0408  WBC 9.1 8.1 8.1  HGB 14.5 14.3 14.7  HCT 45.2 44.6 45.4  PLT 98* 101* 102*    ABG No results for input(s): PHART, PCO2ART, PO2ART in the last 168 hours.  Coag's Recent Labs  Lab 02/06/18 1038 02/06/18 2050 02/07/18 0550  APTT  --   --  >200*  INR 1.15 1.19 1.28    Sepsis  Markers No results for input(s): LATICACIDVEN, PROCALCITON, O2SATVEN in the last 168 hours.  Cardiac Enzymes Recent Labs  Lab 02/06/18 1038  TROPONINI 0.52*   Chesley Mires, MD [place] 02/08/2018, 9:21 AM

## 2018-02-08 NOTE — Progress Notes (Addendum)
ANTICOAGULATION CONSULT NOTE - Follow Up Consult  Pharmacy Consult for Heparin Indication: pulmonary embolus  Allergies  Allergen Reactions  . Symbicort [Budesonide-Formoterol Fumarate] Other (See Comments) and Cough    Chest pain, wheezing   . Amoxicillin Itching and Other (See Comments)    Has patient had a PCN reaction causing immediate rash, facial/tongue/throat swelling, SOB or lightheadedness with hypotension: No Has patient had a PCN reaction causing severe rash involving mucus membranes or skin necrosis: No Has patient had a PCN reaction that required hospitalization No Has patient had a PCN reaction occurring within the last 10 years: No If all of the above answers are "NO", then may proceed with Cephalosporin use.     Patient Measurements: Weight: 224 lb 3.3 oz (101.7 kg) Heparin Dosing Weight: ~92 kg  Vital Signs: Temp: 98.3 F (James.8 C) (10/01 1127) Temp Source: Oral (10/01 1127) BP: 119/72 (10/01 1300) Pulse Rate: 77 (10/01 1300)  Labs: Recent Labs    02/06/18 1038  02/06/18 2050  02/07/18 0550 02/07/18 1100 02/07/18 1202 02/07/18 2121 02/08/18 0408  HGB 16.9  --  15.8   < > 14.5  --  14.3  --  14.7  HCT 52.1*  --  48.5   < > 45.2  --  44.6  --  45.4  PLT 98*  --  105*   < > 98*  --  101*  --  102*  APTT  --   --   --   --  >200*  --   --   --   --   LABPROT 14.6  --  15.0  --  15.9*  --   --   --   --   INR 1.15  --  1.19  --  1.28  --   --   --   --   HEPARINUNFRC  --    < >  --    < >  --  0.83*  --  0.62 0.66  CREATININE 1.15  --   --   --  1.02  --   --   --  1.31*  TROPONINI 0.52*  --   --   --   --   --   --   --   --    < > = values in this interval not displayed.    Estimated Creatinine Clearance: 63.4 mL/min (A) (by C-G formula based on SCr of 1.31 mg/dL (H)).   Assessment: 67 yo [name] presents with SOB and CT shows submassive bilateral PE with heart strain. Patient is s/p EKOS w/ bilateral intra-arterial tPA and currently on IV heparin  only.  Heparin level 0.66 in goal range but toward the high end of normal.  RN reports bruise by groin, but no further oozing from IV sites; CBC stable.   Goal of Therapy:  Heparin level 0.3-0.7 units/ml Monitor platelets by anticoagulation protocol: Yes   Plan:  Decrease heparin gtt slightly to 1400 units/hr Monitor daily heparin level, CBC, s/s of bleed Plan to switch to Xarelto after heparin [place], Pharmacy Student   Remigio Eisenmenger D. Mina Marble, PharmD, BCPS, [place] 02/08/2018, 1:52 PM

## 2018-02-08 NOTE — Progress Notes (Signed)
Pt refusing CPAP for the night. Rt will continue to monitor as needed. 

## 2018-02-09 ENCOUNTER — Telehealth: Payer: Self-pay | Admitting: Internal Medicine

## 2018-02-09 DIAGNOSIS — C3491 Malignant neoplasm of unspecified part of right bronchus or lung: Secondary | ICD-10-CM

## 2018-02-09 DIAGNOSIS — Z9989 Dependence on other enabling machines and devices: Secondary | ICD-10-CM

## 2018-02-09 DIAGNOSIS — G4733 Obstructive sleep apnea (adult) (pediatric): Secondary | ICD-10-CM

## 2018-02-09 LAB — BASIC METABOLIC PANEL
Anion gap: 6 (ref 5–15)
BUN: 14 mg/dL (ref 8–23)
CO2: 26 mmol/L (ref 22–32)
Calcium: 8.5 mg/dL — ABNORMAL LOW (ref 8.9–10.3)
Chloride: 107 mmol/L (ref 98–111)
Creatinine, Ser: 1.23 mg/dL (ref 0.61–1.24)
GFR calc Af Amer: >60 mL/min (ref >60)
GFR calc non Af Amer: 59 mL/min — ABNORMAL LOW (ref >60)
Glucose, Bld: 92 mg/dL (ref 70–99)
Potassium: 4.3 mmol/L (ref 3.5–5.1)
Sodium: 139 mmol/L (ref 135–145)

## 2018-02-09 LAB — CBC
HCT: 42.3 % (ref 39.0–52.0)
Hemoglobin: 13.8 g/dL (ref 13.0–17.0)
MCH: 29.9 pg (ref 26.0–34.0)
MCHC: 32.6 g/dL (ref 30.0–36.0)
MCV: 91.8 fL (ref 78.0–100.0)
Platelets: 106 10*3/uL — ABNORMAL LOW (ref 150–400)
RBC: 4.61 MIL/uL (ref 4.22–5.81)
RDW: 14.3 % (ref 11.5–15.5)
WBC: 7.1 10*3/uL (ref 4.0–10.5)

## 2018-02-09 LAB — HEPARIN LEVEL (UNFRACTIONATED): Heparin Unfractionated: 0.56 IU/mL (ref 0.30–0.70)

## 2018-02-09 MED ORDER — POLYETHYLENE GLYCOL 3350 17 G PO PACK
17.0000 g | PACK | Freq: Every day | ORAL | Status: DC
  Administered 2018-02-09 – 2018-02-10 (×2): 17 g via ORAL
  Filled 2018-02-09 (×2): qty 1

## 2018-02-09 MED ORDER — BISACODYL 10 MG RE SUPP
10.0000 mg | Freq: Every day | RECTAL | Status: DC | PRN
  Administered 2018-02-10 – 2018-02-13 (×3): 10 mg via RECTAL
  Filled 2018-02-09 (×3): qty 1

## 2018-02-09 NOTE — Progress Notes (Signed)
PROGRESS NOTE   James [name]  SJG:[id number]    DOB: 07/18/1950    DOA: 02/06/2018  PCP: [name], MD   I have briefly reviewed patients previous medical records in [facility].  Brief Narrative:  67 year old married male with PMH of stage 1A NSCLC (Adenocarcinoma) status post resection 09/02/2016 and on Tagrisso since 11/05/2017, OSA, asthma ([name]) anxiety, depression, chronic diastolic CHF, DM 2, HTN, CAD, mitral regurgitation status post MV repair November 2018 ([name] & [name], cardiology), GERD presented to ED on 02/06/2018 with dyspnea, found to have submassive PE with RV: LV ratio 1.7 and admitted to ICU by CCM.  IR consulted for EKOS.  Stabilized and transferred to [facility] on 10/2.   Assessment & Plan:   Active Problems:   OSA on CPAP   Adenocarcinoma of right lung, stage 1 (HCC)   Acute massive pulmonary embolism (HCC)   Acute submassive PE with RV strain s/p EKOS/chronic lower extremity DVT: Day 4/5 off heparin GTT.  As per PCCM, plan to start Xarelto after completing 5 days of heparin due to history of malignancy.  Acute asthma exacerbation: Reportedly has trouble with chest discomfort and throat irritation from Symbicort as outpatient.  Continue Pulmicort, Brovana and PRN albuterol.  Acute exacerbation resolved.  History of OSA: Continue CPAP nightly.  Chronic diastolic CHF: Mild volume overload.  Lasix currently on hold, consider resuming soon or at discharge.  Essential hypertension: Controlled.  Continue metoprolol.  CAD: Continue metoprolol, Lipitor and aspirin.  Status post MV repair: Stable.  Stage 1A NSCLC: Status post resection.  Follows with [name].  Continue Tagrisso.  History of anxiety and depression: Stable.  Spinal stenosis: No pain reported.  Chronic thrombocytopenia: Appears stable.  Stage II-III chronic kidney disease: Stable.   DVT prophylaxis: Currently on IV heparin drip. Code Status: Full Family  Communication: Discussed in detail with patient's spouse at bedside.  Updated care and answered questions. Disposition: DC home pending transitioning from IV heparin to Xarelto.   Consultants:  CCM IR  Procedures:  CT angio chest 9/29 >> b/l PE with RV:LV ratio 1.7, mod Rt effusion, 4 mm nodule RUL, 4 mm nodule RLL, 5 mm nodule LUL (reviewed by me) Doppler legs 9/30 >> chronic DVT Rt peroneal, Lt femoral, Lt popliteal, Lt posterior tibial, Lt peroneal veins Echo 9/30 >> mild LVH, EF 65 to 70%, s/p MV repair, mild RV dilation  IR 9/29 PE for EKOS >> Sheaths for EKOS 9/29 >> 9/30  Antimicrobials:  None.   Subjective: Denies complaints.  Anxious to go home.  No chest pain or dyspnea reported.  Does report constipation, had a good BM 9/30 and a small 1 this morning.  Anxious to mobilize.  ROS: As above, otherwise negative.  Objective:  Vitals:   02/09/18 0500 02/09/18 0600 02/09/18 0700 02/09/18 0800  BP: 114/76 112/61 129/74   Pulse: 62 (!) 59 63 68  Resp: (!) 21 18 (!) 28 16  Temp:    98.2 F (36.8 C)  TempSrc:    Axillary  SpO2: 98% 98% 99% 100%  Weight:        Examination:  General exam: Pleasant middle-aged male, moderately built and obese, sitting up comfortably in chair. Respiratory system: Clear to auscultation. Respiratory effort normal. Cardiovascular system: S1 & S2 heard, RRR. No JVD, murmurs, rubs, gallops or clicks. N trace bilateral leg edema.  Telemetry personally reviewed: Sinus rhythm. Gastrointestinal system: Abdomen is nondistended, soft and nontender. No organomegaly  or masses felt. Normal bowel sounds heard. Central nervous system: Alert and oriented. No focal neurological deficits. Extremities: Symmetric 5 x 5 power. Skin: No rashes, lesions or ulcers Psychiatry: Judgement and insight appear normal. Mood & affect appropriate.     Data Reviewed: I have personally reviewed following labs and imaging studies  CBC: Recent Labs  Lab 02/06/18 1038   02/07/18 0208 02/07/18 0550 02/07/18 1202 02/08/18 0408 02/09/18 0257  WBC 10.1   < > 10.8* 9.1 8.1 8.1 7.1  NEUTROABS 8.4*  --   --  6.6  --   --   --   HGB 16.9   < > 15.2 14.5 14.3 14.7 13.8  HCT 52.1*   < > 46.2 45.2 44.6 45.4 42.3  MCV 91.4   < > 90.8 92.1 91.6 91.2 91.8  PLT 98*   < > 99* 98* 101* 102* 106*   < > = values in this interval not displayed.   Basic Metabolic Panel: Recent Labs  Lab 02/06/18 1038 02/07/18 0550 02/08/18 0408 02/09/18 0257  NA 137 139 138 139  K 4.4 3.9 3.8 4.3  CL 107 110 110 107  CO2 19* 23 25 26   GLUCOSE 189* 115* 99 92  BUN 16 14 15 14   CREATININE 1.15 1.02 1.31* 1.23  CALCIUM 9.0 8.4* 8.3* 8.5*  MG  --  2.1  --   --    Liver Function Tests: Recent Labs  Lab 02/06/18 1038 02/07/18 0550 02/08/18 0408  AST 37 29 27  ALT 40 27 26  ALKPHOS 55 40 50  BILITOT 1.2 1.3* 0.8  PROT 6.7 5.5* 5.8*  ALBUMIN 4.0 3.1* 3.4*   Coagulation Profile: Recent Labs  Lab 02/06/18 1038 02/06/18 2050 02/07/18 0550  INR 1.15 1.19 1.28   Cardiac Enzymes: Recent Labs  Lab 02/06/18 1038  TROPONINI 0.52*   HbA1C: No results for input(s): HGBA1C in the last 72 hours. CBG: No results for input(s): GLUCAP in the last 168 hours.  Recent Results (from the past 240 hour(s))  MRSA PCR Screening     Status: None   Collection Time: 02/06/18  5:28 PM  Result Value Ref Range Status   MRSA by PCR NEGATIVE NEGATIVE Final    Comment:        The GeneXpert MRSA Assay (FDA approved for NASAL specimens only), is one component of a comprehensive MRSA colonization surveillance program. It is not intended to diagnose MRSA infection nor to guide or monitor treatment for MRSA infections. Performed at [facility], [place] [address], [place], [place] [address]          Radiology Studies: Ir Jacolyn Reedy F/u Elizabeth Sauer Art/ven Final Day (ms)  Result Date: 02/07/2018 INDICATION: 67 year old male with a history of symptomatic pulmonary embolism, status post  catheter directed local thrombolysis EXAM: PE LYSIS FOLLOW-UP COMPARISON:  CT 02/06/2018, 02/06/2018 MEDICATIONS: None. ANESTHESIA/SEDATION: None FLUOROSCOPY TIME:  None COMPLICATIONS: None TECHNIQUE: Informed written consent was obtained from the patient after a thorough discussion of the procedural risks, benefits and alternatives. All questions were addressed. Maximal Sterile Barrier Technique was utilized including caps, mask, sterile gowns, sterile gloves, sterile drape, hand hygiene and skin antiseptic. A timeout was performed prior to the initiation of the procedure. Transduction of the left and right pulmonary arterial lysis catheter performed at the bedside. Once the pressures were measured, all catheters were removed. Venous sheath removed from the right common femoral vein approach. Sterile bandages were placed. Patient tolerated the procedure well and  remained hemodynamically stable throughout. No complications were encountered and no significant blood loss. FINDINGS: Left PA pressure: 28 Right PA pressure: 25 Measurements performed 11 a.m. 02/07/2018 IMPRESSION: Status post bedside transduction of PA lytic catheters for repeat pressure measurement, as above. Signed, Dulcy Fanny. Dellia Nims, RPVI [facility] Radiology Electronically Signed   By: Corrie Mckusick D.O.   On: 02/07/2018 15:56        Scheduled Meds: . aspirin EC  81 mg Oral Daily  . atorvastatin  20 mg Oral Daily  . docusate sodium  100 mg Oral BID  . metoprolol tartrate  12.5 mg Oral BID  . Tagrisso (osimertinib) - home med  80 mg Oral Q1200   Continuous Infusions: . heparin 1,400 Units/hr (02/09/18 0800)     LOS: 3 days     Vernell Leep, MD, FACP, [facility]. [facility] Pager [phone]  If 7PM-7AM, please contact night-coverage [email or web address] Password [email or web address] 02/09/2018, 10:03 AM

## 2018-02-09 NOTE — Progress Notes (Signed)
ANTICOAGULATION CONSULT NOTE - Follow Up Consult  Pharmacy Consult for Heparin Indication: pulmonary embolus  Allergies  Allergen Reactions  . Symbicort [Budesonide-Formoterol Fumarate] Other (See Comments) and Cough    Chest pain, wheezing   . Amoxicillin Itching and Other (See Comments)    Has patient had a PCN reaction causing immediate rash, facial/tongue/throat swelling, SOB or lightheadedness with hypotension: No Has patient had a PCN reaction causing severe rash involving mucus membranes or skin necrosis: No Has patient had a PCN reaction that required hospitalization No Has patient had a PCN reaction occurring within the last 10 years: No If all of the above answers are "NO", then may proceed with Cephalosporin use.     Patient Measurements: Weight: 225 lb 1.4 oz (102.1 kg) Heparin Dosing Weight: ~92 kg  Vital Signs: Temp: 98.2 F (36.8 C) (10/02 1100) Temp Source: Oral (10/02 1100) BP: 129/74 (10/02 0700) Pulse Rate: 64 (10/02 1100)  Labs: Recent Labs    02/06/18 2050  02/07/18 0550  02/07/18 1202 02/07/18 2121 02/08/18 0408 02/09/18 0257  HGB 15.8   < > 14.5  --  14.3  --  14.7 13.8  HCT 48.5   < > 45.2  --  44.6  --  45.4 42.3  PLT 105*   < > 98*  --  101*  --  102* 106*  APTT  --   --  >200*  --   --   --   --   --   LABPROT 15.0  --  15.9*  --   --   --   --   --   INR 1.19  --  1.28  --   --   --   --   --   HEPARINUNFRC  --    < >  --    < >  --  0.62 0.66 0.56  CREATININE  --   --  1.02  --   --   --  1.31* 1.23   < > = values in this interval not displayed.    Estimated Creatinine Clearance: 67.7 mL/min (by C-G formula based on SCr of 1.23 mg/dL).   Assessment: 67 yo M presents with SOB and CT shows submassive bilateral PE with heart strain. Patient is s/p EKOS w/ bilateral intra-arterial tPA and currently on IV heparin only.   Heparin level 0.56 in goal range. No concerns for bleeding or complications per RN; CBC stable.   Goal of Therapy:   Heparin level 0.3-0.7 units/ml Monitor platelets by anticoagulation protocol: Yes   Plan:  Continue heparin gtt at 1400 units/hr Monitor daily heparin level, CBC, s/s of bleed Switch to Xarelto (10/4) after completing heparin [place], Pharmacy Student

## 2018-02-09 NOTE — Progress Notes (Signed)
Report called to 2W 20 RN, tx w/ vss, ambulated to new room. Rcvd by RN, NTs. Report confirmed at bs.

## 2018-02-09 NOTE — Telephone Encounter (Signed)
R/s appt per 10/1 sch message - left message for patient with appt date and time.

## 2018-02-10 ENCOUNTER — Encounter (HOSPITAL_COMMUNITY): Payer: Self-pay

## 2018-02-10 ENCOUNTER — Inpatient Hospital Stay (HOSPITAL_COMMUNITY): Payer: Medicare Other

## 2018-02-10 LAB — HEPARIN LEVEL (UNFRACTIONATED): Heparin Unfractionated: 0.4 IU/mL (ref 0.30–0.70)

## 2018-02-10 LAB — BASIC METABOLIC PANEL
Anion gap: 9 (ref 5–15)
BUN: 12 mg/dL (ref 8–23)
CO2: 23 mmol/L (ref 22–32)
Calcium: 8.7 mg/dL — ABNORMAL LOW (ref 8.9–10.3)
Chloride: 104 mmol/L (ref 98–111)
Creatinine, Ser: 1.04 mg/dL (ref 0.61–1.24)
GFR calc Af Amer: >60 mL/min (ref >60)
GFR calc non Af Amer: >60 mL/min (ref >60)
Glucose, Bld: 106 mg/dL — ABNORMAL HIGH (ref 70–99)
Potassium: 3.6 mmol/L (ref 3.5–5.1)
Sodium: 136 mmol/L (ref 135–145)

## 2018-02-10 MED ORDER — POLYETHYLENE GLYCOL 3350 17 G PO PACK
17.0000 g | PACK | Freq: Two times a day (BID) | ORAL | Status: DC
  Administered 2018-02-10 – 2018-02-13 (×5): 17 g via ORAL
  Filled 2018-02-10 (×5): qty 1

## 2018-02-10 NOTE — Care Management Important Message (Signed)
Important Message  Patient Details  Name: CHIBUIKEM THANG MRN: [id number] Date of Birth: [birth year]   Medicare Important Message Given:  Yes    Lazette Estala 02/10/2018, 3:05 PM

## 2018-02-10 NOTE — Progress Notes (Signed)
PROGRESS NOTE   James [name]  PPJ:[id number]    DOB: 12/15/1950    DOA: 02/06/2018  PCP: [name], MD   I have briefly reviewed patients previous medical records in [facility]  Brief Narrative:  67 year old married male with PMH of stage 1A NSCLC (Adenocarcinoma) status post resection 09/02/2016 and on Tagrisso since 11/05/2017, OSA, asthma ([name]) anxiety, depression, chronic diastolic CHF, DM 2, HTN, CAD, mitral regurgitation status post MV repair November 2018 ([name] & [name], cardiology), GERD presented to ED on 02/06/2018 with dyspnea, found to have submassive PE with RV: LV ratio 1.7 and admitted to ICU by CCM.  IR consulted for EKOS.  Stabilized and transferred to [facility] on 10/2.   Assessment & Plan:   Active Problems:   OSA on CPAP   Adenocarcinoma of right lung, stage 1 (HCC)   Acute massive pulmonary embolism (HCC)   Acute submassive PE with RV strain s/p EKOS/chronic lower extremity DVT: Day 5/5 off heparin GTT.  As per PCCM, plan to start Xarelto after completing 5 days of heparin due to history of malignancy.  Consider starting Xarelto on 5/4.  As per patient and spouse's request, discussed with patient's primary Cardiologist [name] who is okay with Xarelto.  Patient briefly was on Coumadin after his mitral valve repair procedure for transient A. fib.  Stable.  Acute asthma exacerbation: Reportedly has trouble with chest discomfort and throat irritation from Symbicort as outpatient.  Continue Pulmicort, Brovana and PRN albuterol.  Acute exacerbation resolved.  Stable.  History of OSA: Continue CPAP nightly.  Chronic diastolic CHF: Mild volume overload.  Lasix currently on hold, resume Lasix.  Essential hypertension: Controlled.  Continue metoprolol.  CAD: Continue metoprolol, Lipitor and aspirin.  Consider stopping aspirin when on Xarelto, will discuss with cardiology prior to discharge.  Status post MV repair: Stable.  Stage 1A NSCLC: Status  post resection.  Follows with [name].  Continue Tagrisso.  History of anxiety and depression: Stable.  Spinal stenosis: No pain reported.  Chronic thrombocytopenia: Appears stable.  Stage II-III chronic kidney disease: Stable.  Constipation: Adjusted bowel regimen.  KUB without acute findings.   DVT prophylaxis: Currently on IV heparin drip. Code Status: Full Family Communication: Discussed in detail with patient's spouse at bedside.  Updated care and answered questions. Disposition: DC home pending transitioning from IV heparin to Xarelto, possibly 10/4.Marland Kitchen   Consultants:  CCM IR  Procedures:  CT angio chest 9/29 >> b/l PE with RV:LV ratio 1.7, mod Rt effusion, 4 mm nodule RUL, 4 mm nodule RLL, 5 mm nodule LUL (reviewed by me) Doppler legs 9/30 >> chronic DVT Rt peroneal, Lt femoral, Lt popliteal, Lt posterior tibial, Lt peroneal veins Echo 9/30 >> mild LVH, EF 65 to 70%, s/p MV repair, mild RV dilation  IR 9/29 PE for EKOS >> Sheaths for EKOS 9/29 >> 9/30  Antimicrobials:  None.   Subjective: No chest pain or dyspnea reported.  Reports he did not sleep well last night.  Some pain right mid calf.  Mild intermittent pain left midabdomen.  Not having good BM, states small and pasty.  No nausea or vomiting.  ROS: As above, otherwise negative.  Objective:  Vitals:   02/09/18 2352 02/10/18 0848 02/10/18 0921 02/10/18 0924  BP: 128/77  (!) 142/82   Pulse: 72 77 67   Resp: 18  18   Temp: 99.3 F (37.4 C)  97.8 F (36.6 C) 97.8 F (36.6 C)  TempSrc: Oral   Oral  SpO2: 100%  99%   Weight: 53 kg     Height:        Examination:  General exam: Pleasant middle-aged male, moderately built and obese, lying comfortably propped up in bed. Respiratory system: Clear to auscultation. Respiratory effort normal.  Stable.  Cardiovascular system: S1 & S2 heard, RRR. No JVD, murmurs, rubs, gallops or clicks. N trace bilateral leg edema.  Telemetry personally reviewed:  Sinus rhythm. Gastrointestinal system: Abdomen is nondistended, soft and nontender. No organomegaly or masses felt. Normal bowel sounds heard.  Old bruise noted over right inguinal region and lower suprapubic area. Central nervous system: Alert and oriented. No focal neurological deficits. Extremities: Symmetric 5 x 5 power.  Old bruising over right upper extremity. Skin: No rashes, lesions or ulcers Psychiatry: Judgement and insight appear normal. Mood & affect appropriate.     Data Reviewed: I have personally reviewed following labs and imaging studies  CBC: Recent Labs  Lab 02/06/18 1038  02/07/18 0208 02/07/18 0550 02/07/18 1202 02/08/18 0408 02/09/18 0257  WBC 10.1   < > 10.8* 9.1 8.1 8.1 7.1  NEUTROABS 8.4*  --   --  6.6  --   --   --   HGB 16.9   < > 15.2 14.5 14.3 14.7 13.8  HCT 52.1*   < > 46.2 45.2 44.6 45.4 42.3  MCV 91.4   < > 90.8 92.1 91.6 91.2 91.8  PLT 98*   < > 99* 98* 101* 102* 106*   < > = values in this interval not displayed.   Basic Metabolic Panel: Recent Labs  Lab 02/06/18 1038 02/07/18 0550 02/08/18 0408 02/09/18 0257 02/10/18 0417  NA 137 139 138 139 136  K 4.4 3.9 3.8 4.3 3.6  CL 107 110 110 107 104  CO2 19* 23 25 26 23   GLUCOSE 189* 115* 99 92 106*  BUN 16 14 15 14 12   CREATININE 1.15 1.02 1.31* 1.23 1.04  CALCIUM 9.0 8.4* 8.3* 8.5* 8.7*  MG  --  2.1  --   --   --    Liver Function Tests: Recent Labs  Lab 02/06/18 1038 02/07/18 0550 02/08/18 0408  AST 37 29 27  ALT 40 27 26  ALKPHOS 55 40 50  BILITOT 1.2 1.3* 0.8  PROT 6.7 5.5* 5.8*  ALBUMIN 4.0 3.1* 3.4*   Coagulation Profile: Recent Labs  Lab 02/06/18 1038 02/06/18 2050 02/07/18 0550  INR 1.15 1.19 1.28   Cardiac Enzymes: Recent Labs  Lab 02/06/18 1038  TROPONINI 0.52*   HbA1C: No results for input(s): HGBA1C in the last 72 hours. CBG: No results for input(s): GLUCAP in the last 168 hours.  Recent Results (from the past 240 hour(s))  MRSA PCR Screening      Status: None   Collection Time: 02/06/18  5:28 PM  Result Value Ref Range Status   MRSA by PCR NEGATIVE NEGATIVE Final    Comment:        The GeneXpert MRSA Assay (FDA approved for NASAL specimens only), is one component of a comprehensive MRSA colonization surveillance program. It is not intended to diagnose MRSA infection nor to guide or monitor treatment for MRSA infections. Performed at [facility], [place] [address], [place], [place] [address]          Radiology Studies: Dg Abd Portable 1v  Result Date: 02/10/2018 CLINICAL DATA:  Abdominal pain and constipation EXAM: PORTABLE ABDOMEN - 1 VIEW COMPARISON:  CT abdomen and pelvis 01/03/2018 FINDINGS: Normal bowel gas pattern. No bowel dilatation or bowel wall thickening. Bones demineralized. LEFT hip prosthesis. No urinary tract calcification. IMPRESSION: Normal bowel gas pattern Electronically Signed   By: Lavonia Dana M.D.   On: 02/10/2018 08:56        Scheduled Meds: . aspirin EC  81 mg Oral Daily  . atorvastatin  20 mg Oral Daily  . docusate sodium  100 mg Oral BID  . metoprolol tartrate  12.5 mg Oral BID  . polyethylene glycol  17 g Oral BID  . Tagrisso (osimertinib) - home med  80 mg Oral Q1200   Continuous Infusions: . heparin 1,400 Units/hr (02/10/18 0348)     LOS: 4 days     Vernell Leep, MD, FACP, [facility]. [facility] Pager [phone]  If 7PM-7AM, please contact night-coverage [email or web address] Password [email or web address] 02/10/2018, 5:07 PM

## 2018-02-10 NOTE — Care Management (Signed)
#    2.   S/W TARYCE   @ [employer] # [phone]  RIVAROXABAN- NONE FORMULARY  1. XARELTO 15 MG BID COVER- YES CO-PAY- ZERO DOLLARS TIER- NO PRIOR APPROVAL- NO  2. XARELTO  20 MG DAILY COVER- YES CO-PAY- ZERO DOLLARS TIER- NO PRIOR APPROVAL- NO  APIXABAN : NONE FORMULARY  ELIQUIS  10 MG  : NONE FORMULARY  3. ELIQUIS  2.5 MG BID COVER- YES CO-PAY- ZERO DOLLARS TIER- NO PRIOR APPROVAL- NO  4. ELIQUIS  5 MG BID COVER- YES CO-PAY- ZERO DOLLARS  TIER- NO PRIOR APPROVAL- NO  PREFERRED PHARMACY : YES   [facility]

## 2018-02-10 NOTE — Progress Notes (Signed)
ANTICOAGULATION CONSULT NOTE - Follow Up Consult  Pharmacy Consult for Heparin Indication: pulmonary embolus  Allergies  Allergen Reactions  . Symbicort [Budesonide-Formoterol Fumarate] Other (See Comments) and Cough    Chest pain, wheezing   . Amoxicillin Itching and Other (See Comments)    Has patient had a PCN reaction causing immediate rash, facial/tongue/throat swelling, SOB or lightheadedness with hypotension: No Has patient had a PCN reaction causing severe rash involving mucus membranes or skin necrosis: No Has patient had a PCN reaction that required hospitalization No Has patient had a PCN reaction occurring within the last 10 years: No If all of the above answers are "NO", then may proceed with Cephalosporin use.     Patient Measurements: Height: 5\' 9"  (175.3 cm) Weight: 116 lb 13.5 oz (53 kg) IBW/kg (Calculated) : 70.7 Heparin Dosing Weight: ~92 kg  Vital Signs: Temp: 97.8 F (36.6 C) (10/03 0924) Temp Source: Oral (10/03 0924) BP: 142/82 (10/03 0921) Pulse Rate: 67 (10/03 0921)  Labs: Recent Labs    02/08/18 0408 02/09/18 0257 02/10/18 0417  HGB 14.7 13.8  --   HCT 45.4 42.3  --   PLT 102* 106*  --   HEPARINUNFRC 0.66 0.56 0.40  CREATININE 1.31* 1.23 1.04    Estimated Creatinine Clearance: 51 mL/min (by C-G formula based on SCr of 1.04 mg/dL).   Assessment: 67 yo M presents with SOB and CT showed submassive bilateral PE with heart strain. Patient is s/p EKOS w/ bilateral intra-arterial tPA and currently on IV heparin only.  Heparin level 0.40, remains therapeutic on heparin drip 1400 units/hr. Day# 5 of IV heparin. Prior reports of  bruise by groin, but no further oozing from IV sites and no other bleeding noted; CBC stable on 10/2.   Goal of Therapy:  Heparin level 0.3-0.7 units/ml Monitor platelets by anticoagulation protocol: Yes   Plan:  Continue IV heparin gtt 1400 units/hr Monitor daily heparin level, CBC, s/s of bleed Plan to switch to  Xarelto after heparin x 5days (switch tomorrow 10/4?)   Nicole Cella, RPh Clinical Pharmacist Please check AMION for all [place] phone numbers After 10:00 PM, call [place] [phone] 02/10/2018, 1:18 PM

## 2018-02-10 NOTE — Care Management Note (Signed)
Case Management Note  Patient Details  Name: James [name] MRN: [id number] Date of Birth: 1950/05/18  Subjective/Objective:   From home with spouse, pta indep, presents with Massive PE on heparin drip, benefit check done for xarelto/eliquis. NCM gave patient the 30 day free trial for xarelto ( the medication of choice per MD note) and informed him of his copay of zero dollars.                   Action/Plan: NCM will follow for transition of care needs.  Expected Discharge Date:                  Expected Discharge Plan:  Home/Self Care  In-House Referral:     Discharge planning Services  CM Consult, Medication Assistance  Post Acute Care Choice:    Choice offered to:     DME Arranged:    DME Agency:     HH Arranged:    HH Agency:     Status of Service:  Completed, signed off  If discussed at [employer] of Stay Meetings, dates discussed:    Additional Comments:  Zenon Mayo, RN 02/10/2018, 12:41 PM

## 2018-02-11 ENCOUNTER — Inpatient Hospital Stay (HOSPITAL_COMMUNITY): Payer: Medicare Other

## 2018-02-11 ENCOUNTER — Encounter (HOSPITAL_COMMUNITY): Payer: Self-pay

## 2018-02-11 DIAGNOSIS — R109 Unspecified abdominal pain: Secondary | ICD-10-CM

## 2018-02-11 DIAGNOSIS — M79609 Pain in unspecified limb: Secondary | ICD-10-CM

## 2018-02-11 DIAGNOSIS — R319 Hematuria, unspecified: Secondary | ICD-10-CM

## 2018-02-11 DIAGNOSIS — M79604 Pain in right leg: Secondary | ICD-10-CM

## 2018-02-11 LAB — URINALYSIS, ROUTINE W REFLEX MICROSCOPIC
Ketones, ur: NEGATIVE mg/dL
RBC / HPF: >50 RBC/hpf — ABNORMAL HIGH (ref 0–5)
WBC, UA: >50 WBC/hpf — ABNORMAL HIGH (ref 0–5)

## 2018-02-11 LAB — CBC
HCT: 39.7 % (ref 39.0–52.0)
HCT: 42.6 % (ref 39.0–52.0)
Hemoglobin: 12.9 g/dL — ABNORMAL LOW (ref 13.0–17.0)
Hemoglobin: 13.7 g/dL (ref 13.0–17.0)
MCH: 29.4 pg (ref 26.0–34.0)
MCH: 29.5 pg (ref 26.0–34.0)
MCHC: 32.5 g/dL (ref 30.0–36.0)
MCHC: 32.2 g/dL (ref 30.0–36.0)
MCV: 90.4 fL (ref 78.0–100.0)
MCV: 91.6 fL (ref 78.0–100.0)
Platelets: 140 10*3/uL — ABNORMAL LOW (ref 150–400)
Platelets: 168 10*3/uL (ref 150–400)
RBC: 4.39 MIL/uL (ref 4.22–5.81)
RBC: 4.65 MIL/uL (ref 4.22–5.81)
RDW: 14.2 % (ref 11.5–15.5)
RDW: 14.4 % (ref 11.5–15.5)
WBC: 6.8 10*3/uL (ref 4.0–10.5)
WBC: 9 10*3/uL (ref 4.0–10.5)

## 2018-02-11 LAB — HEPARIN LEVEL (UNFRACTIONATED): Heparin Unfractionated: 0.46 IU/mL (ref 0.30–0.70)

## 2018-02-11 LAB — URINALYSIS, MICROSCOPIC (REFLEX)
RBC / HPF: >50 RBC/hpf (ref 0–5)
WBC, UA: >50 WBC/hpf (ref 0–5)

## 2018-02-11 LAB — CK: Total CK: 516 U/L — ABNORMAL HIGH (ref 49–397)

## 2018-02-11 MED ORDER — FUROSEMIDE 40 MG PO TABS
40.0000 mg | ORAL_TABLET | Freq: Every day | ORAL | Status: DC
  Filled 2018-02-11 (×2): qty 1

## 2018-02-11 MED ORDER — IOHEXOL 350 MG/ML SOLN
125.0000 mL | Freq: Once | INTRAVENOUS | Status: AC | PRN
  Administered 2018-02-11: 125 mL via INTRAVENOUS

## 2018-02-11 NOTE — Progress Notes (Signed)
Patient refuses CPAP 

## 2018-02-11 NOTE — Progress Notes (Signed)
*  Preliminary Results* Right lower extremity venous duplex completed. Right lower extremity is postive for chronic deep vein thrombosis in the peroneal veins.  There is no evidence of right Baker's cyst.   There is a well-circumscribed anechoic area of the mid posterior calf with posteior acoustic enhancement measuring 2.0cm, suggestive of possible cyst versus unknown etiology. Further imaging may be recommended if clinically warranted.  02/11/2018 12:04 PM  James [name] Dawna Part

## 2018-02-11 NOTE — Progress Notes (Signed)
22 CT called RN, new IV needed. RN attempted IV x1, unsuccessful, pt asking for IV team only. Consult placed. Awaiting IV so pt can have CT done

## 2018-02-11 NOTE — Progress Notes (Signed)
[place] for Heparin Indication: pulmonary embolus  Allergies  Allergen Reactions  . Symbicort [Budesonide-Formoterol Fumarate] Other (See Comments) and Cough    Chest pain, wheezing   . Amoxicillin Itching and Other (See Comments)    Has patient had a PCN reaction causing immediate rash, facial/tongue/throat swelling, SOB or lightheadedness with hypotension: No Has patient had a PCN reaction causing severe rash involving mucus membranes or skin necrosis: No Has patient had a PCN reaction that required hospitalization No Has patient had a PCN reaction occurring within the last 10 years: No If all of the above answers are "NO", then may proceed with Cephalosporin use.     Patient Measurements: Height: 5\' 9"  (175.3 cm) Weight: 222 lb 14.2 oz (101.1 kg) IBW/kg (Calculated) : 70.7 Heparin Dosing Weight: ~92 kg  Vital Signs: Temp: 98.3 F (36.8 C) (10/04 0845) Temp Source: Oral (10/04 0845) BP: 146/86 (10/04 0845) Pulse Rate: 80 (10/04 0845)  Labs: Recent Labs    02/09/18 0257 02/10/18 0417 02/11/18 0434 02/11/18 0930  HGB 13.8  --  12.9*  --   HCT 42.3  --  39.7  --   PLT 106*  --  140*  --   HEPARINUNFRC 0.56 0.40 0.46  --   CREATININE 1.23 1.04  --   --   CKTOTAL  --   --   --  516*    Estimated Creatinine Clearance: 79.7 mL/min (by C-G formula based on SCr of 1.04 mg/dL).   Assessment: 67 yo M presents with SOB and CT showed submassive bilateral PE with heart strain. Patient is s/p EKOS w/ bilateral intra-arterial tPA and currently on IV heparin only.  Heparin level remains therapeutic today at 0.46 on heparin infusion at 1400 units/hr. Hgb slightly lower at 12.9 but stable. No reported bleeding.    Goal of Therapy:  Heparin level 0.3-0.7 units/ml Monitor platelets by anticoagulation protocol: Yes   Plan:  1. Continue IV heparin gtt at 1400 units/hr 2. Monitor daily heparin level, CBC, s/s of bleed 3. Plan to switch  to Xarelto if remains stable    Vincenza Hews, PharmD, BCPS 02/11/2018, 2:00 PM  Please check AMION for all [facility] phone numbers After 10:00 PM, call [place] 02/11/2018, 1:57 PM

## 2018-02-11 NOTE — Progress Notes (Signed)
PROGRESS NOTE   James [name]  SAY:[id number]    DOB: June 08, 1950    DOA: 02/06/2018  PCP: [name], MD   I have briefly reviewed patients previous medical records in [facility].  Brief Narrative:  67 year old married male with PMH of stage 1A NSCLC (Adenocarcinoma) status post resection 09/02/2016 and on Tagrisso since 11/05/2017, OSA, asthma ([name]) anxiety, depression, chronic diastolic CHF, DM 2, HTN, CAD, mitral regurgitation status post MV repair November 2018 ([name] & [name], cardiology), GERD presented to ED on 02/06/2018 with dyspnea, found to have submassive PE with RV: LV ratio 1.7 and admitted to ICU by CCM.  IR consulted for EKOS.  Stabilized and transferred to [facility] on 10/2.   Assessment & Plan:   Active Problems:   OSA on CPAP   Adenocarcinoma of right lung, stage 1 (HCC)   Acute massive pulmonary embolism (HCC)   Acute submassive PE with RV strain s/p EKOS/chronic lower extremity DVT: Day 5/5 off heparin GTT.  As per PCCM, plan to start Xarelto after completing 5 days of heparin due to history of malignancy.  Consider starting Xarelto on 5/4.  As per patient and spouse's request, discussed with patient's primary Cardiologist [name] who is okay with Xarelto.  Patient briefly was on Coumadin after his mitral valve repair procedure for transient A. fib.  Due to ongoing issues as discussed below, for now continue IV heparin and hold off transitioning to Xarelto.  Right lower extremity pain: Had complaints since yesterday but worse overnight.?  Swelling noted over right lateral mid calf.  Lower extremity venous Doppler repeated >positive for chronic DVT in the peroneal veins, no Baker's cyst but shows a well circumcised anechoic area of the mid posterior calf with posterior acoustic enhancement measuring 2 cm, suggestive of possible cyst versus unknown etiology.  I discussed with radiology, PCCM and orthopedics and requested CT of right leg with and without  contrast to rule out hematoma.  Since otherwise stable, Hb has slightly dropped ~ 1.5 g in 48 hours, continue IV heparin for now.  Left flank pain: Yesterday's left mid abdominal pain resolved but reports left flank pain today.  Urine microscopy also shows hematuria.  Low index of suspicion for retroperitoneal hematoma but cannot be definitely excluded.  Again discussed with radiology and PCCM and requested CT of abdomen and pelvis with and without contrast to further evaluate.  Since otherwise stable, continue IV heparin for now.  Hematuria: Management as above.  Acute asthma exacerbation: Reportedly has trouble with chest discomfort and throat irritation from Symbicort as outpatient.  Continue Pulmicort, Brovana and PRN albuterol.  Acute exacerbation resolved.  Stable.  History of OSA: Continue CPAP nightly.  Chronic diastolic CHF: Mild volume overload.  Lasix currently on hold, resumed Lasix.  Essential hypertension: Controlled.  Continue metoprolol.  CAD: Continue metoprolol, Lipitor and aspirin.  Consider stopping aspirin when on Xarelto, will discuss with cardiology prior to discharge.  Status post MV repair: Stable.  Stage 1A NSCLC: Status post resection.  Follows with [name].  Continue Tagrisso.  History of anxiety and depression: Stable.  Spinal stenosis: No pain reported.  Chronic thrombocytopenia: Appears stable.  Stage II-III chronic kidney disease: Stable.  Constipation: Adjusted bowel regimen.  KUB without acute findings.  Had couple of small BMs on 10/3.   DVT prophylaxis: Currently on IV heparin drip. Code Status: Full Family Communication: Discussed in detail with patient's spouse at bedside.  Updated care and answered questions.  Disposition: DC home pending clinical improvement and further evaluation as indicated above.   Consultants:  CCM IR  Procedures:  CT angio chest 9/29 >> b/l PE with RV:LV ratio 1.7, mod Rt effusion, 4 mm nodule RUL, 4  mm nodule RLL, 5 mm nodule LUL (reviewed by me) Doppler legs 9/30 >> chronic DVT Rt peroneal, Lt femoral, Lt popliteal, Lt posterior tibial, Lt peroneal veins Echo 9/30 >> mild LVH, EF 65 to 70%, s/p MV repair, mild RV dilation  IR 9/29 PE for EKOS >> Sheaths for EKOS 9/29 >> 9/30  Antimicrobials:  None.   Subjective: Reports worsening pain right lateral mid calf, unable to sleep because of that and left flank pain.  Urine dark.  No dysuria.  No chest pain or dyspnea.  Had 2 small BMs yesterday.  Anxious to go home. Patient was interviewed and examined with spouse and RN at bedside.  I spent a long time discussing ongoing care and answering questions.  ROS: As above, otherwise negative.  Objective:  Vitals:   02/10/18 2341 02/11/18 0842 02/11/18 0845 02/11/18 1300  BP: 121/77 (!) 146/86 (!) 146/86   Pulse: 66 85 80   Resp: (!) 22  20   Temp: 98.2 F (36.8 C) 98.3 F (36.8 C) 98.3 F (36.8 C)   TempSrc: Oral Oral Oral   SpO2: 99% 97% 97%   Weight:    101.1 kg  Height:    5\' 9"  (1.753 m)    Examination:  General exam: Pleasant middle-aged male, moderately built and obese, sitting up comfortably in chair. Respiratory system: Clear to auscultation. Respiratory effort normal.  Stable. Cardiovascular system: S1 & S2 heard, RRR. No JVD, murmurs, rubs, gallops or clicks. Trace bilateral leg edema.  Gastrointestinal system: Abdomen is nondistended, soft and nontender. No organomegaly or masses felt. Normal bowel sounds heard.  Old bruise noted over right inguinal region and lower suprapubic area > no worsening.  No L flank tenderness or external bruising at the flank.   Central nervous system: Alert and oriented. No focal neurological deficits. Extremities: Symmetric 5 x 5 power.  Old bruising over right upper extremity > no worsening.  Approximately golf ball size area of tenderness, mild swelling right lateral mid calf without erythema, increased warmth or fluctuation.  No external  bruising. Skin: No rashes, lesions or ulcers Psychiatry: Judgement and insight appear normal. Mood & affect appropriate.     Data Reviewed: I have personally reviewed following labs and imaging studies  CBC: Recent Labs  Lab 02/06/18 1038  02/07/18 0550 02/07/18 1202 02/08/18 0408 02/09/18 0257 02/11/18 0434  WBC 10.1   < > 9.1 8.1 8.1 7.1 6.8  NEUTROABS 8.4*  --  6.6  --   --   --   --   HGB 16.9   < > 14.5 14.3 14.7 13.8 12.9*  HCT 52.1*   < > 45.2 44.6 45.4 42.3 39.7  MCV 91.4   < > 92.1 91.6 91.2 91.8 90.4  PLT 98*   < > 98* 101* 102* 106* 140*   < > = values in this interval not displayed.   Basic Metabolic Panel: Recent Labs  Lab 02/06/18 1038 02/07/18 0550 02/08/18 0408 02/09/18 0257 02/10/18 0417  NA 137 139 138 139 136  K 4.4 3.9 3.8 4.3 3.6  CL 107 110 110 107 104  CO2 19* 23 25 26 23   GLUCOSE 189* 115* 99 92 106*  BUN 16 14 15 14 12   CREATININE 1.15 1.02  1.31* 1.23 1.04  CALCIUM 9.0 8.4* 8.3* 8.5* 8.7*  MG  --  2.1  --   --   --    Liver Function Tests: Recent Labs  Lab 02/06/18 1038 02/07/18 0550 02/08/18 0408  AST 37 29 27  ALT 40 27 26  ALKPHOS 55 40 50  BILITOT 1.2 1.3* 0.8  PROT 6.7 5.5* 5.8*  ALBUMIN 4.0 3.1* 3.4*   Coagulation Profile: Recent Labs  Lab 02/06/18 1038 02/06/18 2050 02/07/18 0550  INR 1.15 1.19 1.28   Cardiac Enzymes: Recent Labs  Lab 02/06/18 1038 02/11/18 0930  CKTOTAL  --  516*  TROPONINI 0.52*  --    HbA1C: No results for input(s): HGBA1C in the last 72 hours. CBG: No results for input(s): GLUCAP in the last 168 hours.  Recent Results (from the past 240 hour(s))  MRSA PCR Screening     Status: None   Collection Time: 02/06/18  5:28 PM  Result Value Ref Range Status   MRSA by PCR NEGATIVE NEGATIVE Final    Comment:        The GeneXpert MRSA Assay (FDA approved for NASAL specimens only), is one component of a comprehensive MRSA colonization surveillance program. It is not intended to diagnose  MRSA infection nor to guide or monitor treatment for MRSA infections. Performed at [facility], [place] [address] [place], [place] [address]          Radiology Studies: Dg Abd Portable 1v  Result Date: 02/10/2018 CLINICAL DATA:  Abdominal pain and constipation EXAM: PORTABLE ABDOMEN - 1 VIEW COMPARISON:  CT abdomen and pelvis 01/03/2018 FINDINGS: Normal bowel gas pattern. No bowel dilatation or bowel wall thickening. Bones demineralized. LEFT hip prosthesis. No urinary tract calcification. IMPRESSION: Normal bowel gas pattern Electronically Signed   By: Lavonia Dana M.D.   On: 02/10/2018 08:56        Scheduled Meds: . aspirin EC  81 mg Oral Daily  . atorvastatin  20 mg Oral Daily  . docusate sodium  100 mg Oral BID  . metoprolol tartrate  12.5 mg Oral BID  . polyethylene glycol  17 g Oral BID  . Tagrisso (osimertinib) - home med  80 mg Oral Q1200   Continuous Infusions: . heparin 1,400 Units/hr (02/11/18 1714)     LOS: 5 days     Vernell Leep, MD, FACP, [facility]. [facility] Pager [phone]  If 7PM-7AM, please contact night-coverage [email or web address] Password [facility] 02/11/2018, 6:26 PM

## 2018-02-11 NOTE — Progress Notes (Signed)
Spoke with Dr. Algis Liming regarding recent CT studies. Asked to review studies for both CT of abdomen and pelvis and RLE when interpretations available. Spoke with bedside RN, pt is currently stable and asking when he will be able to go home. Reviewed with the nurse current treatment plan, will continue to assess pt throughout the night  Left flank pain -CT of the abdomen shows likely hemorrhage into multiple central sinus cysts in the left kidney. Despite this we will continue with the heparin gtt for now as pt has a submassive PE with RV strain. Transition to Xarelto in the next 24 hours. - Monitor H/H closely.  Right lower extremity    -CT of the RLE does not suggest a hematoma. Continue with current treatment plan.     Lovey Newcomer, NP [facility]. [phone]  [phone]

## 2018-02-12 ENCOUNTER — Encounter (HOSPITAL_COMMUNITY): Payer: Self-pay | Admitting: Family

## 2018-02-12 LAB — BASIC METABOLIC PANEL
Anion gap: 8 (ref 5–15)
BUN: 11 mg/dL (ref 8–23)
CO2: 27 mmol/L (ref 22–32)
Calcium: 9.1 mg/dL (ref 8.9–10.3)
Chloride: 105 mmol/L (ref 98–111)
Creatinine, Ser: 1.32 mg/dL — ABNORMAL HIGH (ref 0.61–1.24)
GFR calc Af Amer: >60 mL/min (ref >60)
GFR calc non Af Amer: 54 mL/min — ABNORMAL LOW (ref >60)
Glucose, Bld: 110 mg/dL — ABNORMAL HIGH (ref 70–99)
Potassium: 4.3 mmol/L (ref 3.5–5.1)
Sodium: 140 mmol/L (ref 135–145)

## 2018-02-12 LAB — CBC
HCT: 38.9 % — ABNORMAL LOW (ref 39.0–52.0)
Hemoglobin: 12.4 g/dL — ABNORMAL LOW (ref 13.0–17.0)
MCH: 29.2 pg (ref 26.0–34.0)
MCHC: 31.9 g/dL (ref 30.0–36.0)
MCV: 91.7 fL (ref 78.0–100.0)
Platelets: 164 10*3/uL (ref 150–400)
RBC: 4.24 MIL/uL (ref 4.22–5.81)
RDW: 14.5 % (ref 11.5–15.5)
WBC: 8.4 10*3/uL (ref 4.0–10.5)

## 2018-02-12 LAB — HEPARIN LEVEL (UNFRACTIONATED): Heparin Unfractionated: 0.48 IU/mL (ref 0.30–0.70)

## 2018-02-12 MED ORDER — RIVAROXABAN 20 MG PO TABS: 20.0000 mg | ORAL_TABLET | Freq: Every day | ORAL | Status: DC

## 2018-02-12 MED ORDER — RIVAROXABAN 15 MG PO TABS
15.0000 mg | ORAL_TABLET | Freq: Two times a day (BID) | ORAL | Status: DC
  Administered 2018-02-12 – 2018-02-13 (×2): 15 mg via ORAL
  Filled 2018-02-12 (×3): qty 1

## 2018-02-12 MED ORDER — TRAMADOL HCL 50 MG PO TABS
50.0000 mg | ORAL_TABLET | Freq: Four times a day (QID) | ORAL | Status: DC | PRN
  Administered 2018-02-12: 50 mg via ORAL
  Filled 2018-02-12 (×3): qty 1

## 2018-02-12 NOTE — Progress Notes (Signed)
[place] for Heparin > Xarelto Indication: pulmonary embolus  Allergies  Allergen Reactions  . Symbicort [Budesonide-Formoterol Fumarate] Other (See Comments) and Cough    Chest pain, wheezing   . Amoxicillin Itching and Other (See Comments)    Has patient had a PCN reaction causing immediate rash, facial/tongue/throat swelling, SOB or lightheadedness with hypotension: No Has patient had a PCN reaction causing severe rash involving mucus membranes or skin necrosis: No Has patient had a PCN reaction that required hospitalization No Has patient had a PCN reaction occurring within the last 10 years: No If all of the above answers are "NO", then may proceed with Cephalosporin use.     Patient Measurements: Height: 5\' 9"  (175.3 cm) Weight: 222 lb 7.1 oz (100.9 kg) IBW/kg (Calculated) : 70.7 Heparin Dosing Weight: ~92 kg  Vital Signs: Temp: 98.2 F (36.8 C) (10/05 0815) Temp Source: Oral (10/05 0815) BP: 137/80 (10/05 0815) Pulse Rate: 79 (10/05 0815)  Labs: Recent Labs    02/10/18 0417  02/11/18 0434 02/11/18 0930 02/11/18 2004 02/12/18 0313  HGB  --    < > 12.9*  --  13.7 12.4*  HCT  --   --  39.7  --  42.6 38.9*  PLT  --   --  140*  --  168 164  HEPARINUNFRC 0.40  --  0.46  --   --  0.48  CREATININE 1.04  --   --   --   --  1.32*  CKTOTAL  --   --   --  516*  --   --    < > = values in this interval not displayed.    Estimated Creatinine Clearance: 62.7 mL/min (A) (by C-G formula based on SCr of 1.32 mg/dL (H)).   Assessment: 67 yo M presents with SOB and CT showed submassive bilateral PE with heart strain. Patient is s/p EKOS w/ bilateral intra-arterial tPA and currently on IV heparin only.  Pharmacy consulted to transition to Xarelto from heparin.   Goal of Therapy:  Heparin level 0.3-0.7 units/ml Monitor platelets by anticoagulation protocol: Yes   Plan:  -Stop heparin -Xarelto 15mg  BID x21 days then 20mg   daily  Arrie Senate, PharmD, BCPS Clinical Pharmacist [phone] Please check AMION for all [place] numbers 02/12/2018

## 2018-02-12 NOTE — Progress Notes (Signed)
Pt refusing CPAP for the night. Rt will continue to monitor as needed. 

## 2018-02-12 NOTE — Progress Notes (Signed)
PROGRESS NOTE   James [name]  MEQ:[id number]    DOB: November 13, 1950    DOA: 02/06/2018  PCP: [name], MD   I have briefly reviewed patients previous medical records in [facility].  Brief Narrative:  67 year old married male with PMH of stage 1A NSCLC (Adenocarcinoma) status post resection 09/02/2016 and on Tagrisso since 11/05/2017, OSA, asthma ([name]) anxiety, depression, chronic diastolic CHF, DM 2, HTN, CAD, mitral regurgitation status post MV repair November 2018 ([name] & [name], cardiology), GERD presented to ED on 02/06/2018 with dyspnea, found to have submassive PE with RV: LV ratio 1.7 and admitted to ICU by CCM.  IR consulted for EKOS.  Stabilized and transferred to [facility] on 10/2.  Hospital course complicated by left flank pain, hematuria and bleeding from left renal cyst, right leg pain and swelling of yet undetermined etiology.  Urology and orthopedics consulted.   Assessment & Plan:   Active Problems:   OSA on CPAP   Adenocarcinoma of right lung, stage 1 (HCC)   Acute massive pulmonary embolism (HCC)   Acute submassive PE with RV strain s/p EKOS/chronic lower extremity DVT: Day 6/5 off heparin GTT. The original plan was to transition to Xarelto after 5 days of IV heparin.  However he developed left flank pain, hematuria and right leg painful swelling of undetermined etiology which were being worked up.  Hence IV heparin was continued.  Both Urology and OIrthopedics have evaluated today and have cleared for transitioning to [place].  Will start Xarelto 10/5 after MRI of his right leg.  This will give [place] a chance to monitor him while in Xarelto regarding his hematuria.  Right lower extremity pain: Had complaints since 10/3 and progressively worsened.?  Swelling noted over right lateral mid calf.  Lower extremity venous Doppler repeated >positive for chronic DVT in the peroneal veins, no Baker's cyst but shows a well circumcised anechoic area of the mid posterior  calf with posterior acoustic enhancement measuring 2 cm, suggestive of possible cyst versus unknown etiology.  CT of right leg with contrast: Ill-defined low-attenuation focus in the right soleus not suggestive of hematoma and could be due to ganglion cyst, focal inflammatory change or myxoma.  Persisting/worsening pain today.  Orthopedics consulted/discussed with Dr. Lyla Glassing, recommends MRI with contrast, follow.  Elevate limb, warm compress.  Left flank pain/gross hematuria: Left flank pain and gross hematuria since 10/3.  CT abdomen and pelvis with contrast 10/4: Suggestive of hemorrhage into renal cysts.  I discussed with and consulted urology.  Okay to transition to oral anticoagulation and outpatient follow-up with them for further work-up.  Please see their detailed note 10/5.  Acute asthma exacerbation: Reportedly has trouble with chest discomfort and throat irritation from Symbicort as outpatient.  Continue Pulmicort, Brovana and PRN albuterol.  Acute exacerbation resolved.  Stable.  History of OSA: Continue CPAP nightly.  Chronic diastolic CHF: Euvolemic.  Essential hypertension: Controlled.  Continue metoprolol.  CAD: Continue metoprolol, Lipitor and aspirin.  Consider stopping aspirin when on Xarelto, will discuss with cardiology prior to discharge.  Status post MV repair: Stable.  Stage 1A NSCLC: Status post resection.  Follows with [name].  Continue Tagrisso.  History of anxiety and depression: Stable.  Spinal stenosis: No pain reported.  Chronic thrombocytopenia: Resolved today.  Acute on stage II-III chronic kidney disease: Creatinine has increased from 1.04-1.32.  Likely related to hematuria and transient obstruction.  Also received contrast 10/4.  Encouraged oral fluid intake.  Follow BMP  in a.m.  Constipation: Adjusted bowel regimen.  KUB without acute findings.  Had couple of small BMs on 10/3.   DVT prophylaxis: Currently on IV heparin drip. Code  Status: Full Family Communication: I discussed in detail with patient and spouse at bedside along with RN in room.  Updated care and answered questions. Disposition: DC home pending clinical improvement and further evaluation as indicated above.   Consultants:  CCM IR  Procedures:  CT angio chest 9/29 >> b/l PE with RV:LV ratio 1.7, mod Rt effusion, 4 mm nodule RUL, 4 mm nodule RLL, 5 mm nodule LUL (reviewed by me) Doppler legs 9/30 >> chronic DVT Rt peroneal, Lt femoral, Lt popliteal, Lt posterior tibial, Lt peroneal veins Echo 9/30 >> mild LVH, EF 65 to 70%, s/p MV repair, mild RV dilation  IR 9/29 PE for EKOS >> Sheaths for EKOS 9/29 >> 9/30  Antimicrobials:  None.   Subjective: States that his right cough pain is worse, difficulty weightbearing.  Left flank pain unchanged.  Urine more darker than yesterday.  No chest pain or dyspnea.  ROS: As above, otherwise negative.  Objective:  Vitals:   02/11/18 2118 02/11/18 2319 02/12/18 0644 02/12/18 0815  BP: 125/82 (!) 155/101  137/80  Pulse: 72 87  79  Resp:  (!) 21  18  Temp:  97.6 F (36.4 C)  98.2 F (36.8 C)  TempSrc:  Oral  Oral  SpO2:  99%  97%  Weight:   100.9 kg   Height:        Examination:  General exam: Pleasant middle-aged male, moderately built and obese, sitting up comfortably in chair. Respiratory system: Clear to auscultation. Respiratory effort normal.  Stable Cardiovascular system: S1 & S2 heard, RRR. No JVD, murmurs, rubs, gallops or clicks. Trace bilateral leg edema.  Telemetry personally reviewed: Sinus rhythm. Gastrointestinal system: Abdomen is nondistended, soft and nontender. No organomegaly or masses felt. Normal bowel sounds heard.  Old bruise noted over right inguinal region and lower suprapubic area > slowly resolving.  No L flank tenderness or external bruising at the flank.   Central nervous system: Alert and oriented. No focal neurological deficits.  Able. Extremities: Symmetric 5 x 5  power.  Old bruising over right upper extremity > slowly resolving.  Approximately golf ball size area of tenderness, mild swelling right lateral mid calf without erythema, increased warmth or fluctuation, no change compared to yesterday.  No external bruising. Skin: No rashes, lesions or ulcers Psychiatry: Judgement and insight appear normal. Mood & affect appropriate.     Data Reviewed: I have personally reviewed following labs and imaging studies  CBC: Recent Labs  Lab 02/06/18 1038  02/07/18 0550  02/08/18 0408 02/09/18 0257 02/11/18 0434 02/11/18 2004 02/12/18 0313  WBC 10.1   < > 9.1   < > 8.1 7.1 6.8 9.0 8.4  NEUTROABS 8.4*  --  6.6  --   --   --   --   --   --   HGB 16.9   < > 14.5   < > 14.7 13.8 12.9* 13.7 12.4*  HCT 52.1*   < > 45.2   < > 45.4 42.3 39.7 42.6 38.9*  MCV 91.4   < > 92.1   < > 91.2 91.8 90.4 91.6 91.7  PLT 98*   < > 98*   < > 102* 106* 140* 168 164   < > = values in this interval not displayed.   Basic Metabolic Panel: Recent Labs  Lab 02/07/18 0550 02/08/18 0408 02/09/18 0257 02/10/18 0417 02/12/18 0313  NA 139 138 139 136 140  K 3.9 3.8 4.3 3.6 4.3  CL 110 110 107 104 105  CO2 23 25 26 23 27   GLUCOSE 115* 99 92 106* 110*  BUN 14 15 14 12 11   CREATININE 1.02 1.31* 1.23 1.04 1.32*  CALCIUM 8.4* 8.3* 8.5* 8.7* 9.1  MG 2.1  --   --   --   --    Liver Function Tests: Recent Labs  Lab 02/06/18 1038 02/07/18 0550 02/08/18 0408  AST 37 29 27  ALT 40 27 26  ALKPHOS 55 40 50  BILITOT 1.2 1.3* 0.8  PROT 6.7 5.5* 5.8*  ALBUMIN 4.0 3.1* 3.4*   Coagulation Profile: Recent Labs  Lab 02/06/18 1038 02/06/18 2050 02/07/18 0550  INR 1.15 1.19 1.28   Cardiac Enzymes: Recent Labs  Lab 02/06/18 1038 02/11/18 0930  CKTOTAL  --  516*  TROPONINI 0.52*  --    HbA1C: No results for input(s): HGBA1C in the last 72 hours. CBG: No results for input(s): GLUCAP in the last 168 hours.  Recent Results (from the past 240 hour(s))  MRSA PCR  Screening     Status: None   Collection Time: 02/06/18  5:28 PM  Result Value Ref Range Status   MRSA by PCR NEGATIVE NEGATIVE Final    Comment:        The GeneXpert MRSA Assay (FDA approved for NASAL specimens only), is one component of a comprehensive MRSA colonization surveillance program. It is not intended to diagnose MRSA infection nor to guide or monitor treatment for MRSA infections. Performed at [facility], [place] [address] [place], [place] [address]          Radiology Studies: Ct Abdomen Pelvis W Wo Contrast  Result Date: 02/11/2018 CLINICAL DATA:  Hematuria.  History of lung cancer and PE. EXAM: CT ABDOMEN AND PELVIS WITHOUT AND WITH CONTRAST TECHNIQUE: Multidetector CT imaging of the abdomen and pelvis was performed following the standard protocol before and following the bolus administration of intravenous contrast. CONTRAST:  160mL OMNIPAQUE IOHEXOL 350 MG/ML SOLN COMPARISON:  01/03/2018 FINDINGS: Lower chest: Small right pleural effusion. Hepatobiliary: 4.6 cm cyst identified posterior right liver. 2.3 cm apparent cyst identified in the lateral segment left liver. There is no evidence for gallstones, gallbladder wall thickening, or pericholecystic fluid. No intrahepatic or extrahepatic biliary dilation. Pancreas: No focal mass lesion. No dilatation of the main duct. No intraparenchymal cyst. No peripancreatic edema. Spleen: No splenomegaly. No focal mass lesion. Adrenals/Urinary Tract: No adrenal nodule or mass. Right kidney unremarkable. Right ureter unremarkable. Previous CT showed multiple central sinus cysts in the left kidney. Many of these central sinus cysts have increased substantially in attenuation in the interval compatible with hemorrhage there is left perinephric edema. No left hydroureter although there is left periureteric edema. Stomach/Bowel: Stomach is nondistended. No gastric wall thickening. No evidence of outlet obstruction. Duodenum is normally  positioned as is the ligament of Treitz. No small bowel wall thickening. No small bowel dilatation. The terminal ileum is normal. The appendix is not visualized, but there is no edema or inflammation in the region of the cecum. No gross colonic mass. No colonic wall thickening. No substantial diverticular change. Vascular/Lymphatic: There is abdominal aortic atherosclerosis without aneurysm. There is no gastrohepatic or hepatoduodenal ligament lymphadenopathy. No intraperitoneal or retroperitoneal lymphadenopathy. No pelvic sidewall lymphadenopathy. Reproductive: Prostate gland is enlarged. Other: No intraperitoneal free fluid. Musculoskeletal: Stranding  in the right groin region may be from recent vascular catheterization. Patient is status post left hip replacement. IMPRESSION: 1. Multiple central sinus cysts in the left kidney have increased in attenuation since the prior study. Imaging features would be compatible with hemorrhage into the cyst and potentially into the left intrarenal collecting system. Etiology for the hemorrhage not evident but underlying nonvisualized urothelial lesion a consideration. 2. Stranding in the right groin, potentially from recent vascular access procedure. 3. Hepatic cysts. 4.  Aortic Atherosclerois (ICD10-170.0) 5. Small right pleural effusion. Electronically Signed   By: Misty Stanley M.D.   On: 02/11/2018 18:43   Ct Tibia Fibula Right W Contrast  Result Date: 02/11/2018 CLINICAL DATA:  Right lower leg pain. Chest CT 02/06/2018 positive for pulmonary embolus. Possible cyst in the right lower leg on Doppler ultrasound. EXAM: CT OF THE LOWER RIGHT EXTREMITY WITH CONTRAST TECHNIQUE: Multidetector CT imaging of the lower right extremity was performed according to the standard protocol following intravenous contrast administration. COMPARISON:  None. CONTRAST:  125 mL OMNIPAQUE IOHEXOL 350 MG/ML SOLN FINDINGS: Bones/Joint/Cartilage No acute or focal abnormality is identified. Mild  degenerative change is present about the knee. Ligaments Suboptimally assessed by CT. Muscles and Tendons Intact. An ill-defined low-attenuation area in the lateral soleus measuring 3.1 cm craniocaudal by 1.7 cm AP x 2.1 cm transverse is identified. Soft tissues Subcutaneous edema in the lower leg is more intense distally and laterally. Vascular structures are unremarkable in appearance. IMPRESSION: Ill-defined low-attenuation focus in the right soleus is not suggestive of a hematoma and could be due to a ganglion cyst, focal inflammatory change or myxoma. Subcutaneous edema about the lower leg is most intense distally and laterally. Electronically Signed   By: Inge Rise M.D.   On: 02/11/2018 18:44        Scheduled Meds: . aspirin EC  81 mg Oral Daily  . atorvastatin  20 mg Oral Daily  . docusate sodium  100 mg Oral BID  . furosemide  40 mg Oral Daily  . metoprolol tartrate  12.5 mg Oral BID  . polyethylene glycol  17 g Oral BID  . Tagrisso (osimertinib) - home med  80 mg Oral Q1200   Continuous Infusions: . heparin 1,400 Units/hr (02/11/18 1714)     LOS: 6 days     Vernell Leep, MD, FACP, [facility]. [facility] Pager [phone]  If 7PM-7AM, please contact night-coverage [email or web address] Password [email or web address] 02/12/2018, 1:06 PM

## 2018-02-12 NOTE — Progress Notes (Signed)
During bedside report, patient was found returning to bed from using the restroom. His gait unsteady due to right calf pain and patient equipment. Family not at bedside to assist. Patient did not call for assistance. He stated he was trying to also get to the phone that was ringing. Suggested to the patient to turn on the bed alarm during the night for safety: leg pain, gait, Heparin drip. Pt replied he does not want bed alarm because it will scare him.  He became upset with this suggestion and requested to turn off the bed alarm. Informed that the charge nurse of patient request. Will continue frequent rounding and to keep bed alarm on until further advised.

## 2018-02-12 NOTE — Consult Note (Signed)
Reason for Consult: Gross Hematuria, Left Peri-Pelvic Renal Cysts, Left Bifid Kidney, Prostate Screening  Referring Physician: Vernell Leep MD  James [name] is an 67 y.o. male.   HPI:   1 - Gross Hematuria - new gross hematuria with clots 02/2018 during hospitalization for large DVT/PE. Recent Hematurai CT 12/2017 w/o upper tract masses / stones. CT this admission with bleed into left peri-pelvic cysts. NO cysto as of yet.  2 - Left Peri-Pelvic Renal Cysts - non-comples multifocal left renal peri-pelvic cysts by contrast CT 2019. No hydro or complex features.  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  4 - Prostate Screening -  2019 - PSA 0.9  PMH sig for advanced lung cancer (s/p partial lung resection, then reurrecne, now chemo ber Dr. Earlie Server), DVT/PE, Mitral valve repair with CABG. His PCP is Eilleen Kempf MD with [facility] [place].  Today "James [name]" is seen in consultation for hematuria. He is on heparin bridging to xarelto for DVT, passing some clots, but no retention, some left flank pain that is controllable.    Past Medical History:  Diagnosis Date  . Adenocarcinoma of right lung, stage 1 ([place]) 09/06/2016  . Anxiety   . Arthritis    "knees, hips, back" (10/19/2012)  . Chronic diastolic congestive heart failure ([place])   . Chronic lower back pain   . Colon polyps    10/27/2002, repeat letter 09/17/2007  . Coronary artery disease   . Coronary artery disease involving native coronary artery of native heart without angina pectoris   . Depressive disorder, not elsewhere classified    no meds  . Diabetes mellitus without complication ([place])    diet controlled- no med  (while in hosp 4/18 -elevated cbg  . Dyspnea   . Fasting hyperglycemia   . GERD (gastroesophageal reflux disease)   . Heart murmur   . Hemoptysis    abnormal CT Chest 01/29/10 - ? new GG changes RUL > not viz on plain cxr 02/26/2010  . Hypertension   .  Mitral regurgitation    severe MR 08/2016  . MPN (myeloproliferative neoplasm) ([place])    1st detected 06/04/1998  . Obesity   . OSA on CPAP    last sleep study 10 years ago  . Other and unspecified hyperlipidemia   . Peripheral vascular disease ([place]) 08/2016   after lung surgey small clots in lungs,after hip dvt-5/16  . Pneumonia    4/18  . Positive PPD 1965   "non reactive in 2012" (10/19/2012)  . Routine general medical examination at a health care facility   . S/P CABG x 1 03/24/2017   LIMA to LAD  . S/P mitral valve repair 03/24/2017   Complex valvuloplasty including artificial Gore-tex neochord placement x6 and 28 mm Sorin Annuloflex posterior annuloplasty band  . Special screening for malignant neoplasm of prostate   . Spinal stenosis, unspecified region other than cervical   . Wrist pain, left     Past Surgical History:  Procedure Laterality Date  . ANTERIOR CRUCIATE LIGAMENT REPAIR Left 1967  . CARDIAC CATHETERIZATION  2000  . CHEST TUBE INSERTION Right 10/19/2012   post bronch  . COLONOSCOPY W/ POLYPECTOMY    . CORONARY ARTERY BYPASS GRAFT N/A 03/24/2017   Procedure: CORONARY ARTERY BYPASS GRAFTING (CABG)x1 using left internal mammary artery, LIMA-LAD;  Surgeon: Rexene Alberts, MD;  Location: [facility];  Service: Open Heart Surgery;  Laterality: N/A;  .  FLEXIBLE BRONCHOSCOPY  10/19/2012   Flexible video fiberoptic bronchoscopy with electromagnetic navigation and biopsies.  . INTRAVASCULAR PRESSURE WIRE/FFR STUDY N/A 08/11/2016   Procedure: Intravascular Pressure Wire/FFR Study;  Surgeon: [name], MD;  Location: [facility];  Service: Cardiovascular;  Laterality: N/A;  . IR ANGIOGRAM PULMONARY BILATERAL SELECTIVE  02/06/2018  . IR ANGIOGRAM SELECTIVE EACH ADDITIONAL VESSEL  02/06/2018  . IR ANGIOGRAM SELECTIVE EACH ADDITIONAL VESSEL  02/06/2018  . IR INFUSION THROMBOL ARTERIAL INITIAL (MS)  02/06/2018  . IR INFUSION THROMBOL ARTERIAL INITIAL (MS)  02/06/2018  . IR  THROMB F/U EVAL ART/VEN FINAL DAY (MS)  02/07/2018  . IR US GUIDE VASC ACCESS RIGHT  02/06/2018  . LOBECTOMY Right 09/02/2016   Procedure: RIGHT UPPER LOBECTOMY;  Surgeon: Melrose Nakayama, MD;  Location: [facility];  Service: Thoracic;  Laterality: Right;  . LYMPH NODE DISSECTION Right 09/02/2016   Procedure: LYMPH NODE DISSECTION, RIGHT LUNG;  Surgeon: Melrose Nakayama, MD;  Location: [facility];  Service: Thoracic;  Laterality: Right;  . MITRAL VALVE REPAIR N/A 03/24/2017   Procedure: MITRAL VALVE REPAIR (MVR) with Sorin Carbomedics Annuloflex size 28;  Surgeon: Rexene Alberts, MD;  Location: [facility];  Service: Open Heart Surgery;  Laterality: N/A;  . RIGHT/LEFT HEART CATH AND CORONARY ANGIOGRAPHY N/A 08/11/2016   Procedure: Right/Left Heart Cath and Coronary Angiography;  Surgeon: [name], MD;  Location: [facility];  Service: Cardiovascular;  Laterality: N/A;  . TEE WITHOUT CARDIOVERSION N/A 07/17/2016   Procedure: TRANSESOPHAGEAL ECHOCARDIOGRAM (TEE);  Surgeon: [name], MD;  Location: [facility];  Service: Cardiovascular;  Laterality: N/A;  . TEE WITHOUT CARDIOVERSION N/A 03/24/2017   Procedure: TRANSESOPHAGEAL ECHOCARDIOGRAM (TEE);  Surgeon: Rexene Alberts, MD;  Location: [facility];  Service: Open Heart Surgery;  Laterality: N/A;  . TONSILLECTOMY  1950's  . TOTAL HIP ARTHROPLASTY Left 10/05/2014   dr [name]  . TOTAL HIP ARTHROPLASTY Left 10/05/2014   Procedure: LEFT TOTAL HIP ARTHROPLASTY ANTERIOR APPROACH;  Surgeon: [name], MD;  Location: [facility];  Service: Orthopedics;  Laterality: Left;  Marland Kitchen VIDEO ASSISTED THORACOSCOPY (VATS)/WEDGE RESECTION Right 09/02/2016   Procedure: VIDEO ASSISTED THORACOSCOPY (VATS)/RIGHT UPPER LOBE WEDGE RESECTION;  Surgeon: Melrose Nakayama, MD;  Location: [facility];  Service: Thoracic;  Laterality: Right;  Marland Kitchen VIDEO BRONCHOSCOPY WITH ENDOBRONCHIAL NAVIGATION N/A 10/19/2012   Procedure: VIDEO BRONCHOSCOPY WITH ENDOBRONCHIAL NAVIGATION;  Surgeon:  [name], MD;  Location: [facility];  Service: Thoracic;  Laterality: N/A;  . WRIST RECONSTRUCTION Left 12/2009   'proximal row carpectomy" [name]    Family History  Problem Relation Age of Onset  . Heart failure Mother   . Heart disease Father   . Heart attack Father   . Heart disease Paternal Uncle   . Prostate cancer Neg Hx   . Colon cancer Neg Hx   . Hypertension Neg Hx   . Hyperlipidemia Neg Hx   . Diabetes Neg Hx     Social History:  reports that he has never smoked. He has never used smokeless tobacco. He reports that he drinks about 1.0 standard drinks of alcohol per week. He reports that he does not use drugs.  Allergies:  Allergies  Allergen Reactions  . Symbicort [Budesonide-Formoterol Fumarate] Other (See Comments) and Cough    Chest pain, wheezing   . Amoxicillin Itching and Other (See Comments)    Has patient had a PCN reaction causing immediate rash, facial/tongue/throat swelling, SOB or lightheadedness with hypotension:  No Has patient had a PCN reaction causing severe rash involving mucus membranes or skin necrosis: No Has patient had a PCN reaction that required hospitalization No Has patient had a PCN reaction occurring within the last 10 years: No If all of the above answers are "NO", then may proceed with Cephalosporin use.     Medications: I have reviewed the patient's current medications.  Results for orders placed or performed during the hospital encounter of 02/06/18 (from the past 48 hour(s))  Heparin level (unfractionated)     Status: None   Collection Time: 02/11/18  4:34 AM  Result Value Ref Range   Heparin Unfractionated 0.46 0.30 - 0.70 IU/mL    Comment: (NOTE) If heparin results are below expected values, and patient dosage has  been confirmed, suggest follow up testing of antithrombin III levels. Performed at [facility], [place] [address], [place], [place] [address]   CBC     Status: Abnormal   Collection Time: 02/11/18  4:34 AM   Result Value Ref Range   WBC 6.8 4.0 - 10.5 K/uL   RBC 4.39 4.22 - 5.81 MIL/uL   Hemoglobin 12.9 (L) 13.0 - 17.0 g/dL   HCT 39.7 39.0 - 52.0 %   MCV 90.4 78.0 - 100.0 fL   MCH 29.4 26.0 - 34.0 pg   MCHC 32.5 30.0 - 36.0 g/dL   RDW 14.2 11.5 - 15.5 %   Platelets 140 (L) 150 - 400 K/uL    Comment: Performed at [facility], [place] [address], [place], [place] [address]  CK     Status: Abnormal   Collection Time: 02/11/18  9:30 AM  Result Value Ref Range   Total CK 516 (H) 49 - 397 U/L    Comment: Performed at [facility], [place] [address], [place], [place] [address]  Urinalysis, Routine w reflex microscopic     Status: Abnormal   Collection Time: 02/11/18  9:38 AM  Result Value Ref Range   Color, Urine RED (A) YELLOW    Comment: BIOCHEMICALS MAY BE AFFECTED BY COLOR   APPearance TURBID (A) CLEAR   Specific Gravity, Urine  1.005 - 1.030    TEST NOT REPORTED DUE TO COLOR INTERFERENCE OF URINE PIGMENT   pH  5.0 - 8.0    TEST NOT REPORTED DUE TO COLOR INTERFERENCE OF URINE PIGMENT   Glucose, UA (A) NEGATIVE mg/dL    TEST NOT REPORTED DUE TO COLOR INTERFERENCE OF URINE PIGMENT   Hgb urine dipstick (A) NEGATIVE    TEST NOT REPORTED DUE TO COLOR INTERFERENCE OF URINE PIGMENT   Bilirubin Urine (A) NEGATIVE    TEST NOT REPORTED DUE TO COLOR INTERFERENCE OF URINE PIGMENT   Ketones, ur NEGATIVE NEGATIVE mg/dL   Protein, ur (A) NEGATIVE mg/dL    TEST NOT REPORTED DUE TO COLOR INTERFERENCE OF URINE PIGMENT   Nitrite (A) NEGATIVE    TEST NOT REPORTED DUE TO COLOR INTERFERENCE OF URINE PIGMENT   Leukocytes, UA (A) NEGATIVE    TEST NOT REPORTED DUE TO COLOR INTERFERENCE OF URINE PIGMENT   RBC / HPF >50 (H) 0 - 5 RBC/hpf   WBC, UA >50 (H) 0 - 5 WBC/hpf   Bacteria, UA RARE (A) NONE SEEN    Comment: Performed at [facility], [place] [address], [place], [place] [address]  Urinalysis, Microscopic (reflex)     Status: Abnormal   Collection Time: 02/11/18  9:38 AM  Result Value Ref  Range   RBC / HPF >50 0 - 5 RBC/hpf   WBC, UA >50 0 - 5 WBC/hpf   Bacteria, UA RARE (A) NONE SEEN   Squamous Epithelial / LPF 0-5 0 - 5    Comment: Performed at [facility], [place] [address], [place] [address]  CBC     Status: None   Collection Time: 02/11/18  8:04 PM  Result Value Ref Range   WBC 9.0 4.0 - 10.5 K/uL   RBC 4.65 4.22 - 5.81 MIL/uL   Hemoglobin 13.7 13.0 - 17.0 g/dL   HCT 42.6 39.0 - 52.0 %   MCV 91.6 78.0 - 100.0 fL   MCH 29.5 26.0 - 34.0 pg   MCHC 32.2 30.0 - 36.0 g/dL   RDW 14.4 11.5 - 15.5 %   Platelets 168 150 - 400 K/uL    Comment: Performed at [facility] [address] [place], [place] [address]  Heparin level (unfractionated)     Status: None   Collection Time: 02/12/18  3:13 AM  Result Value Ref Range   Heparin Unfractionated 0.48 0.30 - 0.70 IU/mL    Comment: (NOTE) If heparin results are below expected values, and patient dosage has  been confirmed, suggest follow up testing of antithrombin III levels. Performed at [facility], [place] [address], [place], [place] [address]   CBC     Status: Abnormal   Collection Time: 02/12/18  3:13 AM  Result Value Ref Range   WBC 8.4 4.0 - 10.5 K/uL   RBC 4.24 4.22 - 5.81 MIL/uL   Hemoglobin 12.4 (L) 13.0 - 17.0 g/dL   HCT 38.9 (L) 39.0 - 52.0 %   MCV 91.7 78.0 - 100.0 fL   MCH 29.2 26.0 - 34.0 pg   MCHC 31.9 30.0 - 36.0 g/dL   RDW 14.5 11.5 - 15.5 %   Platelets 164 150 - 400 K/uL    Comment: Performed at [facility], [place] [address] [place], [place] [address]  Basic metabolic panel     Status: Abnormal   Collection Time: 02/12/18  3:13 AM  Result Value Ref Range   Sodium 140 135 - 145 mmol/L   Potassium 4.3 3.5 - 5.1 mmol/L   Chloride 105 98 - 111 mmol/L   CO2 27 22 - 32 mmol/L   Glucose, Bld 110 (H) 70 - 99 mg/dL   BUN 11 8 - 23 mg/dL   Creatinine, Ser 1.32 (H) 0.61 - 1.24 mg/dL   Calcium 9.1 8.9 - 10.3 mg/dL   GFR calc non Af Amer 54 (L) >60 mL/min   GFR calc Af  Amer >60 >60 mL/min    Comment: (NOTE) The eGFR has been calculated using the CKD EPI equation. This calculation has not been validated in all clinical situations. eGFR's persistently <60 mL/min signify possible Chronic Kidney Disease.    Anion gap 8 5 - 15    Comment: Performed at [facility] [address] [place], [place] [address]    Ct Abdomen Pelvis W Wo Contrast  Result Date: 02/11/2018 CLINICAL DATA:  Hematuria.  History of lung cancer and PE. EXAM: CT ABDOMEN AND PELVIS WITHOUT AND WITH CONTRAST TECHNIQUE: Multidetector CT imaging of the abdomen and pelvis was performed following the standard protocol before and following the bolus administration of intravenous contrast. CONTRAST:  19m OMNIPAQUE IOHEXOL 350 MG/ML SOLN COMPARISON:  01/03/2018 FINDINGS: Lower chest: Small right pleural effusion. Hepatobiliary: 4.6 cm  cyst identified posterior right liver. 2.3 cm apparent cyst identified in the lateral segment left liver. There is no evidence for gallstones, gallbladder wall thickening, or pericholecystic fluid. No intrahepatic or extrahepatic biliary dilation. Pancreas: No focal mass lesion. No dilatation of the main duct. No intraparenchymal cyst. No peripancreatic edema. Spleen: No splenomegaly. No focal mass lesion. Adrenals/Urinary Tract: No adrenal nodule or mass. Right kidney unremarkable. Right ureter unremarkable. Previous CT showed multiple central sinus cysts in the left kidney. Many of these central sinus cysts have increased substantially in attenuation in the interval compatible with hemorrhage there is left perinephric edema. No left hydroureter although there is left periureteric edema. Stomach/Bowel: Stomach is nondistended. No gastric wall thickening. No evidence of outlet obstruction. Duodenum is normally positioned as is the ligament of Treitz. No small bowel wall thickening. No small bowel dilatation. The terminal ileum is normal. The appendix is not visualized, but  there is no edema or inflammation in the region of the cecum. No gross colonic mass. No colonic wall thickening. No substantial diverticular change. Vascular/Lymphatic: There is abdominal aortic atherosclerosis without aneurysm. There is no gastrohepatic or hepatoduodenal ligament lymphadenopathy. No intraperitoneal or retroperitoneal lymphadenopathy. No pelvic sidewall lymphadenopathy. Reproductive: Prostate gland is enlarged. Other: No intraperitoneal free fluid. Musculoskeletal: Stranding in the right groin region may be from recent vascular catheterization. Patient is status post left hip replacement. IMPRESSION: 1. Multiple central sinus cysts in the left kidney have increased in attenuation since the prior study. Imaging features would be compatible with hemorrhage into the cyst and potentially into the left intrarenal collecting system. Etiology for the hemorrhage not evident but underlying nonvisualized urothelial lesion a consideration. 2. Stranding in the right groin, potentially from recent vascular access procedure. 3. Hepatic cysts. 4.  Aortic Atherosclerois (ICD10-170.0) 5. Small right pleural effusion. Electronically Signed   By: Misty Stanley M.D.   On: 02/11/2018 18:43   Ct Tibia Fibula Right W Contrast  Result Date: 02/11/2018 CLINICAL DATA:  Right lower leg pain. Chest CT 02/06/2018 positive for pulmonary embolus. Possible cyst in the right lower leg on Doppler ultrasound. EXAM: CT OF THE LOWER RIGHT EXTREMITY WITH CONTRAST TECHNIQUE: Multidetector CT imaging of the lower right extremity was performed according to the standard protocol following intravenous contrast administration. COMPARISON:  None. CONTRAST:  125 mL OMNIPAQUE IOHEXOL 350 MG/ML SOLN FINDINGS: Bones/Joint/Cartilage No acute or focal abnormality is identified. Mild degenerative change is present about the knee. Ligaments Suboptimally assessed by CT. Muscles and Tendons Intact. An ill-defined low-attenuation area in the lateral  soleus measuring 3.1 cm craniocaudal by 1.7 cm AP x 2.1 cm transverse is identified. Soft tissues Subcutaneous edema in the lower leg is more intense distally and laterally. Vascular structures are unremarkable in appearance. IMPRESSION: Ill-defined low-attenuation focus in the right soleus is not suggestive of a hematoma and could be due to a ganglion cyst, focal inflammatory change or myxoma. Subcutaneous edema about the lower leg is most intense distally and laterally. Electronically Signed   By: Inge Rise M.D.   On: 02/11/2018 18:44    Review of Systems  Constitutional: Negative.   HENT: Negative.   Eyes: Negative.   Respiratory: Negative.   Cardiovascular: Negative.   Gastrointestinal: Negative.  Negative for nausea and vomiting.  Genitourinary: Positive for flank pain and hematuria.  Skin: Negative.   Neurological: Negative.  Negative for dizziness, focal weakness and loss of consciousness.  Endo/Heme/Allergies: Negative.   Psychiatric/Behavioral: Negative.    Blood pressure 137/80, pulse 79, temperature 98.2  F (36.8 C), temperature source Oral, resp. rate 18, height _0  (1.753 m), weight 100.9 kg, SpO2 97 %. Physical Exam  Assessment/Plan:  1 - Gross Hematuria - likely from bleed into renal cysts on left. Will need cysto in elective / outpatient setting to confirm no lower tract etiologies. AS hgb acceptable, no retention, and he has compelling need for anticoagulation, I feel OK to continue. Should frank retention develop, may need large catheter / irrigation. Should hemodynamic instability arise, would consider renal arteriography / possibe selective embolization, but last resort.   We discussed warnings for orthostasis or clot retention.   2 - Left Peri-Pelvic Renal Cysts - no specific intervention warranted as low risk.   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  4 - Prostate Screening - up to date this year, DC future PSA given oncologic  comorbidity.   2019 - PSA 0.9  Donye Dauenhauer 02/12/2018, 1:04 PM

## 2018-02-12 NOTE — Consult Note (Signed)
ORTHOPAEDIC CONSULTATION  REQUESTING PHYSICIAN: Modena Jansky, MD  PCP:  [name], MD  Chief Complaint: Right lower leg pain  HPI: James [name] is a 67 y.o. male with a very complex medical history.  He was admitted about 6 days ago with a submassive pulmonary embolism.  He is on a heparin drip.  He has a history of lung cancer, Coronary artery disease, mitral valve repair, anxiety, depression, and chronic diastolic CHF.  About 2 days ago, he began developing increasing pain to the lateral aspect of the lower leg.  He had an ultrasound of the lower extremity showing chronic DVT of the peroneal vein.  No thrombus to the femoral vein.  He had a CT of the abdomen and pelvis showing no occlusion to the right iliac vein.  He has a hemorrhagic kidney cyst.  He is having pain over the lateral calf there is worse with attempted weightbearing and activity.  A CT scan of the right lower extremity was performed which ruled out hematoma, abscess, fracture.  He was found to have a 2 cm mass in the lateral soleus.  Orthopedic consultation was placed to rule out compartment syndrome and other acute surgical issues.  Past Medical History:  Diagnosis Date  . Adenocarcinoma of right lung, stage 1 ([place]) 09/06/2016  . Anxiety   . Arthritis    "knees, hips, back" (10/19/2012)  . Chronic diastolic congestive heart failure ([place])   . Chronic lower back pain   . Colon polyps    10/27/2002, repeat letter 09/17/2007  . Coronary artery disease   . Coronary artery disease involving native coronary artery of native heart without angina pectoris   . Depressive disorder, not elsewhere classified    no meds  . Diabetes mellitus without complication ([place])    diet controlled- no med  (while in hosp 4/18 -elevated cbg  . Dyspnea   . Fasting hyperglycemia   . GERD (gastroesophageal reflux disease)   . Heart murmur   . Hemoptysis    abnormal CT Chest 01/29/10 - ? new GG changes RUL > not viz on plain cxr  02/26/2010  . Hypertension   . Mitral regurgitation    severe MR 08/2016  . MPN (myeloproliferative neoplasm) ([place])    1st detected 06/04/1998  . Obesity   . OSA on CPAP    last sleep study 10 years ago  . Other and unspecified hyperlipidemia   . Peripheral vascular disease ([place]) 08/2016   after lung surgey small clots in lungs,after hip dvt-5/16  . Pneumonia    4/18  . Positive PPD 1965   "non reactive in 2012" (10/19/2012)  . Routine general medical examination at a health care facility   . S/P CABG x 1 03/24/2017   LIMA to LAD  . S/P mitral valve repair 03/24/2017   Complex valvuloplasty including artificial Gore-tex neochord placement x6 and 28 mm Sorin Annuloflex posterior annuloplasty band  . Special screening for malignant neoplasm of prostate   . Spinal stenosis, unspecified region other than cervical   . Wrist pain, left    Past Surgical History:  Procedure Laterality Date  . ANTERIOR CRUCIATE LIGAMENT REPAIR Left 1967  . CARDIAC CATHETERIZATION  2000  . CHEST TUBE INSERTION Right 10/19/2012   post bronch  . COLONOSCOPY W/ POLYPECTOMY    . CORONARY ARTERY BYPASS GRAFT N/A 03/24/2017   Procedure: CORONARY ARTERY BYPASS GRAFTING (CABG)x1 using left internal mammary artery, LIMA-LAD;  Surgeon: Rexene Alberts, MD;  Location: [facility];  Service: Open Heart Surgery;  Laterality: N/A;  . FLEXIBLE BRONCHOSCOPY  10/19/2012   Flexible video fiberoptic bronchoscopy with electromagnetic navigation and biopsies.  . INTRAVASCULAR PRESSURE WIRE/FFR STUDY N/A 08/11/2016   Procedure: Intravascular Pressure Wire/FFR Study;  Surgeon: [name], MD;  Location: [facility];  Service: Cardiovascular;  Laterality: N/A;  . IR ANGIOGRAM PULMONARY BILATERAL SELECTIVE  02/06/2018  . IR ANGIOGRAM SELECTIVE EACH ADDITIONAL VESSEL  02/06/2018  . IR ANGIOGRAM SELECTIVE EACH ADDITIONAL VESSEL  02/06/2018  . IR INFUSION THROMBOL ARTERIAL INITIAL (MS)  02/06/2018  . IR INFUSION THROMBOL ARTERIAL  INITIAL (MS)  02/06/2018  . IR THROMB F/U EVAL ART/VEN FINAL DAY (MS)  02/07/2018  . IR US GUIDE VASC ACCESS RIGHT  02/06/2018  . LOBECTOMY Right 09/02/2016   Procedure: RIGHT UPPER LOBECTOMY;  Surgeon: Melrose Nakayama, MD;  Location: [facility];  Service: Thoracic;  Laterality: Right;  . LYMPH NODE DISSECTION Right 09/02/2016   Procedure: LYMPH NODE DISSECTION, RIGHT LUNG;  Surgeon: Melrose Nakayama, MD;  Location: [facility];  Service: Thoracic;  Laterality: Right;  . MITRAL VALVE REPAIR N/A 03/24/2017   Procedure: MITRAL VALVE REPAIR (MVR) with Sorin Carbomedics Annuloflex size 28;  Surgeon: Rexene Alberts, MD;  Location: [facility];  Service: Open Heart Surgery;  Laterality: N/A;  . RIGHT/LEFT HEART CATH AND CORONARY ANGIOGRAPHY N/A 08/11/2016   Procedure: Right/Left Heart Cath and Coronary Angiography;  Surgeon: [name], MD;  Location: [facility];  Service: Cardiovascular;  Laterality: N/A;  . TEE WITHOUT CARDIOVERSION N/A 07/17/2016   Procedure: TRANSESOPHAGEAL ECHOCARDIOGRAM (TEE);  Surgeon: [name], MD;  Location[facility] ENDOSCOPY;  Service: Cardiovascular;  Laterality: N/A;  . TEE WITHOUT CARDIOVERSION N/A 03/24/2017   Procedure: TRANSESOPHAGEAL ECHOCARDIOGRAM (TEE);  Surgeon: Rexene Alberts, MD;  Location: [place];  Service: Open Heart Surgery;  Laterality: N/A;  . TONSILLECTOMY  1950's  . TOTAL HIP ARTHROPLASTY Left 10/05/2014   dr [name]  . TOTAL HIP ARTHROPLASTY Left 10/05/2014   Procedure: LEFT TOTAL HIP ARTHROPLASTY ANTERIOR APPROACH;  Surgeon: [name], MD;  Location: [place];  Service: Orthopedics;  Laterality: Left;  Marland Kitchen VIDEO ASSISTED THORACOSCOPY (VATS)/WEDGE RESECTION Right 09/02/2016   Procedure: VIDEO ASSISTED THORACOSCOPY (VATS)/RIGHT UPPER LOBE WEDGE RESECTION;  Surgeon: Melrose Nakayama, MD;  Location: [place];  Service: Thoracic;  Laterality: Right;  Marland Kitchen VIDEO BRONCHOSCOPY WITH ENDOBRONCHIAL NAVIGATION N/A 10/19/2012   Procedure: VIDEO BRONCHOSCOPY WITH  ENDOBRONCHIAL NAVIGATION;  Surgeon: [name], MD;  Location: [place];  Service: Thoracic;  Laterality: N/A;  . WRIST RECONSTRUCTION Left 12/2009   'proximal row carpectomy" [place] History   Socioeconomic History  . Marital status: Married    Spouse name: Not on file  . Number of children: 3  . Years of education: Not on file  . Highest education level: Not on file  Occupational History  . Occupation: Retired, Financial risk analyst  . Occupation: Retired, Real estate    Comment: [place]  . Financial resource strain: Not on file  . Food insecurity:    Worry: Patient refused    Inability: Patient refused  . Transportation needs:    Medical: Patient refused    Non-medical: Patient refused  Tobacco Use  . Smoking status: Never Smoker  . Smokeless tobacco: Never Used  Substance and Sexual Activity  . Alcohol use: Yes    Alcohol/week: 1.0 standard drinks    Types: 1 Cans  of beer per week    Comment: none since 3 weeks  . Drug use: No  . Sexual activity: Never    Partners: Female  Lifestyle  . Physical activity:    Days per week: Patient refused    Minutes per session: Patient refused  . Stress: Not on file  Relationships  . Social connections:    Talks on phone: Not on file    Gets together: Not on file    Attends religious service: Not on file    Active member of club or organization: Not on file    Attends meetings of clubs or organizations: Not on file    Relationship status: Not on file  Other Topics Concern  . Not on file  Social History Narrative   HSG, [employer]. Married '73.  2 sons - '74,   '76;  1 daughter -  '77  6 grandchildren.   Work - IT consultant now retired. ACP - not fully discussed.                   Family History  Problem Relation Age of Onset  . Heart failure Mother   . Heart disease Father   . Heart attack Father   . Heart disease Paternal Uncle   . Prostate cancer Neg Hx   . Colon cancer  Neg Hx   . Hypertension Neg Hx   . Hyperlipidemia Neg Hx   . Diabetes Neg Hx    Allergies  Allergen Reactions  . Symbicort [Budesonide-Formoterol Fumarate] Other (See Comments) and Cough    Chest pain, wheezing   . Amoxicillin Itching and Other (See Comments)    Has patient had a PCN reaction causing immediate rash, facial/tongue/throat swelling, SOB or lightheadedness with hypotension: No Has patient had a PCN reaction causing severe rash involving mucus membranes or skin necrosis: No Has patient had a PCN reaction that required hospitalization No Has patient had a PCN reaction occurring within the last 10 years: No If all of the above answers are "NO", then may proceed with Cephalosporin use.    Prior to Admission medications   Medication Sig Start Date End Date Taking? Authorizing Provider  acetaminophen (TYLENOL) 325 MG tablet Take 650 mg by mouth every 6 (six) hours as needed for moderate pain or headache.   Yes [provider]  aspirin EC 81 MG tablet Take 1 tablet (81 mg total) by mouth daily. 07/27/16  Yes [name], MD  atorvastatin (LIPITOR) 20 MG tablet TAKE 1 TABLET (20 MG TOTAL) BY MOUTH DAILY. 12/12/17  Yes [name], MD  furosemide (LASIX) 40 MG tablet TAKE 1 TABLET BY MOUTH EVERY DAY Patient taking differently: Take 40 mg by mouth as needed for fluid.  11/03/17  Yes Martinique, Peter M, MD  metoprolol tartrate (LOPRESSOR) 25 MG tablet TAKE 1/2 TABLET BY MOUTH 2 TIMES DAILY Patient taking differently: Take 12.5 mg by mouth 2 (two) times daily.  06/29/17  Yes Martinique, Peter M, MD  Omega-3 Fatty Acids (FISH OIL) 500 MG CAPS Take 500 mg by mouth daily.    Yes [provider]  potassium chloride SA (K-DUR,KLOR-CON) 20 MEQ tablet Take 1 tablet (20 mEq total) by mouth daily as needed. With lasix 04/14/17  Yes Meng, [place], PA  sodium chloride (OCEAN) 0.65 % SOLN nasal spray Place 1 spray into both nostrils as needed for congestion.   Yes [provider]    TAGRISSO 80 MG tablet TAKE 1  TABLET (80 MG TOTAL) BY MOUTH DAILY. Patient taking differently: Take 80 mg by mouth daily.  01/19/18  Yes Curt Bears, MD  traMADol (ULTRAM) 50 MG tablet Take 1 tablet (50 mg total) by mouth every 6 (six) hours as needed (may take one or two tablets every six hrs prn). 04/16/17  Yes Rexene Alberts, MD   Ct Abdomen Pelvis W Wo Contrast  Result Date: 02/11/2018 CLINICAL DATA:  Hematuria.  History of lung cancer and PE. EXAM: CT ABDOMEN AND PELVIS WITHOUT AND WITH CONTRAST TECHNIQUE: Multidetector CT imaging of the abdomen and pelvis was performed following the standard protocol before and following the bolus administration of intravenous contrast. CONTRAST:  191mL OMNIPAQUE IOHEXOL 350 MG/ML SOLN COMPARISON:  01/03/2018 FINDINGS: Lower chest: Small right pleural effusion. Hepatobiliary: 4.6 cm cyst identified posterior right liver. 2.3 cm apparent cyst identified in the lateral segment left liver. There is no evidence for gallstones, gallbladder wall thickening, or pericholecystic fluid. No intrahepatic or extrahepatic biliary dilation. Pancreas: No focal mass lesion. No dilatation of the main duct. No intraparenchymal cyst. No peripancreatic edema. Spleen: No splenomegaly. No focal mass lesion. Adrenals/Urinary Tract: No adrenal nodule or mass. Right kidney unremarkable. Right ureter unremarkable. Previous CT showed multiple central sinus cysts in the left kidney. Many of these central sinus cysts have increased substantially in attenuation in the interval compatible with hemorrhage there is left perinephric edema. No left hydroureter although there is left periureteric edema. Stomach/Bowel: Stomach is nondistended. No gastric wall thickening. No evidence of outlet obstruction. Duodenum is normally positioned as is the ligament of Treitz. No small bowel wall thickening. No small bowel dilatation. The terminal ileum is normal. The appendix is not visualized, but there is no  edema or inflammation in the region of the cecum. No gross colonic mass. No colonic wall thickening. No substantial diverticular change. Vascular/Lymphatic: There is abdominal aortic atherosclerosis without aneurysm. There is no gastrohepatic or hepatoduodenal ligament lymphadenopathy. No intraperitoneal or retroperitoneal lymphadenopathy. No pelvic sidewall lymphadenopathy. Reproductive: Prostate gland is enlarged. Other: No intraperitoneal free fluid. Musculoskeletal: Stranding in the right groin region may be from recent vascular catheterization. Patient is status post left hip replacement. IMPRESSION: 1. Multiple central sinus cysts in the left kidney have increased in attenuation since the prior study. Imaging features would be compatible with hemorrhage into the cyst and potentially into the left intrarenal collecting system. Etiology for the hemorrhage not evident but underlying nonvisualized urothelial lesion a consideration. 2. Stranding in the right groin, potentially from recent vascular access procedure. 3. Hepatic cysts. 4.  Aortic Atherosclerois (ICD10-170.0) 5. Small right pleural effusion. Electronically Signed   By: Misty Stanley M.D.   On: 02/11/2018 18:43   Ct Tibia Fibula Right W Contrast  Result Date: 02/11/2018 CLINICAL DATA:  Right lower leg pain. Chest CT 02/06/2018 positive for pulmonary embolus. Possible cyst in the right lower leg on Doppler ultrasound. EXAM: CT OF THE LOWER RIGHT EXTREMITY WITH CONTRAST TECHNIQUE: Multidetector CT imaging of the lower right extremity was performed according to the standard protocol following intravenous contrast administration. COMPARISON:  None. CONTRAST:  125 mL OMNIPAQUE IOHEXOL 350 MG/ML SOLN FINDINGS: Bones/Joint/Cartilage No acute or focal abnormality is identified. Mild degenerative change is present about the knee. Ligaments Suboptimally assessed by CT. Muscles and Tendons Intact. An ill-defined low-attenuation area in the lateral soleus  measuring 3.1 cm craniocaudal by 1.7 cm AP x 2.1 cm transverse is identified. Soft tissues Subcutaneous edema in the lower leg is more intense distally and  laterally. Vascular structures are unremarkable in appearance. IMPRESSION: Ill-defined low-attenuation focus in the right soleus is not suggestive of a hematoma and could be due to a ganglion cyst, focal inflammatory change or myxoma. Subcutaneous edema about the lower leg is most intense distally and laterally. Electronically Signed   By: Inge Rise M.D.   On: 02/11/2018 18:44    Positive ROS: All other systems have been reviewed and were otherwise negative with the exception of those mentioned in the HPI and as above.  Physical Exam: General: Alert, no acute distress Cardiovascular: No pedal edema Respiratory: No cyanosis, no use of accessory musculature GI: No organomegaly, abdomen is soft and non-tender Skin: No lesions in the area of chief complaint Neurologic: Sensation intact distally Psychiatric: Patient is competent for consent with normal mood and affect Lymphatic: No axillary or cervical lymphadenopathy  MUSCULOSKELETAL: Examination of the right lower extremity reveals no skin wounds or lesions.  No erythema.  He does have mild swelling over the lateral lower leg.  Compartments are soft and compressible.  He has active motor function dorsiflexion, plantarflexion, and great toe extension.  No pain with passive stretch.  He has palpable pedal pulses.  No sensory deficit.  Assessment: Right lower leg pain. Chronic right peroneal DVT. PE with heparin drip. Lung cancer. Multiple medical problems.  Plan: I discussed the findings with the patient and his wife.  Unfortunately, this gentleman is extremely medically ill, however there is no acute orthopedic surgical indication.  There is no impending compartment syndrome or infection.  I suspect that his pain and swelling is due to the chronic DVT.  I recommend symptomatic  treatment with elevation toes above nose and warm compress.  MRI of the right lower leg can be obtained to further characterize the mass in the soleus, which is an incidental finding possible soft tissue metastasis.  Follow-up with an orthopedic oncologist can then be arranged on an outpatient basis.  All questions were solicited and answered.    Bertram Savin, MD Cell [phone]    02/12/2018 12:51 PM

## 2018-02-12 NOTE — Progress Notes (Signed)
[place] for Heparin Indication: pulmonary embolus  Allergies  Allergen Reactions  . Symbicort [Budesonide-Formoterol Fumarate] Other (See Comments) and Cough    Chest pain, wheezing   . Amoxicillin Itching and Other (See Comments)    Has patient had a PCN reaction causing immediate rash, facial/tongue/throat swelling, SOB or lightheadedness with hypotension: No Has patient had a PCN reaction causing severe rash involving mucus membranes or skin necrosis: No Has patient had a PCN reaction that required hospitalization No Has patient had a PCN reaction occurring within the last 10 years: No If all of the above answers are "NO", then may proceed with Cephalosporin use.     Patient Measurements: Height: 5\' 9"  (175.3 cm) Weight: 222 lb 7.1 oz (100.9 kg) IBW/kg (Calculated) : 70.7 Heparin Dosing Weight: ~92 kg  Vital Signs: Temp: 97.6 F (36.4 C) (10/04 2319) Temp Source: Oral (10/04 2319) BP: 155/101 (10/04 2319) Pulse Rate: 87 (10/04 2319)  Labs: Recent Labs    02/10/18 0417  02/11/18 0434 02/11/18 0930 02/11/18 2004 02/12/18 0313  HGB  --    < > 12.9*  --  13.7 12.4*  HCT  --   --  39.7  --  42.6 38.9*  PLT  --   --  140*  --  168 164  HEPARINUNFRC 0.40  --  0.46  --   --  0.48  CREATININE 1.04  --   --   --   --  1.32*  CKTOTAL  --   --   --  516*  --   --    < > = values in this interval not displayed.    Estimated Creatinine Clearance: 62.7 mL/min (A) (by C-G formula based on SCr of 1.32 mg/dL (H)).   Assessment: 67 yo M presents with SOB and CT showed submassive bilateral PE with heart strain. Patient is s/p EKOS w/ bilateral intra-arterial tPA and currently on IV heparin only.  Heparin level remains therapeutic today at 0.48 on heparin infusion at 1400 units/hr. Hgb slightly lower at 12.4 but stable. Some hematuria noted and some minor bleeding into cysts on left kidney noted on 10/4 CT. Continuing heparin despite this  due to submassive PE with RV strain.    Goal of Therapy:  Heparin level 0.3-0.7 units/ml Monitor platelets by anticoagulation protocol: Yes   Plan:  1. Continue IV heparin gtt at 1400 units/hr 2. Monitor daily heparin level, CBC, s/s of bleed 3. Plan to switch to Xarelto if remains stable  Harrietta Guardian, PharmD PGY1 Pharmacy Resident 02/12/2018    7:55 AM  Please check AMION for all [place] phone numbers

## 2018-02-13 ENCOUNTER — Inpatient Hospital Stay (HOSPITAL_COMMUNITY): Payer: Medicare Other

## 2018-02-13 DIAGNOSIS — N179 Acute kidney failure, unspecified: Secondary | ICD-10-CM

## 2018-02-13 DIAGNOSIS — S8011XS Contusion of right lower leg, sequela: Secondary | ICD-10-CM

## 2018-02-13 DIAGNOSIS — N189 Chronic kidney disease, unspecified: Secondary | ICD-10-CM

## 2018-02-13 DIAGNOSIS — R310 Gross hematuria: Secondary | ICD-10-CM

## 2018-02-13 LAB — CBC
HCT: 34.9 % — ABNORMAL LOW (ref 39.0–52.0)
Hemoglobin: 11.3 g/dL — ABNORMAL LOW (ref 13.0–17.0)
MCH: 29.4 pg (ref 26.0–34.0)
MCHC: 32.4 g/dL (ref 30.0–36.0)
MCV: 90.9 fL (ref 78.0–100.0)
Platelets: 160 10*3/uL (ref 150–400)
RBC: 3.84 MIL/uL — ABNORMAL LOW (ref 4.22–5.81)
RDW: 14.4 % (ref 11.5–15.5)
WBC: 9.2 10*3/uL (ref 4.0–10.5)

## 2018-02-13 LAB — BASIC METABOLIC PANEL
Anion gap: 11 (ref 5–15)
BUN: 14 mg/dL (ref 8–23)
CO2: 24 mmol/L (ref 22–32)
Calcium: 8.8 mg/dL — ABNORMAL LOW (ref 8.9–10.3)
Chloride: 101 mmol/L (ref 98–111)
Creatinine, Ser: 1.45 mg/dL — ABNORMAL HIGH (ref 0.61–1.24)
GFR calc Af Amer: 56 mL/min — ABNORMAL LOW (ref >60)
GFR calc non Af Amer: 48 mL/min — ABNORMAL LOW (ref >60)
Glucose, Bld: 124 mg/dL — ABNORMAL HIGH (ref 70–99)
Potassium: 4.1 mmol/L (ref 3.5–5.1)
Sodium: 136 mmol/L (ref 135–145)

## 2018-02-13 MED ORDER — GADOBUTROL 1 MMOL/ML IV SOLN
10.0000 mL | Freq: Once | INTRAVENOUS | Status: AC | PRN
  Administered 2018-02-13: 10 mL via INTRAVENOUS

## 2018-02-13 MED ORDER — FUROSEMIDE 40 MG PO TABS: 40.0000 mg | ORAL_TABLET | ORAL | Status: DC | PRN

## 2018-02-13 MED ORDER — DOCUSATE SODIUM 100 MG PO CAPS: 100.0000 mg | ORAL_CAPSULE | Freq: Two times a day (BID) | ORAL | 0 refills | Status: DC

## 2018-02-13 MED ORDER — RIVAROXABAN (XARELTO) VTE STARTER PACK (15 & 20 MG): ORAL_TABLET | ORAL | 0 refills | Status: DC

## 2018-02-13 MED ORDER — BISACODYL 10 MG RE SUPP: 10.0000 mg | Freq: Every day | RECTAL | 0 refills | Status: DC | PRN

## 2018-02-13 MED ORDER — POLYETHYLENE GLYCOL 3350 17 G PO PACK: 17.0000 g | PACK | Freq: Two times a day (BID) | ORAL | 0 refills | Status: DC

## 2018-02-13 NOTE — Progress Notes (Signed)
   02/13/18 0953  Urine Characteristics  Urinary Interventions Bladder scan  Bladder Scan Volume (mL) 26 mL    Pt voided 25 mL of cloudy amber urine, improved from bloody urine. Bladder scanned post void for above results.

## 2018-02-13 NOTE — Consult Note (Addendum)
Urology Consult Note   Subjective: Patient feeling well this morning, very pleasant and in good spirits Hematuria has waxed and waned in color, from almost clear to a darker red with small clots this morning. Subjectively emptying bladder well without issue Hgb 11.3 from 12.4 Cr 1.45 from 1.32 His wife is at bedside and involved in his care  Objective: Vital signs in last 24 hours: Temp:  [98.6 F (37 C)] 98.6 F (37 C) (10/06 0004) Pulse Rate:  [76-101] 93 (10/06 0830) Resp:  [15-18] 15 (10/06 0004) BP: (122-141)/(72-84) 128/77 (10/06 0830) SpO2:  [99 %] 99 % (10/06 0004) Weight:  [103.4 kg] 103.4 kg (10/06 0500)  Intake/Output from previous day: 10/05 0701 - 10/06 0700 In: -  Out: 725 [Urine:725] Intake/Output this shift: No intake/output data recorded.  Physical Exam:  General: Alert and oriented CV: RRR Lungs: Clear Abdomen: Soft, appropriately tender.  GU: no foley catheter.  Ext: NT, No erythema  Lab Results: Recent Labs    02/11/18 2004 02/12/18 0313 02/13/18 0335  HGB 13.7 12.4* 11.3*  HCT 42.6 38.9* 34.9*   BMET Recent Labs    02/12/18 0313 02/13/18 0335  NA 140 136  K 4.3 4.1  CL 105 101  CO2 27 24  GLUCOSE 110* 124*  BUN 11 14  CREATININE 1.32* 1.45*  CALCIUM 9.1 8.8*     Studies/Results: Ct Abdomen Pelvis W Wo Contrast  Result Date: 02/11/2018 CLINICAL DATA:  Hematuria.  History of lung cancer and PE. EXAM: CT ABDOMEN AND PELVIS WITHOUT AND WITH CONTRAST TECHNIQUE: Multidetector CT imaging of the abdomen and pelvis was performed following the standard protocol before and following the bolus administration of intravenous contrast. CONTRAST:  142mL OMNIPAQUE IOHEXOL 350 MG/ML SOLN COMPARISON:  01/03/2018 FINDINGS: Lower chest: Small right pleural effusion. Hepatobiliary: 4.6 cm cyst identified posterior right liver. 2.3 cm apparent cyst identified in the lateral segment left liver. There is no evidence for gallstones, gallbladder wall  thickening, or pericholecystic fluid. No intrahepatic or extrahepatic biliary dilation. Pancreas: No focal mass lesion. No dilatation of the main duct. No intraparenchymal cyst. No peripancreatic edema. Spleen: No splenomegaly. No focal mass lesion. Adrenals/Urinary Tract: No adrenal nodule or mass. Right kidney unremarkable. Right ureter unremarkable. Previous CT showed multiple central sinus cysts in the left kidney. Many of these central sinus cysts have increased substantially in attenuation in the interval compatible with hemorrhage there is left perinephric edema. No left hydroureter although there is left periureteric edema. Stomach/Bowel: Stomach is nondistended. No gastric wall thickening. No evidence of outlet obstruction. Duodenum is normally positioned as is the ligament of Treitz. No small bowel wall thickening. No small bowel dilatation. The terminal ileum is normal. The appendix is not visualized, but there is no edema or inflammation in the region of the cecum. No gross colonic mass. No colonic wall thickening. No substantial diverticular change. Vascular/Lymphatic: There is abdominal aortic atherosclerosis without aneurysm. There is no gastrohepatic or hepatoduodenal ligament lymphadenopathy. No intraperitoneal or retroperitoneal lymphadenopathy. No pelvic sidewall lymphadenopathy. Reproductive: Prostate gland is enlarged. Other: No intraperitoneal free fluid. Musculoskeletal: Stranding in the right groin region may be from recent vascular catheterization. Patient is status post left hip replacement. IMPRESSION: 1. Multiple central sinus cysts in the left kidney have increased in attenuation since the prior study. Imaging features would be compatible with hemorrhage into the cyst and potentially into the left intrarenal collecting system. Etiology for the hemorrhage not evident but underlying nonvisualized urothelial lesion a consideration. 2. Stranding in the  right groin, potentially from recent  vascular access procedure. 3. Hepatic cysts. 4.  Aortic Atherosclerois (ICD10-170.0) 5. Small right pleural effusion. Electronically Signed   By: Misty Stanley M.D.   On: 02/11/2018 18:43   Ct Tibia Fibula Right W Contrast  Result Date: 02/11/2018 CLINICAL DATA:  Right lower leg pain. Chest CT 02/06/2018 positive for pulmonary embolus. Possible cyst in the right lower leg on Doppler ultrasound. EXAM: CT OF THE LOWER RIGHT EXTREMITY WITH CONTRAST TECHNIQUE: Multidetector CT imaging of the lower right extremity was performed according to the standard protocol following intravenous contrast administration. COMPARISON:  None. CONTRAST:  125 mL OMNIPAQUE IOHEXOL 350 MG/ML SOLN FINDINGS: Bones/Joint/Cartilage No acute or focal abnormality is identified. Mild degenerative change is present about the knee. Ligaments Suboptimally assessed by CT. Muscles and Tendons Intact. An ill-defined low-attenuation area in the lateral soleus measuring 3.1 cm craniocaudal by 1.7 cm AP x 2.1 cm transverse is identified. Soft tissues Subcutaneous edema in the lower leg is more intense distally and laterally. Vascular structures are unremarkable in appearance. IMPRESSION: Ill-defined low-attenuation focus in the right soleus is not suggestive of a hematoma and could be due to a ganglion cyst, focal inflammatory change or myxoma. Subcutaneous edema about the lower leg is most intense distally and laterally. Electronically Signed   By: Inge Rise M.D.   On: 02/11/2018 18:44    Assessment/Plan:  1 - Gross Hematuria - likely from bleed into renal cysts on left. Will need cysto in elective / outpatient setting to confirm no lower tract etiologies. His hematuria waxed and waned overnight. It seems to be a little darker with clots this morning by report, although still able to empty bladder well subjectively. Would recommend a few post-void bladder scans to ensure adequate emptying. His Hgb was 11.3 from 12.4 this morning.  Hopefully, hematuria slowly resolves, although probably slower with therapeutic anticoagulation. I encouraged aggressive oral hydration while bleeding persists.   We again discussed warnings for orthostasis, symptomatic anemia or clot retention. It is not unreasonable for patient to discharge today, particularly as he has an excellent support system and understands return precautions.   2 - Left Peri-Pelvic Renal Cysts - no specific intervention warranted as low risk.   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  4 - Prostate Screening - up to date this year, DC future PSA given oncologic comorbidity.    LOS: 7 days   Fredricka Bonine 02/13/2018, 9:22 AM   I have seen and examined the patient and agree with Dr. Andreas Blower plan. ONgoign hematuria is dark and c/w resoling prior bleed. NO bright red blood to suggest significant active HU hemorrage. WE disucssed clot retention and orthostatic warnings should they develop. Pt ok for DC home from GU perspective.

## 2018-02-13 NOTE — Discharge Instructions (Signed)
Please get your medications reviewed and adjusted by your Primary MD. ° °Please request your Primary MD to go over all Hospital Tests and Procedure/Radiological results at the follow up, please get all Hospital records sent to your Prim MD by signing hospital release before you go home. ° °If you had Pneumonia of Lung problems at the Hospital: °Please get a 2 view Chest X ray done in 6-8 weeks after hospital discharge or sooner if instructed by your Primary MD. ° °If you have Congestive Heart Failure: °Please call your Cardiologist or Primary MD anytime you have any of the following symptoms:  °1) 3 pound weight gain in 24 hours or 5 pounds in 1 week  °2) shortness of breath, with or without a dry hacking cough  °3) swelling in the hands, feet or stomach  °4) if you have to sleep on extra pillows at night in order to breathe ° °Follow cardiac low salt diet and 1.5 lit/day fluid restriction. ° °If you have diabetes °Accuchecks 4 times/day, Once in AM empty stomach and then before each meal. °Log in all results and show them to your primary doctor at your next visit. °If any glucose reading is under 80 or above 300 call your primary MD immediately. ° °If you have Seizure/Convulsions/Epilepsy: °Please do not drive, operate heavy machinery, participate in activities at heights or participate in high speed sports until you have seen by Primary MD or a Neurologist and advised to do so again. ° °If you had Gastrointestinal Bleeding: °Please ask your Primary MD to check a complete blood count within one week of discharge or at your next visit. Your endoscopic/colonoscopic biopsies that are pending at the time of discharge, will also need to followed by your Primary MD. ° °Get Medicines reviewed and adjusted. °Please take all your medications with you for your next visit with your Primary MD ° °Please request your Primary MD to go over all hospital tests and procedure/radiological results at the follow up, please ask your  Primary MD to get all Hospital records sent to his/her office. ° °If you experience worsening of your admission symptoms, develop shortness of breath, life threatening emergency, suicidal or homicidal thoughts you must seek medical attention immediately by calling 911 or calling your MD immediately  if symptoms less severe. ° °You must read complete instructions/literature along with all the possible adverse reactions/side effects for all the Medicines you take and that have been prescribed to you. Take any new Medicines after you have completely understood and accpet all the possible adverse reactions/side effects.  ° °Do not drive or operate heavy machinery when taking Pain medications.  ° °Do not take more than prescribed Pain, Sleep and Anxiety Medications ° °Special Instructions: If you have smoked or chewed Tobacco  in the last 2 yrs please stop smoking, stop any regular Alcohol  and or any Recreational drug use. ° °Wear Seat belts while driving. ° °Please note °You were cared for by a hospitalist during your hospital stay. If you have any questions about your discharge medications or the care you received while you were in the hospital after you are discharged, you can call the unit and asked to speak with the hospitalist on call if the hospitalist that took care of you is not available. Once you are discharged, your primary care physician will handle any further medical issues. Please note that NO REFILLS for any discharge medications will be authorized once you are discharged, as it is imperative that you   return to your primary care physician (or establish a relationship with a primary care physician if you do not have one) for your aftercare needs so that they can reassess your need for medications and monitor your lab values.  You can reach the hospitalist office at phone [phone] or fax [phone]   If you do not have a primary care physician, you can call [phone] for a physician  referral.   Deep Vein Thrombosis Deep vein thrombosis (DVT) is a condition in which a blood clot forms in a deep vein, such as a lower leg, thigh, or arm vein. A clot is blood that has thickened into a gel or solid. This condition is dangerous. It can lead to serious and even life-threatening complications if the clot travels to the lungs and causes a blockage (pulmonary embolism). It can also damage veins in the leg. This can result in leg pain, swelling, discoloration, and sores (post-thrombotic syndrome). What are the causes? This condition may be caused by:  A slowdown of blood flow.  Damage to a vein.  A condition that makes blood clot more easily.  What increases the risk? The following factors may make you more likely to develop this condition:  Being overweight.  Being elderly, especially over age 52.  Sitting or lying down for more than four hours.  Lack of physical activity (sedentary lifestyle).  Being pregnant, giving birth, or having recently given birth.  Taking medicines that contain estrogen.  Smoking.  A history of any of the following: ? Blood clots or blood clotting disease. ? Peripheral vascular disease. ? Inflammatory bowel disease. ? Cancer. ? Heart disease. ? Genetic conditions that affect how blood clots. ? Neurological diseases that affect the legs (leg paresis). ? Injury. ? Major or lengthy surgery. ? A central line placed inside a large vein.  What are the signs or symptoms? Symptoms of this condition include:  Swelling, pain, or tenderness in an arm or leg.  Warmth, redness, or discoloration in an arm or leg.  If the clot is in your leg, symptoms may be more noticeable or worse when you stand or walk. Some people do not have any symptoms. How is this diagnosed? This condition is diagnosed with:  A medical history.  A physical exam.  Tests, such as: ? Blood tests. These are done to see how your blood clots. ? Imaging tests. These  are done to check for clots. Tests may include:  Ultrasound.  CT scan.  MRI.  X-ray.  Venogram. For this test, X-rays are taken after a dye is injected into a vein.  How is this treated? Treatment for this condition depends on the cause, your risk for bleeding or developing more clots, and any medical conditions you have. Treatment may include:  Taking blood thinners (also called anticoagulants). These medicines may be taken by mouth, injected under the skin, or injected through an IV tube (catheter). These medicines prevent clots from forming.  Injecting medicine that dissolves blood clots into the affected vein (catheter-directed thrombolysis).  Having surgery. Surgery may be done to: ? Remove the clot. ? Place a filter in a large vein to catch blood clots before they reach the lungs.  Some treatments may be continued for up to six months. Follow these instructions at home: If you are taking an oral blood thinner:  Take the medicine exactly as told by your health care provider. Some blood thinners need to be taken at the same time every day. Do not skip  a dose.  Ask your health care provider about what foods and drugs interact with the medicine.  Ask about possible side effects. General instructions  Blood thinners can cause easy bruising and difficulty stopping bleeding. Because of this, if you are taking or were given a blood thinner: ? Hold pressure over cuts for longer than usual. ? Tell your dentist and other health care providers that you are taking blood thinners before having any procedures that can cause bleeding. ? Avoid contact sports.  Take over-the-counter and prescription medicines only as told by your health care provider.  Return to your normal activities as told by your health care provider. Ask your health care provider what activities are safe for you.  Wear compression stockings if recommended by your health care provider.  Keep all follow-up visits  as told by your health care provider. This is important. How is this prevented? To lower your risk of developing this condition again:  For 30 or more minutes every day, do an activity that: ? Involves moving your arms and legs. ? Increases your heart rate.  When traveling for longer than four hours: ? Exercise your arms and legs every hour. ? Drink plenty of water. ? Avoid drinking alcohol.  Avoid sitting or lying for a long time without moving your legs.  Stay a healthy weight.  If you are a woman who is older than age 58, avoid unnecessary use of medicines that contain estrogen.  Do not use any products that contain nicotine or tobacco, such as cigarettes and e-cigarettes. This is especially important if you take estrogen medicines. If you need help quitting, ask your health care provider.  Contact a health care provider if:  You miss a dose of your blood thinner.  You have nausea, vomiting, or diarrhea that lasts for more than one day.  Your menstrual period is heavier than usual.  You have unusual bruising. Get help right away if:  You have new or increased pain, swelling, or redness in an arm or leg.  You have numbness or tingling in an arm or leg.  You have shortness of breath.  You have chest pain.  You have a rapid or irregular heartbeat.  You feel light-headed or dizzy.  You cough up blood.  There is blood in your vomit, stool, or urine.  You have a serious fall or accident, or you hit your head.  You have a severe headache or confusion.  You have a cut that will not stop bleeding. These symptoms may represent a serious problem that is an emergency. Do not wait to see if the symptoms will go away. Get medical help right away. Call your local emergency services (911 in the [place] Do not drive yourself to the hospital. Summary  DVT is a condition in which a blood clot forms in a deep vein, such as a lower leg, thigh, or arm vein.  Symptoms can include  swelling, warmth, pain, and redness in your leg or arm.  Treatment may include taking blood thinners, injecting medicine that dissolves blood clots,wearing compression stockings, or surgery.  If you are prescribed blood thinners, take them exactly as told. This information is not intended to replace advice given to you by your health care provider. Make sure you discuss any questions you have with your health care provider. Document Released: 04/27/2005 Document Revised: 05/30/2016 Document Reviewed: 05/30/2016 Elsevier Interactive Patient Education  2018 [employer].    Pulmonary Embolism A pulmonary embolism (PE) is a sudden  blockage or decrease of blood flow in one lung or both lungs. Most blockages come from a blood clot that forms in a lower leg, thigh, or arm vein (deep vein thrombosis, DVT) and travels to the lungs. A clot is blood that has thickened into a gel or solid. PE is a dangerous and life-threatening condition that needs to be treated right away. What are the causes? This condition is usually caused by a blood clot that forms in a vein and moves to the lungs. In rare cases, it may be caused by air, fat, part of a tumor, or other tissue that moves through the veins and into the lungs. What increases the risk? The following factors may make you more likely to develop this condition:  Having DVT or a history of DVT.  Being older than age 49.  Personal or family history of blood clots or blood clotting disease.  Major or lengthy surgery.  Orthopedic surgery, especially hip or knee replacement.  Traumatic injury, such as breaking a hip or leg.  Spinal cord injury.  Stroke.  Taking medicines that contain estrogen. These include birth control pills and hormone replacement therapy.  Long-term (chronic) lung or heart disease.  Cancer and chemotherapy.  Having a central venous catheter.  Pregnancy and the period after delivery.  What are the signs or  symptoms? Symptoms of this condition usually start suddenly and include:  Shortness of breath while active or at rest.  Coughing or coughing up blood or blood-tinged mucus.  Chest pain that is often worse with deep breaths.  Rapid or irregular heartbeat.  Feeling light-headed or dizzy.  Fainting.  Feeling anxious.  Sweating.  Pain and swelling in a leg. This is a symptom of DVT, which can lead to PE.  How is this diagnosed? This condition may be diagnosed based on:  Your medical history.  A physical exam.  Blood tests to check blood oxygen level and how well your blood clots, and a D-dimer blood test, which checks your blood for a substance that is released when a blood clot breaks apart.  CT pulmonary angiogram. This test checks blood flow in and around your lungs.  Ventilation-perfusion scan, also called a lung VQ scan. This test measures air flow and blood flow to the lungs.  Ultrasound of the legs to look for blood clots.  How is this treated? Treatment for this conditions depends on many factors, such as the cause of your PE, your risk for bleeding or developing more clots, and other medical conditions you have. Treatment aims to remove, dissolve, or stop blood clots from forming or growing larger. Treatment may include:  Blood thinning medicines (anticoagulants) to stop clots from forming or growing. These medicines may be given as a pill, as an injection, or through an IV tube (infusion).  Medicines that dissolve clots (thrombolytics).  A procedure in which a flexible tube is used to remove a blood clot (embolectomy) or deliver medicine to destroy it (catheter-directed thrombolysis).  A procedure in which a filter is inserted into a large vein that carries blood to the heart (inferior vena cava). This filter (vena cava filter) catches blood clots before they reach the lungs.  Surgery to remove the clot (surgical embolectomy). This is rare.  You may need a  combination of immediate, long-term (up to 3 months after diagnosis), and extended (more than 3 months after diagnosis) treatments. Your treatment may continue for several months (maintenance therapy). You and your health care provider will work  together to choose the treatment program that is best for you. Follow these instructions at home: If you are taking an anticoagulant medicine:  Take the medicine every day at the same time each day.  Understand what foods and drugs interact with your medicine.  Understand the side effects of this medicine, including excessive bruising or bleeding. Ask your health care provider or pharmacist about other side effects. General instructions  Take over-the-counter and prescription medicines only as told by your health care provider.  Anticoagulant medicines may cause side effects, including easy bruising and difficulty stopping bleeding. If you are prescribed an anticoagulant: ? Hold pressure over cuts for longer than usual. ? Tell your dentist and other health care providers that you are taking anticoagulants before you have any procedure that may cause bleeding. ? Avoid contact sports. ? Be extra careful when handling sharp objects. ? Use a soft toothbrush. Floss with waxed dental floss. ? Shave with an Copy.  Wear a medical alert bracelet or carry a medical alert card that says you have had a PE.  Ask your health care provider when you may return to your normal activities.  Talk with your health care provider about any travel plans. It is important to make sure that you are still able to take your medicine while on trips.  Keep all follow-up visits as told by your health care provider. This is important. How is this prevented? Take these actions to lower your risk of developing another PE:  Exercise regularly. Take frequent walks. For at least 30 minutes every day, engage in: ? Activity that involves moving your arms and  legs. ? Activity that encourages good blood flow through your body by increasing your heart rate.  While traveling, drink plenty of water and avoid drinking alcohol. Ask your health care provider if you should wear below-the-knee compression stockings.  Avoid sitting or lying in bed for long periods of time without moving your legs. Exercise your arms and legs every hour during long-distance travel (over 4 hours).  If you are hospitalized or have surgery, ask your health care provider about your risks and what treatments can help prevent blood clots.  Maintain a healthy weight. Ask your health care provider what weight is healthy for you.  If you are a woman who is over age 26, avoid unnecessary use of medicines that contain estrogen, including birth control pills.  Do not use any products that contain nicotine or tobacco, such as cigarettes and e-cigarettes. This is especially important if you take estrogen medicines. If you need help quitting, ask your health care provider.  See your health care provider for regular checkups. This may include blood tests and ultrasound testing on your legs to check for new blood clots.  Contact a health care provider if:  You missed a dose of your blood thinner medicine. Get help right away if:  You have new or increased pain, swelling, warmth, or redness in an arm or leg.  You have numbness or tingling in an arm or leg.  You have shortness of breath while active or at rest.  You have chest pain.  You have a rapid or irregular heartbeat.  You feel light-headed or dizzy.  You cough up blood.  You have blood in your vomit, stool, or urine.  You have a fever.  You have abdomen (abdominal) pain.  You have a severe fall or head injury.  You have a severe headache.  You have vision changes.  You  cannot move your arms or legs.  You are confused or have memory loss.  You are bleeding for 10 minutes or more, even with strong pressure on  the wound. These symptoms may represent a serious problem that is an emergency. Do not wait to see if the symptoms will go away. Get medical help right away. Call your local emergency services (911 in the [place] Do not drive yourself to the hospital. Summary  A pulmonary embolism (PE) is a sudden blockage or decrease of blood flow in one lung or both lungs. PE is a dangerous and life-threatening condition that needs to be treated right away.  Having deep vein thrombosis (DVT) or a history of DVT is the most common risk factor for PE.  Treatments for this condition usually include medicines to thin your blood (anticoagulants) or medicines to break apart blood clots (thrombolytics).  If you are prescribed blood thinners, it is important to take the medicine every single day at the same time each day.  If you have signs of PE or DVT, call your local emergency services (911 in the [place] This information is not intended to replace advice given to you by your health care provider. Make sure you discuss any questions you have with your health care provider. Document Released: 04/24/2000 Document Revised: 05/30/2016 Document Reviewed: 05/30/2016 Elsevier Interactive Patient Education  2018 [employer].   Rivaroxaban oral tablets What is this medicine? RIVAROXABAN (ri va ROX a ban) is an anticoagulant (blood thinner). It is used to treat blood clots in the lungs or in the veins. It is also used after knee or hip surgeries to prevent blood clots. It is also used to lower the chance of stroke in people with a medical condition called atrial fibrillation. This medicine may be used for other purposes; ask your health care provider or pharmacist if you have questions. COMMON BRAND NAME(S): Xarelto, Xarelto Starter Pack What should I tell my health care provider before I take this medicine? They need to know if you have any of these conditions: -bleeding disorders -bleeding in the brain -blood in your  stools (black or tarry stools) or if you have blood in your vomit -history of stomach bleeding -kidney disease -liver disease -low blood counts, like low white cell, platelet, or red cell counts -recent or planned spinal or epidural procedure -take medicines that treat or prevent blood clots -an unusual or allergic reaction to rivaroxaban, other medicines, foods, dyes, or preservatives -pregnant or trying to get pregnant -breast-feeding How should I use this medicine? Take this medicine by mouth with a glass of water. Follow the directions on the prescription label. Take your medicine at regular intervals. Do not take it more often than directed. Do not stop taking except on your doctor's advice. Stopping this medicine may increase your risk of a blood clot. Be sure to refill your prescription before you run out of medicine. If you are taking this medicine after hip or knee replacement surgery, take it with or without food. If you are taking this medicine for atrial fibrillation, take it with your evening meal. If you are taking this medicine to treat blood clots, take it with food at the same time each day. If you are unable to swallow your tablet, you may crush the tablet and mix it in applesauce. Then, immediately eat the applesauce. You should eat more food right after you eat the applesauce containing the crushed tablet. Talk to your pediatrician regarding the use of this medicine  in children. Special care may be needed. Overdosage: If you think you have taken too much of this medicine contact a poison control center or emergency room at once. NOTE: This medicine is only for you. Do not share this medicine with others. What if I miss a dose? If you take your medicine once a day and miss a dose, take the missed dose as soon as you remember. If you take your medicine twice a day and miss a dose, take the missed dose immediately. In this instance, 2 tablets may be taken at the same time. The next  day you should take 1 tablet twice a day as directed. What may interact with this medicine? Do not take this medicine with any of the following medications: -defibrotide This medicine may also interact with the following medications: -aspirin and aspirin-like medicines -certain antibiotics like erythromycin, azithromycin, and clarithromycin -certain medicines for fungal infections like ketoconazole and itraconazole -certain medicines for irregular heart beat like amiodarone, quinidine, dronedarone -certain medicines for seizures like carbamazepine, phenytoin -certain medicines that treat or prevent blood clots like warfarin, enoxaparin, and dalteparin -conivaptan -diltiazem -felodipine -indinavir -lopinavir; ritonavir -NSAIDS, medicines for pain and inflammation, like ibuprofen or naproxen -ranolazine -rifampin -ritonavir -SNRIs, medicines for depression, like desvenlafaxine, duloxetine, levomilnacipran, venlafaxine -SSRIs, medicines for depression, like citalopram, escitalopram, fluoxetine, fluvoxamine, paroxetine, sertraline -St. John's wort -verapamil This list may not describe all possible interactions. Give your health care provider a list of all the medicines, herbs, non-prescription drugs, or dietary supplements you use. Also tell them if you smoke, drink alcohol, or use illegal drugs. Some items may interact with your medicine. What should I watch for while using this medicine? Visit your doctor or health care professional for regular checks on your progress. Notify your doctor or health care professional and seek emergency treatment if you develop breathing problems; changes in vision; chest pain; severe, sudden headache; pain, swelling, warmth in the leg; trouble speaking; sudden numbness or weakness of the face, arm or leg. These can be signs that your condition has gotten worse. If you are going to have surgery or other procedure, tell your doctor that you are taking this  medicine. What side effects may I notice from receiving this medicine? Side effects that you should report to your doctor or health care professional as soon as possible: -allergic reactions like skin rash, itching or hives, swelling of the face, lips, or tongue -back pain -redness, blistering, peeling or loosening of the skin, including inside the mouth -signs and symptoms of bleeding such as bloody or black, tarry stools; red or dark-brown urine; spitting up blood or brown material that looks like coffee grounds; red spots on the skin; unusual bruising or bleeding from the eye, gums, or nose Side effects that usually do not require medical attention (report to your doctor or health care professional if they continue or are bothersome): -dizziness -muscle pain This list may not describe all possible side effects. Call your doctor for medical advice about side effects. You may report side effects to FDA at 1-800-FDA-1088. Where should I keep my medicine? Keep out of the reach of children. Store at room temperature between 15 and 30 degrees C (59 and 86 degrees F). Throw away any unused medicine after the expiration date. NOTE: This sheet is a summary. It may not cover all possible information. If you have questions about this medicine, talk to your doctor, pharmacist, or health care provider.  2018 [employer] (2016-01-15 16:29:33)

## 2018-02-13 NOTE — Discharge Summary (Signed)
Physician Discharge Summary  James [name] MVH:[id number] DOB: 1950-08-01  PCP: [name], MD  Admit date: 02/06/2018 Discharge date: 02/13/2018  Recommendations for Outpatient Follow-up:  1. [name], PCP in 2 days with repeat labs (CBC & BMP). 2. [name], Urology in 2 weeks 3. [name], Medical Oncology 4. Dr. Peter Martinique, Cardiology  Home Health: None Equipment/Devices: None  Discharge Condition: Improved and stable CODE STATUS: Full Diet recommendation: Heart healthy diet.  Discharge Diagnoses:  Active Problems:   OSA on CPAP   Adenocarcinoma of right lung, stage 1 (HCC)   Acute massive pulmonary embolism [name])   Brief Summary: 67 year old married male with PMH of stage 1A NSCLC (Adenocarcinoma) status post resection 09/02/2016 and on Tagrisso since 11/05/2017, OSA, asthma ([name]) anxiety, depression, chronic diastolic CHF, DM 2, HTN, CAD, mitral regurgitation status post MV repair November 2018 ([name] & [name], cardiology), GERD presented to ED on 02/06/2018 with dyspnea, found to have submassive PE with RV: LV ratio 1.7 and admitted to ICU by CCM.  IR consulted for EKOS.  Stabilized and transferred to [facility] on 10/2.  Hospital course complicated by left flank pain, hematuria and bleeding from left renal cyst, right leg pain and swelling due to hematoma.  Urology and orthopedics consulted.   Assessment & Plan:   Acute submassive PE with RV strain s/p EKOS/chronic lower extremity DVT: After EKOS with bilateral intra-arterial TPA, patient completed 6 days of IV heparin. The original plan was to transition to Xarelto after 5 days of IV heparin.  However he developed left flank pain, hematuria and right leg painful swelling of undetermined etiology which were being worked up.  Hence IV heparin was continued.  Both Urology and OIrthopedics evaluated and cleared for transitioning to [place].  Xarelto was started on 02/12/2018.  Hematuria  decreasing.  Right leg pain also improved.  Duration of anticoagulation may be indefinite but deferred to his outpatient Oncologist and or PCP for definitive duration.   Since patient was started on Xarelto, aspirin discontinued to minimize bleeding risk.  Right leg pain and swelling/hematoma: Had complaints since 10/3 and progressively worsened.?  Swelling noted over right lateral mid calf.  Lower extremity venous Doppler repeated >positive for chronic DVT in the peroneal veins, no Baker's cyst but shows a well circumcised anechoic area of the mid posterior calf with posterior acoustic enhancement measuring 2 cm, suggestive of possible cyst versus unknown etiology.  CT of right leg with contrast: Ill-defined low-attenuation focus in the right soleus not suggestive of hematoma and could be due to ganglion cyst, focal inflammatory change or myxoma.  Due to persistent pain especially on weightbearing, not knowing etiology of abnormal imaging findings and physical exam,  orthopedics was consulted who advised obtaining MRI of right leg.  MRI of right leg today confirms hematoma.  I discussed with reading radiologist who indicates that he may have approximately 29 mL size hematoma.  Symptomatically much improved with improved pain control, decreased swelling and able to weight-bear.  Left flank pain/gross hematuria: Left flank pain and gross hematuria since 10/3.  CT abdomen and pelvis with contrast 10/4: Suggestive of hemorrhage into renal cysts.    Urology was consulted and indicated that he likely bled into renal cysts on the left.  He will need cystoscopy and elective/outpatient setting to confirm no lower tract etiologies.  His hematuria has improved today but advised patient and family that this may wax and wane.  No significant postvoid residuals  indicating that he is adequately emptying his bladder.    Patient has been advised regarding aggressive oral hydration and he has been advised regarding warning  symptoms for orthostasis, symptomatic anemia or clot retention.  As per today's follow-up, urology indicates that hematuria is dark and consistent with resolving prior bleed and no bright red blood to suggest significant active bleeding.  Urology has cleared him for discharge home.    Left Peri-Pelvic Renal Cysts/left bifid kidney: Per Urology.  Prostate screening said to be up-to-date this year.  Acute asthma exacerbation: Reportedly has trouble with chest discomfort and throat irritation from Symbicort as outpatient.  Acute exacerbation resolved.  Stable.  History of OSA: Continue CPAP nightly.  Chronic diastolic CHF: Euvolemic.  Patient only uses as needed Lasix at home.  Essential hypertension: Controlled.  Continue metoprolol.  CAD: Continue metoprolol, Lipitor.  Aspirin discontinued as stated above.    Status post MV repair: Stable.  Stage 1A NSCLC: Status post resection.  Follows with [name].  Continue Tagrisso.  History of anxiety and depression: Stable.  Spinal stenosis: No pain reported.  Chronic thrombocytopenia: Resolved.  Acute on stage II-III chronic kidney disease:  Patient's baseline creatinine probably in the 1-10.2 range.  Creatinine had normalized to 1.04 on 10/3 but has gradually increased to 1.32 yesterday and 1.45 today.  This may be related to hematuria/bleeding into left renal cysts and patient also received contrast for CT abdomen.  No urinary retention.  Patient counseled regarding adequate hydration.  Close follow-up of BMP in the next 2 days as outpatient.  Hopefully this will resolve.  Constipation: Continue bowel regimen.  Acute blood loss anemia: Hemoglobin has gradually drifted down from 14-15 range early on in admission to 11.3.  Likely multifactorial due to hematuria/bleeding into left renal cyst, hematoma of right leg, some bruising of right upper extremity and groin, blood draws.  No absolute contraindications at this time to  hold anticoagulation especially given recent acute submassive PE.  Asymptomatic.  Recommend close follow-up of CBC in the next 48 hours from discharge.  Patient and spouse verbalized understanding.    Consultants:  [facility] IR Orthopedics  Procedures:  CT angio chest 9/29 >> b/l PE with RV:LV ratio 1.7, mod Rt effusion, 4 mm nodule RUL, 4 mm nodule RLL, 5 mm nodule LUL (reviewed by me) Doppler legs 9/30 >> chronic DVT Rt peroneal, Lt femoral, Lt popliteal, Lt posterior tibial, Lt peroneal veins Echo 9/30 >> mild LVH, EF 65 to 70%, s/p MV repair, mild RV dilation  IR 9/29 PE for EKOS >> Sheaths for EKOS 9/29 >>9/30   Discharge Instructions  Discharge Instructions    Call MD for:   Complete by:  As directed    Worsening blood in the urine.   Call MD for:  difficulty breathing, headache or visual disturbances   Complete by:  As directed    Call MD for:  extreme fatigue   Complete by:  As directed    Call MD for:  persistant dizziness or light-headedness   Complete by:  As directed    Call MD for:  persistant nausea and vomiting   Complete by:  As directed    Call MD for:  severe uncontrolled pain   Complete by:  As directed    Call MD for:  temperature >100.4   Complete by:  As directed    Diet - low sodium heart healthy   Complete by:  As directed    Increase activity slowly  Complete by:  As directed        Medication List    STOP taking these medications   aspirin EC 81 MG tablet     TAKE these medications   acetaminophen 325 MG tablet Commonly known as:  TYLENOL Take 650 mg by mouth every 6 (six) hours as needed for moderate pain or headache.   atorvastatin 20 MG tablet Commonly known as:  LIPITOR TAKE 1 TABLET (20 MG TOTAL) BY MOUTH DAILY.   bisacodyl 10 MG suppository Commonly known as:  DULCOLAX Place 1 suppository (10 mg total) rectally daily as needed for moderate constipation.   docusate sodium 100 MG capsule Commonly known as:  COLACE Take 1  capsule (100 mg total) by mouth 2 (two) times daily.   Fish Oil 500 MG Caps Take 500 mg by mouth daily.   furosemide 40 MG tablet Commonly known as:  LASIX Take 1 tablet (40 mg total) by mouth as needed for fluid.   metoprolol tartrate 25 MG tablet Commonly known as:  LOPRESSOR TAKE 1/2 TABLET BY MOUTH 2 TIMES DAILY What changed:  See the new instructions.   polyethylene glycol packet Commonly known as:  MIRALAX / GLYCOLAX Take 17 g by mouth 2 (two) times daily.   potassium chloride SA 20 MEQ tablet Commonly known as:  K-DUR,KLOR-CON Take 1 tablet (20 mEq total) by mouth daily as needed. With lasix   Rivaroxaban 15 & 20 MG Tbpk Take as directed on package: Start with one 15mg  tablet by mouth twice a day with food. On Day 22, switch to one 20mg  tablet once a day with food. PHARMACY: Please discard the first 2 tablets from the starter pack which he received in the hospital.   sodium chloride 0.65 % Soln nasal spray Commonly known as:  OCEAN Place 1 spray into both nostrils as needed for congestion.   TAGRISSO 80 MG tablet Generic drug:  osimertinib mesylate TAKE 1 TABLET (80 MG TOTAL) BY MOUTH DAILY. What changed:  See the new instructions.   traMADol 50 MG tablet Commonly known as:  ULTRAM Take 1 tablet (50 mg total) by mouth every 6 (six) hours as needed (may take one or two tablets every six hrs prn).      Follow-up Information    [name], MD. Schedule an appointment as soon as possible for a visit in 2 day(s).   Specialty:  Internal Medicine Why:  To be seen with repeat labs (CBC & BMP). Contact information: [address] [place] [address] [phone]        Martinique, Peter M, MD .   Specialty:  Cardiology Contact information: [address] [place] [address] [phone]        Alexis Frock, MD. Schedule an appointment as soon as possible for a visit in 2 week(s).   Specialty:  Urology Contact  information: [place] [address] [phone]        Curt Bears, MD. Schedule an appointment as soon as possible for a visit.   Specialty:  Oncology Contact information: [address] [place] [address] [phone]          Allergies  Allergen Reactions  . Symbicort [Budesonide-Formoterol Fumarate] Other (See Comments) and Cough    Chest pain, wheezing   . Amoxicillin Itching and Other (See Comments)    Has patient had a PCN reaction causing immediate rash, facial/tongue/throat swelling, SOB or lightheadedness with hypotension: No Has patient  had a PCN reaction causing severe rash involving mucus membranes or skin necrosis: No Has patient had a PCN reaction that required hospitalization No Has patient had a PCN reaction occurring within the last 10 years: No If all of the above answers are "NO", then may proceed with Cephalosporin use.       Procedures/Studies: Ct Abdomen Pelvis W Wo Contrast  Result Date: 02/11/2018 CLINICAL DATA:  Hematuria.  History of lung cancer and PE. EXAM: CT ABDOMEN AND PELVIS WITHOUT AND WITH CONTRAST TECHNIQUE: Multidetector CT imaging of the abdomen and pelvis was performed following the standard protocol before and following the bolus administration of intravenous contrast. CONTRAST:  144mL OMNIPAQUE IOHEXOL 350 MG/ML SOLN COMPARISON:  01/03/2018 FINDINGS: Lower chest: Small right pleural effusion. Hepatobiliary: 4.6 cm cyst identified posterior right liver. 2.3 cm apparent cyst identified in the lateral segment left liver. There is no evidence for gallstones, gallbladder wall thickening, or pericholecystic fluid. No intrahepatic or extrahepatic biliary dilation. Pancreas: No focal mass lesion. No dilatation of the main duct. No intraparenchymal cyst. No peripancreatic edema. Spleen: No splenomegaly. No focal mass lesion. Adrenals/Urinary Tract: No adrenal nodule or mass. Right kidney unremarkable. Right ureter  unremarkable. Previous CT showed multiple central sinus cysts in the left kidney. Many of these central sinus cysts have increased substantially in attenuation in the interval compatible with hemorrhage there is left perinephric edema. No left hydroureter although there is left periureteric edema. Stomach/Bowel: Stomach is nondistended. No gastric wall thickening. No evidence of outlet obstruction. Duodenum is normally positioned as is the ligament of Treitz. No small bowel wall thickening. No small bowel dilatation. The terminal ileum is normal. The appendix is not visualized, but there is no edema or inflammation in the region of the cecum. No gross colonic mass. No colonic wall thickening. No substantial diverticular change. Vascular/Lymphatic: There is abdominal aortic atherosclerosis without aneurysm. There is no gastrohepatic or hepatoduodenal ligament lymphadenopathy. No intraperitoneal or retroperitoneal lymphadenopathy. No pelvic sidewall lymphadenopathy. Reproductive: Prostate gland is enlarged. Other: No intraperitoneal free fluid. Musculoskeletal: Stranding in the right groin region may be from recent vascular catheterization. Patient is status post left hip replacement. IMPRESSION: 1. Multiple central sinus cysts in the left kidney have increased in attenuation since the prior study. Imaging features would be compatible with hemorrhage into the cyst and potentially into the left intrarenal collecting system. Etiology for the hemorrhage not evident but underlying nonvisualized urothelial lesion a consideration. 2. Stranding in the right groin, potentially from recent vascular access procedure. 3. Hepatic cysts. 4.  Aortic Atherosclerois (ICD10-170.0) 5. Small right pleural effusion. Electronically Signed   By: Misty Stanley M.D.   On: 02/11/2018 18:43   Ct Head Wo Contrast  Result Date: 02/06/2018 CLINICAL DATA:  Lung cancer.  Pre tPA. EXAM: CT HEAD WITHOUT CONTRAST TECHNIQUE: Contiguous axial images  were obtained from the base of the skull through the vertex without intravenous contrast. COMPARISON:  10/26/2017 FINDINGS: Brain: No mass effect, midline shift, or acute hemorrhage. Vascular: No hyperdense vessel or unexpected calcification. Skull: Intact. Sinuses/Orbits: No acute finding. Other: Noncontributory IMPRESSION: No acute intracranial pathology. Electronically Signed   By: Marybelle Killings M.D.   On: 02/06/2018 17:03   Ct Angio Chest Pe W And/or Wo Contrast  Result Date: 02/06/2018 CLINICAL DATA:  Shortness of breath.  History of lung carcinoma EXAM: CT ANGIOGRAPHY CHEST WITH CONTRAST TECHNIQUE: Multidetector CT imaging of the chest was performed using the standard protocol during bolus administration of intravenous contrast. Multiplanar CT image reconstructions  and MIPs were obtained to evaluate the vascular anatomy. CONTRAST:  100 mL ISOVUE-370 IOPAMIDOL (ISOVUE-370) INJECTION 76% COMPARISON:  Chest CT January 03, 2018; chest radiograph February 06, 2018. FINDINGS: Cardiovascular: There is extensive pulmonary embolism arising from the main pulmonary arteries bilaterally and extending into multiple upper and lower lobe pulmonary artery branches. The right ventricle to left ventricle diameter ratio is 1.7, consistent with right heart strain. There is no thoracic aortic aneurysm or dissection. The visualized great vessels appear unremarkable. No pericardial effusion or pericardial thickening. The main pulmonary outflow tract measures 3.1 cm which is prominent. Mediastinum/Nodes: Thyroid appears unremarkable. There is no appreciable thoracic adenopathy. No esophageal lesions are evident. Lungs/Pleura: There is a moderate pleural effusion on the right with atelectatic change in the right base. There are areas of scarring on the right. There is a small granuloma in the right upper lobe. There is a stable 4 mm nodular opacity in the posterior segment right upper lobe seen on axial slice 50 series 7. There is  a stable 4 mm nodular opacity in the superior segment of the right lower lobe seen on axial slice 51 series 7. There is left base atelectasis. There is no edema or consolidation on the left. There is a stable 5 mm nodular opacity in the posterior segment left upper lobe seen on axial slice 26 series 7. No pleural effusion on the left evident. Upper Abdomen: There is reflux of contrast into the inferior vena cava and hepatic veins. There is a cyst arising in the posterior segment right lobe of the liver measuring 4.4 x 3.9 cm. Visualized upper abdominal structures otherwise appear unremarkable. Musculoskeletal: Patient is status post median sternotomy. There is degenerative change in the thoracic spine with diffuse idiopathic skeletal hyperostosis. There are no appreciable blastic or lytic bone lesions. No chest wall lesions are evident. Review of the MIP images confirms the above findings. IMPRESSION: 1. Positive for acute PE with CT evidence of right heart strain (RV/LV Ratio = 1.7) consistent with at least submassive (intermediate risk) PE. The presence of right heart strain has been associated with an increased risk of morbidity and mortality. Please activate Code PE by paging [phone]. Pulmonary emboli arise in each main pulmonary artery with extension into multiple upper and lower lobe branches. 2.  No thoracic aortic aneurysm or dissection. 3. Prominence of the main pulmonary outflow tract, a finding felt to be indicative of a degree of pulmonary arterial hypertension. 4. Moderate pleural effusion on the right with right base atelectasis. Areas of scarring and volume loss the right. Left base atelectasis. No edema or consolidation on the left. Subcentimeter nodular opacities remain as noted above, stable. 5.  No demonstrable thoracic adenopathy. 6. Reflux of contrast into the inferior vena cava and hepatic veins, a finding felt to be indicative of increase in right heart pressure. 7.  Diffuse idiopathic  skeletal hyperostosis in the thoracic spine. Critical Value/emergent results were called by telephone at the time of interpretation on 02/06/2018 at 1:25 pm to Dr. Julianne Rice , who verbally acknowledged these results. Electronically Signed   By: Lowella Grip III M.D.   On: 02/06/2018 13:26   Ir Angiogram Pulmonary Bilateral Selective  Result Date: 02/08/2018 INDICATION: Sub massive pulmonary thromboembolism EXAM: PULMONARY ARTERY LYSIS COMPARISON:  None MEDICATIONS: None. ANESTHESIA/SEDATION: Versed 2 mg IV; Fentanyl 100 mcg IV Moderate Sedation Time:  58 minutes The patient was continuously monitored during the procedure by the interventional radiology nurse under my direct supervision. FLUOROSCOPY TIME:  Fluoroscopy Time: 10 minutes 18 seconds (60 mGy). COMPLICATIONS: None immediate. TECHNIQUE: Informed written consent was obtained from the patient after a thorough discussion of the procedural risks, benefits and alternatives. All questions were addressed. Maximal Sterile Barrier Technique was utilized including caps, mask, sterile gowns, sterile gloves, sterile drape, hand hygiene and skin antiseptic. A timeout was performed prior to the initiation of the procedure. The right groin was prepped and draped in a sterile fashion. 1% lidocaine was utilized for local anesthesia. Under sonographic guidance, a micropuncture needle was inserted into the right common femoral vein and removed over a 018 wire. Wire was up sized to a Bentson. A 7 French sheath was inserted. An additional 7 French sheath was placed in a similar fashion in the right common femoral vein. Sonographic documentation was obtained. The common femoral vein was noted to be patent by sonographic imaging. A vertebral catheter was advanced over the Bentson wire to the right atrium. It was then advanced over a glidewire into the main pulmonary artery. Pulmonary artery pressure was obtained at 47/22 with a mean of 32 mm Hg. The catheter was  advanced over the glidewire to the distal right pulmonary artery. Pulmonary arteriography was performed. The vertebral catheter was exchanged over a rose in wire for a 12 cm EKOS pulmonary artery infusion catheter. The vertebral catheter was then advanced over a Bentson wire through the second sheath to the right atrium. It was then advanced over a glidewire in the distal left pulmonary artery. Contrast was injected for pulmonary arteriography. It was then exchanged over a rose in wire for a 12 cm EKOS infusion catheter. The sheaths and catheters were sewn in place. Lysis was instituted with tPA at 1 milligram/hour. FINDINGS: Bilateral pulmonary arteriography confirms proper position of the right and left pulmonary artery catheter is within the pulmonary arterial tree. Final imaging demonstrates appropriate position of the bilateral infusion catheters within the right and left pulmonary arteries. IMPRESSION: Successful institution of EKOS pulmonary artery lysis for submassive pulmonary thromboembolism. Electronically Signed   By: Marybelle Killings M.D.   On: 02/08/2018 08:41   Ir Angiogram Selective Each Additional Vessel  Result Date: 02/08/2018 INDICATION: Sub massive pulmonary thromboembolism EXAM: PULMONARY ARTERY LYSIS COMPARISON:  None MEDICATIONS: None. ANESTHESIA/SEDATION: Versed 2 mg IV; Fentanyl 100 mcg IV Moderate Sedation Time:  58 minutes The patient was continuously monitored during the procedure by the interventional radiology nurse under my direct supervision. FLUOROSCOPY TIME:  Fluoroscopy Time: 10 minutes 18 seconds (60 mGy). COMPLICATIONS: None immediate. TECHNIQUE: Informed written consent was obtained from the patient after a thorough discussion of the procedural risks, benefits and alternatives. All questions were addressed. Maximal Sterile Barrier Technique was utilized including caps, mask, sterile gowns, sterile gloves, sterile drape, hand hygiene and skin antiseptic. A timeout was performed  prior to the initiation of the procedure. The right groin was prepped and draped in a sterile fashion. 1% lidocaine was utilized for local anesthesia. Under sonographic guidance, a micropuncture needle was inserted into the right common femoral vein and removed over a 018 wire. Wire was up sized to a Bentson. A 7 French sheath was inserted. An additional 7 French sheath was placed in a similar fashion in the right common femoral vein. Sonographic documentation was obtained. The common femoral vein was noted to be patent by sonographic imaging. A vertebral catheter was advanced over the Bentson wire to the right atrium. It was then advanced over a glidewire into the main pulmonary artery. Pulmonary artery pressure was  obtained at 47/22 with a mean of 32 mm Hg. The catheter was advanced over the glidewire to the distal right pulmonary artery. Pulmonary arteriography was performed. The vertebral catheter was exchanged over a rose in wire for a 12 cm EKOS pulmonary artery infusion catheter. The vertebral catheter was then advanced over a Bentson wire through the second sheath to the right atrium. It was then advanced over a glidewire in the distal left pulmonary artery. Contrast was injected for pulmonary arteriography. It was then exchanged over a rose in wire for a 12 cm EKOS infusion catheter. The sheaths and catheters were sewn in place. Lysis was instituted with tPA at 1 milligram/hour. FINDINGS: Bilateral pulmonary arteriography confirms proper position of the right and left pulmonary artery catheter is within the pulmonary arterial tree. Final imaging demonstrates appropriate position of the bilateral infusion catheters within the right and left pulmonary arteries. IMPRESSION: Successful institution of EKOS pulmonary artery lysis for submassive pulmonary thromboembolism. Electronically Signed   By: Marybelle Killings M.D.   On: 02/08/2018 08:41   Ir Angiogram Selective Each Additional Vessel  Result Date:  02/08/2018 INDICATION: Sub massive pulmonary thromboembolism EXAM: PULMONARY ARTERY LYSIS COMPARISON:  None MEDICATIONS: None. ANESTHESIA/SEDATION: Versed 2 mg IV; Fentanyl 100 mcg IV Moderate Sedation Time:  58 minutes The patient was continuously monitored during the procedure by the interventional radiology nurse under my direct supervision. FLUOROSCOPY TIME:  Fluoroscopy Time: 10 minutes 18 seconds (60 mGy). COMPLICATIONS: None immediate. TECHNIQUE: Informed written consent was obtained from the patient after a thorough discussion of the procedural risks, benefits and alternatives. All questions were addressed. Maximal Sterile Barrier Technique was utilized including caps, mask, sterile gowns, sterile gloves, sterile drape, hand hygiene and skin antiseptic. A timeout was performed prior to the initiation of the procedure. The right groin was prepped and draped in a sterile fashion. 1% lidocaine was utilized for local anesthesia. Under sonographic guidance, a micropuncture needle was inserted into the right common femoral vein and removed over a 018 wire. Wire was up sized to a Bentson. A 7 French sheath was inserted. An additional 7 French sheath was placed in a similar fashion in the right common femoral vein. Sonographic documentation was obtained. The common femoral vein was noted to be patent by sonographic imaging. A vertebral catheter was advanced over the Bentson wire to the right atrium. It was then advanced over a glidewire into the main pulmonary artery. Pulmonary artery pressure was obtained at 47/22 with a mean of 32 mm Hg. The catheter was advanced over the glidewire to the distal right pulmonary artery. Pulmonary arteriography was performed. The vertebral catheter was exchanged over a rose in wire for a 12 cm EKOS pulmonary artery infusion catheter. The vertebral catheter was then advanced over a Bentson wire through the second sheath to the right atrium. It was then advanced over a glidewire in  the distal left pulmonary artery. Contrast was injected for pulmonary arteriography. It was then exchanged over a rose in wire for a 12 cm EKOS infusion catheter. The sheaths and catheters were sewn in place. Lysis was instituted with tPA at 1 milligram/hour. FINDINGS: Bilateral pulmonary arteriography confirms proper position of the right and left pulmonary artery catheter is within the pulmonary arterial tree. Final imaging demonstrates appropriate position of the bilateral infusion catheters within the right and left pulmonary arteries. IMPRESSION: Successful institution of EKOS pulmonary artery lysis for submassive pulmonary thromboembolism. Electronically Signed   By: Marybelle Killings M.D.   On: 02/08/2018 08:41  Ct Tibia Fibula Right W Contrast  Result Date: 02/11/2018 CLINICAL DATA:  Right lower leg pain. Chest CT 02/06/2018 positive for pulmonary embolus. Possible cyst in the right lower leg on Doppler ultrasound. EXAM: CT OF THE LOWER RIGHT EXTREMITY WITH CONTRAST TECHNIQUE: Multidetector CT imaging of the lower right extremity was performed according to the standard protocol following intravenous contrast administration. COMPARISON:  None. CONTRAST:  125 mL OMNIPAQUE IOHEXOL 350 MG/ML SOLN FINDINGS: Bones/Joint/Cartilage No acute or focal abnormality is identified. Mild degenerative change is present about the knee. Ligaments Suboptimally assessed by CT. Muscles and Tendons Intact. An ill-defined low-attenuation area in the lateral soleus measuring 3.1 cm craniocaudal by 1.7 cm AP x 2.1 cm transverse is identified. Soft tissues Subcutaneous edema in the lower leg is more intense distally and laterally. Vascular structures are unremarkable in appearance. IMPRESSION: Ill-defined low-attenuation focus in the right soleus is not suggestive of a hematoma and could be due to a ganglion cyst, focal inflammatory change or myxoma. Subcutaneous edema about the lower leg is most intense distally and laterally.  Electronically Signed   By: Inge Rise M.D.   On: 02/11/2018 18:44   Mr Tibia Fibula Right W Wo Contrast  Result Date: 02/13/2018 CLINICAL DATA:  Severe right lower leg pain and swelling with a cystic lesion seen in the left soleus muscle on prior CT. EXAM: MRI OF LOWER RIGHT EXTREMITY WITHOUT AND WITH CONTRAST TECHNIQUE: Multiplanar, multisequence MR imaging of the right lower extremity was performed both before and after administration of intravenous contrast. CONTRAST:  10 cc Gadavist IV. COMPARISON:  CT right lower leg 02/11/2018. FINDINGS: Bones/Joint/Cartilage Marrow signal is normal throughout without fracture, stress change or focal lesion. Ligaments Negative. Muscles and Tendons There is a collection in the right soleus muscle measuring 3.2 cm AP x 2.6 cm transverse x 6.9 cm craniocaudal. The collection is T2 hyperintense with T1 hyperintensity layering dependently consistent with a hematoma. It does not enhance. There is edema and mild enhancement in the adjacent soleus muscle. The examination is otherwise negative. Soft tissues Negative. IMPRESSION: A cystic lesion the right soleus muscle seen on prior examination is a hematoma. No evidence of neoplastic process is identified. Mild edema and enhancement in the adjacent soleus muscle are noted. Electronically Signed   By: Inge Rise M.D.   On: 02/13/2018 12:47   Ir US Guide Vasc Access Right  Result Date: 02/08/2018 INDICATION: Sub massive pulmonary thromboembolism EXAM: PULMONARY ARTERY LYSIS COMPARISON:  None MEDICATIONS: None. ANESTHESIA/SEDATION: Versed 2 mg IV; Fentanyl 100 mcg IV Moderate Sedation Time:  58 minutes The patient was continuously monitored during the procedure by the interventional radiology nurse under my direct supervision. FLUOROSCOPY TIME:  Fluoroscopy Time: 10 minutes 18 seconds (60 mGy). COMPLICATIONS: None immediate. TECHNIQUE: Informed written consent was obtained from the patient after a thorough discussion  of the procedural risks, benefits and alternatives. All questions were addressed. Maximal Sterile Barrier Technique was utilized including caps, mask, sterile gowns, sterile gloves, sterile drape, hand hygiene and skin antiseptic. A timeout was performed prior to the initiation of the procedure. The right groin was prepped and draped in a sterile fashion. 1% lidocaine was utilized for local anesthesia. Under sonographic guidance, a micropuncture needle was inserted into the right common femoral vein and removed over a 018 wire. Wire was up sized to a Bentson. A 7 French sheath was inserted. An additional 7 French sheath was placed in a similar fashion in the right common femoral vein. Sonographic documentation was obtained.  The common femoral vein was noted to be patent by sonographic imaging. A vertebral catheter was advanced over the Bentson wire to the right atrium. It was then advanced over a glidewire into the main pulmonary artery. Pulmonary artery pressure was obtained at 47/22 with a mean of 32 mm Hg. The catheter was advanced over the glidewire to the distal right pulmonary artery. Pulmonary arteriography was performed. The vertebral catheter was exchanged over a rose in wire for a 12 cm EKOS pulmonary artery infusion catheter. The vertebral catheter was then advanced over a Bentson wire through the second sheath to the right atrium. It was then advanced over a glidewire in the distal left pulmonary artery. Contrast was injected for pulmonary arteriography. It was then exchanged over a rose in wire for a 12 cm EKOS infusion catheter. The sheaths and catheters were sewn in place. Lysis was instituted with tPA at 1 milligram/hour. FINDINGS: Bilateral pulmonary arteriography confirms proper position of the right and left pulmonary artery catheter is within the pulmonary arterial tree. Final imaging demonstrates appropriate position of the bilateral infusion catheters within the right and left pulmonary  arteries. IMPRESSION: Successful institution of EKOS pulmonary artery lysis for submassive pulmonary thromboembolism. Electronically Signed   By: Marybelle Killings M.D.   On: 02/08/2018 08:41   Dg Chest Port 1 View  Result Date: 02/07/2018 CLINICAL DATA:  Pulmonary edema EXAM: PORTABLE CHEST 1 VIEW COMPARISON:  02/06/2018 FINDINGS: Prior median sternotomy. Infusion catheters are noted in the pulmonary arteries bilaterally. Mild elevation of the right hemidiaphragm. Small right pleural effusion with right base atelectasis. Left lung clear. IMPRESSION: Small right pleural effusion with right base atelectasis. Electronically Signed   By: Rolm Baptise M.D.   On: 02/07/2018 07:09   Dg Chest Port 1 View  Result Date: 02/06/2018 CLINICAL DATA:  Shortness of breath EXAM: PORTABLE CHEST 1 VIEW COMPARISON:  CT chest 01/03/2018 FINDINGS: Chronic partially loculated right pleural effusion. No focal consolidation. No pneumothorax. No left pleural effusion. Stable cardiomediastinal silhouette. Surgical clips in the right hilum. Prior median sternotomy. The osseous structures are unremarkable. IMPRESSION: 1. No acute cardiopulmonary disease. 2. Stable small loculated right pleural effusion. Electronically Signed   By: Kathreen Devoid   On: 02/06/2018 11:21   Dg Abd Portable 1v  Result Date: 02/10/2018 CLINICAL DATA:  Abdominal pain and constipation EXAM: PORTABLE ABDOMEN - 1 VIEW COMPARISON:  CT abdomen and pelvis 01/03/2018 FINDINGS: Normal bowel gas pattern. No bowel dilatation or bowel wall thickening. Bones demineralized. LEFT hip prosthesis. No urinary tract calcification. IMPRESSION: Normal bowel gas pattern Electronically Signed   By: Lavonia Dana M.D.   On: 02/10/2018 08:56   Ir Infusion Thrombol Arterial Initial (ms)  Result Date: 02/08/2018 INDICATION: Sub massive pulmonary thromboembolism EXAM: PULMONARY ARTERY LYSIS COMPARISON:  None MEDICATIONS: None. ANESTHESIA/SEDATION: Versed 2 mg IV; Fentanyl 100 mcg IV  Moderate Sedation Time:  58 minutes The patient was continuously monitored during the procedure by the interventional radiology nurse under my direct supervision. FLUOROSCOPY TIME:  Fluoroscopy Time: 10 minutes 18 seconds (60 mGy). COMPLICATIONS: None immediate. TECHNIQUE: Informed written consent was obtained from the patient after a thorough discussion of the procedural risks, benefits and alternatives. All questions were addressed. Maximal Sterile Barrier Technique was utilized including caps, mask, sterile gowns, sterile gloves, sterile drape, hand hygiene and skin antiseptic. A timeout was performed prior to the initiation of the procedure. The right groin was prepped and draped in a sterile fashion. 1% lidocaine was utilized for local anesthesia.  Under sonographic guidance, a micropuncture needle was inserted into the right common femoral vein and removed over a 018 wire. Wire was up sized to a Bentson. A 7 French sheath was inserted. An additional 7 French sheath was placed in a similar fashion in the right common femoral vein. Sonographic documentation was obtained. The common femoral vein was noted to be patent by sonographic imaging. A vertebral catheter was advanced over the Bentson wire to the right atrium. It was then advanced over a glidewire into the main pulmonary artery. Pulmonary artery pressure was obtained at 47/22 with a mean of 32 mm Hg. The catheter was advanced over the glidewire to the distal right pulmonary artery. Pulmonary arteriography was performed. The vertebral catheter was exchanged over a rose in wire for a 12 cm EKOS pulmonary artery infusion catheter. The vertebral catheter was then advanced over a Bentson wire through the second sheath to the right atrium. It was then advanced over a glidewire in the distal left pulmonary artery. Contrast was injected for pulmonary arteriography. It was then exchanged over a rose in wire for a 12 cm EKOS infusion catheter. The sheaths and  catheters were sewn in place. Lysis was instituted with tPA at 1 milligram/hour. FINDINGS: Bilateral pulmonary arteriography confirms proper position of the right and left pulmonary artery catheter is within the pulmonary arterial tree. Final imaging demonstrates appropriate position of the bilateral infusion catheters within the right and left pulmonary arteries. IMPRESSION: Successful institution of EKOS pulmonary artery lysis for submassive pulmonary thromboembolism. Electronically Signed   By: Marybelle Killings M.D.   On: 02/08/2018 08:41   Ir Infusion Thrombol Arterial Initial (ms)  Result Date: 02/08/2018 INDICATION: Sub massive pulmonary thromboembolism EXAM: PULMONARY ARTERY LYSIS COMPARISON:  None MEDICATIONS: None. ANESTHESIA/SEDATION: Versed 2 mg IV; Fentanyl 100 mcg IV Moderate Sedation Time:  58 minutes The patient was continuously monitored during the procedure by the interventional radiology nurse under my direct supervision. FLUOROSCOPY TIME:  Fluoroscopy Time: 10 minutes 18 seconds (60 mGy). COMPLICATIONS: None immediate. TECHNIQUE: Informed written consent was obtained from the patient after a thorough discussion of the procedural risks, benefits and alternatives. All questions were addressed. Maximal Sterile Barrier Technique was utilized including caps, mask, sterile gowns, sterile gloves, sterile drape, hand hygiene and skin antiseptic. A timeout was performed prior to the initiation of the procedure. The right groin was prepped and draped in a sterile fashion. 1% lidocaine was utilized for local anesthesia. Under sonographic guidance, a micropuncture needle was inserted into the right common femoral vein and removed over a 018 wire. Wire was up sized to a Bentson. A 7 French sheath was inserted. An additional 7 French sheath was placed in a similar fashion in the right common femoral vein. Sonographic documentation was obtained. The common femoral vein was noted to be patent by sonographic  imaging. A vertebral catheter was advanced over the Bentson wire to the right atrium. It was then advanced over a glidewire into the main pulmonary artery. Pulmonary artery pressure was obtained at 47/22 with a mean of 32 mm Hg. The catheter was advanced over the glidewire to the distal right pulmonary artery. Pulmonary arteriography was performed. The vertebral catheter was exchanged over a rose in wire for a 12 cm EKOS pulmonary artery infusion catheter. The vertebral catheter was then advanced over a Bentson wire through the second sheath to the right atrium. It was then advanced over a glidewire in the distal left pulmonary artery. Contrast was injected for pulmonary arteriography. It  was then exchanged over a rose in wire for a 12 cm EKOS infusion catheter. The sheaths and catheters were sewn in place. Lysis was instituted with tPA at 1 milligram/hour. FINDINGS: Bilateral pulmonary arteriography confirms proper position of the right and left pulmonary artery catheter is within the pulmonary arterial tree. Final imaging demonstrates appropriate position of the bilateral infusion catheters within the right and left pulmonary arteries. IMPRESSION: Successful institution of EKOS pulmonary artery lysis for submassive pulmonary thromboembolism. Electronically Signed   By: Marybelle Killings M.D.   On: 02/08/2018 08:41   Ir Jacolyn Reedy F/u Elizabeth Sauer Art/ven Final Day (ms)  Result Date: 02/07/2018 INDICATION: 67 year old male with a history of symptomatic pulmonary embolism, status post catheter directed local thrombolysis EXAM: PE LYSIS FOLLOW-UP COMPARISON:  CT 02/06/2018, 02/06/2018 MEDICATIONS: None. ANESTHESIA/SEDATION: None FLUOROSCOPY TIME:  None COMPLICATIONS: None TECHNIQUE: Informed written consent was obtained from the patient after a thorough discussion of the procedural risks, benefits and alternatives. All questions were addressed. Maximal Sterile Barrier Technique was utilized including caps, mask, sterile gowns,  sterile gloves, sterile drape, hand hygiene and skin antiseptic. A timeout was performed prior to the initiation of the procedure. Transduction of the left and right pulmonary arterial lysis catheter performed at the bedside. Once the pressures were measured, all catheters were removed. Venous sheath removed from the right common femoral vein approach. Sterile bandages were placed. Patient tolerated the procedure well and remained hemodynamically stable throughout. No complications were encountered and no significant blood loss. FINDINGS: Left PA pressure: 28 Right PA pressure: 25 Measurements performed 11 a.m. 02/07/2018 IMPRESSION: Status post bedside transduction of PA lytic catheters for repeat pressure measurement, as above. Signed, Dulcy Fanny. Dellia Nims, RPVI [facility] Electronically Signed   By: Corrie Mckusick D.O.   On: 02/07/2018 15:56      Subjective: Feels much better.  Right calp pain and swelling improved after tramadol.  Able to weight-bear.  Left flank pain also improved.  Hematuria clearing up this morning.  No chest pain, dyspnea, dizziness or lightheadedness noted.  Discharge Exam:  Vitals:   02/12/18 2123 02/13/18 0004 02/13/18 0500 02/13/18 0830  BP: 137/84 122/72  128/77  Pulse: (!) 101 76  93  Resp:  15    Temp:  98.6 F (37 C)  97.8 F (36.6 C)  TempSrc:    Oral  SpO2:  99%  96%  Weight:   103.4 kg   Height:        General exam: Pleasant middle-aged male, moderately built and obese, sitting up comfortably in bed. Respiratory system: Clear to auscultation. Respiratory effort normal.   Cardiovascular system: S1 & S2 heard, RRR. No JVD, murmurs, rubs, gallops or clicks. Trace right ankle edema.  Gastrointestinal system: Abdomen is nondistended, soft and nontender. No organomegaly or masses felt. Normal bowel sounds heard.  Old bruise noted over right inguinal region and lower suprapubic area > slowly resolving.   No L flank tenderness or external bruising at the flank.   Central nervous system: Alert and oriented. No focal neurological deficits.   Extremities: Symmetric 5 x 5 power.  Old bruising over right upper extremity > slowly resolving.  Approximately golf ball size area of tenderness, mild swelling right lateral mid calf without erythema, increased warmth or fluctuation, improved compared to yesterday.  No external bruising RLE. Skin: No rashes, lesions or ulcers Psychiatry: Judgement and insight appear normal. Mood & affect appropriate.     The results of significant diagnostics  from this hospitalization (including imaging, microbiology, ancillary and laboratory) are listed below for reference.     Microbiology: Recent Results (from the past 240 hour(s))  MRSA PCR Screening     Status: None   Collection Time: 02/06/18  5:28 PM  Result Value Ref Range Status   MRSA by PCR NEGATIVE NEGATIVE Final    Comment:        The GeneXpert MRSA Assay (FDA approved for NASAL specimens only), is one component of a comprehensive MRSA colonization surveillance program. It is not intended to diagnose MRSA infection nor to guide or monitor treatment for MRSA infections. Performed at [facility], [place] [address] [place], [place] [address]      Labs: CBC: Recent Labs  Lab 02/07/18 0550  02/09/18 0257 02/11/18 0434 02/11/18 2004 02/12/18 0313 02/13/18 0335  WBC 9.1   < > 7.1 6.8 9.0 8.4 9.2  NEUTROABS 6.6  --   --   --   --   --   --   HGB 14.5   < > 13.8 12.9* 13.7 12.4* 11.3*  HCT 45.2   < > 42.3 39.7 42.6 38.9* 34.9*  MCV 92.1   < > 91.8 90.4 91.6 91.7 90.9  PLT 98*   < > 106* 140* 168 164 160   < > = values in this interval not displayed.   Basic Metabolic Panel: Recent Labs  Lab 02/07/18 0550 02/08/18 0408 02/09/18 0257 02/10/18 0417 02/12/18 0313 02/13/18 0335  NA 139 138 139 136 140 136  K 3.9 3.8 4.3 3.6 4.3 4.1  CL 110 110 107 104 105 101  CO2 23 25 26 23 27 24    GLUCOSE 115* 99 92 106* 110* 124*  BUN 14 15 14 12 11 14   CREATININE 1.02 1.31* 1.23 1.04 1.32* 1.45*  CALCIUM 8.4* 8.3* 8.5* 8.7* 9.1 8.8*  MG 2.1  --   --   --   --   --    Liver Function Tests: Recent Labs  Lab 02/07/18 0550 02/08/18 0408  AST 29 27  ALT 27 26  ALKPHOS 40 50  BILITOT 1.3* 0.8  PROT 5.5* 5.8*  ALBUMIN 3.1* 3.4*   BNP (last 3 results) Recent Labs    02/06/18 1038  BNP 120.7*   Cardiac Enzymes: Recent Labs  Lab 02/11/18 0930  CKTOTAL 516*   Urinalysis    Component Value Date/Time   COLORURINE RED (A) 02/11/2018 0938   APPEARANCEUR TURBID (A) 02/11/2018 0938   LABSPEC  02/11/2018 0938    TEST NOT REPORTED DUE TO COLOR INTERFERENCE OF URINE PIGMENT   PHURINE  02/11/2018 0938    TEST NOT REPORTED DUE TO COLOR INTERFERENCE OF URINE PIGMENT   GLUCOSEU (A) 02/11/2018 0938    TEST NOT REPORTED DUE TO COLOR INTERFERENCE OF URINE PIGMENT   GLUCOSEU NEGATIVE 06/29/2016 1153   HGBUR (A) 02/11/2018 0938    TEST NOT REPORTED DUE TO COLOR INTERFERENCE OF URINE PIGMENT   BILIRUBINUR (A) 02/11/2018 0938    TEST NOT REPORTED DUE TO COLOR INTERFERENCE OF URINE PIGMENT   KETONESUR NEGATIVE 02/11/2018 0938   PROTEINUR (A) 02/11/2018 0938    TEST NOT REPORTED DUE TO COLOR INTERFERENCE OF URINE PIGMENT   UROBILINOGEN 0.2 06/29/2016 1153   NITRITE (A) 02/11/2018 0938    TEST NOT REPORTED DUE TO COLOR INTERFERENCE OF URINE PIGMENT   LEUKOCYTESUR (A) 02/11/2018 0938    TEST NOT REPORTED DUE TO COLOR INTERFERENCE OF URINE PIGMENT  I discussed in detail with patient's spouse and father-in-law at bedside.  Updated care and answered all questions.   Time coordinating discharge: 60 minutes  SIGNED:  Vernell Leep, MD, FACP, [facility]. [facility] Pager [phone]  If 7PM-7AM, please contact night-coverage [email or web address] Password [email or web address] 02/13/2018, 1:50 PM

## 2018-02-14 ENCOUNTER — Telehealth: Payer: Self-pay | Admitting: *Deleted

## 2018-02-14 ENCOUNTER — Ambulatory Visit: Payer: Self-pay | Admitting: *Deleted

## 2018-02-14 NOTE — Telephone Encounter (Signed)
Transition Care Management Follow-up Telephone Call   Date discharged? 02/13/18   How have you been since you were released from the hospital? Called pt spoke w/wife she states husband is doing fine   Do you understand why you were in the hospital? YES   Do you understand the discharge instructions? YES   Where were you discharged to? Home   Items Reviewed:  Medications reviewed: YES  Allergies reviewed: YES  Dietary changes reviewed: YES, heart healthy  Referrals reviewed: YES, still waiting on appt w/urologist   Functional Questionnaire:   Activities of Daily Living (ADLs):   She states he are independent in the following: ambulation, bathing and hygiene, feeding, continence, grooming, toileting and dressing States he doesn't require assistance    Any transportation issues/concerns?: NO   Any patient concerns? NO   Confirmed importance and date/time of follow-up visits scheduled YES, appt 02/17/18  Provider Appointment booked with Dr. Ronnald Ramp  Confirmed with patient if condition begins to worsen call PCP or go to the ER.  Patient was given the office number and encouraged to call back with question or concerns.  : YES

## 2018-02-14 NOTE — Telephone Encounter (Signed)
Pt's wife called because he has temperature is 100.1 02/14/18 at 1500; she also reports that he has chills; recommendations made per nurse triage protocol; the pt's wife has scheduled him for a follow up visit on 02/17/18 per [employer] agent [employer]; offered to connect pt's wife with oncology but she refused and states that her husband needs to be seen by Dr Ronnald Ramp and have his labs drawn on Tuesday 02/15/18 as directed per discharge instructions;  conference call initiated with Sam at [facility]; she would like to have her husband seen on Tuesday 02/15/18 for at least his labs; Sam explains that she will get a message to Dr Ronnald Ramp, but he is unable to come to the phone; Sam then offers an appointment with Dr [name], [facility], 02/15/18 at 0830; she verbalizes understanding; will route office for notification.    Reason for Disposition . [1] Fever > 100.0 F (37.8 C) AND [2] diabetes mellitus or weak immune system (e.g., HIV positive, cancer chemo, splenectomy, organ transplant, chronic steroids)  Answer Assessment - Initial Assessment Questions 1. TEMPERATURE: "What is the most recent temperature?"  "How was it measured?"      100.1 with oral digital thermometer 2. ONSET: "When did the fever start?"      02/14/18 at 1500 3. SYMPTOMS: "Do you have any other symptoms besides the fever?"  (e.g., colds, headache, sore throat, earache, cough, rash, diarrhea, vomiting, abdominal pain)     no 4. CAUSE: If there are no symptoms, ask: "What do you think is causing the fever?"      Not sure; released from hospital 02/13/18 5. CONTACTS: "Does anyone else in the family have an infection?"     no 6. TREATMENT: "What have you done so far to treat this fever?" (e.g., medications)     Tylenol 650 mg at 1500 7. IMMUNOCOMPROMISE: "Do you have of the following: diabetes, HIV positive, splenectomy, cancer chemotherapy, chronic steroid treatment, transplant patient, etc."     chemo 8. PREGNANCY: "Is there any chance you are  pregnant?" "When was your last menstrual period?"     n/a 9. TRAVEL: "Have you traveled out of the country in the last month?" (e.g., travel history, exposures)     no  Answer Assessment - Initial Assessment Questions 1. TEMPERATURE: "What is the most recent temperature?"  "How was it measured?"      100.4 digital oral thermometer 2. ONSET: "When did the fever start?"      02/14/18 3. SYMPTOMS: "Do you have any other symptoms besides the fever?"  (e.g., sore throat, earache, runny nose, cough, rash, diarrhea, vomiting, abdominal pain, painful urination)     no 4. CONTACTS: "Does anyone else in the family have an infection?"     no 5. FEVER TREATMENT: "What have you done so far to treat this fever?" (e.g., medications)     Tylenol 650 mg at 1500 02/14/18 6. CANCER: "What type of cancer do you have?"     lung 7. CANCER - TREATMENT: "What cancer treatments have you received?" "When did you last receive them?" (e.g., recent surgery, radiation, or chemotherapy). If triager has access to the patient's medical record, triager should review treatments and administration dates.     tagreso (targeted therapy) 8. CANCER - NEUTROPENIA RISK: "Have you received chemotherapy recently? If Yes, "When was it and what was your last CBC and ANC (absolute neutrophil count)?" "Were you told that your white cell count was low?" If triager has access to the patient's  medical record, triager should review most recent labs. An ANC less than 1,000 - 1,500 means that the neutrophils are low and the immune system is weak.   Yes 02/14/18  Protocols used: CANCER - FEVER-A-AH, FEVER-A-AH

## 2018-02-15 ENCOUNTER — Ambulatory Visit: Payer: Medicare Other | Admitting: Internal Medicine

## 2018-02-15 ENCOUNTER — Other Ambulatory Visit (INDEPENDENT_AMBULATORY_CARE_PROVIDER_SITE_OTHER): Payer: Medicare Other

## 2018-02-15 ENCOUNTER — Encounter: Payer: Self-pay | Admitting: Internal Medicine

## 2018-02-15 VITALS — BP 124/64 | HR 91 | Temp 99.8°F | Ht 69.0 in | Wt 213.0 lb

## 2018-02-15 DIAGNOSIS — R310 Gross hematuria: Secondary | ICD-10-CM | POA: Diagnosis not present

## 2018-02-15 DIAGNOSIS — E118 Type 2 diabetes mellitus with unspecified complications: Secondary | ICD-10-CM

## 2018-02-15 DIAGNOSIS — D539 Nutritional anemia, unspecified: Secondary | ICD-10-CM

## 2018-02-15 DIAGNOSIS — N410 Acute prostatitis: Secondary | ICD-10-CM

## 2018-02-15 DIAGNOSIS — R509 Fever, unspecified: Secondary | ICD-10-CM

## 2018-02-15 DIAGNOSIS — D508 Other iron deficiency anemias: Secondary | ICD-10-CM

## 2018-02-15 LAB — URINALYSIS, ROUTINE W REFLEX MICROSCOPIC
Ketones, ur: NEGATIVE
Leukocytes, UA: NEGATIVE
Nitrite: NEGATIVE
Specific Gravity, Urine: 1.02 (ref 1.000–1.030)
Total Protein, Urine: 30 — AB
Urine Glucose: NEGATIVE
Urobilinogen, UA: 1 (ref 0.0–1.0)
pH: 6 (ref 5.0–8.0)

## 2018-02-15 LAB — CBC WITH DIFFERENTIAL/PLATELET
Basophils Absolute: 0.1 10*3/uL (ref 0.0–0.1)
Basophils Relative: 1.1 % (ref 0.0–3.0)
Eosinophils Absolute: 0.2 10*3/uL (ref 0.0–0.7)
Eosinophils Relative: 2.5 % (ref 0.0–5.0)
HCT: 33.1 % — ABNORMAL LOW (ref 39.0–52.0)
Hemoglobin: 11.1 g/dL — ABNORMAL LOW (ref 13.0–17.0)
Lymphocytes Relative: 7.4 % — ABNORMAL LOW (ref 12.0–46.0)
Lymphs Abs: 0.7 10*3/uL (ref 0.7–4.0)
MCHC: 33.5 g/dL (ref 30.0–36.0)
MCV: 89.3 fl (ref 78.0–100.0)
Monocytes Absolute: 0.9 10*3/uL (ref 0.1–1.0)
Monocytes Relative: 10.2 % (ref 3.0–12.0)
Neutro Abs: 7.3 10*3/uL (ref 1.4–7.7)
Neutrophils Relative %: 78.8 % — ABNORMAL HIGH (ref 43.0–77.0)
Platelets: 240 10*3/uL (ref 150.0–400.0)
RBC: 3.7 Mil/uL — ABNORMAL LOW (ref 4.22–5.81)
RDW: 15 % (ref 11.5–15.5)
WBC: 9.3 10*3/uL (ref 4.0–10.5)

## 2018-02-15 LAB — COMPREHENSIVE METABOLIC PANEL
ALT: 33 U/L (ref 0–53)
AST: 37 U/L (ref 0–37)
Albumin: 3.7 g/dL (ref 3.5–5.2)
Alkaline Phosphatase: 48 U/L (ref 39–117)
BUN: 19 mg/dL (ref 6–23)
CO2: 28 mEq/L (ref 19–32)
Calcium: 9 mg/dL (ref 8.4–10.5)
Chloride: 103 mEq/L (ref 96–112)
Creatinine, Ser: 1.46 mg/dL (ref 0.40–1.50)
GFR: 50.92 mL/min — ABNORMAL LOW (ref >60.00)
Glucose, Bld: 133 mg/dL — ABNORMAL HIGH (ref 70–99)
Potassium: 4.4 mEq/L (ref 3.5–5.1)
Sodium: 139 mEq/L (ref 135–145)
Total Bilirubin: 0.8 mg/dL (ref 0.2–1.2)
Total Protein: 7 g/dL (ref 6.0–8.3)

## 2018-02-15 LAB — FERRITIN: Ferritin: 370.4 ng/mL — ABNORMAL HIGH (ref 22.0–322.0)

## 2018-02-15 LAB — VITAMIN B12: Vitamin B-12: 833 pg/mL (ref 211–911)

## 2018-02-15 LAB — HEMOGLOBIN A1C: Hgb A1c MFr Bld: 5.6 % (ref 4.6–6.5)

## 2018-02-15 LAB — IBC PANEL
Iron: 16 ug/dL — ABNORMAL LOW (ref 42–165)
Saturation Ratios: 5.7 % — ABNORMAL LOW (ref 20.0–50.0)
Transferrin: 202 mg/dL — ABNORMAL LOW (ref 212.0–360.0)

## 2018-02-15 LAB — C-REACTIVE PROTEIN: CRP: 18.1 mg/dL (ref 0.5–20.0)

## 2018-02-15 LAB — SEDIMENTATION RATE: Sed Rate: 57 mm/hr — ABNORMAL HIGH (ref 0–20)

## 2018-02-15 LAB — FOLATE: Folate: >23.9 ng/mL (ref >5.9)

## 2018-02-15 MED ORDER — SULFAMETHOXAZOLE-TRIMETHOPRIM 800-160 MG PO TABS: 1.0000 | ORAL_TABLET | Freq: Two times a day (BID) | ORAL | 0 refills | Status: DC

## 2018-02-15 NOTE — Progress Notes (Signed)
Subjective:  Patient ID: James [name], male    DOB: 1950/07/09  Age: 67 y.o. MRN: [id number]  CC: Anemia   HPI ROCKEY GUARINO presents for f/up - He was admitted about 10 days ago for shortness of breath and was found to have a submassive pulmonary embolus.  He has been anticoagulated for about 10 days now.  The admission was complicated with gross hematuria.  He tells me he saw a urologist.  The gross hematuria has resolved.  He was not catheterized.  He complains of low-grade fever since discharge 4 days ago.  Temperature range has been 99.3-100.1.  He also complains of urinary frequency, nocturia, and some hesitancy.  He has multiple bruises on his extremities and groin but denies any redness, swelling, or pain at the sites.  Outpatient Medications Prior to Visit  Medication Sig Dispense Refill  . acetaminophen (TYLENOL) 325 MG tablet Take 650 mg by mouth every 6 (six) hours as needed for moderate pain or headache.    Marland Kitchen atorvastatin (LIPITOR) 20 MG tablet TAKE 1 TABLET (20 MG TOTAL) BY MOUTH DAILY. 90 tablet 0  . furosemide (LASIX) 40 MG tablet Take 1 tablet (40 mg total) by mouth as needed for fluid.    . metoprolol tartrate (LOPRESSOR) 25 MG tablet TAKE 1/2 TABLET BY MOUTH 2 TIMES DAILY (Patient taking differently: Take 12.5 mg by mouth 2 (two) times daily. ) 30 tablet 11  . Omega-3 Fatty Acids (FISH OIL) 500 MG CAPS Take 500 mg by mouth daily.     . potassium chloride SA (K-DUR,KLOR-CON) 20 MEQ tablet Take 1 tablet (20 mEq total) by mouth daily as needed. With lasix 90 tablet 0  . Rivaroxaban 15 & 20 MG TBPK Take as directed on package: Start with one 15mg  tablet by mouth twice a day with food. On Day 22, switch to one 20mg  tablet once a day with food. PHARMACY: Please discard the first 2 tablets from the starter pack which he received in the hospital. 51 each 0  . sodium chloride (OCEAN) 0.65 % SOLN nasal spray Place 1 spray into both nostrils as needed for congestion.    Marland Kitchen TAGRISSO 80  MG tablet TAKE 1 TABLET (80 MG TOTAL) BY MOUTH DAILY. (Patient taking differently: Take 80 mg by mouth daily. ) 30 tablet 2  . traMADol (ULTRAM) 50 MG tablet Take 1 tablet (50 mg total) by mouth every 6 (six) hours as needed (may take one or two tablets every six hrs prn). 30 tablet 0  . bisacodyl (DULCOLAX) 10 MG suppository Place 1 suppository (10 mg total) rectally daily as needed for moderate constipation. 12 suppository 0  . docusate sodium (COLACE) 100 MG capsule Take 1 capsule (100 mg total) by mouth 2 (two) times daily. 10 capsule 0  . polyethylene glycol (MIRALAX / GLYCOLAX) packet Take 17 g by mouth 2 (two) times daily. 14 each 0   No facility-administered medications prior to visit.     ROS Review of Systems  Constitutional: Positive for fatigue and fever. Negative for chills and diaphoresis.  HENT: Negative.  Negative for facial swelling, sinus pressure and sore throat.   Respiratory: Negative for cough, chest tightness, shortness of breath and wheezing.   Cardiovascular: Negative for chest pain, palpitations and leg swelling.  Gastrointestinal: Negative for abdominal pain, constipation, diarrhea, nausea and vomiting.  Endocrine: Negative.   Genitourinary: Positive for difficulty urinating and frequency. Negative for decreased urine volume, discharge, dysuria, flank pain, hematuria, penile swelling, scrotal  swelling, testicular pain and urgency.  Musculoskeletal: Negative.  Negative for arthralgias and myalgias.  Skin: Negative.  Negative for color change and pallor.  Neurological: Negative.  Negative for dizziness, weakness, light-headedness and headaches.  Hematological: Negative for adenopathy. Bruises/bleeds easily.  Psychiatric/Behavioral: Negative.     Objective:  BP 124/64 (BP Location: Left Arm, Patient Position: Sitting, Cuff Size: Normal)   Pulse 91   Temp 99.8 F (37.7 C) (Oral)   Ht 5\' 9"  (1.753 m)   Wt 213 lb (96.6 kg)   SpO2 95%   BMI 31.45 kg/m   BP  Readings from Last 3 Encounters:  02/15/18 124/64  02/13/18 128/77  01/05/18 128/72    Wt Readings from Last 3 Encounters:  02/15/18 213 lb (96.6 kg)  02/13/18 227 lb 15.3 oz (103.4 kg)  01/05/18 221 lb 8 oz (100.5 kg)    Physical Exam  Constitutional: No distress.  HENT:  Mouth/Throat: Oropharynx is clear and moist. No oropharyngeal exudate.  Eyes: Conjunctivae are normal. No scleral icterus.  Neck: Normal range of motion. Neck supple. No JVD present. No thyromegaly present.  Cardiovascular: Normal rate, regular rhythm and normal heart sounds. Exam reveals no gallop and no friction rub.  No murmur heard. Pulmonary/Chest: Effort normal and breath sounds normal. No respiratory distress. He has no wheezes. He has no rales.  Abdominal: Soft. Normal appearance and bowel sounds are normal. He exhibits no mass. There is no hepatosplenomegaly or hepatomegaly. There is no tenderness. There is no rigidity. Hernia confirmed negative in the right inguinal area and confirmed negative in the left inguinal area.  Genitourinary: Testes normal and penis normal. Rectal exam shows external hemorrhoid and internal hemorrhoid. Rectal exam shows no fissure, no mass, no tenderness, anal tone normal and guaiac negative stool. Prostate is enlarged. Prostate is not tender. Right testis shows no mass, no swelling and no tenderness. Left testis shows no mass, no swelling and no tenderness. Circumcised. No penile erythema or penile tenderness. No discharge found.  Genitourinary Comments: 2+ smooth symmetrical BPH with bogginess  Musculoskeletal: Normal range of motion. He exhibits edema. He exhibits no tenderness or deformity.  Trace pitting edema over both ankles  Lymphadenopathy:    He has no cervical adenopathy. No inguinal adenopathy noted on the right or left side.  Skin: He is not diaphoretic.  Vitals reviewed.   Lab Results  Component Value Date   WBC 9.3 02/15/2018   HGB 11.1 (L) 02/15/2018   HCT  33.1 (L) 02/15/2018   PLT 240.0 02/15/2018   GLUCOSE 133 (H) 02/15/2018   CHOL 177 05/27/2017   TRIG 128 05/27/2017   HDL 41 05/27/2017   LDLDIRECT 177.0 07/24/2015   LDLCALC 110 (H) 05/27/2017   ALT 33 02/15/2018   AST 37 02/15/2018   NA 139 02/15/2018   K 4.4 02/15/2018   CL 103 02/15/2018   CREATININE 1.46 02/15/2018   BUN 19 02/15/2018   CO2 28 02/15/2018   TSH 2.56 07/28/2017   PSA 0.91 07/28/2017   INR 1.28 02/07/2018   HGBA1C 5.6 02/15/2018   MICROALBUR <0.7 06/29/2016    Ct Head Wo Contrast  Result Date: 02/06/2018 CLINICAL DATA:  Lung cancer.  Pre tPA. EXAM: CT HEAD WITHOUT CONTRAST TECHNIQUE: Contiguous axial images were obtained from the base of the skull through the vertex without intravenous contrast. COMPARISON:  10/26/2017 FINDINGS: Brain: No mass effect, midline shift, or acute hemorrhage. Vascular: No hyperdense vessel or unexpected calcification. Skull: Intact. Sinuses/Orbits: No acute finding. Other: Noncontributory  IMPRESSION: No acute intracranial pathology. Electronically Signed   By: Marybelle Killings M.D.   On: 02/06/2018 17:03   Ct Angio Chest Pe W And/or Wo Contrast  Result Date: 02/06/2018 CLINICAL DATA:  Shortness of breath.  History of lung carcinoma EXAM: CT ANGIOGRAPHY CHEST WITH CONTRAST TECHNIQUE: Multidetector CT imaging of the chest was performed using the standard protocol during bolus administration of intravenous contrast. Multiplanar CT image reconstructions and MIPs were obtained to evaluate the vascular anatomy. CONTRAST:  100 mL ISOVUE-370 IOPAMIDOL (ISOVUE-370) INJECTION 76% COMPARISON:  Chest CT January 03, 2018; chest radiograph February 06, 2018. FINDINGS: Cardiovascular: There is extensive pulmonary embolism arising from the main pulmonary arteries bilaterally and extending into multiple upper and lower lobe pulmonary artery branches. The right ventricle to left ventricle diameter ratio is 1.7, consistent with right heart strain. There is no  thoracic aortic aneurysm or dissection. The visualized great vessels appear unremarkable. No pericardial effusion or pericardial thickening. The main pulmonary outflow tract measures 3.1 cm which is prominent. Mediastinum/Nodes: Thyroid appears unremarkable. There is no appreciable thoracic adenopathy. No esophageal lesions are evident. Lungs/Pleura: There is a moderate pleural effusion on the right with atelectatic change in the right base. There are areas of scarring on the right. There is a small granuloma in the right upper lobe. There is a stable 4 mm nodular opacity in the posterior segment right upper lobe seen on axial slice 50 series 7. There is a stable 4 mm nodular opacity in the superior segment of the right lower lobe seen on axial slice 51 series 7. There is left base atelectasis. There is no edema or consolidation on the left. There is a stable 5 mm nodular opacity in the posterior segment left upper lobe seen on axial slice 26 series 7. No pleural effusion on the left evident. Upper Abdomen: There is reflux of contrast into the inferior vena cava and hepatic veins. There is a cyst arising in the posterior segment right lobe of the liver measuring 4.4 x 3.9 cm. Visualized upper abdominal structures otherwise appear unremarkable. Musculoskeletal: Patient is status post median sternotomy. There is degenerative change in the thoracic spine with diffuse idiopathic skeletal hyperostosis. There are no appreciable blastic or lytic bone lesions. No chest wall lesions are evident. Review of the MIP images confirms the above findings. IMPRESSION: 1. Positive for acute PE with CT evidence of right heart strain (RV/LV Ratio = 1.7) consistent with at least submassive (intermediate risk) PE. The presence of right heart strain has been associated with an increased risk of morbidity and mortality. Please activate Code PE by paging [phone]. Pulmonary emboli arise in each main pulmonary artery with extension into  multiple upper and lower lobe branches. 2.  No thoracic aortic aneurysm or dissection. 3. Prominence of the main pulmonary outflow tract, a finding felt to be indicative of a degree of pulmonary arterial hypertension. 4. Moderate pleural effusion on the right with right base atelectasis. Areas of scarring and volume loss the right. Left base atelectasis. No edema or consolidation on the left. Subcentimeter nodular opacities remain as noted above, stable. 5.  No demonstrable thoracic adenopathy. 6. Reflux of contrast into the inferior vena cava and hepatic veins, a finding felt to be indicative of increase in right heart pressure. 7.  Diffuse idiopathic skeletal hyperostosis in the thoracic spine. Critical Value/emergent results were called by telephone at the time of interpretation on 02/06/2018 at 1:25 pm to Dr. Julianne Rice , who verbally acknowledged these results. Electronically  Signed   By: Lowella Grip III M.D.   On: 02/06/2018 13:26   Ir Angiogram Pulmonary Bilateral Selective  Result Date: 02/08/2018 INDICATION: Sub massive pulmonary thromboembolism EXAM: PULMONARY ARTERY LYSIS COMPARISON:  None MEDICATIONS: None. ANESTHESIA/SEDATION: Versed 2 mg IV; Fentanyl 100 mcg IV Moderate Sedation Time:  58 minutes The patient was continuously monitored during the procedure by the interventional radiology nurse under my direct supervision. FLUOROSCOPY TIME:  Fluoroscopy Time: 10 minutes 18 seconds (60 mGy). COMPLICATIONS: None immediate. TECHNIQUE: Informed written consent was obtained from the patient after a thorough discussion of the procedural risks, benefits and alternatives. All questions were addressed. Maximal Sterile Barrier Technique was utilized including caps, mask, sterile gowns, sterile gloves, sterile drape, hand hygiene and skin antiseptic. A timeout was performed prior to the initiation of the procedure. The right groin was prepped and draped in a sterile fashion. 1% lidocaine was utilized  for local anesthesia. Under sonographic guidance, a micropuncture needle was inserted into the right common femoral vein and removed over a 018 wire. Wire was up sized to a Bentson. A 7 French sheath was inserted. An additional 7 French sheath was placed in a similar fashion in the right common femoral vein. Sonographic documentation was obtained. The common femoral vein was noted to be patent by sonographic imaging. A vertebral catheter was advanced over the Bentson wire to the right atrium. It was then advanced over a glidewire into the main pulmonary artery. Pulmonary artery pressure was obtained at 47/22 with a mean of 32 mm Hg. The catheter was advanced over the glidewire to the distal right pulmonary artery. Pulmonary arteriography was performed. The vertebral catheter was exchanged over a rose in wire for a 12 cm EKOS pulmonary artery infusion catheter. The vertebral catheter was then advanced over a Bentson wire through the second sheath to the right atrium. It was then advanced over a glidewire in the distal left pulmonary artery. Contrast was injected for pulmonary arteriography. It was then exchanged over a rose in wire for a 12 cm EKOS infusion catheter. The sheaths and catheters were sewn in place. Lysis was instituted with tPA at 1 milligram/hour. FINDINGS: Bilateral pulmonary arteriography confirms proper position of the right and left pulmonary artery catheter is within the pulmonary arterial tree. Final imaging demonstrates appropriate position of the bilateral infusion catheters within the right and left pulmonary arteries. IMPRESSION: Successful institution of EKOS pulmonary artery lysis for submassive pulmonary thromboembolism. Electronically Signed   By: Marybelle Killings M.D.   On: 02/08/2018 08:41   Ir Angiogram Selective Each Additional Vessel  Result Date: 02/08/2018 INDICATION: Sub massive pulmonary thromboembolism EXAM: PULMONARY ARTERY LYSIS COMPARISON:  None MEDICATIONS: None.  ANESTHESIA/SEDATION: Versed 2 mg IV; Fentanyl 100 mcg IV Moderate Sedation Time:  58 minutes The patient was continuously monitored during the procedure by the interventional radiology nurse under my direct supervision. FLUOROSCOPY TIME:  Fluoroscopy Time: 10 minutes 18 seconds (60 mGy). COMPLICATIONS: None immediate. TECHNIQUE: Informed written consent was obtained from the patient after a thorough discussion of the procedural risks, benefits and alternatives. All questions were addressed. Maximal Sterile Barrier Technique was utilized including caps, mask, sterile gowns, sterile gloves, sterile drape, hand hygiene and skin antiseptic. A timeout was performed prior to the initiation of the procedure. The right groin was prepped and draped in a sterile fashion. 1% lidocaine was utilized for local anesthesia. Under sonographic guidance, a micropuncture needle was inserted into the right common femoral vein and removed over a 018 wire. Wire  was up sized to a Bentson. A 7 French sheath was inserted. An additional 7 French sheath was placed in a similar fashion in the right common femoral vein. Sonographic documentation was obtained. The common femoral vein was noted to be patent by sonographic imaging. A vertebral catheter was advanced over the Bentson wire to the right atrium. It was then advanced over a glidewire into the main pulmonary artery. Pulmonary artery pressure was obtained at 47/22 with a mean of 32 mm Hg. The catheter was advanced over the glidewire to the distal right pulmonary artery. Pulmonary arteriography was performed. The vertebral catheter was exchanged over a rose in wire for a 12 cm EKOS pulmonary artery infusion catheter. The vertebral catheter was then advanced over a Bentson wire through the second sheath to the right atrium. It was then advanced over a glidewire in the distal left pulmonary artery. Contrast was injected for pulmonary arteriography. It was then exchanged over a rose in wire  for a 12 cm EKOS infusion catheter. The sheaths and catheters were sewn in place. Lysis was instituted with tPA at 1 milligram/hour. FINDINGS: Bilateral pulmonary arteriography confirms proper position of the right and left pulmonary artery catheter is within the pulmonary arterial tree. Final imaging demonstrates appropriate position of the bilateral infusion catheters within the right and left pulmonary arteries. IMPRESSION: Successful institution of EKOS pulmonary artery lysis for submassive pulmonary thromboembolism. Electronically Signed   By: Marybelle Killings M.D.   On: 02/08/2018 08:41   Ir Angiogram Selective Each Additional Vessel  Result Date: 02/08/2018 INDICATION: Sub massive pulmonary thromboembolism EXAM: PULMONARY ARTERY LYSIS COMPARISON:  None MEDICATIONS: None. ANESTHESIA/SEDATION: Versed 2 mg IV; Fentanyl 100 mcg IV Moderate Sedation Time:  58 minutes The patient was continuously monitored during the procedure by the interventional radiology nurse under my direct supervision. FLUOROSCOPY TIME:  Fluoroscopy Time: 10 minutes 18 seconds (60 mGy). COMPLICATIONS: None immediate. TECHNIQUE: Informed written consent was obtained from the patient after a thorough discussion of the procedural risks, benefits and alternatives. All questions were addressed. Maximal Sterile Barrier Technique was utilized including caps, mask, sterile gowns, sterile gloves, sterile drape, hand hygiene and skin antiseptic. A timeout was performed prior to the initiation of the procedure. The right groin was prepped and draped in a sterile fashion. 1% lidocaine was utilized for local anesthesia. Under sonographic guidance, a micropuncture needle was inserted into the right common femoral vein and removed over a 018 wire. Wire was up sized to a Bentson. A 7 French sheath was inserted. An additional 7 French sheath was placed in a similar fashion in the right common femoral vein. Sonographic documentation was obtained. The common  femoral vein was noted to be patent by sonographic imaging. A vertebral catheter was advanced over the Bentson wire to the right atrium. It was then advanced over a glidewire into the main pulmonary artery. Pulmonary artery pressure was obtained at 47/22 with a mean of 32 mm Hg. The catheter was advanced over the glidewire to the distal right pulmonary artery. Pulmonary arteriography was performed. The vertebral catheter was exchanged over a rose in wire for a 12 cm EKOS pulmonary artery infusion catheter. The vertebral catheter was then advanced over a Bentson wire through the second sheath to the right atrium. It was then advanced over a glidewire in the distal left pulmonary artery. Contrast was injected for pulmonary arteriography. It was then exchanged over a rose in wire for a 12 cm EKOS infusion catheter. The sheaths and catheters were sewn  in place. Lysis was instituted with tPA at 1 milligram/hour. FINDINGS: Bilateral pulmonary arteriography confirms proper position of the right and left pulmonary artery catheter is within the pulmonary arterial tree. Final imaging demonstrates appropriate position of the bilateral infusion catheters within the right and left pulmonary arteries. IMPRESSION: Successful institution of EKOS pulmonary artery lysis for submassive pulmonary thromboembolism. Electronically Signed   By: Marybelle Killings M.D.   On: 02/08/2018 08:41   Ir US Guide Vasc Access Right  Result Date: 02/08/2018 INDICATION: Sub massive pulmonary thromboembolism EXAM: PULMONARY ARTERY LYSIS COMPARISON:  None MEDICATIONS: None. ANESTHESIA/SEDATION: Versed 2 mg IV; Fentanyl 100 mcg IV Moderate Sedation Time:  58 minutes The patient was continuously monitored during the procedure by the interventional radiology nurse under my direct supervision. FLUOROSCOPY TIME:  Fluoroscopy Time: 10 minutes 18 seconds (60 mGy). COMPLICATIONS: None immediate. TECHNIQUE: Informed written consent was obtained from the patient  after a thorough discussion of the procedural risks, benefits and alternatives. All questions were addressed. Maximal Sterile Barrier Technique was utilized including caps, mask, sterile gowns, sterile gloves, sterile drape, hand hygiene and skin antiseptic. A timeout was performed prior to the initiation of the procedure. The right groin was prepped and draped in a sterile fashion. 1% lidocaine was utilized for local anesthesia. Under sonographic guidance, a micropuncture needle was inserted into the right common femoral vein and removed over a 018 wire. Wire was up sized to a Bentson. A 7 French sheath was inserted. An additional 7 French sheath was placed in a similar fashion in the right common femoral vein. Sonographic documentation was obtained. The common femoral vein was noted to be patent by sonographic imaging. A vertebral catheter was advanced over the Bentson wire to the right atrium. It was then advanced over a glidewire into the main pulmonary artery. Pulmonary artery pressure was obtained at 47/22 with a mean of 32 mm Hg. The catheter was advanced over the glidewire to the distal right pulmonary artery. Pulmonary arteriography was performed. The vertebral catheter was exchanged over a rose in wire for a 12 cm EKOS pulmonary artery infusion catheter. The vertebral catheter was then advanced over a Bentson wire through the second sheath to the right atrium. It was then advanced over a glidewire in the distal left pulmonary artery. Contrast was injected for pulmonary arteriography. It was then exchanged over a rose in wire for a 12 cm EKOS infusion catheter. The sheaths and catheters were sewn in place. Lysis was instituted with tPA at 1 milligram/hour. FINDINGS: Bilateral pulmonary arteriography confirms proper position of the right and left pulmonary artery catheter is within the pulmonary arterial tree. Final imaging demonstrates appropriate position of the bilateral infusion catheters within the  right and left pulmonary arteries. IMPRESSION: Successful institution of EKOS pulmonary artery lysis for submassive pulmonary thromboembolism. Electronically Signed   By: Marybelle Killings M.D.   On: 02/08/2018 08:41   Dg Chest Port 1 View  Result Date: 02/07/2018 CLINICAL DATA:  Pulmonary edema EXAM: PORTABLE CHEST 1 VIEW COMPARISON:  02/06/2018 FINDINGS: Prior median sternotomy. Infusion catheters are noted in the pulmonary arteries bilaterally. Mild elevation of the right hemidiaphragm. Small right pleural effusion with right base atelectasis. Left lung clear. IMPRESSION: Small right pleural effusion with right base atelectasis. Electronically Signed   By: Rolm Baptise M.D.   On: 02/07/2018 07:09   Dg Chest Port 1 View  Result Date: 02/06/2018 CLINICAL DATA:  Shortness of breath EXAM: PORTABLE CHEST 1 VIEW COMPARISON:  CT chest 01/03/2018 FINDINGS:  Chronic partially loculated right pleural effusion. No focal consolidation. No pneumothorax. No left pleural effusion. Stable cardiomediastinal silhouette. Surgical clips in the right hilum. Prior median sternotomy. The osseous structures are unremarkable. IMPRESSION: 1. No acute cardiopulmonary disease. 2. Stable small loculated right pleural effusion. Electronically Signed   By: Kathreen Devoid   On: 02/06/2018 11:21   Ir Infusion Thrombol Arterial Initial (ms)  Result Date: 02/08/2018 INDICATION: Sub massive pulmonary thromboembolism EXAM: PULMONARY ARTERY LYSIS COMPARISON:  None MEDICATIONS: None. ANESTHESIA/SEDATION: Versed 2 mg IV; Fentanyl 100 mcg IV Moderate Sedation Time:  58 minutes The patient was continuously monitored during the procedure by the interventional radiology nurse under my direct supervision. FLUOROSCOPY TIME:  Fluoroscopy Time: 10 minutes 18 seconds (60 mGy). COMPLICATIONS: None immediate. TECHNIQUE: Informed written consent was obtained from the patient after a thorough discussion of the procedural risks, benefits and alternatives. All  questions were addressed. Maximal Sterile Barrier Technique was utilized including caps, mask, sterile gowns, sterile gloves, sterile drape, hand hygiene and skin antiseptic. A timeout was performed prior to the initiation of the procedure. The right groin was prepped and draped in a sterile fashion. 1% lidocaine was utilized for local anesthesia. Under sonographic guidance, a micropuncture needle was inserted into the right common femoral vein and removed over a 018 wire. Wire was up sized to a Bentson. A 7 French sheath was inserted. An additional 7 French sheath was placed in a similar fashion in the right common femoral vein. Sonographic documentation was obtained. The common femoral vein was noted to be patent by sonographic imaging. A vertebral catheter was advanced over the Bentson wire to the right atrium. It was then advanced over a glidewire into the main pulmonary artery. Pulmonary artery pressure was obtained at 47/22 with a mean of 32 mm Hg. The catheter was advanced over the glidewire to the distal right pulmonary artery. Pulmonary arteriography was performed. The vertebral catheter was exchanged over a rose in wire for a 12 cm EKOS pulmonary artery infusion catheter. The vertebral catheter was then advanced over a Bentson wire through the second sheath to the right atrium. It was then advanced over a glidewire in the distal left pulmonary artery. Contrast was injected for pulmonary arteriography. It was then exchanged over a rose in wire for a 12 cm EKOS infusion catheter. The sheaths and catheters were sewn in place. Lysis was instituted with tPA at 1 milligram/hour. FINDINGS: Bilateral pulmonary arteriography confirms proper position of the right and left pulmonary artery catheter is within the pulmonary arterial tree. Final imaging demonstrates appropriate position of the bilateral infusion catheters within the right and left pulmonary arteries. IMPRESSION: Successful institution of EKOS pulmonary  artery lysis for submassive pulmonary thromboembolism. Electronically Signed   By: Marybelle Killings M.D.   On: 02/08/2018 08:41   Ir Infusion Thrombol Arterial Initial (ms)  Result Date: 02/08/2018 INDICATION: Sub massive pulmonary thromboembolism EXAM: PULMONARY ARTERY LYSIS COMPARISON:  None MEDICATIONS: None. ANESTHESIA/SEDATION: Versed 2 mg IV; Fentanyl 100 mcg IV Moderate Sedation Time:  58 minutes The patient was continuously monitored during the procedure by the interventional radiology nurse under my direct supervision. FLUOROSCOPY TIME:  Fluoroscopy Time: 10 minutes 18 seconds (60 mGy). COMPLICATIONS: None immediate. TECHNIQUE: Informed written consent was obtained from the patient after a thorough discussion of the procedural risks, benefits and alternatives. All questions were addressed. Maximal Sterile Barrier Technique was utilized including caps, mask, sterile gowns, sterile gloves, sterile drape, hand hygiene and skin antiseptic. A timeout was performed prior  to the initiation of the procedure. The right groin was prepped and draped in a sterile fashion. 1% lidocaine was utilized for local anesthesia. Under sonographic guidance, a micropuncture needle was inserted into the right common femoral vein and removed over a 018 wire. Wire was up sized to a Bentson. A 7 French sheath was inserted. An additional 7 French sheath was placed in a similar fashion in the right common femoral vein. Sonographic documentation was obtained. The common femoral vein was noted to be patent by sonographic imaging. A vertebral catheter was advanced over the Bentson wire to the right atrium. It was then advanced over a glidewire into the main pulmonary artery. Pulmonary artery pressure was obtained at 47/22 with a mean of 32 mm Hg. The catheter was advanced over the glidewire to the distal right pulmonary artery. Pulmonary arteriography was performed. The vertebral catheter was exchanged over a rose in wire for a 12 cm EKOS  pulmonary artery infusion catheter. The vertebral catheter was then advanced over a Bentson wire through the second sheath to the right atrium. It was then advanced over a glidewire in the distal left pulmonary artery. Contrast was injected for pulmonary arteriography. It was then exchanged over a rose in wire for a 12 cm EKOS infusion catheter. The sheaths and catheters were sewn in place. Lysis was instituted with tPA at 1 milligram/hour. FINDINGS: Bilateral pulmonary arteriography confirms proper position of the right and left pulmonary artery catheter is within the pulmonary arterial tree. Final imaging demonstrates appropriate position of the bilateral infusion catheters within the right and left pulmonary arteries. IMPRESSION: Successful institution of EKOS pulmonary artery lysis for submassive pulmonary thromboembolism. Electronically Signed   By: Marybelle Killings M.D.   On: 02/08/2018 08:41   Ir Jacolyn Reedy F/u Elizabeth Sauer Art/ven Final Day (ms)  Result Date: 02/07/2018 INDICATION: 67 year old male with a history of symptomatic pulmonary embolism, status post catheter directed local thrombolysis EXAM: PE LYSIS FOLLOW-UP COMPARISON:  CT 02/06/2018, 02/06/2018 MEDICATIONS: None. ANESTHESIA/SEDATION: None FLUOROSCOPY TIME:  None COMPLICATIONS: None TECHNIQUE: Informed written consent was obtained from the patient after a thorough discussion of the procedural risks, benefits and alternatives. All questions were addressed. Maximal Sterile Barrier Technique was utilized including caps, mask, sterile gowns, sterile gloves, sterile drape, hand hygiene and skin antiseptic. A timeout was performed prior to the initiation of the procedure. Transduction of the left and right pulmonary arterial lysis catheter performed at the bedside. Once the pressures were measured, all catheters were removed. Venous sheath removed from the right common femoral vein approach. Sterile bandages were placed. Patient tolerated the procedure well and  remained hemodynamically stable throughout. No complications were encountered and no significant blood loss. FINDINGS: Left PA pressure: 28 Right PA pressure: 25 Measurements performed 11 a.m. 02/07/2018 IMPRESSION: Status post bedside transduction of PA lytic catheters for repeat pressure measurement, as above. Signed, Dulcy Fanny. Dellia Nims, RPVI [facility] Electronically Signed   By: Corrie Mckusick D.O.   On: 02/07/2018 15:56    Assessment & Plan:   Gurvir was seen today for anemia.  Diagnoses and all orders for this visit:  Type II diabetes mellitus with manifestations ([place])-  His blood sugars are well controlled. -     Hemoglobin A1c; Future -     Comprehensive metabolic panel; Future  Deficiency anemia- His H&H are a little worse since he was discharged.  His iron level is low.  Will treat him for iron deficiency.  He also has  an elevated ferritin level so there may be some anemia of chronic disease. -     CBC with Differential/Platelet; Future -     IBC panel; Future -     Vitamin B12; Future -     Folate; Future -     Vitamin B1; Future -     Ferritin; Future -     Comprehensive metabolic panel; Future  Fever, unspecified fever cause- His WBC count is normal.  His CRP is in the normal range but his sed rate is slightly elevated.  Based on his symptoms, exam, and blood in the urine I am concerned that the fever is caused by acute prostatitis.  Urine culture is pending at this time. -     CULTURE, URINE COMPREHENSIVE; Future -     Comprehensive metabolic panel; Future -     Sedimentation rate; Future -     C-reactive protein; Future  Gross hematuria -     Urinalysis, Routine w reflex microscopic; Future -     CULTURE, URINE COMPREHENSIVE; Future  Acute prostatitis with hematuria -     sulfamethoxazole-trimethoprim (BACTRIM DS,SEPTRA DS) 800-160 MG tablet; Take 1 tablet by mouth 2 (two) times daily.  Other iron deficiency  anemia -     ferrous sulfate 325 (65 FE) MG tablet; Take 1 tablet (325 mg total) by mouth 2 (two) times daily with a meal.   I have discontinued Jeneen Rinks A. Mccarn "Jim"'s bisacodyl, docusate sodium, and polyethylene glycol. I am also having him start on sulfamethoxazole-trimethoprim and ferrous sulfate. Additionally, I am having him maintain his Fish Oil, acetaminophen, potassium chloride SA, traMADol, metoprolol tartrate, atorvastatin, TAGRISSO, sodium chloride, furosemide, and Rivaroxaban.  Meds ordered this encounter  Medications  . sulfamethoxazole-trimethoprim (BACTRIM DS,SEPTRA DS) 800-160 MG tablet    Sig: Take 1 tablet by mouth 2 (two) times daily.    Dispense:  60 tablet    Refill:  0  . ferrous sulfate 325 (65 FE) MG tablet    Sig: Take 1 tablet (325 mg total) by mouth 2 (two) times daily with a meal.    Dispense:  180 tablet    Refill:  1     Follow-up: Return in about 3 weeks (around 03/08/2018).  [name], MD

## 2018-02-15 NOTE — Patient Instructions (Signed)
Prostatitis Prostatitis is swelling or inflammation of the prostate gland. The prostate is a walnut-sized gland that is involved in the production of semen. It is located below a man's bladder, in front of the rectum. There are four types of prostatitis:  Chronic nonbacterial prostatitis. This is the most common type of prostatitis. It may be associated with a viral infection or autoimmune disorder.  Acute bacterial prostatitis. This is the least common type of prostatitis. It starts quickly and is usually associated with a bladder infection, high fever, and shaking chills. It can occur at any age.  Chronic bacterial prostatitis. This type usually results from acute bacterial prostatitis that happens repeatedly (is recurrent) or has not been treated properly. It can occur in men of any age but is most common among middle-aged men whose prostate has begun to get larger. The symptoms are not as severe as symptoms caused by acute bacterial prostatitis.  Prostatodynia or chronic pelvic pain syndrome (CPPS). This type is also called pelvic floor disorder. It is associated with increased muscular tone in the pelvis surrounding the prostate. What are the causes? Bacterial prostatitis is caused by infection from bacteria. Chronic nonbacterial prostatitis may be caused by:  Urinary tract infections (UTIs).  Nerve damage.  A response by the body's disease-fighting system (autoimmune response).  Chemicals in the urine. The causes of the other types of prostatitis are usually not known. What are the signs or symptoms? Symptoms of this condition vary depending upon the type of prostatitis. If you have acute bacterial prostatitis, you may experience:  Urinary symptoms, such as:  Painful urination.  Burning during urination.  Frequent and sudden urges to urinate.  Inability to start urinating.  A weak or interrupted stream of urine.  Vomiting.  Nausea.  Fever.  Chills.  Inability to  empty the bladder completely.  Pain in the:  Muscles or joints.  Lower back.  Lower abdomen. If you have any of the other types of prostatitis, you may experience:  Urinary symptoms, such as:  Sudden urges to urinate.  Frequent urination.  Difficulty starting urination.  Weak urine stream.  Dribbling after urination.  Discharge from the urethra. The urethra is a tube that opens at the end of the penis.  Pain in the:  Testicles.  Penis or tip of the penis.  Rectum.  Area in front of the rectum and below the scrotum (perineum).  Problems with sexual function.  Painful ejaculation.  Bloody semen. How is this diagnosed? This condition may be diagnosed based on:  A physical and medical exam.  Your symptoms.  A urine test to check for bacteria.  An exam in which a health care provider uses a finger to feel the prostate (digital rectal exam).  A test of a sample of semen.  Blood tests.  Ultrasound.  Removal of prostate tissue to be examined under a microscope (biopsy).  Tests to check how your body handles urine (urodynamic tests).  A test to look inside your bladder or urethra (cystoscopy). How is this treated? Treatment for this condition depends on the type of prostatitis. Treatment may involve:  Medicines to relieve pain or inflammation.  Medicines to help relax your muscles.  Physical therapy.  Heat therapy.  Techniques to help you control certain body functions (biofeedback).  Relaxation exercises.  Antibiotic medicine, if your condition is caused by bacteria.  Warm water baths (sitz baths). Sitz baths help with relaxing your pelvic floor muscles, which helps to relieve pressure on the prostate. Follow   these instructions at home:  Take over-the-counter and prescription medicines only as told by your health care provider.  If you were prescribed an antibiotic, take it as told by your health care provider. Do not stop taking the  antibiotic even if you start to feel better.  If physical therapy, biofeedback, or relaxation exercises were prescribed, do exercises as instructed.  Take sitz baths as directed by your health care provider. For a sitz bath, sit in warm water that is deep enough to cover your hips and buttocks.  Keep all follow-up visits as told by your health care provider. This is important. Contact a health care provider if:  Your symptoms get worse.  You have a fever. Get help right away if:  You have chills.  You feel nauseous.  You vomit.  You feel light-headed or feel like you are going to faint.  You are unable to urinate.  You have blood or blood clots in your urine. This information is not intended to replace advice given to you by your health care provider. Make sure you discuss any questions you have with your health care provider. Document Released: 04/24/2000 Document Revised: 01/16/2016 Document Reviewed: 01/16/2016 [employer] Interactive Patient Education  2017 [employer]  

## 2018-02-16 ENCOUNTER — Encounter: Payer: Self-pay | Admitting: Internal Medicine

## 2018-02-16 MED ORDER — FERROUS SULFATE 325 (65 FE) MG PO TABS: 325.0000 mg | ORAL_TABLET | Freq: Two times a day (BID) | ORAL | 1 refills | Status: DC

## 2018-02-17 ENCOUNTER — Inpatient Hospital Stay: Payer: Medicare Other | Admitting: Internal Medicine

## 2018-02-17 LAB — CULTURE, URINE COMPREHENSIVE
MICRO NUMBER:: 91208282
RESULT:: NO GROWTH
SPECIMEN QUALITY:: ADEQUATE

## 2018-02-18 ENCOUNTER — Encounter: Payer: Self-pay | Admitting: Internal Medicine

## 2018-02-20 LAB — VITAMIN B1: Vitamin B1 (Thiamine): 9 nmol/L (ref 8–30)

## 2018-02-24 NOTE — Progress Notes (Signed)
Cardiology Office Note    Date:  02/25/2018   ID:  [name], DOB [birth year], MRN [id number]  PCP:  [name], MD  Cardiologist:  [name]   Chief Complaint  Patient presents with  . Follow-up    no concerns,  no chest pain  . Shortness of Breath    History of Present Illness:  James [name] is a 67 y.o. male with PMH of DM II, HLD, and severe MR. he was originally noted to have a loud murmur by his primary care physician.  Initial echocardiogram suggested mild MR, however it also suggested partially flail leaflet.  MR was thought to be underestimated.  TEE showed a flail P3 segment with ruptured cord and a severe MR.  Cardiac catheterization performed on 08/11/2016 showed 65% mid to distal LAD lesion, FFR borderline at 0.81, EF 65%.  Normal cardiac output and RV/LV filling pressure.  He had a PET scan on 08/17/2016 that showed a hypermetabolic solid 2.6 cm anterior right upper lobe pulmonary nodule compatible with prior bronchogenic, no hypermetabolic thoracic lymphadenopathy or distal metastatic disease.  There were nonspecific and mild asymmetric hypermetabolism in the left pontine tonsil and the left tongue base region.  CT surgery recommended lobectomy for his lung mass with possible CABG with LIMA to LAD and MV repair.  He underwent right upper lobe lobectomy in April.  Pathology positive for adenocarcinoma stage Ia.  Serial CT scan was recommended by oncology.  Post procedure, he became very short of breath.  CTA was positive for a small PE, he was started on Xarelto.  Repeat echocardiogram obtained on 02/15/2017 showed EF 60-65%, persistent MR.  He eventually underwent mitral valve repair with Sorin Carbomedics Annuloglex size 28, LIMA to LAD by Dr. Roxy Manns. He was DC on coumadin instead of Xarelto. Completed 6 months of anticoagulation for provoked PE and then it was stopped.   When last seen in April he was progressing well. More recently in August CT showed a right pleural  effusion. Thoracentesis was positive for recurrent CA. He was started on Tagrisso by Dr. Inda Merlin. He was admitted on February 06, 2018 with submassive PE. He was treated with  EKOS with bilateral intra-arterial TPA, patient completed 6 days of IV heparin.The original plan was to transition to Xarelto after 5 days of IV heparin. However he developed left flank pain, hematuria and right leg painful swelling. Hence IV heparin was continued. Both Urology andOIrthopedics evaluated and cleared for transitioning to [place].  Xarelto was started on 02/12/2018.  Hematuria decreased  Right leg pain also improved. MRI did show evidence of hematoma that was improving. He had chronic DVT in right peroneal vein.  Since patient was started on Xarelto, aspirin discontinued to minimize bleeding risk.  On follow up today he is making progress. He notes that prior to this hospitalization he was able to walk 3 miles in an hour and thought this was his new normal. Now he feels more tired and fatigued. He notes he is SOB with exertion. No chest pain. He has some intermittent pain in his neck radiating down both the left and right arms to his hands at times. He is on iron therapy. He was stared on [place] post hospitalization by Dr. Ronnald Ramp for fever and ?UTI.   Past Medical History:  Diagnosis Date  . Adenocarcinoma of right lung, stage 1 ([place]) 09/06/2016  . Anxiety   . Arthritis    "knees, hips, back" (10/19/2012)  . Chronic diastolic  congestive heart failure ([place])   . Chronic lower back pain   . Colon polyps    10/27/2002, repeat letter 09/17/2007  . Coronary artery disease   . Coronary artery disease involving native coronary artery of native heart without angina pectoris   . Depressive disorder, not elsewhere classified    no meds  . Diabetes mellitus without complication ([place])    diet controlled- no med  (while in hosp 4/18 -elevated cbg  . Dyspnea   . Fasting hyperglycemia   . GERD (gastroesophageal reflux  disease)   . Heart murmur   . Hemoptysis    abnormal CT Chest 01/29/10 - ? new GG changes RUL > not viz on plain cxr 02/26/2010  . Hypertension   . Mitral regurgitation    severe MR 08/2016  . MPN (myeloproliferative neoplasm) ([place])    1st detected 06/04/1998  . Obesity   . OSA on CPAP    last sleep study 10 years ago  . Other and unspecified hyperlipidemia   . Peripheral vascular disease ([place]) 08/2016   after lung surgey small clots in lungs,after hip dvt-5/16  . Pneumonia    4/18  . Positive PPD 1965   "non reactive in 2012" (10/19/2012)  . Routine general medical examination at a health care facility   . S/P CABG x 1 03/24/2017   LIMA to LAD  . S/P mitral valve repair 03/24/2017   Complex valvuloplasty including artificial Gore-tex neochord placement x6 and 28 mm Sorin Annuloflex posterior annuloplasty band  . Special screening for malignant neoplasm of prostate   . Spinal stenosis, unspecified region other than cervical   . Wrist pain, left     Past Surgical History:  Procedure Laterality Date  . ANTERIOR CRUCIATE LIGAMENT REPAIR Left 1967  . CARDIAC CATHETERIZATION  2000  . CHEST TUBE INSERTION Right 10/19/2012   post bronch  . COLONOSCOPY W/ POLYPECTOMY    . CORONARY ARTERY BYPASS GRAFT N/A 03/24/2017   Procedure: CORONARY ARTERY BYPASS GRAFTING (CABG)x1 using left internal mammary artery, LIMA-LAD;  Surgeon: Rexene Alberts, MD;  Location: [facility];  Service: Open Heart Surgery;  Laterality: N/A;  . FLEXIBLE BRONCHOSCOPY  10/19/2012   Flexible video fiberoptic bronchoscopy with electromagnetic navigation and biopsies.  . INTRAVASCULAR PRESSURE WIRE/FFR STUDY N/A 08/11/2016   Procedure: Intravascular Pressure Wire/FFR Study;  Surgeon: [name], MD;  Location: [facility];  Service: Cardiovascular;  Laterality: N/A;  . IR ANGIOGRAM PULMONARY BILATERAL SELECTIVE  02/06/2018  . IR ANGIOGRAM SELECTIVE EACH ADDITIONAL VESSEL  02/06/2018  . IR ANGIOGRAM SELECTIVE EACH  ADDITIONAL VESSEL  02/06/2018  . IR INFUSION THROMBOL ARTERIAL INITIAL (MS)  02/06/2018  . IR INFUSION THROMBOL ARTERIAL INITIAL (MS)  02/06/2018  . IR THROMB F/U EVAL ART/VEN FINAL DAY (MS)  02/07/2018  . IR US GUIDE VASC ACCESS RIGHT  02/06/2018  . LOBECTOMY Right 09/02/2016   Procedure: RIGHT UPPER LOBECTOMY;  Surgeon: Melrose Nakayama, MD;  Location: [facility];  Service: Thoracic;  Laterality: Right;  . LYMPH NODE DISSECTION Right 09/02/2016   Procedure: LYMPH NODE DISSECTION, RIGHT LUNG;  Surgeon: Melrose Nakayama, MD;  Location: [facility];  Service: Thoracic;  Laterality: Right;  . MITRAL VALVE REPAIR N/A 03/24/2017   Procedure: MITRAL VALVE REPAIR (MVR) with Sorin Carbomedics Annuloflex size 28;  Surgeon: Rexene Alberts, MD;  Location: [facility];  Service: Open Heart Surgery;  Laterality: N/A;  . RIGHT/LEFT HEART CATH AND CORONARY ANGIOGRAPHY N/A 08/11/2016   Procedure: Right/Left  Heart Cath and Coronary Angiography;  Surgeon: [name], MD;  Location: [facility];  Service: Cardiovascular;  Laterality: N/A;  . TEE WITHOUT CARDIOVERSION N/A 07/17/2016   Procedure: TRANSESOPHAGEAL ECHOCARDIOGRAM (TEE);  Surgeon: [name], MD;  Location[facility] END[facility];  Service: Cardiovascular;  Laterality: N/A;  . TEE WITHOUT CARDIOVERSION N/A 03/24/2017   Procedure: TRANSESOPHAGEAL ECHOCARDIOGRAM (TEE);  Surgeon: Rexene Alberts, MD;  Location: [facility];  Service: Open Heart Surgery;  Laterality: N/A;  . TONSILLECTOMY  1950's  . TOTAL HIP ARTHROPLASTY Left 10/05/2014   dr [name]  . TOTAL HIP ARTHROPLASTY Left 10/05/2014   Procedure: LEFT TOTAL HIP ARTHROPLASTY ANTERIOR APPROACH;  Surgeon: [name], MD;  Location: [facility];  Service: Orthopedics;  Laterality: Left;  Marland Kitchen VIDEO ASSISTED THORACOSCOPY (VATS)/WEDGE RESECTION Right 09/02/2016   Procedure: VIDEO ASSISTED THORACOSCOPY (VATS)/RIGHT UPPER LOBE WEDGE RESECTION;  Surgeon: Melrose Nakayama, MD;  Location: [facility];  Service: Thoracic;  Laterality:  Right;  Marland Kitchen VIDEO BRONCHOSCOPY WITH ENDOBRONCHIAL NAVIGATION N/A 10/19/2012   Procedure: VIDEO BRONCHOSCOPY WITH ENDOBRONCHIAL NAVIGATION;  Surgeon: [name], MD;  Location: [facility];  Service: Thoracic;  Laterality: N/A;  . WRIST RECONSTRUCTION Left 12/2009   'proximal row carpectomy" [name]    Current Medications: Outpatient Medications Prior to Visit  Medication Sig Dispense Refill  . acetaminophen (TYLENOL) 325 MG tablet Take 650 mg by mouth every 6 (six) hours as needed for moderate pain or headache.    Marland Kitchen atorvastatin (LIPITOR) 20 MG tablet TAKE 1 TABLET (20 MG TOTAL) BY MOUTH DAILY. 90 tablet 0  . ferrous sulfate 325 (65 FE) MG tablet Take 1 tablet (325 mg total) by mouth 2 (two) times daily with a meal. 180 tablet 1  . furosemide (LASIX) 40 MG tablet Take 1 tablet (40 mg total) by mouth as needed for fluid.    . Omega-3 Fatty Acids (FISH OIL) 500 MG CAPS Take 500 mg by mouth daily.     . potassium chloride SA (K-DUR,KLOR-CON) 20 MEQ tablet Take 1 tablet (20 mEq total) by mouth daily as needed. With lasix 90 tablet 0  . Rivaroxaban 15 & 20 MG TBPK Take as directed on package: Start with one 15mg  tablet by mouth twice a day with food. On Day 22, switch to one 20mg  tablet once a day with food. PHARMACY: Please discard the first 2 tablets from the starter pack which he received in the hospital. 51 each 0  . sodium chloride (OCEAN) 0.65 % SOLN nasal spray Place 1 spray into both nostrils as needed for congestion.    . sulfamethoxazole-trimethoprim (BACTRIM DS,SEPTRA DS) 800-160 MG tablet Take 1 tablet by mouth 2 (two) times daily. 60 tablet 0  . TAGRISSO 80 MG tablet TAKE 1 TABLET (80 MG TOTAL) BY MOUTH DAILY. (Patient taking differently: Take 80 mg by mouth daily. ) 30 tablet 2  . traMADol (ULTRAM) 50 MG tablet Take 1 tablet (50 mg total) by mouth every 6 (six) hours as needed (may take one or two tablets every six hrs prn). 30 tablet 0  . metoprolol tartrate (LOPRESSOR) 25 MG tablet TAKE 1/2  TABLET BY MOUTH 2 TIMES DAILY (Patient taking differently: Take 12.5 mg by mouth 2 (two) times daily. ) 30 tablet 11   No facility-administered medications prior to visit.      Allergies:   Symbicort [budesonide-formoterol fumarate] and Amoxicillin   Social History   Socioeconomic History  . Marital status: Married    Spouse name: Not on  file  . Number of children: 3  . Years of education: Not on file  . Highest education level: Not on file  Occupational History  . Occupation: Retired, Financial risk analyst  . Occupation: Retired, Real estate    Comment: [place]  . Financial resource strain: Not on file  . Food insecurity:    Worry: Patient refused    Inability: Patient refused  . Transportation needs:    Medical: Patient refused    Non-medical: Patient refused  Tobacco Use  . Smoking status: Never Smoker  . Smokeless tobacco: Never Used  Substance and Sexual Activity  . Alcohol use: Yes    Alcohol/week: 1.0 standard drinks    Types: 1 Cans of beer per week    Comment: none since 3 weeks  . Drug use: No  . Sexual activity: Never    Partners: Female  Lifestyle  . Physical activity:    Days per week: Patient refused    Minutes per session: Patient refused  . Stress: Not on file  Relationships  . Social connections:    Talks on phone: Not on file    Gets together: Not on file    Attends religious service: Not on file    Active member of club or organization: Not on file    Attends meetings of clubs or organizations: Not on file    Relationship status: Not on file  Other Topics Concern  . Not on file  Social History Narrative   HSG, [employer]. Married '73.  2 sons - '74,   '76;  1 daughter -  '77  6 grandchildren.   Work - IT consultant now retired. ACP - not fully discussed.                     Family History:  The patient's family history includes Heart attack in his father; Heart disease in his father and paternal uncle;  Heart failure in his mother.   ROS:   Please see the history of present illness.    ROS All other systems reviewed and are negative.   PHYSICAL EXAM:   VS:  BP 118/72   Pulse 82   Ht 5\' 9"  (1.753 m)   Wt 216 lb (98 kg)   BMI 31.90 kg/m    GENERAL:  Well appearing WM in NAD HEENT:  PERRL, EOMI, sclera are clear. Oropharynx is clear. NECK:  No jugular venous distention, carotid upstroke brisk and symmetric, no bruits, no thyromegaly or adenopathy LUNGS:  Clear to auscultation bilaterally CHEST:  Unremarkable HEART:  RRR,  PMI not displaced or sustained,S1 and S2 within normal limits, no S3, no S4: no clicks, no rubs, no murmurs ABD:  Soft, nontender. BS +, no masses or bruits. No hepatomegaly, no splenomegaly EXT:  2 + pulses throughout, tr-1+ edema, extensive bruising on arms.  SKIN:  Warm and dry.  No rashes NEURO:  Alert and oriented x 3. Cranial nerves II through XII intact. PSYCH:  Cognitively intact    Wt Readings from Last 3 Encounters:  02/25/18 216 lb (98 kg)  02/15/18 213 lb (96.6 kg)  02/13/18 227 lb 15.3 oz (103.4 kg)      Studies/Labs Reviewed:   EKG:  EKG is not ordered today.   Recent Labs: 07/28/2017: Pro B Natriuretic peptide (BNP) 48.0; TSH 2.56 02/06/2018: B Natriuretic Peptide 120.7 02/07/2018: Magnesium 2.1 02/15/2018: ALT 33; BUN 19; Creatinine, Ser 1.46; Hemoglobin 11.1; Platelets 240.0; Potassium  4.4; Sodium 139   Lipid Panel    Component Value Date/Time   CHOL 177 05/27/2017 0807   TRIG 128 05/27/2017 0807   TRIG 108 05/20/2006 0825   HDL 41 05/27/2017 0807   CHOLHDL 4.3 05/27/2017 0807   CHOLHDL 7 06/29/2016 1153   VLDL 39.6 06/29/2016 1153   LDLCALC 110 (H) 05/27/2017 0807   LDLDIRECT 177.0 07/24/2015 1052    Additional studies/ records that were reviewed today include:    Cath 08/11/2016 Conclusion     Prox LAD to Mid LAD lesion, 30 %stenosed.  Mid LAD to Dist LAD lesion, 65 %stenosed.  Prox RCA to Mid RCA lesion, 15  %stenosed.  The left ventricular systolic function is normal.  LV end diastolic pressure is normal.  The left ventricular ejection fraction is 55-65% by visual estimate.  LV end diastolic pressure is normal.   1. Borderline single vessel obstructive CAD involving the mid LAD. FFR 0.81 2. Normal LV function EF 65% 3. Normal right heart and LV filling pressures 4. Normal Cardiac output  Plan: will refer to CT surgery for consideration of MV repair. The stenosis in the LAD will be discussed. He is asymptomatic and I would favor treating it medically. If he develops symptoms in the future it could be treated with PCI.        Echo 02/15/2017 LV EF: 60% -   65%  Study Conclusions  - Left ventricle: The cavity size was normal. Wall thickness was   normal. Systolic function was normal. The estimated ejection   fraction was in the range of 60% to 65%. - Aortic valve: AV is thickened, calcified with minimally   restricted motion. - Mitral valve: Prolapse fo the posterior mitral leaflet. MR is at   least moderate in intensity Calcified annulus. Mildly thickened   leaflets . - Left atrium: The atrium was mildly dilated.    CABG with MVR 03/24/2017 Preoperative Diagnosis:        Severe Mitral Regurgitation  Single-vessel Coronary Artery Disease  Postoperative Diagnosis:    Same  Procedure:        Mitral Valve Repair             Complex valvuloplasty including artificial Gore-tex neochord placement x6             Sorin Carbomedics Annuloflex posterior annuloplasty band (size 74mm, catalog #[id number], serial #[id number])   Coronary Artery Bypass Grafting x 1              Left Internal Mammary Artery to Distal Left Anterior Descending Coronary Artery   Echo 06/10/17: Study Conclusions  - Left ventricle: The cavity size was normal. There was moderate   concentric hypertrophy. Systolic function was vigorous. The   estimated ejection fraction was in the range of 65% to  70%. Wall   motion was normal; there were no regional wall motion   abnormalities. Doppler parameters are consistent with abnormal   left ventricular relaxation (grade 1 diastolic dysfunction). - Aortic valve: Trileaflet; mildly thickened, mildly calcified   leaflets. Transvalvular velocity was minimally increased. There   was no stenosis. There was no regurgitation. - Mitral valve: S/P mitral valve repair with fixed posterior   leaflet. Transvalvular velocity was elevated. There was no   regurgitation. - Right ventricle: The cavity size was normal. Wall thickness was   normal. Systolic function was normal. - Tricuspid valve: There was no regurgitation. - Pericardium, extracardiac: A mild pericardial effusion was   identified posterior to  the heart. Features were not consistent   with tamponade physiology.  Impressions:  - Since the last study on 02/15/2017 mitral valve is post repair.   Transmitral velocities are elevated with mean gradient 8 mmHg.   LVEF is hyperdynamic.  Echo 02/07/18: Study Conclusions  - Left ventricle: The cavity size was mildly reduced. Wall   thickness was increased in a pattern of mild LVH. Systolic   function was vigorous. The estimated ejection fraction was in the   range of 65% to 70%. - Mitral valve: s/p MV repair. Motion is difficult to see due to   poor acoustic windows Peak and mean gradiaeants through the valve   are 7 and 4 mm Hg respectively. MVA by P T1/2 is 1.75 cm2   consistent with mild MS - Right ventricle: RV is difficult to visualize, even with Definity   OVerall RVEF appears mildly reduced. The cavity size was mildly   dilated. - Right atrium: The atrium was mildly dilated.  ASSESSMENT:    1. Other pulmonary embolism without acute cor pulmonale, unspecified chronicity ([place])   2. S/P mitral valve repair   3. S/P CABG x 1   4. Long term (current) use of anticoagulants   5. Hyperlipidemia with target LDL less than 130       PLAN:  In order of problems listed above:  1. Severe MR s/p mitral valve annuloplasty:  Echo shows an excellent repair.  Recommend routine SBE prophylaxis.    2.   History of PE now recurrent on 02/06/18. Submassive with RV strain. S/p EKOS and lytic therapy. On Xarelto now. Scheduled to transition to 20 mg daily next week. Will need anticoagulation indefinitely. Will be seeing Dr Inda Merlin on Monday. The question is with his stage IV lung CA whether he should be on Lovenox instead.   3.    Chronic diastolic heart failure: weight down 13 lbs.  He does have mild  edema. He takes lasix PRN.  4.    S/p CABG x 1: LIMA to LAD  5.    Stage IV lung CA. S/p resection with recurrent pleural effusion. On oral chemostherapy.   6.   DM 2: Managed by primary care provider  7.   HLD on statin   Medication Adjustments/Labs and Tests Ordered: Current medicines are reviewed at length with the patient today.  Concerns regarding medicines are outlined above.  Medication changes, Labs and Tests ordered today are listed in the Patient Instructions below. Patient Instructions  Continue your current therapy  Follow up in 3 months      Signed, Peter Martinique, MD  02/25/2018 3:27 PM    [facility] [address], [place], [place]  [address] Phone: [phone]; Fax: [phone]

## 2018-02-25 ENCOUNTER — Encounter: Payer: Self-pay | Admitting: Cardiology

## 2018-02-25 ENCOUNTER — Ambulatory Visit: Payer: Medicare Other | Admitting: Cardiology

## 2018-02-25 VITALS — BP 118/72 | HR 82 | Ht 69.0 in | Wt 216.0 lb

## 2018-02-25 DIAGNOSIS — I2699 Other pulmonary embolism without acute cor pulmonale: Secondary | ICD-10-CM | POA: Diagnosis not present

## 2018-02-25 DIAGNOSIS — Z7901 Long term (current) use of anticoagulants: Secondary | ICD-10-CM

## 2018-02-25 DIAGNOSIS — Z951 Presence of aortocoronary bypass graft: Secondary | ICD-10-CM | POA: Diagnosis not present

## 2018-02-25 DIAGNOSIS — Z9889 Other specified postprocedural states: Secondary | ICD-10-CM

## 2018-02-25 DIAGNOSIS — E785 Hyperlipidemia, unspecified: Secondary | ICD-10-CM

## 2018-02-25 MED ORDER — METOPROLOL TARTRATE 25 MG PO TABS: ORAL_TABLET | ORAL | 3 refills | Status: DC

## 2018-02-25 MED ORDER — RIVAROXABAN 20 MG PO TABS: 20.0000 mg | ORAL_TABLET | Freq: Every day | ORAL | 3 refills | Status: DC

## 2018-02-25 NOTE — Patient Instructions (Addendum)
Continue your current therapy  Follow up in 3 months

## 2018-02-28 ENCOUNTER — Inpatient Hospital Stay: Payer: Medicare Other | Attending: Internal Medicine

## 2018-02-28 ENCOUNTER — Encounter: Payer: Self-pay | Admitting: Internal Medicine

## 2018-02-28 ENCOUNTER — Inpatient Hospital Stay (HOSPITAL_BASED_OUTPATIENT_CLINIC_OR_DEPARTMENT_OTHER): Payer: Medicare Other | Admitting: Internal Medicine

## 2018-02-28 ENCOUNTER — Telehealth: Payer: Self-pay | Admitting: Internal Medicine

## 2018-02-28 VITALS — BP 130/73 | HR 71 | Temp 98.8°F | Resp 17 | Ht 69.0 in | Wt 213.5 lb

## 2018-02-28 DIAGNOSIS — I2699 Other pulmonary embolism without acute cor pulmonale: Secondary | ICD-10-CM

## 2018-02-28 DIAGNOSIS — I10 Essential (primary) hypertension: Secondary | ICD-10-CM

## 2018-02-28 DIAGNOSIS — E119 Type 2 diabetes mellitus without complications: Secondary | ICD-10-CM

## 2018-02-28 DIAGNOSIS — J90 Pleural effusion, not elsewhere classified: Secondary | ICD-10-CM | POA: Diagnosis not present

## 2018-02-28 DIAGNOSIS — Z5111 Encounter for antineoplastic chemotherapy: Secondary | ICD-10-CM

## 2018-02-28 DIAGNOSIS — Z7901 Long term (current) use of anticoagulants: Secondary | ICD-10-CM

## 2018-02-28 DIAGNOSIS — Z23 Encounter for immunization: Secondary | ICD-10-CM | POA: Insufficient documentation

## 2018-02-28 DIAGNOSIS — Z85118 Personal history of other malignant neoplasm of bronchus and lung: Secondary | ICD-10-CM

## 2018-02-28 DIAGNOSIS — C3491 Malignant neoplasm of unspecified part of right bronchus or lung: Secondary | ICD-10-CM

## 2018-02-28 LAB — CMP (CANCER CENTER ONLY)
ALT: 46 U/L — ABNORMAL HIGH (ref 0–44)
AST: 29 U/L (ref 15–41)
Albumin: 3.5 g/dL (ref 3.5–5.0)
Alkaline Phosphatase: 72 U/L (ref 38–126)
Anion gap: 10 (ref 5–15)
BUN: 22 mg/dL (ref 8–23)
CO2: 24 mmol/L (ref 22–32)
Calcium: 9.8 mg/dL (ref 8.9–10.3)
Chloride: 108 mmol/L (ref 98–111)
Creatinine: 1.64 mg/dL — ABNORMAL HIGH (ref 0.61–1.24)
GFR, Est AFR Am: 48 mL/min — ABNORMAL LOW (ref >60)
GFR, Estimated: 41 mL/min — ABNORMAL LOW (ref >60)
Glucose, Bld: 106 mg/dL — ABNORMAL HIGH (ref 70–99)
Potassium: 4.9 mmol/L (ref 3.5–5.1)
Sodium: 142 mmol/L (ref 135–145)
Total Bilirubin: 0.4 mg/dL (ref 0.3–1.2)
Total Protein: 7.5 g/dL (ref 6.5–8.1)

## 2018-02-28 LAB — CBC WITH DIFFERENTIAL (CANCER CENTER ONLY)
Abs Immature Granulocytes: 0.04 10*3/uL (ref 0.00–0.07)
Basophils Absolute: 0 10*3/uL (ref 0.0–0.1)
Basophils Relative: 1 %
Eosinophils Absolute: 0.1 10*3/uL (ref 0.0–0.5)
Eosinophils Relative: 2 %
HCT: 37.4 % — ABNORMAL LOW (ref 39.0–52.0)
Hemoglobin: 11.7 g/dL — ABNORMAL LOW (ref 13.0–17.0)
Immature Granulocytes: 1 %
Lymphocytes Relative: 16 %
Lymphs Abs: 1.1 10*3/uL (ref 0.7–4.0)
MCH: 28.5 pg (ref 26.0–34.0)
MCHC: 31.3 g/dL (ref 30.0–36.0)
MCV: 91.2 fL (ref 80.0–100.0)
Monocytes Absolute: 0.6 10*3/uL (ref 0.1–1.0)
Monocytes Relative: 9 %
Neutro Abs: 5.1 10*3/uL (ref 1.7–7.7)
Neutrophils Relative %: 71 %
Platelet Count: 264 10*3/uL (ref 150–400)
RBC: 4.1 MIL/uL — ABNORMAL LOW (ref 4.22–5.81)
RDW: 15.3 % (ref 11.5–15.5)
WBC Count: 7 10*3/uL (ref 4.0–10.5)
nRBC: 0 % (ref 0.0–0.2)

## 2018-02-28 MED ORDER — INFLUENZA VAC SPLIT HIGH-DOSE 0.5 ML IM SUSY
0.5000 mL | PREFILLED_SYRINGE | Freq: Once | INTRAMUSCULAR | Status: AC
  Administered 2018-02-28: 0.5 mL via INTRAMUSCULAR
  Filled 2018-02-28: qty 0.5

## 2018-02-28 MED ORDER — INFLUENZA VAC SPLIT QUAD 0.5 ML IM SUSY: 0.5000 mL | PREFILLED_SYRINGE | Freq: Once | INTRAMUSCULAR | Status: DC

## 2018-02-28 MED FILL — TAGRISSO 80 MG TABLET: 80 | 30 days supply | Qty: 30 | Fill #1

## 2018-02-28 NOTE — Progress Notes (Signed)
[place] Telephone:([phone]) [phone]   Fax:([phone]) [phone]  OFFICE PROGRESS NOTE  [name], MD [address] [facility] [address] [place] [address]  DIAGNOSIS: Recurrent non-small cell lung cancer, adenocarcinoma initially diagnosed as stage IA (T1c, N0, M0) non-small cell lung cancer, adenocarcinoma presented with right upper lobe lung nodule in April 2018 with recurrence in June 2019.  Biomarker Findings Microsatellite status - MS-Stable Tumor Mutational Burden - TMB-Low (3 Muts/Mb) Genomic Findings For a complete list of the genes assayed, please refer to the Appendix. EGFR exon 19 deletion ([id number]>I) CDKN2A/B loss NKX2-1 amplification 7 Disease relevant genes with no reportable alterations: KRAS, ALK, BRAF, MET, RET, ERBB2, ROS1   PRIOR THERAPY: Status post right upper lobectomy with lymph node dissection under the care of Dr. [name] on 09/02/2016.  CURRENT THERAPY: Tagrisso 80 mg p.o. daily.  First dose started November 05, 2017.  Status post 4 months of treatment.  INTERVAL HISTORY: James [name] 67 y.o. male returns to the clinic today for follow-up visit.  The patient is feeling fine today with no concerning complaints except for mild fatigue and shortness of breath.  He was recently diagnosed with acute pulmonary embolus.  He underwent thrombolytic therapy on February 09, 4331 that was complicated with significant bleeding issues.  The patient denied having any current chest pain but has shortness of breath with exertion with mild cough and no hemoptysis.  He denied having any fever or chills.  He has no nausea, vomiting, diarrhea or constipation.  He has mild bruises in his arm.  He is here today for evaluation and repeat blood work.   MEDICAL HISTORY: Past Medical History:  Diagnosis Date  . Adenocarcinoma of right lung, stage 1 ([place]) 09/06/2016  . Anxiety   . Arthritis    "knees, hips, back" (10/19/2012)  . Chronic diastolic congestive heart  failure ([place])   . Chronic lower back pain   . Colon polyps    10/27/2002, repeat letter 09/17/2007  . Coronary artery disease   . Coronary artery disease involving native coronary artery of native heart without angina pectoris   . Depressive disorder, not elsewhere classified    no meds  . Diabetes mellitus without complication ([place])    diet controlled- no med  (while in hosp 4/18 -elevated cbg  . Dyspnea   . Fasting hyperglycemia   . GERD (gastroesophageal reflux disease)   . Heart murmur   . Hemoptysis    abnormal CT Chest 01/29/10 - ? new GG changes RUL > not viz on plain cxr 02/26/2010  . Hypertension   . Mitral regurgitation    severe MR 08/2016  . MPN (myeloproliferative neoplasm) ([place])    1st detected 06/04/1998  . Obesity   . OSA on CPAP    last sleep study 10 years ago  . Other and unspecified hyperlipidemia   . Peripheral vascular disease ([place]) 08/2016   after lung surgey small clots in lungs,after hip dvt-5/16  . Pneumonia    4/18  . Positive PPD 1965   "non reactive in 2012" (10/19/2012)  . Routine general medical examination at a health care facility   . S/P CABG x 1 03/24/2017   LIMA to LAD  . S/P mitral valve repair 03/24/2017   Complex valvuloplasty including artificial Gore-tex neochord placement x6 and 28 mm Sorin Annuloflex posterior annuloplasty band  . Special screening for malignant neoplasm of prostate   . Spinal stenosis, unspecified region other than cervical   .  Wrist pain, left     ALLERGIES:  is allergic to symbicort [budesonide-formoterol fumarate] and amoxicillin.  MEDICATIONS:  Current Outpatient Medications  Medication Sig Dispense Refill  . acetaminophen (TYLENOL) 325 MG tablet Take 650 mg by mouth every 6 (six) hours as needed for moderate pain or headache.    Marland Kitchen atorvastatin (LIPITOR) 20 MG tablet TAKE 1 TABLET (20 MG TOTAL) BY MOUTH DAILY. 90 tablet 0  . ferrous sulfate 325 (65 FE) MG tablet Take 1 tablet (325 mg total) by mouth 2 (two)  times daily with a meal. 180 tablet 1  . furosemide (LASIX) 40 MG tablet Take 1 tablet (40 mg total) by mouth as needed for fluid.    . metoprolol tartrate (LOPRESSOR) 25 MG tablet TAKE 1/2 TABLET BY MOUTH 2 TIMES DAILY 90 tablet 3  . Omega-3 Fatty Acids (FISH OIL) 500 MG CAPS Take 500 mg by mouth daily.     . potassium chloride SA (K-DUR,KLOR-CON) 20 MEQ tablet Take 1 tablet (20 mEq total) by mouth daily as needed. With lasix 90 tablet 0  . rivaroxaban (XARELTO) 20 MG TABS tablet Take 1 tablet (20 mg total) by mouth daily with supper. 90 tablet 3  . Rivaroxaban 15 & 20 MG TBPK Take as directed on package: Start with one 104m tablet by mouth twice a day with food. On Day 22, switch to one 247mtablet once a day with food. PHARMACY: Please discard the first 2 tablets from the starter pack which he received in the hospital. 51 each 0  . sodium chloride (OCEAN) 0.65 % SOLN nasal spray Place 1 spray into both nostrils as needed for congestion.    . sulfamethoxazole-trimethoprim (BACTRIM DS,SEPTRA DS) 800-160 MG tablet Take 1 tablet by mouth 2 (two) times daily. 60 tablet 0  . TAGRISSO 80 MG tablet TAKE 1 TABLET (80 MG TOTAL) BY MOUTH DAILY. (Patient taking differently: Take 80 mg by mouth daily. ) 30 tablet 2  . traMADol (ULTRAM) 50 MG tablet Take 1 tablet (50 mg total) by mouth every 6 (six) hours as needed (may take one or two tablets every six hrs prn). 30 tablet 0   No current facility-administered medications for this visit.     SURGICAL HISTORY:  Past Surgical History:  Procedure Laterality Date  . ANTERIOR CRUCIATE LIGAMENT REPAIR Left 1967  . CARDIAC CATHETERIZATION  2000  . CHEST TUBE INSERTION Right 10/19/2012   post bronch  . COLONOSCOPY W/ POLYPECTOMY    . CORONARY ARTERY BYPASS GRAFT N/A 03/24/2017   Procedure: CORONARY ARTERY BYPASS GRAFTING (CABG)x1 using left internal mammary artery, LIMA-LAD;  Surgeon: OwRexene AlbertsMD;  [facility] Service: Open Heart Surgery;   Laterality: N/A;  . FLEXIBLE BRONCHOSCOPY  10/19/2012   Flexible video fiberoptic bronchoscopy with electromagnetic navigation and biopsies.  . INTRAVASCULAR PRESSURE WIRE/FFR STUDY N/A 08/11/2016   Procedure: Intravascular Pressure Wire/FFR Study;  Surgeon: Peter M JoMartiniqueMD;  [facility];  Service: Cardiovascular;  Laterality: N/A;  . IR ANGIOGRAM PULMONARY BILATERAL SELECTIVE  02/06/2018  . IR ANGIOGRAM SELECTIVE EACH ADDITIONAL VESSEL  02/06/2018  . IR ANGIOGRAM SELECTIVE EACH ADDITIONAL VESSEL  02/06/2018  . IR INFUSION THROMBOL ARTERIAL INITIAL (MS)  02/06/2018  . IR INFUSION THROMBOL ARTERIAL INITIAL (MS)  02/06/2018  . IR THROMB F/U EVAL ART/VEN FINAL DAY (MS)  02/07/2018  . IR [place]UIDE VASC ACCESS RIGHT  02/06/2018  . LOBECTOMY Right 09/02/2016   Procedure: RIGHT UPPER LOBECTOMY;  Surgeon: StRemo Lipps  Chaya Jan, MD;  Location: [facility];  Service: Thoracic;  Laterality: Right;  . LYMPH NODE DISSECTION Right 09/02/2016   Procedure: LYMPH NODE DISSECTION, RIGHT LUNG;  Surgeon: [place] Nakayama, MD;  Location: [facility];  Service: Thoracic;  Laterality: Right;  . MITRAL VALVE REPAIR N/A 03/24/2017   Procedure: MITRAL VALVE REPAIR (MVR) with Sorin Carbomedics Annuloflex size 28;  Surgeon: Rexene Alberts, MD;  Location: [facility];  Service: Open Heart Surgery;  Laterality: N/A;  . RIGHT/LEFT HEART CATH AND CORONARY ANGIOGRAPHY N/A 08/11/2016   Procedure: Right/Left Heart Cath and Coronary Angiography;  Surgeon: [name], MD;  Location: [facility];  Service: Cardiovascular;  Laterality: N/A;  . TEE WITHOUT CARDIOVERSION N/A 07/17/2016   Procedure: TRANSESOPHAGEAL ECHOCARDIOGRAM (TEE);  Surgeon: [name], MD;  Location[facility] ENDOSCOPY;  Service: Cardiovascular;  Laterality: N/A;  . TEE WITHOUT CARDIOVERSION N/A 03/24/2017   Procedure: TRANSESOPHAGEAL ECHOCARDIOGRAM (TEE);  Surgeon: Rexene Alberts, MD;  Location: [facility];  Service: Open Heart Surgery;  Laterality: N/A;  .  TONSILLECTOMY  1950's  . TOTAL HIP ARTHROPLASTY Left 10/05/2014   dr [name]  . TOTAL HIP ARTHROPLASTY Left 10/05/2014   Procedure: LEFT TOTAL HIP ARTHROPLASTY ANTERIOR APPROACH;  Surgeon: [name], MD;  Location: [facility];  Service: Orthopedics;  Laterality: Left;  Marland Kitchen VIDEO ASSISTED THORACOSCOPY (VATS)/WEDGE RESECTION Right 09/02/2016   Procedure: VIDEO ASSISTED THORACOSCOPY (VATS)/RIGHT UPPER LOBE WEDGE RESECTION;  Surgeon: [place] Nakayama, MD;  Location: [facility];  Service: Thoracic;  Laterality: Right;  Marland Kitchen VIDEO BRONCHOSCOPY WITH ENDOBRONCHIAL NAVIGATION N/A 10/19/2012   Procedure: VIDEO BRONCHOSCOPY WITH ENDOBRONCHIAL NAVIGATION;  Surgeon: [name], MD;  Location: [facility];  Service: Thoracic;  Laterality: N/A;  . WRIST RECONSTRUCTION Left 12/2009   'proximal row carpectomy" [name]    REVIEW OF SYSTEMS:  A comprehensive review of systems was negative except for: Constitutional: positive for fatigue Respiratory: positive for dyspnea on exertion   PHYSICAL EXAMINATION: General appearance: alert, cooperative, fatigued and no distress Head: Normocephalic, without obvious abnormality, atraumatic Neck: no adenopathy, no JVD, supple, symmetrical, trachea midline and thyroid not enlarged, symmetric, no tenderness/mass/nodules Lymph nodes: Cervical, supraclavicular, and axillary nodes normal. Resp: clear to auscultation bilaterally Back: symmetric, no curvature. ROM normal. No CVA tenderness. Cardio: regular rate and rhythm, S1, S2 normal, no murmur, click, rub or gallop GI: soft, non-tender; bowel sounds normal; no masses,  no organomegaly Extremities: extremities normal, atraumatic, no cyanosis or edema  ECOG PERFORMANCE STATUS: 1 - Symptomatic but completely ambulatory  Blood pressure 130/73, pulse 71, temperature 98.8 F (37.1 C), temperature source Oral, resp. rate 17, height _0  (1.753 m), weight 213 lb 8 oz (96.8 kg), SpO2 98 %.  LABORATORY DATA: Lab Results  Component Value  Date   WBC 7.0 02/28/2018   HGB 11.7 (L) 02/28/2018   HCT 37.4 (L) 02/28/2018   MCV 91.2 02/28/2018   PLT 264 02/28/2018      Chemistry      Component Value Date/Time   NA 139 02/15/2018 0920   NA 142 10/01/2016 1534   K 4.4 02/15/2018 0920   K 4.2 10/01/2016 1534   CL 103 02/15/2018 0920   CO2 28 02/15/2018 0920   CO2 28 10/01/2016 1534   BUN 19 02/15/2018 0920   BUN 15.9 10/01/2016 1534   CREATININE 1.46 02/15/2018 0920   CREATININE 1.22 01/03/2018 1319   CREATININE 1.10 02/08/2017 1101   CREATININE 1.5 (H) 10/01/2016 1534  Component Value Date/Time   CALCIUM 9.0 02/15/2018 0920   CALCIUM 9.6 10/01/2016 1534   ALKPHOS 48 02/15/2018 0920   ALKPHOS 65 10/01/2016 1534   AST 37 02/15/2018 0920   AST 33 01/03/2018 1319   AST 20 10/01/2016 1534   ALT 33 02/15/2018 0920   ALT 55 (H) 01/03/2018 1319   ALT 24 10/01/2016 1534   BILITOT 0.8 02/15/2018 0920   BILITOT 0.7 01/03/2018 1319   BILITOT 0.46 10/01/2016 1534       RADIOGRAPHIC STUDIES: Ct Abdomen Pelvis W Wo Contrast  Result Date: 02/11/2018 CLINICAL DATA:  Hematuria.  History of lung cancer and PE. EXAM: CT ABDOMEN AND PELVIS WITHOUT AND WITH CONTRAST TECHNIQUE: Multidetector CT imaging of the abdomen and pelvis was performed following the standard protocol before and following the bolus administration of intravenous contrast. CONTRAST:  162m OMNIPAQUE IOHEXOL 350 MG/ML SOLN COMPARISON:  01/03/2018 FINDINGS: Lower chest: Small right pleural effusion. Hepatobiliary: 4.6 cm cyst identified posterior right liver. 2.3 cm apparent cyst identified in the lateral segment left liver. There is no evidence for gallstones, gallbladder wall thickening, or pericholecystic fluid. No intrahepatic or extrahepatic biliary dilation. Pancreas: No focal mass lesion. No dilatation of the main duct. No intraparenchymal cyst. No peripancreatic edema. Spleen: No splenomegaly. No focal mass lesion. Adrenals/Urinary Tract: No adrenal nodule  or mass. Right kidney unremarkable. Right ureter unremarkable. Previous CT showed multiple central sinus cysts in the left kidney. Many of these central sinus cysts have increased substantially in attenuation in the interval compatible with hemorrhage there is left perinephric edema. No left hydroureter although there is left periureteric edema. Stomach/Bowel: Stomach is nondistended. No gastric wall thickening. No evidence of outlet obstruction. Duodenum is normally positioned as is the ligament of Treitz. No small bowel wall thickening. No small bowel dilatation. The terminal ileum is normal. The appendix is not visualized, but there is no edema or inflammation in the region of the cecum. No gross colonic mass. No colonic wall thickening. No substantial diverticular change. Vascular/Lymphatic: There is abdominal aortic atherosclerosis without aneurysm. There is no gastrohepatic or hepatoduodenal ligament lymphadenopathy. No intraperitoneal or retroperitoneal lymphadenopathy. No pelvic sidewall lymphadenopathy. Reproductive: Prostate gland is enlarged. Other: No intraperitoneal free fluid. Musculoskeletal: Stranding in the right groin region may be from recent vascular catheterization. Patient is status post left hip replacement. IMPRESSION: 1. Multiple central sinus cysts in the left kidney have increased in attenuation since the prior study. Imaging features would be compatible with hemorrhage into the cyst and potentially into the left intrarenal collecting system. Etiology for the hemorrhage not evident but underlying nonvisualized urothelial lesion a consideration. 2. Stranding in the right groin, potentially from recent vascular access procedure. 3. Hepatic cysts. 4.  Aortic Atherosclerois (ICD10-170.0) 5. Small right pleural effusion. Electronically Signed   By: EMisty StanleyM.D.   On: 02/11/2018 18:43   Ct Head Wo Contrast  Result Date: 02/06/2018 CLINICAL DATA:  Lung cancer.  Pre tPA. EXAM: CT HEAD  WITHOUT CONTRAST TECHNIQUE: Contiguous axial images were obtained from the base of the skull through the vertex without intravenous contrast. COMPARISON:  10/26/2017 FINDINGS: Brain: No mass effect, midline shift, or acute hemorrhage. Vascular: No hyperdense vessel or unexpected calcification. Skull: Intact. Sinuses/Orbits: No acute finding. Other: Noncontributory IMPRESSION: No acute intracranial pathology. Electronically Signed   By: AMarybelle KillingsM.D.   On: 02/06/2018 17:03   Ct Angio Chest Pe W And/or Wo Contrast  Result Date: 02/06/2018 CLINICAL DATA:  Shortness of breath.  History  of lung carcinoma EXAM: CT ANGIOGRAPHY CHEST WITH CONTRAST TECHNIQUE: Multidetector CT imaging of the chest was performed using the standard protocol during bolus administration of intravenous contrast. Multiplanar CT image reconstructions and MIPs were obtained to evaluate the vascular anatomy. CONTRAST:  100 mL ISOVUE-370 IOPAMIDOL (ISOVUE-370) INJECTION 76% COMPARISON:  Chest CT January 03, 2018; chest radiograph February 06, 2018. FINDINGS: Cardiovascular: There is extensive pulmonary embolism arising from the main pulmonary arteries bilaterally and extending into multiple upper and lower lobe pulmonary artery branches. The right ventricle to left ventricle diameter ratio is 1.7, consistent with right heart strain. There is no thoracic aortic aneurysm or dissection. The visualized great vessels appear unremarkable. No pericardial effusion or pericardial thickening. The main pulmonary outflow tract measures 3.1 cm which is prominent. Mediastinum/Nodes: Thyroid appears unremarkable. There is no appreciable thoracic adenopathy. No esophageal lesions are evident. Lungs/Pleura: There is a moderate pleural effusion on the right with atelectatic change in the right base. There are areas of scarring on the right. There is a small granuloma in the right upper lobe. There is a stable 4 mm nodular opacity in the posterior segment right  upper lobe seen on axial slice 50 series 7. There is a stable 4 mm nodular opacity in the superior segment of the right lower lobe seen on axial slice 51 series 7. There is left base atelectasis. There is no edema or consolidation on the left. There is a stable 5 mm nodular opacity in the posterior segment left upper lobe seen on axial slice 26 series 7. No pleural effusion on the left evident. Upper Abdomen: There is reflux of contrast into the inferior vena cava and hepatic veins. There is a cyst arising in the posterior segment right lobe of the liver measuring 4.4 x 3.9 cm. Visualized upper abdominal structures otherwise appear unremarkable. Musculoskeletal: Patient is status post median sternotomy. There is degenerative change in the thoracic spine with diffuse idiopathic skeletal hyperostosis. There are no appreciable blastic or lytic bone lesions. No chest wall lesions are evident. Review of the MIP images confirms the above findings. IMPRESSION: 1. Positive for acute PE with CT evidence of right heart strain (RV/LV Ratio = 1.7) consistent with at least submassive (intermediate risk) PE. The presence of right heart strain has been associated with an increased risk of morbidity and mortality. Please activate Code PE by paging [phone]. Pulmonary emboli arise in each main pulmonary artery with extension into multiple upper and lower lobe branches. 2.  No thoracic aortic aneurysm or dissection. 3. Prominence of the main pulmonary outflow tract, a finding felt to be indicative of a degree of pulmonary arterial hypertension. 4. Moderate pleural effusion on the right with right base atelectasis. Areas of scarring and volume loss the right. Left base atelectasis. No edema or consolidation on the left. Subcentimeter nodular opacities remain as noted above, stable. 5.  No demonstrable thoracic adenopathy. 6. Reflux of contrast into the inferior vena cava and hepatic veins, a finding felt to be indicative of  increase in right heart pressure. 7.  Diffuse idiopathic skeletal hyperostosis in the thoracic spine. Critical Value/emergent results were called by telephone at the time of interpretation on 02/06/2018 at 1:25 pm to Dr. Julianne Rice , who verbally acknowledged these results. Electronically Signed   By: Lowella Grip III M.D.   On: 02/06/2018 13:26   Ir Angiogram Pulmonary Bilateral Selective  Result Date: 02/08/2018 INDICATION: Sub massive pulmonary thromboembolism EXAM: PULMONARY ARTERY LYSIS COMPARISON:  None MEDICATIONS: None. ANESTHESIA/SEDATION: Versed  2 mg IV; Fentanyl 100 mcg IV Moderate Sedation Time:  58 minutes The patient was continuously monitored during the procedure by the interventional radiology nurse under my direct supervision. FLUOROSCOPY TIME:  Fluoroscopy Time: 10 minutes 18 seconds (60 mGy). COMPLICATIONS: None immediate. TECHNIQUE: Informed written consent was obtained from the patient after a thorough discussion of the procedural risks, benefits and alternatives. All questions were addressed. Maximal Sterile Barrier Technique was utilized including caps, mask, sterile gowns, sterile gloves, sterile drape, hand hygiene and skin antiseptic. A timeout was performed prior to the initiation of the procedure. The right groin was prepped and draped in a sterile fashion. 1% lidocaine was utilized for local anesthesia. Under sonographic guidance, a micropuncture needle was inserted into the right common femoral vein and removed over a 018 wire. Wire was up sized to a Bentson. A 7 French sheath was inserted. An additional 7 French sheath was placed in a similar fashion in the right common femoral vein. Sonographic documentation was obtained. The common femoral vein was noted to be patent by sonographic imaging. A vertebral catheter was advanced over the Bentson wire to the right atrium. It was then advanced over a glidewire into the main pulmonary artery. Pulmonary artery pressure was  obtained at 47/22 with a mean of 32 mm Hg. The catheter was advanced over the glidewire to the distal right pulmonary artery. Pulmonary arteriography was performed. The vertebral catheter was exchanged over a rose in wire for a 12 cm EKOS pulmonary artery infusion catheter. The vertebral catheter was then advanced over a Bentson wire through the second sheath to the right atrium. It was then advanced over a glidewire in the distal left pulmonary artery. Contrast was injected for pulmonary arteriography. It was then exchanged over a rose in wire for a 12 cm EKOS infusion catheter. The sheaths and catheters were sewn in place. Lysis was instituted with tPA at 1 milligram/hour. FINDINGS: Bilateral pulmonary arteriography confirms proper position of the right and left pulmonary artery catheter is within the pulmonary arterial tree. Final imaging demonstrates appropriate position of the bilateral infusion catheters within the right and left pulmonary arteries. IMPRESSION: Successful institution of EKOS pulmonary artery lysis for submassive pulmonary thromboembolism. Electronically Signed   By: Marybelle Killings M.D.   On: 02/08/2018 08:41   Ir Angiogram Selective Each Additional Vessel  Result Date: 02/08/2018 INDICATION: Sub massive pulmonary thromboembolism EXAM: PULMONARY ARTERY LYSIS COMPARISON:  None MEDICATIONS: None. ANESTHESIA/SEDATION: Versed 2 mg IV; Fentanyl 100 mcg IV Moderate Sedation Time:  58 minutes The patient was continuously monitored during the procedure by the interventional radiology nurse under my direct supervision. FLUOROSCOPY TIME:  Fluoroscopy Time: 10 minutes 18 seconds (60 mGy). COMPLICATIONS: None immediate. TECHNIQUE: Informed written consent was obtained from the patient after a thorough discussion of the procedural risks, benefits and alternatives. All questions were addressed. Maximal Sterile Barrier Technique was utilized including caps, mask, sterile gowns, sterile gloves, sterile  drape, hand hygiene and skin antiseptic. A timeout was performed prior to the initiation of the procedure. The right groin was prepped and draped in a sterile fashion. 1% lidocaine was utilized for local anesthesia. Under sonographic guidance, a micropuncture needle was inserted into the right common femoral vein and removed over a 018 wire. Wire was up sized to a Bentson. A 7 French sheath was inserted. An additional 7 French sheath was placed in a similar fashion in the right common femoral vein. Sonographic documentation was obtained. The common femoral vein was noted to be  patent by sonographic imaging. A vertebral catheter was advanced over the Bentson wire to the right atrium. It was then advanced over a glidewire into the main pulmonary artery. Pulmonary artery pressure was obtained at 47/22 with a mean of 32 mm Hg. The catheter was advanced over the glidewire to the distal right pulmonary artery. Pulmonary arteriography was performed. The vertebral catheter was exchanged over a rose in wire for a 12 cm EKOS pulmonary artery infusion catheter. The vertebral catheter was then advanced over a Bentson wire through the second sheath to the right atrium. It was then advanced over a glidewire in the distal left pulmonary artery. Contrast was injected for pulmonary arteriography. It was then exchanged over a rose in wire for a 12 cm EKOS infusion catheter. The sheaths and catheters were sewn in place. Lysis was instituted with tPA at 1 milligram/hour. FINDINGS: Bilateral pulmonary arteriography confirms proper position of the right and left pulmonary artery catheter is within the pulmonary arterial tree. Final imaging demonstrates appropriate position of the bilateral infusion catheters within the right and left pulmonary arteries. IMPRESSION: Successful institution of EKOS pulmonary artery lysis for submassive pulmonary thromboembolism. Electronically Signed   By: Marybelle Killings M.D.   On: 02/08/2018 08:41   Ir  Angiogram Selective Each Additional Vessel  Result Date: 02/08/2018 INDICATION: Sub massive pulmonary thromboembolism EXAM: PULMONARY ARTERY LYSIS COMPARISON:  None MEDICATIONS: None. ANESTHESIA/SEDATION: Versed 2 mg IV; Fentanyl 100 mcg IV Moderate Sedation Time:  58 minutes The patient was continuously monitored during the procedure by the interventional radiology nurse under my direct supervision. FLUOROSCOPY TIME:  Fluoroscopy Time: 10 minutes 18 seconds (60 mGy). COMPLICATIONS: None immediate. TECHNIQUE: Informed written consent was obtained from the patient after a thorough discussion of the procedural risks, benefits and alternatives. All questions were addressed. Maximal Sterile Barrier Technique was utilized including caps, mask, sterile gowns, sterile gloves, sterile drape, hand hygiene and skin antiseptic. A timeout was performed prior to the initiation of the procedure. The right groin was prepped and draped in a sterile fashion. 1% lidocaine was utilized for local anesthesia. Under sonographic guidance, a micropuncture needle was inserted into the right common femoral vein and removed over a 018 wire. Wire was up sized to a Bentson. A 7 French sheath was inserted. An additional 7 French sheath was placed in a similar fashion in the right common femoral vein. Sonographic documentation was obtained. The common femoral vein was noted to be patent by sonographic imaging. A vertebral catheter was advanced over the Bentson wire to the right atrium. It was then advanced over a glidewire into the main pulmonary artery. Pulmonary artery pressure was obtained at 47/22 with a mean of 32 mm Hg. The catheter was advanced over the glidewire to the distal right pulmonary artery. Pulmonary arteriography was performed. The vertebral catheter was exchanged over a rose in wire for a 12 cm EKOS pulmonary artery infusion catheter. The vertebral catheter was then advanced over a Bentson wire through the second sheath to  the right atrium. It was then advanced over a glidewire in the distal left pulmonary artery. Contrast was injected for pulmonary arteriography. It was then exchanged over a rose in wire for a 12 cm EKOS infusion catheter. The sheaths and catheters were sewn in place. Lysis was instituted with tPA at 1 milligram/hour. FINDINGS: Bilateral pulmonary arteriography confirms proper position of the right and left pulmonary artery catheter is within the pulmonary arterial tree. Final imaging demonstrates appropriate position of the bilateral infusion catheters  within the right and left pulmonary arteries. IMPRESSION: Successful institution of EKOS pulmonary artery lysis for submassive pulmonary thromboembolism. Electronically Signed   By: Marybelle Killings M.D.   On: 02/08/2018 08:41   Ct Tibia Fibula Right W Contrast  Result Date: 02/11/2018 CLINICAL DATA:  Right lower leg pain. Chest CT 02/06/2018 positive for pulmonary embolus. Possible cyst in the right lower leg on Doppler ultrasound. EXAM: CT OF THE LOWER RIGHT EXTREMITY WITH CONTRAST TECHNIQUE: Multidetector CT imaging of the lower right extremity was performed according to the standard protocol following intravenous contrast administration. COMPARISON:  None. CONTRAST:  125 mL OMNIPAQUE IOHEXOL 350 MG/ML SOLN FINDINGS: Bones/Joint/Cartilage No acute or focal abnormality is identified. Mild degenerative change is present about the knee. Ligaments Suboptimally assessed by CT. Muscles and Tendons Intact. An ill-defined low-attenuation area in the lateral soleus measuring 3.1 cm craniocaudal by 1.7 cm AP x 2.1 cm transverse is identified. Soft tissues Subcutaneous edema in the lower leg is more intense distally and laterally. Vascular structures are unremarkable in appearance. IMPRESSION: Ill-defined low-attenuation focus in the right soleus is not suggestive of a hematoma and could be due to a ganglion cyst, focal inflammatory change or myxoma. Subcutaneous edema about  the lower leg is most intense distally and laterally. Electronically Signed   By: Inge Rise M.D.   On: 02/11/2018 18:44   Mr Tibia Fibula Right W Wo Contrast  Result Date: 02/13/2018 CLINICAL DATA:  Severe right lower leg pain and swelling with a cystic lesion seen in the left soleus muscle on prior CT. EXAM: MRI OF LOWER RIGHT EXTREMITY WITHOUT AND WITH CONTRAST TECHNIQUE: Multiplanar, multisequence MR imaging of the right lower extremity was performed both before and after administration of intravenous contrast. CONTRAST:  10 cc Gadavist IV. COMPARISON:  CT right lower leg 02/11/2018. FINDINGS: Bones/Joint/Cartilage Marrow signal is normal throughout without fracture, stress change or focal lesion. Ligaments Negative. Muscles and Tendons There is a collection in the right soleus muscle measuring 3.2 cm AP x 2.6 cm transverse x 6.9 cm craniocaudal. The collection is T2 hyperintense with T1 hyperintensity layering dependently consistent with a hematoma. It does not enhance. There is edema and mild enhancement in the adjacent soleus muscle. The examination is otherwise negative. Soft tissues Negative. IMPRESSION: A cystic lesion the right soleus muscle seen on prior examination is a hematoma. No evidence of neoplastic process is identified. Mild edema and enhancement in the adjacent soleus muscle are noted. Electronically Signed   By: Inge Rise M.D.   On: 02/13/2018 12:47   Ir US Guide Vasc Access Right  Result Date: 02/08/2018 INDICATION: Sub massive pulmonary thromboembolism EXAM: PULMONARY ARTERY LYSIS COMPARISON:  None MEDICATIONS: None. ANESTHESIA/SEDATION: Versed 2 mg IV; Fentanyl 100 mcg IV Moderate Sedation Time:  58 minutes The patient was continuously monitored during the procedure by the interventional radiology nurse under my direct supervision. FLUOROSCOPY TIME:  Fluoroscopy Time: 10 minutes 18 seconds (60 mGy). COMPLICATIONS: None immediate. TECHNIQUE: Informed written consent was  obtained from the patient after a thorough discussion of the procedural risks, benefits and alternatives. All questions were addressed. Maximal Sterile Barrier Technique was utilized including caps, mask, sterile gowns, sterile gloves, sterile drape, hand hygiene and skin antiseptic. A timeout was performed prior to the initiation of the procedure. The right groin was prepped and draped in a sterile fashion. 1% lidocaine was utilized for local anesthesia. Under sonographic guidance, a micropuncture needle was inserted into the right common femoral vein and removed over a 018  wire. Wire was up sized to a Bentson. A 7 French sheath was inserted. An additional 7 French sheath was placed in a similar fashion in the right common femoral vein. Sonographic documentation was obtained. The common femoral vein was noted to be patent by sonographic imaging. A vertebral catheter was advanced over the Bentson wire to the right atrium. It was then advanced over a glidewire into the main pulmonary artery. Pulmonary artery pressure was obtained at 47/22 with a mean of 32 mm Hg. The catheter was advanced over the glidewire to the distal right pulmonary artery. Pulmonary arteriography was performed. The vertebral catheter was exchanged over a rose in wire for a 12 cm EKOS pulmonary artery infusion catheter. The vertebral catheter was then advanced over a Bentson wire through the second sheath to the right atrium. It was then advanced over a glidewire in the distal left pulmonary artery. Contrast was injected for pulmonary arteriography. It was then exchanged over a rose in wire for a 12 cm EKOS infusion catheter. The sheaths and catheters were sewn in place. Lysis was instituted with tPA at 1 milligram/hour. FINDINGS: Bilateral pulmonary arteriography confirms proper position of the right and left pulmonary artery catheter is within the pulmonary arterial tree. Final imaging demonstrates appropriate position of the bilateral infusion  catheters within the right and left pulmonary arteries. IMPRESSION: Successful institution of EKOS pulmonary artery lysis for submassive pulmonary thromboembolism. Electronically Signed   By: Marybelle Killings M.D.   On: 02/08/2018 08:41   Dg Chest Port 1 View  Result Date: 02/07/2018 CLINICAL DATA:  Pulmonary edema EXAM: PORTABLE CHEST 1 VIEW COMPARISON:  02/06/2018 FINDINGS: Prior median sternotomy. Infusion catheters are noted in the pulmonary arteries bilaterally. Mild elevation of the right hemidiaphragm. Small right pleural effusion with right base atelectasis. Left lung clear. IMPRESSION: Small right pleural effusion with right base atelectasis. Electronically Signed   By: Rolm Baptise M.D.   On: 02/07/2018 07:09   Dg Chest Port 1 View  Result Date: 02/06/2018 CLINICAL DATA:  Shortness of breath EXAM: PORTABLE CHEST 1 VIEW COMPARISON:  CT chest 01/03/2018 FINDINGS: Chronic partially loculated right pleural effusion. No focal consolidation. No pneumothorax. No left pleural effusion. Stable cardiomediastinal silhouette. Surgical clips in the right hilum. Prior median sternotomy. The osseous structures are unremarkable. IMPRESSION: 1. No acute cardiopulmonary disease. 2. Stable small loculated right pleural effusion. Electronically Signed   By: Kathreen Devoid   On: 02/06/2018 11:21   Dg Abd Portable 1v  Result Date: 02/10/2018 CLINICAL DATA:  Abdominal pain and constipation EXAM: PORTABLE ABDOMEN - 1 VIEW COMPARISON:  CT abdomen and pelvis 01/03/2018 FINDINGS: Normal bowel gas pattern. No bowel dilatation or bowel wall thickening. Bones demineralized. LEFT hip prosthesis. No urinary tract calcification. IMPRESSION: Normal bowel gas pattern Electronically Signed   By: Lavonia Dana M.D.   On: 02/10/2018 08:56   Ir Infusion Thrombol Arterial Initial (ms)  Result Date: 02/08/2018 INDICATION: Sub massive pulmonary thromboembolism EXAM: PULMONARY ARTERY LYSIS COMPARISON:  None MEDICATIONS: None.  ANESTHESIA/SEDATION: Versed 2 mg IV; Fentanyl 100 mcg IV Moderate Sedation Time:  58 minutes The patient was continuously monitored during the procedure by the interventional radiology nurse under my direct supervision. FLUOROSCOPY TIME:  Fluoroscopy Time: 10 minutes 18 seconds (60 mGy). COMPLICATIONS: None immediate. TECHNIQUE: Informed written consent was obtained from the patient after a thorough discussion of the procedural risks, benefits and alternatives. All questions were addressed. Maximal Sterile Barrier Technique was utilized including caps, mask, sterile gowns, sterile gloves, sterile  drape, hand hygiene and skin antiseptic. A timeout was performed prior to the initiation of the procedure. The right groin was prepped and draped in a sterile fashion. 1% lidocaine was utilized for local anesthesia. Under sonographic guidance, a micropuncture needle was inserted into the right common femoral vein and removed over a 018 wire. Wire was up sized to a Bentson. A 7 French sheath was inserted. An additional 7 French sheath was placed in a similar fashion in the right common femoral vein. Sonographic documentation was obtained. The common femoral vein was noted to be patent by sonographic imaging. A vertebral catheter was advanced over the Bentson wire to the right atrium. It was then advanced over a glidewire into the main pulmonary artery. Pulmonary artery pressure was obtained at 47/22 with a mean of 32 mm Hg. The catheter was advanced over the glidewire to the distal right pulmonary artery. Pulmonary arteriography was performed. The vertebral catheter was exchanged over a rose in wire for a 12 cm EKOS pulmonary artery infusion catheter. The vertebral catheter was then advanced over a Bentson wire through the second sheath to the right atrium. It was then advanced over a glidewire in the distal left pulmonary artery. Contrast was injected for pulmonary arteriography. It was then exchanged over a rose in wire  for a 12 cm EKOS infusion catheter. The sheaths and catheters were sewn in place. Lysis was instituted with tPA at 1 milligram/hour. FINDINGS: Bilateral pulmonary arteriography confirms proper position of the right and left pulmonary artery catheter is within the pulmonary arterial tree. Final imaging demonstrates appropriate position of the bilateral infusion catheters within the right and left pulmonary arteries. IMPRESSION: Successful institution of EKOS pulmonary artery lysis for submassive pulmonary thromboembolism. Electronically Signed   By: Marybelle Killings M.D.   On: 02/08/2018 08:41   Ir Infusion Thrombol Arterial Initial (ms)  Result Date: 02/08/2018 INDICATION: Sub massive pulmonary thromboembolism EXAM: PULMONARY ARTERY LYSIS COMPARISON:  None MEDICATIONS: None. ANESTHESIA/SEDATION: Versed 2 mg IV; Fentanyl 100 mcg IV Moderate Sedation Time:  58 minutes The patient was continuously monitored during the procedure by the interventional radiology nurse under my direct supervision. FLUOROSCOPY TIME:  Fluoroscopy Time: 10 minutes 18 seconds (60 mGy). COMPLICATIONS: None immediate. TECHNIQUE: Informed written consent was obtained from the patient after a thorough discussion of the procedural risks, benefits and alternatives. All questions were addressed. Maximal Sterile Barrier Technique was utilized including caps, mask, sterile gowns, sterile gloves, sterile drape, hand hygiene and skin antiseptic. A timeout was performed prior to the initiation of the procedure. The right groin was prepped and draped in a sterile fashion. 1% lidocaine was utilized for local anesthesia. Under sonographic guidance, a micropuncture needle was inserted into the right common femoral vein and removed over a 018 wire. Wire was up sized to a Bentson. A 7 French sheath was inserted. An additional 7 French sheath was placed in a similar fashion in the right common femoral vein. Sonographic documentation was obtained. The common  femoral vein was noted to be patent by sonographic imaging. A vertebral catheter was advanced over the Bentson wire to the right atrium. It was then advanced over a glidewire into the main pulmonary artery. Pulmonary artery pressure was obtained at 47/22 with a mean of 32 mm Hg. The catheter was advanced over the glidewire to the distal right pulmonary artery. Pulmonary arteriography was performed. The vertebral catheter was exchanged over a rose in wire for a 12 cm EKOS pulmonary artery infusion catheter. The vertebral catheter  was then advanced over a Bentson wire through the second sheath to the right atrium. It was then advanced over a glidewire in the distal left pulmonary artery. Contrast was injected for pulmonary arteriography. It was then exchanged over a rose in wire for a 12 cm EKOS infusion catheter. The sheaths and catheters were sewn in place. Lysis was instituted with tPA at 1 milligram/hour. FINDINGS: Bilateral pulmonary arteriography confirms proper position of the right and left pulmonary artery catheter is within the pulmonary arterial tree. Final imaging demonstrates appropriate position of the bilateral infusion catheters within the right and left pulmonary arteries. IMPRESSION: Successful institution of EKOS pulmonary artery lysis for submassive pulmonary thromboembolism. Electronically Signed   By: Marybelle Killings M.D.   On: 02/08/2018 08:41   Ir Jacolyn Reedy F/u Elizabeth Sauer Art/ven Final Day (ms)  Result Date: 02/07/2018 INDICATION: 67 year old male with a history of symptomatic pulmonary embolism, status post catheter directed local thrombolysis EXAM: PE LYSIS FOLLOW-UP COMPARISON:  CT 02/06/2018, 02/06/2018 MEDICATIONS: None. ANESTHESIA/SEDATION: None FLUOROSCOPY TIME:  None COMPLICATIONS: None TECHNIQUE: Informed written consent was obtained from the patient after a thorough discussion of the procedural risks, benefits and alternatives. All questions were addressed. Maximal Sterile Barrier Technique  was utilized including caps, mask, sterile gowns, sterile gloves, sterile drape, hand hygiene and skin antiseptic. A timeout was performed prior to the initiation of the procedure. Transduction of the left and right pulmonary arterial lysis catheter performed at the bedside. Once the pressures were measured, all catheters were removed. Venous sheath removed from the right common femoral vein approach. Sterile bandages were placed. Patient tolerated the procedure well and remained hemodynamically stable throughout. No complications were encountered and no significant blood loss. FINDINGS: Left PA pressure: 28 Right PA pressure: 25 Measurements performed 11 a.m. 02/07/2018 IMPRESSION: Status post bedside transduction of PA lytic catheters for repeat pressure measurement, as above. Signed, Dulcy Fanny. Dellia Nims, RPVI [facility] Electronically Signed   By: Corrie Mckusick D.O.   On: 02/07/2018 15:56    ASSESSMENT AND PLAN: This is a very pleasant 67 years old white male with a stage Ia non-small cell lung cancer status post right upper lobectomy with lymph node dissection on September 02, 2016. The patient has been in observation since that time. Unfortunately there are some concerning findings with nodular density in the posterior right lower lobe as well as increase and right pleural effusion suspicious for disease recurrence. He recently underwent ultrasound-guided right thoracentesis and the cytology of the pleural fluid was consistent with recurrent adenocarcinoma. 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 The patient was started on treatment with Tagrisso 80 mg p.o. daily on 11/05/2017.  He status post 4 months of treatment. The patient continues to tolerate his treatment fairly well with no concerning adverse effects. For the recent acute pulmonary embolism, he was started on treatment with  Xarelto and he is tolerating it well. His most recent imaging studies during his hospitalization showed no concerning findings for disease progression. I recommended for the patient to continue his current treatment with Tagrisso. I will see him back for follow-up visit in 1 months for evaluation with repeat blood work. The patient will receive flu vaccine today. He was advised to call immediately if he has any concerning symptoms in the interval. The patient voices understanding of current disease status and treatment options and is in agreement with the current care plan. I spent 10 minutes  counseling the patient face to face. The total time spent in the appointment was 15 minutes.  All questions were answered. The patient knows to call the clinic with any problems, questions or concerns. We can certainly see the patient much sooner if necessary.  Disclaimer: This note was dictated with voice recognition software. Similar sounding words can inadvertently be transcribed and may not be corrected upon review.

## 2018-02-28 NOTE — Telephone Encounter (Signed)
Appts scheduled AVS/Calendar printed per 10/21 los

## 2018-03-10 ENCOUNTER — Other Ambulatory Visit: Payer: Self-pay | Admitting: Internal Medicine

## 2018-03-10 DIAGNOSIS — E785 Hyperlipidemia, unspecified: Secondary | ICD-10-CM

## 2018-03-17 ENCOUNTER — Ambulatory Visit: Payer: Medicare Other | Admitting: Internal Medicine

## 2018-03-17 ENCOUNTER — Other Ambulatory Visit (INDEPENDENT_AMBULATORY_CARE_PROVIDER_SITE_OTHER): Payer: Medicare Other

## 2018-03-17 ENCOUNTER — Encounter: Payer: Self-pay | Admitting: Internal Medicine

## 2018-03-17 VITALS — BP 130/80 | HR 71 | Temp 97.9°F | Ht 69.0 in | Wt 218.0 lb

## 2018-03-17 DIAGNOSIS — E118 Type 2 diabetes mellitus with unspecified complications: Secondary | ICD-10-CM

## 2018-03-17 DIAGNOSIS — I739 Peripheral vascular disease, unspecified: Secondary | ICD-10-CM | POA: Diagnosis not present

## 2018-03-17 DIAGNOSIS — D500 Iron deficiency anemia secondary to blood loss (chronic): Secondary | ICD-10-CM | POA: Diagnosis not present

## 2018-03-17 DIAGNOSIS — M791 Myalgia, unspecified site: Secondary | ICD-10-CM

## 2018-03-17 LAB — CBC WITH DIFFERENTIAL/PLATELET
Basophils Absolute: 0 10*3/uL (ref 0.0–0.1)
Basophils Relative: 0.8 % (ref 0.0–3.0)
Eosinophils Absolute: 0.1 10*3/uL (ref 0.0–0.7)
Eosinophils Relative: 2.5 % (ref 0.0–5.0)
HCT: 42 % (ref 39.0–52.0)
Hemoglobin: 13.5 g/dL (ref 13.0–17.0)
Lymphocytes Relative: 16.9 % (ref 12.0–46.0)
Lymphs Abs: 0.9 10*3/uL (ref 0.7–4.0)
MCHC: 32.1 g/dL (ref 30.0–36.0)
MCV: 89.7 fl (ref 78.0–100.0)
Monocytes Absolute: 0.6 10*3/uL (ref 0.1–1.0)
Monocytes Relative: 11 % (ref 3.0–12.0)
Neutro Abs: 3.6 10*3/uL (ref 1.4–7.7)
Neutrophils Relative %: 68.8 % (ref 43.0–77.0)
Platelets: 182 10*3/uL (ref 150.0–400.0)
RBC: 4.68 Mil/uL (ref 4.22–5.81)
RDW: 17.6 % — ABNORMAL HIGH (ref 11.5–15.5)
WBC: 5.2 10*3/uL (ref 4.0–10.5)

## 2018-03-17 LAB — IBC PANEL
Iron: 124 ug/dL (ref 42–165)
Saturation Ratios: 35.1 % (ref 20.0–50.0)
Transferrin: 252 mg/dL (ref 212.0–360.0)

## 2018-03-17 LAB — COMPREHENSIVE METABOLIC PANEL
ALT: 43 U/L (ref 0–53)
AST: 29 U/L (ref 0–37)
Albumin: 4.3 g/dL (ref 3.5–5.2)
Alkaline Phosphatase: 55 U/L (ref 39–117)
BUN: 22 mg/dL (ref 6–23)
CO2: 26 mEq/L (ref 19–32)
Calcium: 9.7 mg/dL (ref 8.4–10.5)
Chloride: 105 mEq/L (ref 96–112)
Creatinine, Ser: 1.42 mg/dL (ref 0.40–1.50)
GFR: 52.56 mL/min — ABNORMAL LOW (ref >60.00)
Glucose, Bld: 106 mg/dL — ABNORMAL HIGH (ref 70–99)
Potassium: 4.7 mEq/L (ref 3.5–5.1)
Sodium: 138 mEq/L (ref 135–145)
Total Bilirubin: 0.5 mg/dL (ref 0.2–1.2)
Total Protein: 7.2 g/dL (ref 6.0–8.3)

## 2018-03-17 LAB — MICROALBUMIN / CREATININE URINE RATIO
Creatinine,U: 51.4 mg/dL
Microalb Creat Ratio: 1.4 mg/g (ref 0.0–30.0)
Microalb, Ur: 0.7 mg/dL (ref 0.0–1.9)

## 2018-03-17 LAB — SEDIMENTATION RATE: Sed Rate: 21 mm/hr — ABNORMAL HIGH (ref 0–20)

## 2018-03-17 LAB — FERRITIN: Ferritin: 137 ng/mL (ref 22.0–322.0)

## 2018-03-17 LAB — CK: Total CK: 237 U/L — ABNORMAL HIGH (ref 7–232)

## 2018-03-17 NOTE — Progress Notes (Signed)
Subjective:  Patient ID: James [name], male    DOB: 1950-09-06  Age: 67 y.o. MRN: [id number]  CC: Anemia   HPI MELVILLE ENGEN presents for f/up - Since I last saw him he has been experiencing muscle cramps.  He locates them to the muscles in his scalp and his lower extremities.  Some of the discomfort sounds like it is related to claudication and some of the discomfort occurs while he is sleeping.  His other chronic symptoms are improving.  He is taking the iron supplement and experiences less shortness of breath and fatigue.  His exercise tolerance is improving.  He has completed the antibiotic therapy for prostatitis since tells me that his urinary symptoms have resolved.  Outpatient Medications Prior to Visit  Medication Sig Dispense Refill  . furosemide (LASIX) 40 MG tablet Take 1 tablet (40 mg total) by mouth as needed for fluid.    . metoprolol tartrate (LOPRESSOR) 25 MG tablet TAKE 1/2 TABLET BY MOUTH 2 TIMES DAILY 90 tablet 3  . Omega-3 Fatty Acids (FISH OIL) 500 MG CAPS Take 500 mg by mouth daily.     . potassium chloride SA (K-DUR,KLOR-CON) 20 MEQ tablet Take 1 tablet (20 mEq total) by mouth daily as needed. With lasix 90 tablet 0  . rivaroxaban (XARELTO) 20 MG TABS tablet Take 1 tablet (20 mg total) by mouth daily with supper. 90 tablet 3  . TAGRISSO 80 MG tablet TAKE 1 TABLET (80 MG TOTAL) BY MOUTH DAILY. (Patient taking differently: Take 80 mg by mouth daily. ) 30 tablet 2  . traMADol (ULTRAM) 50 MG tablet Take 1 tablet (50 mg total) by mouth every 6 (six) hours as needed (may take one or two tablets every six hrs prn). 30 tablet 0  . acetaminophen (TYLENOL) 325 MG tablet Take 650 mg by mouth every 6 (six) hours as needed for moderate pain or headache.    Marland Kitchen atorvastatin (LIPITOR) 20 MG tablet TAKE 1 TABLET (20 MG TOTAL) BY MOUTH DAILY. 90 tablet 1  . ferrous sulfate 325 (65 FE) MG tablet Take 1 tablet (325 mg total) by mouth 2 (two) times daily with a meal. 180 tablet 1  .  Rivaroxaban 15 & 20 MG TBPK Take as directed on package: Start with one 15mg  tablet by mouth twice a day with food. On Day 22, switch to one 20mg  tablet once a day with food. PHARMACY: Please discard the first 2 tablets from the starter pack which he received in the hospital. 51 each 0  . sodium chloride (OCEAN) 0.65 % SOLN nasal spray Place 1 spray into both nostrils as needed for congestion.    . sulfamethoxazole-trimethoprim (BACTRIM DS,SEPTRA DS) 800-160 MG tablet Take 1 tablet by mouth 2 (two) times daily. 60 tablet 0   No facility-administered medications prior to visit.     ROS Review of Systems  Constitutional: Positive for fatigue. Negative for appetite change, chills and fever.  HENT: Negative.   Eyes: Negative for visual disturbance.  Respiratory: Positive for shortness of breath.   Gastrointestinal: Negative for abdominal pain, constipation, diarrhea, nausea and vomiting.  Genitourinary: Negative.  Negative for difficulty urinating, dysuria, frequency, hematuria, testicular pain and urgency.  Musculoskeletal: Positive for myalgias. Negative for arthralgias.  Skin: Negative.   Neurological: Positive for weakness. Negative for dizziness, light-headedness and numbness.  Hematological: Negative.  Negative for adenopathy. Does not bruise/bleed easily.  Psychiatric/Behavioral: Negative.     Objective:  BP 130/80 (BP Location: Left Arm, Patient  Position: Sitting, Cuff Size: Large)   Pulse 71   Temp 97.9 F (36.6 C) (Oral)   Ht 5\' 9"  (1.753 m)   Wt 218 lb (98.9 kg)   SpO2 96%   BMI 32.19 kg/m   BP Readings from Last 3 Encounters:  03/17/18 130/80  02/28/18 130/73  02/25/18 118/72    Wt Readings from Last 3 Encounters:  03/17/18 218 lb (98.9 kg)  02/28/18 213 lb 8 oz (96.8 kg)  02/25/18 216 lb (98 kg)    Physical Exam  Constitutional: He is oriented to person, place, and time. No distress.  HENT:  Mouth/Throat: Oropharynx is clear and moist. No oropharyngeal  exudate.  Eyes: Conjunctivae are normal. No scleral icterus.  Neck: Normal range of motion. Neck supple. No JVD present. No thyromegaly present.  Cardiovascular: Normal rate and regular rhythm. Exam reveals no gallop.  Murmur heard.  Systolic murmur is present. Pulses:      Carotid pulses are 1+ on the right side, and 1+ on the left side.      Radial pulses are 1+ on the right side, and 1+ on the left side.       Femoral pulses are 1+ on the right side, and 1+ on the left side.      Popliteal pulses are 0 on the right side, and 0 on the left side.       Dorsalis pedis pulses are 0 on the right side, and 0 on the left side.       Posterior tibial pulses are 0 on the right side, and 0 on the left side.  1/6 SEM RUSB  Pulmonary/Chest: Effort normal and breath sounds normal. No respiratory distress. He has no wheezes. He has no rales.  Abdominal: Soft. Bowel sounds are normal. He exhibits no mass. There is no hepatosplenomegaly. There is no tenderness.  Musculoskeletal: Normal range of motion. He exhibits edema. He exhibits no tenderness or deformity.  1+ pitting edema in BLE  Lymphadenopathy:    He has no cervical adenopathy.  Neurological: He is alert and oriented to person, place, and time.  Skin: Skin is warm and dry. No rash noted. He is not diaphoretic.  Vitals reviewed.   Lab Results  Component Value Date   WBC 5.2 03/17/2018   HGB 13.5 03/17/2018   HCT 42.0 03/17/2018   PLT 182.0 03/17/2018   GLUCOSE 106 (H) 03/17/2018   CHOL 177 05/27/2017   TRIG 128 05/27/2017   HDL 41 05/27/2017   LDLDIRECT 177.0 07/24/2015   LDLCALC 110 (H) 05/27/2017   ALT 43 03/17/2018   AST 29 03/17/2018   NA 138 03/17/2018   K 4.7 03/17/2018   CL 105 03/17/2018   CREATININE 1.42 03/17/2018   BUN 22 03/17/2018   CO2 26 03/17/2018   TSH 2.56 07/28/2017   PSA 0.91 07/28/2017   INR 1.28 02/07/2018   HGBA1C 5.6 02/15/2018   MICROALBUR 0.7 03/17/2018    Ct Head Wo Contrast  Result Date:  02/06/2018 CLINICAL DATA:  Lung cancer.  Pre tPA. EXAM: CT HEAD WITHOUT CONTRAST TECHNIQUE: Contiguous axial images were obtained from the base of the skull through the vertex without intravenous contrast. COMPARISON:  10/26/2017 FINDINGS: Brain: No mass effect, midline shift, or acute hemorrhage. Vascular: No hyperdense vessel or unexpected calcification. Skull: Intact. Sinuses/Orbits: No acute finding. Other: Noncontributory IMPRESSION: No acute intracranial pathology. Electronically Signed   By: Marybelle Killings M.D.   On: 02/06/2018 17:03   Ct Angio Chest Pe  W And/or Wo Contrast  Result Date: 02/06/2018 CLINICAL DATA:  Shortness of breath.  History of lung carcinoma EXAM: CT ANGIOGRAPHY CHEST WITH CONTRAST TECHNIQUE: Multidetector CT imaging of the chest was performed using the standard protocol during bolus administration of intravenous contrast. Multiplanar CT image reconstructions and MIPs were obtained to evaluate the vascular anatomy. CONTRAST:  100 mL ISOVUE-370 IOPAMIDOL (ISOVUE-370) INJECTION 76% COMPARISON:  Chest CT January 03, 2018; chest radiograph February 06, 2018. FINDINGS: Cardiovascular: There is extensive pulmonary embolism arising from the main pulmonary arteries bilaterally and extending into multiple upper and lower lobe pulmonary artery branches. The right ventricle to left ventricle diameter ratio is 1.7, consistent with right heart strain. There is no thoracic aortic aneurysm or dissection. The visualized great vessels appear unremarkable. No pericardial effusion or pericardial thickening. The main pulmonary outflow tract measures 3.1 cm which is prominent. Mediastinum/Nodes: Thyroid appears unremarkable. There is no appreciable thoracic adenopathy. No esophageal lesions are evident. Lungs/Pleura: There is a moderate pleural effusion on the right with atelectatic change in the right base. There are areas of scarring on the right. There is a small granuloma in the right upper lobe. There  is a stable 4 mm nodular opacity in the posterior segment right upper lobe seen on axial slice 50 series 7. There is a stable 4 mm nodular opacity in the superior segment of the right lower lobe seen on axial slice 51 series 7. There is left base atelectasis. There is no edema or consolidation on the left. There is a stable 5 mm nodular opacity in the posterior segment left upper lobe seen on axial slice 26 series 7. No pleural effusion on the left evident. Upper Abdomen: There is reflux of contrast into the inferior vena cava and hepatic veins. There is a cyst arising in the posterior segment right lobe of the liver measuring 4.4 x 3.9 cm. Visualized upper abdominal structures otherwise appear unremarkable. Musculoskeletal: Patient is status post median sternotomy. There is degenerative change in the thoracic spine with diffuse idiopathic skeletal hyperostosis. There are no appreciable blastic or lytic bone lesions. No chest wall lesions are evident. Review of the MIP images confirms the above findings. IMPRESSION: 1. Positive for acute PE with CT evidence of right heart strain (RV/LV Ratio = 1.7) consistent with at least submassive (intermediate risk) PE. The presence of right heart strain has been associated with an increased risk of morbidity and mortality. Please activate Code PE by paging [phone]. Pulmonary emboli arise in each main pulmonary artery with extension into multiple upper and lower lobe branches. 2.  No thoracic aortic aneurysm or dissection. 3. Prominence of the main pulmonary outflow tract, a finding felt to be indicative of a degree of pulmonary arterial hypertension. 4. Moderate pleural effusion on the right with right base atelectasis. Areas of scarring and volume loss the right. Left base atelectasis. No edema or consolidation on the left. Subcentimeter nodular opacities remain as noted above, stable. 5.  No demonstrable thoracic adenopathy. 6. Reflux of contrast into the inferior vena  cava and hepatic veins, a finding felt to be indicative of increase in right heart pressure. 7.  Diffuse idiopathic skeletal hyperostosis in the thoracic spine. Critical Value/emergent results were called by telephone at the time of interpretation on 02/06/2018 at 1:25 pm to Dr. Julianne Rice , who verbally acknowledged these results. Electronically Signed   By: Lowella Grip III M.D.   On: 02/06/2018 13:26   Ir Angiogram Pulmonary Bilateral Selective  Result Date: 02/08/2018  INDICATION: Sub massive pulmonary thromboembolism EXAM: PULMONARY ARTERY LYSIS COMPARISON:  None MEDICATIONS: None. ANESTHESIA/SEDATION: Versed 2 mg IV; Fentanyl 100 mcg IV Moderate Sedation Time:  58 minutes The patient was continuously monitored during the procedure by the interventional radiology nurse under my direct supervision. FLUOROSCOPY TIME:  Fluoroscopy Time: 10 minutes 18 seconds (60 mGy). COMPLICATIONS: None immediate. TECHNIQUE: Informed written consent was obtained from the patient after a thorough discussion of the procedural risks, benefits and alternatives. All questions were addressed. Maximal Sterile Barrier Technique was utilized including caps, mask, sterile gowns, sterile gloves, sterile drape, hand hygiene and skin antiseptic. A timeout was performed prior to the initiation of the procedure. The right groin was prepped and draped in a sterile fashion. 1% lidocaine was utilized for local anesthesia. Under sonographic guidance, a micropuncture needle was inserted into the right common femoral vein and removed over a 018 wire. Wire was up sized to a Bentson. A 7 French sheath was inserted. An additional 7 French sheath was placed in a similar fashion in the right common femoral vein. Sonographic documentation was obtained. The common femoral vein was noted to be patent by sonographic imaging. A vertebral catheter was advanced over the Bentson wire to the right atrium. It was then advanced over a glidewire into the  main pulmonary artery. Pulmonary artery pressure was obtained at 47/22 with a mean of 32 mm Hg. The catheter was advanced over the glidewire to the distal right pulmonary artery. Pulmonary arteriography was performed. The vertebral catheter was exchanged over a rose in wire for a 12 cm EKOS pulmonary artery infusion catheter. The vertebral catheter was then advanced over a Bentson wire through the second sheath to the right atrium. It was then advanced over a glidewire in the distal left pulmonary artery. Contrast was injected for pulmonary arteriography. It was then exchanged over a rose in wire for a 12 cm EKOS infusion catheter. The sheaths and catheters were sewn in place. Lysis was instituted with tPA at 1 milligram/hour. FINDINGS: Bilateral pulmonary arteriography confirms proper position of the right and left pulmonary artery catheter is within the pulmonary arterial tree. Final imaging demonstrates appropriate position of the bilateral infusion catheters within the right and left pulmonary arteries. IMPRESSION: Successful institution of EKOS pulmonary artery lysis for submassive pulmonary thromboembolism. Electronically Signed   By: Marybelle Killings M.D.   On: 02/08/2018 08:41   Ir Angiogram Selective Each Additional Vessel  Result Date: 02/08/2018 INDICATION: Sub massive pulmonary thromboembolism EXAM: PULMONARY ARTERY LYSIS COMPARISON:  None MEDICATIONS: None. ANESTHESIA/SEDATION: Versed 2 mg IV; Fentanyl 100 mcg IV Moderate Sedation Time:  58 minutes The patient was continuously monitored during the procedure by the interventional radiology nurse under my direct supervision. FLUOROSCOPY TIME:  Fluoroscopy Time: 10 minutes 18 seconds (60 mGy). COMPLICATIONS: None immediate. TECHNIQUE: Informed written consent was obtained from the patient after a thorough discussion of the procedural risks, benefits and alternatives. All questions were addressed. Maximal Sterile Barrier Technique was utilized including  caps, mask, sterile gowns, sterile gloves, sterile drape, hand hygiene and skin antiseptic. A timeout was performed prior to the initiation of the procedure. The right groin was prepped and draped in a sterile fashion. 1% lidocaine was utilized for local anesthesia. Under sonographic guidance, a micropuncture needle was inserted into the right common femoral vein and removed over a 018 wire. Wire was up sized to a Bentson. A 7 French sheath was inserted. An additional 7 French sheath was placed in a similar fashion in the  right common femoral vein. Sonographic documentation was obtained. The common femoral vein was noted to be patent by sonographic imaging. A vertebral catheter was advanced over the Bentson wire to the right atrium. It was then advanced over a glidewire into the main pulmonary artery. Pulmonary artery pressure was obtained at 47/22 with a mean of 32 mm Hg. The catheter was advanced over the glidewire to the distal right pulmonary artery. Pulmonary arteriography was performed. The vertebral catheter was exchanged over a rose in wire for a 12 cm EKOS pulmonary artery infusion catheter. The vertebral catheter was then advanced over a Bentson wire through the second sheath to the right atrium. It was then advanced over a glidewire in the distal left pulmonary artery. Contrast was injected for pulmonary arteriography. It was then exchanged over a rose in wire for a 12 cm EKOS infusion catheter. The sheaths and catheters were sewn in place. Lysis was instituted with tPA at 1 milligram/hour. FINDINGS: Bilateral pulmonary arteriography confirms proper position of the right and left pulmonary artery catheter is within the pulmonary arterial tree. Final imaging demonstrates appropriate position of the bilateral infusion catheters within the right and left pulmonary arteries. IMPRESSION: Successful institution of EKOS pulmonary artery lysis for submassive pulmonary thromboembolism. Electronically Signed   By:  Marybelle Killings M.D.   On: 02/08/2018 08:41   Ir Angiogram Selective Each Additional Vessel  Result Date: 02/08/2018 INDICATION: Sub massive pulmonary thromboembolism EXAM: PULMONARY ARTERY LYSIS COMPARISON:  None MEDICATIONS: None. ANESTHESIA/SEDATION: Versed 2 mg IV; Fentanyl 100 mcg IV Moderate Sedation Time:  58 minutes The patient was continuously monitored during the procedure by the interventional radiology nurse under my direct supervision. FLUOROSCOPY TIME:  Fluoroscopy Time: 10 minutes 18 seconds (60 mGy). COMPLICATIONS: None immediate. TECHNIQUE: Informed written consent was obtained from the patient after a thorough discussion of the procedural risks, benefits and alternatives. All questions were addressed. Maximal Sterile Barrier Technique was utilized including caps, mask, sterile gowns, sterile gloves, sterile drape, hand hygiene and skin antiseptic. A timeout was performed prior to the initiation of the procedure. The right groin was prepped and draped in a sterile fashion. 1% lidocaine was utilized for local anesthesia. Under sonographic guidance, a micropuncture needle was inserted into the right common femoral vein and removed over a 018 wire. Wire was up sized to a Bentson. A 7 French sheath was inserted. An additional 7 French sheath was placed in a similar fashion in the right common femoral vein. Sonographic documentation was obtained. The common femoral vein was noted to be patent by sonographic imaging. A vertebral catheter was advanced over the Bentson wire to the right atrium. It was then advanced over a glidewire into the main pulmonary artery. Pulmonary artery pressure was obtained at 47/22 with a mean of 32 mm Hg. The catheter was advanced over the glidewire to the distal right pulmonary artery. Pulmonary arteriography was performed. The vertebral catheter was exchanged over a rose in wire for a 12 cm EKOS pulmonary artery infusion catheter. The vertebral catheter was then advanced  over a Bentson wire through the second sheath to the right atrium. It was then advanced over a glidewire in the distal left pulmonary artery. Contrast was injected for pulmonary arteriography. It was then exchanged over a rose in wire for a 12 cm EKOS infusion catheter. The sheaths and catheters were sewn in place. Lysis was instituted with tPA at 1 milligram/hour. FINDINGS: Bilateral pulmonary arteriography confirms proper position of the right and left pulmonary artery catheter  is within the pulmonary arterial tree. Final imaging demonstrates appropriate position of the bilateral infusion catheters within the right and left pulmonary arteries. IMPRESSION: Successful institution of EKOS pulmonary artery lysis for submassive pulmonary thromboembolism. Electronically Signed   By: Marybelle Killings M.D.   On: 02/08/2018 08:41   Ir US Guide Vasc Access Right  Result Date: 02/08/2018 INDICATION: Sub massive pulmonary thromboembolism EXAM: PULMONARY ARTERY LYSIS COMPARISON:  None MEDICATIONS: None. ANESTHESIA/SEDATION: Versed 2 mg IV; Fentanyl 100 mcg IV Moderate Sedation Time:  58 minutes The patient was continuously monitored during the procedure by the interventional radiology nurse under my direct supervision. FLUOROSCOPY TIME:  Fluoroscopy Time: 10 minutes 18 seconds (60 mGy). COMPLICATIONS: None immediate. TECHNIQUE: Informed written consent was obtained from the patient after a thorough discussion of the procedural risks, benefits and alternatives. All questions were addressed. Maximal Sterile Barrier Technique was utilized including caps, mask, sterile gowns, sterile gloves, sterile drape, hand hygiene and skin antiseptic. A timeout was performed prior to the initiation of the procedure. The right groin was prepped and draped in a sterile fashion. 1% lidocaine was utilized for local anesthesia. Under sonographic guidance, a micropuncture needle was inserted into the right common femoral vein and removed over a 018  wire. Wire was up sized to a Bentson. A 7 French sheath was inserted. An additional 7 French sheath was placed in a similar fashion in the right common femoral vein. Sonographic documentation was obtained. The common femoral vein was noted to be patent by sonographic imaging. A vertebral catheter was advanced over the Bentson wire to the right atrium. It was then advanced over a glidewire into the main pulmonary artery. Pulmonary artery pressure was obtained at 47/22 with a mean of 32 mm Hg. The catheter was advanced over the glidewire to the distal right pulmonary artery. Pulmonary arteriography was performed. The vertebral catheter was exchanged over a rose in wire for a 12 cm EKOS pulmonary artery infusion catheter. The vertebral catheter was then advanced over a Bentson wire through the second sheath to the right atrium. It was then advanced over a glidewire in the distal left pulmonary artery. Contrast was injected for pulmonary arteriography. It was then exchanged over a rose in wire for a 12 cm EKOS infusion catheter. The sheaths and catheters were sewn in place. Lysis was instituted with tPA at 1 milligram/hour. FINDINGS: Bilateral pulmonary arteriography confirms proper position of the right and left pulmonary artery catheter is within the pulmonary arterial tree. Final imaging demonstrates appropriate position of the bilateral infusion catheters within the right and left pulmonary arteries. IMPRESSION: Successful institution of EKOS pulmonary artery lysis for submassive pulmonary thromboembolism. Electronically Signed   By: Marybelle Killings M.D.   On: 02/08/2018 08:41   Dg Chest Port 1 View  Result Date: 02/07/2018 CLINICAL DATA:  Pulmonary edema EXAM: PORTABLE CHEST 1 VIEW COMPARISON:  02/06/2018 FINDINGS: Prior median sternotomy. Infusion catheters are noted in the pulmonary arteries bilaterally. Mild elevation of the right hemidiaphragm. Small right pleural effusion with right base atelectasis. Left  lung clear. IMPRESSION: Small right pleural effusion with right base atelectasis. Electronically Signed   By: Rolm Baptise M.D.   On: 02/07/2018 07:09   Dg Chest Port 1 View  Result Date: 02/06/2018 CLINICAL DATA:  Shortness of breath EXAM: PORTABLE CHEST 1 VIEW COMPARISON:  CT chest 01/03/2018 FINDINGS: Chronic partially loculated right pleural effusion. No focal consolidation. No pneumothorax. No left pleural effusion. Stable cardiomediastinal silhouette. Surgical clips in the right hilum. Prior  median sternotomy. The osseous structures are unremarkable. IMPRESSION: 1. No acute cardiopulmonary disease. 2. Stable small loculated right pleural effusion. Electronically Signed   By: Kathreen Devoid   On: 02/06/2018 11:21   Vas [place] Lower Extremity Venous (dvt)  Result Date: 02/07/2018  Lower Venous Study Indications: SOB, Swelling, and PE Hx of DVT.  Risk Factors: Cancer LUNG. Limitations: Bandages, body habitus and Catheterization on right groin area. Performing Technologist: Lorina Rabon  Examination Guidelines: A complete evaluation includes B-mode imaging, spectral Doppler, color Doppler, and power Doppler as needed of all accessible portions of each vessel. Bilateral testing is considered an integral part of a complete examination. Limited examinations for reoccurring indications may be performed as noted.  Right Venous Findings: +---------+---------------+---------+-----------+----------+-------------------+          CompressibilityPhasicitySpontaneityPropertiesSummary             +---------+---------------+---------+-----------+----------+-------------------+ CFV                                                   unable to obtain                                                          due to                                                                    catheterization on                                                        site                 +---------+---------------+---------+-----------+----------+-------------------+ SFJ                                                   unable to obtain                                                          due to                                                                    catheterization on  site                +---------+---------------+---------+-----------+----------+-------------------+ FV Prox  Full           Yes      Yes                                      +---------+---------------+---------+-----------+----------+-------------------+ FV Mid   Full                                                             +---------+---------------+---------+-----------+----------+-------------------+ FV DistalFull                                                             +---------+---------------+---------+-----------+----------+-------------------+ PFV      Full                                                             +---------+---------------+---------+-----------+----------+-------------------+ POP      Full           Yes      Yes                                      +---------+---------------+---------+-----------+----------+-------------------+ PTV      Full                                                             +---------+---------------+---------+-----------+----------+-------------------+ PERO     None                                         Chronic             +---------+---------------+---------+-----------+----------+-------------------+  Left Venous Findings: +---------+---------------+---------+-----------+----------------------+-------+          CompressibilityPhasicitySpontaneityProperties            Summary +---------+---------------+---------+-----------+----------------------+-------+ CFV      Full           Yes      Yes                                       +---------+---------------+---------+-----------+----------------------+-------+ SFJ      Partial  Chronic +---------+---------------+---------+-----------+----------------------+-------+ FV Prox  Partial                                                  Chronic +---------+---------------+---------+-----------+----------------------+-------+ FV Mid   Full                                                             +---------+---------------+---------+-----------+----------------------+-------+ FV DistalPartial                                                  Chronic +---------+---------------+---------+-----------+----------------------+-------+ PFV      Full                                                             +---------+---------------+---------+-----------+----------------------+-------+ POP      Partial        Yes      Yes        partially             Chronic                                             re-cannalized                 +---------+---------------+---------+-----------+----------------------+-------+ PTV      Partial                                                  Chronic +---------+---------------+---------+-----------+----------------------+-------+ PERO     None                                                     Chronic +---------+---------------+---------+-----------+----------------------+-------+ SSV      None                                                     Chronic +---------+---------------+---------+-----------+----------------------+-------+    Final Interpretation: Right: Findings consistent with chronic deep vein thrombosis involving the right peroneal vein. No cystic structure found in the popliteal fossa. Left: Findings consistent with chronic deep vein thrombosis involving the left femoral vein, left popliteal vein, left posterior tibial vein,  and left peroneal vein. 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 No cystic structure found in the popliteal fossa.  *  See table(s) above for measurements and observations. Electronically signed by Ruta Hinds MD on 02/07/2018 at 7:17:55 PM.    Final    Ir Infusion Thrombol Arterial Initial (ms)  Result Date: 02/08/2018 INDICATION: Sub massive pulmonary thromboembolism EXAM: PULMONARY ARTERY LYSIS COMPARISON:  None MEDICATIONS: None. ANESTHESIA/SEDATION: Versed 2 mg IV; Fentanyl 100 mcg IV Moderate Sedation Time:  58 minutes The patient was continuously monitored during the procedure by the interventional radiology nurse under my direct supervision. FLUOROSCOPY TIME:  Fluoroscopy Time: 10 minutes 18 seconds (60 mGy). COMPLICATIONS: None immediate. TECHNIQUE: Informed written consent was obtained from the patient after a thorough discussion of the procedural risks, benefits and alternatives. All questions were addressed. Maximal Sterile Barrier Technique was utilized including caps, mask, sterile gowns, sterile gloves, sterile drape, hand hygiene and skin antiseptic. A timeout was performed prior to the initiation of the procedure. The right groin was prepped and draped in a sterile fashion. 1% lidocaine was utilized for local anesthesia. Under sonographic guidance, a micropuncture needle was inserted into the right common femoral vein and removed over a 018 wire. Wire was up sized to a Bentson. A 7 French sheath was inserted. An additional 7 French sheath was placed in a similar fashion in the right common femoral vein. Sonographic documentation was obtained. The common femoral vein was noted to be patent by sonographic imaging. A vertebral catheter was advanced over the Bentson wire to the right atrium. It was then advanced over a glidewire into the main pulmonary artery. Pulmonary artery pressure was obtained at 47/22 with a mean of 32 mm Hg. The  catheter was advanced over the glidewire to the distal right pulmonary artery. Pulmonary arteriography was performed. The vertebral catheter was exchanged over a rose in wire for a 12 cm EKOS pulmonary artery infusion catheter. The vertebral catheter was then advanced over a Bentson wire through the second sheath to the right atrium. It was then advanced over a glidewire in the distal left pulmonary artery. Contrast was injected for pulmonary arteriography. It was then exchanged over a rose in wire for a 12 cm EKOS infusion catheter. The sheaths and catheters were sewn in place. Lysis was instituted with tPA at 1 milligram/hour. FINDINGS: Bilateral pulmonary arteriography confirms proper position of the right and left pulmonary artery catheter is within the pulmonary arterial tree. Final imaging demonstrates appropriate position of the bilateral infusion catheters within the right and left pulmonary arteries. IMPRESSION: Successful institution of EKOS pulmonary artery lysis for submassive pulmonary thromboembolism. Electronically Signed   By: Marybelle Killings M.D.   On: 02/08/2018 08:41   Ir Infusion Thrombol Arterial Initial (ms)  Result Date: 02/08/2018 INDICATION: Sub massive pulmonary thromboembolism EXAM: PULMONARY ARTERY LYSIS COMPARISON:  None MEDICATIONS: None. ANESTHESIA/SEDATION: Versed 2 mg IV; Fentanyl 100 mcg IV Moderate Sedation Time:  58 minutes The patient was continuously monitored during the procedure by the interventional radiology nurse under my direct supervision. FLUOROSCOPY TIME:  Fluoroscopy Time: 10 minutes 18 seconds (60 mGy). COMPLICATIONS: None immediate. TECHNIQUE: Informed written consent was obtained from the patient after a thorough discussion of the procedural risks, benefits and alternatives. All questions were addressed. Maximal Sterile Barrier Technique was utilized including caps, mask, sterile gowns, sterile gloves, sterile drape, hand hygiene and skin antiseptic. A timeout was  performed prior to the initiation of the procedure. The right groin was prepped and draped in a sterile fashion. 1% lidocaine was utilized for local anesthesia. Under sonographic guidance, a micropuncture needle was inserted into the right  common femoral vein and removed over a 018 wire. Wire was up sized to a Bentson. A 7 French sheath was inserted. An additional 7 French sheath was placed in a similar fashion in the right common femoral vein. Sonographic documentation was obtained. The common femoral vein was noted to be patent by sonographic imaging. A vertebral catheter was advanced over the Bentson wire to the right atrium. It was then advanced over a glidewire into the main pulmonary artery. Pulmonary artery pressure was obtained at 47/22 with a mean of 32 mm Hg. The catheter was advanced over the glidewire to the distal right pulmonary artery. Pulmonary arteriography was performed. The vertebral catheter was exchanged over a rose in wire for a 12 cm EKOS pulmonary artery infusion catheter. The vertebral catheter was then advanced over a Bentson wire through the second sheath to the right atrium. It was then advanced over a glidewire in the distal left pulmonary artery. Contrast was injected for pulmonary arteriography. It was then exchanged over a rose in wire for a 12 cm EKOS infusion catheter. The sheaths and catheters were sewn in place. Lysis was instituted with tPA at 1 milligram/hour. FINDINGS: Bilateral pulmonary arteriography confirms proper position of the right and left pulmonary artery catheter is within the pulmonary arterial tree. Final imaging demonstrates appropriate position of the bilateral infusion catheters within the right and left pulmonary arteries. IMPRESSION: Successful institution of EKOS pulmonary artery lysis for submassive pulmonary thromboembolism. Electronically Signed   By: Marybelle Killings M.D.   On: 02/08/2018 08:41   Ir Jacolyn Reedy F/u Elizabeth Sauer Art/ven Final Day (ms)  Result Date:  02/07/2018 INDICATION: 67 year old male with a history of symptomatic pulmonary embolism, status post catheter directed local thrombolysis EXAM: PE LYSIS FOLLOW-UP COMPARISON:  CT 02/06/2018, 02/06/2018 MEDICATIONS: None. ANESTHESIA/SEDATION: None FLUOROSCOPY TIME:  None COMPLICATIONS: None TECHNIQUE: Informed written consent was obtained from the patient after a thorough discussion of the procedural risks, benefits and alternatives. All questions were addressed. Maximal Sterile Barrier Technique was utilized including caps, mask, sterile gowns, sterile gloves, sterile drape, hand hygiene and skin antiseptic. A timeout was performed prior to the initiation of the procedure. Transduction of the left and right pulmonary arterial lysis catheter performed at the bedside. Once the pressures were measured, all catheters were removed. Venous sheath removed from the right common femoral vein approach. Sterile bandages were placed. Patient tolerated the procedure well and remained hemodynamically stable throughout. No complications were encountered and no significant blood loss. FINDINGS: Left PA pressure: 28 Right PA pressure: 25 Measurements performed 11 a.m. 02/07/2018 IMPRESSION: Status post bedside transduction of PA lytic catheters for repeat pressure measurement, as above. Signed, Dulcy Fanny. Dellia Nims, RPVI [facility] Electronically Signed   By: Corrie Mckusick D.O.   On: 02/07/2018 15:56    Assessment & Plan:   Kyan was seen today for anemia.  Diagnoses and all orders for this visit:  Type II diabetes mellitus with manifestations (HCC)-his blood sugar is well controlled. -     Urine Microalbumin w/creat. ratio; Future -     Comprehensive metabolic panel; Future  Iron deficiency anemia due to chronic blood loss-his H&H and iron levels are normal now.  He will stop taking the iron supplement. -     CBC with Differential/Platelet; Future -      Ferritin; Future -     IBC panel; Future  Myalgia- He has muscle aches and a slightly elevated CPK level.  I have asked him  to stop taking the statin for a  few weeks to see if this helps with his symptoms. -     Comprehensive metabolic panel; Future -     CK; Future -     Sedimentation rate; Future  Claudication of both lower extremities (Joseph)- He has lower extremity symptoms and I cannot feel the pulses in his feet.  However, his ABIs are normal.  I will treat this as listed above with respect to the myalgias. -     VAS [place] ABI WITH/WO TBI; Future   I have discontinued Jeneen Rinks A. Daman "Jim"'s acetaminophen, sodium chloride, sulfamethoxazole-trimethoprim, ferrous sulfate, and atorvastatin. I am also having him maintain his Fish Oil, potassium chloride SA, traMADol, TAGRISSO, furosemide, rivaroxaban, and metoprolol tartrate.  No orders of the defined types were placed in this encounter.    Follow-up: Return in about 3 months (around 06/17/2018).  [name], MD

## 2018-03-17 NOTE — Patient Instructions (Signed)
Iron Deficiency Anemia, Adult Iron deficiency anemia is a condition in which the concentration of red blood cells or hemoglobin in the blood is below normal because of too little iron. Hemoglobin is a substance in red blood cells that carries oxygen to the body's tissues. When the concentration of red blood cells or hemoglobin is too low, not enough oxygen reaches these tissues. Iron deficiency anemia is usually long-lasting (chronic) and it develops over time. It may or may not cause symptoms. It is a common type of anemia. What are the causes? This condition may be caused by:  Not enough iron in the diet.  Blood loss caused by bleeding in the intestine.  Blood loss from a gastrointestinal condition like Crohn disease.  Frequent blood draws, such as from blood donation.  Abnormal absorption in the gut.  Heavy menstrual periods in women.  Cancers of the gastrointestinal system, such as colon cancer.  What are the signs or symptoms? Symptoms of this condition may include:  Fatigue.  Headache.  Pale skin, lips, and nail beds.  Poor appetite.  Weakness.  Shortness of breath.  Dizziness.  Cold hands and feet.  Fast or irregular heartbeat.  Irritability. This is more common in severe anemia.  Rapid breathing. This is more common in severe anemia.  Mild anemia may not cause any symptoms. How is this diagnosed? This condition is diagnosed based on:  Your medical history.  A physical exam.  Blood tests.  You may have additional tests to find the underlying cause of your anemia, such as:  Testing for blood in the stool (fecal occult blood test).  A procedure to see inside your colon and rectum (colonoscopy).  A procedure to see inside your esophagus and stomach (endoscopy).  A test in which cells are removed from bone marrow (bone marrow aspiration) or fluid is removed from the bone marrow to be examined (biopsy). This is rarely needed.  How is this  treated? This condition is treated by correcting the cause of your iron deficiency. Treatment may involve:  Adding iron-rich foods to your diet.  Taking iron supplements. If you are pregnant or breastfeeding, you may need to take extra iron because your normal diet usually does not provide the amount of iron that you need.  Increasing vitamin C intake. Vitamin C helps your body absorb iron. Your health care provider may recommend that you take iron supplements along with a glass of orange juice or a vitamin C supplement.  Medicines to make heavy menstrual flow lighter.  Surgery.  You may need repeat blood tests to determine whether treatment is working. Depending on the underlying cause, the anemia should be corrected within 2 months of starting treatment. If the treatment does not seem to be working, you may need more testing. Follow these instructions at home: Medicines  Take over-the-counter and prescription medicines only as told by your health care provider. This includes iron supplements and vitamins.  If you cannot tolerate taking iron supplements by mouth, talk with your health care provider about taking them through a vein (intravenously) or an injection into a muscle.  For the best iron absorption, you should take iron supplements when your stomach is empty. If you cannot tolerate them on an empty stomach, you may need to take them with food.  Do not drink milk or take antacids at the same time as your iron supplements. Milk and antacids may interfere with iron absorption.  Iron supplements can cause constipation. To prevent constipation, include fiber   in your diet as told by your health care provider. A stool softener may also be recommended. Eating and drinking  Talk with your health care provider before changing your diet. He or she may recommend that you eat foods that contain a lot of iron, such as: ? Liver. ? Low-fat (lean) beef. ? Breads and cereals that have iron  added to them (are fortified). ? Eggs. ? Dried fruit. ? Dark green, leafy vegetables.  To help your body use the iron from iron-rich foods, eat those foods at the same time as fresh fruits and vegetables that are high in vitamin C. Foods that are high in vitamin C include: ? Oranges. ? Peppers. ? Tomatoes. ? Mangoes.  Drinkenoughfluid to keep your urine clear or pale yellow. General instructions  Return to your normal activities as told by your health care provider. Ask your health care provider what activities are safe for you.  Practice good hygiene. Anemia can make you more prone to illness and infection.  Keep all follow-up visits as told by your health care provider. This is important. Contact a health care provider if:  You feel nauseous or you vomit.  You feel weak.  You have unexplained sweating.  You develop symptoms of constipation, such as: ? Having fewer than three bowel movements a week. ? Straining to have a bowel movement. ? Having stools that are hard, dry, or larger than normal. ? Feeling full or bloated. ? Pain in the lower abdomen. ? Not feeling relief after having a bowel movement. Get help right away if:  You faint. If this happens, do not drive yourself to the hospital. Call your local emergency services (911 in the [place]  You have chest pain.  You have shortness of breath that: ? Is severe. ? Gets worse with physical activity.  You have a rapid heartbeat.  You become light-headed when getting up from a sitting or lying down position. This information is not intended to replace advice given to you by your health care provider. Make sure you discuss any questions you have with your health care provider. Document Released: 04/24/2000 Document Revised: 01/15/2016 Document Reviewed: 01/15/2016 [employer] Interactive Patient Education  2018 [employer]  

## 2018-03-18 ENCOUNTER — Ambulatory Visit (HOSPITAL_COMMUNITY)
Admission: RE | Admit: 2018-03-18 | Discharge: 2018-03-18 | Disposition: A | Discharge status: Home / Self Care | Payer: Medicare Other | Source: Ambulatory Visit | Attending: Family | Admitting: Family

## 2018-03-18 DIAGNOSIS — I739 Peripheral vascular disease, unspecified: Secondary | ICD-10-CM | POA: Diagnosis not present

## 2018-03-19 ENCOUNTER — Encounter: Payer: Self-pay | Admitting: Internal Medicine

## 2018-03-28 ENCOUNTER — Encounter: Payer: Self-pay | Admitting: Thoracic Surgery (Cardiothoracic Vascular Surgery)

## 2018-03-28 ENCOUNTER — Other Ambulatory Visit: Payer: Self-pay

## 2018-03-28 ENCOUNTER — Ambulatory Visit: Payer: Medicare Other | Admitting: Thoracic Surgery (Cardiothoracic Vascular Surgery)

## 2018-03-28 VITALS — BP 129/75 | HR 64 | Ht 69.0 in | Wt 218.0 lb

## 2018-03-28 DIAGNOSIS — Z9889 Other specified postprocedural states: Secondary | ICD-10-CM | POA: Diagnosis not present

## 2018-03-28 DIAGNOSIS — Z951 Presence of aortocoronary bypass graft: Secondary | ICD-10-CM | POA: Diagnosis not present

## 2018-03-28 NOTE — Progress Notes (Signed)
[address]       [place] [address]             [phone]     CARDIOTHORACIC SURGERY OFFICE NOTE  Referring Provider is Martinique, Peter, MD PCP is [name], MD   HPI:  Patient is a 67 year old obese white male with history of mitral valve prolapse with mitral regurgitation, coronary artery disease, lung cancer status post right upper lobectomy by Dr. [name] in April 2018, obstructive sleep apnea on CPAP, type 2 diabetes mellitus, and hyperlipidemia who returns to the office today for routine follow-up status post mitral valve repair and coronary artery bypass grafting x1 on March 24, 2017.  His early postoperative recovery was uneventful and he was discharged from the hospital on the fifth postoperative day.    He was seen by [name] in follow-up on May 31, 2017 and warfarin anticoagulation was stopped at that time.  I last saw him in the office on July 26, 2017 at which time he was making slow but steady progress.  In May he developed worsening shortness of breath and was found to have a recurrent pleural effusion.  Thoracentesis was performed October 13, 2017 and 1.2 L of fluid drained.  Cytology was positive for adenocarcinoma.  The patient was seen in follow-up by Dr. [name] shortly after that in consideration for possible Pleurx catheter placement was entertained should the patient develop recurrent symptomatic pleural effusion.  He has been followed on several occasions by Dr. Julien Nordmann in the cancer center where he has been undergoing adjuvant treatment for stage IV adenocarcinoma of the lung.  His pleural effusion has not recurred.  However, he recently suffered a massive acute pulmonary embolus for which he underwent thrombolytic therapy on February 08, 2018.  He is now anticoagulated using Xarelto.  He since hospital discharge he has been seen in follow-up by [name], Dr. Julien Nordmann, and Dr. Ronnald Ramp.  He returns to our office today and reports that  he is slowly recovering.  Prior to his PE he had been doing remarkably well over the summer.  He states that his recent hospitalization was the worst of all of his previous hospitalizations, but he is finally starting to feel more like himself again.  He still gets short of breath with activity but his breathing is gradually improving.   Current Outpatient Medications  Medication Sig Dispense Refill  . furosemide (LASIX) 40 MG tablet Take 1 tablet (40 mg total) by mouth as needed for fluid.    . metoprolol tartrate (LOPRESSOR) 25 MG tablet TAKE 1/2 TABLET BY MOUTH 2 TIMES DAILY 90 tablet 3  . Omega-3 Fatty Acids (FISH OIL) 500 MG CAPS Take 500 mg by mouth daily.     . potassium chloride SA (K-DUR,KLOR-CON) 20 MEQ tablet Take 1 tablet (20 mEq total) by mouth daily as needed. With lasix 90 tablet 0  . Prenatal Vit-Fe Fumarate-FA (M-VIT PO) Take 1 tablet by mouth daily.    . rivaroxaban (XARELTO) 20 MG TABS tablet Take 1 tablet (20 mg total) by mouth daily with supper. 90 tablet 3  . TAGRISSO 80 MG tablet TAKE 1 TABLET (80 MG TOTAL) BY MOUTH DAILY. (Patient taking differently: Take 80 mg by mouth daily. ) 30 tablet 2  . traMADol (ULTRAM) 50 MG tablet Take 1 tablet (50 mg total) by mouth every 6 (six) hours as needed (may take one or two tablets every six hrs prn). 30 tablet 0  No current facility-administered medications for this visit.       Physical Exam:   BP 129/75 (BP Location: Right Arm, Patient Position: Sitting, Cuff Size: Large)   Pulse 64   Ht 5\' 9"  (1.753 m)   Wt 218 lb (98.9 kg)   SpO2 98% Comment: ON RA  BMI 32.19 kg/m   General:  Well-appearing  Chest:   Clear to auscultation with slightly diminished breath sounds right lung base  CV:   Regular rate and rhythm without murmur  Incisions:  n/a  Abdomen:  Soft nontender  Extremities:  Warm and well-perfused, mild lower extremity edema  Diagnostic Tests:  Transthoracic Echocardiography  Patient:    James [name], James [name] MR  #:       [id number] Study Date: 02/07/2018 Gender:     M Age:        71 Height:     175.3 cm Weight:     99.8 kg BSA:        2.24 m^2 Pt. Status: Room:       2M07C   Cottie Banda [id number]  [id number]    [facility], [place] [id number]  ATTENDING    Julianne Rice [id number]  ADMITTING    Ismail, Shannon, Inpatient  SONOGRAPHER  Haroldine Laws  cc:  ------------------------------------------------------------------- LV EF: 65% -   70%  ------------------------------------------------------------------- Indications:      Dyspnea 786.09.  ------------------------------------------------------------------- History:   PMH:   Coronary artery disease.  Congestive heart failure.  Risk factors:  Diabetes mellitus. Dyslipidemia.  ------------------------------------------------------------------- Study Conclusions  - Left ventricle: The cavity size was mildly reduced. Wall   thickness was increased in a pattern of mild LVH. Systolic   function was vigorous. The estimated ejection fraction was in the   range of 65% to 70%. - Mitral valve: s/p MV repair. Motion is difficult to see due to   poor acoustic windows Peak and mean gradiaeants through the valve   are 7 and 4 mm Hg respectively. MVA by P T1/2 is 1.75 cm2   consistent with mild MS - Right ventricle: RV is difficult to visualize, even with Definity   OVerall RVEF appears mildly reduced. The cavity size was mildly   dilated. - Right atrium: The atrium was mildly dilated.  ------------------------------------------------------------------- Study data:  Comparison was made to the study of 06/10/2017.  Study status:  Routine.  Procedure:  Patient unable to move easily in bed. Just off movement restrictions from procedure earlier. The patient reported no pain pre or post test. Transthoracic echocardiography. Image quality was poor. The study was technically difficult, as a result of poor acoustic windows,  poor sound wave transmission, and restricted patient mobility.  Study completion: There were no complications.          Transthoracic echocardiography.  M-mode, complete 2D, spectral Doppler, and color Doppler.  Birthdate:  Patient birthdate: Jun 18, 1950.  Age:  Patient is 67 yr old.  Sex:  Gender: male.    BMI: 32.5 kg/m^2.  Blood pressure:     99/61  Patient status:  Inpatient.  Study date: Study date: 02/07/2018. Study time: 03:21 PM.  Location:  Bedside.   -------------------------------------------------------------------  ------------------------------------------------------------------- Left ventricle:  The cavity size was mildly reduced. Wall thickness was increased in a pattern of mild LVH. Systolic function was vigorous. The estimated ejection fraction was in the range of 65% to 70%.  ------------------------------------------------------------------- Aortic valve:   Moderately thickened, moderately calcified leaflets.  Doppler:  There was no regurgitation.  ------------------------------------------------------------------- Aorta:  The aorta was normal, not dilated, and non-diseased.  ------------------------------------------------------------------- Mitral valve:  s/p MV repair Motion is difficult to see due to poor acoustic windows Peak and mean gradients through the valve are 7 and 4 mm Hg respectively. MVA by P T1/2 is 1.75 cm2.  Doppler: Valve area by pressure half-time: 1.75 cm^2. Indexed valve area by pressure half-time: 0.78 cm^2/m^2. Valve area by continuity equation (using LVOT flow): 1.42 cm^2. Indexed valve area by continuity equation (using LVOT flow): 0.63 cm^2/m^2.    Mean gradient (D): 4 mm Hg. Peak gradient (D): 7 mm Hg.  ------------------------------------------------------------------- Left atrium:  The atrium was normal in size.  ------------------------------------------------------------------- Right ventricle:  RV is difficult to  visualize, even with Definity OVerall RVEF appears mildly reduced. The cavity size was mildly dilated.  ------------------------------------------------------------------- Pulmonic valve:   Poorly visualized.  Doppler:  There was no significant regurgitation.  ------------------------------------------------------------------- Tricuspid valve:   Structurally normal valve.   Leaflet separation was normal.  Doppler:  Transvalvular velocity was within the normal range. There was trivial regurgitation.  ------------------------------------------------------------------- Right atrium:  The atrium was mildly dilated.  ------------------------------------------------------------------- Pericardium:  There was no pericardial effusion.  ------------------------------------------------------------------- Systemic veins: Inferior vena cava: The vessel was normal in size. The respirophasic diameter changes were in the normal range (>= 50%), consistent with normal central venous pressure.  ------------------------------------------------------------------- Post procedure conclusions Ascending Aorta:  - The aorta was normal, not dilated, and non-diseased.  ------------------------------------------------------------------- Measurements   Left ventricle                           Value          Reference  LV ID, ED, PLAX chordal                  43    mm       43 - 52  LV ID, ES, PLAX chordal                  28    mm       23 - 38  LV fx shortening, PLAX chordal           35    %        >=29  LV PW thickness, ED                      12    mm       ----------  IVS/LV PW ratio, ED                      1              <=1.3  Stroke volume, 2D                        77    ml       ----------  Stroke volume/bsa, 2D                    34    ml/m^2   ----------  LV e&', lateral                           12.6  cm/s     ----------  LV E/e&', lateral  10.32           ----------  LV e&', medial                            7.94  cm/s     ----------  LV E/e&', medial                          16.37          ----------  LV e&', average                           10.27 cm/s     ----------  LV E/e&', average                         12.66          ----------    Ventricular septum                       Value          Reference  IVS thickness, ED                        12    mm       ----------    LVOT                                     Value          Reference  LVOT ID, S                               20    mm       ----------  LVOT area                                3.14  cm^2     ----------  LVOT peak velocity, S                    119   cm/s     ----------  LVOT mean velocity, S                    76    cm/s     ----------  LVOT VTI, S                              24.6  cm       ----------  LVOT peak gradient, S                    6     mm Hg    ----------    Aorta                                    Value          Reference  Aortic root ID, ED  34    mm       ----------    Left atrium                              Value          Reference  LA ID, A-P, ES                           34    mm       ----------  LA ID/bsa, A-P                           1.52  cm/m^2   <=2.2  LA volume, S                             65.1  ml       ----------  LA volume/bsa, S                         29.1  ml/m^2   ----------  LA volume, ES, 1-p A4C                   62.9  ml       ----------  LA volume/bsa, ES, 1-p A4C               28.1  ml/m^2   ----------  LA volume, ES, 1-p A2C                   57.4  ml       ----------  LA volume/bsa, ES, 1-p A2C               25.7  ml/m^2   ----------    Mitral valve                             Value          Reference  Mitral E-wave peak velocity              130   cm/s     ----------  Mitral A-wave peak velocity              136   cm/s     ----------  Mitral mean velocity, D                  90.5  cm/s      ----------  Mitral pressure half-time                126   ms       ----------  Mitral mean gradient, D                  4     mm Hg    ----------  Mitral peak gradient, D                  7     mm Hg    ----------  Mitral E/A ratio, peak                   1              ----------  Mitral valve area, PHT,  DP               1.75  cm^2     ----------  Mitral valve area/bsa, PHT, DP           0.78  cm^2/m^2 ----------  Mitral valve area, LVOT                  1.42  cm^2     ----------  continuity  Mitral valve area/bsa, LVOT              0.63  cm^2/m^2 ----------  continuity  Mitral annulus VTI, D                    54.3  cm       ----------    Pulmonary arteries                       Value          Reference  PA pressure, S, DP                       30    mm Hg    <=30    Tricuspid valve                          Value          Reference  Tricuspid regurg peak velocity           233   cm/s     ----------  Tricuspid peak RV-RA gradient            22    mm Hg    ----------    Right atrium                             Value          Reference  RA ID, S-I, ES, A4C              (H)     56.5  mm       34 - 49  RA area, ES, A4C                 (H)     20.3  cm^2     8.3 - 19.5  RA volume, ES, A/L                       58.9  ml       ----------  RA volume/bsa, ES, A/L                   26.3  ml/m^2   ----------    Systemic veins                           Value          Reference  Estimated CVP                            8     mm Hg    ----------    Right ventricle                          Value  Reference  TAPSE                                    15    mm       ----------  RV pressure, S, DP                       30    mm Hg    <=30  RV s&', lateral, S                        9.25  cm/s     ----------  Legend: (L)  and  (H)  mark values outside specified reference range.  ------------------------------------------------------------------- Prepared and Electronically Authenticated  by  Dorris Carnes, M.D. 2019-09-30T18:27:03   Impression:  Patient is doing well from a cardiac standpoint approximately 1 year status post mitral valve repair and coronary artery bypass grafting x1.  Unfortunately he has developed progression of his underlying lung cancer with malignant right pleural effusion for which he underwent thoracentesis several months ago.  He has responded well to adjuvant treatment, but last month he suffered a massive acute pulmonary embolus.  Despite all this he seemed to be slowly improving.  Plan:  We have not recommended any change the patient's current medications.  Patient will continue to follow-up with [name], Dr. Ronnald Ramp, Dr. Julien Nordmann.  He will call our office and return should he develop recurrent symptomatic pleural effusion which might benefit from Pleurx catheter placement.  Otherwise he will call and return to see [place] only should specific problems or questions arise.  I spent in excess of 10 minutes during the conduct of this office consultation and >50% of this time involved direct face-to-face encounter with the patient for counseling and/or coordination of their care.    Valentina Gu. Roxy Manns, MD 03/28/2018 12:18 PM

## 2018-03-28 NOTE — Patient Instructions (Addendum)
Continue all previous medications without any changes at this time  

## 2018-03-30 MED FILL — TAGRISSO 80 MG TABLET: 80 | 30 days supply | Qty: 30 | Fill #2

## 2018-04-11 ENCOUNTER — Inpatient Hospital Stay (HOSPITAL_BASED_OUTPATIENT_CLINIC_OR_DEPARTMENT_OTHER): Payer: Medicare Other | Admitting: Internal Medicine

## 2018-04-11 ENCOUNTER — Inpatient Hospital Stay: Payer: Medicare Other | Attending: Internal Medicine

## 2018-04-11 ENCOUNTER — Encounter: Payer: Self-pay | Admitting: Internal Medicine

## 2018-04-11 ENCOUNTER — Telehealth: Payer: Self-pay

## 2018-04-11 VITALS — BP 122/69 | HR 69 | Temp 97.6°F | Resp 17 | Ht 69.0 in | Wt 222.1 lb

## 2018-04-11 DIAGNOSIS — I10 Essential (primary) hypertension: Secondary | ICD-10-CM

## 2018-04-11 DIAGNOSIS — E119 Type 2 diabetes mellitus without complications: Secondary | ICD-10-CM | POA: Insufficient documentation

## 2018-04-11 DIAGNOSIS — Z79899 Other long term (current) drug therapy: Secondary | ICD-10-CM | POA: Insufficient documentation

## 2018-04-11 DIAGNOSIS — C3490 Malignant neoplasm of unspecified part of unspecified bronchus or lung: Secondary | ICD-10-CM

## 2018-04-11 DIAGNOSIS — C3411 Malignant neoplasm of upper lobe, right bronchus or lung: Secondary | ICD-10-CM

## 2018-04-11 DIAGNOSIS — C3491 Malignant neoplasm of unspecified part of right bronchus or lung: Secondary | ICD-10-CM

## 2018-04-11 DIAGNOSIS — Z5111 Encounter for antineoplastic chemotherapy: Secondary | ICD-10-CM

## 2018-04-11 LAB — CBC WITH DIFFERENTIAL (CANCER CENTER ONLY)
Abs Immature Granulocytes: 0.03 10*3/uL (ref 0.00–0.07)
Basophils Absolute: 0 10*3/uL (ref 0.0–0.1)
Basophils Relative: 1 %
Eosinophils Absolute: 0.1 10*3/uL (ref 0.0–0.5)
Eosinophils Relative: 2 %
HCT: 44.8 % (ref 39.0–52.0)
Hemoglobin: 14.1 g/dL (ref 13.0–17.0)
Immature Granulocytes: 1 %
Lymphocytes Relative: 23 %
Lymphs Abs: 1.2 10*3/uL (ref 0.7–4.0)
MCH: 29.3 pg (ref 26.0–34.0)
MCHC: 31.5 g/dL (ref 30.0–36.0)
MCV: 92.9 fL (ref 80.0–100.0)
Monocytes Absolute: 0.6 10*3/uL (ref 0.1–1.0)
Monocytes Relative: 10 %
Neutro Abs: 3.4 10*3/uL (ref 1.7–7.7)
Neutrophils Relative %: 63 %
Platelet Count: 167 10*3/uL (ref 150–400)
RBC: 4.82 MIL/uL (ref 4.22–5.81)
RDW: 15.9 % — ABNORMAL HIGH (ref 11.5–15.5)
WBC Count: 5.3 10*3/uL (ref 4.0–10.5)
nRBC: 0 % (ref 0.0–0.2)

## 2018-04-11 LAB — CMP (CANCER CENTER ONLY)
ALT: 30 U/L (ref 0–44)
AST: 24 U/L (ref 15–41)
Albumin: 3.9 g/dL (ref 3.5–5.0)
Alkaline Phosphatase: 54 U/L (ref 38–126)
Anion gap: 7 (ref 5–15)
BUN: 19 mg/dL (ref 8–23)
CO2: 25 mmol/L (ref 22–32)
Calcium: 9.1 mg/dL (ref 8.9–10.3)
Chloride: 110 mmol/L (ref 98–111)
Creatinine: 1.36 mg/dL — ABNORMAL HIGH (ref 0.61–1.24)
GFR, Est AFR Am: >60 mL/min (ref >60)
GFR, Estimated: 53 mL/min — ABNORMAL LOW (ref >60)
Glucose, Bld: 105 mg/dL — ABNORMAL HIGH (ref 70–99)
Potassium: 4.9 mmol/L (ref 3.5–5.1)
Sodium: 142 mmol/L (ref 135–145)
Total Bilirubin: 0.4 mg/dL (ref 0.3–1.2)
Total Protein: 6.9 g/dL (ref 6.5–8.1)

## 2018-04-11 NOTE — Progress Notes (Signed)
[place] [phone]   [phone]  OFFICE PROGRESS NOTE  [name], MD [address] [facility] [address] [place] [address]  DIAGNOSIS: Recurrent non-small cell lung cancer, adenocarcinoma initially diagnosed as stage IA (T1c, N0, M0) non-small cell lung cancer, adenocarcinoma presented with right upper lobe lung nodule in April 2018 with recurrence in June 2019.  Biomarker Findings Microsatellite status - MS-Stable Tumor Mutational Burden - TMB-Low (3 Muts/Mb) Genomic Findings For a complete list of the genes assayed, please refer to the Appendix. EGFR exon 19 deletion ([id number]>V) CDKN2A/B loss NKX2-1 amplification 7 Disease relevant genes with no reportable alterations: KRAS, ALK, BRAF, MET, RET, ERBB2, ROS1   PRIOR THERAPY: Status post right upper lobectomy with lymph node dissection under the care of Dr. [name] on 09/02/2016.  CURRENT THERAPY: Tagrisso 80 mg p.o. daily.  First dose started November 05, 2017.  Status post 5 months of treatment.  INTERVAL HISTORY: James [name] 67 y.o. male returns to the clinic today for follow-up visit accompanied by his wife.  The patient is feeling fine today with no concerning complaints except for mild fatigue.  He has been tolerating his treatment with Tagrisso fairly well.  He denied having any significant skin rash or diarrhea.  He has no chest pain, shortness of breath, cough or hemoptysis.  He denied having any headache or visual changes.  He had one episode of double vision that lasted for few minutes and resolved spontaneously.  The patient is here today for evaluation and repeat blood work.   MEDICAL HISTORY: Past Medical History:  Diagnosis Date  . Adenocarcinoma of right lung, stage 1 ([place]) 09/06/2016  . Anxiety   . Arthritis    "knees, hips, back" (10/19/2012)  . Chronic diastolic congestive heart failure ([place])   . Chronic lower back pain   . Colon polyps    10/27/2002, repeat  letter 09/17/2007  . Coronary artery disease   . Coronary artery disease involving native coronary artery of native heart without angina pectoris   . Depressive disorder, not elsewhere classified    no meds  . Diabetes mellitus without complication ([place])    diet controlled- no med  (while in hosp 4/18 -elevated cbg  . Dyspnea   . Fasting hyperglycemia   . GERD (gastroesophageal reflux disease)   . Heart murmur   . Hemoptysis    abnormal CT Chest 01/29/10 - ? new GG changes RUL > not viz on plain cxr 02/26/2010  . Hypertension   . Mitral regurgitation    severe MR 08/2016  . MPN (myeloproliferative neoplasm) ([place])    1st detected 06/04/1998  . Obesity   . OSA on CPAP    last sleep study 10 years ago  . Other and unspecified hyperlipidemia   . Peripheral vascular disease ([place]) 08/2016   after lung surgey small clots in lungs,after hip dvt-5/16  . Pneumonia    4/18  . Positive PPD 1965   "non reactive in 2012" (10/19/2012)  . Routine general medical examination at a health care facility   . S/P CABG x 1 03/24/2017   LIMA to LAD  . S/P mitral valve repair 03/24/2017   Complex valvuloplasty including artificial Gore-tex neochord placement x6 and 28 mm Sorin Annuloflex posterior annuloplasty band  . Special screening for malignant neoplasm of prostate   . Spinal stenosis, unspecified region other than cervical   . Wrist pain, left     ALLERGIES:  is  allergic to symbicort [budesonide-formoterol fumarate] and amoxicillin.  MEDICATIONS:  Current Outpatient Medications  Medication Sig Dispense Refill  . furosemide (LASIX) 40 MG tablet Take 1 tablet (40 mg total) by mouth as needed for fluid.    . metoprolol tartrate (LOPRESSOR) 25 MG tablet TAKE 1/2 TABLET BY MOUTH 2 TIMES DAILY 90 tablet 3  . Multiple Vitamin (MULTIVITAMIN) tablet Take 1 tablet by mouth daily.    . Omega-3 Fatty Acids (FISH OIL) 500 MG CAPS Take 500 mg by mouth daily.     . potassium chloride SA (K-DUR,KLOR-CON) 20  MEQ tablet Take 1 tablet (20 mEq total) by mouth daily as needed. With lasix 90 tablet 0  . rivaroxaban (XARELTO) 20 MG TABS tablet Take 1 tablet (20 mg total) by mouth daily with supper. 90 tablet 3  . TAGRISSO 80 MG tablet TAKE 1 TABLET (80 MG TOTAL) BY MOUTH DAILY. (Patient taking differently: Take 80 mg by mouth daily. ) 30 tablet 2  . traMADol (ULTRAM) 50 MG tablet Take 1 tablet (50 mg total) by mouth every 6 (six) hours as needed (may take one or two tablets every six hrs prn). 30 tablet 0   No current facility-administered medications for this visit.     SURGICAL HISTORY:  Past Surgical History:  Procedure Laterality Date  . ANTERIOR CRUCIATE LIGAMENT REPAIR Left 1967  . CARDIAC CATHETERIZATION  2000  . CHEST TUBE INSERTION Right 10/19/2012   post bronch  . COLONOSCOPY W/ POLYPECTOMY    . CORONARY ARTERY BYPASS GRAFT N/A 03/24/2017   Procedure: CORONARY ARTERY BYPASS GRAFTING (CABG)x1 using left internal mammary artery, LIMA-LAD;  Surgeon: Rexene Alberts, MD;  Location: [facility];  Service: Open Heart Surgery;  Laterality: N/A;  . FLEXIBLE BRONCHOSCOPY  10/19/2012   Flexible video fiberoptic bronchoscopy with electromagnetic navigation and biopsies.  . INTRAVASCULAR PRESSURE WIRE/FFR STUDY N/A 08/11/2016   Procedure: Intravascular Pressure Wire/FFR Study;  Surgeon: [name], MD;  Location: [facility];  Service: Cardiovascular;  Laterality: N/A;  . IR ANGIOGRAM PULMONARY BILATERAL SELECTIVE  02/06/2018  . IR ANGIOGRAM SELECTIVE EACH ADDITIONAL VESSEL  02/06/2018  . IR ANGIOGRAM SELECTIVE EACH ADDITIONAL VESSEL  02/06/2018  . IR INFUSION THROMBOL ARTERIAL INITIAL (MS)  02/06/2018  . IR INFUSION THROMBOL ARTERIAL INITIAL (MS)  02/06/2018  . IR THROMB F/U EVAL ART/VEN FINAL DAY (MS)  02/07/2018  . IR US GUIDE VASC ACCESS RIGHT  02/06/2018  . LOBECTOMY Right 09/02/2016   Procedure: RIGHT UPPER LOBECTOMY;  Surgeon: Melrose Nakayama, MD;  Location: [facility];  Service: Thoracic;   Laterality: Right;  . LYMPH NODE DISSECTION Right 09/02/2016   Procedure: LYMPH NODE DISSECTION, RIGHT LUNG;  Surgeon: Melrose Nakayama, MD;  Location: [facility];  Service: Thoracic;  Laterality: Right;  . MITRAL VALVE REPAIR N/A 03/24/2017   Procedure: MITRAL VALVE REPAIR (MVR) with Sorin Carbomedics Annuloflex size 28;  Surgeon: Rexene Alberts, MD;  Location: [facility];  Service: Open Heart Surgery;  Laterality: N/A;  . RIGHT/LEFT HEART CATH AND CORONARY ANGIOGRAPHY N/A 08/11/2016   Procedure: Right/Left Heart Cath and Coronary Angiography;  Surgeon: [name], MD;  Location: [facility];  Service: Cardiovascular;  Laterality: N/A;  . TEE WITHOUT CARDIOVERSION N/A 07/17/2016   Procedure: TRANSESOPHAGEAL ECHOCARDIOGRAM (TEE);  Surgeon: [name], MD;  Location: [facility];  Service: Cardiovascular;  Laterality: N/A;  . TEE WITHOUT CARDIOVERSION N/A 03/24/2017   Procedure: TRANSESOPHAGEAL ECHOCARDIOGRAM (TEE);  Surgeon: Rexene Alberts, MD;  Location: [facility];  Service: Open Heart Surgery;  Laterality: N/A;  . TONSILLECTOMY  1950's  . TOTAL HIP ARTHROPLASTY Left 10/05/2014   dr [name]  . TOTAL HIP ARTHROPLASTY Left 10/05/2014   Procedure: LEFT TOTAL HIP ARTHROPLASTY ANTERIOR APPROACH;  Surgeon: [name], MD;  Location: [facility];  Service: Orthopedics;  Laterality: Left;  Marland Kitchen VIDEO ASSISTED THORACOSCOPY (VATS)/WEDGE RESECTION Right 09/02/2016   Procedure: VIDEO ASSISTED THORACOSCOPY (VATS)/RIGHT UPPER LOBE WEDGE RESECTION;  Surgeon: Melrose Nakayama, MD;  Location: [facility];  Service: Thoracic;  Laterality: Right;  Marland Kitchen VIDEO BRONCHOSCOPY WITH ENDOBRONCHIAL NAVIGATION N/A 10/19/2012   Procedure: VIDEO BRONCHOSCOPY WITH ENDOBRONCHIAL NAVIGATION;  Surgeon: [name], MD;  Location: [facility];  Service: Thoracic;  Laterality: N/A;  . WRIST RECONSTRUCTION Left 12/2009   'proximal row carpectomy" [name]    REVIEW OF SYSTEMS:  A comprehensive review of systems was negative except for:  Constitutional: positive for fatigue   PHYSICAL EXAMINATION: General appearance: alert, cooperative, fatigued and no distress Head: Normocephalic, without obvious abnormality, atraumatic Neck: no adenopathy, no JVD, supple, symmetrical, trachea midline and thyroid not enlarged, symmetric, no tenderness/mass/nodules Lymph nodes: Cervical, supraclavicular, and axillary nodes normal. Resp: clear to auscultation bilaterally Back: symmetric, no curvature. ROM normal. No CVA tenderness. Cardio: regular rate and rhythm, S1, S2 normal, no murmur, click, rub or gallop GI: soft, non-tender; bowel sounds normal; no masses,  no organomegaly Extremities: extremities normal, atraumatic, no cyanosis or edema  ECOG PERFORMANCE STATUS: 1 - Symptomatic but completely ambulatory  Blood pressure 122/69, pulse 69, temperature 97.6 F (36.4 C), temperature source Oral, resp. rate 17, height 5' 9"  (1.753 m), weight 222 lb 1.6 oz (100.7 kg), SpO2 100 %.  LABORATORY DATA: Lab Results  Component Value Date   WBC 5.3 04/11/2018   HGB 14.1 04/11/2018   HCT 44.8 04/11/2018   MCV 92.9 04/11/2018   PLT 167 04/11/2018      Chemistry      Component Value Date/Time   NA 138 03/17/2018 1015   NA 142 10/01/2016 1534   K 4.7 03/17/2018 1015   K 4.2 10/01/2016 1534   CL 105 03/17/2018 1015   CO2 26 03/17/2018 1015   CO2 28 10/01/2016 1534   BUN 22 03/17/2018 1015   BUN 15.9 10/01/2016 1534   CREATININE 1.42 03/17/2018 1015   CREATININE 1.64 (H) 02/28/2018 1344   CREATININE 1.10 02/08/2017 1101   CREATININE 1.5 (H) 10/01/2016 1534      Component Value Date/Time   CALCIUM 9.7 03/17/2018 1015   CALCIUM 9.6 10/01/2016 1534   ALKPHOS 55 03/17/2018 1015   ALKPHOS 65 10/01/2016 1534   AST 29 03/17/2018 1015   AST 29 02/28/2018 1344   AST 20 10/01/2016 1534   ALT 43 03/17/2018 1015   ALT 46 (H) 02/28/2018 1344   ALT 24 10/01/2016 1534   BILITOT 0.5 03/17/2018 1015   BILITOT 0.4 02/28/2018 1344   BILITOT  0.46 10/01/2016 1534       RADIOGRAPHIC STUDIES: Vas [place] Abi With/wo Tbi  Result Date: 03/18/2018 LOWER EXTREMITY DOPPLER STUDY Indications: Preoperative for knee surgery. High Risk Factors: Hyperlipidemia. Other Factors: Decreased pulses.  Limitations: History of DVT and pulmonary embollus Comparison Study: No prior exam Performing Technologist: Alvia Grove RVT  Examination Guidelines: A complete evaluation includes at minimum, Doppler waveform signals and systolic blood pressure reading at the level of bilateral brachial, anterior tibial, and posterior tibial arteries, when vessel segments are accessible. Bilateral testing is considered an integral part  of a complete examination. Photoelectric Plethysmograph (PPG) waveforms and toe systolic pressure readings are included as required and additional duplex testing as needed. Limited examinations for reoccurring indications may be performed as noted.  ABI Findings: +---------+------------------+-----+---------+--------+ Right    Rt Pressure (mmHg)IndexWaveform Comment  +---------+------------------+-----+---------+--------+ Brachial 128                                      +---------+------------------+-----+---------+--------+ PTA      168               1.31 triphasic         +---------+------------------+-----+---------+--------+ DP       117               0.91 triphasic         +---------+------------------+-----+---------+--------+ Great Toe108               0.84 Normal            +---------+------------------+-----+---------+--------+ +---------+------------------+-----+---------+-------+ Left     Lt Pressure (mmHg)IndexWaveform Comment +---------+------------------+-----+---------+-------+ Brachial 119                                     +---------+------------------+-----+---------+-------+ PTA      146               1.14 triphasic        +---------+------------------+-----+---------+-------+ DP        156               1.22 triphasic        +---------+------------------+-----+---------+-------+ Great Toe97                0.76 Normal           +---------+------------------+-----+---------+-------+ +-------+-----------+-----------+------------+------------+ ABI/TBIToday's ABIToday's TBIPrevious ABIPrevious TBI +-------+-----------+-----------+------------+------------+ Right  1.31       0.84                                +-------+-----------+-----------+------------+------------+ Left   1.22       0.76                                +-------+-----------+-----------+------------+------------+  Summary: Right: Resting right ankle-brachial index is within normal range. No evidence of significant right lower extremity arterial disease. Left: Resting left ankle-brachial index is within normal range. No evidence of significant left lower extremity arterial disease. The left toe-brachial index is normal.  *See table(s) above for measurements and observations.  Electronically signed by Servando Snare MD on 03/18/2018 at 12:15:45 PM.    Final     ASSESSMENT AND PLAN: This is a very pleasant 67 years old white male with a stage Ia non-small cell lung cancer status post right upper lobectomy with lymph node dissection on September 02, 2016. The patient has been in observation since that time. Unfortunately there are some concerning findings with nodular density in the posterior right lower lobe as well as increase and right pleural effusion suspicious for disease recurrence. He recently underwent ultrasound-guided right thoracentesis and the cytology of the pleural fluid was consistent with recurrent adenocarcinoma. He had molecular studies performed by foundation 1 that showed positive EGFR mutation with deletion 54. The patient was started on treatment  with Tagrisso 80 mg p.o. daily on 11/05/2017.  He status post 5 months of treatment. The patient has been tolerating this treatment well with  no concerning adverse effects. I recommended for him to continue his current treatment with Tagrisso with the same dose. I will see the patient back for follow-up visit in 1 months for evaluation with repeat CT scan of the chest, abdomen and pelvis for restaging of his disease. He was advised to call immediately if he has any concerning symptoms in the interval. The patient voices understanding of current disease status and treatment options and is in agreement with the current care plan. I spent 10 minutes counseling the patient face to face. The total time spent in the appointment was 15 minutes.  All questions were answered. The patient knows to call the clinic with any problems, questions or concerns. We can certainly see the patient much sooner if necessary.  Disclaimer: This note was dictated with voice recognition software. Similar sounding words can inadvertently be transcribed and may not be corrected upon review.

## 2018-04-11 NOTE — Telephone Encounter (Signed)
Printed avs and calender of upcoming appointment. Per 12/2 los

## 2018-04-20 ENCOUNTER — Other Ambulatory Visit: Payer: Self-pay | Admitting: Internal Medicine

## 2018-04-20 DIAGNOSIS — C3491 Malignant neoplasm of unspecified part of right bronchus or lung: Secondary | ICD-10-CM

## 2018-04-22 ENCOUNTER — Telehealth: Payer: Self-pay | Admitting: Pharmacist

## 2018-04-22 NOTE — Telephone Encounter (Signed)
Oral Oncology Pharmacist Encounter  Prior authorization for [employer] has been approved by [employer]  Ref# [id number] Effective dates: 04/22/18-05/11/19  Johny Drilling, PharmD, BCPS, BCOP  04/22/2018  10:48 AM  Oral Oncology Clinic [phone]

## 2018-04-22 NOTE — Telephone Encounter (Signed)
Oral Oncology Pharmacist Encounter  Received request to complete prior authorization with [employer] for Tagrisso on Cover My Meds  Key: [id number] Status is pending  Johny Drilling, PharmD, BCPS, BCOP  04/22/2018 10:29 AM Oral Oncology Clinic [phone]

## 2018-04-26 MED FILL — TAGRISSO 80 MG TABLET: 80 | 30 days supply | Qty: 30 | Fill #0

## 2018-05-13 ENCOUNTER — Inpatient Hospital Stay: Payer: Medicare Other | Attending: Internal Medicine

## 2018-05-13 ENCOUNTER — Ambulatory Visit (HOSPITAL_COMMUNITY)
Admission: RE | Admit: 2018-05-13 | Discharge: 2018-05-13 | Disposition: A | Discharge status: Home / Self Care | Payer: Medicare Other | Source: Ambulatory Visit | Attending: Internal Medicine | Admitting: Internal Medicine

## 2018-05-13 DIAGNOSIS — C3411 Malignant neoplasm of upper lobe, right bronchus or lung: Secondary | ICD-10-CM | POA: Diagnosis present

## 2018-05-13 DIAGNOSIS — Z79899 Other long term (current) drug therapy: Secondary | ICD-10-CM | POA: Diagnosis not present

## 2018-05-13 DIAGNOSIS — E119 Type 2 diabetes mellitus without complications: Secondary | ICD-10-CM | POA: Insufficient documentation

## 2018-05-13 DIAGNOSIS — C3490 Malignant neoplasm of unspecified part of unspecified bronchus or lung: Secondary | ICD-10-CM

## 2018-05-13 DIAGNOSIS — Z902 Acquired absence of lung [part of]: Secondary | ICD-10-CM | POA: Diagnosis not present

## 2018-05-13 DIAGNOSIS — Z7901 Long term (current) use of anticoagulants: Secondary | ICD-10-CM | POA: Insufficient documentation

## 2018-05-13 DIAGNOSIS — Z86711 Personal history of pulmonary embolism: Secondary | ICD-10-CM | POA: Diagnosis not present

## 2018-05-13 DIAGNOSIS — I10 Essential (primary) hypertension: Secondary | ICD-10-CM | POA: Diagnosis not present

## 2018-05-13 DIAGNOSIS — Z86718 Personal history of other venous thrombosis and embolism: Secondary | ICD-10-CM | POA: Insufficient documentation

## 2018-05-13 LAB — CMP (CANCER CENTER ONLY)
ALT: 40 U/L (ref 0–44)
AST: 32 U/L (ref 15–41)
Albumin: 4 g/dL (ref 3.5–5.0)
Alkaline Phosphatase: 56 U/L (ref 38–126)
Anion gap: 10 (ref 5–15)
BUN: 21 mg/dL (ref 8–23)
CO2: 25 mmol/L (ref 22–32)
Calcium: 9.5 mg/dL (ref 8.9–10.3)
Chloride: 107 mmol/L (ref 98–111)
Creatinine: 1.43 mg/dL — ABNORMAL HIGH (ref 0.61–1.24)
GFR, Est AFR Am: 58 mL/min — ABNORMAL LOW (ref >60)
GFR, Estimated: 50 mL/min — ABNORMAL LOW (ref >60)
Glucose, Bld: 109 mg/dL — ABNORMAL HIGH (ref 70–99)
Potassium: 4.5 mmol/L (ref 3.5–5.1)
Sodium: 142 mmol/L (ref 135–145)
Total Bilirubin: 0.5 mg/dL (ref 0.3–1.2)
Total Protein: 7.3 g/dL (ref 6.5–8.1)

## 2018-05-13 LAB — CBC WITH DIFFERENTIAL (CANCER CENTER ONLY)
Abs Immature Granulocytes: 0.02 10*3/uL (ref 0.00–0.07)
Basophils Absolute: 0 10*3/uL (ref 0.0–0.1)
Basophils Relative: 0 %
Eosinophils Absolute: 0.1 10*3/uL (ref 0.0–0.5)
Eosinophils Relative: 1 %
HCT: 48.7 % (ref 39.0–52.0)
Hemoglobin: 15.5 g/dL (ref 13.0–17.0)
Immature Granulocytes: 0 %
Lymphocytes Relative: 20 %
Lymphs Abs: 1.2 10*3/uL (ref 0.7–4.0)
MCH: 28.5 pg (ref 26.0–34.0)
MCHC: 31.8 g/dL (ref 30.0–36.0)
MCV: 89.7 fL (ref 80.0–100.0)
Monocytes Absolute: 0.6 10*3/uL (ref 0.1–1.0)
Monocytes Relative: 10 %
Neutro Abs: 4.2 10*3/uL (ref 1.7–7.7)
Neutrophils Relative %: 69 %
Platelet Count: 141 10*3/uL — ABNORMAL LOW (ref 150–400)
RBC: 5.43 MIL/uL (ref 4.22–5.81)
RDW: 15.9 % — ABNORMAL HIGH (ref 11.5–15.5)
WBC Count: 6.2 10*3/uL (ref 4.0–10.5)
nRBC: 0 % (ref 0.0–0.2)

## 2018-05-13 MED ORDER — SODIUM CHLORIDE (PF) 0.9 % IJ SOLN
INTRAMUSCULAR | Status: AC
  Filled 2018-05-13: qty 50

## 2018-05-13 MED ORDER — IOHEXOL 300 MG/ML  SOLN
100.0000 mL | Freq: Once | INTRAMUSCULAR | Status: AC | PRN
  Administered 2018-05-13: 100 mL via INTRAVENOUS

## 2018-05-16 ENCOUNTER — Encounter: Payer: Self-pay | Admitting: Internal Medicine

## 2018-05-16 ENCOUNTER — Inpatient Hospital Stay: Payer: Medicare Other | Admitting: Internal Medicine

## 2018-05-16 ENCOUNTER — Telehealth: Payer: Self-pay

## 2018-05-16 VITALS — BP 151/78 | HR 77 | Temp 98.6°F | Resp 18 | Ht 69.0 in | Wt 225.0 lb

## 2018-05-16 DIAGNOSIS — I2699 Other pulmonary embolism without acute cor pulmonale: Secondary | ICD-10-CM | POA: Diagnosis not present

## 2018-05-16 DIAGNOSIS — E119 Type 2 diabetes mellitus without complications: Secondary | ICD-10-CM

## 2018-05-16 DIAGNOSIS — C3411 Malignant neoplasm of upper lobe, right bronchus or lung: Secondary | ICD-10-CM | POA: Diagnosis not present

## 2018-05-16 DIAGNOSIS — Z5111 Encounter for antineoplastic chemotherapy: Secondary | ICD-10-CM

## 2018-05-16 DIAGNOSIS — I10 Essential (primary) hypertension: Secondary | ICD-10-CM

## 2018-05-16 DIAGNOSIS — C3491 Malignant neoplasm of unspecified part of right bronchus or lung: Secondary | ICD-10-CM

## 2018-05-16 DIAGNOSIS — Z7901 Long term (current) use of anticoagulants: Secondary | ICD-10-CM

## 2018-05-16 NOTE — Telephone Encounter (Signed)
Printed avs and calender of upcoming appointment. Per 1/6 los

## 2018-05-16 NOTE — Progress Notes (Signed)
[name] [phone]   [phone]  OFFICE PROGRESS NOTE  [name], MD [address] [facility] [address] [name] [id number]  DIAGNOSIS: Recurrent non-small cell lung cancer, adenocarcinoma initially diagnosed as stage IA (T1c, N0, M0) non-small cell lung cancer, adenocarcinoma presented with right upper lobe lung nodule in April 2018 with recurrence in June 2019.  Biomarker Findings Microsatellite status - MS-Stable Tumor Mutational Burden - TMB-Low (3 Muts/Mb) Genomic Findings For a complete list of the genes assayed, please refer to the Appendix. EGFR exon 19 deletion ([id number]>R) CDKN2A/B loss NKX2-1 amplification 7 Disease relevant genes with no reportable alterations: KRAS, ALK, BRAF, MET, RET, ERBB2, ROS1   PRIOR THERAPY: Status post right upper lobectomy with lymph node dissection under the care of Dr. [name] on 09/02/2016.  CURRENT THERAPY: Tagrisso 80 mg p.o. daily.  First dose started November 05, 2017.  Status post 6 months of treatment.  INTERVAL HISTORY: James [name] 68 y.o. male returns to the clinic today for follow-up visit accompanied by his wife.  The patient is feeling fine today with no concerning complaints except for increasing tears.  He is using over-the-counter teardrops.  The patient denied having any current chest pain, shortness of breath, cough or hemoptysis.  He denied having any fever or chills.  He has no nausea, vomiting, diarrhea or constipation.  He denied having any skin rash.  He has no headache or visual changes.  He had repeat CT scan of the chest, abdomen and pelvis performed recently and he is here for evaluation and discussion of his scan results.  MEDICAL HISTORY: Past Medical History:  Diagnosis Date  . Adenocarcinoma of right lung, stage 1 (James [name]) 09/06/2016  . Anxiety   . Arthritis    "knees, hips, back" (10/19/2012)  . Chronic diastolic congestive heart failure (James [name])   . Chronic lower  back pain   . Colon polyps    10/27/2002, repeat letter 09/17/2007  . Coronary artery disease   . Coronary artery disease involving native coronary artery of native heart without angina pectoris   . Depressive disorder, not elsewhere classified    no meds  . Diabetes mellitus without complication (James [name])    diet controlled- no med  (while in hosp 4/18 -elevated cbg  . Dyspnea   . Fasting hyperglycemia   . GERD (gastroesophageal reflux disease)   . Heart murmur   . Hemoptysis    abnormal CT Chest 01/29/10 - ? new GG changes RUL > not viz on plain cxr 02/26/2010  . Hypertension   . Mitral regurgitation    severe MR 08/2016  . MPN (myeloproliferative neoplasm) (James [name])    1st detected 06/04/1998  . Obesity   . OSA on CPAP    last sleep study 10 years ago  . Other and unspecified hyperlipidemia   . Peripheral vascular disease (James [name]) 08/2016   after lung surgey small clots in lungs,after hip dvt-5/16  . Pneumonia    4/18  . Positive PPD 1965   "non reactive in 2012" (10/19/2012)  . Routine general medical examination at a health care facility   . S/P CABG x 1 03/24/2017   James [name] to LAD  . S/P mitral valve repair 03/24/2017   Complex valvuloplasty including artificial Gore-tex neochord placement x6 and 28 mm Sorin Annuloflex posterior annuloplasty band  . Special screening for malignant neoplasm of prostate   . Spinal stenosis, unspecified region other than cervical   . Wrist pain,  left     ALLERGIES:  is allergic to symbicort [budesonide-formoterol fumarate] and amoxicillin.  MEDICATIONS:  Current Outpatient Medications  Medication Sig Dispense Refill  . furosemide (LASIX) 40 MG tablet Take 1 tablet (40 mg total) by mouth as needed for fluid.    . metoprolol tartrate (LOPRESSOR) 25 MG tablet TAKE 1/2 TABLET BY MOUTH 2 TIMES DAILY 90 tablet 3  . Multiple Vitamin (MULTIVITAMIN) tablet Take 1 tablet by mouth daily.    . Omega-3 Fatty Acids (FISH OIL) 500 MG CAPS Take 500 mg by mouth daily.      . potassium chloride SA (K-DUR,KLOR-CON) 20 MEQ tablet Take 1 tablet (20 mEq total) by mouth daily as needed. With lasix 90 tablet 0  . rivaroxaban (XARELTO) 20 MG TABS tablet Take 1 tablet (20 mg total) by mouth daily with supper. 90 tablet 3  . TAGRISSO 80 MG tablet TAKE 1 TABLET (80 MG TOTAL) BY MOUTH DAILY. 30 tablet 2  . traMADol (ULTRAM) 50 MG tablet Take 1 tablet (50 mg total) by mouth every 6 (six) hours as needed (may take one or two tablets every six hrs prn). 30 tablet 0   No current facility-administered medications for this visit.     SURGICAL HISTORY:  Past Surgical History:  Procedure Laterality Date  . ANTERIOR CRUCIATE LIGAMENT REPAIR Left 1967  . CARDIAC CATHETERIZATION  2000  . CHEST TUBE INSERTION Right 10/19/2012   post bronch  . COLONOSCOPY W/ POLYPECTOMY    . CORONARY ARTERY BYPASS GRAFT N/A 03/24/2017   Procedure: CORONARY ARTERY BYPASS GRAFTING (CABG)x1 using left internal mammary artery, [name]-LAD;  Surgeon: Rexene Alberts, MD;  Location: [facility];  Service: Open Heart Surgery;  Laterality: N/A;  . FLEXIBLE BRONCHOSCOPY  10/19/2012   Flexible video fiberoptic bronchoscopy with electromagnetic navigation and biopsies.  . INTRAVASCULAR PRESSURE WIRE/FFR STUDY N/A 08/11/2016   Procedure: Intravascular Pressure Wire/FFR Study;  Surgeon: [name], MD;  Location: [place] [facility];  Service: Cardiovascular;  Laterality: N/A;  . IR ANGIOGRAM PULMONARY BILATERAL SELECTIVE  02/06/2018  . IR ANGIOGRAM SELECTIVE EACH ADDITIONAL VESSEL  02/06/2018  . IR ANGIOGRAM SELECTIVE EACH ADDITIONAL VESSEL  02/06/2018  . IR INFUSION THROMBOL ARTERIAL INITIAL (MS)  02/06/2018  . IR INFUSION THROMBOL ARTERIAL INITIAL (MS)  02/06/2018  . IR THROMB F/U EVAL ART/VEN FINAL DAY (MS)  02/07/2018  . IR US GUIDE VASC ACCESS RIGHT  02/06/2018  . LOBECTOMY Right 09/02/2016   Procedure: RIGHT UPPER LOBECTOMY;  Surgeon: Melrose Nakayama, MD;  Location: [facility];  Service: Thoracic;  Laterality:  Right;  . LYMPH NODE DISSECTION Right 09/02/2016   Procedure: LYMPH NODE DISSECTION, RIGHT LUNG;  Surgeon: Melrose Nakayama, MD;  Location: [facility];  Service: Thoracic;  Laterality: Right;  . MITRAL VALVE REPAIR N/A 03/24/2017   Procedure: MITRAL VALVE REPAIR (MVR) with Sorin Carbomedics Annuloflex size 28;  Surgeon: Rexene Alberts, MD;  Location: [place];  Service: Open Heart Surgery;  Laterality: N/A;  . RIGHT/LEFT HEART CATH AND CORONARY ANGIOGRAPHY N/A 08/11/2016   Procedure: Right/Left Heart Cath and Coronary Angiography;  Surgeon: [name], MD;  Location: [place] [facility];  Service: Cardiovascular;  Laterality: N/A;  . TEE WITHOUT CARDIOVERSION N/A 07/17/2016   Procedure: TRANSESOPHAGEAL ECHOCARDIOGRAM (TEE);  Surgeon: [name], MD;  Location[facility] ENDOSCOPY;  Service: Cardiovascular;  Laterality: N/A;  . TEE WITHOUT CARDIOVERSION N/A 03/24/2017   Procedure: TRANSESOPHAGEAL ECHOCARDIOGRAM (TEE);  Surgeon: Rexene Alberts, MD;  Location[facility]  OR;  Service: Open Heart Surgery;  Laterality: N/A;  . TONSILLECTOMY  1950's  . TOTAL HIP ARTHROPLASTY Left 10/05/2014   dr [name]  . TOTAL HIP ARTHROPLASTY Left 10/05/2014   Procedure: LEFT TOTAL HIP ARTHROPLASTY ANTERIOR APPROACH;  Surgeon: [name], MD;  Location: [place];  Service: Orthopedics;  Laterality: Left;  Marland Kitchen VIDEO ASSISTED THORACOSCOPY (VATS)/WEDGE RESECTION Right 09/02/2016   Procedure: VIDEO ASSISTED THORACOSCOPY (VATS)/RIGHT UPPER LOBE WEDGE RESECTION;  Surgeon: Melrose Nakayama, MD;  Location: [place];  Service: Thoracic;  Laterality: Right;  Marland Kitchen VIDEO BRONCHOSCOPY WITH ENDOBRONCHIAL NAVIGATION N/A 10/19/2012   Procedure: VIDEO BRONCHOSCOPY WITH ENDOBRONCHIAL NAVIGATION;  Surgeon: [name], MD;  Location: [place];  Service: Thoracic;  Laterality: N/A;  . WRIST RECONSTRUCTION Left 12/2009   'proximal row carpectomy" [name]    REVIEW OF SYSTEMS:  Constitutional: negative Eyes: negative Ears, nose, mouth, throat, and face:  negative Respiratory: negative Cardiovascular: negative Gastrointestinal: negative Genitourinary:negative Integument/breast: negative Hematologic/lymphatic: negative Musculoskeletal:negative Neurological: negative Behavioral/Psych: negative Endocrine: negative Allergic/Immunologic: negative   PHYSICAL EXAMINATION: General appearance: alert, cooperative and no distress Head: Normocephalic, without obvious abnormality, atraumatic Neck: no adenopathy, no JVD, supple, symmetrical, trachea midline and thyroid not enlarged, symmetric, no tenderness/mass/nodules Lymph nodes: Cervical, supraclavicular, and axillary nodes normal. Resp: clear to auscultation bilaterally Back: symmetric, no curvature. ROM normal. No CVA tenderness. Cardio: regular rate and rhythm, S1, S2 normal, no murmur, click, rub or gallop GI: soft, non-tender; bowel sounds normal; no masses,  no organomegaly Extremities: extremities normal, atraumatic, no cyanosis or edema Neurologic: Alert and oriented X 3, normal strength and tone. Normal symmetric reflexes. Normal coordination and gait  ECOG PERFORMANCE STATUS: 1 - Symptomatic but completely ambulatory  Blood pressure (!) 151/78, pulse 77, temperature 98.6 F (37 C), temperature source Oral, resp. rate 18, height 5' 9"  (1.753 m), weight 225 lb (102.1 kg), SpO2 98 %.  LABORATORY DATA: Lab Results  Component Value Date   WBC 6.2 05/13/2018   HGB 15.5 05/13/2018   HCT 48.7 05/13/2018   MCV 89.7 05/13/2018   PLT 141 (L) 05/13/2018      Chemistry      Component Value Date/Time   NA 142 05/13/2018 1230   NA 142 10/01/2016 1534   K 4.5 05/13/2018 1230   K 4.2 10/01/2016 1534   CL 107 05/13/2018 1230   CO2 25 05/13/2018 1230   CO2 28 10/01/2016 1534   BUN 21 05/13/2018 1230   BUN 15.9 10/01/2016 1534   CREATININE 1.43 (H) 05/13/2018 1230   CREATININE 1.10 02/08/2017 1101   CREATININE 1.5 (H) 10/01/2016 1534      Component Value Date/Time   CALCIUM 9.5  05/13/2018 1230   CALCIUM 9.6 10/01/2016 1534   ALKPHOS 56 05/13/2018 1230   ALKPHOS 65 10/01/2016 1534   AST 32 05/13/2018 1230   AST 20 10/01/2016 1534   ALT 40 05/13/2018 1230   ALT 24 10/01/2016 1534   BILITOT 0.5 05/13/2018 1230   BILITOT 0.46 10/01/2016 1534       RADIOGRAPHIC STUDIES: Ct Chest W Contrast  Result Date: 05/13/2018 CLINICAL DATA:  Non-small cell lung cancer. EXAM: CT CHEST, ABDOMEN, AND PELVIS WITH CONTRAST TECHNIQUE: Multidetector CT imaging of the chest, abdomen and pelvis was performed following the standard protocol during bolus administration of intravenous contrast. CONTRAST:  165m OMNIPAQUE IOHEXOL 300 MG/ML  SOLN COMPARISON:  The CT scan 02/11/2018 and PET-CT 10/25/2017 FINDINGS: CT CHEST FINDINGS Cardiovascular: The heart is normal in size. No pericardial effusion. The  aorta is normal in caliber. Stable atherosclerotic calcifications but no focal aneurysm or dissection. Stable coronary artery calcifications and surgical changes from coronary artery bypass surgery. Stable mitral valve annular calcifications and aortic valve calcifications. Mediastinum/Nodes: No mediastinal or hilar mass or lymphadenopathy. 6.5 mm right-sided subcarinal lymph node on image number 31 is stable. The thyroid gland appears normal. The esophagus is grossly normal. Lungs/Pleura: Stable surgical changes from a prior right upper lobe lobectomy. No findings suspicious for recurrent tumor. Persistent small right pleural effusion and scarring changes in the right lung. Stable 4.5 mm right lung nodule on image number 67. A few small peripheral lung nodules in the right lower lobe on image number 79 are stable. Few small calcified granulomas are noted in the left lung. Persistent patchy area of tree-in-bud type findings in the left upper lobe, likely chronic inflammation. Musculoskeletal: No chest wall mass, supraclavicular or axillary adenopathy. The bony structures are intact. No worrisome bone  lesions. Changes of DISH are noted. CT ABDOMEN PELVIS FINDINGS Hepatobiliary: Stable hepatic cysts. No worrisome hepatic lesions or intrahepatic biliary dilatation. The gallbladder appears normal. No common bile duct dilatation. Pancreas: No mass, inflammation or ductal dilatation. Spleen: Normal size.  No focal lesions. Adrenals/Urinary Tract: The adrenal glands and kidneys are unremarkable. There is a duplicated collecting system on the left side and a lower pole moiety parapelvic cyst which was clearly hemorrhagic on the prior study and is less hemorrhagic on today's study. No worrisome renal lesions. The bladder is unremarkable. Stomach/Bowel: The stomach, duodenum, small bowel and colon are unremarkable. No acute inflammatory changes, mass lesions or obstructive findings. The terminal ileum and appendix are normal. Colonic diverticulosis without findings for acute diverticulitis. Vascular/Lymphatic: The aorta is normal in caliber. No dissection. Scattered atherosclerotic calcifications. The branch vessels are patent. The major venous structures are patent. No mesenteric or retroperitoneal mass or adenopathy. Small scattered lymph nodes are noted. Reproductive: Mild prostate gland enlargement. The seminal vesicles appear normal in stable. Other: Bulging anterior abdominal wall but no discrete hernia. Musculoskeletal: No significant bony findings. A left total hip arthroplasty is noted with moderate artifact through the pelvis. IMPRESSION: 1. Status post right upper lobectomy for non-small cell lung cancer. No findings suspicious for recurrent tumor. 2. No mediastinal or hilar mass or adenopathy. 3. Small pulmonary nodules in the right lung are stable. No new or progressive findings. Stable small right pleural effusion. 4. Stable chronic appearing inflammatory changes in left upper lobe medially. 5. No findings for abdominal/pelvic metastatic disease. Electronically Signed   By: Marijo Sanes M.D.   On:  05/13/2018 15:59   Ct Abdomen Pelvis W Contrast  Result Date: 05/13/2018 CLINICAL DATA:  Non-small cell lung cancer. EXAM: CT CHEST, ABDOMEN, AND PELVIS WITH CONTRAST TECHNIQUE: Multidetector CT imaging of the chest, abdomen and pelvis was performed following the standard protocol during bolus administration of intravenous contrast. CONTRAST:  118m OMNIPAQUE IOHEXOL 300 MG/ML  SOLN COMPARISON:  The CT scan 02/11/2018 and PET-CT 10/25/2017 FINDINGS: CT CHEST FINDINGS Cardiovascular: The heart is normal in size. No pericardial effusion. The aorta is normal in caliber. Stable atherosclerotic calcifications but no focal aneurysm or dissection. Stable coronary artery calcifications and surgical changes from coronary artery bypass surgery. Stable mitral valve annular calcifications and aortic valve calcifications. Mediastinum/Nodes: No mediastinal or hilar mass or lymphadenopathy. 6.5 mm right-sided subcarinal lymph node on image number 31 is stable. The thyroid gland appears normal. The esophagus is grossly normal. Lungs/Pleura: Stable surgical changes from a prior right upper  lobe lobectomy. No findings suspicious for recurrent tumor. Persistent small right pleural effusion and scarring changes in the right lung. Stable 4.5 mm right lung nodule on image number 67. A few small peripheral lung nodules in the right lower lobe on image number 79 are stable. Few small calcified granulomas are noted in the left lung. Persistent patchy area of tree-in-bud type findings in the left upper lobe, likely chronic inflammation. Musculoskeletal: No chest wall mass, supraclavicular or axillary adenopathy. The bony structures are intact. No worrisome bone lesions. Changes of DISH are noted. CT ABDOMEN PELVIS FINDINGS Hepatobiliary: Stable hepatic cysts. No worrisome hepatic lesions or intrahepatic biliary dilatation. The gallbladder appears normal. No common bile duct dilatation. Pancreas: No mass, inflammation or ductal dilatation.  Spleen: Normal size.  No focal lesions. Adrenals/Urinary Tract: The adrenal glands and kidneys are unremarkable. There is a duplicated collecting system on the left side and a lower pole moiety parapelvic cyst which was clearly hemorrhagic on the prior study and is less hemorrhagic on today's study. No worrisome renal lesions. The bladder is unremarkable. Stomach/Bowel: The stomach, duodenum, small bowel and colon are unremarkable. No acute inflammatory changes, mass lesions or obstructive findings. The terminal ileum and appendix are normal. Colonic diverticulosis without findings for acute diverticulitis. Vascular/Lymphatic: The aorta is normal in caliber. No dissection. Scattered atherosclerotic calcifications. The branch vessels are patent. The major venous structures are patent. No mesenteric or retroperitoneal mass or adenopathy. Small scattered lymph nodes are noted. Reproductive: Mild prostate gland enlargement. The seminal vesicles appear normal in stable. Other: Bulging anterior abdominal wall but no discrete hernia. Musculoskeletal: No significant bony findings. A left total hip arthroplasty is noted with moderate artifact through the pelvis. IMPRESSION: 1. Status post right upper lobectomy for non-small cell lung cancer. No findings suspicious for recurrent tumor. 2. No mediastinal or hilar mass or adenopathy. 3. Small pulmonary nodules in the right lung are stable. No new or progressive findings. Stable small right pleural effusion. 4. Stable chronic appearing inflammatory changes in left upper lobe medially. 5. No findings for abdominal/pelvic metastatic disease. Electronically Signed   By: Marijo Sanes M.D.   On: 05/13/2018 15:59    ASSESSMENT AND PLAN: This is a very pleasant 67 years old white male with a stage Ia non-small cell lung cancer status post right upper lobectomy with lymph node dissection on September 02, 2016. The patient he was on observation since that time. Unfortunately there are  some concerning findings with nodular density in the posterior right lower lobe as well as increase and right pleural effusion suspicious for disease recurrence. He underwent ultrasound-guided right thoracentesis and the cytology of the pleural fluid was consistent with recurrent adenocarcinoma. He had molecular studies performed by foundation 1 that showed positive EGFR mutation with deletion 36. The patient was started on treatment with Tagrisso 80 mg p.o. daily on 11/05/2017.  He status post 6 months of treatment. He has been tolerating this treatment well with no concerning adverse effects. He had repeat CT scan of the chest, abdomen and pelvis performed recently.  I personally and independently reviewed the scan images and discussed the results with the patient and his wife.  His scan showed no concerning findings for disease progression. I recommended for the patient to continue his current treatment with Tagrisso with the same dose. For the recent history of pulmonary embolism, we will continue his treatment with Xarelto. I will see the patient back for follow-up visit in 1 months for evaluation and repeat blood  work. He was advised to call immediately if he has any concerning symptoms in the interval. The patient voices understanding of current disease status and treatment options and is in agreement with the current care plan.  All questions were answered. The patient knows to call the clinic with any problems, questions or concerns. We can certainly see the patient much sooner if necessary.  Disclaimer: This note was dictated with voice recognition software. Similar sounding words can inadvertently be transcribed and may not be corrected upon review.

## 2018-05-24 MED FILL — TAGRISSO 80 MG TABLET: 80 | 30 days supply | Qty: 30 | Fill #1

## 2018-06-05 NOTE — Progress Notes (Signed)
Cardiology Office Note    Date:  06/09/2018   ID:  James [name], DOB Dec 14, 1950, MRN [id number]  PCP:  [name], MD  Cardiologist:  [name]   Chief Complaint  Patient presents with  . Coronary Artery Disease  . Mitral Valve Prolapse    History of Present Illness:  James [name] is a 68 y.o. male with PMH of DM II, HLD, and severe MR. he was originally noted to have a loud murmur by his primary care physician.  Initial echocardiogram suggested mild MR, however it also suggested partially flail leaflet.  MR was thought to be underestimated.  TEE showed a flail P3 segment with ruptured cord and a severe MR.  Cardiac catheterization performed on 08/11/2016 showed 65% mid to distal LAD lesion, FFR borderline at 0.81, EF 65%.  Normal cardiac output and RV/LV filling pressure.  He had a PET scan on 08/17/2016 that showed a hypermetabolic solid 2.6 cm anterior right upper lobe pulmonary nodule compatible with prior bronchogenic, no hypermetabolic thoracic lymphadenopathy or distal metastatic disease.  There were nonspecific and mild asymmetric hypermetabolism in the left pontine tonsil and the left tongue base region.  CT surgery recommended lobectomy for his lung mass with possible CABG with LIMA to LAD and MV repair.  He underwent right upper lobe lobectomy in April.  Pathology positive for adenocarcinoma stage Ia.  Serial CT scan was recommended by oncology.  Post procedure, he became very short of breath.  CTA was positive for a small PE, he was started on Xarelto.  Repeat echocardiogram obtained on 02/15/2017 showed EF 60-65%, persistent MR.  He eventually underwent mitral valve repair with Sorin Carbomedics Annuloglex size 28, LIMA to LAD by Dr. Roxy Manns. He was DC on coumadin instead of Xarelto. Completed 6 months of anticoagulation for provoked PE and then it was stopped.   When last seen in April he was progressing well. More recently in August CT showed a right pleural effusion.  Thoracentesis was positive for recurrent CA. He was started on Tagrisso by Dr. Inda Merlin. He was admitted on February 06, 2018 with submassive PE. He was treated with  EKOS with bilateral intra-arterial TPA, patient completed 6 days of IV heparin.The original plan was to transition to Xarelto after 5 days of IV heparin. However he developed left flank pain, hematuria and right leg painful swelling. Hence IV heparin was continued. Both Urology andOIrthopedics evaluated and cleared for transitioning to [place].  Xarelto was started on 02/12/2018.  Hematuria decreased  Right leg pain also improved. MRI did show evidence of hematoma that was improving. He had chronic DVT in right peroneal vein.  Since patient was started on Xarelto, aspirin discontinued to minimize bleeding risk. He had follow up CT in January showing no evidence of progression of CA.   On follow up today he continues to improve. He was able to walk 3.3 miles yesterday in one hour. He still notes some dyspnea when he first starts walking but then this gets better. Mild ankle swelling but hasn't used any lasix. Wears support hose. He was having a lot of leg cramps in October. CPK was elevated. Lipitor was stopped. He states cramps are still there. No chest pain.  Past Medical History:  Diagnosis Date  . Adenocarcinoma of right lung, stage 1 ([place]) 09/06/2016  . Anxiety   . Arthritis    "knees, hips, back" (10/19/2012)  . Chronic diastolic congestive heart failure ([place])   . Chronic lower back pain   .  Colon polyps    10/27/2002, repeat letter 09/17/2007  . Coronary artery disease   . Coronary artery disease involving native coronary artery of native heart without angina pectoris   . Depressive disorder, not elsewhere classified    no meds  . Diabetes mellitus without complication ([place])    diet controlled- no med  (while in hosp 4/18 -elevated cbg  . Dyspnea   . Fasting hyperglycemia   . GERD (gastroesophageal reflux disease)   . Heart  murmur   . Hemoptysis    abnormal CT Chest 01/29/10 - ? new GG changes RUL > not viz on plain cxr 02/26/2010  . Hypertension   . Mitral regurgitation    severe MR 08/2016  . MPN (myeloproliferative neoplasm) ([place])    1st detected 06/04/1998  . Obesity   . OSA on CPAP    last sleep study 10 years ago  . Other and unspecified hyperlipidemia   . Peripheral vascular disease ([place]) 08/2016   after lung surgey small clots in lungs,after hip dvt-5/16  . Pneumonia    4/18  . Positive PPD 1965   "non reactive in 2012" (10/19/2012)  . Routine general medical examination at a health care facility   . S/P CABG x 1 03/24/2017   LIMA to LAD  . S/P mitral valve repair 03/24/2017   Complex valvuloplasty including artificial Gore-tex neochord placement x6 and 28 mm Sorin Annuloflex posterior annuloplasty band  . Special screening for malignant neoplasm of prostate   . Spinal stenosis, unspecified region other than cervical   . Wrist pain, left     Past Surgical History:  Procedure Laterality Date  . ANTERIOR CRUCIATE LIGAMENT REPAIR Left 1967  . CARDIAC CATHETERIZATION  2000  . CHEST TUBE INSERTION Right 10/19/2012   post bronch  . COLONOSCOPY W/ POLYPECTOMY    . CORONARY ARTERY BYPASS GRAFT N/A 03/24/2017   Procedure: CORONARY ARTERY BYPASS GRAFTING (CABG)x1 using left internal mammary artery, LIMA-LAD;  Surgeon: Rexene Alberts, MD;  Location: [place];  Service: Open Heart Surgery;  Laterality: N/A;  . FLEXIBLE BRONCHOSCOPY  10/19/2012   Flexible video fiberoptic bronchoscopy with electromagnetic navigation and biopsies.  . INTRAVASCULAR PRESSURE WIRE/FFR STUDY N/A 08/11/2016   Procedure: Intravascular Pressure Wire/FFR Study;  Surgeon: Blossom Crume M Martinique, MD;  Location: [facility];  Service: Cardiovascular;  Laterality: N/A;  . IR ANGIOGRAM PULMONARY BILATERAL SELECTIVE  02/06/2018  . IR ANGIOGRAM SELECTIVE EACH ADDITIONAL VESSEL  02/06/2018  . IR ANGIOGRAM SELECTIVE EACH ADDITIONAL VESSEL   02/06/2018  . IR INFUSION THROMBOL ARTERIAL INITIAL (MS)  02/06/2018  . IR INFUSION THROMBOL ARTERIAL INITIAL (MS)  02/06/2018  . IR THROMB F/U EVAL ART/VEN FINAL DAY (MS)  02/07/2018  . IR US GUIDE VASC ACCESS RIGHT  02/06/2018  . LOBECTOMY Right 09/02/2016   Procedure: RIGHT UPPER LOBECTOMY;  Surgeon: Melrose Nakayama, MD;  Location: [facility];  Service: Thoracic;  Laterality: Right;  . LYMPH NODE DISSECTION Right 09/02/2016   Procedure: LYMPH NODE DISSECTION, RIGHT LUNG;  Surgeon: Melrose Nakayama, MD;  Location: [facility];  Service: Thoracic;  Laterality: Right;  . MITRAL VALVE REPAIR N/A 03/24/2017   Procedure: MITRAL VALVE REPAIR (MVR) with Sorin Carbomedics Annuloflex size 28;  Surgeon: Rexene Alberts, MD;  Location: [facility];  Service: Open Heart Surgery;  Laterality: N/A;  . RIGHT/LEFT HEART CATH AND CORONARY ANGIOGRAPHY N/A 08/11/2016   Procedure: Right/Left Heart Cath and Coronary Angiography;  Surgeon: Lilla Callejo M Martinique, MD;  [facility]  INVASIVE CV LAB;  Service: Cardiovascular;  Laterality: N/A;  . TEE WITHOUT CARDIOVERSION N/A 07/17/2016   Procedure: TRANSESOPHAGEAL ECHOCARDIOGRAM (TEE);  Surgeon: [name], MD;  Location: [facility] ENDOSCOPY;  Service: Cardiovascular;  Laterality: N/A;  . TEE WITHOUT CARDIOVERSION N/A 03/24/2017   Procedure: TRANSESOPHAGEAL ECHOCARDIOGRAM (TEE);  Surgeon: Rexene Alberts, MD;  Location: [place];  Service: Open Heart Surgery;  Laterality: N/A;  . TONSILLECTOMY  1950's  . TOTAL HIP ARTHROPLASTY Left 10/05/2014   dr [name]  . TOTAL HIP ARTHROPLASTY Left 10/05/2014   Procedure: LEFT TOTAL HIP ARTHROPLASTY ANTERIOR APPROACH;  Surgeon: [name], MD;  Location: [place];  Service: Orthopedics;  Laterality: Left;  Marland Kitchen VIDEO ASSISTED THORACOSCOPY (VATS)/WEDGE RESECTION Right 09/02/2016   Procedure: VIDEO ASSISTED THORACOSCOPY (VATS)/RIGHT UPPER LOBE WEDGE RESECTION;  Surgeon: Melrose Nakayama, MD;  Location: [place];  Service: Thoracic;  Laterality: Right;  Marland Kitchen VIDEO  BRONCHOSCOPY WITH ENDOBRONCHIAL NAVIGATION N/A 10/19/2012   Procedure: VIDEO BRONCHOSCOPY WITH ENDOBRONCHIAL NAVIGATION;  Surgeon: [name], MD;  Location: [place];  Service: Thoracic;  Laterality: N/A;  . WRIST RECONSTRUCTION Left 12/2009   'proximal row carpectomy" [name]    Current Medications: Outpatient Medications Prior to Visit  Medication Sig Dispense Refill  . furosemide (LASIX) 40 MG tablet Take 1 tablet (40 mg total) by mouth as needed for fluid.    . metoprolol tartrate (LOPRESSOR) 25 MG tablet TAKE 1/2 TABLET BY MOUTH 2 TIMES DAILY 90 tablet 3  . Multiple Vitamin (MULTIVITAMIN) tablet Take 1 tablet by mouth daily.    . Omega-3 Fatty Acids (FISH OIL) 500 MG CAPS Take 500 mg by mouth daily.     . potassium chloride SA (K-DUR,KLOR-CON) 20 MEQ tablet Take 1 tablet (20 mEq total) by mouth daily as needed. With lasix 90 tablet 0  . rivaroxaban (XARELTO) 20 MG TABS tablet Take 1 tablet (20 mg total) by mouth daily with supper. 90 tablet 3  . TAGRISSO 80 MG tablet TAKE 1 TABLET (80 MG TOTAL) BY MOUTH DAILY. 30 tablet 2  . traMADol (ULTRAM) 50 MG tablet Take 1 tablet (50 mg total) by mouth every 6 (six) hours as needed (may take one or two tablets every six hrs prn). 30 tablet 0   No facility-administered medications prior to visit.      Allergies:   Symbicort [budesonide-formoterol fumarate] and Amoxicillin   Social History   Socioeconomic History  . Marital status: Married    Spouse name: Not on file  . Number of children: 3  . Years of education: Not on file  . Highest education level: Not on file  Occupational History  . Occupation: Retired, Financial risk analyst  . Occupation: Retired, Real estate    Comment: [employer]  . Financial resource strain: Not on file  . Food insecurity:    Worry: Patient refused    Inability: Patient refused  . Transportation needs:    Medical: Patient refused    Non-medical: Patient refused  Tobacco Use  . Smoking status: Never  Smoker  . Smokeless tobacco: Never Used  Substance and Sexual Activity  . Alcohol use: Yes    Alcohol/week: 1.0 standard drinks    Types: 1 Cans of beer per week    Comment: none since 3 weeks  . Drug use: No  . Sexual activity: Never    Partners: Female  Lifestyle  . Physical activity:    Days per week: Patient refused    Minutes per session: Patient  refused  . Stress: Not on file  Relationships  . Social connections:    Talks on phone: Not on file    Gets together: Not on file    Attends religious service: Not on file    Active member of club or organization: Not on file    Attends meetings of clubs or organizations: Not on file    Relationship status: Not on file  Other Topics Concern  . Not on file  Social History Narrative   HSG, [employer]. Married '73.  2 sons - '74,   '76;  1 daughter -  '77  6 grandchildren.   Work - IT consultant now retired. ACP - not fully discussed.                     Family History:  The patient's family history includes Heart attack in his father; Heart disease in his father and paternal uncle; Heart failure in his mother.   ROS:   Please see the history of present illness.    ROS All other systems reviewed and are negative.   PHYSICAL EXAM:   VS:  BP 130/90   Pulse 66   Ht 5\' 9"  (1.753 m)   Wt 228 lb (103.4 kg)   BMI 33.67 kg/m    GENERAL:  Well appearing WM in NAD HEENT:  PERRL, EOMI, sclera are clear. Oropharynx is clear. NECK:  No jugular venous distention, carotid upstroke brisk and symmetric, no bruits, no thyromegaly or adenopathy LUNGS:  Clear to auscultation bilaterally CHEST:  Unremarkable HEART:  RRR,  PMI not displaced or sustained,S1 and S2 within normal limits, no S3, no S4: no clicks, no rubs, gr 2/6 systolic murmur LSB ABD:  Soft, nontender. BS +, no masses or bruits. No hepatomegaly, no splenomegaly EXT:  2 + pulses throughout, 1+  edema, no cyanosis no clubbing SKIN:  Warm and dry.   No rashes NEURO:  Alert and oriented x 3. Cranial nerves II through XII intact. PSYCH:  Cognitively intact      Wt Readings from Last 3 Encounters:  06/09/18 228 lb (103.4 kg)  05/16/18 225 lb (102.1 kg)  04/11/18 222 lb 1.6 oz (100.7 kg)      Studies/Labs Reviewed:   EKG:  EKG is not ordered today.   Recent Labs: 07/28/2017: Pro B Natriuretic peptide (BNP) 48.0; TSH 2.56 02/06/2018: B Natriuretic Peptide 120.7 02/07/2018: Magnesium 2.1 05/13/2018: ALT 40; BUN 21; Creatinine 1.43; Hemoglobin 15.5; Platelet Count 141; Potassium 4.5; Sodium 142   Lipid Panel    Component Value Date/Time   CHOL 177 05/27/2017 0807   TRIG 128 05/27/2017 0807   TRIG 108 05/20/2006 0825   HDL 41 05/27/2017 0807   CHOLHDL 4.3 05/27/2017 0807   CHOLHDL 7 06/29/2016 1153   VLDL 39.6 06/29/2016 1153   LDLCALC 110 (H) 05/27/2017 0807   LDLDIRECT 177.0 07/24/2015 1052    Additional studies/ records that were reviewed today include:    Cath 08/11/2016 Conclusion     Prox LAD to Mid LAD lesion, 30 %stenosed.  Mid LAD to Dist LAD lesion, 65 %stenosed.  Prox RCA to Mid RCA lesion, 15 %stenosed.  The left ventricular systolic function is normal.  LV end diastolic pressure is normal.  The left ventricular ejection fraction is 55-65% by visual estimate.  LV end diastolic pressure is normal.   1. Borderline single vessel obstructive CAD involving the mid LAD. FFR 0.81 2. Normal LV function EF 65%  3. Normal right heart and LV filling pressures 4. Normal Cardiac output  Plan: will refer to CT surgery for consideration of MV repair. The stenosis in the LAD will be discussed. He is asymptomatic and I would favor treating it medically. If he develops symptoms in the future it could be treated with PCI.        Echo 02/15/2017 LV EF: 60% -   65%  Study Conclusions  - Left ventricle: The cavity size was normal. Wall thickness was   normal. Systolic function was normal. The estimated  ejection   fraction was in the range of 60% to 65%. - Aortic valve: AV is thickened, calcified with minimally   restricted motion. - Mitral valve: Prolapse fo the posterior mitral leaflet. MR is at   least moderate in intensity Calcified annulus. Mildly thickened   leaflets . - Left atrium: The atrium was mildly dilated.    CABG with MVR 03/24/2017 Preoperative Diagnosis:        Severe Mitral Regurgitation  Single-vessel Coronary Artery Disease  Postoperative Diagnosis:    Same  Procedure:        Mitral Valve Repair             Complex valvuloplasty including artificial Gore-tex neochord placement x6             Sorin Carbomedics Annuloflex posterior annuloplasty band (size 16mm, catalog #[id number], serial #[id number])   Coronary Artery Bypass Grafting x 1              Left Internal Mammary Artery to Distal Left Anterior Descending Coronary Artery   Echo 06/10/17: Study Conclusions  - Left ventricle: The cavity size was normal. There was moderate   concentric hypertrophy. Systolic function was vigorous. The   estimated ejection fraction was in the range of 65% to 70%. Wall   motion was normal; there were no regional wall motion   abnormalities. Doppler parameters are consistent with abnormal   left ventricular relaxation (grade 1 diastolic dysfunction). - Aortic valve: Trileaflet; mildly thickened, mildly calcified   leaflets. Transvalvular velocity was minimally increased. There   was no stenosis. There was no regurgitation. - Mitral valve: S/P mitral valve repair with fixed posterior   leaflet. Transvalvular velocity was elevated. There was no   regurgitation. - Right ventricle: The cavity size was normal. Wall thickness was   normal. Systolic function was normal. - Tricuspid valve: There was no regurgitation. - Pericardium, extracardiac: A mild pericardial effusion was   identified posterior to the heart. Features were not consistent   with tamponade  physiology.  Impressions:  - Since the last study on 02/15/2017 mitral valve is post repair.   Transmitral velocities are elevated with mean gradient 8 mmHg.   LVEF is hyperdynamic.  Echo 02/07/18: Study Conclusions  - Left ventricle: The cavity size was mildly reduced. Wall   thickness was increased in a pattern of mild LVH. Systolic   function was vigorous. The estimated ejection fraction was in the   range of 65% to 70%. - Mitral valve: s/p MV repair. Motion is difficult to see due to   poor acoustic windows Peak and mean gradiaeants through the valve   are 7 and 4 mm Hg respectively. MVA by P T1/2 is 1.75 cm2   consistent with mild MS - Right ventricle: RV is difficult to visualize, even with Definity   OVerall RVEF appears mildly reduced. The cavity size was mildly   dilated. - Right atrium: The atrium was  mildly dilated.  ASSESSMENT:    1. S/P CABG x 1   2. S/P mitral valve repair   3. Other pulmonary embolism without acute cor pulmonale, unspecified chronicity ([place])   4. Chronic diastolic congestive heart failure ([place])      PLAN:  In order of problems listed above:  1. Severe MR s/p mitral valve annuloplasty:  Echo shows an excellent repair.  Recommend routine SBE prophylaxis.    2.   History of PE now recurrent on 02/06/18. Submassive with RV strain. S/p EKOS and lytic therapy. On Xarelto now.  Will need anticoagulation indefinitely.   3.    Chronic diastolic heart failure: weight is stable.  He does have mild  edema. Continue sodium restriction and support hose.  4.    S/p CABG x 1: LIMA to LAD  5.    Stage IV lung CA. S/p resection with recurrent malignant pleural effusion. On oral chemostherapy. Scans in January show no progression.  6.   DM 2: Managed by primary care provider  7.   HLD off statin due to elevated CK. To have follow up lab work in March with Dr Ronnald Ramp. If CK still elevated should stay away from statins. Could consider Zetia or PCSK 9  inhibitor.   Medication Adjustments/Labs and Tests Ordered: Current medicines are reviewed at length with the patient today.  Concerns regarding medicines are outlined above.  Medication changes, Labs and Tests ordered today are listed in the Patient Instructions below. There are no Patient Instructions on file for this visit.   Signed, Zach Tietje Martinique, MD  06/09/2018 11:48 AM    [facility] [place], [place], [place]  [address] Phone: [phone]; Fax: [phone]

## 2018-06-09 ENCOUNTER — Ambulatory Visit: Payer: Medicare Other | Admitting: Cardiology

## 2018-06-09 ENCOUNTER — Encounter: Payer: Self-pay | Admitting: Cardiology

## 2018-06-09 VITALS — BP 130/90 | HR 66 | Ht 69.0 in | Wt 228.0 lb

## 2018-06-09 DIAGNOSIS — I2699 Other pulmonary embolism without acute cor pulmonale: Secondary | ICD-10-CM

## 2018-06-09 DIAGNOSIS — Z9889 Other specified postprocedural states: Secondary | ICD-10-CM

## 2018-06-09 DIAGNOSIS — I5032 Chronic diastolic (congestive) heart failure: Secondary | ICD-10-CM

## 2018-06-09 DIAGNOSIS — Z951 Presence of aortocoronary bypass graft: Secondary | ICD-10-CM

## 2018-06-13 ENCOUNTER — Inpatient Hospital Stay (HOSPITAL_BASED_OUTPATIENT_CLINIC_OR_DEPARTMENT_OTHER): Payer: Medicare Other | Admitting: Internal Medicine

## 2018-06-13 ENCOUNTER — Inpatient Hospital Stay: Payer: Medicare Other | Attending: Internal Medicine

## 2018-06-13 ENCOUNTER — Encounter: Payer: Self-pay | Admitting: Internal Medicine

## 2018-06-13 ENCOUNTER — Telehealth: Payer: Self-pay | Admitting: Internal Medicine

## 2018-06-13 VITALS — BP 125/76 | HR 59 | Temp 98.7°F | Resp 18 | Ht 69.0 in | Wt 224.5 lb

## 2018-06-13 DIAGNOSIS — E119 Type 2 diabetes mellitus without complications: Secondary | ICD-10-CM | POA: Diagnosis not present

## 2018-06-13 DIAGNOSIS — R918 Other nonspecific abnormal finding of lung field: Secondary | ICD-10-CM | POA: Diagnosis not present

## 2018-06-13 DIAGNOSIS — Z951 Presence of aortocoronary bypass graft: Secondary | ICD-10-CM | POA: Insufficient documentation

## 2018-06-13 DIAGNOSIS — Z902 Acquired absence of lung [part of]: Secondary | ICD-10-CM | POA: Insufficient documentation

## 2018-06-13 DIAGNOSIS — J90 Pleural effusion, not elsewhere classified: Secondary | ICD-10-CM | POA: Diagnosis not present

## 2018-06-13 DIAGNOSIS — Z9221 Personal history of antineoplastic chemotherapy: Secondary | ICD-10-CM | POA: Diagnosis not present

## 2018-06-13 DIAGNOSIS — Z7901 Long term (current) use of anticoagulants: Secondary | ICD-10-CM | POA: Diagnosis not present

## 2018-06-13 DIAGNOSIS — C3411 Malignant neoplasm of upper lobe, right bronchus or lung: Secondary | ICD-10-CM

## 2018-06-13 DIAGNOSIS — C3491 Malignant neoplasm of unspecified part of right bronchus or lung: Secondary | ICD-10-CM

## 2018-06-13 DIAGNOSIS — I10 Essential (primary) hypertension: Secondary | ICD-10-CM | POA: Diagnosis not present

## 2018-06-13 DIAGNOSIS — Z79899 Other long term (current) drug therapy: Secondary | ICD-10-CM | POA: Insufficient documentation

## 2018-06-13 DIAGNOSIS — Z5111 Encounter for antineoplastic chemotherapy: Secondary | ICD-10-CM

## 2018-06-13 LAB — CMP (CANCER CENTER ONLY)
ALT: 28 U/L (ref 0–44)
AST: 26 U/L (ref 15–41)
Albumin: 3.9 g/dL (ref 3.5–5.0)
Alkaline Phosphatase: 52 U/L (ref 38–126)
Anion gap: 6 (ref 5–15)
BUN: 21 mg/dL (ref 8–23)
CO2: 27 mmol/L (ref 22–32)
Calcium: 9.2 mg/dL (ref 8.9–10.3)
Chloride: 109 mmol/L (ref 98–111)
Creatinine: 1.28 mg/dL — ABNORMAL HIGH (ref 0.61–1.24)
GFR, Est AFR Am: >60 mL/min (ref >60)
GFR, Estimated: 57 mL/min — ABNORMAL LOW (ref >60)
Glucose, Bld: 98 mg/dL (ref 70–99)
Potassium: 4.9 mmol/L (ref 3.5–5.1)
Sodium: 142 mmol/L (ref 135–145)
Total Bilirubin: 0.4 mg/dL (ref 0.3–1.2)
Total Protein: 6.9 g/dL (ref 6.5–8.1)

## 2018-06-13 LAB — CBC WITH DIFFERENTIAL (CANCER CENTER ONLY)
Abs Immature Granulocytes: 0.03 10*3/uL (ref 0.00–0.07)
Basophils Absolute: 0 10*3/uL (ref 0.0–0.1)
Basophils Relative: 1 %
Eosinophils Absolute: 0.1 10*3/uL (ref 0.0–0.5)
Eosinophils Relative: 2 %
HCT: 47.5 % (ref 39.0–52.0)
Hemoglobin: 15.4 g/dL (ref 13.0–17.0)
Immature Granulocytes: 1 %
Lymphocytes Relative: 25 %
Lymphs Abs: 1.3 10*3/uL (ref 0.7–4.0)
MCH: 28.7 pg (ref 26.0–34.0)
MCHC: 32.4 g/dL (ref 30.0–36.0)
MCV: 88.6 fL (ref 80.0–100.0)
Monocytes Absolute: 0.7 10*3/uL (ref 0.1–1.0)
Monocytes Relative: 13 %
Neutro Abs: 3.2 10*3/uL (ref 1.7–7.7)
Neutrophils Relative %: 58 %
Platelet Count: 131 10*3/uL — ABNORMAL LOW (ref 150–400)
RBC: 5.36 MIL/uL (ref 4.22–5.81)
RDW: 16.1 % — ABNORMAL HIGH (ref 11.5–15.5)
WBC Count: 5.3 10*3/uL (ref 4.0–10.5)
nRBC: 0 % (ref 0.0–0.2)

## 2018-06-13 NOTE — Progress Notes (Signed)
[facility] Telephone:(336) [phone]   Fax:(336) [phone]  OFFICE PROGRESS NOTE  [name], MD [address] [place] [address]  DIAGNOSIS: Recurrent non-small cell lung cancer, adenocarcinoma initially diagnosed as stage IA (T1c, N0, M0) non-small cell lung cancer, adenocarcinoma presented with right upper lobe lung nodule in April 2018 with recurrence in June 2019.  Biomarker Findings Microsatellite status - MS-Stable Tumor Mutational Burden - TMB-Low (3 Muts/Mb) Genomic Findings For a complete list of the genes assayed, please refer to the Appendix. EGFR exon 19 deletion ([id number]>N) CDKN2A/B loss NKX2-1 amplification 7 Disease relevant genes with no reportable alterations: KRAS, ALK, BRAF, MET, RET, ERBB2, ROS1   PRIOR THERAPY: Status post right upper lobectomy with lymph node dissection under the care of Dr. [name] on 09/02/2016.  CURRENT THERAPY: Tagrisso 80 mg p.o. daily.  First dose started November 05, 2017.  Status post 6 months of treatment.  INTERVAL HISTORY: James [name] 68 y.o. male returns to the clinic today for follow-up visit accompanied by his wife.  The patient is feeling fine today with no concerning complaints except for dry eyes.  He denied having any chest pain, shortness of breath, cough or hemoptysis.  He denied having any fever or chills.  He has no nausea, vomiting, diarrhea or constipation.  The patient denied having any headache or visual changes.  He is tolerating his treatment with Tagrisso fairly well.  He is here today for evaluation and repeat blood work.  MEDICAL HISTORY: Past Medical History:  Diagnosis Date  . Adenocarcinoma of right lung, stage 1 ([place]) 09/06/2016  . Anxiety   . Arthritis    "knees, hips, back" (10/19/2012)  . Chronic diastolic congestive heart failure ([place])   . Chronic lower back pain   . Colon polyps    10/27/2002, repeat letter 09/17/2007  . Coronary artery disease   . Coronary  artery disease involving native coronary artery of native heart without angina pectoris   . Depressive disorder, not elsewhere classified    no meds  . Diabetes mellitus without complication ([place])    diet controlled- no med  (while in hosp 4/18 -elevated cbg  . Dyspnea   . Fasting hyperglycemia   . GERD (gastroesophageal reflux disease)   . Heart murmur   . Hemoptysis    abnormal CT Chest 01/29/10 - ? new GG changes RUL > not viz on plain cxr 02/26/2010  . Hypertension   . Mitral regurgitation    severe MR 08/2016  . MPN (myeloproliferative neoplasm) ([place])    1st detected 06/04/1998  . Obesity   . OSA on CPAP    last sleep study 10 years ago  . Other and unspecified hyperlipidemia   . Peripheral vascular disease ([place]) 08/2016   after lung surgey small clots in lungs,after hip dvt-5/16  . Pneumonia    4/18  . Positive PPD 1965   "non reactive in 2012" (10/19/2012)  . Routine general medical examination at a health care facility   . S/P CABG x 1 03/24/2017   LIMA to LAD  . S/P mitral valve repair 03/24/2017   Complex valvuloplasty including artificial Gore-tex neochord placement x6 and 28 mm Sorin Annuloflex posterior annuloplasty band  . Special screening for malignant neoplasm of prostate   . Spinal stenosis, unspecified region other than cervical   . Wrist pain, left     ALLERGIES:  is allergic to symbicort [budesonide-formoterol fumarate] and amoxicillin.  MEDICATIONS:  Current Outpatient Medications  Medication Sig Dispense Refill  . metoprolol tartrate (LOPRESSOR) 25 MG tablet TAKE 1/2 TABLET BY MOUTH 2 TIMES DAILY 90 tablet 3  . Multiple Vitamin (MULTIVITAMIN) tablet Take 1 tablet by mouth daily.    . Omega-3 Fatty Acids (FISH OIL) 500 MG CAPS Take 500 mg by mouth daily.     . rivaroxaban (XARELTO) 20 MG TABS tablet Take 1 tablet (20 mg total) by mouth daily with supper. 90 tablet 3  . TAGRISSO 80 MG tablet TAKE 1 TABLET (80 MG TOTAL) BY MOUTH DAILY. 30 tablet 2  .  furosemide (LASIX) 40 MG tablet Take 1 tablet (40 mg total) by mouth as needed for fluid. (Patient not taking: Reported on 06/13/2018)    . potassium chloride SA (K-DUR,KLOR-CON) 20 MEQ tablet Take 1 tablet (20 mEq total) by mouth daily as needed. With lasix (Patient not taking: Reported on 06/13/2018) 90 tablet 0  . traMADol (ULTRAM) 50 MG tablet Take 1 tablet (50 mg total) by mouth every 6 (six) hours as needed (may take one or two tablets every six hrs prn). (Patient not taking: Reported on 06/13/2018) 30 tablet 0   No current facility-administered medications for this visit.     SURGICAL HISTORY:  Past Surgical History:  Procedure Laterality Date  . ANTERIOR CRUCIATE LIGAMENT REPAIR Left 1967  . CARDIAC CATHETERIZATION  2000  . CHEST TUBE INSERTION Right 10/19/2012   post bronch  . COLONOSCOPY W/ POLYPECTOMY    . CORONARY ARTERY BYPASS GRAFT N/A 03/24/2017   Procedure: CORONARY ARTERY BYPASS GRAFTING (CABG)x1 using left internal mammary artery, LIMA-LAD;  Surgeon: Rexene Alberts, MD;  Location: [facility];  Service: Open Heart Surgery;  Laterality: N/A;  . FLEXIBLE BRONCHOSCOPY  10/19/2012   Flexible video fiberoptic bronchoscopy with electromagnetic navigation and biopsies.  . INTRAVASCULAR PRESSURE WIRE/FFR STUDY N/A 08/11/2016   Procedure: Intravascular Pressure Wire/FFR Study;  Surgeon: [name], MD;  Location: [facility];  Service: Cardiovascular;  Laterality: N/A;  . IR ANGIOGRAM PULMONARY BILATERAL SELECTIVE  02/06/2018  . IR ANGIOGRAM SELECTIVE EACH ADDITIONAL VESSEL  02/06/2018  . IR ANGIOGRAM SELECTIVE EACH ADDITIONAL VESSEL  02/06/2018  . IR INFUSION THROMBOL ARTERIAL INITIAL (MS)  02/06/2018  . IR INFUSION THROMBOL ARTERIAL INITIAL (MS)  02/06/2018  . IR THROMB F/U EVAL ART/VEN FINAL DAY (MS)  02/07/2018  . IR US GUIDE VASC ACCESS RIGHT  02/06/2018  . LOBECTOMY Right 09/02/2016   Procedure: RIGHT UPPER LOBECTOMY;  Surgeon: Melrose Nakayama, MD;  Location: [facility];  Service:  Thoracic;  Laterality: Right;  . LYMPH NODE DISSECTION Right 09/02/2016   Procedure: LYMPH NODE DISSECTION, RIGHT LUNG;  Surgeon: Melrose Nakayama, MD;  Location: [facility];  Service: Thoracic;  Laterality: Right;  . MITRAL VALVE REPAIR N/A 03/24/2017   Procedure: MITRAL VALVE REPAIR (MVR) with Sorin Carbomedics Annuloflex size 28;  Surgeon: Rexene Alberts, MD;  Location: [facility];  Service: Open Heart Surgery;  Laterality: N/A;  . RIGHT/LEFT HEART CATH AND CORONARY ANGIOGRAPHY N/A 08/11/2016   Procedure: Right/Left Heart Cath and Coronary Angiography;  Surgeon: [name], MD;  Location: [facility];  Service: Cardiovascular;  Laterality: N/A;  . TEE WITHOUT CARDIOVERSION N/A 07/17/2016   Procedure: TRANSESOPHAGEAL ECHOCARDIOGRAM (TEE);  Surgeon: [name], MD;  [facility];  Service: Cardiovascular;  Laterality: N/A;  . TEE WITHOUT CARDIOVERSION N/A 03/24/2017   Procedure: TRANSESOPHAGEAL ECHOCARDIOGRAM (TEE);  Surgeon: Rexene Alberts, MD;  Location: [facility]  OR;  Service: Open Heart Surgery;  Laterality: N/A;  . TONSILLECTOMY  1950's  . TOTAL HIP ARTHROPLASTY Left 10/05/2014   dr [name]  . TOTAL HIP ARTHROPLASTY Left 10/05/2014   Procedure: LEFT TOTAL HIP ARTHROPLASTY ANTERIOR APPROACH;  Surgeon: [name], MD;  Location: [facility];  Service: Orthopedics;  Laterality: Left;  Marland Kitchen VIDEO ASSISTED THORACOSCOPY (VATS)/WEDGE RESECTION Right 09/02/2016   Procedure: VIDEO ASSISTED THORACOSCOPY (VATS)/RIGHT UPPER LOBE WEDGE RESECTION;  Surgeon: Melrose Nakayama, MD;  Location: [facility];  Service: Thoracic;  Laterality: Right;  Marland Kitchen VIDEO BRONCHOSCOPY WITH ENDOBRONCHIAL NAVIGATION N/A 10/19/2012   Procedure: VIDEO BRONCHOSCOPY WITH ENDOBRONCHIAL NAVIGATION;  Surgeon: [name], MD;  Location: [facility];  Service: Thoracic;  Laterality: N/A;  . WRIST RECONSTRUCTION Left 12/2009   'proximal row carpectomy" [name]    REVIEW OF SYSTEMS:  A comprehensive review of systems was negative except  for: Eyes: positive for irritation   PHYSICAL EXAMINATION: General appearance: alert, cooperative and no distress Head: Normocephalic, without obvious abnormality, atraumatic Neck: no adenopathy, no JVD, supple, symmetrical, trachea midline and thyroid not enlarged, symmetric, no tenderness/mass/nodules Lymph nodes: Cervical, supraclavicular, and axillary nodes normal. Resp: clear to auscultation bilaterally Back: symmetric, no curvature. ROM normal. No CVA tenderness. Cardio: regular rate and rhythm, S1, S2 normal, no murmur, click, rub or gallop GI: soft, non-tender; bowel sounds normal; no masses,  no organomegaly Extremities: extremities normal, atraumatic, no cyanosis or edema  ECOG PERFORMANCE STATUS: 1 - Symptomatic but completely ambulatory  Blood pressure 125/76, pulse (!) 59, temperature 98.7 F (37.1 C), temperature source Oral, resp. rate 18, height 5' 9"  (1.753 m), weight 224 lb 8 oz (101.8 kg), SpO2 97 %.  LABORATORY DATA: Lab Results  Component Value Date   WBC 5.3 06/13/2018   HGB 15.4 06/13/2018   HCT 47.5 06/13/2018   MCV 88.6 06/13/2018   PLT 131 (L) 06/13/2018      Chemistry      Component Value Date/Time   NA 142 05/13/2018 1230   NA 142 10/01/2016 1534   K 4.5 05/13/2018 1230   K 4.2 10/01/2016 1534   CL 107 05/13/2018 1230   CO2 25 05/13/2018 1230   CO2 28 10/01/2016 1534   BUN 21 05/13/2018 1230   BUN 15.9 10/01/2016 1534   CREATININE 1.43 (H) 05/13/2018 1230   CREATININE 1.10 02/08/2017 1101   CREATININE 1.5 (H) 10/01/2016 1534      Component Value Date/Time   CALCIUM 9.5 05/13/2018 1230   CALCIUM 9.6 10/01/2016 1534   ALKPHOS 56 05/13/2018 1230   ALKPHOS 65 10/01/2016 1534   AST 32 05/13/2018 1230   AST 20 10/01/2016 1534   ALT 40 05/13/2018 1230   ALT 24 10/01/2016 1534   BILITOT 0.5 05/13/2018 1230   BILITOT 0.46 10/01/2016 1534       RADIOGRAPHIC STUDIES: No results found.  ASSESSMENT AND PLAN: This is a very pleasant 68 years  old white male with a stage Ia non-small cell lung cancer status post right upper lobectomy with lymph node dissection on September 02, 2016. The patient he was on observation since that time. Unfortunately there are some concerning findings with nodular density in the posterior right lower lobe as well as increase and right pleural effusion suspicious for disease recurrence. He underwent ultrasound-guided right thoracentesis and the cytology of the pleural fluid was consistent with recurrent adenocarcinoma. He had molecular studies performed by [facility] 1 that showed positive EGFR mutation with deletion 36. The patient was started  on treatment with Tagrisso 80 mg p.o. daily on 11/05/2017.  He status post 7 months of treatment. He has been tolerating this treatment well with no concerning adverse effects. I recommended for the patient to continue his current treatment with Tagrisso with the same dose. I will see the patient back for follow-up visit in 1 months for evaluation with repeat blood work. The patient was advised to call immediately if he has any concerning symptoms in the interval. The patient voices understanding of current disease status and treatment options and is in agreement with the current care plan. All questions were answered. The patient knows to call the clinic with any problems, questions or concerns. We can certainly see the patient much sooner if necessary.  Disclaimer: This note was dictated with voice recognition software. Similar sounding words can inadvertently be transcribed and may not be corrected upon review.

## 2018-06-13 NOTE — Telephone Encounter (Signed)
Scheduled appt per 02/03 los.  Printed calendar and avs.

## 2018-06-23 MED FILL — TAGRISSO 80 MG TABLET: 80 | 30 days supply | Qty: 30 | Fill #2

## 2018-07-14 ENCOUNTER — Encounter: Payer: Self-pay | Admitting: Internal Medicine

## 2018-07-14 ENCOUNTER — Telehealth: Payer: Self-pay | Admitting: Internal Medicine

## 2018-07-14 ENCOUNTER — Inpatient Hospital Stay (HOSPITAL_BASED_OUTPATIENT_CLINIC_OR_DEPARTMENT_OTHER): Payer: Medicare Other | Admitting: Internal Medicine

## 2018-07-14 ENCOUNTER — Inpatient Hospital Stay: Payer: Medicare Other | Attending: Internal Medicine

## 2018-07-14 VITALS — BP 136/66 | HR 64 | Temp 98.3°F | Resp 18 | Ht 69.0 in | Wt 225.9 lb

## 2018-07-14 DIAGNOSIS — C3490 Malignant neoplasm of unspecified part of unspecified bronchus or lung: Secondary | ICD-10-CM | POA: Diagnosis not present

## 2018-07-14 DIAGNOSIS — C3491 Malignant neoplasm of unspecified part of right bronchus or lung: Secondary | ICD-10-CM

## 2018-07-14 DIAGNOSIS — J90 Pleural effusion, not elsewhere classified: Secondary | ICD-10-CM | POA: Insufficient documentation

## 2018-07-14 DIAGNOSIS — I10 Essential (primary) hypertension: Secondary | ICD-10-CM

## 2018-07-14 DIAGNOSIS — E119 Type 2 diabetes mellitus without complications: Secondary | ICD-10-CM

## 2018-07-14 DIAGNOSIS — Z5111 Encounter for antineoplastic chemotherapy: Secondary | ICD-10-CM

## 2018-07-14 LAB — CBC WITH DIFFERENTIAL (CANCER CENTER ONLY)
Abs Immature Granulocytes: 0.04 10*3/uL (ref 0.00–0.07)
Basophils Absolute: 0 10*3/uL (ref 0.0–0.1)
Basophils Relative: 1 %
Eosinophils Absolute: 0.1 10*3/uL (ref 0.0–0.5)
Eosinophils Relative: 1 %
HCT: 47.1 % (ref 39.0–52.0)
Hemoglobin: 15.5 g/dL (ref 13.0–17.0)
Immature Granulocytes: 1 %
Lymphocytes Relative: 16 %
Lymphs Abs: 1.1 10*3/uL (ref 0.7–4.0)
MCH: 29.5 pg (ref 26.0–34.0)
MCHC: 32.9 g/dL (ref 30.0–36.0)
MCV: 89.5 fL (ref 80.0–100.0)
Monocytes Absolute: 0.7 10*3/uL (ref 0.1–1.0)
Monocytes Relative: 10 %
Neutro Abs: 4.9 10*3/uL (ref 1.7–7.7)
Neutrophils Relative %: 71 %
Platelet Count: 136 10*3/uL — ABNORMAL LOW (ref 150–400)
RBC: 5.26 MIL/uL (ref 4.22–5.81)
RDW: 15.7 % — ABNORMAL HIGH (ref 11.5–15.5)
WBC Count: 6.9 10*3/uL (ref 4.0–10.5)
nRBC: 0 % (ref 0.0–0.2)

## 2018-07-14 LAB — CMP (CANCER CENTER ONLY)
ALT: 24 U/L (ref 0–44)
AST: 24 U/L (ref 15–41)
Albumin: 4 g/dL (ref 3.5–5.0)
Alkaline Phosphatase: 50 U/L (ref 38–126)
Anion gap: 9 (ref 5–15)
BUN: 17 mg/dL (ref 8–23)
CO2: 24 mmol/L (ref 22–32)
Calcium: 9.2 mg/dL (ref 8.9–10.3)
Chloride: 108 mmol/L (ref 98–111)
Creatinine: 1.36 mg/dL — ABNORMAL HIGH (ref 0.61–1.24)
GFR, Est AFR Am: >60 mL/min (ref >60)
GFR, Estimated: 53 mL/min — ABNORMAL LOW (ref >60)
Glucose, Bld: 107 mg/dL — ABNORMAL HIGH (ref 70–99)
Potassium: 4.9 mmol/L (ref 3.5–5.1)
Sodium: 141 mmol/L (ref 135–145)
Total Bilirubin: 0.7 mg/dL (ref 0.3–1.2)
Total Protein: 7.2 g/dL (ref 6.5–8.1)

## 2018-07-14 NOTE — Progress Notes (Signed)
[facility] Telephone:(336) [phone]   Fax:(336) [phone]  OFFICE PROGRESS NOTE  [name], MD [address] [place] [address]  DIAGNOSIS: Recurrent non-small cell lung cancer, adenocarcinoma initially diagnosed as stage IA (T1c, N0, M0) non-small cell lung cancer, adenocarcinoma presented with right upper lobe lung nodule in April 2018 with recurrence in June 2019.  Biomarker Findings Microsatellite status - MS-Stable Tumor Mutational Burden - TMB-Low (3 Muts/Mb) Genomic Findings For a complete list of the genes assayed, please refer to the Appendix. EGFR exon 19 deletion ([id number]>F) CDKN2A/B loss NKX2-1 amplification 7 Disease relevant genes with no reportable alterations: KRAS, ALK, BRAF, MET, RET, ERBB2, ROS1   PRIOR THERAPY: Status post right upper lobectomy with lymph node dissection under the care of Dr. [name] on 09/02/2016.  CURRENT THERAPY: Tagrisso 80 mg p.o. daily.  First dose started November 05, 2017.  Status post 7 months of treatment.  INTERVAL HISTORY: James [name] 68 y.o. male returns to the clinic today for follow-up visit accompanied by his wife.  The patient is feeling fine today with no concerning complaints except for mild fatigue.  He has mild sore throat and recovering from flulike symptoms.  He denied having any chest pain, shortness of breath, cough or hemoptysis.  He has no nausea, vomiting, diarrhea or constipation.  He has no skin rash.  He continues to tolerate his treatment with Tagrisso fairly well.  He is here for evaluation and repeat blood work.  MEDICAL HISTORY: Past Medical History:  Diagnosis Date  . Adenocarcinoma of right lung, stage 1 ([place]) 09/06/2016  . Anxiety   . Arthritis    "knees, hips, back" (10/19/2012)  . Chronic diastolic congestive heart failure ([place])   . Chronic lower back pain   . Colon polyps    10/27/2002, repeat letter 09/17/2007  . Coronary artery disease   . Coronary artery  disease involving native coronary artery of native heart without angina pectoris   . Depressive disorder, not elsewhere classified    no meds  . Diabetes mellitus without complication ([place])    diet controlled- no med  (while in hosp 4/18 -elevated cbg  . Dyspnea   . Fasting hyperglycemia   . GERD (gastroesophageal reflux disease)   . Heart murmur   . Hemoptysis    abnormal CT Chest 01/29/10 - ? new GG changes RUL > not viz on plain cxr 02/26/2010  . Hypertension   . Mitral regurgitation    severe MR 08/2016  . MPN (myeloproliferative neoplasm) ([place])    1st detected 06/04/1998  . Obesity   . OSA on CPAP    last sleep study 10 years ago  . Other and unspecified hyperlipidemia   . Peripheral vascular disease ([place]) 08/2016   after lung surgey small clots in lungs,after hip dvt-5/16  . Pneumonia    4/18  . Positive PPD 1965   "non reactive in 2012" (10/19/2012)  . Routine general medical examination at a health care facility   . S/P CABG x 1 03/24/2017   LIMA to LAD  . S/P mitral valve repair 03/24/2017   Complex valvuloplasty including artificial Gore-tex neochord placement x6 and 28 mm Sorin Annuloflex posterior annuloplasty band  . Special screening for malignant neoplasm of prostate   . Spinal stenosis, unspecified region other than cervical   . Wrist pain, left     ALLERGIES:  is allergic to symbicort [budesonide-formoterol fumarate] and amoxicillin.  MEDICATIONS:  Current  Outpatient Medications  Medication Sig Dispense Refill  . furosemide (LASIX) 40 MG tablet Take 1 tablet (40 mg total) by mouth as needed for fluid. (Patient not taking: Reported on 06/13/2018)    . metoprolol tartrate (LOPRESSOR) 25 MG tablet TAKE 1/2 TABLET BY MOUTH 2 TIMES DAILY 90 tablet 3  . Multiple Vitamin (MULTIVITAMIN) tablet Take 1 tablet by mouth daily.    . Omega-3 Fatty Acids (FISH OIL) 500 MG CAPS Take 500 mg by mouth daily.     . potassium chloride SA (K-DUR,KLOR-CON) 20 MEQ tablet Take 1 tablet  (20 mEq total) by mouth daily as needed. With lasix (Patient not taking: Reported on 06/13/2018) 90 tablet 0  . rivaroxaban (XARELTO) 20 MG TABS tablet Take 1 tablet (20 mg total) by mouth daily with supper. 90 tablet 3  . TAGRISSO 80 MG tablet TAKE 1 TABLET (80 MG TOTAL) BY MOUTH DAILY. 30 tablet 2  . traMADol (ULTRAM) 50 MG tablet Take 1 tablet (50 mg total) by mouth every 6 (six) hours as needed (may take one or two tablets every six hrs prn). (Patient not taking: Reported on 06/13/2018) 30 tablet 0   No current facility-administered medications for this visit.     SURGICAL HISTORY:  Past Surgical History:  Procedure Laterality Date  . ANTERIOR CRUCIATE LIGAMENT REPAIR Left 1967  . CARDIAC CATHETERIZATION  2000  . CHEST TUBE INSERTION Right 10/19/2012   post bronch  . COLONOSCOPY W/ POLYPECTOMY    . CORONARY ARTERY BYPASS GRAFT N/A 03/24/2017   Procedure: CORONARY ARTERY BYPASS GRAFTING (CABG)x1 using left internal mammary artery, LIMA-LAD;  Surgeon: Rexene Alberts, MD;  Location: [facility];  Service: Open Heart Surgery;  Laterality: N/A;  . FLEXIBLE BRONCHOSCOPY  10/19/2012   Flexible video fiberoptic bronchoscopy with electromagnetic navigation and biopsies.  . INTRAVASCULAR PRESSURE WIRE/FFR STUDY N/A 08/11/2016   Procedure: Intravascular Pressure Wire/FFR Study;  Surgeon: [name], MD;  Location: [facility];  Service: Cardiovascular;  Laterality: N/A;  . IR ANGIOGRAM PULMONARY BILATERAL SELECTIVE  02/06/2018  . IR ANGIOGRAM SELECTIVE EACH ADDITIONAL VESSEL  02/06/2018  . IR ANGIOGRAM SELECTIVE EACH ADDITIONAL VESSEL  02/06/2018  . IR INFUSION THROMBOL ARTERIAL INITIAL (MS)  02/06/2018  . IR INFUSION THROMBOL ARTERIAL INITIAL (MS)  02/06/2018  . IR THROMB F/U EVAL ART/VEN FINAL DAY (MS)  02/07/2018  . IR US GUIDE VASC ACCESS RIGHT  02/06/2018  . LOBECTOMY Right 09/02/2016   Procedure: RIGHT UPPER LOBECTOMY;  Surgeon: Melrose Nakayama, MD;  Location: [facility];  Service: Thoracic;   Laterality: Right;  . LYMPH NODE DISSECTION Right 09/02/2016   Procedure: LYMPH NODE DISSECTION, RIGHT LUNG;  Surgeon: Melrose Nakayama, MD;  Location: [facility];  Service: Thoracic;  Laterality: Right;  . MITRAL VALVE REPAIR N/A 03/24/2017   Procedure: MITRAL VALVE REPAIR (MVR) with Sorin Carbomedics Annuloflex size 28;  Surgeon: Rexene Alberts, MD;  Location: [facility];  Service: Open Heart Surgery;  Laterality: N/A;  . RIGHT/LEFT HEART CATH AND CORONARY ANGIOGRAPHY N/A 08/11/2016   Procedure: Right/Left Heart Cath and Coronary Angiography;  Surgeon: [name], MD;  Location: [facility];  Service: Cardiovascular;  Laterality: N/A;  . TEE WITHOUT CARDIOVERSION N/A 07/17/2016   Procedure: TRANSESOPHAGEAL ECHOCARDIOGRAM (TEE);  Surgeon: [name], MD;  Location: [facility] ENDOSCOPY;  Service: Cardiovascular;  Laterality: N/A;  . TEE WITHOUT CARDIOVERSION N/A 03/24/2017   Procedure: TRANSESOPHAGEAL ECHOCARDIOGRAM (TEE);  Surgeon: Rexene Alberts, MD;  Location: [facility];  Service: Open Heart Surgery;  Laterality: N/A;  . TONSILLECTOMY  1950's  . TOTAL HIP ARTHROPLASTY Left 10/05/2014   dr [name]  . TOTAL HIP ARTHROPLASTY Left 10/05/2014   Procedure: LEFT TOTAL HIP ARTHROPLASTY ANTERIOR APPROACH;  Surgeon: [name], MD;  Location: [facility];  Service: Orthopedics;  Laterality: Left;  Marland Kitchen VIDEO ASSISTED THORACOSCOPY (VATS)/WEDGE RESECTION Right 09/02/2016   Procedure: VIDEO ASSISTED THORACOSCOPY (VATS)/RIGHT UPPER LOBE WEDGE RESECTION;  Surgeon: Melrose Nakayama, MD;  Location: [facility];  Service: Thoracic;  Laterality: Right;  Marland Kitchen VIDEO BRONCHOSCOPY WITH ENDOBRONCHIAL NAVIGATION N/A 10/19/2012   Procedure: VIDEO BRONCHOSCOPY WITH ENDOBRONCHIAL NAVIGATION;  Surgeon: [name], MD;  Location: [facility];  Service: Thoracic;  Laterality: N/A;  . WRIST RECONSTRUCTION Left 12/2009   'proximal row carpectomy" [name]    REVIEW OF SYSTEMS:  A comprehensive review of systems was negative except for:  Constitutional: positive for fatigue   PHYSICAL EXAMINATION: General appearance: alert, cooperative, fatigued and no distress Head: Normocephalic, without obvious abnormality, atraumatic Neck: no adenopathy, no JVD, supple, symmetrical, trachea midline and thyroid not enlarged, symmetric, no tenderness/mass/nodules Lymph nodes: Cervical, supraclavicular, and axillary nodes normal. Resp: clear to auscultation bilaterally Back: symmetric, no curvature. ROM normal. No CVA tenderness. Cardio: regular rate and rhythm, S1, S2 normal, no murmur, click, rub or gallop GI: soft, non-tender; bowel sounds normal; no masses,  no organomegaly Extremities: extremities normal, atraumatic, no cyanosis or edema  ECOG PERFORMANCE STATUS: 1 - Symptomatic but completely ambulatory  Blood pressure 136/66, pulse 64, temperature 98.3 F (36.8 C), temperature source Oral, resp. rate 18, height 5' 9" (1.753 m), weight 225 lb 14.4 oz (102.5 kg), SpO2 99 %.  LABORATORY DATA: Lab Results  Component Value Date   WBC 6.9 07/14/2018   HGB 15.5 07/14/2018   HCT 47.1 07/14/2018   MCV 89.5 07/14/2018   PLT 136 (L) 07/14/2018      Chemistry      Component Value Date/Time   NA 142 06/13/2018 1337   NA 142 10/01/2016 1534   K 4.9 06/13/2018 1337   K 4.2 10/01/2016 1534   CL 109 06/13/2018 1337   CO2 27 06/13/2018 1337   CO2 28 10/01/2016 1534   BUN 21 06/13/2018 1337   BUN 15.9 10/01/2016 1534   CREATININE 1.28 (H) 06/13/2018 1337   CREATININE 1.10 02/08/2017 1101   CREATININE 1.5 (H) 10/01/2016 1534      Component Value Date/Time   CALCIUM 9.2 06/13/2018 1337   CALCIUM 9.6 10/01/2016 1534   ALKPHOS 52 06/13/2018 1337   ALKPHOS 65 10/01/2016 1534   AST 26 06/13/2018 1337   AST 20 10/01/2016 1534   ALT 28 06/13/2018 1337   ALT 24 10/01/2016 1534   BILITOT 0.4 06/13/2018 1337   BILITOT 0.46 10/01/2016 1534       RADIOGRAPHIC STUDIES: No results found.  ASSESSMENT AND PLAN: This is a very  pleasant 67 years old white male with a stage Ia non-small cell lung cancer status post right upper lobectomy with lymph node dissection on September 02, 2016. The patient he was on observation since that time. Unfortunately there are some concerning findings with nodular density in the posterior right lower lobe as well as increase and right pleural effusion suspicious for disease recurrence. He underwent ultrasound-guided right thoracentesis and the cytology of the pleural fluid was consistent with recurrent adenocarcinoma. He had molecular studies performed by [facility] 1 that showed positive EGFR mutation with deletion 83. The patient was started on treatment  with Tagrisso 80 mg p.o. daily on 11/05/2017.  He status post 7 months of treatment. The patient continues to tolerate the treatment well with no concerning complaints. I recommended for him to continue his current treatment with Tagrisso with the same dose. I will see him back for follow-up visit in 1 months for evaluation with repeat CT scan of the chest, abdomen and pelvis for restaging of his disease. The patient was advised to call immediately if he has any concerning symptoms in the interval. The patient voices understanding of current disease status and treatment options and is in agreement with the current care plan. All questions were answered. The patient knows to call the clinic with any problems, questions or concerns. We can certainly see the patient much sooner if necessary.  Disclaimer: This note was dictated with voice recognition software. Similar sounding words can inadvertently be transcribed and may not be corrected upon review.

## 2018-07-14 NOTE — Telephone Encounter (Signed)
Scheduled appt per 3/5 los.  Gave the patient contrast and the number to central radiology.  Printed calendar and avs.

## 2018-07-20 ENCOUNTER — Other Ambulatory Visit: Payer: Self-pay | Admitting: Internal Medicine

## 2018-07-20 DIAGNOSIS — C3491 Malignant neoplasm of unspecified part of right bronchus or lung: Secondary | ICD-10-CM

## 2018-07-27 MED FILL — TAGRISSO 80 MG TABLET: 80 | 30 days supply | Qty: 30 | Fill #0

## 2018-08-12 ENCOUNTER — Ambulatory Visit (HOSPITAL_COMMUNITY)
Admission: RE | Admit: 2018-08-12 | Discharge: 2018-08-12 | Disposition: A | Discharge status: Home / Self Care | Payer: Medicare Other | Source: Ambulatory Visit | Attending: Internal Medicine | Admitting: Internal Medicine

## 2018-08-12 ENCOUNTER — Inpatient Hospital Stay: Payer: Medicare Other | Attending: Internal Medicine

## 2018-08-12 ENCOUNTER — Other Ambulatory Visit: Payer: Self-pay

## 2018-08-12 DIAGNOSIS — Z951 Presence of aortocoronary bypass graft: Secondary | ICD-10-CM | POA: Diagnosis not present

## 2018-08-12 DIAGNOSIS — Z880 Allergy status to penicillin: Secondary | ICD-10-CM | POA: Diagnosis not present

## 2018-08-12 DIAGNOSIS — I2510 Atherosclerotic heart disease of native coronary artery without angina pectoris: Secondary | ICD-10-CM | POA: Insufficient documentation

## 2018-08-12 DIAGNOSIS — I5032 Chronic diastolic (congestive) heart failure: Secondary | ICD-10-CM | POA: Diagnosis not present

## 2018-08-12 DIAGNOSIS — E785 Hyperlipidemia, unspecified: Secondary | ICD-10-CM | POA: Insufficient documentation

## 2018-08-12 DIAGNOSIS — Z7901 Long term (current) use of anticoagulants: Secondary | ICD-10-CM | POA: Insufficient documentation

## 2018-08-12 DIAGNOSIS — E1151 Type 2 diabetes mellitus with diabetic peripheral angiopathy without gangrene: Secondary | ICD-10-CM | POA: Insufficient documentation

## 2018-08-12 DIAGNOSIS — C3490 Malignant neoplasm of unspecified part of unspecified bronchus or lung: Secondary | ICD-10-CM | POA: Insufficient documentation

## 2018-08-12 DIAGNOSIS — Z902 Acquired absence of lung [part of]: Secondary | ICD-10-CM | POA: Diagnosis not present

## 2018-08-12 DIAGNOSIS — Z79899 Other long term (current) drug therapy: Secondary | ICD-10-CM | POA: Diagnosis not present

## 2018-08-12 DIAGNOSIS — I110 Hypertensive heart disease with heart failure: Secondary | ICD-10-CM | POA: Insufficient documentation

## 2018-08-12 DIAGNOSIS — K219 Gastro-esophageal reflux disease without esophagitis: Secondary | ICD-10-CM | POA: Diagnosis not present

## 2018-08-12 DIAGNOSIS — J90 Pleural effusion, not elsewhere classified: Secondary | ICD-10-CM | POA: Diagnosis not present

## 2018-08-12 DIAGNOSIS — Z86718 Personal history of other venous thrombosis and embolism: Secondary | ICD-10-CM | POA: Insufficient documentation

## 2018-08-12 DIAGNOSIS — G4733 Obstructive sleep apnea (adult) (pediatric): Secondary | ICD-10-CM | POA: Diagnosis not present

## 2018-08-12 LAB — CBC WITH DIFFERENTIAL (CANCER CENTER ONLY)
Abs Immature Granulocytes: 0.03 10*3/uL (ref 0.00–0.07)
Basophils Absolute: 0 10*3/uL (ref 0.0–0.1)
Basophils Relative: 1 %
Eosinophils Absolute: 0.1 10*3/uL (ref 0.0–0.5)
Eosinophils Relative: 2 %
HCT: 47.2 % (ref 39.0–52.0)
Hemoglobin: 15.2 g/dL (ref 13.0–17.0)
Immature Granulocytes: 1 %
Lymphocytes Relative: 23 %
Lymphs Abs: 1.3 10*3/uL (ref 0.7–4.0)
MCH: 30.4 pg (ref 26.0–34.0)
MCHC: 32.2 g/dL (ref 30.0–36.0)
MCV: 94.4 fL (ref 80.0–100.0)
Monocytes Absolute: 0.7 10*3/uL (ref 0.1–1.0)
Monocytes Relative: 12 %
Neutro Abs: 3.4 10*3/uL (ref 1.7–7.7)
Neutrophils Relative %: 61 %
Platelet Count: 128 10*3/uL — ABNORMAL LOW (ref 150–400)
RBC: 5 MIL/uL (ref 4.22–5.81)
RDW: 15.8 % — ABNORMAL HIGH (ref 11.5–15.5)
WBC Count: 5.5 10*3/uL (ref 4.0–10.5)
nRBC: 0 % (ref 0.0–0.2)

## 2018-08-12 LAB — CMP (CANCER CENTER ONLY)
ALT: 36 U/L (ref 0–44)
AST: 30 U/L (ref 15–41)
Albumin: 4 g/dL (ref 3.5–5.0)
Alkaline Phosphatase: 52 U/L (ref 38–126)
Anion gap: 9 (ref 5–15)
BUN: 16 mg/dL (ref 8–23)
CO2: 23 mmol/L (ref 22–32)
Calcium: 9.2 mg/dL (ref 8.9–10.3)
Chloride: 107 mmol/L (ref 98–111)
Creatinine: 1.29 mg/dL — ABNORMAL HIGH (ref 0.61–1.24)
GFR, Est AFR Am: >60 mL/min (ref >60)
GFR, Estimated: 56 mL/min — ABNORMAL LOW (ref >60)
Glucose, Bld: 113 mg/dL — ABNORMAL HIGH (ref 70–99)
Potassium: 4.2 mmol/L (ref 3.5–5.1)
Sodium: 139 mmol/L (ref 135–145)
Total Bilirubin: 0.6 mg/dL (ref 0.3–1.2)
Total Protein: 7.1 g/dL (ref 6.5–8.1)

## 2018-08-12 MED ORDER — SODIUM CHLORIDE (PF) 0.9 % IJ SOLN
INTRAMUSCULAR | Status: AC
  Filled 2018-08-12: qty 50

## 2018-08-12 MED ORDER — IOHEXOL 300 MG/ML  SOLN
100.0000 mL | Freq: Once | INTRAMUSCULAR | Status: AC | PRN
  Administered 2018-08-12: 100 mL via INTRAVENOUS

## 2018-08-15 ENCOUNTER — Encounter: Payer: Self-pay | Admitting: Internal Medicine

## 2018-08-15 ENCOUNTER — Inpatient Hospital Stay (HOSPITAL_BASED_OUTPATIENT_CLINIC_OR_DEPARTMENT_OTHER): Payer: Medicare Other | Admitting: Internal Medicine

## 2018-08-15 DIAGNOSIS — C3491 Malignant neoplasm of unspecified part of right bronchus or lung: Secondary | ICD-10-CM

## 2018-08-15 DIAGNOSIS — Z79899 Other long term (current) drug therapy: Secondary | ICD-10-CM | POA: Diagnosis not present

## 2018-08-15 DIAGNOSIS — M549 Dorsalgia, unspecified: Secondary | ICD-10-CM | POA: Diagnosis not present

## 2018-08-15 DIAGNOSIS — Z5111 Encounter for antineoplastic chemotherapy: Secondary | ICD-10-CM

## 2018-08-15 NOTE — Progress Notes (Signed)
Virtual Visit via Telephone Note  I connected with James [name] on 08/15/18 at 11:30 AM EDT by telephone and verified that I am speaking with the correct person using two identifiers.   I discussed the limitations, risks, security and privacy concerns of performing an evaluation and management service by telephone and the availability of in person appointments. I also discussed with the patient that there may be a patient responsible charge related to this service. The patient expressed understanding and agreed to proceed.       [place] [phone]   [phone]  OFFICE PROGRESS NOTE  [name], MD [address] [facility] [address] [place] [address]  DIAGNOSIS: Recurrent non-small cell lung cancer, adenocarcinoma initially diagnosed as stage IA (T1c, N0, M0) non-small cell lung cancer, adenocarcinoma presented with right upper lobe lung nodule in April 2018 with recurrence in June 2019.  Biomarker Findings Microsatellite status - MS-Stable Tumor Mutational Burden - TMB-Low (3 Muts/Mb) Genomic Findings For a complete list of the genes assayed, please refer to the Appendix. EGFR exon 19 deletion ([id number]) CDKN2A/B loss NKX2-1 amplification 7 Disease relevant genes with no reportable alterations: KRAS, ALK, BRAF, MET, RET, ERBB2, ROS1   PRIOR THERAPY: Status post right upper lobectomy with lymph node dissection under the care of Dr. [name] on 09/02/2016.  History of Present Illness: James [name] 68 y.o. male has a visual telephone visit with me today for evaluation and discussion of his imaging studies.  The patient mentioned that he is feeling fine except for low back pain started few weeks ago.  It is limiting him from exercising at regular basis.  He does not try any NSAIDs because of his current treatment with Xarelto.  He denied having any current chest pain, shortness of breath, cough or hemoptysis.  He denied having any fever  or chills.  He has no nausea, vomiting, diarrhea or constipation.  He denied having any headache or visual changes.  The patient had repeat CT scan of the chest performed recently and we are having the visit for discussion of his scan results and recommendation regarding treatment of his condition.  MEDICAL HISTORY: Past Medical History:  Diagnosis Date   Adenocarcinoma of right lung, stage 1 ([place]) 09/06/2016   Anxiety    Arthritis    "knees, hips, back" (10/19/2012)   Chronic diastolic congestive heart failure (HCC)    Chronic lower back pain    Colon polyps    10/27/2002, repeat letter 09/17/2007   Coronary artery disease    Coronary artery disease involving native coronary artery of native heart without angina pectoris    Depressive disorder, not elsewhere classified    no meds   Diabetes mellitus without complication ([place])    diet controlled- no med  (while in hosp 4/18 -elevated cbg   Dyspnea    Fasting hyperglycemia    GERD (gastroesophageal reflux disease)    Heart murmur    Hemoptysis    abnormal CT Chest 01/29/10 - ? new GG changes RUL > not viz on plain cxr 02/26/2010   Hypertension    Mitral regurgitation    severe MR 08/2016   MPN (myeloproliferative neoplasm) ([place])    1st detected 06/04/1998   Obesity    OSA on CPAP    last sleep study 10 years ago   Other and unspecified hyperlipidemia    Peripheral vascular disease ([place]) 08/2016   after lung surgey small clots in lungs,after hip dvt-5/16  Pneumonia    4/18   Positive PPD 1965   "non reactive in 2012" (10/19/2012)   Routine general medical examination at a health care facility    S/P CABG x 1 03/24/2017   LIMA to LAD   S/P mitral valve repair 03/24/2017   Complex valvuloplasty including artificial Gore-tex neochord placement x6 and 28 mm Sorin Annuloflex posterior annuloplasty band   Special screening for malignant neoplasm of prostate    Spinal stenosis, unspecified region other than  cervical    Wrist pain, left     ALLERGIES:  is allergic to symbicort [budesonide-formoterol fumarate] and amoxicillin.  MEDICATIONS:  Current Outpatient Medications  Medication Sig Dispense Refill   furosemide (LASIX) 40 MG tablet Take 1 tablet (40 mg total) by mouth as needed for fluid. (Patient not taking: Reported on 06/13/2018)     metoprolol tartrate (LOPRESSOR) 25 MG tablet TAKE 1/2 TABLET BY MOUTH 2 TIMES DAILY 90 tablet 3   Multiple Vitamin (MULTIVITAMIN) tablet Take 1 tablet by mouth daily.     Omega-3 Fatty Acids (FISH OIL) 500 MG CAPS Take 500 mg by mouth daily.      potassium chloride SA (K-DUR,KLOR-CON) 20 MEQ tablet Take 1 tablet (20 mEq total) by mouth daily as needed. With lasix (Patient not taking: Reported on 06/13/2018) 90 tablet 0   rivaroxaban (XARELTO) 20 MG TABS tablet Take 1 tablet (20 mg total) by mouth daily with supper. 90 tablet 3   TAGRISSO 80 MG tablet TAKE 1 TABLET (80 MG TOTAL) BY MOUTH DAILY. 30 tablet 2   traMADol (ULTRAM) 50 MG tablet Take 1 tablet (50 mg total) by mouth every 6 (six) hours as needed (may take one or two tablets every six hrs prn). (Patient not taking: Reported on 06/13/2018) 30 tablet 0   No current facility-administered medications for this visit.     SURGICAL HISTORY:  Past Surgical History:  Procedure Laterality Date   ANTERIOR CRUCIATE LIGAMENT REPAIR Left 1967   CARDIAC CATHETERIZATION  2000   CHEST TUBE INSERTION Right 10/19/2012   post bronch   COLONOSCOPY W/ POLYPECTOMY     CORONARY ARTERY BYPASS GRAFT N/A 03/24/2017   Procedure: CORONARY ARTERY BYPASS GRAFTING (CABG)x1 using left internal mammary artery, LIMA-LAD;  Surgeon: Rexene Alberts, MD;  Location: [facility];  Service: Open Heart Surgery;  Laterality: N/A;   FLEXIBLE BRONCHOSCOPY  10/19/2012   Flexible video fiberoptic bronchoscopy with electromagnetic navigation and biopsies.   INTRAVASCULAR PRESSURE WIRE/FFR STUDY N/A 08/11/2016   Procedure: Intravascular  Pressure Wire/FFR Study;  Surgeon: [name], MD;  Location: [facility] [facility];  Service: Cardiovascular;  Laterality: N/A;   IR ANGIOGRAM PULMONARY BILATERAL SELECTIVE  02/06/2018   IR ANGIOGRAM SELECTIVE EACH ADDITIONAL VESSEL  02/06/2018   IR ANGIOGRAM SELECTIVE EACH ADDITIONAL VESSEL  02/06/2018   IR INFUSION THROMBOL ARTERIAL INITIAL (MS)  02/06/2018   IR INFUSION THROMBOL ARTERIAL INITIAL (MS)  02/06/2018   IR THROMB F/U EVAL ART/VEN FINAL DAY (MS)  02/07/2018   IR US GUIDE VASC ACCESS RIGHT  02/06/2018   LOBECTOMY Right 09/02/2016   Procedure: RIGHT UPPER LOBECTOMY;  Surgeon: Melrose Nakayama, MD;  Location: [facility];  Service: Thoracic;  Laterality: Right;   LYMPH NODE DISSECTION Right 09/02/2016   Procedure: LYMPH NODE DISSECTION, RIGHT LUNG;  Surgeon: Melrose Nakayama, MD;  Location: [facility];  Service: Thoracic;  Laterality: Right;   MITRAL VALVE REPAIR N/A 03/24/2017   Procedure: MITRAL VALVE REPAIR (MVR) with Sorin Carbomedics Annuloflex  size 28;  Surgeon: Rexene Alberts, MD;  Location: [facility];  Service: Open Heart Surgery;  Laterality: N/A;   RIGHT/LEFT HEART CATH AND CORONARY ANGIOGRAPHY N/A 08/11/2016   Procedure: Right/Left Heart Cath and Coronary Angiography;  Surgeon: [name], MD;  Location: [facility] [place] [facility];  Service: Cardiovascular;  Laterality: N/A;   TEE WITHOUT CARDIOVERSION N/A 07/17/2016   Procedure: TRANSESOPHAGEAL ECHOCARDIOGRAM (TEE);  Surgeon: [name], MD;  Location: [facility] ENDOSCOPY;  Service: Cardiovascular;  Laterality: N/A;   TEE WITHOUT CARDIOVERSION N/A 03/24/2017   Procedure: TRANSESOPHAGEAL ECHOCARDIOGRAM (TEE);  Surgeon: Rexene Alberts, MD;  Location: [facility];  Service: Open Heart Surgery;  Laterality: N/A;   TONSILLECTOMY  1950's   TOTAL HIP ARTHROPLASTY Left 10/05/2014   dr [name]   TOTAL HIP ARTHROPLASTY Left 10/05/2014   Procedure: LEFT TOTAL HIP ARTHROPLASTY ANTERIOR APPROACH;  Surgeon: [name], MD;  Location: [place];   Service: Orthopedics;  Laterality: Left;   VIDEO ASSISTED THORACOSCOPY (VATS)/WEDGE RESECTION Right 09/02/2016   Procedure: VIDEO ASSISTED THORACOSCOPY (VATS)/RIGHT UPPER LOBE WEDGE RESECTION;  Surgeon: Melrose Nakayama, MD;  Location: [place];  Service: Thoracic;  Laterality: Right;   VIDEO BRONCHOSCOPY WITH ENDOBRONCHIAL NAVIGATION N/A 10/19/2012   Procedure: VIDEO BRONCHOSCOPY WITH ENDOBRONCHIAL NAVIGATION;  Surgeon: [name], MD;  Location: [facility];  Service: Thoracic;  Laterality: N/A;   WRIST RECONSTRUCTION Left 12/2009   'proximal row carpectomy" [name]    REVIEW OF SYSTEMS:  Constitutional: positive for fatigue Eyes: negative Ears, nose, mouth, throat, and face: negative Respiratory: negative Cardiovascular: negative Gastrointestinal: negative Genitourinary:negative Integument/breast: negative Hematologic/lymphatic: negative Musculoskeletal:positive for back pain Neurological: negative Behavioral/Psych: negative Endocrine: negative Allergic/Immunologic: negative   LABORATORY DATA: Lab Results  Component Value Date   WBC 5.5 08/12/2018   HGB 15.2 08/12/2018   HCT 47.2 08/12/2018   MCV 94.4 08/12/2018   PLT 128 (L) 08/12/2018      Chemistry      Component Value Date/Time   NA 139 08/12/2018 1118   NA 142 10/01/2016 1534   K 4.2 08/12/2018 1118   K 4.2 10/01/2016 1534   CL 107 08/12/2018 1118   CO2 23 08/12/2018 1118   CO2 28 10/01/2016 1534   BUN 16 08/12/2018 1118   BUN 15.9 10/01/2016 1534   CREATININE 1.29 (H) 08/12/2018 1118   CREATININE 1.10 02/08/2017 1101   CREATININE 1.5 (H) 10/01/2016 1534      Component Value Date/Time   CALCIUM 9.2 08/12/2018 1118   CALCIUM 9.6 10/01/2016 1534   ALKPHOS 52 08/12/2018 1118   ALKPHOS 65 10/01/2016 1534   AST 30 08/12/2018 1118   AST 20 10/01/2016 1534   ALT 36 08/12/2018 1118   ALT 24 10/01/2016 1534   BILITOT 0.6 08/12/2018 1118   BILITOT 0.46 10/01/2016 1534       RADIOGRAPHIC STUDIES: Ct Chest  W Contrast  Result Date: 08/12/2018 CLINICAL DATA:  Right upper lobe lung adenocarcinoma status post right upper lobectomy 09/02/2016. Recurrent adenocarcinoma as identified on right thoracentesis 10/13/2017. Ongoing medical therapy. Restaging. EXAM: CT CHEST, ABDOMEN, AND PELVIS WITH CONTRAST TECHNIQUE: Multidetector CT imaging of the chest, abdomen and pelvis was performed following the standard protocol during bolus administration of intravenous contrast. CONTRAST:  191m OMNIPAQUE IOHEXOL 300 MG/ML  SOLN COMPARISON:  05/13/2018 CT chest, abdomen and pelvis. FINDINGS: CT CHEST FINDINGS Cardiovascular: Normal heart size. No significant pericardial effusion/thickening. Left anterior descending coronary atherosclerosis status post CABG. Atherosclerotic nonaneurysmal thoracic aorta. Stable top-normal caliber main pulmonary  artery (3.0 cm diameter). No central pulmonary emboli. Mediastinum/Nodes: No discrete thyroid nodules. Unremarkable esophagus. No pathologically enlarged axillary, mediastinal or hilar lymph nodes. Lungs/Pleura: No pneumothorax. Status post right upper lobectomy. Small partially loculated right pleural effusion is slightly decreased. No discrete pleural nodularity. No left pleural effusion. Stable 2 mm right middle lobe solid pulmonary nodule (series 6/image 83). Peripheral right lower lobe 5 mm solid pulmonary nodule is stable (series 6/image 63). Stable tiny calcified granulomas in the posterior left lower lobe. Stable focal branching mucoid impaction with tree-in-bud opacity in posteromedial left upper lobe (series 6/image 42). No acute consolidative airspace disease or new significant pulmonary nodules. Musculoskeletal: No aggressive appearing focal osseous lesions. Intact sternotomy wires. Mild thoracic spondylosis. CT ABDOMEN PELVIS FINDINGS Hepatobiliary: Normal liver size. Scattered simple liver cysts, largest 4.6 cm in the posterior right liver lobe, unchanged. Subcentimeter hypodense  peripheral right liver lesion is too small to characterize and stable. No new liver lesions. Normal gallbladder with no radiopaque cholelithiasis. No biliary ductal dilatation. Pancreas: Normal, with no mass or duct dilation. Spleen: Normal size. No mass. Adrenals/Urinary Tract: Normal adrenals. Simple 3.2 cm lower left renal cyst. Simple parapelvic left renal cysts. No right renal lesions. No hydronephrosis. Duplicated left renal collecting system. Bladder obscured by streak artifact from left hip hardware, with no gross bladder abnormality. Stomach/Bowel: Normal non-distended stomach. Normal caliber small bowel with no small bowel wall thickening. Normal appendix. Mild left colonic diverticulosis, with no large bowel wall thickening or significant pericolonic fat stranding. Vascular/Lymphatic: Atherosclerotic nonaneurysmal abdominal aorta. Patent portal, splenic and renal veins. 1 No pathologically enlarged lymph nodes in the abdomen or pelvis. Reproductive: Mild prostatomegaly. Other: No pneumoperitoneum, ascites or focal fluid collection. Musculoskeletal: No aggressive appearing focal osseous lesions. Left total hip arthroplasty. Marked lumbar spondylosis. IMPRESSION: 1. No evidence of new or progressive metastatic disease in the chest. Small loculated right pleural effusion is slightly decreased. Scattered small pulmonary nodules are stable. No recurrent thoracic adenopathy. 2. No evidence of metastatic disease in the abdomen or pelvis. 3. Aortic Atherosclerosis (ICD10-I70.0). Additional chronic findings as detailed. Electronically Signed   By: Ilona Sorrel M.D.   On: 08/12/2018 15:19   Ct Abdomen Pelvis W Contrast  Result Date: 08/12/2018 CLINICAL DATA:  Right upper lobe lung adenocarcinoma status post right upper lobectomy 09/02/2016. Recurrent adenocarcinoma as identified on right thoracentesis 10/13/2017. Ongoing medical therapy. Restaging. EXAM: CT CHEST, ABDOMEN, AND PELVIS WITH CONTRAST TECHNIQUE:  Multidetector CT imaging of the chest, abdomen and pelvis was performed following the standard protocol during bolus administration of intravenous contrast. CONTRAST:  169m OMNIPAQUE IOHEXOL 300 MG/ML  SOLN COMPARISON:  05/13/2018 CT chest, abdomen and pelvis. FINDINGS: CT CHEST FINDINGS Cardiovascular: Normal heart size. No significant pericardial effusion/thickening. Left anterior descending coronary atherosclerosis status post CABG. Atherosclerotic nonaneurysmal thoracic aorta. Stable top-normal caliber main pulmonary artery (3.0 cm diameter). No central pulmonary emboli. Mediastinum/Nodes: No discrete thyroid nodules. Unremarkable esophagus. No pathologically enlarged axillary, mediastinal or hilar lymph nodes. Lungs/Pleura: No pneumothorax. Status post right upper lobectomy. Small partially loculated right pleural effusion is slightly decreased. No discrete pleural nodularity. No left pleural effusion. Stable 2 mm right middle lobe solid pulmonary nodule (series 6/image 83). Peripheral right lower lobe 5 mm solid pulmonary nodule is stable (series 6/image 63). Stable tiny calcified granulomas in the posterior left lower lobe. Stable focal branching mucoid impaction with tree-in-bud opacity in posteromedial left upper lobe (series 6/image 42). No acute consolidative airspace disease or new significant pulmonary nodules. Musculoskeletal: No aggressive appearing  focal osseous lesions. Intact sternotomy wires. Mild thoracic spondylosis. CT ABDOMEN PELVIS FINDINGS Hepatobiliary: Normal liver size. Scattered simple liver cysts, largest 4.6 cm in the posterior right liver lobe, unchanged. Subcentimeter hypodense peripheral right liver lesion is too small to characterize and stable. No new liver lesions. Normal gallbladder with no radiopaque cholelithiasis. No biliary ductal dilatation. Pancreas: Normal, with no mass or duct dilation. Spleen: Normal size. No mass. Adrenals/Urinary Tract: Normal adrenals. Simple 3.2 cm  lower left renal cyst. Simple parapelvic left renal cysts. No right renal lesions. No hydronephrosis. Duplicated left renal collecting system. Bladder obscured by streak artifact from left hip hardware, with no gross bladder abnormality. Stomach/Bowel: Normal non-distended stomach. Normal caliber small bowel with no small bowel wall thickening. Normal appendix. Mild left colonic diverticulosis, with no large bowel wall thickening or significant pericolonic fat stranding. Vascular/Lymphatic: Atherosclerotic nonaneurysmal abdominal aorta. Patent portal, splenic and renal veins. 1 No pathologically enlarged lymph nodes in the abdomen or pelvis. Reproductive: Mild prostatomegaly. Other: No pneumoperitoneum, ascites or focal fluid collection. Musculoskeletal: No aggressive appearing focal osseous lesions. Left total hip arthroplasty. Marked lumbar spondylosis. IMPRESSION: 1. No evidence of new or progressive metastatic disease in the chest. Small loculated right pleural effusion is slightly decreased. Scattered small pulmonary nodules are stable. No recurrent thoracic adenopathy. 2. No evidence of metastatic disease in the abdomen or pelvis. 3. Aortic Atherosclerosis (ICD10-I70.0). Additional chronic findings as detailed. Electronically Signed   By: Ilona Sorrel M.D.   On: 08/12/2018 15:19    ASSESSMENT AND PLAN: This is a very pleasant 68 years old white male with a stage Ia non-small cell lung cancer status post right upper lobectomy with lymph node dissection on September 02, 2016. The patient he was on observation since that time. Unfortunately there are some concerning findings with nodular density in the posterior right lower lobe as well as increase and right pleural effusion suspicious for disease recurrence. He underwent ultrasound-guided right thoracentesis and the cytology of the pleural fluid was consistent with recurrent adenocarcinoma. He had molecular studies performed by [facility] 1 that showed  positive EGFR mutation with deletion 40. The patient was started on treatment with Tagrisso 80 mg p.o. daily on 11/05/2017.  He status post 8 months of treatment. The patient has been tolerating this treatment well with no concerning complaints. He had repeat CT scan of the chest, abdomen and pelvis performed recently.  I personally and independently reviewed the scan with the patient today. His scan showed no concerning findings for disease progression and no evidence of skeletal metastasis to explain his low back pain which likely secondary to muscle spasm. I recommended for the patient to use local [place] patches to these areas.  If no improvement in his pain we may consider him for MRI of the lower back to rule out any metastatic disease. I recommended for the patient to continue his current treatment with Tagrisso. I will see him back for follow-up visit in 6 weeks for evaluation and repeat blood work. He was advised to call immediately if he has any concerning symptoms in the interval. The patient voices understanding of current disease status and treatment options and is in agreement with the current care plan. All questions were answered. The patient knows to call the clinic with any problems, questions or concerns. We can certainly see the patient much sooner if necessary.  Disclaimer: This note was dictated with voice recognition software. Similar sounding words can inadvertently be transcribed and may not be corrected upon review.  Follow Up Instructions: Lab and follow-up visit in 6 weeks.   I discussed the assessment and treatment plan with the patient. The patient was provided an opportunity to ask questions and all were answered. The patient agreed with the plan and demonstrated an understanding of the instructions.   The patient was advised to call back or seek an in-person evaluation if the symptoms worsen or if the condition fails to improve as anticipated.  I provided 15  minutes of non-face-to-face time during this encounter.   Eilleen Kempf, MD

## 2018-08-16 ENCOUNTER — Telehealth: Payer: Self-pay | Admitting: Internal Medicine

## 2018-08-16 NOTE — Telephone Encounter (Signed)
Tried to reach regarding 5/18

## 2018-08-29 MED FILL — TAGRISSO 80 MG TABLET: 80 | 30 days supply | Qty: 30 | Fill #1

## 2018-09-23 MED FILL — TAGRISSO 80 MG TABLET: 80 | 30 days supply | Qty: 30 | Fill #2

## 2018-09-26 ENCOUNTER — Encounter: Payer: Self-pay | Admitting: Internal Medicine

## 2018-09-26 ENCOUNTER — Other Ambulatory Visit: Payer: Self-pay

## 2018-09-26 ENCOUNTER — Telehealth: Payer: Self-pay | Admitting: Internal Medicine

## 2018-09-26 ENCOUNTER — Inpatient Hospital Stay: Payer: Medicare Other | Attending: Internal Medicine | Admitting: Internal Medicine

## 2018-09-26 ENCOUNTER — Inpatient Hospital Stay: Payer: Medicare Other

## 2018-09-26 VITALS — BP 149/81 | HR 85 | Temp 98.2°F | Resp 18 | Ht 69.0 in | Wt 226.9 lb

## 2018-09-26 DIAGNOSIS — C3491 Malignant neoplasm of unspecified part of right bronchus or lung: Secondary | ICD-10-CM

## 2018-09-26 DIAGNOSIS — Z902 Acquired absence of lung [part of]: Secondary | ICD-10-CM | POA: Diagnosis not present

## 2018-09-26 DIAGNOSIS — Z79899 Other long term (current) drug therapy: Secondary | ICD-10-CM | POA: Insufficient documentation

## 2018-09-26 DIAGNOSIS — C3411 Malignant neoplasm of upper lobe, right bronchus or lung: Secondary | ICD-10-CM | POA: Diagnosis present

## 2018-09-26 DIAGNOSIS — E291 Testicular hypofunction: Secondary | ICD-10-CM | POA: Insufficient documentation

## 2018-09-26 DIAGNOSIS — N529 Male erectile dysfunction, unspecified: Secondary | ICD-10-CM | POA: Insufficient documentation

## 2018-09-26 DIAGNOSIS — C3490 Malignant neoplasm of unspecified part of unspecified bronchus or lung: Secondary | ICD-10-CM

## 2018-09-26 DIAGNOSIS — Z5111 Encounter for antineoplastic chemotherapy: Secondary | ICD-10-CM

## 2018-09-26 LAB — CBC WITH DIFFERENTIAL (CANCER CENTER ONLY)
Abs Immature Granulocytes: 0.02 10*3/uL (ref 0.00–0.07)
Basophils Absolute: 0 10*3/uL (ref 0.0–0.1)
Basophils Relative: 1 %
Eosinophils Absolute: 0.1 10*3/uL (ref 0.0–0.5)
Eosinophils Relative: 1 %
HCT: 46.2 % (ref 39.0–52.0)
Hemoglobin: 15.1 g/dL (ref 13.0–17.0)
Immature Granulocytes: 0 %
Lymphocytes Relative: 21 %
Lymphs Abs: 1.2 10*3/uL (ref 0.7–4.0)
MCH: 31.2 pg (ref 26.0–34.0)
MCHC: 32.7 g/dL (ref 30.0–36.0)
MCV: 95.5 fL (ref 80.0–100.0)
Monocytes Absolute: 0.7 10*3/uL (ref 0.1–1.0)
Monocytes Relative: 12 %
Neutro Abs: 3.7 10*3/uL (ref 1.7–7.7)
Neutrophils Relative %: 65 %
Platelet Count: 138 10*3/uL — ABNORMAL LOW (ref 150–400)
RBC: 4.84 MIL/uL (ref 4.22–5.81)
RDW: 14.8 % (ref 11.5–15.5)
WBC Count: 5.7 10*3/uL (ref 4.0–10.5)
nRBC: 0 % (ref 0.0–0.2)

## 2018-09-26 LAB — CMP (CANCER CENTER ONLY)
ALT: 39 U/L (ref 0–44)
AST: 39 U/L (ref 15–41)
Albumin: 3.9 g/dL (ref 3.5–5.0)
Alkaline Phosphatase: 60 U/L (ref 38–126)
Anion gap: 10 (ref 5–15)
BUN: 28 mg/dL — ABNORMAL HIGH (ref 8–23)
CO2: 20 mmol/L — ABNORMAL LOW (ref 22–32)
Calcium: 9.1 mg/dL (ref 8.9–10.3)
Chloride: 112 mmol/L — ABNORMAL HIGH (ref 98–111)
Creatinine: 1.38 mg/dL — ABNORMAL HIGH (ref 0.61–1.24)
GFR, Est AFR Am: >60 mL/min (ref >60)
GFR, Estimated: 52 mL/min — ABNORMAL LOW (ref >60)
Glucose, Bld: 144 mg/dL — ABNORMAL HIGH (ref 70–99)
Potassium: 4.9 mmol/L (ref 3.5–5.1)
Sodium: 142 mmol/L (ref 135–145)
Total Bilirubin: 0.2 mg/dL — ABNORMAL LOW (ref 0.3–1.2)
Total Protein: 7.1 g/dL (ref 6.5–8.1)

## 2018-09-26 NOTE — Telephone Encounter (Signed)
Scheduled appt per 5/18 los - mailed letter for appt with appt date and time

## 2018-09-26 NOTE — Progress Notes (Signed)
[place] [phone]   Fax:([phone]  OFFICE PROGRESS NOTE  [name], MD [address] [place] [address]  DIAGNOSIS: Recurrent non-small cell lung cancer, adenocarcinoma initially diagnosed as stage IA (T1c, N0, M0) non-small cell lung cancer, adenocarcinoma presented with right upper lobe lung nodule in April 2018 with recurrence in June 2019.  Biomarker Findings Microsatellite status - MS-Stable Tumor Mutational Burden - TMB-Low (3 Muts/Mb) Genomic Findings For a complete list of the genes assayed, please refer to the Appendix. EGFR exon 19 deletion ([id number]>A) CDKN2A/B loss NKX2-1 amplification 7 Disease relevant genes with no reportable alterations: KRAS, ALK, BRAF, MET, RET, ERBB2, ROS1   PRIOR THERAPY: Status post right upper lobectomy with lymph node dissection under the care of Dr. [name] on 09/02/2016.  CURRENT THERAPY: Tagrisso 80 mg p.o. daily.  First dose started November 05, 2017.  Status post 11 months of treatment.  INTERVAL HISTORY: James [name] 68 y.o. male returns to the clinic today for follow-up visit.  The patient is feeling fine today with no concerning complaints except for shortness of breath with exertion.  He denied having any chest pain, cough or hemoptysis.  He denied having any fever or chills.  He has no nausea, vomiting, diarrhea or constipation.  He is complaining of erectile dysfunction secondary to hypogonadism but he has not seen any urologist in the past.  He was managed by his primary care physician Dr. Veverly Fells before retirement.  He has not discussed this issue with his new primary care physician yet.  The patient is here today for evaluation and repeat blood work.  MEDICAL HISTORY: Past Medical History:  Diagnosis Date   Adenocarcinoma of right lung, stage 1 ([place]) 09/06/2016   Anxiety    Arthritis    "knees, hips, back" (10/19/2012)   Chronic diastolic congestive heart  failure (HCC)    Chronic lower back pain    Colon polyps    10/27/2002, repeat letter 09/17/2007   Coronary artery disease    Coronary artery disease involving native coronary artery of native heart without angina pectoris    Depressive disorder, not elsewhere classified    no meds   Diabetes mellitus without complication ([place])    diet controlled- no med  (while in hosp 4/18 -elevated cbg   Dyspnea    Fasting hyperglycemia    GERD (gastroesophageal reflux disease)    Heart murmur    Hemoptysis    abnormal CT Chest 01/29/10 - ? new GG changes RUL > not viz on plain cxr 02/26/2010   Hypertension    Mitral regurgitation    severe MR 08/2016   MPN (myeloproliferative neoplasm) ([place])    1st detected 06/04/1998   Obesity    OSA on CPAP    last sleep study 10 years ago   Other and unspecified hyperlipidemia    Peripheral vascular disease ([place]) 08/2016   after lung surgey small clots in lungs,after hip dvt-5/16   Pneumonia    4/18   Positive PPD 1965   "non reactive in 2012" (10/19/2012)   Routine general medical examination at a health care facility    S/P CABG x 1 03/24/2017   [name] to LAD   S/P mitral valve repair 03/24/2017   Complex valvuloplasty including artificial Gore-tex neochord placement x6 and 28 mm Sorin Annuloflex posterior annuloplasty band   Special screening for malignant neoplasm of prostate    Spinal stenosis, unspecified region other than  cervical    Wrist pain, left     ALLERGIES:  is allergic to symbicort [budesonide-formoterol fumarate] and amoxicillin.  MEDICATIONS:  Current Outpatient Medications  Medication Sig Dispense Refill   furosemide (LASIX) 40 MG tablet Take 1 tablet (40 mg total) by mouth as needed for fluid. (Patient not taking: Reported on 06/13/2018)     metoprolol tartrate (LOPRESSOR) 25 MG tablet TAKE 1/2 TABLET BY MOUTH 2 TIMES DAILY 90 tablet 3   Multiple Vitamin (MULTIVITAMIN) tablet Take 1 tablet by mouth daily.       Omega-3 Fatty Acids (FISH OIL) 500 MG CAPS Take 500 mg by mouth daily.      potassium chloride SA (K-DUR,KLOR-CON) 20 MEQ tablet Take 1 tablet (20 mEq total) by mouth daily as needed. With lasix (Patient not taking: Reported on 06/13/2018) 90 tablet 0   rivaroxaban (XARELTO) 20 MG TABS tablet Take 1 tablet (20 mg total) by mouth daily with supper. 90 tablet 3   TAGRISSO 80 MG tablet TAKE 1 TABLET (80 MG TOTAL) BY MOUTH DAILY. 30 tablet 2   traMADol (ULTRAM) 50 MG tablet Take 1 tablet (50 mg total) by mouth every 6 (six) hours as needed (may take one or two tablets every six hrs prn). (Patient not taking: Reported on 06/13/2018) 30 tablet 0   No current facility-administered medications for this visit.     SURGICAL HISTORY:  Past Surgical History:  Procedure Laterality Date   ANTERIOR CRUCIATE LIGAMENT REPAIR Left 1967   CARDIAC CATHETERIZATION  2000   CHEST TUBE INSERTION Right 10/19/2012   post bronch   COLONOSCOPY W/ POLYPECTOMY     CORONARY ARTERY BYPASS GRAFT N/A 03/24/2017   Procedure: CORONARY ARTERY BYPASS GRAFTING (CABG)x1 using left internal mammary artery, [name]-LAD;  Surgeon: Rexene Alberts, MD;  Location: [facility];  Service: Open Heart Surgery;  Laterality: N/A;   FLEXIBLE BRONCHOSCOPY  10/19/2012   Flexible video fiberoptic bronchoscopy with electromagnetic navigation and biopsies.   INTRAVASCULAR PRESSURE WIRE/FFR STUDY N/A 08/11/2016   Procedure: Intravascular Pressure Wire/FFR Study;  Surgeon: [name], MD;  Location: [facility];  Service: Cardiovascular;  Laterality: N/A;   IR ANGIOGRAM PULMONARY BILATERAL SELECTIVE  02/06/2018   IR ANGIOGRAM SELECTIVE EACH ADDITIONAL VESSEL  02/06/2018   IR ANGIOGRAM SELECTIVE EACH ADDITIONAL VESSEL  02/06/2018   IR INFUSION THROMBOL ARTERIAL INITIAL (MS)  02/06/2018   IR INFUSION THROMBOL ARTERIAL INITIAL (MS)  02/06/2018   IR THROMB F/U EVAL ART/VEN FINAL DAY (MS)  02/07/2018   IR US GUIDE VASC ACCESS RIGHT   02/06/2018   LOBECTOMY Right 09/02/2016   Procedure: RIGHT UPPER LOBECTOMY;  Surgeon: Melrose Nakayama, MD;  Location: [facility];  Service: Thoracic;  Laterality: Right;   LYMPH NODE DISSECTION Right 09/02/2016   Procedure: LYMPH NODE DISSECTION, RIGHT LUNG;  Surgeon: Melrose Nakayama, MD;  Location: [facility];  Service: Thoracic;  Laterality: Right;   MITRAL VALVE REPAIR N/A 03/24/2017   Procedure: MITRAL VALVE REPAIR (MVR) with Sorin Carbomedics Annuloflex size 28;  Surgeon: Rexene Alberts, MD;  Location: [facility];  Service: Open Heart Surgery;  Laterality: N/A;   RIGHT/LEFT HEART CATH AND CORONARY ANGIOGRAPHY N/A 08/11/2016   Procedure: Right/Left Heart Cath and Coronary Angiography;  Surgeon: [name], MD;  Location: [facility];  Service: Cardiovascular;  Laterality: N/A;   TEE WITHOUT CARDIOVERSION N/A 07/17/2016   Procedure: TRANSESOPHAGEAL ECHOCARDIOGRAM (TEE);  Surgeon: [name], MD;  Location: [facility];  Service: Cardiovascular;  Laterality: N/A;   TEE WITHOUT CARDIOVERSION N/A 03/24/2017   Procedure: TRANSESOPHAGEAL ECHOCARDIOGRAM (TEE);  Surgeon: Rexene Alberts, MD;  Location: [facility];  Service: Open Heart Surgery;  Laterality: N/A;   TONSILLECTOMY  1950's   TOTAL HIP ARTHROPLASTY Left 10/05/2014   dr [name]   TOTAL HIP ARTHROPLASTY Left 10/05/2014   Procedure: LEFT TOTAL HIP ARTHROPLASTY ANTERIOR APPROACH;  Surgeon: [name], MD;  Location: [facility];  Service: Orthopedics;  Laterality: Left;   VIDEO ASSISTED THORACOSCOPY (VATS)/WEDGE RESECTION Right 09/02/2016   Procedure: VIDEO ASSISTED THORACOSCOPY (VATS)/RIGHT UPPER LOBE WEDGE RESECTION;  Surgeon: Melrose Nakayama, MD;  Location: [facility];  Service: Thoracic;  Laterality: Right;   VIDEO BRONCHOSCOPY WITH ENDOBRONCHIAL NAVIGATION N/A 10/19/2012   Procedure: VIDEO BRONCHOSCOPY WITH ENDOBRONCHIAL NAVIGATION;  Surgeon: [name], MD;  Location: [facility];  Service: Thoracic;  Laterality: N/A;   WRIST  RECONSTRUCTION Left 12/2009   'proximal row carpectomy" [name]    REVIEW OF SYSTEMS:  A comprehensive review of systems was negative except for: Constitutional: positive for fatigue Respiratory: positive for dyspnea on exertion   PHYSICAL EXAMINATION: General appearance: alert, cooperative, fatigued and no distress Head: Normocephalic, without obvious abnormality, atraumatic Neck: no adenopathy, no JVD, supple, symmetrical, trachea midline and thyroid not enlarged, symmetric, no tenderness/mass/nodules Lymph nodes: Cervical, supraclavicular, and axillary nodes normal. Resp: clear to auscultation bilaterally Back: symmetric, no curvature. ROM normal. No CVA tenderness. Cardio: regular rate and rhythm, S1, S2 normal, no murmur, click, rub or gallop GI: soft, non-tender; bowel sounds normal; no masses,  no organomegaly Extremities: extremities normal, atraumatic, no cyanosis or edema  ECOG PERFORMANCE STATUS: 1 - Symptomatic but completely ambulatory  Blood pressure (!) 149/81, pulse 85, temperature 98.2 F (36.8 C), temperature source Oral, resp. rate 18, height 5' 9"  (1.753 m), weight 226 lb 14.4 oz (102.9 kg), SpO2 97 %.  LABORATORY DATA: Lab Results  Component Value Date   WBC 5.7 09/26/2018   HGB 15.1 09/26/2018   HCT 46.2 09/26/2018   MCV 95.5 09/26/2018   PLT 138 (L) 09/26/2018      Chemistry      Component Value Date/Time   NA 139 08/12/2018 1118   NA 142 10/01/2016 1534   K 4.2 08/12/2018 1118   K 4.2 10/01/2016 1534   CL 107 08/12/2018 1118   CO2 23 08/12/2018 1118   CO2 28 10/01/2016 1534   BUN 16 08/12/2018 1118   BUN 15.9 10/01/2016 1534   CREATININE 1.29 (H) 08/12/2018 1118   CREATININE 1.10 02/08/2017 1101   CREATININE 1.5 (H) 10/01/2016 1534      Component Value Date/Time   CALCIUM 9.2 08/12/2018 1118   CALCIUM 9.6 10/01/2016 1534   ALKPHOS 52 08/12/2018 1118   ALKPHOS 65 10/01/2016 1534   AST 30 08/12/2018 1118   AST 20 10/01/2016 1534   ALT 36  08/12/2018 1118   ALT 24 10/01/2016 1534   BILITOT 0.6 08/12/2018 1118   BILITOT 0.46 10/01/2016 1534       RADIOGRAPHIC STUDIES: No results found.  ASSESSMENT AND PLAN: This is a very pleasant 68 years old white male with a stage Ia non-small cell lung cancer status post right upper lobectomy with lymph node dissection on September 02, 2016. The patient he was on observation since that time. Unfortunately there are some concerning findings with nodular density in the posterior right lower lobe as well as increase and right pleural effusion suspicious for disease recurrence. He underwent ultrasound-guided right thoracentesis and  the cytology of the pleural fluid was consistent with recurrent adenocarcinoma. He had molecular studies performed by foundation 1 that showed positive EGFR mutation with deletion 1. The patient was started on treatment with Tagrisso 80 mg p.o. daily on 11/05/2017.  He status post 11 months of treatment. I recommended for the patient to continue his current treatment with Tagrisso with the same dose. I will see him back for follow-up visit in 6 weeks for evaluation with repeat CT scan of the chest, abdomen and pelvis for restaging of his disease. For the hypogonadism and erectile dysfunction, the patient was advised to discuss with his primary care physician.  He may need referral to urology. He was advised to call immediately if he has any concerning symptoms in the interval. The patient voices understanding of current disease status and treatment options and is in agreement with the current care plan. All questions were answered. The patient knows to call the clinic with any problems, questions or concerns. We can certainly see the patient much sooner if necessary.  Disclaimer: This note was dictated with voice recognition software. Similar sounding words can inadvertently be transcribed and may not be corrected upon review.

## 2018-10-17 ENCOUNTER — Other Ambulatory Visit: Payer: Self-pay | Admitting: Internal Medicine

## 2018-10-17 DIAGNOSIS — C3491 Malignant neoplasm of unspecified part of right bronchus or lung: Secondary | ICD-10-CM

## 2018-10-27 MED FILL — TAGRISSO 80 MG TABLET: 80 | 30 days supply | Qty: 30 | Fill #0

## 2018-10-28 ENCOUNTER — Other Ambulatory Visit: Payer: Medicare Other

## 2018-11-04 ENCOUNTER — Inpatient Hospital Stay: Payer: Medicare Other | Attending: Internal Medicine

## 2018-11-04 ENCOUNTER — Ambulatory Visit (HOSPITAL_COMMUNITY)
Admission: RE | Admit: 2018-11-04 | Discharge: 2018-11-04 | Disposition: A | Discharge status: Home / Self Care | Payer: Medicare Other | Source: Ambulatory Visit | Attending: Internal Medicine | Admitting: Internal Medicine

## 2018-11-04 ENCOUNTER — Other Ambulatory Visit: Payer: Medicare Other

## 2018-11-04 ENCOUNTER — Encounter (HOSPITAL_COMMUNITY): Payer: Self-pay

## 2018-11-04 ENCOUNTER — Other Ambulatory Visit: Payer: Self-pay

## 2018-11-04 DIAGNOSIS — C3411 Malignant neoplasm of upper lobe, right bronchus or lung: Secondary | ICD-10-CM | POA: Insufficient documentation

## 2018-11-04 DIAGNOSIS — I110 Hypertensive heart disease with heart failure: Secondary | ICD-10-CM | POA: Diagnosis not present

## 2018-11-04 DIAGNOSIS — I5032 Chronic diastolic (congestive) heart failure: Secondary | ICD-10-CM | POA: Insufficient documentation

## 2018-11-04 DIAGNOSIS — C3490 Malignant neoplasm of unspecified part of unspecified bronchus or lung: Secondary | ICD-10-CM

## 2018-11-04 DIAGNOSIS — E119 Type 2 diabetes mellitus without complications: Secondary | ICD-10-CM | POA: Insufficient documentation

## 2018-11-04 DIAGNOSIS — E669 Obesity, unspecified: Secondary | ICD-10-CM | POA: Diagnosis not present

## 2018-11-04 LAB — CBC WITH DIFFERENTIAL (CANCER CENTER ONLY)
Abs Immature Granulocytes: 0.03 10*3/uL (ref 0.00–0.07)
Basophils Absolute: 0 10*3/uL (ref 0.0–0.1)
Basophils Relative: 1 %
Eosinophils Absolute: 0.1 10*3/uL (ref 0.0–0.5)
Eosinophils Relative: 2 %
HCT: 48.4 % (ref 39.0–52.0)
Hemoglobin: 15.9 g/dL (ref 13.0–17.0)
Immature Granulocytes: 1 %
Lymphocytes Relative: 19 %
Lymphs Abs: 1.3 10*3/uL (ref 0.7–4.0)
MCH: 31 pg (ref 26.0–34.0)
MCHC: 32.9 g/dL (ref 30.0–36.0)
MCV: 94.3 fL (ref 80.0–100.0)
Monocytes Absolute: 0.7 10*3/uL (ref 0.1–1.0)
Monocytes Relative: 11 %
Neutro Abs: 4.5 10*3/uL (ref 1.7–7.7)
Neutrophils Relative %: 66 %
Platelet Count: 131 10*3/uL — ABNORMAL LOW (ref 150–400)
RBC: 5.13 MIL/uL (ref 4.22–5.81)
RDW: 14 % (ref 11.5–15.5)
WBC Count: 6.6 10*3/uL (ref 4.0–10.5)
nRBC: 0 % (ref 0.0–0.2)

## 2018-11-04 LAB — CMP (CANCER CENTER ONLY)
ALT: 35 U/L (ref 0–44)
AST: 32 U/L (ref 15–41)
Albumin: 4.1 g/dL (ref 3.5–5.0)
Alkaline Phosphatase: 55 U/L (ref 38–126)
Anion gap: 9 (ref 5–15)
BUN: 19 mg/dL (ref 8–23)
CO2: 24 mmol/L (ref 22–32)
Calcium: 9.1 mg/dL (ref 8.9–10.3)
Chloride: 107 mmol/L (ref 98–111)
Creatinine: 1.45 mg/dL — ABNORMAL HIGH (ref 0.61–1.24)
GFR, Est AFR Am: 57 mL/min — ABNORMAL LOW (ref >60)
GFR, Estimated: 49 mL/min — ABNORMAL LOW (ref >60)
Glucose, Bld: 121 mg/dL — ABNORMAL HIGH (ref 70–99)
Potassium: 4.3 mmol/L (ref 3.5–5.1)
Sodium: 140 mmol/L (ref 135–145)
Total Bilirubin: 0.5 mg/dL (ref 0.3–1.2)
Total Protein: 7.4 g/dL (ref 6.5–8.1)

## 2018-11-04 MED ORDER — IOHEXOL 300 MG/ML  SOLN
100.0000 mL | Freq: Once | INTRAMUSCULAR | Status: AC | PRN
  Administered 2018-11-04: 100 mL via INTRAVENOUS

## 2018-11-04 MED ORDER — SODIUM CHLORIDE (PF) 0.9 % IJ SOLN
INTRAMUSCULAR | Status: AC
  Filled 2018-11-04: qty 50

## 2018-11-07 ENCOUNTER — Other Ambulatory Visit: Payer: Self-pay

## 2018-11-07 ENCOUNTER — Telehealth: Payer: Self-pay | Admitting: Internal Medicine

## 2018-11-07 ENCOUNTER — Encounter: Payer: Self-pay | Admitting: *Deleted

## 2018-11-07 ENCOUNTER — Encounter: Payer: Self-pay | Admitting: Internal Medicine

## 2018-11-07 ENCOUNTER — Inpatient Hospital Stay: Payer: Medicare Other | Admitting: Internal Medicine

## 2018-11-07 VITALS — BP 140/81 | HR 70 | Temp 97.8°F | Resp 20 | Ht 69.0 in | Wt 229.2 lb

## 2018-11-07 DIAGNOSIS — I110 Hypertensive heart disease with heart failure: Secondary | ICD-10-CM

## 2018-11-07 DIAGNOSIS — I5032 Chronic diastolic (congestive) heart failure: Secondary | ICD-10-CM | POA: Diagnosis not present

## 2018-11-07 DIAGNOSIS — Z5111 Encounter for antineoplastic chemotherapy: Secondary | ICD-10-CM

## 2018-11-07 DIAGNOSIS — E119 Type 2 diabetes mellitus without complications: Secondary | ICD-10-CM | POA: Diagnosis not present

## 2018-11-07 DIAGNOSIS — C3411 Malignant neoplasm of upper lobe, right bronchus or lung: Secondary | ICD-10-CM

## 2018-11-07 DIAGNOSIS — E669 Obesity, unspecified: Secondary | ICD-10-CM

## 2018-11-07 DIAGNOSIS — C3491 Malignant neoplasm of unspecified part of right bronchus or lung: Secondary | ICD-10-CM

## 2018-11-07 NOTE — Progress Notes (Signed)
Hersey [name]   [phone]) [phone]  OFFICE PROGRESS NOTE  James Lima, MD [address] [facility] [address] [place] [address]  DIAGNOSIS: Recurrent non-small cell lung cancer, adenocarcinoma initially diagnosed as stage IA (T1c, N0, M0) non-small cell lung cancer, adenocarcinoma presented with right upper lobe lung nodule in April 2018 with recurrence in June 2019.  Biomarker Findings Microsatellite status - MS-Stable Tumor Mutational Burden - TMB-Low (3 Muts/Mb) Genomic Findings For a complete list of the genes assayed, please refer to the Appendix. EGFR exon 19 deletion ([id number]) CDKN2A/B loss NKX2-1 amplification 7 Disease relevant genes with no reportable alterations: KRAS, ALK, BRAF, MET, RET, ERBB2, ROS1   PRIOR THERAPY: Status post right upper lobectomy with lymph node dissection under the care of Dr. [name] on 09/02/2016.  CURRENT THERAPY: Tagrisso 80 mg p.o. daily.  First dose started November 05, 2017.  Status post 12 months of treatment.  INTERVAL HISTORY: James [name] 68 y.o. male returns to the clinic today for follow-up visit.  The patient is feeling fine today with no concerning complaints except for shortness of breath with exertion.  He denied having any chest pain, cough or hemoptysis.  The patient denied having any nausea, vomiting, diarrhea or constipation.  He has no headache or visual changes.  He denied having any recent weight loss or night sweats.  He has no fever or chills.  He has been tolerating his treatment with Tagrisso fairly well.  The patient had repeat CT scan of the chest, abdomen pelvis performed recently and he is here for evaluation and discussion of his scan results.  MEDICAL HISTORY: Past Medical History:  Diagnosis Date   Adenocarcinoma of right lung, stage 1 ([place]) 09/06/2016   Anxiety    Arthritis    "knees, hips, back" (10/19/2012)   Chronic diastolic congestive heart failure (HCC)      Chronic lower back pain    Colon polyps    10/27/2002, repeat letter 09/17/2007   Coronary artery disease    Coronary artery disease involving native coronary artery of native heart without angina pectoris    Depressive disorder, not elsewhere classified    no meds   Diabetes mellitus without complication ([place])    diet controlled- no med  (while in hosp 4/18 -elevated cbg   Dyspnea    Fasting hyperglycemia    GERD (gastroesophageal reflux disease)    Heart murmur    Hemoptysis    abnormal CT Chest 01/29/10 - ? new GG changes RUL > not viz on plain cxr 02/26/2010   Hypertension    Mitral regurgitation    severe MR 08/2016   MPN (myeloproliferative neoplasm) ([place])    1st detected 06/04/1998   Obesity    OSA on CPAP    last sleep study 10 years ago   Other and unspecified hyperlipidemia    Peripheral vascular disease ([place]) 08/2016   after lung surgey small clots in lungs,after hip dvt-5/16   Pneumonia    4/18   Positive PPD 1965   "non reactive in 2012" (10/19/2012)   Routine general medical examination at a health care facility    S/P CABG x 1 03/24/2017   [name] to LAD   S/P mitral valve repair 03/24/2017   Complex valvuloplasty including artificial Gore-tex neochord placement x6 and 28 mm Sorin Annuloflex posterior annuloplasty band   Special screening for malignant neoplasm of prostate    Spinal stenosis, unspecified region other than cervical  Wrist pain, left     ALLERGIES:  is allergic to symbicort [budesonide-formoterol fumarate] and amoxicillin.  MEDICATIONS:  Current Outpatient Medications  Medication Sig Dispense Refill   furosemide (LASIX) 40 MG tablet Take 1 tablet (40 mg total) by mouth as needed for fluid.     metoprolol tartrate (LOPRESSOR) 25 MG tablet TAKE 1/2 TABLET BY MOUTH 2 TIMES DAILY 90 tablet 3   Multiple Vitamin (MULTIVITAMIN) tablet Take 1 tablet by mouth daily.     Omega-3 Fatty Acids (FISH OIL) 500 MG CAPS Take 500 mg  by mouth daily.      potassium chloride SA (K-DUR,KLOR-CON) 20 MEQ tablet Take 1 tablet (20 mEq total) by mouth daily as needed. With lasix 90 tablet 0   rivaroxaban (XARELTO) 20 MG TABS tablet Take 1 tablet (20 mg total) by mouth daily with supper. 90 tablet 3   TAGRISSO 80 MG tablet TAKE 1 TABLET (80 MG TOTAL) BY MOUTH DAILY. 30 tablet 2   traMADol (ULTRAM) 50 MG tablet Take 1 tablet (50 mg total) by mouth every 6 (six) hours as needed (may take one or two tablets every six hrs prn). 30 tablet 0   XARELTO 20 MG TABS tablet      No current facility-administered medications for this visit.     SURGICAL HISTORY:  Past Surgical History:  Procedure Laterality Date   ANTERIOR CRUCIATE LIGAMENT REPAIR Left 1967   CARDIAC CATHETERIZATION  2000   CHEST TUBE INSERTION Right 10/19/2012   post bronch   COLONOSCOPY W/ POLYPECTOMY     CORONARY ARTERY BYPASS GRAFT N/A 03/24/2017   Procedure: CORONARY ARTERY BYPASS GRAFTING (CABG)x1 using left internal mammary artery, [name]-LAD;  Surgeon: Rexene Alberts, MD;  Location: [facility];  Service: Open Heart Surgery;  Laterality: N/A;   FLEXIBLE BRONCHOSCOPY  10/19/2012   Flexible video fiberoptic bronchoscopy with electromagnetic navigation and biopsies.   INTRAVASCULAR PRESSURE WIRE/FFR STUDY N/A 08/11/2016   Procedure: Intravascular Pressure Wire/FFR Study;  Surgeon: [name], MD;  Location: [facility];  Service: Cardiovascular;  Laterality: N/A;   IR ANGIOGRAM PULMONARY BILATERAL SELECTIVE  02/06/2018   IR ANGIOGRAM SELECTIVE EACH ADDITIONAL VESSEL  02/06/2018   IR ANGIOGRAM SELECTIVE EACH ADDITIONAL VESSEL  02/06/2018   IR INFUSION THROMBOL ARTERIAL INITIAL (MS)  02/06/2018   IR INFUSION THROMBOL ARTERIAL INITIAL (MS)  02/06/2018   IR THROMB F/U EVAL ART/VEN FINAL DAY (MS)  02/07/2018   IR US GUIDE VASC ACCESS RIGHT  02/06/2018   LOBECTOMY Right 09/02/2016   Procedure: RIGHT UPPER LOBECTOMY;  Surgeon: Melrose Nakayama, MD;   Location: [facility];  Service: Thoracic;  Laterality: Right;   LYMPH NODE DISSECTION Right 09/02/2016   Procedure: LYMPH NODE DISSECTION, RIGHT LUNG;  Surgeon: Melrose Nakayama, MD;  Location: [facility];  Service: Thoracic;  Laterality: Right;   MITRAL VALVE REPAIR N/A 03/24/2017   Procedure: MITRAL VALVE REPAIR (MVR) with Sorin Carbomedics Annuloflex size 28;  Surgeon: Rexene Alberts, MD;  Location: [facility];  Service: Open Heart Surgery;  Laterality: N/A;   RIGHT/LEFT HEART CATH AND CORONARY ANGIOGRAPHY N/A 08/11/2016   Procedure: Right/Left Heart Cath and Coronary Angiography;  Surgeon: [name], MD;  Location: [facility];  Service: Cardiovascular;  Laterality: N/A;   TEE WITHOUT CARDIOVERSION N/A 07/17/2016   Procedure: TRANSESOPHAGEAL ECHOCARDIOGRAM (TEE);  Surgeon: [name], MD;  Location: [facility];  Service: Cardiovascular;  Laterality: N/A;   TEE WITHOUT CARDIOVERSION N/A 03/24/2017   Procedure:  TRANSESOPHAGEAL ECHOCARDIOGRAM (TEE);  Surgeon: Rexene Alberts, MD;  Location: [facility];  Service: Open Heart Surgery;  Laterality: N/A;   TONSILLECTOMY  1950's   TOTAL HIP ARTHROPLASTY Left 10/05/2014   dr [name]   TOTAL HIP ARTHROPLASTY Left 10/05/2014   Procedure: LEFT TOTAL HIP ARTHROPLASTY ANTERIOR APPROACH;  Surgeon: [name], MD;  Location: [facility];  Service: Orthopedics;  Laterality: Left;   VIDEO ASSISTED THORACOSCOPY (VATS)/WEDGE RESECTION Right 09/02/2016   Procedure: VIDEO ASSISTED THORACOSCOPY (VATS)/RIGHT UPPER LOBE WEDGE RESECTION;  Surgeon: Melrose Nakayama, MD;  Location: [facility];  Service: Thoracic;  Laterality: Right;   VIDEO BRONCHOSCOPY WITH ENDOBRONCHIAL NAVIGATION N/A 10/19/2012   Procedure: VIDEO BRONCHOSCOPY WITH ENDOBRONCHIAL NAVIGATION;  Surgeon: [name], MD;  Location: [facility];  Service: Thoracic;  Laterality: N/A;   WRIST RECONSTRUCTION Left 12/2009   'proximal row carpectomy" [name]    REVIEW OF SYSTEMS:  Constitutional:  negative Eyes: negative Ears, nose, mouth, throat, and face: negative Respiratory: positive for dyspnea on exertion Cardiovascular: negative Gastrointestinal: negative Genitourinary:negative Integument/breast: negative Hematologic/lymphatic: negative Musculoskeletal:negative Neurological: negative Behavioral/Psych: negative Endocrine: negative Allergic/Immunologic: negative   PHYSICAL EXAMINATION: General appearance: alert, cooperative and no distress Head: Normocephalic, without obvious abnormality, atraumatic Neck: no adenopathy, no JVD, supple, symmetrical, trachea midline and thyroid not enlarged, symmetric, no tenderness/mass/nodules Lymph nodes: Cervical, supraclavicular, and axillary nodes normal. Resp: clear to auscultation bilaterally Back: symmetric, no curvature. ROM normal. No CVA tenderness. Cardio: regular rate and rhythm, S1, S2 normal, no murmur, click, rub or gallop GI: soft, non-tender; bowel sounds normal; no masses,  no organomegaly Extremities: extremities normal, atraumatic, no cyanosis or edema Neurologic: Alert and oriented X 3, normal strength and tone. Normal symmetric reflexes. Normal coordination and gait  ECOG PERFORMANCE STATUS: 1 - Symptomatic but completely ambulatory  Blood pressure 140/81, pulse 70, temperature 97.8 F (36.6 C), temperature source Oral, resp. rate 20, height _0  (1.753 m), weight 229 lb 3.2 oz (104 kg), SpO2 98 %.  LABORATORY DATA: Lab Results  Component Value Date   WBC 6.6 11/04/2018   HGB 15.9 11/04/2018   HCT 48.4 11/04/2018   MCV 94.3 11/04/2018   PLT 131 (L) 11/04/2018      Chemistry      Component Value Date/Time   NA 140 11/04/2018 0940   NA 142 10/01/2016 1534   K 4.3 11/04/2018 0940   K 4.2 10/01/2016 1534   CL 107 11/04/2018 0940   CO2 24 11/04/2018 0940   CO2 28 10/01/2016 1534   BUN 19 11/04/2018 0940   BUN 15.9 10/01/2016 1534   CREATININE 1.45 (H) 11/04/2018 0940   CREATININE 1.10 02/08/2017 1101    CREATININE 1.5 (H) 10/01/2016 1534      Component Value Date/Time   CALCIUM 9.1 11/04/2018 0940   CALCIUM 9.6 10/01/2016 1534   ALKPHOS 55 11/04/2018 0940   ALKPHOS 65 10/01/2016 1534   AST 32 11/04/2018 0940   AST 20 10/01/2016 1534   ALT 35 11/04/2018 0940   ALT 24 10/01/2016 1534   BILITOT 0.5 11/04/2018 0940   BILITOT 0.46 10/01/2016 1534       RADIOGRAPHIC STUDIES: Ct Chest W Contrast  Result Date: 11/04/2018 CLINICAL DATA:  Chronic shortness of breath. Prior right upper lobectomy for lung cancer. EXAM: CT CHEST, ABDOMEN, AND PELVIS WITH CONTRAST TECHNIQUE: Multidetector CT imaging of the chest, abdomen and pelvis was performed following the standard protocol during bolus administration of intravenous contrast. CONTRAST:  160m OMNIPAQUE IOHEXOL 300 MG/ML  SOLN  COMPARISON:  08/12/2018 FINDINGS: CT CHEST FINDINGS Cardiovascular: Mitral and aortic valve calcifications. Prior CABG. Coronary, aortic arch, and branch vessel atherosclerotic vascular disease. Chronic attenuated size and some reduced density in the left lower lobe pulmonary artery, not changed from 2014, possibly related to remote chronic embolus. No acute or recent pulmonary embolus is identified; today's exam was not performed as a CT angiogram of the chest. Mediastinum/Nodes: Old granulomatous disease. Lungs/Pleura: Right upper lobectomy. Stable branching density medially in the left upper lobe for example on image 52/7, likely a mucocele. Nodular scar-like subpleural density in the left lower lobe with internal calcification, unchanged. Stable small right pleural effusion. Stable 5 mm right upper lobe pulmonary nodule on image 72/7. Musculoskeletal: Degenerative sternoclavicular arthropathy bilaterally. Lower cervical and thoracic spondylosis with multilevel bridging spurring. CT ABDOMEN PELVIS FINDINGS Hepatobiliary: Stable appearance of hepatic cysts. Gallbladder unremarkable. No biliary dilatation. Pancreas: Unremarkable  Spleen: Unremarkable Adrenals/Urinary Tract: Duplicated left renal collecting system. Partial obscuration of the urinary bladder and left distal ureters due to streak artifact from the patient's left hip implant. Adrenal glands normal. Left kidney lower pole cyst and left peripelvic cysts. Stomach/Bowel: Unremarkable Vascular/Lymphatic: Aortoiliac atherosclerotic vascular disease. Reproductive: The prostate gland measures 5.2 by 5.1 by 6.2 cm (volume = 86 cm^3). Other: No supplemental non-categorized findings. Musculoskeletal: Bridging spurring of the sacroiliac joints. Chronic calcification and part of the right rectus femoris muscle. Left total hip prosthesis. Lumbar spondylosis and degenerative disc disease along with congenitally short paddle pedicles. Multilevel impingement at all levels between L2 and S1. IMPRESSION: 1. No new or progressive findings to suggest active malignancy. 2. Chronic mucocele in the left upper lobe. 5 mm right lower lobe nodule appears chronically stable. 3. Stable small right pleural effusion. 4. Stable hepatic cysts. 5. Duplicated left renal collecting system. 6.  Aortic Atherosclerosis (ICD10-I70.0). 7. Prostatomegaly. 8. Multilevel lumbar impingement. Electronically Signed   By: Van Clines M.D.   On: 11/04/2018 12:03   Ct Abdomen Pelvis W Contrast  Result Date: 11/04/2018 CLINICAL DATA:  Chronic shortness of breath. Prior right upper lobectomy for lung cancer. EXAM: CT CHEST, ABDOMEN, AND PELVIS WITH CONTRAST TECHNIQUE: Multidetector CT imaging of the chest, abdomen and pelvis was performed following the standard protocol during bolus administration of intravenous contrast. CONTRAST:  121m OMNIPAQUE IOHEXOL 300 MG/ML  SOLN COMPARISON:  08/12/2018 FINDINGS: CT CHEST FINDINGS Cardiovascular: Mitral and aortic valve calcifications. Prior CABG. Coronary, aortic arch, and branch vessel atherosclerotic vascular disease. Chronic attenuated size and some reduced density in the  left lower lobe pulmonary artery, not changed from 2014, possibly related to remote chronic embolus. No acute or recent pulmonary embolus is identified; today's exam was not performed as a CT angiogram of the chest. Mediastinum/Nodes: Old granulomatous disease. Lungs/Pleura: Right upper lobectomy. Stable branching density medially in the left upper lobe for example on image 52/7, likely a mucocele. Nodular scar-like subpleural density in the left lower lobe with internal calcification, unchanged. Stable small right pleural effusion. Stable 5 mm right upper lobe pulmonary nodule on image 72/7. Musculoskeletal: Degenerative sternoclavicular arthropathy bilaterally. Lower cervical and thoracic spondylosis with multilevel bridging spurring. CT ABDOMEN PELVIS FINDINGS Hepatobiliary: Stable appearance of hepatic cysts. Gallbladder unremarkable. No biliary dilatation. Pancreas: Unremarkable Spleen: Unremarkable Adrenals/Urinary Tract: Duplicated left renal collecting system. Partial obscuration of the urinary bladder and left distal ureters due to streak artifact from the patient's left hip implant. Adrenal glands normal. Left kidney lower pole cyst and left peripelvic cysts. Stomach/Bowel: Unremarkable Vascular/Lymphatic: Aortoiliac atherosclerotic vascular disease. Reproductive:  The prostate gland measures 5.2 by 5.1 by 6.2 cm (volume = 86 cm^3). Other: No supplemental non-categorized findings. Musculoskeletal: Bridging spurring of the sacroiliac joints. Chronic calcification and part of the right rectus femoris muscle. Left total hip prosthesis. Lumbar spondylosis and degenerative disc disease along with congenitally short paddle pedicles. Multilevel impingement at all levels between L2 and S1. IMPRESSION: 1. No new or progressive findings to suggest active malignancy. 2. Chronic mucocele in the left upper lobe. 5 mm right lower lobe nodule appears chronically stable. 3. Stable small right pleural effusion. 4. Stable  hepatic cysts. 5. Duplicated left renal collecting system. 6.  Aortic Atherosclerosis (ICD10-I70.0). 7. Prostatomegaly. 8. Multilevel lumbar impingement. Electronically Signed   By: Van Clines M.D.   On: 11/04/2018 12:03    ASSESSMENT AND PLAN: This is a very pleasant 68 years old white male with a stage Ia non-small cell lung cancer status post right upper lobectomy with lymph node dissection on September 02, 2016. The patient he was on observation since that time. Unfortunately there are some concerning findings with nodular density in the posterior right lower lobe as well as increase and right pleural effusion suspicious for disease recurrence. He underwent ultrasound-guided right thoracentesis and the cytology of the pleural fluid was consistent with recurrent adenocarcinoma. He had molecular studies performed by [facility] 1 that showed positive EGFR mutation with deletion 9. The patient was started on treatment with Tagrisso 80 mg p.o. daily on 11/05/2017.  He status post 12 months of treatment. The patient has been tolerating this treatment well with no concerning adverse effects. He had repeat CT scan of the chest, abdomen pelvis performed recently.  I personally and independently reviewed the scans and discussed the results with the patient today. His a scan showed no concerning findings for disease progression. I recommended for the patient to continue his current treatment with Tagrisso with the same dose. I will see him back for follow-up visit in 6 weeks for evaluation with repeat blood work. He was advised to call immediately if he has any other concerning symptoms in the interval. The patient voices understanding of current disease status and treatment options and is in agreement with the current care plan. All questions were answered. The patient knows to call the clinic with any problems, questions or concerns. We can certainly see the patient much sooner if  necessary.  Disclaimer: This note was dictated with voice recognition software. Similar sounding words can inadvertently be transcribed and may not be corrected upon review.

## 2018-11-07 NOTE — Progress Notes (Signed)
Oncology Nurse Navigator Documentation  Oncology Nurse Navigator Flowsheets 11/07/2018  Navigator Location [facility]  Referral Date to RadOnc/MedOnc -  Navigator Encounter Type Follow-up Appt/spoke with patient today.  He is on oral biologic for treatment.  His plan will be a follow up in 6 weeks, he verbalized understanding of plan.    Telephone -  Abnormal Finding Date -  Confirmed Diagnosis Date -  Surgery Date -  Multidisiplinary Clinic Date -  Treatment Initiated Date -  Patient Visit Type Follow-up  Treatment Phase Treatment  Barriers/Navigation Needs Education  Education Other  Interventions Education  Coordination of Care -  Education Method Verbal  Support Groups/Services -  Acuity Level 1  Acuity Level 1 Minimal follow up required  Acuity Level 2 -  Time Spent with Patient 15

## 2018-11-07 NOTE — Telephone Encounter (Signed)
Scheduled appt per 6/29 los - pt wife is aware of appt date and time

## 2018-11-15 ENCOUNTER — Other Ambulatory Visit: Payer: Self-pay | Admitting: Cardiology

## 2018-11-15 MED ORDER — METOPROLOL TARTRATE 25 MG PO TABS: 12.5000 mg | ORAL_TABLET | Freq: Two times a day (BID) | ORAL | 1 refills | Status: DC

## 2018-11-15 NOTE — Telephone Encounter (Signed)
New Message     *STAT* If patient is at the pharmacy, call can be transferred to refill team.   1. Which medications need to be refilled? (please list name of each medication and dose if known) Metoprolol  and Xarelto  2. Which pharmacy/location (including street and city if local pharmacy) is medication to be sent to? [facility] on [address]  3. Do they need a 30 day or 90 day supply? 90 day supply

## 2018-11-15 NOTE — Telephone Encounter (Signed)
Rx(s) sent to pharmacy electronically.  

## 2018-11-16 MED ORDER — RIVAROXABAN 20 MG PO TABS: 20.0000 mg | ORAL_TABLET | Freq: Every day | ORAL | 1 refills | Status: DC

## 2018-11-22 MED FILL — TAGRISSO 80 MG TABLET: 80 | 30 days supply | Qty: 30 | Fill #1

## 2018-12-13 ENCOUNTER — Other Ambulatory Visit: Payer: Self-pay

## 2018-12-13 ENCOUNTER — Encounter: Payer: Self-pay | Admitting: Internal Medicine

## 2018-12-13 ENCOUNTER — Ambulatory Visit (INDEPENDENT_AMBULATORY_CARE_PROVIDER_SITE_OTHER): Payer: Medicare Other | Admitting: Internal Medicine

## 2018-12-13 ENCOUNTER — Other Ambulatory Visit (INDEPENDENT_AMBULATORY_CARE_PROVIDER_SITE_OTHER): Payer: Medicare Other

## 2018-12-13 VITALS — BP 146/82 | HR 60 | Temp 98.7°F | Resp 16 | Ht 69.0 in | Wt 230.0 lb

## 2018-12-13 DIAGNOSIS — N522 Drug-induced erectile dysfunction: Secondary | ICD-10-CM | POA: Insufficient documentation

## 2018-12-13 DIAGNOSIS — N138 Other obstructive and reflux uropathy: Secondary | ICD-10-CM

## 2018-12-13 DIAGNOSIS — N183 Chronic kidney disease, stage 3 unspecified: Secondary | ICD-10-CM | POA: Insufficient documentation

## 2018-12-13 DIAGNOSIS — N401 Enlarged prostate with lower urinary tract symptoms: Secondary | ICD-10-CM

## 2018-12-13 DIAGNOSIS — E785 Hyperlipidemia, unspecified: Secondary | ICD-10-CM

## 2018-12-13 DIAGNOSIS — R001 Bradycardia, unspecified: Secondary | ICD-10-CM | POA: Diagnosis not present

## 2018-12-13 DIAGNOSIS — Z23 Encounter for immunization: Secondary | ICD-10-CM | POA: Diagnosis not present

## 2018-12-13 DIAGNOSIS — R600 Localized edema: Secondary | ICD-10-CM | POA: Diagnosis not present

## 2018-12-13 DIAGNOSIS — E118 Type 2 diabetes mellitus with unspecified complications: Secondary | ICD-10-CM | POA: Diagnosis not present

## 2018-12-13 DIAGNOSIS — Z0000 Encounter for general adult medical examination without abnormal findings: Secondary | ICD-10-CM

## 2018-12-13 DIAGNOSIS — I110 Hypertensive heart disease with heart failure: Secondary | ICD-10-CM | POA: Insufficient documentation

## 2018-12-13 DIAGNOSIS — I2510 Atherosclerotic heart disease of native coronary artery without angina pectoris: Secondary | ICD-10-CM

## 2018-12-13 LAB — TROPONIN I (HIGH SENSITIVITY): High Sens Troponin I: 7 ng/L (ref 2–17)

## 2018-12-13 LAB — URINALYSIS, ROUTINE W REFLEX MICROSCOPIC
Bilirubin Urine: NEGATIVE
Hgb urine dipstick: NEGATIVE
Ketones, ur: NEGATIVE
Leukocytes,Ua: NEGATIVE
Nitrite: NEGATIVE
RBC / HPF: NONE SEEN (ref 0–?)
Specific Gravity, Urine: 1.025 (ref 1.000–1.030)
Total Protein, Urine: NEGATIVE
Urine Glucose: NEGATIVE
Urobilinogen, UA: 0.2 (ref 0.0–1.0)
WBC, UA: NONE SEEN (ref 0–?)
pH: 5.5 (ref 5.0–8.0)

## 2018-12-13 LAB — LIPID PANEL
Cholesterol: 233 mg/dL — ABNORMAL HIGH (ref 0–200)
HDL: 46.3 mg/dL (ref >39.00)
LDL Cholesterol: 161 mg/dL — ABNORMAL HIGH (ref 0–99)
NonHDL: 187.12
Total CHOL/HDL Ratio: 5
Triglycerides: 130 mg/dL (ref 0.0–149.0)
VLDL: 26 mg/dL (ref 0.0–40.0)

## 2018-12-13 LAB — PSA: PSA: 1.6 ng/mL (ref 0.10–4.00)

## 2018-12-13 LAB — POCT GLYCOSYLATED HEMOGLOBIN (HGB A1C): Hemoglobin A1C: 5.9 % — AB (ref 4.0–5.6)

## 2018-12-13 LAB — TSH: TSH: 2.1 u[IU]/mL (ref 0.35–4.50)

## 2018-12-13 LAB — BRAIN NATRIURETIC PEPTIDE: Pro B Natriuretic peptide (BNP): 122 pg/mL — ABNORMAL HIGH (ref 0.0–100.0)

## 2018-12-13 MED ORDER — ROSUVASTATIN CALCIUM 40 MG PO TABS: 40.0000 mg | ORAL_TABLET | Freq: Every day | ORAL | 1 refills | Status: DC

## 2018-12-13 MED ORDER — TORSEMIDE 10 MG PO TABS: 10.0000 mg | ORAL_TABLET | Freq: Every day | ORAL | 1 refills | Status: DC

## 2018-12-13 MED ORDER — SILDENAFIL CITRATE 20 MG PO TABS: 80.0000 mg | ORAL_TABLET | Freq: Every day | ORAL | 5 refills | Status: DC | PRN

## 2018-12-13 NOTE — Patient Instructions (Signed)

## 2018-12-13 NOTE — Progress Notes (Signed)
Subjective:  Patient ID: James [name], male    DOB: 1950-11-27  Age: 68 y.o. MRN: [id number]  CC: Annual Exam, Hyperlipidemia, Diabetes, and Hypertension   HPI James [name] presents for a CPX.  It is difficult to get a specific history from him but it sounds like for the past few months he has developed worsening lower extremity edema, shortness of breath, DOE, and weight gain.  He denies chest pain or diaphoresis.  He has a prior history of LVH and CAD but no history of systolic or diastolic heart failure.  He was previously prescribed furosemide but he cannot clarify whether or not he has been taking that recently.  He is not currently taking a statin.  He does not monitor his blood pressure.  Outpatient Medications Prior to Visit  Medication Sig Dispense Refill   metoprolol tartrate (LOPRESSOR) 25 MG tablet Take 0.5 tablets (12.5 mg total) by mouth 2 (two) times daily. 90 tablet 1   Omega-3 Fatty Acids (FISH OIL) 500 MG CAPS Take 500 mg by mouth daily.      rivaroxaban (XARELTO) 20 MG TABS tablet Take 1 tablet (20 mg total) by mouth daily with supper. 90 tablet 1   TAGRISSO 80 MG tablet TAKE 1 TABLET (80 MG TOTAL) BY MOUTH DAILY. 30 tablet 2   TAGRISSO 80 MG tablet      Multiple Vitamin (MULTIVITAMIN) tablet Take 1 tablet by mouth daily.     potassium chloride SA (K-DUR,KLOR-CON) 20 MEQ tablet Take 1 tablet (20 mEq total) by mouth daily as needed. With lasix 90 tablet 0   furosemide (LASIX) 40 MG tablet Take 1 tablet (40 mg total) by mouth as needed for fluid. (Patient not taking: Reported on 12/13/2018)     traMADol (ULTRAM) 50 MG tablet Take 1 tablet (50 mg total) by mouth every 6 (six) hours as needed (may take one or two tablets every six hrs prn). (Patient not taking: Reported on 12/13/2018) 30 tablet 0   No facility-administered medications prior to visit.     ROS Review of Systems  Constitutional: Positive for fatigue and unexpected weight change. Negative for  appetite change and diaphoresis.  HENT: Negative.   Eyes: Negative for visual disturbance.  Respiratory: Positive for shortness of breath. Negative for cough, chest tightness and wheezing.   Cardiovascular: Positive for leg swelling. Negative for chest pain and palpitations.  Gastrointestinal: Negative for abdominal pain, diarrhea, nausea and vomiting.  Endocrine: Negative.   Genitourinary: Negative.  Negative for difficulty urinating, penile swelling, scrotal swelling and testicular pain.       +ED  Musculoskeletal: Negative.  Negative for arthralgias and myalgias.  Skin: Negative.  Negative for color change and pallor.  Neurological: Negative.  Negative for dizziness, weakness, light-headedness and headaches.  Hematological: Negative for adenopathy. Does not bruise/bleed easily.  Psychiatric/Behavioral: Negative.     Objective:  BP (!) 146/82 (BP Location: Left Arm, Patient Position: Sitting, Cuff Size: Large)    Pulse 60    Temp 98.7 F (37.1 C) (Oral)    Resp 16    Ht 5\' 9"  (1.753 m)    Wt 230 lb (104.3 kg)    SpO2 97%    BMI 33.97 kg/m   BP Readings from Last 3 Encounters:  12/13/18 (!) 146/82  11/07/18 140/81  09/26/18 (!) 149/81    Wt Readings from Last 3 Encounters:  12/13/18 230 lb (104.3 kg)  11/07/18 229 lb 3.2 oz (104 kg)  09/26/18 226 lb 14.4  oz (102.9 kg)    Physical Exam Vitals signs reviewed.  Constitutional:      General: He is not in acute distress.    Appearance: He is obese. He is not ill-appearing, toxic-appearing or diaphoretic.  HENT:     Nose: Nose normal.     Mouth/Throat:     Mouth: Mucous membranes are moist.  Eyes:     General: No scleral icterus.    Conjunctiva/sclera: Conjunctivae normal.  Cardiovascular:     Rate and Rhythm: Normal rate. Occasional extrasystoles are present.    Chest Wall: PMI is not displaced.     Heart sounds: Murmur present. Systolic murmur present with a grade of 1/6. No diastolic murmur. No gallop.      Comments:  EKG --  Sinus  Rhythm  - occasional ectopic ventricular beat    Low voltage with rightward P-axis and rotation -possible pulmonary disease.   ABNORMAL  Pulmonary:     Effort: Pulmonary effort is normal. No respiratory distress.     Breath sounds: No stridor. No wheezing, rhonchi or rales.  Abdominal:     General: Abdomen is protuberant. Bowel sounds are normal. There is no distension.     Palpations: Abdomen is soft. There is no hepatomegaly, splenomegaly or mass.     Tenderness: There is no abdominal tenderness.     Hernia: No hernia is present. There is no hernia in the right inguinal area.  Genitourinary:    Pubic Area: No rash.      Penis: Normal and circumcised. No discharge, swelling or lesions.      Scrotum/Testes: Normal.        Right: Mass, tenderness or swelling not present.        Left: Mass, tenderness or swelling not present.     Epididymis:     Right: Not inflamed or enlarged. No mass or tenderness.     Left: Not enlarged. No tenderness.     Prostate: Enlarged (1+ smooth symm BPH). Not tender and no nodules present.     Rectum: Guaiac result negative. External hemorrhoid and internal hemorrhoid present. No mass, tenderness or anal fissure. Normal anal tone.  Musculoskeletal:     Right lower leg: 2+ Pitting Edema present.     Left lower leg: 2+ Pitting Edema present.  Lymphadenopathy:     Lower Body: No right inguinal adenopathy. No left inguinal adenopathy.  Skin:    General: Skin is warm and dry.  Neurological:     General: No focal deficit present.     Mental Status: He is oriented to person, place, and time. Mental status is at baseline.  Psychiatric:        Mood and Affect: Mood normal.        Behavior: Behavior normal.     Lab Results  Component Value Date   WBC 6.6 11/04/2018   HGB 15.9 11/04/2018   HCT 48.4 11/04/2018   PLT 131 (L) 11/04/2018   GLUCOSE 121 (H) 11/04/2018   CHOL 233 (H) 12/13/2018   TRIG 130.0 12/13/2018   HDL 46.30 12/13/2018    LDLDIRECT 177.0 07/24/2015   LDLCALC 161 (H) 12/13/2018   ALT 35 11/04/2018   AST 32 11/04/2018   NA 140 11/04/2018   K 4.3 11/04/2018   CL 107 11/04/2018   CREATININE 1.45 (H) 11/04/2018   BUN 19 11/04/2018   CO2 24 11/04/2018   TSH 2.10 12/13/2018   PSA 1.60 12/13/2018   INR 1.28 02/07/2018   HGBA1C 5.9 (  A) 12/13/2018   MICROALBUR 0.7 03/17/2018    Ct Chest W Contrast  Result Date: 11/04/2018 CLINICAL DATA:  Chronic shortness of breath. Prior right upper lobectomy for lung cancer. EXAM: CT CHEST, ABDOMEN, AND PELVIS WITH CONTRAST TECHNIQUE: Multidetector CT imaging of the chest, abdomen and pelvis was performed following the standard protocol during bolus administration of intravenous contrast. CONTRAST:  130mL OMNIPAQUE IOHEXOL 300 MG/ML  SOLN COMPARISON:  08/12/2018 FINDINGS: CT CHEST FINDINGS Cardiovascular: Mitral and aortic valve calcifications. Prior CABG. Coronary, aortic arch, and branch vessel atherosclerotic vascular disease. Chronic attenuated size and some reduced density in the left lower lobe pulmonary artery, not changed from 2014, possibly related to remote chronic embolus. No acute or recent pulmonary embolus is identified; today's exam was not performed as a CT angiogram of the chest. Mediastinum/Nodes: Old granulomatous disease. Lungs/Pleura: Right upper lobectomy. Stable branching density medially in the left upper lobe for example on image 52/7, likely a mucocele. Nodular scar-like subpleural density in the left lower lobe with internal calcification, unchanged. Stable small right pleural effusion. Stable 5 mm right upper lobe pulmonary nodule on image 72/7. Musculoskeletal: Degenerative sternoclavicular arthropathy bilaterally. Lower cervical and thoracic spondylosis with multilevel bridging spurring. CT ABDOMEN PELVIS FINDINGS Hepatobiliary: Stable appearance of hepatic cysts. Gallbladder unremarkable. No biliary dilatation. Pancreas: Unremarkable Spleen: Unremarkable  Adrenals/Urinary Tract: Duplicated left renal collecting system. Partial obscuration of the urinary bladder and left distal ureters due to streak artifact from the patient's left hip implant. Adrenal glands normal. Left kidney lower pole cyst and left peripelvic cysts. Stomach/Bowel: Unremarkable Vascular/Lymphatic: Aortoiliac atherosclerotic vascular disease. Reproductive: The prostate gland measures 5.2 by 5.1 by 6.2 cm (volume = 86 cm^3). Other: No supplemental non-categorized findings. Musculoskeletal: Bridging spurring of the sacroiliac joints. Chronic calcification and part of the right rectus femoris muscle. Left total hip prosthesis. Lumbar spondylosis and degenerative disc disease along with congenitally short paddle pedicles. Multilevel impingement at all levels between L2 and S1. IMPRESSION: 1. No new or progressive findings to suggest active malignancy. 2. Chronic mucocele in the left upper lobe. 5 mm right lower lobe nodule appears chronically stable. 3. Stable small right pleural effusion. 4. Stable hepatic cysts. 5. Duplicated left renal collecting system. 6.  Aortic Atherosclerosis (ICD10-I70.0). 7. Prostatomegaly. 8. Multilevel lumbar impingement. Electronically Signed   By: Van Clines M.D.   On: 11/04/2018 12:03   Ct Abdomen Pelvis W Contrast  Result Date: 11/04/2018 CLINICAL DATA:  Chronic shortness of breath. Prior right upper lobectomy for lung cancer. EXAM: CT CHEST, ABDOMEN, AND PELVIS WITH CONTRAST TECHNIQUE: Multidetector CT imaging of the chest, abdomen and pelvis was performed following the standard protocol during bolus administration of intravenous contrast. CONTRAST:  146mL OMNIPAQUE IOHEXOL 300 MG/ML  SOLN COMPARISON:  08/12/2018 FINDINGS: CT CHEST FINDINGS Cardiovascular: Mitral and aortic valve calcifications. Prior CABG. Coronary, aortic arch, and branch vessel atherosclerotic vascular disease. Chronic attenuated size and some reduced density in the left lower lobe  pulmonary artery, not changed from 2014, possibly related to remote chronic embolus. No acute or recent pulmonary embolus is identified; today's exam was not performed as a CT angiogram of the chest. Mediastinum/Nodes: Old granulomatous disease. Lungs/Pleura: Right upper lobectomy. Stable branching density medially in the left upper lobe for example on image 52/7, likely a mucocele. Nodular scar-like subpleural density in the left lower lobe with internal calcification, unchanged. Stable small right pleural effusion. Stable 5 mm right upper lobe pulmonary nodule on image 72/7. Musculoskeletal: Degenerative sternoclavicular arthropathy bilaterally. Lower cervical and thoracic  spondylosis with multilevel bridging spurring. CT ABDOMEN PELVIS FINDINGS Hepatobiliary: Stable appearance of hepatic cysts. Gallbladder unremarkable. No biliary dilatation. Pancreas: Unremarkable Spleen: Unremarkable Adrenals/Urinary Tract: Duplicated left renal collecting system. Partial obscuration of the urinary bladder and left distal ureters due to streak artifact from the patient's left hip implant. Adrenal glands normal. Left kidney lower pole cyst and left peripelvic cysts. Stomach/Bowel: Unremarkable Vascular/Lymphatic: Aortoiliac atherosclerotic vascular disease. Reproductive: The prostate gland measures 5.2 by 5.1 by 6.2 cm (volume = 86 cm^3). Other: No supplemental non-categorized findings. Musculoskeletal: Bridging spurring of the sacroiliac joints. Chronic calcification and part of the right rectus femoris muscle. Left total hip prosthesis. Lumbar spondylosis and degenerative disc disease along with congenitally short paddle pedicles. Multilevel impingement at all levels between L2 and S1. IMPRESSION: 1. No new or progressive findings to suggest active malignancy. 2. Chronic mucocele in the left upper lobe. 5 mm right lower lobe nodule appears chronically stable. 3. Stable small right pleural effusion. 4. Stable hepatic cysts. 5.  Duplicated left renal collecting system. 6.  Aortic Atherosclerosis (ICD10-I70.0). 7. Prostatomegaly. 8. Multilevel lumbar impingement. Electronically Signed   By: Van Clines M.D.   On: 11/04/2018 12:03    Assessment & Plan:   Datron was seen today for annual exam, hyperlipidemia, diabetes and hypertension.  Diagnoses and all orders for this visit:  Type II diabetes mellitus with manifestations ([place])- His blood sugar is adequately well controlled. -     POCT glycosylated hemoglobin (Hb A1C)  BPH with obstruction/lower urinary tract symptoms- His PSA is normal which is reassuring that he does not have prostate cancer.  He has no symptoms that need to be treated. -     PSA; Future  Hyperlipidemia with target LDL less than 130- He has not achieved his LDL goal.  I have asked him to start taking a statin for CV risk reduction. -     Lipid panel; Future -     TSH; Future -     rosuvastatin (CRESTOR) 40 MG tablet; Take 1 tablet (40 mg total) by mouth daily.  Routine health maintenance- Exam completed, labs reviewed, vaccines reviewed and updated, colon cancer screening is up-to-date, patient information about health maintenance was offered.  Chronic renal disease, stage 3, moderately decreased glomerular filtration rate (GFR) between 30-59 mL/min/1.73 square meter ([place])- He will avoid nephrotoxic agents.  Will continue to address his risk factors. -     Urinalysis, Routine w reflex microscopic; Future  Need for Tdap vaccination -     Tdap vaccine greater than or equal to 7yo IM  Bradycardia -     EKG 12-Lead  Bilateral leg edema- His d-dimer is not elevated considering his age.  I am not concerned about DVT.  His BNP is very slightly elevated.  I am concerned about the development of congestive heart failure.  I think torsemide would be of better loop diuretic for him. I have asked him to follow-up with his cardiologist as soon as possible. -     Brain natriuretic peptide; Future -      Urinalysis, Routine w reflex microscopic; Future -     D-dimer, quantitative (not at [facility]); Future -     Troponin I (High Sensitivity); Future -     torsemide (DEMADEX) 10 MG tablet; Take 1 tablet (10 mg total) by mouth daily. -     Ambulatory referral to Cardiology  LVH (left ventricular hypertrophy) due to hypertensive disease, with heart failure (HCC) -     torsemide (  DEMADEX) 10 MG tablet; Take 1 tablet (10 mg total) by mouth daily. -     Ambulatory referral to Cardiology  Drug-induced erectile dysfunction -     sildenafil (REVATIO) 20 MG tablet; Take 4 tablets (80 mg total) by mouth daily as needed.  Coronary artery disease involving native coronary artery of native heart without angina pectoris -     rosuvastatin (CRESTOR) 40 MG tablet; Take 1 tablet (40 mg total) by mouth daily. -     Ambulatory referral to Cardiology   I have discontinued Jeneen Rinks A. Grandberry "Jim"'s potassium chloride SA, traMADol, furosemide, and multivitamin. I am also having him start on torsemide, sildenafil, and rosuvastatin. Additionally, I am having him maintain his Fish Oil, Tagrisso, metoprolol tartrate, rivaroxaban, and Tagrisso.  Meds ordered this encounter  Medications   torsemide (DEMADEX) 10 MG tablet    Sig: Take 1 tablet (10 mg total) by mouth daily.    Dispense:  90 tablet    Refill:  1   sildenafil (REVATIO) 20 MG tablet    Sig: Take 4 tablets (80 mg total) by mouth daily as needed.    Dispense:  60 tablet    Refill:  5   rosuvastatin (CRESTOR) 40 MG tablet    Sig: Take 1 tablet (40 mg total) by mouth daily.    Dispense:  90 tablet    Refill:  1     Follow-up: Return in about 3 months (around 03/15/2019).  [name], MD

## 2018-12-14 ENCOUNTER — Encounter: Payer: Self-pay | Admitting: Internal Medicine

## 2018-12-14 LAB — D-DIMER, QUANTITATIVE: D-Dimer, Quant: 0.68 mcg/mL FEU — ABNORMAL HIGH (ref <0.50)

## 2018-12-19 ENCOUNTER — Inpatient Hospital Stay: Payer: Medicare Other | Attending: Internal Medicine | Admitting: Internal Medicine

## 2018-12-19 ENCOUNTER — Inpatient Hospital Stay: Payer: Medicare Other

## 2018-12-19 ENCOUNTER — Encounter: Payer: Self-pay | Admitting: Internal Medicine

## 2018-12-19 ENCOUNTER — Other Ambulatory Visit: Payer: Self-pay

## 2018-12-19 VITALS — BP 138/69 | HR 66 | Temp 98.9°F | Resp 18 | Ht 69.0 in | Wt 230.1 lb

## 2018-12-19 DIAGNOSIS — E785 Hyperlipidemia, unspecified: Secondary | ICD-10-CM | POA: Insufficient documentation

## 2018-12-19 DIAGNOSIS — I10 Essential (primary) hypertension: Secondary | ICD-10-CM | POA: Insufficient documentation

## 2018-12-19 DIAGNOSIS — G4733 Obstructive sleep apnea (adult) (pediatric): Secondary | ICD-10-CM | POA: Diagnosis not present

## 2018-12-19 DIAGNOSIS — Z7901 Long term (current) use of anticoagulants: Secondary | ICD-10-CM | POA: Insufficient documentation

## 2018-12-19 DIAGNOSIS — Z902 Acquired absence of lung [part of]: Secondary | ICD-10-CM | POA: Diagnosis not present

## 2018-12-19 DIAGNOSIS — Z5111 Encounter for antineoplastic chemotherapy: Secondary | ICD-10-CM | POA: Diagnosis not present

## 2018-12-19 DIAGNOSIS — C3411 Malignant neoplasm of upper lobe, right bronchus or lung: Secondary | ICD-10-CM | POA: Diagnosis present

## 2018-12-19 DIAGNOSIS — E119 Type 2 diabetes mellitus without complications: Secondary | ICD-10-CM | POA: Insufficient documentation

## 2018-12-19 DIAGNOSIS — C3491 Malignant neoplasm of unspecified part of right bronchus or lung: Secondary | ICD-10-CM

## 2018-12-19 DIAGNOSIS — Z86718 Personal history of other venous thrombosis and embolism: Secondary | ICD-10-CM | POA: Diagnosis not present

## 2018-12-19 DIAGNOSIS — C3490 Malignant neoplasm of unspecified part of unspecified bronchus or lung: Secondary | ICD-10-CM | POA: Diagnosis not present

## 2018-12-19 DIAGNOSIS — Z951 Presence of aortocoronary bypass graft: Secondary | ICD-10-CM | POA: Diagnosis not present

## 2018-12-19 DIAGNOSIS — F419 Anxiety disorder, unspecified: Secondary | ICD-10-CM | POA: Insufficient documentation

## 2018-12-19 DIAGNOSIS — Z79899 Other long term (current) drug therapy: Secondary | ICD-10-CM | POA: Diagnosis not present

## 2018-12-19 DIAGNOSIS — F329 Major depressive disorder, single episode, unspecified: Secondary | ICD-10-CM | POA: Insufficient documentation

## 2018-12-19 LAB — CMP (CANCER CENTER ONLY)
ALT: 31 U/L (ref 0–44)
AST: 29 U/L (ref 15–41)
Albumin: 3.8 g/dL (ref 3.5–5.0)
Alkaline Phosphatase: 52 U/L (ref 38–126)
Anion gap: 9 (ref 5–15)
BUN: 21 mg/dL (ref 8–23)
CO2: 22 mmol/L (ref 22–32)
Calcium: 9.4 mg/dL (ref 8.9–10.3)
Chloride: 109 mmol/L (ref 98–111)
Creatinine: 1.46 mg/dL — ABNORMAL HIGH (ref 0.61–1.24)
GFR, Est AFR Am: 56 mL/min — ABNORMAL LOW (ref >60)
GFR, Estimated: 48 mL/min — ABNORMAL LOW (ref >60)
Glucose, Bld: 131 mg/dL — ABNORMAL HIGH (ref 70–99)
Potassium: 5.1 mmol/L (ref 3.5–5.1)
Sodium: 140 mmol/L (ref 135–145)
Total Bilirubin: 0.4 mg/dL (ref 0.3–1.2)
Total Protein: 6.9 g/dL (ref 6.5–8.1)

## 2018-12-19 LAB — CBC WITH DIFFERENTIAL (CANCER CENTER ONLY)
Abs Immature Granulocytes: 0.04 10*3/uL (ref 0.00–0.07)
Basophils Absolute: 0 10*3/uL (ref 0.0–0.1)
Basophils Relative: 1 %
Eosinophils Absolute: 0.1 10*3/uL (ref 0.0–0.5)
Eosinophils Relative: 2 %
HCT: 47.1 % (ref 39.0–52.0)
Hemoglobin: 15.6 g/dL (ref 13.0–17.0)
Immature Granulocytes: 1 %
Lymphocytes Relative: 22 %
Lymphs Abs: 1.3 10*3/uL (ref 0.7–4.0)
MCH: 31.3 pg (ref 26.0–34.0)
MCHC: 33.1 g/dL (ref 30.0–36.0)
MCV: 94.6 fL (ref 80.0–100.0)
Monocytes Absolute: 0.7 10*3/uL (ref 0.1–1.0)
Monocytes Relative: 12 %
Neutro Abs: 3.6 10*3/uL (ref 1.7–7.7)
Neutrophils Relative %: 62 %
Platelet Count: 132 10*3/uL — ABNORMAL LOW (ref 150–400)
RBC: 4.98 MIL/uL (ref 4.22–5.81)
RDW: 14.2 % (ref 11.5–15.5)
WBC Count: 5.8 10*3/uL (ref 4.0–10.5)
nRBC: 0 % (ref 0.0–0.2)

## 2018-12-19 NOTE — Progress Notes (Signed)
[place] [phone]   [phone]  OFFICE PROGRESS NOTE  [name], MD [address] [facility] [address] [place] [address]  DIAGNOSIS: Recurrent non-small cell lung cancer, adenocarcinoma initially diagnosed as stage IA (T1c, N0, M0) non-small cell lung cancer, adenocarcinoma presented with right upper lobe lung nodule in April 2018 with recurrence in June 2019.  Biomarker Findings Microsatellite status - MS-Stable Tumor Mutational Burden - TMB-Low (3 Muts/Mb) Genomic Findings For a complete list of the genes assayed, please refer to the Appendix. EGFR exon 19 deletion ([id number]>P) CDKN2A/B loss NKX2-1 amplification 7 Disease relevant genes with no reportable alterations: KRAS, ALK, BRAF, MET, RET, ERBB2, ROS1   PRIOR THERAPY: Status post right upper lobectomy with lymph node dissection under the care of Dr. [name] on 09/02/2016.  CURRENT THERAPY: Tagrisso 80 mg p.o. daily.  First dose started November 05, 2017.  Status post 12 months of treatment.  INTERVAL HISTORY: James [name] 68 y.o. male returns to the clinic today for follow-up visit.  The patient is feeling fine today with no concerning complaints except for shortness of breath with exertion.  He denied having any chest pain, cough or hemoptysis.  He denied having any skin rash or diarrhea.  He has no recent weight loss or night sweats.  He has no headache or visual changes.  He was seen recently by his primary care physician for annual physical exam.  The patient is here today for evaluation and repeat blood work.  MEDICAL HISTORY: Past Medical History:  Diagnosis Date   Adenocarcinoma of right lung, stage 1 ([place]) 09/06/2016   Anxiety    Arthritis    "knees, hips, back" (10/19/2012)   Chronic diastolic congestive heart failure (HCC)    Chronic lower back pain    Colon polyps    10/27/2002, repeat letter 09/17/2007   Coronary artery disease    Coronary artery  disease involving native coronary artery of native heart without angina pectoris    Depressive disorder, not elsewhere classified    no meds   Diabetes mellitus without complication ([place])    diet controlled- no med  (while in hosp 4/18 -elevated cbg   Dyspnea    Fasting hyperglycemia    GERD (gastroesophageal reflux disease)    Heart murmur    Hemoptysis    abnormal CT Chest 01/29/10 - ? new GG changes RUL > not viz on plain cxr 02/26/2010   Hypertension    Mitral regurgitation    severe MR 08/2016   MPN (myeloproliferative neoplasm) ([place])    1st detected 06/04/1998   Obesity    OSA on CPAP    last sleep study 10 years ago   Other and unspecified hyperlipidemia    Peripheral vascular disease ([place]) 08/2016   after lung surgey small clots in lungs,after hip dvt-5/16   Pneumonia    4/18   Positive PPD 1965   "non reactive in 2012" (10/19/2012)   Routine general medical examination at a health care facility    S/P CABG x 1 03/24/2017   LIMA to LAD   S/P mitral valve repair 03/24/2017   Complex valvuloplasty including artificial Gore-tex neochord placement x6 and 28 mm Sorin Annuloflex posterior annuloplasty band   Special screening for malignant neoplasm of prostate    Spinal stenosis, unspecified region other than cervical    Wrist pain, left     ALLERGIES:  is allergic to symbicort [budesonide-formoterol fumarate] and amoxicillin.  MEDICATIONS:  Current Outpatient Medications  Medication Sig Dispense Refill   metoprolol tartrate (LOPRESSOR) 25 MG tablet Take 0.5 tablets (12.5 mg total) by mouth 2 (two) times daily. 90 tablet 1   Omega-3 Fatty Acids (FISH OIL) 500 MG CAPS Take 500 mg by mouth daily.      rivaroxaban (XARELTO) 20 MG TABS tablet Take 1 tablet (20 mg total) by mouth daily with supper. 90 tablet 1   rosuvastatin (CRESTOR) 40 MG tablet Take 1 tablet (40 mg total) by mouth daily. 90 tablet 1   sildenafil (REVATIO) 20 MG tablet Take 4 tablets  (80 mg total) by mouth daily as needed. 60 tablet 5   TAGRISSO 80 MG tablet TAKE 1 TABLET (80 MG TOTAL) BY MOUTH DAILY. 30 tablet 2   TAGRISSO 80 MG tablet      torsemide (DEMADEX) 10 MG tablet Take 1 tablet (10 mg total) by mouth daily. 90 tablet 1   No current facility-administered medications for this visit.     SURGICAL HISTORY:  Past Surgical History:  Procedure Laterality Date   ANTERIOR CRUCIATE LIGAMENT REPAIR Left 1967   CARDIAC CATHETERIZATION  2000   CHEST TUBE INSERTION Right 10/19/2012   post bronch   COLONOSCOPY W/ POLYPECTOMY     CORONARY ARTERY BYPASS GRAFT N/A 03/24/2017   Procedure: CORONARY ARTERY BYPASS GRAFTING (CABG)x1 using left internal mammary artery, LIMA-LAD;  Surgeon: Rexene Alberts, MD;  Location: [facility];  Service: Open Heart Surgery;  Laterality: N/A;   FLEXIBLE BRONCHOSCOPY  10/19/2012   Flexible video fiberoptic bronchoscopy with electromagnetic navigation and biopsies.   INTRAVASCULAR PRESSURE WIRE/FFR STUDY N/A 08/11/2016   Procedure: Intravascular Pressure Wire/FFR Study;  Surgeon: [name], MD;  Location: [facility];  Service: Cardiovascular;  Laterality: N/A;   IR ANGIOGRAM PULMONARY BILATERAL SELECTIVE  02/06/2018   IR ANGIOGRAM SELECTIVE EACH ADDITIONAL VESSEL  02/06/2018   IR ANGIOGRAM SELECTIVE EACH ADDITIONAL VESSEL  02/06/2018   IR INFUSION THROMBOL ARTERIAL INITIAL (MS)  02/06/2018   IR INFUSION THROMBOL ARTERIAL INITIAL (MS)  02/06/2018   IR THROMB F/U EVAL ART/VEN FINAL DAY (MS)  02/07/2018   IR US GUIDE VASC ACCESS RIGHT  02/06/2018   LOBECTOMY Right 09/02/2016   Procedure: RIGHT UPPER LOBECTOMY;  Surgeon: Melrose Nakayama, MD;  Location: [facility];  Service: Thoracic;  Laterality: Right;   LYMPH NODE DISSECTION Right 09/02/2016   Procedure: LYMPH NODE DISSECTION, RIGHT LUNG;  Surgeon: Melrose Nakayama, MD;  Location: [facility];  Service: Thoracic;  Laterality: Right;   MITRAL VALVE REPAIR N/A 03/24/2017    Procedure: MITRAL VALVE REPAIR (MVR) with Sorin Carbomedics Annuloflex size 28;  Surgeon: Rexene Alberts, MD;  Location: [facility];  Service: Open Heart Surgery;  Laterality: N/A;   RIGHT/LEFT HEART CATH AND CORONARY ANGIOGRAPHY N/A 08/11/2016   Procedure: Right/Left Heart Cath and Coronary Angiography;  Surgeon: [name], MD;  Location: [facility];  Service: Cardiovascular;  Laterality: N/A;   TEE WITHOUT CARDIOVERSION N/A 07/17/2016   Procedure: TRANSESOPHAGEAL ECHOCARDIOGRAM (TEE);  Surgeon: [name], MD;  Location: [facility];  Service: Cardiovascular;  Laterality: N/A;   TEE WITHOUT CARDIOVERSION N/A 03/24/2017   Procedure: TRANSESOPHAGEAL ECHOCARDIOGRAM (TEE);  Surgeon: Rexene Alberts, MD;  Location: [facility];  Service: Open Heart Surgery;  Laterality: N/A;   TONSILLECTOMY  1950's   TOTAL HIP ARTHROPLASTY Left 10/05/2014   dr [name]   TOTAL HIP ARTHROPLASTY Left 10/05/2014   Procedure: LEFT TOTAL HIP ARTHROPLASTY ANTERIOR  APPROACH;  Surgeon: [name], MD;  Location: [facility];  Service: Orthopedics;  Laterality: Left;   VIDEO ASSISTED THORACOSCOPY (VATS)/WEDGE RESECTION Right 09/02/2016   Procedure: VIDEO ASSISTED THORACOSCOPY (VATS)/RIGHT UPPER LOBE WEDGE RESECTION;  Surgeon: Melrose Nakayama, MD;  Location: [facility];  Service: Thoracic;  Laterality: Right;   VIDEO BRONCHOSCOPY WITH ENDOBRONCHIAL NAVIGATION N/A 10/19/2012   Procedure: VIDEO BRONCHOSCOPY WITH ENDOBRONCHIAL NAVIGATION;  Surgeon: [name], MD;  Location: [facility];  Service: Thoracic;  Laterality: N/A;   WRIST RECONSTRUCTION Left 12/2009   'proximal row carpectomy" [name]    REVIEW OF SYSTEMS:  Constitutional: negative Eyes: negative Ears, nose, mouth, throat, and face: negative Respiratory: positive for dyspnea on exertion Cardiovascular: negative Gastrointestinal: negative Genitourinary:negative Integument/breast: negative Hematologic/lymphatic:  negative Musculoskeletal:negative Neurological: negative Behavioral/Psych: negative Endocrine: negative Allergic/Immunologic: negative   PHYSICAL EXAMINATION: General appearance: alert, cooperative and no distress Head: Normocephalic, without obvious abnormality, atraumatic Neck: no adenopathy, no JVD, supple, symmetrical, trachea midline and thyroid not enlarged, symmetric, no tenderness/mass/nodules Lymph nodes: Cervical, supraclavicular, and axillary nodes normal. Resp: clear to auscultation bilaterally Back: symmetric, no curvature. ROM normal. No CVA tenderness. Cardio: regular rate and rhythm, S1, S2 normal, no murmur, click, rub or gallop GI: soft, non-tender; bowel sounds normal; no masses,  no organomegaly Extremities: extremities normal, atraumatic, no cyanosis or edema Neurologic: Alert and oriented X 3, normal strength and tone. Normal symmetric reflexes. Normal coordination and gait  ECOG PERFORMANCE STATUS: 1 - Symptomatic but completely ambulatory  Blood pressure 138/69, pulse 66, temperature 98.9 F (37.2 C), temperature source Oral, resp. rate 18, height _0  (1.753 m), weight 230 lb 1.6 oz (104.4 kg), SpO2 98 %.  LABORATORY DATA: Lab Results  Component Value Date   WBC 5.8 12/19/2018   HGB 15.6 12/19/2018   HCT 47.1 12/19/2018   MCV 94.6 12/19/2018   PLT 132 (L) 12/19/2018      Chemistry      Component Value Date/Time   NA 140 12/19/2018 0957   NA 142 10/01/2016 1534   K 5.1 12/19/2018 0957   K 4.2 10/01/2016 1534   CL 109 12/19/2018 0957   CO2 22 12/19/2018 0957   CO2 28 10/01/2016 1534   BUN 21 12/19/2018 0957   BUN 15.9 10/01/2016 1534   CREATININE 1.46 (H) 12/19/2018 0957   CREATININE 1.10 02/08/2017 1101   CREATININE 1.5 (H) 10/01/2016 1534      Component Value Date/Time   CALCIUM 9.4 12/19/2018 0957   CALCIUM 9.6 10/01/2016 1534   ALKPHOS 52 12/19/2018 0957   ALKPHOS 65 10/01/2016 1534   AST 29 12/19/2018 0957   AST 20 10/01/2016 1534    ALT 31 12/19/2018 0957   ALT 24 10/01/2016 1534   BILITOT 0.4 12/19/2018 0957   BILITOT 0.46 10/01/2016 1534       RADIOGRAPHIC STUDIES: No results found.  ASSESSMENT AND PLAN: This is a very pleasant 68 years old white male with a stage Ia non-small cell lung cancer status post right upper lobectomy with lymph node dissection on September 02, 2016. The patient he was on observation since that time. Unfortunately there are some concerning findings with nodular density in the posterior right lower lobe as well as increase and right pleural effusion suspicious for disease recurrence. He underwent ultrasound-guided right thoracentesis and the cytology of the pleural fluid was consistent with recurrent adenocarcinoma. He had molecular studies performed by [facility] 1 that showed positive EGFR mutation with deletion 62. The patient was started on treatment  with Tagrisso 80 mg p.o. daily on 11/05/2017.  He status post 13 months of treatment. The patient has been tolerating this treatment well with no concerning adverse effects. I recommended for him to continue his current treatment with Tagrisso with the same dose. I will see him back for follow-up visit in 1 months for evaluation after repeating CT scan of the chest, abdomen and pelvis for restaging of his disease. He was advised to call immediately if he has any concerning symptoms in the interval. The patient voices understanding of current disease status and treatment options and is in agreement with the current care plan. All questions were answered. The patient knows to call the clinic with any problems, questions or concerns. We can certainly see the patient much sooner if necessary.  Disclaimer: This note was dictated with voice recognition software. Similar sounding words can inadvertently be transcribed and may not be corrected upon review.

## 2018-12-20 ENCOUNTER — Telehealth: Payer: Self-pay | Admitting: Internal Medicine

## 2018-12-20 NOTE — Telephone Encounter (Signed)
Scheduled appt per 8/10 los - mailed letter with appt date and time

## 2018-12-23 MED FILL — TAGRISSO 80 MG TABLET: 80 | 30 days supply | Qty: 30 | Fill #2

## 2019-01-18 ENCOUNTER — Other Ambulatory Visit: Payer: Self-pay | Admitting: Internal Medicine

## 2019-01-18 DIAGNOSIS — C3491 Malignant neoplasm of unspecified part of right bronchus or lung: Secondary | ICD-10-CM

## 2019-01-20 ENCOUNTER — Ambulatory Visit (HOSPITAL_COMMUNITY)
Admission: RE | Admit: 2019-01-20 | Discharge: 2019-01-20 | Disposition: A | Discharge status: Home / Self Care | Payer: Medicare Other | Source: Ambulatory Visit | Attending: Internal Medicine | Admitting: Internal Medicine

## 2019-01-20 ENCOUNTER — Inpatient Hospital Stay: Payer: Medicare Other | Attending: Internal Medicine

## 2019-01-20 ENCOUNTER — Other Ambulatory Visit: Payer: Self-pay

## 2019-01-20 DIAGNOSIS — I10 Essential (primary) hypertension: Secondary | ICD-10-CM | POA: Insufficient documentation

## 2019-01-20 DIAGNOSIS — C3411 Malignant neoplasm of upper lobe, right bronchus or lung: Secondary | ICD-10-CM | POA: Diagnosis present

## 2019-01-20 DIAGNOSIS — C3490 Malignant neoplasm of unspecified part of unspecified bronchus or lung: Secondary | ICD-10-CM | POA: Diagnosis present

## 2019-01-20 DIAGNOSIS — N289 Disorder of kidney and ureter, unspecified: Secondary | ICD-10-CM | POA: Insufficient documentation

## 2019-01-20 LAB — CMP (CANCER CENTER ONLY)
ALT: 28 U/L (ref 0–44)
AST: 27 U/L (ref 15–41)
Albumin: 4.1 g/dL (ref 3.5–5.0)
Alkaline Phosphatase: 52 U/L (ref 38–126)
Anion gap: 9 (ref 5–15)
BUN: 17 mg/dL (ref 8–23)
CO2: 23 mmol/L (ref 22–32)
Calcium: 9.1 mg/dL (ref 8.9–10.3)
Chloride: 107 mmol/L (ref 98–111)
Creatinine: 1.43 mg/dL — ABNORMAL HIGH (ref 0.61–1.24)
GFR, Est AFR Am: 58 mL/min — ABNORMAL LOW (ref >60)
GFR, Estimated: 50 mL/min — ABNORMAL LOW (ref >60)
Glucose, Bld: 119 mg/dL — ABNORMAL HIGH (ref 70–99)
Potassium: 4.1 mmol/L (ref 3.5–5.1)
Sodium: 139 mmol/L (ref 135–145)
Total Bilirubin: 0.5 mg/dL (ref 0.3–1.2)
Total Protein: 7 g/dL (ref 6.5–8.1)

## 2019-01-20 LAB — CBC WITH DIFFERENTIAL (CANCER CENTER ONLY)
Abs Immature Granulocytes: 0.03 10*3/uL (ref 0.00–0.07)
Basophils Absolute: 0 10*3/uL (ref 0.0–0.1)
Basophils Relative: 1 %
Eosinophils Absolute: 0.2 10*3/uL (ref 0.0–0.5)
Eosinophils Relative: 3 %
HCT: 46.9 % (ref 39.0–52.0)
Hemoglobin: 15.6 g/dL (ref 13.0–17.0)
Immature Granulocytes: 1 %
Lymphocytes Relative: 19 %
Lymphs Abs: 1.2 10*3/uL (ref 0.7–4.0)
MCH: 31.3 pg (ref 26.0–34.0)
MCHC: 33.3 g/dL (ref 30.0–36.0)
MCV: 94.2 fL (ref 80.0–100.0)
Monocytes Absolute: 0.6 10*3/uL (ref 0.1–1.0)
Monocytes Relative: 10 %
Neutro Abs: 4.3 10*3/uL (ref 1.7–7.7)
Neutrophils Relative %: 66 %
Platelet Count: 132 10*3/uL — ABNORMAL LOW (ref 150–400)
RBC: 4.98 MIL/uL (ref 4.22–5.81)
RDW: 14.1 % (ref 11.5–15.5)
WBC Count: 6.4 10*3/uL (ref 4.0–10.5)
nRBC: 0 % (ref 0.0–0.2)

## 2019-01-20 MED ORDER — IOHEXOL 300 MG/ML  SOLN
100.0000 mL | Freq: Once | INTRAMUSCULAR | Status: AC | PRN
  Administered 2019-01-20: 100 mL via INTRAVENOUS

## 2019-01-20 MED ORDER — SODIUM CHLORIDE (PF) 0.9 % IJ SOLN
INTRAMUSCULAR | Status: AC
  Filled 2019-01-20: qty 50

## 2019-01-20 MED FILL — TAGRISSO 80 MG TABLET: 80 | 30 days supply | Qty: 30 | Fill #0

## 2019-01-23 ENCOUNTER — Inpatient Hospital Stay (HOSPITAL_BASED_OUTPATIENT_CLINIC_OR_DEPARTMENT_OTHER): Payer: Medicare Other | Admitting: Internal Medicine

## 2019-01-23 ENCOUNTER — Encounter: Payer: Self-pay | Admitting: Internal Medicine

## 2019-01-23 ENCOUNTER — Other Ambulatory Visit: Payer: Self-pay

## 2019-01-23 VITALS — BP 148/69 | HR 70 | Temp 98.9°F | Resp 18 | Ht 69.0 in | Wt 231.6 lb

## 2019-01-23 DIAGNOSIS — I5032 Chronic diastolic (congestive) heart failure: Secondary | ICD-10-CM | POA: Diagnosis not present

## 2019-01-23 DIAGNOSIS — Z5111 Encounter for antineoplastic chemotherapy: Secondary | ICD-10-CM | POA: Diagnosis not present

## 2019-01-23 DIAGNOSIS — C3491 Malignant neoplasm of unspecified part of right bronchus or lung: Secondary | ICD-10-CM

## 2019-01-23 DIAGNOSIS — C3411 Malignant neoplasm of upper lobe, right bronchus or lung: Secondary | ICD-10-CM | POA: Diagnosis not present

## 2019-01-23 NOTE — Progress Notes (Signed)
[place] [phone]   [phone]  OFFICE PROGRESS NOTE  [name], MD [address] [facility] [address] [place] [address]  DIAGNOSIS: Recurrent non-small cell lung cancer, adenocarcinoma initially diagnosed as stage IA (T1c, N0, M0) non-small cell lung cancer, adenocarcinoma presented with right upper lobe lung nodule in April 2018 with recurrence in June 2019.  Biomarker Findings Microsatellite status - MS-Stable Tumor Mutational Burden - TMB-Low (3 Muts/Mb) Genomic Findings For a complete list of the genes assayed, please refer to the Appendix. EGFR exon 19 deletion ([id number]) CDKN2A/B loss NKX2-1 amplification 7 Disease relevant genes with no reportable alterations: KRAS, ALK, BRAF, MET, RET, ERBB2, ROS1   PRIOR THERAPY: Status post right upper lobectomy with lymph node dissection under the care of Dr. [name] on 09/02/2016.  CURRENT THERAPY: Tagrisso 80 mg p.o. daily.  First dose started November 05, 2017.  Status post 14 months of treatment.  INTERVAL HISTORY: James [name] 68 y.o. male returns to the clinic today for follow-up visit.  His wife James [name] was available by phone.  The patient is feeling fine today with no concerning complaints except for mild shortness of breath with exertion.  He gained few pounds since his last time.  The patient denied having any chest pain, cough or hemoptysis.  He denied having any fever or chills.  He has no nausea, vomiting, diarrhea or constipation.  He denied having any headache or visual changes.  He continues to tolerate his treatment with Tagrisso fairly well.  He had repeat CT scan of the chest, abdomen pelvis performed recently and he is here for evaluation and discussion of his scan results.  MEDICAL HISTORY: Past Medical History:  Diagnosis Date   Adenocarcinoma of right lung, stage 1 ([place]) 09/06/2016   Anxiety    Arthritis    "knees, hips, back" (10/19/2012)   Chronic diastolic  congestive heart failure (HCC)    Chronic lower back pain    Colon polyps    10/27/2002, repeat letter 09/17/2007   Coronary artery disease    Coronary artery disease involving native coronary artery of native heart without angina pectoris    Depressive disorder, not elsewhere classified    no meds   Diabetes mellitus without complication ([facility])    diet controlled- no med  (while in hosp 4/18 -elevated cbg   Dyspnea    Fasting hyperglycemia    GERD (gastroesophageal reflux disease)    Heart murmur    Hemoptysis    abnormal CT Chest 01/29/10 - ? new GG changes RUL > not viz on plain cxr 02/26/2010   Hypertension    Mitral regurgitation    severe MR 08/2016   MPN (myeloproliferative neoplasm) ([facility])    1st detected 06/04/1998   Obesity    OSA on CPAP    last sleep study 10 years ago   Other and unspecified hyperlipidemia    Peripheral vascular disease ([facility]) 08/2016   after lung surgey small clots in lungs,after hip dvt-5/16   Pneumonia    4/18   Positive PPD 1965   "non reactive in 2012" (10/19/2012)   Routine general medical examination at a health care facility    S/P CABG x 1 03/24/2017   LIMA to LAD   S/P mitral valve repair 03/24/2017   Complex valvuloplasty including artificial Gore-tex neochord placement x6 and 28 mm Sorin Annuloflex posterior annuloplasty band   Special screening for malignant neoplasm of prostate    Spinal  stenosis, unspecified region other than cervical    Wrist pain, left     ALLERGIES:  is allergic to symbicort [budesonide-formoterol fumarate] and amoxicillin.  MEDICATIONS:  Current Outpatient Medications  Medication Sig Dispense Refill   metoprolol tartrate (LOPRESSOR) 25 MG tablet Take 0.5 tablets (12.5 mg total) by mouth 2 (two) times daily. 90 tablet 1   Omega-3 Fatty Acids (FISH OIL) 500 MG CAPS Take 500 mg by mouth daily.      rivaroxaban (XARELTO) 20 MG TABS tablet Take 1 tablet (20 mg total) by mouth daily with  supper. 90 tablet 1   rosuvastatin (CRESTOR) 40 MG tablet Take 1 tablet (40 mg total) by mouth daily. (Patient not taking: Reported on 12/19/2018) 90 tablet 1   sildenafil (REVATIO) 20 MG tablet Take 4 tablets (80 mg total) by mouth daily as needed. 60 tablet 5   TAGRISSO 80 MG tablet      TAGRISSO 80 MG tablet TAKE 1 TABLET (80 MG TOTAL) BY MOUTH DAILY. 30 tablet 2   torsemide (DEMADEX) 10 MG tablet Take 1 tablet (10 mg total) by mouth daily. (Patient not taking: Reported on 12/19/2018) 90 tablet 1   No current facility-administered medications for this visit.     SURGICAL HISTORY:  Past Surgical History:  Procedure Laterality Date   ANTERIOR CRUCIATE LIGAMENT REPAIR Left 1967   CARDIAC CATHETERIZATION  2000   CHEST TUBE INSERTION Right 10/19/2012   post bronch   COLONOSCOPY W/ POLYPECTOMY     CORONARY ARTERY BYPASS GRAFT N/A 03/24/2017   Procedure: CORONARY ARTERY BYPASS GRAFTING (CABG)x1 using left internal mammary artery, LIMA-LAD;  Surgeon: Rexene Alberts, MD;  Location: [facility];  Service: Open Heart Surgery;  Laterality: N/A;   FLEXIBLE BRONCHOSCOPY  10/19/2012   Flexible video fiberoptic bronchoscopy with electromagnetic navigation and biopsies.   INTRAVASCULAR PRESSURE WIRE/FFR STUDY N/A 08/11/2016   Procedure: Intravascular Pressure Wire/FFR Study;  Surgeon: [name], MD;  Location: [facility];  Service: Cardiovascular;  Laterality: N/A;   IR ANGIOGRAM PULMONARY BILATERAL SELECTIVE  02/06/2018   IR ANGIOGRAM SELECTIVE EACH ADDITIONAL VESSEL  02/06/2018   IR ANGIOGRAM SELECTIVE EACH ADDITIONAL VESSEL  02/06/2018   IR INFUSION THROMBOL ARTERIAL INITIAL (MS)  02/06/2018   IR INFUSION THROMBOL ARTERIAL INITIAL (MS)  02/06/2018   IR THROMB F/U EVAL ART/VEN FINAL DAY (MS)  02/07/2018   IR US GUIDE VASC ACCESS RIGHT  02/06/2018   LOBECTOMY Right 09/02/2016   Procedure: RIGHT UPPER LOBECTOMY;  Surgeon: Melrose Nakayama, MD;  Location: [facility];  Service:  Thoracic;  Laterality: Right;   LYMPH NODE DISSECTION Right 09/02/2016   Procedure: LYMPH NODE DISSECTION, RIGHT LUNG;  Surgeon: Melrose Nakayama, MD;  Location: [facility];  Service: Thoracic;  Laterality: Right;   MITRAL VALVE REPAIR N/A 03/24/2017   Procedure: MITRAL VALVE REPAIR (MVR) with Sorin Carbomedics Annuloflex size 28;  Surgeon: Rexene Alberts, MD;  Location: [facility];  Service: Open Heart Surgery;  Laterality: N/A;   RIGHT/LEFT HEART CATH AND CORONARY ANGIOGRAPHY N/A 08/11/2016   Procedure: Right/Left Heart Cath and Coronary Angiography;  Surgeon: [name], MD;  Location: [facility];  Service: Cardiovascular;  Laterality: N/A;   TEE WITHOUT CARDIOVERSION N/A 07/17/2016   Procedure: TRANSESOPHAGEAL ECHOCARDIOGRAM (TEE);  Surgeon: [name], MD;  Location: [facility];  Service: Cardiovascular;  Laterality: N/A;   TEE WITHOUT CARDIOVERSION N/A 03/24/2017   Procedure: TRANSESOPHAGEAL ECHOCARDIOGRAM (TEE);  Surgeon: Rexene Alberts, MD;  Location:  [facility];  Service: Open Heart Surgery;  Laterality: N/A;   TONSILLECTOMY  1950's   TOTAL HIP ARTHROPLASTY Left 10/05/2014   dr [name]   TOTAL HIP ARTHROPLASTY Left 10/05/2014   Procedure: LEFT TOTAL HIP ARTHROPLASTY ANTERIOR APPROACH;  Surgeon: [name], MD;  Location: [facility];  Service: Orthopedics;  Laterality: Left;   VIDEO ASSISTED THORACOSCOPY (VATS)/WEDGE RESECTION Right 09/02/2016   Procedure: VIDEO ASSISTED THORACOSCOPY (VATS)/RIGHT UPPER LOBE WEDGE RESECTION;  Surgeon: Melrose Nakayama, MD;  Location: [facility];  Service: Thoracic;  Laterality: Right;   VIDEO BRONCHOSCOPY WITH ENDOBRONCHIAL NAVIGATION N/A 10/19/2012   Procedure: VIDEO BRONCHOSCOPY WITH ENDOBRONCHIAL NAVIGATION;  Surgeon: [name], MD;  Location: [facility];  Service: Thoracic;  Laterality: N/A;   WRIST RECONSTRUCTION Left 12/2009   'proximal row carpectomy" [name]    REVIEW OF SYSTEMS:  Constitutional: negative Eyes: negative Ears, nose,  mouth, throat, and face: negative Respiratory: positive for dyspnea on exertion Cardiovascular: negative Gastrointestinal: negative Genitourinary:negative Integument/breast: negative Hematologic/lymphatic: negative Musculoskeletal:negative Neurological: negative Behavioral/Psych: negative Endocrine: negative Allergic/Immunologic: negative   PHYSICAL EXAMINATION: General appearance: alert, cooperative and no distress Head: Normocephalic, without obvious abnormality, atraumatic Neck: no adenopathy, no JVD, supple, symmetrical, trachea midline and thyroid not enlarged, symmetric, no tenderness/mass/nodules Lymph nodes: Cervical, supraclavicular, and axillary nodes normal. Resp: clear to auscultation bilaterally Back: symmetric, no curvature. ROM normal. No CVA tenderness. Cardio: regular rate and rhythm, S1, S2 normal, no murmur, click, rub or gallop GI: soft, non-tender; bowel sounds normal; no masses,  no organomegaly Extremities: extremities normal, atraumatic, no cyanosis or edema Neurologic: Alert and oriented X 3, normal strength and tone. Normal symmetric reflexes. Normal coordination and gait  ECOG PERFORMANCE STATUS: 1 - Symptomatic but completely ambulatory  Blood pressure (!) 148/69, pulse 70, temperature 98.9 F (37.2 C), temperature source Temporal, resp. rate 18, height 5' 9"  (1.753 m), weight 231 lb 9.6 oz (105.1 kg), SpO2 98 %.  LABORATORY DATA: Lab Results  Component Value Date   WBC 6.4 01/20/2019   HGB 15.6 01/20/2019   HCT 46.9 01/20/2019   MCV 94.2 01/20/2019   PLT 132 (L) 01/20/2019      Chemistry      Component Value Date/Time   NA 139 01/20/2019 0950   NA 142 10/01/2016 1534   K 4.1 01/20/2019 0950   K 4.2 10/01/2016 1534   CL 107 01/20/2019 0950   CO2 23 01/20/2019 0950   CO2 28 10/01/2016 1534   BUN 17 01/20/2019 0950   BUN 15.9 10/01/2016 1534   CREATININE 1.43 (H) 01/20/2019 0950   CREATININE 1.10 02/08/2017 1101   CREATININE 1.5 (H)  10/01/2016 1534      Component Value Date/Time   CALCIUM 9.1 01/20/2019 0950   CALCIUM 9.6 10/01/2016 1534   ALKPHOS 52 01/20/2019 0950   ALKPHOS 65 10/01/2016 1534   AST 27 01/20/2019 0950   AST 20 10/01/2016 1534   ALT 28 01/20/2019 0950   ALT 24 10/01/2016 1534   BILITOT 0.5 01/20/2019 0950   BILITOT 0.46 10/01/2016 1534       RADIOGRAPHIC STUDIES: Ct Chest W Contrast  Result Date: 01/20/2019 CLINICAL DATA:  Restaging non-small cell lung cancer. Prior right upper lobectomy. EXAM: CT CHEST, ABDOMEN, AND PELVIS WITH CONTRAST TECHNIQUE: Multidetector CT imaging of the chest, abdomen and pelvis was performed following the standard protocol during bolus administration of intravenous contrast. CONTRAST:  164m OMNIPAQUE IOHEXOL 300 MG/ML  SOLN COMPARISON:  Multiple exams, including 11/04/2018 FINDINGS: CT CHEST FINDINGS Cardiovascular: Coronary,  aortic arch, atherosclerotic calcification of the thoracic aorta and left anterior descending coronary artery. Calcification of the aortic and mitral valves. Chronic reduction in caliber of the left lower lobe pulmonary artery with some hypodensity, compatible chronic embolus, as on multiple prior exams. No acute pulmonary embolus is identified although today's exam was not optimized to assess for embolus. Mediastinum/Nodes: No pathologic adenopathy. Lungs/Pleura: Stable small right pleural effusion. Postoperative findings from right upper lobectomy. Calcified 2 mm nodule in the right middle lobe, stable. Stable 4 mm right lower lobe nodule, image 60/4 stable 2 mm left upper lobe nodule on image 25/4. Stable branching density plugging bronchiectatic airways posteromedially in the left upper lobe on image 36/4, likely a bronchocele. Similar stable nodularity anteriorly in the right upper lobe on image 34/4 common no change from prior. Peripheral subpleural scarring at the left lung base with associated central calcification, unchanged. No new or progressive  findings in the lungs. Musculoskeletal: Thoracic spondylosis. CT ABDOMEN PELVIS FINDINGS Hepatobiliary: Stable right hepatic lobe hypodense lesions compatible with cysts. Gallbladder unremarkable. Pancreas: Unremarkable Spleen: Unremarkable Adrenals/Urinary Tract: Left peripelvic cysts and left kidney lower pole cyst, benign appearance. No worrisome renal lesions. The adrenal glands appear normal. Duplicated left renal collecting system noted. Stomach/Bowel: Unremarkable Vascular/Lymphatic: Aortoiliac atherosclerotic vascular disease. No pathologic adenopathy. Reproductive: Prostate gland measures 5.1 by 4.8 by 5.5 cm (volume = 70 cm^3) , with small dystrophic calcifications. Other: No supplemental non-categorized findings. Musculoskeletal: Left total hip prosthesis. Lumbar spondylosis and degenerative disc disease likely causing mild multilevel impingement. IMPRESSION: 1. No findings of active malignancy. Similar findings in the lungs with some stable small lung nodules and stable plugging of bronchiectatic left upper lobe airways favoring bronchocele. 2. Other imaging findings of potential clinical significance: Aortic Atherosclerosis (ICD10-I70.0). Coronary atherosclerosis. Suspected chronic pulmonary embolus in the left lower lobe. Calcifications of the aortic and mitral valve. Hepatic and left renal cyst. Duplicated left renal collecting system. Prostatomegaly. Mild multilevel impingement in the lumbar spine. Stable small right pleural effusion. Electronically Signed   By: Van Clines M.D.   On: 01/20/2019 11:40   Ct Abdomen Pelvis W Contrast  Result Date: 01/20/2019 CLINICAL DATA:  Restaging non-small cell lung cancer. Prior right upper lobectomy. EXAM: CT CHEST, ABDOMEN, AND PELVIS WITH CONTRAST TECHNIQUE: Multidetector CT imaging of the chest, abdomen and pelvis was performed following the standard protocol during bolus administration of intravenous contrast. CONTRAST:  113m OMNIPAQUE IOHEXOL 300  MG/ML  SOLN COMPARISON:  Multiple exams, including 11/04/2018 FINDINGS: CT CHEST FINDINGS Cardiovascular: Coronary, aortic arch, atherosclerotic calcification of the thoracic aorta and left anterior descending coronary artery. Calcification of the aortic and mitral valves. Chronic reduction in caliber of the left lower lobe pulmonary artery with some hypodensity, compatible chronic embolus, as on multiple prior exams. No acute pulmonary embolus is identified although today's exam was not optimized to assess for embolus. Mediastinum/Nodes: No pathologic adenopathy. Lungs/Pleura: Stable small right pleural effusion. Postoperative findings from right upper lobectomy. Calcified 2 mm nodule in the right middle lobe, stable. Stable 4 mm right lower lobe nodule, image 60/4 stable 2 mm left upper lobe nodule on image 25/4. Stable branching density plugging bronchiectatic airways posteromedially in the left upper lobe on image 36/4, likely a bronchocele. Similar stable nodularity anteriorly in the right upper lobe on image 34/4 common no change from prior. Peripheral subpleural scarring at the left lung base with associated central calcification, unchanged. No new or progressive findings in the lungs. Musculoskeletal: Thoracic spondylosis. CT ABDOMEN PELVIS FINDINGS Hepatobiliary:  Stable right hepatic lobe hypodense lesions compatible with cysts. Gallbladder unremarkable. Pancreas: Unremarkable Spleen: Unremarkable Adrenals/Urinary Tract: Left peripelvic cysts and left kidney lower pole cyst, benign appearance. No worrisome renal lesions. The adrenal glands appear normal. Duplicated left renal collecting system noted. Stomach/Bowel: Unremarkable Vascular/Lymphatic: Aortoiliac atherosclerotic vascular disease. No pathologic adenopathy. Reproductive: Prostate gland measures 5.1 by 4.8 by 5.5 cm (volume = 70 cm^3) , with small dystrophic calcifications. Other: No supplemental non-categorized findings. Musculoskeletal: Left  total hip prosthesis. Lumbar spondylosis and degenerative disc disease likely causing mild multilevel impingement. IMPRESSION: 1. No findings of active malignancy. Similar findings in the lungs with some stable small lung nodules and stable plugging of bronchiectatic left upper lobe airways favoring bronchocele. 2. Other imaging findings of potential clinical significance: Aortic Atherosclerosis (ICD10-I70.0). Coronary atherosclerosis. Suspected chronic pulmonary embolus in the left lower lobe. Calcifications of the aortic and mitral valve. Hepatic and left renal cyst. Duplicated left renal collecting system. Prostatomegaly. Mild multilevel impingement in the lumbar spine. Stable small right pleural effusion. Electronically Signed   By: Van Clines M.D.   On: 01/20/2019 11:40    ASSESSMENT AND PLAN: This is a very pleasant 68 years old white male with a stage Ia non-small cell lung cancer status post right upper lobectomy with lymph node dissection on September 02, 2016. The patient he was on observation since that time. Unfortunately there are some concerning findings with nodular density in the posterior right lower lobe as well as increase and right pleural effusion suspicious for disease recurrence. He underwent ultrasound-guided right thoracentesis and the cytology of the pleural fluid was consistent with recurrent adenocarcinoma. He had molecular studies performed by foundation 1 that showed positive EGFR mutation with deletion 1. The patient was started on treatment with Tagrisso 80 mg p.o. daily on 11/05/2017.  He status post 14 months of treatment. The patient has been tolerating this treatment well with no concerning adverse effects. He had repeat CT scan of the chest, abdomen pelvis performed recently.  I personally and independently reviewed the scan images and discussed the results with the patient and his wife. His a scan showed no concerning findings for disease progression. I  recommended for the patient to continue his current treatment with Tagrisso with the same dose. He will come back for follow-up visit in 6 weeks for evaluation with repeat blood work and evaluation. For the renal insufficiency and hypertension, he will continue his routine follow-up visit by his primary care physician. He was advised to call immediately if he has any concerning symptoms in the interval. The patient voices understanding of current disease status and treatment options and is in agreement with the current care plan. All questions were answered. The patient knows to call the clinic with any problems, questions or concerns. We can certainly see the patient much sooner if necessary.  Disclaimer: This note was dictated with voice recognition software. Similar sounding words can inadvertently be transcribed and may not be corrected upon review.

## 2019-01-24 ENCOUNTER — Telehealth: Payer: Self-pay | Admitting: Internal Medicine

## 2019-01-24 NOTE — Telephone Encounter (Signed)
Scheduled appt per 9/14 los- pt wife aware of appt date and time

## 2019-02-02 NOTE — Progress Notes (Signed)
Cardiology Office Note    Date:  02/06/2019   ID:  James [name], DOB 05-12-1950, MRN [id number]  PCP:  [name], MD  Cardiologist:  [name]   Chief Complaint  Patient presents with   Shortness of Breath    History of Present Illness:  James [name] is a 68 y.o. male with PMH of DM II, HLD, and severe MR. he was originally noted to have a loud murmur by his primary care physician.  Initial echocardiogram suggested mild MR, however it also suggested partially flail leaflet.  MR was thought to be underestimated.  TEE showed a flail P3 segment with ruptured cord and a severe MR.  Cardiac catheterization performed on 08/11/2016 showed 65% mid to distal LAD lesion, FFR borderline at 0.81, EF 65%.  Normal cardiac output and RV/LV filling pressure.  He had a PET scan on 08/17/2016 that showed a hypermetabolic solid 2.6 cm anterior right upper lobe pulmonary nodule compatible with prior bronchogenic, no hypermetabolic thoracic lymphadenopathy or distal metastatic disease.  There were nonspecific and mild asymmetric hypermetabolism in the left pontine tonsil and the left tongue base region.  CT surgery recommended lobectomy for his lung mass with possible CABG with LIMA to LAD and MV repair.  He underwent right upper lobe lobectomy in April.  Pathology positive for adenocarcinoma stage Ia.  Serial CT scan was recommended by oncology.  Post procedure, he became very short of breath.  CTA was positive for a small PE, he was started on Xarelto.  Repeat echocardiogram obtained on 02/15/2017 showed EF 60-65%, persistent MR.  He eventually underwent mitral valve repair with Sorin Carbomedics Annuloglex size 28, LIMA to LAD by Dr. Roxy Manns. He was DC on coumadin instead of Xarelto. Completed 6 months of anticoagulation for provoked PE and then it was stopped.   When last seen in April he was progressing well. More recently in August CT showed a right pleural effusion. Thoracentesis was positive for recurrent  CA. He was started on Tagrisso by Dr. Inda Merlin. He was admitted on February 06, 2018 with submassive PE. He was treated with  EKOS with bilateral intra-arterial TPA, patient completed 6 days of IV heparin.The original plan was to transition to Xarelto after 5 days of IV heparin. However he developed left flank pain, hematuria and right leg painful swelling. Hence IV heparin was continued. Both Urology andOIrthopedics evaluated and cleared for transitioning to [place].  Xarelto was started on 02/12/2018.  Hematuria decreased  Right leg pain also improved. MRI did show evidence of hematoma that was improving. He had chronic DVT in right peroneal vein.  Since patient was started on Xarelto, aspirin discontinued to minimize bleeding risk. He had follow up CT in September showing no evidence of progression of CA.   On follow up today he notes persistent symptoms of SOB. Was seen by Dr Ronnald Ramp in August. BNP 122. Was prescribed demadex 10 mg daily but did not start. Notes breathing is worse when humidity is high or with more exertion. Has chronic LE edema since his PE. Wears support hose. Also prescribed high dose Crestor. He did not take this because Crestor made him feel bad in the past. Lipitor was stopped one year ago due to myalgias and elevated CPK. He denies any chest pain. On Xarelto chronically. No bleeding issues.   Past Medical History:  Diagnosis Date   Adenocarcinoma of right lung, stage 1 ([place]) 09/06/2016   Anxiety    Arthritis    "knees,  hips, back" (10/19/2012)   Chronic diastolic congestive heart failure (HCC)    Chronic lower back pain    Colon polyps    10/27/2002, repeat letter 09/17/2007   Coronary artery disease    Coronary artery disease involving native coronary artery of native heart without angina pectoris    Depressive disorder, not elsewhere classified    no meds   Diabetes mellitus without complication ([place])    diet controlled- no med  (while in hosp 4/18 -elevated  cbg   Dyspnea    Fasting hyperglycemia    GERD (gastroesophageal reflux disease)    Heart murmur    Hemoptysis    abnormal CT Chest 01/29/10 - ? new GG changes RUL > not viz on plain cxr 02/26/2010   Hypertension    Mitral regurgitation    severe MR 08/2016   MPN (myeloproliferative neoplasm) ([place])    1st detected 06/04/1998   Obesity    OSA on CPAP    last sleep study 10 years ago   Other and unspecified hyperlipidemia    Peripheral vascular disease ([place]) 08/2016   after lung surgey small clots in lungs,after hip dvt-5/16   Pneumonia    4/18   Positive PPD 1965   "non reactive in 2012" (10/19/2012)   Routine general medical examination at a health care facility    S/P CABG x 1 03/24/2017   LIMA to LAD   S/P mitral valve repair 03/24/2017   Complex valvuloplasty including artificial Gore-tex neochord placement x6 and 28 mm Sorin Annuloflex posterior annuloplasty band   Special screening for malignant neoplasm of prostate    Spinal stenosis, unspecified region other than cervical    Wrist pain, left     Past Surgical History:  Procedure Laterality Date   ANTERIOR CRUCIATE LIGAMENT REPAIR Left [place]   CHEST TUBE INSERTION Right 10/19/2012   post bronch   COLONOSCOPY W/ POLYPECTOMY     CORONARY ARTERY BYPASS GRAFT N/A 03/24/2017   Procedure: CORONARY ARTERY BYPASS GRAFTING (CABG)x1 using left internal mammary artery, LIMA-LAD;  Surgeon: Rexene Alberts, MD;  Location: [facility];  Service: Open Heart Surgery;  Laterality: N/A;   FLEXIBLE BRONCHOSCOPY  10/19/2012   Flexible video fiberoptic bronchoscopy with electromagnetic navigation and biopsies.   INTRAVASCULAR PRESSURE WIRE/FFR STUDY N/A 08/11/2016   Procedure: Intravascular Pressure Wire/FFR Study;  Surgeon: Elmer Boutelle M Martinique, MD;  Location: [facility];  Service: Cardiovascular;  Laterality: N/A;   IR ANGIOGRAM PULMONARY BILATERAL SELECTIVE  02/06/2018   IR ANGIOGRAM  SELECTIVE EACH ADDITIONAL VESSEL  02/06/2018   IR ANGIOGRAM SELECTIVE EACH ADDITIONAL VESSEL  02/06/2018   IR INFUSION THROMBOL ARTERIAL INITIAL (MS)  02/06/2018   IR INFUSION THROMBOL ARTERIAL INITIAL (MS)  02/06/2018   IR THROMB F/U EVAL ART/VEN FINAL DAY (MS)  02/07/2018   IR US GUIDE VASC ACCESS RIGHT  02/06/2018   LOBECTOMY Right 09/02/2016   Procedure: RIGHT UPPER LOBECTOMY;  Surgeon: Melrose Nakayama, MD;  Location: [facility];  Service: Thoracic;  Laterality: Right;   LYMPH NODE DISSECTION Right 09/02/2016   Procedure: LYMPH NODE DISSECTION, RIGHT LUNG;  Surgeon: Melrose Nakayama, MD;  Location: [facility];  Service: Thoracic;  Laterality: Right;   MITRAL VALVE REPAIR N/A 03/24/2017   Procedure: MITRAL VALVE REPAIR (MVR) with Sorin Carbomedics Annuloflex size 28;  Surgeon: Rexene Alberts, MD;  Location: [facility];  Service: Open Heart Surgery;  Laterality: N/A;   RIGHT/LEFT HEART CATH AND CORONARY  ANGIOGRAPHY N/A 08/11/2016   Procedure: Right/Left Heart Cath and Coronary Angiography;  Surgeon: Toran Murch M Martinique, MD;  Location: [facility];  Service: Cardiovascular;  Laterality: N/A;   TEE WITHOUT CARDIOVERSION N/A 07/17/2016   Procedure: TRANSESOPHAGEAL ECHOCARDIOGRAM (TEE);  Surgeon: [name], MD;  Location: [facility];  Service: Cardiovascular;  Laterality: N/A;   TEE WITHOUT CARDIOVERSION N/A 03/24/2017   Procedure: TRANSESOPHAGEAL ECHOCARDIOGRAM (TEE);  Surgeon: Rexene Alberts, MD;  Location: [facility];  Service: Open Heart Surgery;  Laterality: N/A;   TONSILLECTOMY  1950's   TOTAL HIP ARTHROPLASTY Left 10/05/2014   dr [name]   TOTAL HIP ARTHROPLASTY Left 10/05/2014   Procedure: LEFT TOTAL HIP ARTHROPLASTY ANTERIOR APPROACH;  Surgeon: [name], MD;  Location: [facility];  Service: Orthopedics;  Laterality: Left;   VIDEO ASSISTED THORACOSCOPY (VATS)/WEDGE RESECTION Right 09/02/2016   Procedure: VIDEO ASSISTED THORACOSCOPY (VATS)/RIGHT UPPER LOBE WEDGE RESECTION;  Surgeon:  Melrose Nakayama, MD;  Location: [facility];  Service: Thoracic;  Laterality: Right;   VIDEO BRONCHOSCOPY WITH ENDOBRONCHIAL NAVIGATION N/A 10/19/2012   Procedure: VIDEO BRONCHOSCOPY WITH ENDOBRONCHIAL NAVIGATION;  Surgeon: [name], MD;  Location: [facility];  Service: Thoracic;  Laterality: N/A;   WRIST RECONSTRUCTION Left 12/2009   'proximal row carpectomy" [name]    Current Medications: Outpatient Medications Prior to Visit  Medication Sig Dispense Refill   Omega-3 Fatty Acids (FISH OIL) 500 MG CAPS Take 500 mg by mouth daily.      sildenafil (REVATIO) 20 MG tablet Take 4 tablets (80 mg total) by mouth daily as needed. 60 tablet 5   TAGRISSO 80 MG tablet TAKE 1 TABLET (80 MG TOTAL) BY MOUTH DAILY. 30 tablet 2   Furosemide (LASIX PO) Take by mouth daily as needed.      metoprolol tartrate (LOPRESSOR) 25 MG tablet Take 0.5 tablets (12.5 mg total) by mouth 2 (two) times daily. 90 tablet 1   rivaroxaban (XARELTO) 20 MG TABS tablet Take 1 tablet (20 mg total) by mouth daily with supper. 90 tablet 1   rosuvastatin (CRESTOR) 40 MG tablet Take 1 tablet (40 mg total) by mouth daily. (Patient not taking: Reported on 12/19/2018) 90 tablet 1   torsemide (DEMADEX) 10 MG tablet Take 1 tablet (10 mg total) by mouth daily. (Patient not taking: Reported on 12/19/2018) 90 tablet 1   No facility-administered medications prior to visit.      Allergies:   Symbicort [budesonide-formoterol fumarate] and Amoxicillin   Social History   Socioeconomic History   Marital status: Married    Spouse name: Not on file   Number of children: 3   Years of education: Not on file   Highest education level: Not on file  Occupational History   Occupation: Retired, Financial risk analyst   Occupation: Retired, Real estate    Comment: [place] resource strain: Not on file   Food insecurity    Worry: Patient refused    Inability: Patient refused   Transportation needs    Medical:  Patient refused    Non-medical: Patient refused  Tobacco Use   Smoking status: Never Smoker   Smokeless tobacco: Never Used  Substance and Sexual Activity   Alcohol use: Yes    Alcohol/week: 1.0 standard drinks    Types: 1 Cans of beer per week    Comment: none since 3 weeks   Drug use: No   Sexual activity: Never    Partners: Female  Lifestyle   Physical  activity    Days per week: Patient refused    Minutes per session: Patient refused   Stress: Not on file  Relationships   Social connections    Talks on phone: Not on file    Gets together: Not on file    Attends religious service: Not on file    Active member of club or organization: Not on file    Attends meetings of clubs or organizations: Not on file    Relationship status: Not on file  Other Topics Concern   Not on file  Social History Narrative   HSG, [employer]. Married '73.  2 sons - '74,   '76;  1 daughter -  '77  6 grandchildren.   Work - IT consultant now retired. ACP - not fully discussed.                     Family History:  The patient's family history includes Heart attack in his father; Heart disease in his father and paternal uncle; Heart failure in his mother.   ROS:   Please see the history of present illness.    ROS All other systems reviewed and are negative.   PHYSICAL EXAM:   VS:  BP 137/74    Pulse 63    Temp (!) 97 F (36.1 C)    Ht 5\' 9"  (1.753 m)    Wt 231 lb (104.8 kg)    SpO2 97%    BMI 34.11 kg/m    GENERAL:  Well appearing WM in NAD HEENT:  PERRL, EOMI, sclera are clear. Oropharynx is clear. NECK:  No jugular venous distention, carotid upstroke brisk and symmetric, no bruits, no thyromegaly or adenopathy LUNGS:  Clear to auscultation bilaterally CHEST:  Unremarkable HEART:  RRR,  PMI not displaced or sustained,S1 and S2 within normal limits, no S3, no S4: no clicks, no rubs, gr 2/6 systolic murmur LSB ABD:  Soft, nontender. BS +, no masses or  bruits. No hepatomegaly, no splenomegaly EXT:  2 + pulses throughout, 2+  edema, no cyanosis no clubbing SKIN:  Warm and dry.  No rashes NEURO:  Alert and oriented x 3. Cranial nerves II through XII intact. PSYCH:  Cognitively intact      Wt Readings from Last 3 Encounters:  02/06/19 231 lb (104.8 kg)  01/23/19 231 lb 9.6 oz (105.1 kg)  12/19/18 230 lb 1.6 oz (104.4 kg)      Studies/Labs Reviewed:   EKG:  EKG is not ordered today.   Recent Labs: 02/07/2018: Magnesium 2.1 12/13/2018: Pro B Natriuretic peptide (BNP) 122.0; TSH 2.10 01/20/2019: ALT 28; BUN 17; Creatinine 1.43; Hemoglobin 15.6; Platelet Count 132; Potassium 4.1; Sodium 139   Lipid Panel    Component Value Date/Time   CHOL 233 (H) 12/13/2018 1101   CHOL 177 05/27/2017 0807   TRIG 130.0 12/13/2018 1101   TRIG 108 05/20/2006 0825   HDL 46.30 12/13/2018 1101   HDL 41 05/27/2017 0807   CHOLHDL 5 12/13/2018 1101   VLDL 26.0 12/13/2018 1101   LDLCALC 161 (H) 12/13/2018 1101   LDLCALC 110 (H) 05/27/2017 0807   LDLDIRECT 177.0 07/24/2015 1052    Additional studies/ records that were reviewed today include:    Cath 08/11/2016 Conclusion     Prox LAD to Mid LAD lesion, 30 %stenosed.  Mid LAD to Dist LAD lesion, 65 %stenosed.  Prox RCA to Mid RCA lesion, 15 %stenosed.  The left ventricular systolic function is normal.  LV end diastolic pressure is normal.  The left ventricular ejection fraction is 55-65% by visual estimate.  LV end diastolic pressure is normal.   1. Borderline single vessel obstructive CAD involving the mid LAD. FFR 0.81 2. Normal LV function EF 65% 3. Normal right heart and LV filling pressures 4. Normal Cardiac output  Plan: will refer to CT surgery for consideration of MV repair. The stenosis in the LAD will be discussed. He is asymptomatic and I would favor treating it medically. If he develops symptoms in the future it could be treated with PCI.        Echo 02/15/2017 LV  EF: 60% -   65%  Study Conclusions  - Left ventricle: The cavity size was normal. Wall thickness was   normal. Systolic function was normal. The estimated ejection   fraction was in the range of 60% to 65%. - Aortic valve: AV is thickened, calcified with minimally   restricted motion. - Mitral valve: Prolapse fo the posterior mitral leaflet. MR is at   least moderate in intensity Calcified annulus. Mildly thickened   leaflets . - Left atrium: The atrium was mildly dilated.    CABG with MVR 03/24/2017 Preoperative Diagnosis:        Severe Mitral Regurgitation  Single-vessel Coronary Artery Disease  Postoperative Diagnosis:    Same  Procedure:        Mitral Valve Repair             Complex valvuloplasty including artificial Gore-tex neochord placement x6             Sorin Carbomedics Annuloflex posterior annuloplasty band (size 79mm, catalog #[id number], serial #[id number])   Coronary Artery Bypass Grafting x 1              Left Internal Mammary Artery to Distal Left Anterior Descending Coronary Artery   Echo 06/10/17: Study Conclusions  - Left ventricle: The cavity size was normal. There was moderate   concentric hypertrophy. Systolic function was vigorous. The   estimated ejection fraction was in the range of 65% to 70%. Wall   motion was normal; there were no regional wall motion   abnormalities. Doppler parameters are consistent with abnormal   left ventricular relaxation (grade 1 diastolic dysfunction). - Aortic valve: Trileaflet; mildly thickened, mildly calcified   leaflets. Transvalvular velocity was minimally increased. There   was no stenosis. There was no regurgitation. - Mitral valve: S/P mitral valve repair with fixed posterior   leaflet. Transvalvular velocity was elevated. There was no   regurgitation. - Right ventricle: The cavity size was normal. Wall thickness was   normal. Systolic function was normal. - Tricuspid valve: There was no  regurgitation. - Pericardium, extracardiac: A mild pericardial effusion was   identified posterior to the heart. Features were not consistent   with tamponade physiology.  Impressions:  - Since the last study on 02/15/2017 mitral valve is post repair.   Transmitral velocities are elevated with mean gradient 8 mmHg.   LVEF is hyperdynamic.  Echo 02/07/18: Study Conclusions  - Left ventricle: The cavity size was mildly reduced. Wall   thickness was increased in a pattern of mild LVH. Systolic   function was vigorous. The estimated ejection fraction was in the   range of 65% to 70%. - Mitral valve: s/p MV repair. Motion is difficult to see due to   poor acoustic windows Peak and mean gradiaeants through the valve   are 7 and 4 mm Hg respectively. MVA  by P T1/2 is 1.75 cm2   consistent with mild MS - Right ventricle: RV is difficult to visualize, even with Definity   OVerall RVEF appears mildly reduced. The cavity size was mildly   dilated. - Right atrium: The atrium was mildly dilated.  ASSESSMENT:    1. S/P mitral valve repair   2. Chronic diastolic heart failure ([place])   3. Coronary artery disease involving coronary bypass graft of native heart without angina pectoris   4. History of pulmonary embolism   5. Chronic renal disease, stage 3, moderately decreased glomerular filtration rate (GFR) between 30-59 mL/min/1.73 square meter (HCC)      PLAN:  In order of problems listed above:  1. Severe MR s/p mitral valve annuloplasty/repair:  F/U Echo showed an excellent repair.  Recommend routine SBE prophylaxis.    2.   History of PE now recurrent on 02/06/18. Submassive with RV strain. S/p EKOS and lytic therapy. On Xarelto now.  Will need anticoagulation indefinitely.   3.    Chronic diastolic heart failure: weight is stable.  He does have chronic  edema. Recent BNP of 122 not really very impressive especially in setting of CKD. Prior to starting on diuretic therapy I would like  to repeat Echo. Continue sodium restriction and support hose. It is difficult to tell how much of his SOB is related to prior PEs, obesity, deconditioning, or CHF. Given CKD I would like to reassess Echo prior to starting diuretic therapy. Also on PFTs in 2018 there appeared to be a bronchospastic component so maybe a trial of inhaler therapy should be considered.  4.    S/p CABG x 1: LIMA to LAD  5.    Stage IV lung CA. S/p resection with recurrent malignant pleural effusion. On oral chemotherapy. Scans earlier this month show no progression.  6.   DM 2: Managed by primary care provider  7.   HLD off statin due to history of elevated CK on Lipitor. Will repeat CK now. If normalized we could start on low dose Crestor and titration slowly with close monitoring. Otherwise would need to consider zetia/bempidoic acid versus PCSK 9 inhibitor.   8. CKD stage 3. Reviewing records this really only started in summer of 2019. CT shows normal renal size.l this is a concern since he will be receiving serial CT scans with contrast to monitor his cancer. I would like for him to be evaluated by Renal. He is on no nephrotoxic drugs currently.    Medication Adjustments/Labs and Tests Ordered: Current medicines are reviewed at length with the patient today.  Concerns regarding medicines are outlined above.  Medication changes, Labs and Tests ordered today are listed in the Patient Instructions below. Patient Instructions  We will check your CPK level today  Hold off on taking Crestor or Demadex for now  We will schedule you for an Echocardiogram      Signed, Rickie Gange Martinique, MD  02/06/2019 4:42 PM    [facility] [address], [place], [place]  [address] Phone: [phone]; Fax: [phone]

## 2019-02-06 ENCOUNTER — Ambulatory Visit (INDEPENDENT_AMBULATORY_CARE_PROVIDER_SITE_OTHER): Payer: Medicare Other | Admitting: Cardiology

## 2019-02-06 ENCOUNTER — Other Ambulatory Visit: Payer: Self-pay

## 2019-02-06 ENCOUNTER — Encounter: Payer: Self-pay | Admitting: Cardiology

## 2019-02-06 VITALS — BP 137/74 | HR 63 | Temp 97.0°F | Ht 69.0 in | Wt 231.0 lb

## 2019-02-06 DIAGNOSIS — N183 Chronic kidney disease, stage 3 unspecified: Secondary | ICD-10-CM

## 2019-02-06 DIAGNOSIS — Z9889 Other specified postprocedural states: Secondary | ICD-10-CM

## 2019-02-06 DIAGNOSIS — I25810 Atherosclerosis of coronary artery bypass graft(s) without angina pectoris: Secondary | ICD-10-CM

## 2019-02-06 DIAGNOSIS — I5032 Chronic diastolic (congestive) heart failure: Secondary | ICD-10-CM | POA: Diagnosis not present

## 2019-02-06 DIAGNOSIS — Z86711 Personal history of pulmonary embolism: Secondary | ICD-10-CM | POA: Diagnosis not present

## 2019-02-06 MED ORDER — RIVAROXABAN 20 MG PO TABS: 20.0000 mg | ORAL_TABLET | Freq: Every day | ORAL | 3 refills | Status: DC

## 2019-02-06 MED ORDER — METOPROLOL TARTRATE 25 MG PO TABS: 12.5000 mg | ORAL_TABLET | Freq: Two times a day (BID) | ORAL | 3 refills | Status: DC

## 2019-02-06 NOTE — Patient Instructions (Signed)
We will check your CPK level today  Hold off on taking Crestor or Demadex for now  We will schedule you for an Echocardiogram

## 2019-02-07 ENCOUNTER — Telehealth: Payer: Self-pay | Admitting: Cardiology

## 2019-02-07 LAB — CK TOTAL AND CKMB (NOT AT ARMC)
CK-MB Index: 7.2 ng/mL (ref 0.0–10.4)
Total CK: 433 U/L — ABNORMAL HIGH (ref 41–331)

## 2019-02-07 NOTE — Telephone Encounter (Signed)
Faxed records to [facility] per referral from Dr. Peter Martinique.

## 2019-02-08 ENCOUNTER — Other Ambulatory Visit: Payer: Self-pay

## 2019-02-08 ENCOUNTER — Ambulatory Visit (HOSPITAL_COMMUNITY): Payer: Medicare Other | Attending: Cardiovascular Disease

## 2019-02-08 DIAGNOSIS — I25810 Atherosclerosis of coronary artery bypass graft(s) without angina pectoris: Secondary | ICD-10-CM | POA: Insufficient documentation

## 2019-02-08 DIAGNOSIS — I5032 Chronic diastolic (congestive) heart failure: Secondary | ICD-10-CM | POA: Insufficient documentation

## 2019-02-08 DIAGNOSIS — I2510 Atherosclerotic heart disease of native coronary artery without angina pectoris: Secondary | ICD-10-CM

## 2019-02-08 DIAGNOSIS — E785 Hyperlipidemia, unspecified: Secondary | ICD-10-CM

## 2019-02-08 DIAGNOSIS — Z9889 Other specified postprocedural states: Secondary | ICD-10-CM | POA: Insufficient documentation

## 2019-02-08 DIAGNOSIS — Z86711 Personal history of pulmonary embolism: Secondary | ICD-10-CM | POA: Diagnosis present

## 2019-02-08 MED ORDER — EZETIMIBE 10 MG PO TABS: 10.0000 mg | ORAL_TABLET | Freq: Every day | ORAL | 3 refills | Status: DC

## 2019-02-08 NOTE — Progress Notes (Signed)
zetia

## 2019-02-09 ENCOUNTER — Other Ambulatory Visit: Payer: Self-pay

## 2019-02-09 ENCOUNTER — Telehealth: Payer: Self-pay | Admitting: Cardiology

## 2019-02-09 DIAGNOSIS — Z86711 Personal history of pulmonary embolism: Secondary | ICD-10-CM

## 2019-02-09 DIAGNOSIS — R0602 Shortness of breath: Secondary | ICD-10-CM

## 2019-02-09 DIAGNOSIS — I5032 Chronic diastolic (congestive) heart failure: Secondary | ICD-10-CM

## 2019-02-09 MED ORDER — FUROSEMIDE 20 MG PO TABS: ORAL_TABLET | ORAL | 6 refills | Status: DC

## 2019-02-09 NOTE — Progress Notes (Signed)
Lasix

## 2019-02-09 NOTE — Telephone Encounter (Signed)
Patient was returning a call he received from [place]. Please call him back

## 2019-02-09 NOTE — Telephone Encounter (Signed)
Spoke to patient echo results given.Dr.Jordan advised to take Lasix 10 mg daily.Advised to see pulmonary.Stated he does not want to see Dr.Byrum.Advised scheduler will call back with appointment.

## 2019-02-14 NOTE — Patient Instructions (Addendum)
Thank you for visiting Dr. Tamala Julian at [facility]. Today we recommend the following:  - Try incruse, if it helps I can prescribe.  If not, don't worry about it - Continue the lasix - Continue to try to push activity - Repeat pulmonary function tests then f/u with me (around early Dec).

## 2019-02-14 NOTE — Progress Notes (Signed)
Synopsis: Referred in Oct 2020   Subjective:   PATIENT ID: James [name] GENDER: male DOB: 1950/08/21, MRN: [id number]  Chief Complaint  Patient presents with  . Consult    Former patient of IT consultant. He reports increased SOB, wheezing, and cough with clear mucous.     HPI 68 year old man with complex hx: - Slowly growing GG RUL nodule lost to f/u eventually found to be NSLC, resected lobectomy spring 2018 with surgical stage 1a disease.  Serial imaging for surveillance. - Summer 2018 provoked PE, placed on xarelto - Fall 2018 required mitral valve repair, CABG x 1 - Summer 2019 noted to have enlarging effusion with some pleural and ipsilateral PET uptake, effusion found to be EGFR+ lung adenocarcinoma, recurrence, started on targeted therapy with osimertinib - Fall 2019 had submassive PE requiring EKOS complicated by groin hematoma and bleeding renal cysts that did not require intervention, back on [facility] - Followed in pulmonary clinic by [place] previously for OSA, DOE and mixed obstructive/restrictive lung disorder.  Trial of symbicort resulted in throat irritation.  He is here for evaluation of dyspnea in setting of not being active all summer.  Also having some more leg swelling for which he was started on lasix.  He did have physical rehab but not since 2019.  There is no wheezing or chest pain with exertion, just fatigue and dyspnea.  He is wondering if it is okay to push himself.   Past Medical History:  Diagnosis Date  . Adenocarcinoma of right lung, stage 1 ([place]) 09/06/2016  . Anxiety   . Arthritis    "knees, hips, back" (10/19/2012)  . Chronic diastolic congestive heart failure ([place])   . Chronic lower back pain   . Colon polyps    10/27/2002, repeat letter 09/17/2007  . Coronary artery disease   . Coronary artery disease involving native coronary artery of native heart without angina pectoris   . Depressive disorder, not elsewhere classified    no meds  . Diabetes  mellitus without complication ([place])    diet controlled- no med  (while in hosp 4/18 -elevated cbg  . Dyspnea   . Fasting hyperglycemia   . GERD (gastroesophageal reflux disease)   . Heart murmur   . Hemoptysis    abnormal CT Chest 01/29/10 - ? new GG changes RUL > not viz on plain cxr 02/26/2010  . Hypertension   . Mitral regurgitation    severe MR 08/2016  . MPN (myeloproliferative neoplasm) ([place])    1st detected 06/04/1998  . Obesity   . OSA on CPAP    last sleep study 10 years ago  . Other and unspecified hyperlipidemia   . Peripheral vascular disease ([place]) 08/2016   after lung surgey small clots in lungs,after hip dvt-5/16  . Pneumonia    4/18  . Positive PPD 1965   "non reactive in 2012" (10/19/2012)  . Routine general medical examination at a health care facility   . S/P CABG x 1 03/24/2017   LIMA to LAD  . S/P mitral valve repair 03/24/2017   Complex valvuloplasty including artificial Gore-tex neochord placement x6 and 28 mm Sorin Annuloflex posterior annuloplasty band  . Special screening for malignant neoplasm of prostate   . Spinal stenosis, unspecified region other than cervical   . Wrist pain, left      Family History  Problem Relation Age of Onset  . Heart failure Mother   . Heart disease Father   . Heart  attack Father   . Heart disease Paternal Uncle   . Prostate cancer Neg Hx   . Colon cancer Neg Hx   . Hypertension Neg Hx   . Hyperlipidemia Neg Hx   . Diabetes Neg Hx      Past Surgical History:  Procedure Laterality Date  . ANTERIOR CRUCIATE LIGAMENT REPAIR Left 1967  . CARDIAC CATHETERIZATION  2000  . CHEST TUBE INSERTION Right 10/19/2012   post bronch  . COLONOSCOPY W/ POLYPECTOMY    . CORONARY ARTERY BYPASS GRAFT N/A 03/24/2017   Procedure: CORONARY ARTERY BYPASS GRAFTING (CABG)x1 using left internal mammary artery, LIMA-LAD;  Surgeon: Rexene Alberts, MD;  Location: [place];  Service: Open Heart Surgery;  Laterality: N/A;  . FLEXIBLE BRONCHOSCOPY   10/19/2012   Flexible video fiberoptic bronchoscopy with electromagnetic navigation and biopsies.  . INTRAVASCULAR PRESSURE WIRE/FFR STUDY N/A 08/11/2016   Procedure: Intravascular Pressure Wire/FFR Study;  Surgeon: [name], MD;  Location: [place] [facility];  Service: Cardiovascular;  Laterality: N/A;  . IR ANGIOGRAM PULMONARY BILATERAL SELECTIVE  02/06/2018  . IR ANGIOGRAM SELECTIVE EACH ADDITIONAL VESSEL  02/06/2018  . IR ANGIOGRAM SELECTIVE EACH ADDITIONAL VESSEL  02/06/2018  . IR INFUSION THROMBOL ARTERIAL INITIAL (MS)  02/06/2018  . IR INFUSION THROMBOL ARTERIAL INITIAL (MS)  02/06/2018  . IR THROMB F/U EVAL ART/VEN FINAL DAY (MS)  02/07/2018  . IR US GUIDE VASC ACCESS RIGHT  02/06/2018  . LOBECTOMY Right 09/02/2016   Procedure: RIGHT UPPER LOBECTOMY;  Surgeon: Melrose Nakayama, MD;  Location: [place];  Service: Thoracic;  Laterality: Right;  . LYMPH NODE DISSECTION Right 09/02/2016   Procedure: LYMPH NODE DISSECTION, RIGHT LUNG;  Surgeon: Melrose Nakayama, MD;  Location: [place];  Service: Thoracic;  Laterality: Right;  . MITRAL VALVE REPAIR N/A 03/24/2017   Procedure: MITRAL VALVE REPAIR (MVR) with Sorin Carbomedics Annuloflex size 28;  Surgeon: Rexene Alberts, MD;  Location: [place];  Service: Open Heart Surgery;  Laterality: N/A;  . RIGHT/LEFT HEART CATH AND CORONARY ANGIOGRAPHY N/A 08/11/2016   Procedure: Right/Left Heart Cath and Coronary Angiography;  Surgeon: [name], MD;  Location: [place] [facility];  Service: Cardiovascular;  Laterality: N/A;  . TEE WITHOUT CARDIOVERSION N/A 07/17/2016   Procedure: TRANSESOPHAGEAL ECHOCARDIOGRAM (TEE);  Surgeon: [name], MD;  Location[facility];  Service: Cardiovascular;  Laterality: N/A;  . TEE WITHOUT CARDIOVERSION N/A 03/24/2017   Procedure: TRANSESOPHAGEAL ECHOCARDIOGRAM (TEE);  Surgeon: Rexene Alberts, MD;  Location: [place];  Service: Open Heart Surgery;  Laterality: N/A;  . TONSILLECTOMY  1950's  . TOTAL HIP ARTHROPLASTY  Left 10/05/2014   dr [name]  . TOTAL HIP ARTHROPLASTY Left 10/05/2014   Procedure: LEFT TOTAL HIP ARTHROPLASTY ANTERIOR APPROACH;  Surgeon: [name], MD;  Location: [place];  Service: Orthopedics;  Laterality: Left;  Marland Kitchen VIDEO ASSISTED THORACOSCOPY (VATS)/WEDGE RESECTION Right 09/02/2016   Procedure: VIDEO ASSISTED THORACOSCOPY (VATS)/RIGHT UPPER LOBE WEDGE RESECTION;  Surgeon: Melrose Nakayama, MD;  Location: [place];  Service: Thoracic;  Laterality: Right;  Marland Kitchen VIDEO BRONCHOSCOPY WITH ENDOBRONCHIAL NAVIGATION N/A 10/19/2012   Procedure: VIDEO BRONCHOSCOPY WITH ENDOBRONCHIAL NAVIGATION;  Surgeon: [name], MD;  Location: [place];  Service: Thoracic;  Laterality: N/A;  . WRIST RECONSTRUCTION Left 12/2009   'proximal row carpectomy" [place] History   Socioeconomic History  . Marital status: Married    Spouse name: Not on file  . Number of children: 3  . Years  of education: Not on file  . Highest education level: Not on file  Occupational History  . Occupation: Retired, Financial risk analyst  . Occupation: Retired, Real estate    Comment: [employer]  . Financial resource strain: Not on file  . Food insecurity    Worry: Patient refused    Inability: Patient refused  . Transportation needs    Medical: Patient refused    Non-medical: Patient refused  Tobacco Use  . Smoking status: Never Smoker  . Smokeless tobacco: Never Used  Substance and Sexual Activity  . Alcohol use: Yes    Alcohol/week: 1.0 standard drinks    Types: 1 Cans of beer per week    Comment: none since 3 weeks  . Drug use: No  . Sexual activity: Never    Partners: Female  Lifestyle  . Physical activity    Days per week: Patient refused    Minutes per session: Patient refused  . Stress: Not on file  Relationships  . Social Herbalist on phone: Not on file    Gets together: Not on file    Attends religious service: Not on file    Active member of club or organization: Not on file     Attends meetings of clubs or organizations: Not on file    Relationship status: Not on file  . Intimate partner violence    Fear of current or ex partner: Not on file    Emotionally abused: Not on file    Physically abused: Not on file    Forced sexual activity: Not on file  Other Topics Concern  . Not on file  Social History Narrative   HSG, [employer]. Married '73.  2 sons - '74,   '76;  1 daughter -  '77  6 grandchildren.   Work - IT consultant now retired. ACP - not fully discussed.                     Allergies  Allergen Reactions  . Symbicort [Budesonide-Formoterol Fumarate] Other (See Comments) and Cough    Chest pain, wheezing   . Amoxicillin Itching and Other (See Comments)    Has patient had a PCN reaction causing immediate rash, facial/tongue/throat swelling, SOB or lightheadedness with hypotension: No Has patient had a PCN reaction causing severe rash involving mucus membranes or skin necrosis: No Has patient had a PCN reaction that required hospitalization No Has patient had a PCN reaction occurring within the last 10 years: No If all of the above answers are "NO", then may proceed with Cephalosporin use.      Outpatient Medications Prior to Visit  Medication Sig Dispense Refill  . ezetimibe (ZETIA) 10 MG tablet Take 1 tablet (10 mg total) by mouth daily. 90 tablet 3  . furosemide (LASIX) 20 MG tablet Take 1/2 tablet 10 mg daily 30 tablet 6  . metoprolol tartrate (LOPRESSOR) 25 MG tablet Take 0.5 tablets (12.5 mg total) by mouth 2 (two) times daily. 90 tablet 3  . Omega-3 Fatty Acids (FISH OIL) 500 MG CAPS Take 500 mg by mouth daily.     . rivaroxaban (XARELTO) 20 MG TABS tablet Take 1 tablet (20 mg total) by mouth daily with supper. 90 tablet 3  . sildenafil (REVATIO) 20 MG tablet Take 4 tablets (80 mg total) by mouth daily as needed. 60 tablet 5  . TAGRISSO 80 MG tablet TAKE 1 TABLET (80 MG TOTAL) BY MOUTH  DAILY. 30 tablet 2   No  facility-administered medications prior to visit.      Positive Symptoms in bold: ROS + symptoms in bold Fevers, chills, weight loss Nausea, vomiting, diarrhea Shortness of breath, wheezing, cough Chest pain, palpitations, lower ext edema   Objective:  GEN: obese man in NAD HEENT: MMM, no thrush CV: RRR, systolic murmur, ext warm PULM: Clear, no accessory muscle use GI: Protuberant but soft, +BS EXT: 2+ LE edema to thighs NEURO: Moves all 4 ext to command, ambulates normally PSYCH: AOx3, excellent insight SKIN: No rashes  We walked around the clinic, he did quite well, no wheezing with exertion just labored breathing.  Did not desaturate below 92%, HR in 110-120 range.    Vitals:   02/16/19 1359  BP: 140/82  Pulse: 68  Temp: 97.6 F (36.4 C)  TempSrc: Temporal  SpO2: 95%  Weight: 228 lb 6.4 oz (103.6 kg)  Height: 5' 9"  (1.753 m)   95% on  RA BMI Readings from Last 3 Encounters:  02/16/19 33.73 kg/m  02/06/19 34.11 kg/m  01/23/19 34.20 kg/m   Wt Readings from Last 3 Encounters:  02/16/19 228 lb 6.4 oz (103.6 kg)  02/06/19 231 lb (104.8 kg)  01/23/19 231 lb 9.6 oz (105.1 kg)     CBC    Component Value Date/Time   WBC 6.4 01/20/2019 0950   WBC 5.2 03/17/2018 1015   RBC 4.98 01/20/2019 0950   HGB 15.6 01/20/2019 0950   HGB 14.0 10/01/2016 1534   HCT 46.9 01/20/2019 0950   HCT 42.6 10/01/2016 1534   PLT 132 (L) 01/20/2019 0950   PLT 191 10/01/2016 1534   MCV 94.2 01/20/2019 0950   MCV 91.6 10/01/2016 1534   MCH 31.3 01/20/2019 0950   MCHC 33.3 01/20/2019 0950   RDW 14.1 01/20/2019 0950   RDW 14.2 10/01/2016 1534   LYMPHSABS 1.2 01/20/2019 0950   LYMPHSABS 1.5 10/01/2016 1534   MONOABS 0.6 01/20/2019 0950   MONOABS 0.6 10/01/2016 1534   EOSABS 0.2 01/20/2019 0950   EOSABS 0.3 10/01/2016 1534   BASOSABS 0.0 01/20/2019 0950   BASOSABS 0.0 10/01/2016 1534     Chest Imaging: CT Chest 2020 No intrinsic lung disease, small R loculated  effusion, no concerning findings for disease progression, stable volume loss on R post lobectomy  Pulmonary Functions Testing Results: PFT Results Latest Ref Rng & Units 02/16/2017  FVC-Pre L 2.19  FVC-Predicted Pre % 50  FVC-Post L 2.28  FVC-Predicted Post % 52  Pre FEV1/FVC % % 72  Post FEV1/FCV % % 77  FEV1-Pre L 1.58  FEV1-Predicted Pre % 49  FEV1-Post L 1.76  DLCO UNC% % 72  DLCO COR %Predicted % 127  TLC L 6.11  TLC % Predicted % 89  RV % Predicted % 160    FeNO: NA  Pathology: NA  Echocardiogram:  Echo Sept 2020  1. Left ventricular ejection fraction, by visual estimation, is 50 to 55%. The left ventricle has normal function. Normal left ventricular size. Left ventricular septal wall thickness was mildly increased. Mildly increased left ventricular posterior  wall thickness. There is mildly increased left ventricular hypertrophy.  2. Elevated left ventricular end-diastolic pressure.  3. Left ventricular diastolic Doppler parameters are consistent with pseudonormalization pattern of LV diastolic filling.  4. Global right ventricle has normal systolic function.The right ventricular size is normal. No increase in right ventricular wall thickness.  5. Left atrial size was normal.  6. Right atrial size was normal.  7. The mitral valve is normal in structure. Mild mitral valve regurgitation. Mild mitral stenosis.  8. The tricuspid valve is normal in structure. Tricuspid valve regurgitation is mild.  9. The aortic valve is normal in structure. Aortic valve regurgitation was not visualized by color flow Doppler. Structurally normal aortic valve, with no evidence of sclerosis or stenosis. 10. Mild thickening and calcification of the aortic valve leaflets. 11. The pulmonic valve was normal in structure. Pulmonic valve regurgitation is trivial by color flow Doppler. 12. Normal pulmonary artery systolic pressure. 13. The inferior vena cava is normal in size with greater than 50%  respiratory variability, suggesting right atrial pressure of 3 mmHg.  Heart Catheterization: 2018 pre CABG 1. Borderline single vessel obstructive CAD involving the mid LAD. FFR 0.81 2. Normal LV function EF 65% 3. Normal right heart and LV filling pressures 4. Normal Cardiac output    Assessment & Plan:   # DOE- sounds c/w mostly deconditioning and maybe having some extra volume on board.  He does not really have any intrinsic lung disease.  Echo benign. 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  ezetimibe (ZETIA) 10 MG tablet, Take 1 tablet (10 mg total) by mouth daily., Disp: 90 tablet, Rfl: 3 .  furosemide (LASIX) 20 MG tablet, Take 1/2 tablet 10 mg daily, Disp: 30 tablet, Rfl: 6 .  metoprolol tartrate (LOPRESSOR) 25 MG tablet, Take 0.5 tablets (12.5 mg total) by mouth 2 (two) times daily., Disp: 90 tablet, Rfl: 3 .  Omega-3 Fatty Acids (FISH OIL) 500 MG CAPS, Take 500 mg by mouth daily. , Disp: , Rfl:  .  rivaroxaban (XARELTO) 20 MG TABS tablet, Take 1 tablet (20 mg total) by mouth daily with supper., Disp: 90 tablet, Rfl: 3 .  sildenafil (REVATIO) 20 MG tablet, Take 4 tablets (80 mg total) by mouth daily as needed., Disp: 60 tablet, Rfl: 5 .  TAGRISSO 80 MG tablet, TAKE 1 TABLET (80 MG TOTAL) BY MOUTH DAILY., Disp: 30 tablet, Rfl: 2   Candee Furbish, MD [facility] 02/16/2019 2:17 PM

## 2019-02-16 ENCOUNTER — Ambulatory Visit: Payer: Medicare Other | Admitting: Internal Medicine

## 2019-02-16 ENCOUNTER — Other Ambulatory Visit: Payer: Self-pay

## 2019-02-16 ENCOUNTER — Encounter: Payer: Self-pay | Admitting: Internal Medicine

## 2019-02-16 VITALS — BP 140/82 | HR 68 | Temp 97.6°F | Ht 69.0 in | Wt 228.4 lb

## 2019-02-16 DIAGNOSIS — R0600 Dyspnea, unspecified: Secondary | ICD-10-CM | POA: Diagnosis not present

## 2019-02-16 DIAGNOSIS — R0609 Other forms of dyspnea: Secondary | ICD-10-CM

## 2019-02-16 MED ORDER — UMECLIDINIUM BROMIDE 62.5 MCG/INH IN AEPB: 1.0000 | INHALATION_SPRAY | Freq: Every day | RESPIRATORY_TRACT | 0 refills | Status: AC

## 2019-02-20 ENCOUNTER — Other Ambulatory Visit (HOSPITAL_COMMUNITY): Payer: Medicare Other

## 2019-02-21 ENCOUNTER — Other Ambulatory Visit (HOSPITAL_COMMUNITY): Payer: Medicare Other

## 2019-02-22 MED FILL — TAGRISSO 80 MG TABLET: 80 | 30 days supply | Qty: 30 | Fill #1

## 2019-03-06 ENCOUNTER — Other Ambulatory Visit: Payer: Self-pay

## 2019-03-06 ENCOUNTER — Inpatient Hospital Stay: Payer: Medicare Other

## 2019-03-06 ENCOUNTER — Encounter: Payer: Self-pay | Admitting: Internal Medicine

## 2019-03-06 ENCOUNTER — Inpatient Hospital Stay: Payer: Medicare Other | Attending: Internal Medicine | Admitting: Internal Medicine

## 2019-03-06 VITALS — BP 137/77 | HR 86 | Temp 98.3°F | Resp 18 | Ht 69.0 in | Wt 231.9 lb

## 2019-03-06 DIAGNOSIS — Z5111 Encounter for antineoplastic chemotherapy: Secondary | ICD-10-CM | POA: Diagnosis not present

## 2019-03-06 DIAGNOSIS — Z79899 Other long term (current) drug therapy: Secondary | ICD-10-CM | POA: Insufficient documentation

## 2019-03-06 DIAGNOSIS — C3491 Malignant neoplasm of unspecified part of right bronchus or lung: Secondary | ICD-10-CM | POA: Diagnosis not present

## 2019-03-06 DIAGNOSIS — I10 Essential (primary) hypertension: Secondary | ICD-10-CM | POA: Insufficient documentation

## 2019-03-06 DIAGNOSIS — C3411 Malignant neoplasm of upper lobe, right bronchus or lung: Secondary | ICD-10-CM | POA: Insufficient documentation

## 2019-03-06 DIAGNOSIS — C3490 Malignant neoplasm of unspecified part of unspecified bronchus or lung: Secondary | ICD-10-CM

## 2019-03-06 DIAGNOSIS — I2699 Other pulmonary embolism without acute cor pulmonale: Secondary | ICD-10-CM | POA: Diagnosis not present

## 2019-03-06 DIAGNOSIS — E119 Type 2 diabetes mellitus without complications: Secondary | ICD-10-CM | POA: Insufficient documentation

## 2019-03-06 DIAGNOSIS — J90 Pleural effusion, not elsewhere classified: Secondary | ICD-10-CM | POA: Diagnosis not present

## 2019-03-06 DIAGNOSIS — Z7901 Long term (current) use of anticoagulants: Secondary | ICD-10-CM | POA: Insufficient documentation

## 2019-03-06 DIAGNOSIS — Z902 Acquired absence of lung [part of]: Secondary | ICD-10-CM | POA: Insufficient documentation

## 2019-03-06 LAB — CMP (CANCER CENTER ONLY)
ALT: 45 U/L — ABNORMAL HIGH (ref 0–44)
AST: 33 U/L (ref 15–41)
Albumin: 3.9 g/dL (ref 3.5–5.0)
Alkaline Phosphatase: 54 U/L (ref 38–126)
Anion gap: 8 (ref 5–15)
BUN: 23 mg/dL (ref 8–23)
CO2: 25 mmol/L (ref 22–32)
Calcium: 9 mg/dL (ref 8.9–10.3)
Chloride: 107 mmol/L (ref 98–111)
Creatinine: 1.42 mg/dL — ABNORMAL HIGH (ref 0.61–1.24)
GFR, Est AFR Am: 58 mL/min — ABNORMAL LOW (ref >60)
GFR, Estimated: 50 mL/min — ABNORMAL LOW (ref >60)
Glucose, Bld: 111 mg/dL — ABNORMAL HIGH (ref 70–99)
Potassium: 4.7 mmol/L (ref 3.5–5.1)
Sodium: 140 mmol/L (ref 135–145)
Total Bilirubin: 0.4 mg/dL (ref 0.3–1.2)
Total Protein: 6.8 g/dL (ref 6.5–8.1)

## 2019-03-06 LAB — CBC WITH DIFFERENTIAL (CANCER CENTER ONLY)
Abs Immature Granulocytes: 0.02 10*3/uL (ref 0.00–0.07)
Basophils Absolute: 0 10*3/uL (ref 0.0–0.1)
Basophils Relative: 1 %
Eosinophils Absolute: 0.1 10*3/uL (ref 0.0–0.5)
Eosinophils Relative: 2 %
HCT: 47 % (ref 39.0–52.0)
Hemoglobin: 15.4 g/dL (ref 13.0–17.0)
Immature Granulocytes: 0 %
Lymphocytes Relative: 22 %
Lymphs Abs: 1.3 10*3/uL (ref 0.7–4.0)
MCH: 30.9 pg (ref 26.0–34.0)
MCHC: 32.8 g/dL (ref 30.0–36.0)
MCV: 94.2 fL (ref 80.0–100.0)
Monocytes Absolute: 0.7 10*3/uL (ref 0.1–1.0)
Monocytes Relative: 12 %
Neutro Abs: 3.9 10*3/uL (ref 1.7–7.7)
Neutrophils Relative %: 63 %
Platelet Count: 135 10*3/uL — ABNORMAL LOW (ref 150–400)
RBC: 4.99 MIL/uL (ref 4.22–5.81)
RDW: 13.9 % (ref 11.5–15.5)
WBC Count: 6.1 10*3/uL (ref 4.0–10.5)
nRBC: 0 % (ref 0.0–0.2)

## 2019-03-06 NOTE — Progress Notes (Signed)
[facility] [phone]   [phone]  OFFICE PROGRESS NOTE  [name], MD [address] [facility] [address] [place] [address]  DIAGNOSIS: Recurrent non-small cell lung cancer, adenocarcinoma initially diagnosed as stage IA (T1c, N0, M0) non-small cell lung cancer, adenocarcinoma presented with right upper lobe lung nodule in April 2018 with recurrence in June 2019.  Biomarker Findings Microsatellite status - MS-Stable Tumor Mutational Burden - TMB-Low (3 Muts/Mb) Genomic Findings For a complete list of the genes assayed, please refer to the Appendix. EGFR exon 19 deletion ([id number]>F) CDKN2A/B loss NKX2-1 amplification 7 Disease relevant genes with no reportable alterations: KRAS, ALK, BRAF, MET, RET, ERBB2, ROS1   PRIOR THERAPY: Status post right upper lobectomy with lymph node dissection under the care of Dr. [name] on 09/02/2016.  CURRENT THERAPY: Tagrisso 80 mg p.o. daily.  First dose started November 05, 2017.  Status post 16 months of treatment.  INTERVAL HISTORY: James [name] 68 y.o. male returns to the clinic today for follow-up visit.  The patient is feeling fine today with no concerning complaints except for occasional gum bleed.  He is currently on Xarelto.  He denied having any recent chest pain, shortness of breath, cough or hemoptysis.  He denied having any fever or chills.  The patient denied having any nausea, vomiting, diarrhea or constipation.  He has no headache or visual changes.  He continues to tolerate his treatment with Tagrisso fairly well.  He is here today for evaluation and repeat blood work.  MEDICAL HISTORY: Past Medical History:  Diagnosis Date  . Adenocarcinoma of right lung, stage 1 ([place]) 09/06/2016  . Anxiety   . Arthritis    "knees, hips, back" (10/19/2012)  . Chronic diastolic congestive heart failure ([place])   . Chronic lower back pain   . Colon polyps    10/27/2002, repeat letter 09/17/2007  .  Coronary artery disease   . Coronary artery disease involving native coronary artery of native heart without angina pectoris   . Depressive disorder, not elsewhere classified    no meds  . Diabetes mellitus without complication ([place])    diet controlled- no med  (while in hosp 4/18 -elevated cbg  . Dyspnea   . Fasting hyperglycemia   . GERD (gastroesophageal reflux disease)   . Heart murmur   . Hemoptysis    abnormal CT Chest 01/29/10 - ? new GG changes RUL > not viz on plain cxr 02/26/2010  . Hypertension   . Mitral regurgitation    severe MR 08/2016  . MPN (myeloproliferative neoplasm) ([place])    1st detected 06/04/1998  . Obesity   . OSA on CPAP    last sleep study 10 years ago  . Other and unspecified hyperlipidemia   . Peripheral vascular disease ([place]) 08/2016   after lung surgey small clots in lungs,after hip dvt-5/16  . Pneumonia    4/18  . Positive PPD 1965   "non reactive in 2012" (10/19/2012)  . Routine general medical examination at a health care facility   . S/P CABG x 1 03/24/2017   LIMA to LAD  . S/P mitral valve repair 03/24/2017   Complex valvuloplasty including artificial Gore-tex neochord placement x6 and 28 mm Sorin Annuloflex posterior annuloplasty band  . Special screening for malignant neoplasm of prostate   . Spinal stenosis, unspecified region other than cervical   . Wrist pain, left     ALLERGIES:  is allergic to symbicort [budesonide-formoterol fumarate]  and amoxicillin.  MEDICATIONS:  Current Outpatient Medications  Medication Sig Dispense Refill  . ezetimibe (ZETIA) 10 MG tablet Take 1 tablet (10 mg total) by mouth daily. 90 tablet 3  . furosemide (LASIX) 20 MG tablet Take 1/2 tablet 10 mg daily 30 tablet 6  . metoprolol tartrate (LOPRESSOR) 25 MG tablet Take 0.5 tablets (12.5 mg total) by mouth 2 (two) times daily. 90 tablet 3  . Omega-3 Fatty Acids (FISH OIL) 500 MG CAPS Take 500 mg by mouth daily.     . rivaroxaban (XARELTO) 20 MG TABS tablet Take  1 tablet (20 mg total) by mouth daily with supper. 90 tablet 3  . sildenafil (REVATIO) 20 MG tablet Take 4 tablets (80 mg total) by mouth daily as needed. 60 tablet 5  . TAGRISSO 80 MG tablet TAKE 1 TABLET (80 MG TOTAL) BY MOUTH DAILY. 30 tablet 2  . umeclidinium bromide (INCRUSE ELLIPTA) 62.5 MCG/INH AEPB Inhale 1 puff into the lungs daily. 1 each 0   No current facility-administered medications for this visit.     SURGICAL HISTORY:  Past Surgical History:  Procedure Laterality Date  . ANTERIOR CRUCIATE LIGAMENT REPAIR Left 1967  . CARDIAC CATHETERIZATION  2000  . CHEST TUBE INSERTION Right 10/19/2012   post bronch  . COLONOSCOPY W/ POLYPECTOMY    . CORONARY ARTERY BYPASS GRAFT N/A 03/24/2017   Procedure: CORONARY ARTERY BYPASS GRAFTING (CABG)x1 using left internal mammary artery, LIMA-LAD;  Surgeon: Rexene Alberts, MD;  Location: [facility];  Service: Open Heart Surgery;  Laterality: N/A;  . FLEXIBLE BRONCHOSCOPY  10/19/2012   Flexible video fiberoptic bronchoscopy with electromagnetic navigation and biopsies.  . INTRAVASCULAR PRESSURE WIRE/FFR STUDY N/A 08/11/2016   Procedure: Intravascular Pressure Wire/FFR Study;  Surgeon: [name], MD;  Location: [facility];  Service: Cardiovascular;  Laterality: N/A;  . IR ANGIOGRAM PULMONARY BILATERAL SELECTIVE  02/06/2018  . IR ANGIOGRAM SELECTIVE EACH ADDITIONAL VESSEL  02/06/2018  . IR ANGIOGRAM SELECTIVE EACH ADDITIONAL VESSEL  02/06/2018  . IR INFUSION THROMBOL ARTERIAL INITIAL (MS)  02/06/2018  . IR INFUSION THROMBOL ARTERIAL INITIAL (MS)  02/06/2018  . IR THROMB F/U EVAL ART/VEN FINAL DAY (MS)  02/07/2018  . IR US GUIDE VASC ACCESS RIGHT  02/06/2018  . LOBECTOMY Right 09/02/2016   Procedure: RIGHT UPPER LOBECTOMY;  Surgeon: Melrose Nakayama, MD;  Location: [facility];  Service: Thoracic;  Laterality: Right;  . LYMPH NODE DISSECTION Right 09/02/2016   Procedure: LYMPH NODE DISSECTION, RIGHT LUNG;  Surgeon: Melrose Nakayama, MD;   Location: [facility];  Service: Thoracic;  Laterality: Right;  . MITRAL VALVE REPAIR N/A 03/24/2017   Procedure: MITRAL VALVE REPAIR (MVR) with Sorin Carbomedics Annuloflex size 28;  Surgeon: Rexene Alberts, MD;  Location: [facility];  Service: Open Heart Surgery;  Laterality: N/A;  . RIGHT/LEFT HEART CATH AND CORONARY ANGIOGRAPHY N/A 08/11/2016   Procedure: Right/Left Heart Cath and Coronary Angiography;  Surgeon: [name], MD;  Location: [facility];  Service: Cardiovascular;  Laterality: N/A;  . TEE WITHOUT CARDIOVERSION N/A 07/17/2016   Procedure: TRANSESOPHAGEAL ECHOCARDIOGRAM (TEE);  Surgeon: [name], MD;  Location[facility];  Service: Cardiovascular;  Laterality: N/A;  . TEE WITHOUT CARDIOVERSION N/A 03/24/2017   Procedure: TRANSESOPHAGEAL ECHOCARDIOGRAM (TEE);  Surgeon: Rexene Alberts, MD;  Location: [facility];  Service: Open Heart Surgery;  Laterality: N/A;  . TONSILLECTOMY  1950's  . TOTAL HIP ARTHROPLASTY Left 10/05/2014   dr [name]  . TOTAL  HIP ARTHROPLASTY Left 10/05/2014   Procedure: LEFT TOTAL HIP ARTHROPLASTY ANTERIOR APPROACH;  Surgeon: [name], MD;  Location: [facility];  Service: Orthopedics;  Laterality: Left;  Marland Kitchen VIDEO ASSISTED THORACOSCOPY (VATS)/WEDGE RESECTION Right 09/02/2016   Procedure: VIDEO ASSISTED THORACOSCOPY (VATS)/RIGHT UPPER LOBE WEDGE RESECTION;  Surgeon: Melrose Nakayama, MD;  Location: [facility];  Service: Thoracic;  Laterality: Right;  Marland Kitchen VIDEO BRONCHOSCOPY WITH ENDOBRONCHIAL NAVIGATION N/A 10/19/2012   Procedure: VIDEO BRONCHOSCOPY WITH ENDOBRONCHIAL NAVIGATION;  Surgeon: [name], MD;  Location: [facility];  Service: Thoracic;  Laterality: N/A;  . WRIST RECONSTRUCTION Left 12/2009   'proximal row carpectomy" [name]    REVIEW OF SYSTEMS:  A comprehensive review of systems was negative except for: Ears, nose, mouth, throat, and face: positive for Gum bleed.   PHYSICAL EXAMINATION: General appearance: alert, cooperative and no distress Head:  Normocephalic, without obvious abnormality, atraumatic Neck: no adenopathy, no JVD, supple, symmetrical, trachea midline and thyroid not enlarged, symmetric, no tenderness/mass/nodules Lymph nodes: Cervical, supraclavicular, and axillary nodes normal. Resp: clear to auscultation bilaterally Back: symmetric, no curvature. ROM normal. No CVA tenderness. Cardio: regular rate and rhythm, S1, S2 normal, no murmur, click, rub or gallop GI: soft, non-tender; bowel sounds normal; no masses,  no organomegaly Extremities: extremities normal, atraumatic, no cyanosis or edema  ECOG PERFORMANCE STATUS: 1 - Symptomatic but completely ambulatory  Blood pressure 137/77, pulse 86, temperature 98.3 F (36.8 C), temperature source Temporal, resp. rate 18, height 5' 9" (1.753 m), weight 231 lb 14.4 oz (105.2 kg), SpO2 99 %.  LABORATORY DATA: Lab Results  Component Value Date   WBC 6.1 03/06/2019   HGB 15.4 03/06/2019   HCT 47.0 03/06/2019   MCV 94.2 03/06/2019   PLT 135 (L) 03/06/2019      Chemistry      Component Value Date/Time   NA 139 01/20/2019 0950   NA 142 10/01/2016 1534   K 4.1 01/20/2019 0950   K 4.2 10/01/2016 1534   CL 107 01/20/2019 0950   CO2 23 01/20/2019 0950   CO2 28 10/01/2016 1534   BUN 17 01/20/2019 0950   BUN 15.9 10/01/2016 1534   CREATININE 1.43 (H) 01/20/2019 0950   CREATININE 1.10 02/08/2017 1101   CREATININE 1.5 (H) 10/01/2016 1534      Component Value Date/Time   CALCIUM 9.1 01/20/2019 0950   CALCIUM 9.6 10/01/2016 1534   ALKPHOS 52 01/20/2019 0950   ALKPHOS 65 10/01/2016 1534   AST 27 01/20/2019 0950   AST 20 10/01/2016 1534   ALT 28 01/20/2019 0950   ALT 24 10/01/2016 1534   BILITOT 0.5 01/20/2019 0950   BILITOT 0.46 10/01/2016 1534       RADIOGRAPHIC STUDIES: No results found.  ASSESSMENT AND PLAN: This is a very pleasant 68 years old white male with a stage Ia non-small cell lung cancer status post right upper lobectomy with lymph node dissection  on September 02, 2016. The patient he was on observation since that time. Unfortunately there are some concerning findings with nodular density in the posterior right lower lobe as well as increase and right pleural effusion suspicious for disease recurrence. He underwent ultrasound-guided right thoracentesis and the cytology of the pleural fluid was consistent with recurrent adenocarcinoma. He had molecular studies performed by [facility] 1 that showed positive EGFR mutation with deletion 48. The patient was started on treatment with Tagrisso 80 mg p.o. daily on 11/05/2017.  He status post 16 months of treatment. The patient continues to tolerate  this treatment well. I recommended for him to continue his current treatment with Tagrisso with the same dose. I will see him back for follow-up visit in 6 weeks for evaluation with repeat CT scan of the chest, abdomen and pelvis for restaging of his disease. He was advised to call immediately if he has any concerning symptoms in the interval. The patient voices understanding of current disease status and treatment options and is in agreement with the current care plan. All questions were answered. The patient knows to call the clinic with any problems, questions or concerns. We can certainly see the patient much sooner if necessary.  Disclaimer: This note was dictated with voice recognition software. Similar sounding words can inadvertently be transcribed and may not be corrected upon review.

## 2019-03-07 ENCOUNTER — Telehealth: Payer: Self-pay | Admitting: Internal Medicine

## 2019-03-07 NOTE — Telephone Encounter (Signed)
Scheduled per los. Called and spoke with patient. Confirmed appt 

## 2019-03-10 ENCOUNTER — Other Ambulatory Visit: Payer: Self-pay

## 2019-03-10 ENCOUNTER — Ambulatory Visit (INDEPENDENT_AMBULATORY_CARE_PROVIDER_SITE_OTHER): Payer: Medicare Other

## 2019-03-10 DIAGNOSIS — Z23 Encounter for immunization: Secondary | ICD-10-CM

## 2019-03-21 MED FILL — TAGRISSO 80 MG TABLET: 80 | 30 days supply | Qty: 30 | Fill #2

## 2019-04-14 ENCOUNTER — Ambulatory Visit (HOSPITAL_COMMUNITY)
Admission: RE | Admit: 2019-04-14 | Discharge: 2019-04-14 | Disposition: A | Discharge status: Home / Self Care | Payer: Medicare Other | Source: Ambulatory Visit | Attending: Internal Medicine | Admitting: Internal Medicine

## 2019-04-14 ENCOUNTER — Other Ambulatory Visit: Payer: Self-pay

## 2019-04-14 ENCOUNTER — Inpatient Hospital Stay: Payer: Medicare Other | Attending: Internal Medicine

## 2019-04-14 DIAGNOSIS — I110 Hypertensive heart disease with heart failure: Secondary | ICD-10-CM | POA: Insufficient documentation

## 2019-04-14 DIAGNOSIS — C3490 Malignant neoplasm of unspecified part of unspecified bronchus or lung: Secondary | ICD-10-CM

## 2019-04-14 DIAGNOSIS — C3411 Malignant neoplasm of upper lobe, right bronchus or lung: Secondary | ICD-10-CM | POA: Diagnosis not present

## 2019-04-14 DIAGNOSIS — E119 Type 2 diabetes mellitus without complications: Secondary | ICD-10-CM | POA: Insufficient documentation

## 2019-04-14 DIAGNOSIS — Z7901 Long term (current) use of anticoagulants: Secondary | ICD-10-CM | POA: Diagnosis not present

## 2019-04-14 DIAGNOSIS — Z79899 Other long term (current) drug therapy: Secondary | ICD-10-CM | POA: Insufficient documentation

## 2019-04-14 DIAGNOSIS — E669 Obesity, unspecified: Secondary | ICD-10-CM | POA: Insufficient documentation

## 2019-04-14 LAB — CMP (CANCER CENTER ONLY)
ALT: 40 U/L (ref 0–44)
AST: 37 U/L (ref 15–41)
Albumin: 3.9 g/dL (ref 3.5–5.0)
Alkaline Phosphatase: 54 U/L (ref 38–126)
Anion gap: 7 (ref 5–15)
BUN: 22 mg/dL (ref 8–23)
CO2: 26 mmol/L (ref 22–32)
Calcium: 9.1 mg/dL (ref 8.9–10.3)
Chloride: 107 mmol/L (ref 98–111)
Creatinine: 1.35 mg/dL — ABNORMAL HIGH (ref 0.61–1.24)
GFR, Est AFR Am: >60 mL/min (ref >60)
GFR, Estimated: 53 mL/min — ABNORMAL LOW (ref >60)
Glucose, Bld: 112 mg/dL — ABNORMAL HIGH (ref 70–99)
Potassium: 4.2 mmol/L (ref 3.5–5.1)
Sodium: 140 mmol/L (ref 135–145)
Total Bilirubin: 0.4 mg/dL (ref 0.3–1.2)
Total Protein: 7 g/dL (ref 6.5–8.1)

## 2019-04-14 LAB — CBC WITH DIFFERENTIAL (CANCER CENTER ONLY)
Abs Immature Granulocytes: 0.02 10*3/uL (ref 0.00–0.07)
Basophils Absolute: 0 10*3/uL (ref 0.0–0.1)
Basophils Relative: 1 %
Eosinophils Absolute: 0.1 10*3/uL (ref 0.0–0.5)
Eosinophils Relative: 2 %
HCT: 48.5 % (ref 39.0–52.0)
Hemoglobin: 15.7 g/dL (ref 13.0–17.0)
Immature Granulocytes: 0 %
Lymphocytes Relative: 21 %
Lymphs Abs: 1.2 10*3/uL (ref 0.7–4.0)
MCH: 30.9 pg (ref 26.0–34.0)
MCHC: 32.4 g/dL (ref 30.0–36.0)
MCV: 95.5 fL (ref 80.0–100.0)
Monocytes Absolute: 0.8 10*3/uL (ref 0.1–1.0)
Monocytes Relative: 13 %
Neutro Abs: 3.7 10*3/uL (ref 1.7–7.7)
Neutrophils Relative %: 63 %
Platelet Count: 134 10*3/uL — ABNORMAL LOW (ref 150–400)
RBC: 5.08 MIL/uL (ref 4.22–5.81)
RDW: 14.6 % (ref 11.5–15.5)
WBC Count: 5.8 10*3/uL (ref 4.0–10.5)
nRBC: 0 % (ref 0.0–0.2)

## 2019-04-14 MED ORDER — SODIUM CHLORIDE (PF) 0.9 % IJ SOLN
INTRAMUSCULAR | Status: AC
  Filled 2019-04-14: qty 50

## 2019-04-14 MED ORDER — IOHEXOL 300 MG/ML  SOLN
100.0000 mL | Freq: Once | INTRAMUSCULAR | Status: AC | PRN
  Administered 2019-04-14: 100 mL via INTRAVENOUS

## 2019-04-18 ENCOUNTER — Encounter: Payer: Self-pay | Admitting: Internal Medicine

## 2019-04-18 ENCOUNTER — Other Ambulatory Visit: Payer: Self-pay

## 2019-04-18 ENCOUNTER — Inpatient Hospital Stay: Payer: Medicare Other | Admitting: Internal Medicine

## 2019-04-18 ENCOUNTER — Encounter: Payer: Self-pay | Admitting: *Deleted

## 2019-04-18 ENCOUNTER — Other Ambulatory Visit: Payer: Self-pay | Admitting: Internal Medicine

## 2019-04-18 VITALS — BP 134/81 | HR 65 | Temp 98.7°F | Resp 18 | Ht 69.0 in | Wt 233.3 lb

## 2019-04-18 DIAGNOSIS — Z5111 Encounter for antineoplastic chemotherapy: Secondary | ICD-10-CM

## 2019-04-18 DIAGNOSIS — C3491 Malignant neoplasm of unspecified part of right bronchus or lung: Secondary | ICD-10-CM

## 2019-04-18 DIAGNOSIS — C3411 Malignant neoplasm of upper lobe, right bronchus or lung: Secondary | ICD-10-CM | POA: Diagnosis not present

## 2019-04-18 NOTE — Progress Notes (Signed)
James [name]   Fax:([phone]  OFFICE PROGRESS NOTE  James Lima, MD [address] [facility] [address] [place] [address]  DIAGNOSIS: Recurrent non-small cell lung cancer, adenocarcinoma initially diagnosed as stage IA (T1c, N0, M0) non-small cell lung cancer, adenocarcinoma presented with right upper lobe lung nodule in April 2018 with recurrence in June 2019.  Biomarker Findings Microsatellite status - MS-Stable Tumor Mutational Burden - TMB-Low (3 Muts/Mb) Genomic Findings For a complete list of the genes assayed, please refer to the Appendix. EGFR exon 19 deletion ([id number]>B) CDKN2A/B loss NKX2-1 amplification 7 Disease relevant genes with no reportable alterations: KRAS, ALK, BRAF, MET, RET, ERBB2, ROS1   PRIOR THERAPY: Status post right upper lobectomy with lymph node dissection under the care of Dr. [name] on 09/02/2016.  CURRENT THERAPY: Tagrisso 80 mg p.o. daily.  First dose started November 05, 2017.  Status post 18 months of treatment.  INTERVAL HISTORY: James [name] 68 y.o. male returns to the clinic today for follow-up visit.  The patient is feeling fine today with no concerning complaints except for intermittent low back pain with some weakness in the lower extremities.  He gained a lot of weight in the last 6 months.  He denied having any recent chest pain, shortness of breath, cough or hemoptysis.  He denied having any fever or chills.  He has no nausea, vomiting, diarrhea or constipation.  He has no headache or visual changes.  He developed some rash on the left lower extremity and he used Neosporin with improvement of the rash.  The patient has been tolerating his treatment with Tagrisso fairly well.  He had repeat CT scan of the chest, abdomen pelvis performed recently and he is here for evaluation and discussion of his discuss results.   MEDICAL HISTORY: Past Medical History:  Diagnosis Date   Adenocarcinoma of  right lung, stage 1 ([place]) 09/06/2016   Anxiety    Arthritis    "knees, hips, back" (10/19/2012)   Chronic diastolic congestive heart failure (HCC)    Chronic lower back pain    Colon polyps    10/27/2002, repeat letter 09/17/2007   Coronary artery disease    Coronary artery disease involving native coronary artery of native heart without angina pectoris    Depressive disorder, not elsewhere classified    no meds   Diabetes mellitus without complication ([place])    diet controlled- no med  (while in hosp 4/18 -elevated cbg   Dyspnea    Fasting hyperglycemia    GERD (gastroesophageal reflux disease)    Heart murmur    Hemoptysis    abnormal CT Chest 01/29/10 - ? new GG changes RUL > not viz on plain cxr 02/26/2010   Hypertension    Mitral regurgitation    severe MR 08/2016   MPN (myeloproliferative neoplasm) ([place])    1st detected 06/04/1998   Obesity    OSA on CPAP    last sleep study 10 years ago   Other and unspecified hyperlipidemia    Peripheral vascular disease ([place]) 08/2016   after lung surgey small clots in lungs,after hip dvt-5/16   Pneumonia    4/18   Positive PPD 1965   "non reactive in 2012" (10/19/2012)   Routine general medical examination at a health care facility    S/P CABG x 1 03/24/2017   [name] to LAD   S/P mitral valve repair 03/24/2017   Complex valvuloplasty including artificial Gore-tex neochord  placement x6 and 28 mm Sorin Annuloflex posterior annuloplasty band   Special screening for malignant neoplasm of prostate    Spinal stenosis, unspecified region other than cervical    Wrist pain, left     ALLERGIES:  is allergic to symbicort [budesonide-formoterol fumarate] and amoxicillin.  MEDICATIONS:  Current Outpatient Medications  Medication Sig Dispense Refill   ezetimibe (ZETIA) 10 MG tablet Take 1 tablet (10 mg total) by mouth daily. 90 tablet 3   furosemide (LASIX) 20 MG tablet Take 1/2 tablet 10 mg daily 30 tablet 6    metoprolol tartrate (LOPRESSOR) 25 MG tablet Take 0.5 tablets (12.5 mg total) by mouth 2 (two) times daily. 90 tablet 3   Omega-3 Fatty Acids (FISH OIL) 500 MG CAPS Take 500 mg by mouth daily.      rivaroxaban (XARELTO) 20 MG TABS tablet Take 1 tablet (20 mg total) by mouth daily with supper. 90 tablet 3   sildenafil (REVATIO) 20 MG tablet Take 4 tablets (80 mg total) by mouth daily as needed. 60 tablet 5   TAGRISSO 80 MG tablet TAKE 1 TABLET (80 MG TOTAL) BY MOUTH DAILY. 30 tablet 2   No current facility-administered medications for this visit.     SURGICAL HISTORY:  Past Surgical History:  Procedure Laterality Date   ANTERIOR CRUCIATE LIGAMENT REPAIR Left 1967   CARDIAC CATHETERIZATION  2000   CHEST TUBE INSERTION Right 10/19/2012   post bronch   COLONOSCOPY W/ POLYPECTOMY     CORONARY ARTERY BYPASS GRAFT N/A 03/24/2017   Procedure: CORONARY ARTERY BYPASS GRAFTING (CABG)x1 using left internal mammary artery, [name]-LAD;  Surgeon: Rexene Alberts, MD;  Location: [facility];  Service: Open Heart Surgery;  Laterality: N/A;   FLEXIBLE BRONCHOSCOPY  10/19/2012   Flexible video fiberoptic bronchoscopy with electromagnetic navigation and biopsies.   INTRAVASCULAR PRESSURE WIRE/FFR STUDY N/A 08/11/2016   Procedure: Intravascular Pressure Wire/FFR Study;  Surgeon: [name], MD;  Location: [facility];  Service: Cardiovascular;  Laterality: N/A;   IR ANGIOGRAM PULMONARY BILATERAL SELECTIVE  02/06/2018   IR ANGIOGRAM SELECTIVE EACH ADDITIONAL VESSEL  02/06/2018   IR ANGIOGRAM SELECTIVE EACH ADDITIONAL VESSEL  02/06/2018   IR INFUSION THROMBOL ARTERIAL INITIAL (MS)  02/06/2018   IR INFUSION THROMBOL ARTERIAL INITIAL (MS)  02/06/2018   IR THROMB F/U EVAL ART/VEN FINAL DAY (MS)  02/07/2018   IR US GUIDE VASC ACCESS RIGHT  02/06/2018   LOBECTOMY Right 09/02/2016   Procedure: RIGHT UPPER LOBECTOMY;  Surgeon: Melrose Nakayama, MD;  Location: [facility];  Service: Thoracic;  Laterality:  Right;   LYMPH NODE DISSECTION Right 09/02/2016   Procedure: LYMPH NODE DISSECTION, RIGHT LUNG;  Surgeon: Melrose Nakayama, MD;  Location: [facility];  Service: Thoracic;  Laterality: Right;   MITRAL VALVE REPAIR N/A 03/24/2017   Procedure: MITRAL VALVE REPAIR (MVR) with Sorin Carbomedics Annuloflex size 28;  Surgeon: Rexene Alberts, MD;  Location: [facility];  Service: Open Heart Surgery;  Laterality: N/A;   RIGHT/LEFT HEART CATH AND CORONARY ANGIOGRAPHY N/A 08/11/2016   Procedure: Right/Left Heart Cath and Coronary Angiography;  Surgeon: [name], MD;  Location: [facility];  Service: Cardiovascular;  Laterality: N/A;   TEE WITHOUT CARDIOVERSION N/A 07/17/2016   Procedure: TRANSESOPHAGEAL ECHOCARDIOGRAM (TEE);  Surgeon: [name], MD;  Location: [facility] ENDOSCOPY;  Service: Cardiovascular;  Laterality: N/A;   TEE WITHOUT CARDIOVERSION N/A 03/24/2017   Procedure: TRANSESOPHAGEAL ECHOCARDIOGRAM (TEE);  Surgeon: Rexene Alberts, MD;  Location: [facility]  OR;  Service: Open Heart Surgery;  Laterality: N/A;   TONSILLECTOMY  1950's   TOTAL HIP ARTHROPLASTY Left 10/05/2014   dr [name]   TOTAL HIP ARTHROPLASTY Left 10/05/2014   Procedure: LEFT TOTAL HIP ARTHROPLASTY ANTERIOR APPROACH;  Surgeon: [name], MD;  Location: [facility];  Service: Orthopedics;  Laterality: Left;   VIDEO ASSISTED THORACOSCOPY (VATS)/WEDGE RESECTION Right 09/02/2016   Procedure: VIDEO ASSISTED THORACOSCOPY (VATS)/RIGHT UPPER LOBE WEDGE RESECTION;  Surgeon: Melrose Nakayama, MD;  Location: [facility];  Service: Thoracic;  Laterality: Right;   VIDEO BRONCHOSCOPY WITH ENDOBRONCHIAL NAVIGATION N/A 10/19/2012   Procedure: VIDEO BRONCHOSCOPY WITH ENDOBRONCHIAL NAVIGATION;  Surgeon: [name], MD;  Location: [facility];  Service: Thoracic;  Laterality: N/A;   WRIST RECONSTRUCTION Left 12/2009   'proximal row carpectomy" [name]    REVIEW OF SYSTEMS:  Constitutional: positive for fatigue Eyes: negative Ears, nose, mouth,  throat, and face: negative Respiratory: negative Cardiovascular: negative Gastrointestinal: negative Genitourinary:negative Integument/breast: positive for rash Hematologic/lymphatic: negative Musculoskeletal:positive for back pain Neurological: negative Behavioral/Psych: negative Endocrine: negative Allergic/Immunologic: negative   PHYSICAL EXAMINATION: General appearance: alert, cooperative and no distress Head: Normocephalic, without obvious abnormality, atraumatic Neck: no adenopathy, no JVD, supple, symmetrical, trachea midline and thyroid not enlarged, symmetric, no tenderness/mass/nodules Lymph nodes: Cervical, supraclavicular, and axillary nodes normal. Resp: clear to auscultation bilaterally Back: symmetric, no curvature. ROM normal. No CVA tenderness. Cardio: regular rate and rhythm, S1, S2 normal, no murmur, click, rub or gallop GI: soft, non-tender; bowel sounds normal; no masses,  no organomegaly Extremities: extremities normal, atraumatic, no cyanosis or edema Neurologic: Alert and oriented X 3, normal strength and tone. Normal symmetric reflexes. Normal coordination and gait  ECOG PERFORMANCE STATUS: 1 - Symptomatic but completely ambulatory  Blood pressure 134/81, pulse 65, temperature 98.7 F (37.1 C), temperature source Oral, resp. rate 18, height 5' 9"  (1.753 m), weight 233 lb 4.8 oz (105.8 kg), SpO2 97 %.  LABORATORY DATA: Lab Results  Component Value Date   WBC 5.8 04/14/2019   HGB 15.7 04/14/2019   HCT 48.5 04/14/2019   MCV 95.5 04/14/2019   PLT 134 (L) 04/14/2019      Chemistry      Component Value Date/Time   NA 140 04/14/2019 1030   NA 142 10/01/2016 1534   K 4.2 04/14/2019 1030   K 4.2 10/01/2016 1534   CL 107 04/14/2019 1030   CO2 26 04/14/2019 1030   CO2 28 10/01/2016 1534   BUN 22 04/14/2019 1030   BUN 15.9 10/01/2016 1534   CREATININE 1.35 (H) 04/14/2019 1030   CREATININE 1.10 02/08/2017 1101   CREATININE 1.5 (H) 10/01/2016 1534        Component Value Date/Time   CALCIUM 9.1 04/14/2019 1030   CALCIUM 9.6 10/01/2016 1534   ALKPHOS 54 04/14/2019 1030   ALKPHOS 65 10/01/2016 1534   AST 37 04/14/2019 1030   AST 20 10/01/2016 1534   ALT 40 04/14/2019 1030   ALT 24 10/01/2016 1534   BILITOT 0.4 04/14/2019 1030   BILITOT 0.46 10/01/2016 1534       RADIOGRAPHIC STUDIES: Ct Chest W Contrast  Result Date: 04/14/2019 CLINICAL DATA:  Non-small-cell lung cancer diagnosed April 2018 with recurrence in May 2019. Previous right lobectomy. Oral chemotherapy ongoing. EXAM: CT CHEST, ABDOMEN, AND PELVIS WITH CONTRAST TECHNIQUE: Multidetector CT imaging of the chest, abdomen and pelvis was performed following the standard protocol during bolus administration of intravenous contrast. CONTRAST:  185m OMNIPAQUE IOHEXOL 300 MG/ML  SOLN COMPARISON:  CT 01/20/2019. FINDINGS: CT CHEST FINDINGS Cardiovascular: Mild atherosclerosis of the aorta, great vessels and coronary arteries post median sternotomy. Stable chronic attenuation of the left lower lobe pulmonary artery. No acute vascular findings. There are aortic valvular calcifications. The heart size is normal. There is no pericardial effusion. Mediastinum/Nodes: There are no enlarged mediastinal, hilar or axillary lymph nodes. The thyroid gland, trachea and esophagus demonstrate no significant findings. Lungs/Pleura: There is a stable small loculated right pleural effusion status post right upper lobe resection. 3 mm right lower lobe nodule on image 73/4 is stable. Stable partially calcified subpleural scarring in the left lower lobe measuring 1.8 cm on image 134/4. Stable clustered nodularity in the left upper and lower lobes. No suspicious pulmonary nodules. Musculoskeletal/Chest wall: No chest wall mass or suspicious osseous findings. CT ABDOMEN AND PELVIS FINDINGS Hepatobiliary: Stable hepatic cysts. No new or enlarging hepatic lesions. No evidence of gallstones, gallbladder wall thickening or  biliary dilatation. Pancreas: Unremarkable. No pancreatic ductal dilatation or surrounding inflammatory changes. Spleen: Normal in size without focal abnormality. Adrenals/Urinary Tract: Both adrenal glands appear normal. Stable parapelvic and cortical cysts in the lower pole of the left kidney. No evidence of renal mass or hydronephrosis. There is no urinary tract calculus. 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 Mild bladder wall thickening appears similar to the previous study allowing for lesser bladder distension currently. Stomach/Bowel: No evidence of bowel wall thickening, distention or surrounding inflammatory change. The appendix appears normal. Vascular/Lymphatic: There are no enlarged abdominal or pelvic lymph nodes. Mild aortic and branch vessel atherosclerosis. Reproductive: Stable mild enlargement of the prostate gland with central dystrophic calcifications. Other: No evidence of abdominal wall mass or hernia. No ascites. Musculoskeletal: No acute or significant osseous findings. There is lumbar spondylosis with probable chronic spinal stenosis at L2-3. Left total hip arthroplasty noted. IMPRESSION: 1. Stable CTS of the chest, abdomen and pelvis. No evidence of local recurrence or metastatic disease. 2. Stable small loculated right pleural effusion status post right upper lobe resection. 3. Aortic Atherosclerosis (ICD10-I70.0). Electronically Signed   By: Richardean Sale M.D.   On: 04/14/2019 13:56   Ct Abdomen Pelvis W Contrast  Result Date: 04/14/2019 CLINICAL DATA:  Non-small-cell lung cancer diagnosed April 2018 with recurrence in May 2019. Previous right lobectomy. Oral chemotherapy ongoing. EXAM: CT CHEST, ABDOMEN, AND PELVIS WITH CONTRAST TECHNIQUE: Multidetector CT imaging of the chest, abdomen and pelvis was performed following the standard protocol during bolus administration of intravenous contrast. CONTRAST:  169m OMNIPAQUE IOHEXOL 300 MG/ML  SOLN COMPARISON:  CT 01/20/2019. FINDINGS: CT CHEST  FINDINGS Cardiovascular: Mild atherosclerosis of the aorta, great vessels and coronary arteries post median sternotomy. Stable chronic attenuation of the left lower lobe pulmonary artery. No acute vascular findings. There are aortic valvular calcifications. The heart size is normal. There is no pericardial effusion. Mediastinum/Nodes: There are no enlarged mediastinal, hilar or axillary lymph nodes. The thyroid gland, trachea and esophagus demonstrate no significant findings. Lungs/Pleura: There is a stable small loculated right pleural effusion status post right upper lobe resection. 3 mm right lower lobe nodule on image 73/4 is stable. Stable partially calcified subpleural scarring in the left lower lobe measuring 1.8 cm on image 134/4. Stable clustered nodularity in the left upper and lower lobes. No suspicious pulmonary nodules. Musculoskeletal/Chest wall: No chest wall mass or suspicious osseous findings. CT ABDOMEN AND PELVIS FINDINGS Hepatobiliary: Stable hepatic cysts. No new or enlarging hepatic lesions. No evidence of gallstones, gallbladder wall thickening or biliary dilatation. Pancreas: Unremarkable. No pancreatic  ductal dilatation or surrounding inflammatory changes. Spleen: Normal in size without focal abnormality. Adrenals/Urinary Tract: Both adrenal glands appear normal. Stable parapelvic and cortical cysts in the lower pole of the left kidney. No evidence of renal mass or hydronephrosis. There is no urinary tract calculus. 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 Mild bladder wall thickening appears similar to the previous study allowing for lesser bladder distension currently. Stomach/Bowel: No evidence of bowel wall thickening, distention or surrounding inflammatory change. The appendix appears normal. Vascular/Lymphatic: There are no enlarged abdominal or pelvic lymph nodes. Mild aortic and branch vessel atherosclerosis. Reproductive: Stable mild enlargement of the prostate gland with central dystrophic  calcifications. Other: No evidence of abdominal wall mass or hernia. No ascites. Musculoskeletal: No acute or significant osseous findings. There is lumbar spondylosis with probable chronic spinal stenosis at L2-3. Left total hip arthroplasty noted. IMPRESSION: 1. Stable CTS of the chest, abdomen and pelvis. No evidence of local recurrence or metastatic disease. 2. Stable small loculated right pleural effusion status post right upper lobe resection. 3. Aortic Atherosclerosis (ICD10-I70.0). Electronically Signed   By: Richardean Sale M.D.   On: 04/14/2019 13:56    ASSESSMENT AND PLAN: This is a very pleasant 68 years old white male with a stage Ia non-small cell lung cancer status post right upper lobectomy with lymph node dissection on September 02, 2016. The patient he was on observation since that time. Unfortunately there are some concerning findings with nodular density in the posterior right lower lobe as well as increase and right pleural effusion suspicious for disease recurrence. He underwent ultrasound-guided right thoracentesis and the cytology of the pleural fluid was consistent with recurrent adenocarcinoma. He had molecular studies performed by [facility] 1 that showed positive EGFR mutation with deletion 18. The patient was started on treatment with Tagrisso 80 mg p.o. daily on 11/05/2017.  He status post 18 months of treatment. The patient continues to tolerate this treatment well with no concerning adverse effects. He had repeat CT scan of the chest, abdomen pelvis performed recently.  I personally and independently reviewed the scans and discussed the results with the patient today. His scan showed no concerning findings for disease progression. I recommended for the patient to continue his current treatment with Tagrisso with the same dose. For the skin rash, he was advised to apply hydrocortisone cream if needed. I will see the patient back for follow-up visit in 2 months for evaluation  with repeat blood work. He was advised to call immediately if he has any concerning symptoms in the interval. The patient voices understanding of current disease status and treatment options and is in agreement with the current care plan. All questions were answered. The patient knows to call the clinic with any problems, questions or concerns. We can certainly see the patient much sooner if necessary.  Disclaimer: This note was dictated with voice recognition software. Similar sounding words can inadvertently be transcribed and may not be corrected upon review.

## 2019-04-18 NOTE — Progress Notes (Signed)
Oncology Nurse Navigator Documentation  Oncology Nurse Navigator Flowsheets 04/18/2019  Abnormal Finding Date -  Confirmed Diagnosis Date -  Phase of Treatment -  Expected Surgery Date -  Navigator Follow Up Date: -  Navigator Follow Up Reason: -  Post Navigation: Continue to Follow Patient? -  Navigator Location [facility]  Referral Date to RadOnc/MedOnc -  Navigator Encounter Type Clinic/MDC/I spoke with patient today during his visit with Dr. Julien Nordmann.  He stated he was concerned about his insurance and covering oral biologic.  He states the state completes his insurance sign up.  I asked that he keep the same coverage and if he is having trouble with medication coverage to let us know.    Telephone -  [facility] Date -  Multidisiplinary Clinic Type -  Treatment Initiated Date -  Patient Visit Type MedOnc  Treatment Phase Treatment  Barriers/Navigation Needs Education  Education Other  Interventions Education  Acuity Level 2-Minimal Needs (1-2 Barriers Identified)  Coordination of Care -  Education Method Verbal  Support Groups/Services -  Time Spent with Patient 15

## 2019-04-19 ENCOUNTER — Telehealth: Payer: Self-pay | Admitting: Internal Medicine

## 2019-04-19 NOTE — Telephone Encounter (Signed)
Scheduled per los. Called and spoke with patient. Confirmed appt 

## 2019-04-21 MED FILL — TAGRISSO 80 MG TABLET: 80 | 30 days supply | Qty: 30 | Fill #0

## 2019-04-22 LAB — HEPATIC FUNCTION PANEL
ALT: 32 IU/L (ref 0–44)
AST: 30 IU/L (ref 0–40)
Albumin: 4.1 g/dL (ref 3.8–4.8)
Alkaline Phosphatase: 58 IU/L (ref 39–117)
Bilirubin Total: 0.4 mg/dL (ref 0.0–1.2)
Bilirubin, Direct: 0.12 mg/dL (ref 0.00–0.40)
Total Protein: 6 g/dL (ref 6.0–8.5)

## 2019-04-22 LAB — LIPID PANEL
Chol/HDL Ratio: 4.3 ratio (ref 0.0–5.0)
Cholesterol, Total: 212 mg/dL — ABNORMAL HIGH (ref 100–199)
HDL: 49 mg/dL (ref >39)
LDL Chol Calc (NIH): 141 mg/dL — ABNORMAL HIGH (ref 0–99)
Triglycerides: 125 mg/dL (ref 0–149)
VLDL Cholesterol Cal: 22 mg/dL (ref 5–40)

## 2019-04-25 NOTE — Progress Notes (Signed)
Cardiology Office Note    Date:  04/27/2019   ID:  James [name], DOB 05-24-1950, MRN [id number]  PCP:  [name], MD  Cardiologist:  [name]   Chief Complaint  Patient presents with  . Shortness of Breath    History of Present Illness:  James [name] is a 68 y.o. male with PMH of DM II, HLD, and severe MR. he was originally noted to have a loud murmur by his primary care physician.  Initial echocardiogram suggested mild MR, however it also suggested partially flail leaflet.  MR was thought to be underestimated.  TEE showed a flail P3 segment with ruptured cord and a severe MR.  Cardiac catheterization performed on 08/11/2016 showed 65% mid to distal LAD lesion, FFR borderline at 0.81, EF 65%.  Normal cardiac output and RV/LV filling pressure.  He had a PET scan on 08/17/2016 that showed a hypermetabolic solid 2.6 cm anterior right upper lobe pulmonary nodule compatible with prior bronchogenic, no hypermetabolic thoracic lymphadenopathy or distal metastatic disease.  There were nonspecific and mild asymmetric hypermetabolism in the left pontine tonsil and the left tongue base region.  CT surgery recommended lobectomy for his lung mass with possible CABG with LIMA to LAD and MV repair.  He underwent right upper lobe lobectomy in April.  Pathology positive for adenocarcinoma stage Ia.  Serial CT scan was recommended by oncology.  Post procedure, he became very short of breath.  CTA was positive for a small PE, he was started on Xarelto.  Repeat echocardiogram obtained on 02/15/2017 showed EF 60-65%, persistent MR.  He eventually underwent mitral valve repair with Sorin Carbomedics Annuloglex size 28, LIMA to LAD by Dr. Roxy Manns. He was DC on coumadin instead of Xarelto. Completed 6 months of anticoagulation for provoked PE and then it was stopped.   When last seen in April he was progressing well. More recently in August CT showed a right pleural effusion. Thoracentesis was positive for recurrent  CA. He was started on Tagrisso by Dr. Inda Merlin. He was admitted on February 06, 2018 with submassive PE. He was treated with  EKOS with bilateral intra-arterial TPA, patient completed 6 days of IV heparin.The original plan was to transition to Xarelto after 5 days of IV heparin. However he developed left flank pain, hematuria and right leg painful swelling. Hence IV heparin was continued. Both Urology andOIrthopedics evaluated and cleared for transitioning to [place].  Xarelto was started on 02/12/2018.  Hematuria decreased  Right leg pain also improved. MRI did show evidence of hematoma that was improving. He had chronic DVT in right peroneal vein.  Since patient was started on Xarelto, aspirin discontinued to minimize bleeding risk. He had follow up CT on Dec 4 showing no evidence of progression of CA.   He states his breathing is doing better since the humidity has dropped he has seen pulmonary and they recommended increased activity and weight loss. He is working on this. He does note pain in his hip and muscle cramps in his back. No bleeding. No chest pain.   Past Medical History:  Diagnosis Date  . Adenocarcinoma of right lung, stage 1 ([place]) 09/06/2016  . Anxiety   . Arthritis    "knees, hips, back" (10/19/2012)  . Chronic diastolic congestive heart failure ([place])   . Chronic lower back pain   . Colon polyps    10/27/2002, repeat letter 09/17/2007  . Coronary artery disease   . Coronary artery disease involving native coronary artery  of native heart without angina pectoris   . Depressive disorder, not elsewhere classified    no meds  . Diabetes mellitus without complication ([place])    diet controlled- no med  (while in hosp 4/18 -elevated cbg  . Dyspnea   . Fasting hyperglycemia   . GERD (gastroesophageal reflux disease)   . Heart murmur   . Hemoptysis    abnormal CT Chest 01/29/10 - ? new GG changes RUL > not viz on plain cxr 02/26/2010  . Hypertension   . Mitral regurgitation     severe MR 08/2016  . MPN (myeloproliferative neoplasm) ([place])    1st detected 06/04/1998  . Obesity   . OSA on CPAP    last sleep study 10 years ago  . Other and unspecified hyperlipidemia   . Peripheral vascular disease ([place]) 08/2016   after lung surgey small clots in lungs,after hip dvt-5/16  . Pneumonia    4/18  . Positive PPD 1965   "non reactive in 2012" (10/19/2012)  . Routine general medical examination at a health care facility   . S/P CABG x 1 03/24/2017   LIMA to LAD  . S/P mitral valve repair 03/24/2017   Complex valvuloplasty including artificial Gore-tex neochord placement x6 and 28 mm Sorin Annuloflex posterior annuloplasty band  . Special screening for malignant neoplasm of prostate   . Spinal stenosis, unspecified region other than cervical   . Wrist pain, left     Past Surgical History:  Procedure Laterality Date  . ANTERIOR CRUCIATE LIGAMENT REPAIR Left 1967  . CARDIAC CATHETERIZATION  2000  . CHEST TUBE INSERTION Right 10/19/2012   post bronch  . COLONOSCOPY W/ POLYPECTOMY    . CORONARY ARTERY BYPASS GRAFT N/A 03/24/2017   Procedure: CORONARY ARTERY BYPASS GRAFTING (CABG)x1 using left internal mammary artery, LIMA-LAD;  Surgeon: Rexene Alberts, MD;  Location: [facility];  Service: Open Heart Surgery;  Laterality: N/A;  . FLEXIBLE BRONCHOSCOPY  10/19/2012   Flexible video fiberoptic bronchoscopy with electromagnetic navigation and biopsies.  . INTRAVASCULAR PRESSURE WIRE/FFR STUDY N/A 08/11/2016   Procedure: Intravascular Pressure Wire/FFR Study;  Surgeon: Anelly Samarin M Martinique, MD;  Location: [facility];  Service: Cardiovascular;  Laterality: N/A;  . IR ANGIOGRAM PULMONARY BILATERAL SELECTIVE  02/06/2018  . IR ANGIOGRAM SELECTIVE EACH ADDITIONAL VESSEL  02/06/2018  . IR ANGIOGRAM SELECTIVE EACH ADDITIONAL VESSEL  02/06/2018  . IR INFUSION THROMBOL ARTERIAL INITIAL (MS)  02/06/2018  . IR INFUSION THROMBOL ARTERIAL INITIAL (MS)  02/06/2018  . IR THROMB F/U EVAL ART/VEN  FINAL DAY (MS)  02/07/2018  . IR US GUIDE VASC ACCESS RIGHT  02/06/2018  . LOBECTOMY Right 09/02/2016   Procedure: RIGHT UPPER LOBECTOMY;  Surgeon: Melrose Nakayama, MD;  Location: [facility];  Service: Thoracic;  Laterality: Right;  . LYMPH NODE DISSECTION Right 09/02/2016   Procedure: LYMPH NODE DISSECTION, RIGHT LUNG;  Surgeon: Melrose Nakayama, MD;  Location: [facility];  Service: Thoracic;  Laterality: Right;  . MITRAL VALVE REPAIR N/A 03/24/2017   Procedure: MITRAL VALVE REPAIR (MVR) with Sorin Carbomedics Annuloflex size 28;  Surgeon: Rexene Alberts, MD;  Location: [facility];  Service: Open Heart Surgery;  Laterality: N/A;  . RIGHT/LEFT HEART CATH AND CORONARY ANGIOGRAPHY N/A 08/11/2016   Procedure: Right/Left Heart Cath and Coronary Angiography;  Surgeon: Kamaljit Hizer M Martinique, MD;  Location: [facility];  Service: Cardiovascular;  Laterality: N/A;  . TEE WITHOUT CARDIOVERSION N/A 07/17/2016   Procedure: TRANSESOPHAGEAL ECHOCARDIOGRAM (TEE);  Surgeon:  [name], MD;  Location: [facility] END[facility];  Service: Cardiovascular;  Laterality: N/A;  . TEE WITHOUT CARDIOVERSION N/A 03/24/2017   Procedure: TRANSESOPHAGEAL ECHOCARDIOGRAM (TEE);  Surgeon: Rexene Alberts, MD;  Location: [facility];  Service: Open Heart Surgery;  Laterality: N/A;  . TONSILLECTOMY  1950's  . TOTAL HIP ARTHROPLASTY Left 10/05/2014   dr [name]  . TOTAL HIP ARTHROPLASTY Left 10/05/2014   Procedure: LEFT TOTAL HIP ARTHROPLASTY ANTERIOR APPROACH;  Surgeon: [name], MD;  Location: [facility];  Service: Orthopedics;  Laterality: Left;  Marland Kitchen VIDEO ASSISTED THORACOSCOPY (VATS)/WEDGE RESECTION Right 09/02/2016   Procedure: VIDEO ASSISTED THORACOSCOPY (VATS)/RIGHT UPPER LOBE WEDGE RESECTION;  Surgeon: Melrose Nakayama, MD;  Location: [facility];  Service: Thoracic;  Laterality: Right;  Marland Kitchen VIDEO BRONCHOSCOPY WITH ENDOBRONCHIAL NAVIGATION N/A 10/19/2012   Procedure: VIDEO BRONCHOSCOPY WITH ENDOBRONCHIAL NAVIGATION;  Surgeon: [name], MD;   Location: [facility];  Service: Thoracic;  Laterality: N/A;  . WRIST RECONSTRUCTION Left 12/2009   'proximal row carpectomy" [name]    Current Medications: Outpatient Medications Prior to Visit  Medication Sig Dispense Refill  . ezetimibe (ZETIA) 10 MG tablet Take 1 tablet (10 mg total) by mouth daily. 90 tablet 3  . furosemide (LASIX) 20 MG tablet Take 1/2 tablet 10 mg daily 30 tablet 6  . metoprolol tartrate (LOPRESSOR) 25 MG tablet Take 0.5 tablets (12.5 mg total) by mouth 2 (two) times daily. 90 tablet 3  . Omega-3 Fatty Acids (FISH OIL) 500 MG CAPS Take 500 mg by mouth daily.     . rivaroxaban (XARELTO) 20 MG TABS tablet Take 1 tablet (20 mg total) by mouth daily with supper. 90 tablet 3  . sildenafil (REVATIO) 20 MG tablet Take 4 tablets (80 mg total) by mouth daily as needed. 60 tablet 5  . TAGRISSO 80 MG tablet TAKE 1 TABLET (80 MG TOTAL) BY MOUTH DAILY. 30 tablet 2   No facility-administered medications prior to visit.     Allergies:   Symbicort [budesonide-formoterol fumarate] and Amoxicillin   Social History   Socioeconomic History  . Marital status: Married    Spouse name: Not on file  . Number of children: 3  . Years of education: Not on file  . Highest education level: Not on file  Occupational History  . Occupation: Retired, Financial risk analyst  . Occupation: Retired, Real estate    Comment: Slow  Tobacco Use  . Smoking status: Never Smoker  . Smokeless tobacco: Never Used  Substance and Sexual Activity  . Alcohol use: Yes    Alcohol/week: 1.0 standard drinks    Types: 1 Cans of beer per week    Comment: none since 3 weeks  . Drug use: No  . Sexual activity: Never    Partners: Female  Other Topics Concern  . Not on file  Social History Narrative   HSG, [employer]. Married '73.  2 sons - '74,   '76;  1 daughter -  '77  6 grandchildren.   Work - IT consultant now retired. ACP - not fully discussed.                   Social  Determinants of Health   Financial Resource Strain:   . Difficulty of Paying Living Expenses: Not on file  Food Insecurity:   . Worried About Charity fundraiser in the Last Year: Not on file  . Ran Out of Food in the Last Year: Not on file  Transportation Needs:   . Film/video editor (Medical): Not on file  . Lack of Transportation (Non-Medical): Not on file  Physical Activity:   . Days of Exercise per Week: Not on file  . Minutes of Exercise per Session: Not on file  Stress:   . Feeling of Stress : Not on file  Social Connections:   . Frequency of Communication with Friends and Family: Not on file  . Frequency of Social Gatherings with Friends and Family: Not on file  . Attends Religious Services: Not on file  . Active Member of Clubs or Organizations: Not on file  . Attends Archivist Meetings: Not on file  . Marital Status: Not on file     Family History:  The patient's family history includes Heart attack in his father; Heart disease in his father and paternal uncle; Heart failure in his mother.   ROS:   Please see the history of present illness.    ROS All other systems reviewed and are negative.   PHYSICAL EXAM:   VS:  BP (!) 142/81   Pulse 65   Ht 5\' 9"  (1.753 m)   Wt 233 lb (105.7 kg)   SpO2 98%   BMI 34.41 kg/m    GENERAL:  Well appearing WM in NAD HEENT:  PERRL, EOMI, sclera are clear. Oropharynx is clear. NECK:  No jugular venous distention, carotid upstroke brisk and symmetric, no bruits, no thyromegaly or adenopathy LUNGS:  Clear to auscultation bilaterally CHEST:  Unremarkable HEART:  RRR,  PMI not displaced or sustained,S1 and S2 within normal limits, no S3, no S4: no clicks, no rubs, gr 2/6 systolic murmur LSB ABD:  Soft, nontender. BS +, no masses or bruits. No hepatomegaly, no splenomegaly EXT:  2 + pulses throughout, 2+  edema, no cyanosis no clubbing SKIN:  Warm and dry.  No rashes NEURO:  Alert and oriented x 3. Cranial nerves II  through XII intact. PSYCH:  Cognitively intact      Wt Readings from Last 3 Encounters:  04/27/19 233 lb (105.7 kg)  04/18/19 233 lb 4.8 oz (105.8 kg)  03/06/19 231 lb 14.4 oz (105.2 kg)      Studies/Labs Reviewed:   EKG:  EKG is not ordered today.   Recent Labs: 12/13/2018: Pro B Natriuretic peptide (BNP) 122.0; TSH 2.10 04/14/2019: BUN 22; Creatinine 1.35; Hemoglobin 15.7; Platelet Count 134; Potassium 4.2; Sodium 140 04/21/2019: ALT 32   Lipid Panel    Component Value Date/Time   CHOL 212 (H) 04/21/2019 0849   TRIG 125 04/21/2019 0849   TRIG 108 05/20/2006 0825   HDL 49 04/21/2019 0849   CHOLHDL 4.3 04/21/2019 0849   CHOLHDL 5 12/13/2018 1101   VLDL 26.0 12/13/2018 1101   LDLCALC 141 (H) 04/21/2019 0849   LDLDIRECT 177.0 07/24/2015 1052    Additional studies/ records that were reviewed today include:    Cath 08/11/2016 Conclusion     Prox LAD to Mid LAD lesion, 30 %stenosed.  Mid LAD to Dist LAD lesion, 65 %stenosed.  Prox RCA to Mid RCA lesion, 15 %stenosed.  The left ventricular systolic function is normal.  LV end diastolic pressure is normal.  The left ventricular ejection fraction is 55-65% by visual estimate.  LV end diastolic pressure is normal.   1. Borderline single vessel obstructive CAD involving the mid LAD. FFR 0.81 2. Normal LV function EF 65% 3. Normal right heart and LV filling pressures 4. Normal Cardiac output  Plan: will refer  to CT surgery for consideration of MV repair. The stenosis in the LAD will be discussed. He is asymptomatic and I would favor treating it medically. If he develops symptoms in the future it could be treated with PCI.        Echo 02/15/2017 LV EF: 60% -   65%  Study Conclusions  - Left ventricle: The cavity size was normal. Wall thickness was   normal. Systolic function was normal. The estimated ejection   fraction was in the range of 60% to 65%. - Aortic valve: AV is thickened, calcified with  minimally   restricted motion. - Mitral valve: Prolapse fo the posterior mitral leaflet. MR is at   least moderate in intensity Calcified annulus. Mildly thickened   leaflets . - Left atrium: The atrium was mildly dilated.    CABG with MVR 03/24/2017 Preoperative Diagnosis:        Severe Mitral Regurgitation  Single-vessel Coronary Artery Disease  Postoperative Diagnosis:    Same  Procedure:        Mitral Valve Repair             Complex valvuloplasty including artificial Gore-tex neochord placement x6             Sorin Carbomedics Annuloflex posterior annuloplasty band (size 67mm, catalog #[id number], serial #[id number])   Coronary Artery Bypass Grafting x 1              Left Internal Mammary Artery to Distal Left Anterior Descending Coronary Artery   Echo 06/10/17: Study Conclusions  - Left ventricle: The cavity size was normal. There was moderate   concentric hypertrophy. Systolic function was vigorous. The   estimated ejection fraction was in the range of 65% to 70%. Wall   motion was normal; there were no regional wall motion   abnormalities. Doppler parameters are consistent with abnormal   left ventricular relaxation (grade 1 diastolic dysfunction). - Aortic valve: Trileaflet; mildly thickened, mildly calcified   leaflets. Transvalvular velocity was minimally increased. There   was no stenosis. There was no regurgitation. - Mitral valve: S/P mitral valve repair with fixed posterior   leaflet. Transvalvular velocity was elevated. There was no   regurgitation. - Right ventricle: The cavity size was normal. Wall thickness was   normal. Systolic function was normal. - Tricuspid valve: There was no regurgitation. - Pericardium, extracardiac: A mild pericardial effusion was   identified posterior to the heart. Features were not consistent   with tamponade physiology.  Impressions:  - Since the last study on 02/15/2017 mitral valve is post repair.   Transmitral  velocities are elevated with mean gradient 8 mmHg.   LVEF is hyperdynamic.  Echo 02/07/18: Study Conclusions  - Left ventricle: The cavity size was mildly reduced. Wall   thickness was increased in a pattern of mild LVH. Systolic   function was vigorous. The estimated ejection fraction was in the   range of 65% to 70%. - Mitral valve: s/p MV repair. Motion is difficult to see due to   poor acoustic windows Peak and mean gradiaeants through the valve   are 7 and 4 mm Hg respectively. MVA by P T1/2 is 1.75 cm2   consistent with mild MS - Right ventricle: RV is difficult to visualize, even with Definity   OVerall RVEF appears mildly reduced. The cavity size was mildly   dilated. - Right atrium: The atrium was mildly dilated.  ASSESSMENT:    1. S/P mitral valve repair   2. Coronary  artery disease involving coronary bypass graft of native heart without angina pectoris   3. History of pulmonary embolism   4. Hyperlipidemia with target LDL less than 130   5. Stage 3a chronic kidney disease   6. S/P CABG x 1   7. Chronic diastolic heart failure (HCC)      PLAN:  In order of problems listed above:  1. Severe MR s/p mitral valve annuloplasty/repair:  F/U Echo showed an excellent repair.  Recommend routine SBE prophylaxis.    2.   History of PE now recurrent on 02/06/18. Submassive with RV strain. S/p EKOS and lytic therapy. On Xarelto now.  Will need anticoagulation indefinitely.   3.    Chronic diastolic heart failure: weight is stable.  He does have chronic  Edema more related to venous insuffieciency. Continue low dose diuretic.  Continue sodium restriction and support hose.   4.    S/p CABG x 1: LIMA to LAD  5.    Stage IV lung CA. S/p resection with recurrent malignant pleural effusion. On oral chemotherapy. Scans earlier this month show no progression.  6.   DM 2: Managed by primary care provider  7.   HLD off statin due to history of elevated CK on Lipitor. We checked CPK on  last visit and it was still elevated. I don't think he is a candidate for statins. We discussed options of bempidoic acid or Repatha. He would like to work on diet and weight loss for now.  8. CKD stage 3a. Last creatinine 1.35.   9. Elevated CPK. Not on statin. I do not know the mechanism for this. ? Myositis. I have recommended follow up with Dr Ronnald Ramp to consider further evaluation.    Medication Adjustments/Labs and Tests Ordered: Current medicines are reviewed at length with the patient today.  Concerns regarding medicines are outlined above.  Medication changes, Labs and Tests ordered today are listed in the Patient Instructions below. There are no Patient Instructions on file for this visit.   Signed, Kahlil Cowans Martinique, MD  04/27/2019 2:08 PM    [facility] [address], [place], [place]  [address] Phone: [phone]; Fax: [phone]

## 2019-04-27 ENCOUNTER — Other Ambulatory Visit: Payer: Self-pay

## 2019-04-27 ENCOUNTER — Encounter: Payer: Self-pay | Admitting: Cardiology

## 2019-04-27 ENCOUNTER — Ambulatory Visit: Payer: Medicare Other | Admitting: Cardiology

## 2019-04-27 VITALS — BP 142/81 | HR 65 | Ht 69.0 in | Wt 233.0 lb

## 2019-04-27 DIAGNOSIS — E785 Hyperlipidemia, unspecified: Secondary | ICD-10-CM | POA: Diagnosis not present

## 2019-04-27 DIAGNOSIS — M609 Myositis, unspecified: Secondary | ICD-10-CM | POA: Insufficient documentation

## 2019-04-27 DIAGNOSIS — Z789 Other specified health status: Secondary | ICD-10-CM | POA: Insufficient documentation

## 2019-04-27 DIAGNOSIS — G720 Drug-induced myopathy: Secondary | ICD-10-CM

## 2019-04-27 DIAGNOSIS — N1831 Chronic kidney disease, stage 3a: Secondary | ICD-10-CM

## 2019-04-27 DIAGNOSIS — I25810 Atherosclerosis of coronary artery bypass graft(s) without angina pectoris: Secondary | ICD-10-CM | POA: Diagnosis not present

## 2019-04-27 DIAGNOSIS — Z86711 Personal history of pulmonary embolism: Secondary | ICD-10-CM

## 2019-04-27 DIAGNOSIS — Z9889 Other specified postprocedural states: Secondary | ICD-10-CM

## 2019-04-27 DIAGNOSIS — Z951 Presence of aortocoronary bypass graft: Secondary | ICD-10-CM

## 2019-04-27 DIAGNOSIS — I5032 Chronic diastolic (congestive) heart failure: Secondary | ICD-10-CM

## 2019-04-27 DIAGNOSIS — T466X5A Adverse effect of antihyperlipidemic and antiarteriosclerotic drugs, initial encounter: Secondary | ICD-10-CM

## 2019-05-01 ENCOUNTER — Telehealth: Payer: Self-pay

## 2019-05-01 NOTE — Telephone Encounter (Signed)
Oral Oncology Patient Advocate Encounter  Received notification from [employer] that prior authorization for Tagrisso is required.  PA submitted on CoverMyMeds Key [id number] Status is pending  Oral Oncology Clinic will continue to follow.  [place] Patient [place] Phone [phone] Fax [phone] 05/01/2019 8:28 AM

## 2019-05-01 NOTE — Telephone Encounter (Signed)
Oral Oncology Patient Advocate Encounter  Prior Authorization for Newman Nip has been approved.    PA# [id number] Effective dates: 05/01/19 through 05/10/20  Patients co-pay is $0  Oral Oncology Clinic will continue to follow.   [place] Patient [place] Phone [phone] Fax [phone] 05/01/2019 9:29 AM

## 2019-05-04 DIAGNOSIS — T466X5A Adverse effect of antihyperlipidemic and antiarteriosclerotic drugs, initial encounter: Secondary | ICD-10-CM | POA: Insufficient documentation

## 2019-05-04 DIAGNOSIS — G720 Drug-induced myopathy: Secondary | ICD-10-CM | POA: Insufficient documentation

## 2019-05-24 MED FILL — TAGRISSO 80 MG TABLET: 80 | 30 days supply | Qty: 30 | Fill #1

## 2019-05-26 ENCOUNTER — Encounter: Payer: Self-pay | Admitting: Internal Medicine

## 2019-06-06 ENCOUNTER — Ambulatory Visit: Payer: Medicare Other

## 2019-06-15 ENCOUNTER — Ambulatory Visit: Payer: Medicare PPO | Attending: Internal Medicine

## 2019-06-15 DIAGNOSIS — Z23 Encounter for immunization: Secondary | ICD-10-CM | POA: Insufficient documentation

## 2019-06-15 NOTE — Progress Notes (Signed)
   Covid-19 Vaccination Clinic  Name:  James [name]    MRN: [id number] DOB: 1951-04-24  06/15/2019  [name] was observed post Covid-19 immunization for 15 minutes without incidence. He was provided with Vaccine Information Sheet and instruction to access the V-Safe system.   [name] was instructed to call 911 with any severe reactions post vaccine: Marland Kitchen Difficulty breathing  . Swelling of your face and throat  . A fast heartbeat  . A bad rash all over your body  . Dizziness and weakness    Immunizations Administered    Name Date Dose VIS Date Route   Pfizer COVID-19 Vaccine 06/15/2019  4:46 PM 0.3 mL 04/21/2019 Intramuscular   Manufacturer: [place]   Lot: [id number]   [place]: [id number]

## 2019-06-19 MED FILL — TAGRISSO 80 MG TABLET: 80 | 30 days supply | Qty: 30 | Fill #2

## 2019-06-20 ENCOUNTER — Encounter: Payer: Self-pay | Admitting: Internal Medicine

## 2019-06-20 ENCOUNTER — Inpatient Hospital Stay: Payer: Medicare PPO

## 2019-06-20 ENCOUNTER — Inpatient Hospital Stay: Payer: Medicare PPO | Attending: Internal Medicine | Admitting: Internal Medicine

## 2019-06-20 ENCOUNTER — Other Ambulatory Visit: Payer: Self-pay

## 2019-06-20 DIAGNOSIS — R21 Rash and other nonspecific skin eruption: Secondary | ICD-10-CM | POA: Diagnosis not present

## 2019-06-20 DIAGNOSIS — C3491 Malignant neoplasm of unspecified part of right bronchus or lung: Secondary | ICD-10-CM

## 2019-06-20 DIAGNOSIS — K068 Other specified disorders of gingiva and edentulous alveolar ridge: Secondary | ICD-10-CM | POA: Diagnosis not present

## 2019-06-20 DIAGNOSIS — Z7901 Long term (current) use of anticoagulants: Secondary | ICD-10-CM | POA: Diagnosis not present

## 2019-06-20 DIAGNOSIS — J90 Pleural effusion, not elsewhere classified: Secondary | ICD-10-CM | POA: Diagnosis not present

## 2019-06-20 DIAGNOSIS — C3490 Malignant neoplasm of unspecified part of unspecified bronchus or lung: Secondary | ICD-10-CM | POA: Diagnosis not present

## 2019-06-20 DIAGNOSIS — Z79899 Other long term (current) drug therapy: Secondary | ICD-10-CM | POA: Insufficient documentation

## 2019-06-20 DIAGNOSIS — C3431 Malignant neoplasm of lower lobe, right bronchus or lung: Secondary | ICD-10-CM | POA: Insufficient documentation

## 2019-06-20 LAB — CMP (CANCER CENTER ONLY)
ALT: 29 U/L (ref 0–44)
AST: 24 U/L (ref 15–41)
Albumin: 3.9 g/dL (ref 3.5–5.0)
Alkaline Phosphatase: 56 U/L (ref 38–126)
Anion gap: 8 (ref 5–15)
BUN: 26 mg/dL — ABNORMAL HIGH (ref 8–23)
CO2: 23 mmol/L (ref 22–32)
Calcium: 9.1 mg/dL (ref 8.9–10.3)
Chloride: 110 mmol/L (ref 98–111)
Creatinine: 1.35 mg/dL — ABNORMAL HIGH (ref 0.61–1.24)
GFR, Est AFR Am: >60 mL/min (ref >60)
GFR, Estimated: 53 mL/min — ABNORMAL LOW (ref >60)
Glucose, Bld: 114 mg/dL — ABNORMAL HIGH (ref 70–99)
Potassium: 4.5 mmol/L (ref 3.5–5.1)
Sodium: 141 mmol/L (ref 135–145)
Total Bilirubin: 0.3 mg/dL (ref 0.3–1.2)
Total Protein: 6.8 g/dL (ref 6.5–8.1)

## 2019-06-20 LAB — CBC WITH DIFFERENTIAL (CANCER CENTER ONLY)
Abs Immature Granulocytes: 0.04 10*3/uL (ref 0.00–0.07)
Basophils Absolute: 0 10*3/uL (ref 0.0–0.1)
Basophils Relative: 0 %
Eosinophils Absolute: 0.1 10*3/uL (ref 0.0–0.5)
Eosinophils Relative: 2 %
HCT: 46.5 % (ref 39.0–52.0)
Hemoglobin: 15.6 g/dL (ref 13.0–17.0)
Immature Granulocytes: 1 %
Lymphocytes Relative: 22 %
Lymphs Abs: 1.3 10*3/uL (ref 0.7–4.0)
MCH: 31.2 pg (ref 26.0–34.0)
MCHC: 33.5 g/dL (ref 30.0–36.0)
MCV: 93 fL (ref 80.0–100.0)
Monocytes Absolute: 0.8 10*3/uL (ref 0.1–1.0)
Monocytes Relative: 13 %
Neutro Abs: 3.6 10*3/uL (ref 1.7–7.7)
Neutrophils Relative %: 62 %
Platelet Count: 135 10*3/uL — ABNORMAL LOW (ref 150–400)
RBC: 5 MIL/uL (ref 4.22–5.81)
RDW: 14.2 % (ref 11.5–15.5)
WBC Count: 5.8 10*3/uL (ref 4.0–10.5)
nRBC: 0 % (ref 0.0–0.2)

## 2019-06-20 NOTE — Progress Notes (Signed)
[facility] [phone]   [phone]  OFFICE PROGRESS NOTE  [name], MD [place] [address]  DIAGNOSIS: Recurrent non-small cell lung cancer, adenocarcinoma initially diagnosed as stage IA (T1c, N0, M0) non-small cell lung cancer, adenocarcinoma presented with right upper lobe lung nodule in April 2018 with recurrence in June 2019.  Biomarker Findings Microsatellite status - MS-Stable Tumor Mutational Burden - TMB-Low (3 Muts/Mb) Genomic Findings For a complete list of the genes assayed, please refer to the Appendix. EGFR exon 19 deletion ([id number]) CDKN2A/B loss NKX2-1 amplification 7 Disease relevant genes with no reportable alterations: KRAS, ALK, BRAF, MET, RET, ERBB2, ROS1   PRIOR THERAPY: Status post right upper lobectomy with lymph node dissection under the care of Dr. [name] on 09/02/2016.  CURRENT THERAPY: Tagrisso 80 mg p.o. daily.  First dose started November 05, 2017.  Status post 20 months of treatment.  INTERVAL HISTORY: James [name] 69 y.o. male returns to the clinic today for follow-up visit.  The patient continues to tolerate his treatment with Tagrisso fairly well.  He denied having any chest pain, shortness of breath, cough or hemoptysis.  He denied having any fever or chills.  He has no nausea, vomiting, diarrhea or constipation.  He has mild skin rash on the face.  He also has some bleeding gums at nighttime.  He is currently on Xarelto.  The patient is here today for evaluation and repeat blood work.  MEDICAL HISTORY: Past Medical History:  Diagnosis Date  . Adenocarcinoma of right lung, stage 1 ([place]) 09/06/2016  . Anxiety   . Arthritis    "knees, hips, back" (10/19/2012)  . Chronic diastolic congestive heart failure ([place])   . Chronic lower back pain   . Colon polyps    10/27/2002, repeat letter 09/17/2007  . Coronary artery disease   . Coronary artery disease involving native coronary  artery of native heart without angina pectoris   . Depressive disorder, not elsewhere classified    no meds  . Diabetes mellitus without complication ([place])    diet controlled- no med  (while in hosp 4/18 -elevated cbg  . Dyspnea   . Fasting hyperglycemia   . GERD (gastroesophageal reflux disease)   . Heart murmur   . Hemoptysis    abnormal CT Chest 01/29/10 - ? new GG changes RUL > not viz on plain cxr 02/26/2010  . Hypertension   . Mitral regurgitation    severe MR 08/2016  . MPN (myeloproliferative neoplasm) ([place])    1st detected 06/04/1998  . Obesity   . OSA on CPAP    last sleep study 10 years ago  . Other and unspecified hyperlipidemia   . Peripheral vascular disease ([place]) 08/2016   after lung surgey small clots in lungs,after hip dvt-5/16  . Pneumonia    4/18  . Positive PPD 1965   "non reactive in 2012" (10/19/2012)  . Routine general medical examination at a health care facility   . S/P CABG x 1 03/24/2017   LIMA to LAD  . S/P mitral valve repair 03/24/2017   Complex valvuloplasty including artificial Gore-tex neochord placement x6 and 28 mm Sorin Annuloflex posterior annuloplasty band  . Special screening for malignant neoplasm of prostate   . Spinal stenosis, unspecified region other than cervical   . Wrist pain, left     ALLERGIES:  is allergic to symbicort [budesonide-formoterol fumarate] and amoxicillin.  MEDICATIONS:  Current Outpatient Medications  Medication Sig Dispense Refill  . ezetimibe (ZETIA) 10 MG tablet Take 1 tablet (10 mg total) by mouth daily. 90 tablet 3  . furosemide (LASIX) 20 MG tablet Take 1/2 tablet 10 mg daily 30 tablet 6  . metoprolol tartrate (LOPRESSOR) 25 MG tablet Take 0.5 tablets (12.5 mg total) by mouth 2 (two) times daily. 90 tablet 3  . Omega-3 Fatty Acids (FISH OIL) 500 MG CAPS Take 500 mg by mouth daily.     . rivaroxaban (XARELTO) 20 MG TABS tablet Take 1 tablet (20 mg total) by mouth daily with supper. 90 tablet 3  . sildenafil  (REVATIO) 20 MG tablet Take 4 tablets (80 mg total) by mouth daily as needed. 60 tablet 5  . TAGRISSO 80 MG tablet TAKE 1 TABLET (80 MG TOTAL) BY MOUTH DAILY. 30 tablet 2   No current facility-administered medications for this visit.    SURGICAL HISTORY:  Past Surgical History:  Procedure Laterality Date  . ANTERIOR CRUCIATE LIGAMENT REPAIR Left 1967  . CARDIAC CATHETERIZATION  2000  . CHEST TUBE INSERTION Right 10/19/2012   post bronch  . COLONOSCOPY W/ POLYPECTOMY    . CORONARY ARTERY BYPASS GRAFT N/A 03/24/2017   Procedure: CORONARY ARTERY BYPASS GRAFTING (CABG)x1 using left internal mammary artery, LIMA-LAD;  Surgeon: Rexene Alberts, MD;  Location: [facility];  Service: Open Heart Surgery;  Laterality: N/A;  . FLEXIBLE BRONCHOSCOPY  10/19/2012   Flexible video fiberoptic bronchoscopy with electromagnetic navigation and biopsies.  . INTRAVASCULAR PRESSURE WIRE/FFR STUDY N/A 08/11/2016   Procedure: Intravascular Pressure Wire/FFR Study;  Surgeon: [name], MD;  Location: [facility];  Service: Cardiovascular;  Laterality: N/A;  . IR ANGIOGRAM PULMONARY BILATERAL SELECTIVE  02/06/2018  . IR ANGIOGRAM SELECTIVE EACH ADDITIONAL VESSEL  02/06/2018  . IR ANGIOGRAM SELECTIVE EACH ADDITIONAL VESSEL  02/06/2018  . IR INFUSION THROMBOL ARTERIAL INITIAL (MS)  02/06/2018  . IR INFUSION THROMBOL ARTERIAL INITIAL (MS)  02/06/2018  . IR THROMB F/U EVAL ART/VEN FINAL DAY (MS)  02/07/2018  . IR US GUIDE VASC ACCESS RIGHT  02/06/2018  . LOBECTOMY Right 09/02/2016   Procedure: RIGHT UPPER LOBECTOMY;  Surgeon: Melrose Nakayama, MD;  Location: [facility];  Service: Thoracic;  Laterality: Right;  . LYMPH NODE DISSECTION Right 09/02/2016   Procedure: LYMPH NODE DISSECTION, RIGHT LUNG;  Surgeon: Melrose Nakayama, MD;  Location: [facility];  Service: Thoracic;  Laterality: Right;  . MITRAL VALVE REPAIR N/A 03/24/2017   Procedure: MITRAL VALVE REPAIR (MVR) with Sorin Carbomedics Annuloflex size 28;  Surgeon:  Rexene Alberts, MD;  Location: [facility];  Service: Open Heart Surgery;  Laterality: N/A;  . RIGHT/LEFT HEART CATH AND CORONARY ANGIOGRAPHY N/A 08/11/2016   Procedure: Right/Left Heart Cath and Coronary Angiography;  Surgeon: [name], MD;  Location: [facility];  Service: Cardiovascular;  Laterality: N/A;  . TEE WITHOUT CARDIOVERSION N/A 07/17/2016   Procedure: TRANSESOPHAGEAL ECHOCARDIOGRAM (TEE);  Surgeon: [name], MD;  Location: [facility];  Service: Cardiovascular;  Laterality: N/A;  . TEE WITHOUT CARDIOVERSION N/A 03/24/2017   Procedure: TRANSESOPHAGEAL ECHOCARDIOGRAM (TEE);  Surgeon: Rexene Alberts, MD;  Location: [facility];  Service: Open Heart Surgery;  Laterality: N/A;  . TONSILLECTOMY  1950's  . TOTAL HIP ARTHROPLASTY Left 10/05/2014   dr [name]  . TOTAL HIP ARTHROPLASTY Left 10/05/2014   Procedure: LEFT TOTAL HIP ARTHROPLASTY ANTERIOR APPROACH;  Surgeon: [name], MD;  Location: [facility];  Service: Orthopedics;  Laterality: Left;  .  VIDEO ASSISTED THORACOSCOPY (VATS)/WEDGE RESECTION Right 09/02/2016   Procedure: VIDEO ASSISTED THORACOSCOPY (VATS)/RIGHT UPPER LOBE WEDGE RESECTION;  Surgeon: Melrose Nakayama, MD;  Location: [facility];  Service: Thoracic;  Laterality: Right;  Marland Kitchen VIDEO BRONCHOSCOPY WITH ENDOBRONCHIAL NAVIGATION N/A 10/19/2012   Procedure: VIDEO BRONCHOSCOPY WITH ENDOBRONCHIAL NAVIGATION;  Surgeon: [name], MD;  Location: [facility];  Service: Thoracic;  Laterality: N/A;  . WRIST RECONSTRUCTION Left 12/2009   'proximal row carpectomy" [name]    REVIEW OF SYSTEMS:  A comprehensive review of systems was negative except for: Integument/breast: positive for rash   PHYSICAL EXAMINATION: General appearance: alert, cooperative and no distress Head: Normocephalic, without obvious abnormality, atraumatic Neck: no adenopathy, no JVD, supple, symmetrical, trachea midline and thyroid not enlarged, symmetric, no tenderness/mass/nodules Lymph nodes: Cervical,  supraclavicular, and axillary nodes normal. Resp: clear to auscultation bilaterally Back: symmetric, no curvature. ROM normal. No CVA tenderness. Cardio: regular rate and rhythm, S1, S2 normal, no murmur, click, rub or gallop GI: soft, non-tender; bowel sounds normal; no masses,  no organomegaly Extremities: extremities normal, atraumatic, no cyanosis or edema  ECOG PERFORMANCE STATUS: 1 - Symptomatic but completely ambulatory  Blood pressure 136/67, pulse (!) 59, temperature 98.7 F (37.1 C), temperature source Temporal, resp. rate 18, height 5' 9"  (1.753 m), weight 234 lb 4.8 oz (106.3 kg), SpO2 96 %.  LABORATORY DATA: Lab Results  Component Value Date   WBC 5.8 06/20/2019   HGB 15.6 06/20/2019   HCT 46.5 06/20/2019   MCV 93.0 06/20/2019   PLT 135 (L) 06/20/2019      Chemistry      Component Value Date/Time   NA 140 04/14/2019 1030   NA 142 10/01/2016 1534   K 4.2 04/14/2019 1030   K 4.2 10/01/2016 1534   CL 107 04/14/2019 1030   CO2 26 04/14/2019 1030   CO2 28 10/01/2016 1534   BUN 22 04/14/2019 1030   BUN 15.9 10/01/2016 1534   CREATININE 1.35 (H) 04/14/2019 1030   CREATININE 1.10 02/08/2017 1101   CREATININE 1.5 (H) 10/01/2016 1534      Component Value Date/Time   CALCIUM 9.1 04/14/2019 1030   CALCIUM 9.6 10/01/2016 1534   ALKPHOS 58 04/21/2019 0849   ALKPHOS 65 10/01/2016 1534   AST 30 04/21/2019 0849   AST 37 04/14/2019 1030   AST 20 10/01/2016 1534   ALT 32 04/21/2019 0849   ALT 40 04/14/2019 1030   ALT 24 10/01/2016 1534   BILITOT 0.4 04/21/2019 0849   BILITOT 0.4 04/14/2019 1030   BILITOT 0.46 10/01/2016 1534       RADIOGRAPHIC STUDIES: No results found.  ASSESSMENT AND PLAN: This is a very pleasant 69 years old white male with a stage Ia non-small cell lung cancer status post right upper lobectomy with lymph node dissection on September 02, 2016. The patient he was on observation since that time. Unfortunately there are some concerning findings with  nodular density in the posterior right lower lobe as well as increase and right pleural effusion suspicious for disease recurrence. He underwent ultrasound-guided right thoracentesis and the cytology of the pleural fluid was consistent with recurrent adenocarcinoma. He had molecular studies performed by [facility] 1 that showed positive EGFR mutation with deletion 53. The patient was started on treatment with Tagrisso 80 mg p.o. daily on 11/05/2017.  He status post 20 months of treatment. The patient continues to tolerate his treatment with Tagrisso fairly well except for mild skin rash. I recommended for him to continue  on the same dose for now. For the skin rash, he was advised to apply hydrocortisone cream if needed. I will see him back for follow-up visit in 2 months for evaluation with repeat CT scan of the chest, abdomen pelvis for restaging of his disease. He was advised to call immediately if he has any concerning symptoms in the interval. The patient voices understanding of current disease status and treatment options and is in agreement with the current care plan. All questions were answered. The patient knows to call the clinic with any problems, questions or concerns. We can certainly see the patient much sooner if necessary.  Disclaimer: This note was dictated with voice recognition software. Similar sounding words can inadvertently be transcribed and may not be corrected upon review.

## 2019-06-21 ENCOUNTER — Telehealth: Payer: Self-pay | Admitting: Internal Medicine

## 2019-06-21 NOTE — Telephone Encounter (Signed)
Scheduled per los. Called and spoke with patient. Confirmed appt 

## 2019-06-23 ENCOUNTER — Ambulatory Visit: Payer: Medicare Other

## 2019-06-28 DIAGNOSIS — G4733 Obstructive sleep apnea (adult) (pediatric): Secondary | ICD-10-CM | POA: Diagnosis not present

## 2019-07-07 DIAGNOSIS — I129 Hypertensive chronic kidney disease with stage 1 through stage 4 chronic kidney disease, or unspecified chronic kidney disease: Secondary | ICD-10-CM | POA: Diagnosis not present

## 2019-07-07 DIAGNOSIS — N2581 Secondary hyperparathyroidism of renal origin: Secondary | ICD-10-CM | POA: Diagnosis not present

## 2019-07-07 DIAGNOSIS — N1831 Chronic kidney disease, stage 3a: Secondary | ICD-10-CM | POA: Diagnosis not present

## 2019-07-10 ENCOUNTER — Ambulatory Visit: Payer: Medicare PPO | Attending: Internal Medicine

## 2019-07-10 ENCOUNTER — Other Ambulatory Visit: Payer: Self-pay | Admitting: Nephrology

## 2019-07-10 DIAGNOSIS — Z23 Encounter for immunization: Secondary | ICD-10-CM

## 2019-07-10 DIAGNOSIS — N1831 Chronic kidney disease, stage 3a: Secondary | ICD-10-CM

## 2019-07-10 NOTE — Progress Notes (Signed)
   Covid-19 Vaccination Clinic  Name:  KAYVION ARNESON    MRN: [id number] DOB: July 11, 1950  07/10/2019  Mr. Tinkham was observed post Covid-19 immunization for 15 minutes without incidence. He was provided with Vaccine Information Sheet and instruction to access the V-Safe system.   Mr. Hawker was instructed to call 911 with any severe reactions post vaccine: Marland Kitchen Difficulty breathing  . Swelling of your face and throat  . A fast heartbeat  . A bad rash all over your body  . Dizziness and weakness    Immunizations Administered    Name Date Dose VIS Date Route   Pfizer COVID-19 Vaccine 07/10/2019  2:45 PM 0.3 mL 04/21/2019 Intramuscular   Manufacturer: [place]   Lot: [id number]   [place]: [id number]

## 2019-07-13 ENCOUNTER — Ambulatory Visit
Admission: RE | Admit: 2019-07-13 | Discharge: 2019-07-13 | Disposition: A | Discharge status: Home / Self Care | Payer: Medicare PPO | Source: Ambulatory Visit | Attending: Nephrology | Admitting: Nephrology

## 2019-07-13 DIAGNOSIS — N281 Cyst of kidney, acquired: Secondary | ICD-10-CM | POA: Diagnosis not present

## 2019-07-13 DIAGNOSIS — N1831 Chronic kidney disease, stage 3a: Secondary | ICD-10-CM

## 2019-07-17 ENCOUNTER — Other Ambulatory Visit: Payer: Self-pay | Admitting: Internal Medicine

## 2019-07-17 DIAGNOSIS — C3491 Malignant neoplasm of unspecified part of right bronchus or lung: Secondary | ICD-10-CM

## 2019-07-25 MED FILL — TAGRISSO 80 MG TABLET: 80 | 30 days supply | Qty: 30 | Fill #0

## 2019-08-01 DIAGNOSIS — G4733 Obstructive sleep apnea (adult) (pediatric): Secondary | ICD-10-CM | POA: Diagnosis not present

## 2019-08-14 ENCOUNTER — Other Ambulatory Visit: Payer: Self-pay

## 2019-08-14 ENCOUNTER — Inpatient Hospital Stay: Payer: Medicare PPO | Attending: Internal Medicine

## 2019-08-14 ENCOUNTER — Encounter (HOSPITAL_COMMUNITY): Payer: Self-pay

## 2019-08-14 ENCOUNTER — Ambulatory Visit (HOSPITAL_COMMUNITY)
Admission: RE | Admit: 2019-08-14 | Discharge: 2019-08-14 | Disposition: A | Discharge status: Home / Self Care | Payer: Medicare PPO | Source: Ambulatory Visit | Attending: Internal Medicine | Admitting: Internal Medicine

## 2019-08-14 DIAGNOSIS — C3411 Malignant neoplasm of upper lobe, right bronchus or lung: Secondary | ICD-10-CM | POA: Diagnosis not present

## 2019-08-14 DIAGNOSIS — Z86718 Personal history of other venous thrombosis and embolism: Secondary | ICD-10-CM | POA: Diagnosis not present

## 2019-08-14 DIAGNOSIS — Z79899 Other long term (current) drug therapy: Secondary | ICD-10-CM | POA: Diagnosis not present

## 2019-08-14 DIAGNOSIS — I110 Hypertensive heart disease with heart failure: Secondary | ICD-10-CM | POA: Diagnosis not present

## 2019-08-14 DIAGNOSIS — I2510 Atherosclerotic heart disease of native coronary artery without angina pectoris: Secondary | ICD-10-CM | POA: Insufficient documentation

## 2019-08-14 DIAGNOSIS — Z7901 Long term (current) use of anticoagulants: Secondary | ICD-10-CM | POA: Diagnosis not present

## 2019-08-14 DIAGNOSIS — C3490 Malignant neoplasm of unspecified part of unspecified bronchus or lung: Secondary | ICD-10-CM

## 2019-08-14 DIAGNOSIS — E119 Type 2 diabetes mellitus without complications: Secondary | ICD-10-CM | POA: Insufficient documentation

## 2019-08-14 DIAGNOSIS — N281 Cyst of kidney, acquired: Secondary | ICD-10-CM | POA: Diagnosis not present

## 2019-08-14 DIAGNOSIS — E669 Obesity, unspecified: Secondary | ICD-10-CM | POA: Insufficient documentation

## 2019-08-14 LAB — CMP (CANCER CENTER ONLY)
ALT: 37 U/L (ref 0–44)
AST: 27 U/L (ref 15–41)
Albumin: 3.9 g/dL (ref 3.5–5.0)
Alkaline Phosphatase: 52 U/L (ref 38–126)
Anion gap: 13 (ref 5–15)
BUN: 19 mg/dL (ref 8–23)
CO2: 23 mmol/L (ref 22–32)
Calcium: 9.1 mg/dL (ref 8.9–10.3)
Chloride: 107 mmol/L (ref 98–111)
Creatinine: 1.41 mg/dL — ABNORMAL HIGH (ref 0.61–1.24)
GFR, Est AFR Am: 58 mL/min — ABNORMAL LOW (ref >60)
GFR, Estimated: 50 mL/min — ABNORMAL LOW (ref >60)
Glucose, Bld: 131 mg/dL — ABNORMAL HIGH (ref 70–99)
Potassium: 4.8 mmol/L (ref 3.5–5.1)
Sodium: 143 mmol/L (ref 135–145)
Total Bilirubin: 0.6 mg/dL (ref 0.3–1.2)
Total Protein: 6.9 g/dL (ref 6.5–8.1)

## 2019-08-14 LAB — CBC WITH DIFFERENTIAL (CANCER CENTER ONLY)
Abs Immature Granulocytes: 0.03 10*3/uL (ref 0.00–0.07)
Basophils Absolute: 0 10*3/uL (ref 0.0–0.1)
Basophils Relative: 1 %
Eosinophils Absolute: 0.2 10*3/uL (ref 0.0–0.5)
Eosinophils Relative: 2 %
HCT: 47.9 % (ref 39.0–52.0)
Hemoglobin: 15.6 g/dL (ref 13.0–17.0)
Immature Granulocytes: 0 %
Lymphocytes Relative: 18 %
Lymphs Abs: 1.3 10*3/uL (ref 0.7–4.0)
MCH: 30.3 pg (ref 26.0–34.0)
MCHC: 32.6 g/dL (ref 30.0–36.0)
MCV: 93 fL (ref 80.0–100.0)
Monocytes Absolute: 0.8 10*3/uL (ref 0.1–1.0)
Monocytes Relative: 11 %
Neutro Abs: 4.8 10*3/uL (ref 1.7–7.7)
Neutrophils Relative %: 68 %
Platelet Count: 132 10*3/uL — ABNORMAL LOW (ref 150–400)
RBC: 5.15 MIL/uL (ref 4.22–5.81)
RDW: 14.2 % (ref 11.5–15.5)
WBC Count: 7.1 10*3/uL (ref 4.0–10.5)
nRBC: 0 % (ref 0.0–0.2)

## 2019-08-14 MED ORDER — SODIUM CHLORIDE (PF) 0.9 % IJ SOLN
INTRAMUSCULAR | Status: AC
  Filled 2019-08-14: qty 50

## 2019-08-14 MED ORDER — IOHEXOL 300 MG/ML  SOLN
80.0000 mL | Freq: Once | INTRAMUSCULAR | Status: AC | PRN
  Administered 2019-08-14: 100 mL via INTRAVENOUS

## 2019-08-16 ENCOUNTER — Inpatient Hospital Stay: Payer: Medicare PPO | Admitting: Internal Medicine

## 2019-08-16 ENCOUNTER — Encounter: Payer: Self-pay | Admitting: Internal Medicine

## 2019-08-16 ENCOUNTER — Other Ambulatory Visit: Payer: Self-pay

## 2019-08-16 VITALS — BP 133/63 | HR 66 | Temp 99.1°F | Resp 18 | Ht 69.0 in | Wt 236.2 lb

## 2019-08-16 DIAGNOSIS — Z7901 Long term (current) use of anticoagulants: Secondary | ICD-10-CM | POA: Diagnosis not present

## 2019-08-16 DIAGNOSIS — C3491 Malignant neoplasm of unspecified part of right bronchus or lung: Secondary | ICD-10-CM

## 2019-08-16 DIAGNOSIS — E119 Type 2 diabetes mellitus without complications: Secondary | ICD-10-CM | POA: Diagnosis not present

## 2019-08-16 DIAGNOSIS — Z86718 Personal history of other venous thrombosis and embolism: Secondary | ICD-10-CM | POA: Diagnosis not present

## 2019-08-16 DIAGNOSIS — Z79899 Other long term (current) drug therapy: Secondary | ICD-10-CM | POA: Diagnosis not present

## 2019-08-16 DIAGNOSIS — I2510 Atherosclerotic heart disease of native coronary artery without angina pectoris: Secondary | ICD-10-CM | POA: Diagnosis not present

## 2019-08-16 DIAGNOSIS — Z5111 Encounter for antineoplastic chemotherapy: Secondary | ICD-10-CM

## 2019-08-16 DIAGNOSIS — E669 Obesity, unspecified: Secondary | ICD-10-CM | POA: Diagnosis not present

## 2019-08-16 DIAGNOSIS — C3411 Malignant neoplasm of upper lobe, right bronchus or lung: Secondary | ICD-10-CM | POA: Diagnosis not present

## 2019-08-16 DIAGNOSIS — I110 Hypertensive heart disease with heart failure: Secondary | ICD-10-CM | POA: Diagnosis not present

## 2019-08-16 NOTE — Progress Notes (Signed)
[facility] Telephone:([phone]   Fax:([phone]) [phone]  OFFICE PROGRESS NOTE  [name], MD [place] [address]  DIAGNOSIS: Recurrent non-small cell lung cancer, adenocarcinoma initially diagnosed as stage IA (T1c, N0, M0) non-small cell lung cancer, adenocarcinoma presented with right upper lobe lung nodule in April 2018 with recurrence in June 2019.  Biomarker Findings Microsatellite status - MS-Stable Tumor Mutational Burden - TMB-Low (3 Muts/Mb) Genomic Findings For a complete list of the genes assayed, please refer to the Appendix. EGFR exon 19 deletion ([id number]>Y) CDKN2A/B loss NKX2-1 amplification 7 Disease relevant genes with no reportable alterations: KRAS, ALK, BRAF, MET, RET, ERBB2, ROS1   PRIOR THERAPY: Status post right upper lobectomy with lymph node dissection under the care of Dr. [name] on 09/02/2016.  CURRENT THERAPY: Tagrisso 80 mg p.o. daily.  First dose started November 05, 2017.  Status post 22 months of treatment.  INTERVAL HISTORY: James [name] 69 y.o. male returns to the clinic today for follow-up visit.  The patient is feeling fine today with no concerning complaints except for mild shortness of breath with exertion as well as mild rash around his mouth.  He denied having any chest pain, cough or hemoptysis.  He denied having any fever or chills.  He has no nausea, vomiting, diarrhea or constipation.  He has no headache or visual changes.  He continues to tolerate his treatment with Tagrisso fairly well.  The patient had repeat CT scan of the chest, abdomen pelvis performed recently and is here for evaluation and discussion of his discuss results.  MEDICAL HISTORY: Past Medical History:  Diagnosis Date  . Adenocarcinoma of right lung, stage 1 ([facility]) 09/06/2016  . Anxiety   . Arthritis    "knees, hips, back" (10/19/2012)  . Chronic diastolic congestive heart failure ([facility])   . Chronic lower back pain   .  Colon polyps    10/27/2002, repeat letter 09/17/2007  . Coronary artery disease   . Coronary artery disease involving native coronary artery of native heart without angina pectoris   . Depressive disorder, not elsewhere classified    no meds  . Diabetes mellitus without complication ([place])    diet controlled- no med  (while in hosp 4/18 -elevated cbg  . Dyspnea   . Fasting hyperglycemia   . GERD (gastroesophageal reflux disease)   . Heart murmur   . Hemoptysis    abnormal CT Chest 01/29/10 - ? new GG changes RUL > not viz on plain cxr 02/26/2010  . Hypertension   . Mitral regurgitation    severe MR 08/2016  . MPN (myeloproliferative neoplasm) ([facility])    1st detected 06/04/1998  . Obesity   . OSA on CPAP    last sleep study 10 years ago  . Other and unspecified hyperlipidemia   . Peripheral vascular disease ([facility]) 08/2016   after lung surgey small clots in lungs,after hip dvt-5/16  . Pneumonia    4/18  . Positive PPD 1965   "non reactive in 2012" (10/19/2012)  . Routine general medical examination at a health care facility   . S/P CABG x 1 03/24/2017   LIMA to LAD  . S/P mitral valve repair 03/24/2017   Complex valvuloplasty including artificial Gore-tex neochord placement x6 and 28 mm Sorin Annuloflex posterior annuloplasty band  . Special screening for malignant neoplasm of prostate   . Spinal stenosis, unspecified region other than cervical   . Wrist pain, left  ALLERGIES:  is allergic to symbicort [budesonide-formoterol fumarate] and amoxicillin.  MEDICATIONS:  Current Outpatient Medications  Medication Sig Dispense Refill  . ezetimibe (ZETIA) 10 MG tablet Take 1 tablet (10 mg total) by mouth daily. 90 tablet 3  . furosemide (LASIX) 20 MG tablet Take 1/2 tablet 10 mg daily 30 tablet 6  . metoprolol tartrate (LOPRESSOR) 25 MG tablet Take 0.5 tablets (12.5 mg total) by mouth 2 (two) times daily. 90 tablet 3  . Omega-3 Fatty Acids (FISH OIL) 500 MG CAPS Take 500 mg by mouth  daily.     . rivaroxaban (XARELTO) 20 MG TABS tablet Take 1 tablet (20 mg total) by mouth daily with supper. 90 tablet 3  . sildenafil (REVATIO) 20 MG tablet Take 4 tablets (80 mg total) by mouth daily as needed. 60 tablet 5  . TAGRISSO 80 MG tablet TAKE 1 TABLET (80 MG TOTAL) BY MOUTH DAILY. 30 tablet 2   No current facility-administered medications for this visit.    SURGICAL HISTORY:  Past Surgical History:  Procedure Laterality Date  . ANTERIOR CRUCIATE LIGAMENT REPAIR Left 1967  . CARDIAC CATHETERIZATION  2000  . CHEST TUBE INSERTION Right 10/19/2012   post bronch  . COLONOSCOPY W/ POLYPECTOMY    . CORONARY ARTERY BYPASS GRAFT N/A 03/24/2017   Procedure: CORONARY ARTERY BYPASS GRAFTING (CABG)x1 using left internal mammary artery, LIMA-LAD;  Surgeon: Rexene Alberts, MD;  Location: [facility];  Service: Open Heart Surgery;  Laterality: N/A;  . FLEXIBLE BRONCHOSCOPY  10/19/2012   Flexible video fiberoptic bronchoscopy with electromagnetic navigation and biopsies.  . INTRAVASCULAR PRESSURE WIRE/FFR STUDY N/A 08/11/2016   Procedure: Intravascular Pressure Wire/FFR Study;  Surgeon: [name], MD;  Location: [facility];  Service: Cardiovascular;  Laterality: N/A;  . IR ANGIOGRAM PULMONARY BILATERAL SELECTIVE  02/06/2018  . IR ANGIOGRAM SELECTIVE EACH ADDITIONAL VESSEL  02/06/2018  . IR ANGIOGRAM SELECTIVE EACH ADDITIONAL VESSEL  02/06/2018  . IR INFUSION THROMBOL ARTERIAL INITIAL (MS)  02/06/2018  . IR INFUSION THROMBOL ARTERIAL INITIAL (MS)  02/06/2018  . IR THROMB F/U EVAL ART/VEN FINAL DAY (MS)  02/07/2018  . IR US GUIDE VASC ACCESS RIGHT  02/06/2018  . LOBECTOMY Right 09/02/2016   Procedure: RIGHT UPPER LOBECTOMY;  Surgeon: Melrose Nakayama, MD;  Location: [facility];  Service: Thoracic;  Laterality: Right;  . LYMPH NODE DISSECTION Right 09/02/2016   Procedure: LYMPH NODE DISSECTION, RIGHT LUNG;  Surgeon: Melrose Nakayama, MD;  Location: [facility];  Service: Thoracic;  Laterality:  Right;  . MITRAL VALVE REPAIR N/A 03/24/2017   Procedure: MITRAL VALVE REPAIR (MVR) with Sorin Carbomedics Annuloflex size 28;  Surgeon: Rexene Alberts, MD;  Location: [facility];  Service: Open Heart Surgery;  Laterality: N/A;  . RIGHT/LEFT HEART CATH AND CORONARY ANGIOGRAPHY N/A 08/11/2016   Procedure: Right/Left Heart Cath and Coronary Angiography;  Surgeon: [name], MD;  Location: [facility];  Service: Cardiovascular;  Laterality: N/A;  . TEE WITHOUT CARDIOVERSION N/A 07/17/2016   Procedure: TRANSESOPHAGEAL ECHOCARDIOGRAM (TEE);  Surgeon: [name], MD;  Location[facility];  Service: Cardiovascular;  Laterality: N/A;  . TEE WITHOUT CARDIOVERSION N/A 03/24/2017   Procedure: TRANSESOPHAGEAL ECHOCARDIOGRAM (TEE);  Surgeon: Rexene Alberts, MD;  Location: [facility];  Service: Open Heart Surgery;  Laterality: N/A;  . TONSILLECTOMY  1950's  . TOTAL HIP ARTHROPLASTY Left 10/05/2014   dr [name]  . TOTAL HIP ARTHROPLASTY Left 10/05/2014   Procedure: LEFT TOTAL HIP ARTHROPLASTY ANTERIOR  APPROACH;  Surgeon: [name], MD;  Location: [facility];  Service: Orthopedics;  Laterality: Left;  Marland Kitchen VIDEO ASSISTED THORACOSCOPY (VATS)/WEDGE RESECTION Right 09/02/2016   Procedure: VIDEO ASSISTED THORACOSCOPY (VATS)/RIGHT UPPER LOBE WEDGE RESECTION;  Surgeon: Melrose Nakayama, MD;  Location: [facility];  Service: Thoracic;  Laterality: Right;  Marland Kitchen VIDEO BRONCHOSCOPY WITH ENDOBRONCHIAL NAVIGATION N/A 10/19/2012   Procedure: VIDEO BRONCHOSCOPY WITH ENDOBRONCHIAL NAVIGATION;  Surgeon: [name], MD;  Location: [facility];  Service: Thoracic;  Laterality: N/A;  . WRIST RECONSTRUCTION Left 12/2009   'proximal row carpectomy" [name]    REVIEW OF SYSTEMS:  Constitutional: positive for fatigue Eyes: negative Ears, nose, mouth, throat, and face: negative Respiratory: positive for dyspnea on exertion Cardiovascular: negative Gastrointestinal: negative Genitourinary:negative Integument/breast:  negative Hematologic/lymphatic: negative Musculoskeletal:negative Neurological: negative Behavioral/Psych: negative Endocrine: negative Allergic/Immunologic: negative   PHYSICAL EXAMINATION: General appearance: alert, cooperative and no distress Head: Normocephalic, without obvious abnormality, atraumatic Neck: no adenopathy, no JVD, supple, symmetrical, trachea midline and thyroid not enlarged, symmetric, no tenderness/mass/nodules Lymph nodes: Cervical, supraclavicular, and axillary nodes normal. Resp: clear to auscultation bilaterally Back: symmetric, no curvature. ROM normal. No CVA tenderness. Cardio: regular rate and rhythm, S1, S2 normal, no murmur, click, rub or gallop GI: soft, non-tender; bowel sounds normal; no masses,  no organomegaly Extremities: extremities normal, atraumatic, no cyanosis or edema Neurologic: Alert and oriented X 3, normal strength and tone. Normal symmetric reflexes. Normal coordination and gait  ECOG PERFORMANCE STATUS: 1 - Symptomatic but completely ambulatory  Blood pressure 133/63, pulse 66, temperature 99.1 F (37.3 C), temperature source Temporal, resp. rate 18, height _0  (1.753 m), weight 236 lb 3.2 oz (107.1 kg), SpO2 97 %.  LABORATORY DATA: Lab Results  Component Value Date   WBC 7.1 08/14/2019   HGB 15.6 08/14/2019   HCT 47.9 08/14/2019   MCV 93.0 08/14/2019   PLT 132 (L) 08/14/2019      Chemistry      Component Value Date/Time   NA 143 08/14/2019 0941   NA 142 10/01/2016 1534   K 4.8 08/14/2019 0941   K 4.2 10/01/2016 1534   CL 107 08/14/2019 0941   CO2 23 08/14/2019 0941   CO2 28 10/01/2016 1534   BUN 19 08/14/2019 0941   BUN 15.9 10/01/2016 1534   CREATININE 1.41 (H) 08/14/2019 0941   CREATININE 1.10 02/08/2017 1101   CREATININE 1.5 (H) 10/01/2016 1534      Component Value Date/Time   CALCIUM 9.1 08/14/2019 0941   CALCIUM 9.6 10/01/2016 1534   ALKPHOS 52 08/14/2019 0941   ALKPHOS 65 10/01/2016 1534   AST 27  08/14/2019 0941   AST 20 10/01/2016 1534   ALT 37 08/14/2019 0941   ALT 24 10/01/2016 1534   BILITOT 0.6 08/14/2019 0941   BILITOT 0.46 10/01/2016 1534       RADIOGRAPHIC STUDIES: CT Chest W Contrast  Result Date: 08/14/2019 CLINICAL DATA:  Non-small-cell lung cancer. Restaging. EXAM: CT CHEST, ABDOMEN, AND PELVIS WITH CONTRAST TECHNIQUE: Multidetector CT imaging of the chest, abdomen and pelvis was performed following the standard protocol during bolus administration of intravenous contrast. CONTRAST:  152m OMNIPAQUE IOHEXOL 300 MG/ML  SOLN COMPARISON:  04/14/2019 FINDINGS: CT CHEST FINDINGS Cardiovascular: The heart size is normal. No substantial pericardial effusion. Coronary artery calcification is evident. Atherosclerotic calcification is noted in the wall of the thoracic aorta. Mediastinum/Nodes: No mediastinal lymphadenopathy. There is no hilar lymphadenopathy. The esophagus has normal imaging features. There is no axillary lymphadenopathy. Lungs/Pleura: Status post right  upper lobectomy. Subpleural scarring noted towards the right base. 2 mm paraspinal right lower lobe nodule on 71/6 is stable. Another tiny peripheral right lower lobe nodule on 75/6 is stable and appears calcified consistent with granuloma. Tiny loculated effusion in the posterior right costophrenic sulcus is similar to previous. Clustered tree-in-bud nodularity posterior left upper lobe (39/6) is stable and compatible sequelae of atypical infection. Scarring in the lateral left costophrenic sulcus (125/6) is unchanged. No focal airspace consolidation. Musculoskeletal: No worrisome lytic or sclerotic osseous abnormality. CT ABDOMEN PELVIS FINDINGS Hepatobiliary: Stable cysts in the lateral segment left liver and posterior right liver. Tiny subcapsular low-density lesion in the dome of liver is also unchanged. There is no evidence for gallstones, gallbladder wall thickening, or pericholecystic fluid. No intrahepatic or  extrahepatic biliary dilation. Pancreas: No focal mass lesion. No dilatation of the main duct. No intraparenchymal cyst. No peripancreatic edema. Spleen: No splenomegaly. No focal mass lesion. Adrenals/Urinary Tract: No adrenal nodule or mass. Right kidney unremarkable. 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 Central sinus cysts again noted left kidney with no substantial change in the 3.2 cm cortical cyst noted lower pole left kidney No evidence for hydroureter. The urinary bladder appears normal for the degree of distention. Stomach/Bowel: Stomach is unremarkable. No gastric wall thickening. No evidence of outlet obstruction. Duodenum is normally positioned as is the ligament of Treitz. No small bowel wall thickening. No small bowel dilatation. The terminal ileum is normal. The appendix is normal. No gross colonic mass. No colonic wall thickening. Diverticular changes are noted in the left colon without evidence of diverticulitis. Vascular/Lymphatic: There is abdominal aortic atherosclerosis without aneurysm. There is no gastrohepatic or hepatoduodenal ligament lymphadenopathy. No retroperitoneal or mesenteric lymphadenopathy. No pelvic sidewall lymphadenopathy. Reproductive: The prostate gland and seminal vesicles are unremarkable. Other: No intraperitoneal free fluid. Musculoskeletal: Status post left hip replacement. No worrisome lytic or sclerotic osseous abnormality. IMPRESSION: 1. Stable exam. No new or progressive findings to suggest recurrent or metastatic disease. 2. Hepatic and left renal cysts again noted. 3. Aortic Atherosclerosis (ICD10-I70.0). Electronically Signed   By: Misty Stanley M.D.   On: 08/14/2019 12:37   CT Abdomen Pelvis W Contrast  Result Date: 08/14/2019 CLINICAL DATA:  Non-small-cell lung cancer. Restaging. EXAM: CT CHEST, ABDOMEN, AND PELVIS WITH CONTRAST TECHNIQUE: Multidetector CT imaging of the chest, abdomen and  pelvis was performed following the standard protocol during bolus administration of intravenous contrast. CONTRAST:  110m OMNIPAQUE IOHEXOL 300 MG/ML  SOLN COMPARISON:  04/14/2019 FINDINGS: CT CHEST FINDINGS Cardiovascular: The heart size is normal. No substantial pericardial effusion. Coronary artery calcification is evident. Atherosclerotic calcification is noted in the wall of the thoracic aorta. Mediastinum/Nodes: No mediastinal lymphadenopathy. There is no hilar lymphadenopathy. The esophagus has normal imaging features. There is no axillary lymphadenopathy. Lungs/Pleura: Status post right upper lobectomy. Subpleural scarring noted towards the right base. 2 mm paraspinal right lower lobe nodule on 71/6 is stable. Another tiny peripheral right lower lobe nodule on 75/6 is stable and appears calcified consistent with granuloma. Tiny loculated effusion in the posterior right costophrenic sulcus is similar to previous. Clustered tree-in-bud nodularity posterior left upper lobe (39/6) is stable and compatible sequelae of atypical infection. Scarring in the lateral left costophrenic sulcus (125/6) is unchanged. No focal airspace consolidation. Musculoskeletal: No worrisome lytic or sclerotic osseous abnormality. CT ABDOMEN PELVIS FINDINGS Hepatobiliary: Stable cysts in the lateral segment left liver and posterior right liver. Tiny subcapsular low-density lesion in  the dome of liver is also unchanged. There is no evidence for gallstones, gallbladder wall thickening, or pericholecystic fluid. No intrahepatic or extrahepatic biliary dilation. Pancreas: No focal mass lesion. No dilatation of the main duct. No intraparenchymal cyst. No peripancreatic edema. Spleen: No splenomegaly. No focal mass lesion. Adrenals/Urinary Tract: No adrenal nodule or mass. Right kidney unremarkable. 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 Central sinus cysts again noted left  kidney with no substantial change in the 3.2 cm cortical cyst noted lower pole left kidney No evidence for hydroureter. The urinary bladder appears normal for the degree of distention. Stomach/Bowel: Stomach is unremarkable. No gastric wall thickening. No evidence of outlet obstruction. Duodenum is normally positioned as is the ligament of Treitz. No small bowel wall thickening. No small bowel dilatation. The terminal ileum is normal. The appendix is normal. No gross colonic mass. No colonic wall thickening. Diverticular changes are noted in the left colon without evidence of diverticulitis. Vascular/Lymphatic: There is abdominal aortic atherosclerosis without aneurysm. There is no gastrohepatic or hepatoduodenal ligament lymphadenopathy. No retroperitoneal or mesenteric lymphadenopathy. No pelvic sidewall lymphadenopathy. Reproductive: The prostate gland and seminal vesicles are unremarkable. Other: No intraperitoneal free fluid. Musculoskeletal: Status post left hip replacement. No worrisome lytic or sclerotic osseous abnormality. IMPRESSION: 1. Stable exam. No new or progressive findings to suggest recurrent or metastatic disease. 2. Hepatic and left renal cysts again noted. 3. Aortic Atherosclerosis (ICD10-I70.0). Electronically Signed   By: Misty Stanley M.D.   On: 08/14/2019 12:37    ASSESSMENT AND PLAN: This is a very pleasant 69 years old white male with a stage Ia non-small cell lung cancer status post right upper lobectomy with lymph node dissection on September 02, 2016. The patient he was on observation since that time. Unfortunately there are some concerning findings with nodular density in the posterior right lower lobe as well as increase and right pleural effusion suspicious for disease recurrence. He underwent ultrasound-guided right thoracentesis and the cytology of the pleural fluid was consistent with recurrent adenocarcinoma. He had molecular studies performed by foundation 1 that showed  positive EGFR mutation with deletion 34. The patient was started on treatment with Tagrisso 80 mg p.o. daily on 11/05/2017.  He status post 22 months of treatment. The patient continues to tolerate his treatment well with no concerning complaints except for mild skin rash around his mouth. He had repeat CT scan of the chest, abdomen pelvis performed recently.  I personally and independently reviewed the scans and discussed the results with the patient today. His scan showed no concerning findings for disease progression. I recommended for him to continue his current treatment with Tagrisso with the same dose. For the skin rash, he will continue to apply hydrocortisone cream to these areas and he will consult with his dermatologist for any further recommendation. I will see him back for follow-up visit in 2 months for evaluation and repeat blood work. He was advised to call immediately if he has any concerning symptoms in the interval. The patient voices understanding of current disease status and treatment options and is in agreement with the current care plan. All questions were answered. The patient knows to call the clinic with any problems, questions or concerns. We can certainly see the patient much sooner if necessary.  Disclaimer: This note was dictated with voice recognition software. Similar sounding words can inadvertently be transcribed and may not be corrected upon review.

## 2019-08-18 ENCOUNTER — Telehealth: Payer: Self-pay | Admitting: Internal Medicine

## 2019-08-18 NOTE — Telephone Encounter (Signed)
Scheduled per 4/7 los. Called and spoke with pt, confirmed 6/7 appt

## 2019-08-22 MED FILL — TAGRISSO 80 MG TABLET: 80 | 30 days supply | Qty: 30 | Fill #1

## 2019-10-04 DIAGNOSIS — N1831 Chronic kidney disease, stage 3a: Secondary | ICD-10-CM | POA: Diagnosis not present

## 2019-10-04 DIAGNOSIS — N2581 Secondary hyperparathyroidism of renal origin: Secondary | ICD-10-CM | POA: Diagnosis not present

## 2019-10-04 DIAGNOSIS — I129 Hypertensive chronic kidney disease with stage 1 through stage 4 chronic kidney disease, or unspecified chronic kidney disease: Secondary | ICD-10-CM | POA: Diagnosis not present

## 2019-10-11 DIAGNOSIS — G4733 Obstructive sleep apnea (adult) (pediatric): Secondary | ICD-10-CM | POA: Diagnosis not present

## 2019-10-16 ENCOUNTER — Telehealth: Payer: Self-pay | Admitting: Internal Medicine

## 2019-10-16 ENCOUNTER — Encounter: Payer: Self-pay | Admitting: Internal Medicine

## 2019-10-16 ENCOUNTER — Inpatient Hospital Stay: Payer: Medicare PPO | Attending: Internal Medicine | Admitting: Internal Medicine

## 2019-10-16 ENCOUNTER — Other Ambulatory Visit: Payer: Self-pay

## 2019-10-16 ENCOUNTER — Inpatient Hospital Stay: Payer: Medicare PPO

## 2019-10-16 ENCOUNTER — Other Ambulatory Visit: Payer: Self-pay | Admitting: Internal Medicine

## 2019-10-16 VITALS — BP 133/73 | HR 69 | Temp 97.5°F | Resp 20 | Ht 69.0 in | Wt 233.6 lb

## 2019-10-16 DIAGNOSIS — Z96642 Presence of left artificial hip joint: Secondary | ICD-10-CM | POA: Diagnosis not present

## 2019-10-16 DIAGNOSIS — J910 Malignant pleural effusion: Secondary | ICD-10-CM | POA: Diagnosis not present

## 2019-10-16 DIAGNOSIS — C3491 Malignant neoplasm of unspecified part of right bronchus or lung: Secondary | ICD-10-CM

## 2019-10-16 DIAGNOSIS — I10 Essential (primary) hypertension: Secondary | ICD-10-CM | POA: Diagnosis not present

## 2019-10-16 DIAGNOSIS — Z86010 Personal history of colonic polyps: Secondary | ICD-10-CM | POA: Insufficient documentation

## 2019-10-16 DIAGNOSIS — G4733 Obstructive sleep apnea (adult) (pediatric): Secondary | ICD-10-CM | POA: Diagnosis not present

## 2019-10-16 DIAGNOSIS — Z79899 Other long term (current) drug therapy: Secondary | ICD-10-CM | POA: Insufficient documentation

## 2019-10-16 DIAGNOSIS — C3431 Malignant neoplasm of lower lobe, right bronchus or lung: Secondary | ICD-10-CM | POA: Diagnosis not present

## 2019-10-16 DIAGNOSIS — Z902 Acquired absence of lung [part of]: Secondary | ICD-10-CM | POA: Insufficient documentation

## 2019-10-16 DIAGNOSIS — E119 Type 2 diabetes mellitus without complications: Secondary | ICD-10-CM | POA: Insufficient documentation

## 2019-10-16 DIAGNOSIS — Z7901 Long term (current) use of anticoagulants: Secondary | ICD-10-CM | POA: Diagnosis not present

## 2019-10-16 DIAGNOSIS — Z5111 Encounter for antineoplastic chemotherapy: Secondary | ICD-10-CM

## 2019-10-16 DIAGNOSIS — Z951 Presence of aortocoronary bypass graft: Secondary | ICD-10-CM | POA: Diagnosis not present

## 2019-10-16 DIAGNOSIS — C3490 Malignant neoplasm of unspecified part of unspecified bronchus or lung: Secondary | ICD-10-CM

## 2019-10-16 LAB — CBC WITH DIFFERENTIAL (CANCER CENTER ONLY)
Abs Immature Granulocytes: 0.02 10*3/uL (ref 0.00–0.07)
Basophils Absolute: 0 10*3/uL (ref 0.0–0.1)
Basophils Relative: 1 %
Eosinophils Absolute: 0.1 10*3/uL (ref 0.0–0.5)
Eosinophils Relative: 2 %
HCT: 46.4 % (ref 39.0–52.0)
Hemoglobin: 15.4 g/dL (ref 13.0–17.0)
Immature Granulocytes: 0 %
Lymphocytes Relative: 24 %
Lymphs Abs: 1.5 10*3/uL (ref 0.7–4.0)
MCH: 30.9 pg (ref 26.0–34.0)
MCHC: 33.2 g/dL (ref 30.0–36.0)
MCV: 93 fL (ref 80.0–100.0)
Monocytes Absolute: 0.7 10*3/uL (ref 0.1–1.0)
Monocytes Relative: 11 %
Neutro Abs: 3.8 10*3/uL (ref 1.7–7.7)
Neutrophils Relative %: 62 %
Platelet Count: 134 10*3/uL — ABNORMAL LOW (ref 150–400)
RBC: 4.99 MIL/uL (ref 4.22–5.81)
RDW: 14.2 % (ref 11.5–15.5)
WBC Count: 6.1 10*3/uL (ref 4.0–10.5)
nRBC: 0 % (ref 0.0–0.2)

## 2019-10-16 LAB — CMP (CANCER CENTER ONLY)
ALT: 31 U/L (ref 0–44)
AST: 27 U/L (ref 15–41)
Albumin: 3.8 g/dL (ref 3.5–5.0)
Alkaline Phosphatase: 57 U/L (ref 38–126)
Anion gap: 9 (ref 5–15)
BUN: 18 mg/dL (ref 8–23)
CO2: 24 mmol/L (ref 22–32)
Calcium: 9.2 mg/dL (ref 8.9–10.3)
Chloride: 107 mmol/L (ref 98–111)
Creatinine: 1.52 mg/dL — ABNORMAL HIGH (ref 0.61–1.24)
GFR, Est AFR Am: 53 mL/min — ABNORMAL LOW (ref >60)
GFR, Estimated: 46 mL/min — ABNORMAL LOW (ref >60)
Glucose, Bld: 122 mg/dL — ABNORMAL HIGH (ref 70–99)
Potassium: 4.9 mmol/L (ref 3.5–5.1)
Sodium: 140 mmol/L (ref 135–145)
Total Bilirubin: 0.4 mg/dL (ref 0.3–1.2)
Total Protein: 6.7 g/dL (ref 6.5–8.1)

## 2019-10-16 NOTE — Telephone Encounter (Signed)
Scheduled appt per 6/7 los.  Printed calendar and avs

## 2019-10-16 NOTE — Progress Notes (Signed)
[place] [phone]   [phone]  OFFICE PROGRESS NOTE  [name], MD [place] [address]  DIAGNOSIS: Recurrent non-small cell lung cancer, adenocarcinoma initially diagnosed as stage IA (T1c, N0, M0) non-small cell lung cancer, adenocarcinoma presented with right upper lobe lung nodule in April 2018 with recurrence in June 2019.  Biomarker Findings Microsatellite status - MS-Stable Tumor Mutational Burden - TMB-Low (3 Muts/Mb) Genomic Findings For a complete list of the genes assayed, please refer to the Appendix. EGFR exon 19 deletion ([id number]>A) CDKN2A/B loss NKX2-1 amplification 7 Disease relevant genes with no reportable alterations: KRAS, ALK, BRAF, MET, RET, ERBB2, ROS1   PRIOR THERAPY: Status post right upper lobectomy with lymph node dissection under the care of Dr. [name] on 09/02/2016.  CURRENT THERAPY: Tagrisso 80 mg p.o. daily.  First dose started November 05, 2017.  Status post 23 months of treatment.  INTERVAL HISTORY: James [name] 69 y.o. male returns to the clinic today for 49-monthfollow-up visit accompanied by his wife.  The patient is feeling fine today with no concerning complaints except for shortness of breath with exertion.  He still able to walk more than 3 miles almost every day.  He denied having any significant chest pain, cough or hemoptysis.  He denied having any fever or chills.  He has no nausea, vomiting, diarrhea or constipation.  He has occasional gum bleeds secondary to his treatment with Xarelto.  The patient has no significant weight loss or night sweats.  He continues to tolerate his treatment with Tagrisso fairly well.  Is here today for evaluation and repeat blood work.   MEDICAL HISTORY: Past Medical History:  Diagnosis Date  . Adenocarcinoma of right lung, stage 1 ([place] 09/06/2016  . Anxiety   . Arthritis    "knees, hips, back" (10/19/2012)  . Chronic diastolic  congestive heart failure ([facility]   . Chronic lower back pain   . Colon polyps    10/27/2002, repeat letter 09/17/2007  . Coronary artery disease   . Coronary artery disease involving native coronary artery of native heart without angina pectoris   . Depressive disorder, not elsewhere classified    no meds  . Diabetes mellitus without complication ([facility]    diet controlled- no med  (while in hosp 4/18 -elevated cbg  . Dyspnea   . Fasting hyperglycemia   . GERD (gastroesophageal reflux disease)   . Heart murmur   . Hemoptysis    abnormal CT Chest 01/29/10 - ? new GG changes RUL > not viz on plain cxr 02/26/2010  . Hypertension   . Mitral regurgitation    severe MR 08/2016  . MPN (myeloproliferative neoplasm) ([facility]    1st detected 06/04/1998  . Obesity   . OSA on CPAP    last sleep study 10 years ago  . Other and unspecified hyperlipidemia   . Peripheral vascular disease ([facility] 08/2016   after lung surgey small clots in lungs,after hip dvt-5/16  . Pneumonia    4/18  . Positive PPD 1965   "non reactive in 2012" (10/19/2012)  . Routine general medical examination at a health care facility   . S/P CABG x 1 03/24/2017   LIMA to LAD  . S/P mitral valve repair 03/24/2017   Complex valvuloplasty including artificial Gore-tex neochord placement x6 and 28 mm Sorin Annuloflex posterior annuloplasty band  . Special screening for malignant neoplasm of prostate   . Spinal stenosis, unspecified  region other than cervical   . Wrist pain, left     ALLERGIES:  is allergic to symbicort [budesonide-formoterol fumarate] and amoxicillin.  MEDICATIONS:  Current Outpatient Medications  Medication Sig Dispense Refill  . ezetimibe (ZETIA) 10 MG tablet Take 1 tablet (10 mg total) by mouth daily. 90 tablet 3  . furosemide (LASIX) 20 MG tablet Take 1/2 tablet 10 mg daily 30 tablet 6  . metoprolol tartrate (LOPRESSOR) 25 MG tablet Take 0.5 tablets (12.5 mg total) by mouth 2 (two) times daily. 90 tablet 3  .  Omega-3 Fatty Acids (FISH OIL) 500 MG CAPS Take 500 mg by mouth daily.     . rivaroxaban (XARELTO) 20 MG TABS tablet Take 1 tablet (20 mg total) by mouth daily with supper. 90 tablet 3  . sildenafil (REVATIO) 20 MG tablet Take 4 tablets (80 mg total) by mouth daily as needed. 60 tablet 5  . TAGRISSO 80 MG tablet TAKE 1 TABLET (80 MG TOTAL) BY MOUTH DAILY. 30 tablet 2   No current facility-administered medications for this visit.    SURGICAL HISTORY:  Past Surgical History:  Procedure Laterality Date  . ANTERIOR CRUCIATE LIGAMENT REPAIR Left 1967  . CARDIAC CATHETERIZATION  2000  . CHEST TUBE INSERTION Right 10/19/2012   post bronch  . COLONOSCOPY W/ POLYPECTOMY    . CORONARY ARTERY BYPASS GRAFT N/A 03/24/2017   Procedure: CORONARY ARTERY BYPASS GRAFTING (CABG)x1 using left internal mammary artery, LIMA-LAD;  Surgeon: Rexene Alberts, MD;  Location: [facility];  Service: Open Heart Surgery;  Laterality: N/A;  . FLEXIBLE BRONCHOSCOPY  10/19/2012   Flexible video fiberoptic bronchoscopy with electromagnetic navigation and biopsies.  . INTRAVASCULAR PRESSURE WIRE/FFR STUDY N/A 08/11/2016   Procedure: Intravascular Pressure Wire/FFR Study;  Surgeon: [name], MD;  Location: [facility];  Service: Cardiovascular;  Laterality: N/A;  . IR ANGIOGRAM PULMONARY BILATERAL SELECTIVE  02/06/2018  . IR ANGIOGRAM SELECTIVE EACH ADDITIONAL VESSEL  02/06/2018  . IR ANGIOGRAM SELECTIVE EACH ADDITIONAL VESSEL  02/06/2018  . IR INFUSION THROMBOL ARTERIAL INITIAL (MS)  02/06/2018  . IR INFUSION THROMBOL ARTERIAL INITIAL (MS)  02/06/2018  . IR THROMB F/U EVAL ART/VEN FINAL DAY (MS)  02/07/2018  . IR US GUIDE VASC ACCESS RIGHT  02/06/2018  . LOBECTOMY Right 09/02/2016   Procedure: RIGHT UPPER LOBECTOMY;  Surgeon: Melrose Nakayama, MD;  Location: [facility];  Service: Thoracic;  Laterality: Right;  . LYMPH NODE DISSECTION Right 09/02/2016   Procedure: LYMPH NODE DISSECTION, RIGHT LUNG;  Surgeon: Melrose Nakayama, MD;  Location: [facility];  Service: Thoracic;  Laterality: Right;  . MITRAL VALVE REPAIR N/A 03/24/2017   Procedure: MITRAL VALVE REPAIR (MVR) with Sorin Carbomedics Annuloflex size 28;  Surgeon: Rexene Alberts, MD;  Location: [facility];  Service: Open Heart Surgery;  Laterality: N/A;  . RIGHT/LEFT HEART CATH AND CORONARY ANGIOGRAPHY N/A 08/11/2016   Procedure: Right/Left Heart Cath and Coronary Angiography;  Surgeon: [name], MD;  Location: [facility];  Service: Cardiovascular;  Laterality: N/A;  . TEE WITHOUT CARDIOVERSION N/A 07/17/2016   Procedure: TRANSESOPHAGEAL ECHOCARDIOGRAM (TEE);  Surgeon: [name], MD;  Location[facility];  Service: Cardiovascular;  Laterality: N/A;  . TEE WITHOUT CARDIOVERSION N/A 03/24/2017   Procedure: TRANSESOPHAGEAL ECHOCARDIOGRAM (TEE);  Surgeon: Rexene Alberts, MD;  Location: [facility];  Service: Open Heart Surgery;  Laterality: N/A;  . TONSILLECTOMY  1950's  . TOTAL HIP ARTHROPLASTY Left 10/05/2014   dr [name]  .  TOTAL HIP ARTHROPLASTY Left 10/05/2014   Procedure: LEFT TOTAL HIP ARTHROPLASTY ANTERIOR APPROACH;  Surgeon: [name], MD;  Location: [facility];  Service: Orthopedics;  Laterality: Left;  Marland Kitchen VIDEO ASSISTED THORACOSCOPY (VATS)/WEDGE RESECTION Right 09/02/2016   Procedure: VIDEO ASSISTED THORACOSCOPY (VATS)/RIGHT UPPER LOBE WEDGE RESECTION;  Surgeon: Melrose Nakayama, MD;  Location: [facility];  Service: Thoracic;  Laterality: Right;  Marland Kitchen VIDEO BRONCHOSCOPY WITH ENDOBRONCHIAL NAVIGATION N/A 10/19/2012   Procedure: VIDEO BRONCHOSCOPY WITH ENDOBRONCHIAL NAVIGATION;  Surgeon: [name], MD;  Location: [facility];  Service: Thoracic;  Laterality: N/A;  . WRIST RECONSTRUCTION Left 12/2009   'proximal row carpectomy" [name]    REVIEW OF SYSTEMS:  A comprehensive review of systems was negative except for: Respiratory: positive for dyspnea on exertion   PHYSICAL EXAMINATION: General appearance: alert, cooperative and no distress Head:  Normocephalic, without obvious abnormality, atraumatic Neck: no adenopathy, no JVD, supple, symmetrical, trachea midline and thyroid not enlarged, symmetric, no tenderness/mass/nodules Lymph nodes: Cervical, supraclavicular, and axillary nodes normal. Resp: clear to auscultation bilaterally Back: symmetric, no curvature. ROM normal. No CVA tenderness. Cardio: regular rate and rhythm, S1, S2 normal, no murmur, click, rub or gallop GI: soft, non-tender; bowel sounds normal; no masses,  no organomegaly Extremities: extremities normal, atraumatic, no cyanosis or edema  ECOG PERFORMANCE STATUS: 1 - Symptomatic but completely ambulatory  Blood pressure 133/73, pulse 69, temperature (!) 97.5 F (36.4 C), temperature source Temporal, resp. rate 20, height 5' 9"  (1.753 m), weight 233 lb 9.6 oz (106 kg), SpO2 97 %.  LABORATORY DATA: Lab Results  Component Value Date   WBC 6.1 10/16/2019   HGB 15.4 10/16/2019   HCT 46.4 10/16/2019   MCV 93.0 10/16/2019   PLT 134 (L) 10/16/2019      Chemistry      Component Value Date/Time   NA 143 08/14/2019 0941   NA 142 10/01/2016 1534   K 4.8 08/14/2019 0941   K 4.2 10/01/2016 1534   CL 107 08/14/2019 0941   CO2 23 08/14/2019 0941   CO2 28 10/01/2016 1534   BUN 19 08/14/2019 0941   BUN 15.9 10/01/2016 1534   CREATININE 1.41 (H) 08/14/2019 0941   CREATININE 1.10 02/08/2017 1101   CREATININE 1.5 (H) 10/01/2016 1534      Component Value Date/Time   CALCIUM 9.1 08/14/2019 0941   CALCIUM 9.6 10/01/2016 1534   ALKPHOS 52 08/14/2019 0941   ALKPHOS 65 10/01/2016 1534   AST 27 08/14/2019 0941   AST 20 10/01/2016 1534   ALT 37 08/14/2019 0941   ALT 24 10/01/2016 1534   BILITOT 0.6 08/14/2019 0941   BILITOT 0.46 10/01/2016 1534       RADIOGRAPHIC STUDIES: No results found.  ASSESSMENT AND PLAN: This is a very pleasant 69 years old white male with a stage Ia non-small cell lung cancer status post right upper lobectomy with lymph node dissection  on September 02, 2016. The patient he was on observation since that time. Unfortunately there are some concerning findings with nodular density in the posterior right lower lobe as well as increase and right pleural effusion suspicious for disease recurrence. He underwent ultrasound-guided right thoracentesis and the cytology of the pleural fluid was consistent with recurrent adenocarcinoma. He had molecular studies performed by [facility] 1 that showed positive EGFR mutation with deletion 21. The patient was started on treatment with Tagrisso 80 mg p.o. daily on 11/05/2017.  He status post 23 months of treatment. The patient continues to tolerate his treatment  fairly well with no concerning adverse effects. I recommended for him to continue his current treatment with Tagrisso with the same dose. I will see him back for follow-up visit in 2 months for evaluation with repeat CT scan of the chest, abdomen pelvis for restaging of his disease. The patient was advised to call immediately if he has any concerning symptoms in the interval. The patient voices understanding of current disease status and treatment options and is in agreement with the current care plan. All questions were answered. The patient knows to call the clinic with any problems, questions or concerns. We can certainly see the patient much sooner if necessary.  Disclaimer: This note was dictated with voice recognition software. Similar sounding words can inadvertently be transcribed and may not be corrected upon review.

## 2019-10-19 NOTE — Progress Notes (Signed)
Cardiology Office Note    Date:  10/20/2019   ID:  James [name], DOB April 15, 1951, MRN [id number]  PCP:  [name], MD  Cardiologist:  [name]   Chief Complaint  Patient presents with  . Coronary Artery Disease    History of Present Illness:  James [name] is a 69 y.o. male with PMH of DM II, HLD, and severe MR. he was originally noted to have a loud murmur by his primary care physician.  Initial echocardiogram suggested mild MR, however it also suggested partially flail leaflet.  MR was thought to be underestimated.  TEE showed a flail P3 segment with ruptured cord and a severe MR.  Cardiac catheterization performed on 08/11/2016 showed 65% mid to distal LAD lesion, FFR borderline at 0.81, EF 65%.  Normal cardiac output and RV/LV filling pressure.  He had a PET scan on 08/17/2016 that showed a hypermetabolic solid 2.6 cm anterior right upper lobe pulmonary nodule compatible with prior bronchogenic, no hypermetabolic thoracic lymphadenopathy or distal metastatic disease.  There were nonspecific and mild asymmetric hypermetabolism in the left pontine tonsil and the left tongue base region.  CT surgery recommended lobectomy for his lung mass with possible CABG with LIMA to LAD and MV repair.  He underwent right upper lobe lobectomy in April.  Pathology positive for adenocarcinoma stage Ia.  Serial CT scan was recommended by oncology.  Post procedure, he became very short of breath.  CTA was positive for a small PE, he was started on Xarelto.  Repeat echocardiogram obtained on 02/15/2017 showed EF 60-65%, persistent MR.  He eventually underwent mitral valve repair with Sorin Carbomedics Annuloglex size 28, LIMA to LAD by Dr. Roxy Manns. He was DC on coumadin instead of Xarelto. Completed 6 months of anticoagulation for provoked PE and then it was stopped.   When last seen in April he was progressing well. More recently in August CT showed a right pleural effusion. Thoracentesis was positive for  recurrent CA. He was started on Tagrisso by Dr. Inda Merlin. He was admitted on February 06, 2018 with submassive PE. He was treated with  EKOS with bilateral intra-arterial TPA, patient completed 6 days of IV heparin.The original plan was to transition to Xarelto after 5 days of IV heparin. However he developed left flank pain, hematuria and right leg painful swelling. Hence IV heparin was continued. Both Urology andOIrthopedics evaluated and cleared for transitioning to [place].  Xarelto was started on 02/12/2018.  Hematuria decreased  Right leg pain also improved. MRI did show evidence of hematoma that was improving. He had chronic DVT in right peroneal vein.  Since patient was started on Xarelto, aspirin discontinued to minimize bleeding risk. He had follow up CT on April 4 showing no evidence of progression of CA.   On follow up today he is doing well. No chest pain or increased SOB. No edema. Taking lasix 10 mg daily. Seeing Dr Posey Pronto now for CKD. On low salt diet. Denies any new complaints.   Past Medical History:  Diagnosis Date  . Adenocarcinoma of right lung, stage 1 ([place]) 09/06/2016  . Anxiety   . Arthritis    "knees, hips, back" (10/19/2012)  . Chronic diastolic congestive heart failure ([place])   . Chronic lower back pain   . Colon polyps    10/27/2002, repeat letter 09/17/2007  . Coronary artery disease   . Coronary artery disease involving native coronary artery of native heart without angina pectoris   . Depressive disorder, not  elsewhere classified    no meds  . Diabetes mellitus without complication ([place])    diet controlled- no med  (while in hosp 4/18 -elevated cbg  . Dyspnea   . Fasting hyperglycemia   . GERD (gastroesophageal reflux disease)   . Heart murmur   . Hemoptysis    abnormal CT Chest 01/29/10 - ? new GG changes RUL > not viz on plain cxr 02/26/2010  . Hypertension   . Mitral regurgitation    severe MR 08/2016  . MPN (myeloproliferative neoplasm) ([place])    1st  detected 06/04/1998  . Obesity   . OSA on CPAP    last sleep study 10 years ago  . Other and unspecified hyperlipidemia   . Peripheral vascular disease ([place]) 08/2016   after lung surgey small clots in lungs,after hip dvt-5/16  . Pneumonia    4/18  . Positive PPD 1965   "non reactive in 2012" (10/19/2012)  . Routine general medical examination at a health care facility   . S/P CABG x 1 03/24/2017   LIMA to LAD  . S/P mitral valve repair 03/24/2017   Complex valvuloplasty including artificial Gore-tex neochord placement x6 and 28 mm Sorin Annuloflex posterior annuloplasty band  . Special screening for malignant neoplasm of prostate   . Spinal stenosis, unspecified region other than cervical   . Wrist pain, left     Past Surgical History:  Procedure Laterality Date  . ANTERIOR CRUCIATE LIGAMENT REPAIR Left 1967  . CARDIAC CATHETERIZATION  2000  . CHEST TUBE INSERTION Right 10/19/2012   post bronch  . COLONOSCOPY W/ POLYPECTOMY    . CORONARY ARTERY BYPASS GRAFT N/A 03/24/2017   Procedure: CORONARY ARTERY BYPASS GRAFTING (CABG)x1 using left internal mammary artery, LIMA-LAD;  Surgeon: Rexene Alberts, MD;  Location: [facility];  Service: Open Heart Surgery;  Laterality: N/A;  . FLEXIBLE BRONCHOSCOPY  10/19/2012   Flexible video fiberoptic bronchoscopy with electromagnetic navigation and biopsies.  . INTRAVASCULAR PRESSURE WIRE/FFR STUDY N/A 08/11/2016   Procedure: Intravascular Pressure Wire/FFR Study;  Surgeon: Antwione Picotte M Martinique, MD;  Location: [facility];  Service: Cardiovascular;  Laterality: N/A;  . IR ANGIOGRAM PULMONARY BILATERAL SELECTIVE  02/06/2018  . IR ANGIOGRAM SELECTIVE EACH ADDITIONAL VESSEL  02/06/2018  . IR ANGIOGRAM SELECTIVE EACH ADDITIONAL VESSEL  02/06/2018  . IR INFUSION THROMBOL ARTERIAL INITIAL (MS)  02/06/2018  . IR INFUSION THROMBOL ARTERIAL INITIAL (MS)  02/06/2018  . IR THROMB F/U EVAL ART/VEN FINAL DAY (MS)  02/07/2018  . IR US GUIDE VASC ACCESS RIGHT  02/06/2018   . LOBECTOMY Right 09/02/2016   Procedure: RIGHT UPPER LOBECTOMY;  Surgeon: Melrose Nakayama, MD;  Location: [facility];  Service: Thoracic;  Laterality: Right;  . LYMPH NODE DISSECTION Right 09/02/2016   Procedure: LYMPH NODE DISSECTION, RIGHT LUNG;  Surgeon: Melrose Nakayama, MD;  Location: [facility];  Service: Thoracic;  Laterality: Right;  . MITRAL VALVE REPAIR N/A 03/24/2017   Procedure: MITRAL VALVE REPAIR (MVR) with Sorin Carbomedics Annuloflex size 28;  Surgeon: Rexene Alberts, MD;  Location: [facility];  Service: Open Heart Surgery;  Laterality: N/A;  . RIGHT/LEFT HEART CATH AND CORONARY ANGIOGRAPHY N/A 08/11/2016   Procedure: Right/Left Heart Cath and Coronary Angiography;  Surgeon: Melek Pownall M Martinique, MD;  Location: [facility];  Service: Cardiovascular;  Laterality: N/A;  . TEE WITHOUT CARDIOVERSION N/A 07/17/2016   Procedure: TRANSESOPHAGEAL ECHOCARDIOGRAM (TEE);  Surgeon: [name], MD;  Location: [facility];  Service: Cardiovascular;  Laterality: N/A;  . TEE WITHOUT CARDIOVERSION N/A 03/24/2017   Procedure: TRANSESOPHAGEAL ECHOCARDIOGRAM (TEE);  Surgeon: Rexene Alberts, MD;  Location: [facility];  Service: Open Heart Surgery;  Laterality: N/A;  . TONSILLECTOMY  1950's  . TOTAL HIP ARTHROPLASTY Left 10/05/2014   dr [name]  . TOTAL HIP ARTHROPLASTY Left 10/05/2014   Procedure: LEFT TOTAL HIP ARTHROPLASTY ANTERIOR APPROACH;  Surgeon: [name], MD;  Location: [facility];  Service: Orthopedics;  Laterality: Left;  Marland Kitchen VIDEO ASSISTED THORACOSCOPY (VATS)/WEDGE RESECTION Right 09/02/2016   Procedure: VIDEO ASSISTED THORACOSCOPY (VATS)/RIGHT UPPER LOBE WEDGE RESECTION;  Surgeon: Melrose Nakayama, MD;  Location: [facility];  Service: Thoracic;  Laterality: Right;  Marland Kitchen VIDEO BRONCHOSCOPY WITH ENDOBRONCHIAL NAVIGATION N/A 10/19/2012   Procedure: VIDEO BRONCHOSCOPY WITH ENDOBRONCHIAL NAVIGATION;  Surgeon: [name], MD;  Location: [facility];  Service: Thoracic;  Laterality: N/A;  . WRIST  RECONSTRUCTION Left 12/2009   'proximal row carpectomy" [name]    Current Medications: Outpatient Medications Prior to Visit  Medication Sig Dispense Refill  . Omega-3 Fatty Acids (FISH OIL) 500 MG CAPS Take 500 mg by mouth daily.     . sildenafil (REVATIO) 20 MG tablet Take 4 tablets (80 mg total) by mouth daily as needed. 60 tablet 5  . TAGRISSO 80 MG tablet TAKE 1 TABLET (80 MG TOTAL) BY MOUTH DAILY. 30 tablet 2  . ezetimibe (ZETIA) 10 MG tablet Take 1 tablet (10 mg total) by mouth daily. 90 tablet 3  . furosemide (LASIX) 20 MG tablet Take 1/2 tablet 10 mg daily 30 tablet 6  . metoprolol tartrate (LOPRESSOR) 25 MG tablet Take 0.5 tablets (12.5 mg total) by mouth 2 (two) times daily. 90 tablet 3  . rivaroxaban (XARELTO) 20 MG TABS tablet Take 1 tablet (20 mg total) by mouth daily with supper. 90 tablet 3   No facility-administered medications prior to visit.     Allergies:   Symbicort [budesonide-formoterol fumarate] and Amoxicillin   Social History   Socioeconomic History  . Marital status: Married    Spouse name: Not on file  . Number of children: 3  . Years of education: Not on file  . Highest education level: Not on file  Occupational History  . Occupation: Retired, Financial risk analyst  . Occupation: Retired, Real estate    Comment: Slow  Tobacco Use  . Smoking status: Never Smoker  . Smokeless tobacco: Never Used  Vaping Use  . Vaping Use: Never used  Substance and Sexual Activity  . Alcohol use: Yes    Alcohol/week: 1.0 standard drink    Types: 1 Cans of beer per week    Comment: none since 3 weeks  . Drug use: No  . Sexual activity: Never    Partners: Female  Other Topics Concern  . Not on file  Social History Narrative   HSG, [employer]. Married '73.  2 sons - '74,   '76;  1 daughter -  '77  6 grandchildren.   Work - IT consultant now retired. ACP - not fully discussed.                   Social Determinants of Health    Financial Resource Strain:   . Difficulty of Paying Living Expenses:   Food Insecurity:   . Worried About Charity fundraiser in the Last Year:   . Arboriculturist in the Last Year:   Transportation Needs:   . Film/video editor (Medical):   Marland Kitchen  Lack of Transportation (Non-Medical):   Physical Activity:   . Days of Exercise per Week:   . Minutes of Exercise per Session:   Stress:   . Feeling of Stress :   Social Connections:   . Frequency of Communication with Friends and Family:   . Frequency of Social Gatherings with Friends and Family:   . Attends Religious Services:   . Active Member of Clubs or Organizations:   . Attends Archivist Meetings:   Marland Kitchen Marital Status:      Family History:  The patient's family history includes Heart attack in his father; Heart disease in his father and paternal uncle; Heart failure in his mother.   ROS:   Please see the history of present illness.    ROS All other systems reviewed and are negative.   PHYSICAL EXAM:   VS:  BP (!) 142/78   Pulse 75   Ht 5\' 9"  (1.753 m)   Wt 235 lb 3.2 oz (106.7 kg)   SpO2 98%   BMI 34.73 kg/m    GENERAL:  Well appearing WM in NAD HEENT:  PERRL, EOMI, sclera are clear. Oropharynx is clear. NECK:  No jugular venous distention, carotid upstroke brisk and symmetric, no bruits, no thyromegaly or adenopathy LUNGS:  Clear to auscultation bilaterally CHEST:  Unremarkable HEART:  RRR,  PMI not displaced or sustained,S1 and S2 within normal limits, no S3, no S4: no clicks, no rubs, gr 2/6 systolic murmur LSB ABD:  Soft, nontender. BS +, no masses or bruits. No hepatomegaly, no splenomegaly EXT:  2 + pulses throughout, tr  edema, no cyanosis no clubbing SKIN:  Warm and dry.  No rashes NEURO:  Alert and oriented x 3. Cranial nerves II through XII intact. PSYCH:  Cognitively intact      Wt Readings from Last 3 Encounters:  10/20/19 235 lb 3.2 oz (106.7 kg)  10/16/19 233 lb 9.6 oz (106 kg)   08/16/19 236 lb 3.2 oz (107.1 kg)      Studies/Labs Reviewed:   EKG:  EKG is not ordered today.   Recent Labs: 12/13/2018: Pro B Natriuretic peptide (BNP) 122.0; TSH 2.10 10/16/2019: ALT 31; BUN 18; Creatinine 1.52; Hemoglobin 15.4; Platelet Count 134; Potassium 4.9; Sodium 140   Lipid Panel    Component Value Date/Time   CHOL 212 (H) 04/21/2019 0849   TRIG 125 04/21/2019 0849   TRIG 108 05/20/2006 0825   HDL 49 04/21/2019 0849   CHOLHDL 4.3 04/21/2019 0849   CHOLHDL 5 12/13/2018 1101   VLDL 26.0 12/13/2018 1101   LDLCALC 141 (H) 04/21/2019 0849   LDLDIRECT 177.0 07/24/2015 1052    Additional studies/ records that were reviewed today include:    Cath 08/11/2016 Conclusion     Prox LAD to Mid LAD lesion, 30 %stenosed.  Mid LAD to Dist LAD lesion, 65 %stenosed.  Prox RCA to Mid RCA lesion, 15 %stenosed.  The left ventricular systolic function is normal.  LV end diastolic pressure is normal.  The left ventricular ejection fraction is 55-65% by visual estimate.  LV end diastolic pressure is normal.   1. Borderline single vessel obstructive CAD involving the mid LAD. FFR 0.81 2. Normal LV function EF 65% 3. Normal right heart and LV filling pressures 4. Normal Cardiac output  Plan: will refer to CT surgery for consideration of MV repair. The stenosis in the LAD will be discussed. He is asymptomatic and I would favor treating it medically. If he develops symptoms in  the future it could be treated with PCI.        Echo 02/15/2017 LV EF: 60% -   65%  Study Conclusions  - Left ventricle: The cavity size was normal. Wall thickness was   normal. Systolic function was normal. The estimated ejection   fraction was in the range of 60% to 65%. - Aortic valve: AV is thickened, calcified with minimally   restricted motion. - Mitral valve: Prolapse fo the posterior mitral leaflet. MR is at   least moderate in intensity Calcified annulus. Mildly thickened   leaflets  . - Left atrium: The atrium was mildly dilated.    CABG with MVR 03/24/2017 Preoperative Diagnosis:        Severe Mitral Regurgitation  Single-vessel Coronary Artery Disease  Postoperative Diagnosis:    Same  Procedure:        Mitral Valve Repair             Complex valvuloplasty including artificial Gore-tex neochord placement x6             Sorin Carbomedics Annuloflex posterior annuloplasty band (size 28mm, catalog #[id number], serial #[id number])   Coronary Artery Bypass Grafting x 1              Left Internal Mammary Artery to Distal Left Anterior Descending Coronary Artery   Echo 06/10/17: Study Conclusions  - Left ventricle: The cavity size was normal. There was moderate   concentric hypertrophy. Systolic function was vigorous. The   estimated ejection fraction was in the range of 65% to 70%. Wall   motion was normal; there were no regional wall motion   abnormalities. Doppler parameters are consistent with abnormal   left ventricular relaxation (grade 1 diastolic dysfunction). - Aortic valve: Trileaflet; mildly thickened, mildly calcified   leaflets. Transvalvular velocity was minimally increased. There   was no stenosis. There was no regurgitation. - Mitral valve: S/P mitral valve repair with fixed posterior   leaflet. Transvalvular velocity was elevated. There was no   regurgitation. - Right ventricle: The cavity size was normal. Wall thickness was   normal. Systolic function was normal. - Tricuspid valve: There was no regurgitation. - Pericardium, extracardiac: A mild pericardial effusion was   identified posterior to the heart. Features were not consistent   with tamponade physiology.  Impressions:  - Since the last study on 02/15/2017 mitral valve is post repair.   Transmitral velocities are elevated with mean gradient 8 mmHg.   LVEF is hyperdynamic.  Echo 02/07/18: Study Conclusions  - Left ventricle: The cavity size was mildly reduced. Wall    thickness was increased in a pattern of mild LVH. Systolic   function was vigorous. The estimated ejection fraction was in the   range of 65% to 70%. - Mitral valve: s/p MV repair. Motion is difficult to see due to   poor acoustic windows Peak and mean gradiaeants through the valve   are 7 and 4 mm Hg respectively. MVA by P T1/2 is 1.75 cm2   consistent with mild MS - Right ventricle: RV is difficult to visualize, even with Definity   OVerall RVEF appears mildly reduced. The cavity size was mildly   dilated. - Right atrium: The atrium was mildly dilated.  Echo 01/29/19: IMPRESSIONS    1. Left ventricular ejection fraction, by visual estimation, is 50 to  55%. The left ventricle has normal function. Normal left ventricular size.  Left ventricular septal wall thickness was mildly increased. Mildly  increased left ventricular posterior  wall thickness. There is mildly increased left ventricular hypertrophy.  2. Elevated left ventricular end-diastolic pressure.  3. Left ventricular diastolic Doppler parameters are consistent with  pseudonormalization pattern of LV diastolic filling.  4. Global right ventricle has normal systolic function.The right  ventricular size is normal. No increase in right ventricular wall  thickness.  5. Left atrial size was normal.  6. Right atrial size was normal.  7. The mitral valve is normal in structure. Mild mitral valve  regurgitation. Mild mitral stenosis.  8. The tricuspid valve is normal in structure. Tricuspid valve  regurgitation is mild.  9. The aortic valve is normal in structure. Aortic valve regurgitation  was not visualized by color flow Doppler. Structurally normal aortic  valve, with no evidence of sclerosis or stenosis.  10. Mild thickening and calcification of the aortic valve leaflets.  11. The pulmonic valve was normal in structure. Pulmonic valve  regurgitation is trivial by color flow Doppler.  12. Normal pulmonary artery  systolic pressure.  13. The inferior vena cava is normal in size with greater than 50%  respiratory variability, suggesting right atrial pressure of 3 mmHg.   ASSESSMENT:    1. S/P mitral valve repair   2. Coronary artery disease involving coronary bypass graft of native heart without angina pectoris   3. Stage 3a chronic kidney disease   4. S/P CABG x 1      PLAN:  In order of problems listed above:  1. Severe MR s/p mitral valve annuloplasty/repair:  F/U Echo showed an excellent repair.  Recommend routine SBE prophylaxis.    2.   History of PE now recurrent on 02/06/18. Submassive with RV strain. S/p EKOS and lytic therapy. On Xarelto now.  Will need anticoagulation indefinitely.   3.    Chronic diastolic heart failure: weight is stable.  He does have chronic  Edema more related to venous insuffieciency. Continue low dose diuretic.  Continue sodium restriction and support hose.   4.    S/p CABG x 1: LIMA to LAD  5.    Stage IV lung CA. S/p resection with recurrent malignant pleural effusion. On oral chemotherapy. Scans in April showed no progression.  6.   DM 2: Managed by primary care provider  7.   HLD off statin due to history of elevated CK on Lipitor. Focusing on lifestyle modification for now.   8. CKD stage 3a. Followed by Nephrology.   9. Elevated CPK. Not on statin. Unclear cause of persistently elevated CK. Recommend he discuss with primary care.    Medication Adjustments/Labs and Tests Ordered: Current medicines are reviewed at length with the patient today.  Concerns regarding medicines are outlined above.  Medication changes, Labs and Tests ordered today are listed in the Patient Instructions below. There are no Patient Instructions on file for this visit.   Signed, Jelani Vreeland Martinique, MD  10/20/2019 4:13 PM    [facility] [address], [place], [place]  [address] Phone: [phone]; Fax: [phone]

## 2019-10-20 ENCOUNTER — Other Ambulatory Visit: Payer: Self-pay

## 2019-10-20 ENCOUNTER — Encounter: Payer: Self-pay | Admitting: Cardiology

## 2019-10-20 ENCOUNTER — Ambulatory Visit: Payer: Medicare PPO | Admitting: Cardiology

## 2019-10-20 VITALS — BP 142/78 | HR 75 | Ht 69.0 in | Wt 235.2 lb

## 2019-10-20 DIAGNOSIS — Z9889 Other specified postprocedural states: Secondary | ICD-10-CM | POA: Diagnosis not present

## 2019-10-20 DIAGNOSIS — Z951 Presence of aortocoronary bypass graft: Secondary | ICD-10-CM | POA: Diagnosis not present

## 2019-10-20 DIAGNOSIS — N1831 Chronic kidney disease, stage 3a: Secondary | ICD-10-CM | POA: Diagnosis not present

## 2019-10-20 DIAGNOSIS — I25810 Atherosclerosis of coronary artery bypass graft(s) without angina pectoris: Secondary | ICD-10-CM

## 2019-10-20 MED ORDER — RIVAROXABAN 20 MG PO TABS: 20.0000 mg | ORAL_TABLET | Freq: Every day | ORAL | 3 refills | Status: DC

## 2019-10-20 MED ORDER — METOPROLOL TARTRATE 25 MG PO TABS: 12.5000 mg | ORAL_TABLET | Freq: Two times a day (BID) | ORAL | 3 refills | Status: DC

## 2019-10-20 MED ORDER — FUROSEMIDE 20 MG PO TABS: 10.0000 mg | ORAL_TABLET | Freq: Every day | ORAL | 3 refills | Status: DC

## 2019-10-20 MED ORDER — EZETIMIBE 10 MG PO TABS: 10.0000 mg | ORAL_TABLET | Freq: Every day | ORAL | 3 refills | Status: DC

## 2019-10-20 NOTE — Patient Instructions (Signed)
Medication Instructions:  Your physician recommends that you continue on your current medications as directed. Please refer to the Current Medication list given to you today.  *If you need a refill on your cardiac medications before your next appointment, please call your pharmacy*  Lab Work: NONE If you have labs (blood work) drawn today and your tests are completely normal, you will receive your results only by: Marland Kitchen MyChart Message (if you have MyChart) OR . A paper copy in the mail If you have any lab test that is abnormal or we need to change your treatment, we will call you to review the results.  Testing/Procedures: NONE  Follow-Up: At [facility], you and your health needs are our priority.  As part of our continuing mission to provide you with exceptional heart care, we have created designated Provider Care Teams.  These Care Teams include your primary Cardiologist (physician) and Advanced Practice Providers (APPs -  Physician Assistants and Nurse Practitioners) who all work together to provide you with the care you need, when you need it.  We recommend signing up for the patient portal called "MyChart".  Sign up information is provided on this After Visit Summary.  MyChart is used to connect with patients for Virtual Visits (Telemedicine).  Patients are able to view lab/test results, encounter notes, upcoming appointments, etc.  Non-urgent messages can be sent to your provider as well.   To learn more about what you can do with MyChart, go to [email or web address].    Your next appointment:   6 month(s)  The format for your next appointment:   In Person  Provider:   You may see Peter Martinique, MD or one of the following Advanced Practice Providers on your designated Care Team:    Almyra Deforest, PA-C  Fabian Sharp, PA-C or   Roby Lofts, [place]

## 2019-11-04 DIAGNOSIS — G4733 Obstructive sleep apnea (adult) (pediatric): Secondary | ICD-10-CM | POA: Diagnosis not present

## 2019-11-21 MED FILL — TAGRISSO 80 MG TABLET: 80 | 30 days supply | Qty: 30 | Fill #1

## 2019-12-14 MED FILL — TAGRISSO 80 MG TABLET: 80 | 30 days supply | Qty: 30 | Fill #2

## 2019-12-15 ENCOUNTER — Ambulatory Visit (HOSPITAL_COMMUNITY)
Admission: RE | Admit: 2019-12-15 | Discharge: 2019-12-15 | Disposition: A | Discharge status: Home / Self Care | Payer: Medicare PPO | Source: Ambulatory Visit | Attending: Internal Medicine | Admitting: Internal Medicine

## 2019-12-15 ENCOUNTER — Other Ambulatory Visit: Payer: Self-pay

## 2019-12-15 ENCOUNTER — Inpatient Hospital Stay: Payer: Medicare PPO | Attending: Internal Medicine

## 2019-12-15 DIAGNOSIS — C3490 Malignant neoplasm of unspecified part of unspecified bronchus or lung: Secondary | ICD-10-CM

## 2019-12-15 DIAGNOSIS — Z79899 Other long term (current) drug therapy: Secondary | ICD-10-CM | POA: Diagnosis not present

## 2019-12-15 DIAGNOSIS — I110 Hypertensive heart disease with heart failure: Secondary | ICD-10-CM | POA: Diagnosis not present

## 2019-12-15 DIAGNOSIS — C3411 Malignant neoplasm of upper lobe, right bronchus or lung: Secondary | ICD-10-CM | POA: Insufficient documentation

## 2019-12-15 DIAGNOSIS — Z8719 Personal history of other diseases of the digestive system: Secondary | ICD-10-CM | POA: Insufficient documentation

## 2019-12-15 DIAGNOSIS — I2510 Atherosclerotic heart disease of native coronary artery without angina pectoris: Secondary | ICD-10-CM | POA: Insufficient documentation

## 2019-12-15 DIAGNOSIS — J984 Other disorders of lung: Secondary | ICD-10-CM | POA: Diagnosis not present

## 2019-12-15 DIAGNOSIS — K7689 Other specified diseases of liver: Secondary | ICD-10-CM | POA: Diagnosis not present

## 2019-12-15 DIAGNOSIS — Z86718 Personal history of other venous thrombosis and embolism: Secondary | ICD-10-CM | POA: Insufficient documentation

## 2019-12-15 DIAGNOSIS — E119 Type 2 diabetes mellitus without complications: Secondary | ICD-10-CM | POA: Insufficient documentation

## 2019-12-15 LAB — CMP (CANCER CENTER ONLY)
ALT: 27 U/L (ref 0–44)
AST: 29 U/L (ref 15–41)
Albumin: 3.8 g/dL (ref 3.5–5.0)
Alkaline Phosphatase: 48 U/L (ref 38–126)
Anion gap: 7 (ref 5–15)
BUN: 15 mg/dL (ref 8–23)
CO2: 24 mmol/L (ref 22–32)
Calcium: 9.4 mg/dL (ref 8.9–10.3)
Chloride: 108 mmol/L (ref 98–111)
Creatinine: 1.36 mg/dL — ABNORMAL HIGH (ref 0.61–1.24)
GFR, Est AFR Am: >60 mL/min (ref >60)
GFR, Estimated: 52 mL/min — ABNORMAL LOW (ref >60)
Glucose, Bld: 121 mg/dL — ABNORMAL HIGH (ref 70–99)
Potassium: 4.3 mmol/L (ref 3.5–5.1)
Sodium: 139 mmol/L (ref 135–145)
Total Bilirubin: 0.7 mg/dL (ref 0.3–1.2)
Total Protein: 6.7 g/dL (ref 6.5–8.1)

## 2019-12-15 LAB — CBC WITH DIFFERENTIAL (CANCER CENTER ONLY)
Abs Immature Granulocytes: 0.02 10*3/uL (ref 0.00–0.07)
Basophils Absolute: 0 10*3/uL (ref 0.0–0.1)
Basophils Relative: 1 %
Eosinophils Absolute: 0.2 10*3/uL (ref 0.0–0.5)
Eosinophils Relative: 3 %
HCT: 45.8 % (ref 39.0–52.0)
Hemoglobin: 15.2 g/dL (ref 13.0–17.0)
Immature Granulocytes: 0 %
Lymphocytes Relative: 20 %
Lymphs Abs: 1.2 10*3/uL (ref 0.7–4.0)
MCH: 31.3 pg (ref 26.0–34.0)
MCHC: 33.2 g/dL (ref 30.0–36.0)
MCV: 94.2 fL (ref 80.0–100.0)
Monocytes Absolute: 0.7 10*3/uL (ref 0.1–1.0)
Monocytes Relative: 12 %
Neutro Abs: 3.9 10*3/uL (ref 1.7–7.7)
Neutrophils Relative %: 64 %
Platelet Count: 131 10*3/uL — ABNORMAL LOW (ref 150–400)
RBC: 4.86 MIL/uL (ref 4.22–5.81)
RDW: 14.7 % (ref 11.5–15.5)
WBC Count: 6.1 10*3/uL (ref 4.0–10.5)
nRBC: 0 % (ref 0.0–0.2)

## 2019-12-15 MED ORDER — SODIUM CHLORIDE (PF) 0.9 % IJ SOLN
INTRAMUSCULAR | Status: AC
  Filled 2019-12-15: qty 50

## 2019-12-15 MED ORDER — IOHEXOL 300 MG/ML  SOLN
100.0000 mL | Freq: Once | INTRAMUSCULAR | Status: AC | PRN
  Administered 2019-12-15: 100 mL via INTRAVENOUS

## 2019-12-18 ENCOUNTER — Other Ambulatory Visit: Payer: Self-pay

## 2019-12-18 ENCOUNTER — Encounter: Payer: Self-pay | Admitting: Internal Medicine

## 2019-12-18 ENCOUNTER — Inpatient Hospital Stay: Payer: Medicare PPO | Admitting: Internal Medicine

## 2019-12-18 VITALS — BP 146/66 | HR 66 | Temp 98.9°F | Resp 19 | Ht 69.0 in | Wt 227.6 lb

## 2019-12-18 DIAGNOSIS — C3491 Malignant neoplasm of unspecified part of right bronchus or lung: Secondary | ICD-10-CM

## 2019-12-18 DIAGNOSIS — Z86718 Personal history of other venous thrombosis and embolism: Secondary | ICD-10-CM | POA: Diagnosis not present

## 2019-12-18 DIAGNOSIS — Z5111 Encounter for antineoplastic chemotherapy: Secondary | ICD-10-CM

## 2019-12-18 DIAGNOSIS — I2510 Atherosclerotic heart disease of native coronary artery without angina pectoris: Secondary | ICD-10-CM | POA: Diagnosis not present

## 2019-12-18 DIAGNOSIS — Z79899 Other long term (current) drug therapy: Secondary | ICD-10-CM | POA: Diagnosis not present

## 2019-12-18 DIAGNOSIS — C3411 Malignant neoplasm of upper lobe, right bronchus or lung: Secondary | ICD-10-CM | POA: Diagnosis not present

## 2019-12-18 DIAGNOSIS — Z8719 Personal history of other diseases of the digestive system: Secondary | ICD-10-CM | POA: Diagnosis not present

## 2019-12-18 DIAGNOSIS — I110 Hypertensive heart disease with heart failure: Secondary | ICD-10-CM | POA: Diagnosis not present

## 2019-12-18 DIAGNOSIS — E119 Type 2 diabetes mellitus without complications: Secondary | ICD-10-CM | POA: Diagnosis not present

## 2019-12-18 NOTE — Progress Notes (Signed)
[facility] [phone]   [phone]  OFFICE PROGRESS NOTE  [name], MD [place] [address]  DIAGNOSIS: Recurrent non-small cell lung cancer, adenocarcinoma initially diagnosed as stage IA (T1c, N0, M0) non-small cell lung cancer, adenocarcinoma presented with right upper lobe lung nodule in April 2018 with recurrence in June 2019.  Biomarker Findings Microsatellite status - MS-Stable Tumor Mutational Burden - TMB-Low (3 Muts/Mb) Genomic Findings For a complete list of the genes assayed, please refer to the Appendix. EGFR exon 19 deletion ([id number]) CDKN2A/B loss NKX2-1 amplification 7 Disease relevant genes with no reportable alterations: KRAS, ALK, BRAF, MET, RET, ERBB2, ROS1   PRIOR THERAPY: Status post right upper lobectomy with lymph node dissection under the care of Dr. [name] on 09/02/2016.  CURRENT THERAPY: Tagrisso 80 mg p.o. daily.  First dose started November 05, 2017.  Status post 25 months of treatment.  INTERVAL HISTORY: James [name] 69 y.o. male returns to the clinic today for follow-up visit accompanied by his wife.  The patient is feeling fine today with no concerning complaints except for shortness of breath with exertion.  He denied having any chest pain, cough or hemoptysis.  He denied having any fever or chills.  He has no nausea, vomiting, diarrhea or constipation.  He denied having any headache or visual changes.  He has no recent weight loss or night sweats.  He continues to tolerate his treatment with Tagrisso fairly well.  The patient has repeat CT scan of the chest, abdomen pelvis performed recently and he is here for evaluation and discussion of his discuss results.  MEDICAL HISTORY: Past Medical History:  Diagnosis Date  . Adenocarcinoma of right lung, stage 1 ([place]) 09/06/2016  . Anxiety   . Arthritis    "knees, hips, back" (10/19/2012)  . Chronic diastolic congestive heart  failure ([place])   . Chronic lower back pain   . Colon polyps    10/27/2002, repeat letter 09/17/2007  . Coronary artery disease   . Coronary artery disease involving native coronary artery of native heart without angina pectoris   . Depressive disorder, not elsewhere classified    no meds  . Diabetes mellitus without complication ([place])    diet controlled- no med  (while in hosp 4/18 -elevated cbg  . Dyspnea   . Fasting hyperglycemia   . GERD (gastroesophageal reflux disease)   . Heart murmur   . Hemoptysis    abnormal CT Chest 01/29/10 - ? new GG changes RUL > not viz on plain cxr 02/26/2010  . Hypertension   . Mitral regurgitation    severe MR 08/2016  . MPN (myeloproliferative neoplasm) ([place])    1st detected 06/04/1998  . Obesity   . OSA on CPAP    last sleep study 10 years ago  . Other and unspecified hyperlipidemia   . Peripheral vascular disease ([place]) 08/2016   after lung surgey small clots in lungs,after hip dvt-5/16  . Pneumonia    4/18  . Positive PPD 1965   "non reactive in 2012" (10/19/2012)  . Routine general medical examination at a health care facility   . S/P CABG x 1 03/24/2017   LIMA to LAD  . S/P mitral valve repair 03/24/2017   Complex valvuloplasty including artificial Gore-tex neochord placement x6 and 28 mm Sorin Annuloflex posterior annuloplasty band  . Special screening for malignant neoplasm of prostate   . Spinal stenosis, unspecified region other than cervical   .  Wrist pain, left     ALLERGIES:  is allergic to symbicort [budesonide-formoterol fumarate] and amoxicillin.  MEDICATIONS:  Current Outpatient Medications  Medication Sig Dispense Refill  . ezetimibe (ZETIA) 10 MG tablet Take 1 tablet (10 mg total) by mouth daily. 90 tablet 3  . furosemide (LASIX) 20 MG tablet Take 0.5 tablets (10 mg total) by mouth daily. Take 1/2 tablet 10 mg daily 45 tablet 3  . metoprolol tartrate (LOPRESSOR) 25 MG tablet Take 0.5 tablets (12.5 mg total) by mouth 2 (two)  times daily. 90 tablet 3  . Omega-3 Fatty Acids (FISH OIL) 500 MG CAPS Take 500 mg by mouth daily.     . rivaroxaban (XARELTO) 20 MG TABS tablet Take 1 tablet (20 mg total) by mouth daily with supper. 90 tablet 3  . sildenafil (REVATIO) 20 MG tablet Take 4 tablets (80 mg total) by mouth daily as needed. 60 tablet 5  . TAGRISSO 80 MG tablet TAKE 1 TABLET (80 MG TOTAL) BY MOUTH DAILY. 30 tablet 2   No current facility-administered medications for this visit.    SURGICAL HISTORY:  Past Surgical History:  Procedure Laterality Date  . ANTERIOR CRUCIATE LIGAMENT REPAIR Left 1967  . CARDIAC CATHETERIZATION  2000  . CHEST TUBE INSERTION Right 10/19/2012   post bronch  . COLONOSCOPY W/ POLYPECTOMY    . CORONARY ARTERY BYPASS GRAFT N/A 03/24/2017   Procedure: CORONARY ARTERY BYPASS GRAFTING (CABG)x1 using left internal mammary artery, LIMA-LAD;  Surgeon: Rexene Alberts, MD;  Location: [facility];  Service: Open Heart Surgery;  Laterality: N/A;  . FLEXIBLE BRONCHOSCOPY  10/19/2012   Flexible video fiberoptic bronchoscopy with electromagnetic navigation and biopsies.  . INTRAVASCULAR PRESSURE WIRE/FFR STUDY N/A 08/11/2016   Procedure: Intravascular Pressure Wire/FFR Study;  Surgeon: [name], MD;  Location: [facility];  Service: Cardiovascular;  Laterality: N/A;  . IR ANGIOGRAM PULMONARY BILATERAL SELECTIVE  02/06/2018  . IR ANGIOGRAM SELECTIVE EACH ADDITIONAL VESSEL  02/06/2018  . IR ANGIOGRAM SELECTIVE EACH ADDITIONAL VESSEL  02/06/2018  . IR INFUSION THROMBOL ARTERIAL INITIAL (MS)  02/06/2018  . IR INFUSION THROMBOL ARTERIAL INITIAL (MS)  02/06/2018  . IR THROMB F/U EVAL ART/VEN FINAL DAY (MS)  02/07/2018  . IR US GUIDE VASC ACCESS RIGHT  02/06/2018  . LOBECTOMY Right 09/02/2016   Procedure: RIGHT UPPER LOBECTOMY;  Surgeon: Melrose Nakayama, MD;  Location: [facility];  Service: Thoracic;  Laterality: Right;  . LYMPH NODE DISSECTION Right 09/02/2016   Procedure: LYMPH NODE DISSECTION, RIGHT LUNG;   Surgeon: Melrose Nakayama, MD;  Location: [facility];  Service: Thoracic;  Laterality: Right;  . MITRAL VALVE REPAIR N/A 03/24/2017   Procedure: MITRAL VALVE REPAIR (MVR) with Sorin Carbomedics Annuloflex size 28;  Surgeon: Rexene Alberts, MD;  Location: [facility];  Service: Open Heart Surgery;  Laterality: N/A;  . RIGHT/LEFT HEART CATH AND CORONARY ANGIOGRAPHY N/A 08/11/2016   Procedure: Right/Left Heart Cath and Coronary Angiography;  Surgeon: [name], MD;  Location: [facility];  Service: Cardiovascular;  Laterality: N/A;  . TEE WITHOUT CARDIOVERSION N/A 07/17/2016   Procedure: TRANSESOPHAGEAL ECHOCARDIOGRAM (TEE);  Surgeon: [name], MD;  Location: [facility];  Service: Cardiovascular;  Laterality: N/A;  . TEE WITHOUT CARDIOVERSION N/A 03/24/2017   Procedure: TRANSESOPHAGEAL ECHOCARDIOGRAM (TEE);  Surgeon: Rexene Alberts, MD;  Location: [facility];  Service: Open Heart Surgery;  Laterality: N/A;  . TONSILLECTOMY  1950's  . TOTAL HIP ARTHROPLASTY Left 10/05/2014   dr  alusio  . TOTAL HIP ARTHROPLASTY Left 10/05/2014   Procedure: LEFT TOTAL HIP ARTHROPLASTY ANTERIOR APPROACH;  Surgeon: [name], MD;  Location: [facility];  Service: Orthopedics;  Laterality: Left;  Marland Kitchen VIDEO ASSISTED THORACOSCOPY (VATS)/WEDGE RESECTION Right 09/02/2016   Procedure: VIDEO ASSISTED THORACOSCOPY (VATS)/RIGHT UPPER LOBE WEDGE RESECTION;  Surgeon: Melrose Nakayama, MD;  Location: [facility];  Service: Thoracic;  Laterality: Right;  Marland Kitchen VIDEO BRONCHOSCOPY WITH ENDOBRONCHIAL NAVIGATION N/A 10/19/2012   Procedure: VIDEO BRONCHOSCOPY WITH ENDOBRONCHIAL NAVIGATION;  Surgeon: [name], MD;  Location: [facility];  Service: Thoracic;  Laterality: N/A;  . WRIST RECONSTRUCTION Left 12/2009   'proximal row carpectomy" [name]    REVIEW OF SYSTEMS:  Constitutional: negative Eyes: negative Ears, nose, mouth, throat, and face: negative Respiratory: positive for dyspnea on exertion Cardiovascular:  negative Gastrointestinal: negative Genitourinary:negative Integument/breast: negative Hematologic/lymphatic: negative Musculoskeletal:negative Neurological: negative Behavioral/Psych: negative Endocrine: negative Allergic/Immunologic: negative   PHYSICAL EXAMINATION: General appearance: alert, cooperative and no distress Head: Normocephalic, without obvious abnormality, atraumatic Neck: no adenopathy, no JVD, supple, symmetrical, trachea midline and thyroid not enlarged, symmetric, no tenderness/mass/nodules Lymph nodes: Cervical, supraclavicular, and axillary nodes normal. Resp: clear to auscultation bilaterally Back: symmetric, no curvature. ROM normal. No CVA tenderness. Cardio: regular rate and rhythm, S1, S2 normal, no murmur, click, rub or gallop GI: soft, non-tender; bowel sounds normal; no masses,  no organomegaly Extremities: extremities normal, atraumatic, no cyanosis or edema Neurologic: Alert and oriented X 3, normal strength and tone. Normal symmetric reflexes. Normal coordination and gait  ECOG PERFORMANCE STATUS: 1 - Symptomatic but completely ambulatory  Blood pressure (!) 146/66, pulse 66, temperature 98.9 F (37.2 C), temperature source Tympanic, resp. rate 19, height _0  (1.753 m), weight 227 lb 9.6 oz (103.2 kg), SpO2 96 %.  LABORATORY DATA: Lab Results  Component Value Date   WBC 6.1 12/15/2019   HGB 15.2 12/15/2019   HCT 45.8 12/15/2019   MCV 94.2 12/15/2019   PLT 131 (L) 12/15/2019      Chemistry      Component Value Date/Time   NA 139 12/15/2019 0901   NA 142 10/01/2016 1534   K 4.3 12/15/2019 0901   K 4.2 10/01/2016 1534   CL 108 12/15/2019 0901   CO2 24 12/15/2019 0901   CO2 28 10/01/2016 1534   BUN 15 12/15/2019 0901   BUN 15.9 10/01/2016 1534   CREATININE 1.36 (H) 12/15/2019 0901   CREATININE 1.10 02/08/2017 1101   CREATININE 1.5 (H) 10/01/2016 1534      Component Value Date/Time   CALCIUM 9.4 12/15/2019 0901   CALCIUM 9.6  10/01/2016 1534   ALKPHOS 48 12/15/2019 0901   ALKPHOS 65 10/01/2016 1534   AST 29 12/15/2019 0901   AST 20 10/01/2016 1534   ALT 27 12/15/2019 0901   ALT 24 10/01/2016 1534   BILITOT 0.7 12/15/2019 0901   BILITOT 0.46 10/01/2016 1534       RADIOGRAPHIC STUDIES: CT Chest W Contrast  Result Date: 12/15/2019 CLINICAL DATA:  Primary Cancer Type: Lung Imaging Indication: Routine surveillance Interval therapy since last imaging? Yes Initial Cancer Diagnosis Date: 09/02/2016; established by: Biopsy-proven Detailed Pathology: Stage IA non-small cell lung cancer, adenocarcinoma. Primary Tumor location: Right upper lobe. Recurrence? Yes; Date(s) of recurrence: 10/25/2017; Established by: Biopsy-proven Surgeries: Right upper lobectomy 09/02/2016. Mitral valve repair 03/24/2017. Chemotherapy: Yes; Ongoing? Yes; Most recent administration: Tagrisso daily since 10/16/2017 Immunotherapy? No Radiation therapy? No EXAM: CT CHEST, ABDOMEN, AND PELVIS WITH CONTRAST TECHNIQUE: Multidetector CT imaging of the  chest, abdomen and pelvis was performed following the standard protocol during bolus administration of intravenous contrast. CONTRAST:  174m OMNIPAQUE IOHEXOL 300 MG/ML SOLN, additional oral enteric contrast COMPARISON:  Most recent CT chest, abdomen and pelvis 08/14/2019. 10/25/2017 PET-CT. FINDINGS: CT CHEST FINDINGS Cardiovascular: Aortic atherosclerosis. Normal heart size. Mitral valve prosthesis. Scattered three-vessel coronary artery calcifications no pericardial effusion. Mediastinum/Nodes: No enlarged mediastinal, hilar, or axillary lymph nodes. Thyroid gland, trachea, and esophagus demonstrate no significant findings. Lungs/Pleura: Redemonstrated postoperative findings of right upper lobectomy. Bandlike scarring of the bilateral lung bases. Chronic, loculated trace right pleural effusion. Stable, clustered tree-in-bud nodularity of the posteromedial left upper lobe (series 4, image 39). Stable 4 mm nodule  of the peripheral right lower lobe (series 4, image 65). There is a 0.9 x 0.4 cm fissural nodule of the medial right lower lobe about the minor fissure, which has developed over prior examinations (series 4, image 87). Additional tiny, calcified, benign nodules of the right lower lobe. Musculoskeletal: No chest wall mass or suspicious bone lesions identified. Disc degenerative disease and ankylosis of the thoracic spine. CT ABDOMEN PELVIS FINDINGS Hepatobiliary: No solid liver abnormality is seen. Multiple simple liver cysts. No gallstones, gallbladder wall thickening, or biliary dilatation. Pancreas: Unremarkable. No pancreatic ductal dilatation or surrounding inflammatory changes. Spleen: Normal in size without significant abnormality. Adrenals/Urinary Tract: Adrenal glands are unremarkable. Simple cyst of the inferior pole of the left kidney. Kidneys are otherwise normal, without renal calculi, solid lesion, or hydronephrosis. Bladder is unremarkable. Stomach/Bowel: Stomach is within normal limits. Appendix appears normal. No evidence of bowel wall thickening, distention, or inflammatory changes. Sigmoid diverticulosis. Vascular/Lymphatic: Aortic atherosclerosis. No enlarged abdominal or pelvic lymph nodes. Reproductive: Mild prostatomegaly.  Median lobe hypertrophy. Other: No abdominal wall hernia or abnormality. No abdominopelvic ascites. Musculoskeletal: No acute or significant osseous findings. IMPRESSION: 1. Redemonstrated postoperative findings of right upper lobectomy. 2. There is a 0.9 x 0.4 cm fissural nodule of the medial right lower lobe about the minor fissure, which has developed over prior examinations. This finding is nonspecific and may reflect a fissural lymph node, however a recurrent or metastatic nodule is not strictly excluded. At present this is likely too small to characterize by FDG PET. Continued attention on follow-up. 3. Additional stable 4 mm pulmonary nodule of the right lower lobe  and additional definitively benign calcified nodules. 4. No other findings in the chest, abdomen, or pelvis to suggest recurrent or metastatic disease. 5. Coronary artery disease.  Aortic Atherosclerosis (ICD10-I70.0). Electronically Signed   By: AEddie CandleM.D.   On: 12/15/2019 10:48   CT Abdomen Pelvis W Contrast  Result Date: 12/15/2019 CLINICAL DATA:  Primary Cancer Type: Lung Imaging Indication: Routine surveillance Interval therapy since last imaging? Yes Initial Cancer Diagnosis Date: 09/02/2016; established by: Biopsy-proven Detailed Pathology: Stage IA non-small cell lung cancer, adenocarcinoma. Primary Tumor location: Right upper lobe. Recurrence? Yes; Date(s) of recurrence: 10/25/2017; Established by: Biopsy-proven Surgeries: Right upper lobectomy 09/02/2016. Mitral valve repair 03/24/2017. Chemotherapy: Yes; Ongoing? Yes; Most recent administration: Tagrisso daily since 10/16/2017 Immunotherapy? No Radiation therapy? No EXAM: CT CHEST, ABDOMEN, AND PELVIS WITH CONTRAST TECHNIQUE: Multidetector CT imaging of the chest, abdomen and pelvis was performed following the standard protocol during bolus administration of intravenous contrast. CONTRAST:  1033mOMNIPAQUE IOHEXOL 300 MG/ML SOLN, additional oral enteric contrast COMPARISON:  Most recent CT chest, abdomen and pelvis 08/14/2019. 10/25/2017 PET-CT. FINDINGS: CT CHEST FINDINGS Cardiovascular: Aortic atherosclerosis. Normal heart size. Mitral valve prosthesis. Scattered three-vessel coronary artery calcifications no pericardial  effusion. Mediastinum/Nodes: No enlarged mediastinal, hilar, or axillary lymph nodes. Thyroid gland, trachea, and esophagus demonstrate no significant findings. Lungs/Pleura: Redemonstrated postoperative findings of right upper lobectomy. Bandlike scarring of the bilateral lung bases. Chronic, loculated trace right pleural effusion. Stable, clustered tree-in-bud nodularity of the posteromedial left upper lobe (series 4, image  39). Stable 4 mm nodule of the peripheral right lower lobe (series 4, image 65). There is a 0.9 x 0.4 cm fissural nodule of the medial right lower lobe about the minor fissure, which has developed over prior examinations (series 4, image 87). Additional tiny, calcified, benign nodules of the right lower lobe. Musculoskeletal: No chest wall mass or suspicious bone lesions identified. Disc degenerative disease and ankylosis of the thoracic spine. CT ABDOMEN PELVIS FINDINGS Hepatobiliary: No solid liver abnormality is seen. Multiple simple liver cysts. No gallstones, gallbladder wall thickening, or biliary dilatation. Pancreas: Unremarkable. No pancreatic ductal dilatation or surrounding inflammatory changes. Spleen: Normal in size without significant abnormality. Adrenals/Urinary Tract: Adrenal glands are unremarkable. Simple cyst of the inferior pole of the left kidney. Kidneys are otherwise normal, without renal calculi, solid lesion, or hydronephrosis. Bladder is unremarkable. Stomach/Bowel: Stomach is within normal limits. Appendix appears normal. No evidence of bowel wall thickening, distention, or inflammatory changes. Sigmoid diverticulosis. Vascular/Lymphatic: Aortic atherosclerosis. No enlarged abdominal or pelvic lymph nodes. Reproductive: Mild prostatomegaly.  Median lobe hypertrophy. Other: No abdominal wall hernia or abnormality. No abdominopelvic ascites. Musculoskeletal: No acute or significant osseous findings. IMPRESSION: 1. Redemonstrated postoperative findings of right upper lobectomy. 2. There is a 0.9 x 0.4 cm fissural nodule of the medial right lower lobe about the minor fissure, which has developed over prior examinations. This finding is nonspecific and may reflect a fissural lymph node, however a recurrent or metastatic nodule is not strictly excluded. At present this is likely too small to characterize by FDG PET. Continued attention on follow-up. 3. Additional stable 4 mm pulmonary nodule of  the right lower lobe and additional definitively benign calcified nodules. 4. No other findings in the chest, abdomen, or pelvis to suggest recurrent or metastatic disease. 5. Coronary artery disease.  Aortic Atherosclerosis (ICD10-I70.0). Electronically Signed   By: Eddie Candle M.D.   On: 12/15/2019 10:48    ASSESSMENT AND PLAN: This is a very pleasant 69 years old white male with a stage Ia non-small cell lung cancer status post right upper lobectomy with lymph node dissection on September 02, 2016. The patient he was on observation since that time. Unfortunately there are some concerning findings with nodular density in the posterior right lower lobe as well as increase and right pleural effusion suspicious for disease recurrence. He underwent ultrasound-guided right thoracentesis and the cytology of the pleural fluid was consistent with recurrent adenocarcinoma. He had molecular studies performed by [facility] 1 that showed positive EGFR mutation with deletion 44. The patient was started on treatment with Tagrisso 80 mg p.o. daily on 11/05/2017.  He status post 25 months of treatment. The patient has been tolerating this treatment fairly well with no concerning adverse effects. He had repeat CT scan of the chest, abdomen pelvis performed recently.  I personally and independently reviewed the scans and discussed the results with the patient and his wife today. He has a scan showed no concerning findings for disease progression except for development of a fissural nodule of the medial right lower lobe about the minor fissure. I recommended for the patient to continue his current treatment with Tagrisso with the same dose. I will  see him back for follow-up visit in 2 months for evaluation and repeat blood work. We will continue to monitor this fissural nodule closely on the upcoming imaging studies. The patient was advised to call immediately if he has any concerning symptoms in the interval. The  patient voices understanding of current disease status and treatment options and is in agreement with the current care plan. All questions were answered. The patient knows to call the clinic with any problems, questions or concerns. We can certainly see the patient much sooner if necessary.  Disclaimer: This note was dictated with voice recognition software. Similar sounding words can inadvertently be transcribed and may not be corrected upon review.

## 2019-12-25 ENCOUNTER — Telehealth: Payer: Self-pay | Admitting: Internal Medicine

## 2019-12-25 NOTE — Telephone Encounter (Signed)
Scheduled per 8/9 los. Called and spoke with patient. Confirmed appt

## 2019-12-26 ENCOUNTER — Other Ambulatory Visit: Payer: Self-pay

## 2019-12-26 ENCOUNTER — Encounter: Payer: Self-pay | Admitting: Internal Medicine

## 2019-12-26 ENCOUNTER — Ambulatory Visit (INDEPENDENT_AMBULATORY_CARE_PROVIDER_SITE_OTHER): Payer: Medicare PPO | Admitting: Internal Medicine

## 2019-12-26 VITALS — BP 126/68 | HR 57 | Temp 98.7°F | Resp 16 | Ht 69.0 in | Wt 231.2 lb

## 2019-12-26 DIAGNOSIS — N138 Other obstructive and reflux uropathy: Secondary | ICD-10-CM

## 2019-12-26 DIAGNOSIS — Z0000 Encounter for general adult medical examination without abnormal findings: Secondary | ICD-10-CM

## 2019-12-26 DIAGNOSIS — N1831 Chronic kidney disease, stage 3a: Secondary | ICD-10-CM | POA: Diagnosis not present

## 2019-12-26 DIAGNOSIS — E785 Hyperlipidemia, unspecified: Secondary | ICD-10-CM

## 2019-12-26 DIAGNOSIS — E118 Type 2 diabetes mellitus with unspecified complications: Secondary | ICD-10-CM

## 2019-12-26 DIAGNOSIS — N401 Enlarged prostate with lower urinary tract symptoms: Secondary | ICD-10-CM | POA: Diagnosis not present

## 2019-12-26 DIAGNOSIS — D229 Melanocytic nevi, unspecified: Secondary | ICD-10-CM

## 2019-12-26 DIAGNOSIS — N522 Drug-induced erectile dysfunction: Secondary | ICD-10-CM | POA: Diagnosis not present

## 2019-12-26 MED ORDER — SILDENAFIL CITRATE 20 MG PO TABS: 80.0000 mg | ORAL_TABLET | Freq: Every day | ORAL | 5 refills | Status: DC | PRN

## 2019-12-26 NOTE — Progress Notes (Signed)
Subjective:  Patient ID: James [name], male    DOB: [birth year]  Age: 69 y.o. MRN: [id number]  CC: Annual Exam, Diabetes, and Hyperlipidemia  This visit occurred during the SARS-CoV-2 public health emergency.  Safety protocols were in place, including screening questions prior to the visit, additional usage of staff PPE, and extensive cleaning of exam room while observing appropriate contact time as indicated for disinfecting solutions.    HPI James [name] presents for a CPX.  He is undergoing chemotherapy for the treatment of lung cancer.  He has developed thrombocytopenia.  He has mild, intermittent bleeding gums but no bleeding or bruising elsewhere.  He has a chronic, unchanged degree of shortness of breath.  He denies chest pain, diaphoresis, palpitations, dizziness, or lightheadedness.  He struggles with chronic musculoskeletal pain.  Outpatient Medications Prior to Visit  Medication Sig Dispense Refill  . ezetimibe (ZETIA) 10 MG tablet Take 1 tablet (10 mg total) by mouth daily. 90 tablet 3  . furosemide (LASIX) 20 MG tablet Take 0.5 tablets (10 mg total) by mouth daily. Take 1/2 tablet 10 mg daily 45 tablet 3  . metoprolol tartrate (LOPRESSOR) 25 MG tablet Take 0.5 tablets (12.5 mg total) by mouth 2 (two) times daily. 90 tablet 3  . Omega-3 Fatty Acids (FISH OIL) 500 MG CAPS Take 500 mg by mouth daily.     . rivaroxaban (XARELTO) 20 MG TABS tablet Take 1 tablet (20 mg total) by mouth daily with supper. 90 tablet 3  . TAGRISSO 80 MG tablet TAKE 1 TABLET (80 MG TOTAL) BY MOUTH DAILY. 30 tablet 2  . sildenafil (REVATIO) 20 MG tablet Take 4 tablets (80 mg total) by mouth daily as needed. 60 tablet 5   No facility-administered medications prior to visit.    ROS Review of Systems  Constitutional: Negative for appetite change, chills, diaphoresis, fatigue and fever.  HENT: Negative.   Eyes: Negative for visual disturbance.  Respiratory: Positive for shortness of breath.  Negative for cough, chest tightness and wheezing.   Cardiovascular: Positive for leg swelling. Negative for chest pain and palpitations.  Gastrointestinal: Negative for abdominal pain, blood in stool, constipation, diarrhea, nausea and vomiting.  Endocrine: Negative.   Genitourinary: Negative.  Negative for difficulty urinating, dysuria, frequency, hematuria, testicular pain and urgency.       ++ED  Musculoskeletal: Positive for arthralgias. Negative for myalgias and neck pain.  Skin: Negative.  Negative for color change, pallor and rash.       He is concerned about a changing mole on his left upper back.  Neurological: Negative.  Negative for dizziness, weakness, light-headedness, numbness and headaches.  Hematological: Negative for adenopathy. Does not bruise/bleed easily.  Psychiatric/Behavioral: Negative.     Objective:  BP 126/68 (BP Location: Left Arm, Patient Position: Sitting, Cuff Size: Large)   Pulse (!) 57   Temp 98.7 F (37.1 C) (Oral)   Resp 16   Ht 5\' 9"  (1.753 m)   Wt 231 lb 4 oz (104.9 kg)   SpO2 96%   BMI 34.15 kg/m   BP Readings from Last 3 Encounters:  12/26/19 126/68  12/18/19 (!) 146/66  10/20/19 (!) 142/78    Wt Readings from Last 3 Encounters:  12/26/19 231 lb 4 oz (104.9 kg)  12/18/19 227 lb 9.6 oz (103.2 kg)  10/20/19 235 lb 3.2 oz (106.7 kg)    Physical Exam Vitals reviewed.  Constitutional:      General: He is not in acute distress.  Appearance: He is obese. He is not toxic-appearing or diaphoretic.  HENT:     Nose: Nose normal.     Mouth/Throat:     Mouth: Mucous membranes are moist.  Eyes:     General: No scleral icterus.    Conjunctiva/sclera: Conjunctivae normal.  Cardiovascular:     Rate and Rhythm: Normal rate and regular rhythm.     Heart sounds: Murmur heard.  Systolic murmur is present with a grade of 2/6.  No diastolic murmur is present.  No gallop.   Abdominal:     General: Abdomen is protuberant. Bowel sounds are  normal. There is no distension.     Palpations: There is no hepatomegaly, splenomegaly or mass.     Tenderness: There is no guarding or rebound.     Hernia: A hernia is present. Hernia is present in the ventral area. There is no hernia in the left inguinal area or right inguinal area.  Genitourinary:    Pubic Area: No rash.      Penis: Normal and circumcised.      Testes: Normal.        Right: Mass or tenderness not present.        Left: Mass or tenderness not present.     Epididymis:     Right: Normal.     Left: Normal.     Prostate: Enlarged (1+ smooth symm BPH). Not tender and no nodules present.     Rectum: Guaiac result negative. External hemorrhoid and internal hemorrhoid present. No mass, tenderness or anal fissure. Normal anal tone.  Musculoskeletal:     Cervical back: Neck supple.     Right lower leg: 1+ Edema present.     Left lower leg: 1+ Edema present.  Lymphadenopathy:     Cervical: No cervical adenopathy.     Lower Body: No right inguinal adenopathy. No left inguinal adenopathy.  Skin:    General: Skin is warm and dry.  Neurological:     General: No focal deficit present.     Mental Status: He is alert and oriented to person, place, and time. Mental status is at baseline.     Lab Results  Component Value Date   WBC 6.1 12/15/2019   HGB 15.2 12/15/2019   HCT 45.8 12/15/2019   PLT 131 (L) 12/15/2019   GLUCOSE 121 (H) 12/15/2019   CHOL 207 (H) 12/26/2019   TRIG 117 12/26/2019   HDL 49 12/26/2019   LDLDIRECT 177.0 07/24/2015   LDLCALC 136 (H) 12/26/2019   ALT 27 12/15/2019   AST 29 12/15/2019   NA 139 12/15/2019   K 4.3 12/15/2019   CL 108 12/15/2019   CREATININE 1.36 (H) 12/15/2019   BUN 15 12/15/2019   CO2 24 12/15/2019   TSH 2.18 12/26/2019   PSA 1.1 12/26/2019   INR 1.28 02/07/2018   HGBA1C 6.0 (H) 12/26/2019   MICROALBUR 0.8 12/26/2019    CT Chest W Contrast  Result Date: 12/15/2019 CLINICAL DATA:  Primary Cancer Type: Lung Imaging Indication:  Routine surveillance Interval therapy since last imaging? Yes Initial Cancer Diagnosis Date: 09/02/2016; established by: Biopsy-proven Detailed Pathology: Stage IA non-small cell lung cancer, adenocarcinoma. Primary Tumor location: Right upper lobe. Recurrence? Yes; Date(s) of recurrence: 10/25/2017; Established by: Biopsy-proven Surgeries: Right upper lobectomy 09/02/2016. Mitral valve repair 03/24/2017. Chemotherapy: Yes; Ongoing? Yes; Most recent administration: Tagrisso daily since 10/16/2017 Immunotherapy? No Radiation therapy? No EXAM: CT CHEST, ABDOMEN, AND PELVIS WITH CONTRAST TECHNIQUE: Multidetector CT imaging of the chest, abdomen and  pelvis was performed following the standard protocol during bolus administration of intravenous contrast. CONTRAST:  16mL OMNIPAQUE IOHEXOL 300 MG/ML SOLN, additional oral enteric contrast COMPARISON:  Most recent CT chest, abdomen and pelvis 08/14/2019. 10/25/2017 PET-CT. FINDINGS: CT CHEST FINDINGS Cardiovascular: Aortic atherosclerosis. Normal heart size. Mitral valve prosthesis. Scattered three-vessel coronary artery calcifications no pericardial effusion. Mediastinum/Nodes: No enlarged mediastinal, hilar, or axillary lymph nodes. Thyroid gland, trachea, and esophagus demonstrate no significant findings. Lungs/Pleura: Redemonstrated postoperative findings of right upper lobectomy. Bandlike scarring of the bilateral lung bases. Chronic, loculated trace right pleural effusion. Stable, clustered tree-in-bud nodularity of the posteromedial left upper lobe (series 4, image 39). Stable 4 mm nodule of the peripheral right lower lobe (series 4, image 65). There is a 0.9 x 0.4 cm fissural nodule of the medial right lower lobe about the minor fissure, which has developed over prior examinations (series 4, image 87). Additional tiny, calcified, benign nodules of the right lower lobe. Musculoskeletal: No chest wall mass or suspicious bone lesions identified. Disc degenerative  disease and ankylosis of the thoracic spine. CT ABDOMEN PELVIS FINDINGS Hepatobiliary: No solid liver abnormality is seen. Multiple simple liver cysts. No gallstones, gallbladder wall thickening, or biliary dilatation. Pancreas: Unremarkable. No pancreatic ductal dilatation or surrounding inflammatory changes. Spleen: Normal in size without significant abnormality. Adrenals/Urinary Tract: Adrenal glands are unremarkable. Simple cyst of the inferior pole of the left kidney. Kidneys are otherwise normal, without renal calculi, solid lesion, or hydronephrosis. Bladder is unremarkable. Stomach/Bowel: Stomach is within normal limits. Appendix appears normal. No evidence of bowel wall thickening, distention, or inflammatory changes. Sigmoid diverticulosis. Vascular/Lymphatic: Aortic atherosclerosis. No enlarged abdominal or pelvic lymph nodes. Reproductive: Mild prostatomegaly.  Median lobe hypertrophy. Other: No abdominal wall hernia or abnormality. No abdominopelvic ascites. Musculoskeletal: No acute or significant osseous findings. IMPRESSION: 1. Redemonstrated postoperative findings of right upper lobectomy. 2. There is a 0.9 x 0.4 cm fissural nodule of the medial right lower lobe about the minor fissure, which has developed over prior examinations. This finding is nonspecific and may reflect a fissural lymph node, however a recurrent or metastatic nodule is not strictly excluded. At present this is likely too small to characterize by FDG PET. Continued attention on follow-up. 3. Additional stable 4 mm pulmonary nodule of the right lower lobe and additional definitively benign calcified nodules. 4. No other findings in the chest, abdomen, or pelvis to suggest recurrent or metastatic disease. 5. Coronary artery disease.  Aortic Atherosclerosis (ICD10-I70.0). Electronically Signed   By: Eddie Candle M.D.   On: 12/15/2019 10:48   CT Abdomen Pelvis W Contrast  Result Date: 12/15/2019 CLINICAL DATA:  Primary Cancer  Type: Lung Imaging Indication: Routine surveillance Interval therapy since last imaging? Yes Initial Cancer Diagnosis Date: 09/02/2016; established by: Biopsy-proven Detailed Pathology: Stage IA non-small cell lung cancer, adenocarcinoma. Primary Tumor location: Right upper lobe. Recurrence? Yes; Date(s) of recurrence: 10/25/2017; Established by: Biopsy-proven Surgeries: Right upper lobectomy 09/02/2016. Mitral valve repair 03/24/2017. Chemotherapy: Yes; Ongoing? Yes; Most recent administration: Tagrisso daily since 10/16/2017 Immunotherapy? No Radiation therapy? No EXAM: CT CHEST, ABDOMEN, AND PELVIS WITH CONTRAST TECHNIQUE: Multidetector CT imaging of the chest, abdomen and pelvis was performed following the standard protocol during bolus administration of intravenous contrast. CONTRAST:  191mL OMNIPAQUE IOHEXOL 300 MG/ML SOLN, additional oral enteric contrast COMPARISON:  Most recent CT chest, abdomen and pelvis 08/14/2019. 10/25/2017 PET-CT. FINDINGS: CT CHEST FINDINGS Cardiovascular: Aortic atherosclerosis. Normal heart size. Mitral valve prosthesis. Scattered three-vessel coronary artery calcifications no pericardial effusion. Mediastinum/Nodes: No  enlarged mediastinal, hilar, or axillary lymph nodes. Thyroid gland, trachea, and esophagus demonstrate no significant findings. Lungs/Pleura: Redemonstrated postoperative findings of right upper lobectomy. Bandlike scarring of the bilateral lung bases. Chronic, loculated trace right pleural effusion. Stable, clustered tree-in-bud nodularity of the posteromedial left upper lobe (series 4, image 39). Stable 4 mm nodule of the peripheral right lower lobe (series 4, image 65). There is a 0.9 x 0.4 cm fissural nodule of the medial right lower lobe about the minor fissure, which has developed over prior examinations (series 4, image 87). Additional tiny, calcified, benign nodules of the right lower lobe. Musculoskeletal: No chest wall mass or suspicious bone lesions  identified. Disc degenerative disease and ankylosis of the thoracic spine. CT ABDOMEN PELVIS FINDINGS Hepatobiliary: No solid liver abnormality is seen. Multiple simple liver cysts. No gallstones, gallbladder wall thickening, or biliary dilatation. Pancreas: Unremarkable. No pancreatic ductal dilatation or surrounding inflammatory changes. Spleen: Normal in size without significant abnormality. Adrenals/Urinary Tract: Adrenal glands are unremarkable. Simple cyst of the inferior pole of the left kidney. Kidneys are otherwise normal, without renal calculi, solid lesion, or hydronephrosis. Bladder is unremarkable. Stomach/Bowel: Stomach is within normal limits. Appendix appears normal. No evidence of bowel wall thickening, distention, or inflammatory changes. Sigmoid diverticulosis. Vascular/Lymphatic: Aortic atherosclerosis. No enlarged abdominal or pelvic lymph nodes. Reproductive: Mild prostatomegaly.  Median lobe hypertrophy. Other: No abdominal wall hernia or abnormality. No abdominopelvic ascites. Musculoskeletal: No acute or significant osseous findings. IMPRESSION: 1. Redemonstrated postoperative findings of right upper lobectomy. 2. There is a 0.9 x 0.4 cm fissural nodule of the medial right lower lobe about the minor fissure, which has developed over prior examinations. This finding is nonspecific and may reflect a fissural lymph node, however a recurrent or metastatic nodule is not strictly excluded. At present this is likely too small to characterize by FDG PET. Continued attention on follow-up. 3. Additional stable 4 mm pulmonary nodule of the right lower lobe and additional definitively benign calcified nodules. 4. No other findings in the chest, abdomen, or pelvis to suggest recurrent or metastatic disease. 5. Coronary artery disease.  Aortic Atherosclerosis (ICD10-I70.0). Electronically Signed   By: Eddie Candle M.D.   On: 12/15/2019 10:48    Assessment & Plan:   James [name] was seen today for annual  exam, diabetes and hyperlipidemia.  Diagnoses and all orders for this visit:  Type II diabetes mellitus with manifestations ([place])- His A1c is at 6.0%.  His blood sugars are adequately well controlled.  Medical therapy is not indicated. -     Hemoglobin A1c; Future -     Microalbumin / creatinine urine ratio; Future -     HM Diabetes Foot Exam -     Microalbumin / creatinine urine ratio -     Hemoglobin A1c  Stage 3a chronic kidney disease- His renal function is stable.  He will avoid nephrotoxic agents. -     Urinalysis, Routine w reflex microscopic; Future -     Urinalysis, Routine w reflex microscopic  Hyperlipidemia with target LDL less than 130- He has not achieved his LDL goal and is not willing to take a statin. -     Lipid panel; Future -     TSH; Future -     TSH -     Lipid panel  Routine general medical examination at a health care facility- Exam completed, labs reviewed, vaccines reviewed and updated, patient education material was given.  BPH with obstruction/lower urinary tract symptoms- His PSA is normal which  is a reassuring sign that he does not have prostate cancer.  He has no symptoms that need to be treated. -     PSA; Future -     Urinalysis, Routine w reflex microscopic; Future -     Urinalysis, Routine w reflex microscopic -     PSA  Atypical mole -     Ambulatory referral to Dermatology  Drug-induced erectile dysfunction -     sildenafil (REVATIO) 20 MG tablet; Take 4 tablets (80 mg total) by mouth daily as needed.   I am having James A. Gerbino "[name]" maintain his Fish Oil, Tagrisso, rivaroxaban, metoprolol tartrate, ezetimibe, furosemide, and sildenafil.  Meds ordered this encounter  Medications  . sildenafil (REVATIO) 20 MG tablet    Sig: Take 4 tablets (80 mg total) by mouth daily as needed.    Dispense:  60 tablet    Refill:  5   In addition to time spent on CPE, I spent 50 minutes in preparing to see the patient by review of recent labs, imaging  and procedures, obtaining and reviewing separately obtained history, communicating with the patient and family or caregiver, ordering medications, tests or procedures, and documenting clinical information in the EHR including the differential Dx, treatment, and any further evaluation and other management of 1. Type II diabetes mellitus with manifestations (H[facility]) 2. Stage 3a chronic kidney disease 3. Hyperlipidemia with target LDL less than 130 4. BPH with obstruction/lower urinary tract symptoms 5. Atypical mole 6. Drug-induced erectile dysfunction     Follow-up: Return in about 6 months (around 06/27/2020).  [name], MD

## 2019-12-26 NOTE — Patient Instructions (Signed)

## 2019-12-27 LAB — MICROALBUMIN / CREATININE URINE RATIO
Creatinine, Urine: 126 mg/dL (ref 20–320)
Microalb Creat Ratio: 6 mcg/mg creat (ref <30)
Microalb, Ur: 0.8 mg/dL

## 2019-12-27 LAB — LIPID PANEL
Cholesterol: 207 mg/dL — ABNORMAL HIGH (ref <200)
HDL: 49 mg/dL (ref >=40)
LDL Cholesterol (Calc): 136 mg/dL (calc) — ABNORMAL HIGH
Non-HDL Cholesterol (Calc): 158 mg/dL (calc) — ABNORMAL HIGH (ref <130)
Total CHOL/HDL Ratio: 4.2 (calc) (ref <5.0)
Triglycerides: 117 mg/dL (ref <150)

## 2019-12-27 LAB — URINALYSIS, ROUTINE W REFLEX MICROSCOPIC
Bilirubin Urine: NEGATIVE
Glucose, UA: NEGATIVE
Hgb urine dipstick: NEGATIVE
Ketones, ur: NEGATIVE
Leukocytes,Ua: NEGATIVE
Nitrite: NEGATIVE
Protein, ur: NEGATIVE
Specific Gravity, Urine: 1.021 (ref 1.001–1.03)
pH: <=5 (ref 5.0–8.0)

## 2019-12-27 LAB — TSH: TSH: 2.18 mIU/L (ref 0.40–4.50)

## 2019-12-27 LAB — HEMOGLOBIN A1C
Hgb A1c MFr Bld: 6 % of total Hgb — ABNORMAL HIGH (ref <5.7)
Mean Plasma Glucose: 126 (calc)
eAG (mmol/L): 7 (calc)

## 2019-12-27 LAB — PSA: PSA: 1.1 ng/mL (ref <=4.0)

## 2019-12-28 ENCOUNTER — Telehealth: Payer: Self-pay

## 2019-12-28 NOTE — Telephone Encounter (Signed)
KEY: [id number]

## 2020-01-11 ENCOUNTER — Other Ambulatory Visit: Payer: Self-pay | Admitting: Internal Medicine

## 2020-01-11 DIAGNOSIS — C3491 Malignant neoplasm of unspecified part of right bronchus or lung: Secondary | ICD-10-CM

## 2020-01-17 MED FILL — TAGRISSO 80 MG TABLET: 80 | 30 days supply | Qty: 30 | Fill #0

## 2020-01-25 DIAGNOSIS — G4733 Obstructive sleep apnea (adult) (pediatric): Secondary | ICD-10-CM | POA: Diagnosis not present

## 2020-02-14 MED FILL — TAGRISSO 80 MG TABLET: 80 | 30 days supply | Qty: 30 | Fill #1

## 2020-02-15 ENCOUNTER — Other Ambulatory Visit: Payer: Self-pay

## 2020-02-15 ENCOUNTER — Encounter: Payer: Self-pay | Admitting: Internal Medicine

## 2020-02-15 ENCOUNTER — Inpatient Hospital Stay: Payer: Medicare PPO | Attending: Internal Medicine | Admitting: Internal Medicine

## 2020-02-15 ENCOUNTER — Inpatient Hospital Stay: Payer: Medicare PPO

## 2020-02-15 VITALS — BP 135/68 | HR 68 | Temp 98.6°F | Resp 18 | Ht 69.0 in | Wt 233.8 lb

## 2020-02-15 DIAGNOSIS — C3411 Malignant neoplasm of upper lobe, right bronchus or lung: Secondary | ICD-10-CM

## 2020-02-15 DIAGNOSIS — R197 Diarrhea, unspecified: Secondary | ICD-10-CM | POA: Insufficient documentation

## 2020-02-15 DIAGNOSIS — J910 Malignant pleural effusion: Secondary | ICD-10-CM

## 2020-02-15 DIAGNOSIS — Z96652 Presence of left artificial knee joint: Secondary | ICD-10-CM | POA: Diagnosis not present

## 2020-02-15 DIAGNOSIS — C3491 Malignant neoplasm of unspecified part of right bronchus or lung: Secondary | ICD-10-CM

## 2020-02-15 DIAGNOSIS — Z79811 Long term (current) use of aromatase inhibitors: Secondary | ICD-10-CM | POA: Diagnosis not present

## 2020-02-15 DIAGNOSIS — Z5111 Encounter for antineoplastic chemotherapy: Secondary | ICD-10-CM

## 2020-02-15 DIAGNOSIS — Z9049 Acquired absence of other specified parts of digestive tract: Secondary | ICD-10-CM | POA: Diagnosis not present

## 2020-02-15 DIAGNOSIS — R918 Other nonspecific abnormal finding of lung field: Secondary | ICD-10-CM | POA: Diagnosis not present

## 2020-02-15 DIAGNOSIS — C3490 Malignant neoplasm of unspecified part of unspecified bronchus or lung: Secondary | ICD-10-CM | POA: Diagnosis not present

## 2020-02-15 DIAGNOSIS — K5900 Constipation, unspecified: Secondary | ICD-10-CM | POA: Diagnosis not present

## 2020-02-15 LAB — CBC WITH DIFFERENTIAL (CANCER CENTER ONLY)
Abs Immature Granulocytes: 0.02 10*3/uL (ref 0.00–0.07)
Basophils Absolute: 0 10*3/uL (ref 0.0–0.1)
Basophils Relative: 1 %
Eosinophils Absolute: 0.2 10*3/uL (ref 0.0–0.5)
Eosinophils Relative: 2 %
HCT: 45.9 % (ref 39.0–52.0)
Hemoglobin: 15.2 g/dL (ref 13.0–17.0)
Immature Granulocytes: 0 %
Lymphocytes Relative: 35 %
Lymphs Abs: 2.5 10*3/uL (ref 0.7–4.0)
MCH: 30.8 pg (ref 26.0–34.0)
MCHC: 33.1 g/dL (ref 30.0–36.0)
MCV: 93.1 fL (ref 80.0–100.0)
Monocytes Absolute: 0.8 10*3/uL (ref 0.1–1.0)
Monocytes Relative: 11 %
Neutro Abs: 3.6 10*3/uL (ref 1.7–7.7)
Neutrophils Relative %: 51 %
Platelet Count: 151 10*3/uL (ref 150–400)
RBC: 4.93 MIL/uL (ref 4.22–5.81)
RDW: 14.3 % (ref 11.5–15.5)
WBC Count: 7.1 10*3/uL (ref 4.0–10.5)
nRBC: 0 % (ref 0.0–0.2)

## 2020-02-15 LAB — CMP (CANCER CENTER ONLY)
ALT: 37 U/L (ref 0–44)
AST: 34 U/L (ref 15–41)
Albumin: 3.6 g/dL (ref 3.5–5.0)
Alkaline Phosphatase: 60 U/L (ref 38–126)
Anion gap: 5 (ref 5–15)
BUN: 19 mg/dL (ref 8–23)
CO2: 27 mmol/L (ref 22–32)
Calcium: 9.4 mg/dL (ref 8.9–10.3)
Chloride: 109 mmol/L (ref 98–111)
Creatinine: 1.39 mg/dL — ABNORMAL HIGH (ref 0.61–1.24)
GFR, Estimated: 51 mL/min — ABNORMAL LOW (ref >60)
Glucose, Bld: 129 mg/dL — ABNORMAL HIGH (ref 70–99)
Potassium: 4.8 mmol/L (ref 3.5–5.1)
Sodium: 141 mmol/L (ref 135–145)
Total Bilirubin: 0.5 mg/dL (ref 0.3–1.2)
Total Protein: 6.9 g/dL (ref 6.5–8.1)

## 2020-02-15 NOTE — Progress Notes (Signed)
[place] [phone]   [phone]  OFFICE PROGRESS NOTE  [name], MD [place] [address]  DIAGNOSIS: Recurrent non-small cell lung cancer, adenocarcinoma initially diagnosed as stage IA (T1c, N0, M0) non-small cell lung cancer, adenocarcinoma presented with right upper lobe lung nodule in April 2018 with recurrence in June 2019.  Biomarker Findings Microsatellite status - MS-Stable Tumor Mutational Burden - TMB-Low (3 Muts/Mb) Genomic Findings For a complete list of the genes assayed, please refer to the Appendix. EGFR exon 19 deletion ([id number]>X) CDKN2A/B loss NKX2-1 amplification 7 Disease relevant genes with no reportable alterations: KRAS, ALK, BRAF, MET, RET, ERBB2, ROS1   PRIOR THERAPY: Status post right upper lobectomy with lymph node dissection under the care of Dr. [name] on 09/02/2016.  CURRENT THERAPY: Tagrisso 80 mg p.o. daily.  First dose started November 05, 2017.  Status post 27 months of treatment.  INTERVAL HISTORY: James [name] 69 y.o. male returns to the clinic today for follow-up visit accompanied by his wife.  The patient is feeling fine today with no concerning complaints except for alternating diarrhea and constipation.  He continues to tolerate his treatment with Tagrisso fairly well.  He denied having any chest pain, shortness of breath, cough or hemoptysis.  He denied having any fever or chills.  He has no nausea or vomiting.  He denied having any significant weight loss or night sweats.  He is here today for evaluation and repeat blood work.  MEDICAL HISTORY: Past Medical History:  Diagnosis Date  . Adenocarcinoma of right lung, stage 1 ([place]) 09/06/2016  . Anxiety   . Arthritis    "knees, hips, back" (10/19/2012)  . Chronic diastolic congestive heart failure ([place])   . Chronic lower back pain   . Colon polyps    10/27/2002, repeat letter 09/17/2007  . Coronary artery disease   .  Coronary artery disease involving native coronary artery of native heart without angina pectoris   . Depressive disorder, not elsewhere classified    no meds  . Diabetes mellitus without complication ([place])    diet controlled- no med  (while in hosp 4/18 -elevated cbg  . Dyspnea   . Fasting hyperglycemia   . GERD (gastroesophageal reflux disease)   . Heart murmur   . Hemoptysis    abnormal CT Chest 01/29/10 - ? new GG changes RUL > not viz on plain cxr 02/26/2010  . Hypertension   . Mitral regurgitation    severe MR 08/2016  . MPN (myeloproliferative neoplasm) ([place])    1st detected 06/04/1998  . Obesity   . OSA on CPAP    last sleep study 10 years ago  . Other and unspecified hyperlipidemia   . Peripheral vascular disease ([facility]) 08/2016   after lung surgey small clots in lungs,after hip dvt-5/16  . Pneumonia    4/18  . Positive PPD 1965   "non reactive in 2012" (10/19/2012)  . Routine general medical examination at a health care facility   . S/P CABG x 1 03/24/2017   LIMA to LAD  . S/P mitral valve repair 03/24/2017   Complex valvuloplasty including artificial Gore-tex neochord placement x6 and 28 mm Sorin Annuloflex posterior annuloplasty band  . Special screening for malignant neoplasm of prostate   . Spinal stenosis, unspecified region other than cervical   . Wrist pain, left     ALLERGIES:  is allergic to symbicort [budesonide-formoterol fumarate] and amoxicillin.  MEDICATIONS:  Current Outpatient Medications  Medication Sig Dispense Refill  . ezetimibe (ZETIA) 10 MG tablet Take 1 tablet (10 mg total) by mouth daily. 90 tablet 3  . furosemide (LASIX) 20 MG tablet Take 0.5 tablets (10 mg total) by mouth daily. Take 1/2 tablet 10 mg daily 45 tablet 3  . metoprolol tartrate (LOPRESSOR) 25 MG tablet Take 0.5 tablets (12.5 mg total) by mouth 2 (two) times daily. 90 tablet 3  . Omega-3 Fatty Acids (FISH OIL) 500 MG CAPS Take 500 mg by mouth daily.     . rivaroxaban (XARELTO) 20  MG TABS tablet Take 1 tablet (20 mg total) by mouth daily with supper. 90 tablet 3  . sildenafil (REVATIO) 20 MG tablet Take 4 tablets (80 mg total) by mouth daily as needed. 60 tablet 5  . TAGRISSO 80 MG tablet TAKE 1 TABLET (80 MG TOTAL) BY MOUTH DAILY. 30 tablet 2   No current facility-administered medications for this visit.    SURGICAL HISTORY:  Past Surgical History:  Procedure Laterality Date  . ANTERIOR CRUCIATE LIGAMENT REPAIR Left 1967  . CARDIAC CATHETERIZATION  2000  . CHEST TUBE INSERTION Right 10/19/2012   post bronch  . COLONOSCOPY W/ POLYPECTOMY    . CORONARY ARTERY BYPASS GRAFT N/A 03/24/2017   Procedure: CORONARY ARTERY BYPASS GRAFTING (CABG)x1 using left internal mammary artery, LIMA-LAD;  Surgeon: Rexene Alberts, MD;  Location: [facility];  Service: Open Heart Surgery;  Laterality: N/A;  . FLEXIBLE BRONCHOSCOPY  10/19/2012   Flexible video fiberoptic bronchoscopy with electromagnetic navigation and biopsies.  . INTRAVASCULAR PRESSURE WIRE/FFR STUDY N/A 08/11/2016   Procedure: Intravascular Pressure Wire/FFR Study;  Surgeon: [name], MD;  Location: [facility];  Service: Cardiovascular;  Laterality: N/A;  . IR ANGIOGRAM PULMONARY BILATERAL SELECTIVE  02/06/2018  . IR ANGIOGRAM SELECTIVE EACH ADDITIONAL VESSEL  02/06/2018  . IR ANGIOGRAM SELECTIVE EACH ADDITIONAL VESSEL  02/06/2018  . IR INFUSION THROMBOL ARTERIAL INITIAL (MS)  02/06/2018  . IR INFUSION THROMBOL ARTERIAL INITIAL (MS)  02/06/2018  . IR THROMB F/U EVAL ART/VEN FINAL DAY (MS)  02/07/2018  . IR US GUIDE VASC ACCESS RIGHT  02/06/2018  . LOBECTOMY Right 09/02/2016   Procedure: RIGHT UPPER LOBECTOMY;  Surgeon: Melrose Nakayama, MD;  Location: [facility];  Service: Thoracic;  Laterality: Right;  . LYMPH NODE DISSECTION Right 09/02/2016   Procedure: LYMPH NODE DISSECTION, RIGHT LUNG;  Surgeon: Melrose Nakayama, MD;  Location: [facility];  Service: Thoracic;  Laterality: Right;  . MITRAL VALVE REPAIR N/A  03/24/2017   Procedure: MITRAL VALVE REPAIR (MVR) with Sorin Carbomedics Annuloflex size 28;  Surgeon: Rexene Alberts, MD;  Location: [facility];  Service: Open Heart Surgery;  Laterality: N/A;  . RIGHT/LEFT HEART CATH AND CORONARY ANGIOGRAPHY N/A 08/11/2016   Procedure: Right/Left Heart Cath and Coronary Angiography;  Surgeon: [name], MD;  Location: [facility];  Service: Cardiovascular;  Laterality: N/A;  . TEE WITHOUT CARDIOVERSION N/A 07/17/2016   Procedure: TRANSESOPHAGEAL ECHOCARDIOGRAM (TEE);  Surgeon: [name], MD;  Location[facility] ENDOSCOPY;  Service: Cardiovascular;  Laterality: N/A;  . TEE WITHOUT CARDIOVERSION N/A 03/24/2017   Procedure: TRANSESOPHAGEAL ECHOCARDIOGRAM (TEE);  Surgeon: Rexene Alberts, MD;  Location: [facility];  Service: Open Heart Surgery;  Laterality: N/A;  . TONSILLECTOMY  1950's  . TOTAL HIP ARTHROPLASTY Left 10/05/2014   dr [name]  . TOTAL HIP ARTHROPLASTY Left 10/05/2014   Procedure: LEFT TOTAL HIP ARTHROPLASTY ANTERIOR APPROACH;  Surgeon: Pilar Plate  Aluisio, MD;  Location: [facility];  Service: Orthopedics;  Laterality: Left;  Marland Kitchen VIDEO ASSISTED THORACOSCOPY (VATS)/WEDGE RESECTION Right 09/02/2016   Procedure: VIDEO ASSISTED THORACOSCOPY (VATS)/RIGHT UPPER LOBE WEDGE RESECTION;  Surgeon: Melrose Nakayama, MD;  Location: [facility];  Service: Thoracic;  Laterality: Right;  Marland Kitchen VIDEO BRONCHOSCOPY WITH ENDOBRONCHIAL NAVIGATION N/A 10/19/2012   Procedure: VIDEO BRONCHOSCOPY WITH ENDOBRONCHIAL NAVIGATION;  Surgeon: [name], MD;  Location: [facility];  Service: Thoracic;  Laterality: N/A;  . WRIST RECONSTRUCTION Left 12/2009   'proximal row carpectomy" [name]    REVIEW OF SYSTEMS:  A comprehensive review of systems was negative except for: Gastrointestinal: positive for constipation and diarrhea   PHYSICAL EXAMINATION: General appearance: alert, cooperative and no distress Head: Normocephalic, without obvious abnormality, atraumatic Neck: no adenopathy, no JVD, supple,  symmetrical, trachea midline and thyroid not enlarged, symmetric, no tenderness/mass/nodules Lymph nodes: Cervical, supraclavicular, and axillary nodes normal. Resp: clear to auscultation bilaterally Back: symmetric, no curvature. ROM normal. No CVA tenderness. Cardio: regular rate and rhythm, S1, S2 normal, no murmur, click, rub or gallop GI: soft, non-tender; bowel sounds normal; no masses,  no organomegaly Extremities: extremities normal, atraumatic, no cyanosis or edema  ECOG PERFORMANCE STATUS: 1 - Symptomatic but completely ambulatory  Blood pressure 135/68, pulse 68, temperature 98.6 F (37 C), temperature source Tympanic, resp. rate 18, height 5' 9"  (1.753 m), weight 233 lb 12.8 oz (106.1 kg), SpO2 96 %.  LABORATORY DATA: Lab Results  Component Value Date   WBC 7.1 02/15/2020   HGB 15.2 02/15/2020   HCT 45.9 02/15/2020   MCV 93.1 02/15/2020   PLT 151 02/15/2020      Chemistry      Component Value Date/Time   NA 139 12/15/2019 0901   NA 142 10/01/2016 1534   K 4.3 12/15/2019 0901   K 4.2 10/01/2016 1534   CL 108 12/15/2019 0901   CO2 24 12/15/2019 0901   CO2 28 10/01/2016 1534   BUN 15 12/15/2019 0901   BUN 15.9 10/01/2016 1534   CREATININE 1.36 (H) 12/15/2019 0901   CREATININE 1.10 02/08/2017 1101   CREATININE 1.5 (H) 10/01/2016 1534      Component Value Date/Time   CALCIUM 9.4 12/15/2019 0901   CALCIUM 9.6 10/01/2016 1534   ALKPHOS 48 12/15/2019 0901   ALKPHOS 65 10/01/2016 1534   AST 29 12/15/2019 0901   AST 20 10/01/2016 1534   ALT 27 12/15/2019 0901   ALT 24 10/01/2016 1534   BILITOT 0.7 12/15/2019 0901   BILITOT 0.46 10/01/2016 1534       RADIOGRAPHIC STUDIES: No results found.  ASSESSMENT AND PLAN: This is a very pleasant 69 years old white male with a stage Ia non-small cell lung cancer status post right upper lobectomy with lymph node dissection on September 02, 2016. The patient he was on observation since that time. Unfortunately there are some  concerning findings with nodular density in the posterior right lower lobe as well as increase and right pleural effusion suspicious for disease recurrence. He underwent ultrasound-guided right thoracentesis and the cytology of the pleural fluid was consistent with recurrent adenocarcinoma. He had molecular studies performed by [facility] 1 that showed positive EGFR mutation with deletion 81. The patient was started on treatment with Tagrisso 80 mg p.o. daily on 11/05/2017.  He status post 27 months of treatment. The patient has been tolerating his treatment fairly well with no concerning adverse effect except for few episodes of alternating diarrhea and constipation. I recommended for  him to continue his current treatment with Tagrisso with the same dose. I will see him back for follow-up visit in 2 months for evaluation after repeating CT scan of the chest, abdomen pelvis for restaging of his disease. The patient was advised to call immediately if he has any concerning symptoms in the interval. The patient voices understanding of current disease status and treatment options and is in agreement with the current care plan. All questions were answered. The patient knows to call the clinic with any problems, questions or concerns. We can certainly see the patient much sooner if necessary.  Disclaimer: This note was dictated with voice recognition software. Similar sounding words can inadvertently be transcribed and may not be corrected upon review.

## 2020-03-13 MED FILL — TAGRISSO 80 MG TABLET: 80 | 30 days supply | Qty: 30 | Fill #2

## 2020-03-27 ENCOUNTER — Ambulatory Visit (INDEPENDENT_AMBULATORY_CARE_PROVIDER_SITE_OTHER): Payer: Medicare PPO

## 2020-03-27 ENCOUNTER — Other Ambulatory Visit: Payer: Self-pay

## 2020-03-27 DIAGNOSIS — Z23 Encounter for immunization: Secondary | ICD-10-CM

## 2020-04-09 ENCOUNTER — Other Ambulatory Visit: Payer: Self-pay | Admitting: Internal Medicine

## 2020-04-09 DIAGNOSIS — C3491 Malignant neoplasm of unspecified part of right bronchus or lung: Secondary | ICD-10-CM

## 2020-04-12 ENCOUNTER — Other Ambulatory Visit: Payer: Self-pay

## 2020-04-12 ENCOUNTER — Inpatient Hospital Stay: Payer: Medicare PPO | Attending: Internal Medicine

## 2020-04-12 ENCOUNTER — Ambulatory Visit (HOSPITAL_COMMUNITY)
Admission: RE | Admit: 2020-04-12 | Discharge: 2020-04-12 | Disposition: A | Discharge status: Home / Self Care | Payer: Medicare PPO | Source: Ambulatory Visit | Attending: Internal Medicine | Admitting: Internal Medicine

## 2020-04-12 DIAGNOSIS — Z902 Acquired absence of lung [part of]: Secondary | ICD-10-CM | POA: Insufficient documentation

## 2020-04-12 DIAGNOSIS — C3411 Malignant neoplasm of upper lobe, right bronchus or lung: Secondary | ICD-10-CM | POA: Insufficient documentation

## 2020-04-12 DIAGNOSIS — Z79899 Other long term (current) drug therapy: Secondary | ICD-10-CM | POA: Diagnosis not present

## 2020-04-12 DIAGNOSIS — C3490 Malignant neoplasm of unspecified part of unspecified bronchus or lung: Secondary | ICD-10-CM | POA: Insufficient documentation

## 2020-04-12 DIAGNOSIS — Z951 Presence of aortocoronary bypass graft: Secondary | ICD-10-CM | POA: Insufficient documentation

## 2020-04-12 DIAGNOSIS — J90 Pleural effusion, not elsewhere classified: Secondary | ICD-10-CM | POA: Insufficient documentation

## 2020-04-12 DIAGNOSIS — Z86010 Personal history of colonic polyps: Secondary | ICD-10-CM | POA: Diagnosis not present

## 2020-04-12 DIAGNOSIS — Z96642 Presence of left artificial hip joint: Secondary | ICD-10-CM | POA: Diagnosis not present

## 2020-04-12 DIAGNOSIS — N281 Cyst of kidney, acquired: Secondary | ICD-10-CM | POA: Diagnosis not present

## 2020-04-12 DIAGNOSIS — J8410 Pulmonary fibrosis, unspecified: Secondary | ICD-10-CM | POA: Diagnosis not present

## 2020-04-12 LAB — CMP (CANCER CENTER ONLY)
ALT: 26 U/L (ref 0–44)
AST: 28 U/L (ref 15–41)
Albumin: 3.9 g/dL (ref 3.5–5.0)
Alkaline Phosphatase: 53 U/L (ref 38–126)
Anion gap: 10 (ref 5–15)
BUN: 20 mg/dL (ref 8–23)
CO2: 22 mmol/L (ref 22–32)
Calcium: 9.5 mg/dL (ref 8.9–10.3)
Chloride: 108 mmol/L (ref 98–111)
Creatinine: 1.4 mg/dL — ABNORMAL HIGH (ref 0.61–1.24)
GFR, Estimated: 54 mL/min — ABNORMAL LOW (ref >60)
Glucose, Bld: 116 mg/dL — ABNORMAL HIGH (ref 70–99)
Potassium: 4.4 mmol/L (ref 3.5–5.1)
Sodium: 140 mmol/L (ref 135–145)
Total Bilirubin: 0.7 mg/dL (ref 0.3–1.2)
Total Protein: 7.2 g/dL (ref 6.5–8.1)

## 2020-04-12 LAB — CBC WITH DIFFERENTIAL (CANCER CENTER ONLY)
Abs Immature Granulocytes: 0.04 10*3/uL (ref 0.00–0.07)
Basophils Absolute: 0 10*3/uL (ref 0.0–0.1)
Basophils Relative: 0 %
Eosinophils Absolute: 0.1 10*3/uL (ref 0.0–0.5)
Eosinophils Relative: 2 %
HCT: 46.1 % (ref 39.0–52.0)
Hemoglobin: 15.4 g/dL (ref 13.0–17.0)
Immature Granulocytes: 1 %
Lymphocytes Relative: 30 %
Lymphs Abs: 2.1 10*3/uL (ref 0.7–4.0)
MCH: 31.3 pg (ref 26.0–34.0)
MCHC: 33.4 g/dL (ref 30.0–36.0)
MCV: 93.7 fL (ref 80.0–100.0)
Monocytes Absolute: 0.7 10*3/uL (ref 0.1–1.0)
Monocytes Relative: 10 %
Neutro Abs: 4.2 10*3/uL (ref 1.7–7.7)
Neutrophils Relative %: 57 %
Platelet Count: 141 10*3/uL — ABNORMAL LOW (ref 150–400)
RBC: 4.92 MIL/uL (ref 4.22–5.81)
RDW: 14.5 % (ref 11.5–15.5)
WBC Count: 7.2 10*3/uL (ref 4.0–10.5)
nRBC: 0 % (ref 0.0–0.2)

## 2020-04-12 MED ORDER — IOHEXOL 300 MG/ML  SOLN
100.0000 mL | Freq: Once | INTRAMUSCULAR | Status: AC | PRN
  Administered 2020-04-12: 100 mL via INTRAVENOUS

## 2020-04-15 MED FILL — TAGRISSO 80 MG TABLET: 80 | 30 days supply | Qty: 30 | Fill #0

## 2020-04-16 ENCOUNTER — Inpatient Hospital Stay (HOSPITAL_BASED_OUTPATIENT_CLINIC_OR_DEPARTMENT_OTHER): Payer: Medicare PPO | Admitting: Internal Medicine

## 2020-04-16 ENCOUNTER — Other Ambulatory Visit: Payer: Self-pay

## 2020-04-16 ENCOUNTER — Telehealth: Payer: Self-pay | Admitting: Internal Medicine

## 2020-04-16 ENCOUNTER — Encounter: Payer: Self-pay | Admitting: Internal Medicine

## 2020-04-16 VITALS — BP 133/62 | HR 62 | Temp 97.9°F | Resp 17 | Ht 69.0 in | Wt 233.3 lb

## 2020-04-16 DIAGNOSIS — R918 Other nonspecific abnormal finding of lung field: Secondary | ICD-10-CM

## 2020-04-16 DIAGNOSIS — Z5111 Encounter for antineoplastic chemotherapy: Secondary | ICD-10-CM | POA: Diagnosis not present

## 2020-04-16 DIAGNOSIS — Z86010 Personal history of colonic polyps: Secondary | ICD-10-CM | POA: Diagnosis not present

## 2020-04-16 DIAGNOSIS — Z951 Presence of aortocoronary bypass graft: Secondary | ICD-10-CM | POA: Diagnosis not present

## 2020-04-16 DIAGNOSIS — C3411 Malignant neoplasm of upper lobe, right bronchus or lung: Secondary | ICD-10-CM

## 2020-04-16 DIAGNOSIS — C3491 Malignant neoplasm of unspecified part of right bronchus or lung: Secondary | ICD-10-CM | POA: Diagnosis not present

## 2020-04-16 DIAGNOSIS — Z96642 Presence of left artificial hip joint: Secondary | ICD-10-CM | POA: Diagnosis not present

## 2020-04-16 DIAGNOSIS — J90 Pleural effusion, not elsewhere classified: Secondary | ICD-10-CM | POA: Diagnosis not present

## 2020-04-16 DIAGNOSIS — J910 Malignant pleural effusion: Secondary | ICD-10-CM | POA: Diagnosis not present

## 2020-04-16 DIAGNOSIS — Z79899 Other long term (current) drug therapy: Secondary | ICD-10-CM | POA: Diagnosis not present

## 2020-04-16 DIAGNOSIS — C3490 Malignant neoplasm of unspecified part of unspecified bronchus or lung: Secondary | ICD-10-CM | POA: Diagnosis not present

## 2020-04-16 DIAGNOSIS — Z902 Acquired absence of lung [part of]: Secondary | ICD-10-CM | POA: Diagnosis not present

## 2020-04-16 NOTE — Telephone Encounter (Signed)
Scheduled per los. Gave avs and calendar  

## 2020-04-16 NOTE — Progress Notes (Signed)
[facility] [phone]   [phone]  OFFICE PROGRESS NOTE  [name], MD [place] [address]  DIAGNOSIS: Recurrent non-small cell lung cancer, adenocarcinoma initially diagnosed as stage IA (T1c, N0, M0) non-small cell lung cancer, adenocarcinoma presented with right upper lobe lung nodule in April 2018 with recurrence in June 2019.  Biomarker Findings Microsatellite status - MS-Stable Tumor Mutational Burden - TMB-Low (3 Muts/Mb) Genomic Findings For a complete list of the genes assayed, please refer to the Appendix. EGFR exon 19 deletion ([id number]>T) CDKN2A/B loss NKX2-1 amplification 7 Disease relevant genes with no reportable alterations: KRAS, ALK, BRAF, MET, RET, ERBB2, ROS1   PRIOR THERAPY: Status post right upper lobectomy with lymph node dissection under the care of Dr. [name] on 09/02/2016.  CURRENT THERAPY: Tagrisso 80 mg p.o. daily.  First dose started November 05, 2017.  Status post 29 months of treatment.  INTERVAL HISTORY: James [name] 69 y.o. male returns to the clinic today for follow-up visit accompanied by his wife.  The patient is very anxious today especially after reading his scan results on my chart.  He denied having any chest pain, shortness of breath, cough or hemoptysis.  He denied having any nausea, vomiting, diarrhea or constipation.  He denied having any headache or visual changes.  He has no weight loss or night sweats.  Has been tolerating his treatment with Tagrisso fairly well.  He had repeat CT scan of the chest, abdomen pelvis performed recently and is here for evaluation and discussion of his scan results.  MEDICAL HISTORY: Past Medical History:  Diagnosis Date  . Adenocarcinoma of right lung, stage 1 ([place]) 09/06/2016  . Anxiety   . Arthritis    "knees, hips, back" (10/19/2012)  . Chronic diastolic congestive heart failure ([place])   . Chronic lower back pain   . Colon polyps     10/27/2002, repeat letter 09/17/2007  . Coronary artery disease   . Coronary artery disease involving native coronary artery of native heart without angina pectoris   . Depressive disorder, not elsewhere classified    no meds  . Diabetes mellitus without complication ([place])    diet controlled- no med  (while in hosp 4/18 -elevated cbg  . Dyspnea   . Fasting hyperglycemia   . GERD (gastroesophageal reflux disease)   . Heart murmur   . Hemoptysis    abnormal CT Chest 01/29/10 - ? new GG changes RUL > not viz on plain cxr 02/26/2010  . Hypertension   . Mitral regurgitation    severe MR 08/2016  . MPN (myeloproliferative neoplasm) ([place])    1st detected 06/04/1998  . Obesity   . OSA on CPAP    last sleep study 10 years ago  . Other and unspecified hyperlipidemia   . Peripheral vascular disease ([place]) 08/2016   after lung surgey small clots in lungs,after hip dvt-5/16  . Pneumonia    4/18  . Positive PPD 1965   "non reactive in 2012" (10/19/2012)  . Routine general medical examination at a health care facility   . S/P CABG x 1 03/24/2017   LIMA to LAD  . S/P mitral valve repair 03/24/2017   Complex valvuloplasty including artificial Gore-tex neochord placement x6 and 28 mm Sorin Annuloflex posterior annuloplasty band  . Special screening for malignant neoplasm of prostate   . Spinal stenosis, unspecified region other than cervical   . Wrist pain, left  ALLERGIES:  is allergic to symbicort [budesonide-formoterol fumarate] and amoxicillin.  MEDICATIONS:  Current Outpatient Medications  Medication Sig Dispense Refill  . ezetimibe (ZETIA) 10 MG tablet Take 1 tablet (10 mg total) by mouth daily. 90 tablet 3  . furosemide (LASIX) 20 MG tablet Take 0.5 tablets (10 mg total) by mouth daily. Take 1/2 tablet 10 mg daily 45 tablet 3  . metoprolol tartrate (LOPRESSOR) 25 MG tablet Take 0.5 tablets (12.5 mg total) by mouth 2 (two) times daily. 90 tablet 3  . Multiple Vitamin (MULTIVITAMIN)  tablet Take 1 tablet by mouth daily.    . Omega-3 Fatty Acids (FISH OIL) 500 MG CAPS Take 500 mg by mouth daily.     . rivaroxaban (XARELTO) 20 MG TABS tablet Take 1 tablet (20 mg total) by mouth daily with supper. 90 tablet 3  . sildenafil (REVATIO) 20 MG tablet Take 4 tablets (80 mg total) by mouth daily as needed. 60 tablet 5  . TAGRISSO 80 MG tablet TAKE 1 TABLET (80 MG TOTAL) BY MOUTH DAILY. 30 tablet 2   No current facility-administered medications for this visit.    SURGICAL HISTORY:  Past Surgical History:  Procedure Laterality Date  . ANTERIOR CRUCIATE LIGAMENT REPAIR Left 1967  . CARDIAC CATHETERIZATION  2000  . CHEST TUBE INSERTION Right 10/19/2012   post bronch  . COLONOSCOPY W/ POLYPECTOMY    . CORONARY ARTERY BYPASS GRAFT N/A 03/24/2017   Procedure: CORONARY ARTERY BYPASS GRAFTING (CABG)x1 using left internal mammary artery, LIMA-LAD;  Surgeon: Rexene Alberts, MD;  Location: [facility];  Service: Open Heart Surgery;  Laterality: N/A;  . FLEXIBLE BRONCHOSCOPY  10/19/2012   Flexible video fiberoptic bronchoscopy with electromagnetic navigation and biopsies.  . INTRAVASCULAR PRESSURE WIRE/FFR STUDY N/A 08/11/2016   Procedure: Intravascular Pressure Wire/FFR Study;  Surgeon: [name], MD;  Location: [facility];  Service: Cardiovascular;  Laterality: N/A;  . IR ANGIOGRAM PULMONARY BILATERAL SELECTIVE  02/06/2018  . IR ANGIOGRAM SELECTIVE EACH ADDITIONAL VESSEL  02/06/2018  . IR ANGIOGRAM SELECTIVE EACH ADDITIONAL VESSEL  02/06/2018  . IR INFUSION THROMBOL ARTERIAL INITIAL (MS)  02/06/2018  . IR INFUSION THROMBOL ARTERIAL INITIAL (MS)  02/06/2018  . IR THROMB F/U EVAL ART/VEN FINAL DAY (MS)  02/07/2018  . IR US GUIDE VASC ACCESS RIGHT  02/06/2018  . LOBECTOMY Right 09/02/2016   Procedure: RIGHT UPPER LOBECTOMY;  Surgeon: Melrose Nakayama, MD;  Location: [facility];  Service: Thoracic;  Laterality: Right;  . LYMPH NODE DISSECTION Right 09/02/2016   Procedure: LYMPH NODE  DISSECTION, RIGHT LUNG;  Surgeon: Melrose Nakayama, MD;  Location: [facility];  Service: Thoracic;  Laterality: Right;  . MITRAL VALVE REPAIR N/A 03/24/2017   Procedure: MITRAL VALVE REPAIR (MVR) with Sorin [employer]omedics Annuloflex size 28;  Surgeon: Rexene Alberts, MD;  Location: [facility];  Service: Open Heart Surgery;  Laterality: N/A;  . RIGHT/LEFT HEART CATH AND CORONARY ANGIOGRAPHY N/A 08/11/2016   Procedure: Right/Left Heart Cath and Coronary Angiography;  Surgeon: [name], MD;  Location: [facility];  Service: Cardiovascular;  Laterality: N/A;  . TEE WITHOUT CARDIOVERSION N/A 07/17/2016   Procedure: TRANSESOPHAGEAL ECHOCARDIOGRAM (TEE);  Surgeon: [name], MD;  Location: [facility];  Service: Cardiovascular;  Laterality: N/A;  . TEE WITHOUT CARDIOVERSION N/A 03/24/2017   Procedure: TRANSESOPHAGEAL ECHOCARDIOGRAM (TEE);  Surgeon: Rexene Alberts, MD;  Location: [facility];  Service: Open Heart Surgery;  Laterality: N/A;  . TONSILLECTOMY  1950's  . TOTAL  HIP ARTHROPLASTY Left 10/05/2014   dr [name]  . TOTAL HIP ARTHROPLASTY Left 10/05/2014   Procedure: LEFT TOTAL HIP ARTHROPLASTY ANTERIOR APPROACH;  Surgeon: [name], MD;  Location: [facility];  Service: Orthopedics;  Laterality: Left;  Marland Kitchen VIDEO ASSISTED THORACOSCOPY (VATS)/WEDGE RESECTION Right 09/02/2016   Procedure: VIDEO ASSISTED THORACOSCOPY (VATS)/RIGHT UPPER LOBE WEDGE RESECTION;  Surgeon: Melrose Nakayama, MD;  Location: [facility];  Service: Thoracic;  Laterality: Right;  Marland Kitchen VIDEO BRONCHOSCOPY WITH ENDOBRONCHIAL NAVIGATION N/A 10/19/2012   Procedure: VIDEO BRONCHOSCOPY WITH ENDOBRONCHIAL NAVIGATION;  Surgeon: [name], MD;  Location: [facility];  Service: Thoracic;  Laterality: N/A;  . WRIST RECONSTRUCTION Left 12/2009   'proximal row carpectomy" [name]    REVIEW OF SYSTEMS:  Constitutional: negative Eyes: negative Ears, nose, mouth, throat, and face: negative Respiratory: negative Cardiovascular: negative  Gastrointestinal: negative Genitourinary:negative Integument/breast: negative Hematologic/lymphatic: negative Musculoskeletal:negative Neurological: negative Behavioral/Psych: positive for anxiety Endocrine: negative Allergic/Immunologic: negative   PHYSICAL EXAMINATION: General appearance: alert, cooperative and no distress Head: Normocephalic, without obvious abnormality, atraumatic Neck: no adenopathy, no JVD, supple, symmetrical, trachea midline and thyroid not enlarged, symmetric, no tenderness/mass/nodules Lymph nodes: Cervical, supraclavicular, and axillary nodes normal. Resp: clear to auscultation bilaterally Back: symmetric, no curvature. ROM normal. No CVA tenderness. Cardio: regular rate and rhythm, S1, S2 normal, no murmur, click, rub or gallop GI: soft, non-tender; bowel sounds normal; no masses,  no organomegaly Extremities: extremities normal, atraumatic, no cyanosis or edema Neurologic: Alert and oriented X 3, normal strength and tone. Normal symmetric reflexes. Normal coordination and gait  ECOG PERFORMANCE STATUS: 1 - Symptomatic but completely ambulatory  Blood pressure 133/62, pulse 62, temperature 97.9 F (36.6 C), temperature source Tympanic, resp. rate 17, height _0  (1.753 m), weight 233 lb 4.8 oz (105.8 kg), SpO2 98 %.  LABORATORY DATA: Lab Results  Component Value Date   WBC 7.2 04/12/2020   HGB 15.4 04/12/2020   HCT 46.1 04/12/2020   MCV 93.7 04/12/2020   PLT 141 (L) 04/12/2020      Chemistry      Component Value Date/Time   NA 140 04/12/2020 0923   NA 142 10/01/2016 1534   K 4.4 04/12/2020 0923   K 4.2 10/01/2016 1534   CL 108 04/12/2020 0923   CO2 22 04/12/2020 0923   CO2 28 10/01/2016 1534   BUN 20 04/12/2020 0923   BUN 15.9 10/01/2016 1534   CREATININE 1.40 (H) 04/12/2020 0923   CREATININE 1.10 02/08/2017 1101   CREATININE 1.5 (H) 10/01/2016 1534      Component Value Date/Time   CALCIUM 9.5 04/12/2020 0923   CALCIUM 9.6  10/01/2016 1534   ALKPHOS 53 04/12/2020 0923   ALKPHOS 65 10/01/2016 1534   AST 28 04/12/2020 0923   AST 20 10/01/2016 1534   ALT 26 04/12/2020 0923   ALT 24 10/01/2016 1534   BILITOT 0.7 04/12/2020 0923   BILITOT 0.46 10/01/2016 1534       RADIOGRAPHIC STUDIES: CT Chest W Contrast  Result Date: 04/13/2020 CLINICAL DATA:  Non-small-cell lung cancer.  Restaging. EXAM: CT CHEST, ABDOMEN AND PELVIS WITHOUT CONTRAST TECHNIQUE: Multidetector CT imaging of the chest, abdomen and pelvis was performed following the standard protocol without IV contrast. COMPARISON:  12/15/2019 FINDINGS: CT CHEST FINDINGS Cardiovascular: The heart size is normal. No substantial pericardial effusion. Coronary artery calcification is evident. Atherosclerotic calcification is noted in the wall of the thoracic aorta. Mediastinum/Nodes: No mediastinal lymphadenopathy. There is no hilar lymphadenopathy. The esophagus has normal imaging features. There  is no axillary lymphadenopathy. Lungs/Pleura: Status post right upper lobectomy. The 9 x 4 mm perifissural medial right lower lobe nodule identified as new on the previous study is progressive in the interval measuring 13 x 8 mm on image 85/4 today. 4 mm peripheral right lower lobe nodule on 62/4 is stable. Adjacent anterior left upper lobe nodules each measuring about 3 mm on images 35 and 37 of series 4 are stable in the interval. Peripheral scarring in the posterior left base with associated calcification is stable. Tiny peripheral calcified granulomata are noted bilaterally. No pleural effusion. Musculoskeletal: No worrisome lytic or sclerotic osseous abnormality. CT ABDOMEN PELVIS FINDINGS Hepatobiliary: Stable appearance septated cyst posterior right liver. Subcapsular cyst in the lateral segment left liver is also unchanged. Tiny hypodensity in the subcapsular right hepatic dome is stable. Gallbladder is nondistended. No intrahepatic or extrahepatic biliary dilation. Pancreas:  No focal mass lesion. No dilatation of the main duct. No intraparenchymal cyst. No peripancreatic edema. Spleen: No splenomegaly. No focal mass lesion. Adrenals/Urinary Tract: No adrenal nodule or mass. Right kidney unremarkable. Stable 2.7 cm cyst lower pole left kidney with central sinus cysts also noted in the left kidney. No evidence for hydroureter. The urinary bladder appears normal for the degree of distention. Stomach/Bowel: Stomach is unremarkable. No gastric wall thickening. No evidence of outlet obstruction. Duodenum is normally positioned as is the ligament of Treitz. No small bowel wall thickening. No small bowel dilatation. The terminal ileum is normal. The appendix is normal. No gross colonic mass. No colonic wall thickening. Vascular/Lymphatic: There is abdominal aortic atherosclerosis without aneurysm. There is no gastrohepatic or hepatoduodenal ligament lymphadenopathy. No retroperitoneal or mesenteric lymphadenopathy. No pelvic sidewall lymphadenopathy. Reproductive: Prostate gland is enlarged. Other: No intraperitoneal free fluid. Musculoskeletal: Left hip replacement. Degenerative changes noted right hip. No worrisome lytic or sclerotic osseous abnormality. IMPRESSION: 1. Interval progression of the perifissural medial right lower lobe pulmonary nodule, now measuring 13 x 8 mm. Imaging features are concerning for recurrent disease. Close follow-up recommended. PET-CT may prove helpful to further evaluate as clinically warranted. 2. No evidence for metastatic disease in the abdomen or pelvis. 3. Tiny noncalcified bilateral pulmonary nodules are stable and associated with scattered bilateral tiny peripheral calcified granulomata. 4. Status post right upper lobectomy. 5. Stable appearance hepatic and left renal cysts. 6. Prostatomegaly. 7. Aortic Atherosclerosis (ICD10-I70.0). Electronically Signed   By: Misty Stanley M.D.   On: 04/13/2020 15:18   CT Abdomen Pelvis W Contrast  Result Date:  04/13/2020 CLINICAL DATA:  Non-small-cell lung cancer.  Restaging. EXAM: CT CHEST, ABDOMEN AND PELVIS WITHOUT CONTRAST TECHNIQUE: Multidetector CT imaging of the chest, abdomen and pelvis was performed following the standard protocol without IV contrast. COMPARISON:  12/15/2019 FINDINGS: CT CHEST FINDINGS Cardiovascular: The heart size is normal. No substantial pericardial effusion. Coronary artery calcification is evident. Atherosclerotic calcification is noted in the wall of the thoracic aorta. Mediastinum/Nodes: No mediastinal lymphadenopathy. There is no hilar lymphadenopathy. The esophagus has normal imaging features. There is no axillary lymphadenopathy. Lungs/Pleura: Status post right upper lobectomy. The 9 x 4 mm perifissural medial right lower lobe nodule identified as new on the previous study is progressive in the interval measuring 13 x 8 mm on image 85/4 today. 4 mm peripheral right lower lobe nodule on 62/4 is stable. Adjacent anterior left upper lobe nodules each measuring about 3 mm on images 35 and 37 of series 4 are stable in the interval. Peripheral scarring in the posterior left base with associated calcification  is stable. Tiny peripheral calcified granulomata are noted bilaterally. No pleural effusion. Musculoskeletal: No worrisome lytic or sclerotic osseous abnormality. CT ABDOMEN PELVIS FINDINGS Hepatobiliary: Stable appearance septated cyst posterior right liver. Subcapsular cyst in the lateral segment left liver is also unchanged. Tiny hypodensity in the subcapsular right hepatic dome is stable. Gallbladder is nondistended. No intrahepatic or extrahepatic biliary dilation. Pancreas: No focal mass lesion. No dilatation of the main duct. No intraparenchymal cyst. No peripancreatic edema. Spleen: No splenomegaly. No focal mass lesion. Adrenals/Urinary Tract: No adrenal nodule or mass. Right kidney unremarkable. Stable 2.7 cm cyst lower pole left kidney with central sinus cysts also noted in  the left kidney. No evidence for hydroureter. The urinary bladder appears normal for the degree of distention. Stomach/Bowel: Stomach is unremarkable. No gastric wall thickening. No evidence of outlet obstruction. Duodenum is normally positioned as is the ligament of Treitz. No small bowel wall thickening. No small bowel dilatation. The terminal ileum is normal. The appendix is normal. No gross colonic mass. No colonic wall thickening. Vascular/Lymphatic: There is abdominal aortic atherosclerosis without aneurysm. There is no gastrohepatic or hepatoduodenal ligament lymphadenopathy. No retroperitoneal or mesenteric lymphadenopathy. No pelvic sidewall lymphadenopathy. Reproductive: Prostate gland is enlarged. Other: No intraperitoneal free fluid. Musculoskeletal: Left hip replacement. Degenerative changes noted right hip. No worrisome lytic or sclerotic osseous abnormality. IMPRESSION: 1. Interval progression of the perifissural medial right lower lobe pulmonary nodule, now measuring 13 x 8 mm. Imaging features are concerning for recurrent disease. Close follow-up recommended. PET-CT may prove helpful to further evaluate as clinically warranted. 2. No evidence for metastatic disease in the abdomen or pelvis. 3. Tiny noncalcified bilateral pulmonary nodules are stable and associated with scattered bilateral tiny peripheral calcified granulomata. 4. Status post right upper lobectomy. 5. Stable appearance hepatic and left renal cysts. 6. Prostatomegaly. 7. Aortic Atherosclerosis (ICD10-I70.0). Electronically Signed   By: Misty Stanley M.D.   On: 04/13/2020 15:18    ASSESSMENT AND PLAN: This is a very pleasant 69 years old white male with a stage Ia non-small cell lung cancer status post right upper lobectomy with lymph node dissection on September 02, 2016. The patient he was on observation since that time. Unfortunately there are some concerning findings with nodular density in the posterior right lower lobe as well  as increase and right pleural effusion suspicious for disease recurrence. He underwent ultrasound-guided right thoracentesis and the cytology of the pleural fluid was consistent with recurrent adenocarcinoma. He had molecular studies performed by foundation 1 that showed positive EGFR mutation with deletion 4. The patient was started on treatment with Tagrisso 80 mg p.o. daily on 11/05/2017.  He status post 29 months of treatment. The patient has been tolerating his treatment well with no concerning adverse effects. He had repeat CT scan of the chest, abdomen pelvis performed recently.  I personally and independently reviewed the scan images and discussed the result and showed the images to the patient and his wife today. Has a scan showed no concerning findings for disease progression except for enlargement of perifissural medial right lower lobe pulmonary nodule. I discussed with the patient several options for management of his condition including repeating a PET scan for further evaluation of this nodule followed by SBRT if it is hypermetabolic.  The other option was continuous observation and close monitoring as he continues his treatment with Tagrisso. The patient is interested in proceeding with a PET scan and we will arrange for this to be done in the next few weeks.  He will continue his current treatment with Tagrisso with the same dose. The patient will come back for follow-up visit in 3 weeks for evaluation and discussion of his treatment options based on the PET scan results. He was advised to call immediately if he has any concerning symptoms in the interval. The patient voices understanding of current disease status and treatment options and is in agreement with the current care plan. All questions were answered. The patient knows to call the clinic with any problems, questions or concerns. We can certainly see the patient much sooner if necessary.  Disclaimer: This note was dictated  with voice recognition software. Similar sounding words can inadvertently be transcribed and may not be corrected upon review.

## 2020-04-22 DIAGNOSIS — N1831 Chronic kidney disease, stage 3a: Secondary | ICD-10-CM | POA: Diagnosis not present

## 2020-04-30 ENCOUNTER — Ambulatory Visit (HOSPITAL_COMMUNITY)
Admission: RE | Admit: 2020-04-30 | Discharge: 2020-04-30 | Disposition: A | Discharge status: Home / Self Care | Payer: Medicare PPO | Source: Ambulatory Visit | Attending: Internal Medicine | Admitting: Internal Medicine

## 2020-04-30 ENCOUNTER — Other Ambulatory Visit: Payer: Self-pay

## 2020-04-30 DIAGNOSIS — N400 Enlarged prostate without lower urinary tract symptoms: Secondary | ICD-10-CM | POA: Insufficient documentation

## 2020-04-30 DIAGNOSIS — I348 Other nonrheumatic mitral valve disorders: Secondary | ICD-10-CM | POA: Insufficient documentation

## 2020-04-30 DIAGNOSIS — K5730 Diverticulosis of large intestine without perforation or abscess without bleeding: Secondary | ICD-10-CM | POA: Diagnosis not present

## 2020-04-30 DIAGNOSIS — K7689 Other specified diseases of liver: Secondary | ICD-10-CM | POA: Diagnosis not present

## 2020-04-30 DIAGNOSIS — R918 Other nonspecific abnormal finding of lung field: Secondary | ICD-10-CM | POA: Diagnosis not present

## 2020-04-30 DIAGNOSIS — I700 Atherosclerosis of aorta: Secondary | ICD-10-CM | POA: Diagnosis not present

## 2020-04-30 DIAGNOSIS — C3490 Malignant neoplasm of unspecified part of unspecified bronchus or lung: Secondary | ICD-10-CM

## 2020-04-30 DIAGNOSIS — M47816 Spondylosis without myelopathy or radiculopathy, lumbar region: Secondary | ICD-10-CM | POA: Diagnosis not present

## 2020-04-30 DIAGNOSIS — C3431 Malignant neoplasm of lower lobe, right bronchus or lung: Secondary | ICD-10-CM | POA: Diagnosis not present

## 2020-04-30 LAB — GLUCOSE, CAPILLARY: Glucose-Capillary: 104 mg/dL — ABNORMAL HIGH (ref 70–99)

## 2020-04-30 MED ORDER — FLUDEOXYGLUCOSE F - 18 (FDG) INJECTION
11.4000 | Freq: Once | INTRAVENOUS | Status: AC
  Administered 2020-04-30: 11.4 via INTRAVENOUS

## 2020-05-01 DIAGNOSIS — I129 Hypertensive chronic kidney disease with stage 1 through stage 4 chronic kidney disease, or unspecified chronic kidney disease: Secondary | ICD-10-CM | POA: Diagnosis not present

## 2020-05-01 DIAGNOSIS — E1122 Type 2 diabetes mellitus with diabetic chronic kidney disease: Secondary | ICD-10-CM | POA: Diagnosis not present

## 2020-05-01 DIAGNOSIS — N2581 Secondary hyperparathyroidism of renal origin: Secondary | ICD-10-CM | POA: Diagnosis not present

## 2020-05-01 DIAGNOSIS — N1831 Chronic kidney disease, stage 3a: Secondary | ICD-10-CM | POA: Diagnosis not present

## 2020-05-05 NOTE — Progress Notes (Deleted)
Cardiology Office Note    Date:  05/05/2020   ID:  James [name], DOB 1950-08-09, MRN [id number]  PCP:  [name], MD  Cardiologist:  [name]   No chief complaint on file.   History of Present Illness:  James [name] is a 69 y.o. male with PMH of DM II, HLD, and severe MR. he was originally noted to have a loud murmur by his primary care physician.  Initial echocardiogram suggested mild MR, however it also suggested partially flail leaflet.  MR was thought to be underestimated.  TEE showed a flail P3 segment with ruptured cord and a severe MR.  Cardiac catheterization performed on 08/11/2016 showed 65% mid to distal LAD lesion, FFR borderline at 0.81, EF 65%.  Normal cardiac output and RV/LV filling pressure.  He had a PET scan on 08/17/2016 that showed a hypermetabolic solid 2.6 cm anterior right upper lobe pulmonary nodule compatible with prior bronchogenic, no hypermetabolic thoracic lymphadenopathy or distal metastatic disease.  There were nonspecific and mild asymmetric hypermetabolism in the left pontine tonsil and the left tongue base region.  CT surgery recommended lobectomy for his lung mass with possible CABG with LIMA to LAD and MV repair.  He underwent right upper lobe lobectomy in April.  Pathology positive for adenocarcinoma stage Ia.  Serial CT scan was recommended by oncology.  Post procedure, he became very short of breath.  CTA was positive for a small PE, he was started on Xarelto.  Repeat echocardiogram obtained on 02/15/2017 showed EF 60-65%, persistent MR.  He eventually underwent mitral valve repair with Sorin Carbomedics Annuloglex size 28, LIMA to LAD by Dr. Roxy Manns. He was DC on coumadin instead of Xarelto. Completed 6 months of anticoagulation for provoked PE and then it was stopped.   When last seen in April he was progressing well. More recently in August CT showed a right pleural effusion. Thoracentesis was positive for recurrent CA. He was started on Tagrisso by Dr.  Inda Merlin. He was admitted on February 06, 2018 with submassive PE. He was treated with  EKOS with bilateral intra-arterial TPA, patient completed 6 days of IV heparin.The original plan was to transition to Xarelto after 5 days of IV heparin. However he developed left flank pain, hematuria and right leg painful swelling. Hence IV heparin was continued. Both Urology andOIrthopedics evaluated and cleared for transitioning to [facility].  Xarelto was started on 02/12/2018.  Hematuria decreased  Right leg pain also improved. MRI did show evidence of hematoma that was improving. He had chronic DVT in right peroneal vein.  Since patient was started on Xarelto, aspirin discontinued to minimize bleeding risk. He had follow up CT on Dec 21 showing a new pulmonary nodule in the RLL. PET showed increase in size concerning for slow growing tumor.   On follow up today he is doing well. No chest pain or increased SOB. No edema. Taking lasix 10 mg daily. Seeing Dr Posey Pronto now for CKD. On low salt diet. Denies any new complaints.   Past Medical History:  Diagnosis Date  . Adenocarcinoma of right lung, stage 1 ([place]) 09/06/2016  . Anxiety   . Arthritis    "knees, hips, back" (10/19/2012)  . Chronic diastolic congestive heart failure ([place])   . Chronic lower back pain   . Colon polyps    10/27/2002, repeat letter 09/17/2007  . Coronary artery disease   . Coronary artery disease involving native coronary artery of native heart without angina pectoris   .  Depressive disorder, not elsewhere classified    no meds  . Diabetes mellitus without complication ([place])    diet controlled- no med  (while in hosp 4/18 -elevated cbg  . Dyspnea   . Fasting hyperglycemia   . GERD (gastroesophageal reflux disease)   . Heart murmur   . Hemoptysis    abnormal CT Chest 01/29/10 - ? new GG changes RUL > not viz on plain cxr 02/26/2010  . Hypertension   . Mitral regurgitation    severe MR 08/2016  . MPN (myeloproliferative neoplasm) ([place])     1st detected 06/04/1998  . Obesity   . OSA on CPAP    last sleep study 10 years ago  . Other and unspecified hyperlipidemia   . Peripheral vascular disease ([place]) 08/2016   after lung surgey small clots in lungs,after hip dvt-5/16  . Pneumonia    4/18  . Positive PPD 1965   "non reactive in 2012" (10/19/2012)  . Routine general medical examination at a health care facility   . S/P CABG x 1 03/24/2017   LIMA to LAD  . S/P mitral valve repair 03/24/2017   Complex valvuloplasty including artificial Gore-tex neochord placement x6 and 28 mm Sorin Annuloflex posterior annuloplasty band  . Special screening for malignant neoplasm of prostate   . Spinal stenosis, unspecified region other than cervical   . Wrist pain, left     Past Surgical History:  Procedure Laterality Date  . ANTERIOR CRUCIATE LIGAMENT REPAIR Left 1967  . CARDIAC CATHETERIZATION  2000  . CHEST TUBE INSERTION Right 10/19/2012   post bronch  . COLONOSCOPY W/ POLYPECTOMY    . CORONARY ARTERY BYPASS GRAFT N/A 03/24/2017   Procedure: CORONARY ARTERY BYPASS GRAFTING (CABG)x1 using left internal mammary artery, LIMA-LAD;  Surgeon: Rexene Alberts, MD;  Location: [facility];  Service: Open Heart Surgery;  Laterality: N/A;  . FLEXIBLE BRONCHOSCOPY  10/19/2012   Flexible video fiberoptic bronchoscopy with electromagnetic navigation and biopsies.  . INTRAVASCULAR PRESSURE WIRE/FFR STUDY N/A 08/11/2016   Procedure: Intravascular Pressure Wire/FFR Study;  Surgeon: Kruti Horacek M Martinique, MD;  Location: [facility];  Service: Cardiovascular;  Laterality: N/A;  . IR ANGIOGRAM PULMONARY BILATERAL SELECTIVE  02/06/2018  . IR ANGIOGRAM SELECTIVE EACH ADDITIONAL VESSEL  02/06/2018  . IR ANGIOGRAM SELECTIVE EACH ADDITIONAL VESSEL  02/06/2018  . IR INFUSION THROMBOL ARTERIAL INITIAL (MS)  02/06/2018  . IR INFUSION THROMBOL ARTERIAL INITIAL (MS)  02/06/2018  . IR THROMB F/U EVAL ART/VEN FINAL DAY (MS)  02/07/2018  . IR US GUIDE VASC ACCESS RIGHT   02/06/2018  . LOBECTOMY Right 09/02/2016   Procedure: RIGHT UPPER LOBECTOMY;  Surgeon: Melrose Nakayama, MD;  Location: [facility];  Service: Thoracic;  Laterality: Right;  . LYMPH NODE DISSECTION Right 09/02/2016   Procedure: LYMPH NODE DISSECTION, RIGHT LUNG;  Surgeon: Melrose Nakayama, MD;  Location: [facility];  Service: Thoracic;  Laterality: Right;  . MITRAL VALVE REPAIR N/A 03/24/2017   Procedure: MITRAL VALVE REPAIR (MVR) with Sorin Carbomedics Annuloflex size 28;  Surgeon: Rexene Alberts, MD;  Location: [facility];  Service: Open Heart Surgery;  Laterality: N/A;  . RIGHT/LEFT HEART CATH AND CORONARY ANGIOGRAPHY N/A 08/11/2016   Procedure: Right/Left Heart Cath and Coronary Angiography;  Surgeon: Gene Glazebrook M Martinique, MD;  Location: [facility];  Service: Cardiovascular;  Laterality: N/A;  . TEE WITHOUT CARDIOVERSION N/A 07/17/2016   Procedure: TRANSESOPHAGEAL ECHOCARDIOGRAM (TEE);  Surgeon: [name], MD;  Location: [facility];  Service: Cardiovascular;  Laterality: N/A;  . TEE WITHOUT CARDIOVERSION N/A 03/24/2017   Procedure: TRANSESOPHAGEAL ECHOCARDIOGRAM (TEE);  Surgeon: Rexene Alberts, MD;  Location: [facility];  Service: Open Heart Surgery;  Laterality: N/A;  . TONSILLECTOMY  1950's  . TOTAL HIP ARTHROPLASTY Left 10/05/2014   dr [name]  . TOTAL HIP ARTHROPLASTY Left 10/05/2014   Procedure: LEFT TOTAL HIP ARTHROPLASTY ANTERIOR APPROACH;  Surgeon: [name], MD;  Location: [facility];  Service: Orthopedics;  Laterality: Left;  Marland Kitchen VIDEO ASSISTED THORACOSCOPY (VATS)/WEDGE RESECTION Right 09/02/2016   Procedure: VIDEO ASSISTED THORACOSCOPY (VATS)/RIGHT UPPER LOBE WEDGE RESECTION;  Surgeon: Melrose Nakayama, MD;  Location: [facility];  Service: Thoracic;  Laterality: Right;  Marland Kitchen VIDEO BRONCHOSCOPY WITH ENDOBRONCHIAL NAVIGATION N/A 10/19/2012   Procedure: VIDEO BRONCHOSCOPY WITH ENDOBRONCHIAL NAVIGATION;  Surgeon: [name], MD;  Location: [facility];  Service: Thoracic;  Laterality: N/A;  . WRIST  RECONSTRUCTION Left 12/2009   'proximal row carpectomy" [name]    Current Medications: Outpatient Medications Prior to Visit  Medication Sig Dispense Refill  . ezetimibe (ZETIA) 10 MG tablet Take 1 tablet (10 mg total) by mouth daily. 90 tablet 3  . furosemide (LASIX) 20 MG tablet Take 0.5 tablets (10 mg total) by mouth daily. Take 1/2 tablet 10 mg daily 45 tablet 3  . metoprolol tartrate (LOPRESSOR) 25 MG tablet Take 0.5 tablets (12.5 mg total) by mouth 2 (two) times daily. 90 tablet 3  . Multiple Vitamin (MULTIVITAMIN) tablet Take 1 tablet by mouth daily.    . Omega-3 Fatty Acids (FISH OIL) 500 MG CAPS Take 500 mg by mouth daily.     . rivaroxaban (XARELTO) 20 MG TABS tablet Take 1 tablet (20 mg total) by mouth daily with supper. 90 tablet 3  . sildenafil (REVATIO) 20 MG tablet Take 4 tablets (80 mg total) by mouth daily as needed. 60 tablet 5  . TAGRISSO 80 MG tablet TAKE 1 TABLET (80 MG TOTAL) BY MOUTH DAILY. 30 tablet 2   No facility-administered medications prior to visit.     Allergies:   Symbicort [budesonide-formoterol fumarate] and Amoxicillin   Social History   Socioeconomic History  . Marital status: Married    Spouse name: Not on file  . Number of children: 3  . Years of education: Not on file  . Highest education level: Not on file  Occupational History  . Occupation: Retired, Financial risk analyst  . Occupation: Retired, Real estate    Comment: Slow  Tobacco Use  . Smoking status: Never Smoker  . Smokeless tobacco: Never Used  Vaping Use  . Vaping Use: Never used  Substance and Sexual Activity  . Alcohol use: Yes    Alcohol/week: 1.0 standard drink    Types: 1 Cans of beer per week    Comment: none since 3 weeks  . Drug use: No  . Sexual activity: Never    Partners: Female  Other Topics Concern  . Not on file  Social History Narrative   HSG, [employer]. Married '73.  2 sons - '74,   '76;  1 daughter -  '77  6 grandchildren.   Work - Designer, jewellery now retired. ACP - not fully discussed.                   Social Determinants of Health   Financial Resource Strain: Not on file  Food Insecurity: Not on file  Transportation Needs: Not on file  Physical Activity: Not on file  Stress: Not on file  Social Connections: Not on file     Family History:  The patient's family history includes Heart attack in his father; Heart disease in his father and paternal uncle; Heart failure in his mother.   ROS:   Please see the history of present illness.    ROS All other systems reviewed and are negative.   PHYSICAL EXAM:   VS:  There were no vitals taken for this visit.   GENERAL:  Well appearing WM in NAD HEENT:  PERRL, EOMI, sclera are clear. Oropharynx is clear. NECK:  No jugular venous distention, carotid upstroke brisk and symmetric, no bruits, no thyromegaly or adenopathy LUNGS:  Clear to auscultation bilaterally CHEST:  Unremarkable HEART:  RRR,  PMI not displaced or sustained,S1 and S2 within normal limits, no S3, no S4: no clicks, no rubs, gr 2/6 systolic murmur LSB ABD:  Soft, nontender. BS +, no masses or bruits. No hepatomegaly, no splenomegaly EXT:  2 + pulses throughout, tr  edema, no cyanosis no clubbing SKIN:  Warm and dry.  No rashes NEURO:  Alert and oriented x 3. Cranial nerves II through XII intact. PSYCH:  Cognitively intact      Wt Readings from Last 3 Encounters:  04/16/20 233 lb 4.8 oz (105.8 kg)  02/15/20 233 lb 12.8 oz (106.1 kg)  12/26/19 231 lb 4 oz (104.9 kg)      Studies/Labs Reviewed:   EKG:  EKG is not ordered today.   Recent Labs: 12/26/2019: TSH 2.18 04/12/2020: ALT 26; BUN 20; Creatinine 1.40; Hemoglobin 15.4; Platelet Count 141; Potassium 4.4; Sodium 140   Lipid Panel    Component Value Date/Time   CHOL 207 (H) 12/26/2019 1150   CHOL 212 (H) 04/21/2019 0849   TRIG 117 12/26/2019 1150   TRIG 108 05/20/2006 0825   HDL 49 12/26/2019 1150   HDL 49 04/21/2019 0849    CHOLHDL 4.2 12/26/2019 1150   VLDL 26.0 12/13/2018 1101   LDLCALC 136 (H) 12/26/2019 1150   LDLDIRECT 177.0 07/24/2015 1052    Additional studies/ records that were reviewed today include:    Cath 08/11/2016 Conclusion     Prox LAD to Mid LAD lesion, 30 %stenosed.  Mid LAD to Dist LAD lesion, 65 %stenosed.  Prox RCA to Mid RCA lesion, 15 %stenosed.  The left ventricular systolic function is normal.  LV end diastolic pressure is normal.  The left ventricular ejection fraction is 55-65% by visual estimate.  LV end diastolic pressure is normal.   1. Borderline single vessel obstructive CAD involving the mid LAD. FFR 0.81 2. Normal LV function EF 65% 3. Normal right heart and LV filling pressures 4. Normal Cardiac output  Plan: will refer to CT surgery for consideration of MV repair. The stenosis in the LAD will be discussed. He is asymptomatic and I would favor treating it medically. If he develops symptoms in the future it could be treated with PCI.        Echo 02/15/2017 LV EF: 60% -   65%  Study Conclusions  - Left ventricle: The cavity size was normal. Wall thickness was   normal. Systolic function was normal. The estimated ejection   fraction was in the range of 60% to 65%. - Aortic valve: AV is thickened, calcified with minimally   restricted motion. - Mitral valve: Prolapse fo the posterior mitral leaflet. MR is at   least moderate in intensity Calcified annulus. Mildly thickened   leaflets . - Left atrium:  The atrium was mildly dilated.    CABG with MVR 03/24/2017 Preoperative Diagnosis:        Severe Mitral Regurgitation  Single-vessel Coronary Artery Disease  Postoperative Diagnosis:    Same  Procedure:        Mitral Valve Repair             Complex valvuloplasty including artificial Gore-tex neochord placement x6             Sorin Carbomedics Annuloflex posterior annuloplasty band (size 2mm, catalog #[id number], serial  #[id number])   Coronary Artery Bypass Grafting x 1              Left Internal Mammary Artery to Distal Left Anterior Descending Coronary Artery   Echo 06/10/17: Study Conclusions  - Left ventricle: The cavity size was normal. There was moderate   concentric hypertrophy. Systolic function was vigorous. The   estimated ejection fraction was in the range of 65% to 70%. Wall   motion was normal; there were no regional wall motion   abnormalities. Doppler parameters are consistent with abnormal   left ventricular relaxation (grade 1 diastolic dysfunction). - Aortic valve: Trileaflet; mildly thickened, mildly calcified   leaflets. Transvalvular velocity was minimally increased. There   was no stenosis. There was no regurgitation. - Mitral valve: S/P mitral valve repair with fixed posterior   leaflet. Transvalvular velocity was elevated. There was no   regurgitation. - Right ventricle: The cavity size was normal. Wall thickness was   normal. Systolic function was normal. - Tricuspid valve: There was no regurgitation. - Pericardium, extracardiac: A mild pericardial effusion was   identified posterior to the heart. Features were not consistent   with tamponade physiology.  Impressions:  - Since the last study on 02/15/2017 mitral valve is post repair.   Transmitral velocities are elevated with mean gradient 8 mmHg.   LVEF is hyperdynamic.  Echo 02/07/18: Study Conclusions  - Left ventricle: The cavity size was mildly reduced. Wall   thickness was increased in a pattern of mild LVH. Systolic   function was vigorous. The estimated ejection fraction was in the   range of 65% to 70%. - Mitral valve: s/p MV repair. Motion is difficult to see due to   poor acoustic windows Peak and mean gradiaeants through the valve   are 7 and 4 mm Hg respectively. MVA by P T1/2 is 1.75 cm2   consistent with mild MS - Right ventricle: RV is difficult to visualize, even with Definity   OVerall RVEF  appears mildly reduced. The cavity size was mildly   dilated. - Right atrium: The atrium was mildly dilated.  Echo 01/29/19: IMPRESSIONS    1. Left ventricular ejection fraction, by visual estimation, is 50 to  55%. The left ventricle has normal function. Normal left ventricular size.  Left ventricular septal wall thickness was mildly increased. Mildly  increased left ventricular posterior  wall thickness. There is mildly increased left ventricular hypertrophy.  2. Elevated left ventricular end-diastolic pressure.  3. Left ventricular diastolic Doppler parameters are consistent with  pseudonormalization pattern of LV diastolic filling.  4. Global right ventricle has normal systolic function.The right  ventricular size is normal. No increase in right ventricular wall  thickness.  5. Left atrial size was normal.  6. Right atrial size was normal.  7. The mitral valve is normal in structure. Mild mitral valve  regurgitation. Mild mitral stenosis.  8. The tricuspid valve is normal in structure. Tricuspid valve  regurgitation is  mild.  9. The aortic valve is normal in structure. Aortic valve regurgitation  was not visualized by color flow Doppler. Structurally normal aortic  valve, with no evidence of sclerosis or stenosis.  10. Mild thickening and calcification of the aortic valve leaflets.  11. The pulmonic valve was normal in structure. Pulmonic valve  regurgitation is trivial by color flow Doppler.  12. Normal pulmonary artery systolic pressure.  13. The inferior vena cava is normal in size with greater than 50%  respiratory variability, suggesting right atrial pressure of 3 mmHg.   ASSESSMENT:    No diagnosis found.   PLAN:  In order of problems listed above:  1. Severe MR s/p mitral valve annuloplasty/repair:  F/U Echo showed an excellent repair.  Recommend routine SBE prophylaxis.    2.   History of PE now recurrent on 02/06/18. Submassive with RV strain. S/p EKOS  and lytic therapy. On Xarelto now.  Will need anticoagulation indefinitely.   3.    Chronic diastolic heart failure: weight is stable.  He does have chronic  Edema more related to venous insuffieciency. Continue low dose diuretic.  Continue sodium restriction and support hose.   4.    S/p CABG x 1: LIMA to LAD  5.    Stage IV lung CA. S/p resection with recurrent malignant pleural effusion. On oral chemotherapy. Scans in April showed no progression.  6.   DM 2: Managed by primary care provider  7.   HLD off statin due to history of elevated CK on Lipitor. Focusing on lifestyle modification for now.   8. CKD stage 3a. Followed by Nephrology.   9. Elevated CPK. Not on statin. Unclear cause of persistently elevated CK. Recommend he discuss with primary care.    Medication Adjustments/Labs and Tests Ordered: Current medicines are reviewed at length with the patient today.  Concerns regarding medicines are outlined above.  Medication changes, Labs and Tests ordered today are listed in the Patient Instructions below. There are no Patient Instructions on file for this visit.   Signed, Kiki Bivens Martinique, MD  05/05/2020 7:27 AM    [facility] [place], [place], [place]  [address] Phone: [phone]; Fax: [phone]

## 2020-05-06 ENCOUNTER — Encounter: Payer: Self-pay | Admitting: Internal Medicine

## 2020-05-06 ENCOUNTER — Ambulatory Visit: Payer: Medicare PPO | Admitting: Cardiology

## 2020-05-06 DIAGNOSIS — U071 COVID-19: Secondary | ICD-10-CM | POA: Diagnosis not present

## 2020-05-07 ENCOUNTER — Encounter: Payer: Self-pay | Admitting: Internal Medicine

## 2020-05-07 ENCOUNTER — Other Ambulatory Visit: Payer: Self-pay | Admitting: Medical Oncology

## 2020-05-07 ENCOUNTER — Telehealth: Payer: Self-pay | Admitting: Medical Oncology

## 2020-05-07 ENCOUNTER — Inpatient Hospital Stay (HOSPITAL_BASED_OUTPATIENT_CLINIC_OR_DEPARTMENT_OTHER): Payer: Medicare PPO | Admitting: Internal Medicine

## 2020-05-07 DIAGNOSIS — Z5111 Encounter for antineoplastic chemotherapy: Secondary | ICD-10-CM | POA: Diagnosis not present

## 2020-05-07 DIAGNOSIS — C3491 Malignant neoplasm of unspecified part of right bronchus or lung: Secondary | ICD-10-CM | POA: Diagnosis not present

## 2020-05-07 NOTE — Telephone Encounter (Signed)
Called to get COVID test result emailed to me.  COVID test result faxed to [facility] tp see if pt eligible for MAB infusion. Fax [phone]

## 2020-05-07 NOTE — Telephone Encounter (Signed)
Called MAB clinic to review pt for MAB infusion.

## 2020-05-07 NOTE — Progress Notes (Signed)
[place] [phone]   Fax:([phone]) [phone]  PROGRESS NOTE FOR TELEMEDICINE VISITS  James Lima, MD [place] [address]  I connected with@ on 05/07/20 at 11:15 AM EST by video enabled telemedicine visit and verified that I am speaking with the correct person using two identifiers.   I discussed the limitations, risks, security and privacy concerns of performing an evaluation and management service by telemedicine and the availability of in-person appointments. I also discussed with the patient that there may be a patient responsible charge related to this service. The patient expressed understanding and agreed to proceed.  Other persons participating in the visit and their role in the encounter:  wife  Patient's location:  home Provider's location:  [place]  DIAGNOSIS: Recurrent non-small cell lung cancer, adenocarcinoma initially diagnosed as stage IA(T1c, N0, M0)non-small cell lung cancer, adenocarcinoma presented with right upper lobe lung nodule in April 2018 with recurrence in June 2019.  Biomarker Findings Microsatellite status - MS-Stable Tumor Mutational Burden - TMB-Low (3 Muts/Mb) Genomic Findings For a complete list of the genes assayed, please refer to the Appendix. EGFR exon 19 deletion ([id number]) CDKN2A/B loss NKX2-1 amplification 7 Disease relevant genes with no reportable alterations: KRAS, ALK, BRAF, MET, RET, ERBB2, ROS1   PRIOR THERAPY: Status post right upper lobectomy with lymph node dissection under the care of Dr. [name] on 09/02/2016.  CURRENT THERAPY: Tagrisso 80 mg p.o. daily.  First dose started November 05, 2017.  Status post 30 months of treatment.  INTERVAL HISTORY: James [name] 69 y.o. male has a MyChart virtual visit with me today for evaluation and discussion of his discuss results and treatment options.  The patient is currently at home because of recent diagnosis  with Covid 19.  He is fully vaccinated and also received his booster shot.  He has headache as well as nasal congestion, sore throat and low-grade fever.  He denied having any current chest pain, shortness of breath, cough or hemoptysis.  He has no nausea, vomiting, diarrhea or constipation.  He had a PET scan performed recently for evaluation of suspicious new pulmonary nodule.  We are having the visit for discussion of the scan results and recommendation regarding his condition.  MEDICAL HISTORY: Past Medical History:  Diagnosis Date  . Adenocarcinoma of right lung, stage 1 ([place]) 09/06/2016  . Anxiety   . Arthritis    "knees, hips, back" (10/19/2012)  . Chronic diastolic congestive heart failure ([place])   . Chronic lower back pain   . Colon polyps    10/27/2002, repeat letter 09/17/2007  . Coronary artery disease   . Coronary artery disease involving native coronary artery of native heart without angina pectoris   . Depressive disorder, not elsewhere classified    no meds  . Diabetes mellitus without complication ([place])    diet controlled- no med  (while in hosp 4/18 -elevated cbg  . Dyspnea   . Fasting hyperglycemia   . GERD (gastroesophageal reflux disease)   . Heart murmur   . Hemoptysis    abnormal CT Chest 01/29/10 - ? new GG changes RUL > not viz on plain cxr 02/26/2010  . Hypertension   . Mitral regurgitation    severe MR 08/2016  . MPN (myeloproliferative neoplasm) ([place])    1st detected 06/04/1998  . Obesity   . OSA on CPAP    last sleep study 10 years ago  . Other and unspecified hyperlipidemia   . Peripheral  vascular disease ([place]) 08/2016   after lung surgey small clots in lungs,after hip dvt-5/16  . Pneumonia    4/18  . Positive PPD 1965   "non reactive in 2012" (10/19/2012)  . Routine general medical examination at a health care facility   . S/P CABG x 1 03/24/2017   [name] to LAD  . S/P mitral valve repair 03/24/2017   Complex valvuloplasty including artificial Gore-tex  neochord placement x6 and 28 mm Sorin Annuloflex posterior annuloplasty band  . Special screening for malignant neoplasm of prostate   . Spinal stenosis, unspecified region other than cervical   . Wrist pain, left     ALLERGIES:  is allergic to symbicort [budesonide-formoterol fumarate] and amoxicillin.  MEDICATIONS:  Current Outpatient Medications  Medication Sig Dispense Refill  . ezetimibe (ZETIA) 10 MG tablet Take 1 tablet (10 mg total) by mouth daily. 90 tablet 3  . furosemide (LASIX) 20 MG tablet Take 0.5 tablets (10 mg total) by mouth daily. Take 1/2 tablet 10 mg daily 45 tablet 3  . metoprolol tartrate (LOPRESSOR) 25 MG tablet Take 0.5 tablets (12.5 mg total) by mouth 2 (two) times daily. 90 tablet 3  . Multiple Vitamin (MULTIVITAMIN) tablet Take 1 tablet by mouth daily.    . Omega-3 Fatty Acids (FISH OIL) 500 MG CAPS Take 500 mg by mouth daily.     . rivaroxaban (XARELTO) 20 MG TABS tablet Take 1 tablet (20 mg total) by mouth daily with supper. 90 tablet 3  . sildenafil (REVATIO) 20 MG tablet Take 4 tablets (80 mg total) by mouth daily as needed. 60 tablet 5  . TAGRISSO 80 MG tablet TAKE 1 TABLET (80 MG TOTAL) BY MOUTH DAILY. 30 tablet 2   No current facility-administered medications for this visit.    SURGICAL HISTORY:  Past Surgical History:  Procedure Laterality Date  . ANTERIOR CRUCIATE LIGAMENT REPAIR Left 1967  . CARDIAC CATHETERIZATION  2000  . CHEST TUBE INSERTION Right 10/19/2012   post bronch  . COLONOSCOPY W/ POLYPECTOMY    . CORONARY ARTERY BYPASS GRAFT N/A 03/24/2017   Procedure: CORONARY ARTERY BYPASS GRAFTING (CABG)x1 using left internal mammary artery, [name]-LAD;  Surgeon: Rexene Alberts, MD;  Location: [facility];  Service: Open Heart Surgery;  Laterality: N/A;  . FLEXIBLE BRONCHOSCOPY  10/19/2012   Flexible video fiberoptic bronchoscopy with electromagnetic navigation and biopsies.  . INTRAVASCULAR PRESSURE WIRE/FFR STUDY N/A 08/11/2016   Procedure:  Intravascular Pressure Wire/FFR Study;  Surgeon: [name], MD;  Location: [facility];  Service: Cardiovascular;  Laterality: N/A;  . IR ANGIOGRAM PULMONARY BILATERAL SELECTIVE  02/06/2018  . IR ANGIOGRAM SELECTIVE EACH ADDITIONAL VESSEL  02/06/2018  . IR ANGIOGRAM SELECTIVE EACH ADDITIONAL VESSEL  02/06/2018  . IR INFUSION THROMBOL ARTERIAL INITIAL (MS)  02/06/2018  . IR INFUSION THROMBOL ARTERIAL INITIAL (MS)  02/06/2018  . IR THROMB F/U EVAL ART/VEN FINAL DAY (MS)  02/07/2018  . IR US GUIDE VASC ACCESS RIGHT  02/06/2018  . LOBECTOMY Right 09/02/2016   Procedure: RIGHT UPPER LOBECTOMY;  Surgeon: Melrose Nakayama, MD;  Location: [facility];  Service: Thoracic;  Laterality: Right;  . LYMPH NODE DISSECTION Right 09/02/2016   Procedure: LYMPH NODE DISSECTION, RIGHT LUNG;  Surgeon: Melrose Nakayama, MD;  Location: [facility];  Service: Thoracic;  Laterality: Right;  . MITRAL VALVE REPAIR N/A 03/24/2017   Procedure: MITRAL VALVE REPAIR (MVR) with Sorin [employer]omedics Annuloflex size 28;  Surgeon: Rexene Alberts, MD;  Location: [facility];  Service: Open Heart Surgery;  Laterality: N/A;  . RIGHT/LEFT HEART CATH AND CORONARY ANGIOGRAPHY N/A 08/11/2016   Procedure: Right/Left Heart Cath and Coronary Angiography;  Surgeon: [name], MD;  Location: [facility];  Service: Cardiovascular;  Laterality: N/A;  . TEE WITHOUT CARDIOVERSION N/A 07/17/2016   Procedure: TRANSESOPHAGEAL ECHOCARDIOGRAM (TEE);  Surgeon: [name], MD;  Location: [facility];  Service: Cardiovascular;  Laterality: N/A;  . TEE WITHOUT CARDIOVERSION N/A 03/24/2017   Procedure: TRANSESOPHAGEAL ECHOCARDIOGRAM (TEE);  Surgeon: Rexene Alberts, MD;  Location: [facility];  Service: Open Heart Surgery;  Laterality: N/A;  . TONSILLECTOMY  1950's  . TOTAL HIP ARTHROPLASTY Left 10/05/2014   dr [name]  . TOTAL HIP ARTHROPLASTY Left 10/05/2014   Procedure: LEFT TOTAL HIP ARTHROPLASTY ANTERIOR APPROACH;  Surgeon: [name], MD;   Location: [facility];  Service: Orthopedics;  Laterality: Left;  Marland Kitchen VIDEO ASSISTED THORACOSCOPY (VATS)/WEDGE RESECTION Right 09/02/2016   Procedure: VIDEO ASSISTED THORACOSCOPY (VATS)/RIGHT UPPER LOBE WEDGE RESECTION;  Surgeon: Melrose Nakayama, MD;  Location: [facility];  Service: Thoracic;  Laterality: Right;  Marland Kitchen VIDEO BRONCHOSCOPY WITH ENDOBRONCHIAL NAVIGATION N/A 10/19/2012   Procedure: VIDEO BRONCHOSCOPY WITH ENDOBRONCHIAL NAVIGATION;  Surgeon: [name], MD;  Location: [facility];  Service: Thoracic;  Laterality: N/A;  . WRIST RECONSTRUCTION Left 12/2009   'proximal row carpectomy" [name]    REVIEW OF SYSTEMS:  Constitutional: positive for fatigue and fevers Eyes: negative Ears, nose, mouth, throat, and face: positive for nasal congestion Respiratory: negative Cardiovascular: negative Gastrointestinal: negative Genitourinary:negative Integument/breast: negative Hematologic/lymphatic: negative Musculoskeletal:negative Neurological: negative Behavioral/Psych: negative Endocrine: negative Allergic/Immunologic: negative    LABORATORY DATA: Lab Results  Component Value Date   WBC 7.2 04/12/2020   HGB 15.4 04/12/2020   HCT 46.1 04/12/2020   MCV 93.7 04/12/2020   PLT 141 (L) 04/12/2020      Chemistry      Component Value Date/Time   NA 140 04/12/2020 0923   NA 142 10/01/2016 1534   K 4.4 04/12/2020 0923   K 4.2 10/01/2016 1534   CL 108 04/12/2020 0923   CO2 22 04/12/2020 0923   CO2 28 10/01/2016 1534   BUN 20 04/12/2020 0923   BUN 15.9 10/01/2016 1534   CREATININE 1.40 (H) 04/12/2020 0923   CREATININE 1.10 02/08/2017 1101   CREATININE 1.5 (H) 10/01/2016 1534      Component Value Date/Time   CALCIUM 9.5 04/12/2020 0923   CALCIUM 9.6 10/01/2016 1534   ALKPHOS 53 04/12/2020 0923   ALKPHOS 65 10/01/2016 1534   AST 28 04/12/2020 0923   AST 20 10/01/2016 1534   ALT 26 04/12/2020 0923   ALT 24 10/01/2016 1534   BILITOT 0.7 04/12/2020 0923   BILITOT 0.46 10/01/2016 1534        RADIOGRAPHIC STUDIES: CT Chest W Contrast  Result Date: 04/13/2020 CLINICAL DATA:  Non-small-cell lung cancer.  Restaging. EXAM: CT CHEST, ABDOMEN AND PELVIS WITHOUT CONTRAST TECHNIQUE: Multidetector CT imaging of the chest, abdomen and pelvis was performed following the standard protocol without IV contrast. COMPARISON:  12/15/2019 FINDINGS: CT CHEST FINDINGS Cardiovascular: The heart size is normal. No substantial pericardial effusion. Coronary artery calcification is evident. Atherosclerotic calcification is noted in the wall of the thoracic aorta. Mediastinum/Nodes: No mediastinal lymphadenopathy. There is no hilar lymphadenopathy. The esophagus has normal imaging features. There is no axillary lymphadenopathy. Lungs/Pleura: Status post right upper lobectomy. The 9 x 4 mm perifissural medial right lower lobe nodule identified as new on the previous study  is progressive in the interval measuring 13 x 8 mm on image 85/4 today. 4 mm peripheral right lower lobe nodule on 62/4 is stable. Adjacent anterior left upper lobe nodules each measuring about 3 mm on images 35 and 37 of series 4 are stable in the interval. Peripheral scarring in the posterior left base with associated calcification is stable. Tiny peripheral calcified granulomata are noted bilaterally. No pleural effusion. Musculoskeletal: No worrisome lytic or sclerotic osseous abnormality. CT ABDOMEN PELVIS FINDINGS Hepatobiliary: Stable appearance septated cyst posterior right liver. Subcapsular cyst in the lateral segment left liver is also unchanged. Tiny hypodensity in the subcapsular right hepatic dome is stable. Gallbladder is nondistended. No intrahepatic or extrahepatic biliary dilation. Pancreas: No focal mass lesion. No dilatation of the main duct. No intraparenchymal cyst. No peripancreatic edema. Spleen: No splenomegaly. No focal mass lesion. Adrenals/Urinary Tract: No adrenal nodule or mass. Right kidney unremarkable. Stable 2.7 cm  cyst lower pole left kidney with central sinus cysts also noted in the left kidney. No evidence for hydroureter. The urinary bladder appears normal for the degree of distention. Stomach/Bowel: Stomach is unremarkable. No gastric wall thickening. No evidence of outlet obstruction. Duodenum is normally positioned as is the ligament of Treitz. No small bowel wall thickening. No small bowel dilatation. The terminal ileum is normal. The appendix is normal. No gross colonic mass. No colonic wall thickening. Vascular/Lymphatic: There is abdominal aortic atherosclerosis without aneurysm. There is no gastrohepatic or hepatoduodenal ligament lymphadenopathy. No retroperitoneal or mesenteric lymphadenopathy. No pelvic sidewall lymphadenopathy. Reproductive: Prostate gland is enlarged. Other: No intraperitoneal free fluid. Musculoskeletal: Left hip replacement. Degenerative changes noted right hip. No worrisome lytic or sclerotic osseous abnormality. IMPRESSION: 1. Interval progression of the perifissural medial right lower lobe pulmonary nodule, now measuring 13 x 8 mm. Imaging features are concerning for recurrent disease. Close follow-up recommended. PET-CT may prove helpful to further evaluate as clinically warranted. 2. No evidence for metastatic disease in the abdomen or pelvis. 3. Tiny noncalcified bilateral pulmonary nodules are stable and associated with scattered bilateral tiny peripheral calcified granulomata. 4. Status post right upper lobectomy. 5. Stable appearance hepatic and left renal cysts. 6. Prostatomegaly. 7. Aortic Atherosclerosis (ICD10-I70.0). Electronically Signed   By: Misty Stanley M.D.   On: 04/13/2020 15:18   CT Abdomen Pelvis W Contrast  Result Date: 04/13/2020 CLINICAL DATA:  Non-small-cell lung cancer.  Restaging. EXAM: CT CHEST, ABDOMEN AND PELVIS WITHOUT CONTRAST TECHNIQUE: Multidetector CT imaging of the chest, abdomen and pelvis was performed following the standard protocol without IV  contrast. COMPARISON:  12/15/2019 FINDINGS: CT CHEST FINDINGS Cardiovascular: The heart size is normal. No substantial pericardial effusion. Coronary artery calcification is evident. Atherosclerotic calcification is noted in the wall of the thoracic aorta. Mediastinum/Nodes: No mediastinal lymphadenopathy. There is no hilar lymphadenopathy. The esophagus has normal imaging features. There is no axillary lymphadenopathy. Lungs/Pleura: Status post right upper lobectomy. The 9 x 4 mm perifissural medial right lower lobe nodule identified as new on the previous study is progressive in the interval measuring 13 x 8 mm on image 85/4 today. 4 mm peripheral right lower lobe nodule on 62/4 is stable. Adjacent anterior left upper lobe nodules each measuring about 3 mm on images 35 and 37 of series 4 are stable in the interval. Peripheral scarring in the posterior left base with associated calcification is stable. Tiny peripheral calcified granulomata are noted bilaterally. No pleural effusion. Musculoskeletal: No worrisome lytic or sclerotic osseous abnormality. CT ABDOMEN PELVIS FINDINGS Hepatobiliary: Stable appearance septated  cyst posterior right liver. Subcapsular cyst in the lateral segment left liver is also unchanged. Tiny hypodensity in the subcapsular right hepatic dome is stable. Gallbladder is nondistended. No intrahepatic or extrahepatic biliary dilation. Pancreas: No focal mass lesion. No dilatation of the main duct. No intraparenchymal cyst. No peripancreatic edema. Spleen: No splenomegaly. No focal mass lesion. Adrenals/Urinary Tract: No adrenal nodule or mass. Right kidney unremarkable. Stable 2.7 cm cyst lower pole left kidney with central sinus cysts also noted in the left kidney. No evidence for hydroureter. The urinary bladder appears normal for the degree of distention. Stomach/Bowel: Stomach is unremarkable. No gastric wall thickening. No evidence of outlet obstruction. Duodenum is normally positioned  as is the ligament of Treitz. No small bowel wall thickening. No small bowel dilatation. The terminal ileum is normal. The appendix is normal. No gross colonic mass. No colonic wall thickening. Vascular/Lymphatic: There is abdominal aortic atherosclerosis without aneurysm. There is no gastrohepatic or hepatoduodenal ligament lymphadenopathy. No retroperitoneal or mesenteric lymphadenopathy. No pelvic sidewall lymphadenopathy. Reproductive: Prostate gland is enlarged. Other: No intraperitoneal free fluid. Musculoskeletal: Left hip replacement. Degenerative changes noted right hip. No worrisome lytic or sclerotic osseous abnormality. IMPRESSION: 1. Interval progression of the perifissural medial right lower lobe pulmonary nodule, now measuring 13 x 8 mm. Imaging features are concerning for recurrent disease. Close follow-up recommended. PET-CT may prove helpful to further evaluate as clinically warranted. 2. No evidence for metastatic disease in the abdomen or pelvis. 3. Tiny noncalcified bilateral pulmonary nodules are stable and associated with scattered bilateral tiny peripheral calcified granulomata. 4. Status post right upper lobectomy. 5. Stable appearance hepatic and left renal cysts. 6. Prostatomegaly. 7. Aortic Atherosclerosis (ICD10-I70.0). Electronically Signed   By: Misty Stanley M.D.   On: 04/13/2020 15:18   NM PET Image Restage (PS) Skull Base to Thigh  Result Date: 04/30/2020 CLINICAL DATA:  Subsequent treatment strategy for non-small cell lung cancer. Right upper lobectomy in April 2019. EXAM: NUCLEAR MEDICINE PET SKULL BASE TO THIGH TECHNIQUE: 11.4 mCi F-18 FDG was injected intravenously. Full-ring PET imaging was performed from the skull base to thigh after the radiotracer. CT data was obtained and used for attenuation correction and anatomic localization. Fasting blood glucose: 104 mg/dl COMPARISON:  Multiple exams, including CT from 04/12/2020 and prior PET-CT from 10/25/2017 FINDINGS:  Mediastinal blood pool activity: SUV max 2.2 Liver activity: SUV max NA NECK: No significant abnormal hypermetabolic activity in this region. Incidental CT findings: none CHEST: The perifissural nodule of concern on recent CT measures 1.0 by 0.6 cm on image 41 of series 8, maximum SUV 1.4 The tiny left upper lobe nodule seen on recent CT are faintly appreciable for example on image 22 of series 8, but are not hypermetabolic and are technically too small to characterize. The 0.4 cm nodule peripherally in the right lung on image 35 of series 8 is likewise not appreciably hypermetabolic but too small to characterize. No new nodules are identified. No hypermetabolic or pathologic adenopathy identified. Incidental CT findings: Right upper lobectomy. Trace right pleural fluid. Atherosclerotic aorta. Mitral valve calcifications. Prior CABG. ABDOMEN/PELVIS: Photopenic hepatic cysts. Incidental CT findings: Aortoiliac atherosclerotic vascular disease. Mild proximal sigmoid colon diverticulosis. Prostatomegaly. SKELETON: No significant abnormal hypermetabolic activity in this region. Incidental CT findings: Left hip prosthesis. Bridging spurring of the sacroiliac joints. Lumbar spondylosis and degenerative disc disease. IMPRESSION: 1. The perifissural nodule of concern at the right lung base measures 1.0 by 0.6 cm and has a maximum SUV of only 1.4. Despite this  low level of activity, this lesion has been slowly enlarging, and the possibility of low-grade adenocarcinoma is difficult to exclude in this setting. This would likely be a tricky lesion for percutaneous biopsy. Consider discussion at multidisciplinary cancer conference. 2. Other imaging findings of potential clinical significance: Trace right pleural fluid. Aortic Atherosclerosis (ICD10-I70.0). Mitral valve calcifications. Photopenic hepatic cysts. Mild sigmoid colon diverticulosis. Lumbar spondylosis and degenerative disc disease. Prostatomegaly. Electronically  Signed   By: Van Clines M.D.   On: 04/30/2020 12:42    ASSESSMENT AND PLAN: This is a very pleasant 69 years old white male with a stage Ia non-small cell lung cancer status post right upper lobectomy with lymph node dissection on September 02, 2016. The patient he was on observation since that time. Unfortunately there are some concerning findings with nodular density in the posterior right lower lobe as well as increase and right pleural effusion suspicious for disease recurrence. He underwent ultrasound-guided right thoracentesis and the cytology of the pleural fluid was consistent with recurrent adenocarcinoma. He had molecular studies performed by [facility] 1 that showed positive EGFR mutation with deletion 73. The patient was started on treatment with Tagrisso 80 mg p.o. daily on 11/05/2017.  He is status post 30 months of treatment. The patient was noted on the previous CT scan of the chest to have enlargement of perifissural medial right lower lobe pulmonary nodule.  He had a PET scan performed recently the scan showed very mild activity in this area but still suspicious for low-grade adenocarcinoma. I recommended for the patient to continue his current treatment with Tagrisso with the same dose for now. He will come back for follow-up visit in 1 months for evaluation and repeat blood work. For the recent COVID-19 infection, we will refer the patient to the monoclonal antibody infusion center. He was advised to call immediately if he has any concerning symptoms in the interval. I discussed the assessment and treatment plan with the patient. The patient was provided an opportunity to ask questions and all were answered. The patient agreed with the plan and demonstrated an understanding of the instructions.   The patient was advised to call back or seek an in-person evaluation if the symptoms worsen or if the condition fails to improve as anticipated.  I provided 30 minutes of  face-to-face video visit time during this encounter, and > 50% was spent counseling as documented under my assessment & plan.  Eilleen Kempf, MD 05/07/2020 11:15 AM  Disclaimer: This note was dictated with voice recognition software. Similar sounding words can inadvertently be transcribed and may not be corrected upon review.

## 2020-05-13 ENCOUNTER — Telehealth: Payer: Self-pay | Admitting: Internal Medicine

## 2020-05-13 NOTE — Telephone Encounter (Signed)
[facility]  Caller states her husband was diagnosed with COVID on Monday. His sore throat is severe. His cough has improved but the sore throat has worsen over the last few days.  [facility] advise: Contact PCP when office is open.   Please advise.

## 2020-05-13 NOTE — Telephone Encounter (Signed)
He needs to be evaluated for other causes of sore throat

## 2020-05-15 ENCOUNTER — Telehealth: Payer: Self-pay | Admitting: Medical Oncology

## 2020-05-15 NOTE — Telephone Encounter (Signed)
FU re :referral to Monoclonal infusion center-  Pt stated he never received a call to come in for the infusion.  I told him to let us know if he has worsening cough .

## 2020-05-17 MED FILL — TAGRISSO 80 MG TABLET: 80 | 30 days supply | Qty: 30 | Fill #1

## 2020-05-24 ENCOUNTER — Telehealth: Payer: Self-pay | Admitting: Internal Medicine

## 2020-05-24 NOTE — Telephone Encounter (Signed)
scheduled per LOS and Sch Msg- LVM for patient for 1/16 appointments

## 2020-05-26 NOTE — Progress Notes (Unsigned)
Cardiology Office Note:    Date:  05/29/2020   ID:  James [name], DOB [birth year], MRN [id number]  PCP:  [name], MD  Cardiologist:  Peter Martinique, MD  Electrophysiologist:  None   Referring MD: [name], MD   Chief Complaint: follow-up of CAD, CHF, and s/p MVR  History of Present Illness:    James [name] is a 70 y.o. male with a history of CAD s/p CABG x1 (LIMA to LAD) in 03/2017 severe MR s/p mitral valve repair at time of CABG chronic diastolic CHF, recurrent PE and chronic right lower extremity DVT on Xarelto, hypertension, hyperlipidemia, type 2 diabetes mellitus, CKD stage III, obstructive sleep apnea on CPAP, lung chancer s/p right upper lobe lobectomy in 08/2016, and GERD who is followed by [name] and presents today for routine follow-up.   Patient was referred to Dr Martinique in 07/2016 for further evaluation of a loud heart murmur an dyspnea. Patient had an Echo prior to this visit and report revealed LVEF of 60-65% with normal wall motion and mild MR. [name] personally looked at the Echo images and was concerned about a partial flail posterior leaflet in some apical views as well as more significant MR with some LV and LA enlargement. TEE was ordered for further evaluation. TEE on 07/17/2016 showed LVEF of 60-65% with flail P3 segment of the mitral leaflet with a rupture cord and severe posterolaterally directed MR. Right/left cardiac catheterization was ordered and patient was referred to Dr. Roxy Manns with CT surgery. R/LHC in 08/2016 showed borderline single vessel CAD involving the mid LAD (FFR 0.81) with normal right heart and LV filling pressures. He also had  PET scan in 08/2016 that showed a hypermetabolic solid 2.5ZD anterior right upper lobe pulmonary nodule compatible with prior bronchiogenic carcinoma with non-specific and mild asymmetric hypermetabolism in the left pontine tonsil and left tongue base region. CT surgery recommended lobectomy for his lung mass with  possible CABG with LIMA to LAD and MV repair. 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 Post procedure, he did become very short of breath. Chest CTA showed small PE and he was started on Xarelto. Echo in 02/2017 showed normal LVEF with persistent MR. He eventually underwent CABG x1 (LIMA to LAD and mitral valve repair with Sorin Carbomedics Annuloflex posterior annuloplasty band on 03/24/2017 with Dr. Roxy Manns. He wa discharged on Coumadin instaead of Xarelto and completed 6 months of anticoagulation for provoked PE and then it was stopped.   CT in 12/2017 showed right pleural effusion. He underwent thoracentesis which showed recurrence cancer. He was started on Tagrisso by Dr. Earlie Server. He was then admitted in 01/2018 with a submassive PE. Echo at that showed mild RV strain. He was treated with EKOS with bilateral intra-arterial tPA. He was treated with IV Heparin for 6 days and then ultimately switched to Xarelto. While on heparin he did develop left flank pain, hematuria, and right leg pain/swelling. He was found to have a hematoma of right leg as well as chronic DVT in right peroneal vein.Manson Passey and Oncology were consulted and cleared or transitioning to Xarelto. MRI did show evidence that hematoma was improving. Aspirin was stopped with addition of DOAC to minimize bleeding risk.  At visit with [name] in 01/2019, he noted persistent shortness of breath with exertion as well as chronic lower extremity edema since his PE. Echo was ordered and showed LVEF of 50-55%  with grade 2 diastolic dysfunction. It was felt that CHF was most likely not the main cause of his symptoms. Edema was felt to be more likely due to venous insufficiency post DVT and dyspnea was felt to be more like from a pulmonary component. He was started on Lasix 10mg  daily to help the edema and recommended seeing Pulmonology again. Pulmonology felt that dyspnea was  mostly due to deconditioning.   Patient was last seen by [name] in 10/2019 at which time he was doing well from a cardiac standpoint with no chest pain, increased shortness of breath, or edema.   Patient presents today for follow-up. Here alone. Patient reports doing well since last visit. He was recently diagnosed with COVID after Christmas but thankfully it was a mild course with only sore throat, mild cough, and decrease PO intake. He has recovered. He has chronic shortness of breath and states this has been a little worse since having COVID. However, he thinks this is due to deconditioning and I agree. Before COVID, he was walking 3 miles per day and was doing well with this. Over the last month, he has not been walking at all. He denies any shorntess of breath but does states he occasional has some shortness of breath when bending over which he attributes to his weight. No orthopnea or PND. He has chronic lower extremity edema (left > right) which is relatively stable. Weights are stable at home. No chest pain. No palpitations. He reports one episode of dizziness right before being diagnosed with COVID but no other episodes of lightheadedness/dizziness. No syncope. Compliant with medications. Tolerating Xarelto well - some gum bleeding (felt to be due to Xarelto and thrombocytopenia) but no other abnormal bleeding.  Past Medical History:  Diagnosis Date  . Adenocarcinoma of right lung, stage 1 ([place]) 09/06/2016  . Anxiety   . Arthritis    "knees, hips, back" (10/19/2012)  . Chronic diastolic congestive heart failure ([place])   . Chronic lower back pain   . Colon polyps    10/27/2002, repeat letter 09/17/2007  . Coronary artery disease   . Coronary artery disease involving native coronary artery of native heart without angina pectoris   . Depressive disorder, not elsewhere classified    no meds  . Diabetes mellitus without complication ([place])    diet controlled- no med  (while in hosp 4/18  -elevated cbg  . Dyspnea   . Fasting hyperglycemia   . GERD (gastroesophageal reflux disease)   . Heart murmur   . Hemoptysis    abnormal CT Chest 01/29/10 - ? new GG changes RUL > not viz on plain cxr 02/26/2010  . Hypertension   . Mitral regurgitation    severe MR 08/2016  . MPN (myeloproliferative neoplasm) ([place])    1st detected 06/04/1998  . Obesity   . OSA on CPAP    last sleep study 10 years ago  . Other and unspecified hyperlipidemia   . Peripheral vascular disease ([place]) 08/2016   after lung surgey small clots in lungs,after hip dvt-5/16  . Pneumonia    4/18  . Positive PPD 1965   "non reactive in 2012" (10/19/2012)  . Routine general medical examination at a health care facility   . S/P CABG x 1 03/24/2017   LIMA to LAD  . S/P mitral valve repair 03/24/2017   Complex valvuloplasty including artificial Gore-tex neochord placement x6 and 28 mm Sorin Annuloflex posterior annuloplasty band  . Special screening for malignant neoplasm of prostate   .  Spinal stenosis, unspecified region other than cervical   . Wrist pain, left     Past Surgical History:  Procedure Laterality Date  . ANTERIOR CRUCIATE LIGAMENT REPAIR Left 1967  . CARDIAC CATHETERIZATION  2000  . CHEST TUBE INSERTION Right 10/19/2012   post bronch  . COLONOSCOPY W/ POLYPECTOMY    . CORONARY ARTERY BYPASS GRAFT N/A 03/24/2017   Procedure: CORONARY ARTERY BYPASS GRAFTING (CABG)x1 using left internal mammary artery, LIMA-LAD;  Surgeon: Rexene Alberts, MD;  Location: [place];  Service: Open Heart Surgery;  Laterality: N/A;  . FLEXIBLE BRONCHOSCOPY  10/19/2012   Flexible video fiberoptic bronchoscopy with electromagnetic navigation and biopsies.  . INTRAVASCULAR PRESSURE WIRE/FFR STUDY N/A 08/11/2016   Procedure: Intravascular Pressure Wire/FFR Study;  Surgeon: [name], MD;  Location: [facility] [place] [facility];  Service: Cardiovascular;  Laterality: N/A;  . IR ANGIOGRAM PULMONARY BILATERAL SELECTIVE  02/06/2018  . IR  ANGIOGRAM SELECTIVE EACH ADDITIONAL VESSEL  02/06/2018  . IR ANGIOGRAM SELECTIVE EACH ADDITIONAL VESSEL  02/06/2018  . IR INFUSION THROMBOL ARTERIAL INITIAL (MS)  02/06/2018  . IR INFUSION THROMBOL ARTERIAL INITIAL (MS)  02/06/2018  . IR THROMB F/U EVAL ART/VEN FINAL DAY (MS)  02/07/2018  . IR US GUIDE VASC ACCESS RIGHT  02/06/2018  . LOBECTOMY Right 09/02/2016   Procedure: RIGHT UPPER LOBECTOMY;  Surgeon: Melrose Nakayama, MD;  Location: [facility];  Service: Thoracic;  Laterality: Right;  . LYMPH NODE DISSECTION Right 09/02/2016   Procedure: LYMPH NODE DISSECTION, RIGHT LUNG;  Surgeon: Melrose Nakayama, MD;  Location: [facility];  Service: Thoracic;  Laterality: Right;  . MITRAL VALVE REPAIR N/A 03/24/2017   Procedure: MITRAL VALVE REPAIR (MVR) with Sorin Carbomedics Annuloflex size 28;  Surgeon: Rexene Alberts, MD;  Location: [place];  Service: Open Heart Surgery;  Laterality: N/A;  . RIGHT/LEFT HEART CATH AND CORONARY ANGIOGRAPHY N/A 08/11/2016   Procedure: Right/Left Heart Cath and Coronary Angiography;  Surgeon: [name], MD;  Location: [facility];  Service: Cardiovascular;  Laterality: N/A;  . TEE WITHOUT CARDIOVERSION N/A 07/17/2016   Procedure: TRANSESOPHAGEAL ECHOCARDIOGRAM (TEE);  Surgeon: [name], MD;  Location: [facility];  Service: Cardiovascular;  Laterality: N/A;  . TEE WITHOUT CARDIOVERSION N/A 03/24/2017   Procedure: TRANSESOPHAGEAL ECHOCARDIOGRAM (TEE);  Surgeon: Rexene Alberts, MD;  Location: [place];  Service: Open Heart Surgery;  Laterality: N/A;  . TONSILLECTOMY  1950's  . TOTAL HIP ARTHROPLASTY Left 10/05/2014   dr [name]  . TOTAL HIP ARTHROPLASTY Left 10/05/2014   Procedure: LEFT TOTAL HIP ARTHROPLASTY ANTERIOR APPROACH;  Surgeon: [name], MD;  Location: [place];  Service: Orthopedics;  Laterality: Left;  Marland Kitchen VIDEO ASSISTED THORACOSCOPY (VATS)/WEDGE RESECTION Right 09/02/2016   Procedure: VIDEO ASSISTED THORACOSCOPY (VATS)/RIGHT UPPER LOBE WEDGE RESECTION;   Surgeon: Melrose Nakayama, MD;  Location: [place];  Service: Thoracic;  Laterality: Right;  Marland Kitchen VIDEO BRONCHOSCOPY WITH ENDOBRONCHIAL NAVIGATION N/A 10/19/2012   Procedure: VIDEO BRONCHOSCOPY WITH ENDOBRONCHIAL NAVIGATION;  Surgeon: [name], MD;  Location: [place];  Service: Thoracic;  Laterality: N/A;  . WRIST RECONSTRUCTION Left 12/2009   'proximal row carpectomy" [name]    Current Medications: Current Meds  Medication Sig  . ezetimibe (ZETIA) 10 MG tablet Take 1 tablet (10 mg total) by mouth daily.  . furosemide (LASIX) 20 MG tablet Take 0.5 tablets (10 mg total) by mouth daily. Take 1/2 tablet 10 mg daily (Patient taking differently: Take 10 mg by mouth daily.)  . Multiple  Vitamin (MULTIVITAMIN) tablet Take 1 tablet by mouth daily.  . Omega-3 Fatty Acids (FISH OIL) 500 MG CAPS Take 500 mg by mouth daily.   . rivaroxaban (XARELTO) 20 MG TABS tablet Take 1 tablet (20 mg total) by mouth daily with supper.  . sildenafil (REVATIO) 20 MG tablet Take 4 tablets (80 mg total) by mouth daily as needed.  Marland Kitchen TAGRISSO 80 MG tablet TAKE 1 TABLET (80 MG TOTAL) BY MOUTH DAILY.     Allergies:   Symbicort [budesonide-formoterol fumarate] and Amoxicillin   Social History   Socioeconomic History  . Marital status: Married    Spouse name: Not on file  . Number of children: 3  . Years of education: Not on file  . Highest education level: Not on file  Occupational History  . Occupation: Retired, Financial risk analyst  . Occupation: Retired, Real estate    Comment: Slow  Tobacco Use  . Smoking status: Never Smoker  . Smokeless tobacco: Never Used  Vaping Use  . Vaping Use: Never used  Substance and Sexual Activity  . Alcohol use: Yes    Alcohol/week: 1.0 standard drink    Types: 1 Cans of beer per week    Comment: none since 3 weeks  . Drug use: No  . Sexual activity: Never    Partners: Female  Other Topics Concern  . Not on file  Social History Narrative   HSG, [employer].  Married '73.  2 sons - '74,   '76;  1 daughter -  '77  6 grandchildren.   Work - IT consultant now retired. ACP - not fully discussed.                   Social Determinants of Health   Financial Resource Strain: Not on file  Food Insecurity: Not on file  Transportation Needs: Not on file  Physical Activity: Not on file  Stress: Not on file  Social Connections: Not on file     Family History: The patient's family history includes Heart attack in his father; Heart disease in his father and paternal uncle; Heart failure in his mother. There is no history of Prostate cancer, Colon cancer, Hypertension, Hyperlipidemia, or Diabetes.  ROS:   Please see the history of present illness.    All other systems reviewed and are negative.  EKGs/Labs/Other Studies Reviewed:    The following studies were reviewed today:  Right/Left Cardiac Catheterization 08/11/2016:  Prox LAD to Mid LAD lesion, 30 %stenosed.  Mid LAD to Dist LAD lesion, 65 %stenosed.  Prox RCA to Mid RCA lesion, 15 %stenosed.  The left ventricular systolic function is normal.  LV end diastolic pressure is normal.  The left ventricular ejection fraction is 55-65% by visual estimate.  LV end diastolic pressure is normal.   1. Borderline single vessel obstructive CAD involving the mid LAD. FFR 0.81 2. Normal LV function EF 65% 3. Normal right heart and LV filling pressures 4. Normal Cardiac output  Plan: will refer to CT surgery for consideration of MV repair. The stenosis in the LAD will be discussed. He is asymptomatic and I would favor treating it medically. If he develops symptoms in the future it could be treated with PCI.  _______________  Echocardiogram 02/08/2019: Impressions: 1. Left ventricular ejection fraction, by visual estimation, is 50 to  55%. The left ventricle has normal function. Normal left ventricular size.  Left ventricular septal wall thickness was mildly increased. Mildly   increased left ventricular  posterior  wall thickness. There is mildly increased left ventricular hypertrophy.  2. Elevated left ventricular end-diastolic pressure.  3. Left ventricular diastolic Doppler parameters are consistent with  pseudonormalization pattern of LV diastolic filling.  4. Global right ventricle has normal systolic function.The right  ventricular size is normal. No increase in right ventricular wall  thickness.  5. Left atrial size was normal.  6. Right atrial size was normal.  7. The mitral valve is normal in structure. Mild mitral valve  regurgitation. Mild mitral stenosis.  8. The tricuspid valve is normal in structure. Tricuspid valve  regurgitation is mild.  9. The aortic valve is normal in structure. Aortic valve regurgitation  was not visualized by color flow Doppler. Structurally normal aortic  valve, with no evidence of sclerosis or stenosis.  10. Mild thickening and calcification of the aortic valve leaflets.  11. The pulmonic valve was normal in structure. Pulmonic valve  regurgitation is trivial by color flow Doppler.  12. Normal pulmonary artery systolic pressure.  13. The inferior vena cava is normal in size with greater than 50%  respiratory variability, suggesting right atrial pressure of 3 mmHg.   EKG:  EKG  ordered today. EKG personally reviewed and demonstrates normal sinus rhythm, rate 67 bpm, with PAC. No acute ST/T changes compared to prior tracings..  Recent Labs: 12/26/2019: TSH 2.18 04/12/2020: ALT 26; BUN 20; Creatinine 1.40; Hemoglobin 15.4; Platelet Count 141; Potassium 4.4; Sodium 140  Recent Lipid Panel    Component Value Date/Time   CHOL 207 (H) 12/26/2019 1150   CHOL 212 (H) 04/21/2019 0849   TRIG 117 12/26/2019 1150   TRIG 108 05/20/2006 0825   HDL 49 12/26/2019 1150   HDL 49 04/21/2019 0849   CHOLHDL 4.2 12/26/2019 1150   VLDL 26.0 12/13/2018 1101   LDLCALC 136 (H) 12/26/2019 1150   LDLDIRECT 177.0 07/24/2015 1052     Physical Exam:    Vital Signs: BP (!) 142/66   Pulse 67   Temp (!) 97.5 F (36.4 C)   Ht 5\' 9"  (1.753 m)   Wt 234 lb (106.1 kg)   SpO2 96%   BMI 34.56 kg/m     Wt Readings from Last 3 Encounters:  05/29/20 234 lb (106.1 kg)  04/16/20 233 lb 4.8 oz (105.8 kg)  02/15/20 233 lb 12.8 oz (106.1 kg)     General: 70 y.o. male in no acute distress. HEENT: Normocephalic and atraumatic. Sclera clear. Neck: Supple. No carotid bruits. No JVD. Heart: RRR. Distinct S1 and S2. II-III/VI systolic murmur. No gallops or rubs.  Lungs: No increased work of breathing. Clear to ausculation bilaterally. No wheezes, rhonchi, or rales.  Abdomen: Soft, non-distended, and non-tender to palpation.  Extremities: Trace to 1+ pitting edema of bilateral lower extremities (left leg chronically larger than right). Skin: Warm and dry. Neuro: Alert and oriented x3. No focal deficits. Psych: Normal affect. Responds appropriately.   Assessment:    1. Coronary artery disease involving native coronary artery of native heart without angina pectoris   2. Mitral valve insufficiency, unspecified etiology   3. S/P mitral valve repair   4. Chronic diastolic CHF (congestive heart failure) ([place])   5. Recurrent pulmonary embolism ([place])   6. Primary hypertension   7. Hyperlipidemia, unspecified hyperlipidemia type   8. Type 2 diabetes mellitus with complication, without long-term current use of insulin (H[facility])   9. Stage 3a chronic kidney disease ([place])     Plan:    CAD s/p CABG - S/p  CABG x1 with LIMA to LAD in 03/2017. - No angina.  - Continue beta-blocker.  - No aspirin due to need for anticoagulation.  - Continue Zetia 10mg  daily. No statin due to persistently elevated CK.   Severe MR s/p MVR  - S/p mitral valve repair with Sorin Carbomedics Annuloflex posterior annuloplasty band at time of CABG in 03/2017. - Most recent Echo in 01/2019 showed repaired mitral valve with mild MR and mild MS by observation  with peak gradient of 14.0 mmHg (essentially unchanged from prior Echo in 2019).  Chronic Diastolic CHF - Echo in 07/2949 showed LVEF of 50-55% with grade 2 diastolic dysfunction.  - Chronic mild lower extremity edema (previously felt to be more due to chronic renal insufficiency) but otherwise appears euvolemic. Weight stable at home.  - Continue Lasix 10mg  daily.  - Continue daily weights and sodium restriction. - Patient will let us know if dyspnea worsens. Suspect this is due to deconditioning at this time.  Recurrent PE - 1st episode after right upper lobectomy in 08/2016. Had recurrent submassive PE with mild RV strain in 01/2018 which was treated with EKOS and lytic therapy. - On long-term anticoagulation with Xarelto. Tolerating this well.  Hypertension - BP mildly elevated at 142/66.  - Continue Lopressor 12.5mg  twice daily. - Patient to monitor BP at home and let us know if consistently >130/80.   Hyperlipidemia - Most recent lipid panel in 12/2019: Total Cholesterol 207, Triglycerides 117, HDL 49, LDL 136.  - LDL goal ideally <70 given CAD. - Continue Zetia 10mg  daily.  - Not currently on statin due persistently elevated total CK. Patient is not fasting today but will have him return for fasting lipid panel and will also recheck CK at that time.  Type 2 Diabetes Mellitus - Management per PCP.  CKD Stage III - Baseline creatinine 1.3 to 1.5. - Followed by Nephrology (Dr. Posey Pronto).   Lung Cancer - S/p right upper lobectomy in 08/2016 with recurrent malignant pleural effusion noted in 01/2018. Now on oral chemotherapy.  - Followed by Dr. Earlie Server.    Disposition: Follow up in 6 months with [name].   Medication Adjustments/Labs and Tests Ordered: Current medicines are reviewed at length with the patient today.  Concerns regarding medicines are outlined above.  No orders of the defined types were placed in this encounter.  No orders of the defined types were placed in  this encounter.   There are no Patient Instructions on file for this visit.   Signed, Darreld Mclean, PA-C  05/29/2020 2:13 PM    [facility]

## 2020-05-29 ENCOUNTER — Other Ambulatory Visit: Payer: Self-pay

## 2020-05-29 ENCOUNTER — Encounter: Payer: Self-pay | Admitting: Student

## 2020-05-29 ENCOUNTER — Ambulatory Visit: Payer: Medicare PPO | Admitting: Student

## 2020-05-29 VITALS — BP 142/66 | HR 67 | Temp 97.5°F | Ht 69.0 in | Wt 234.0 lb

## 2020-05-29 DIAGNOSIS — I340 Nonrheumatic mitral (valve) insufficiency: Secondary | ICD-10-CM

## 2020-05-29 DIAGNOSIS — E118 Type 2 diabetes mellitus with unspecified complications: Secondary | ICD-10-CM

## 2020-05-29 DIAGNOSIS — I2699 Other pulmonary embolism without acute cor pulmonale: Secondary | ICD-10-CM

## 2020-05-29 DIAGNOSIS — Z9889 Other specified postprocedural states: Secondary | ICD-10-CM

## 2020-05-29 DIAGNOSIS — I2510 Atherosclerotic heart disease of native coronary artery without angina pectoris: Secondary | ICD-10-CM | POA: Diagnosis not present

## 2020-05-29 DIAGNOSIS — I10 Essential (primary) hypertension: Secondary | ICD-10-CM | POA: Diagnosis not present

## 2020-05-29 DIAGNOSIS — E785 Hyperlipidemia, unspecified: Secondary | ICD-10-CM | POA: Diagnosis not present

## 2020-05-29 DIAGNOSIS — N1831 Chronic kidney disease, stage 3a: Secondary | ICD-10-CM | POA: Diagnosis not present

## 2020-05-29 DIAGNOSIS — I5032 Chronic diastolic (congestive) heart failure: Secondary | ICD-10-CM

## 2020-05-29 NOTE — Patient Instructions (Addendum)
Medication Instructions:  No changes   *If you need a refill on your cardiac medications before your next appointment, please call your pharmacy*   Lab Work: Ck  lipid - fasting If you have labs (blood work) drawn today and your tests are completely normal, you will receive your results only by: Marland Kitchen MyChart Message (if you have MyChart) OR . A paper copy in the mail If you have any lab test that is abnormal or we need to change your treatment, we will call you to review the results.   Testing/Procedures: Not needed   Follow-Up: At [facility], you and your health needs are our priority.  As part of our continuing mission to provide you with exceptional heart care, we have created designated Provider Care Teams.  These Care Teams include your primary Cardiologist (physician) and Advanced Practice Providers (APPs -  Physician Assistants and Nurse Practitioners) who all work together to provide you with the care you need, when you need it.     Your next appointment:   6 month(s)  The format for your next appointment:   In Person  Provider:   Peter Martinique, MD   Other Instructions

## 2020-06-05 ENCOUNTER — Other Ambulatory Visit: Payer: Self-pay

## 2020-06-05 ENCOUNTER — Inpatient Hospital Stay: Payer: Medicare PPO | Admitting: Internal Medicine

## 2020-06-05 ENCOUNTER — Encounter: Payer: Self-pay | Admitting: Internal Medicine

## 2020-06-05 ENCOUNTER — Inpatient Hospital Stay: Payer: Medicare PPO | Attending: Internal Medicine

## 2020-06-05 VITALS — BP 141/80 | HR 70 | Temp 97.6°F | Resp 20 | Ht 69.0 in | Wt 236.0 lb

## 2020-06-05 DIAGNOSIS — C3491 Malignant neoplasm of unspecified part of right bronchus or lung: Secondary | ICD-10-CM | POA: Diagnosis not present

## 2020-06-05 DIAGNOSIS — J90 Pleural effusion, not elsewhere classified: Secondary | ICD-10-CM | POA: Insufficient documentation

## 2020-06-05 DIAGNOSIS — Z5111 Encounter for antineoplastic chemotherapy: Secondary | ICD-10-CM

## 2020-06-05 DIAGNOSIS — Z85118 Personal history of other malignant neoplasm of bronchus and lung: Secondary | ICD-10-CM | POA: Diagnosis not present

## 2020-06-05 DIAGNOSIS — Z79899 Other long term (current) drug therapy: Secondary | ICD-10-CM | POA: Diagnosis not present

## 2020-06-05 DIAGNOSIS — Z8616 Personal history of COVID-19: Secondary | ICD-10-CM | POA: Diagnosis not present

## 2020-06-05 DIAGNOSIS — Z96642 Presence of left artificial hip joint: Secondary | ICD-10-CM | POA: Diagnosis not present

## 2020-06-05 LAB — CBC WITH DIFFERENTIAL (CANCER CENTER ONLY)
Abs Immature Granulocytes: 0.04 10*3/uL (ref 0.00–0.07)
Basophils Absolute: 0 10*3/uL (ref 0.0–0.1)
Basophils Relative: 1 %
Eosinophils Absolute: 0.1 10*3/uL (ref 0.0–0.5)
Eosinophils Relative: 2 %
HCT: 45.5 % (ref 39.0–52.0)
Hemoglobin: 14.9 g/dL (ref 13.0–17.0)
Immature Granulocytes: 1 %
Lymphocytes Relative: 31 %
Lymphs Abs: 2.2 10*3/uL (ref 0.7–4.0)
MCH: 30.7 pg (ref 26.0–34.0)
MCHC: 32.7 g/dL (ref 30.0–36.0)
MCV: 93.8 fL (ref 80.0–100.0)
Monocytes Absolute: 0.8 10*3/uL (ref 0.1–1.0)
Monocytes Relative: 10 %
Neutro Abs: 4.1 10*3/uL (ref 1.7–7.7)
Neutrophils Relative %: 55 %
Platelet Count: 137 10*3/uL — ABNORMAL LOW (ref 150–400)
RBC: 4.85 MIL/uL (ref 4.22–5.81)
RDW: 14.6 % (ref 11.5–15.5)
WBC Count: 7.3 10*3/uL (ref 4.0–10.5)
nRBC: 0 % (ref 0.0–0.2)

## 2020-06-05 LAB — CMP (CANCER CENTER ONLY)
ALT: 31 U/L (ref 0–44)
AST: 25 U/L (ref 15–41)
Albumin: 3.6 g/dL (ref 3.5–5.0)
Alkaline Phosphatase: 55 U/L (ref 38–126)
Anion gap: 7 (ref 5–15)
BUN: 28 mg/dL — ABNORMAL HIGH (ref 8–23)
CO2: 24 mmol/L (ref 22–32)
Calcium: 8.6 mg/dL — ABNORMAL LOW (ref 8.9–10.3)
Chloride: 111 mmol/L (ref 98–111)
Creatinine: 1.42 mg/dL — ABNORMAL HIGH (ref 0.61–1.24)
GFR, Estimated: 53 mL/min — ABNORMAL LOW (ref >60)
Glucose, Bld: 160 mg/dL — ABNORMAL HIGH (ref 70–99)
Potassium: 4.2 mmol/L (ref 3.5–5.1)
Sodium: 142 mmol/L (ref 135–145)
Total Bilirubin: 0.4 mg/dL (ref 0.3–1.2)
Total Protein: 6.7 g/dL (ref 6.5–8.1)

## 2020-06-05 NOTE — Progress Notes (Signed)
[place] Telephone:([phone]   Fax:(336) [phone]  OFFICE PROGRESS NOTE  James Lima, MD [place] [address]  DIAGNOSIS: Recurrent non-small cell lung cancer, adenocarcinoma initially diagnosed as stage IA (T1c, N0, M0) non-small cell lung cancer, adenocarcinoma presented with right upper lobe lung nodule in April 2018 with recurrence in June 2019.  Biomarker Findings Microsatellite status - MS-Stable Tumor Mutational Burden - TMB-Low (3 Muts/Mb) Genomic Findings For a complete list of the genes assayed, please refer to the Appendix. EGFR exon 19 deletion ([id number]) CDKN2A/B loss NKX2-1 amplification 7 Disease relevant genes with no reportable alterations: KRAS, ALK, BRAF, MET, RET, ERBB2, ROS1   PRIOR THERAPY: Status post right upper lobectomy with lymph node dissection under the care of Dr. [name] on 09/02/2016.  CURRENT THERAPY: Tagrisso 80 mg p.o. daily.  First dose started November 05, 2017.  Status post 31 months of treatment.  INTERVAL HISTORY: James [name] 70 y.o. male returns to the clinic today for follow-up visit accompanied by his wife.  The patient is feeling a little bit better today.  He was diagnosed with COVID-19 after Christmas in 2021 and recovering slowly from his symptoms.  He denied having any current chest pain but has shortness of breath at baseline increased with exertion.  He denied having any fever or chills.  He has no nausea, vomiting, diarrhea or constipation.  He continues to tolerate his treatment with Tagrisso fairly well.  He had a PET scan that showed very mild FDG activity and the suspicious perifissural nodule at the right lung base but still concerning for low-grade adenocarcinoma.  The patient is here today for evaluation and repeat blood work.   MEDICAL HISTORY: Past Medical History:  Diagnosis Date  . Adenocarcinoma of right lung, stage 1 ([place]) 09/06/2016  . Anxiety   . Arthritis     "knees, hips, back" (10/19/2012)  . Chronic diastolic congestive heart failure ([place])   . Chronic lower back pain   . Colon polyps    10/27/2002, repeat letter 09/17/2007  . Coronary artery disease   . Coronary artery disease involving native coronary artery of native heart without angina pectoris   . Depressive disorder, not elsewhere classified    no meds  . Diabetes mellitus without complication ([place])    diet controlled- no med  (while in hosp 4/18 -elevated cbg  . Dyspnea   . Fasting hyperglycemia   . GERD (gastroesophageal reflux disease)   . Heart murmur   . Hemoptysis    abnormal CT Chest 01/29/10 - ? new GG changes RUL > not viz on plain cxr 02/26/2010  . Hypertension   . Mitral regurgitation    severe MR 08/2016  . MPN (myeloproliferative neoplasm) ([place])    1st detected 06/04/1998  . Obesity   . OSA on CPAP    last sleep study 10 years ago  . Other and unspecified hyperlipidemia   . Peripheral vascular disease ([place]) 08/2016   after lung surgey small clots in lungs,after hip dvt-5/16  . Pneumonia    4/18  . Positive PPD 1965   "non reactive in 2012" (10/19/2012)  . Routine general medical examination at a health care facility   . S/P CABG x 1 03/24/2017   [name] to LAD  . S/P mitral valve repair 03/24/2017   Complex valvuloplasty including artificial Gore-tex neochord placement x6 and 28 mm Sorin Annuloflex posterior annuloplasty band  . Special screening for malignant neoplasm  of prostate   . Spinal stenosis, unspecified region other than cervical   . Wrist pain, left     ALLERGIES:  is allergic to symbicort [budesonide-formoterol fumarate] and amoxicillin.  MEDICATIONS:  Current Outpatient Medications  Medication Sig Dispense Refill  . ezetimibe (ZETIA) 10 MG tablet Take 1 tablet (10 mg total) by mouth daily. 90 tablet 3  . furosemide (LASIX) 20 MG tablet Take 0.5 tablets (10 mg total) by mouth daily. Take 1/2 tablet 10 mg daily (Patient taking differently: Take 10 mg  by mouth daily.) 45 tablet 3  . metoprolol tartrate (LOPRESSOR) 25 MG tablet Take 0.5 tablets (12.5 mg total) by mouth 2 (two) times daily. 90 tablet 3  . Multiple Vitamin (MULTIVITAMIN) tablet Take 1 tablet by mouth daily.    . Omega-3 Fatty Acids (FISH OIL) 500 MG CAPS Take 500 mg by mouth daily.     . rivaroxaban (XARELTO) 20 MG TABS tablet Take 1 tablet (20 mg total) by mouth daily with supper. 90 tablet 3  . sildenafil (REVATIO) 20 MG tablet Take 4 tablets (80 mg total) by mouth daily as needed. 60 tablet 5  . TAGRISSO 80 MG tablet TAKE 1 TABLET (80 MG TOTAL) BY MOUTH DAILY. 30 tablet 2   No current facility-administered medications for this visit.    SURGICAL HISTORY:  Past Surgical History:  Procedure Laterality Date  . ANTERIOR CRUCIATE LIGAMENT REPAIR Left 1967  . CARDIAC CATHETERIZATION  2000  . CHEST TUBE INSERTION Right 10/19/2012   post bronch  . COLONOSCOPY W/ POLYPECTOMY    . CORONARY ARTERY BYPASS GRAFT N/A 03/24/2017   Procedure: CORONARY ARTERY BYPASS GRAFTING (CABG)x1 using left internal mammary artery, [name]-LAD;  Surgeon: Rexene Alberts, MD;  Location: [facility];  Service: Open Heart Surgery;  Laterality: N/A;  . FLEXIBLE BRONCHOSCOPY  10/19/2012   Flexible video fiberoptic bronchoscopy with electromagnetic navigation and biopsies.  . INTRAVASCULAR PRESSURE WIRE/FFR STUDY N/A 08/11/2016   Procedure: Intravascular Pressure Wire/FFR Study;  Surgeon: [name], MD;  Location: [facility];  Service: Cardiovascular;  Laterality: N/A;  . IR ANGIOGRAM PULMONARY BILATERAL SELECTIVE  02/06/2018  . IR ANGIOGRAM SELECTIVE EACH ADDITIONAL VESSEL  02/06/2018  . IR ANGIOGRAM SELECTIVE EACH ADDITIONAL VESSEL  02/06/2018  . IR INFUSION THROMBOL ARTERIAL INITIAL (MS)  02/06/2018  . IR INFUSION THROMBOL ARTERIAL INITIAL (MS)  02/06/2018  . IR THROMB F/U EVAL ART/VEN FINAL DAY (MS)  02/07/2018  . IR US GUIDE VASC ACCESS RIGHT  02/06/2018  . LOBECTOMY Right 09/02/2016   Procedure:  RIGHT UPPER LOBECTOMY;  Surgeon: Melrose Nakayama, MD;  Location: [facility];  Service: Thoracic;  Laterality: Right;  . LYMPH NODE DISSECTION Right 09/02/2016   Procedure: LYMPH NODE DISSECTION, RIGHT LUNG;  Surgeon: Melrose Nakayama, MD;  Location: [facility];  Service: Thoracic;  Laterality: Right;  . MITRAL VALVE REPAIR N/A 03/24/2017   Procedure: MITRAL VALVE REPAIR (MVR) with Sorin Carbomedics Annuloflex size 28;  Surgeon: Rexene Alberts, MD;  Location: [facility];  Service: Open Heart Surgery;  Laterality: N/A;  . RIGHT/LEFT HEART CATH AND CORONARY ANGIOGRAPHY N/A 08/11/2016   Procedure: Right/Left Heart Cath and Coronary Angiography;  Surgeon: [name], MD;  Location: [facility];  Service: Cardiovascular;  Laterality: N/A;  . TEE WITHOUT CARDIOVERSION N/A 07/17/2016   Procedure: TRANSESOPHAGEAL ECHOCARDIOGRAM (TEE);  Surgeon: [name], MD;  Location: [facility];  Service: Cardiovascular;  Laterality: N/A;  . TEE WITHOUT CARDIOVERSION N/A 03/24/2017  Procedure: TRANSESOPHAGEAL ECHOCARDIOGRAM (TEE);  Surgeon: Rexene Alberts, MD;  Location: [facility];  Service: Open Heart Surgery;  Laterality: N/A;  . TONSILLECTOMY  1950's  . TOTAL HIP ARTHROPLASTY Left 10/05/2014   dr [name]  . TOTAL HIP ARTHROPLASTY Left 10/05/2014   Procedure: LEFT TOTAL HIP ARTHROPLASTY ANTERIOR APPROACH;  Surgeon: [name], MD;  Location: [facility];  Service: Orthopedics;  Laterality: Left;  Marland Kitchen VIDEO ASSISTED THORACOSCOPY (VATS)/WEDGE RESECTION Right 09/02/2016   Procedure: VIDEO ASSISTED THORACOSCOPY (VATS)/RIGHT UPPER LOBE WEDGE RESECTION;  Surgeon: Melrose Nakayama, MD;  Location: [facility];  Service: Thoracic;  Laterality: Right;  Marland Kitchen VIDEO BRONCHOSCOPY WITH ENDOBRONCHIAL NAVIGATION N/A 10/19/2012   Procedure: VIDEO BRONCHOSCOPY WITH ENDOBRONCHIAL NAVIGATION;  Surgeon: [name], MD;  Location: [facility];  Service: Thoracic;  Laterality: N/A;  . WRIST RECONSTRUCTION Left 12/2009   'proximal row carpectomy"  [name]    REVIEW OF SYSTEMS:  A comprehensive review of systems was negative except for: Constitutional: positive for fatigue Respiratory: positive for dyspnea on exertion   PHYSICAL EXAMINATION: General appearance: alert, cooperative, fatigued and no distress Head: Normocephalic, without obvious abnormality, atraumatic Neck: no adenopathy, no JVD, supple, symmetrical, trachea midline and thyroid not enlarged, symmetric, no tenderness/mass/nodules Lymph nodes: Cervical, supraclavicular, and axillary nodes normal. Resp: clear to auscultation bilaterally Back: symmetric, no curvature. ROM normal. No CVA tenderness. Cardio: regular rate and rhythm, S1, S2 normal, no murmur, click, rub or gallop GI: soft, non-tender; bowel sounds normal; no masses,  no organomegaly Extremities: extremities normal, atraumatic, no cyanosis or edema  ECOG PERFORMANCE STATUS: 1 - Symptomatic but completely ambulatory  Blood pressure (!) 141/80, pulse 70, temperature 97.6 F (36.4 C), temperature source Tympanic, resp. rate 20, height 5' 9"  (1.753 m), weight 236 lb (107 kg), SpO2 98 %.  LABORATORY DATA: Lab Results  Component Value Date   WBC 7.3 06/05/2020   HGB 14.9 06/05/2020   HCT 45.5 06/05/2020   MCV 93.8 06/05/2020   PLT 137 (L) 06/05/2020      Chemistry      Component Value Date/Time   NA 142 06/05/2020 0952   NA 142 10/01/2016 1534   K 4.2 06/05/2020 0952   K 4.2 10/01/2016 1534   CL 111 06/05/2020 0952   CO2 24 06/05/2020 0952   CO2 28 10/01/2016 1534   BUN 28 (H) 06/05/2020 0952   BUN 15.9 10/01/2016 1534   CREATININE 1.42 (H) 06/05/2020 0952   CREATININE 1.10 02/08/2017 1101   CREATININE 1.5 (H) 10/01/2016 1534      Component Value Date/Time   CALCIUM 8.6 (L) 06/05/2020 0952   CALCIUM 9.6 10/01/2016 1534   ALKPHOS 55 06/05/2020 0952   ALKPHOS 65 10/01/2016 1534   AST 25 06/05/2020 0952   AST 20 10/01/2016 1534   ALT 31 06/05/2020 0952   ALT 24 10/01/2016 1534   BILITOT 0.4  06/05/2020 0952   BILITOT 0.46 10/01/2016 1534       RADIOGRAPHIC STUDIES: No results found.  ASSESSMENT AND PLAN: This is a very pleasant 70 years old white male with a stage Ia non-small cell lung cancer status post right upper lobectomy with lymph node dissection on September 02, 2016. The patient he was on observation since that time. Unfortunately there are some concerning findings with nodular density in the posterior right lower lobe as well as increase and right pleural effusion suspicious for disease recurrence. He underwent ultrasound-guided right thoracentesis and the cytology of the pleural fluid was consistent with recurrent adenocarcinoma.  He had molecular studies performed by foundation 1 that showed positive EGFR mutation with deletion 24. The patient was started on treatment with Tagrisso 80 mg p.o. daily on 11/05/2017.  He status post 31 months of treatment. The patient continues to tolerate his treatment with Tagrisso fairly well with no concerning complaints. I recommended for him to continue his treatment with Tagrisso with the same dose for now. I will see him back for follow-up visit in 1 months for evaluation and repeat blood work. He was advised to call immediately if he has any other concerning symptoms in the interval. The patient voices understanding of current disease status and treatment options and is in agreement with the current care plan. All questions were answered. The patient knows to call the clinic with any problems, questions or concerns. We can certainly see the patient much sooner if necessary.  Disclaimer: This note was dictated with voice recognition software. Similar sounding words can inadvertently be transcribed and may not be corrected upon review.

## 2020-06-17 MED FILL — TAGRISSO 80 MG TABLET: 80 | 30 days supply | Qty: 30 | Fill #2

## 2020-07-03 ENCOUNTER — Telehealth: Payer: Self-pay | Admitting: Internal Medicine

## 2020-07-03 ENCOUNTER — Inpatient Hospital Stay: Payer: Medicare PPO

## 2020-07-03 ENCOUNTER — Inpatient Hospital Stay: Payer: Medicare PPO | Attending: Internal Medicine | Admitting: Internal Medicine

## 2020-07-03 ENCOUNTER — Other Ambulatory Visit: Payer: Self-pay

## 2020-07-03 VITALS — BP 140/70 | HR 61 | Temp 98.0°F | Resp 14 | Ht 69.0 in | Wt 233.8 lb

## 2020-07-03 DIAGNOSIS — Z5111 Encounter for antineoplastic chemotherapy: Secondary | ICD-10-CM

## 2020-07-03 DIAGNOSIS — D6959 Other secondary thrombocytopenia: Secondary | ICD-10-CM

## 2020-07-03 DIAGNOSIS — J910 Malignant pleural effusion: Secondary | ICD-10-CM

## 2020-07-03 DIAGNOSIS — Z79899 Other long term (current) drug therapy: Secondary | ICD-10-CM | POA: Diagnosis not present

## 2020-07-03 DIAGNOSIS — C3490 Malignant neoplasm of unspecified part of unspecified bronchus or lung: Secondary | ICD-10-CM | POA: Diagnosis not present

## 2020-07-03 DIAGNOSIS — C3411 Malignant neoplasm of upper lobe, right bronchus or lung: Secondary | ICD-10-CM | POA: Insufficient documentation

## 2020-07-03 DIAGNOSIS — C3431 Malignant neoplasm of lower lobe, right bronchus or lung: Secondary | ICD-10-CM

## 2020-07-03 DIAGNOSIS — Z902 Acquired absence of lung [part of]: Secondary | ICD-10-CM | POA: Insufficient documentation

## 2020-07-03 DIAGNOSIS — Z96643 Presence of artificial hip joint, bilateral: Secondary | ICD-10-CM | POA: Insufficient documentation

## 2020-07-03 DIAGNOSIS — C3491 Malignant neoplasm of unspecified part of right bronchus or lung: Secondary | ICD-10-CM

## 2020-07-03 DIAGNOSIS — D696 Thrombocytopenia, unspecified: Secondary | ICD-10-CM | POA: Insufficient documentation

## 2020-07-03 DIAGNOSIS — N289 Disorder of kidney and ureter, unspecified: Secondary | ICD-10-CM

## 2020-07-03 LAB — CMP (CANCER CENTER ONLY)
ALT: 28 U/L (ref 0–44)
AST: 28 U/L (ref 15–41)
Albumin: 3.8 g/dL (ref 3.5–5.0)
Alkaline Phosphatase: 52 U/L (ref 38–126)
Anion gap: 7 (ref 5–15)
BUN: 21 mg/dL (ref 8–23)
CO2: 25 mmol/L (ref 22–32)
Calcium: 9 mg/dL (ref 8.9–10.3)
Chloride: 108 mmol/L (ref 98–111)
Creatinine: 1.46 mg/dL — ABNORMAL HIGH (ref 0.61–1.24)
GFR, Estimated: 51 mL/min — ABNORMAL LOW (ref >60)
Glucose, Bld: 129 mg/dL — ABNORMAL HIGH (ref 70–99)
Potassium: 4.8 mmol/L (ref 3.5–5.1)
Sodium: 140 mmol/L (ref 135–145)
Total Bilirubin: 0.5 mg/dL (ref 0.3–1.2)
Total Protein: 6.8 g/dL (ref 6.5–8.1)

## 2020-07-03 LAB — CBC WITH DIFFERENTIAL (CANCER CENTER ONLY)
Abs Immature Granulocytes: 0.02 10*3/uL (ref 0.00–0.07)
Basophils Absolute: 0 10*3/uL (ref 0.0–0.1)
Basophils Relative: 1 %
Eosinophils Absolute: 0.1 10*3/uL (ref 0.0–0.5)
Eosinophils Relative: 2 %
HCT: 45.1 % (ref 39.0–52.0)
Hemoglobin: 15 g/dL (ref 13.0–17.0)
Immature Granulocytes: 0 %
Lymphocytes Relative: 32 %
Lymphs Abs: 2 10*3/uL (ref 0.7–4.0)
MCH: 30.8 pg (ref 26.0–34.0)
MCHC: 33.3 g/dL (ref 30.0–36.0)
MCV: 92.6 fL (ref 80.0–100.0)
Monocytes Absolute: 0.7 10*3/uL (ref 0.1–1.0)
Monocytes Relative: 10 %
Neutro Abs: 3.5 10*3/uL (ref 1.7–7.7)
Neutrophils Relative %: 55 %
Platelet Count: 142 10*3/uL — ABNORMAL LOW (ref 150–400)
RBC: 4.87 MIL/uL (ref 4.22–5.81)
RDW: 14.7 % (ref 11.5–15.5)
WBC Count: 6.4 10*3/uL (ref 4.0–10.5)
nRBC: 0 % (ref 0.0–0.2)

## 2020-07-03 NOTE — Progress Notes (Signed)
[facility] Telephone:(336) [phone]   Fax:(336) [phone]  OFFICE PROGRESS NOTE  [name], MD [place] [address]  DIAGNOSIS: Recurrent non-small cell lung cancer, adenocarcinoma initially diagnosed as stage IA (T1c, N0, M0) non-small cell lung cancer, adenocarcinoma presented with right upper lobe lung nodule in April 2018 with recurrence in June 2019.  Biomarker Findings Microsatellite status - MS-Stable Tumor Mutational Burden - TMB-Low (3 Muts/Mb) Genomic Findings For a complete list of the genes assayed, please refer to the Appendix. EGFR exon 19 deletion ([id number]) CDKN2A/B loss NKX2-1 amplification 7 Disease relevant genes with no reportable alterations: KRAS, ALK, BRAF, MET, RET, ERBB2, ROS1   PRIOR THERAPY: Status post right upper lobectomy with lymph node dissection under the care of Dr. [name] on 09/02/2016.  CURRENT THERAPY: Tagrisso 80 mg p.o. daily.  First dose started November 05, 2017.  Status post 33 months of treatment.  INTERVAL HISTORY: James [name] 70 y.o. male returns to the clinic today for follow-up visit accompanied by his wife.  The patient is feeling fine today with no concerning complaints except for occasional diarrhea.  He denied having any significant skin rash.  He denied having any chest pain, shortness of breath, cough or hemoptysis.  He has no nausea, vomiting or constipation.  He has no headache or visual changes.  He continues to tolerate his treatment with Tagrisso fairly well.  He is here today for evaluation and repeat blood work.  MEDICAL HISTORY: Past Medical History:  Diagnosis Date  . Adenocarcinoma of right lung, stage 1 ([facility]) 09/06/2016  . Anxiety   . Arthritis    "knees, hips, back" (10/19/2012)  . Chronic diastolic congestive heart failure ([facility])   . Chronic lower back pain   . Colon polyps    10/27/2002, repeat letter 09/17/2007  . Coronary artery disease   . Coronary artery  disease involving native coronary artery of native heart without angina pectoris   . Depressive disorder, not elsewhere classified    no meds  . Diabetes mellitus without complication ([facility])    diet controlled- no med  (while in hosp 4/18 -elevated cbg  . Dyspnea   . Fasting hyperglycemia   . GERD (gastroesophageal reflux disease)   . Heart murmur   . Hemoptysis    abnormal CT Chest 01/29/10 - ? new GG changes RUL > not viz on plain cxr 02/26/2010  . Hypertension   . Mitral regurgitation    severe MR 08/2016  . MPN (myeloproliferative neoplasm) ([facility])    1st detected 06/04/1998  . Obesity   . OSA on CPAP    last sleep study 10 years ago  . Other and unspecified hyperlipidemia   . Peripheral vascular disease ([facility]) 08/2016   after lung surgey small clots in lungs,after hip dvt-5/16  . Pneumonia    4/18  . Positive PPD 1965   "non reactive in 2012" (10/19/2012)  . Routine general medical examination at a health care facility   . S/P CABG x 1 03/24/2017   LIMA to LAD  . S/P mitral valve repair 03/24/2017   Complex valvuloplasty including artificial Gore-tex neochord placement x6 and 28 mm Sorin Annuloflex posterior annuloplasty band  . Special screening for malignant neoplasm of prostate   . Spinal stenosis, unspecified region other than cervical   . Wrist pain, left     ALLERGIES:  is allergic to symbicort [budesonide-formoterol fumarate] and amoxicillin.  MEDICATIONS:  Current Outpatient Medications  Medication Sig Dispense Refill  . ezetimibe (ZETIA) 10 MG tablet Take 1 tablet (10 mg total) by mouth daily. 90 tablet 3  . furosemide (LASIX) 20 MG tablet Take 0.5 tablets (10 mg total) by mouth daily. Take 1/2 tablet 10 mg daily (Patient taking differently: Take 10 mg by mouth daily.) 45 tablet 3  . metoprolol tartrate (LOPRESSOR) 25 MG tablet Take 0.5 tablets (12.5 mg total) by mouth 2 (two) times daily. 90 tablet 3  . Multiple Vitamin (MULTIVITAMIN) tablet Take 1 tablet by mouth  daily.    . Omega-3 Fatty Acids (FISH OIL) 500 MG CAPS Take 500 mg by mouth daily.     . rivaroxaban (XARELTO) 20 MG TABS tablet Take 1 tablet (20 mg total) by mouth daily with supper. 90 tablet 3  . sildenafil (REVATIO) 20 MG tablet Take 4 tablets (80 mg total) by mouth daily as needed. 60 tablet 5  . TAGRISSO 80 MG tablet TAKE 1 TABLET (80 MG TOTAL) BY MOUTH DAILY. 30 tablet 2   No current facility-administered medications for this visit.    SURGICAL HISTORY:  Past Surgical History:  Procedure Laterality Date  . ANTERIOR CRUCIATE LIGAMENT REPAIR Left 1967  . CARDIAC CATHETERIZATION  2000  . CHEST TUBE INSERTION Right 10/19/2012   post bronch  . COLONOSCOPY W/ POLYPECTOMY    . CORONARY ARTERY BYPASS GRAFT N/A 03/24/2017   Procedure: CORONARY ARTERY BYPASS GRAFTING (CABG)x1 using left internal mammary artery, LIMA-LAD;  Surgeon: Rexene Alberts, MD;  Location: [facility];  Service: Open Heart Surgery;  Laterality: N/A;  . FLEXIBLE BRONCHOSCOPY  10/19/2012   Flexible video fiberoptic bronchoscopy with electromagnetic navigation and biopsies.  . INTRAVASCULAR PRESSURE WIRE/FFR STUDY N/A 08/11/2016   Procedure: Intravascular Pressure Wire/FFR Study;  Surgeon: [name], MD;  Location: [facility];  Service: Cardiovascular;  Laterality: N/A;  . IR ANGIOGRAM PULMONARY BILATERAL SELECTIVE  02/06/2018  . IR ANGIOGRAM SELECTIVE EACH ADDITIONAL VESSEL  02/06/2018  . IR ANGIOGRAM SELECTIVE EACH ADDITIONAL VESSEL  02/06/2018  . IR INFUSION THROMBOL ARTERIAL INITIAL (MS)  02/06/2018  . IR INFUSION THROMBOL ARTERIAL INITIAL (MS)  02/06/2018  . IR THROMB F/U EVAL ART/VEN FINAL DAY (MS)  02/07/2018  . IR US GUIDE VASC ACCESS RIGHT  02/06/2018  . LOBECTOMY Right 09/02/2016   Procedure: RIGHT UPPER LOBECTOMY;  Surgeon: Melrose Nakayama, MD;  Location: [facility];  Service: Thoracic;  Laterality: Right;  . LYMPH NODE DISSECTION Right 09/02/2016   Procedure: LYMPH NODE DISSECTION, RIGHT LUNG;  Surgeon:  Melrose Nakayama, MD;  Location: [facility];  Service: Thoracic;  Laterality: Right;  . MITRAL VALVE REPAIR N/A 03/24/2017   Procedure: MITRAL VALVE REPAIR (MVR) with Sorin Carbomedics Annuloflex size 28;  Surgeon: Rexene Alberts, MD;  Location: [facility];  Service: Open Heart Surgery;  Laterality: N/A;  . RIGHT/LEFT HEART CATH AND CORONARY ANGIOGRAPHY N/A 08/11/2016   Procedure: Right/Left Heart Cath and Coronary Angiography;  Surgeon: [name], MD;  Location: [facility];  Service: Cardiovascular;  Laterality: N/A;  . TEE WITHOUT CARDIOVERSION N/A 07/17/2016   Procedure: TRANSESOPHAGEAL ECHOCARDIOGRAM (TEE);  Surgeon: [name], MD;  Location[facility];  Service: Cardiovascular;  Laterality: N/A;  . TEE WITHOUT CARDIOVERSION N/A 03/24/2017   Procedure: TRANSESOPHAGEAL ECHOCARDIOGRAM (TEE);  Surgeon: Rexene Alberts, MD;  Location: [facility];  Service: Open Heart Surgery;  Laterality: N/A;  . TONSILLECTOMY  1950's  . TOTAL HIP ARTHROPLASTY Left 10/05/2014   dr [name]  .  TOTAL HIP ARTHROPLASTY Left 10/05/2014   Procedure: LEFT TOTAL HIP ARTHROPLASTY ANTERIOR APPROACH;  Surgeon: [name], MD;  Location: [facility];  Service: Orthopedics;  Laterality: Left;  Marland Kitchen VIDEO ASSISTED THORACOSCOPY (VATS)/WEDGE RESECTION Right 09/02/2016   Procedure: VIDEO ASSISTED THORACOSCOPY (VATS)/RIGHT UPPER LOBE WEDGE RESECTION;  Surgeon: Melrose Nakayama, MD;  Location: [facility];  Service: Thoracic;  Laterality: Right;  Marland Kitchen VIDEO BRONCHOSCOPY WITH ENDOBRONCHIAL NAVIGATION N/A 10/19/2012   Procedure: VIDEO BRONCHOSCOPY WITH ENDOBRONCHIAL NAVIGATION;  Surgeon: [name], MD;  Location: [facility];  Service: Thoracic;  Laterality: N/A;  . WRIST RECONSTRUCTION Left 12/2009   'proximal row carpectomy" [name]    REVIEW OF SYSTEMS:  A comprehensive review of systems was negative except for: Gastrointestinal: positive for diarrhea   PHYSICAL EXAMINATION: General appearance: alert, cooperative and no distress Head:  Normocephalic, without obvious abnormality, atraumatic Neck: no adenopathy, no JVD, supple, symmetrical, trachea midline and thyroid not enlarged, symmetric, no tenderness/mass/nodules Lymph nodes: Cervical, supraclavicular, and axillary nodes normal. Resp: clear to auscultation bilaterally Back: symmetric, no curvature. ROM normal. No CVA tenderness. Cardio: regular rate and rhythm, S1, S2 normal, no murmur, click, rub or gallop GI: soft, non-tender; bowel sounds normal; no masses,  no organomegaly Extremities: extremities normal, atraumatic, no cyanosis or edema  ECOG PERFORMANCE STATUS: 1 - Symptomatic but completely ambulatory  Blood pressure 140/70, pulse 61, temperature 98 F (36.7 C), temperature source Tympanic, resp. rate 14, height _0  (1.753 m), weight 233 lb 12.8 oz (106.1 kg), SpO2 99 %.  LABORATORY DATA: Lab Results  Component Value Date   WBC 6.4 07/03/2020   HGB 15.0 07/03/2020   HCT 45.1 07/03/2020   MCV 92.6 07/03/2020   PLT 142 (L) 07/03/2020      Chemistry      Component Value Date/Time   NA 140 07/03/2020 1252   NA 142 10/01/2016 1534   K 4.8 07/03/2020 1252   K 4.2 10/01/2016 1534   CL 108 07/03/2020 1252   CO2 25 07/03/2020 1252   CO2 28 10/01/2016 1534   BUN 21 07/03/2020 1252   BUN 15.9 10/01/2016 1534   CREATININE 1.46 (H) 07/03/2020 1252   CREATININE 1.10 02/08/2017 1101   CREATININE 1.5 (H) 10/01/2016 1534      Component Value Date/Time   CALCIUM 9.0 07/03/2020 1252   CALCIUM 9.6 10/01/2016 1534   ALKPHOS 52 07/03/2020 1252   ALKPHOS 65 10/01/2016 1534   AST 28 07/03/2020 1252   AST 20 10/01/2016 1534   ALT 28 07/03/2020 1252   ALT 24 10/01/2016 1534   BILITOT 0.5 07/03/2020 1252   BILITOT 0.46 10/01/2016 1534       RADIOGRAPHIC STUDIES: No results found.  ASSESSMENT AND PLAN: This is a very pleasant 70 years old white male with a stage Ia non-small cell lung cancer status post right upper lobectomy with lymph node dissection on  September 02, 2016. The patient he was on observation since that time. Unfortunately there are some concerning findings with nodular density in the posterior right lower lobe as well as increase and right pleural effusion suspicious for disease recurrence. He underwent ultrasound-guided right thoracentesis and the cytology of the pleural fluid was consistent with recurrent adenocarcinoma. He had molecular studies performed by [facility] 1 that showed positive EGFR mutation with deletion 70. The patient was started on treatment with Tagrisso 80 mg p.o. daily on 11/05/2017.  He status post 33 months of treatment. The patient continues to tolerate his treatment fairly well with  no significant adverse effects. I recommended for him to continue his current treatment with Tagrisso with the same dose.  His blood work today is unremarkable except for mild thrombocytopenia and renal insufficiency. I will see him back for follow-up visit in 2 months for evaluation and repeat CT scan of the chest, abdomen pelvis for restaging of his disease. The patient was advised to call immediately if he has any concerning symptoms in the interval. The patient voices understanding of current disease status and treatment options and is in agreement with the current care plan. All questions were answered. The patient knows to call the clinic with any problems, questions or concerns. We can certainly see the patient much sooner if necessary.  Disclaimer: This note was dictated with voice recognition software. Similar sounding words can inadvertently be transcribed and may not be corrected upon review.

## 2020-07-03 NOTE — Telephone Encounter (Signed)
Scheduled follow-up appointments per 2/23 los. Patient is aware.

## 2020-07-04 DIAGNOSIS — G4733 Obstructive sleep apnea (adult) (pediatric): Secondary | ICD-10-CM | POA: Diagnosis not present

## 2020-07-09 ENCOUNTER — Other Ambulatory Visit: Payer: Self-pay | Admitting: Internal Medicine

## 2020-07-09 DIAGNOSIS — C3491 Malignant neoplasm of unspecified part of right bronchus or lung: Secondary | ICD-10-CM

## 2020-07-16 MED FILL — TAGRISSO 80 MG TABLET: 80 | 30 days supply | Qty: 30 | Fill #0

## 2020-08-06 ENCOUNTER — Other Ambulatory Visit (HOSPITAL_COMMUNITY): Payer: Self-pay

## 2020-08-11 ENCOUNTER — Other Ambulatory Visit (HOSPITAL_COMMUNITY): Payer: Self-pay

## 2020-08-11 MED FILL — Osimertinib Mesylate Tab 80 MG (Base Equivalent): ORAL | 30 days supply | Qty: 30 | Fill #0 | Status: AC

## 2020-08-12 ENCOUNTER — Other Ambulatory Visit (HOSPITAL_COMMUNITY): Payer: Self-pay

## 2020-08-13 ENCOUNTER — Other Ambulatory Visit (HOSPITAL_COMMUNITY): Payer: Self-pay

## 2020-08-14 ENCOUNTER — Other Ambulatory Visit (HOSPITAL_COMMUNITY): Payer: Self-pay

## 2020-08-26 ENCOUNTER — Encounter (HOSPITAL_COMMUNITY): Payer: Self-pay

## 2020-08-26 ENCOUNTER — Ambulatory Visit (HOSPITAL_COMMUNITY)
Admission: RE | Admit: 2020-08-26 | Discharge: 2020-08-26 | Disposition: A | Discharge status: Home / Self Care | Payer: Medicare PPO | Source: Ambulatory Visit | Attending: Internal Medicine | Admitting: Internal Medicine

## 2020-08-26 ENCOUNTER — Other Ambulatory Visit: Payer: Self-pay

## 2020-08-26 ENCOUNTER — Inpatient Hospital Stay: Payer: Medicare PPO | Attending: Internal Medicine

## 2020-08-26 DIAGNOSIS — C3411 Malignant neoplasm of upper lobe, right bronchus or lung: Secondary | ICD-10-CM | POA: Insufficient documentation

## 2020-08-26 DIAGNOSIS — R21 Rash and other nonspecific skin eruption: Secondary | ICD-10-CM | POA: Insufficient documentation

## 2020-08-26 DIAGNOSIS — N400 Enlarged prostate without lower urinary tract symptoms: Secondary | ICD-10-CM | POA: Diagnosis not present

## 2020-08-26 DIAGNOSIS — Z902 Acquired absence of lung [part of]: Secondary | ICD-10-CM | POA: Insufficient documentation

## 2020-08-26 DIAGNOSIS — M47814 Spondylosis without myelopathy or radiculopathy, thoracic region: Secondary | ICD-10-CM | POA: Diagnosis not present

## 2020-08-26 DIAGNOSIS — Z79899 Other long term (current) drug therapy: Secondary | ICD-10-CM | POA: Diagnosis not present

## 2020-08-26 DIAGNOSIS — C3490 Malignant neoplasm of unspecified part of unspecified bronchus or lung: Secondary | ICD-10-CM | POA: Diagnosis not present

## 2020-08-26 DIAGNOSIS — N3289 Other specified disorders of bladder: Secondary | ICD-10-CM | POA: Diagnosis not present

## 2020-08-26 DIAGNOSIS — J90 Pleural effusion, not elsewhere classified: Secondary | ICD-10-CM | POA: Diagnosis not present

## 2020-08-26 DIAGNOSIS — N281 Cyst of kidney, acquired: Secondary | ICD-10-CM | POA: Diagnosis not present

## 2020-08-26 DIAGNOSIS — I2510 Atherosclerotic heart disease of native coronary artery without angina pectoris: Secondary | ICD-10-CM | POA: Diagnosis not present

## 2020-08-26 DIAGNOSIS — K7689 Other specified diseases of liver: Secondary | ICD-10-CM | POA: Diagnosis not present

## 2020-08-26 DIAGNOSIS — J984 Other disorders of lung: Secondary | ICD-10-CM | POA: Diagnosis not present

## 2020-08-26 LAB — CBC WITH DIFFERENTIAL (CANCER CENTER ONLY)
Abs Immature Granulocytes: 0.02 10*3/uL (ref 0.00–0.07)
Basophils Absolute: 0 10*3/uL (ref 0.0–0.1)
Basophils Relative: 1 %
Eosinophils Absolute: 0.2 10*3/uL (ref 0.0–0.5)
Eosinophils Relative: 3 %
HCT: 45.3 % (ref 39.0–52.0)
Hemoglobin: 15.1 g/dL (ref 13.0–17.0)
Immature Granulocytes: 0 %
Lymphocytes Relative: 28 %
Lymphs Abs: 2 10*3/uL (ref 0.7–4.0)
MCH: 31.5 pg (ref 26.0–34.0)
MCHC: 33.3 g/dL (ref 30.0–36.0)
MCV: 94.4 fL (ref 80.0–100.0)
Monocytes Absolute: 0.8 10*3/uL (ref 0.1–1.0)
Monocytes Relative: 11 %
Neutro Abs: 4.2 10*3/uL (ref 1.7–7.7)
Neutrophils Relative %: 57 %
Platelet Count: 136 10*3/uL — ABNORMAL LOW (ref 150–400)
RBC: 4.8 MIL/uL (ref 4.22–5.81)
RDW: 14.6 % (ref 11.5–15.5)
WBC Count: 7.2 10*3/uL (ref 4.0–10.5)
nRBC: 0 % (ref 0.0–0.2)

## 2020-08-26 LAB — CMP (CANCER CENTER ONLY)
ALT: 27 U/L (ref 0–44)
AST: 26 U/L (ref 15–41)
Albumin: 3.8 g/dL (ref 3.5–5.0)
Alkaline Phosphatase: 50 U/L (ref 38–126)
Anion gap: 10 (ref 5–15)
BUN: 16 mg/dL (ref 8–23)
CO2: 25 mmol/L (ref 22–32)
Calcium: 9.2 mg/dL (ref 8.9–10.3)
Chloride: 107 mmol/L (ref 98–111)
Creatinine: 1.44 mg/dL — ABNORMAL HIGH (ref 0.61–1.24)
GFR, Estimated: 52 mL/min — ABNORMAL LOW (ref >60)
Glucose, Bld: 116 mg/dL — ABNORMAL HIGH (ref 70–99)
Potassium: 4 mmol/L (ref 3.5–5.1)
Sodium: 142 mmol/L (ref 135–145)
Total Bilirubin: 0.7 mg/dL (ref 0.3–1.2)
Total Protein: 6.6 g/dL (ref 6.5–8.1)

## 2020-08-26 MED ORDER — IOHEXOL 300 MG/ML  SOLN
100.0000 mL | Freq: Once | INTRAMUSCULAR | Status: AC | PRN
  Administered 2020-08-26: 100 mL via INTRAVENOUS

## 2020-08-28 ENCOUNTER — Other Ambulatory Visit: Payer: Self-pay

## 2020-08-28 ENCOUNTER — Inpatient Hospital Stay: Payer: Medicare PPO | Admitting: Internal Medicine

## 2020-08-28 ENCOUNTER — Telehealth: Payer: Self-pay | Admitting: Internal Medicine

## 2020-08-28 VITALS — BP 138/65 | HR 67 | Temp 98.2°F | Resp 20 | Ht 69.0 in | Wt 232.8 lb

## 2020-08-28 DIAGNOSIS — Z79899 Other long term (current) drug therapy: Secondary | ICD-10-CM | POA: Diagnosis not present

## 2020-08-28 DIAGNOSIS — C3491 Malignant neoplasm of unspecified part of right bronchus or lung: Secondary | ICD-10-CM | POA: Diagnosis not present

## 2020-08-28 DIAGNOSIS — C3411 Malignant neoplasm of upper lobe, right bronchus or lung: Secondary | ICD-10-CM | POA: Diagnosis not present

## 2020-08-28 DIAGNOSIS — Z902 Acquired absence of lung [part of]: Secondary | ICD-10-CM | POA: Diagnosis not present

## 2020-08-28 DIAGNOSIS — R21 Rash and other nonspecific skin eruption: Secondary | ICD-10-CM | POA: Diagnosis not present

## 2020-08-28 DIAGNOSIS — J90 Pleural effusion, not elsewhere classified: Secondary | ICD-10-CM | POA: Diagnosis not present

## 2020-08-28 NOTE — Telephone Encounter (Signed)
Scheduled follow-up appointment per 4/20 los. Patient is aware.

## 2020-08-28 NOTE — Progress Notes (Signed)
[place] [phone]   [phone]  OFFICE PROGRESS NOTE  [name], MD [place] [address]  DIAGNOSIS: Recurrent non-small cell lung cancer, adenocarcinoma initially diagnosed as stage IA (T1c, N0, M0) non-small cell lung cancer, adenocarcinoma presented with right upper lobe lung nodule in April 2018 with recurrence in June 2019.  Biomarker Findings Microsatellite status - MS-Stable Tumor Mutational Burden - TMB-Low (3 Muts/Mb) Genomic Findings For a complete list of the genes assayed, please refer to the Appendix. EGFR exon 19 deletion ([id number]>M) CDKN2A/B loss NKX2-1 amplification 7 Disease relevant genes with no reportable alterations: KRAS, ALK, BRAF, MET, RET, ERBB2, ROS1   PRIOR THERAPY: Status post right upper lobectomy with lymph node dissection under the care of Dr. [name] on 09/02/2016.  CURRENT THERAPY: Tagrisso 80 mg p.o. daily.  First dose started November 05, 2017.  Status post 34 months of treatment.  INTERVAL HISTORY: James [name] 70 y.o. male returns to the clinic today for follow-up visit.  The patient is feeling fine today with no concerning complaints except for the facial skin rash.  He applied hydrocortisone cream with no improvement.  He also has intermittent diarrhea and constipation.  He has no weight loss or night sweats.  He has no chest pain, shortness of breath, cough or hemoptysis.  He has no headache or visual changes.  He continues to tolerate his treatment with Tagrisso fairly well.  The patient is here today for evaluation with repeat CT scan of the chest, abdomen pelvis for restaging of his disease.  MEDICAL HISTORY: Past Medical History:  Diagnosis Date  . Adenocarcinoma of right lung, stage 1 ([place]) 09/06/2016  . Anxiety   . Arthritis    "knees, hips, back" (10/19/2012)  . Chronic diastolic congestive heart failure ([place])   . Chronic lower back pain   . Colon polyps     10/27/2002, repeat letter 09/17/2007  . Coronary artery disease   . Coronary artery disease involving native coronary artery of native heart without angina pectoris   . Depressive disorder, not elsewhere classified    no meds  . Diabetes mellitus without complication ([place])    diet controlled- no med  (while in hosp 4/18 -elevated cbg  . Dyspnea   . Fasting hyperglycemia   . GERD (gastroesophageal reflux disease)   . Heart murmur   . Hemoptysis    abnormal CT Chest 01/29/10 - ? new GG changes RUL > not viz on plain cxr 02/26/2010  . Hypertension   . Mitral regurgitation    severe MR 08/2016  . MPN (myeloproliferative neoplasm) ([place])    1st detected 06/04/1998  . Obesity   . OSA on CPAP    last sleep study 10 years ago  . Other and unspecified hyperlipidemia   . Peripheral vascular disease ([place]) 08/2016   after lung surgey small clots in lungs,after hip dvt-5/16  . Pneumonia    4/18  . Positive PPD 1965   "non reactive in 2012" (10/19/2012)  . Routine general medical examination at a health care facility   . S/P CABG x 1 03/24/2017   LIMA to LAD  . S/P mitral valve repair 03/24/2017   Complex valvuloplasty including artificial Gore-tex neochord placement x6 and 28 mm Sorin Annuloflex posterior annuloplasty band  . Special screening for malignant neoplasm of prostate   . Spinal stenosis, unspecified region other than cervical   . Wrist pain, left  ALLERGIES:  is allergic to symbicort [budesonide-formoterol fumarate] and amoxicillin.  MEDICATIONS:  Current Outpatient Medications  Medication Sig Dispense Refill  . ezetimibe (ZETIA) 10 MG tablet Take 1 tablet (10 mg total) by mouth daily. 90 tablet 3  . furosemide (LASIX) 20 MG tablet Take 0.5 tablets (10 mg total) by mouth daily. Take 1/2 tablet 10 mg daily (Patient taking differently: Take 10 mg by mouth daily.) 45 tablet 3  . metoprolol tartrate (LOPRESSOR) 25 MG tablet Take 0.5 tablets (12.5 mg total) by mouth 2 (two) times  daily. 90 tablet 3  . Multiple Vitamin (MULTIVITAMIN) tablet Take 1 tablet by mouth daily.    . Omega-3 Fatty Acids (FISH OIL) 500 MG CAPS Take 500 mg by mouth daily.     Marland Kitchen osimertinib mesylate (TAGRISSO) 80 MG tablet TAKE 1 TABLET (80 MG TOTAL) BY MOUTH DAILY. 30 tablet 2  . rivaroxaban (XARELTO) 20 MG TABS tablet Take 1 tablet (20 mg total) by mouth daily with supper. 90 tablet 3  . sildenafil (REVATIO) 20 MG tablet Take 4 tablets (80 mg total) by mouth daily as needed. 60 tablet 5   No current facility-administered medications for this visit.    SURGICAL HISTORY:  Past Surgical History:  Procedure Laterality Date  . ANTERIOR CRUCIATE LIGAMENT REPAIR Left 1967  . CARDIAC CATHETERIZATION  2000  . CHEST TUBE INSERTION Right 10/19/2012   post bronch  . COLONOSCOPY W/ POLYPECTOMY    . CORONARY ARTERY BYPASS GRAFT N/A 03/24/2017   Procedure: CORONARY ARTERY BYPASS GRAFTING (CABG)x1 using left internal mammary artery, LIMA-LAD;  Surgeon: Rexene Alberts, MD;  Location: [facility];  Service: Open Heart Surgery;  Laterality: N/A;  . FLEXIBLE BRONCHOSCOPY  10/19/2012   Flexible video fiberoptic bronchoscopy with electromagnetic navigation and biopsies.  . INTRAVASCULAR PRESSURE WIRE/FFR STUDY N/A 08/11/2016   Procedure: Intravascular Pressure Wire/FFR Study;  Surgeon: [name], MD;  Location: [facility];  Service: Cardiovascular;  Laterality: N/A;  . IR ANGIOGRAM PULMONARY BILATERAL SELECTIVE  02/06/2018  . IR ANGIOGRAM SELECTIVE EACH ADDITIONAL VESSEL  02/06/2018  . IR ANGIOGRAM SELECTIVE EACH ADDITIONAL VESSEL  02/06/2018  . IR INFUSION THROMBOL ARTERIAL INITIAL (MS)  02/06/2018  . IR INFUSION THROMBOL ARTERIAL INITIAL (MS)  02/06/2018  . IR THROMB F/U EVAL ART/VEN FINAL DAY (MS)  02/07/2018  . IR US GUIDE VASC ACCESS RIGHT  02/06/2018  . LOBECTOMY Right 09/02/2016   Procedure: RIGHT UPPER LOBECTOMY;  Surgeon: Melrose Nakayama, MD;  Location: [facility];  Service: Thoracic;  Laterality:  Right;  . LYMPH NODE DISSECTION Right 09/02/2016   Procedure: LYMPH NODE DISSECTION, RIGHT LUNG;  Surgeon: Melrose Nakayama, MD;  Location: [facility];  Service: Thoracic;  Laterality: Right;  . MITRAL VALVE REPAIR N/A 03/24/2017   Procedure: MITRAL VALVE REPAIR (MVR) with Sorin Carbomedics Annuloflex size 28;  Surgeon: Rexene Alberts, MD;  Location: [facility];  Service: Open Heart Surgery;  Laterality: N/A;  . RIGHT/LEFT HEART CATH AND CORONARY ANGIOGRAPHY N/A 08/11/2016   Procedure: Right/Left Heart Cath and Coronary Angiography;  Surgeon: [name], MD;  Location: [facility];  Service: Cardiovascular;  Laterality: N/A;  . TEE WITHOUT CARDIOVERSION N/A 07/17/2016   Procedure: TRANSESOPHAGEAL ECHOCARDIOGRAM (TEE);  Surgeon: [name], MD;  Location: [facility];  Service: Cardiovascular;  Laterality: N/A;  . TEE WITHOUT CARDIOVERSION N/A 03/24/2017   Procedure: TRANSESOPHAGEAL ECHOCARDIOGRAM (TEE);  Surgeon: Rexene Alberts, MD;  Location: [facility];  Service: Open Heart Surgery;  Laterality: N/A;  . TONSILLECTOMY  1950's  . TOTAL HIP ARTHROPLASTY Left 10/05/2014   dr [name]  . TOTAL HIP ARTHROPLASTY Left 10/05/2014   Procedure: LEFT TOTAL HIP ARTHROPLASTY ANTERIOR APPROACH;  Surgeon: [name], MD;  Location: [facility];  Service: Orthopedics;  Laterality: Left;  Marland Kitchen VIDEO ASSISTED THORACOSCOPY (VATS)/WEDGE RESECTION Right 09/02/2016   Procedure: VIDEO ASSISTED THORACOSCOPY (VATS)/RIGHT UPPER LOBE WEDGE RESECTION;  Surgeon: Melrose Nakayama, MD;  Location: [facility];  Service: Thoracic;  Laterality: Right;  Marland Kitchen VIDEO BRONCHOSCOPY WITH ENDOBRONCHIAL NAVIGATION N/A 10/19/2012   Procedure: VIDEO BRONCHOSCOPY WITH ENDOBRONCHIAL NAVIGATION;  Surgeon: [name], MD;  Location: [facility];  Service: Thoracic;  Laterality: N/A;  . WRIST RECONSTRUCTION Left 12/2009   'proximal row carpectomy" [name]    REVIEW OF SYSTEMS:  Constitutional: negative Eyes: negative Ears, nose, mouth, throat, and face:  negative Respiratory: negative Cardiovascular: negative Gastrointestinal: negative Genitourinary:negative Integument/breast: positive for rash Hematologic/lymphatic: negative Musculoskeletal:negative Neurological: negative Behavioral/Psych: negative Endocrine: negative Allergic/Immunologic: negative   PHYSICAL EXAMINATION: General appearance: alert, cooperative and no distress Head: Normocephalic, without obvious abnormality, atraumatic Neck: no adenopathy, no JVD, supple, symmetrical, trachea midline and thyroid not enlarged, symmetric, no tenderness/mass/nodules Lymph nodes: Cervical, supraclavicular, and axillary nodes normal. Resp: clear to auscultation bilaterally Back: symmetric, no curvature. ROM normal. No CVA tenderness. Cardio: regular rate and rhythm, S1, S2 normal, no murmur, click, rub or gallop GI: soft, non-tender; bowel sounds normal; no masses,  no organomegaly Extremities: extremities normal, atraumatic, no cyanosis or edema Neurologic: Alert and oriented X 3, normal strength and tone. Normal symmetric reflexes. Normal coordination and gait  ECOG PERFORMANCE STATUS: 1 - Symptomatic but completely ambulatory  Blood pressure 138/65, pulse 67, temperature 98.2 F (36.8 C), temperature source Tympanic, resp. rate 20, height $RemoveBe'5\' 9"'VoCBkkxcG$  (1.753 m), weight 232 lb 12.8 oz (105.6 kg), SpO2 97 %.  LABORATORY DATA: Lab Results  Component Value Date   WBC 7.2 08/26/2020   HGB 15.1 08/26/2020   HCT 45.3 08/26/2020   MCV 94.4 08/26/2020   PLT 136 (L) 08/26/2020      Chemistry      Component Value Date/Time   NA 142 08/26/2020 0904   NA 142 10/01/2016 1534   K 4.0 08/26/2020 0904   K 4.2 10/01/2016 1534   CL 107 08/26/2020 0904   CO2 25 08/26/2020 0904   CO2 28 10/01/2016 1534   BUN 16 08/26/2020 0904   BUN 15.9 10/01/2016 1534   CREATININE 1.44 (H) 08/26/2020 0904   CREATININE 1.10 02/08/2017 1101   CREATININE 1.5 (H) 10/01/2016 1534      Component Value Date/Time    CALCIUM 9.2 08/26/2020 0904   CALCIUM 9.6 10/01/2016 1534   ALKPHOS 50 08/26/2020 0904   ALKPHOS 65 10/01/2016 1534   AST 26 08/26/2020 0904   AST 20 10/01/2016 1534   ALT 27 08/26/2020 0904   ALT 24 10/01/2016 1534   BILITOT 0.7 08/26/2020 0904   BILITOT 0.46 10/01/2016 1534       RADIOGRAPHIC STUDIES: CT Chest W Contrast  Result Date: 08/26/2020 CLINICAL DATA:  Restaging non-small cell lung cancer diagnosed in April 2018. Previous right upper lobectomy and chemotherapy. EXAM: CT CHEST, ABDOMEN, AND PELVIS WITH CONTRAST TECHNIQUE: Multidetector CT imaging of the chest, abdomen and pelvis was performed following the standard protocol during bolus administration of intravenous contrast. CONTRAST:  124mL OMNIPAQUE IOHEXOL 300 MG/ML  SOLN COMPARISON:  PET-CT 04/30/2020. CTs of the chest, abdomen and pelvis 04/12/2020. FINDINGS: CT CHEST FINDINGS Cardiovascular:  Atherosclerosis of the aorta, great vessels and coronary arteries status post median sternotomy and CABG. There are calcifications of the aortic valve and a mitral valve prosthesis. The heart size is normal. There is no pericardial effusion. Mediastinum/Nodes: There are no enlarged mediastinal, hilar or axillary lymph nodes. There are postsurgical changes in the right axilla. The thyroid gland, trachea and esophagus appear unremarkable. Lungs/Pleura: Stable trace pleural fluid or thickening posteriorly on the right. No pneumothorax. Status post right upper lobe resection. The perifissural nodule medially along the minor fissure is unchanged, measuring 1.3 x 0.7 cm on image 90/7. 6 mm nodule peripherally in the right lower lobe on image 67/7 is stable. The small left upper lobe nodules on images 42 and 44 of series 7 are stable. No new or enlarging nodules. Stable subpleural scarring peripherally at the left lung base. Musculoskeletal/Chest wall: No chest wall mass or suspicious osseous findings. Mild thoracic spondylosis. Previous median  sternotomy. CT ABDOMEN AND PELVIS FINDINGS Hepatobiliary: The liver is normal in density without suspicious focal abnormality. Hepatic cysts are unchanged. No evidence of gallstones, gallbladder wall thickening or biliary dilatation. Pancreas: Unremarkable. No pancreatic ductal dilatation or surrounding inflammatory changes. Spleen: Normal in size without focal abnormality. Stable small splenules. Adrenals/Urinary Tract: Both adrenal glands appear normal. Stable left renal sinus and lower pole cortical cysts. No evidence of renal mass, urinary tract calculus or hydronephrosis. Stable mild bladder wall thickening without focal abnormality. Stomach/Bowel: Enteric contrast was administered and has passed into the mid colon. The stomach appears unremarkable for its degree of distension. No evidence of bowel wall thickening, distention or surrounding inflammatory change. The appendix appears normal. Vascular/Lymphatic: There are no enlarged abdominal or pelvic lymph nodes. Aortic and branch vessel atherosclerosis without acute vascular findings. Reproductive: Stable mildly heterogeneous enlargement of the prostate gland. Other: Intact abdominal wall.  No ascites or peritoneal nodularity. Musculoskeletal: No acute or significant osseous findings. Previous left total hip arthroplasty. Mild lumbar spondylosis. IMPRESSION: 1. The perifissural nodule along the medial aspect of the minor fissure has not significantly changed in size compared with the prior CT of 5 months ago. This nodule was not hypermetabolic on PET-CT and may be reactive. Attention on follow-up recommended. 2. Stable small pulmonary nodules bilaterally consistent with benign findings. 3. No specific evidence of local recurrence or metastatic disease. 4. Stable incidental findings in the abdomen and pelvis, including hepatic and renal cysts, prostatomegaly and Aortic Atherosclerosis (ICD10-I70.0). Electronically Signed   By: Richardean Sale M.D.   On:  08/26/2020 14:25   CT Abdomen Pelvis W Contrast  Result Date: 08/26/2020 CLINICAL DATA:  Restaging non-small cell lung cancer diagnosed in April 2018. Previous right upper lobectomy and chemotherapy. EXAM: CT CHEST, ABDOMEN, AND PELVIS WITH CONTRAST TECHNIQUE: Multidetector CT imaging of the chest, abdomen and pelvis was performed following the standard protocol during bolus administration of intravenous contrast. CONTRAST:  166mL OMNIPAQUE IOHEXOL 300 MG/ML  SOLN COMPARISON:  PET-CT 04/30/2020. CTs of the chest, abdomen and pelvis 04/12/2020. FINDINGS: CT CHEST FINDINGS Cardiovascular: Atherosclerosis of the aorta, great vessels and coronary arteries status post median sternotomy and CABG. There are calcifications of the aortic valve and a mitral valve prosthesis. The heart size is normal. There is no pericardial effusion. Mediastinum/Nodes: There are no enlarged mediastinal, hilar or axillary lymph nodes. There are postsurgical changes in the right axilla. The thyroid gland, trachea and esophagus appear unremarkable. Lungs/Pleura: Stable trace pleural fluid or thickening posteriorly on the right. No pneumothorax. Status post right upper lobe resection.  The perifissural nodule medially along the minor fissure is unchanged, measuring 1.3 x 0.7 cm on image 90/7. 6 mm nodule peripherally in the right lower lobe on image 67/7 is stable. The small left upper lobe nodules on images 42 and 44 of series 7 are stable. No new or enlarging nodules. Stable subpleural scarring peripherally at the left lung base. Musculoskeletal/Chest wall: No chest wall mass or suspicious osseous findings. Mild thoracic spondylosis. Previous median sternotomy. CT ABDOMEN AND PELVIS FINDINGS Hepatobiliary: The liver is normal in density without suspicious focal abnormality. Hepatic cysts are unchanged. No evidence of gallstones, gallbladder wall thickening or biliary dilatation. Pancreas: Unremarkable. No pancreatic ductal dilatation or  surrounding inflammatory changes. Spleen: Normal in size without focal abnormality. Stable small splenules. Adrenals/Urinary Tract: Both adrenal glands appear normal. Stable left renal sinus and lower pole cortical cysts. No evidence of renal mass, urinary tract calculus or hydronephrosis. Stable mild bladder wall thickening without focal abnormality. Stomach/Bowel: Enteric contrast was administered and has passed into the mid colon. The stomach appears unremarkable for its degree of distension. No evidence of bowel wall thickening, distention or surrounding inflammatory change. The appendix appears normal. Vascular/Lymphatic: There are no enlarged abdominal or pelvic lymph nodes. Aortic and branch vessel atherosclerosis without acute vascular findings. Reproductive: Stable mildly heterogeneous enlargement of the prostate gland. Other: Intact abdominal wall.  No ascites or peritoneal nodularity. Musculoskeletal: No acute or significant osseous findings. Previous left total hip arthroplasty. Mild lumbar spondylosis. IMPRESSION: 1. The perifissural nodule along the medial aspect of the minor fissure has not significantly changed in size compared with the prior CT of 5 months ago. This nodule was not hypermetabolic on PET-CT and may be reactive. Attention on follow-up recommended. 2. Stable small pulmonary nodules bilaterally consistent with benign findings. 3. No specific evidence of local recurrence or metastatic disease. 4. Stable incidental findings in the abdomen and pelvis, including hepatic and renal cysts, prostatomegaly and Aortic Atherosclerosis (ICD10-I70.0). Electronically Signed   By: Richardean Sale M.D.   On: 08/26/2020 14:25    ASSESSMENT AND PLAN: This is a very pleasant 70 years old white male with a stage Ia non-small cell lung cancer status post right upper lobectomy with lymph node dissection on September 02, 2016. The patient he was on observation since that time. Unfortunately there are some  concerning findings with nodular density in the posterior right lower lobe as well as increase and right pleural effusion suspicious for disease recurrence. He underwent ultrasound-guided right thoracentesis and the cytology of the pleural fluid was consistent with recurrent adenocarcinoma. He had molecular studies performed by foundation 1 that showed positive EGFR mutation with deletion 75. The patient was started on treatment with Tagrisso 80 mg p.o. daily on 11/05/2017.  He status post 34 months of treatment. The patient continues to tolerate this treatment well with no concerning adverse effects except for the skin rash on the face. He had repeat CT scan of the chest, abdomen pelvis performed recently.  I personally and independently reviewed the scans and discussed the results with the patient and his wife today. Has a scan showed no concerning findings for disease progression. I recommended for him to continue his current treatment with Tagrisso with the same dose. For the skin rash, I will start the patient on clindamycin lotion on as-needed basis. He will come back for follow-up visit in 2 months for evaluation and repeat blood work. He was advised to call immediately if he has any concerning symptoms in the interval.  The patient voices understanding of current disease status and treatment options and is in agreement with the current care plan. All questions were answered. The patient knows to call the clinic with any problems, questions or concerns. We can certainly see the patient much sooner if necessary.  Disclaimer: This note was dictated with voice recognition software. Similar sounding words can inadvertently be transcribed and may not be corrected upon review.

## 2020-09-11 ENCOUNTER — Other Ambulatory Visit (HOSPITAL_COMMUNITY): Payer: Self-pay

## 2020-09-11 MED FILL — Osimertinib Mesylate Tab 80 MG (Base Equivalent): ORAL | 30 days supply | Qty: 30 | Fill #1 | Status: AC

## 2020-09-17 ENCOUNTER — Other Ambulatory Visit (HOSPITAL_COMMUNITY): Payer: Self-pay

## 2020-10-09 ENCOUNTER — Telehealth: Payer: Self-pay | Admitting: Internal Medicine

## 2020-10-09 NOTE — Telephone Encounter (Signed)
I had a verbal conversation with PCP. He would like to see the pt if no other provider had an opening. Office is full.  Will attempt to get pt scheduled today.

## 2020-10-09 NOTE — Telephone Encounter (Signed)
[facility] FYI   Has knots on the back of legs, discolored, feel some pain. Is on blood thinners. Noticed 3 days ago. Not sever pain. Noticed after a walk. Hx of PE. Always sob but not any increase than normal. Left calf area.  Advised to SEE HCP (OR PCP TRIAGE) WITHIN 4 HOURS: * UCC: Some UCCs can manage patients who are stable and have less serious symptoms (e.g., minor illnesses and injuries). The triager must know the [facility] capabilities before sending a patient there. If unsure, call ahead. CALL EMS IF: * You develop any chest pain or shortness of breath. CALL BACK IF: * You become worse CARE ADVICE given per Leg Pain (Adult) guideline.  Please advise. No available opening for today.

## 2020-10-09 NOTE — Telephone Encounter (Signed)
   Patient calling to report "knots on back of leg" Patient is concerned about blood clots. He is currently taking Xarelto. Patient states his leg feels "very odd"  Call transferred to [facility] for triage

## 2020-10-10 DIAGNOSIS — I83892 Varicose veins of left lower extremities with other complications: Secondary | ICD-10-CM | POA: Diagnosis not present

## 2020-10-10 DIAGNOSIS — I8312 Varicose veins of left lower extremity with inflammation: Secondary | ICD-10-CM | POA: Diagnosis not present

## 2020-10-11 ENCOUNTER — Other Ambulatory Visit (HOSPITAL_COMMUNITY): Payer: Self-pay

## 2020-10-11 ENCOUNTER — Other Ambulatory Visit: Payer: Self-pay | Admitting: Internal Medicine

## 2020-10-11 DIAGNOSIS — C3491 Malignant neoplasm of unspecified part of right bronchus or lung: Secondary | ICD-10-CM

## 2020-10-11 MED ORDER — OSIMERTINIB MESYLATE 80 MG PO TABS
ORAL_TABLET | Freq: Every day | ORAL | 2 refills | Status: DC
  Filled 2020-10-11: qty 30, 30d supply, fill #0
  Filled 2020-11-07: qty 30, 30d supply, fill #1
  Filled 2020-12-05: qty 30, 30d supply, fill #2

## 2020-10-16 ENCOUNTER — Other Ambulatory Visit (HOSPITAL_COMMUNITY): Payer: Self-pay

## 2020-10-28 ENCOUNTER — Other Ambulatory Visit: Payer: Self-pay

## 2020-10-28 ENCOUNTER — Other Ambulatory Visit: Payer: Self-pay | Admitting: Cardiology

## 2020-10-28 ENCOUNTER — Inpatient Hospital Stay: Payer: Medicare PPO

## 2020-10-28 ENCOUNTER — Inpatient Hospital Stay: Payer: Medicare PPO | Attending: Internal Medicine | Admitting: Internal Medicine

## 2020-10-28 VITALS — BP 134/67 | HR 67 | Temp 97.6°F | Resp 20 | Ht 69.0 in | Wt 229.3 lb

## 2020-10-28 DIAGNOSIS — Z902 Acquired absence of lung [part of]: Secondary | ICD-10-CM | POA: Insufficient documentation

## 2020-10-28 DIAGNOSIS — C3491 Malignant neoplasm of unspecified part of right bronchus or lung: Secondary | ICD-10-CM | POA: Diagnosis not present

## 2020-10-28 DIAGNOSIS — C3411 Malignant neoplasm of upper lobe, right bronchus or lung: Secondary | ICD-10-CM | POA: Diagnosis not present

## 2020-10-28 DIAGNOSIS — C3490 Malignant neoplasm of unspecified part of unspecified bronchus or lung: Secondary | ICD-10-CM

## 2020-10-28 DIAGNOSIS — Z79899 Other long term (current) drug therapy: Secondary | ICD-10-CM | POA: Insufficient documentation

## 2020-10-28 DIAGNOSIS — Z5111 Encounter for antineoplastic chemotherapy: Secondary | ICD-10-CM

## 2020-10-28 LAB — CMP (CANCER CENTER ONLY)
ALT: 33 U/L (ref 0–44)
AST: 29 U/L (ref 15–41)
Albumin: 4 g/dL (ref 3.5–5.0)
Alkaline Phosphatase: 62 U/L (ref 38–126)
Anion gap: 9 (ref 5–15)
BUN: 25 mg/dL — ABNORMAL HIGH (ref 8–23)
CO2: 22 mmol/L (ref 22–32)
Calcium: 9.3 mg/dL (ref 8.9–10.3)
Chloride: 109 mmol/L (ref 98–111)
Creatinine: 1.3 mg/dL — ABNORMAL HIGH (ref 0.61–1.24)
GFR, Estimated: 59 mL/min — ABNORMAL LOW (ref >60)
Glucose, Bld: 149 mg/dL — ABNORMAL HIGH (ref 70–99)
Potassium: 4 mmol/L (ref 3.5–5.1)
Sodium: 140 mmol/L (ref 135–145)
Total Bilirubin: 0.7 mg/dL (ref 0.3–1.2)
Total Protein: 6.9 g/dL (ref 6.5–8.1)

## 2020-10-28 LAB — CBC WITH DIFFERENTIAL (CANCER CENTER ONLY)
Abs Immature Granulocytes: 0.03 10*3/uL (ref 0.00–0.07)
Basophils Absolute: 0 10*3/uL (ref 0.0–0.1)
Basophils Relative: 1 %
Eosinophils Absolute: 0.1 10*3/uL (ref 0.0–0.5)
Eosinophils Relative: 2 %
HCT: 44.2 % (ref 39.0–52.0)
Hemoglobin: 15 g/dL (ref 13.0–17.0)
Immature Granulocytes: 1 %
Lymphocytes Relative: 29 %
Lymphs Abs: 1.9 10*3/uL (ref 0.7–4.0)
MCH: 31.6 pg (ref 26.0–34.0)
MCHC: 33.9 g/dL (ref 30.0–36.0)
MCV: 93.1 fL (ref 80.0–100.0)
Monocytes Absolute: 0.6 10*3/uL (ref 0.1–1.0)
Monocytes Relative: 10 %
Neutro Abs: 3.8 10*3/uL (ref 1.7–7.7)
Neutrophils Relative %: 57 %
Platelet Count: 136 10*3/uL — ABNORMAL LOW (ref 150–400)
RBC: 4.75 MIL/uL (ref 4.22–5.81)
RDW: 14.4 % (ref 11.5–15.5)
WBC Count: 6.4 10*3/uL (ref 4.0–10.5)
nRBC: 0 % (ref 0.0–0.2)

## 2020-10-28 NOTE — Progress Notes (Signed)
[facility] Telephone:([phone]) [phone]   Fax:([phone]  OFFICE PROGRESS NOTE  [name], MD [place] [address]  DIAGNOSIS: Recurrent non-small cell lung cancer, adenocarcinoma initially diagnosed as stage IA (T1c, N0, M0) non-small cell lung cancer, adenocarcinoma presented with right upper lobe lung nodule in April 2018 with recurrence in June 2019.  Biomarker Findings Microsatellite status - MS-Stable Tumor Mutational Burden - TMB-Low (3 Muts/Mb) Genomic Findings For a complete list of the genes assayed, please refer to the Appendix. EGFR exon 19 deletion ([id number]) CDKN2A/B loss NKX2-1 amplification 7 Disease relevant genes with no reportable alterations: KRAS, ALK, BRAF, MET, RET, ERBB2, ROS1   PRIOR THERAPY: Status post right upper lobectomy with lymph node dissection under the care of Dr. [name] on 09/02/2016.  CURRENT THERAPY: Tagrisso 80 mg p.o. daily.  First dose started November 05, 2017.  Status post 36 months of treatment.  INTERVAL HISTORY: James [name] 70 y.o. male returns to the clinic today for follow-up visit.  The patient is feeling fine today with no concerning complaints.  He intentionally lost few pounds since the last visit.  He denied having any current chest pain, shortness of breath, cough or hemoptysis.  He denied having any nausea, vomiting, diarrhea or constipation.  He has no headache or visual changes.  He continues to tolerate his treatment with Tagrisso fairly well.  The patient is here today for evaluation and repeat blood work.  MEDICAL HISTORY: Past Medical History:  Diagnosis Date   Adenocarcinoma of right lung, stage 1 ([facility]) 09/06/2016   Anxiety    Arthritis    "knees, hips, back" (10/19/2012)   Chronic diastolic congestive heart failure (HCC)    Chronic lower back pain    Colon polyps    10/27/2002, repeat letter 09/17/2007   Coronary artery disease    Coronary artery disease involving  native coronary artery of native heart without angina pectoris    Depressive disorder, not elsewhere classified    no meds   Diabetes mellitus without complication ([facility])    diet controlled- no med  (while in hosp 4/18 -elevated cbg   Dyspnea    Fasting hyperglycemia    GERD (gastroesophageal reflux disease)    Heart murmur    Hemoptysis    abnormal CT Chest 01/29/10 - ? new GG changes RUL > not viz on plain cxr 02/26/2010   Hypertension    Mitral regurgitation    severe MR 08/2016   MPN (myeloproliferative neoplasm) ([facility])    1st detected 06/04/1998   Obesity    OSA on CPAP    last sleep study 10 years ago   Other and unspecified hyperlipidemia    Peripheral vascular disease ([facility]) 08/2016   after lung surgey small clots in lungs,after hip dvt-5/16   Pneumonia    4/18   Positive PPD 1965   "non reactive in 2012" (10/19/2012)   Routine general medical examination at a health care facility    S/P CABG x 1 03/24/2017   LIMA to LAD   S/P mitral valve repair 03/24/2017   Complex valvuloplasty including artificial Gore-tex neochord placement x6 and 28 mm Sorin Annuloflex posterior annuloplasty band   Special screening for malignant neoplasm of prostate    Spinal stenosis, unspecified region other than cervical    Wrist pain, left     ALLERGIES:  is allergic to symbicort [budesonide-formoterol fumarate] and amoxicillin.  MEDICATIONS:  Current Outpatient Medications  Medication Sig  Dispense Refill   ezetimibe (ZETIA) 10 MG tablet TAKE 1 TABLET BY MOUTH EVERY DAY 90 tablet 3   furosemide (LASIX) 20 MG tablet Take 0.5 tablets (10 mg total) by mouth daily. Take 1/2 tablet 10 mg daily (Patient taking differently: Take 10 mg by mouth daily.) 45 tablet 3   metoprolol tartrate (LOPRESSOR) 25 MG tablet TAKE 0.5 TABLETS (12.5 MG TOTAL) BY MOUTH 2 (TWO) TIMES DAILY. 90 tablet 3   Multiple Vitamin (MULTIVITAMIN) tablet Take 1 tablet by mouth daily.     Omega-3 Fatty Acids (FISH OIL) 500 MG CAPS  Take 500 mg by mouth daily.      osimertinib mesylate (TAGRISSO) 80 MG tablet TAKE 1 TABLET (80 MG TOTAL) BY MOUTH DAILY. 30 tablet 2   sildenafil (REVATIO) 20 MG tablet Take 4 tablets (80 mg total) by mouth daily as needed. 60 tablet 5   XARELTO 20 MG TABS tablet TAKE 1 TABLET (20 MG TOTAL) BY MOUTH DAILY WITH SUPPER. 90 tablet 3   No current facility-administered medications for this visit.    SURGICAL HISTORY:  Past Surgical History:  Procedure Laterality Date   ANTERIOR CRUCIATE LIGAMENT REPAIR Left 1967   CARDIAC CATHETERIZATION  2000   CHEST TUBE INSERTION Right 10/19/2012   post bronch   COLONOSCOPY W/ POLYPECTOMY     CORONARY ARTERY BYPASS GRAFT N/A 03/24/2017   Procedure: CORONARY ARTERY BYPASS GRAFTING (CABG)x1 using left internal mammary artery, LIMA-LAD;  Surgeon: Rexene Alberts, MD;  Location: [facility];  Service: Open Heart Surgery;  Laterality: N/A;   FLEXIBLE BRONCHOSCOPY  10/19/2012   Flexible video fiberoptic bronchoscopy with electromagnetic navigation and biopsies.   INTRAVASCULAR PRESSURE WIRE/FFR STUDY N/A 08/11/2016   Procedure: Intravascular Pressure Wire/FFR Study;  Surgeon: [name], MD;  Location: [facility];  Service: Cardiovascular;  Laterality: N/A;   IR ANGIOGRAM PULMONARY BILATERAL SELECTIVE  02/06/2018   IR ANGIOGRAM SELECTIVE EACH ADDITIONAL VESSEL  02/06/2018   IR ANGIOGRAM SELECTIVE EACH ADDITIONAL VESSEL  02/06/2018   IR INFUSION THROMBOL ARTERIAL INITIAL (MS)  02/06/2018   IR INFUSION THROMBOL ARTERIAL INITIAL (MS)  02/06/2018   IR THROMB F/U EVAL ART/VEN FINAL DAY (MS)  02/07/2018   IR US GUIDE VASC ACCESS RIGHT  02/06/2018   LOBECTOMY Right 09/02/2016   Procedure: RIGHT UPPER LOBECTOMY;  Surgeon: Melrose Nakayama, MD;  Location: [facility];  Service: Thoracic;  Laterality: Right;   LYMPH NODE DISSECTION Right 09/02/2016   Procedure: LYMPH NODE DISSECTION, RIGHT LUNG;  Surgeon: Melrose Nakayama, MD;  Location: [facility];  Service: Thoracic;   Laterality: Right;   MITRAL VALVE REPAIR N/A 03/24/2017   Procedure: MITRAL VALVE REPAIR (MVR) with Sorin [employer]omedics Annuloflex size 28;  Surgeon: Rexene Alberts, MD;  Location: [facility];  Service: Open Heart Surgery;  Laterality: N/A;   RIGHT/LEFT HEART CATH AND CORONARY ANGIOGRAPHY N/A 08/11/2016   Procedure: Right/Left Heart Cath and Coronary Angiography;  Surgeon: [name], MD;  Location: [facility];  Service: Cardiovascular;  Laterality: N/A;   TEE WITHOUT CARDIOVERSION N/A 07/17/2016   Procedure: TRANSESOPHAGEAL ECHOCARDIOGRAM (TEE);  Surgeon: [name], MD;  Location[facility];  Service: Cardiovascular;  Laterality: N/A;   TEE WITHOUT CARDIOVERSION N/A 03/24/2017   Procedure: TRANSESOPHAGEAL ECHOCARDIOGRAM (TEE);  Surgeon: Rexene Alberts, MD;  Location: [facility];  Service: Open Heart Surgery;  Laterality: N/A;   TONSILLECTOMY  1950's   TOTAL HIP ARTHROPLASTY Left 10/05/2014   dr [name]   TOTAL  HIP ARTHROPLASTY Left 10/05/2014   Procedure: LEFT TOTAL HIP ARTHROPLASTY ANTERIOR APPROACH;  Surgeon: [name], MD;  Location: [facility];  Service: Orthopedics;  Laterality: Left;   VIDEO ASSISTED THORACOSCOPY (VATS)/WEDGE RESECTION Right 09/02/2016   Procedure: VIDEO ASSISTED THORACOSCOPY (VATS)/RIGHT UPPER LOBE WEDGE RESECTION;  Surgeon: Melrose Nakayama, MD;  Location: [facility];  Service: Thoracic;  Laterality: Right;   VIDEO BRONCHOSCOPY WITH ENDOBRONCHIAL NAVIGATION N/A 10/19/2012   Procedure: VIDEO BRONCHOSCOPY WITH ENDOBRONCHIAL NAVIGATION;  Surgeon: [name], MD;  Location: [facility];  Service: Thoracic;  Laterality: N/A;   WRIST RECONSTRUCTION Left 12/2009   'proximal row carpectomy" [name]    REVIEW OF SYSTEMS:  A comprehensive review of systems was negative.   PHYSICAL EXAMINATION: General appearance: alert, cooperative, and no distress Head: Normocephalic, without obvious abnormality, atraumatic Neck: no adenopathy, no JVD, supple, symmetrical, trachea midline,  and thyroid not enlarged, symmetric, no tenderness/mass/nodules Lymph nodes: Cervical, supraclavicular, and axillary nodes normal. Resp: clear to auscultation bilaterally Back: symmetric, no curvature. ROM normal. No CVA tenderness. Cardio: regular rate and rhythm, S1, S2 normal, no murmur, click, rub or gallop GI: soft, non-tender; bowel sounds normal; no masses,  no organomegaly Extremities: extremities normal, atraumatic, no cyanosis or edema  ECOG PERFORMANCE STATUS: 1 - Symptomatic but completely ambulatory  Blood pressure 134/67, pulse 67, temperature 97.6 F (36.4 C), temperature source Tympanic, resp. rate 20, height 5' 9"  (1.753 m), weight 229 lb 4.8 oz (104 kg), SpO2 98 %.  LABORATORY DATA: Lab Results  Component Value Date   WBC 6.4 10/28/2020   HGB 15.0 10/28/2020   HCT 44.2 10/28/2020   MCV 93.1 10/28/2020   PLT 136 (L) 10/28/2020      Chemistry      Component Value Date/Time   NA 142 08/26/2020 0904   NA 142 10/01/2016 1534   K 4.0 08/26/2020 0904   K 4.2 10/01/2016 1534   CL 107 08/26/2020 0904   CO2 25 08/26/2020 0904   CO2 28 10/01/2016 1534   BUN 16 08/26/2020 0904   BUN 15.9 10/01/2016 1534   CREATININE 1.44 (H) 08/26/2020 0904   CREATININE 1.10 02/08/2017 1101   CREATININE 1.5 (H) 10/01/2016 1534      Component Value Date/Time   CALCIUM 9.2 08/26/2020 0904   CALCIUM 9.6 10/01/2016 1534   ALKPHOS 50 08/26/2020 0904   ALKPHOS 65 10/01/2016 1534   AST 26 08/26/2020 0904   AST 20 10/01/2016 1534   ALT 27 08/26/2020 0904   ALT 24 10/01/2016 1534   BILITOT 0.7 08/26/2020 0904   BILITOT 0.46 10/01/2016 1534       RADIOGRAPHIC STUDIES: No results found.   ASSESSMENT AND PLAN: This is a very pleasant 70 years old white male with a stage Ia non-small cell lung cancer status post right upper lobectomy with lymph node dissection on September 02, 2016. The patient he was on observation since that time. Unfortunately there are some concerning findings  with nodular density in the posterior right lower lobe as well as increase and right pleural effusion suspicious for disease recurrence. He underwent ultrasound-guided right thoracentesis and the cytology of the pleural fluid was consistent with recurrent adenocarcinoma. He had molecular studies performed by [facility] 1 that showed positive EGFR mutation with deletion 84. The patient was started on treatment with Tagrisso 80 mg p.o. daily on 11/05/2017.  He status post 36 months of treatment. The patient continues to tolerate his treatment with Tagrisso fairly well with no concerning adverse effects.  I recommended for him to continue his current treatment with Tagrisso with the same dose. I will see him back for follow-up visit in 2 months for evaluation with repeat CT scan of the chest, abdomen pelvis for restaging of his disease. The patient was advised to call immediately if he has any concerning symptoms in the interval. The patient voices understanding of current disease status and treatment options and is in agreement with the current care plan. All questions were answered. The patient knows to call the clinic with any problems, questions or concerns. We can certainly see the patient much sooner if necessary.  Disclaimer: This note was dictated with voice recognition software. Similar sounding words can inadvertently be transcribed and may not be corrected upon review.

## 2020-10-28 NOTE — Telephone Encounter (Signed)
Prescription refill request for Xarelto received.  Indication:DVT Last office visit:1/22 Weight:105.6 kg Age:70 Scr:1.4 CrCl:72.29 ml/min  Prescription refilled

## 2020-11-07 ENCOUNTER — Other Ambulatory Visit (HOSPITAL_COMMUNITY): Payer: Self-pay

## 2020-11-12 ENCOUNTER — Other Ambulatory Visit (HOSPITAL_COMMUNITY): Payer: Self-pay

## 2020-11-28 DIAGNOSIS — D1801 Hemangioma of skin and subcutaneous tissue: Secondary | ICD-10-CM | POA: Diagnosis not present

## 2020-11-28 DIAGNOSIS — L821 Other seborrheic keratosis: Secondary | ICD-10-CM | POA: Diagnosis not present

## 2020-11-28 DIAGNOSIS — L718 Other rosacea: Secondary | ICD-10-CM | POA: Diagnosis not present

## 2020-11-28 DIAGNOSIS — L814 Other melanin hyperpigmentation: Secondary | ICD-10-CM | POA: Diagnosis not present

## 2020-12-05 ENCOUNTER — Other Ambulatory Visit (HOSPITAL_COMMUNITY): Payer: Self-pay

## 2020-12-11 ENCOUNTER — Other Ambulatory Visit (HOSPITAL_COMMUNITY): Payer: Self-pay

## 2020-12-27 ENCOUNTER — Ambulatory Visit (HOSPITAL_COMMUNITY)
Admission: RE | Admit: 2020-12-27 | Discharge: 2020-12-27 | Disposition: A | Discharge status: Home / Self Care | Payer: Medicare PPO | Source: Ambulatory Visit | Attending: Internal Medicine | Admitting: Internal Medicine

## 2020-12-27 ENCOUNTER — Inpatient Hospital Stay: Payer: Medicare PPO | Attending: Internal Medicine

## 2020-12-27 ENCOUNTER — Other Ambulatory Visit: Payer: Self-pay

## 2020-12-27 DIAGNOSIS — C3490 Malignant neoplasm of unspecified part of unspecified bronchus or lung: Secondary | ICD-10-CM | POA: Diagnosis not present

## 2020-12-27 DIAGNOSIS — R911 Solitary pulmonary nodule: Secondary | ICD-10-CM | POA: Diagnosis not present

## 2020-12-27 DIAGNOSIS — Z79899 Other long term (current) drug therapy: Secondary | ICD-10-CM | POA: Insufficient documentation

## 2020-12-27 DIAGNOSIS — K5730 Diverticulosis of large intestine without perforation or abscess without bleeding: Secondary | ICD-10-CM | POA: Diagnosis not present

## 2020-12-27 DIAGNOSIS — I700 Atherosclerosis of aorta: Secondary | ICD-10-CM | POA: Diagnosis not present

## 2020-12-27 DIAGNOSIS — N281 Cyst of kidney, acquired: Secondary | ICD-10-CM | POA: Diagnosis not present

## 2020-12-27 DIAGNOSIS — C3411 Malignant neoplasm of upper lobe, right bronchus or lung: Secondary | ICD-10-CM | POA: Insufficient documentation

## 2020-12-27 DIAGNOSIS — R918 Other nonspecific abnormal finding of lung field: Secondary | ICD-10-CM | POA: Diagnosis not present

## 2020-12-27 LAB — CBC WITH DIFFERENTIAL (CANCER CENTER ONLY)
Abs Immature Granulocytes: 0.02 10*3/uL (ref 0.00–0.07)
Basophils Absolute: 0 10*3/uL (ref 0.0–0.1)
Basophils Relative: 0 %
Eosinophils Absolute: 0.2 10*3/uL (ref 0.0–0.5)
Eosinophils Relative: 2 %
HCT: 44.9 % (ref 39.0–52.0)
Hemoglobin: 15 g/dL (ref 13.0–17.0)
Immature Granulocytes: 0 %
Lymphocytes Relative: 30 %
Lymphs Abs: 2 10*3/uL (ref 0.7–4.0)
MCH: 31.4 pg (ref 26.0–34.0)
MCHC: 33.4 g/dL (ref 30.0–36.0)
MCV: 94.1 fL (ref 80.0–100.0)
Monocytes Absolute: 0.8 10*3/uL (ref 0.1–1.0)
Monocytes Relative: 11 %
Neutro Abs: 3.8 10*3/uL (ref 1.7–7.7)
Neutrophils Relative %: 57 %
Platelet Count: 126 10*3/uL — ABNORMAL LOW (ref 150–400)
RBC: 4.77 MIL/uL (ref 4.22–5.81)
RDW: 14.6 % (ref 11.5–15.5)
WBC Count: 6.8 10*3/uL (ref 4.0–10.5)
nRBC: 0 % (ref 0.0–0.2)

## 2020-12-27 LAB — CMP (CANCER CENTER ONLY)
ALT: 26 U/L (ref 0–44)
AST: 23 U/L (ref 15–41)
Albumin: 3.8 g/dL (ref 3.5–5.0)
Alkaline Phosphatase: 52 U/L (ref 38–126)
Anion gap: 11 (ref 5–15)
BUN: 14 mg/dL (ref 8–23)
CO2: 25 mmol/L (ref 22–32)
Calcium: 9 mg/dL (ref 8.9–10.3)
Chloride: 107 mmol/L (ref 98–111)
Creatinine: 1.37 mg/dL — ABNORMAL HIGH (ref 0.61–1.24)
GFR, Estimated: 55 mL/min — ABNORMAL LOW (ref >60)
Glucose, Bld: 104 mg/dL — ABNORMAL HIGH (ref 70–99)
Potassium: 4.4 mmol/L (ref 3.5–5.1)
Sodium: 143 mmol/L (ref 135–145)
Total Bilirubin: 0.7 mg/dL (ref 0.3–1.2)
Total Protein: 6.6 g/dL (ref 6.5–8.1)

## 2020-12-27 MED ORDER — IOHEXOL 350 MG/ML SOLN
80.0000 mL | Freq: Once | INTRAVENOUS | Status: AC | PRN
  Administered 2020-12-27: 80 mL via INTRAVENOUS

## 2020-12-30 ENCOUNTER — Other Ambulatory Visit: Payer: Self-pay

## 2020-12-30 ENCOUNTER — Inpatient Hospital Stay: Payer: Medicare PPO | Admitting: Internal Medicine

## 2020-12-30 VITALS — BP 150/75 | HR 63 | Temp 97.9°F | Resp 20 | Ht 69.0 in | Wt 229.9 lb

## 2020-12-30 DIAGNOSIS — C3491 Malignant neoplasm of unspecified part of right bronchus or lung: Secondary | ICD-10-CM

## 2020-12-30 DIAGNOSIS — Z79899 Other long term (current) drug therapy: Secondary | ICD-10-CM | POA: Diagnosis not present

## 2020-12-30 DIAGNOSIS — C3411 Malignant neoplasm of upper lobe, right bronchus or lung: Secondary | ICD-10-CM | POA: Diagnosis not present

## 2020-12-30 NOTE — Progress Notes (Signed)
[facility] [phone]   [phone]  OFFICE PROGRESS NOTE  [name], MD [place] [address]  DIAGNOSIS: Recurrent non-small cell lung cancer, adenocarcinoma initially diagnosed as stage IA (T1c, N0, M0) non-small cell lung cancer, adenocarcinoma presented with right upper lobe lung nodule in April 2018 with recurrence in June 2019.  Biomarker Findings Microsatellite status - MS-Stable Tumor Mutational Burden - TMB-Low (3 Muts/Mb) Genomic Findings For a complete list of the genes assayed, please refer to the Appendix. EGFR exon 19 deletion ([id number]) CDKN2A/B loss NKX2-1 amplification 7 Disease relevant genes with no reportable alterations: KRAS, ALK, BRAF, MET, RET, ERBB2, ROS1   PRIOR THERAPY: Status post right upper lobectomy with lymph node dissection under the care of Dr. [name] on 09/02/2016.  CURRENT THERAPY: Tagrisso 80 mg p.o. daily.  First dose started November 05, 2017.  Status post 38 months of treatment.  INTERVAL HISTORY: James [name] 70 y.o. male returns to the clinic today for 88-monthfollow-up visit accompanied by his wife.  The patient is feeling fine today with no concerning complaints except for shortness of breath with exertion.  He denied having any current chest pain, cough or hemoptysis.  He denied having any nausea, vomiting, diarrhea or constipation.  He has no headache or visual changes.  He has no significant weight loss or night sweats.  He continues to tolerate his treatment with Tagrisso fairly well.  The patient had repeat CT scan of the chest, abdomen pelvis performed recently and he is here for evaluation and discussion of his scan results.  MEDICAL HISTORY: Past Medical History:  Diagnosis Date   Adenocarcinoma of right lung, stage 1 ([facility] 09/06/2016   Anxiety    Arthritis    "knees, hips, back" (10/19/2012)   Chronic diastolic congestive heart failure (HCC)    Chronic lower  back pain    Colon polyps    10/27/2002, repeat letter 09/17/2007   Coronary artery disease    Coronary artery disease involving native coronary artery of native heart without angina pectoris    Depressive disorder, not elsewhere classified    no meds   Diabetes mellitus without complication ([facility]    diet controlled- no med  (while in hosp 4/18 -elevated cbg   Dyspnea    Fasting hyperglycemia    GERD (gastroesophageal reflux disease)    Heart murmur    Hemoptysis    abnormal CT Chest 01/29/10 - ? new GG changes RUL > not viz on plain cxr 02/26/2010   Hypertension    Mitral regurgitation    severe MR 08/2016   MPN (myeloproliferative neoplasm) ([facility]    1st detected 06/04/1998   Obesity    OSA on CPAP    last sleep study 10 years ago   Other and unspecified hyperlipidemia    Peripheral vascular disease ([facility] 08/2016   after lung surgey small clots in lungs,after hip dvt-5/16   Pneumonia    4/18   Positive PPD 1965   "non reactive in 2012" (10/19/2012)   Routine general medical examination at a health care facility    S/P CABG x 1 03/24/2017   LIMA to LAD   S/P mitral valve repair 03/24/2017   Complex valvuloplasty including artificial Gore-tex neochord placement x6 and 28 mm Sorin Annuloflex posterior annuloplasty band   Special screening for malignant neoplasm of prostate    Spinal stenosis, unspecified region other than cervical    Wrist pain, left  ALLERGIES:  is allergic to symbicort [budesonide-formoterol fumarate] and amoxicillin.  MEDICATIONS:  Current Outpatient Medications  Medication Sig Dispense Refill   ezetimibe (ZETIA) 10 MG tablet TAKE 1 TABLET BY MOUTH EVERY DAY 90 tablet 3   furosemide (LASIX) 20 MG tablet Take 0.5 tablets (10 mg total) by mouth daily. Take 1/2 tablet 10 mg daily (Patient taking differently: Take 10 mg by mouth daily.) 45 tablet 3   metoprolol tartrate (LOPRESSOR) 25 MG tablet TAKE 0.5 TABLETS (12.5 MG TOTAL) BY MOUTH 2 (TWO) TIMES DAILY. 90  tablet 3   Multiple Vitamin (MULTIVITAMIN) tablet Take 1 tablet by mouth daily.     Omega-3 Fatty Acids (FISH OIL) 500 MG CAPS Take 500 mg by mouth daily.      osimertinib mesylate (TAGRISSO) 80 MG tablet TAKE 1 TABLET (80 MG TOTAL) BY MOUTH DAILY. 30 tablet 2   sildenafil (REVATIO) 20 MG tablet Take 4 tablets (80 mg total) by mouth daily as needed. 60 tablet 5   XARELTO 20 MG TABS tablet TAKE 1 TABLET (20 MG TOTAL) BY MOUTH DAILY WITH SUPPER. 90 tablet 3   No current facility-administered medications for this visit.    SURGICAL HISTORY:  Past Surgical History:  Procedure Laterality Date   ANTERIOR CRUCIATE LIGAMENT REPAIR Left 1967   CARDIAC CATHETERIZATION  2000   CHEST TUBE INSERTION Right 10/19/2012   post bronch   COLONOSCOPY W/ POLYPECTOMY     CORONARY ARTERY BYPASS GRAFT N/A 03/24/2017   Procedure: CORONARY ARTERY BYPASS GRAFTING (CABG)x1 using left internal mammary artery, LIMA-LAD;  Surgeon: Rexene Alberts, MD;  Location: [facility];  Service: Open Heart Surgery;  Laterality: N/A;   FLEXIBLE BRONCHOSCOPY  10/19/2012   Flexible video fiberoptic bronchoscopy with electromagnetic navigation and biopsies.   INTRAVASCULAR PRESSURE WIRE/FFR STUDY N/A 08/11/2016   Procedure: Intravascular Pressure Wire/FFR Study;  Surgeon: [name], MD;  Location: [facility];  Service: Cardiovascular;  Laterality: N/A;   IR ANGIOGRAM PULMONARY BILATERAL SELECTIVE  02/06/2018   IR ANGIOGRAM SELECTIVE EACH ADDITIONAL VESSEL  02/06/2018   IR ANGIOGRAM SELECTIVE EACH ADDITIONAL VESSEL  02/06/2018   IR INFUSION THROMBOL ARTERIAL INITIAL (MS)  02/06/2018   IR INFUSION THROMBOL ARTERIAL INITIAL (MS)  02/06/2018   IR THROMB F/U EVAL ART/VEN FINAL DAY (MS)  02/07/2018   IR US GUIDE VASC ACCESS RIGHT  02/06/2018   LOBECTOMY Right 09/02/2016   Procedure: RIGHT UPPER LOBECTOMY;  Surgeon: Melrose Nakayama, MD;  Location: [facility];  Service: Thoracic;  Laterality: Right;   LYMPH NODE DISSECTION Right 09/02/2016    Procedure: LYMPH NODE DISSECTION, RIGHT LUNG;  Surgeon: Melrose Nakayama, MD;  Location: [facility];  Service: Thoracic;  Laterality: Right;   MITRAL VALVE REPAIR N/A 03/24/2017   Procedure: MITRAL VALVE REPAIR (MVR) with Sorin [employer]omed[employer] Annuloflex size 28;  Surgeon: Rexene Alberts, MD;  Location: [facility];  Service: Open Heart Surgery;  Laterality: N/A;   RIGHT/LEFT HEART CATH AND CORONARY ANGIOGRAPHY N/A 08/11/2016   Procedure: Right/Left Heart Cath and Coronary Angiography;  Surgeon: [name], MD;  Location: [facility];  Service: Cardiovascular;  Laterality: N/A;   TEE WITHOUT CARDIOVERSION N/A 07/17/2016   Procedure: TRANSESOPHAGEAL ECHOCARDIOGRAM (TEE);  Surgeon: [name], MD;  Location: [facility];  Service: Cardiovascular;  Laterality: N/A;   TEE WITHOUT CARDIOVERSION N/A 03/24/2017   Procedure: TRANSESOPHAGEAL ECHOCARDIOGRAM (TEE);  Surgeon: Rexene Alberts, MD;  Location: [facility];  Service: Open Heart Surgery;  Laterality: N/A;  TONSILLECTOMY  1950's   TOTAL HIP ARTHROPLASTY Left 10/05/2014   dr [name]   TOTAL HIP ARTHROPLASTY Left 10/05/2014   Procedure: LEFT TOTAL HIP ARTHROPLASTY ANTERIOR APPROACH;  Surgeon: [name], MD;  Location: [facility];  Service: Orthopedics;  Laterality: Left;   VIDEO ASSISTED THORACOSCOPY (VATS)/WEDGE RESECTION Right 09/02/2016   Procedure: VIDEO ASSISTED THORACOSCOPY (VATS)/RIGHT UPPER LOBE WEDGE RESECTION;  Surgeon: Melrose Nakayama, MD;  Location: [facility];  Service: Thoracic;  Laterality: Right;   VIDEO BRONCHOSCOPY WITH ENDOBRONCHIAL NAVIGATION N/A 10/19/2012   Procedure: VIDEO BRONCHOSCOPY WITH ENDOBRONCHIAL NAVIGATION;  Surgeon: [name], MD;  Location: [facility];  Service: Thoracic;  Laterality: N/A;   WRIST RECONSTRUCTION Left 12/2009   'proximal row carpectomy" [name]    REVIEW OF SYSTEMS:  Constitutional: negative Eyes: negative Ears, nose, mouth, throat, and face: negative Respiratory: positive for dyspnea on  exertion Cardiovascular: negative Gastrointestinal: negative Genitourinary:negative Integument/breast: negative Hematologic/lymphatic: negative Musculoskeletal:negative Neurological: negative Behavioral/Psych: negative Endocrine: negative Allergic/Immunologic: negative   PHYSICAL EXAMINATION: General appearance: alert, cooperative, and no distress Head: Normocephalic, without obvious abnormality, atraumatic Neck: no adenopathy, no JVD, supple, symmetrical, trachea midline, and thyroid not enlarged, symmetric, no tenderness/mass/nodules Lymph nodes: Cervical, supraclavicular, and axillary nodes normal. Resp: clear to auscultation bilaterally Back: symmetric, no curvature. ROM normal. No CVA tenderness. Cardio: regular rate and rhythm, S1, S2 normal, no murmur, click, rub or gallop GI: soft, non-tender; bowel sounds normal; no masses,  no organomegaly Extremities: extremities normal, atraumatic, no cyanosis or edema Neurologic: Alert and oriented X 3, normal strength and tone. Normal symmetric reflexes. Normal coordination and gait  ECOG PERFORMANCE STATUS: 1 - Symptomatic but completely ambulatory  Blood pressure (!) 150/75, pulse 63, temperature 97.9 F (36.6 C), temperature source Tympanic, resp. rate 20, height 5' 9"  (1.753 m), weight 229 lb 14.4 oz (104.3 kg), SpO2 98 %.  LABORATORY DATA: Lab Results  Component Value Date   WBC 6.8 12/27/2020   HGB 15.0 12/27/2020   HCT 44.9 12/27/2020   MCV 94.1 12/27/2020   PLT 126 (L) 12/27/2020      Chemistry      Component Value Date/Time   NA 143 12/27/2020 0902   NA 142 10/01/2016 1534   K 4.4 12/27/2020 0902   K 4.2 10/01/2016 1534   CL 107 12/27/2020 0902   CO2 25 12/27/2020 0902   CO2 28 10/01/2016 1534   BUN 14 12/27/2020 0902   BUN 15.9 10/01/2016 1534   CREATININE 1.37 (H) 12/27/2020 0902   CREATININE 1.10 02/08/2017 1101   CREATININE 1.5 (H) 10/01/2016 1534      Component Value Date/Time   CALCIUM 9.0 12/27/2020  0902   CALCIUM 9.6 10/01/2016 1534   ALKPHOS 52 12/27/2020 0902   ALKPHOS 65 10/01/2016 1534   AST 23 12/27/2020 0902   AST 20 10/01/2016 1534   ALT 26 12/27/2020 0902   ALT 24 10/01/2016 1534   BILITOT 0.7 12/27/2020 0902   BILITOT 0.46 10/01/2016 1534       RADIOGRAPHIC STUDIES: CT Chest W Contrast  Result Date: 12/28/2020 CLINICAL DATA:  Non-small cell lung cancer staging evaluation in a 70 year old male. EXAM: CT CHEST, ABDOMEN, AND PELVIS WITH CONTRAST TECHNIQUE: Multidetector CT imaging of the chest, abdomen and pelvis was performed following the standard protocol during bolus administration of intravenous contrast. CONTRAST:  34m OMNIPAQUE IOHEXOL 350 MG/ML SOLN COMPARISON:  August 26, 2020. FINDINGS: CT CHEST FINDINGS Cardiovascular: Calcified noncalcified plaque in the thoracic aorta. Normal heart size without pericardial effusion. No  signs of aortic or pulmonary arterial dilation. Mediastinum/Nodes: No enlarged mediastinal, hilar, or axillary lymph nodes. Thyroid gland, trachea, and esophagus demonstrate no significant findings. Lungs/Pleura: Post RIGHT upper lobectomy. Peri fissural nodule medially along the minor fissure (image 94/6) 13 mm greatest axial dimension. Small nodule along the periphery of the RIGHT lower lobe (image 71/6) 3-4 mm unchanged as well. Scattered small pulmonary nodules some with calcification in the RIGHT lower lobe with similar appearance. Nodular area at the LEFT lung base in the subpleural lung containing some calcification compatible with scarring unchanged as well. No effusion. No consolidative process. Musculoskeletal: See below for full musculoskeletal details. Spinal degenerative changes about the thoracic spine. Post median sternotomy. Visualized scapulae and clavicles are intact. No destructive bone lesion about the bony thorax. CT ABDOMEN PELVIS FINDINGS Hepatobiliary: Stable hepatic cysts. No suspicious hepatic lesion. Portal vein is patent. No  pericholecystic stranding or sign of biliary duct dilation. Pancreas: Normal, without mass, inflammation or ductal dilatation. Spleen: Spleen normal size and contour. Adrenals/Urinary Tract: Adrenal glands are normal. Symmetric renal enhancement without hydronephrosis. Urinary bladder with smooth contours. Limited assessment due to streak artifact from LEFT hip arthroplasty. LEFT renal cyst arising from the lower pole is unchanged. No suspicious renal lesion. Stomach/Bowel: No acute gastrointestinal process. Colonic diverticulosis. Vascular/Lymphatic: Scattered aortic atherosclerosis without aneurysm. There is no gastrohepatic or hepatoduodenal ligament lymphadenopathy. No retroperitoneal or mesenteric lymphadenopathy. No pelvic sidewall lymphadenopathy. Reproductive: Prostatomegaly as before, unchanged. Other: Small fat containing bilateral inguinal hernias. Musculoskeletal: Spinal degenerative changes. No acute or destructive bone process. Post LEFT hip arthroplasty. IMPRESSION: 1. Post RIGHT upper lobectomy. 2. Stable small pulmonary nodules. 3. No new or progressive findings, no signs of metastatic disease at this time. Continued attention to small peri-fissural nodule in the RIGHT chest is suggested, finding remains stable since December of 2021. 4.  Aortic Atherosclerosis (ICD10-I70.0). Electronically Signed   By: Zetta Bills M.D.   On: 12/28/2020 17:47   CT Abdomen Pelvis W Contrast  Result Date: 12/28/2020 CLINICAL DATA:  Non-small cell lung cancer staging evaluation in a 70 year old male. EXAM: CT CHEST, ABDOMEN, AND PELVIS WITH CONTRAST TECHNIQUE: Multidetector CT imaging of the chest, abdomen and pelvis was performed following the standard protocol during bolus administration of intravenous contrast. CONTRAST:  57m OMNIPAQUE IOHEXOL 350 MG/ML SOLN COMPARISON:  August 26, 2020. FINDINGS: CT CHEST FINDINGS Cardiovascular: Calcified noncalcified plaque in the thoracic aorta. Normal heart size without  pericardial effusion. No signs of aortic or pulmonary arterial dilation. Mediastinum/Nodes: No enlarged mediastinal, hilar, or axillary lymph nodes. Thyroid gland, trachea, and esophagus demonstrate no significant findings. Lungs/Pleura: Post RIGHT upper lobectomy. Peri fissural nodule medially along the minor fissure (image 94/6) 13 mm greatest axial dimension. Small nodule along the periphery of the RIGHT lower lobe (image 71/6) 3-4 mm unchanged as well. Scattered small pulmonary nodules some with calcification in the RIGHT lower lobe with similar appearance. Nodular area at the LEFT lung base in the subpleural lung containing some calcification compatible with scarring unchanged as well. No effusion. No consolidative process. Musculoskeletal: See below for full musculoskeletal details. Spinal degenerative changes about the thoracic spine. Post median sternotomy. Visualized scapulae and clavicles are intact. No destructive bone lesion about the bony thorax. CT ABDOMEN PELVIS FINDINGS Hepatobiliary: Stable hepatic cysts. No suspicious hepatic lesion. Portal vein is patent. No pericholecystic stranding or sign of biliary duct dilation. Pancreas: Normal, without mass, inflammation or ductal dilatation. Spleen: Spleen normal size and contour. Adrenals/Urinary Tract: Adrenal glands are normal. Symmetric renal enhancement  without hydronephrosis. Urinary bladder with smooth contours. Limited assessment due to streak artifact from LEFT hip arthroplasty. LEFT renal cyst arising from the lower pole is unchanged. No suspicious renal lesion. Stomach/Bowel: No acute gastrointestinal process. Colonic diverticulosis. Vascular/Lymphatic: Scattered aortic atherosclerosis without aneurysm. There is no gastrohepatic or hepatoduodenal ligament lymphadenopathy. No retroperitoneal or mesenteric lymphadenopathy. No pelvic sidewall lymphadenopathy. Reproductive: Prostatomegaly as before, unchanged. Other: Small fat containing bilateral  inguinal hernias. Musculoskeletal: Spinal degenerative changes. No acute or destructive bone process. Post LEFT hip arthroplasty. IMPRESSION: 1. Post RIGHT upper lobectomy. 2. Stable small pulmonary nodules. 3. No new or progressive findings, no signs of metastatic disease at this time. Continued attention to small peri-fissural nodule in the RIGHT chest is suggested, finding remains stable since December of 2021. 4.  Aortic Atherosclerosis (ICD10-I70.0). Electronically Signed   By: Zetta Bills M.D.   On: 12/28/2020 17:47     ASSESSMENT AND PLAN: This is a very pleasant 70 years old white male with a stage Ia non-small cell lung cancer status post right upper lobectomy with lymph node dissection on September 02, 2016. The patient he was on observation since that time. Unfortunately there are some concerning findings with nodular density in the posterior right lower lobe as well as increase and right pleural effusion suspicious for disease recurrence. He underwent ultrasound-guided right thoracentesis and the cytology of the pleural fluid was consistent with recurrent adenocarcinoma. He had molecular studies performed by [facility] 1 that showed positive EGFR mutation with deletion 84. The patient was started on treatment with Tagrisso 80 mg p.o. daily on 11/05/2017.  He status post 38 months of treatment. The patient has been tolerating this treatment well with no concerning adverse effects. He had repeat CT scan of the chest, abdomen pelvis performed recently.  I personally and independently reviewed the scans and discussed the results with the patient today. His scan showed no concerning findings for disease progression. I recommended for him to continue his current treatment with Tagrisso with the same dose. I will see him back for follow-up visit in 2 months for evaluation and repeat blood work. The patient was advised to call immediately if he has any concerning symptoms in the interval. The  patient voices understanding of current disease status and treatment options and is in agreement with the current care plan. All questions were answered. The patient knows to call the clinic with any problems, questions or concerns. We can certainly see the patient much sooner if necessary.  Disclaimer: This note was dictated with voice recognition software. Similar sounding words can inadvertently be transcribed and may not be corrected upon review.

## 2021-01-06 ENCOUNTER — Other Ambulatory Visit: Payer: Self-pay | Admitting: Physician Assistant

## 2021-01-06 ENCOUNTER — Other Ambulatory Visit (HOSPITAL_COMMUNITY): Payer: Self-pay

## 2021-01-06 DIAGNOSIS — C3491 Malignant neoplasm of unspecified part of right bronchus or lung: Secondary | ICD-10-CM

## 2021-01-06 DIAGNOSIS — G4733 Obstructive sleep apnea (adult) (pediatric): Secondary | ICD-10-CM | POA: Diagnosis not present

## 2021-01-06 MED ORDER — OSIMERTINIB MESYLATE 80 MG PO TABS
ORAL_TABLET | Freq: Every day | ORAL | 2 refills | Status: DC
  Filled 2021-01-16: qty 30, 30d supply, fill #0
  Filled 2021-02-06: qty 30, 30d supply, fill #1
  Filled 2021-03-06: qty 30, 30d supply, fill #2

## 2021-01-08 ENCOUNTER — Other Ambulatory Visit (HOSPITAL_COMMUNITY): Payer: Self-pay

## 2021-01-15 NOTE — Progress Notes (Signed)
Cardiology Clinic Note   Patient Name: James [name] Date of Encounter: 01/16/2021  Primary Care Provider:  [name], MD Primary Cardiologist:  Peter Martinique, MD  Patient Profile    James [name] 70 year old male presents to the clinic today for follow-up evaluation of his coronary artery disease and chronic diastolic CHF.  Past Medical History    Past Medical History:  Diagnosis Date   Adenocarcinoma of right lung, stage 1 ([place]) 09/06/2016   Anxiety    Arthritis    "knees, hips, back" (10/19/2012)   Chronic diastolic congestive heart failure (HCC)    Chronic lower back pain    Colon polyps    10/27/2002, repeat letter 09/17/2007   Coronary artery disease    Coronary artery disease involving native coronary artery of native heart without angina pectoris    Depressive disorder, not elsewhere classified    no meds   Diabetes mellitus without complication ([place])    diet controlled- no med  (while in hosp 4/18 -elevated cbg   Dyspnea    Fasting hyperglycemia    GERD (gastroesophageal reflux disease)    Heart murmur    Hemoptysis    abnormal CT Chest 01/29/10 - ? new GG changes RUL > not viz on plain cxr 02/26/2010   Hypertension    Mitral regurgitation    severe MR 08/2016   MPN (myeloproliferative neoplasm) ([place])    1st detected 06/04/1998   Obesity    OSA on CPAP    last sleep study 10 years ago   Other and unspecified hyperlipidemia    Peripheral vascular disease ([place]) 08/2016   after lung surgey small clots in lungs,after hip dvt-5/16   Pneumonia    4/18   Positive PPD 1965   "non reactive in 2012" (10/19/2012)   Routine general medical examination at a health care facility    S/P CABG x 1 03/24/2017   LIMA to LAD   S/P mitral valve repair 03/24/2017   Complex valvuloplasty including artificial Gore-tex neochord placement x6 and 28 mm Sorin Annuloflex posterior annuloplasty band   Special screening for malignant neoplasm of prostate    Spinal stenosis,  unspecified region other than cervical    Wrist pain, left    Past Surgical History:  Procedure Laterality Date   ANTERIOR CRUCIATE LIGAMENT REPAIR Left [place]   CHEST TUBE INSERTION Right 10/19/2012   post bronch   COLONOSCOPY W/ POLYPECTOMY     CORONARY ARTERY BYPASS GRAFT N/A 03/24/2017   Procedure: CORONARY ARTERY BYPASS GRAFTING (CABG)x1 using left internal mammary artery, LIMA-LAD;  Surgeon: Rexene Alberts, MD;  Location: [facility];  Service: Open Heart Surgery;  Laterality: N/A;   FLEXIBLE BRONCHOSCOPY  10/19/2012   Flexible video fiberoptic bronchoscopy with electromagnetic navigation and biopsies.   INTRAVASCULAR PRESSURE WIRE/FFR STUDY N/A 08/11/2016   Procedure: Intravascular Pressure Wire/FFR Study;  Surgeon: [name], MD;  Location: [facility];  Service: Cardiovascular;  Laterality: N/A;   IR ANGIOGRAM PULMONARY BILATERAL SELECTIVE  02/06/2018   IR ANGIOGRAM SELECTIVE EACH ADDITIONAL VESSEL  02/06/2018   IR ANGIOGRAM SELECTIVE EACH ADDITIONAL VESSEL  02/06/2018   IR INFUSION THROMBOL ARTERIAL INITIAL (MS)  02/06/2018   IR INFUSION THROMBOL ARTERIAL INITIAL (MS)  02/06/2018   IR THROMB F/U EVAL ART/VEN FINAL DAY (MS)  02/07/2018   IR US GUIDE VASC ACCESS RIGHT  02/06/2018   LOBECTOMY Right 09/02/2016   Procedure: RIGHT UPPER LOBECTOMY;  Surgeon:  Melrose Nakayama, MD;  Location: [place] [facility];  Service: Thoracic;  Laterality: Right;   LYMPH NODE DISSECTION Right 09/02/2016   Procedure: LYMPH NODE DISSECTION, RIGHT LUNG;  Surgeon: Melrose Nakayama, MD;  Location: [facility];  Service: Thoracic;  Laterality: Right;   MITRAL VALVE REPAIR N/A 03/24/2017   Procedure: MITRAL VALVE REPAIR (MVR) with Sorin Carbomedics Annuloflex size 28;  Surgeon: Rexene Alberts, MD;  Location: [place];  Service: Open Heart Surgery;  Laterality: N/A;   RIGHT/LEFT HEART CATH AND CORONARY ANGIOGRAPHY N/A 08/11/2016   Procedure: Right/Left Heart Cath and Coronary Angiography;   Surgeon: [name], MD;  Location: [facility];  Service: Cardiovascular;  Laterality: N/A;   TEE WITHOUT CARDIOVERSION N/A 07/17/2016   Procedure: TRANSESOPHAGEAL ECHOCARDIOGRAM (TEE);  Surgeon: [name], MD;  Location: [facility];  Service: Cardiovascular;  Laterality: N/A;   TEE WITHOUT CARDIOVERSION N/A 03/24/2017   Procedure: TRANSESOPHAGEAL ECHOCARDIOGRAM (TEE);  Surgeon: Rexene Alberts, MD;  Location: [place];  Service: Open Heart Surgery;  Laterality: N/A;   TONSILLECTOMY  1950's   TOTAL HIP ARTHROPLASTY Left 10/05/2014   dr [name]   TOTAL HIP ARTHROPLASTY Left 10/05/2014   Procedure: LEFT TOTAL HIP ARTHROPLASTY ANTERIOR APPROACH;  Surgeon: [name], MD;  Location: [place];  Service: Orthopedics;  Laterality: Left;   VIDEO ASSISTED THORACOSCOPY (VATS)/WEDGE RESECTION Right 09/02/2016   Procedure: VIDEO ASSISTED THORACOSCOPY (VATS)/RIGHT UPPER LOBE WEDGE RESECTION;  Surgeon: Melrose Nakayama, MD;  Location: [place];  Service: Thoracic;  Laterality: Right;   VIDEO BRONCHOSCOPY WITH ENDOBRONCHIAL NAVIGATION N/A 10/19/2012   Procedure: VIDEO BRONCHOSCOPY WITH ENDOBRONCHIAL NAVIGATION;  Surgeon: [name], MD;  Location: [place];  Service: Thoracic;  Laterality: N/A;   WRIST RECONSTRUCTION Left 12/2009   'proximal row carpectomy" [name]    Allergies  Allergies  Allergen Reactions   Symbicort [Budesonide-Formoterol Fumarate] Other (See Comments) and Cough    Chest pain, wheezing    Amoxicillin Itching and Other (See Comments)    Has patient had a PCN reaction causing immediate rash, facial/tongue/throat swelling, SOB or lightheadedness with hypotension: No Has patient had a PCN reaction causing severe rash involving mucus membranes or skin necrosis: No Has patient had a PCN reaction that required hospitalization No Has patient had a PCN reaction occurring within the last 10 years: No If all of the above answers are "NO", then may proceed with Cephalosporin use.      History of Present Illness    James [name] has a PMH of coronary artery disease status post CABG x1 LIMA-LAD in 2018, severe mitral valve regurgitation status post mitral valve repair in conjunction with his CABG, chronic diastolic CHF, recurrent PE, chronic right lower extremity DVT on Xarelto, HTN, HLD, type 2 diabetes, CKD stage III, OSA on CPAP, lung cancer status post right upper lobe lobectomy 4/18, and GERD.  He was referred by [name] and March 2018 for further evaluation of a loud heart murmur and dyspnea.  His echocardiogram showed LVEF 60-65%, normal wall motion and mild MR.  A TEE 3/18 showed LVEF 60 to 65% with flail P3 segment of the mitral leaflet and rupture cord and severe posterior lateral direct MR.  A cardiac catheterization ordered at that time showed borderline single-vessel CAD involving the mid LAD with normal right heart and LV filling pressures.  A PET scan 4/18 showed a hypermetabolic solid 2.6 cm anterior right upper lobe nodule compatible with prior bronchogenic carcinoma.  CT surgery recommended  lobectomy for his lung mass with possible CABG LIMA-LAD and MVR.  He underwent right upper lobe lobectomy 4/18 and pathology showed positive results for adenocarcinoma stage Ia.  Echocardiogram 10/18 showed normal LV function with persistent MR.  He underwent CABG x1 LIMA-LAD and mitral valve repair 11/18 by Dr. Roxy Manns.  He was discharged on Coumadin instead of Xarelto and completed 6 months of anticoagulation for provoked PE and then it was stopped.  He followed up with [name] 9/20.  During that time he noted persistent shortness of breath with exertion as well as chronic lower extremity edema since his PE.  Echocardiogram showed LVEF of 50-55%, G2 DD.  It was felt that CHF was the main cause of his symptoms.  His edema was felt to be more likely due to venous insufficiency post DVT and dyspnea was felt to be more likely pulmonary in nature.  He was started on furosemide 10  mg daily which helped with his lower extremity edema and he was referred to pulmonology.  Pulmonology felt his dyspnea was most likely related to deconditioning.  He presented for follow-up 05/29/2020 with Sande Rives, PA-C.  During that time he presented alone.  He reported that he was doing well.  He had recently been diagnosed with COVID-19 after Christmas but thankfully had a mild case.  He was noted to have sore throat, mild cough, and decreased p.o. intake.  He recovered well.  He remained stable with his chronic shortness of breath.  He felt he was still fairly deconditioned.  Prior to [place] he was walking 3 miles per day and doing well.  Over the month prior to his follow-up visit he had not been walking at all.  He denies shortness of breath.  He was noted to have chronic lower extremity edema left greater than right which was stable.  His weights were stable at home.  He denied chest pain or palpitations.  He did report 1 episode of dizziness just before being diagnosed with COVID.  He denied syncope.  He was compliant with his medications.  He was tolerating Xarelto but did note some gum bleeding.  He did not notice any abnormal bleeding.  He presents to the clinic today for follow-up evaluation states he continues to be physically active walking almost 3 miles per day.  He reports that he continues to have dyspnea with increased physical activity but denies dyspnea with his daily walks.  He feels that he has not fully recovered from his COVID infection.  We reviewed his previous lipid panel and his statin intolerance.  I recommended that he increase the fiber in his diet and increase his physical activity.  We also reviewed his most recent chest CT.  I will give him high-fiber diet instructions, have recommended that he start taking fiber supplement, we will have him continue to increase his physical activity as tolerated and follow-up in 6-9 months with [name].  Today he denies chest pain,  lower extremity edema, fatigue, palpitations, melena, hematuria, hemoptysis, diaphoresis, weakness, presyncope, syncope, orthopnea, and PND.   Home Medications    Prior to Admission medications   Medication Sig Start Date End Date Taking? Authorizing Provider  ezetimibe (ZETIA) 10 MG tablet TAKE 1 TABLET BY MOUTH EVERY DAY 10/28/20   Martinique, Peter M, MD  furosemide (LASIX) 20 MG tablet Take 0.5 tablets (10 mg total) by mouth daily. Take 1/2 tablet 10 mg daily Patient taking differently: Take 10 mg by mouth daily. 10/20/19   Martinique,  Ander Slade, MD  metoprolol tartrate (LOPRESSOR) 25 MG tablet TAKE 0.5 TABLETS (12.5 MG TOTAL) BY MOUTH 2 (TWO) TIMES DAILY. 10/28/20   Martinique, Peter M, MD  Multiple Vitamin (MULTIVITAMIN) tablet Take 1 tablet by mouth daily.    [provider]  Omega-3 Fatty Acids (FISH OIL) 500 MG CAPS Take 500 mg by mouth daily.     [provider]  osimertinib mesylate (TAGRISSO) 80 MG tablet TAKE 1 TABLET (80 MG TOTAL) BY MOUTH DAILY. 01/06/21 01/06/22  Heilingoetter, Cassandra L, PA-C  sildenafil (REVATIO) 20 MG tablet Take 4 tablets (80 mg total) by mouth daily as needed. 12/26/19   [name], MD  XARELTO 20 MG TABS tablet TAKE 1 TABLET (20 MG TOTAL) BY MOUTH DAILY WITH SUPPER. 10/28/20   Martinique, Peter M, MD    Family History    Family History  Problem Relation Age of Onset   Heart failure Mother    Heart disease Father    Heart attack Father    Heart disease Paternal Uncle    Prostate cancer Neg Hx    Colon cancer Neg Hx    Hypertension Neg Hx    Hyperlipidemia Neg Hx    Diabetes Neg Hx    He indicated that his mother is deceased. He indicated that his father is deceased. He indicated that his maternal grandmother is deceased. He indicated that his maternal grandfather is deceased. He indicated that his paternal grandmother is deceased. He indicated that his paternal grandfather is deceased. He indicated that his paternal uncle is deceased. He indicated  that the status of his neg hx is unknown.  Social History    Social History   Socioeconomic History   Marital status: Married    Spouse name: Not on file   Number of children: 3   Years of education: Not on file   Highest education level: Not on file  Occupational History   Occupation: Retired, Financial risk analyst   Occupation: Retired, Real estate    Comment: Slow  Tobacco Use   Smoking status: Never   Smokeless tobacco: Never  Vaping Use   Vaping Use: Never used  Substance and Sexual Activity   Alcohol use: Yes    Alcohol/week: 1.0 standard drink    Types: 1 Cans of beer per week    Comment: none since 3 weeks   Drug use: No   Sexual activity: Never    Partners: Female  Other Topics Concern   Not on file  Social History Narrative   HSG, [employer]. Married '73.  2 sons - '74,   '76;  1 daughter -  '77  6 grandchildren.   Work - IT consultant now retired. ACP - not fully discussed.                   Social Determinants of Health   Financial Resource Strain: Not on file  Food Insecurity: Not on file  Transportation Needs: Not on file  Physical Activity: Not on file  Stress: Not on file  Social Connections: Not on file  Intimate Partner Violence: Not on file     Review of Systems    General:  No chills, fever, night sweats or weight changes.  Cardiovascular:  No chest pain, dyspnea on exertion, edema, orthopnea, palpitations, paroxysmal nocturnal dyspnea. Dermatological: No rash, lesions/masses Respiratory: No cough, dyspnea Urologic: No hematuria, dysuria Abdominal:   No nausea, vomiting, diarrhea, bright red blood per rectum, melena, or hematemesis  Neurologic:  No visual changes, wkns, changes in mental status. All other systems reviewed and are otherwise negative except as noted above.  Physical Exam    VS:  BP 130/80 (BP Location: Right Arm)   Pulse 63   Ht 5\' 9"  (1.753 m)   Wt 229 lb 9.6 oz (104.1 kg)   SpO2 95%   BMI  33.91 kg/m  , BMI Body mass index is 33.91 kg/m. GEN: Well nourished, well developed, in no acute distress. HEENT: normal. Neck: Supple, no JVD, carotid bruits, or masses. Cardiac: RRR, no murmurs, rubs, or gallops. No clubbing, cyanosis, edema.  Radials/DP/PT 2+ and equal bilaterally.  Respiratory:  Respirations regular and unlabored, clear to auscultation bilaterally. GI: Soft, nontender, nondistended, BS + x 4. MS: no deformity or atrophy. Skin: warm and dry, no rash. Neuro:  Strength and sensation are intact. Psych: Normal affect.  Accessory Clinical Findings    Recent Labs: 12/27/2020: ALT 26; BUN 14; Creatinine 1.37; Hemoglobin 15.0; Platelet Count 126; Potassium 4.4; Sodium 143   Recent Lipid Panel    Component Value Date/Time   CHOL 207 (H) 12/26/2019 1150   CHOL 212 (H) 04/21/2019 0849   TRIG 117 12/26/2019 1150   TRIG 108 05/20/2006 0825   HDL 49 12/26/2019 1150   HDL 49 04/21/2019 0849   CHOLHDL 4.2 12/26/2019 1150   VLDL 26.0 12/13/2018 1101   LDLCALC 136 (H) 12/26/2019 1150   LDLDIRECT 177.0 07/24/2015 1052    ECG personally reviewed by me today-normal sinus rhythm no ST or T wave deviation 63 bpm- No acute changes  Right/Left Cardiac Catheterization 08/11/2016: Prox LAD to Mid LAD lesion, 30 %stenosed. Mid LAD to Dist LAD lesion, 65 %stenosed. Prox RCA to Mid RCA lesion, 15 %stenosed. The left ventricular systolic function is normal. LV end diastolic pressure is normal. The left ventricular ejection fraction is 55-65% by visual estimate. LV end diastolic pressure is normal.   1. Borderline single vessel obstructive CAD involving the mid LAD. FFR 0.81 2. Normal LV function EF 65% 3. Normal right heart and LV filling pressures 4. Normal Cardiac output   Plan: will refer to CT surgery for consideration of MV repair. The stenosis in the LAD will be discussed. He is asymptomatic and I would favor treating it medically. If he develops symptoms in the future it  could be treated with PCI.   Echocardiogram 02/08/2019  IMPRESSIONS     1. Left ventricular ejection fraction, by visual estimation, is 50 to  55%. The left ventricle has normal function. Normal left ventricular size.  Left ventricular septal wall thickness was mildly increased. Mildly  increased left ventricular posterior  wall thickness. There is mildly increased left ventricular hypertrophy.   2. Elevated left ventricular end-diastolic pressure.   3. Left ventricular diastolic Doppler parameters are consistent with  pseudonormalization pattern of LV diastolic filling.   4. Global right ventricle has normal systolic function.The right  ventricular size is normal. No increase in right ventricular wall  thickness.   5. Left atrial size was normal.   6. Right atrial size was normal.   7. The mitral valve is normal in structure. Mild mitral valve  regurgitation. Mild mitral stenosis.   8. The tricuspid valve is normal in structure. Tricuspid valve  regurgitation is mild.   9. The aortic valve is normal in structure. Aortic valve regurgitation  was not visualized by color flow Doppler. Structurally normal aortic  valve, with no evidence of sclerosis or stenosis.  10.  Mild thickening and calcification of the aortic valve leaflets.  11. The pulmonic valve was normal in structure. Pulmonic valve  regurgitation is trivial by color flow Doppler.  12. Normal pulmonary artery systolic pressure.  13. The inferior vena cava is normal in size with greater than 50%  respiratory variability, suggesting right atrial pressure of 3 mmHg.   Assessment & Plan   1.  Coronary artery disease-denies chest pain today and recent episodes of arm neck or back discomfort.  Status post CABG x1 LIMA-LAD 11/18. Continue Zetia, metoprolol, furosemide, omega-3 fatty acids, Heart healthy low-sodium diet-salty 6 given Increase physical activity as tolerated  Severe mitral valve regurgitation-no increased DOE or  activity intolerance.  Status post mitral valve repair 11/18.  Echocardiogram 9/20 showed well-functioning mitral valve with mild MS peak gradient 14 mmHg.  Echocardiogram was unchanged from prior 2019 echocardiogram. Continue to monitor  Chronic diastolic CHF-stable .  Echocardiogram 9/20 showed LVEF 50-55% with G2 DD.  Lower extremity edema chronic and stable, generalized bilateral nonpitting edema. Continue furosemide, metoprolol Heart healthy low-sodium diet-salty 6 given Increase physical activity as tolerated  Recurrent pulmonary embolus-chronic DOE.  First episode post right upper lobe lobectomy 4/18 and recurrent PE with mild RV strain 9/19.  Reports compliance with Xarelto and denies bleeding issues. Continue Xarelto  Hypertension-BP today 130/80.  Well-controlled at home. Continue metoprolol Heart healthy low-sodium diet-salty 6 given Increase physical activity as tolerated Maintain blood pressure log  Hyperlipidemia-LDL 136 on 8/21.  LDL goal less than 70 Continue ezetimibe Heart healthy low-sodium high-fiber diet Repeat fasting lipids and LFTs  Lung cancer-chronic DOE.  Status post right upper lobe lobectomy 4/18.  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  Continues on oral chemotherapy. Follows with oncology  Disposition: Follow-up with [name] in 6-9 months.   Jossie Ng. Farren Landa NP-C    01/16/2021, 11:58 AM [address] Office [phone] Fax [phone]  Notice: This dictation was prepared with Dragon dictation along with smaller phrase technology. Any transcriptional errors that result from this process are unintentional and may not be corrected upon review.  I spent 14 minutes examining this patient, reviewing medications, and using patient centered shared decision making involving her cardiac care.  Prior to her visit I spent greater than 20 minutes reviewing her past medical history,  medications, and  prior cardiac tests.

## 2021-01-16 ENCOUNTER — Ambulatory Visit: Payer: Medicare PPO | Admitting: General Practice

## 2021-01-16 ENCOUNTER — Other Ambulatory Visit: Payer: Self-pay

## 2021-01-16 ENCOUNTER — Other Ambulatory Visit (HOSPITAL_COMMUNITY): Payer: Self-pay

## 2021-01-16 ENCOUNTER — Encounter: Payer: Self-pay | Admitting: General Practice

## 2021-01-16 VITALS — BP 130/80 | HR 63 | Ht 69.0 in | Wt 229.6 lb

## 2021-01-16 DIAGNOSIS — Z79899 Other long term (current) drug therapy: Secondary | ICD-10-CM | POA: Diagnosis not present

## 2021-01-16 DIAGNOSIS — Z9889 Other specified postprocedural states: Secondary | ICD-10-CM | POA: Diagnosis not present

## 2021-01-16 DIAGNOSIS — C3491 Malignant neoplasm of unspecified part of right bronchus or lung: Secondary | ICD-10-CM | POA: Diagnosis not present

## 2021-01-16 DIAGNOSIS — I2699 Other pulmonary embolism without acute cor pulmonale: Secondary | ICD-10-CM

## 2021-01-16 DIAGNOSIS — I2510 Atherosclerotic heart disease of native coronary artery without angina pectoris: Secondary | ICD-10-CM

## 2021-01-16 DIAGNOSIS — E785 Hyperlipidemia, unspecified: Secondary | ICD-10-CM

## 2021-01-16 DIAGNOSIS — I10 Essential (primary) hypertension: Secondary | ICD-10-CM

## 2021-01-16 DIAGNOSIS — I5032 Chronic diastolic (congestive) heart failure: Secondary | ICD-10-CM | POA: Diagnosis not present

## 2021-01-16 NOTE — Patient Instructions (Addendum)
Medication Instructions:  The current medical regimen is effective;  continue present plan and medications as directed. Please refer to the Current Medication list given to you today.  *If you need a refill on your cardiac medications before your next appointment, please call your pharmacy*  Lab Work: FASTING LIPID AND LFT AT [place] If you have labs (blood work) drawn today and your tests are completely normal, you will receive your results only by:  [place] (if you have MyChart) OR A paper copy in the mail.  If you have any lab test that is abnormal or we need to change your treatment, we will call you to review the results. You may go to any [facility] that is convenient for you however, we do have a lab in our office that is able to assist you. You DO NOT need an appointment for our lab. The lab is open 8:00am and closes at 4:00pm. Lunch 12:45 - 1:45pm.  Special Instructions PLEASE READ AND FOLLOW INCREASED FIBER DIET-ATTACHED  MAY USE METAMUCIL OR BENEFIBER DAILY  PLEASE READ AND FOLLOW SALTY 6-ATTACHED-1,800 mg daily  PLEASE MAINTAIN PHYSICAL ACTIVITY AS TOLERATED  Follow-Up: Your next appointment:  6-9 month(s) In Person with Peter Martinique, MD ONLY  At [facility], you and your health needs are our priority.  As part of our continuing mission to provide you with exceptional heart care, we have created designated Provider Care Teams.  These Care Teams include your primary Cardiologist (physician) and Advanced Practice Providers (APPs -  Physician Assistants and Nurse Practitioners) who all work together to provide you with the care you need, when you need it.          High-Fiber Eating Plan Fiber, also called dietary fiber, is a type of carbohydrate. It is found foods such as fruits, vegetables, whole grains, and beans. A high-fiber diet can have many health benefits. Your health care provider may recommend a high-fiber diet to help: Prevent constipation.  Fiber can make your bowel movements more regular. Lower your cholesterol. Relieve the following conditions: Inflammation of veins in the anus (hemorrhoids). Inflammation of specific areas of the digestive tract (uncomplicated diverticulosis). A problem of the large intestine, also called the colon, that sometimes causes pain and diarrhea (irritable bowel syndrome, or IBS). Prevent overeating as part of a weight-loss plan. Prevent heart disease, type 2 diabetes, and certain cancers. What are tips for following this plan? Reading food labels  Check the nutrition facts label on food products for the amount of dietary fiber. Choose foods that have 5 grams of fiber or more per serving. The goals for recommended daily fiber intake include: Men (age 23 or younger): 34-38 g. Men (over age 60): 28-34 g. Women (age 11 or younger): 25-28 g. Women (over age 44): 22-25 g. Your daily fiber goal is _____________ g. Shopping Choose whole fruits and vegetables instead of processed forms, such as apple juice or applesauce. Choose a wide variety of high-fiber foods such as avocados, lentils, oats, and kidney beans. Read the nutrition facts label of the foods you choose. Be aware of foods with added fiber. These foods often have high sugar and sodium amounts per serving. Cooking Use whole-grain flour for baking and cooking. Cook with brown rice instead of white rice. Meal planning Start the day with a breakfast that is high in fiber, such as a cereal that contains 5 g of fiber or more per serving. Eat breads and cereals that are made with whole-grain flour instead  of refined flour or white flour. Eat brown rice, bulgur wheat, or millet instead of white rice. Use beans in place of meat in soups, salads, and pasta dishes. Be sure that half of the grains you eat each day are whole grains. General information You can get the recommended daily intake of dietary fiber by: Eating a variety of fruits,  vegetables, grains, nuts, and beans. Taking a fiber supplement if you are not able to take in enough fiber in your diet. It is better to get fiber through food than from a supplement. Gradually increase how much fiber you consume. If you increase your intake of dietary fiber too quickly, you may have bloating, cramping, or gas. Drink plenty of water to help you digest fiber. Choose high-fiber snacks, such as berries, raw vegetables, nuts, and popcorn. What foods should I eat? Fruits Berries. Pears. Apples. Oranges. Avocado. Prunes and raisins. Dried figs. Vegetables Sweet potatoes. Spinach. Kale. Artichokes. Cabbage. Broccoli. Cauliflower. Green peas. Carrots. Squash. Grains Whole-grain breads. Multigrain cereal. Oats and oatmeal. Brown rice. Barley. Bulgur wheat. [place]. Quinoa. Bran muffins. Popcorn. Rye wafer crackers. Meats and other proteins Navy beans, kidney beans, and pinto beans. Soybeans. Split peas. Lentils. Nuts and seeds. Dairy Fiber-fortified yogurt. Beverages Fiber-fortified soy milk. Fiber-fortified orange juice. Other foods Fiber bars. The items listed above may not be a complete list of recommended foods and beverages. Contact a dietitian for more information. What foods should I avoid? Fruits Fruit juice. Cooked, strained fruit. Vegetables Fried potatoes. Canned vegetables. Well-cooked vegetables. Grains White bread. Pasta made with refined flour. White rice. Meats and other proteins Fatty cuts of meat. Fried chicken or fried fish. Dairy Milk. Yogurt. Cream cheese. Sour cream. Fats and oils Butters. Beverages Soft drinks. Other foods Cakes and pastries. The items listed above may not be a complete list of foods and beverages to avoid. Talk with your dietitian about what choices are best for you. Summary Fiber is a type of carbohydrate. It is found in foods such as fruits, vegetables, whole grains, and beans. A high-fiber diet has many benefits. It can help  to prevent constipation, lower blood cholesterol, aid weight loss, and reduce your risk of heart disease, diabetes, and certain cancers. Increase your intake of fiber gradually. Increasing fiber too quickly may cause cramping, bloating, and gas. Drink plenty of water while you increase the amount of fiber you consume. The best sources of fiber include whole fruits and vegetables, whole grains, nuts, seeds, and beans. This information is not intended to replace advice given to you by your health care provider. Make sure you discuss any questions you have with your health care provider. Document Revised: 08/31/2019 Document Reviewed: 08/31/2019 Elsevier Patient Education  2022 [employer].

## 2021-01-22 DIAGNOSIS — Z9889 Other specified postprocedural states: Secondary | ICD-10-CM | POA: Diagnosis not present

## 2021-01-22 DIAGNOSIS — I5032 Chronic diastolic (congestive) heart failure: Secondary | ICD-10-CM | POA: Diagnosis not present

## 2021-01-22 DIAGNOSIS — N1831 Chronic kidney disease, stage 3a: Secondary | ICD-10-CM | POA: Diagnosis not present

## 2021-01-22 DIAGNOSIS — I2510 Atherosclerotic heart disease of native coronary artery without angina pectoris: Secondary | ICD-10-CM | POA: Diagnosis not present

## 2021-01-22 DIAGNOSIS — E785 Hyperlipidemia, unspecified: Secondary | ICD-10-CM | POA: Diagnosis not present

## 2021-01-22 DIAGNOSIS — C3491 Malignant neoplasm of unspecified part of right bronchus or lung: Secondary | ICD-10-CM | POA: Diagnosis not present

## 2021-01-22 DIAGNOSIS — Z79899 Other long term (current) drug therapy: Secondary | ICD-10-CM | POA: Diagnosis not present

## 2021-01-22 DIAGNOSIS — I10 Essential (primary) hypertension: Secondary | ICD-10-CM | POA: Diagnosis not present

## 2021-01-22 DIAGNOSIS — I2699 Other pulmonary embolism without acute cor pulmonale: Secondary | ICD-10-CM | POA: Diagnosis not present

## 2021-01-22 LAB — LIPID PANEL
Chol/HDL Ratio: 4.4 ratio (ref 0.0–5.0)
Cholesterol, Total: 196 mg/dL (ref 100–199)
HDL: 45 mg/dL (ref >39)
LDL Chol Calc (NIH): 123 mg/dL — ABNORMAL HIGH (ref 0–99)
Triglycerides: 155 mg/dL — ABNORMAL HIGH (ref 0–149)
VLDL Cholesterol Cal: 28 mg/dL (ref 5–40)

## 2021-01-22 LAB — HEPATIC FUNCTION PANEL
ALT: 27 IU/L (ref 0–44)
AST: 22 IU/L (ref 0–40)
Albumin: 4.3 g/dL (ref 3.7–4.7)
Alkaline Phosphatase: 58 IU/L (ref 44–121)
Bilirubin Total: 0.6 mg/dL (ref 0.0–1.2)
Bilirubin, Direct: 0.17 mg/dL (ref 0.00–0.40)
Total Protein: 6.3 g/dL (ref 6.0–8.5)

## 2021-01-28 DIAGNOSIS — N1831 Chronic kidney disease, stage 3a: Secondary | ICD-10-CM | POA: Diagnosis not present

## 2021-01-28 DIAGNOSIS — I129 Hypertensive chronic kidney disease with stage 1 through stage 4 chronic kidney disease, or unspecified chronic kidney disease: Secondary | ICD-10-CM | POA: Diagnosis not present

## 2021-01-28 DIAGNOSIS — N2581 Secondary hyperparathyroidism of renal origin: Secondary | ICD-10-CM | POA: Diagnosis not present

## 2021-02-06 ENCOUNTER — Other Ambulatory Visit (HOSPITAL_COMMUNITY): Payer: Self-pay

## 2021-02-11 ENCOUNTER — Other Ambulatory Visit (HOSPITAL_COMMUNITY): Payer: Self-pay

## 2021-03-03 ENCOUNTER — Encounter: Payer: Self-pay | Admitting: Internal Medicine

## 2021-03-03 ENCOUNTER — Other Ambulatory Visit: Payer: Self-pay

## 2021-03-03 ENCOUNTER — Encounter: Payer: Self-pay | Admitting: *Deleted

## 2021-03-03 ENCOUNTER — Inpatient Hospital Stay: Payer: Medicare PPO | Attending: Internal Medicine

## 2021-03-03 ENCOUNTER — Inpatient Hospital Stay (HOSPITAL_BASED_OUTPATIENT_CLINIC_OR_DEPARTMENT_OTHER): Payer: Medicare PPO | Admitting: Internal Medicine

## 2021-03-03 VITALS — BP 130/58 | HR 69 | Temp 99.0°F | Resp 20 | Ht 69.0 in | Wt 232.7 lb

## 2021-03-03 DIAGNOSIS — Z5111 Encounter for antineoplastic chemotherapy: Secondary | ICD-10-CM

## 2021-03-03 DIAGNOSIS — Z79899 Other long term (current) drug therapy: Secondary | ICD-10-CM | POA: Insufficient documentation

## 2021-03-03 DIAGNOSIS — C3411 Malignant neoplasm of upper lobe, right bronchus or lung: Secondary | ICD-10-CM | POA: Insufficient documentation

## 2021-03-03 DIAGNOSIS — R21 Rash and other nonspecific skin eruption: Secondary | ICD-10-CM | POA: Insufficient documentation

## 2021-03-03 DIAGNOSIS — J90 Pleural effusion, not elsewhere classified: Secondary | ICD-10-CM | POA: Diagnosis not present

## 2021-03-03 DIAGNOSIS — Z7901 Long term (current) use of anticoagulants: Secondary | ICD-10-CM | POA: Diagnosis not present

## 2021-03-03 DIAGNOSIS — C3490 Malignant neoplasm of unspecified part of unspecified bronchus or lung: Secondary | ICD-10-CM

## 2021-03-03 DIAGNOSIS — C3491 Malignant neoplasm of unspecified part of right bronchus or lung: Secondary | ICD-10-CM

## 2021-03-03 LAB — CBC WITH DIFFERENTIAL (CANCER CENTER ONLY)
Abs Immature Granulocytes: 0.03 10*3/uL (ref 0.00–0.07)
Basophils Absolute: 0 10*3/uL (ref 0.0–0.1)
Basophils Relative: 0 %
Eosinophils Absolute: 0.1 10*3/uL (ref 0.0–0.5)
Eosinophils Relative: 2 %
HCT: 44.2 % (ref 39.0–52.0)
Hemoglobin: 14.8 g/dL (ref 13.0–17.0)
Immature Granulocytes: 1 %
Lymphocytes Relative: 29 %
Lymphs Abs: 1.9 10*3/uL (ref 0.7–4.0)
MCH: 31.3 pg (ref 26.0–34.0)
MCHC: 33.5 g/dL (ref 30.0–36.0)
MCV: 93.4 fL (ref 80.0–100.0)
Monocytes Absolute: 0.8 10*3/uL (ref 0.1–1.0)
Monocytes Relative: 12 %
Neutro Abs: 3.7 10*3/uL (ref 1.7–7.7)
Neutrophils Relative %: 56 %
Platelet Count: 144 10*3/uL — ABNORMAL LOW (ref 150–400)
RBC: 4.73 MIL/uL (ref 4.22–5.81)
RDW: 14.2 % (ref 11.5–15.5)
WBC Count: 6.5 10*3/uL (ref 4.0–10.5)
nRBC: 0 % (ref 0.0–0.2)

## 2021-03-03 LAB — CMP (CANCER CENTER ONLY)
ALT: 26 U/L (ref 0–44)
AST: 26 U/L (ref 15–41)
Albumin: 3.8 g/dL (ref 3.5–5.0)
Alkaline Phosphatase: 47 U/L (ref 38–126)
Anion gap: 6 (ref 5–15)
BUN: 19 mg/dL (ref 8–23)
CO2: 25 mmol/L (ref 22–32)
Calcium: 9 mg/dL (ref 8.9–10.3)
Chloride: 109 mmol/L (ref 98–111)
Creatinine: 1.33 mg/dL — ABNORMAL HIGH (ref 0.61–1.24)
GFR, Estimated: 57 mL/min — ABNORMAL LOW (ref >60)
Glucose, Bld: 113 mg/dL — ABNORMAL HIGH (ref 70–99)
Potassium: 4.2 mmol/L (ref 3.5–5.1)
Sodium: 140 mmol/L (ref 135–145)
Total Bilirubin: 0.5 mg/dL (ref 0.3–1.2)
Total Protein: 6.7 g/dL (ref 6.5–8.1)

## 2021-03-03 NOTE — Progress Notes (Signed)
I spoke with James [name] today during his visit with Dr. Julien Nordmann.  He is doing well but does state he doesn't have a lot of energy lately and some chest discomfort but that he felt it was reflux.  Dr. Julien Nordmann is aware of his complaints and asked that if his pain recurred to contact cardiologist.  He is to continue his treatment plan at this time.

## 2021-03-03 NOTE — Progress Notes (Signed)
[facility] Telephone:([phone]) [phone]   Fax:(336) [phone]  OFFICE PROGRESS NOTE  James Lima, MD [place] [address]  DIAGNOSIS: Recurrent non-small cell lung cancer, adenocarcinoma initially diagnosed as stage IA (T1c, N0, M0) non-small cell lung cancer, adenocarcinoma presented with right upper lobe lung nodule in April 2018 with recurrence in June 2019.  Biomarker Findings Microsatellite status - MS-Stable Tumor Mutational Burden - TMB-Low (3 Muts/Mb) Genomic Findings For a complete list of the genes assayed, please refer to the Appendix. EGFR exon 19 deletion ([id number]>G) CDKN2A/B loss NKX2-1 amplification 7 Disease relevant genes with no reportable alterations: KRAS, ALK, BRAF, MET, RET, ERBB2, ROS1   PRIOR THERAPY: Status post right upper lobectomy with lymph node dissection under the care of Dr. [name] on 09/02/2016.  CURRENT THERAPY: Tagrisso 80 mg p.o. daily.  First dose started November 05, 2017.  Status post 40 months of treatment.  INTERVAL HISTORY: James [name] 70 y.o. male returns to the clinic today for follow-up visit accompanied by his wife.  The patient is feeling fine today with no concerning complaints except for mild skin rash on the face.  He denied having any chest pain, shortness of breath, cough or hemoptysis.  He denied having any fever or chills.  He has no nausea, vomiting, diarrhea or constipation.  He has no headache or visual changes.  He has no recent weight loss or night sweats.  He is here today for evaluation and repeat blood work.  He continues to tolerate his treatment with Tagrisso fairly well.  MEDICAL HISTORY: Past Medical History:  Diagnosis Date   Adenocarcinoma of right lung, stage 1 ([facility]) 09/06/2016   Anxiety    Arthritis    "knees, hips, back" (10/19/2012)   Chronic diastolic congestive heart failure (HCC)    Chronic lower back pain    Colon polyps    10/27/2002, repeat letter 09/17/2007    Coronary artery disease    Coronary artery disease involving native coronary artery of native heart without angina pectoris    Depressive disorder, not elsewhere classified    no meds   Diabetes mellitus without complication ([facility])    diet controlled- no med  (while in hosp 4/18 -elevated cbg   Dyspnea    Fasting hyperglycemia    GERD (gastroesophageal reflux disease)    Heart murmur    Hemoptysis    abnormal CT Chest 01/29/10 - ? new GG changes RUL > not viz on plain cxr 02/26/2010   Hypertension    Mitral regurgitation    severe MR 08/2016   MPN (myeloproliferative neoplasm) ([facility])    1st detected 06/04/1998   Obesity    OSA on CPAP    last sleep study 10 years ago   Other and unspecified hyperlipidemia    Peripheral vascular disease ([facility]) 08/2016   after lung surgey small clots in lungs,after hip dvt-5/16   Pneumonia    4/18   Positive PPD 1965   "non reactive in 2012" (10/19/2012)   Routine general medical examination at a health care facility    S/P CABG x 1 03/24/2017   [name] to LAD   S/P mitral valve repair 03/24/2017   Complex valvuloplasty including artificial Gore-tex neochord placement x6 and 28 mm Sorin Annuloflex posterior annuloplasty band   Special screening for malignant neoplasm of prostate    Spinal stenosis, unspecified region other than cervical    Wrist pain, left     ALLERGIES:  is allergic to symbicort [budesonide-formoterol fumarate] and amoxicillin.  MEDICATIONS:  Current Outpatient Medications  Medication Sig Dispense Refill   ezetimibe (ZETIA) 10 MG tablet TAKE 1 TABLET BY MOUTH EVERY DAY 90 tablet 3   furosemide (LASIX) 20 MG tablet Take 0.5 tablets (10 mg total) by mouth daily. Take 1/2 tablet 10 mg daily (Patient taking differently: Take 10 mg by mouth daily.) 45 tablet 3   metoprolol tartrate (LOPRESSOR) 25 MG tablet TAKE 0.5 TABLETS (12.5 MG TOTAL) BY MOUTH 2 (TWO) TIMES DAILY. 90 tablet 3   Multiple Vitamin (MULTIVITAMIN) tablet Take 1 tablet by  mouth daily.     Omega-3 Fatty Acids (FISH OIL) 500 MG CAPS Take 500 mg by mouth daily.      osimertinib mesylate (TAGRISSO) 80 MG tablet TAKE 1 TABLET (80 MG TOTAL) BY MOUTH DAILY. 30 tablet 2   sildenafil (REVATIO) 20 MG tablet Take 4 tablets (80 mg total) by mouth daily as needed. 60 tablet 5   XARELTO 20 MG TABS tablet TAKE 1 TABLET (20 MG TOTAL) BY MOUTH DAILY WITH SUPPER. 90 tablet 3   No current facility-administered medications for this visit.    SURGICAL HISTORY:  Past Surgical History:  Procedure Laterality Date   ANTERIOR CRUCIATE LIGAMENT REPAIR Left 1967   CARDIAC CATHETERIZATION  2000   CHEST TUBE INSERTION Right 10/19/2012   post bronch   COLONOSCOPY W/ POLYPECTOMY     CORONARY ARTERY BYPASS GRAFT N/A 03/24/2017   Procedure: CORONARY ARTERY BYPASS GRAFTING (CABG)x1 using left internal mammary artery, [name]-LAD;  Surgeon: Rexene Alberts, MD;  Location: [facility];  Service: Open Heart Surgery;  Laterality: N/A;   FLEXIBLE BRONCHOSCOPY  10/19/2012   Flexible video fiberoptic bronchoscopy with electromagnetic navigation and biopsies.   INTRAVASCULAR PRESSURE WIRE/FFR STUDY N/A 08/11/2016   Procedure: Intravascular Pressure Wire/FFR Study;  Surgeon: [name], MD;  Location: [facility];  Service: Cardiovascular;  Laterality: N/A;   IR ANGIOGRAM PULMONARY BILATERAL SELECTIVE  02/06/2018   IR ANGIOGRAM SELECTIVE EACH ADDITIONAL VESSEL  02/06/2018   IR ANGIOGRAM SELECTIVE EACH ADDITIONAL VESSEL  02/06/2018   IR INFUSION THROMBOL ARTERIAL INITIAL (MS)  02/06/2018   IR INFUSION THROMBOL ARTERIAL INITIAL (MS)  02/06/2018   IR THROMB F/U EVAL ART/VEN FINAL DAY (MS)  02/07/2018   IR US GUIDE VASC ACCESS RIGHT  02/06/2018   LOBECTOMY Right 09/02/2016   Procedure: RIGHT UPPER LOBECTOMY;  Surgeon: Melrose Nakayama, MD;  Location: [facility];  Service: Thoracic;  Laterality: Right;   LYMPH NODE DISSECTION Right 09/02/2016   Procedure: LYMPH NODE DISSECTION, RIGHT LUNG;  Surgeon: Melrose Nakayama, MD;  Location: [facility];  Service: Thoracic;  Laterality: Right;   MITRAL VALVE REPAIR N/A 03/24/2017   Procedure: MITRAL VALVE REPAIR (MVR) with Sorin Carbomedics Annuloflex size 28;  Surgeon: Rexene Alberts, MD;  Location: [facility];  Service: Open Heart Surgery;  Laterality: N/A;   RIGHT/LEFT HEART CATH AND CORONARY ANGIOGRAPHY N/A 08/11/2016   Procedure: Right/Left Heart Cath and Coronary Angiography;  Surgeon: [name], MD;  Location: [facility];  Service: Cardiovascular;  Laterality: N/A;   TEE WITHOUT CARDIOVERSION N/A 07/17/2016   Procedure: TRANSESOPHAGEAL ECHOCARDIOGRAM (TEE);  Surgeon: [name], MD;  [facility];  Service: Cardiovascular;  Laterality: N/A;   TEE WITHOUT CARDIOVERSION N/A 03/24/2017   Procedure: TRANSESOPHAGEAL ECHOCARDIOGRAM (TEE);  Surgeon: Rexene Alberts, MD;  Location: [facility];  Service: Open Heart Surgery;  Laterality: N/A;  TONSILLECTOMY  1950's   TOTAL HIP ARTHROPLASTY Left 10/05/2014   dr [name]   TOTAL HIP ARTHROPLASTY Left 10/05/2014   Procedure: LEFT TOTAL HIP ARTHROPLASTY ANTERIOR APPROACH;  Surgeon: [name], MD;  Location: [facility];  Service: Orthopedics;  Laterality: Left;   VIDEO ASSISTED THORACOSCOPY (VATS)/WEDGE RESECTION Right 09/02/2016   Procedure: VIDEO ASSISTED THORACOSCOPY (VATS)/RIGHT UPPER LOBE WEDGE RESECTION;  Surgeon: Melrose Nakayama, MD;  Location: [facility];  Service: Thoracic;  Laterality: Right;   VIDEO BRONCHOSCOPY WITH ENDOBRONCHIAL NAVIGATION N/A 10/19/2012   Procedure: VIDEO BRONCHOSCOPY WITH ENDOBRONCHIAL NAVIGATION;  Surgeon: [name], MD;  Location: [facility];  Service: Thoracic;  Laterality: N/A;   WRIST RECONSTRUCTION Left 12/2009   'proximal row carpectomy" [name]    REVIEW OF SYSTEMS:  A comprehensive review of systems was negative except for: Integument/breast: positive for rash   PHYSICAL EXAMINATION: General appearance: alert, cooperative, and no distress Head: Normocephalic,  without obvious abnormality, atraumatic Neck: no adenopathy, no JVD, supple, symmetrical, trachea midline, and thyroid not enlarged, symmetric, no tenderness/mass/nodules Lymph nodes: Cervical, supraclavicular, and axillary nodes normal. Resp: clear to auscultation bilaterally Back: symmetric, no curvature. ROM normal. No CVA tenderness. Cardio: regular rate and rhythm, S1, S2 normal, no murmur, click, rub or gallop GI: soft, non-tender; bowel sounds normal; no masses,  no organomegaly Extremities: extremities normal, atraumatic, no cyanosis or edema  ECOG PERFORMANCE STATUS: 1 - Symptomatic but completely ambulatory  Blood pressure (!) 130/58, pulse 69, temperature 99 F (37.2 C), temperature source Oral, resp. rate 20, height _0  (1.753 m), weight 232 lb 11.2 oz (105.6 kg), SpO2 97 %.  LABORATORY DATA: Lab Results  Component Value Date   WBC 6.5 03/03/2021   HGB 14.8 03/03/2021   HCT 44.2 03/03/2021   MCV 93.4 03/03/2021   PLT 144 (L) 03/03/2021      Chemistry      Component Value Date/Time   NA 143 12/27/2020 0902   NA 142 10/01/2016 1534   K 4.4 12/27/2020 0902   K 4.2 10/01/2016 1534   CL 107 12/27/2020 0902   CO2 25 12/27/2020 0902   CO2 28 10/01/2016 1534   BUN 14 12/27/2020 0902   BUN 15.9 10/01/2016 1534   CREATININE 1.37 (H) 12/27/2020 0902   CREATININE 1.10 02/08/2017 1101   CREATININE 1.5 (H) 10/01/2016 1534      Component Value Date/Time   CALCIUM 9.0 12/27/2020 0902   CALCIUM 9.6 10/01/2016 1534   ALKPHOS 58 01/22/2021 0829   ALKPHOS 65 10/01/2016 1534   AST 22 01/22/2021 0829   AST 23 12/27/2020 0902   AST 20 10/01/2016 1534   ALT 27 01/22/2021 0829   ALT 26 12/27/2020 0902   ALT 24 10/01/2016 1534   BILITOT 0.6 01/22/2021 0829   BILITOT 0.7 12/27/2020 0902   BILITOT 0.46 10/01/2016 1534       RADIOGRAPHIC STUDIES: No results found.   ASSESSMENT AND PLAN: This is a very pleasant 70 years old white male with a stage Ia non-small cell lung  cancer status post right upper lobectomy with lymph node dissection on September 02, 2016. The patient he was on observation since that time. Unfortunately there are some concerning findings with nodular density in the posterior right lower lobe as well as increase and right pleural effusion suspicious for disease recurrence. He underwent ultrasound-guided right thoracentesis and the cytology of the pleural fluid was consistent with recurrent adenocarcinoma. He had molecular studies performed by foundation 1 that showed positive EGFR  mutation with deletion 74. The patient was started on treatment with Tagrisso 80 mg p.o. daily on 11/05/2017.  He status post 40 months of treatment. The patient continues to tolerate this treatment well with no concerning adverse effect except for mild skin rash. I recommended for him to proceed with current systemic therapy with Tagrisso at the same dose. I will see him back for follow-up visit in 2 months for evaluation and repeat CT scan of the chest, abdomen and pelvis for restaging of his disease. For the intermittent chest pain, I advised the patient to reach out to his cardiologist for further evaluation and management. He was advised to call immediately if he has any concerning symptoms in the interval. The patient voices understanding of current disease status and treatment options and is in agreement with the current care plan. All questions were answered. The patient knows to call the clinic with any problems, questions or concerns. We can certainly see the patient much sooner if necessary.  Disclaimer: This note was dictated with voice recognition software. Similar sounding words can inadvertently be transcribed and may not be corrected upon review.

## 2021-03-06 ENCOUNTER — Ambulatory Visit (INDEPENDENT_AMBULATORY_CARE_PROVIDER_SITE_OTHER): Payer: Medicare PPO | Admitting: Internal Medicine

## 2021-03-06 ENCOUNTER — Other Ambulatory Visit (HOSPITAL_COMMUNITY): Payer: Self-pay

## 2021-03-06 ENCOUNTER — Encounter: Payer: Self-pay | Admitting: Internal Medicine

## 2021-03-06 VITALS — BP 124/76 | HR 61 | Temp 98.5°F | Ht 69.0 in | Wt 231.0 lb

## 2021-03-06 DIAGNOSIS — Z0000 Encounter for general adult medical examination without abnormal findings: Secondary | ICD-10-CM | POA: Diagnosis not present

## 2021-03-06 DIAGNOSIS — N138 Other obstructive and reflux uropathy: Secondary | ICD-10-CM | POA: Diagnosis not present

## 2021-03-06 DIAGNOSIS — E118 Type 2 diabetes mellitus with unspecified complications: Secondary | ICD-10-CM

## 2021-03-06 DIAGNOSIS — N401 Enlarged prostate with lower urinary tract symptoms: Secondary | ICD-10-CM | POA: Diagnosis not present

## 2021-03-06 DIAGNOSIS — E785 Hyperlipidemia, unspecified: Secondary | ICD-10-CM

## 2021-03-06 DIAGNOSIS — I5032 Chronic diastolic (congestive) heart failure: Secondary | ICD-10-CM | POA: Diagnosis not present

## 2021-03-06 DIAGNOSIS — Z23 Encounter for immunization: Secondary | ICD-10-CM | POA: Diagnosis not present

## 2021-03-06 DIAGNOSIS — D696 Thrombocytopenia, unspecified: Secondary | ICD-10-CM

## 2021-03-06 DIAGNOSIS — T466X5A Adverse effect of antihyperlipidemic and antiarteriosclerotic drugs, initial encounter: Secondary | ICD-10-CM

## 2021-03-06 DIAGNOSIS — G720 Drug-induced myopathy: Secondary | ICD-10-CM | POA: Diagnosis not present

## 2021-03-06 DIAGNOSIS — N1831 Chronic kidney disease, stage 3a: Secondary | ICD-10-CM | POA: Diagnosis not present

## 2021-03-06 LAB — URINALYSIS, ROUTINE W REFLEX MICROSCOPIC
Bilirubin Urine: NEGATIVE
Hgb urine dipstick: NEGATIVE
Ketones, ur: NEGATIVE
Leukocytes,Ua: NEGATIVE
Nitrite: NEGATIVE
RBC / HPF: NONE SEEN (ref 0–?)
Specific Gravity, Urine: 1.01 (ref 1.000–1.030)
Total Protein, Urine: NEGATIVE
Urine Glucose: NEGATIVE
Urobilinogen, UA: 0.2 (ref 0.0–1.0)
pH: 5.5 (ref 5.0–8.0)

## 2021-03-06 LAB — TSH: TSH: 1.85 u[IU]/mL (ref 0.35–5.50)

## 2021-03-06 LAB — PSA: PSA: 1.52 ng/mL (ref 0.10–4.00)

## 2021-03-06 LAB — VITAMIN B12: Vitamin B-12: 739 pg/mL (ref 211–911)

## 2021-03-06 LAB — FOLATE: Folate: >23.4 ng/mL (ref >5.9)

## 2021-03-06 LAB — MICROALBUMIN / CREATININE URINE RATIO
Creatinine,U: 48.2 mg/dL
Microalb Creat Ratio: 1.5 mg/g (ref 0.0–30.0)
Microalb, Ur: <0.7 mg/dL (ref 0.0–1.9)

## 2021-03-06 LAB — HEMOGLOBIN A1C: Hgb A1c MFr Bld: 6.2 % (ref 4.6–6.5)

## 2021-03-06 NOTE — Patient Instructions (Signed)
Health Maintenance, Male Adopting a healthy lifestyle and getting preventive care are important in promoting health and wellness. Ask your health care provider about: The right schedule for you to have regular tests and exams. Things you can do on your own to prevent diseases and keep yourself healthy. What should I know about diet, weight, and exercise? Eat a healthy diet  Eat a diet that includes plenty of vegetables, fruits, low-fat dairy products, and lean protein. Do not eat a lot of foods that are high in solid fats, added sugars, or sodium. Maintain a healthy weight Body mass index (BMI) is a measurement that can be used to identify possible weight problems. It estimates body fat based on height and weight. Your health care provider can help determine your BMI and help you achieve or maintain a healthy weight. Get regular exercise Get regular exercise. This is one of the most important things you can do for your health. Most adults should: Exercise for at least 150 minutes each week. The exercise should increase your heart rate and make you sweat (moderate-intensity exercise). Do strengthening exercises at least twice a week. This is in addition to the moderate-intensity exercise. Spend less time sitting. Even light physical activity can be beneficial. Watch cholesterol and blood lipids Have your blood tested for lipids and cholesterol at 71 years of age, then have this test every 5 years. You may need to have your cholesterol levels checked more often if: Your lipid or cholesterol levels are high. You are older than 71 years of age. You are at high risk for heart disease. What should I know about cancer screening? Many types of cancers can be detected early and may often be prevented. Depending on your health history and family history, you may need to have cancer screening at various ages. This may include screening for: Colorectal cancer. Prostate cancer. Skin cancer. Lung  cancer. What should I know about heart disease, diabetes, and high blood pressure? Blood pressure and heart disease High blood pressure causes heart disease and increases the risk of stroke. This is more likely to develop in people who have high blood pressure readings, are of African descent, or are overweight. Talk with your health care provider about your target blood pressure readings. Have your blood pressure checked: Every 3-5 years if you are 18-39 years of age. Every year if you are 40 years old or older. If you are between the ages of 65 and 75 and are a current or former smoker, ask your health care provider if you should have a one-time screening for abdominal aortic aneurysm (AAA). Diabetes Have regular diabetes screenings. This checks your fasting blood sugar level. Have the screening done: Once every three years after age 45 if you are at a normal weight and have a low risk for diabetes. More often and at a younger age if you are overweight or have a high risk for diabetes. What should I know about preventing infection? Hepatitis B If you have a higher risk for hepatitis B, you should be screened for this virus. Talk with your health care provider to find out if you are at risk for hepatitis B infection. Hepatitis C Blood testing is recommended for: Everyone born from 1945 through 1965. Anyone with known risk factors for hepatitis C. Sexually transmitted infections (STIs) You should be screened each year for STIs, including gonorrhea and chlamydia, if: You are sexually active and are younger than 71 years of age. You are older than 71 years   of age and your health care provider tells you that you are at risk for this type of infection. Your sexual activity has changed since you were last screened, and you are at increased risk for chlamydia or gonorrhea. Ask your health care provider if you are at risk. Ask your health care provider about whether you are at high risk for HIV.  Your health care provider may recommend a prescription medicine to help prevent HIV infection. If you choose to take medicine to prevent HIV, you should first get tested for HIV. You should then be tested every 3 months for as long as you are taking the medicine. Follow these instructions at home: Lifestyle Do not use any products that contain nicotine or tobacco, such as cigarettes, e-cigarettes, and chewing tobacco. If you need help quitting, ask your health care provider. Do not use street drugs. Do not share needles. Ask your health care provider for help if you need support or information about quitting drugs. Alcohol use Do not drink alcohol if your health care provider tells you not to drink. If you drink alcohol: Limit how much you have to 0-2 drinks a day. Be aware of how much alcohol is in your drink. In the [place] one drink equals one 12 oz bottle of beer (355 mL), one 5 oz glass of wine (148 mL), or one 1 oz glass of hard liquor (44 mL). General instructions Schedule regular health, dental, and eye exams. Stay current with your vaccines. Tell your health care provider if: You often feel depressed. You have ever been abused or do not feel safe at home. Summary Adopting a healthy lifestyle and getting preventive care are important in promoting health and wellness. Follow your health care provider's instructions about healthy diet, exercising, and getting tested or screened for diseases. Follow your health care provider's instructions on monitoring your cholesterol and blood pressure. This information is not intended to replace advice given to you by your health care provider. Make sure you discuss any questions you have with your health care provider. Document Revised: 07/05/2020 Document Reviewed: 04/20/2018 Elsevier Patient Education  2022 [employer]  

## 2021-03-06 NOTE — Progress Notes (Signed)
Subjective:  Patient ID: James [name], male    DOB: Jul 09, 1950  Age: 70 y.o. MRN: [id number]  CC: Annual Exam  This visit occurred during the SARS-CoV-2 public health emergency.  Safety protocols were in place, including screening questions prior to the visit, additional usage of staff PPE, and extensive cleaning of exam room while observing appropriate contact time as indicated for disinfecting solutions.    HPI KAISEI GILBO presents for a CPX and f/up -   He has chronic, unchanged DOE/SOB. He denies CP, diaphoresis, palpitations, dizziness, lightheadedness.  Outpatient Medications Prior to Visit  Medication Sig Dispense Refill   ezetimibe (ZETIA) 10 MG tablet TAKE 1 TABLET BY MOUTH EVERY DAY 90 tablet 3   furosemide (LASIX) 20 MG tablet Take 0.5 tablets (10 mg total) by mouth daily. Take 1/2 tablet 10 mg daily (Patient taking differently: Take 10 mg by mouth daily.) 45 tablet 3   metoprolol tartrate (LOPRESSOR) 25 MG tablet TAKE 0.5 TABLETS (12.5 MG TOTAL) BY MOUTH 2 (TWO) TIMES DAILY. 90 tablet 3   metroNIDAZOLE (METROCREAM) 0.75 % cream Apply 1 application topically in the morning and at bedtime.     Multiple Vitamin (MULTIVITAMIN) tablet Take 1 tablet by mouth daily.     Omega-3 Fatty Acids (FISH OIL) 500 MG CAPS Take 500 mg by mouth daily.      osimertinib mesylate (TAGRISSO) 80 MG tablet TAKE 1 TABLET (80 MG TOTAL) BY MOUTH DAILY. 30 tablet 2   sildenafil (REVATIO) 20 MG tablet Take 4 tablets (80 mg total) by mouth daily as needed. 60 tablet 5   XARELTO 20 MG TABS tablet TAKE 1 TABLET (20 MG TOTAL) BY MOUTH DAILY WITH SUPPER. 90 tablet 3   No facility-administered medications prior to visit.    ROS Review of Systems  Constitutional:  Negative for chills, diaphoresis, fatigue and fever.  HENT: Negative.    Eyes: Negative.   Respiratory:  Positive for shortness of breath. Negative for cough, chest tightness and wheezing.   Cardiovascular:  Positive for leg swelling.  Negative for chest pain and palpitations.  Gastrointestinal:  Negative for abdominal pain, constipation, diarrhea, nausea and vomiting.  Endocrine: Negative.   Genitourinary: Negative.  Negative for difficulty urinating, hematuria, scrotal swelling and testicular pain.  Musculoskeletal: Negative.  Negative for arthralgias and myalgias.  Skin: Negative.   Neurological: Negative.  Negative for dizziness, weakness, light-headedness and numbness.  Hematological:  Negative for adenopathy. Does not bruise/bleed easily.  Psychiatric/Behavioral: Negative.     Objective:  BP 124/76 (BP Location: Left Arm, Patient Position: Sitting, Cuff Size: Large)   Pulse 61   Temp 98.5 F (36.9 C) (Oral)   Ht 5\' 9"  (1.753 m)   Wt 231 lb (104.8 kg)   SpO2 97%   BMI 34.11 kg/m   BP Readings from Last 3 Encounters:  03/06/21 124/76  03/03/21 (!) 130/58  01/16/21 130/80    Wt Readings from Last 3 Encounters:  03/06/21 231 lb (104.8 kg)  03/03/21 232 lb 11.2 oz (105.6 kg)  01/16/21 229 lb 9.6 oz (104.1 kg)    Physical Exam Vitals reviewed.  Constitutional:      General: He is not in acute distress.    Appearance: He is obese. He is not toxic-appearing or diaphoretic.  HENT:     Nose: Nose normal.     Mouth/Throat:     Mouth: Mucous membranes are moist.  Eyes:     Conjunctiva/sclera: Conjunctivae normal.  Cardiovascular:  Rate and Rhythm: Normal rate and regular rhythm.     Heart sounds: S1 normal and S2 normal. Murmur heard.  Systolic murmur is present with a grade of 3/6.    No gallop.  Pulmonary:     Effort: Pulmonary effort is normal.     Breath sounds: No stridor. No wheezing, rhonchi or rales.  Abdominal:     General: Abdomen is protuberant. Bowel sounds are normal. There is no distension.     Palpations: Abdomen is soft. There is no fluid wave, hepatomegaly, splenomegaly or mass.     Tenderness: There is no abdominal tenderness.     Hernia: There is no hernia in the left inguinal  area or right inguinal area.  Genitourinary:    Pubic Area: No rash.      Penis: Normal and circumcised.      Testes: Normal.        Right: Mass or tenderness not present.        Left: Mass or tenderness not present.     Epididymis:     Right: Normal. Not inflamed or enlarged. No mass.     Left: Normal. Not inflamed or enlarged. No mass.     Prostate: Enlarged. Not tender and no nodules present.     Rectum: Normal. Guaiac result negative. No mass, tenderness, anal fissure, external hemorrhoid or internal hemorrhoid. Normal anal tone.  Musculoskeletal:     Cervical back: Neck supple.     Right lower leg: 2+ Edema present.     Left lower leg: 2+ Edema present.  Lymphadenopathy:     Cervical: No cervical adenopathy.     Lower Body: No right inguinal adenopathy. No left inguinal adenopathy.  Neurological:     Mental Status: He is alert.    Lab Results  Component Value Date   WBC 6.5 03/03/2021   HGB 14.8 03/03/2021   HCT 44.2 03/03/2021   PLT 144 (L) 03/03/2021   GLUCOSE 113 (H) 03/03/2021   CHOL 196 01/22/2021   TRIG 155 (H) 01/22/2021   HDL 45 01/22/2021   LDLDIRECT 177.0 07/24/2015   LDLCALC 123 (H) 01/22/2021   ALT 26 03/03/2021   AST 26 03/03/2021   NA 140 03/03/2021   K 4.2 03/03/2021   CL 109 03/03/2021   CREATININE 1.33 (H) 03/03/2021   BUN 19 03/03/2021   CO2 25 03/03/2021   TSH 1.85 03/06/2021   PSA 1.52 03/06/2021   INR 1.28 02/07/2018   HGBA1C 6.2 03/06/2021   MICROALBUR <0.7 03/06/2021    CT Chest W Contrast  Result Date: 12/28/2020 CLINICAL DATA:  Non-small cell lung cancer staging evaluation in a 70 year old male. EXAM: CT CHEST, ABDOMEN, AND PELVIS WITH CONTRAST TECHNIQUE: Multidetector CT imaging of the chest, abdomen and pelvis was performed following the standard protocol during bolus administration of intravenous contrast. CONTRAST:  39mL OMNIPAQUE IOHEXOL 350 MG/ML SOLN COMPARISON:  August 26, 2020. FINDINGS: CT CHEST FINDINGS Cardiovascular:  Calcified noncalcified plaque in the thoracic aorta. Normal heart size without pericardial effusion. No signs of aortic or pulmonary arterial dilation. Mediastinum/Nodes: No enlarged mediastinal, hilar, or axillary lymph nodes. Thyroid gland, trachea, and esophagus demonstrate no significant findings. Lungs/Pleura: Post RIGHT upper lobectomy. Peri fissural nodule medially along the minor fissure (image 94/6) 13 mm greatest axial dimension. Small nodule along the periphery of the RIGHT lower lobe (image 71/6) 3-4 mm unchanged as well. Scattered small pulmonary nodules some with calcification in the RIGHT lower lobe with similar appearance. Nodular area at the  LEFT lung base in the subpleural lung containing some calcification compatible with scarring unchanged as well. No effusion. No consolidative process. Musculoskeletal: See below for full musculoskeletal details. Spinal degenerative changes about the thoracic spine. Post median sternotomy. Visualized scapulae and clavicles are intact. No destructive bone lesion about the bony thorax. CT ABDOMEN PELVIS FINDINGS Hepatobiliary: Stable hepatic cysts. No suspicious hepatic lesion. Portal vein is patent. No pericholecystic stranding or sign of biliary duct dilation. Pancreas: Normal, without mass, inflammation or ductal dilatation. Spleen: Spleen normal size and contour. Adrenals/Urinary Tract: Adrenal glands are normal. Symmetric renal enhancement without hydronephrosis. Urinary bladder with smooth contours. Limited assessment due to streak artifact from LEFT hip arthroplasty. LEFT renal cyst arising from the lower pole is unchanged. No suspicious renal lesion. Stomach/Bowel: No acute gastrointestinal process. Colonic diverticulosis. Vascular/Lymphatic: Scattered aortic atherosclerosis without aneurysm. There is no gastrohepatic or hepatoduodenal ligament lymphadenopathy. No retroperitoneal or mesenteric lymphadenopathy. No pelvic sidewall lymphadenopathy.  Reproductive: Prostatomegaly as before, unchanged. Other: Small fat containing bilateral inguinal hernias. Musculoskeletal: Spinal degenerative changes. No acute or destructive bone process. Post LEFT hip arthroplasty. IMPRESSION: 1. Post RIGHT upper lobectomy. 2. Stable small pulmonary nodules. 3. No new or progressive findings, no signs of metastatic disease at this time. Continued attention to small peri-fissural nodule in the RIGHT chest is suggested, finding remains stable since December of 2021. 4.  Aortic Atherosclerosis (ICD10-I70.0). Electronically Signed   By: Zetta Bills M.D.   On: 12/28/2020 17:47   CT Abdomen Pelvis W Contrast  Result Date: 12/28/2020 CLINICAL DATA:  Non-small cell lung cancer staging evaluation in a 70 year old male. EXAM: CT CHEST, ABDOMEN, AND PELVIS WITH CONTRAST TECHNIQUE: Multidetector CT imaging of the chest, abdomen and pelvis was performed following the standard protocol during bolus administration of intravenous contrast. CONTRAST:  35mL OMNIPAQUE IOHEXOL 350 MG/ML SOLN COMPARISON:  August 26, 2020. FINDINGS: CT CHEST FINDINGS Cardiovascular: Calcified noncalcified plaque in the thoracic aorta. Normal heart size without pericardial effusion. No signs of aortic or pulmonary arterial dilation. Mediastinum/Nodes: No enlarged mediastinal, hilar, or axillary lymph nodes. Thyroid gland, trachea, and esophagus demonstrate no significant findings. Lungs/Pleura: Post RIGHT upper lobectomy. Peri fissural nodule medially along the minor fissure (image 94/6) 13 mm greatest axial dimension. Small nodule along the periphery of the RIGHT lower lobe (image 71/6) 3-4 mm unchanged as well. Scattered small pulmonary nodules some with calcification in the RIGHT lower lobe with similar appearance. Nodular area at the LEFT lung base in the subpleural lung containing some calcification compatible with scarring unchanged as well. No effusion. No consolidative process. Musculoskeletal: See below  for full musculoskeletal details. Spinal degenerative changes about the thoracic spine. Post median sternotomy. Visualized scapulae and clavicles are intact. No destructive bone lesion about the bony thorax. CT ABDOMEN PELVIS FINDINGS Hepatobiliary: Stable hepatic cysts. No suspicious hepatic lesion. Portal vein is patent. No pericholecystic stranding or sign of biliary duct dilation. Pancreas: Normal, without mass, inflammation or ductal dilatation. Spleen: Spleen normal size and contour. Adrenals/Urinary Tract: Adrenal glands are normal. Symmetric renal enhancement without hydronephrosis. Urinary bladder with smooth contours. Limited assessment due to streak artifact from LEFT hip arthroplasty. LEFT renal cyst arising from the lower pole is unchanged. No suspicious renal lesion. Stomach/Bowel: No acute gastrointestinal process. Colonic diverticulosis. Vascular/Lymphatic: Scattered aortic atherosclerosis without aneurysm. There is no gastrohepatic or hepatoduodenal ligament lymphadenopathy. No retroperitoneal or mesenteric lymphadenopathy. No pelvic sidewall lymphadenopathy. Reproductive: Prostatomegaly as before, unchanged. Other: Small fat containing bilateral inguinal hernias. Musculoskeletal: Spinal degenerative changes. No acute or destructive  bone process. Post LEFT hip arthroplasty. IMPRESSION: 1. Post RIGHT upper lobectomy. 2. Stable small pulmonary nodules. 3. No new or progressive findings, no signs of metastatic disease at this time. Continued attention to small peri-fissural nodule in the RIGHT chest is suggested, finding remains stable since December of 2021. 4.  Aortic Atherosclerosis (ICD10-I70.0). Electronically Signed   By: Zetta Bills M.D.   On: 12/28/2020 17:47    Assessment & Plan:   Jimmey was seen today for annual exam.  Diagnoses and all orders for this visit:  Type II diabetes mellitus with manifestations ([place])- His A1C is up to 6.2%. Will start an SGLT-2 inh. -     Microalbumin  / creatinine urine ratio; Future -     Urinalysis, Routine w reflex microscopic; Future -     Hemoglobin A1c; Future -     Hemoglobin A1c -     Urinalysis, Routine w reflex microscopic -     Microalbumin / creatinine urine ratio -     empagliflozin (JARDIANCE) 10 MG TABS tablet; Take 1 tablet (10 mg total) by mouth daily before breakfast.  Stage 3a chronic kidney disease ([place])- His renal function is stable. -     Microalbumin / creatinine urine ratio; Future -     Urinalysis, Routine w reflex microscopic; Future -     Urinalysis, Routine w reflex microscopic -     Microalbumin / creatinine urine ratio  Routine general medical examination at a health care facility- Exam completed, labs reviewed, vaccines reviewed/updated, cancer screenings are UTD, pt ed material was give.  BPH with obstruction/lower urinary tract symptoms- His PSA is normal. -     PSA; Future -     PSA  Hyperlipidemia with target LDL less than 130- He will not take a statin. -     TSH; Future -     TSH  Thrombocytopenia ([place])- PLT ct remains slightly low. B12/folate are normal. This is benign. Will follow. -     Vitamin B12; Future -     Folate; Future -     Folate -     Vitamin B12  Flu vaccine need -     Flu Vaccine QUAD High Dose(Fluad)  Statin myopathy- He will not take a statin.  Chronic diastolic congestive heart failure (H[facility]) -     empagliflozin (JARDIANCE) 10 MG TABS tablet; Take 1 tablet (10 mg total) by mouth daily before breakfast.  I am having Jazier A. Loyd "[name]" start on empagliflozin and Shingrix. I am also having him maintain his Fish Oil, furosemide, sildenafil, multivitamin, metoprolol tartrate, Xarelto, ezetimibe, osimertinib mesylate, and metroNIDAZOLE.  Meds ordered this encounter  Medications   empagliflozin (JARDIANCE) 10 MG TABS tablet    Sig: Take 1 tablet (10 mg total) by mouth daily before breakfast.    Dispense:  90 tablet    Refill:  0   Zoster Vaccine Adjuvanted [facility])  injection    Sig: Inject 0.5 mLs into the muscle once for 1 dose.    Dispense:  0.5 mL    Refill:  1      Follow-up: Return in about 6 months (around 09/04/2021).  [name], MD

## 2021-03-10 ENCOUNTER — Other Ambulatory Visit (HOSPITAL_COMMUNITY): Payer: Self-pay

## 2021-03-11 MED ORDER — SHINGRIX 50 MCG/0.5ML IM SUSR: 0.5000 mL | Freq: Once | INTRAMUSCULAR | 1 refills | Status: AC

## 2021-03-11 MED ORDER — EMPAGLIFLOZIN 10 MG PO TABS
10.0000 mg | ORAL_TABLET | Freq: Every day | ORAL | 0 refills | Status: DC
Start: 2021-03-11 — End: 2022-03-18

## 2021-03-17 ENCOUNTER — Other Ambulatory Visit (HOSPITAL_COMMUNITY): Payer: Self-pay

## 2021-03-19 ENCOUNTER — Telehealth: Payer: Self-pay | Admitting: Internal Medicine

## 2021-03-19 NOTE — Telephone Encounter (Signed)
[facility] reached out to patient to schedule an appointment but pt declined. Pt stated he is already an established patient with [facility].

## 2021-03-19 NOTE — Telephone Encounter (Signed)
Referral updated to [facility]

## 2021-04-08 ENCOUNTER — Other Ambulatory Visit: Payer: Self-pay | Admitting: Physician Assistant

## 2021-04-08 ENCOUNTER — Other Ambulatory Visit (HOSPITAL_COMMUNITY): Payer: Self-pay

## 2021-04-08 DIAGNOSIS — C3491 Malignant neoplasm of unspecified part of right bronchus or lung: Secondary | ICD-10-CM

## 2021-04-08 MED ORDER — OSIMERTINIB MESYLATE 80 MG PO TABS
ORAL_TABLET | Freq: Every day | ORAL | 2 refills | Status: DC
  Filled 2021-04-08: qty 30, 30d supply, fill #0
  Filled 2021-05-06: qty 30, 30d supply, fill #1
  Filled 2021-06-05: qty 30, 30d supply, fill #2

## 2021-04-09 ENCOUNTER — Telehealth: Payer: Self-pay | Admitting: Internal Medicine

## 2021-04-09 NOTE — Telephone Encounter (Signed)
Forms have been faxed again.

## 2021-04-09 NOTE — Telephone Encounter (Signed)
James [name] from [place] calling in  Says she sent over form that needed to be completed & signed by provider in regards to patient cpap supplies  Says she only received cover sheet back & not form  Please send completed & signed form back to them as soon as possible (F) [phone]

## 2021-04-11 ENCOUNTER — Other Ambulatory Visit (HOSPITAL_COMMUNITY): Payer: Self-pay

## 2021-04-18 NOTE — Telephone Encounter (Signed)
James [name] called back stated she only received the cover sheet. Requesting it to be refaxed please.

## 2021-04-21 NOTE — Telephone Encounter (Signed)
Copy of forms have been sen via mail

## 2021-04-22 NOTE — Telephone Encounter (Signed)
James [name] calling back in  [place] her that forms have been sent via mail but she is requesting to have those forms sent via fax also  Callback # [phone] Fax [phone]

## 2021-04-22 NOTE — Telephone Encounter (Signed)
I have faxed the forms for a third and final time.

## 2021-04-25 ENCOUNTER — Other Ambulatory Visit: Payer: Self-pay | Admitting: Cardiology

## 2021-05-02 ENCOUNTER — Ambulatory Visit (HOSPITAL_COMMUNITY)
Admission: RE | Admit: 2021-05-02 | Discharge: 2021-05-02 | Disposition: A | Discharge status: Home / Self Care | Payer: Medicare PPO | Source: Ambulatory Visit | Attending: Internal Medicine | Admitting: Internal Medicine

## 2021-05-02 ENCOUNTER — Encounter (HOSPITAL_COMMUNITY): Payer: Self-pay

## 2021-05-02 ENCOUNTER — Inpatient Hospital Stay: Payer: Medicare PPO | Attending: Internal Medicine

## 2021-05-02 ENCOUNTER — Other Ambulatory Visit: Payer: Self-pay

## 2021-05-02 DIAGNOSIS — Z902 Acquired absence of lung [part of]: Secondary | ICD-10-CM | POA: Insufficient documentation

## 2021-05-02 DIAGNOSIS — C3490 Malignant neoplasm of unspecified part of unspecified bronchus or lung: Secondary | ICD-10-CM

## 2021-05-02 DIAGNOSIS — K7689 Other specified diseases of liver: Secondary | ICD-10-CM | POA: Diagnosis not present

## 2021-05-02 DIAGNOSIS — R911 Solitary pulmonary nodule: Secondary | ICD-10-CM | POA: Diagnosis not present

## 2021-05-02 DIAGNOSIS — R222 Localized swelling, mass and lump, trunk: Secondary | ICD-10-CM | POA: Insufficient documentation

## 2021-05-02 DIAGNOSIS — Z85118 Personal history of other malignant neoplasm of bronchus and lung: Secondary | ICD-10-CM | POA: Insufficient documentation

## 2021-05-02 DIAGNOSIS — J90 Pleural effusion, not elsewhere classified: Secondary | ICD-10-CM | POA: Diagnosis not present

## 2021-05-02 DIAGNOSIS — N281 Cyst of kidney, acquired: Secondary | ICD-10-CM | POA: Diagnosis not present

## 2021-05-02 LAB — CMP (CANCER CENTER ONLY)
ALT: 27 U/L (ref 0–44)
AST: 25 U/L (ref 15–41)
Albumin: 4.1 g/dL (ref 3.5–5.0)
Alkaline Phosphatase: 54 U/L (ref 38–126)
Anion gap: 6 (ref 5–15)
BUN: 19 mg/dL (ref 8–23)
CO2: 28 mmol/L (ref 22–32)
Calcium: 9.3 mg/dL (ref 8.9–10.3)
Chloride: 107 mmol/L (ref 98–111)
Creatinine: 1.41 mg/dL — ABNORMAL HIGH (ref 0.61–1.24)
GFR, Estimated: 53 mL/min — ABNORMAL LOW (ref >60)
Glucose, Bld: 111 mg/dL — ABNORMAL HIGH (ref 70–99)
Potassium: 4.4 mmol/L (ref 3.5–5.1)
Sodium: 141 mmol/L (ref 135–145)
Total Bilirubin: 0.5 mg/dL (ref 0.3–1.2)
Total Protein: 6.8 g/dL (ref 6.5–8.1)

## 2021-05-02 LAB — CBC WITH DIFFERENTIAL (CANCER CENTER ONLY)
Abs Immature Granulocytes: 0.03 10*3/uL (ref 0.00–0.07)
Basophils Absolute: 0 10*3/uL (ref 0.0–0.1)
Basophils Relative: 1 %
Eosinophils Absolute: 0.2 10*3/uL (ref 0.0–0.5)
Eosinophils Relative: 2 %
HCT: 46.2 % (ref 39.0–52.0)
Hemoglobin: 15.2 g/dL (ref 13.0–17.0)
Immature Granulocytes: 0 %
Lymphocytes Relative: 28 %
Lymphs Abs: 1.9 10*3/uL (ref 0.7–4.0)
MCH: 30.8 pg (ref 26.0–34.0)
MCHC: 32.9 g/dL (ref 30.0–36.0)
MCV: 93.7 fL (ref 80.0–100.0)
Monocytes Absolute: 0.7 10*3/uL (ref 0.1–1.0)
Monocytes Relative: 10 %
Neutro Abs: 4.1 10*3/uL (ref 1.7–7.7)
Neutrophils Relative %: 59 %
Platelet Count: 144 10*3/uL — ABNORMAL LOW (ref 150–400)
RBC: 4.93 MIL/uL (ref 4.22–5.81)
RDW: 14.2 % (ref 11.5–15.5)
WBC Count: 7 10*3/uL (ref 4.0–10.5)
nRBC: 0 % (ref 0.0–0.2)

## 2021-05-02 MED ORDER — SODIUM CHLORIDE (PF) 0.9 % IJ SOLN
INTRAMUSCULAR | Status: AC
  Filled 2021-05-02: qty 50

## 2021-05-02 MED ORDER — IOHEXOL 350 MG/ML SOLN
80.0000 mL | Freq: Once | INTRAVENOUS | Status: AC | PRN
  Administered 2021-05-02: 11:00:00 80 mL via INTRAVENOUS

## 2021-05-06 ENCOUNTER — Other Ambulatory Visit (HOSPITAL_COMMUNITY): Payer: Self-pay

## 2021-05-06 ENCOUNTER — Other Ambulatory Visit: Payer: Self-pay

## 2021-05-06 ENCOUNTER — Inpatient Hospital Stay: Payer: Medicare PPO | Admitting: Internal Medicine

## 2021-05-06 VITALS — BP 153/71 | HR 70 | Temp 97.9°F | Resp 17 | Wt 234.1 lb

## 2021-05-06 DIAGNOSIS — Z85118 Personal history of other malignant neoplasm of bronchus and lung: Secondary | ICD-10-CM | POA: Diagnosis not present

## 2021-05-06 DIAGNOSIS — R222 Localized swelling, mass and lump, trunk: Secondary | ICD-10-CM | POA: Diagnosis not present

## 2021-05-06 DIAGNOSIS — J90 Pleural effusion, not elsewhere classified: Secondary | ICD-10-CM | POA: Diagnosis not present

## 2021-05-06 DIAGNOSIS — C3491 Malignant neoplasm of unspecified part of right bronchus or lung: Secondary | ICD-10-CM | POA: Diagnosis not present

## 2021-05-06 DIAGNOSIS — Z902 Acquired absence of lung [part of]: Secondary | ICD-10-CM | POA: Diagnosis not present

## 2021-05-06 NOTE — Progress Notes (Signed)
[facility] [phone]   [phone]  OFFICE PROGRESS NOTE  James Lima, MD [place] [address]  DIAGNOSIS: Recurrent non-small cell lung cancer, adenocarcinoma initially diagnosed as stage IA (T1c, N0, M0) non-small cell lung cancer, adenocarcinoma presented with right upper lobe lung nodule in April 2018 with recurrence in June 2019.  Biomarker Findings Microsatellite status - MS-Stable Tumor Mutational Burden - TMB-Low (3 Muts/Mb) Genomic Findings For a complete list of the genes assayed, please refer to the Appendix. EGFR exon 19 deletion ([id number]>W) CDKN2A/B loss NKX2-1 amplification 7 Disease relevant genes with no reportable alterations: KRAS, ALK, BRAF, MET, RET, ERBB2, ROS1   PRIOR THERAPY: Status post right upper lobectomy with lymph node dissection under the care of Dr. [name] on 09/02/2016.  CURRENT THERAPY: Tagrisso 80 mg p.o. daily.  First dose started November 05, 2017.  Status post 42 months of treatment.  INTERVAL HISTORY: James [name] 70 y.o. male returns to the clinic today for follow-up visit accompanied by his wife.  The patient is feeling fine today with no concerning complaints except for occasional shortness of breath with exertion but no significant chest pain, cough or hemoptysis.  He denied having any fever or chills.  He has no nausea, vomiting, diarrhea or constipation.  He has no headache or visual changes.  He continues to complain of arthralgia.  He has no recent weight loss or night sweats.  He actually gained few pounds during the holiday.  He had repeat CT scan of the chest, abdomen pelvis performed recently and he is here for evaluation and discussion of his scan results.  MEDICAL HISTORY: Past Medical History:  Diagnosis Date   Adenocarcinoma of right lung, stage 1 ([place]) 09/06/2016   Anxiety    Arthritis    "knees, hips, back" (10/19/2012)   Chronic diastolic congestive heart  failure (HCC)    Chronic lower back pain    Colon polyps    10/27/2002, repeat letter 09/17/2007   Coronary artery disease    Coronary artery disease involving native coronary artery of native heart without angina pectoris    Depressive disorder, not elsewhere classified    no meds   Diabetes mellitus without complication ([place])    diet controlled- no med  (while in hosp 4/18 -elevated cbg   Dyspnea    Fasting hyperglycemia    GERD (gastroesophageal reflux disease)    Heart murmur    Hemoptysis    abnormal CT Chest 01/29/10 - ? new GG changes RUL > not viz on plain cxr 02/26/2010   Hypertension    Mitral regurgitation    severe MR 08/2016   MPN (myeloproliferative neoplasm) ([facility])    1st detected 06/04/1998   Obesity    OSA on CPAP    last sleep study 10 years ago   Other and unspecified hyperlipidemia    Peripheral vascular disease ([facility]) 08/2016   after lung surgey small clots in lungs,after hip dvt-5/16   Pneumonia    4/18   Positive PPD 1965   "non reactive in 2012" (10/19/2012)   Routine general medical examination at a health care facility    S/P CABG x 1 03/24/2017   [name] to LAD   S/P mitral valve repair 03/24/2017   Complex valvuloplasty including artificial Gore-tex neochord placement x6 and 28 mm Sorin Annuloflex posterior annuloplasty band   Special screening for malignant neoplasm of prostate    Spinal stenosis, unspecified region other  than cervical    Wrist pain, left     ALLERGIES:  is allergic to symbicort [budesonide-formoterol fumarate] and amoxicillin.  MEDICATIONS:  Current Outpatient Medications  Medication Sig Dispense Refill   empagliflozin (JARDIANCE) 10 MG TABS tablet Take 1 tablet (10 mg total) by mouth daily before breakfast. 90 tablet 0   ezetimibe (ZETIA) 10 MG tablet TAKE 1 TABLET BY MOUTH EVERY DAY 90 tablet 3   furosemide (LASIX) 20 MG tablet TAKE 0.5 TABLET (10 MG TOTAL) BY MOUTH DAILY 45 tablet 0   metoprolol tartrate (LOPRESSOR) 25 MG tablet  TAKE 0.5 TABLETS (12.5 MG TOTAL) BY MOUTH 2 (TWO) TIMES DAILY. 90 tablet 3   metroNIDAZOLE (METROCREAM) 0.75 % cream Apply 1 application topically in the morning and at bedtime.     Multiple Vitamin (MULTIVITAMIN) tablet Take 1 tablet by mouth daily.     Omega-3 Fatty Acids (FISH OIL) 500 MG CAPS Take 500 mg by mouth daily.      osimertinib mesylate (TAGRISSO) 80 MG tablet TAKE 1 TABLET (80 MG TOTAL) BY MOUTH DAILY. 30 tablet 2   sildenafil (REVATIO) 20 MG tablet Take 4 tablets (80 mg total) by mouth daily as needed. 60 tablet 5   XARELTO 20 MG TABS tablet TAKE 1 TABLET (20 MG TOTAL) BY MOUTH DAILY WITH SUPPER. 90 tablet 3   No current facility-administered medications for this visit.    SURGICAL HISTORY:  Past Surgical History:  Procedure Laterality Date   ANTERIOR CRUCIATE LIGAMENT REPAIR Left 1967   CARDIAC CATHETERIZATION  2000   CHEST TUBE INSERTION Right 10/19/2012   post bronch   COLONOSCOPY W/ POLYPECTOMY     CORONARY ARTERY BYPASS GRAFT N/A 03/24/2017   Procedure: CORONARY ARTERY BYPASS GRAFTING (CABG)x1 using left internal mammary artery, [name]-LAD;  Surgeon: Rexene Alberts, MD;  Location: [facility];  Service: Open Heart Surgery;  Laterality: N/A;   FLEXIBLE BRONCHOSCOPY  10/19/2012   Flexible video fiberoptic bronchoscopy with electromagnetic navigation and biopsies.   INTRAVASCULAR PRESSURE WIRE/FFR STUDY N/A 08/11/2016   Procedure: Intravascular Pressure Wire/FFR Study;  Surgeon: [name], MD;  Location: [facility];  Service: Cardiovascular;  Laterality: N/A;   IR ANGIOGRAM PULMONARY BILATERAL SELECTIVE  02/06/2018   IR ANGIOGRAM SELECTIVE EACH ADDITIONAL VESSEL  02/06/2018   IR ANGIOGRAM SELECTIVE EACH ADDITIONAL VESSEL  02/06/2018   IR INFUSION THROMBOL ARTERIAL INITIAL (MS)  02/06/2018   IR INFUSION THROMBOL ARTERIAL INITIAL (MS)  02/06/2018   IR THROMB F/U EVAL ART/VEN FINAL DAY (MS)  02/07/2018   IR US GUIDE VASC ACCESS RIGHT  02/06/2018   LOBECTOMY Right 09/02/2016    Procedure: RIGHT UPPER LOBECTOMY;  Surgeon: Melrose Nakayama, MD;  Location: [facility];  Service: Thoracic;  Laterality: Right;   LYMPH NODE DISSECTION Right 09/02/2016   Procedure: LYMPH NODE DISSECTION, RIGHT LUNG;  Surgeon: Melrose Nakayama, MD;  Location: [facility];  Service: Thoracic;  Laterality: Right;   MITRAL VALVE REPAIR N/A 03/24/2017   Procedure: MITRAL VALVE REPAIR (MVR) with Sorin [employer]omed[employer] Annuloflex size 28;  Surgeon: Rexene Alberts, MD;  Location: [facility];  Service: Open Heart Surgery;  Laterality: N/A;   RIGHT/LEFT HEART CATH AND CORONARY ANGIOGRAPHY N/A 08/11/2016   Procedure: Right/Left Heart Cath and Coronary Angiography;  Surgeon: [name], MD;  Location: [facility];  Service: Cardiovascular;  Laterality: N/A;   TEE WITHOUT CARDIOVERSION N/A 07/17/2016   Procedure: TRANSESOPHAGEAL ECHOCARDIOGRAM (TEE);  Surgeon: [name], MD;  Location: [facility]  ENDOSCOPY;  Service: Cardiovascular;  Laterality: N/A;   TEE WITHOUT CARDIOVERSION N/A 03/24/2017   Procedure: TRANSESOPHAGEAL ECHOCARDIOGRAM (TEE);  Surgeon: Rexene Alberts, MD;  Location: [facility];  Service: Open Heart Surgery;  Laterality: N/A;   TONSILLECTOMY  1950's   TOTAL HIP ARTHROPLASTY Left 10/05/2014   dr [name]   TOTAL HIP ARTHROPLASTY Left 10/05/2014   Procedure: LEFT TOTAL HIP ARTHROPLASTY ANTERIOR APPROACH;  Surgeon: [name], MD;  Location: [facility];  Service: Orthopedics;  Laterality: Left;   VIDEO ASSISTED THORACOSCOPY (VATS)/WEDGE RESECTION Right 09/02/2016   Procedure: VIDEO ASSISTED THORACOSCOPY (VATS)/RIGHT UPPER LOBE WEDGE RESECTION;  Surgeon: Melrose Nakayama, MD;  Location: [facility];  Service: Thoracic;  Laterality: Right;   VIDEO BRONCHOSCOPY WITH ENDOBRONCHIAL NAVIGATION N/A 10/19/2012   Procedure: VIDEO BRONCHOSCOPY WITH ENDOBRONCHIAL NAVIGATION;  Surgeon: [name], MD;  Location: [facility];  Service: Thoracic;  Laterality: N/A;   WRIST RECONSTRUCTION Left 12/2009   'proximal row  carpectomy" [name]    REVIEW OF SYSTEMS:  Constitutional: negative Eyes: negative Ears, nose, mouth, throat, and face: negative Respiratory: positive for dyspnea on exertion Cardiovascular: negative Gastrointestinal: negative Genitourinary:negative Integument/breast: negative Hematologic/lymphatic: negative Musculoskeletal:positive for arthralgias Neurological: negative Behavioral/Psych: negative Endocrine: negative Allergic/Immunologic: negative   PHYSICAL EXAMINATION: General appearance: alert, cooperative, and no distress Head: Normocephalic, without obvious abnormality, atraumatic Neck: no adenopathy, no JVD, supple, symmetrical, trachea midline, and thyroid not enlarged, symmetric, no tenderness/mass/nodules Lymph nodes: Cervical, supraclavicular, and axillary nodes normal. Resp: clear to auscultation bilaterally Back: symmetric, no curvature. ROM normal. No CVA tenderness. Cardio: regular rate and rhythm, S1, S2 normal, no murmur, click, rub or gallop GI: soft, non-tender; bowel sounds normal; no masses,  no organomegaly Extremities: extremities normal, atraumatic, no cyanosis or edema Neurologic: Alert and oriented X 3, normal strength and tone. Normal symmetric reflexes. Normal coordination and gait  ECOG PERFORMANCE STATUS: 1 - Symptomatic but completely ambulatory  Blood pressure (!) 153/71, pulse 70, temperature 97.9 F (36.6 C), temperature source Tympanic, resp. rate 17, weight 234 lb 1 oz (106.2 kg), SpO2 97 %.  LABORATORY DATA: Lab Results  Component Value Date   WBC 7.0 05/02/2021   HGB 15.2 05/02/2021   HCT 46.2 05/02/2021   MCV 93.7 05/02/2021   PLT 144 (L) 05/02/2021      Chemistry      Component Value Date/Time   NA 141 05/02/2021 0906   NA 142 10/01/2016 1534   K 4.4 05/02/2021 0906   K 4.2 10/01/2016 1534   CL 107 05/02/2021 0906   CO2 28 05/02/2021 0906   CO2 28 10/01/2016 1534   BUN 19 05/02/2021 0906   BUN 15.9 10/01/2016 1534    CREATININE 1.41 (H) 05/02/2021 0906   CREATININE 1.10 02/08/2017 1101   CREATININE 1.5 (H) 10/01/2016 1534      Component Value Date/Time   CALCIUM 9.3 05/02/2021 0906   CALCIUM 9.6 10/01/2016 1534   ALKPHOS 54 05/02/2021 0906   ALKPHOS 65 10/01/2016 1534   AST 25 05/02/2021 0906   AST 20 10/01/2016 1534   ALT 27 05/02/2021 0906   ALT 24 10/01/2016 1534   BILITOT 0.5 05/02/2021 0906   BILITOT 0.46 10/01/2016 1534       RADIOGRAPHIC STUDIES: CT Chest W Contrast  Result Date: 05/03/2021 CLINICAL DATA:  Follow-up recurrent non-small cell lung cancer EXAM: CT CHEST, ABDOMEN, AND PELVIS WITH CONTRAST TECHNIQUE: Multidetector CT imaging of the chest, abdomen and pelvis was performed following the standard protocol during bolus administration of intravenous  contrast. CONTRAST:  65m OMNIPAQUE IOHEXOL 350 MG/ML SOLN COMPARISON:  12/27/2020 FINDINGS: CT CHEST FINDINGS Cardiovascular: Heart is normal in size.  No pericardial effusion. No evidence of thoracic aortic aneurysm. Atherosclerotic calcifications of the aortic arch. Coronary atherosclerosis of the LAD. Mitral valve annular calcifications. Mediastinum/Nodes: No suspicious mediastinal lymphadenopathy. Visualized thyroid is unremarkable. Lungs/Pleura: Status post right upper lobectomy. 13 x 7 mm perifissural nodule in the medial right hemithorax (series 4/image 85), unchanged from the recent prior although slowly progressive from 2021. 3 mm nodule in the lateral right lower lobe (series 4/image 62), unchanged. 3 mm calcified granuloma in the posterior right lower lobe (series 4/image 33), benign. 3 mm calcified granuloma in the posterior left lower lobe (series 4/image 103), benign. Trace right pleural effusion.  No pneumothorax. Musculoskeletal: Degenerative changes of the thoracic spine. Median sternotomy. CT ABDOMEN PELVIS FINDINGS Hepatobiliary: Liver is notable for 5.1 cm lobulated cyst in the posterior right hepatic lobe (series 2/image 51).  Additional 2.2 cm cyst in segment 4. No suspicious/enhancing hepatic lesions. Gallbladder is unremarkable. No intrahepatic or extrahepatic duct dilatation. Pancreas: Within normal limits. Spleen: Within normal limits. Adrenals/Urinary Tract: Adrenal glands are within normal limits. Kidneys are within normal limits, noting a left renal sinus cysts and a 3.3 cm left lower pole renal cyst. No hydronephrosis. Bladder is mildly thick-walled although underdistended. Stomach/Bowel: Stomach is within normal limits. No evidence of bowel obstruction. Appendix is not discretely visualized. No colonic wall thickening or inflammatory changes. Vascular/Lymphatic: No evidence of abdominal aortic aneurysm. Atherosclerotic calcifications of the abdominal aorta and branch vessels. No suspicious abdominopelvic lymphadenopathy. Reproductive: Prostate is unremarkable. Other: No abdominopelvic ascites. Musculoskeletal: Degenerative changes of the lumbar spine. Left hip arthroplasty, without evidence of complication. IMPRESSION: Status post right upper lobectomy. Right perifissural nodule in the medial right hemithorax is unchanged from recent CT but slowly progressive from 2021, raising concern for recurrence. No evidence of metastatic disease in the abdomen/pelvis. Electronically Signed   By: SJulian HyM.D.   On: 05/03/2021 08:40   CT Abdomen Pelvis W Contrast  Result Date: 05/03/2021 CLINICAL DATA:  Follow-up recurrent non-small cell lung cancer EXAM: CT CHEST, ABDOMEN, AND PELVIS WITH CONTRAST TECHNIQUE: Multidetector CT imaging of the chest, abdomen and pelvis was performed following the standard protocol during bolus administration of intravenous contrast. CONTRAST:  868mOMNIPAQUE IOHEXOL 350 MG/ML SOLN COMPARISON:  12/27/2020 FINDINGS: CT CHEST FINDINGS Cardiovascular: Heart is normal in size.  No pericardial effusion. No evidence of thoracic aortic aneurysm. Atherosclerotic calcifications of the aortic arch. Coronary  atherosclerosis of the LAD. Mitral valve annular calcifications. Mediastinum/Nodes: No suspicious mediastinal lymphadenopathy. Visualized thyroid is unremarkable. Lungs/Pleura: Status post right upper lobectomy. 13 x 7 mm perifissural nodule in the medial right hemithorax (series 4/image 85), unchanged from the recent prior although slowly progressive from 2021. 3 mm nodule in the lateral right lower lobe (series 4/image 62), unchanged. 3 mm calcified granuloma in the posterior right lower lobe (series 4/image 33), benign. 3 mm calcified granuloma in the posterior left lower lobe (series 4/image 103), benign. Trace right pleural effusion.  No pneumothorax. Musculoskeletal: Degenerative changes of the thoracic spine. Median sternotomy. CT ABDOMEN PELVIS FINDINGS Hepatobiliary: Liver is notable for 5.1 cm lobulated cyst in the posterior right hepatic lobe (series 2/image 51). Additional 2.2 cm cyst in segment 4. No suspicious/enhancing hepatic lesions. Gallbladder is unremarkable. No intrahepatic or extrahepatic duct dilatation. Pancreas: Within normal limits. Spleen: Within normal limits. Adrenals/Urinary Tract: Adrenal glands are within normal limits. Kidneys are within normal  limits, noting a left renal sinus cysts and a 3.3 cm left lower pole renal cyst. No hydronephrosis. Bladder is mildly thick-walled although underdistended. Stomach/Bowel: Stomach is within normal limits. No evidence of bowel obstruction. Appendix is not discretely visualized. No colonic wall thickening or inflammatory changes. Vascular/Lymphatic: No evidence of abdominal aortic aneurysm. Atherosclerotic calcifications of the abdominal aorta and branch vessels. No suspicious abdominopelvic lymphadenopathy. Reproductive: Prostate is unremarkable. Other: No abdominopelvic ascites. Musculoskeletal: Degenerative changes of the lumbar spine. Left hip arthroplasty, without evidence of complication. IMPRESSION: Status post right upper lobectomy. Right  perifissural nodule in the medial right hemithorax is unchanged from recent CT but slowly progressive from 2021, raising concern for recurrence. No evidence of metastatic disease in the abdomen/pelvis. Electronically Signed   By: Julian Hy M.D.   On: 05/03/2021 08:40     ASSESSMENT AND PLAN: This is a very pleasant 70 years old white male with a stage Ia non-small cell lung cancer status post right upper lobectomy with lymph node dissection on September 02, 2016. The patient he was on observation since that time. Unfortunately there are some concerning findings with nodular density in the posterior right lower lobe as well as increase and right pleural effusion suspicious for disease recurrence. He underwent ultrasound-guided right thoracentesis and the cytology of the pleural fluid was consistent with recurrent adenocarcinoma. He had molecular studies performed by [facility] 1 that showed positive EGFR mutation with deletion 49. The patient was started on treatment with Tagrisso 80 mg p.o. daily on 11/05/2017.  He status post 42 months of treatment. The patient continues to tolerate his treatment fairly well with no concerning adverse effects. He had repeat CT scan of the chest, abdomen pelvis performed recently.  I personally and independently reviewed the scans and discussed the results with the patient and his wife. His scan showed no concerning findings for disease recurrence or metastasis but there was persistent perifissural nodule in the medial right hemithorax on the changes but has been slowly progressive from 2021. I recommended for the patient to continue his current treatment with Tagrisso with the same dose. I will see him back for follow-up visit in 2 months for evaluation and repeat blood work. He was advised to call immediately if he has any other concerning symptoms in the interval. The patient voices understanding of current disease status and treatment options and is in  agreement with the current care plan. All questions were answered. The patient knows to call the clinic with any problems, questions or concerns. We can certainly see the patient much sooner if necessary.  Disclaimer: This note was dictated with voice recognition software. Similar sounding words can inadvertently be transcribed and may not be corrected upon review.

## 2021-05-15 ENCOUNTER — Other Ambulatory Visit (HOSPITAL_COMMUNITY): Payer: Self-pay

## 2021-06-05 ENCOUNTER — Other Ambulatory Visit (HOSPITAL_COMMUNITY): Payer: Self-pay

## 2021-06-10 ENCOUNTER — Other Ambulatory Visit (HOSPITAL_COMMUNITY): Payer: Self-pay

## 2021-07-07 ENCOUNTER — Other Ambulatory Visit: Payer: Self-pay

## 2021-07-07 ENCOUNTER — Inpatient Hospital Stay: Payer: Medicare PPO | Admitting: Internal Medicine

## 2021-07-07 ENCOUNTER — Other Ambulatory Visit (HOSPITAL_COMMUNITY): Payer: Self-pay

## 2021-07-07 ENCOUNTER — Inpatient Hospital Stay: Payer: Medicare PPO | Attending: Internal Medicine

## 2021-07-07 ENCOUNTER — Other Ambulatory Visit: Payer: Self-pay | Admitting: Physician Assistant

## 2021-07-07 VITALS — BP 128/59 | HR 62 | Temp 97.6°F | Resp 18 | Ht 69.0 in | Wt 233.0 lb

## 2021-07-07 DIAGNOSIS — C3491 Malignant neoplasm of unspecified part of right bronchus or lung: Secondary | ICD-10-CM

## 2021-07-07 DIAGNOSIS — Z79899 Other long term (current) drug therapy: Secondary | ICD-10-CM | POA: Insufficient documentation

## 2021-07-07 DIAGNOSIS — C3490 Malignant neoplasm of unspecified part of unspecified bronchus or lung: Secondary | ICD-10-CM

## 2021-07-07 DIAGNOSIS — Z85118 Personal history of other malignant neoplasm of bronchus and lung: Secondary | ICD-10-CM | POA: Insufficient documentation

## 2021-07-07 DIAGNOSIS — Z5111 Encounter for antineoplastic chemotherapy: Secondary | ICD-10-CM

## 2021-07-07 LAB — CBC WITH DIFFERENTIAL (CANCER CENTER ONLY)
Abs Immature Granulocytes: 0.01 10*3/uL (ref 0.00–0.07)
Basophils Absolute: 0 10*3/uL (ref 0.0–0.1)
Basophils Relative: 1 %
Eosinophils Absolute: 0.1 10*3/uL (ref 0.0–0.5)
Eosinophils Relative: 2 %
HCT: 44.5 % (ref 39.0–52.0)
Hemoglobin: 14.6 g/dL (ref 13.0–17.0)
Immature Granulocytes: 0 %
Lymphocytes Relative: 30 %
Lymphs Abs: 1.7 10*3/uL (ref 0.7–4.0)
MCH: 30.7 pg (ref 26.0–34.0)
MCHC: 32.8 g/dL (ref 30.0–36.0)
MCV: 93.5 fL (ref 80.0–100.0)
Monocytes Absolute: 0.6 10*3/uL (ref 0.1–1.0)
Monocytes Relative: 11 %
Neutro Abs: 3.3 10*3/uL (ref 1.7–7.7)
Neutrophils Relative %: 56 %
Platelet Count: 139 10*3/uL — ABNORMAL LOW (ref 150–400)
RBC: 4.76 MIL/uL (ref 4.22–5.81)
RDW: 14.6 % (ref 11.5–15.5)
WBC Count: 5.8 10*3/uL (ref 4.0–10.5)
nRBC: 0 % (ref 0.0–0.2)

## 2021-07-07 LAB — CMP (CANCER CENTER ONLY)
ALT: 37 U/L (ref 0–44)
AST: 28 U/L (ref 15–41)
Albumin: 4.1 g/dL (ref 3.5–5.0)
Alkaline Phosphatase: 48 U/L (ref 38–126)
Anion gap: 4 — ABNORMAL LOW (ref 5–15)
BUN: 22 mg/dL (ref 8–23)
CO2: 25 mmol/L (ref 22–32)
Calcium: 9.1 mg/dL (ref 8.9–10.3)
Chloride: 111 mmol/L (ref 98–111)
Creatinine: 1.29 mg/dL — ABNORMAL HIGH (ref 0.61–1.24)
GFR, Estimated: 59 mL/min — ABNORMAL LOW (ref >60)
Glucose, Bld: 114 mg/dL — ABNORMAL HIGH (ref 70–99)
Potassium: 4.1 mmol/L (ref 3.5–5.1)
Sodium: 140 mmol/L (ref 135–145)
Total Bilirubin: 0.6 mg/dL (ref 0.3–1.2)
Total Protein: 6.7 g/dL (ref 6.5–8.1)

## 2021-07-07 MED ORDER — OSIMERTINIB MESYLATE 80 MG PO TABS
ORAL_TABLET | Freq: Every day | ORAL | 2 refills | Status: DC
  Filled 2021-07-07: qty 30, 30d supply, fill #0
  Filled 2021-08-01: qty 30, 30d supply, fill #1
  Filled 2021-09-02: qty 30, 30d supply, fill #2

## 2021-07-07 NOTE — Progress Notes (Signed)
[place] [phone]   [phone]  OFFICE PROGRESS NOTE  [name], MD [place] [address]  DIAGNOSIS: Recurrent non-small cell lung cancer, adenocarcinoma initially diagnosed as stage IA (T1c, N0, M0) non-small cell lung cancer, adenocarcinoma presented with right upper lobe lung nodule in April 2018 with recurrence in June 2019.  Biomarker Findings Microsatellite status - MS-Stable Tumor Mutational Burden - TMB-Low (3 Muts/Mb) Genomic Findings For a complete list of the genes assayed, please refer to the Appendix. EGFR exon 19 deletion ([id number]>S) CDKN2A/B loss NKX2-1 amplification 7 Disease relevant genes with no reportable alterations: KRAS, ALK, BRAF, MET, RET, ERBB2, ROS1   PRIOR THERAPY: Status post right upper lobectomy with lymph node dissection under the care of Dr. [name] on 09/02/2016.  CURRENT THERAPY: Tagrisso 80 mg p.o. daily.  First dose started November 05, 2017.  Status post 42 months of treatment.  INTERVAL HISTORY: James [name] 71 y.o. male returns to the clinic today for follow-up visit accompanied by his wife.  The patient is feeling fine today with no concerning complaints except for occasional shortness of breath with exertion but no significant chest pain, cough or hemoptysis.  He denied having any fever or chills.  He has no nausea, vomiting, diarrhea or constipation.  He has no headache or visual changes.  He continues to complain of arthralgia.  He has no recent weight loss or night sweats.  He actually gained few pounds during the holiday.  He had repeat CT scan of the chest, abdomen pelvis performed recently and he is here for evaluation and discussion of his scan results.  MEDICAL HISTORY: Past Medical History:  Diagnosis Date   Adenocarcinoma of right lung, stage 1 ([place]) 09/06/2016   Anxiety    Arthritis    "knees, hips, back" (10/19/2012)   Chronic diastolic congestive heart  failure (HCC)    Chronic lower back pain    Colon polyps    10/27/2002, repeat letter 09/17/2007   Coronary artery disease    Coronary artery disease involving native coronary artery of native heart without angina pectoris    Depressive disorder, not elsewhere classified    no meds   Diabetes mellitus without complication ([place])    diet controlled- no med  (while in hosp 4/18 -elevated cbg   Dyspnea    Fasting hyperglycemia    GERD (gastroesophageal reflux disease)    Heart murmur    Hemoptysis    abnormal CT Chest 01/29/10 - ? new GG changes RUL > not viz on plain cxr 02/26/2010   Hypertension    Mitral regurgitation    severe MR 08/2016   MPN (myeloproliferative neoplasm) ([place])    1st detected 06/04/1998   Obesity    OSA on CPAP    last sleep study 10 years ago   Other and unspecified hyperlipidemia    Peripheral vascular disease ([place]) 08/2016   after lung surgey small clots in lungs,after hip dvt-5/16   Pneumonia    4/18   Positive PPD 1965   "non reactive in 2012" (10/19/2012)   Routine general medical examination at a health care facility    S/P CABG x 1 03/24/2017   LIMA to LAD   S/P mitral valve repair 03/24/2017   Complex valvuloplasty including artificial Gore-tex neochord placement x6 and 28 mm Sorin Annuloflex posterior annuloplasty band   Special screening for malignant neoplasm of prostate    Spinal stenosis, unspecified region other  than cervical    Wrist pain, left     ALLERGIES:  is allergic to symbicort [budesonide-formoterol fumarate] and amoxicillin.  MEDICATIONS:  Current Outpatient Medications  Medication Sig Dispense Refill   empagliflozin (JARDIANCE) 10 MG TABS tablet Take 1 tablet (10 mg total) by mouth daily before breakfast. 90 tablet 0   ezetimibe (ZETIA) 10 MG tablet TAKE 1 TABLET BY MOUTH EVERY DAY 90 tablet 3   furosemide (LASIX) 20 MG tablet TAKE 0.5 TABLET (10 MG TOTAL) BY MOUTH DAILY 45 tablet 0   metoprolol tartrate (LOPRESSOR) 25 MG tablet  TAKE 0.5 TABLETS (12.5 MG TOTAL) BY MOUTH 2 (TWO) TIMES DAILY. 90 tablet 3   metroNIDAZOLE (METROCREAM) 0.75 % cream Apply 1 application topically in the morning and at bedtime.     Multiple Vitamin (MULTIVITAMIN) tablet Take 1 tablet by mouth daily.     Omega-3 Fatty Acids (FISH OIL) 500 MG CAPS Take 500 mg by mouth daily.      osimertinib mesylate (TAGRISSO) 80 MG tablet TAKE 1 TABLET (80 MG TOTAL) BY MOUTH DAILY. 30 tablet 2   sildenafil (REVATIO) 20 MG tablet Take 4 tablets (80 mg total) by mouth daily as needed. 60 tablet 5   XARELTO 20 MG TABS tablet TAKE 1 TABLET (20 MG TOTAL) BY MOUTH DAILY WITH SUPPER. 90 tablet 3   No current facility-administered medications for this visit.    SURGICAL HISTORY:  Past Surgical History:  Procedure Laterality Date   ANTERIOR CRUCIATE LIGAMENT REPAIR Left 1967   CARDIAC CATHETERIZATION  2000   CHEST TUBE INSERTION Right 10/19/2012   post bronch   COLONOSCOPY W/ POLYPECTOMY     CORONARY ARTERY BYPASS GRAFT N/A 03/24/2017   Procedure: CORONARY ARTERY BYPASS GRAFTING (CABG)x1 using left internal mammary artery, LIMA-LAD;  Surgeon: Rexene Alberts, MD;  Location: [facility];  Service: Open Heart Surgery;  Laterality: N/A;   FLEXIBLE BRONCHOSCOPY  10/19/2012   Flexible video fiberoptic bronchoscopy with electromagnetic navigation and biopsies.   INTRAVASCULAR PRESSURE WIRE/FFR STUDY N/A 08/11/2016   Procedure: Intravascular Pressure Wire/FFR Study;  Surgeon: [name], MD;  Location: [facility];  Service: Cardiovascular;  Laterality: N/A;   IR ANGIOGRAM PULMONARY BILATERAL SELECTIVE  02/06/2018   IR ANGIOGRAM SELECTIVE EACH ADDITIONAL VESSEL  02/06/2018   IR ANGIOGRAM SELECTIVE EACH ADDITIONAL VESSEL  02/06/2018   IR INFUSION THROMBOL ARTERIAL INITIAL (MS)  02/06/2018   IR INFUSION THROMBOL ARTERIAL INITIAL (MS)  02/06/2018   IR THROMB F/U EVAL ART/VEN FINAL DAY (MS)  02/07/2018   IR US GUIDE VASC ACCESS RIGHT  02/06/2018   LOBECTOMY Right 09/02/2016    Procedure: RIGHT UPPER LOBECTOMY;  Surgeon: Melrose Nakayama, MD;  Location: [facility];  Service: Thoracic;  Laterality: Right;   LYMPH NODE DISSECTION Right 09/02/2016   Procedure: LYMPH NODE DISSECTION, RIGHT LUNG;  Surgeon: Melrose Nakayama, MD;  Location: [facility];  Service: Thoracic;  Laterality: Right;   MITRAL VALVE REPAIR N/A 03/24/2017   Procedure: MITRAL VALVE REPAIR (MVR) with Sorin [employer]omedics Annuloflex size 28;  Surgeon: Rexene Alberts, MD;  Location: [facility];  Service: Open Heart Surgery;  Laterality: N/A;   RIGHT/LEFT HEART CATH AND CORONARY ANGIOGRAPHY N/A 08/11/2016   Procedure: Right/Left Heart Cath and Coronary Angiography;  Surgeon: [name], MD;  Location: [facility];  Service: Cardiovascular;  Laterality: N/A;   TEE WITHOUT CARDIOVERSION N/A 07/17/2016   Procedure: TRANSESOPHAGEAL ECHOCARDIOGRAM (TEE);  Surgeon: [name], MD;  Location[facility]  ENDOSCOPY;  Service: Cardiovascular;  Laterality: N/A;   TEE WITHOUT CARDIOVERSION N/A 03/24/2017   Procedure: TRANSESOPHAGEAL ECHOCARDIOGRAM (TEE);  Surgeon: Rexene Alberts, MD;  Location: [place];  Service: Open Heart Surgery;  Laterality: N/A;   TONSILLECTOMY  1950's   TOTAL HIP ARTHROPLASTY Left 10/05/2014   dr [name]   TOTAL HIP ARTHROPLASTY Left 10/05/2014   Procedure: LEFT TOTAL HIP ARTHROPLASTY ANTERIOR APPROACH;  Surgeon: [name], MD;  Location: [facility];  Service: Orthopedics;  Laterality: Left;   VIDEO ASSISTED THORACOSCOPY (VATS)/WEDGE RESECTION Right 09/02/2016   Procedure: VIDEO ASSISTED THORACOSCOPY (VATS)/RIGHT UPPER LOBE WEDGE RESECTION;  Surgeon: Melrose Nakayama, MD;  Location: [facility];  Service: Thoracic;  Laterality: Right;   VIDEO BRONCHOSCOPY WITH ENDOBRONCHIAL NAVIGATION N/A 10/19/2012   Procedure: VIDEO BRONCHOSCOPY WITH ENDOBRONCHIAL NAVIGATION;  Surgeon: [name], MD;  Location: [facility] [place];  Service: Thoracic;  Laterality: N/A;   WRIST RECONSTRUCTION Left 12/2009   'proximal row  carpectomy" [name]    REVIEW OF SYSTEMS:  Constitutional: negative Eyes: negative Ears, nose, mouth, throat, and face: negative Respiratory: positive for dyspnea on exertion Cardiovascular: negative Gastrointestinal: negative Genitourinary:negative Integument/breast: negative Hematologic/lymphatic: negative Musculoskeletal:positive for arthralgias Neurological: negative Behavioral/Psych: negative Endocrine: negative Allergic/Immunologic: negative   PHYSICAL EXAMINATION: General appearance: alert, cooperative, and no distress Head: Normocephalic, without obvious abnormality, atraumatic Neck: no adenopathy, no JVD, supple, symmetrical, trachea midline, and thyroid not enlarged, symmetric, no tenderness/mass/nodules Lymph nodes: Cervical, supraclavicular, and axillary nodes normal. Resp: clear to auscultation bilaterally Back: symmetric, no curvature. ROM normal. No CVA tenderness. Cardio: regular rate and rhythm, S1, S2 normal, no murmur, click, rub or gallop GI: soft, non-tender; bowel sounds normal; no masses,  no organomegaly Extremities: extremities normal, atraumatic, no cyanosis or edema Neurologic: Alert and oriented X 3, normal strength and tone. Normal symmetric reflexes. Normal coordination and gait  ECOG PERFORMANCE STATUS: 1 - Symptomatic but completely ambulatory  Blood pressure (!) 128/59, pulse 62, temperature 97.6 F (36.4 C), temperature source Tympanic, resp. rate 18, height 5' 9"  (1.753 m), weight 233 lb (105.7 kg), SpO2 98 %.  LABORATORY DATA: Lab Results  Component Value Date   WBC 5.8 07/07/2021   HGB 14.6 07/07/2021   HCT 44.5 07/07/2021   MCV 93.5 07/07/2021   PLT 139 (L) 07/07/2021      Chemistry      Component Value Date/Time   NA 140 07/07/2021 1042   NA 142 10/01/2016 1534   K 4.1 07/07/2021 1042   K 4.2 10/01/2016 1534   CL 111 07/07/2021 1042   CO2 25 07/07/2021 1042   CO2 28 10/01/2016 1534   BUN 22 07/07/2021 1042   BUN 15.9  10/01/2016 1534   CREATININE 1.29 (H) 07/07/2021 1042   CREATININE 1.10 02/08/2017 1101   CREATININE 1.5 (H) 10/01/2016 1534      Component Value Date/Time   CALCIUM 9.1 07/07/2021 1042   CALCIUM 9.6 10/01/2016 1534   ALKPHOS 48 07/07/2021 1042   ALKPHOS 65 10/01/2016 1534   AST 28 07/07/2021 1042   AST 20 10/01/2016 1534   ALT 37 07/07/2021 1042   ALT 24 10/01/2016 1534   BILITOT 0.6 07/07/2021 1042   BILITOT 0.46 10/01/2016 1534       RADIOGRAPHIC STUDIES: No results found.   ASSESSMENT AND PLAN: This is a very pleasant 71  years old white male with a stage Ia non-small cell lung cancer status post right upper lobectomy with lymph node dissection on September 02, 2016. The patient he  was on observation since that time. Unfortunately there are some concerning findings with nodular density in the posterior right lower lobe as well as increase and right pleural effusion suspicious for disease recurrence. He underwent ultrasound-guided right thoracentesis and the cytology of the pleural fluid was consistent with recurrent adenocarcinoma. He had molecular studies performed by [facility] 1 that showed positive EGFR mutation with deletion 87. The patient was started on treatment with Tagrisso 80 mg p.o. daily on 11/05/2017.  He status post 44 months of treatment.  The patient voices understanding of current disease status and treatment options and is in agreement with the current care plan. All questions were answered. The patient knows to call the clinic with any problems, questions or concerns. We can certainly see the patient much sooner if necessary.  Disclaimer: This note was dictated with voice recognition software. Similar sounding words can inadvertently be transcribed and may not be corrected upon review.

## 2021-07-09 ENCOUNTER — Other Ambulatory Visit (HOSPITAL_COMMUNITY): Payer: Self-pay

## 2021-07-09 ENCOUNTER — Encounter: Payer: Self-pay | Admitting: *Deleted

## 2021-07-09 DIAGNOSIS — E119 Type 2 diabetes mellitus without complications: Secondary | ICD-10-CM | POA: Diagnosis not present

## 2021-07-09 DIAGNOSIS — H43812 Vitreous degeneration, left eye: Secondary | ICD-10-CM | POA: Diagnosis not present

## 2021-07-09 DIAGNOSIS — H2513 Age-related nuclear cataract, bilateral: Secondary | ICD-10-CM | POA: Diagnosis not present

## 2021-07-09 LAB — HM DIABETES EYE EXAM

## 2021-07-09 NOTE — Progress Notes (Signed)
Oncology Nurse Navigator Documentation ? ?Oncology Nurse Navigator Flowsheets 07/09/2021 04/18/2019 11/07/2018 11/26/2017 11/05/2017 11/03/2017 10/28/2017  ?Abnormal Finding Date - - - 10/01/2017 - - -  ?Confirmed Diagnosis Date - - - 11/12/2017 - - -  ?Phase of Treatment - - Biologics - - - -  ?Expected Surgery Date - - - - - - -  ?Navigator Follow Up Date: 07/25/2021 - 11/07/2018 - - - -  ?Navigator Follow Up Reason: Scan Review - Follow-up Appointment - - - -  ?Post Navigation: Continue to Follow Patient? - - Yes - - - -  ?Navigator Location [facility]  ?Referral Date to RadOnc/MedOnc - - - - - - -  ?Navigator Encounter Type Appt/Treatment Plan Review Clinic/[facility] Follow-up Appt Clinic/[facility] Telephone Clinic/[facility] Telephone  ?Telephone - - - - Outgoing Call - [place] Call  ?Multidisiplinary Clinic Date - - - 10/28/2017 - - -  ?Multidisiplinary Clinic Type - - - Thoracic - - -  ?Treatment Initiated Date - - - 11/05/2017 - - -  ?Patient Visit Type Other MedOnc Follow-up MedOnc - MedOnc -  ?Treatment Phase Treatment Treatment Treatment Treatment Pre-Tx/Tx Discussion Pre-Tx/Tx Discussion Pre-Tx/Tx Discussion  ?Barriers/Navigation Needs Coordination of Care/I followed up on Mr. Markwood schedule for his mri and follow up with Dr. Julien Nordmann. He is set up at this this time.  Education Water quality scientist;Coordination of Care Education;Coordination of Care Coordination of Care  ?Education - Other Other Other Other Other -  ?Interventions Coordination of Care Education Education Education Coordination of Care;Education Coordination of Care;Education Coordination of Care  ?Acuity Level 2-Minimal Needs (1-2 Barriers Identified) Level 2-Minimal Needs (1-2 Barriers Identified) Level 1 Level 1 Level 2 Level 2 Level 2  ?Coordination of Care Other - - - Other Other Appts  ?Education Method - Verbal Verbal Verbal Verbal Verbal;Written  -  ?Support Groups/Services - - - - - - -  ?Time Spent with Patient 15 15 15 15 30  60 30  ?  ?

## 2021-07-14 ENCOUNTER — Telehealth: Payer: Self-pay | Admitting: Internal Medicine

## 2021-07-14 NOTE — Telephone Encounter (Signed)
Scheduled per 02/27 los, patient has been called and notified.  ?

## 2021-07-17 NOTE — Progress Notes (Unsigned)
Cardiology Office Note    Date:  07/17/2021   ID:  MCDANIEL OHMS, DOB 05-18-1950, MRN [id number]  PCP:  [name], MD  Cardiologist:  [name]   No chief complaint on file.   History of Present Illness:  James [name] is a 71 y.o. male with PMH of DM II, HLD, and severe MR. he was originally noted to have a loud murmur by his primary care physician.  Initial echocardiogram suggested mild MR, however it also suggested partially flail leaflet.  MR was thought to be underestimated.  TEE showed a flail P3 segment with ruptured cord and a severe MR.  Cardiac catheterization performed on 08/11/2016 showed 65% mid to distal LAD lesion, FFR borderline at 0.81, EF 65%.  Normal cardiac output and RV/LV filling pressure.  He had a PET scan on 08/17/2016 that showed a hypermetabolic solid 2.6 cm anterior right upper lobe pulmonary nodule compatible with prior bronchogenic, no hypermetabolic thoracic lymphadenopathy or distal metastatic disease.  There were nonspecific and mild asymmetric hypermetabolism in the left pontine tonsil and the left tongue base region.  CT surgery recommended lobectomy for his lung mass with possible CABG with LIMA to LAD and MV repair.  He underwent right upper lobe lobectomy in April.  Pathology positive for adenocarcinoma stage Ia.  Serial CT scan was recommended by oncology.  Post procedure, he became very short of breath.  CTA was positive for a small PE, he was started on Xarelto.  Repeat echocardiogram obtained on 02/15/2017 showed EF 60-65%, persistent MR.  He eventually underwent mitral valve repair with Sorin Carbomedics Annuloglex size 28, LIMA to LAD by Dr. Roxy Manns. He was DC on coumadin instead of Xarelto. Completed 6 months of anticoagulation for provoked PE and then it was stopped.   When last seen in April he was progressing well. More recently in August CT showed a right pleural effusion. Thoracentesis was positive for recurrent CA. He was started on Tagrisso by Dr.  Inda Merlin. He was admitted on February 06, 2018 with submassive PE. He was treated with  EKOS with bilateral intra-arterial TPA, patient completed 6 days of IV heparin. The original plan was to transition to Xarelto after 5 days of IV heparin.  However he developed left flank pain, hematuria and right leg painful swelling.  Hence IV heparin was continued.  Both Urology and OIrthopedics evaluated and cleared for transitioning to [place].  Xarelto was started on 02/12/2018.  Hematuria decreased  Right leg pain also improved. MRI did show evidence of hematoma that was improving. He had chronic DVT in right peroneal vein.  Since patient was started on Xarelto, aspirin discontinued to minimize bleeding risk. He had follow up CT on April 4 showing no evidence of progression of CA.   On follow up today he is doing well. No chest pain or increased SOB. No edema. Taking lasix 10 mg daily. Seeing Dr Posey Pronto now for CKD. On low salt diet. Denies any new complaints.   Past Medical History:  Diagnosis Date   Adenocarcinoma of right lung, stage 1 ([place]) 09/06/2016   Anxiety    Arthritis    "knees, hips, back" (10/19/2012)   Chronic diastolic congestive heart failure (HCC)    Chronic lower back pain    Colon polyps    10/27/2002, repeat letter 09/17/2007   Coronary artery disease    Coronary artery disease involving native coronary artery of native heart without angina pectoris    Depressive disorder, not elsewhere classified  no meds   Diabetes mellitus without complication (HCC)    diet controlled- no med  (while in hosp 4/18 -elevated cbg   Dyspnea    Fasting hyperglycemia    GERD (gastroesophageal reflux disease)    Heart murmur    Hemoptysis    abnormal CT Chest 01/29/10 - ? new GG changes RUL > not viz on plain cxr 02/26/2010   Hypertension    Mitral regurgitation    severe MR 08/2016   MPN (myeloproliferative neoplasm) ([place])    1st detected 06/04/1998   Obesity    OSA on CPAP    last sleep study 10  years ago   Other and unspecified hyperlipidemia    Peripheral vascular disease ([place]) 08/2016   after lung surgey small clots in lungs,after hip dvt-5/16   Pneumonia    4/18   Positive PPD 1965   "non reactive in 2012" (10/19/2012)   Routine general medical examination at a health care facility    S/P CABG x 1 03/24/2017   LIMA to LAD   S/P mitral valve repair 03/24/2017   Complex valvuloplasty including artificial Gore-tex neochord placement x6 and 28 mm Sorin Annuloflex posterior annuloplasty band   Special screening for malignant neoplasm of prostate    Spinal stenosis, unspecified region other than cervical    Wrist pain, left     Past Surgical History:  Procedure Laterality Date   ANTERIOR CRUCIATE LIGAMENT REPAIR Left [place]   CHEST TUBE INSERTION Right 10/19/2012   post bronch   COLONOSCOPY W/ POLYPECTOMY     CORONARY ARTERY BYPASS GRAFT N/A 03/24/2017   Procedure: CORONARY ARTERY BYPASS GRAFTING (CABG)x1 using left internal mammary artery, LIMA-LAD;  Surgeon: Rexene Alberts, MD;  Location: [facility];  Service: Open Heart Surgery;  Laterality: N/A;   FLEXIBLE BRONCHOSCOPY  10/19/2012   Flexible video fiberoptic bronchoscopy with electromagnetic navigation and biopsies.   INTRAVASCULAR PRESSURE WIRE/FFR STUDY N/A 08/11/2016   Procedure: Intravascular Pressure Wire/FFR Study;  Surgeon: Doshia Dalia M Martinique, MD;  Location: [facility];  Service: Cardiovascular;  Laterality: N/A;   IR ANGIOGRAM PULMONARY BILATERAL SELECTIVE  02/06/2018   IR ANGIOGRAM SELECTIVE EACH ADDITIONAL VESSEL  02/06/2018   IR ANGIOGRAM SELECTIVE EACH ADDITIONAL VESSEL  02/06/2018   IR INFUSION THROMBOL ARTERIAL INITIAL (MS)  02/06/2018   IR INFUSION THROMBOL ARTERIAL INITIAL (MS)  02/06/2018   IR THROMB F/U EVAL ART/VEN FINAL DAY (MS)  02/07/2018   IR US GUIDE VASC ACCESS RIGHT  02/06/2018   LOBECTOMY Right 09/02/2016   Procedure: RIGHT UPPER LOBECTOMY;  Surgeon: Melrose Nakayama,  MD;  Location: [facility];  Service: Thoracic;  Laterality: Right;   LYMPH NODE DISSECTION Right 09/02/2016   Procedure: LYMPH NODE DISSECTION, RIGHT LUNG;  Surgeon: Melrose Nakayama, MD;  Location: [facility];  Service: Thoracic;  Laterality: Right;   MITRAL VALVE REPAIR N/A 03/24/2017   Procedure: MITRAL VALVE REPAIR (MVR) with Sorin Carbomedics Annuloflex size 28;  Surgeon: Rexene Alberts, MD;  Location: [facility];  Service: Open Heart Surgery;  Laterality: N/A;   RIGHT/LEFT HEART CATH AND CORONARY ANGIOGRAPHY N/A 08/11/2016   Procedure: Right/Left Heart Cath and Coronary Angiography;  Surgeon: Airon Sahni M Martinique, MD;  Location: [facility];  Service: Cardiovascular;  Laterality: N/A;   TEE WITHOUT CARDIOVERSION N/A 07/17/2016   Procedure: TRANSESOPHAGEAL ECHOCARDIOGRAM (TEE);  Surgeon: [name], MD;  Location[facility];  Service: Cardiovascular;  Laterality: N/A;   TEE  WITHOUT CARDIOVERSION N/A 03/24/2017   Procedure: TRANSESOPHAGEAL ECHOCARDIOGRAM (TEE);  Surgeon: Rexene Alberts, MD;  Location: [facility];  Service: Open Heart Surgery;  Laterality: N/A;   TONSILLECTOMY  1950's   TOTAL HIP ARTHROPLASTY Left 10/05/2014   dr [name]   TOTAL HIP ARTHROPLASTY Left 10/05/2014   Procedure: LEFT TOTAL HIP ARTHROPLASTY ANTERIOR APPROACH;  Surgeon: [name], MD;  Location: [facility];  Service: Orthopedics;  Laterality: Left;   VIDEO ASSISTED THORACOSCOPY (VATS)/WEDGE RESECTION Right 09/02/2016   Procedure: VIDEO ASSISTED THORACOSCOPY (VATS)/RIGHT UPPER LOBE WEDGE RESECTION;  Surgeon: Melrose Nakayama, MD;  Location: [facility];  Service: Thoracic;  Laterality: Right;   VIDEO BRONCHOSCOPY WITH ENDOBRONCHIAL NAVIGATION N/A 10/19/2012   Procedure: VIDEO BRONCHOSCOPY WITH ENDOBRONCHIAL NAVIGATION;  Surgeon: [name], MD;  Location: [facility];  Service: Thoracic;  Laterality: N/A;   WRIST RECONSTRUCTION Left 12/2009   'proximal row carpectomy" [name]    Current Medications: Outpatient Medications Prior to  Visit  Medication Sig Dispense Refill   empagliflozin (JARDIANCE) 10 MG TABS tablet Take 1 tablet (10 mg total) by mouth daily before breakfast. 90 tablet 0   ezetimibe (ZETIA) 10 MG tablet TAKE 1 TABLET BY MOUTH EVERY DAY 90 tablet 3   furosemide (LASIX) 20 MG tablet TAKE 0.5 TABLET (10 MG TOTAL) BY MOUTH DAILY 45 tablet 0   metoprolol tartrate (LOPRESSOR) 25 MG tablet TAKE 0.5 TABLETS (12.5 MG TOTAL) BY MOUTH 2 (TWO) TIMES DAILY. 90 tablet 3   metroNIDAZOLE (METROCREAM) 0.75 % cream Apply 1 application topically in the morning and at bedtime.     Multiple Vitamin (MULTIVITAMIN) tablet Take 1 tablet by mouth daily.     Omega-3 Fatty Acids (FISH OIL) 500 MG CAPS Take 500 mg by mouth daily.      osimertinib mesylate (TAGRISSO) 80 MG tablet TAKE 1 TABLET (80 MG TOTAL) BY MOUTH DAILY. 30 tablet 2   sildenafil (REVATIO) 20 MG tablet Take 4 tablets (80 mg total) by mouth daily as needed. 60 tablet 5   XARELTO 20 MG TABS tablet TAKE 1 TABLET (20 MG TOTAL) BY MOUTH DAILY WITH SUPPER. 90 tablet 3   No facility-administered medications prior to visit.     Allergies:   Symbicort [budesonide-formoterol fumarate] and Amoxicillin   Social History   Socioeconomic History   Marital status: Married    Spouse name: Not on file   Number of children: 3   Years of education: Not on file   Highest education level: Not on file  Occupational History   Occupation: Retired, Financial risk analyst   Occupation: Retired, Real estate    Comment: Slow  Tobacco Use   Smoking status: Never   Smokeless tobacco: Never  Vaping Use   Vaping Use: Never used  Substance and Sexual Activity   Alcohol use: Yes    Alcohol/week: 1.0 standard drink    Types: 1 Cans of beer per week    Comment: none since 3 weeks   Drug use: No   Sexual activity: Never    Partners: Female  Other Topics Concern   Not on file  Social History Narrative   HSG, [employer]. Married '73.  2 sons - '74,   '76;  1 daughter -  '77   6 grandchildren.   Work - IT consultant now retired. ACP - not fully discussed.                   Social Determinants of Health  Financial Resource Strain: Not on file  Food Insecurity: Not on file  Transportation Needs: Not on file  Physical Activity: Not on file  Stress: Not on file  Social Connections: Not on file     Family History:  The patient's family history includes Heart attack in his father; Heart disease in his father and paternal uncle; Heart failure in his mother.   ROS:   Please see the history of present illness.    ROS All other systems reviewed and are negative.   PHYSICAL EXAM:   VS:  There were no vitals taken for this visit.   GENERAL:  Well appearing WM in NAD HEENT:  PERRL, EOMI, sclera are clear. Oropharynx is clear. NECK:  No jugular venous distention, carotid upstroke brisk and symmetric, no bruits, no thyromegaly or adenopathy LUNGS:  Clear to auscultation bilaterally CHEST:  Unremarkable HEART:  RRR,  PMI not displaced or sustained,S1 and S2 within normal limits, no S3, no S4: no clicks, no rubs, gr 2/6 systolic murmur LSB ABD:  Soft, nontender. BS +, no masses or bruits. No hepatomegaly, no splenomegaly EXT:  2 + pulses throughout, tr  edema, no cyanosis no clubbing SKIN:  Warm and dry.  No rashes NEURO:  Alert and oriented x 3. Cranial nerves II through XII intact. PSYCH:  Cognitively intact      Wt Readings from Last 3 Encounters:  07/07/21 233 lb (105.7 kg)  05/06/21 234 lb 1 oz (106.2 kg)  03/06/21 231 lb (104.8 kg)      Studies/Labs Reviewed:   EKG:  EKG is not ordered today.   Recent Labs: 03/06/2021: TSH 1.85 07/07/2021: ALT 37; BUN 22; Creatinine 1.29; Hemoglobin 14.6; Platelet Count 139; Potassium 4.1; Sodium 140   Lipid Panel    Component Value Date/Time   CHOL 196 01/22/2021 0829   TRIG 155 (H) 01/22/2021 0829   TRIG 108 05/20/2006 0825   HDL 45 01/22/2021 0829   CHOLHDL 4.4 01/22/2021 0829    CHOLHDL 4.2 12/26/2019 1150   VLDL 26.0 12/13/2018 1101   LDLCALC 123 (H) 01/22/2021 0829   LDLCALC 136 (H) 12/26/2019 1150   LDLDIRECT 177.0 07/24/2015 1052    Additional studies/ records that were reviewed today include:    Cath 08/11/2016 Conclusion     Prox LAD to Mid LAD lesion, 30 %stenosed. Mid LAD to Dist LAD lesion, 65 %stenosed. Prox RCA to Mid RCA lesion, 15 %stenosed. The left ventricular systolic function is normal. LV end diastolic pressure is normal. The left ventricular ejection fraction is 55-65% by visual estimate. LV end diastolic pressure is normal.   1. Borderline single vessel obstructive CAD involving the mid LAD. FFR 0.81 2. Normal LV function EF 65% 3. Normal right heart and LV filling pressures 4. Normal Cardiac output   Plan: will refer to CT surgery for consideration of MV repair. The stenosis in the LAD will be discussed. He is asymptomatic and I would favor treating it medically. If he develops symptoms in the future it could be treated with PCI.         Echo 02/15/2017 LV EF: 60% -   65%  Study Conclusions   - Left ventricle: The cavity size was normal. Wall thickness was   normal. Systolic function was normal. The estimated ejection   fraction was in the range of 60% to 65%. - Aortic valve: AV is thickened, calcified with minimally   restricted motion. - Mitral valve: Prolapse fo the posterior mitral leaflet. MR is  at   least moderate in intensity Calcified annulus. Mildly thickened   leaflets . - Left atrium: The atrium was mildly dilated.     CABG with MVR 03/24/2017 Preoperative Diagnosis:       Severe Mitral Regurgitation Single-vessel Coronary Artery Disease   Postoperative Diagnosis:    Same   Procedure:        Mitral Valve Repair             Complex valvuloplasty including artificial Gore-tex neochord placement x6             Sorin Carbomedics Annuloflex posterior annuloplasty band (size 60mm, catalog #[id number], serial  #[id number])   Coronary Artery Bypass Grafting x 1              Left Internal Mammary Artery to Distal Left Anterior Descending Coronary Artery   Echo 06/10/17: Study Conclusions   - Left ventricle: The cavity size was normal. There was moderate   concentric hypertrophy. Systolic function was vigorous. The   estimated ejection fraction was in the range of 65% to 70%. Wall   motion was normal; there were no regional wall motion   abnormalities. Doppler parameters are consistent with abnormal   left ventricular relaxation (grade 1 diastolic dysfunction). - Aortic valve: Trileaflet; mildly thickened, mildly calcified   leaflets. Transvalvular velocity was minimally increased. There   was no stenosis. There was no regurgitation. - Mitral valve: S/P mitral valve repair with fixed posterior   leaflet. Transvalvular velocity was elevated. There was no   regurgitation. - Right ventricle: The cavity size was normal. Wall thickness was   normal. Systolic function was normal. - Tricuspid valve: There was no regurgitation. - Pericardium, extracardiac: A mild pericardial effusion was   identified posterior to the heart. Features were not consistent   with tamponade physiology.   Impressions:   - Since the last study on 02/15/2017 mitral valve is post repair.   Transmitral velocities are elevated with mean gradient 8 mmHg.   LVEF is hyperdynamic.  Echo 02/07/18: Study Conclusions   - Left ventricle: The cavity size was mildly reduced. Wall   thickness was increased in a pattern of mild LVH. Systolic   function was vigorous. The estimated ejection fraction was in the   range of 65% to 70%. - Mitral valve: s/p MV repair. Motion is difficult to see due to   poor acoustic windows Peak and mean gradiaeants through the valve   are 7 and 4 mm Hg respectively. MVA by P T1/2 is 1.75 cm2   consistent with mild MS - Right ventricle: RV is difficult to visualize, even with Definity   OVerall RVEF  appears mildly reduced. The cavity size was mildly   dilated. - Right atrium: The atrium was mildly dilated.  Echo 01/29/19: IMPRESSIONS     1. Left ventricular ejection fraction, by visual estimation, is 50 to  55%. The left ventricle has normal function. Normal left ventricular size.  Left ventricular septal wall thickness was mildly increased. Mildly  increased left ventricular posterior  wall thickness. There is mildly increased left ventricular hypertrophy.   2. Elevated left ventricular end-diastolic pressure.   3. Left ventricular diastolic Doppler parameters are consistent with  pseudonormalization pattern of LV diastolic filling.   4. Global right ventricle has normal systolic function.The right  ventricular size is normal. No increase in right ventricular wall  thickness.   5. Left atrial size was normal.   6. Right atrial size was normal.  7. The mitral valve is normal in structure. Mild mitral valve  regurgitation. Mild mitral stenosis.   8. The tricuspid valve is normal in structure. Tricuspid valve  regurgitation is mild.   9. The aortic valve is normal in structure. Aortic valve regurgitation  was not visualized by color flow Doppler. Structurally normal aortic  valve, with no evidence of sclerosis or stenosis.  10. Mild thickening and calcification of the aortic valve leaflets.  11. The pulmonic valve was normal in structure. Pulmonic valve  regurgitation is trivial by color flow Doppler.  12. Normal pulmonary artery systolic pressure.  13. The inferior vena cava is normal in size with greater than 50%  respiratory variability, suggesting right atrial pressure of 3 mmHg.   ASSESSMENT:    No diagnosis found.    PLAN:  In order of problems listed above:  Severe MR s/p mitral valve annuloplasty/repair:  F/U Echo showed an excellent repair.  Recommend routine SBE prophylaxis.    2.   History of PE now recurrent on 02/06/18. Submassive with RV strain. S/p EKOS  and lytic therapy. On Xarelto now.  Will need anticoagulation indefinitely.   3.    Chronic diastolic heart failure: weight is stable.  He does have chronic  Edema more related to venous insuffieciency. Continue low dose diuretic.  Continue sodium restriction and support hose.   4.    S/p CABG x 1: LIMA to LAD  5.    Stage IV lung CA. S/p resection with recurrent malignant pleural effusion. On oral chemotherapy. Scans in April showed no progression.  6.   DM 2: Managed by primary care provider  7.   HLD off statin due to history of elevated CK on Lipitor. Focusing on lifestyle modification for now.   8. CKD stage 3a. Followed by Nephrology.   9. Elevated CPK. Not on statin. Unclear cause of persistently elevated CK. Recommend he discuss with primary care.    Medication Adjustments/Labs and Tests Ordered: Current medicines are reviewed at length with the patient today.  Concerns regarding medicines are outlined above.  Medication changes, Labs and Tests ordered today are listed in the Patient Instructions below. There are no Patient Instructions on file for this visit.   Signed, Trustin Chapa Martinique, MD  07/17/2021 7:46 AM    [address], [place], [place]  [id number] Phone: [phone]; Fax: [phone]

## 2021-07-21 ENCOUNTER — Other Ambulatory Visit: Payer: Self-pay | Admitting: Cardiology

## 2021-07-21 ENCOUNTER — Other Ambulatory Visit: Payer: Self-pay | Admitting: Internal Medicine

## 2021-07-21 ENCOUNTER — Ambulatory Visit: Payer: Medicare PPO | Admitting: Cardiology

## 2021-07-21 MED ORDER — LORAZEPAM 0.5 MG PO TABS: ORAL_TABLET | ORAL | 0 refills | Status: DC

## 2021-07-21 NOTE — Progress Notes (Signed)
This encounter was created in error - please disregard.

## 2021-07-22 ENCOUNTER — Other Ambulatory Visit: Payer: Self-pay

## 2021-07-22 ENCOUNTER — Ambulatory Visit: Payer: Medicare PPO | Admitting: Cardiology

## 2021-07-22 ENCOUNTER — Encounter: Payer: Self-pay | Admitting: Cardiology

## 2021-07-22 VITALS — BP 150/80 | HR 65 | Ht 69.0 in | Wt 235.2 lb

## 2021-07-22 DIAGNOSIS — E785 Hyperlipidemia, unspecified: Secondary | ICD-10-CM

## 2021-07-22 DIAGNOSIS — I10 Essential (primary) hypertension: Secondary | ICD-10-CM | POA: Diagnosis not present

## 2021-07-22 DIAGNOSIS — I5032 Chronic diastolic (congestive) heart failure: Secondary | ICD-10-CM

## 2021-07-22 DIAGNOSIS — Z9889 Other specified postprocedural states: Secondary | ICD-10-CM

## 2021-07-22 DIAGNOSIS — I2510 Atherosclerotic heart disease of native coronary artery without angina pectoris: Secondary | ICD-10-CM

## 2021-07-22 DIAGNOSIS — N1831 Chronic kidney disease, stage 3a: Secondary | ICD-10-CM | POA: Diagnosis not present

## 2021-07-23 ENCOUNTER — Ambulatory Visit
Admission: RE | Admit: 2021-07-23 | Discharge: 2021-07-23 | Disposition: A | Discharge status: Home / Self Care | Payer: Medicare PPO | Source: Ambulatory Visit | Attending: Internal Medicine | Admitting: Internal Medicine

## 2021-07-23 DIAGNOSIS — Z85118 Personal history of other malignant neoplasm of bronchus and lung: Secondary | ICD-10-CM | POA: Diagnosis not present

## 2021-07-23 DIAGNOSIS — M4807 Spinal stenosis, lumbosacral region: Secondary | ICD-10-CM | POA: Diagnosis not present

## 2021-07-23 DIAGNOSIS — M48061 Spinal stenosis, lumbar region without neurogenic claudication: Secondary | ICD-10-CM | POA: Diagnosis not present

## 2021-07-23 DIAGNOSIS — M47816 Spondylosis without myelopathy or radiculopathy, lumbar region: Secondary | ICD-10-CM | POA: Diagnosis not present

## 2021-07-23 DIAGNOSIS — C3490 Malignant neoplasm of unspecified part of unspecified bronchus or lung: Secondary | ICD-10-CM

## 2021-07-23 MED ORDER — GADOBENATE DIMEGLUMINE 529 MG/ML IV SOLN
20.0000 mL | Freq: Once | INTRAVENOUS | Status: AC | PRN
  Administered 2021-07-23: 20 mL via INTRAVENOUS

## 2021-07-24 ENCOUNTER — Telehealth: Payer: Self-pay

## 2021-07-24 NOTE — Telephone Encounter (Signed)
-----   Message from Curt Bears, MD sent at 07/24/2021  1:41 PM EDT ----- ?Please let him know that the MRI of the lumbar spine showed no cancer or metastasis in this area.  It is all degenerative arthritis.  Thank you ?----- Message ----- ?From: Interface, Rad Results In ?Sent: 07/24/2021  11:57 AM EDT ?To: Curt Bears, MD ? ? ?

## 2021-07-24 NOTE — Telephone Encounter (Signed)
I have left a detailed message for this pt and advised if there are any additional concerns, please call us back. ?

## 2021-07-28 ENCOUNTER — Ambulatory Visit (HOSPITAL_COMMUNITY): Payer: Medicare PPO

## 2021-07-28 DIAGNOSIS — N2581 Secondary hyperparathyroidism of renal origin: Secondary | ICD-10-CM | POA: Diagnosis not present

## 2021-07-28 DIAGNOSIS — D631 Anemia in chronic kidney disease: Secondary | ICD-10-CM | POA: Diagnosis not present

## 2021-07-28 DIAGNOSIS — N1831 Chronic kidney disease, stage 3a: Secondary | ICD-10-CM | POA: Diagnosis not present

## 2021-07-28 DIAGNOSIS — I129 Hypertensive chronic kidney disease with stage 1 through stage 4 chronic kidney disease, or unspecified chronic kidney disease: Secondary | ICD-10-CM | POA: Diagnosis not present

## 2021-08-01 ENCOUNTER — Other Ambulatory Visit (HOSPITAL_COMMUNITY): Payer: Self-pay

## 2021-08-08 ENCOUNTER — Other Ambulatory Visit (HOSPITAL_COMMUNITY): Payer: Self-pay

## 2021-08-28 ENCOUNTER — Other Ambulatory Visit: Payer: Self-pay

## 2021-08-28 ENCOUNTER — Inpatient Hospital Stay: Payer: Medicare PPO | Attending: Internal Medicine

## 2021-08-28 ENCOUNTER — Ambulatory Visit (HOSPITAL_COMMUNITY)
Admission: RE | Admit: 2021-08-28 | Discharge: 2021-08-28 | Disposition: A | Discharge status: Home / Self Care | Payer: Medicare PPO | Source: Ambulatory Visit | Attending: Internal Medicine | Admitting: Internal Medicine

## 2021-08-28 DIAGNOSIS — J90 Pleural effusion, not elsewhere classified: Secondary | ICD-10-CM | POA: Insufficient documentation

## 2021-08-28 DIAGNOSIS — J984 Other disorders of lung: Secondary | ICD-10-CM | POA: Insufficient documentation

## 2021-08-28 DIAGNOSIS — C3490 Malignant neoplasm of unspecified part of unspecified bronchus or lung: Secondary | ICD-10-CM

## 2021-08-28 DIAGNOSIS — Z79899 Other long term (current) drug therapy: Secondary | ICD-10-CM | POA: Insufficient documentation

## 2021-08-28 DIAGNOSIS — I700 Atherosclerosis of aorta: Secondary | ICD-10-CM | POA: Diagnosis not present

## 2021-08-28 DIAGNOSIS — M549 Dorsalgia, unspecified: Secondary | ICD-10-CM | POA: Insufficient documentation

## 2021-08-28 DIAGNOSIS — R21 Rash and other nonspecific skin eruption: Secondary | ICD-10-CM | POA: Insufficient documentation

## 2021-08-28 DIAGNOSIS — R918 Other nonspecific abnormal finding of lung field: Secondary | ICD-10-CM | POA: Diagnosis not present

## 2021-08-28 DIAGNOSIS — K7689 Other specified diseases of liver: Secondary | ICD-10-CM | POA: Diagnosis not present

## 2021-08-28 DIAGNOSIS — N281 Cyst of kidney, acquired: Secondary | ICD-10-CM | POA: Diagnosis not present

## 2021-08-28 DIAGNOSIS — C3411 Malignant neoplasm of upper lobe, right bronchus or lung: Secondary | ICD-10-CM | POA: Insufficient documentation

## 2021-08-28 LAB — CBC WITH DIFFERENTIAL (CANCER CENTER ONLY)
Abs Immature Granulocytes: 0.03 10*3/uL (ref 0.00–0.07)
Basophils Absolute: 0 10*3/uL (ref 0.0–0.1)
Basophils Relative: 1 %
Eosinophils Absolute: 0.1 10*3/uL (ref 0.0–0.5)
Eosinophils Relative: 2 %
HCT: 44.5 % (ref 39.0–52.0)
Hemoglobin: 14.8 g/dL (ref 13.0–17.0)
Immature Granulocytes: 1 %
Lymphocytes Relative: 26 %
Lymphs Abs: 1.5 10*3/uL (ref 0.7–4.0)
MCH: 31 pg (ref 26.0–34.0)
MCHC: 33.3 g/dL (ref 30.0–36.0)
MCV: 93.1 fL (ref 80.0–100.0)
Monocytes Absolute: 0.7 10*3/uL (ref 0.1–1.0)
Monocytes Relative: 12 %
Neutro Abs: 3.5 10*3/uL (ref 1.7–7.7)
Neutrophils Relative %: 58 %
Platelet Count: 140 10*3/uL — ABNORMAL LOW (ref 150–400)
RBC: 4.78 MIL/uL (ref 4.22–5.81)
RDW: 14.4 % (ref 11.5–15.5)
WBC Count: 5.9 10*3/uL (ref 4.0–10.5)
nRBC: 0 % (ref 0.0–0.2)

## 2021-08-28 LAB — CMP (CANCER CENTER ONLY)
ALT: 22 U/L (ref 0–44)
AST: 22 U/L (ref 15–41)
Albumin: 4 g/dL (ref 3.5–5.0)
Alkaline Phosphatase: 51 U/L (ref 38–126)
Anion gap: 7 (ref 5–15)
BUN: 21 mg/dL (ref 8–23)
CO2: 26 mmol/L (ref 22–32)
Calcium: 9 mg/dL (ref 8.9–10.3)
Chloride: 107 mmol/L (ref 98–111)
Creatinine: 1.31 mg/dL — ABNORMAL HIGH (ref 0.61–1.24)
GFR, Estimated: 58 mL/min — ABNORMAL LOW (ref >60)
Glucose, Bld: 141 mg/dL — ABNORMAL HIGH (ref 70–99)
Potassium: 4.1 mmol/L (ref 3.5–5.1)
Sodium: 140 mmol/L (ref 135–145)
Total Bilirubin: 0.6 mg/dL (ref 0.3–1.2)
Total Protein: 6.8 g/dL (ref 6.5–8.1)

## 2021-08-28 MED ORDER — SODIUM CHLORIDE (PF) 0.9 % IJ SOLN
INTRAMUSCULAR | Status: AC
  Filled 2021-08-28: qty 50

## 2021-08-28 MED ORDER — IOHEXOL 300 MG/ML  SOLN
100.0000 mL | Freq: Once | INTRAMUSCULAR | Status: AC | PRN
Start: 2021-08-28 — End: 2021-08-28
  Administered 2021-08-28: 100 mL via INTRAVENOUS

## 2021-09-01 ENCOUNTER — Inpatient Hospital Stay: Payer: Medicare PPO | Admitting: Internal Medicine

## 2021-09-01 ENCOUNTER — Encounter: Payer: Self-pay | Admitting: Internal Medicine

## 2021-09-01 ENCOUNTER — Other Ambulatory Visit: Payer: Self-pay

## 2021-09-01 VITALS — BP 154/70 | HR 71 | Temp 99.0°F | Resp 16 | Wt 234.9 lb

## 2021-09-01 DIAGNOSIS — Z5111 Encounter for antineoplastic chemotherapy: Secondary | ICD-10-CM

## 2021-09-01 DIAGNOSIS — C3491 Malignant neoplasm of unspecified part of right bronchus or lung: Secondary | ICD-10-CM

## 2021-09-01 DIAGNOSIS — M549 Dorsalgia, unspecified: Secondary | ICD-10-CM | POA: Diagnosis not present

## 2021-09-01 DIAGNOSIS — Z79899 Other long term (current) drug therapy: Secondary | ICD-10-CM | POA: Diagnosis not present

## 2021-09-01 DIAGNOSIS — C3411 Malignant neoplasm of upper lobe, right bronchus or lung: Secondary | ICD-10-CM | POA: Diagnosis not present

## 2021-09-01 DIAGNOSIS — J90 Pleural effusion, not elsewhere classified: Secondary | ICD-10-CM | POA: Diagnosis not present

## 2021-09-01 DIAGNOSIS — R21 Rash and other nonspecific skin eruption: Secondary | ICD-10-CM | POA: Diagnosis not present

## 2021-09-01 DIAGNOSIS — J984 Other disorders of lung: Secondary | ICD-10-CM | POA: Diagnosis not present

## 2021-09-01 MED ORDER — CLINDAMYCIN PHOSPHATE 1 % EX SOLN: Freq: Two times a day (BID) | CUTANEOUS | 0 refills | Status: DC

## 2021-09-01 NOTE — Progress Notes (Signed)
?    [facility] ?[phone]   [phone] ? ?OFFICE PROGRESS NOTE ? ?James Lima, MD ?[place] [address] ? ?DIAGNOSIS: Recurrent non-small cell lung cancer, adenocarcinoma initially diagnosed as stage IA (T1c, N0, M0) non-small cell lung cancer, adenocarcinoma presented with right upper lobe lung nodule in April 2018 with recurrence in June 2019. ? ?Biomarker Findings ?Microsatellite status - MS-Stable ?Tumor Mutational Burden - TMB-Low (3 Muts/Mb) ?Genomic Findings ?For a complete list of the genes assayed, please refer to the Appendix. ?EGFR exon 19 deletion ([id number]>H) ?CDKN2A/B loss ?NKX2-1 amplification ?7 Disease relevant genes with no reportable alterations: KRAS, ALK, ?BRAF, MET, RET, ERBB2, ROS1  ? ?PRIOR THERAPY: Status post right upper lobectomy with lymph node dissection under the care of Dr. [name] on 09/02/2016. ? ?CURRENT THERAPY: Tagrisso 80 mg p.o. daily.  First dose started November 05, 2017.  Status post 44 months of treatment. ? ?INTERVAL HISTORY: ?James [name] 70 y.o. male returns to the clinic today for follow-up visit accompanied by his wife.  The patient is feeling fine today with no concerning complaints except for the skin rash mainly on the lips and nose.  He also has back pain which is better than before.  He denied having any current chest pain, shortness of breath except with exertion with no cough or hemoptysis.  He has no nausea, vomiting, diarrhea or constipation.  He has no headache or visual changes.  He continues to tolerate his treatment with Tagrisso fairly well.  He had repeat CT scan of the chest, abdomen pelvis performed recently and he is here for evaluation and discussion of his scan results. ? ?MEDICAL HISTORY: ?Past Medical History:  ?Diagnosis Date  ? Adenocarcinoma of right lung, stage 1 ([place]) 09/06/2016  ? Anxiety   ? Arthritis   ? "knees, hips, back" (10/19/2012)  ? Chronic diastolic congestive heart  failure ([place])   ? Chronic lower back pain   ? Colon polyps   ? 10/27/2002, repeat letter 09/17/2007  ? Coronary artery disease   ? Coronary artery disease involving native coronary artery of native heart without angina pectoris   ? Depressive disorder, not elsewhere classified   ? no meds  ? Diabetes mellitus without complication ([place])   ? diet controlled- no med  (while in hosp 4/18 -elevated cbg  ? Dyspnea   ? Fasting hyperglycemia   ? GERD (gastroesophageal reflux disease)   ? Heart murmur   ? Hemoptysis   ? abnormal CT Chest 01/29/10 - ? new GG changes RUL > not viz on plain cxr 02/26/2010  ? Hypertension   ? Mitral regurgitation   ? severe MR 08/2016  ? MPN (myeloproliferative neoplasm) ([place])   ? 1st detected 06/04/1998  ? Obesity   ? OSA on CPAP   ? last sleep study 10 years ago  ? Other and unspecified hyperlipidemia   ? Peripheral vascular disease ([place]) 08/2016  ? after lung surgey small clots in lungs,after hip dvt-5/16  ? Pneumonia   ? 4/18  ? Positive PPD 1965  ? "non reactive in 2012" (10/19/2012)  ? Routine general medical examination at a health care facility   ? S/P CABG x 1 03/24/2017  ? [name] to LAD  ? S/P mitral valve repair 03/24/2017  ? Complex valvuloplasty including artificial Gore-tex neochord placement x6 and 28 mm Sorin Annuloflex posterior annuloplasty band  ? Special screening for malignant neoplasm of prostate   ? Spinal stenosis, unspecified  region other than cervical   ? Wrist pain, left   ? ? ?ALLERGIES:  is allergic to symbicort [budesonide-formoterol fumarate] and amoxicillin. ? ?MEDICATIONS:  ?Current Outpatient Medications  ?Medication Sig Dispense Refill  ? empagliflozin (JARDIANCE) 10 MG TABS tablet Take 1 tablet (10 mg total) by mouth daily before breakfast. 90 tablet 0  ? ezetimibe (ZETIA) 10 MG tablet TAKE 1 TABLET BY MOUTH EVERY DAY 90 tablet 3  ? furosemide (LASIX) 20 MG tablet Take 1 tablet (20 mg total) by mouth daily. 90 tablet 3  ? LORazepam (ATIVAN) 0.5 MG tablet 1 tablet p.o.  30 minutes before the MRI.  May repeat once if needed. 2 tablet 0  ? metoprolol tartrate (LOPRESSOR) 25 MG tablet TAKE 0.5 TABLETS (12.5 MG TOTAL) BY MOUTH 2 (TWO) TIMES DAILY. 90 tablet 3  ? Multiple Vitamin (MULTIVITAMIN) tablet Take 1 tablet by mouth daily.    ? Omega-3 Fatty Acids (FISH OIL) 500 MG CAPS Take 500 mg by mouth daily.     ? osimertinib mesylate (TAGRISSO) 80 MG tablet TAKE 1 TABLET (80 MG TOTAL) BY MOUTH DAILY. 30 tablet 2  ? XARELTO 20 MG TABS tablet TAKE 1 TABLET (20 MG TOTAL) BY MOUTH DAILY WITH SUPPER. 90 tablet 3  ? metroNIDAZOLE (METROCREAM) 0.75 % cream Apply 1 application topically in the morning and at bedtime. (Patient not taking: Reported on 09/01/2021)    ? sildenafil (REVATIO) 20 MG tablet Take 4 tablets (80 mg total) by mouth daily as needed. 60 tablet 5  ? ?No current facility-administered medications for this visit.  ? ? ?SURGICAL HISTORY:  ?Past Surgical History:  ?Procedure Laterality Date  ? ANTERIOR CRUCIATE LIGAMENT REPAIR Left 1967  ? CARDIAC CATHETERIZATION  2000  ? CHEST TUBE INSERTION Right 10/19/2012  ? post bronch  ? COLONOSCOPY W/ POLYPECTOMY    ? CORONARY ARTERY BYPASS GRAFT N/A 03/24/2017  ? Procedure: CORONARY ARTERY BYPASS GRAFTING (CABG)x1 using left internal mammary artery, [name]-LAD;  Surgeon: Rexene Alberts, MD;  Location: [facility];  Service: Open Heart Surgery;  Laterality: N/A;  ? FLEXIBLE BRONCHOSCOPY  10/19/2012  ? Flexible video fiberoptic bronchoscopy with electromagnetic navigation and biopsies.  ? INTRAVASCULAR PRESSURE WIRE/FFR STUDY N/A 08/11/2016  ? Procedure: Intravascular Pressure Wire/FFR Study;  Surgeon: [name], MD;  Location: [facility];  Service: Cardiovascular;  Laterality: N/A;  ? IR ANGIOGRAM PULMONARY BILATERAL SELECTIVE  02/06/2018  ? IR ANGIOGRAM SELECTIVE EACH ADDITIONAL VESSEL  02/06/2018  ? IR ANGIOGRAM SELECTIVE EACH ADDITIONAL VESSEL  02/06/2018  ? IR INFUSION THROMBOL ARTERIAL INITIAL (MS)  02/06/2018  ? IR INFUSION THROMBOL  ARTERIAL INITIAL (MS)  02/06/2018  ? IR THROMB F/U EVAL ART/VEN FINAL DAY (MS)  02/07/2018  ? IR US GUIDE VASC ACCESS RIGHT  02/06/2018  ? LOBECTOMY Right 09/02/2016  ? Procedure: RIGHT UPPER LOBECTOMY;  Surgeon: Melrose Nakayama, MD;  Location: [facility];  Service: Thoracic;  Laterality: Right;  ? LYMPH NODE DISSECTION Right 09/02/2016  ? Procedure: LYMPH NODE DISSECTION, RIGHT LUNG;  Surgeon: Melrose Nakayama, MD;  Location: [facility];  Service: Thoracic;  Laterality: Right;  ? MITRAL VALVE REPAIR N/A 03/24/2017  ? Procedure: MITRAL VALVE REPAIR (MVR) with Sorin Carbomedics Annuloflex size 28;  Surgeon: Rexene Alberts, MD;  Location: [facility];  Service: Open Heart Surgery;  Laterality: N/A;  ? RIGHT/LEFT HEART CATH AND CORONARY ANGIOGRAPHY N/A 08/11/2016  ? Procedure: Right/Left Heart Cath and Coronary Angiography;  Surgeon: [name], MD;  Location:  [facility];  Service: Cardiovascular;  Laterality: N/A;  ? TEE WITHOUT CARDIOVERSION N/A 07/17/2016  ? Procedure: TRANSESOPHAGEAL ECHOCARDIOGRAM (TEE);  Surgeon: [name], MD;  Location: [facility];  Service: Cardiovascular;  Laterality: N/A;  ? TEE WITHOUT CARDIOVERSION N/A 03/24/2017  ? Procedure: TRANSESOPHAGEAL ECHOCARDIOGRAM (TEE);  Surgeon: Rexene Alberts, MD;  Location: [facility];  Service: Open Heart Surgery;  Laterality: N/A;  ? TONSILLECTOMY  1950's  ? TOTAL HIP ARTHROPLASTY Left 10/05/2014  ? dr [name]  ? TOTAL HIP ARTHROPLASTY Left 10/05/2014  ? Procedure: LEFT TOTAL HIP ARTHROPLASTY ANTERIOR APPROACH;  Surgeon: [name], MD;  Location: [facility];  Service: Orthopedics;  Laterality: Left;  ? VIDEO ASSISTED THORACOSCOPY (VATS)/WEDGE RESECTION Right 09/02/2016  ? Procedure: VIDEO ASSISTED THORACOSCOPY (VATS)/RIGHT UPPER LOBE WEDGE RESECTION;  Surgeon: Melrose Nakayama, MD;  Location: [facility];  Service: Thoracic;  Laterality: Right;  ? VIDEO BRONCHOSCOPY WITH ENDOBRONCHIAL NAVIGATION N/A 10/19/2012  ? Procedure: VIDEO BRONCHOSCOPY WITH ENDOBRONCHIAL  NAVIGATION;  Surgeon: [name], MD;  Location: [facility];  Service: Thoracic;  Laterality: N/A;  ? WRIST RECONSTRUCTION Left 12/2009  ? 'proximal row carpectomy" [name]  ? ? ?REVIEW OF SYSTEMS:  [employer]

## 2021-09-02 ENCOUNTER — Other Ambulatory Visit (HOSPITAL_COMMUNITY): Payer: Self-pay

## 2021-09-03 NOTE — Progress Notes (Incomplete)
Thoracic Location of Tumor / Histology: Right Upper Lobe Lung cancer.  Concerning findings of nodular density in the posterior right lower lobe as well as increase and right pleural effusion suspicious for disease recurrence. ? ?Patient presented for follow-up scans. ? ?CT CAP 08/28/2021:  Increase in size in the perifissural nodule in the medial right hemithorax measuring 16 x 10 mm, findings are again concerning for recurrent/metastatic disease. Consider further evaluation with PET-CT.  Similar postsurgical changes of right upper lobectomy.  No evidence of metastatic disease within the abdomen or pelvis. ? ?MRI L Spine 07/23/2021: Advanced multilevel degenerative changes of the lumbar spine with moderate-to-severe canal stenosis at L2-3 and moderate canal stenosis at L3-4 and L4-5.  Mild bilateral foraminal stenosis at L3-4 through L5-S1.  No evidence of metastatic disease involving the lumbar spine. ? ? ?Thoracentesis: cytology consistent with recurrent adenocarcinoma. ? ? ?Biopsies of  ? ?Tobacco/Marijuana/Snuff/ETOH use:  ? ? ?Past/Anticipated interventions by cardiothoracic surgery, if any:  ?Dr. [name] ?Right Upper Lobe Wedge Resection 09/02/2016 ? ?Past/Anticipated interventions by medical oncology, if any:  ?Dr. Julien Nordmann 09/01/2021 ?-His scan showed stable disease except for increasing size of the perifissural nodule in the medial right hemothorax concerning for recurrent or metastatic disease. ?-I discussed with the patient several options for management of his condition including repeat PET scan for confirmation of this lesion versus referral to radiation oncology for consideration of SBRT. ?-After the discussion the patient is interested in meeting with radiation oncology for discussion of his option. ?-I will see him back for follow-up visit in 2 months for evaluation with repeat blood work. ? ? ?Signs/Symptoms ?Weight changes, if any: {:18581} ?Respiratory complaints, if any: {:18581} ?Hemoptysis, if  any: {:18581} ?Pain issues, if any:  {:18581} ? ?SAFETY ISSUES: ?Prior radiation? {:18581} ?Pacemaker/ICD? {:18581}  ?Possible current pregnancy?N/A ?Is the patient on methotrexate? {:18581} ? ?Current Complaints / other details:   ?

## 2021-09-04 ENCOUNTER — Encounter: Payer: Self-pay | Admitting: Radiation Oncology

## 2021-09-04 ENCOUNTER — Ambulatory Visit
Admission: RE | Admit: 2021-09-04 | Discharge: 2021-09-04 | Disposition: A | Discharge status: Home / Self Care | Payer: Medicare PPO | Source: Ambulatory Visit | Attending: Radiation Oncology | Admitting: Radiation Oncology

## 2021-09-04 ENCOUNTER — Other Ambulatory Visit: Payer: Self-pay

## 2021-09-04 DIAGNOSIS — I110 Hypertensive heart disease with heart failure: Secondary | ICD-10-CM | POA: Insufficient documentation

## 2021-09-04 DIAGNOSIS — Z7901 Long term (current) use of anticoagulants: Secondary | ICD-10-CM | POA: Insufficient documentation

## 2021-09-04 DIAGNOSIS — K7689 Other specified diseases of liver: Secondary | ICD-10-CM | POA: Insufficient documentation

## 2021-09-04 DIAGNOSIS — C3411 Malignant neoplasm of upper lobe, right bronchus or lung: Secondary | ICD-10-CM

## 2021-09-04 DIAGNOSIS — E785 Hyperlipidemia, unspecified: Secondary | ICD-10-CM | POA: Diagnosis not present

## 2021-09-04 DIAGNOSIS — Z86010 Personal history of colonic polyps: Secondary | ICD-10-CM | POA: Diagnosis not present

## 2021-09-04 DIAGNOSIS — G4733 Obstructive sleep apnea (adult) (pediatric): Secondary | ICD-10-CM | POA: Insufficient documentation

## 2021-09-04 DIAGNOSIS — Z79899 Other long term (current) drug therapy: Secondary | ICD-10-CM | POA: Insufficient documentation

## 2021-09-04 DIAGNOSIS — E1151 Type 2 diabetes mellitus with diabetic peripheral angiopathy without gangrene: Secondary | ICD-10-CM | POA: Insufficient documentation

## 2021-09-04 DIAGNOSIS — I5032 Chronic diastolic (congestive) heart failure: Secondary | ICD-10-CM | POA: Diagnosis not present

## 2021-09-04 DIAGNOSIS — I2510 Atherosclerotic heart disease of native coronary artery without angina pectoris: Secondary | ICD-10-CM | POA: Diagnosis not present

## 2021-09-04 DIAGNOSIS — C3491 Malignant neoplasm of unspecified part of right bronchus or lung: Secondary | ICD-10-CM

## 2021-09-04 DIAGNOSIS — I700 Atherosclerosis of aorta: Secondary | ICD-10-CM | POA: Insufficient documentation

## 2021-09-04 DIAGNOSIS — I3481 Nonrheumatic mitral (valve) annulus calcification: Secondary | ICD-10-CM | POA: Insufficient documentation

## 2021-09-04 DIAGNOSIS — K219 Gastro-esophageal reflux disease without esophagitis: Secondary | ICD-10-CM | POA: Diagnosis not present

## 2021-09-04 DIAGNOSIS — E669 Obesity, unspecified: Secondary | ICD-10-CM | POA: Insufficient documentation

## 2021-09-04 NOTE — Progress Notes (Signed)
Thoracic Location of Tumor / Histology: Right Upper Lobe Lung cancer.  Concerning findings of nodular density in the posterior right lower lobe as well as increase and right pleural effusion suspicious for disease recurrence. ? ?Patient presented for follow-up scans. ? ?CT CAP 08/28/2021:  Increase in size in the perifissural nodule in the medial right hemithorax measuring 16 x 10 mm, findings are again concerning for recurrent/metastatic disease. Consider further evaluation with PET-CT.  Similar postsurgical changes of right upper lobectomy.  No evidence of metastatic disease within the abdomen or pelvis. ? ?MRI L Spine 07/23/2021: Advanced multilevel degenerative changes of the lumbar spine with moderate-to-severe canal stenosis at L2-3 and moderate canal stenosis at L3-4 and L4-5.  Mild bilateral foraminal stenosis at L3-4 through L5-S1.  No evidence of metastatic disease involving the lumbar spine. ? ? ?Thoracentesis: cytology consistent with recurrent adenocarcinoma. ? ? ?Biopsies of  ? ?Tobacco/Marijuana/Snuff/ETOH use:  ? ? ?Past/Anticipated interventions by cardiothoracic surgery, if any:  ?Dr. [name] ?Right Upper Lobe Wedge Resection 09/02/2016 ? ?Past/Anticipated interventions by medical oncology, if any:  ?Dr. Julien Nordmann 09/01/2021 ?-His scan showed stable disease except for increasing size of the perifissural nodule in the medial right hemothorax concerning for recurrent or metastatic disease. ?-I discussed with the patient several options for management of his condition including repeat PET scan for confirmation of this lesion versus referral to radiation oncology for consideration of SBRT. ?-After the discussion the patient is interested in meeting with radiation oncology for discussion of his option. ?-I will see him back for follow-up visit in 2 months for evaluation with repeat blood work. ? ? ?Signs/Symptoms ?Weight changes, if any: no ?Respiratory complaints, if any: yes with activity ?Hemoptysis,  if any: no ?Pain issues, if any:  yes in back and hips ? ?SAFETY ISSUES: ?Prior radiation? no ?Pacemaker/ICD? no  ?Possible current pregnancy?N/A ?Is the patient on methotrexate? no ? ?Current Complaints / other details:   ?Vitals:  ? 09/04/21 1029  ?BP: 134/73  ?Pulse: 65  ?Resp: (!) 24  ?Temp: 97.9 ?F (36.6 ?C)  ?SpO2: 98%  ?Weight: 106.1 kg  ?Height: 5\' 7"  (1.702 m)  ? ? ?

## 2021-09-04 NOTE — Progress Notes (Addendum)
?Radiation Oncology         [phone] ?________________________________ ? ?Name: James [name]        MRN: [id number]  ?Date of Service: 09/04/2021 DOB: 12-19-1950 ? ?YN:WGNFA, James Right, MD  Curt Bears, MD    ? ?REFERRING PHYSICIAN: Curt Bears, MD ? ? ?DIAGNOSIS: The encounter diagnosis was Malignant neoplasm of upper lobe of [name] lung ([place]). ? ? ?HISTORY OF PRESENT ILLNESS: James [name] is a 71 y.o. male seen at the request of Dr. Julien Nordmann for a history of recurrent progressive Stage IA, pT1cN0M0, NSCLC, adenocarcinoma with EGFR mutation of the RUL.  The patient was treated surgically in April 2018 and was diagnosed with recurrent disease in the lung in June 2019 and since has been on [place]. He has been followed  with CT imaging at 23-monthintervals, and a CT chest abdomen pelvis on 05/02/2021 showed stability and a [name] perifissural nodule in the medial [name] hemithorax but slowly progressive since 2021 raising the concern for change.  He returned for routine restaging scan on 08/28/2021, and this nodule increased in size along the [name] hemithorax in the previous area of his wedge resection of the [name] upper lobe measuring up to 1.6 cm.  Given only local progression, Dr. MJulien Nordmannfavors continuing his TFaiththerapy and to consider local options.  He is seen today to discuss stereotactic body radiotherapy to the [name] upper lobe nodules. ? ? ? ?PREVIOUS RADIATION THERAPY: No ? ? ?PAST MEDICAL HISTORY:  ?Past Medical History:  ?Diagnosis Date  ? Adenocarcinoma of [name] lung, stage 1 ([place] 09/06/2016  ? Anxiety   ? Arthritis   ? "knees, hips, back" (10/19/2012)  ? Chronic diastolic congestive heart failure ([place]   ? Chronic lower back pain   ? Colon polyps   ? 10/27/2002, repeat letter 09/17/2007  ? Coronary artery disease   ? Coronary artery disease involving native coronary artery of native heart without angina pectoris   ? Depressive disorder, not elsewhere classified   ? no meds  ? Diabetes  m[place]itus without complication ([place]   ? diet controlled- no med  (while in hosp 4/18 -elevated cbg  ? Dyspnea   ? Fasting hyperglycemia   ? GERD (gastroesophageal reflux disease)   ? Heart murmur   ? Hemoptysis   ? abnormal CT Chest 01/29/10 - ? new GG changes RUL > not viz on plain cxr 02/26/2010  ? Hypertension   ? Mitral regurgitation   ? severe MR 08/2016  ? MPN (myeloproliferative neoplasm) ([facility]   ? 1st detected 06/04/1998  ? Obesity   ? OSA on CPAP   ? last sleep study 10 years ago  ? Other and unspecified hyperlipidemia   ? Peripheral vascular disease ([place] 08/2016  ? after lung surgey small clots in lungs,after hip dvt-5/16  ? Pneumonia   ? 4/18  ? Positive PPD 1965  ? "non reactive in 2012" (10/19/2012)  ? Routine general medical examination at a health care facility   ? S/P CABG x 1 03/24/2017  ? LIMA to LAD  ? S/P mitral valve repair 03/24/2017  ? Complex valvuloplasty including artificial Gore-tex neochord placement x6 and 28 mm Sorin Annuloflex posterior annuloplasty band  ? Special screening for malignant neoplasm of prostate   ? Spinal stenosis, unspecified region other than cervical   ? Wrist pain, left   ?   ? ? ?PAST SURGICAL HISTORY: ?Past Surgical History:  ?Procedure Laterality Date  ? ANTERIOR CRUCIATE LIGAMENT REPAIR Left  [place]  ? CHEST TUBE INSERTION [name] 10/19/2012  ? post bronch  ? COLONOSCOPY W/ POLYPECTOMY    ? CORONARY ARTERY BYPASS GRAFT N/A 03/24/2017  ? Procedure: CORONARY ARTERY BYPASS GRAFTING (CABG)x1 using left internal mammary artery, LIMA-LAD;  Surgeon: Rexene Alberts, MD;  Location: [facility];  Service: Open Heart Surgery;  Laterality: N/A;  ? FLEXIBLE BRONCHOSCOPY  10/19/2012  ? Flexible video fiberoptic bronchoscopy with electromagnetic navigation and biopsies.  ? INTRAVASCULAR PRESSURE WIRE/FFR STUDY N/A 08/11/2016  ? Procedure: Intravascular Pressure Wire/FFR Study;  Surgeon: [name], MD;  Location: [facility];  Service:  Cardiovascular;  Laterality: N/A;  ? IR ANGIOGRAM PULMONARY BILATERAL SELECTIVE  02/06/2018  ? IR ANGIOGRAM SELECTIVE EACH ADDITIONAL VESSEL  02/06/2018  ? IR ANGIOGRAM SELECTIVE EACH ADDITIONAL VESSEL  02/06/2018  ? IR INFUSION THROMBOL ARTERIAL INITIAL (MS)  02/06/2018  ? IR INFUSION THROMBOL ARTERIAL INITIAL (MS)  02/06/2018  ? IR THROMB F/U EVAL ART/VEN FINAL DAY (MS)  02/07/2018  ? IR US GUIDE VASC ACCESS [name]  02/06/2018  ? LOBECTOMY [name] 09/02/2016  ? Procedure: [name] UPPER LOBECTOMY;  Surgeon: Melrose Nakayama, MD;  Location: [facility];  Service: Thoracic;  Laterality: [name];  ? LYMPH NODE DISSECTION [name] 09/02/2016  ? Procedure: LYMPH NODE DISSECTION, [name] LUNG;  Surgeon: Melrose Nakayama, MD;  Location: [facility];  Service: Thoracic;  Laterality: [name];  ? MITRAL VALVE REPAIR N/A 03/24/2017  ? Procedure: MITRAL VALVE REPAIR (MVR) with Sorin Carbomedics Annuloflex size 28;  Surgeon: Rexene Alberts, MD;  Location: [facility];  Service: Open Heart Surgery;  Laterality: N/A;  ? [name]/LEFT HEART CATH AND CORONARY ANGIOGRAPHY N/A 08/11/2016  ? Procedure: [name]/Left Heart Cath and Coronary Angiography;  Surgeon: [name], MD;  Location: [facility];  Service: Cardiovascular;  Laterality: N/A;  ? TEE WITHOUT CARDIOVERSION N/A 07/17/2016  ? Procedure: TRANSESOPHAGEAL ECHOCARDIOGRAM (TEE);  Surgeon: [name], MD;  Location: [facility];  Service: Cardiovascular;  Laterality: N/A;  ? TEE WITHOUT CARDIOVERSION N/A 03/24/2017  ? Procedure: TRANSESOPHAGEAL ECHOCARDIOGRAM (TEE);  Surgeon: Rexene Alberts, MD;  Location: [facility];  Service: Open Heart Surgery;  Laterality: N/A;  ? TONSILLECTOMY  1950's  ? TOTAL HIP ARTHROPLASTY Left 10/05/2014  ? dr [name]  ? TOTAL HIP ARTHROPLASTY Left 10/05/2014  ? Procedure: LEFT TOTAL HIP ARTHROPLASTY ANTERIOR APPROACH;  Surgeon: [name], MD;  Location: [facility];  Service: Orthopedics;  Laterality: Left;  ? VIDEO ASSISTED THORACOSCOPY (VATS)/WEDGE RESECTION [name] 09/02/2016  ?  Procedure: VIDEO ASSISTED THORACOSCOPY (VATS)/[name] UPPER LOBE WEDGE RESECTION;  Surgeon: Melrose Nakayama, MD;  Location: [facility];  Service: Thoracic;  Laterality: [name];  ? VIDEO BRONCHOSCOPY WITH ENDOBRONCHIAL NAVIGATION N/A 10/19/2012  ? Procedure: VIDEO BRONCHOSCOPY WITH ENDOBRONCHIAL NAVIGATION;  Surgeon: [name], MD;  Location: [facility];  Service: Thoracic;  Laterality: N/A;  ? WRIST RECONSTRUCTION Left 12/2009  ? 'proximal row carpectomy" [name]  ? ? ? ?FAMILY HISTORY:  ?Family History  ?Problem Relation Age of Onset  ? Heart failure Mother   ? Heart disease Father   ? Heart attack Father   ? Heart disease Paternal Uncle   ? Prostate cancer Neg Hx   ? Colon cancer Neg Hx   ? Hypertension Neg Hx   ? Hyperlipidemia Neg Hx   ? Diabetes Neg Hx   ? ? ? ?SOCIAL HISTORY:  reports that he has never smoked. He has never used smokeless tobacco.  He reports current alcohol use of about 1.0 standard drink per week. He reports that he does not use drugs. ? ? ?ALLERGIES: Symbicort [budesonide-formoterol fumarate] and Amoxicillin ? ? ?MEDICATIONS:  ?Current Outpatient Medications  ?Medication Sig Dispense Refill  ? clindamycin (CLEOCIN-T) 1 % external solution Apply topically 2 (two) times daily. 60 mL 0  ? ezetimibe (ZETIA) 10 MG tablet TAKE 1 TABLET BY MOUTH EVERY DAY 90 tablet 3  ? furosemide (LASIX) 20 MG tablet Take 1 tablet (20 mg total) by mouth daily. 90 tablet 3  ? metoprolol tartrate (LOPRESSOR) 25 MG tablet TAKE 0.5 TABLETS (12.5 MG TOTAL) BY MOUTH 2 (TWO) TIMES DAILY. 90 tablet 3  ? Multiple Vitamin (MULTIVITAMIN) tablet Take 1 tablet by mouth daily.    ? Omega-3 Fatty Acids (FISH OIL) 500 MG CAPS Take 500 mg by mouth daily.     ? osimertinib mesylate (TAGRISSO) 80 MG tablet TAKE 1 TABLET (80 MG TOTAL) BY MOUTH DAILY. 30 tablet 2  ? XARELTO 20 MG TABS tablet TAKE 1 TABLET (20 MG TOTAL) BY MOUTH DAILY WITH SUPPER. 90 tablet 3  ? empagliflozin (JARDIANCE) 10 MG TABS tablet Take 1 tablet (10 mg total) by mouth  daily before breakfast. (Patient not taking: Reported on 09/04/2021) 90 tablet 0  ? LORazepam (ATIVAN) 0.5 MG tablet 1 tablet p.o. 30 minutes before the MRI.  May repeat once if needed. (Patient not taking: Reported o

## 2021-09-04 NOTE — Addendum Note (Signed)
Encounter addended by: Hayden Pedro, PA-C on: 09/04/2021 1:00 PM ? Actions taken: Visit diagnoses modified, Problem List modified, Clinical Note Signed

## 2021-09-05 ENCOUNTER — Other Ambulatory Visit (HOSPITAL_COMMUNITY): Payer: Self-pay

## 2021-09-05 DIAGNOSIS — C3411 Malignant neoplasm of upper lobe, right bronchus or lung: Secondary | ICD-10-CM | POA: Diagnosis not present

## 2021-09-11 ENCOUNTER — Other Ambulatory Visit: Payer: Self-pay

## 2021-09-11 ENCOUNTER — Ambulatory Visit
Admission: RE | Admit: 2021-09-11 | Discharge: 2021-09-11 | Disposition: A | Discharge status: Home / Self Care | Payer: Medicare PPO | Source: Ambulatory Visit | Attending: Radiation Oncology | Admitting: Radiation Oncology

## 2021-09-11 DIAGNOSIS — Z510 Encounter for antineoplastic radiation therapy: Secondary | ICD-10-CM | POA: Insufficient documentation

## 2021-09-11 DIAGNOSIS — C3411 Malignant neoplasm of upper lobe, right bronchus or lung: Secondary | ICD-10-CM | POA: Insufficient documentation

## 2021-09-16 ENCOUNTER — Other Ambulatory Visit: Payer: Self-pay

## 2021-09-16 ENCOUNTER — Ambulatory Visit
Admission: RE | Admit: 2021-09-16 | Discharge: 2021-09-16 | Disposition: A | Discharge status: Home / Self Care | Payer: Medicare PPO | Source: Ambulatory Visit | Attending: Radiation Oncology | Admitting: Radiation Oncology

## 2021-09-16 DIAGNOSIS — C3411 Malignant neoplasm of upper lobe, right bronchus or lung: Secondary | ICD-10-CM | POA: Diagnosis not present

## 2021-09-23 ENCOUNTER — Ambulatory Visit
Admission: RE | Admit: 2021-09-23 | Discharge: 2021-09-23 | Disposition: A | Discharge status: Home / Self Care | Payer: Medicare PPO | Source: Ambulatory Visit | Attending: Radiation Oncology | Admitting: Radiation Oncology

## 2021-09-23 ENCOUNTER — Other Ambulatory Visit: Payer: Self-pay

## 2021-09-23 DIAGNOSIS — C3411 Malignant neoplasm of upper lobe, right bronchus or lung: Secondary | ICD-10-CM | POA: Diagnosis not present

## 2021-09-23 DIAGNOSIS — Z510 Encounter for antineoplastic radiation therapy: Secondary | ICD-10-CM | POA: Diagnosis not present

## 2021-09-23 LAB — RAD ONC ARIA SESSION SUMMARY
Course Elapsed Days: 0
Plan Fractions Treated to Date: 1
Plan Prescribed Dose Per Fraction: 10 Gy
Plan Total Fractions Prescribed: 5
Plan Total Prescribed Dose: 50 Gy
Reference Point Dosage Given to Date: 10 Gy
Reference Point Session Dosage Given: 10 Gy
Session Number: 1

## 2021-09-24 ENCOUNTER — Ambulatory Visit: Payer: Medicare PPO | Admitting: Radiation Oncology

## 2021-09-25 ENCOUNTER — Ambulatory Visit: Payer: Medicare PPO | Admitting: Radiation Oncology

## 2021-09-26 ENCOUNTER — Other Ambulatory Visit: Payer: Self-pay

## 2021-09-26 ENCOUNTER — Ambulatory Visit
Admission: RE | Admit: 2021-09-26 | Discharge: 2021-09-26 | Disposition: A | Discharge status: Home / Self Care | Payer: Medicare PPO | Source: Ambulatory Visit | Attending: Radiation Oncology | Admitting: Radiation Oncology

## 2021-09-26 DIAGNOSIS — C3411 Malignant neoplasm of upper lobe, right bronchus or lung: Secondary | ICD-10-CM | POA: Diagnosis not present

## 2021-09-26 DIAGNOSIS — Z510 Encounter for antineoplastic radiation therapy: Secondary | ICD-10-CM | POA: Diagnosis not present

## 2021-09-26 LAB — RAD ONC ARIA SESSION SUMMARY
Course Elapsed Days: 3
Plan Fractions Treated to Date: 2
Plan Prescribed Dose Per Fraction: 10 Gy
Plan Total Fractions Prescribed: 5
Plan Total Prescribed Dose: 50 Gy
Reference Point Dosage Given to Date: 20 Gy
Reference Point Session Dosage Given: 10 Gy
Session Number: 2

## 2021-09-30 ENCOUNTER — Ambulatory Visit
Admission: RE | Admit: 2021-09-30 | Discharge: 2021-09-30 | Disposition: A | Discharge status: Home / Self Care | Payer: Medicare PPO | Source: Ambulatory Visit | Attending: Radiation Oncology | Admitting: Radiation Oncology

## 2021-09-30 ENCOUNTER — Other Ambulatory Visit: Payer: Self-pay

## 2021-09-30 DIAGNOSIS — C3411 Malignant neoplasm of upper lobe, right bronchus or lung: Secondary | ICD-10-CM | POA: Diagnosis not present

## 2021-09-30 DIAGNOSIS — Z510 Encounter for antineoplastic radiation therapy: Secondary | ICD-10-CM | POA: Diagnosis not present

## 2021-09-30 LAB — RAD ONC ARIA SESSION SUMMARY
Course Elapsed Days: 7
Plan Fractions Treated to Date: 3
Plan Prescribed Dose Per Fraction: 10 Gy
Plan Total Fractions Prescribed: 5
Plan Total Prescribed Dose: 50 Gy
Reference Point Dosage Given to Date: 30 Gy
Reference Point Session Dosage Given: 10 Gy
Session Number: 3

## 2021-10-01 ENCOUNTER — Other Ambulatory Visit: Payer: Self-pay | Admitting: Physician Assistant

## 2021-10-01 ENCOUNTER — Other Ambulatory Visit (HOSPITAL_COMMUNITY): Payer: Self-pay

## 2021-10-01 DIAGNOSIS — C3491 Malignant neoplasm of unspecified part of right bronchus or lung: Secondary | ICD-10-CM

## 2021-10-01 MED ORDER — OSIMERTINIB MESYLATE 80 MG PO TABS
ORAL_TABLET | Freq: Every day | ORAL | 2 refills | Status: DC
  Filled 2021-10-03: qty 30, 30d supply, fill #0
  Filled 2021-10-28: qty 30, 30d supply, fill #1
  Filled 2021-11-24: qty 30, 30d supply, fill #2

## 2021-10-02 ENCOUNTER — Other Ambulatory Visit: Payer: Self-pay

## 2021-10-02 ENCOUNTER — Ambulatory Visit
Admission: RE | Admit: 2021-10-02 | Discharge: 2021-10-02 | Disposition: A | Discharge status: Home / Self Care | Payer: Medicare PPO | Source: Ambulatory Visit | Attending: Radiation Oncology | Admitting: Radiation Oncology

## 2021-10-02 DIAGNOSIS — C3411 Malignant neoplasm of upper lobe, right bronchus or lung: Secondary | ICD-10-CM | POA: Diagnosis not present

## 2021-10-02 DIAGNOSIS — Z510 Encounter for antineoplastic radiation therapy: Secondary | ICD-10-CM | POA: Diagnosis not present

## 2021-10-02 LAB — RAD ONC ARIA SESSION SUMMARY
Course Elapsed Days: 9
Plan Fractions Treated to Date: 4
Plan Prescribed Dose Per Fraction: 10 Gy
Plan Total Fractions Prescribed: 5
Plan Total Prescribed Dose: 50 Gy
Reference Point Dosage Given to Date: 40 Gy
Reference Point Session Dosage Given: 10 Gy
Session Number: 4

## 2021-10-03 ENCOUNTER — Other Ambulatory Visit (HOSPITAL_COMMUNITY): Payer: Self-pay

## 2021-10-07 ENCOUNTER — Encounter: Payer: Self-pay | Admitting: Radiation Oncology

## 2021-10-07 ENCOUNTER — Other Ambulatory Visit: Payer: Self-pay

## 2021-10-07 ENCOUNTER — Ambulatory Visit
Admission: RE | Admit: 2021-10-07 | Discharge: 2021-10-07 | Disposition: A | Discharge status: Home / Self Care | Payer: Medicare PPO | Source: Ambulatory Visit | Attending: Radiation Oncology | Admitting: Radiation Oncology

## 2021-10-07 DIAGNOSIS — Z510 Encounter for antineoplastic radiation therapy: Secondary | ICD-10-CM | POA: Diagnosis not present

## 2021-10-07 DIAGNOSIS — C3411 Malignant neoplasm of upper lobe, right bronchus or lung: Secondary | ICD-10-CM | POA: Diagnosis not present

## 2021-10-07 DIAGNOSIS — C3491 Malignant neoplasm of unspecified part of right bronchus or lung: Secondary | ICD-10-CM

## 2021-10-07 LAB — RAD ONC ARIA SESSION SUMMARY
Course Elapsed Days: 14
Plan Fractions Treated to Date: 5
Plan Prescribed Dose Per Fraction: 10 Gy
Plan Total Fractions Prescribed: 5
Plan Total Prescribed Dose: 50 Gy
Reference Point Dosage Given to Date: 50 Gy
Reference Point Session Dosage Given: 10 Gy
Session Number: 5

## 2021-10-07 NOTE — Progress Notes (Signed)
  Radiation Oncology         [phone] ________________________________  Name: James [name] MRN: [id number]  Date: 10/07/2021  DOB: Feb 02, 1951  End of Treatment Note  Diagnosis:     Recurrent Progressive Stage IA, pT1cN0M0, NSCLC, adenocarcinoma with EGFR mutation of the RUL  Indication for treatment:  Curative       Radiation treatment dates:   09/23/21-10/07/21 SBRT treatment  Site/dose:     Site Technique Total Dose (Gy) Dose per Fx (Gy) Completed Fx Beam Energies  Lung, Right: Lung_R IMRT 50/50 10 5/5 6XFFF    Narrative: The patient tolerated radiation treatment relatively well.   The patient did not have any signs of acute toxicity during treatment.  Plan: The patient will receive a call in about one month from the radiation oncology department.      Carola Rhine, PAC

## 2021-10-08 ENCOUNTER — Telehealth: Payer: Self-pay | Admitting: Internal Medicine

## 2021-10-08 NOTE — Telephone Encounter (Signed)
Called patient regarding upcoming June appointments, patient is notified.  

## 2021-10-09 ENCOUNTER — Encounter: Payer: Self-pay | Admitting: Internal Medicine

## 2021-10-09 ENCOUNTER — Other Ambulatory Visit (HOSPITAL_COMMUNITY): Payer: Self-pay

## 2021-10-09 DIAGNOSIS — Z96642 Presence of left artificial hip joint: Secondary | ICD-10-CM | POA: Diagnosis not present

## 2021-10-19 ENCOUNTER — Other Ambulatory Visit: Payer: Self-pay | Admitting: Cardiology

## 2021-10-28 ENCOUNTER — Other Ambulatory Visit (HOSPITAL_COMMUNITY): Payer: Self-pay

## 2021-11-03 ENCOUNTER — Encounter: Payer: Self-pay | Admitting: Internal Medicine

## 2021-11-03 ENCOUNTER — Inpatient Hospital Stay: Payer: Medicare PPO | Attending: Internal Medicine

## 2021-11-03 ENCOUNTER — Inpatient Hospital Stay (HOSPITAL_BASED_OUTPATIENT_CLINIC_OR_DEPARTMENT_OTHER): Payer: Medicare PPO | Admitting: Internal Medicine

## 2021-11-03 ENCOUNTER — Other Ambulatory Visit: Payer: Self-pay

## 2021-11-03 VITALS — BP 142/73 | HR 84 | Temp 97.7°F | Resp 19 | Wt 236.0 lb

## 2021-11-03 DIAGNOSIS — Z902 Acquired absence of lung [part of]: Secondary | ICD-10-CM | POA: Insufficient documentation

## 2021-11-03 DIAGNOSIS — R21 Rash and other nonspecific skin eruption: Secondary | ICD-10-CM | POA: Insufficient documentation

## 2021-11-03 DIAGNOSIS — C3490 Malignant neoplasm of unspecified part of unspecified bronchus or lung: Secondary | ICD-10-CM

## 2021-11-03 DIAGNOSIS — M25551 Pain in right hip: Secondary | ICD-10-CM | POA: Diagnosis not present

## 2021-11-03 DIAGNOSIS — C3411 Malignant neoplasm of upper lobe, right bronchus or lung: Secondary | ICD-10-CM | POA: Insufficient documentation

## 2021-11-03 DIAGNOSIS — C3491 Malignant neoplasm of unspecified part of right bronchus or lung: Secondary | ICD-10-CM

## 2021-11-03 DIAGNOSIS — M545 Low back pain, unspecified: Secondary | ICD-10-CM | POA: Insufficient documentation

## 2021-11-03 DIAGNOSIS — J90 Pleural effusion, not elsewhere classified: Secondary | ICD-10-CM | POA: Diagnosis not present

## 2021-11-03 LAB — CBC WITH DIFFERENTIAL (CANCER CENTER ONLY)
Abs Immature Granulocytes: 0.01 10*3/uL (ref 0.00–0.07)
Basophils Absolute: 0 10*3/uL (ref 0.0–0.1)
Basophils Relative: 0 %
Eosinophils Absolute: 0.2 10*3/uL (ref 0.0–0.5)
Eosinophils Relative: 3 %
HCT: 42.1 % (ref 39.0–52.0)
Hemoglobin: 14.3 g/dL (ref 13.0–17.0)
Immature Granulocytes: 0 %
Lymphocytes Relative: 21 %
Lymphs Abs: 1.2 10*3/uL (ref 0.7–4.0)
MCH: 31.4 pg (ref 26.0–34.0)
MCHC: 34 g/dL (ref 30.0–36.0)
MCV: 92.5 fL (ref 80.0–100.0)
Monocytes Absolute: 0.7 10*3/uL (ref 0.1–1.0)
Monocytes Relative: 12 %
Neutro Abs: 3.5 10*3/uL (ref 1.7–7.7)
Neutrophils Relative %: 64 %
Platelet Count: 150 10*3/uL (ref 150–400)
RBC: 4.55 MIL/uL (ref 4.22–5.81)
RDW: 15.2 % (ref 11.5–15.5)
WBC Count: 5.5 10*3/uL (ref 4.0–10.5)
nRBC: 0 % (ref 0.0–0.2)

## 2021-11-03 LAB — CMP (CANCER CENTER ONLY)
ALT: 23 U/L (ref 0–44)
AST: 23 U/L (ref 15–41)
Albumin: 4 g/dL (ref 3.5–5.0)
Alkaline Phosphatase: 60 U/L (ref 38–126)
Anion gap: 6 (ref 5–15)
BUN: 24 mg/dL — ABNORMAL HIGH (ref 8–23)
CO2: 25 mmol/L (ref 22–32)
Calcium: 9.4 mg/dL (ref 8.9–10.3)
Chloride: 107 mmol/L (ref 98–111)
Creatinine: 1.33 mg/dL — ABNORMAL HIGH (ref 0.61–1.24)
GFR, Estimated: 57 mL/min — ABNORMAL LOW (ref >60)
Glucose, Bld: 164 mg/dL — ABNORMAL HIGH (ref 70–99)
Potassium: 4 mmol/L (ref 3.5–5.1)
Sodium: 138 mmol/L (ref 135–145)
Total Bilirubin: 0.5 mg/dL (ref 0.3–1.2)
Total Protein: 6.6 g/dL (ref 6.5–8.1)

## 2021-11-04 ENCOUNTER — Telehealth: Payer: Self-pay | Admitting: Internal Medicine

## 2021-11-07 ENCOUNTER — Other Ambulatory Visit (HOSPITAL_COMMUNITY): Payer: Self-pay

## 2021-11-16 ENCOUNTER — Other Ambulatory Visit: Payer: Self-pay | Admitting: Cardiology

## 2021-11-24 ENCOUNTER — Other Ambulatory Visit (HOSPITAL_COMMUNITY): Payer: Self-pay

## 2021-11-24 ENCOUNTER — Ambulatory Visit
Admission: RE | Admit: 2021-11-24 | Discharge: 2021-11-24 | Disposition: A | Discharge status: Home / Self Care | Payer: Medicare PPO | Source: Ambulatory Visit | Attending: Radiation Oncology | Admitting: Radiation Oncology

## 2021-11-24 DIAGNOSIS — C3491 Malignant neoplasm of unspecified part of right bronchus or lung: Secondary | ICD-10-CM

## 2021-11-24 NOTE — Progress Notes (Addendum)
  Radiation Oncology         [phone] ________________________________  Name: James [name] MRN: [id number]  Date of Service: 11/24/2021  DOB: [birth year]  Post Treatment Telephone Note  Diagnosis:    Recurrent Progressive Stage IA, pT1cN0M0, NSCLC, adenocarcinoma with EGFR mutation of the RUL   Indication for treatment:  Curative        Radiation treatment dates:   09/23/21-10/07/21 SBRT treatment   Site/dose:     Site Technique Total Dose (Gy) Dose per Fx (Gy) Completed Fx Beam Energies  Lung, Right: Lung_R IMRT 50/50 10 5/5 6XFFF      Narrative: The patient tolerated radiation treatment relatively well.   The patient did not have any signs of acute toxicity during treatment. He reports he hurt his back and is working with an orthopedist regarding back pain that started during his course of radiation.   Impression/Plan: 1. Recurrent Progressive Stage IA, pT1cN0M0, NSCLC, adenocarcinoma with EGFR mutation of the RUL. The patient has been doing well since completion of radiotherapy. We discussed that we would be happy to continue to follow him as needed, but he will also continue to follow up with Dr. Julien Nordmann in medical oncology. I encouraged him to follow up with orthopedics as well.      Carola Rhine, PAC

## 2021-11-26 ENCOUNTER — Encounter: Payer: Self-pay | Admitting: Internal Medicine

## 2021-12-01 ENCOUNTER — Other Ambulatory Visit (HOSPITAL_COMMUNITY): Payer: Self-pay

## 2021-12-18 ENCOUNTER — Other Ambulatory Visit (HOSPITAL_COMMUNITY): Payer: Self-pay

## 2022-01-01 ENCOUNTER — Other Ambulatory Visit: Payer: Medicare PPO

## 2022-01-01 ENCOUNTER — Other Ambulatory Visit: Payer: Self-pay

## 2022-01-01 ENCOUNTER — Ambulatory Visit (HOSPITAL_COMMUNITY)
Admission: RE | Admit: 2022-01-01 | Discharge: 2022-01-01 | Disposition: A | Discharge status: Home / Self Care | Payer: Medicare PPO | Source: Ambulatory Visit | Attending: Internal Medicine | Admitting: Internal Medicine

## 2022-01-01 ENCOUNTER — Inpatient Hospital Stay: Payer: Medicare PPO | Attending: Internal Medicine

## 2022-01-01 DIAGNOSIS — Z79899 Other long term (current) drug therapy: Secondary | ICD-10-CM | POA: Insufficient documentation

## 2022-01-01 DIAGNOSIS — J90 Pleural effusion, not elsewhere classified: Secondary | ICD-10-CM | POA: Diagnosis not present

## 2022-01-01 DIAGNOSIS — C3490 Malignant neoplasm of unspecified part of unspecified bronchus or lung: Secondary | ICD-10-CM

## 2022-01-01 DIAGNOSIS — C3411 Malignant neoplasm of upper lobe, right bronchus or lung: Secondary | ICD-10-CM | POA: Insufficient documentation

## 2022-01-01 DIAGNOSIS — N281 Cyst of kidney, acquired: Secondary | ICD-10-CM | POA: Diagnosis not present

## 2022-01-01 DIAGNOSIS — Z7901 Long term (current) use of anticoagulants: Secondary | ICD-10-CM | POA: Insufficient documentation

## 2022-01-01 DIAGNOSIS — Z902 Acquired absence of lung [part of]: Secondary | ICD-10-CM | POA: Insufficient documentation

## 2022-01-01 DIAGNOSIS — Z86718 Personal history of other venous thrombosis and embolism: Secondary | ICD-10-CM | POA: Insufficient documentation

## 2022-01-01 DIAGNOSIS — Z923 Personal history of irradiation: Secondary | ICD-10-CM | POA: Insufficient documentation

## 2022-01-01 DIAGNOSIS — K7689 Other specified diseases of liver: Secondary | ICD-10-CM | POA: Diagnosis not present

## 2022-01-01 LAB — CBC WITH DIFFERENTIAL (CANCER CENTER ONLY)
Abs Immature Granulocytes: 0.05 10*3/uL (ref 0.00–0.07)
Basophils Absolute: 0 10*3/uL (ref 0.0–0.1)
Basophils Relative: 0 %
Eosinophils Absolute: 0.2 10*3/uL (ref 0.0–0.5)
Eosinophils Relative: 2 %
HCT: 42.4 % (ref 39.0–52.0)
Hemoglobin: 14.5 g/dL (ref 13.0–17.0)
Immature Granulocytes: 1 %
Lymphocytes Relative: 22 %
Lymphs Abs: 1.5 10*3/uL (ref 0.7–4.0)
MCH: 31.9 pg (ref 26.0–34.0)
MCHC: 34.2 g/dL (ref 30.0–36.0)
MCV: 93.4 fL (ref 80.0–100.0)
Monocytes Absolute: 0.8 10*3/uL (ref 0.1–1.0)
Monocytes Relative: 12 %
Neutro Abs: 4.2 10*3/uL (ref 1.7–7.7)
Neutrophils Relative %: 63 %
Platelet Count: 152 10*3/uL (ref 150–400)
RBC: 4.54 MIL/uL (ref 4.22–5.81)
RDW: 14.7 % (ref 11.5–15.5)
WBC Count: 6.7 10*3/uL (ref 4.0–10.5)
nRBC: 0 % (ref 0.0–0.2)

## 2022-01-01 LAB — CMP (CANCER CENTER ONLY)
ALT: 24 U/L (ref 0–44)
AST: 22 U/L (ref 15–41)
Albumin: 4 g/dL (ref 3.5–5.0)
Alkaline Phosphatase: 58 U/L (ref 38–126)
Anion gap: 4 — ABNORMAL LOW (ref 5–15)
BUN: 19 mg/dL (ref 8–23)
CO2: 28 mmol/L (ref 22–32)
Calcium: 9.4 mg/dL (ref 8.9–10.3)
Chloride: 108 mmol/L (ref 98–111)
Creatinine: 1.25 mg/dL — ABNORMAL HIGH (ref 0.61–1.24)
GFR, Estimated: >60 mL/min (ref >60)
Glucose, Bld: 129 mg/dL — ABNORMAL HIGH (ref 70–99)
Potassium: 4.3 mmol/L (ref 3.5–5.1)
Sodium: 140 mmol/L (ref 135–145)
Total Bilirubin: 0.5 mg/dL (ref 0.3–1.2)
Total Protein: 6.4 g/dL — ABNORMAL LOW (ref 6.5–8.1)

## 2022-01-01 MED ORDER — IOHEXOL 300 MG/ML  SOLN
100.0000 mL | Freq: Once | INTRAMUSCULAR | Status: AC | PRN
  Administered 2022-01-01: 100 mL via INTRAVENOUS

## 2022-01-01 MED ORDER — SODIUM CHLORIDE (PF) 0.9 % IJ SOLN
INTRAMUSCULAR | Status: AC
  Filled 2022-01-01: qty 50

## 2022-01-02 ENCOUNTER — Other Ambulatory Visit: Payer: Self-pay | Admitting: Physician Assistant

## 2022-01-02 ENCOUNTER — Other Ambulatory Visit (HOSPITAL_COMMUNITY): Payer: Self-pay

## 2022-01-02 DIAGNOSIS — C3491 Malignant neoplasm of unspecified part of right bronchus or lung: Secondary | ICD-10-CM

## 2022-01-04 MED ORDER — OSIMERTINIB MESYLATE 80 MG PO TABS
ORAL_TABLET | Freq: Every day | ORAL | 2 refills | Status: DC
  Filled 2022-01-04: qty 30, 30d supply, fill #0
  Filled 2022-02-05: qty 30, 30d supply, fill #1
  Filled 2022-03-04: qty 30, 30d supply, fill #2

## 2022-01-05 ENCOUNTER — Inpatient Hospital Stay: Payer: Medicare PPO | Admitting: Internal Medicine

## 2022-01-05 ENCOUNTER — Other Ambulatory Visit: Payer: Self-pay

## 2022-01-05 ENCOUNTER — Other Ambulatory Visit (HOSPITAL_COMMUNITY): Payer: Self-pay

## 2022-01-05 VITALS — BP 147/86 | HR 101 | Temp 98.5°F | Resp 16 | Wt 229.7 lb

## 2022-01-05 DIAGNOSIS — Z923 Personal history of irradiation: Secondary | ICD-10-CM | POA: Diagnosis not present

## 2022-01-05 DIAGNOSIS — C3411 Malignant neoplasm of upper lobe, right bronchus or lung: Secondary | ICD-10-CM | POA: Diagnosis not present

## 2022-01-05 DIAGNOSIS — C3491 Malignant neoplasm of unspecified part of right bronchus or lung: Secondary | ICD-10-CM

## 2022-01-05 DIAGNOSIS — Z79899 Other long term (current) drug therapy: Secondary | ICD-10-CM | POA: Diagnosis not present

## 2022-01-05 DIAGNOSIS — Z5111 Encounter for antineoplastic chemotherapy: Secondary | ICD-10-CM

## 2022-01-05 DIAGNOSIS — Z7901 Long term (current) use of anticoagulants: Secondary | ICD-10-CM | POA: Diagnosis not present

## 2022-01-05 DIAGNOSIS — Z902 Acquired absence of lung [part of]: Secondary | ICD-10-CM | POA: Diagnosis not present

## 2022-01-05 DIAGNOSIS — Z86718 Personal history of other venous thrombosis and embolism: Secondary | ICD-10-CM | POA: Diagnosis not present

## 2022-01-05 NOTE — Progress Notes (Signed)
[place] Telephone:([phone]) [phone]   Fax:(336) [phone]  OFFICE PROGRESS NOTE  [name], MD [place] [address]  DIAGNOSIS: Recurrent non-small cell lung cancer, adenocarcinoma initially diagnosed as stage IA (T1c, N0, M0) non-small cell lung cancer, adenocarcinoma presented with right upper lobe lung nodule in April 2018 with recurrence in June 2019.  Biomarker Findings Microsatellite status - MS-Stable Tumor Mutational Burden - TMB-Low (3 Muts/Mb) Genomic Findings For a complete list of the genes assayed, please refer to the Appendix. EGFR exon 19 deletion ([id number]>M) CDKN2A/B loss NKX2-1 amplification 7 Disease relevant genes with no reportable alterations: KRAS, ALK, BRAF, MET, RET, ERBB2, ROS1   PRIOR THERAPY:  1) Status post right upper lobectomy with lymph node dissection under the care of Dr. [name] on 09/02/2016. 2) status post palliative radiotherapy to enlarging right upper lobe lung nodule under the care of Dr. Lisbeth Renshaw completed on Oct 07, 2021.  CURRENT THERAPY: Tagrisso 80 mg p.o. daily.  First dose started November 05, 2017.  Status post 48 months of treatment.  INTERVAL HISTORY: James [name] 71 y.o. male returns to the clinic today for follow-up visit accompanied by his wife.  The patient is feeling fine today with no concerning complaints except for mild shortness of breath with exertion.  His back pain has improved after treatment with a course of prednisone.  The patient denied having any chest pain, cough or hemoptysis.  He has no nausea, vomiting, diarrhea or constipation.  He has no headache or visual changes.  He denied having any fever or chills.  He continues to tolerate his treatment with Tagrisso fairly well.  He is here today for evaluation with repeat CT scan of the chest, abdomen and pelvis for restaging of his disease.  MEDICAL HISTORY: Past Medical History:  Diagnosis Date   Adenocarcinoma of right  lung, stage 1 ([place]) 09/06/2016   Anxiety    Arthritis    "knees, hips, back" (10/19/2012)   Chronic diastolic congestive heart failure (HCC)    Chronic lower back pain    Colon polyps    10/27/2002, repeat letter 09/17/2007   Coronary artery disease    Coronary artery disease involving native coronary artery of native heart without angina pectoris    Depressive disorder, not elsewhere classified    no meds   Diabetes mellitus without complication ([place])    diet controlled- no med  (while in hosp 4/18 -elevated cbg   Dyspnea    Fasting hyperglycemia    GERD (gastroesophageal reflux disease)    Heart murmur    Hemoptysis    abnormal CT Chest 01/29/10 - ? new GG changes RUL > not viz on plain cxr 02/26/2010   Hypertension    Mitral regurgitation    severe MR 08/2016   MPN (myeloproliferative neoplasm) ([place])    1st detected 06/04/1998   Obesity    OSA on CPAP    last sleep study 10 years ago   Other and unspecified hyperlipidemia    Peripheral vascular disease ([place]) 08/2016   after lung surgey small clots in lungs,after hip dvt-5/16   Pneumonia    4/18   Positive PPD 1965   "non reactive in 2012" (10/19/2012)   Routine general medical examination at a health care facility    S/P CABG x 1 03/24/2017   LIMA to LAD   S/P mitral valve repair 03/24/2017   Complex valvuloplasty including artificial Gore-tex neochord placement x6 and 28  mm Sorin Annuloflex posterior annuloplasty band   Special screening for malignant neoplasm of prostate    Spinal stenosis, unspecified region other than cervical    Wrist pain, left     ALLERGIES:  is allergic to symbicort [budesonide-formoterol fumarate] and amoxicillin.  MEDICATIONS:  Current Outpatient Medications  Medication Sig Dispense Refill   clindamycin (CLEOCIN-T) 1 % external solution Apply topically 2 (two) times daily. 60 mL 0   empagliflozin (JARDIANCE) 10 MG TABS tablet Take 1 tablet (10 mg total) by mouth daily before breakfast. (Patient  not taking: Reported on 09/04/2021) 90 tablet 0   ezetimibe (ZETIA) 10 MG tablet TAKE 1 TABLET BY MOUTH EVERY DAY 90 tablet 3   furosemide (LASIX) 20 MG tablet Take 1 tablet (20 mg total) by mouth daily. 90 tablet 3   LORazepam (ATIVAN) 0.5 MG tablet 1 tablet p.o. 30 minutes before the MRI.  May repeat once if needed. (Patient not taking: Reported on 09/04/2021) 2 tablet 0   metoprolol tartrate (LOPRESSOR) 25 MG tablet TAKE 0.5 TABLETS BY MOUTH 2 TIMES DAILY. 90 tablet 3   Multiple Vitamin (MULTIVITAMIN) tablet Take 1 tablet by mouth daily.     Omega-3 Fatty Acids (FISH OIL) 500 MG CAPS Take 500 mg by mouth daily.      osimertinib mesylate (TAGRISSO) 80 MG tablet TAKE 1 TABLET (80 MG TOTAL) BY MOUTH DAILY. 30 tablet 2   sildenafil (REVATIO) 20 MG tablet Take 4 tablets (80 mg total) by mouth daily as needed. (Patient not taking: Reported on 09/04/2021) 60 tablet 5   XARELTO 20 MG TABS tablet TAKE 1 TABLET BY MOUTH DAILY WITH SUPPER. 90 tablet 3   No current facility-administered medications for this visit.    SURGICAL HISTORY:  Past Surgical History:  Procedure Laterality Date   ANTERIOR CRUCIATE LIGAMENT REPAIR Left 1967   CARDIAC CATHETERIZATION  2000   CHEST TUBE INSERTION Right 10/19/2012   post bronch   COLONOSCOPY W/ POLYPECTOMY     CORONARY ARTERY BYPASS GRAFT N/A 03/24/2017   Procedure: CORONARY ARTERY BYPASS GRAFTING (CABG)x1 using left internal mammary artery, LIMA-LAD;  Surgeon: Rexene Alberts, MD;  Location: [facility];  Service: Open Heart Surgery;  Laterality: N/A;   FLEXIBLE BRONCHOSCOPY  10/19/2012   Flexible video fiberoptic bronchoscopy with electromagnetic navigation and biopsies.   INTRAVASCULAR PRESSURE WIRE/FFR STUDY N/A 08/11/2016   Procedure: Intravascular Pressure Wire/FFR Study;  Surgeon: [name], MD;  Location: [facility];  Service: Cardiovascular;  Laterality: N/A;   IR ANGIOGRAM PULMONARY BILATERAL SELECTIVE  02/06/2018   IR ANGIOGRAM SELECTIVE EACH  ADDITIONAL VESSEL  02/06/2018   IR ANGIOGRAM SELECTIVE EACH ADDITIONAL VESSEL  02/06/2018   IR INFUSION THROMBOL ARTERIAL INITIAL (MS)  02/06/2018   IR INFUSION THROMBOL ARTERIAL INITIAL (MS)  02/06/2018   IR THROMB F/U EVAL ART/VEN FINAL DAY (MS)  02/07/2018   IR US GUIDE VASC ACCESS RIGHT  02/06/2018   LOBECTOMY Right 09/02/2016   Procedure: RIGHT UPPER LOBECTOMY;  Surgeon: Melrose Nakayama, MD;  Location: [facility];  Service: Thoracic;  Laterality: Right;   LYMPH NODE DISSECTION Right 09/02/2016   Procedure: LYMPH NODE DISSECTION, RIGHT LUNG;  Surgeon: Melrose Nakayama, MD;  Location: [facility];  Service: Thoracic;  Laterality: Right;   MITRAL VALVE REPAIR N/A 03/24/2017   Procedure: MITRAL VALVE REPAIR (MVR) with Sorin [employer]omedics Annuloflex size 28;  Surgeon: Rexene Alberts, MD;  Location: [facility];  Service: Open Heart Surgery;  Laterality: N/A;   RIGHT/LEFT  HEART CATH AND CORONARY ANGIOGRAPHY N/A 08/11/2016   Procedure: Right/Left Heart Cath and Coronary Angiography;  Surgeon: [name], MD;  Location: [facility];  Service: Cardiovascular;  Laterality: N/A;   TEE WITHOUT CARDIOVERSION N/A 07/17/2016   Procedure: TRANSESOPHAGEAL ECHOCARDIOGRAM (TEE);  Surgeon: [name], MD;  Location: [facility];  Service: Cardiovascular;  Laterality: N/A;   TEE WITHOUT CARDIOVERSION N/A 03/24/2017   Procedure: TRANSESOPHAGEAL ECHOCARDIOGRAM (TEE);  Surgeon: Rexene Alberts, MD;  Location: [facility];  Service: Open Heart Surgery;  Laterality: N/A;   TONSILLECTOMY  1950's   TOTAL HIP ARTHROPLASTY Left 10/05/2014   dr [name]   TOTAL HIP ARTHROPLASTY Left 10/05/2014   Procedure: LEFT TOTAL HIP ARTHROPLASTY ANTERIOR APPROACH;  Surgeon: [name], MD;  Location: [facility];  Service: Orthopedics;  Laterality: Left;   VIDEO ASSISTED THORACOSCOPY (VATS)/WEDGE RESECTION Right 09/02/2016   Procedure: VIDEO ASSISTED THORACOSCOPY (VATS)/RIGHT UPPER LOBE WEDGE RESECTION;  Surgeon: Melrose Nakayama, MD;   Location: [facility];  Service: Thoracic;  Laterality: Right;   VIDEO BRONCHOSCOPY WITH ENDOBRONCHIAL NAVIGATION N/A 10/19/2012   Procedure: VIDEO BRONCHOSCOPY WITH ENDOBRONCHIAL NAVIGATION;  Surgeon: [name], MD;  Location: [facility];  Service: Thoracic;  Laterality: N/A;   WRIST RECONSTRUCTION Left 12/2009   'proximal row carpectomy" [name]    REVIEW OF SYSTEMS:  Constitutional: negative Eyes: negative Ears, nose, mouth, throat, and face: negative Respiratory: positive for dyspnea on exertion Cardiovascular: negative Gastrointestinal: negative Genitourinary:negative Integument/breast: negative Hematologic/lymphatic: negative Musculoskeletal:positive for back pain Neurological: negative Behavioral/Psych: negative Endocrine: negative Allergic/Immunologic: negative   PHYSICAL EXAMINATION: General appearance: alert, cooperative, fatigued, and no distress Head: Normocephalic, without obvious abnormality, atraumatic Neck: no adenopathy, no JVD, supple, symmetrical, trachea midline, and thyroid not enlarged, symmetric, no tenderness/mass/nodules Lymph nodes: Cervical, supraclavicular, and axillary nodes normal. Resp: clear to auscultation bilaterally Back: symmetric, no curvature. ROM normal. No CVA tenderness. Cardio: regular rate and rhythm, S1, S2 normal, no murmur, click, rub or gallop GI: soft, non-tender; bowel sounds normal; no masses,  no organomegaly Extremities: extremities normal, atraumatic, no cyanosis or edema Neurologic: Alert and oriented X 3, normal strength and tone. Normal symmetric reflexes. Normal coordination and gait  ECOG PERFORMANCE STATUS: 1 - Symptomatic but completely ambulatory  Blood pressure (!) 147/86, pulse (!) 101, temperature 98.5 F (36.9 C), temperature source Oral, resp. rate 16, weight 229 lb 11.2 oz (104.2 kg), SpO2 96 %.  LABORATORY DATA: Lab Results  Component Value Date   WBC 6.7 01/01/2022   HGB 14.5 01/01/2022   HCT 42.4 01/01/2022   MCV  93.4 01/01/2022   PLT 152 01/01/2022      Chemistry      Component Value Date/Time   NA 140 01/01/2022 0943   NA 142 10/01/2016 1534   K 4.3 01/01/2022 0943   K 4.2 10/01/2016 1534   CL 108 01/01/2022 0943   CO2 28 01/01/2022 0943   CO2 28 10/01/2016 1534   BUN 19 01/01/2022 0943   BUN 15.9 10/01/2016 1534   CREATININE 1.25 (H) 01/01/2022 0943   CREATININE 1.10 02/08/2017 1101   CREATININE 1.5 (H) 10/01/2016 1534      Component Value Date/Time   CALCIUM 9.4 01/01/2022 0943   CALCIUM 9.6 10/01/2016 1534   ALKPHOS 58 01/01/2022 0943   ALKPHOS 65 10/01/2016 1534   AST 22 01/01/2022 0943   AST 20 10/01/2016 1534   ALT 24 01/01/2022 0943   ALT 24 10/01/2016 1534   BILITOT 0.5 01/01/2022 3361  BILITOT 0.46 10/01/2016 1534       RADIOGRAPHIC STUDIES: CT Chest W Contrast  Result Date: 01/02/2022 CLINICAL DATA:  Non-small cell lung cancer. Assess treatment response. * Tracking Code: BO * EXAM: CT CHEST, ABDOMEN, AND PELVIS WITH CONTRAST TECHNIQUE: Multidetector CT imaging of the chest, abdomen and pelvis was performed following the standard protocol during bolus administration of intravenous contrast. RADIATION DOSE REDUCTION: This exam was performed according to the departmental dose-optimization program which includes automated exposure control, adjustment of the mA and/or kV according to patient size and/or use of iterative reconstruction technique. CONTRAST:  142m OMNIPAQUE IOHEXOL 300 MG/ML  SOLN COMPARISON:  CT 08/28/2021 FINDINGS: CT CHEST FINDINGS Cardiovascular: Post CABG. Mediastinum/Nodes: No axillary or supraclavicular adenopathy. No mediastinal or hilar adenopathy. No pericardial fluid. Esophagus normal. Lungs/Pleura: New perihilar consolidation in the medial RIGHT middle lobe. This is adjacent to surgical clips. Air bronchograms through the consolidation. There is volume loss in the RIGHT lung consistent upper lobectomy. Small RIGHT effusion is increased from prior. Focus  of peripheral nodular pleuroparenchymal thickening in the LEFT lower lobe (image 135/series 505) is unchanged Musculoskeletal: No aggressive osseous lesion. CT ABDOMEN AND PELVIS FINDINGS Hepatobiliary: Simple fluid attenuation cyst within the RIGHT hepatic lobe. Similar lesion in the LEFT hepatic lobe. No change prior. Gallbladder normal. Pancreas: Pancreas is normal. No ductal dilatation. No pancreatic inflammation. Spleen: Normal spleen Adrenals/urinary tract: Adrenal glands and kidneys are normal. The ureters and bladder normal. Stomach/Bowel: Stomach, small bowel, appendix, and cecum are normal. The colon and rectosigmoid colon are normal. Vascular/Lymphatic: Abdominal aorta is normal caliber. There is no retroperitoneal or periportal lymphadenopathy. No pelvic lymphadenopathy. Reproductive: Prostate unremarkable Other: No peritoneal metastasis. Musculoskeletal: No aggressive osseous lesion. IMPRESSION: Chest Impression: 1. new consolidation in the medial RIGHT middle lobe with air bronchograms is favored post radiation change. Recommend close attention on routine surveillance. 2. Increased pleural fluid in the RIGHT lung following lobectomy radiation therapy. 3.  Stable pleuroparenchymal thickening at the LEFT lung base. Abdomen / Pelvis Impression: 1. No evidence of metastatic disease in the abdomen pelvis. 2. Benign hepatic and renal cysts. Electronically Signed   By: SSuzy BouchardM.D.   On: 01/02/2022 14:15   CT Abdomen Pelvis W Contrast  Result Date: 01/02/2022 CLINICAL DATA:  Non-small cell lung cancer. Assess treatment response. * Tracking Code: BO * EXAM: CT CHEST, ABDOMEN, AND PELVIS WITH CONTRAST TECHNIQUE: Multidetector CT imaging of the chest, abdomen and pelvis was performed following the standard protocol during bolus administration of intravenous contrast. RADIATION DOSE REDUCTION: This exam was performed according to the departmental dose-optimization program which includes automated  exposure control, adjustment of the mA and/or kV according to patient size and/or use of iterative reconstruction technique. CONTRAST:  1065mOMNIPAQUE IOHEXOL 300 MG/ML  SOLN COMPARISON:  CT 08/28/2021 FINDINGS: CT CHEST FINDINGS Cardiovascular: Post CABG. Mediastinum/Nodes: No axillary or supraclavicular adenopathy. No mediastinal or hilar adenopathy. No pericardial fluid. Esophagus normal. Lungs/Pleura: New perihilar consolidation in the medial RIGHT middle lobe. This is adjacent to surgical clips. Air bronchograms through the consolidation. There is volume loss in the RIGHT lung consistent upper lobectomy. Small RIGHT effusion is increased from prior. Focus of peripheral nodular pleuroparenchymal thickening in the LEFT lower lobe (image 135/series 505) is unchanged Musculoskeletal: No aggressive osseous lesion. CT ABDOMEN AND PELVIS FINDINGS Hepatobiliary: Simple fluid attenuation cyst within the RIGHT hepatic lobe. Similar lesion in the LEFT hepatic lobe. No change prior. Gallbladder normal. Pancreas: Pancreas is normal. No ductal dilatation. No pancreatic inflammation. Spleen:  Normal spleen Adrenals/urinary tract: Adrenal glands and kidneys are normal. The ureters and bladder normal. Stomach/Bowel: Stomach, small bowel, appendix, and cecum are normal. The colon and rectosigmoid colon are normal. Vascular/Lymphatic: Abdominal aorta is normal caliber. There is no retroperitoneal or periportal lymphadenopathy. No pelvic lymphadenopathy. Reproductive: Prostate unremarkable Other: No peritoneal metastasis. Musculoskeletal: No aggressive osseous lesion. IMPRESSION: Chest Impression: 1. new consolidation in the medial RIGHT middle lobe with air bronchograms is favored post radiation change. Recommend close attention on routine surveillance. 2. Increased pleural fluid in the RIGHT lung following lobectomy radiation therapy. 3.  Stable pleuroparenchymal thickening at the LEFT lung base. Abdomen / Pelvis Impression: 1.  No evidence of metastatic disease in the abdomen pelvis. 2. Benign hepatic and renal cysts. Electronically Signed   By: Suzy Bouchard M.D.   On: 01/02/2022 14:15     ASSESSMENT AND PLAN: This is a very pleasant 71  years old white male with a stage Ia non-small cell lung cancer status post right upper lobectomy with lymph node dissection on September 02, 2016. The patient he was on observation since that time. Unfortunately there are some concerning findings with nodular density in the posterior right lower lobe as well as increase and right pleural effusion suspicious for disease recurrence. He underwent ultrasound-guided right thoracentesis and the cytology of the pleural fluid was consistent with recurrent adenocarcinoma. He had molecular studies performed by [facility] 1 that showed positive EGFR mutation with deletion 6. The patient was started on treatment with Tagrisso 80 mg p.o. daily on 11/05/2017.  He status post 48 months of treatment. He recently underwent SBRT to the enlarging right lung nodule under the care of Dr. Lisbeth Renshaw. The patient continues to tolerate his treatment with Tagrisso fairly well with no concerning adverse effects. He had repeat CT scan of the chest, abdomen and pelvis performed recently.  I personally and independently reviewed the scan images and discussed the results and showed the images to the patient and his wife. His scan showed no concerning findings for disease progression but there was new consolidation in the medial right middle lobe with air bronchograms favored to be radiation changes. I recommended for the patient to continue his current treatment with Tagrisso with the same dose for now. I will see him back for follow-up visit in 2 months for evaluation with repeat blood work. For the history of back pain, he is followed by orthopedic surgery and he was treated with prednisone recently. For the skin rash she will continue to use clindamycin lotion on  as-needed basis. The patient was advised to call immediately if he has any concerning symptoms in the interval. The patient voices understanding of current disease status and treatment options and is in agreement with the current care plan. All questions were answered. The patient knows to call the clinic with any problems, questions or concerns. We can certainly see the patient much sooner if necessary.  Disclaimer: This note was dictated with voice recognition software. Similar sounding words can inadvertently be transcribed and may not be corrected upon review.

## 2022-01-22 NOTE — Progress Notes (Unsigned)
Cardiology Office Note    Date:  01/26/2022   ID:  James [name], DOB May 26, 1950, MRN [id number]  PCP:  James Lima, MD  Cardiologist:  [name]   Chief Complaint  Patient presents with   Coronary Artery Disease   Mitral Valve Prolapse    History of Present Illness:  MCIHAEL [name] is a 71 y.o. male with PMH of DM II, HLD, and severe MR. he was originally noted to have a loud murmur by his primary care physician.  Initial echocardiogram suggested mild MR, however it also suggested partially flail leaflet.  MR was thought to be underestimated.  TEE showed a flail P3 segment with ruptured cord and a severe MR.  Cardiac catheterization performed on 08/11/2016 showed 65% mid to distal LAD lesion, FFR borderline at 0.81, EF 65%.  Normal cardiac output and RV/LV filling pressure.  He had a PET scan on 08/17/2016 that showed a hypermetabolic solid 2.6 cm anterior right upper lobe pulmonary nodule compatible with prior bronchogenic, no hypermetabolic thoracic lymphadenopathy or distal metastatic disease.  There were nonspecific and mild asymmetric hypermetabolism in the left pontine tonsil and the left tongue base region.  CT surgery recommended lobectomy for his lung mass with possible CABG with [name] to LAD and MV repair.  He underwent right upper lobe lobectomy in April.  Pathology positive for adenocarcinoma stage Ia.  Serial CT scan was recommended by oncology.  Post procedure, he became very short of breath.  CTA was positive for a small PE, he was started on Xarelto.  Repeat echocardiogram obtained on 02/15/2017 showed EF 60-65%, persistent MR.  He eventually underwent mitral valve repair with Sorin Carbomedics Annuloglex size 28, [name] to LAD by Dr. Roxy [name]. He was DC on coumadin instead of Xarelto. Completed 6 months of anticoagulation for provoked PE and then it was stopped.   In August 20-19 CT showed a right pleural effusion. Thoracentesis was positive for recurrent CA. He was started on Tagrisso  by Dr. Inda [name]. He was admitted on February 06, 2018 with submassive PE. He was treated with  EKOS with bilateral intra-arterial TPA, patient completed 6 days of IV heparin. The original plan was to transition to Xarelto after 5 days of IV heparin.  However he developed left flank pain, hematuria and right leg painful swelling.  Hence IV heparin was continued.  Both Urology and OIrthopedics evaluated and cleared for transitioning to [place].  Xarelto was started on 02/12/2018.  Hematuria decreased  Right leg pain also improved. MRI did show evidence of hematoma that was improving. He had chronic DVT in right peroneal vein.  Since patient was started on Xarelto, aspirin discontinued to minimize bleeding risk.   In April he had a new nodule noted on Chest CT and had RT for this area. He was also having a lot of back problems and had a steroid taper per Ortho. This resulted in increased blood sugar and swelling. Swelling is some better. He does note some increased SOB since he had RT.  Sees Dr James [name] now for CKD. On low salt diet. Is scheduled for complete lab with Dr James [name] this fall. Has not started taking Jardiance yet. He is taking lasix 5 days a week.   Past Medical History:  Diagnosis Date   Adenocarcinoma of right lung, stage 1 ([place]) 09/06/2016   Anxiety    Arthritis    "knees, hips, back" (10/19/2012)   Chronic diastolic congestive heart failure (HCC)    Chronic lower back  pain    Colon polyps    10/27/2002, repeat letter 09/17/2007   Coronary artery disease    Coronary artery disease involving native coronary artery of native heart without angina pectoris    Depressive disorder, not elsewhere classified    no meds   Diabetes mellitus without complication ([place])    diet controlled- no med  (while in hosp 4/18 -elevated cbg   Dyspnea    Fasting hyperglycemia    GERD (gastroesophageal reflux disease)    Heart murmur    Hemoptysis    abnormal CT Chest 01/29/10 - ? new GG changes RUL > not viz on  plain cxr 02/26/2010   Hypertension    Mitral regurgitation    severe MR 08/2016   MPN (myeloproliferative neoplasm) ([place])    1st detected 06/04/1998   Obesity    OSA on CPAP    last sleep study 10 years ago   Other and unspecified hyperlipidemia    Peripheral vascular disease ([place]) 08/2016   after lung surgey small clots in lungs,after hip dvt-5/16   Pneumonia    4/18   Positive PPD 1965   "non reactive in 2012" (10/19/2012)   Routine general medical examination at a health care facility    S/P CABG x 1 03/24/2017   [name] to LAD   S/P mitral valve repair 03/24/2017   Complex valvuloplasty including artificial Gore-tex neochord placement x6 and 28 mm Sorin Annuloflex posterior annuloplasty band   Special screening for malignant neoplasm of prostate    Spinal stenosis, unspecified region other than cervical    Wrist pain, left     Past Surgical History:  Procedure Laterality Date   ANTERIOR CRUCIATE LIGAMENT REPAIR Left [place]   CHEST TUBE INSERTION Right 10/19/2012   post bronch   COLONOSCOPY W/ POLYPECTOMY     CORONARY ARTERY BYPASS GRAFT N/A 03/24/2017   Procedure: CORONARY ARTERY BYPASS GRAFTING (CABG)x1 using left internal mammary artery, [name]-LAD;  Surgeon: Rexene Alberts, MD;  Location: [facility];  Service: Open Heart Surgery;  Laterality: N/A;   FLEXIBLE BRONCHOSCOPY  10/19/2012   Flexible video fiberoptic bronchoscopy with electromagnetic navigation and biopsies.   INTRAVASCULAR PRESSURE WIRE/FFR STUDY N/A 08/11/2016   Procedure: Intravascular Pressure Wire/FFR Study;  Surgeon: Minoru Chap M Martinique, MD;  Location: [facility];  Service: Cardiovascular;  Laterality: N/A;   IR ANGIOGRAM PULMONARY BILATERAL SELECTIVE  02/06/2018   IR ANGIOGRAM SELECTIVE EACH ADDITIONAL VESSEL  02/06/2018   IR ANGIOGRAM SELECTIVE EACH ADDITIONAL VESSEL  02/06/2018   IR INFUSION THROMBOL ARTERIAL INITIAL (MS)  02/06/2018   IR INFUSION THROMBOL ARTERIAL INITIAL (MS)   02/06/2018   IR THROMB F/U EVAL ART/VEN FINAL DAY (MS)  02/07/2018   IR US GUIDE VASC ACCESS RIGHT  02/06/2018   LOBECTOMY Right 09/02/2016   Procedure: RIGHT UPPER LOBECTOMY;  Surgeon: Melrose Nakayama, MD;  Location: [facility];  Service: Thoracic;  Laterality: Right;   LYMPH NODE DISSECTION Right 09/02/2016   Procedure: LYMPH NODE DISSECTION, RIGHT LUNG;  Surgeon: Melrose Nakayama, MD;  Location: [facility];  Service: Thoracic;  Laterality: Right;   MITRAL VALVE REPAIR N/A 03/24/2017   Procedure: MITRAL VALVE REPAIR (MVR) with Sorin Carbomedics Annuloflex size 28;  Surgeon: Rexene Alberts, MD;  Location: [facility];  Service: Open Heart Surgery;  Laterality: N/A;   RIGHT/LEFT HEART CATH AND CORONARY ANGIOGRAPHY N/A 08/11/2016   Procedure: Right/Left Heart Cath and Coronary Angiography;  Surgeon: Beth Goodlin M Martinique,  MD;  Location: [facility];  Service: Cardiovascular;  Laterality: N/A;   TEE WITHOUT CARDIOVERSION N/A 07/17/2016   Procedure: TRANSESOPHAGEAL ECHOCARDIOGRAM (TEE);  Surgeon: [name], MD;  Location: [facility] ENDOSCOPY;  Service: Cardiovascular;  Laterality: N/A;   TEE WITHOUT CARDIOVERSION N/A 03/24/2017   Procedure: TRANSESOPHAGEAL ECHOCARDIOGRAM (TEE);  Surgeon: Rexene Alberts, MD;  Location: [facility];  Service: Open Heart Surgery;  Laterality: N/A;   TONSILLECTOMY  1950's   TOTAL HIP ARTHROPLASTY Left 10/05/2014   dr [name]   TOTAL HIP ARTHROPLASTY Left 10/05/2014   Procedure: LEFT TOTAL HIP ARTHROPLASTY ANTERIOR APPROACH;  Surgeon: [name], MD;  Location: [facility];  Service: Orthopedics;  Laterality: Left;   VIDEO ASSISTED THORACOSCOPY (VATS)/WEDGE RESECTION Right 09/02/2016   Procedure: VIDEO ASSISTED THORACOSCOPY (VATS)/RIGHT UPPER LOBE WEDGE RESECTION;  Surgeon: Melrose Nakayama, MD;  Location: [facility];  Service: Thoracic;  Laterality: Right;   VIDEO BRONCHOSCOPY WITH ENDOBRONCHIAL NAVIGATION N/A 10/19/2012   Procedure: VIDEO BRONCHOSCOPY WITH ENDOBRONCHIAL NAVIGATION;  Surgeon:  [name], MD;  Location: [facility];  Service: Thoracic;  Laterality: N/A;   WRIST RECONSTRUCTION Left 12/2009   'proximal row carpectomy" [name]    Current Medications: Outpatient Medications Prior to Visit  Medication Sig Dispense Refill   clindamycin (CLEOCIN-T) 1 % external solution Apply topically 2 (two) times daily. 60 mL 0   ezetimibe (ZETIA) 10 MG tablet TAKE 1 TABLET BY MOUTH EVERY DAY 90 tablet 3   furosemide (LASIX) 20 MG tablet Take 1 tablet (20 mg total) by mouth daily. 90 tablet 3   LORazepam (ATIVAN) 0.5 MG tablet 1 tablet p.o. 30 minutes before the MRI.  May repeat once if needed. 2 tablet 0   metoprolol tartrate (LOPRESSOR) 25 MG tablet TAKE 0.5 TABLETS BY MOUTH 2 TIMES DAILY. 90 tablet 3   Multiple Vitamin (MULTIVITAMIN) tablet Take 1 tablet by mouth daily.     Omega-3 Fatty Acids (FISH OIL) 500 MG CAPS Take 500 mg by mouth daily.      osimertinib mesylate (TAGRISSO) 80 MG tablet TAKE 1 TABLET (80 MG TOTAL) BY MOUTH DAILY. 30 tablet 2   XARELTO 20 MG TABS tablet TAKE 1 TABLET BY MOUTH DAILY WITH SUPPER. 90 tablet 3   empagliflozin (JARDIANCE) 10 MG TABS tablet Take 1 tablet (10 mg total) by mouth daily before breakfast. (Patient not taking: Reported on 01/26/2022) 90 tablet 0   sildenafil (REVATIO) 20 MG tablet Take 4 tablets (80 mg total) by mouth daily as needed. (Patient not taking: Reported on 09/04/2021) 60 tablet 5   No facility-administered medications prior to visit.     Allergies:   Symbicort [budesonide-formoterol fumarate] and Amoxicillin   Social History   Socioeconomic History   Marital status: Married    Spouse name: Not on file   Number of children: 3   Years of education: Not on file   Highest education level: Not on file  Occupational History   Occupation: Retired, Financial risk analyst   Occupation: Retired, Real estate    Comment: Slow  Tobacco Use   Smoking status: Never   Smokeless tobacco: Never  Vaping Use   Vaping Use: Never used  Substance  and Sexual Activity   Alcohol use: Yes    Alcohol/week: 1.0 standard drink of alcohol    Types: 1 Cans of beer per week    Comment: none since 3 weeks   Drug use: No   Sexual activity: Never    Partners: Female  Other  Topics Concern   Not on file  Social History Narrative   HSG, [employer]. Married '73.  2 sons - '74,   '76;  1 daughter -  '77  6 grandchildren.   Work - IT consultant now retired. ACP - not fully discussed.                   Social Determinants of Health   Financial Resource Strain: Not on file  Food Insecurity: Unknown (02/11/2018)   Hunger Vital Sign    Worried About Running Out of Food in the Last Year: Patient refused    [place] in the Last Year: Patient refused  Transportation Needs: Unknown (02/11/2018)   PRAPARE - Hydrologist (Medical): Patient refused    Lack of Transportation (Non-Medical): Patient refused  Physical Activity: Unknown (02/11/2018)   Exercise Vital Sign    Days of Exercise per Week: Patient refused    Minutes of Exercise per Session: Patient refused  Stress: Not on file  Social Connections: Not on file     Family History:  The patient's family history includes Heart attack in his father; Heart disease in his father and paternal uncle; Heart failure in his mother.   ROS:   Please see the history of present illness.    ROS All other systems reviewed and are negative.   PHYSICAL EXAM:   VS:  BP 118/76   Pulse 69   Ht 5\' 7"  (1.702 m)   Wt 231 lb 9.6 oz (105.1 kg)   SpO2 96%   BMI 36.27 kg/m    GENERAL:  Well appearing WM in NAD HEENT:  PERRL, EOMI, sclera are clear. Oropharynx is clear. NECK:  No jugular venous distention, carotid upstroke brisk and symmetric, no bruits, no thyromegaly or adenopathy LUNGS:  Clear to auscultation bilaterally CHEST:  Unremarkable HEART:  RRR,  PMI not displaced or sustained,S1 and S2 within normal limits, no S3, no S4: no clicks,  no rubs, gr 2/6 systolic murmur LSB ABD:  Soft, nontender. BS +, no masses or bruits. No hepatomegaly, no splenomegaly EXT:  2 + pulses throughout, 2+ pitting ankle edema, no cyanosis no clubbing SKIN:  Warm and dry.  No rashes NEURO:  Alert and oriented x 3. Cranial nerves II through XII intact. PSYCH:  Cognitively intact      Wt Readings from Last 3 Encounters:  01/26/22 231 lb 9.6 oz (105.1 kg)  01/05/22 229 lb 11.2 oz (104.2 kg)  11/03/21 236 lb (107 kg)      Studies/Labs Reviewed:   EKG:  EKG is ordered today. NSR rate 69. Poor R wave progression. No change. I have personally reviewed and interpreted this study.   Recent Labs: 03/06/2021: TSH 1.85 01/01/2022: ALT 24; BUN 19; Creatinine 1.25; Hemoglobin 14.5; Platelet Count 152; Potassium 4.3; Sodium 140   Lipid Panel    Component Value Date/Time   CHOL 196 01/22/2021 0829   TRIG 155 (H) 01/22/2021 0829   TRIG 108 05/20/2006 0825   HDL 45 01/22/2021 0829   CHOLHDL 4.4 01/22/2021 0829   CHOLHDL 4.2 12/26/2019 1150   VLDL 26.0 12/13/2018 1101   LDLCALC 123 (H) 01/22/2021 0829   LDLCALC 136 (H) 12/26/2019 1150   LDLDIRECT 177.0 07/24/2015 1052    Additional studies/ records that were reviewed today include:    Cath 08/11/2016 Conclusion     Prox LAD to Mid LAD lesion, 30 %stenosed. Mid LAD to [facility]  LAD lesion, 65 %stenosed. Prox RCA to Mid RCA lesion, 15 %stenosed. The left ventricular systolic function is normal. LV end diastolic pressure is normal. The left ventricular ejection fraction is 55-65% by visual estimate. LV end diastolic pressure is normal.   1. Borderline single vessel obstructive CAD involving the mid LAD. FFR 0.81 2. Normal LV function EF 65% 3. Normal right heart and LV filling pressures 4. Normal Cardiac output   Plan: will refer to CT surgery for consideration of MV repair. The stenosis in the LAD will be discussed. He is asymptomatic and I would favor treating it medically. If he  develops symptoms in the future it could be treated with PCI.         Echo 02/15/2017 LV EF: 60% -   65%  Study Conclusions   - Left ventricle: The cavity size was normal. Wall thickness was   normal. Systolic function was normal. The estimated ejection   fraction was in the range of 60% to 65%. - Aortic valve: AV is thickened, calcified with minimally   restricted motion. - Mitral valve: Prolapse fo the posterior mitral leaflet. MR is at   least moderate in intensity Calcified annulus. Mildly thickened   leaflets . - Left atrium: The atrium was mildly dilated.     CABG with MVR 03/24/2017 Preoperative Diagnosis:       Severe Mitral Regurgitation Single-vessel Coronary Artery Disease   Postoperative Diagnosis:    Same   Procedure:        Mitral Valve Repair             Complex valvuloplasty including artificial Gore-tex neochord placement x6             Sorin Carbomedics Annuloflex posterior annuloplasty band (size 16mm, catalog #[id number], serial #[id number])   Coronary Artery Bypass Grafting x 1              Left Internal Mammary Artery to Distal Left Anterior Descending Coronary Artery   Echo 06/10/17: Study Conclusions   - Left ventricle: The cavity size was normal. There was moderate   concentric hypertrophy. Systolic function was vigorous. The   estimated ejection fraction was in the range of 65% to 70%. Wall   motion was normal; there were no regional wall motion   abnormalities. Doppler parameters are consistent with abnormal   left ventricular relaxation (grade 1 diastolic dysfunction). - Aortic valve: Trileaflet; mildly thickened, mildly calcified   leaflets. Transvalvular velocity was minimally increased. There   was no stenosis. There was no regurgitation. - Mitral valve: S/P mitral valve repair with fixed posterior   leaflet. Transvalvular velocity was elevated. There was no   regurgitation. - Right ventricle: The cavity size was normal. Wall thickness  was   normal. Systolic function was normal. - Tricuspid valve: There was no regurgitation. - Pericardium, extracardiac: A mild pericardial effusion was   identified posterior to the heart. Features were not consistent   with tamponade physiology.   Impressions:   - Since the last study on 02/15/2017 mitral valve is post repair.   Transmitral velocities are elevated with mean gradient 8 mmHg.   LVEF is hyperdynamic.  Echo 02/07/18: Study Conclusions   - Left ventricle: The cavity size was mildly reduced. Wall   thickness was increased in a pattern of mild LVH. Systolic   function was vigorous. The estimated ejection fraction was in the   range of 65% to 70%. - Mitral valve: s/p MV repair. Motion is difficult to  see due to   poor acoustic windows Peak and mean gradiaeants through the valve   are 7 and 4 mm Hg respectively. MVA by P T1/2 is 1.75 cm2   consistent with mild MS - Right ventricle: RV is difficult to visualize, even with Definity   OVerall RVEF appears mildly reduced. The cavity size was mildly   dilated. - Right atrium: The atrium was mildly dilated.  Echo 01/29/19: IMPRESSIONS     1. Left ventricular ejection fraction, by visual estimation, is 50 to  55%. The left ventricle has normal function. Normal left ventricular size.  Left ventricular septal wall thickness was mildly increased. Mildly  increased left ventricular posterior  wall thickness. There is mildly increased left ventricular hypertrophy.   2. Elevated left ventricular end-diastolic pressure.   3. Left ventricular diastolic Doppler parameters are consistent with  pseudonormalization pattern of LV diastolic filling.   4. Global right ventricle has normal systolic function.The right  ventricular size is normal. No increase in right ventricular wall  thickness.   5. Left atrial size was normal.   6. Right atrial size was normal.   7. The mitral valve is normal in structure. Mild mitral valve   regurgitation. Mild mitral stenosis.   8. The tricuspid valve is normal in structure. Tricuspid valve  regurgitation is mild.   9. The aortic valve is normal in structure. Aortic valve regurgitation  was not visualized by color flow Doppler. Structurally normal aortic  valve, with no evidence of sclerosis or stenosis.  10. Mild thickening and calcification of the aortic valve leaflets.  11. The pulmonic valve was normal in structure. Pulmonic valve  regurgitation is trivial by color flow Doppler.  12. Normal pulmonary artery systolic pressure.  13. The inferior vena cava is normal in size with greater than 50%  respiratory variability, suggesting right atrial pressure of 3 mmHg.   ASSESSMENT:    1. S/P mitral valve repair   2. Chronic diastolic CHF (congestive heart failure) ([place])   3. Coronary artery disease involving native coronary artery of native heart without angina pectoris   4. Primary hypertension   5. Stage 3a chronic kidney disease (HCC)       PLAN:  In order of problems listed above:  Severe MR s/p mitral valve annuloplasty/repair:  F/U Echo 2020  showed an excellent repair.  Recommend routine SBE prophylaxis.  No murmur on exam  2.   History of PE now recurrent on 02/06/18. Submassive with RV strain. S/p EKOS and lytic therapy. On Xarelto now.  Will need anticoagulation indefinitely.   3.    Chronic diastolic heart failure: weight is up some and he has more pitting edema. .  He does have venous insuffieciency. Will continue lasix at current dose. I have  encouraged him to go ahead and start Jardiance.   Continue sodium restriction and support hose.   4.    S/p CABG x 1: [name] to LAD  5.    Stage IV lung CA. S/p resection with recurrent malignant pleural effusion. On oral chemotherapy.  S/p RT for recurrent lung nodule.   6.   DM 2: Managed by primary care provider- start Jardiance.  7.   HLD off statin due to history of elevated CK on Lipitor. Focusing on lifestyle  modification for now.   8. CKD stage 3a. Followed by Nephrology. Last creatinine 1.29.     Medication Adjustments/Labs and Tests Ordered: Current medicines are reviewed at length with the patient today.  Concerns regarding medicines are outlined above.  Medication changes, Labs and Tests ordered today are listed in the Patient Instructions below. There are no Patient Instructions on file for this visit.   Signed, Katura Eatherly Martinique, MD  01/26/2022 11:26 AM    [facility] [address], [place], [place]  [address] Phone: [phone]; Fax: [phone]

## 2022-01-26 ENCOUNTER — Encounter: Payer: Self-pay | Admitting: Cardiology

## 2022-01-26 ENCOUNTER — Ambulatory Visit: Payer: Medicare PPO | Attending: Cardiology | Admitting: Cardiology

## 2022-01-26 VITALS — BP 118/76 | HR 69 | Ht 67.0 in | Wt 231.6 lb

## 2022-01-26 DIAGNOSIS — I5032 Chronic diastolic (congestive) heart failure: Secondary | ICD-10-CM

## 2022-01-26 DIAGNOSIS — I2510 Atherosclerotic heart disease of native coronary artery without angina pectoris: Secondary | ICD-10-CM

## 2022-01-26 DIAGNOSIS — Z9889 Other specified postprocedural states: Secondary | ICD-10-CM | POA: Diagnosis not present

## 2022-01-26 DIAGNOSIS — I10 Essential (primary) hypertension: Secondary | ICD-10-CM

## 2022-01-26 DIAGNOSIS — N1831 Chronic kidney disease, stage 3a: Secondary | ICD-10-CM | POA: Diagnosis not present

## 2022-01-26 DIAGNOSIS — N2581 Secondary hyperparathyroidism of renal origin: Secondary | ICD-10-CM | POA: Diagnosis not present

## 2022-01-26 DIAGNOSIS — D631 Anemia in chronic kidney disease: Secondary | ICD-10-CM | POA: Diagnosis not present

## 2022-01-26 DIAGNOSIS — E1122 Type 2 diabetes mellitus with diabetic chronic kidney disease: Secondary | ICD-10-CM | POA: Diagnosis not present

## 2022-01-26 DIAGNOSIS — I129 Hypertensive chronic kidney disease with stage 1 through stage 4 chronic kidney disease, or unspecified chronic kidney disease: Secondary | ICD-10-CM | POA: Diagnosis not present

## 2022-01-29 ENCOUNTER — Other Ambulatory Visit (HOSPITAL_COMMUNITY): Payer: Self-pay

## 2022-02-04 ENCOUNTER — Other Ambulatory Visit (HOSPITAL_COMMUNITY): Payer: Self-pay

## 2022-02-05 ENCOUNTER — Other Ambulatory Visit (HOSPITAL_COMMUNITY): Payer: Self-pay

## 2022-03-03 ENCOUNTER — Inpatient Hospital Stay: Payer: Medicare PPO

## 2022-03-03 ENCOUNTER — Inpatient Hospital Stay: Payer: Medicare PPO | Attending: Internal Medicine | Admitting: Internal Medicine

## 2022-03-03 VITALS — BP 135/64 | HR 61 | Temp 98.8°F | Resp 18 | Wt 230.6 lb

## 2022-03-03 DIAGNOSIS — Z7901 Long term (current) use of anticoagulants: Secondary | ICD-10-CM | POA: Insufficient documentation

## 2022-03-03 DIAGNOSIS — I110 Hypertensive heart disease with heart failure: Secondary | ICD-10-CM | POA: Diagnosis not present

## 2022-03-03 DIAGNOSIS — Z7984 Long term (current) use of oral hypoglycemic drugs: Secondary | ICD-10-CM | POA: Diagnosis not present

## 2022-03-03 DIAGNOSIS — Z79899 Other long term (current) drug therapy: Secondary | ICD-10-CM | POA: Diagnosis not present

## 2022-03-03 DIAGNOSIS — Z86718 Personal history of other venous thrombosis and embolism: Secondary | ICD-10-CM | POA: Diagnosis not present

## 2022-03-03 DIAGNOSIS — Z902 Acquired absence of lung [part of]: Secondary | ICD-10-CM | POA: Insufficient documentation

## 2022-03-03 DIAGNOSIS — C3491 Malignant neoplasm of unspecified part of right bronchus or lung: Secondary | ICD-10-CM

## 2022-03-03 DIAGNOSIS — C3411 Malignant neoplasm of upper lobe, right bronchus or lung: Secondary | ICD-10-CM | POA: Insufficient documentation

## 2022-03-03 DIAGNOSIS — I5032 Chronic diastolic (congestive) heart failure: Secondary | ICD-10-CM | POA: Insufficient documentation

## 2022-03-03 DIAGNOSIS — C3490 Malignant neoplasm of unspecified part of unspecified bronchus or lung: Secondary | ICD-10-CM

## 2022-03-03 DIAGNOSIS — Z923 Personal history of irradiation: Secondary | ICD-10-CM | POA: Diagnosis not present

## 2022-03-03 DIAGNOSIS — R062 Wheezing: Secondary | ICD-10-CM | POA: Diagnosis not present

## 2022-03-03 LAB — CBC WITH DIFFERENTIAL (CANCER CENTER ONLY)
Abs Immature Granulocytes: 0.04 10*3/uL (ref 0.00–0.07)
Basophils Absolute: 0 10*3/uL (ref 0.0–0.1)
Basophils Relative: 1 %
Eosinophils Absolute: 0.1 10*3/uL (ref 0.0–0.5)
Eosinophils Relative: 2 %
HCT: 44.1 % (ref 39.0–52.0)
Hemoglobin: 14.9 g/dL (ref 13.0–17.0)
Immature Granulocytes: 1 %
Lymphocytes Relative: 20 %
Lymphs Abs: 1.3 10*3/uL (ref 0.7–4.0)
MCH: 31.8 pg (ref 26.0–34.0)
MCHC: 33.8 g/dL (ref 30.0–36.0)
MCV: 94.2 fL (ref 80.0–100.0)
Monocytes Absolute: 0.8 10*3/uL (ref 0.1–1.0)
Monocytes Relative: 12 %
Neutro Abs: 4.3 10*3/uL (ref 1.7–7.7)
Neutrophils Relative %: 64 %
Platelet Count: 148 10*3/uL — ABNORMAL LOW (ref 150–400)
RBC: 4.68 MIL/uL (ref 4.22–5.81)
RDW: 14.8 % (ref 11.5–15.5)
WBC Count: 6.6 10*3/uL (ref 4.0–10.5)
nRBC: 0 % (ref 0.0–0.2)

## 2022-03-03 LAB — CMP (CANCER CENTER ONLY)
ALT: 22 U/L (ref 0–44)
AST: 22 U/L (ref 15–41)
Albumin: 4 g/dL (ref 3.5–5.0)
Alkaline Phosphatase: 52 U/L (ref 38–126)
Anion gap: 6 (ref 5–15)
BUN: 23 mg/dL (ref 8–23)
CO2: 26 mmol/L (ref 22–32)
Calcium: 9.1 mg/dL (ref 8.9–10.3)
Chloride: 108 mmol/L (ref 98–111)
Creatinine: 1.39 mg/dL — ABNORMAL HIGH (ref 0.61–1.24)
GFR, Estimated: 54 mL/min — ABNORMAL LOW (ref >60)
Glucose, Bld: 141 mg/dL — ABNORMAL HIGH (ref 70–99)
Potassium: 4.2 mmol/L (ref 3.5–5.1)
Sodium: 140 mmol/L (ref 135–145)
Total Bilirubin: 0.4 mg/dL (ref 0.3–1.2)
Total Protein: 6.7 g/dL (ref 6.5–8.1)

## 2022-03-03 NOTE — Progress Notes (Signed)
[facility] [phone]   [phone]) [phone]  OFFICE PROGRESS NOTE  [name], MD [place] [address]  DIAGNOSIS: Recurrent non-small cell lung cancer, adenocarcinoma initially diagnosed as stage IA (T1c, N0, M0) non-small cell lung cancer, adenocarcinoma presented with right upper lobe lung nodule in April 2018 with recurrence in June 2019.  Biomarker Findings Microsatellite status - MS-Stable Tumor Mutational Burden - TMB-Low (3 Muts/Mb) Genomic Findings For a complete list of the genes assayed, please refer to the Appendix. EGFR exon 19 deletion ([id number]>M) CDKN2A/B loss NKX2-1 amplification 7 Disease relevant genes with no reportable alterations: KRAS, ALK, BRAF, MET, RET, ERBB2, ROS1   PRIOR THERAPY:  1) Status post right upper lobectomy with lymph node dissection under the care of Dr. [name] on 09/02/2016. 2) status post palliative radiotherapy to enlarging right upper lobe lung nodule under the care of Dr. Lisbeth Renshaw completed on Oct 07, 2021.  CURRENT THERAPY: Tagrisso 80 mg p.o. daily.  First dose started November 05, 2017.  Status post 50 months of treatment.  INTERVAL HISTORY: James [name] 71 y.o. male returns to the clinic today for follow-up visit accompanied by his wife.  The patient is feeling fine today with no concerning complaints except for the persistent back pain as well as shortness of breath with exertion.  He also has some wheezing.  He denied having any current chest pain, cough or hemoptysis.  He has no nausea, vomiting, diarrhea or constipation.  He has no headache or visual changes.  He has no recent weight loss or night sweats.  He continues to tolerate his treatment with Tagrisso fairly well.  The patient is here today for evaluation and repeat blood work.  MEDICAL HISTORY: Past Medical History:  Diagnosis Date   Adenocarcinoma of right lung, stage 1 ([facility]) 09/06/2016   Anxiety    Arthritis     "knees, hips, back" (10/19/2012)   Chronic diastolic congestive heart failure (HCC)    Chronic lower back pain    Colon polyps    10/27/2002, repeat letter 09/17/2007   Coronary artery disease    Coronary artery disease involving native coronary artery of native heart without angina pectoris    Depressive disorder, not elsewhere classified    no meds   Diabetes mellitus without complication ([facility])    diet controlled- no med  (while in hosp 4/18 -elevated cbg   Dyspnea    Fasting hyperglycemia    GERD (gastroesophageal reflux disease)    Heart murmur    Hemoptysis    abnormal CT Chest 01/29/10 - ? new GG changes RUL > not viz on plain cxr 02/26/2010   Hypertension    Mitral regurgitation    severe MR 08/2016   MPN (myeloproliferative neoplasm) ([facility])    1st detected 06/04/1998   Obesity    OSA on CPAP    last sleep study 10 years ago   Other and unspecified hyperlipidemia    Peripheral vascular disease ([facility]) 08/2016   after lung surgey small clots in lungs,after hip dvt-5/16   Pneumonia    4/18   Positive PPD 1965   "non reactive in 2012" (10/19/2012)   Routine general medical examination at a health care facility    S/P CABG x 1 03/24/2017   LIMA to LAD   S/P mitral valve repair 03/24/2017   Complex valvuloplasty including artificial Gore-tex neochord placement x6 and 28 mm Sorin Annuloflex posterior annuloplasty band   Special  screening for malignant neoplasm of prostate    Spinal stenosis, unspecified region other than cervical    Wrist pain, left     ALLERGIES:  is allergic to symbicort [budesonide-formoterol fumarate] and amoxicillin.  MEDICATIONS:  Current Outpatient Medications  Medication Sig Dispense Refill   clindamycin (CLEOCIN-T) 1 % external solution Apply topically 2 (two) times daily. 60 mL 0   empagliflozin (JARDIANCE) 10 MG TABS tablet Take 1 tablet (10 mg total) by mouth daily before breakfast. (Patient not taking: Reported on 01/26/2022) 90 tablet 0   ezetimibe  (ZETIA) 10 MG tablet TAKE 1 TABLET BY MOUTH EVERY DAY 90 tablet 3   furosemide (LASIX) 20 MG tablet Take 1 tablet (20 mg total) by mouth daily. 90 tablet 3   LORazepam (ATIVAN) 0.5 MG tablet 1 tablet p.o. 30 minutes before the MRI.  May repeat once if needed. 2 tablet 0   metoprolol tartrate (LOPRESSOR) 25 MG tablet TAKE 0.5 TABLETS BY MOUTH 2 TIMES DAILY. 90 tablet 3   Multiple Vitamin (MULTIVITAMIN) tablet Take 1 tablet by mouth daily.     Omega-3 Fatty Acids (FISH OIL) 500 MG CAPS Take 500 mg by mouth daily.      osimertinib mesylate (TAGRISSO) 80 MG tablet TAKE 1 TABLET (80 MG TOTAL) BY MOUTH DAILY. 30 tablet 2   sildenafil (REVATIO) 20 MG tablet Take 4 tablets (80 mg total) by mouth daily as needed. (Patient not taking: Reported on 09/04/2021) 60 tablet 5   XARELTO 20 MG TABS tablet TAKE 1 TABLET BY MOUTH DAILY WITH SUPPER. 90 tablet 3   No current facility-administered medications for this visit.    SURGICAL HISTORY:  Past Surgical History:  Procedure Laterality Date   ANTERIOR CRUCIATE LIGAMENT REPAIR Left 1967   CARDIAC CATHETERIZATION  2000   CHEST TUBE INSERTION Right 10/19/2012   post bronch   COLONOSCOPY W/ POLYPECTOMY     CORONARY ARTERY BYPASS GRAFT N/A 03/24/2017   Procedure: CORONARY ARTERY BYPASS GRAFTING (CABG)x1 using left internal mammary artery, LIMA-LAD;  Surgeon: Rexene Alberts, MD;  Location: [facility];  Service: Open Heart Surgery;  Laterality: N/A;   FLEXIBLE BRONCHOSCOPY  10/19/2012   Flexible video fiberoptic bronchoscopy with electromagnetic navigation and biopsies.   INTRAVASCULAR PRESSURE WIRE/FFR STUDY N/A 08/11/2016   Procedure: Intravascular Pressure Wire/FFR Study;  Surgeon: [name], MD;  Location: [facility];  Service: Cardiovascular;  Laterality: N/A;   IR ANGIOGRAM PULMONARY BILATERAL SELECTIVE  02/06/2018   IR ANGIOGRAM SELECTIVE EACH ADDITIONAL VESSEL  02/06/2018   IR ANGIOGRAM SELECTIVE EACH ADDITIONAL VESSEL  02/06/2018   IR INFUSION  THROMBOL ARTERIAL INITIAL (MS)  02/06/2018   IR INFUSION THROMBOL ARTERIAL INITIAL (MS)  02/06/2018   IR THROMB F/U EVAL ART/VEN FINAL DAY (MS)  02/07/2018   IR US GUIDE VASC ACCESS RIGHT  02/06/2018   LOBECTOMY Right 09/02/2016   Procedure: RIGHT UPPER LOBECTOMY;  Surgeon: [place] Nakayama, MD;  Location: [facility];  Service: Thoracic;  Laterality: Right;   LYMPH NODE DISSECTION Right 09/02/2016   Procedure: LYMPH NODE DISSECTION, RIGHT LUNG;  Surgeon: [place] Nakayama, MD;  Location: [facility];  Service: Thoracic;  Laterality: Right;   MITRAL VALVE REPAIR N/A 03/24/2017   Procedure: MITRAL VALVE REPAIR (MVR) with Sorin [employer]omedics Annuloflex size 28;  Surgeon: Rexene Alberts, MD;  Location: [facility];  Service: Open Heart Surgery;  Laterality: N/A;   RIGHT/LEFT HEART CATH AND CORONARY ANGIOGRAPHY N/A 08/11/2016   Procedure: Right/Left Heart Cath and Coronary  Angiography;  Surgeon: [name], MD;  Location: [facility];  Service: Cardiovascular;  Laterality: N/A;   TEE WITHOUT CARDIOVERSION N/A 07/17/2016   Procedure: TRANSESOPHAGEAL ECHOCARDIOGRAM (TEE);  Surgeon: [name], MD;  [facility];  Service: Cardiovascular;  Laterality: N/A;   TEE WITHOUT CARDIOVERSION N/A 03/24/2017   Procedure: TRANSESOPHAGEAL ECHOCARDIOGRAM (TEE);  Surgeon: Rexene Alberts, MD;  Location: [facility];  Service: Open Heart Surgery;  Laterality: N/A;   TONSILLECTOMY  1950's   TOTAL HIP ARTHROPLASTY Left 10/05/2014   dr [name]   TOTAL HIP ARTHROPLASTY Left 10/05/2014   Procedure: LEFT TOTAL HIP ARTHROPLASTY ANTERIOR APPROACH;  Surgeon: [name], MD;  Location: [facility];  Service: Orthopedics;  Laterality: Left;   VIDEO ASSISTED THORACOSCOPY (VATS)/WEDGE RESECTION Right 09/02/2016   Procedure: VIDEO ASSISTED THORACOSCOPY (VATS)/RIGHT UPPER LOBE WEDGE RESECTION;  Surgeon: [place] Nakayama, MD;  Location: [facility];  Service: Thoracic;  Laterality: Right;   VIDEO BRONCHOSCOPY WITH ENDOBRONCHIAL NAVIGATION  N/A 10/19/2012   Procedure: VIDEO BRONCHOSCOPY WITH ENDOBRONCHIAL NAVIGATION;  Surgeon: [name], MD;  Location: [facility];  Service: Thoracic;  Laterality: N/A;   WRIST RECONSTRUCTION Left 12/2009   'proximal row carpectomy" [name]    REVIEW OF SYSTEMS:  A comprehensive review of systems was negative except for: Respiratory: positive for dyspnea on exertion and wheezing Musculoskeletal: positive for back pain   PHYSICAL EXAMINATION: General appearance: alert, cooperative, fatigued, and no distress Head: Normocephalic, without obvious abnormality, atraumatic Neck: no adenopathy, no JVD, supple, symmetrical, trachea midline, and thyroid not enlarged, symmetric, no tenderness/mass/nodules Lymph nodes: Cervical, supraclavicular, and axillary nodes normal. Resp: wheezes RUL Back: symmetric, no curvature. ROM normal. No CVA tenderness. Cardio: regular rate and rhythm, S1, S2 normal, no murmur, click, rub or gallop GI: soft, non-tender; bowel sounds normal; no masses,  no organomegaly Extremities: extremities normal, atraumatic, no cyanosis or edema  ECOG PERFORMANCE STATUS: 1 - Symptomatic but completely ambulatory  Blood pressure 135/64, pulse 61, temperature 98.8 F (37.1 C), temperature source Oral, resp. rate 18, weight 230 lb 9.6 oz (104.6 kg), SpO2 96 %.  LABORATORY DATA: Lab Results  Component Value Date   WBC 6.7 01/01/2022   HGB 14.5 01/01/2022   HCT 42.4 01/01/2022   MCV 93.4 01/01/2022   PLT 152 01/01/2022      Chemistry      Component Value Date/Time   NA 140 01/01/2022 0943   NA 142 10/01/2016 1534   K 4.3 01/01/2022 0943   K 4.2 10/01/2016 1534   CL 108 01/01/2022 0943   CO2 28 01/01/2022 0943   CO2 28 10/01/2016 1534   BUN 19 01/01/2022 0943   BUN 15.9 10/01/2016 1534   CREATININE 1.25 (H) 01/01/2022 0943   CREATININE 1.10 02/08/2017 1101   CREATININE 1.5 (H) 10/01/2016 1534      Component Value Date/Time   CALCIUM 9.4 01/01/2022 0943   CALCIUM 9.6  10/01/2016 1534   ALKPHOS 58 01/01/2022 0943   ALKPHOS 65 10/01/2016 1534   AST 22 01/01/2022 0943   AST 20 10/01/2016 1534   ALT 24 01/01/2022 0943   ALT 24 10/01/2016 1534   BILITOT 0.5 01/01/2022 0943   BILITOT 0.46 10/01/2016 1534       RADIOGRAPHIC STUDIES: No results found.   ASSESSMENT AND PLAN: This is a very pleasant 71  years old white male with a stage Ia non-small cell lung cancer status post right upper lobectomy with lymph node dissection on September 02, 2016.  The patient he was on observation since that time. Unfortunately there are some concerning findings with nodular density in the posterior right lower lobe as well as increase and right pleural effusion suspicious for disease recurrence. He underwent ultrasound-guided right thoracentesis and the cytology of the pleural fluid was consistent with recurrent adenocarcinoma. He had molecular studies performed by [facility] 1 that showed positive EGFR mutation with deletion 56. The patient was started on treatment with Tagrisso 80 mg p.o. daily on 11/05/2017.  He status post 50 months of treatment. He recently underwent SBRT to the enlarging right lung nodule under the care of Dr. Lisbeth Renshaw. The patient continues to tolerate his treatment with Tagrisso fairly well with no concerning adverse effects. I recommended for him to continue on the same treatment regimen for now. For the wheezing, I advised him to reach out to his pulmonologist for evaluation and consideration of inhaler treatment if needed. I will see him back for follow-up visit in 2 months for evaluation and repeat CT scan of the chest, abdomen and pelvis for restaging of his disease. The patient was advised to call immediately if he has any other concerning symptoms in the interval. The patient voices understanding of current disease status and treatment options and is in agreement with the current care plan. All questions were answered. The patient knows to call the  clinic with any problems, questions or concerns. We can certainly see the patient much sooner if necessary.  Disclaimer: This note was dictated with voice recognition software. Similar sounding words can inadvertently be transcribed and may not be corrected upon review.

## 2022-03-04 ENCOUNTER — Other Ambulatory Visit (HOSPITAL_COMMUNITY): Payer: Self-pay

## 2022-03-09 ENCOUNTER — Other Ambulatory Visit (HOSPITAL_COMMUNITY): Payer: Self-pay

## 2022-03-18 ENCOUNTER — Encounter: Payer: Medicare PPO | Admitting: Internal Medicine

## 2022-03-18 ENCOUNTER — Encounter: Payer: Self-pay | Admitting: Internal Medicine

## 2022-03-18 ENCOUNTER — Ambulatory Visit (INDEPENDENT_AMBULATORY_CARE_PROVIDER_SITE_OTHER): Payer: Medicare PPO | Admitting: Internal Medicine

## 2022-03-18 VITALS — BP 138/78 | HR 78 | Temp 98.8°F | Resp 16 | Ht 67.0 in | Wt 233.0 lb

## 2022-03-18 DIAGNOSIS — Z23 Encounter for immunization: Secondary | ICD-10-CM | POA: Diagnosis not present

## 2022-03-18 DIAGNOSIS — D696 Thrombocytopenia, unspecified: Secondary | ICD-10-CM

## 2022-03-18 DIAGNOSIS — E118 Type 2 diabetes mellitus with unspecified complications: Secondary | ICD-10-CM | POA: Diagnosis not present

## 2022-03-18 DIAGNOSIS — I5032 Chronic diastolic (congestive) heart failure: Secondary | ICD-10-CM

## 2022-03-18 DIAGNOSIS — Z0001 Encounter for general adult medical examination with abnormal findings: Secondary | ICD-10-CM

## 2022-03-18 DIAGNOSIS — M48061 Spinal stenosis, lumbar region without neurogenic claudication: Secondary | ICD-10-CM

## 2022-03-18 DIAGNOSIS — N401 Enlarged prostate with lower urinary tract symptoms: Secondary | ICD-10-CM | POA: Diagnosis not present

## 2022-03-18 DIAGNOSIS — N138 Other obstructive and reflux uropathy: Secondary | ICD-10-CM | POA: Diagnosis not present

## 2022-03-18 DIAGNOSIS — G720 Drug-induced myopathy: Secondary | ICD-10-CM

## 2022-03-18 DIAGNOSIS — N1831 Chronic kidney disease, stage 3a: Secondary | ICD-10-CM | POA: Diagnosis not present

## 2022-03-18 DIAGNOSIS — Z0000 Encounter for general adult medical examination without abnormal findings: Secondary | ICD-10-CM

## 2022-03-18 DIAGNOSIS — I110 Hypertensive heart disease with heart failure: Secondary | ICD-10-CM

## 2022-03-18 DIAGNOSIS — E785 Hyperlipidemia, unspecified: Secondary | ICD-10-CM

## 2022-03-18 DIAGNOSIS — T466X5A Adverse effect of antihyperlipidemic and antiarteriosclerotic drugs, initial encounter: Secondary | ICD-10-CM

## 2022-03-18 LAB — LIPID PANEL
Cholesterol: 216 mg/dL — ABNORMAL HIGH (ref 0–200)
HDL: 58.4 mg/dL (ref >39.00)
LDL Cholesterol: 139 mg/dL — ABNORMAL HIGH (ref 0–99)
NonHDL: 157.92
Total CHOL/HDL Ratio: 4
Triglycerides: 97 mg/dL (ref 0.0–149.0)
VLDL: 19.4 mg/dL (ref 0.0–40.0)

## 2022-03-18 LAB — PSA: PSA: 2.11 ng/mL (ref 0.10–4.00)

## 2022-03-18 LAB — BRAIN NATRIURETIC PEPTIDE: Pro B Natriuretic peptide (BNP): 77 pg/mL (ref 0.0–100.0)

## 2022-03-18 LAB — HEMOGLOBIN A1C: Hgb A1c MFr Bld: 6.6 % — ABNORMAL HIGH (ref 4.6–6.5)

## 2022-03-18 LAB — TROPONIN I (HIGH SENSITIVITY): High Sens Troponin I: 6 ng/L (ref 2–17)

## 2022-03-18 LAB — TSH: TSH: 2.39 u[IU]/mL (ref 0.35–5.50)

## 2022-03-18 MED ORDER — METHOCARBAMOL 500 MG PO TABS: 500.0000 mg | ORAL_TABLET | Freq: Three times a day (TID) | ORAL | 1 refills | Status: DC | PRN

## 2022-03-18 MED ORDER — EMPAGLIFLOZIN 10 MG PO TABS: 10.0000 mg | ORAL_TABLET | Freq: Every day | ORAL | 1 refills | Status: DC

## 2022-03-18 NOTE — Progress Notes (Signed)
Subjective:  Patient ID: James [name], male    DOB: 01-15-1951  Age: 71 y.o. MRN: [id number]  CC: Annual Exam, Congestive Heart Failure, Hypertension, Hyperlipidemia, and Diabetes   HPI PINCHOS TOPEL presents for a CPX and f/up -   He complains of a 2 week history of worsening shortness of breath and lower extremity edema.  He has gained 3 pounds.  He is not very active.  He requests a muscle relaxer for low back pain.  He was recently prescribed a course of prednisone.  He denies chest pain, diaphoresis, or palpitations.  Outpatient Medications Prior to Visit  Medication Sig Dispense Refill   clindamycin (CLEOCIN-T) 1 % external solution Apply topically 2 (two) times daily. 60 mL 0   ezetimibe (ZETIA) 10 MG tablet TAKE 1 TABLET BY MOUTH EVERY DAY 90 tablet 3   furosemide (LASIX) 20 MG tablet Take 1 tablet (20 mg total) by mouth daily. 90 tablet 3   LORazepam (ATIVAN) 0.5 MG tablet 1 tablet p.o. 30 minutes before the MRI.  May repeat once if needed. 2 tablet 0   metoprolol tartrate (LOPRESSOR) 25 MG tablet TAKE 0.5 TABLETS BY MOUTH 2 TIMES DAILY. 90 tablet 3   Multiple Vitamin (MULTIVITAMIN) tablet Take 1 tablet by mouth daily.     Omega-3 Fatty Acids (FISH OIL) 500 MG CAPS Take 500 mg by mouth daily.      osimertinib mesylate (TAGRISSO) 80 MG tablet TAKE 1 TABLET (80 MG TOTAL) BY MOUTH DAILY. 30 tablet 2   sildenafil (REVATIO) 20 MG tablet Take 4 tablets (80 mg total) by mouth daily as needed. 60 tablet 5   XARELTO 20 MG TABS tablet TAKE 1 TABLET BY MOUTH DAILY WITH SUPPER. 90 tablet 3   empagliflozin (JARDIANCE) 10 MG TABS tablet Take 1 tablet (10 mg total) by mouth daily before breakfast. 90 tablet 0   No facility-administered medications prior to visit.    ROS Review of Systems  Constitutional:  Positive for unexpected weight change. Negative for appetite change, chills, diaphoresis and fatigue.  HENT: Negative.    Eyes: Negative.   Respiratory:  Positive for shortness of  breath. Negative for cough, chest tightness and wheezing.   Cardiovascular:  Positive for leg swelling. Negative for chest pain and palpitations.  Gastrointestinal:  Negative for abdominal pain, constipation, diarrhea, nausea and vomiting.  Endocrine: Negative.   Genitourinary: Negative.  Negative for difficulty urinating.  Musculoskeletal:  Positive for back pain. Negative for arthralgias and myalgias.  Skin: Negative.   Neurological: Negative.  Negative for dizziness, weakness and light-headedness.  Hematological:  Negative for adenopathy. Does not bruise/bleed easily.  Psychiatric/Behavioral: Negative.      Objective:  BP 138/78 (BP Location: Left Arm, Patient Position: Sitting, Cuff Size: Large)   Pulse 78   Temp 98.8 F (37.1 C) (Oral)   Resp 16   Ht 5\' 7"  (1.702 m)   Wt 233 lb (105.7 kg)   SpO2 94%   BMI 36.49 kg/m   BP Readings from Last 3 Encounters:  03/18/22 138/78  03/03/22 135/64  01/26/22 118/76    Wt Readings from Last 3 Encounters:  03/18/22 233 lb (105.7 kg)  03/03/22 230 lb 9.6 oz (104.6 kg)  01/26/22 231 lb 9.6 oz (105.1 kg)    Physical Exam Vitals reviewed.  HENT:     Mouth/Throat:     Mouth: Mucous membranes are moist.  Eyes:     General: No scleral icterus.    Conjunctiva/sclera: Conjunctivae normal.  Cardiovascular:     Rate and Rhythm: Normal rate and regular rhythm.     Heart sounds: Murmur heard.     Systolic murmur is present with a grade of 3/6.     No diastolic murmur is present.     No friction rub. No gallop.  Pulmonary:     Effort: Pulmonary effort is normal.     Breath sounds: No stridor. No wheezing, rhonchi or rales.  Abdominal:     General: Abdomen is protuberant. There is no distension.     Palpations: There is no mass.     Tenderness: There is no abdominal tenderness. There is no guarding.     Hernia: No hernia is present.  Genitourinary:    Comments: He was not able to position himself for GU or rectal  exam. Musculoskeletal:        General: No swelling. Normal range of motion.     Cervical back: Neck supple.     Right lower leg: 2+ Pitting Edema present.     Left lower leg: 2+ Pitting Edema present.  Lymphadenopathy:     Cervical: No cervical adenopathy.  Skin:    General: Skin is warm and dry.  Neurological:     General: No focal deficit present.     Mental Status: He is alert.  Psychiatric:        Mood and Affect: Mood normal.        Behavior: Behavior normal.     Lab Results  Component Value Date   WBC 6.6 03/03/2022   HGB 14.9 03/03/2022   HCT 44.1 03/03/2022   PLT 148 (L) 03/03/2022   GLUCOSE 141 (H) 03/03/2022   CHOL 216 (H) 03/18/2022   TRIG 97.0 03/18/2022   HDL 58.40 03/18/2022   LDLDIRECT 177.0 07/24/2015   LDLCALC 139 (H) 03/18/2022   ALT 22 03/03/2022   AST 22 03/03/2022   NA 140 03/03/2022   K 4.2 03/03/2022   CL 108 03/03/2022   CREATININE 1.39 (H) 03/03/2022   BUN 23 03/03/2022   CO2 26 03/03/2022   TSH 2.39 03/18/2022   PSA 2.11 03/18/2022   INR 1.28 02/07/2018   HGBA1C 6.6 (H) 03/18/2022   MICROALBUR <0.7 03/06/2021    CT Chest W Contrast  Result Date: 01/02/2022 CLINICAL DATA:  Non-small cell lung cancer. Assess treatment response. * Tracking Code: BO * EXAM: CT CHEST, ABDOMEN, AND PELVIS WITH CONTRAST TECHNIQUE: Multidetector CT imaging of the chest, abdomen and pelvis was performed following the standard protocol during bolus administration of intravenous contrast. RADIATION DOSE REDUCTION: This exam was performed according to the departmental dose-optimization program which includes automated exposure control, adjustment of the mA and/or kV according to patient size and/or use of iterative reconstruction technique. CONTRAST:  141mL OMNIPAQUE IOHEXOL 300 MG/ML  SOLN COMPARISON:  CT 08/28/2021 FINDINGS: CT CHEST FINDINGS Cardiovascular: Post CABG. Mediastinum/Nodes: No axillary or supraclavicular adenopathy. No mediastinal or hilar adenopathy. No  pericardial fluid. Esophagus normal. Lungs/Pleura: New perihilar consolidation in the medial RIGHT middle lobe. This is adjacent to surgical clips. Air bronchograms through the consolidation. There is volume loss in the RIGHT lung consistent upper lobectomy. Small RIGHT effusion is increased from prior. Focus of peripheral nodular pleuroparenchymal thickening in the LEFT lower lobe (image 135/series 505) is unchanged Musculoskeletal: No aggressive osseous lesion. CT ABDOMEN AND PELVIS FINDINGS Hepatobiliary: Simple fluid attenuation cyst within the RIGHT hepatic lobe. Similar lesion in the LEFT hepatic lobe. No change prior. Gallbladder normal. Pancreas: Pancreas is normal.  No ductal dilatation. No pancreatic inflammation. Spleen: Normal spleen Adrenals/urinary tract: Adrenal glands and kidneys are normal. The ureters and bladder normal. Stomach/Bowel: Stomach, small bowel, appendix, and cecum are normal. The colon and rectosigmoid colon are normal. Vascular/Lymphatic: Abdominal aorta is normal caliber. There is no retroperitoneal or periportal lymphadenopathy. No pelvic lymphadenopathy. Reproductive: Prostate unremarkable Other: No peritoneal metastasis. Musculoskeletal: No aggressive osseous lesion. IMPRESSION: Chest Impression: 1. new consolidation in the medial RIGHT middle lobe with air bronchograms is favored post radiation change. Recommend close attention on routine surveillance. 2. Increased pleural fluid in the RIGHT lung following lobectomy radiation therapy. 3.  Stable pleuroparenchymal thickening at the LEFT lung base. Abdomen / Pelvis Impression: 1. No evidence of metastatic disease in the abdomen pelvis. 2. Benign hepatic and renal cysts. Electronically Signed   By: Suzy Bouchard M.D.   On: 01/02/2022 14:15   CT Abdomen Pelvis W Contrast  Result Date: 01/02/2022 CLINICAL DATA:  Non-small cell lung cancer. Assess treatment response. * Tracking Code: BO * EXAM: CT CHEST, ABDOMEN, AND PELVIS WITH  CONTRAST TECHNIQUE: Multidetector CT imaging of the chest, abdomen and pelvis was performed following the standard protocol during bolus administration of intravenous contrast. RADIATION DOSE REDUCTION: This exam was performed according to the departmental dose-optimization program which includes automated exposure control, adjustment of the mA and/or kV according to patient size and/or use of iterative reconstruction technique. CONTRAST:  187mL OMNIPAQUE IOHEXOL 300 MG/ML  SOLN COMPARISON:  CT 08/28/2021 FINDINGS: CT CHEST FINDINGS Cardiovascular: Post CABG. Mediastinum/Nodes: No axillary or supraclavicular adenopathy. No mediastinal or hilar adenopathy. No pericardial fluid. Esophagus normal. Lungs/Pleura: New perihilar consolidation in the medial RIGHT middle lobe. This is adjacent to surgical clips. Air bronchograms through the consolidation. There is volume loss in the RIGHT lung consistent upper lobectomy. Small RIGHT effusion is increased from prior. Focus of peripheral nodular pleuroparenchymal thickening in the LEFT lower lobe (image 135/series 505) is unchanged Musculoskeletal: No aggressive osseous lesion. CT ABDOMEN AND PELVIS FINDINGS Hepatobiliary: Simple fluid attenuation cyst within the RIGHT hepatic lobe. Similar lesion in the LEFT hepatic lobe. No change prior. Gallbladder normal. Pancreas: Pancreas is normal. No ductal dilatation. No pancreatic inflammation. Spleen: Normal spleen Adrenals/urinary tract: Adrenal glands and kidneys are normal. The ureters and bladder normal. Stomach/Bowel: Stomach, small bowel, appendix, and cecum are normal. The colon and rectosigmoid colon are normal. Vascular/Lymphatic: Abdominal aorta is normal caliber. There is no retroperitoneal or periportal lymphadenopathy. No pelvic lymphadenopathy. Reproductive: Prostate unremarkable Other: No peritoneal metastasis. Musculoskeletal: No aggressive osseous lesion. IMPRESSION: Chest Impression: 1. new consolidation in the  medial RIGHT middle lobe with air bronchograms is favored post radiation change. Recommend close attention on routine surveillance. 2. Increased pleural fluid in the RIGHT lung following lobectomy radiation therapy. 3.  Stable pleuroparenchymal thickening at the LEFT lung base. Abdomen / Pelvis Impression: 1. No evidence of metastatic disease in the abdomen pelvis. 2. Benign hepatic and renal cysts. Electronically Signed   By: Suzy Bouchard M.D.   On: 01/02/2022 14:15    Assessment & Plan:   Paden was seen today for annual exam, congestive heart failure, hypertension, hyperlipidemia and diabetes.  Diagnoses and all orders for this visit:  LVH (left ventricular hypertrophy) due to hypertensive disease, with heart failure ([place])- His blood pressure is adequately well controlled.  Type II diabetes mellitus with manifestations ([place])- His blood sugar is adequately well controlled. -     empagliflozin (JARDIANCE) 10 MG TABS tablet; Take 1 tablet (10 mg total) by mouth daily  before breakfast. -     Hemoglobin A1c; Future -     HM Diabetes Foot Exam -     Hemoglobin W8G  Chronic diastolic congestive heart failure ([place])- The edema is consistent with the recent course of steroids.  His troponin and BNP are reassuring. -     empagliflozin (JARDIANCE) 10 MG TABS tablet; Take 1 tablet (10 mg total) by mouth daily before breakfast. -     Brain natriuretic peptide; Future -     Troponin I (High Sensitivity); Future -     Troponin I (High Sensitivity) -     Brain natriuretic peptide  Stage 3a chronic kidney disease ([place])- Renal function is stable. -     empagliflozin (JARDIANCE) 10 MG TABS tablet; Take 1 tablet (10 mg total) by mouth daily before breakfast. -     Urinalysis, Routine w reflex microscopic; Future -     Urinalysis, Routine w reflex microscopic  BPH with obstruction/lower urinary tract symptoms -     PSA; Future -     Urinalysis, Routine w reflex microscopic; Future -     Urinalysis,  Routine w reflex microscopic -     PSA  Thrombocytopenia ([place])- Platelets are stable.  There is no history of bleeding or bruising.  Flu vaccine need -     Flu Vaccine QUAD High Dose(Fluad)  Encounter for general adult medical examination with abnormal findings- Exam completed, labs reviewed, vaccines reviewed and updated, cancer screenings are up-to-date, patient education was given.  Hyperlipidemia with target LDL less than 130- He is not willing to take a statin. -     Lipid panel; Future -     TSH; Future -     TSH -     Lipid panel  Spinal stenosis of lumbar region without neurogenic claudication -     methocarbamol (ROBAXIN) 500 MG tablet; Take 1 tablet (500 mg total) by mouth every 8 (eight) hours as needed for muscle spasms.  Statin myopathy  Other orders -     Pneumococcal polysaccharide vaccine 23-valent greater than or equal to 2yo subcutaneous/IM   I am having Braeson A. Gass "[name]" start on methocarbamol. I am also having him maintain his Fish Oil, sildenafil, multivitamin, furosemide, LORazepam, clindamycin, ezetimibe, metoprolol tartrate, Xarelto, osimertinib mesylate, and empagliflozin.  Meds ordered this encounter  Medications   empagliflozin (JARDIANCE) 10 MG TABS tablet    Sig: Take 1 tablet (10 mg total) by mouth daily before breakfast.    Dispense:  90 tablet    Refill:  1   methocarbamol (ROBAXIN) 500 MG tablet    Sig: Take 1 tablet (500 mg total) by mouth every 8 (eight) hours as needed for muscle spasms.    Dispense:  270 tablet    Refill:  1     Follow-up: Return in about 6 months (around 09/16/2022).  [name], MD

## 2022-03-18 NOTE — Patient Instructions (Signed)
Health Maintenance, Male Adopting a healthy lifestyle and getting preventive care are important in promoting health and wellness. Ask your health care provider about: The right schedule for you to have regular tests and exams. Things you can do on your own to prevent diseases and keep yourself healthy. What should I know about diet, weight, and exercise? Eat a healthy diet  Eat a diet that includes plenty of vegetables, fruits, low-fat dairy products, and lean protein. Do not eat a lot of foods that are high in solid fats, added sugars, or sodium. Maintain a healthy weight Body mass index (BMI) is a measurement that can be used to identify possible weight problems. It estimates body fat based on height and weight. Your health care provider can help determine your BMI and help you achieve or maintain a healthy weight. Get regular exercise Get regular exercise. This is one of the most important things you can do for your health. Most adults should: Exercise for at least 150 minutes each week. The exercise should increase your heart rate and make you sweat (moderate-intensity exercise). Do strengthening exercises at least twice a week. This is in addition to the moderate-intensity exercise. Spend less time sitting. Even light physical activity can be beneficial. Watch cholesterol and blood lipids Have your blood tested for lipids and cholesterol at 72 years of age, then have this test every 5 years. You may need to have your cholesterol levels checked more often if: Your lipid or cholesterol levels are high. You are older than 72 years of age. You are at high risk for heart disease. What should I know about cancer screening? Many types of cancers can be detected early and may often be prevented. Depending on your health history and family history, you may need to have cancer screening at various ages. This may include screening for: Colorectal cancer. Prostate cancer. Skin cancer. Lung  cancer. What should I know about heart disease, diabetes, and high blood pressure? Blood pressure and heart disease High blood pressure causes heart disease and increases the risk of stroke. This is more likely to develop in people who have high blood pressure readings or are overweight. Talk with your health care provider about your target blood pressure readings. Have your blood pressure checked: Every 3-5 years if you are 18-39 years of age. Every year if you are 40 years old or older. If you are between the ages of 65 and 75 and are a current or former smoker, ask your health care provider if you should have a one-time screening for abdominal aortic aneurysm (AAA). Diabetes Have regular diabetes screenings. This checks your fasting blood sugar level. Have the screening done: Once every three years after age 45 if you are at a normal weight and have a low risk for diabetes. More often and at a younger age if you are overweight or have a high risk for diabetes. What should I know about preventing infection? Hepatitis B If you have a higher risk for hepatitis B, you should be screened for this virus. Talk with your health care provider to find out if you are at risk for hepatitis B infection. Hepatitis C Blood testing is recommended for: Everyone born from 1945 through 1965. Anyone with known risk factors for hepatitis C. Sexually transmitted infections (STIs) You should be screened each year for STIs, including gonorrhea and chlamydia, if: You are sexually active and are younger than 72 years of age. You are older than 72 years of age and your   health care provider tells you that you are at risk for this type of infection. Your sexual activity has changed since you were last screened, and you are at increased risk for chlamydia or gonorrhea. Ask your health care provider if you are at risk. Ask your health care provider about whether you are at high risk for HIV. Your health care provider  may recommend a prescription medicine to help prevent HIV infection. If you choose to take medicine to prevent HIV, you should first get tested for HIV. You should then be tested every 3 months for as long as you are taking the medicine. Follow these instructions at home: Alcohol use Do not drink alcohol if your health care provider tells you not to drink. If you drink alcohol: Limit how much you have to 0-2 drinks a day. Know how much alcohol is in your drink. In the [place] one drink equals one 12 oz bottle of beer (355 mL), one 5 oz glass of wine (148 mL), or one 1 oz glass of hard liquor (44 mL). Lifestyle Do not use any products that contain nicotine or tobacco. These products include cigarettes, chewing tobacco, and vaping devices, such as e-cigarettes. If you need help quitting, ask your health care provider. Do not use street drugs. Do not share needles. Ask your health care provider for help if you need support or information about quitting drugs. General instructions Schedule regular health, dental, and eye exams. Stay current with your vaccines. Tell your health care provider if: You often feel depressed. You have ever been abused or do not feel safe at home. Summary Adopting a healthy lifestyle and getting preventive care are important in promoting health and wellness. Follow your health care provider's instructions about healthy diet, exercising, and getting tested or screened for diseases. Follow your health care provider's instructions on monitoring your cholesterol and blood pressure. This information is not intended to replace advice given to you by your health care provider. Make sure you discuss any questions you have with your health care provider. Document Revised: 09/16/2020 Document Reviewed: 09/16/2020 [employer]vier Patient Education  2023 [employer]  

## 2022-03-19 LAB — URINALYSIS, ROUTINE W REFLEX MICROSCOPIC
Bilirubin Urine: NEGATIVE
Hgb urine dipstick: NEGATIVE
Ketones, ur: NEGATIVE
Leukocytes,Ua: NEGATIVE
Nitrite: NEGATIVE
RBC / HPF: NONE SEEN (ref 0–?)
Specific Gravity, Urine: 1.025 (ref 1.000–1.030)
Total Protein, Urine: NEGATIVE
Urine Glucose: NEGATIVE
Urobilinogen, UA: 0.2 (ref 0.0–1.0)
pH: 5.5 (ref 5.0–8.0)

## 2022-03-31 ENCOUNTER — Other Ambulatory Visit (HOSPITAL_COMMUNITY): Payer: Self-pay

## 2022-04-03 ENCOUNTER — Other Ambulatory Visit: Payer: Self-pay | Admitting: Physician Assistant

## 2022-04-03 ENCOUNTER — Other Ambulatory Visit (HOSPITAL_COMMUNITY): Payer: Self-pay

## 2022-04-03 DIAGNOSIS — C3491 Malignant neoplasm of unspecified part of right bronchus or lung: Secondary | ICD-10-CM

## 2022-04-03 MED ORDER — OSIMERTINIB MESYLATE 80 MG PO TABS
ORAL_TABLET | Freq: Every day | ORAL | 2 refills | Status: DC
  Filled 2022-04-03 – 2022-04-06 (×2): qty 30, 30d supply, fill #0
  Filled 2022-04-28: qty 30, 30d supply, fill #1
  Filled 2022-05-28: qty 30, 30d supply, fill #2

## 2022-04-04 ENCOUNTER — Other Ambulatory Visit (HOSPITAL_COMMUNITY): Payer: Self-pay

## 2022-04-06 ENCOUNTER — Other Ambulatory Visit (HOSPITAL_COMMUNITY): Payer: Self-pay

## 2022-04-07 ENCOUNTER — Other Ambulatory Visit (HOSPITAL_COMMUNITY): Payer: Self-pay

## 2022-04-23 ENCOUNTER — Other Ambulatory Visit (HOSPITAL_COMMUNITY): Payer: Self-pay

## 2022-04-27 ENCOUNTER — Other Ambulatory Visit: Payer: Medicare PPO

## 2022-04-28 ENCOUNTER — Inpatient Hospital Stay: Payer: Medicare PPO | Attending: Internal Medicine

## 2022-04-28 ENCOUNTER — Ambulatory Visit (HOSPITAL_COMMUNITY)
Admission: RE | Admit: 2022-04-28 | Discharge: 2022-04-28 | Disposition: A | Discharge status: Home / Self Care | Payer: Medicare PPO | Source: Ambulatory Visit | Attending: Internal Medicine | Admitting: Internal Medicine

## 2022-04-28 ENCOUNTER — Other Ambulatory Visit (HOSPITAL_COMMUNITY): Payer: Self-pay

## 2022-04-28 DIAGNOSIS — R059 Cough, unspecified: Secondary | ICD-10-CM | POA: Insufficient documentation

## 2022-04-28 DIAGNOSIS — C3490 Malignant neoplasm of unspecified part of unspecified bronchus or lung: Secondary | ICD-10-CM | POA: Insufficient documentation

## 2022-04-28 DIAGNOSIS — C3411 Malignant neoplasm of upper lobe, right bronchus or lung: Secondary | ICD-10-CM | POA: Insufficient documentation

## 2022-04-28 DIAGNOSIS — J90 Pleural effusion, not elsewhere classified: Secondary | ICD-10-CM | POA: Diagnosis not present

## 2022-04-28 DIAGNOSIS — Z902 Acquired absence of lung [part of]: Secondary | ICD-10-CM | POA: Insufficient documentation

## 2022-04-28 DIAGNOSIS — Z79899 Other long term (current) drug therapy: Secondary | ICD-10-CM | POA: Insufficient documentation

## 2022-04-28 DIAGNOSIS — R911 Solitary pulmonary nodule: Secondary | ICD-10-CM | POA: Diagnosis not present

## 2022-04-28 DIAGNOSIS — Z923 Personal history of irradiation: Secondary | ICD-10-CM | POA: Insufficient documentation

## 2022-04-28 LAB — CBC WITH DIFFERENTIAL (CANCER CENTER ONLY)
Abs Immature Granulocytes: 0.03 10*3/uL (ref 0.00–0.07)
Basophils Absolute: 0 10*3/uL (ref 0.0–0.1)
Basophils Relative: 1 %
Eosinophils Absolute: 0.1 10*3/uL (ref 0.0–0.5)
Eosinophils Relative: 2 %
HCT: 43.3 % (ref 39.0–52.0)
Hemoglobin: 14.8 g/dL (ref 13.0–17.0)
Immature Granulocytes: 1 %
Lymphocytes Relative: 24 %
Lymphs Abs: 1.5 10*3/uL (ref 0.7–4.0)
MCH: 31.9 pg (ref 26.0–34.0)
MCHC: 34.2 g/dL (ref 30.0–36.0)
MCV: 93.3 fL (ref 80.0–100.0)
Monocytes Absolute: 0.7 10*3/uL (ref 0.1–1.0)
Monocytes Relative: 11 %
Neutro Abs: 3.9 10*3/uL (ref 1.7–7.7)
Neutrophils Relative %: 61 %
Platelet Count: 133 10*3/uL — ABNORMAL LOW (ref 150–400)
RBC: 4.64 MIL/uL (ref 4.22–5.81)
RDW: 14.7 % (ref 11.5–15.5)
WBC Count: 6.2 10*3/uL (ref 4.0–10.5)
nRBC: 0 % (ref 0.0–0.2)

## 2022-04-28 LAB — CMP (CANCER CENTER ONLY)
ALT: 26 U/L (ref 0–44)
AST: 24 U/L (ref 15–41)
Albumin: 3 g/dL — ABNORMAL LOW (ref 3.5–5.0)
Alkaline Phosphatase: 55 U/L (ref 38–126)
Anion gap: 7 (ref 5–15)
BUN: 21 mg/dL (ref 8–23)
CO2: 24 mmol/L (ref 22–32)
Calcium: 9 mg/dL (ref 8.9–10.3)
Chloride: 110 mmol/L (ref 98–111)
Creatinine: 1.36 mg/dL — ABNORMAL HIGH (ref 0.61–1.24)
GFR, Estimated: 55 mL/min — ABNORMAL LOW (ref >60)
Glucose, Bld: 111 mg/dL — ABNORMAL HIGH (ref 70–99)
Potassium: 3.9 mmol/L (ref 3.5–5.1)
Sodium: 141 mmol/L (ref 135–145)
Total Bilirubin: 0.2 mg/dL — ABNORMAL LOW (ref 0.3–1.2)
Total Protein: 7 g/dL (ref 6.5–8.1)

## 2022-04-28 MED ORDER — IOHEXOL 300 MG/ML  SOLN
80.0000 mL | Freq: Once | INTRAMUSCULAR | Status: AC | PRN
  Administered 2022-04-28: 80 mL via INTRAVENOUS

## 2022-04-28 MED ORDER — SODIUM CHLORIDE (PF) 0.9 % IJ SOLN
INTRAMUSCULAR | Status: AC
  Filled 2022-04-28: qty 50

## 2022-04-29 ENCOUNTER — Inpatient Hospital Stay (HOSPITAL_BASED_OUTPATIENT_CLINIC_OR_DEPARTMENT_OTHER): Payer: Medicare PPO | Admitting: Internal Medicine

## 2022-04-29 VITALS — BP 146/81 | HR 103 | Temp 98.6°F | Resp 18 | Wt 234.1 lb

## 2022-04-29 DIAGNOSIS — Z923 Personal history of irradiation: Secondary | ICD-10-CM | POA: Diagnosis not present

## 2022-04-29 DIAGNOSIS — C3411 Malignant neoplasm of upper lobe, right bronchus or lung: Secondary | ICD-10-CM | POA: Diagnosis not present

## 2022-04-29 DIAGNOSIS — R059 Cough, unspecified: Secondary | ICD-10-CM | POA: Diagnosis not present

## 2022-04-29 DIAGNOSIS — C3491 Malignant neoplasm of unspecified part of right bronchus or lung: Secondary | ICD-10-CM | POA: Diagnosis not present

## 2022-04-29 DIAGNOSIS — Z902 Acquired absence of lung [part of]: Secondary | ICD-10-CM | POA: Diagnosis not present

## 2022-04-29 DIAGNOSIS — Z79899 Other long term (current) drug therapy: Secondary | ICD-10-CM | POA: Diagnosis not present

## 2022-04-29 NOTE — Progress Notes (Signed)
[facility] [phone]   [phone]  OFFICE PROGRESS NOTE  [name], MD [place] [address]  DIAGNOSIS: Recurrent non-small cell lung cancer, adenocarcinoma initially diagnosed as stage IA (T1c, N0, M0) non-small cell lung cancer, adenocarcinoma presented with right upper lobe lung nodule in April 2018 with recurrence in June 2019.  Biomarker Findings Microsatellite status - MS-Stable Tumor Mutational Burden - TMB-Low (3 Muts/Mb) Genomic Findings For a complete list of the genes assayed, please refer to the Appendix. EGFR exon 19 deletion ([id number]>J) CDKN2A/B loss NKX2-1 amplification 7 Disease relevant genes with no reportable alterations: KRAS, ALK, BRAF, MET, RET, ERBB2, ROS1   PRIOR THERAPY:  1) Status post right upper lobectomy with lymph node dissection under the care of Dr. [name] on 09/02/2016. 2) status post palliative radiotherapy to enlarging right upper lobe lung nodule under the care of Dr. Lisbeth Renshaw completed on Oct 07, 2021.  CURRENT THERAPY: Tagrisso 80 mg p.o. daily.  First dose started November 05, 2017.  Status post 52 months of treatment.  INTERVAL HISTORY: James [name] 71 y.o. male returns to the clinic today for follow-up visit accompanied by his wife.  The patient is feeling fine today with no concerning complaints except for occasional upper respiratory cough with thick mucus especially in the morning.  He denied having any chest pain, shortness of breath except with exertion and no hemoptysis.  He has no nausea, vomiting, diarrhea or constipation.  He has no headache or visual changes.  He denied having any fever or chills.  He has no recent weight loss or night sweats.  He has been tolerating his treatment with Tagrisso fairly well.  He is here today for evaluation with repeat CT scan of the chest, abdomen and pelvis for restaging of his disease.   MEDICAL HISTORY: Past Medical History:   Diagnosis Date   Adenocarcinoma of right lung, stage 1 ([facility]) 09/06/2016   Anxiety    Arthritis    "knees, hips, back" (10/19/2012)   Chronic diastolic congestive heart failure (HCC)    Chronic lower back pain    Colon polyps    10/27/2002, repeat letter 09/17/2007   Coronary artery disease    Coronary artery disease involving native coronary artery of native heart without angina pectoris    Depressive disorder, not elsewhere classified    no meds   Diabetes mellitus without complication ([facility])    diet controlled- no med  (while in hosp 4/18 -elevated cbg   Dyspnea    Fasting hyperglycemia    GERD (gastroesophageal reflux disease)    Heart murmur    Hemoptysis    abnormal CT Chest 01/29/10 - ? new GG changes RUL > not viz on plain cxr 02/26/2010   Hypertension    Mitral regurgitation    severe MR 08/2016   MPN (myeloproliferative neoplasm) ([facility])    1st detected 06/04/1998   Obesity    OSA on CPAP    last sleep study 10 years ago   Other and unspecified hyperlipidemia    Peripheral vascular disease ([facility]) 08/2016   after lung surgey small clots in lungs,after hip dvt-5/16   Pneumonia    4/18   Positive PPD 1965   "non reactive in 2012" (10/19/2012)   Routine general medical examination at a health care facility    S/P CABG x 1 03/24/2017   LIMA to LAD   S/P mitral valve repair 03/24/2017   Complex valvuloplasty  including artificial Gore-tex neochord placement x6 and 28 mm Sorin Annuloflex posterior annuloplasty band   Special screening for malignant neoplasm of prostate    Spinal stenosis, unspecified region other than cervical    Wrist pain, left     ALLERGIES:  is allergic to symbicort [budesonide-formoterol fumarate] and amoxicillin.  MEDICATIONS:  Current Outpatient Medications  Medication Sig Dispense Refill   clindamycin (CLEOCIN-T) 1 % external solution Apply topically 2 (two) times daily. 60 mL 0   empagliflozin (JARDIANCE) 10 MG TABS tablet Take 1 tablet (10 mg  total) by mouth daily before breakfast. 90 tablet 1   ezetimibe (ZETIA) 10 MG tablet TAKE 1 TABLET BY MOUTH EVERY DAY 90 tablet 3   furosemide (LASIX) 20 MG tablet Take 1 tablet (20 mg total) by mouth daily. 90 tablet 3   LORazepam (ATIVAN) 0.5 MG tablet 1 tablet p.o. 30 minutes before the MRI.  May repeat once if needed. 2 tablet 0   methocarbamol (ROBAXIN) 500 MG tablet Take 1 tablet (500 mg total) by mouth every 8 (eight) hours as needed for muscle spasms. 270 tablet 1   metoprolol tartrate (LOPRESSOR) 25 MG tablet TAKE 0.5 TABLETS BY MOUTH 2 TIMES DAILY. 90 tablet 3   Multiple Vitamin (MULTIVITAMIN) tablet Take 1 tablet by mouth daily.     Omega-3 Fatty Acids (FISH OIL) 500 MG CAPS Take 500 mg by mouth daily.      osimertinib mesylate (TAGRISSO) 80 MG tablet TAKE 1 TABLET (80 MG TOTAL) BY MOUTH DAILY. 30 tablet 2   sildenafil (REVATIO) 20 MG tablet Take 4 tablets (80 mg total) by mouth daily as needed. 60 tablet 5   XARELTO 20 MG TABS tablet TAKE 1 TABLET BY MOUTH DAILY WITH SUPPER. 90 tablet 3   No current facility-administered medications for this visit.    SURGICAL HISTORY:  Past Surgical History:  Procedure Laterality Date   ANTERIOR CRUCIATE LIGAMENT REPAIR Left 1967   CARDIAC CATHETERIZATION  2000   CHEST TUBE INSERTION Right 10/19/2012   post bronch   COLONOSCOPY W/ POLYPECTOMY     CORONARY ARTERY BYPASS GRAFT N/A 03/24/2017   Procedure: CORONARY ARTERY BYPASS GRAFTING (CABG)x1 using left internal mammary artery, LIMA-LAD;  Surgeon: Rexene Alberts, MD;  Location: [facility];  Service: Open Heart Surgery;  Laterality: N/A;   FLEXIBLE BRONCHOSCOPY  10/19/2012   Flexible video fiberoptic bronchoscopy with electromagnetic navigation and biopsies.   INTRAVASCULAR PRESSURE WIRE/FFR STUDY N/A 08/11/2016   Procedure: Intravascular Pressure Wire/FFR Study;  Surgeon: [name], MD;  Location: [facility];  Service: Cardiovascular;  Laterality: N/A;   IR ANGIOGRAM PULMONARY  BILATERAL SELECTIVE  02/06/2018   IR ANGIOGRAM SELECTIVE EACH ADDITIONAL VESSEL  02/06/2018   IR ANGIOGRAM SELECTIVE EACH ADDITIONAL VESSEL  02/06/2018   IR INFUSION THROMBOL ARTERIAL INITIAL (MS)  02/06/2018   IR INFUSION THROMBOL ARTERIAL INITIAL (MS)  02/06/2018   IR THROMB F/U EVAL ART/VEN FINAL DAY (MS)  02/07/2018   IR US GUIDE VASC ACCESS RIGHT  02/06/2018   LOBECTOMY Right 09/02/2016   Procedure: RIGHT UPPER LOBECTOMY;  Surgeon: Melrose Nakayama, MD;  Location: [facility];  Service: Thoracic;  Laterality: Right;   LYMPH NODE DISSECTION Right 09/02/2016   Procedure: LYMPH NODE DISSECTION, RIGHT LUNG;  Surgeon: Melrose Nakayama, MD;  Location: [facility];  Service: Thoracic;  Laterality: Right;   MITRAL VALVE REPAIR N/A 03/24/2017   Procedure: MITRAL VALVE REPAIR (MVR) with Sorin [employer]omedics Annuloflex size 28;  Surgeon: Darylene Price  H, MD;  Location: [facility];  Service: Open Heart Surgery;  Laterality: N/A;   RIGHT/LEFT HEART CATH AND CORONARY ANGIOGRAPHY N/A 08/11/2016   Procedure: Right/Left Heart Cath and Coronary Angiography;  Surgeon: [name], MD;  Location: [facility];  Service: Cardiovascular;  Laterality: N/A;   TEE WITHOUT CARDIOVERSION N/A 07/17/2016   Procedure: TRANSESOPHAGEAL ECHOCARDIOGRAM (TEE);  Surgeon: [name], MD;  Location[facility];  Service: Cardiovascular;  Laterality: N/A;   TEE WITHOUT CARDIOVERSION N/A 03/24/2017   Procedure: TRANSESOPHAGEAL ECHOCARDIOGRAM (TEE);  Surgeon: Rexene Alberts, MD;  Location: [facility];  Service: Open Heart Surgery;  Laterality: N/A;   TONSILLECTOMY  1950's   TOTAL HIP ARTHROPLASTY Left 10/05/2014   dr [name]   TOTAL HIP ARTHROPLASTY Left 10/05/2014   Procedure: LEFT TOTAL HIP ARTHROPLASTY ANTERIOR APPROACH;  Surgeon: [name], MD;  Location: [facility];  Service: Orthopedics;  Laterality: Left;   VIDEO ASSISTED THORACOSCOPY (VATS)/WEDGE RESECTION Right 09/02/2016   Procedure: VIDEO ASSISTED THORACOSCOPY (VATS)/RIGHT UPPER  LOBE WEDGE RESECTION;  Surgeon: Melrose Nakayama, MD;  Location: [facility];  Service: Thoracic;  Laterality: Right;   VIDEO BRONCHOSCOPY WITH ENDOBRONCHIAL NAVIGATION N/A 10/19/2012   Procedure: VIDEO BRONCHOSCOPY WITH ENDOBRONCHIAL NAVIGATION;  Surgeon: [name], MD;  Location: [facility];  Service: Thoracic;  Laterality: N/A;   WRIST RECONSTRUCTION Left 12/2009   'proximal row carpectomy" [name]    REVIEW OF SYSTEMS:  Constitutional: positive for fatigue Eyes: negative Ears, nose, mouth, throat, and face: negative Respiratory: positive for cough and dyspnea on exertion Cardiovascular: negative Gastrointestinal: negative Genitourinary:negative Integument/breast: negative Hematologic/lymphatic: negative Musculoskeletal:positive for back pain Neurological: negative Behavioral/Psych: negative Endocrine: negative Allergic/Immunologic: negative   PHYSICAL EXAMINATION: General appearance: alert, cooperative, fatigued, and no distress Head: Normocephalic, without obvious abnormality, atraumatic Neck: no adenopathy, no JVD, supple, symmetrical, trachea midline, and thyroid not enlarged, symmetric, no tenderness/mass/nodules Lymph nodes: Cervical, supraclavicular, and axillary nodes normal. Resp: wheezes RUL Back: symmetric, no curvature. ROM normal. No CVA tenderness. Cardio: regular rate and rhythm, S1, S2 normal, no murmur, click, rub or gallop GI: soft, non-tender; bowel sounds normal; no masses,  no organomegaly Extremities: extremities normal, atraumatic, no cyanosis or edema Neurologic: Alert and oriented X 3, normal strength and tone. Normal symmetric reflexes. Normal coordination and gait  ECOG PERFORMANCE STATUS: 1 - Symptomatic but completely ambulatory  Blood pressure (!) 146/81, pulse (!) 103, temperature 98.6 F (37 C), temperature source Oral, resp. rate 18, weight 234 lb 2 oz (106.2 kg), SpO2 97 %.  LABORATORY DATA: Lab Results  Component Value Date   WBC 6.2  04/28/2022   HGB 14.8 04/28/2022   HCT 43.3 04/28/2022   MCV 93.3 04/28/2022   PLT 133 (L) 04/28/2022      Chemistry      Component Value Date/Time   NA 141 04/28/2022 0952   NA 142 10/01/2016 1534   K 3.9 04/28/2022 0952   K 4.2 10/01/2016 1534   CL 110 04/28/2022 0952   CO2 24 04/28/2022 0952   CO2 28 10/01/2016 1534   BUN 21 04/28/2022 0952   BUN 15.9 10/01/2016 1534   CREATININE 1.36 (H) 04/28/2022 0952   CREATININE 1.10 02/08/2017 1101   CREATININE 1.5 (H) 10/01/2016 1534      Component Value Date/Time   CALCIUM 9.0 04/28/2022 0952   CALCIUM 9.6 10/01/2016 1534   ALKPHOS 55 04/28/2022 0952   ALKPHOS 65 10/01/2016 1534   AST 24 04/28/2022 0952   AST 20 10/01/2016 1534  ALT 26 04/28/2022 0952   ALT 24 10/01/2016 1534   BILITOT 0.2 (L) 04/28/2022 0952   BILITOT 0.46 10/01/2016 1534       RADIOGRAPHIC STUDIES: No results found.   ASSESSMENT AND PLAN: This is a very pleasant 71  years old white male with a stage Ia non-small cell lung cancer status post right upper lobectomy with lymph node dissection on September 02, 2016. The patient he was on observation since that time. Unfortunately there are some concerning findings with nodular density in the posterior right lower lobe as well as increase and right pleural effusion suspicious for disease recurrence. He underwent ultrasound-guided right thoracentesis and the cytology of the pleural fluid was consistent with recurrent adenocarcinoma. He had molecular studies performed by [facility] 1 that showed positive EGFR mutation with deletion 74. The patient was started on treatment with Tagrisso 80 mg p.o. daily on 11/05/2017.  He status post 52 months of treatment. He recently underwent SBRT to the enlarging right lung nodule under the care of Dr. Lisbeth Renshaw. The patient has been tolerating his treatment with Tagrisso fairly well with no concerning adverse effects. He had repeat CT scan of the chest, abdomen and pelvis performed  yesterday.  The final report is still pending but I personally and independently reviewed the scan images and discussed the result and showed the images to the patient today. There is some airspace opacity and the right upper lobe close to the hilar area where the patient received palliative radiotherapy in the past and suspicious for radiation induced pneumonitis/fibrosis.  I will wait for the final report for confirmation and to rule out any other suspicious lesion. I recommended for him to continue his current treatment with Tagrisso for now. I will see him back for follow-up visit in 2 months unless his scan showed any concerning findings, I will call the patient sooner with additional recommendation. The patient was advised to call immediately if he has any other concerning symptoms in the interval. The patient voices understanding of current disease status and treatment options and is in agreement with the current care plan. All questions were answered. The patient knows to call the clinic with any problems, questions or concerns. We can certainly see the patient much sooner if necessary. The total time spent in the appointment was 30 minutes.  Disclaimer: This note was dictated with voice recognition software. Similar sounding words can inadvertently be transcribed and may not be corrected upon review.

## 2022-05-05 ENCOUNTER — Other Ambulatory Visit: Payer: Self-pay

## 2022-05-28 ENCOUNTER — Other Ambulatory Visit (HOSPITAL_COMMUNITY): Payer: Self-pay

## 2022-06-03 ENCOUNTER — Other Ambulatory Visit: Payer: Self-pay

## 2022-06-30 ENCOUNTER — Other Ambulatory Visit: Payer: Self-pay | Admitting: Physician Assistant

## 2022-06-30 ENCOUNTER — Other Ambulatory Visit (HOSPITAL_COMMUNITY): Payer: Self-pay

## 2022-06-30 DIAGNOSIS — C3491 Malignant neoplasm of unspecified part of right bronchus or lung: Secondary | ICD-10-CM

## 2022-06-30 MED ORDER — OSIMERTINIB MESYLATE 80 MG PO TABS
ORAL_TABLET | Freq: Every day | ORAL | 2 refills | Status: DC
  Filled 2022-06-30: qty 30, 30d supply, fill #0
  Filled 2022-07-28: qty 30, 30d supply, fill #1
  Filled 2022-09-01: qty 30, 30d supply, fill #2

## 2022-07-01 ENCOUNTER — Other Ambulatory Visit: Payer: Self-pay

## 2022-07-01 ENCOUNTER — Other Ambulatory Visit (HOSPITAL_COMMUNITY): Payer: Self-pay

## 2022-07-02 ENCOUNTER — Encounter: Payer: Self-pay | Admitting: Internal Medicine

## 2022-07-02 ENCOUNTER — Inpatient Hospital Stay: Payer: Medicare PPO | Attending: Internal Medicine

## 2022-07-02 ENCOUNTER — Inpatient Hospital Stay (HOSPITAL_BASED_OUTPATIENT_CLINIC_OR_DEPARTMENT_OTHER): Payer: Medicare PPO | Admitting: Internal Medicine

## 2022-07-02 ENCOUNTER — Other Ambulatory Visit (HOSPITAL_COMMUNITY): Payer: Self-pay

## 2022-07-02 VITALS — BP 149/77 | HR 81 | Temp 98.4°F | Resp 20 | Wt 235.5 lb

## 2022-07-02 DIAGNOSIS — C3411 Malignant neoplasm of upper lobe, right bronchus or lung: Secondary | ICD-10-CM | POA: Diagnosis not present

## 2022-07-02 DIAGNOSIS — C3490 Malignant neoplasm of unspecified part of unspecified bronchus or lung: Secondary | ICD-10-CM | POA: Diagnosis not present

## 2022-07-02 DIAGNOSIS — Z79899 Other long term (current) drug therapy: Secondary | ICD-10-CM | POA: Diagnosis not present

## 2022-07-02 DIAGNOSIS — D696 Thrombocytopenia, unspecified: Secondary | ICD-10-CM | POA: Diagnosis not present

## 2022-07-02 DIAGNOSIS — Z902 Acquired absence of lung [part of]: Secondary | ICD-10-CM | POA: Diagnosis not present

## 2022-07-02 DIAGNOSIS — C3491 Malignant neoplasm of unspecified part of right bronchus or lung: Secondary | ICD-10-CM

## 2022-07-02 DIAGNOSIS — J90 Pleural effusion, not elsewhere classified: Secondary | ICD-10-CM | POA: Diagnosis not present

## 2022-07-02 LAB — CMP (CANCER CENTER ONLY)
ALT: 20 U/L (ref 0–44)
AST: 21 U/L (ref 15–41)
Albumin: 4 g/dL (ref 3.5–5.0)
Alkaline Phosphatase: 60 U/L (ref 38–126)
Anion gap: 6 (ref 5–15)
BUN: 20 mg/dL (ref 8–23)
CO2: 27 mmol/L (ref 22–32)
Calcium: 9.2 mg/dL (ref 8.9–10.3)
Chloride: 108 mmol/L (ref 98–111)
Creatinine: 1.3 mg/dL — ABNORMAL HIGH (ref 0.61–1.24)
GFR, Estimated: 58 mL/min — ABNORMAL LOW (ref >60)
Glucose, Bld: 131 mg/dL — ABNORMAL HIGH (ref 70–99)
Potassium: 4.4 mmol/L (ref 3.5–5.1)
Sodium: 141 mmol/L (ref 135–145)
Total Bilirubin: 0.5 mg/dL (ref 0.3–1.2)
Total Protein: 6.7 g/dL (ref 6.5–8.1)

## 2022-07-02 LAB — CBC WITH DIFFERENTIAL (CANCER CENTER ONLY)
Abs Immature Granulocytes: 0.03 10*3/uL (ref 0.00–0.07)
Basophils Absolute: 0 10*3/uL (ref 0.0–0.1)
Basophils Relative: 0 %
Eosinophils Absolute: 0.1 10*3/uL (ref 0.0–0.5)
Eosinophils Relative: 2 %
HCT: 44.2 % (ref 39.0–52.0)
Hemoglobin: 15.3 g/dL (ref 13.0–17.0)
Immature Granulocytes: 0 %
Lymphocytes Relative: 22 %
Lymphs Abs: 1.7 10*3/uL (ref 0.7–4.0)
MCH: 32.3 pg (ref 26.0–34.0)
MCHC: 34.6 g/dL (ref 30.0–36.0)
MCV: 93.4 fL (ref 80.0–100.0)
Monocytes Absolute: 0.9 10*3/uL (ref 0.1–1.0)
Monocytes Relative: 12 %
Neutro Abs: 4.7 10*3/uL (ref 1.7–7.7)
Neutrophils Relative %: 64 %
Platelet Count: 147 10*3/uL — ABNORMAL LOW (ref 150–400)
RBC: 4.73 MIL/uL (ref 4.22–5.81)
RDW: 14.4 % (ref 11.5–15.5)
WBC Count: 7.5 10*3/uL (ref 4.0–10.5)
nRBC: 0 % (ref 0.0–0.2)

## 2022-07-02 NOTE — Progress Notes (Signed)
[facility] [place] [phone]   [phone]  OFFICE PROGRESS NOTE  [name], MD [place] [address]  DIAGNOSIS: Recurrent non-small cell lung cancer, adenocarcinoma initially diagnosed as stage IA (T1c, N0, M0) non-small cell lung cancer, adenocarcinoma presented with right upper lobe lung nodule in April 2018 with recurrence in June 2019.  Biomarker Findings Microsatellite status - MS-Stable Tumor Mutational Burden - TMB-Low (3 Muts/Mb) Genomic Findings For a complete list of the genes assayed, please refer to the Appendix. EGFR exon 19 deletion ([id number]>H) CDKN2A/B loss NKX2-1 amplification 7 Disease relevant genes with no reportable alterations: KRAS, ALK, BRAF, MET, RET, ERBB2, ROS1   PRIOR THERAPY:  1) Status post right upper lobectomy with lymph node dissection under the care of Dr. [name] on 09/02/2016. 2) status post palliative radiotherapy to enlarging right upper lobe lung nodule under the care of Dr. Lisbeth Renshaw completed on Oct 07, 2021.  CURRENT THERAPY: Tagrisso 80 mg p.o. daily.  First dose started November 05, 2017.  Status post 54 months of treatment.  INTERVAL HISTORY: James [name] 72 y.o. male returns to the clinic today for follow-up visit accompanied by his wife.  The patient is feeling fine today with no concerning complaints except for shortness of breath with exertion and chronic back pain.  He denied having any chest pain, cough or hemoptysis.  He has no nausea, vomiting, diarrhea or constipation.  He has no headache or visual changes.  He has been tolerating his treatment with Tagrisso fairly well.  He is here for evaluation and repeat blood work.   MEDICAL HISTORY: Past Medical History:  Diagnosis Date   Adenocarcinoma of right lung, stage 1 ([facility]) 09/06/2016   Anxiety    Arthritis    "knees, hips, back" (10/19/2012)   Chronic diastolic congestive heart failure (HCC)    Chronic lower back pain     Colon polyps    10/27/2002, repeat letter 09/17/2007   Coronary artery disease    Coronary artery disease involving native coronary artery of native heart without angina pectoris    Depressive disorder, not elsewhere classified    no meds   Diabetes mellitus without complication ([facility])    diet controlled- no med  (while in hosp 4/18 -elevated cbg   Dyspnea    Fasting hyperglycemia    GERD (gastroesophageal reflux disease)    Heart murmur    Hemoptysis    abnormal CT Chest 01/29/10 - ? new GG changes RUL > not viz on plain cxr 02/26/2010   Hypertension    Mitral regurgitation    severe MR 08/2016   MPN (myeloproliferative neoplasm) ([place] [facility])    1st detected 06/04/1998   Obesity    OSA on CPAP    last sleep study 10 years ago   Other and unspecified hyperlipidemia    Peripheral vascular disease ([facility]) 08/2016   after lung surgey small clots in lungs,after hip dvt-5/16   Pneumonia    4/18   Positive PPD 1965   "non reactive in 2012" (10/19/2012)   Routine general medical examination at a health care facility    S/P CABG x 1 03/24/2017   LIMA to LAD   S/P mitral valve repair 03/24/2017   Complex valvuloplasty including artificial Gore-tex neochord placement x6 and 28 mm Sorin Annuloflex posterior annuloplasty band   Special screening for malignant neoplasm of prostate    Spinal stenosis, unspecified region other than cervical    Wrist pain,  left     ALLERGIES:  is allergic to symbicort [budesonide-formoterol fumarate] and amoxicillin.  MEDICATIONS:  Current Outpatient Medications  Medication Sig Dispense Refill   clindamycin (CLEOCIN-T) 1 % external solution Apply topically 2 (two) times daily. 60 mL 0   empagliflozin (JARDIANCE) 10 MG TABS tablet Take 1 tablet (10 mg total) by mouth daily before breakfast. 90 tablet 1   ezetimibe (ZETIA) 10 MG tablet TAKE 1 TABLET BY MOUTH EVERY DAY 90 tablet 3   furosemide (LASIX) 20 MG tablet Take 1 tablet (20 mg total) by mouth daily. 90 tablet 3    LORazepam (ATIVAN) 0.5 MG tablet 1 tablet p.o. 30 minutes before the MRI.  May repeat once if needed. 2 tablet 0   methocarbamol (ROBAXIN) 500 MG tablet Take 1 tablet (500 mg total) by mouth every 8 (eight) hours as needed for muscle spasms. 270 tablet 1   metoprolol tartrate (LOPRESSOR) 25 MG tablet TAKE 0.5 TABLETS BY MOUTH 2 TIMES DAILY. 90 tablet 3   Multiple Vitamin (MULTIVITAMIN) tablet Take 1 tablet by mouth daily.     Omega-3 Fatty Acids (FISH OIL) 500 MG CAPS Take 500 mg by mouth daily.      osimertinib mesylate (TAGRISSO) 80 MG tablet TAKE 1 TABLET (80 MG TOTAL) BY MOUTH DAILY. 30 tablet 2   sildenafil (REVATIO) 20 MG tablet Take 4 tablets (80 mg total) by mouth daily as needed. 60 tablet 5   XARELTO 20 MG TABS tablet TAKE 1 TABLET BY MOUTH DAILY WITH SUPPER. 90 tablet 3   No current facility-administered medications for this visit.    SURGICAL HISTORY:  Past Surgical History:  Procedure Laterality Date   ANTERIOR CRUCIATE LIGAMENT REPAIR Left 1967   CARDIAC CATHETERIZATION  2000   CHEST TUBE INSERTION Right 10/19/2012   post bronch   COLONOSCOPY W/ POLYPECTOMY     CORONARY ARTERY BYPASS GRAFT N/A 03/24/2017   Procedure: CORONARY ARTERY BYPASS GRAFTING (CABG)x1 using left internal mammary artery, LIMA-LAD;  Surgeon: Rexene Alberts, MD;  Location: [facility];  Service: Open Heart Surgery;  Laterality: N/A;   FLEXIBLE BRONCHOSCOPY  10/19/2012   Flexible video fiberoptic bronchoscopy with electromagnetic navigation and biopsies.   INTRAVASCULAR PRESSURE WIRE/FFR STUDY N/A 08/11/2016   Procedure: Intravascular Pressure Wire/FFR Study;  Surgeon: [name], MD;  Location: [facility];  Service: Cardiovascular;  Laterality: N/A;   IR ANGIOGRAM PULMONARY BILATERAL SELECTIVE  02/06/2018   IR ANGIOGRAM SELECTIVE EACH ADDITIONAL VESSEL  02/06/2018   IR ANGIOGRAM SELECTIVE EACH ADDITIONAL VESSEL  02/06/2018   IR INFUSION THROMBOL ARTERIAL INITIAL (MS)  02/06/2018   IR INFUSION  THROMBOL ARTERIAL INITIAL (MS)  02/06/2018   IR THROMB F/U EVAL ART/VEN FINAL DAY (MS)  02/07/2018   IR US GUIDE VASC ACCESS RIGHT  02/06/2018   LOBECTOMY Right 09/02/2016   Procedure: RIGHT UPPER LOBECTOMY;  Surgeon: Melrose Nakayama, MD;  Location: [facility];  Service: Thoracic;  Laterality: Right;   LYMPH NODE DISSECTION Right 09/02/2016   Procedure: LYMPH NODE DISSECTION, RIGHT LUNG;  Surgeon: Melrose Nakayama, MD;  Location: [facility];  Service: Thoracic;  Laterality: Right;   MITRAL VALVE REPAIR N/A 03/24/2017   Procedure: MITRAL VALVE REPAIR (MVR) with Sorin [employer]omed[employer] Annuloflex size 28;  Surgeon: Rexene Alberts, MD;  Location: [facility];  Service: Open Heart Surgery;  Laterality: N/A;   RIGHT/LEFT HEART CATH AND CORONARY ANGIOGRAPHY N/A 08/11/2016   Procedure: Right/Left Heart Cath and Coronary Angiography;  Surgeon: [name],  MD;  Location: [facility];  Service: Cardiovascular;  Laterality: N/A;   TEE WITHOUT CARDIOVERSION N/A 07/17/2016   Procedure: TRANSESOPHAGEAL ECHOCARDIOGRAM (TEE);  Surgeon: [name], MD;  Location: [facility];  Service: Cardiovascular;  Laterality: N/A;   TEE WITHOUT CARDIOVERSION N/A 03/24/2017   Procedure: TRANSESOPHAGEAL ECHOCARDIOGRAM (TEE);  Surgeon: Rexene Alberts, MD;  Location: [facility];  Service: Open Heart Surgery;  Laterality: N/A;   TONSILLECTOMY  1950's   TOTAL HIP ARTHROPLASTY Left 10/05/2014   dr [name]   TOTAL HIP ARTHROPLASTY Left 10/05/2014   Procedure: LEFT TOTAL HIP ARTHROPLASTY ANTERIOR APPROACH;  Surgeon: [name], MD;  Location: [facility];  Service: Orthopedics;  Laterality: Left;   VIDEO ASSISTED THORACOSCOPY (VATS)/WEDGE RESECTION Right 09/02/2016   Procedure: VIDEO ASSISTED THORACOSCOPY (VATS)/RIGHT UPPER LOBE WEDGE RESECTION;  Surgeon: Melrose Nakayama, MD;  Location: [facility];  Service: Thoracic;  Laterality: Right;   VIDEO BRONCHOSCOPY WITH ENDOBRONCHIAL NAVIGATION N/A 10/19/2012   Procedure: VIDEO BRONCHOSCOPY WITH  ENDOBRONCHIAL NAVIGATION;  Surgeon: [name], MD;  Location: [facility];  Service: Thoracic;  Laterality: N/A;   WRIST RECONSTRUCTION Left 12/2009   'proximal row carpectomy" [name]    REVIEW OF SYSTEMS:  A comprehensive review of systems was negative except for: Constitutional: positive for fatigue Respiratory: positive for dyspnea on exertion Musculoskeletal: positive for back pain   PHYSICAL EXAMINATION: General appearance: alert, cooperative, fatigued, and no distress Head: Normocephalic, without obvious abnormality, atraumatic Neck: no adenopathy, no JVD, supple, symmetrical, trachea midline, and thyroid not enlarged, symmetric, no tenderness/mass/nodules Lymph nodes: Cervical, supraclavicular, and axillary nodes normal. Resp: clear to auscultation bilaterally Back: symmetric, no curvature. ROM normal. No CVA tenderness. Cardio: regular rate and rhythm, S1, S2 normal, no murmur, click, rub or gallop GI: soft, non-tender; bowel sounds normal; no masses,  no organomegaly Extremities: extremities normal, atraumatic, no cyanosis or edema  ECOG PERFORMANCE STATUS: 1 - Symptomatic but completely ambulatory  Blood pressure (!) 149/77, pulse 81, temperature 98.4 F (36.9 C), temperature source Oral, resp. rate 20, weight 235 lb 8 oz (106.8 kg), SpO2 97 %.  LABORATORY DATA: Lab Results  Component Value Date   WBC 7.5 07/02/2022   HGB 15.3 07/02/2022   HCT 44.2 07/02/2022   MCV 93.4 07/02/2022   PLT 147 (L) 07/02/2022      Chemistry      Component Value Date/Time   NA 141 04/28/2022 0952   NA 142 10/01/2016 1534   K 3.9 04/28/2022 0952   K 4.2 10/01/2016 1534   CL 110 04/28/2022 0952   CO2 24 04/28/2022 0952   CO2 28 10/01/2016 1534   BUN 21 04/28/2022 0952   BUN 15.9 10/01/2016 1534   CREATININE 1.36 (H) 04/28/2022 0952   CREATININE 1.10 02/08/2017 1101   CREATININE 1.5 (H) 10/01/2016 1534      Component Value Date/Time   CALCIUM 9.0 04/28/2022 0952   CALCIUM 9.6  10/01/2016 1534   ALKPHOS 55 04/28/2022 0952   ALKPHOS 65 10/01/2016 1534   AST 24 04/28/2022 0952   AST 20 10/01/2016 1534   ALT 26 04/28/2022 0952   ALT 24 10/01/2016 1534   BILITOT 0.2 (L) 04/28/2022 0952   BILITOT 0.46 10/01/2016 1534       RADIOGRAPHIC STUDIES: No results found.   ASSESSMENT AND PLAN: This is a very pleasant 72  years old white male with a stage Ia non-small cell lung cancer status post right upper lobectomy with lymph node dissection on September 02, 2016. The patient he was on observation since that time. Unfortunately there are some concerning findings with nodular density in the posterior right lower lobe as well as increase and right pleural effusion suspicious for disease recurrence. He underwent ultrasound-guided right thoracentesis and the cytology of the pleural fluid was consistent with recurrent adenocarcinoma. He had molecular studies performed by [facility] 1 that showed positive EGFR mutation with deletion 27. The patient was started on treatment with Tagrisso 80 mg p.o. daily on 11/05/2017.  He status post 54 months of treatment. He recently underwent SBRT to the enlarging right lung nodule under the care of Dr. Lisbeth Renshaw. The patient is tolerating his treatment with Tagrisso currently well with no concerning adverse effects. He had repeat blood work performed earlier today and CBC is unremarkable except for mild thrombocytopenia.  Comprehensive metabolic panel is still pending. I recommended for the patient to continue his current treatment with Tagrisso the same dose. I will see him back for follow-up visit in 3 months for evaluation with repeat CT scan of the chest, abdomen and pelvis for restaging of his disease. The patient was advised to call immediately if he has any concerning symptoms in the interval. The patient voices understanding of current disease status and treatment options and is in agreement with the current care plan. All questions were  answered. The patient knows to call the clinic with any problems, questions or concerns. We can certainly see the patient much sooner if necessary. The total time spent in the appointment was 20 minutes.  Disclaimer: This note was dictated with voice recognition software. Similar sounding words can inadvertently be transcribed and may not be corrected upon review.

## 2022-07-09 DIAGNOSIS — G4733 Obstructive sleep apnea (adult) (pediatric): Secondary | ICD-10-CM | POA: Diagnosis not present

## 2022-07-13 ENCOUNTER — Other Ambulatory Visit: Payer: Self-pay | Admitting: Cardiology

## 2022-07-14 DIAGNOSIS — H2513 Age-related nuclear cataract, bilateral: Secondary | ICD-10-CM | POA: Diagnosis not present

## 2022-07-14 DIAGNOSIS — H43812 Vitreous degeneration, left eye: Secondary | ICD-10-CM | POA: Diagnosis not present

## 2022-07-14 DIAGNOSIS — H02884 Meibomian gland dysfunction left upper eyelid: Secondary | ICD-10-CM | POA: Diagnosis not present

## 2022-07-14 DIAGNOSIS — H02881 Meibomian gland dysfunction right upper eyelid: Secondary | ICD-10-CM | POA: Diagnosis not present

## 2022-07-14 DIAGNOSIS — E119 Type 2 diabetes mellitus without complications: Secondary | ICD-10-CM | POA: Diagnosis not present

## 2022-07-14 LAB — HM DIABETES EYE EXAM

## 2022-07-16 ENCOUNTER — Encounter: Payer: Self-pay | Admitting: Internal Medicine

## 2022-07-23 DIAGNOSIS — G4733 Obstructive sleep apnea (adult) (pediatric): Secondary | ICD-10-CM | POA: Diagnosis not present

## 2022-07-25 NOTE — Progress Notes (Unsigned)
Cardiology Office Note    Date:  07/25/2022   ID:  James [name], DOB 1951-02-19, MRN [id number]  PCP:  [name], MD  Cardiologist:  [name]   No chief complaint on file.   History of Present Illness:  James [name] is a 72 y.o. male with PMH of DM II, HLD, and severe MR. he was originally noted to have a loud murmur by his primary care physician.  Initial echocardiogram suggested mild MR, however it also suggested partially flail leaflet.  MR was thought to be underestimated.  TEE showed a flail P3 segment with ruptured cord and a severe MR.  Cardiac catheterization performed on 08/11/2016 showed 65% mid to distal LAD lesion, FFR borderline at 0.81, EF 65%.  Normal cardiac output and RV/LV filling pressure.  He had a PET scan on 08/17/2016 that showed a hypermetabolic solid 2.6 cm anterior right upper lobe pulmonary nodule compatible with prior bronchogenic, no hypermetabolic thoracic lymphadenopathy or distal metastatic disease.  There were nonspecific and mild asymmetric hypermetabolism in the left pontine tonsil and the left tongue base region.  CT surgery recommended lobectomy for his lung mass with possible CABG with LIMA to LAD and MV repair.  He underwent right upper lobe lobectomy in April.  Pathology positive for adenocarcinoma stage Ia.  Serial CT scan was recommended by oncology.  Post procedure, he became very short of breath.  CTA was positive for a small PE, he was started on Xarelto.  Repeat echocardiogram obtained on 02/15/2017 showed EF 60-65%, persistent MR.  He eventually underwent mitral valve repair with Sorin Carbomedics Annuloglex size 28, LIMA to LAD by Dr. Roxy Manns. He was DC on coumadin instead of Xarelto. Completed 6 months of anticoagulation for provoked PE and then it was stopped.   In August 20-19 CT showed a right pleural effusion. Thoracentesis was positive for recurrent CA. He was started on Tagrisso by Dr. Inda Merlin. He was admitted on February 06, 2018 with  submassive PE. He was treated with  EKOS with bilateral intra-arterial TPA, patient completed 6 days of IV heparin. The original plan was to transition to Xarelto after 5 days of IV heparin.  However he developed left flank pain, hematuria and right leg painful swelling.  Hence IV heparin was continued.  Both Urology and OIrthopedics evaluated and cleared for transitioning to [place].  Xarelto was started on 02/12/2018.  Hematuria decreased  Right leg pain also improved. MRI did show evidence of hematoma that was improving. He had chronic DVT in right peroneal vein.  Since patient was started on Xarelto, aspirin discontinued to minimize bleeding risk.   In April he had a new nodule noted on Chest CT and had RT for this area. He was also having a lot of back problems and had a steroid taper per Ortho. This resulted in increased blood sugar and swelling. Swelling is some better. He does note some increased SOB since he had RT.  Sees Dr Posey Pronto now for CKD. On low salt diet. Is scheduled for complete lab with Dr Ronnald Ramp this fall. Has not started taking Jardiance yet. He is taking lasix 5 days a week.   Past Medical History:  Diagnosis Date   Adenocarcinoma of right lung, stage 1 ([place]) 09/06/2016   Anxiety    Arthritis    "knees, hips, back" (10/19/2012)   Chronic diastolic congestive heart failure ([place])    Chronic lower back pain    Colon polyps    10/27/2002, repeat letter  09/17/2007   Coronary artery disease    Coronary artery disease involving native coronary artery of native heart without angina pectoris    Depressive disorder, not elsewhere classified    no meds   Diabetes mellitus without complication ([place])    diet controlled- no med  (while in hosp 4/18 -elevated cbg   Dyspnea    Fasting hyperglycemia    GERD (gastroesophageal reflux disease)    Heart murmur    Hemoptysis    abnormal CT Chest 01/29/10 - ? new GG changes RUL > not viz on plain cxr 02/26/2010   Hypertension    Mitral regurgitation     severe MR 08/2016   MPN (myeloproliferative neoplasm) ([place])    1st detected 06/04/1998   Obesity    OSA on CPAP    last sleep study 10 years ago   Other and unspecified hyperlipidemia    Peripheral vascular disease ([place]) 08/2016   after lung surgey small clots in lungs,after hip dvt-5/16   Pneumonia    4/18   Positive PPD 1965   "non reactive in 2012" (10/19/2012)   Routine general medical examination at a health care facility    S/P CABG x 1 03/24/2017   LIMA to LAD   S/P mitral valve repair 03/24/2017   Complex valvuloplasty including artificial Gore-tex neochord placement x6 and 28 mm Sorin Annuloflex posterior annuloplasty band   Special screening for malignant neoplasm of prostate    Spinal stenosis, unspecified region other than cervical    Wrist pain, left     Past Surgical History:  Procedure Laterality Date   ANTERIOR CRUCIATE LIGAMENT REPAIR Left [place]   CHEST TUBE INSERTION Right 10/19/2012   post bronch   COLONOSCOPY W/ POLYPECTOMY     CORONARY ARTERY BYPASS GRAFT N/A 03/24/2017   Procedure: CORONARY ARTERY BYPASS GRAFTING (CABG)x1 using left internal mammary artery, LIMA-LAD;  Surgeon: Rexene Alberts, MD;  Location: [facility];  Service: Open Heart Surgery;  Laterality: N/A;   FLEXIBLE BRONCHOSCOPY  10/19/2012   Flexible video fiberoptic bronchoscopy with electromagnetic navigation and biopsies.   INTRAVASCULAR PRESSURE WIRE/FFR STUDY N/A 08/11/2016   Procedure: Intravascular Pressure Wire/FFR Study;  Surgeon: Deandre Stansel M Martinique, MD;  Location: [facility];  Service: Cardiovascular;  Laterality: N/A;   IR ANGIOGRAM PULMONARY BILATERAL SELECTIVE  02/06/2018   IR ANGIOGRAM SELECTIVE EACH ADDITIONAL VESSEL  02/06/2018   IR ANGIOGRAM SELECTIVE EACH ADDITIONAL VESSEL  02/06/2018   IR INFUSION THROMBOL ARTERIAL INITIAL (MS)  02/06/2018   IR INFUSION THROMBOL ARTERIAL INITIAL (MS)  02/06/2018   IR THROMB F/U EVAL ART/VEN FINAL DAY (MS)  02/07/2018    IR US GUIDE VASC ACCESS RIGHT  02/06/2018   LOBECTOMY Right 09/02/2016   Procedure: RIGHT UPPER LOBECTOMY;  Surgeon: Melrose Nakayama, MD;  Location: [facility];  Service: Thoracic;  Laterality: Right;   LYMPH NODE DISSECTION Right 09/02/2016   Procedure: LYMPH NODE DISSECTION, RIGHT LUNG;  Surgeon: Melrose Nakayama, MD;  Location: [facility];  Service: Thoracic;  Laterality: Right;   MITRAL VALVE REPAIR N/A 03/24/2017   Procedure: MITRAL VALVE REPAIR (MVR) with Sorin Carbomedics Annuloflex size 28;  Surgeon: Rexene Alberts, MD;  Location: [facility];  Service: Open Heart Surgery;  Laterality: N/A;   RIGHT/LEFT HEART CATH AND CORONARY ANGIOGRAPHY N/A 08/11/2016   Procedure: Right/Left Heart Cath and Coronary Angiography;  Surgeon: Raizy Auzenne M Martinique, MD;  Location: [facility];  Service: Cardiovascular;  Laterality:  N/A;   TEE WITHOUT CARDIOVERSION N/A 07/17/2016   Procedure: TRANSESOPHAGEAL ECHOCARDIOGRAM (TEE);  Surgeon: [name], MD;  Location: [facility];  Service: Cardiovascular;  Laterality: N/A;   TEE WITHOUT CARDIOVERSION N/A 03/24/2017   Procedure: TRANSESOPHAGEAL ECHOCARDIOGRAM (TEE);  Surgeon: Rexene Alberts, MD;  Location: [facility];  Service: Open Heart Surgery;  Laterality: N/A;   TONSILLECTOMY  1950's   TOTAL HIP ARTHROPLASTY Left 10/05/2014   dr [name]   TOTAL HIP ARTHROPLASTY Left 10/05/2014   Procedure: LEFT TOTAL HIP ARTHROPLASTY ANTERIOR APPROACH;  Surgeon: [name], MD;  Location: [facility];  Service: Orthopedics;  Laterality: Left;   VIDEO ASSISTED THORACOSCOPY (VATS)/WEDGE RESECTION Right 09/02/2016   Procedure: VIDEO ASSISTED THORACOSCOPY (VATS)/RIGHT UPPER LOBE WEDGE RESECTION;  Surgeon: Melrose Nakayama, MD;  Location: [facility];  Service: Thoracic;  Laterality: Right;   VIDEO BRONCHOSCOPY WITH ENDOBRONCHIAL NAVIGATION N/A 10/19/2012   Procedure: VIDEO BRONCHOSCOPY WITH ENDOBRONCHIAL NAVIGATION;  Surgeon: [name], MD;  Location: [facility];  Service: Thoracic;   Laterality: N/A;   WRIST RECONSTRUCTION Left 12/2009   'proximal row carpectomy" [name]    Current Medications: Outpatient Medications Prior to Visit  Medication Sig Dispense Refill   clindamycin (CLEOCIN-T) 1 % external solution Apply topically 2 (two) times daily. 60 mL 0   empagliflozin (JARDIANCE) 10 MG TABS tablet Take 1 tablet (10 mg total) by mouth daily before breakfast. 90 tablet 1   ezetimibe (ZETIA) 10 MG tablet TAKE 1 TABLET BY MOUTH EVERY DAY 90 tablet 3   furosemide (LASIX) 20 MG tablet TAKE 1 TABLET BY MOUTH EVERY DAY 90 tablet 2   LORazepam (ATIVAN) 0.5 MG tablet 1 tablet p.o. 30 minutes before the MRI.  May repeat once if needed. 2 tablet 0   methocarbamol (ROBAXIN) 500 MG tablet Take 1 tablet (500 mg total) by mouth every 8 (eight) hours as needed for muscle spasms. 270 tablet 1   metoprolol tartrate (LOPRESSOR) 25 MG tablet TAKE 0.5 TABLETS BY MOUTH 2 TIMES DAILY. 90 tablet 3   Multiple Vitamin (MULTIVITAMIN) tablet Take 1 tablet by mouth daily.     Omega-3 Fatty Acids (FISH OIL) 500 MG CAPS Take 500 mg by mouth daily.      osimertinib mesylate (TAGRISSO) 80 MG tablet TAKE 1 TABLET (80 MG TOTAL) BY MOUTH DAILY. 30 tablet 2   sildenafil (REVATIO) 20 MG tablet Take 4 tablets (80 mg total) by mouth daily as needed. 60 tablet 5   XARELTO 20 MG TABS tablet TAKE 1 TABLET BY MOUTH DAILY WITH SUPPER. 90 tablet 3   No facility-administered medications prior to visit.     Allergies:   Symbicort [budesonide-formoterol fumarate] and Amoxicillin   Social History   Socioeconomic History   Marital status: Married    Spouse name: Not on file   Number of children: 3   Years of education: Not on file   Highest education level: Not on file  Occupational History   Occupation: Retired, Financial risk analyst   Occupation: Retired, Real estate    Comment: Slow  Tobacco Use   Smoking status: Never   Smokeless tobacco: Never  Vaping Use   Vaping Use: Never used  Substance and Sexual  Activity   Alcohol use: Yes    Alcohol/week: 1.0 standard drink of alcohol    Types: 1 Cans of beer per week    Comment: none since 3 weeks   Drug use: No   Sexual activity: Never    Partners: Female  Other Topics Concern   Not on file  Social History Narrative   HSG, [employer]. Married '73.  2 sons - '74,   '76;  1 daughter -  '77  6 grandchildren.   Work - IT consultant now retired. ACP - not fully discussed.                   Social Determinants of Health   Financial Resource Strain: Not on file  Food Insecurity: Unknown (02/11/2018)   Hunger Vital Sign    Worried About Running Out of Food in the Last Year: Patient declined    [place] in the Last Year: Patient declined  Transportation Needs: Unknown (02/11/2018)   PRAPARE - Hydrologist (Medical): Patient declined    Lack of Transportation (Non-Medical): Patient declined  Physical Activity: Unknown (02/11/2018)   Exercise Vital Sign    Days of Exercise per Week: Patient declined    Minutes of Exercise per Session: Patient declined  Stress: Not on file  Social Connections: Not on file     Family History:  The patient's family history includes Heart attack in his father; Heart disease in his father and paternal uncle; Heart failure in his mother.   ROS:   Please see the history of present illness.    ROS All other systems reviewed and are negative.   PHYSICAL EXAM:   VS:  There were no vitals taken for this visit.   GENERAL:  Well appearing WM in NAD HEENT:  PERRL, EOMI, sclera are clear. Oropharynx is clear. NECK:  No jugular venous distention, carotid upstroke brisk and symmetric, no bruits, no thyromegaly or adenopathy LUNGS:  Clear to auscultation bilaterally CHEST:  Unremarkable HEART:  RRR,  PMI not displaced or sustained,S1 and S2 within normal limits, no S3, no S4: no clicks, no rubs, gr 2/6 systolic murmur LSB ABD:  Soft, nontender. BS +, no  masses or bruits. No hepatomegaly, no splenomegaly EXT:  2 + pulses throughout, 2+ pitting ankle edema, no cyanosis no clubbing SKIN:  Warm and dry.  No rashes NEURO:  Alert and oriented x 3. Cranial nerves II through XII intact. PSYCH:  Cognitively intact      Wt Readings from Last 3 Encounters:  07/02/22 235 lb 8 oz (106.8 kg)  04/29/22 234 lb 2 oz (106.2 kg)  03/18/22 233 lb (105.7 kg)      Studies/Labs Reviewed:   EKG:  EKG is ordered today. NSR rate 69. Poor R wave progression. No change. I have personally reviewed and interpreted this study.   Recent Labs: 03/18/2022: Pro B Natriuretic peptide (BNP) 77.0; TSH 2.39 07/02/2022: ALT 20; BUN 20; Creatinine 1.30; Hemoglobin 15.3; Platelet Count 147; Potassium 4.4; Sodium 141   Lipid Panel    Component Value Date/Time   CHOL 216 (H) 03/18/2022 1557   CHOL 196 01/22/2021 0829   TRIG 97.0 03/18/2022 1557   TRIG 108 05/20/2006 0825   HDL 58.40 03/18/2022 1557   HDL 45 01/22/2021 0829   CHOLHDL 4 03/18/2022 1557   VLDL 19.4 03/18/2022 1557   LDLCALC 139 (H) 03/18/2022 1557   LDLCALC 123 (H) 01/22/2021 0829   LDLCALC 136 (H) 12/26/2019 1150   LDLDIRECT 177.0 07/24/2015 1052    Additional studies/ records that were reviewed today include:    Cath 08/11/2016 Conclusion     Prox LAD to Mid LAD lesion, 30 %stenosed. Mid LAD to Dist LAD lesion, 65 %stenosed.  Prox RCA to Mid RCA lesion, 15 %stenosed. The left ventricular systolic function is normal. LV end diastolic pressure is normal. The left ventricular ejection fraction is 55-65% by visual estimate. LV end diastolic pressure is normal.   1. Borderline single vessel obstructive CAD involving the mid LAD. FFR 0.81 2. Normal LV function EF 65% 3. Normal right heart and LV filling pressures 4. Normal Cardiac output   Plan: will refer to CT surgery for consideration of MV repair. The stenosis in the LAD will be discussed. He is asymptomatic and I would favor treating it  medically. If he develops symptoms in the future it could be treated with PCI.         Echo 02/15/2017 LV EF: 60% -   65%  Study Conclusions   - Left ventricle: The cavity size was normal. Wall thickness was   normal. Systolic function was normal. The estimated ejection   fraction was in the range of 60% to 65%. - Aortic valve: AV is thickened, calcified with minimally   restricted motion. - Mitral valve: Prolapse fo the posterior mitral leaflet. MR is at   least moderate in intensity Calcified annulus. Mildly thickened   leaflets . - Left atrium: The atrium was mildly dilated.     CABG with MVR 03/24/2017 Preoperative Diagnosis:       Severe Mitral Regurgitation Single-vessel Coronary Artery Disease   Postoperative Diagnosis:    Same   Procedure:        Mitral Valve Repair             Complex valvuloplasty including artificial Gore-tex neochord placement x6             Sorin Carbomedics Annuloflex posterior annuloplasty band (size 10mm, catalog #[id number], serial [id number])   Coronary Artery Bypass Grafting x 1              Left Internal Mammary Artery to Distal Left Anterior Descending Coronary Artery   Echo 06/10/17: Study Conclusions   - Left ventricle: The cavity size was normal. There was moderate   concentric hypertrophy. Systolic function was vigorous. The   estimated ejection fraction was in the range of 65% to 70%. Wall   motion was normal; there were no regional wall motion   abnormalities. Doppler parameters are consistent with abnormal   left ventricular relaxation (grade 1 diastolic dysfunction). - Aortic valve: Trileaflet; mildly thickened, mildly calcified   leaflets. Transvalvular velocity was minimally increased. There   was no stenosis. There was no regurgitation. - Mitral valve: S/P mitral valve repair with fixed posterior   leaflet. Transvalvular velocity was elevated. There was no   regurgitation. - Right ventricle: The cavity size was normal.  Wall thickness was   normal. Systolic function was normal. - Tricuspid valve: There was no regurgitation. - Pericardium, extracardiac: A mild pericardial effusion was   identified posterior to the heart. Features were not consistent   with tamponade physiology.   Impressions:   - Since the last study on 02/15/2017 mitral valve is post repair.   Transmitral velocities are elevated with mean gradient 8 mmHg.   LVEF is hyperdynamic.  Echo 02/07/18: Study Conclusions   - Left ventricle: The cavity size was mildly reduced. Wall   thickness was increased in a pattern of mild LVH. Systolic   function was vigorous. The estimated ejection fraction was in the   range of 65% to 70%. - Mitral valve: s/p MV repair. Motion is difficult to see due to  poor acoustic windows Peak and mean gradiaeants through the valve   are 7 and 4 mm Hg respectively. MVA by P T1/2 is 1.75 cm2   consistent with mild MS - Right ventricle: RV is difficult to visualize, even with Definity   OVerall RVEF appears mildly reduced. The cavity size was mildly   dilated. - Right atrium: The atrium was mildly dilated.  Echo 01/29/19: IMPRESSIONS     1. Left ventricular ejection fraction, by visual estimation, is 50 to  55%. The left ventricle has normal function. Normal left ventricular size.  Left ventricular septal wall thickness was mildly increased. Mildly  increased left ventricular posterior  wall thickness. There is mildly increased left ventricular hypertrophy.   2. Elevated left ventricular end-diastolic pressure.   3. Left ventricular diastolic Doppler parameters are consistent with  pseudonormalization pattern of LV diastolic filling.   4. Global right ventricle has normal systolic function.The right  ventricular size is normal. No increase in right ventricular wall  thickness.   5. Left atrial size was normal.   6. Right atrial size was normal.   7. The mitral valve is normal in structure. Mild mitral valve   regurgitation. Mild mitral stenosis.   8. The tricuspid valve is normal in structure. Tricuspid valve  regurgitation is mild.   9. The aortic valve is normal in structure. Aortic valve regurgitation  was not visualized by color flow Doppler. Structurally normal aortic  valve, with no evidence of sclerosis or stenosis.  10. Mild thickening and calcification of the aortic valve leaflets.  11. The pulmonic valve was normal in structure. Pulmonic valve  regurgitation is trivial by color flow Doppler.  12. Normal pulmonary artery systolic pressure.  13. The inferior vena cava is normal in size with greater than 50%  respiratory variability, suggesting right atrial pressure of 3 mmHg.   ASSESSMENT:    No diagnosis found.     PLAN:  In order of problems listed above:  Severe MR s/p mitral valve annuloplasty/repair:  F/U Echo 2020  showed an excellent repair.  Recommend routine SBE prophylaxis.  No murmur on exam  2.   History of PE now recurrent on 02/06/18. Submassive with RV strain. S/p EKOS and lytic therapy. On Xarelto now.  Will need anticoagulation indefinitely.   3.    Chronic diastolic heart failure: weight is up some and he has more pitting edema. .  He does have venous insuffieciency. Will continue lasix at current dose. I have  encouraged him to go ahead and start Jardiance.   Continue sodium restriction and support hose.   4.    S/p CABG x 1: LIMA to LAD  5.    Stage IV lung CA. S/p resection with recurrent malignant pleural effusion. On oral chemotherapy.  S/p RT for recurrent lung nodule.   6.   DM 2: Managed by primary care provider- start Jardiance.  7.   HLD off statin due to history of elevated CK on Lipitor. Focusing on lifestyle modification for now.   8. CKD stage 3a. Followed by Nephrology. Last creatinine 1.29.     Medication Adjustments/Labs and Tests Ordered: Current medicines are reviewed at length with the patient today.  Concerns regarding medicines are  outlined above.  Medication changes, Labs and Tests ordered today are listed in the Patient Instructions below. There are no Patient Instructions on file for this visit.   Signed, Kacelyn Rowzee Martinique, MD  07/25/2022 3:26 PM    [facility]  [address] [place], [place]  [address] Phone: [phone]; Fax: [phone]

## 2022-07-27 ENCOUNTER — Ambulatory Visit: Payer: Medicare PPO | Attending: Cardiology | Admitting: Cardiology

## 2022-07-27 ENCOUNTER — Encounter: Payer: Self-pay | Admitting: Cardiology

## 2022-07-27 VITALS — BP 124/75 | HR 82 | Ht 67.0 in | Wt 232.0 lb

## 2022-07-27 DIAGNOSIS — E785 Hyperlipidemia, unspecified: Secondary | ICD-10-CM | POA: Diagnosis not present

## 2022-07-27 DIAGNOSIS — I2510 Atherosclerotic heart disease of native coronary artery without angina pectoris: Secondary | ICD-10-CM

## 2022-07-27 DIAGNOSIS — I2699 Other pulmonary embolism without acute cor pulmonale: Secondary | ICD-10-CM | POA: Diagnosis not present

## 2022-07-27 DIAGNOSIS — Z9889 Other specified postprocedural states: Secondary | ICD-10-CM

## 2022-07-27 DIAGNOSIS — N1831 Chronic kidney disease, stage 3a: Secondary | ICD-10-CM

## 2022-07-27 DIAGNOSIS — I5032 Chronic diastolic (congestive) heart failure: Secondary | ICD-10-CM

## 2022-07-27 DIAGNOSIS — C3491 Malignant neoplasm of unspecified part of right bronchus or lung: Secondary | ICD-10-CM

## 2022-07-27 NOTE — Patient Instructions (Signed)
Medication Instructions:  Continue same medications *If you need a refill on your cardiac medications before your next appointment, please call your pharmacy*   Lab Work: None  ordered   Testing/Procedures: None ordered   Follow-Up: At [facility], you and your health needs are our priority.  As part of our continuing mission to provide you with exceptional heart care, we have created designated Provider Care Teams.  These Care Teams include your primary Cardiologist (physician) and Advanced Practice Providers (APPs -  Physician Assistants and Nurse Practitioners) who all work together to provide you with the care you need, when you need it.  We recommend signing up for the patient portal called "MyChart".  Sign up information is provided on this After Visit Summary.  MyChart is used to connect with patients for Virtual Visits (Telemedicine).  Patients are able to view lab/test results, encounter notes, upcoming appointments, etc.  Non-urgent messages can be sent to your provider as well.   To learn more about what you can do with MyChart, go to [email or web address].    Your next appointment:  6 months   Call in May to schedule Sept or Oct appointment     Provider:  Dr.Jordan   Schedule appointment with Pharmacist in [facility]

## 2022-07-28 ENCOUNTER — Other Ambulatory Visit (HOSPITAL_COMMUNITY): Payer: Self-pay

## 2022-08-04 ENCOUNTER — Other Ambulatory Visit: Payer: Self-pay

## 2022-08-05 ENCOUNTER — Other Ambulatory Visit: Payer: Self-pay

## 2022-08-07 ENCOUNTER — Other Ambulatory Visit (HOSPITAL_COMMUNITY): Payer: Self-pay

## 2022-08-17 DIAGNOSIS — E1122 Type 2 diabetes mellitus with diabetic chronic kidney disease: Secondary | ICD-10-CM | POA: Diagnosis not present

## 2022-08-17 DIAGNOSIS — N2581 Secondary hyperparathyroidism of renal origin: Secondary | ICD-10-CM | POA: Diagnosis not present

## 2022-08-17 DIAGNOSIS — D631 Anemia in chronic kidney disease: Secondary | ICD-10-CM | POA: Diagnosis not present

## 2022-08-17 DIAGNOSIS — N1831 Chronic kidney disease, stage 3a: Secondary | ICD-10-CM | POA: Diagnosis not present

## 2022-08-17 DIAGNOSIS — I129 Hypertensive chronic kidney disease with stage 1 through stage 4 chronic kidney disease, or unspecified chronic kidney disease: Secondary | ICD-10-CM | POA: Diagnosis not present

## 2022-08-19 LAB — LAB REPORT - SCANNED: EGFR: 60

## 2022-08-26 ENCOUNTER — Ambulatory Visit: Payer: Medicare PPO

## 2022-08-27 ENCOUNTER — Ambulatory Visit: Payer: Medicare PPO | Attending: Cardiology | Admitting: Student

## 2022-08-27 ENCOUNTER — Telehealth: Payer: Self-pay | Admitting: Pharmacist

## 2022-08-27 ENCOUNTER — Encounter: Payer: Self-pay | Admitting: Student

## 2022-08-27 DIAGNOSIS — E785 Hyperlipidemia, unspecified: Secondary | ICD-10-CM | POA: Insufficient documentation

## 2022-08-27 NOTE — Patient Instructions (Addendum)
Your Results:             Your most recent labs Goal  Total Cholesterol 216 < 200  Triglycerides 97 < 150  HDL (happy/good cholesterol) 58.4 > 40  LDL (lousy/bad cholesterol 139 < 55   Medication changes: We will start the process to get PSCK9i (Praluent and Repatha) covered by your insurance.  Once the prior authorization is complete, we will call you to let you know and confirm pharmacy information.     Praluent is a cholesterol medication that improved your body's ability to get rid of "bad cholesterol" known as LDL. It can lower your LDL up to 60%. It is an injection that is given under the skin every 2 weeks. The most common side effects of Praluent include runny nose, symptoms of the common cold, rarely flu or flu-like symptoms, back/muscle pain in about 3-4% of the patients, and redness, pain, or bruising at the injection site.    Repatha is a cholesterol medication that improved your body's ability to get rid of "bad cholesterol" known as LDL. It can lower your LDL up to 60%! It is an injection that is given under the skin every 2 weeks. The medication often requires a prior authorization from your insurance company. We will take care of submitting all the necessary information to your insurance company to get it approved. The most common side effects of Repatha include runny nose, symptoms of the common cold, rarely flu or flu-like symptoms, back/muscle pain in about 3-4% of the patients, and redness, pain, or bruising at the injection site.   Lab orders: We want to repeat labs after 2-3 months.  We will send you a lab order to remind you once we get closer to that time.        Copay Assistance:  The [employer] foundation offers assistance to help pay for medication copays.  They will cover copays for all cholesterol lowering meds, including statins, fibrates, omega-3 oils, ezetimibe, Repatha, Praluent, Nexletol, Nexlizet.  The cards are usually good for $2,500 or 12 months, whichever  comes first. Go to healthwellfoundation.org Click on "Apply Now" Answer questions as to whom is applying (patient or representative) Your disease fund will be "hypercholesterolemia - Medicare access" Select the cholesterol medication you need assistance with (Repatha, Praluent, Nexlizet...) They will ask question about qualifying diagnosis - you can mark "yes"; and do you have insurance coverage.   When they ask what type of assistance you are interested in - "copay assistance" When you submit, the approval is usually within minutes.  You will need to print the card information from the site You will need to show this information to your pharmacy, they will bill your Medicare Part D plan first -then bill [employer] --for the copay.   You can also call them at [phone], although the hold times can be quite long.   Carmela Hurt, Pharm.D [facility] A Division of [facility] [address] [place], [place] [address]  Phone: [phone]; Fax: [phone]

## 2022-08-27 NOTE — Assessment & Plan Note (Signed)
Assessment:  LDL goal: < 55 mg/dl last LDLc 211  mg/dl (15/5208) Tolerates Zetia  well without any side effects  Intolerance to statins - sever muscle aches and elevated CPK  Risk Factors: DM II,  chronic diastolic HF, CAD s/p CABGx1, CKD stage 3 Discussed next potential options (PCSK-9 inhibitors, bempedoic acid and inclisiran); cost, dosing efficacy, side effects    Plan: Continue taking current medications ezetimibe  Will apply for PA for PCSK9i; will inform patient upon approval (prefers MyChart message) Lipid lab due in 2-3 months after starting [facility]

## 2022-08-27 NOTE — Progress Notes (Signed)
Patient ID: ASMAR BROZEK                 DOB: May 11, 1951                    MRN: [id number]      HPI: James [name] is a 72 y.o. male patient referred to lipid clinic by Dr.Jordan PMH is significant for DM II, HLD, and severe MR, stage IV lung cancer, chronic diastolic HF, CAD s/p CABGx1, CKD stage 3  Patient presented today with his wife for lipid clinic. He was concerned about elevation of A1c from cholesterol medications. Reports he has not started taking Jardiance yet as he is concerned about side effects. Discussed how Jardiance works, potential side effects and its management. Patient had sever muscle aches and elevation of CPK. He does not recall what strengths of the various statins he tried but he had tried rosuvastatin and atorvastatin in the past. He is able to tolerate ezetimibe well without side effects.  Reviewed options for lowering LDL cholesterol, including ezetimibe, PCSK-9 inhibitors, bempedoic acid and inclisiran.  Discussed mechanisms of action, dosing, side effects and potential decreases in LDL cholesterol.  Also reviewed cost information and potential options for patient assistance.  Current Medications: ezetimibe 10 mg daily  Intolerances: atorvastatin and rosuvastatin : elevated CK and myalgia   Risk Factors: DM II,  chronic diastolic HF, CAD s/p CABGx1, CKD stage 3 LDL goal: <55 mg/dl   Diet: fairly healthy  Breakfast: spinach muffins, egg sandwich Lunch: none  Dinner: salads - with home made dressing , backed and grilled chicken  Exercise: none due to chronic back pain   Family History:  Father: MI at age 71  Mother: ASCVD  digestive trac atherosclerosis at age of 44  Sister: damaged aortic valve  Social History:  Alcohol: occasional Smoking: never    Labs: Lipid Panel     Component Value Date/Time   CHOL 216 (H) 03/18/2022 1557   CHOL 196 01/22/2021 0829   TRIG 97.0 03/18/2022 1557   TRIG 108 05/20/2006 0825   HDL 58.40 03/18/2022 1557   HDL 45  01/22/2021 0829   CHOLHDL 4 03/18/2022 1557   VLDL 19.4 03/18/2022 1557   LDLCALC 139 (H) 03/18/2022 1557   LDLCALC 123 (H) 01/22/2021 0829   LDLCALC 136 (H) 12/26/2019 1150   LDLDIRECT 177.0 07/24/2015 1052   LABVLDL 28 01/22/2021 0829    Past Medical History:  Diagnosis Date   Adenocarcinoma of right lung, stage 1 (HCC) 09/06/2016   Anxiety    Arthritis    "knees, hips, back" (10/19/2012)   Chronic diastolic congestive heart failure (HCC)    Chronic lower back pain    Colon polyps    10/27/2002, repeat letter 09/17/2007   Coronary artery disease    Coronary artery disease involving native coronary artery of native heart without angina pectoris    Depressive disorder, not elsewhere classified    no meds   Diabetes mellitus without complication (HCC)    diet controlled- no med  (while in hosp 4/18 -elevated cbg   Dyspnea    Fasting hyperglycemia    GERD (gastroesophageal reflux disease)    Heart murmur    Hemoptysis    abnormal CT Chest 01/29/10 - ? new GG changes RUL > not viz on plain cxr 02/26/2010   Hypertension    Mitral regurgitation    severe MR 08/2016   MPN (myeloproliferative neoplasm) (HCC)    1st detected 06/04/1998  Obesity    OSA on CPAP    last sleep study 10 years ago   Other and unspecified hyperlipidemia    Peripheral vascular disease (HCC) 08/2016   after lung surgey small clots in lungs,after hip dvt-5/16   Pneumonia    4/18   Positive PPD 1965   "non reactive in 2012" (10/19/2012)   Routine general medical examination at a health care facility    S/P CABG x 1 03/24/2017   LIMA to LAD   S/P mitral valve repair 03/24/2017   Complex valvuloplasty including artificial Gore-tex neochord placement x6 and 28 mm Sorin Annuloflex posterior annuloplasty band   Special screening for malignant neoplasm of prostate    Spinal stenosis, unspecified region other than cervical    Wrist pain, left     Current Outpatient Medications on File Prior to Visit   Medication Sig Dispense Refill   clindamycin (CLEOCIN-T) 1 % external solution Apply topically 2 (two) times daily. 60 mL 0   empagliflozin (JARDIANCE) 10 MG TABS tablet Take 1 tablet (10 mg total) by mouth daily before breakfast. 90 tablet 1   ezetimibe (ZETIA) 10 MG tablet TAKE 1 TABLET BY MOUTH EVERY DAY 90 tablet 3   furosemide (LASIX) 20 MG tablet TAKE 1 TABLET BY MOUTH EVERY DAY 90 tablet 2   LORazepam (ATIVAN) 0.5 MG tablet 1 tablet p.o. 30 minutes before the MRI.  May repeat once if needed. 2 tablet 0   methocarbamol (ROBAXIN) 500 MG tablet Take 1 tablet (500 mg total) by mouth every 8 (eight) hours as needed for muscle spasms. 270 tablet 1   metoprolol tartrate (LOPRESSOR) 25 MG tablet TAKE 0.5 TABLETS BY MOUTH 2 TIMES DAILY. 90 tablet 3   Multiple Vitamin (MULTIVITAMIN) tablet Take 1 tablet by mouth daily.     Omega-3 Fatty Acids (FISH OIL) 500 MG CAPS Take 500 mg by mouth daily.      osimertinib mesylate (TAGRISSO) 80 MG tablet TAKE 1 TABLET (80 MG TOTAL) BY MOUTH DAILY. 30 tablet 2   sildenafil (REVATIO) 20 MG tablet Take 4 tablets (80 mg total) by mouth daily as needed. 60 tablet 5   XARELTO 20 MG TABS tablet TAKE 1 TABLET BY MOUTH DAILY WITH SUPPER. 90 tablet 3   No current facility-administered medications on file prior to visit.    Allergies  Allergen Reactions   Symbicort [Budesonide-Formoterol Fumarate] Other (See Comments) and Cough    Chest pain, wheezing    Amoxicillin Itching and Other (See Comments)    Has patient had a PCN reaction causing immediate rash, facial/tongue/throat swelling, SOB or lightheadedness with hypotension: No Has patient had a PCN reaction causing severe rash involving mucus membranes or skin necrosis: No Has patient had a PCN reaction that required hospitalization No Has patient had a PCN reaction occurring within the last 10 years: No If all of the above answers are "NO", then may proceed with Cephalosporin use.     Assessment/Plan:  1.  Hyperlipidemia -  No problems updated. No problem-specific Assessment & Plan notes found for this encounter.    Thank you,  Carmela Hurt, Pharm.D [facility] of [facility] [address], [place], [place] [address]  Phone: [phone]; Fax: [phone]

## 2022-08-31 ENCOUNTER — Telehealth: Payer: Self-pay

## 2022-08-31 ENCOUNTER — Other Ambulatory Visit (HOSPITAL_COMMUNITY): Payer: Self-pay

## 2022-08-31 DIAGNOSIS — E785 Hyperlipidemia, unspecified: Secondary | ICD-10-CM

## 2022-08-31 MED ORDER — REPATHA SURECLICK 140 MG/ML ~~LOC~~ SOAJ: 140.0000 mg | SUBCUTANEOUS | 3 refills | Status: DC

## 2022-08-31 NOTE — Telephone Encounter (Signed)
Patient repots he has started taking Jardiance will start Repatha from next week. Follow up fasting lipid lab is due in 2-3 months

## 2022-08-31 NOTE — Addendum Note (Signed)
Addended by: Tylene Fantasia on: 08/31/2022 03:23 PM   Modules accepted: Orders

## 2022-08-31 NOTE — Telephone Encounter (Signed)
Pharmacy Patient Advocate Encounter  Prior Authorization for REPATHA has been approved.    Effective dates: 05/11/22 through 05/11/23     Received notification from [facility] that prior authorization for REPATHA is needed.    PA submitted on 08/31/22 Key [id number] Status is pending  Haze Rushing, CPhT Pharmacy Patient Advocate Specialist Direct Number: [phone] Fax: [phone]

## 2022-09-01 ENCOUNTER — Other Ambulatory Visit (HOSPITAL_COMMUNITY): Payer: Self-pay

## 2022-09-01 NOTE — Telephone Encounter (Signed)
PA for PCSK9i initiated

## 2022-09-24 ENCOUNTER — Other Ambulatory Visit (HOSPITAL_COMMUNITY): Payer: Self-pay

## 2022-09-24 ENCOUNTER — Other Ambulatory Visit: Payer: Self-pay | Admitting: Physician Assistant

## 2022-09-24 DIAGNOSIS — C3491 Malignant neoplasm of unspecified part of right bronchus or lung: Secondary | ICD-10-CM

## 2022-09-25 ENCOUNTER — Other Ambulatory Visit (HOSPITAL_COMMUNITY): Payer: Self-pay

## 2022-09-25 ENCOUNTER — Other Ambulatory Visit: Payer: Self-pay

## 2022-09-25 MED ORDER — OSIMERTINIB MESYLATE 80 MG PO TABS
ORAL_TABLET | Freq: Every day | ORAL | 2 refills | Status: DC
  Filled 2022-09-25: qty 30, 30d supply, fill #0
  Filled 2022-10-22: qty 30, 30d supply, fill #1
  Filled 2022-11-26: qty 30, 30d supply, fill #2

## 2022-09-28 ENCOUNTER — Ambulatory Visit (HOSPITAL_COMMUNITY)
Admission: RE | Admit: 2022-09-28 | Discharge: 2022-09-28 | Disposition: A | Discharge status: Home / Self Care | Payer: Medicare PPO | Source: Ambulatory Visit | Attending: Internal Medicine | Admitting: Internal Medicine

## 2022-09-28 ENCOUNTER — Inpatient Hospital Stay: Payer: Medicare PPO | Admitting: Licensed Clinical Social Worker

## 2022-09-28 ENCOUNTER — Other Ambulatory Visit: Payer: Self-pay | Admitting: Medical Oncology

## 2022-09-28 ENCOUNTER — Inpatient Hospital Stay: Payer: Medicare PPO | Attending: Internal Medicine

## 2022-09-28 DIAGNOSIS — R918 Other nonspecific abnormal finding of lung field: Secondary | ICD-10-CM | POA: Diagnosis not present

## 2022-09-28 DIAGNOSIS — C3491 Malignant neoplasm of unspecified part of right bronchus or lung: Secondary | ICD-10-CM

## 2022-09-28 DIAGNOSIS — C3490 Malignant neoplasm of unspecified part of unspecified bronchus or lung: Secondary | ICD-10-CM

## 2022-09-28 DIAGNOSIS — J90 Pleural effusion, not elsewhere classified: Secondary | ICD-10-CM | POA: Diagnosis not present

## 2022-09-28 DIAGNOSIS — Z79899 Other long term (current) drug therapy: Secondary | ICD-10-CM | POA: Insufficient documentation

## 2022-09-28 DIAGNOSIS — Z902 Acquired absence of lung [part of]: Secondary | ICD-10-CM | POA: Insufficient documentation

## 2022-09-28 DIAGNOSIS — F409 Phobic anxiety disorder, unspecified: Secondary | ICD-10-CM

## 2022-09-28 DIAGNOSIS — K760 Fatty (change of) liver, not elsewhere classified: Secondary | ICD-10-CM | POA: Diagnosis not present

## 2022-09-28 DIAGNOSIS — C3411 Malignant neoplasm of upper lobe, right bronchus or lung: Secondary | ICD-10-CM | POA: Insufficient documentation

## 2022-09-28 DIAGNOSIS — M549 Dorsalgia, unspecified: Secondary | ICD-10-CM | POA: Insufficient documentation

## 2022-09-28 DIAGNOSIS — G8929 Other chronic pain: Secondary | ICD-10-CM | POA: Insufficient documentation

## 2022-09-28 DIAGNOSIS — K5730 Diverticulosis of large intestine without perforation or abscess without bleeding: Secondary | ICD-10-CM | POA: Diagnosis not present

## 2022-09-28 LAB — CBC WITH DIFFERENTIAL (CANCER CENTER ONLY)
Abs Immature Granulocytes: 0.04 10*3/uL (ref 0.00–0.07)
Basophils Absolute: 0 10*3/uL (ref 0.0–0.1)
Basophils Relative: 1 %
Eosinophils Absolute: 0.1 10*3/uL (ref 0.0–0.5)
Eosinophils Relative: 2 %
HCT: 44.4 % (ref 39.0–52.0)
Hemoglobin: 15 g/dL (ref 13.0–17.0)
Immature Granulocytes: 1 %
Lymphocytes Relative: 28 %
Lymphs Abs: 1.8 10*3/uL (ref 0.7–4.0)
MCH: 31.4 pg (ref 26.0–34.0)
MCHC: 33.8 g/dL (ref 30.0–36.0)
MCV: 93.1 fL (ref 80.0–100.0)
Monocytes Absolute: 0.8 10*3/uL (ref 0.1–1.0)
Monocytes Relative: 12 %
Neutro Abs: 3.7 10*3/uL (ref 1.7–7.7)
Neutrophils Relative %: 56 %
Platelet Count: 147 10*3/uL — ABNORMAL LOW (ref 150–400)
RBC: 4.77 MIL/uL (ref 4.22–5.81)
RDW: 14.8 % (ref 11.5–15.5)
WBC Count: 6.5 10*3/uL (ref 4.0–10.5)
nRBC: 0 % (ref 0.0–0.2)

## 2022-09-28 LAB — CMP (CANCER CENTER ONLY)
ALT: 21 U/L (ref 0–44)
AST: 21 U/L (ref 15–41)
Albumin: 4.1 g/dL (ref 3.5–5.0)
Alkaline Phosphatase: 54 U/L (ref 38–126)
Anion gap: 7 (ref 5–15)
BUN: 22 mg/dL (ref 8–23)
CO2: 22 mmol/L (ref 22–32)
Calcium: 9 mg/dL (ref 8.9–10.3)
Chloride: 111 mmol/L (ref 98–111)
Creatinine: 1.52 mg/dL — ABNORMAL HIGH (ref 0.61–1.24)
GFR, Estimated: 48 mL/min — ABNORMAL LOW (ref >60)
Glucose, Bld: 110 mg/dL — ABNORMAL HIGH (ref 70–99)
Potassium: 3.9 mmol/L (ref 3.5–5.1)
Sodium: 140 mmol/L (ref 135–145)
Total Bilirubin: 0.5 mg/dL (ref 0.3–1.2)
Total Protein: 6.5 g/dL (ref 6.5–8.1)

## 2022-09-28 MED ORDER — IOHEXOL 300 MG/ML  SOLN
100.0000 mL | Freq: Once | INTRAMUSCULAR | Status: AC | PRN
  Administered 2022-09-28: 100 mL via INTRAVENOUS

## 2022-09-28 NOTE — Progress Notes (Signed)
Social worker referral sent.  

## 2022-09-28 NOTE — Progress Notes (Signed)
[facility] CSW Progress Note  Visual merchandiser  spoke with pt's spouse regarding concerns about pt's overall mood and engagement in daily activities.  CSW spoke at length with spouse regarding options to assess and address concerns.  Spouse reports pt is experiencing a great deal of back pain which is impacting his overall well being.  Pt scheduled for staging scans on 5/20 and will see Dr. Arbutus Ped on 5/23 to review.  CSW suggested a referral be made to palliative care to better address symptoms to which pt's spouse verbalized agreement.  RN informed.  CSW suggested pt and spouse meet w/ CSW to assess concerns in person following the results of the staging scans.  Appointment scheduled for 5/29.        Rachel Moulds, LCSW Clinical Social Worker [facility]

## 2022-09-30 ENCOUNTER — Ambulatory Visit: Payer: Medicare PPO | Admitting: Internal Medicine

## 2022-10-01 ENCOUNTER — Inpatient Hospital Stay: Payer: Medicare PPO | Admitting: Internal Medicine

## 2022-10-01 VITALS — BP 137/69 | HR 88 | Temp 99.2°F | Resp 16 | Wt 229.8 lb

## 2022-10-01 DIAGNOSIS — C3411 Malignant neoplasm of upper lobe, right bronchus or lung: Secondary | ICD-10-CM | POA: Diagnosis not present

## 2022-10-01 DIAGNOSIS — C3491 Malignant neoplasm of unspecified part of right bronchus or lung: Secondary | ICD-10-CM

## 2022-10-01 DIAGNOSIS — Z79899 Other long term (current) drug therapy: Secondary | ICD-10-CM | POA: Diagnosis not present

## 2022-10-01 DIAGNOSIS — Z902 Acquired absence of lung [part of]: Secondary | ICD-10-CM | POA: Diagnosis not present

## 2022-10-01 DIAGNOSIS — G8929 Other chronic pain: Secondary | ICD-10-CM | POA: Diagnosis not present

## 2022-10-01 DIAGNOSIS — M549 Dorsalgia, unspecified: Secondary | ICD-10-CM | POA: Diagnosis not present

## 2022-10-01 NOTE — Progress Notes (Signed)
[facility] [phone]   Fax:([phone]  OFFICE PROGRESS NOTE  Etta Grandchild, MD [address] [place] [address]  DIAGNOSIS: Recurrent non-small cell lung cancer, adenocarcinoma initially diagnosed as stage IA (T1c, N0, M0) non-small cell lung cancer, adenocarcinoma presented with right upper lobe lung nodule in April 2018 with recurrence in June 2019.  Biomarker Findings Microsatellite status - MS-Stable Tumor Mutational Burden - TMB-Low (3 Muts/Mb) Genomic Findings For a complete list of the genes assayed, please refer to the Appendix. EGFR exon 19 deletion ([id number]>O) CDKN2A/B loss NKX2-1 amplification 7 Disease relevant genes with no reportable alterations: KRAS, ALK, BRAF, MET, RET, ERBB2, ROS1   PRIOR THERAPY:  1) Status post right upper lobectomy with lymph node dissection under the care of Dr. Dorris Fetch on 09/02/2016. 2) status post palliative radiotherapy to enlarging right upper lobe lung nodule under the care of Dr. Mitzi Hansen completed on Oct 07, 2021.  CURRENT THERAPY: Tagrisso 80 mg p.o. daily.  First dose started November 05, 2017.  Status post 54 months of treatment.  INTERVAL HISTORY: James [name] 72 y.o. male returns to the clinic today for follow-up visit accompanied by his wife.  The patient is feeling fine today with no concerning complaints of except for the chronic back pain.  He denied having any chest pain, shortness of breath, cough or hemoptysis.  He has no nausea, vomiting, diarrhea or constipation.  He denied having any headache or visual changes.  He is here today for evaluation with repeat CT scan of the chest, abdomen and pelvis for restaging of his disease.  MEDICAL HISTORY: Past Medical History:  Diagnosis Date   Adenocarcinoma of right lung, stage 1 (HCC) 09/06/2016   Anxiety    Arthritis    "knees, hips, back" (10/19/2012)   Chronic diastolic congestive heart failure (HCC)    Chronic lower back pain     Colon polyps    10/27/2002, repeat letter 09/17/2007   Coronary artery disease    Coronary artery disease involving native coronary artery of native heart without angina pectoris    Depressive disorder, not elsewhere classified    no meds   Diabetes mellitus without complication (HCC)    diet controlled- no med  (while in hosp 4/18 -elevated cbg   Dyspnea    Fasting hyperglycemia    GERD (gastroesophageal reflux disease)    Heart murmur    Hemoptysis    abnormal CT Chest 01/29/10 - ? new GG changes RUL > not viz on plain cxr 02/26/2010   Hypertension    Mitral regurgitation    severe MR 08/2016   MPN (myeloproliferative neoplasm) (HCC)    1st detected 06/04/1998   Obesity    OSA on CPAP    last sleep study 10 years ago   Other and unspecified hyperlipidemia    Peripheral vascular disease (HCC) 08/2016   after lung surgey small clots in lungs,after hip dvt-5/16   Pneumonia    4/18   Positive PPD 1965   "non reactive in 2012" (10/19/2012)   Routine general medical examination at a health care facility    S/P CABG x 1 03/24/2017   LIMA to LAD   S/P mitral valve repair 03/24/2017   Complex valvuloplasty including artificial Gore-tex neochord placement x6 and 28 mm Sorin Annuloflex posterior annuloplasty band   Special screening for malignant neoplasm of prostate    Spinal stenosis, unspecified region other than cervical    Wrist pain,  left     ALLERGIES:  is allergic to symbicort [budesonide-formoterol fumarate] and amoxicillin.  MEDICATIONS:  Current Outpatient Medications  Medication Sig Dispense Refill   clindamycin (CLEOCIN-T) 1 % external solution Apply topically 2 (two) times daily. 60 mL 0   empagliflozin (JARDIANCE) 10 MG TABS tablet Take 1 tablet (10 mg total) by mouth daily before breakfast. 90 tablet 1   Evolocumab (REPATHA SURECLICK) 140 MG/ML SOAJ Inject 140 mg into the skin every 14 (fourteen) days. 6 mL 3   ezetimibe (ZETIA) 10 MG tablet TAKE 1 TABLET BY MOUTH  EVERY DAY 90 tablet 3   furosemide (LASIX) 20 MG tablet TAKE 1 TABLET BY MOUTH EVERY DAY 90 tablet 2   LORazepam (ATIVAN) 0.5 MG tablet 1 tablet p.o. 30 minutes before the MRI.  May repeat once if needed. 2 tablet 0   methocarbamol (ROBAXIN) 500 MG tablet Take 1 tablet (500 mg total) by mouth every 8 (eight) hours as needed for muscle spasms. 270 tablet 1   metoprolol tartrate (LOPRESSOR) 25 MG tablet TAKE 0.5 TABLETS BY MOUTH 2 TIMES DAILY. 90 tablet 3   Multiple Vitamin (MULTIVITAMIN) tablet Take 1 tablet by mouth daily.     Omega-3 Fatty Acids (FISH OIL) 500 MG CAPS Take 500 mg by mouth daily.      osimertinib mesylate (TAGRISSO) 80 MG tablet TAKE 1 TABLET (80 MG TOTAL) BY MOUTH DAILY. 30 tablet 2   sildenafil (REVATIO) 20 MG tablet Take 4 tablets (80 mg total) by mouth daily as needed. 60 tablet 5   XARELTO 20 MG TABS tablet TAKE 1 TABLET BY MOUTH DAILY WITH SUPPER. 90 tablet 3   No current facility-administered medications for this visit.    SURGICAL HISTORY:  Past Surgical History:  Procedure Laterality Date   ANTERIOR CRUCIATE LIGAMENT REPAIR Left 1967   CARDIAC CATHETERIZATION  2000   CHEST TUBE INSERTION Right 10/19/2012   post bronch   COLONOSCOPY W/ POLYPECTOMY     CORONARY ARTERY BYPASS GRAFT N/A 03/24/2017   Procedure: CORONARY ARTERY BYPASS GRAFTING (CABG)x1 using left internal mammary artery, LIMA-LAD;  Surgeon: Purcell Nails, MD;  Location: [facility] OR;  Service: Open Heart Surgery;  Laterality: N/A;   CORONARY PRESSURE/FFR STUDY N/A 08/11/2016   Procedure: Intravascular Pressure Wire/FFR Study;  Surgeon: Peter M [place], MD;  Location: [facility] INVASIVE CV LAB;  Service: Cardiovascular;  Laterality: N/A;   FLEXIBLE BRONCHOSCOPY  10/19/2012   Flexible video fiberoptic bronchoscopy with electromagnetic navigation and biopsies.   IR ANGIOGRAM PULMONARY BILATERAL SELECTIVE  02/06/2018   IR ANGIOGRAM SELECTIVE EACH ADDITIONAL VESSEL  02/06/2018   IR ANGIOGRAM SELECTIVE EACH ADDITIONAL  VESSEL  02/06/2018   IR INFUSION THROMBOL ARTERIAL INITIAL (MS)  02/06/2018   IR INFUSION THROMBOL ARTERIAL INITIAL (MS)  02/06/2018   IR THROMB F/U EVAL ART/VEN FINAL DAY (MS)  02/07/2018   IR US GUIDE VASC ACCESS RIGHT  02/06/2018   LOBECTOMY Right 09/02/2016   Procedure: RIGHT UPPER LOBECTOMY;  Surgeon: Loreli Slot, MD;  Location: [facility] OR;  Service: Thoracic;  Laterality: Right;   LYMPH NODE DISSECTION Right 09/02/2016   Procedure: LYMPH NODE DISSECTION, RIGHT LUNG;  Surgeon: Loreli Slot, MD;  Location: [facility] OR;  Service: Thoracic;  Laterality: Right;   MITRAL VALVE REPAIR N/A 03/24/2017   Procedure: MITRAL VALVE REPAIR (MVR) with Sorin [employer]omed[employer] Annuloflex size 28;  Surgeon: Purcell Nails, MD;  Location: [facility] OR;  Service: Open Heart Surgery;  Laterality: N/A;   RIGHT/LEFT HEART CATH AND  CORONARY ANGIOGRAPHY N/A 08/11/2016   Procedure: Right/Left Heart Cath and Coronary Angiography;  Surgeon: Peter M [place], MD;  Location: [facility] INVASIVE CV LAB;  Service: Cardiovascular;  Laterality: N/A;   TEE WITHOUT CARDIOVERSION N/A 07/17/2016   Procedure: TRANSESOPHAGEAL ECHOCARDIOGRAM (TEE);  Surgeon: Chrystie Nose, MD;  Location: [facility] ENDOSCOPY;  Service: Cardiovascular;  Laterality: N/A;   TEE WITHOUT CARDIOVERSION N/A 03/24/2017   Procedure: TRANSESOPHAGEAL ECHOCARDIOGRAM (TEE);  Surgeon: Purcell Nails, MD;  Location: [facility] OR;  Service: Open Heart Surgery;  Laterality: N/A;   TONSILLECTOMY  1950's   TOTAL HIP ARTHROPLASTY Left 10/05/2014   dr Despina Hick   TOTAL HIP ARTHROPLASTY Left 10/05/2014   Procedure: LEFT TOTAL HIP ARTHROPLASTY ANTERIOR APPROACH;  Surgeon: Ollen Gross, MD;  Location: [facility] OR;  Service: Orthopedics;  Laterality: Left;   VIDEO ASSISTED THORACOSCOPY (VATS)/WEDGE RESECTION Right 09/02/2016   Procedure: VIDEO ASSISTED THORACOSCOPY (VATS)/RIGHT UPPER LOBE WEDGE RESECTION;  Surgeon: Loreli Slot, MD;  Location: [facility] OR;  Service: Thoracic;  Laterality: Right;   VIDEO  BRONCHOSCOPY WITH ENDOBRONCHIAL NAVIGATION N/A 10/19/2012   Procedure: VIDEO BRONCHOSCOPY WITH ENDOBRONCHIAL NAVIGATION;  Surgeon: Leslye Peer, MD;  Location: [facility] OR;  Service: Thoracic;  Laterality: N/A;   WRIST RECONSTRUCTION Left 12/2009   'proximal row carpectomy" [name]    REVIEW OF SYSTEMS:  Constitutional: negative Eyes: negative Ears, nose, mouth, throat, and face: negative Respiratory: negative Cardiovascular: negative Gastrointestinal: negative Genitourinary:negative Integument/breast: negative Hematologic/lymphatic: negative Musculoskeletal:positive for back pain Neurological: negative Behavioral/Psych: negative Endocrine: negative Allergic/Immunologic: negative   PHYSICAL EXAMINATION: General appearance: alert, cooperative, fatigued, and no distress Head: Normocephalic, without obvious abnormality, atraumatic Neck: no adenopathy, no JVD, supple, symmetrical, trachea midline, and thyroid not enlarged, symmetric, no tenderness/mass/nodules Lymph nodes: Cervical, supraclavicular, and axillary nodes normal. Resp: clear to auscultation bilaterally Back: symmetric, no curvature. ROM normal. No CVA tenderness. Cardio: regular rate and rhythm, S1, S2 normal, no murmur, click, rub or gallop GI: soft, non-tender; bowel sounds normal; no masses,  no organomegaly Extremities: extremities normal, atraumatic, no cyanosis or edema Neurologic: Alert and oriented X 3, normal strength and tone. Normal symmetric reflexes. Normal coordination and gait  ECOG PERFORMANCE STATUS: 1 - Symptomatic but completely ambulatory  Blood pressure 137/69, pulse 88, temperature 99.2 F (37.3 C), temperature source Oral, resp. rate 16, weight 229 lb 12.8 oz (104.2 kg), SpO2 95 %.  LABORATORY DATA: Lab Results  Component Value Date   WBC 6.5 09/28/2022   HGB 15.0 09/28/2022   HCT 44.4 09/28/2022   MCV 93.1 09/28/2022   PLT 147 (L) 09/28/2022      Chemistry      Component Value Date/Time   NA  140 09/28/2022 0858   NA 142 10/01/2016 1534   K 3.9 09/28/2022 0858   K 4.2 10/01/2016 1534   CL 111 09/28/2022 0858   CO2 22 09/28/2022 0858   CO2 28 10/01/2016 1534   BUN 22 09/28/2022 0858   BUN 15.9 10/01/2016 1534   CREATININE 1.52 (H) 09/28/2022 0858   CREATININE 1.10 02/08/2017 1101   CREATININE 1.5 (H) 10/01/2016 1534      Component Value Date/Time   CALCIUM 9.0 09/28/2022 0858   CALCIUM 9.6 10/01/2016 1534   ALKPHOS 54 09/28/2022 0858   ALKPHOS 65 10/01/2016 1534   AST 21 09/28/2022 0858   AST 20 10/01/2016 1534   ALT 21 09/28/2022 0858   ALT 24 10/01/2016 1534   BILITOT 0.5 09/28/2022 0858   BILITOT 0.46 10/01/2016 1534  RADIOGRAPHIC STUDIES: CT Chest W Contrast  Result Date: 09/29/2022 CLINICAL DATA:  Non small cell lung cancer staging. * Tracking Code: BO * EXAM: CT CHEST, ABDOMEN, AND PELVIS WITH CONTRAST TECHNIQUE: Multidetector CT imaging of the chest, abdomen and pelvis was performed following the standard protocol during bolus administration of intravenous contrast. RADIATION DOSE REDUCTION: This exam was performed according to the departmental dose-optimization program which includes automated exposure control, adjustment of the mA and/or kV according to patient size and/or use of iterative reconstruction technique. CONTRAST:  OMNIPAQUE IOHEXOL 300 MG/ML  SOLN COMPARISON:  CT 04/28/2022 and older FINDINGS: CT CHEST FINDINGS Cardiovascular: Status post median sternotomy. The heart nonenlarged. No pericardial effusion. Calcifications are seen in the area of the aortic valve and coronary arteries. The thoracic aorta has a normal course and caliber with mild atherosclerotic plaque. Mediastinum/Nodes: Preserved small thyroid gland. Normal caliber thoracic esophagus. Surgical clips in the right axillary region. No specific abnormal lymph node enlargement identified in the axillary regions, hilum or mediastinum. Lungs/Pleura: Surgical changes from right-sided  lobectomy. Areas of scarring and fibrotic changes pleural thickening and a tiny right pleural effusion, similar to previous. The ill-defined opacity identified right perihilar with distortion tissue is stable abutting the suture line on series 507, image 87. Overall simple attention on follow-up for the patient's neoplasm. Left lung is grossly clear without consolidation, pneumothorax or effusion. The tiny sub 5 mm left upper lobe nodules are stable on series 507, image 45 and 47. Stable area of pleural thickening and nodularity along the lateral left lower lobe on series 507, image 124 as well. Musculoskeletal: Curvature of the spine with scattered degenerative changes. CT ABDOMEN PELVIS FINDINGS Hepatobiliary: Fatty liver infiltration. Stable benign-appearing hepatic cystic lesions. Patent portal vein. Gallbladder is nondilated. Features. Pancreas: Unremarkable. No pancreatic ductal dilatation or surrounding inflammatory changes. Spleen: Normal in size without focal abnormality.  Small splenule. Adrenals/Urinary Tract: Duplicated left renal collecting system. No enhancing renal mass or collecting system dilatation. Parapelvic cysts in the left kidney. Small lower pole parenchymal Bosniak 1 cyst is also stable. No specific imaging follow-up. Preserved adrenal glands. Preserved contours of the urinary bladder. Slight wall thickening but there is an enlarged prostate. Stomach/Bowel: Scattered colonic stool. Few left-sided colonic diverticula. Normal appendix. The stomach, small bowel and large bowel are nondilated. Vascular/Lymphatic: Aortic atherosclerosis. No enlarged abdominal or pelvic lymph nodes. Reproductive: Enlarged prostate with mass effect along the base of the bladder. Other: Small fat containing inguinal hernias. No free air or free fluid. Musculoskeletal: Streak artifact related to the patient's left hip arthroplasty. Scattered degenerative changes of the spine and pelvis. Trace retrolisthesis of L2 on  L3. Diffuse bridging syndesmophytes and osteophytes. IMPRESSION: No significant interval change. No new mass lesion, fluid collection or lymph node enlargement. Stable surgical changes in the right hemithorax with a tiny effusion, parenchymal opacity and areas of pleural thickening. Mild fatty liver infiltration. Few colonic diverticula. Enlarged prostate. Electronically Signed   By: Karen Kays M.D.   On: 09/29/2022 14:10   CT Abdomen Pelvis W Contrast  Result Date: 09/29/2022 CLINICAL DATA:  Non small cell lung cancer staging. * Tracking Code: BO * EXAM: CT CHEST, ABDOMEN, AND PELVIS WITH CONTRAST TECHNIQUE: Multidetector CT imaging of the chest, abdomen and pelvis was performed following the standard protocol during bolus administration of intravenous contrast. RADIATION DOSE REDUCTION: This exam was performed according to the departmental dose-optimization program which includes automated exposure control, adjustment of the mA and/or kV according to patient size and/or  use of iterative reconstruction technique. CONTRAST:  OMNIPAQUE IOHEXOL 300 MG/ML  SOLN COMPARISON:  CT 04/28/2022 and older FINDINGS: CT CHEST FINDINGS Cardiovascular: Status post median sternotomy. The heart nonenlarged. No pericardial effusion. Calcifications are seen in the area of the aortic valve and coronary arteries. The thoracic aorta has a normal course and caliber with mild atherosclerotic plaque. Mediastinum/Nodes: Preserved small thyroid gland. Normal caliber thoracic esophagus. Surgical clips in the right axillary region. No specific abnormal lymph node enlargement identified in the axillary regions, hilum or mediastinum. Lungs/Pleura: Surgical changes from right-sided lobectomy. Areas of scarring and fibrotic changes pleural thickening and a tiny right pleural effusion, similar to previous. The ill-defined opacity identified right perihilar with distortion tissue is stable abutting the suture line on series 507, image 87.  Overall simple attention on follow-up for the patient's neoplasm. Left lung is grossly clear without consolidation, pneumothorax or effusion. The tiny sub 5 mm left upper lobe nodules are stable on series 507, image 45 and 47. Stable area of pleural thickening and nodularity along the lateral left lower lobe on series 507, image 124 as well. Musculoskeletal: Curvature of the spine with scattered degenerative changes. CT ABDOMEN PELVIS FINDINGS Hepatobiliary: Fatty liver infiltration. Stable benign-appearing hepatic cystic lesions. Patent portal vein. Gallbladder is nondilated. Features. Pancreas: Unremarkable. No pancreatic ductal dilatation or surrounding inflammatory changes. Spleen: Normal in size without focal abnormality.  Small splenule. Adrenals/Urinary Tract: Duplicated left renal collecting system. No enhancing renal mass or collecting system dilatation. Parapelvic cysts in the left kidney. Small lower pole parenchymal Bosniak 1 cyst is also stable. No specific imaging follow-up. Preserved adrenal glands. Preserved contours of the urinary bladder. Slight wall thickening but there is an enlarged prostate. Stomach/Bowel: Scattered colonic stool. Few left-sided colonic diverticula. Normal appendix. The stomach, small bowel and large bowel are nondilated. Vascular/Lymphatic: Aortic atherosclerosis. No enlarged abdominal or pelvic lymph nodes. Reproductive: Enlarged prostate with mass effect along the base of the bladder. Other: Small fat containing inguinal hernias. No free air or free fluid. Musculoskeletal: Streak artifact related to the patient's left hip arthroplasty. Scattered degenerative changes of the spine and pelvis. Trace retrolisthesis of L2 on L3. Diffuse bridging syndesmophytes and osteophytes. IMPRESSION: No significant interval change. No new mass lesion, fluid collection or lymph node enlargement. Stable surgical changes in the right hemithorax with a tiny effusion, parenchymal opacity and  areas of pleural thickening. Mild fatty liver infiltration. Few colonic diverticula. Enlarged prostate. Electronically Signed   By: Karen Kays M.D.   On: 09/29/2022 14:10     ASSESSMENT AND PLAN: This is a very pleasant 72  years old white male with a stage Ia non-small cell lung cancer status post right upper lobectomy with lymph node dissection on September 02, 2016. The patient he was on observation since that time. Unfortunately there are some concerning findings with nodular density in the posterior right lower lobe as well as increase and right pleural effusion suspicious for disease recurrence. He underwent ultrasound-guided right thoracentesis and the cytology of the pleural fluid was consistent with recurrent adenocarcinoma. He had molecular studies performed by [facility] 1 that showed positive EGFR mutation with deletion 19. The patient was started on treatment with Tagrisso 80 mg p.o. daily on 11/05/2017.  He status post 57 months of treatment. He recently underwent SBRT to the enlarging right lung nodule under the care of Dr. Mitzi Hansen. The patient has been tolerating this treatment well with no concerning adverse effects. He had repeat CT scan of the chest,  abdomen and pelvis performed recently.  I personally and independently reviewed the scan and discussed the result with the patient and his wife. His scan showed no concerning findings for disease progression. I recommended for him to continue his current treatment with Tagrisso 80 mg p.o. daily. I will see him back for follow-up visit in 3 months with repeat blood work. For the back pain, he will see orthopedic surgery for evaluation. He was advised to call immediately if he has any other concerning symptoms in the interval. The patient voices understanding of current disease status and treatment options and is in agreement with the current care plan. All questions were answered. The patient knows to call the clinic with any problems,  questions or concerns. We can certainly see the patient much sooner if necessary. The total time spent in the appointment was 30 minutes.  Disclaimer: This note was dictated with voice recognition software. Similar sounding words can inadvertently be transcribed and may not be corrected upon review.

## 2022-10-06 ENCOUNTER — Other Ambulatory Visit: Payer: Self-pay

## 2022-10-06 DIAGNOSIS — C3491 Malignant neoplasm of unspecified part of right bronchus or lung: Secondary | ICD-10-CM

## 2022-10-07 ENCOUNTER — Inpatient Hospital Stay: Payer: Medicare PPO | Admitting: Licensed Clinical Social Worker

## 2022-10-07 ENCOUNTER — Other Ambulatory Visit: Payer: Self-pay

## 2022-10-07 DIAGNOSIS — C3491 Malignant neoplasm of unspecified part of right bronchus or lung: Secondary | ICD-10-CM

## 2022-10-07 NOTE — Progress Notes (Signed)
[facility] CSW Counseling Note  Patient was referred by medical provider. Treatment type: Couples  Presenting Concerns: Patient and/or family reports the following symptoms/concerns: anxiety and depression Duration of problem: 6 months; Severity of problem: moderate   Orientation:oriented to person, place, time/date, situation, day of week, month of year, and year.   Affect: Appropriate Risk of harm to self or others: No plan to harm self or others  Patient and/or Family's Strengths/Protective Factors: Social connections, Social and Emotional competence, and Concrete supports in place (healthy food, safe environments, etc.)Capable of independent living  Motivation for treatment/growth  Supportive family/friends      Goals Addressed: Patient will:  Reduce symptoms of: anxiety and depression Increase knowledge and/or ability of: coping skills and self-management skills  Increase healthy adjustment to current life circumstances and Increase motivation to adhere to plan of care   Progress towards Goals: Initial   Interventions: Interventions utilized:  CBT, Solution Focused, Strength-based, and Supportive      Assessment: Patient currently experiencing Situational depression connected to cancer diagnosis and chronic pain.  Pt's wife expressed concern regarding pt's lack of desire to engage in activities he previously enjoyed.  Pt spoke at length regarding anxiety experienced prior to scans as well as frustration with multiple medical issues that cause constant pain.  Pt hesitant to address pain issues as he developed diabetes after taking steroids for pain previously.  Pt is fearful that he will not be able to cope with any additional medical complications as his quality of life has significantly declined over recent years since being diagnosed with cancer.  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  Both pt and spouse were very excited for  retirement; however, since pt's diagnosis they have struggled with side effects and medical issues both for pt as well as the loss of spouses father and subsequent realization that her mother has advanced dementia.  Emotional support provided.  CSW discussed coping skills with pt as well as the importance of addressing chronic pain issues as untreated unrelenting pain significantly impacts quality of life and overall well being.  Suggestions provided to encourage engagement in activities.  Pt verbalized agreement to book an appointment with orthopedics to begin addressing pain issues.        Plan: Follow up with CSW: pt will contact CSW if additional support is needed Behavioral recommendations: booking appointment w/ orthopedic doctor, discussing w/ spouse activities realistic for pt to engage in encouraged, importance of acceptance of current physical functioning discussed at length to initiate realistic goal setting to re-engage in activities Referral(s):        Rachel Moulds, LCSW

## 2022-10-08 ENCOUNTER — Telehealth: Payer: Self-pay

## 2022-10-08 NOTE — Telephone Encounter (Signed)
Pt referred to palliative care, pt and family declined palliative services at this time. Pt and family verbalize understanding that palliative care can be re-consulted by oncologist if needed or desired.

## 2022-10-10 ENCOUNTER — Other Ambulatory Visit: Payer: Self-pay | Admitting: Cardiology

## 2022-10-22 ENCOUNTER — Other Ambulatory Visit (HOSPITAL_COMMUNITY): Payer: Self-pay

## 2022-10-22 ENCOUNTER — Other Ambulatory Visit: Payer: Self-pay | Admitting: Cardiology

## 2022-10-22 NOTE — Telephone Encounter (Addendum)
Prescription refill request for Xarelto received.  Indication: PE Last office visit: [place], 07/27/2022 Weight: 104.2 kg  Age: 72 yo  Scr: 1.52, 09/28/2022 CrCl:  64 ml/min   Refill sent.

## 2022-11-26 ENCOUNTER — Other Ambulatory Visit (HOSPITAL_COMMUNITY): Payer: Self-pay

## 2022-11-27 ENCOUNTER — Other Ambulatory Visit (HOSPITAL_COMMUNITY): Payer: Self-pay

## 2022-12-24 ENCOUNTER — Other Ambulatory Visit (HOSPITAL_COMMUNITY): Payer: Self-pay

## 2022-12-25 ENCOUNTER — Other Ambulatory Visit: Payer: Self-pay | Admitting: Physician Assistant

## 2022-12-25 ENCOUNTER — Other Ambulatory Visit (HOSPITAL_COMMUNITY): Payer: Self-pay

## 2022-12-25 ENCOUNTER — Other Ambulatory Visit: Payer: Self-pay

## 2022-12-25 DIAGNOSIS — C3491 Malignant neoplasm of unspecified part of right bronchus or lung: Secondary | ICD-10-CM

## 2022-12-25 MED ORDER — OSIMERTINIB MESYLATE 80 MG PO TABS
ORAL_TABLET | Freq: Every day | ORAL | 2 refills | Status: DC
  Filled 2022-12-25: qty 30, 30d supply, fill #0
  Filled 2023-01-21: qty 30, 30d supply, fill #1
  Filled 2023-02-26: qty 30, 30d supply, fill #2

## 2022-12-28 ENCOUNTER — Other Ambulatory Visit (HOSPITAL_COMMUNITY): Payer: Self-pay

## 2022-12-31 ENCOUNTER — Inpatient Hospital Stay: Payer: Medicare PPO | Attending: Internal Medicine

## 2022-12-31 ENCOUNTER — Other Ambulatory Visit (HOSPITAL_COMMUNITY): Payer: Self-pay

## 2022-12-31 ENCOUNTER — Inpatient Hospital Stay (HOSPITAL_BASED_OUTPATIENT_CLINIC_OR_DEPARTMENT_OTHER): Payer: Medicare PPO | Admitting: Internal Medicine

## 2022-12-31 VITALS — BP 136/74 | HR 85 | Temp 98.5°F | Resp 18 | Ht 67.0 in | Wt 225.0 lb

## 2022-12-31 DIAGNOSIS — M545 Low back pain, unspecified: Secondary | ICD-10-CM | POA: Insufficient documentation

## 2022-12-31 DIAGNOSIS — D696 Thrombocytopenia, unspecified: Secondary | ICD-10-CM | POA: Diagnosis not present

## 2022-12-31 DIAGNOSIS — Z902 Acquired absence of lung [part of]: Secondary | ICD-10-CM | POA: Diagnosis not present

## 2022-12-31 DIAGNOSIS — C3491 Malignant neoplasm of unspecified part of right bronchus or lung: Secondary | ICD-10-CM

## 2022-12-31 DIAGNOSIS — C3490 Malignant neoplasm of unspecified part of unspecified bronchus or lung: Secondary | ICD-10-CM

## 2022-12-31 DIAGNOSIS — C3431 Malignant neoplasm of lower lobe, right bronchus or lung: Secondary | ICD-10-CM | POA: Diagnosis not present

## 2022-12-31 LAB — CBC WITH DIFFERENTIAL (CANCER CENTER ONLY)
Abs Immature Granulocytes: 0.02 10*3/uL (ref 0.00–0.07)
Basophils Absolute: 0 10*3/uL (ref 0.0–0.1)
Basophils Relative: 1 %
Eosinophils Absolute: 0.1 10*3/uL (ref 0.0–0.5)
Eosinophils Relative: 2 %
HCT: 47.4 % (ref 39.0–52.0)
Hemoglobin: 16 g/dL (ref 13.0–17.0)
Immature Granulocytes: 0 %
Lymphocytes Relative: 23 %
Lymphs Abs: 1.5 10*3/uL (ref 0.7–4.0)
MCH: 31.7 pg (ref 26.0–34.0)
MCHC: 33.8 g/dL (ref 30.0–36.0)
MCV: 93.9 fL (ref 80.0–100.0)
Monocytes Absolute: 0.8 10*3/uL (ref 0.1–1.0)
Monocytes Relative: 12 %
Neutro Abs: 4.1 10*3/uL (ref 1.7–7.7)
Neutrophils Relative %: 62 %
Platelet Count: 146 10*3/uL — ABNORMAL LOW (ref 150–400)
RBC: 5.05 MIL/uL (ref 4.22–5.81)
RDW: 14.7 % (ref 11.5–15.5)
WBC Count: 6.5 10*3/uL (ref 4.0–10.5)
nRBC: 0 % (ref 0.0–0.2)

## 2022-12-31 LAB — CMP (CANCER CENTER ONLY)
ALT: 23 U/L (ref 0–44)
AST: 24 U/L (ref 15–41)
Albumin: 4.1 g/dL (ref 3.5–5.0)
Alkaline Phosphatase: 58 U/L (ref 38–126)
Anion gap: 5 (ref 5–15)
BUN: 25 mg/dL — ABNORMAL HIGH (ref 8–23)
CO2: 26 mmol/L (ref 22–32)
Calcium: 9.4 mg/dL (ref 8.9–10.3)
Chloride: 109 mmol/L (ref 98–111)
Creatinine: 1.45 mg/dL — ABNORMAL HIGH (ref 0.61–1.24)
GFR, Estimated: 51 mL/min — ABNORMAL LOW (ref >60)
Glucose, Bld: 116 mg/dL — ABNORMAL HIGH (ref 70–99)
Potassium: 4.8 mmol/L (ref 3.5–5.1)
Sodium: 140 mmol/L (ref 135–145)
Total Bilirubin: 0.4 mg/dL (ref 0.3–1.2)
Total Protein: 6.9 g/dL (ref 6.5–8.1)

## 2022-12-31 NOTE — Progress Notes (Signed)
[facility] [phone]   [phone]  OFFICE PROGRESS NOTE  Etta Grandchild, MD [address] [place] [address]  DIAGNOSIS: Recurrent non-small cell lung cancer, adenocarcinoma initially diagnosed as stage IA (T1c, N0, M0) non-small cell lung cancer, adenocarcinoma presented with right upper lobe lung nodule in April 2018 with recurrence in June 2019.  Biomarker Findings Microsatellite status - MS-Stable Tumor Mutational Burden - TMB-Low (3 Muts/Mb) Genomic Findings For a complete list of the genes assayed, please refer to the Appendix. EGFR exon 19 deletion ([id number]) CDKN2A/B loss NKX2-1 amplification 7 Disease relevant genes with no reportable alterations: KRAS, ALK, BRAF, MET, RET, ERBB2, ROS1   PRIOR THERAPY:  1) Status post right upper lobectomy with lymph node dissection under the care of Dr. Dorris Fetch on 09/02/2016. 2) status post palliative radiotherapy to enlarging right upper lobe lung nodule under the care of Dr. Mitzi Hansen completed on Oct 07, 2021.  CURRENT THERAPY: Tagrisso 80 mg p.o. daily.  First dose started November 05, 2017.  Status post 62 months of treatment.  INTERVAL HISTORY: James [name] 72 y.o. male returns to the clinic today for follow-up visit accompanied by his wife.  The patient is feeling fine today with no concerning complaints except for the persistent low back pain.  He denied having any current chest pain, shortness of breath, cough or hemoptysis.  He has no nausea, vomiting, diarrhea or constipation.  He has no headache or visual changes.  He denied having any fever or chills.  He has no recent weight loss or night sweats.  He continues to tolerate his treatment with Tagrisso fairly well.  He is here for evaluation and repeat blood work.  MEDICAL HISTORY: Past Medical History:  Diagnosis Date   Adenocarcinoma of right lung, stage 1 (HCC) 09/06/2016   Anxiety    Arthritis    "knees, hips, back"  (10/19/2012)   Chronic diastolic congestive heart failure (HCC)    Chronic lower back pain    Colon polyps    10/27/2002, repeat letter 09/17/2007   Coronary artery disease    Coronary artery disease involving native coronary artery of native heart without angina pectoris    Depressive disorder, not elsewhere classified    no meds   Diabetes mellitus without complication (HCC)    diet controlled- no med  (while in hosp 4/18 -elevated cbg   Dyspnea    Fasting hyperglycemia    GERD (gastroesophageal reflux disease)    Heart murmur    Hemoptysis    abnormal CT Chest 01/29/10 - ? new GG changes RUL > not viz on plain cxr 02/26/2010   Hypertension    Mitral regurgitation    severe MR 08/2016   MPN (myeloproliferative neoplasm) (HCC)    1st detected 06/04/1998   Obesity    OSA on CPAP    last sleep study 10 years ago   Other and unspecified hyperlipidemia    Peripheral vascular disease (HCC) 08/2016   after lung surgey small clots in lungs,after hip dvt-5/16   Pneumonia    4/18   Positive PPD 1965   "non reactive in 2012" (10/19/2012)   Routine general medical examination at a health care facility    S/P CABG x 1 03/24/2017   LIMA to LAD   S/P mitral valve repair 03/24/2017   Complex valvuloplasty including artificial Gore-tex neochord placement x6 and 28 mm Sorin Annuloflex posterior annuloplasty band   Special screening for malignant neoplasm  of prostate    Spinal stenosis, unspecified region other than cervical    Wrist pain, left     ALLERGIES:  is allergic to symbicort [budesonide-formoterol fumarate] and amoxicillin.  MEDICATIONS:  Current Outpatient Medications  Medication Sig Dispense Refill   clindamycin (CLEOCIN-T) 1 % external solution Apply topically 2 (two) times daily. 60 mL 0   empagliflozin (JARDIANCE) 10 MG TABS tablet Take 1 tablet (10 mg total) by mouth daily before breakfast. 90 tablet 1   Evolocumab (REPATHA SURECLICK) 140 MG/ML SOAJ Inject 140 mg into the skin  every 14 (fourteen) days. 6 mL 3   ezetimibe (ZETIA) 10 MG tablet TAKE 1 TABLET BY MOUTH EVERY DAY 90 tablet 3   furosemide (LASIX) 20 MG tablet TAKE 1 TABLET BY MOUTH EVERY DAY 90 tablet 2   LORazepam (ATIVAN) 0.5 MG tablet 1 tablet p.o. 30 minutes before the MRI.  May repeat once if needed. 2 tablet 0   methocarbamol (ROBAXIN) 500 MG tablet Take 1 tablet (500 mg total) by mouth every 8 (eight) hours as needed for muscle spasms. 270 tablet 1   metoprolol tartrate (LOPRESSOR) 25 MG tablet TAKE 1/2 TABLET TWICE A DAY BY MOUTH 90 tablet 3   Multiple Vitamin (MULTIVITAMIN) tablet Take 1 tablet by mouth daily.     Omega-3 Fatty Acids (FISH OIL) 500 MG CAPS Take 500 mg by mouth daily.      osimertinib mesylate (TAGRISSO) 80 MG tablet TAKE 1 TABLET (80 MG TOTAL) BY MOUTH DAILY. 30 tablet 2   rivaroxaban (XARELTO) 20 MG TABS tablet TAKE 1 TABLET BY MOUTH DAILY WITH SUPPER 90 tablet 1   sildenafil (REVATIO) 20 MG tablet Take 4 tablets (80 mg total) by mouth daily as needed. 60 tablet 5   No current facility-administered medications for this visit.    SURGICAL HISTORY:  Past Surgical History:  Procedure Laterality Date   ANTERIOR CRUCIATE LIGAMENT REPAIR Left 1967   CARDIAC CATHETERIZATION  2000   CHEST TUBE INSERTION Right 10/19/2012   post bronch   COLONOSCOPY W/ POLYPECTOMY     CORONARY ARTERY BYPASS GRAFT N/A 03/24/2017   Procedure: CORONARY ARTERY BYPASS GRAFTING (CABG)x1 using left internal mammary artery, LIMA-LAD;  Surgeon: Purcell Nails, MD;  Location: [facility] OR;  Service: Open Heart Surgery;  Laterality: N/A;   CORONARY PRESSURE/FFR STUDY N/A 08/11/2016   Procedure: Intravascular Pressure Wire/FFR Study;  Surgeon: Peter M Swaziland, MD;  Location: [facility] INVASIVE CV LAB;  Service: Cardiovascular;  Laterality: N/A;   FLEXIBLE BRONCHOSCOPY  10/19/2012   Flexible video fiberoptic bronchoscopy with electromagnetic navigation and biopsies.   IR ANGIOGRAM PULMONARY BILATERAL SELECTIVE  02/06/2018   IR  ANGIOGRAM SELECTIVE EACH ADDITIONAL VESSEL  02/06/2018   IR ANGIOGRAM SELECTIVE EACH ADDITIONAL VESSEL  02/06/2018   IR INFUSION THROMBOL ARTERIAL INITIAL (MS)  02/06/2018   IR INFUSION THROMBOL ARTERIAL INITIAL (MS)  02/06/2018   IR THROMB F/U EVAL ART/VEN FINAL DAY (MS)  02/07/2018   IR US GUIDE VASC ACCESS RIGHT  02/06/2018   LOBECTOMY Right 09/02/2016   Procedure: RIGHT UPPER LOBECTOMY;  Surgeon: Loreli Slot, MD;  Location: [facility] OR;  Service: Thoracic;  Laterality: Right;   LYMPH NODE DISSECTION Right 09/02/2016   Procedure: LYMPH NODE DISSECTION, RIGHT LUNG;  Surgeon: Loreli Slot, MD;  Location: [facility] OR;  Service: Thoracic;  Laterality: Right;   MITRAL VALVE REPAIR N/A 03/24/2017   Procedure: MITRAL VALVE REPAIR (MVR) with Sorin [employer] Annuloflex size 28;  Surgeon: Purcell Nails, MD;  Location: [facility] OR;  Service: Open Heart Surgery;  Laterality: N/A;   RIGHT/LEFT HEART CATH AND CORONARY ANGIOGRAPHY N/A 08/11/2016   Procedure: Right/Left Heart Cath and Coronary Angiography;  Surgeon: Peter M Swaziland, MD;  Location: [facility] INVASIVE CV LAB;  Service: Cardiovascular;  Laterality: N/A;   TEE WITHOUT CARDIOVERSION N/A 07/17/2016   Procedure: TRANSESOPHAGEAL ECHOCARDIOGRAM (TEE);  Surgeon: Chrystie Nose, MD;  Location: [facility] ENDOSCOPY;  Service: Cardiovascular;  Laterality: N/A;   TEE WITHOUT CARDIOVERSION N/A 03/24/2017   Procedure: TRANSESOPHAGEAL ECHOCARDIOGRAM (TEE);  Surgeon: Purcell Nails, MD;  Location: [facility] OR;  Service: Open Heart Surgery;  Laterality: N/A;   TONSILLECTOMY  1950's   TOTAL HIP ARTHROPLASTY Left 10/05/2014   dr Despina Hick   TOTAL HIP ARTHROPLASTY Left 10/05/2014   Procedure: LEFT TOTAL HIP ARTHROPLASTY ANTERIOR APPROACH;  Surgeon: Ollen Gross, MD;  Location: [facility] OR;  Service: Orthopedics;  Laterality: Left;   VIDEO ASSISTED THORACOSCOPY (VATS)/WEDGE RESECTION Right 09/02/2016   Procedure: VIDEO ASSISTED THORACOSCOPY (VATS)/RIGHT UPPER LOBE WEDGE RESECTION;  Surgeon: Loreli Slot, MD;  Location: [facility] OR;  Service: Thoracic;  Laterality: Right;   VIDEO BRONCHOSCOPY WITH ENDOBRONCHIAL NAVIGATION N/A 10/19/2012   Procedure: VIDEO BRONCHOSCOPY WITH ENDOBRONCHIAL NAVIGATION;  Surgeon: Leslye Peer, MD;  Location: [facility] OR;  Service: Thoracic;  Laterality: N/A;   WRIST RECONSTRUCTION Left 12/2009   'proximal row carpectomy" [name]    REVIEW OF SYSTEMS:  A comprehensive review of systems was negative except for: Musculoskeletal: positive for back pain   PHYSICAL EXAMINATION: General appearance: alert, cooperative, and no distress Head: Normocephalic, without obvious abnormality, atraumatic Neck: no adenopathy, no JVD, supple, symmetrical, trachea midline, and thyroid not enlarged, symmetric, no tenderness/mass/nodules Lymph nodes: Cervical, supraclavicular, and axillary nodes normal. Resp: clear to auscultation bilaterally Back: symmetric, no curvature. ROM normal. No CVA tenderness. Cardio: regular rate and rhythm, S1, S2 normal, no murmur, click, rub or gallop GI: soft, non-tender; bowel sounds normal; no masses,  no organomegaly Extremities: extremities normal, atraumatic, no cyanosis or edema  ECOG PERFORMANCE STATUS: 1 - Symptomatic but completely ambulatory  Blood pressure 136/74, pulse 85, temperature 98.5 F (36.9 C), temperature source Oral, resp. rate 18, height 5\' 7"  (1.702 m), weight 225 lb (102.1 kg), SpO2 98%.  LABORATORY DATA: Lab Results  Component Value Date   WBC 6.5 12/31/2022   HGB 16.0 12/31/2022   HCT 47.4 12/31/2022   MCV 93.9 12/31/2022   PLT 146 (L) 12/31/2022      Chemistry      Component Value Date/Time   NA 140 09/28/2022 0858   NA 142 10/01/2016 1534   K 3.9 09/28/2022 0858   K 4.2 10/01/2016 1534   CL 111 09/28/2022 0858   CO2 22 09/28/2022 0858   CO2 28 10/01/2016 1534   BUN 22 09/28/2022 0858   BUN 15.9 10/01/2016 1534   CREATININE 1.52 (H) 09/28/2022 0858   CREATININE 1.10 02/08/2017 1101   CREATININE 1.5 (H)  10/01/2016 1534      Component Value Date/Time   CALCIUM 9.0 09/28/2022 0858   CALCIUM 9.6 10/01/2016 1534   ALKPHOS 54 09/28/2022 0858   ALKPHOS 65 10/01/2016 1534   AST 21 09/28/2022 0858   AST 20 10/01/2016 1534   ALT 21 09/28/2022 0858   ALT 24 10/01/2016 1534   BILITOT 0.5 09/28/2022 0858   BILITOT 0.46 10/01/2016 1534       RADIOGRAPHIC STUDIES: No results found.   ASSESSMENT AND PLAN: This is a very pleasant 72 years  old white male with a stage Ia non-small cell lung cancer status post right upper lobectomy with lymph node dissection on September 02, 2016. The patient he was on observation since that time. Unfortunately there are some concerning findings with nodular density in the posterior right lower lobe as well as increase and right pleural effusion suspicious for disease recurrence. He underwent ultrasound-guided right thoracentesis and the cytology of the pleural fluid was consistent with recurrent adenocarcinoma. He had molecular studies performed by foundation 1 that showed positive EGFR mutation with deletion 19. The patient was started on treatment with Tagrisso 80 mg p.o. daily on 11/05/2017.  He status post 62 months of treatment. He recently underwent SBRT to the enlarging right lung nodule under the care of Dr. Mitzi Hansen. The patient has been tolerating his treatment with Tagrisso fairly well with no concerning adverse effects. CBC today is unremarkable except for mild thrombocytopenia.  Comprehensive metabolic panel is still pending I recommended for him to continue his current treatment with Tagrisso with the same dose. I will see him back for follow-up visit in 3 months for evaluation with repeat CT scan of the chest for restaging of his disease.  The patient voices understanding of current disease status and treatment options and is in agreement with the current care plan. All questions were answered. The patient knows to call the clinic with any problems, questions  or concerns. We can certainly see the patient much sooner if necessary. The total time spent in the appointment was 20 minutes.  Disclaimer: This note was dictated with voice recognition software. Similar sounding words can inadvertently be transcribed and may not be corrected upon review.

## 2023-01-04 DIAGNOSIS — G4733 Obstructive sleep apnea (adult) (pediatric): Secondary | ICD-10-CM | POA: Diagnosis not present

## 2023-01-21 ENCOUNTER — Other Ambulatory Visit (HOSPITAL_COMMUNITY): Payer: Self-pay

## 2023-02-03 ENCOUNTER — Other Ambulatory Visit (HOSPITAL_COMMUNITY): Payer: Self-pay

## 2023-02-08 ENCOUNTER — Telehealth: Payer: Self-pay | Admitting: Cardiology

## 2023-02-08 MED ORDER — AZITHROMYCIN 500 MG PO TABS: 500.0000 mg | ORAL_TABLET | Freq: Every day | ORAL | 0 refills | Status: DC

## 2023-02-08 NOTE — Telephone Encounter (Signed)
Pre-operative Risk Assessment    Patient Name: James [name]  DOB: [birth year] MRN: [id number]     Request for Surgical Clearance    Procedure:   Routine Dental Cleaning  Date of Surgery:  Clearance 02/16/23                                 Surgeon:  Ddr. Jon [facility] Surgeon's Group or Practice Name:  [facility]  Phone number:  [phone] Fax number:  N/A   Type of Clearance Requested:   - Medical  - Pharmacy:  Hold TBD  by cardiology   Type of Anesthesia:  None    Additional requests/questions:  Does this patient need antibiotics?  [name]   02/08/2023, 12:56 PM

## 2023-02-08 NOTE — Telephone Encounter (Signed)
    Primary Cardiologist: Peter Swaziland, MD  Chart reviewed as part of pre-operative protocol coverage. Simple dental extractions are considered low risk procedures per guidelines and generally do not require any specific cardiac clearance. It is also generally accepted that for simple extractions and dental cleanings, there is no need to interrupt blood thinner therapy.   SBE prophylaxis is required for patient's cleaning and prescription has been sent for azithromycin 500 mg to be taken prior to dental procedure.  I will route this recommendation to the requesting party via Epic fax function and remove from pre-op pool.  Please call with questions.  Napoleon Form, Leodis Rains, NP 02/08/2023, 1:10 PM

## 2023-02-18 NOTE — Progress Notes (Signed)
d   Cardiology Office Note    Date:  02/23/2023   ID:  James [name], DOB 1950-09-28, MRN [id number]  PCP:  Etta Grandchild, MD  Cardiologist:  Dr. [place]   Chief Complaint  Patient presents with   Follow-up    6 months.   Coronary Artery Disease    History of Present Illness:  James [name] is a 72 y.o. male with PMH of DM II, HLD, and severe MR. he was originally noted to have a loud murmur by his primary care physician.  Initial echocardiogram suggested mild MR, however it also suggested partially flail leaflet.  MR was thought to be underestimated.  TEE showed a flail P3 segment with ruptured cord and a severe MR.  Cardiac catheterization performed on 08/11/2016 showed 65% mid to distal LAD lesion, FFR borderline at 0.81, EF 65%.  Normal cardiac output and RV/LV filling pressure.  He had a PET scan on 08/17/2016 that showed a hypermetabolic solid 2.6 cm anterior right upper lobe pulmonary nodule compatible with prior bronchogenic, no hypermetabolic thoracic lymphadenopathy or distal metastatic disease.  There were nonspecific and mild asymmetric hypermetabolism in the left pontine tonsil and the left tongue base region.  CT surgery recommended lobectomy for his lung mass with possible CABG with LIMA to LAD and MV repair.  He underwent right upper lobe lobectomy. Pathology positive for adenocarcinoma stage Ia.  Serial CT scan was recommended by oncology.  Post procedure, he became very short of breath.  CTA was positive for a small PE, he was started on Xarelto.  Repeat echocardiogram obtained on 02/15/2017 showed EF 60-65%, persistent MR.  He eventually underwent mitral valve repair with Sorin Carbomedics Annuloglex size 28, LIMA to LAD by Dr. Cornelius Moras. He was DC on coumadin instead of Xarelto. Completed 6 months of anticoagulation for provoked PE and then it was stopped.   In August 20-19 CT showed a right pleural effusion. Thoracentesis was positive for recurrent CA. He was started on Tagrisso by  Dr. [name]. He was admitted on February 06, 2018 with submassive PE. He was treated with  EKOS with bilateral intra-arterial TPA, patient completed 6 days of IV heparin. The original plan was to transition to Xarelto after 5 days of IV heparin.  However he developed left flank pain, hematuria and right leg painful swelling.  Hence IV heparin was continued.  Both Urology and OIrthopedics evaluated and cleared for transitioning to Xarelto.  Xarelto was started on 02/12/2018.  Hematuria decreased  Right leg pain also improved. MRI did show evidence of hematoma that was improving. He had chronic DVT in right peroneal vein.  Since patient was started on Xarelto, aspirin discontinued to minimize bleeding risk.   In April he had a new nodule noted on Chest CT and had RT for this area.  Sees Dr [name] now for CKD. He was started on Jardiance and noted significant improvement in swelling so has only taken lasix a few times. Breathing overall is OK but had SOB a couple of times when lying down. No significant chest pain. No palpitations.     Past Medical History:  Diagnosis Date   Adenocarcinoma of right lung, stage 1 (HCC) 09/06/2016   Anxiety    Arthritis    "knees, hips, back" (10/19/2012)   Chronic diastolic congestive heart failure (HCC)    Chronic lower back pain    Colon polyps    10/27/2002, repeat letter 09/17/2007   Coronary artery disease    Coronary artery  disease involving native coronary artery of native heart without angina pectoris    Depressive disorder, not elsewhere classified    no meds   Diabetes mellitus without complication (HCC)    diet controlled- no med  (while in hosp 4/18 -elevated cbg   Dyspnea    Fasting hyperglycemia    GERD (gastroesophageal reflux disease)    Heart murmur    Hemoptysis    abnormal CT Chest 01/29/10 - ? new GG changes RUL > not viz on plain cxr 02/26/2010   Hypertension    Mitral regurgitation    severe MR 08/2016   MPN (myeloproliferative neoplasm)  (HCC)    1st detected 06/04/1998   Obesity    OSA on CPAP    last sleep study 10 years ago   Other and unspecified hyperlipidemia    Peripheral vascular disease (HCC) 08/2016   after lung surgey small clots in lungs,after hip dvt-5/16   Pneumonia    4/18   Positive PPD 1965   "non reactive in 2012" (10/19/2012)   Routine general medical examination at a health care facility    S/P CABG x 1 03/24/2017   LIMA to LAD   S/P mitral valve repair 03/24/2017   Complex valvuloplasty including artificial Gore-tex neochord placement x6 and 28 mm Sorin Annuloflex posterior annuloplasty band   Special screening for malignant neoplasm of prostate    Spinal stenosis, unspecified region other than cervical    Wrist pain, left     Past Surgical History:  Procedure Laterality Date   ANTERIOR CRUCIATE LIGAMENT REPAIR Left 1967   CARDIAC CATHETERIZATION  2000   CHEST TUBE INSERTION Right 10/19/2012   post bronch   COLONOSCOPY W/ POLYPECTOMY     CORONARY ARTERY BYPASS GRAFT N/A 03/24/2017   Procedure: CORONARY ARTERY BYPASS GRAFTING (CABG)x1 using left internal mammary artery, LIMA-LAD;  Surgeon: Purcell Nails, MD;  Location: [facility] OR;  Service: Open Heart Surgery;  Laterality: N/A;   CORONARY PRESSURE/FFR STUDY N/A 08/11/2016   Procedure: Intravascular Pressure Wire/FFR Study;  Surgeon: Damiel Barthold M Swaziland, MD;  Location: [facility] INVASIVE CV LAB;  Service: Cardiovascular;  Laterality: N/A;   FLEXIBLE BRONCHOSCOPY  10/19/2012   Flexible video fiberoptic bronchoscopy with electromagnetic navigation and biopsies.   IR ANGIOGRAM PULMONARY BILATERAL SELECTIVE  02/06/2018   IR ANGIOGRAM SELECTIVE EACH ADDITIONAL VESSEL  02/06/2018   IR ANGIOGRAM SELECTIVE EACH ADDITIONAL VESSEL  02/06/2018   IR INFUSION THROMBOL ARTERIAL INITIAL (MS)  02/06/2018   IR INFUSION THROMBOL ARTERIAL INITIAL (MS)  02/06/2018   IR THROMB F/U EVAL ART/VEN FINAL DAY (MS)  02/07/2018   IR US GUIDE VASC ACCESS RIGHT  02/06/2018   LOBECTOMY Right  09/02/2016   Procedure: RIGHT UPPER LOBECTOMY;  Surgeon: Loreli Slot, MD;  Location[facility] OR;  Service: Thoracic;  Laterality: Right;   LYMPH NODE DISSECTION Right 09/02/2016   Procedure: LYMPH NODE DISSECTION, RIGHT LUNG;  Surgeon: Loreli Slot, MD;  Location: [facility] OR;  Service: Thoracic;  Laterality: Right;   MITRAL VALVE REPAIR N/A 03/24/2017   Procedure: MITRAL VALVE REPAIR (MVR) with Sorin Carbomedics Annuloflex size 28;  Surgeon: Purcell Nails, MD;  Location: [facility] OR;  Service: Open Heart Surgery;  Laterality: N/A;   RIGHT/LEFT HEART CATH AND CORONARY ANGIOGRAPHY N/A 08/11/2016   Procedure: Right/Left Heart Cath and Coronary Angiography;  Surgeon: Lucile Didonato M Swaziland, MD;  Location: [facility] INVASIVE CV LAB;  Service: Cardiovascular;  Laterality: N/A;   TEE WITHOUT CARDIOVERSION N/A 07/17/2016   Procedure: TRANSESOPHAGEAL  ECHOCARDIOGRAM (TEE);  Surgeon: Chrystie Nose, MD;  Location: [facility] ENDOSCOPY;  Service: Cardiovascular;  Laterality: N/A;   TEE WITHOUT CARDIOVERSION N/A 03/24/2017   Procedure: TRANSESOPHAGEAL ECHOCARDIOGRAM (TEE);  Surgeon: Purcell Nails, MD;  Location: [facility] OR;  Service: Open Heart Surgery;  Laterality: N/A;   TONSILLECTOMY  1950's   TOTAL HIP ARTHROPLASTY Left 10/05/2014   dr Despina Hick   TOTAL HIP ARTHROPLASTY Left 10/05/2014   Procedure: LEFT TOTAL HIP ARTHROPLASTY ANTERIOR APPROACH;  Surgeon: Ollen Gross, MD;  Location: [facility] OR;  Service: Orthopedics;  Laterality: Left;   VIDEO ASSISTED THORACOSCOPY (VATS)/WEDGE RESECTION Right 09/02/2016   Procedure: VIDEO ASSISTED THORACOSCOPY (VATS)/RIGHT UPPER LOBE WEDGE RESECTION;  Surgeon: Loreli Slot, MD;  Location: [facility] OR;  Service: Thoracic;  Laterality: Right;   VIDEO BRONCHOSCOPY WITH ENDOBRONCHIAL NAVIGATION N/A 10/19/2012   Procedure: VIDEO BRONCHOSCOPY WITH ENDOBRONCHIAL NAVIGATION;  Surgeon: Leslye Peer, MD;  Location: [facility] OR;  Service: Thoracic;  Laterality: N/A;   WRIST RECONSTRUCTION Left 12/2009   'proximal row  carpectomy" [name]    Current Medications: Outpatient Medications Prior to Visit  Medication Sig Dispense Refill   clindamycin (CLEOCIN-T) 1 % external solution Apply topically 2 (two) times daily. 60 mL 0   empagliflozin (JARDIANCE) 10 MG TABS tablet Take 1 tablet (10 mg total) by mouth daily before breakfast. 90 tablet 1   ezetimibe (ZETIA) 10 MG tablet TAKE 1 TABLET BY MOUTH EVERY DAY 90 tablet 3   LORazepam (ATIVAN) 0.5 MG tablet 1 tablet p.o. 30 minutes before the MRI.  May repeat once if needed. 2 tablet 0   methocarbamol (ROBAXIN) 500 MG tablet Take 1 tablet (500 mg total) by mouth every 8 (eight) hours as needed for muscle spasms. 270 tablet 1   metoprolol tartrate (LOPRESSOR) 25 MG tablet TAKE 1/2 TABLET TWICE A DAY BY MOUTH 90 tablet 3   Multiple Vitamin (MULTIVITAMIN) tablet Take 1 tablet by mouth daily.     Omega-3 Fatty Acids (FISH OIL) 500 MG CAPS Take 500 mg by mouth daily.      osimertinib mesylate (TAGRISSO) 80 MG tablet TAKE 1 TABLET (80 MG TOTAL) BY MOUTH DAILY. 30 tablet 2   sildenafil (REVATIO) 20 MG tablet Take 4 tablets (80 mg total) by mouth daily as needed. 60 tablet 5   Evolocumab (REPATHA SURECLICK) 140 MG/ML SOAJ Inject 140 mg into the skin every 14 (fourteen) days. 6 mL 3   furosemide (LASIX) 20 MG tablet TAKE 1 TABLET BY MOUTH EVERY DAY 90 tablet 2   rivaroxaban (XARELTO) 20 MG TABS tablet TAKE 1 TABLET BY MOUTH DAILY WITH SUPPER 90 tablet 1   azithromycin (ZITHROMAX) 500 MG tablet Take 1 tablet (500 mg total) by mouth daily. 1 tablet 0   No facility-administered medications prior to visit.     Allergies:   Symbicort [budesonide-formoterol fumarate] and Amoxicillin   Social History   Socioeconomic History   Marital status: Married    Spouse name: Not on file   Number of children: 3   Years of education: Not on file   Highest education level: Not on file  Occupational History   Occupation: Retired, Multimedia programmer   Occupation: Retired, Real estate     Comment: Slow  Tobacco Use   Smoking status: Never   Smokeless tobacco: Never  Vaping Use   Vaping status: Never Used  Substance and Sexual Activity   Alcohol use: Yes    Alcohol/week: 1.0 standard drink of alcohol    Types: 1  Cans of beer per week    Comment: none since 3 weeks   Drug use: No   Sexual activity: Never    Partners: Female  Other Topics Concern   Not on file  Social History Narrative   HSG, [employer]. Married '73.  2 sons - '74,   '76;  1 daughter -  '77  6 grandchildren.   Work - Teacher, music now retired. ACP - not fully discussed.                   Social Determinants of Health   Financial Resource Strain: Not on file  Food Insecurity: Unknown (02/11/2018)   Hunger Vital Sign    Worried About Running Out of Food in the Last Year: Patient declined    Ran Out of Food in the Last Year: Patient declined  Transportation Needs: Unknown (02/11/2018)   PRAPARE - Administrator, Civil Service (Medical): Patient declined    Lack of Transportation (Non-Medical): Patient declined  Physical Activity: Unknown (02/11/2018)   Exercise Vital Sign    Days of Exercise per Week: Patient declined    Minutes of Exercise per Session: Patient declined  Stress: Not on file  Social Connections: Not on file     Family History:  The patient's family history includes Heart attack in his father; Heart disease in his father and paternal uncle; Heart failure in his mother.   ROS:   Please see the history of present illness.    ROS All other systems reviewed and are negative.   PHYSICAL EXAM:   VS:  BP 126/78 (BP Location: Right Arm, Patient Position: Sitting, Cuff Size: Normal)   Pulse 80   Ht 5\' 7"  (1.702 m)   Wt 224 lb (101.6 kg)   BMI 35.08 kg/m    GENERAL:  Well appearing WM in NAD HEENT:  PERRL, EOMI, sclera are clear. Oropharynx is clear. NECK:  No jugular venous distention, carotid upstroke brisk and symmetric, no bruits, no  thyromegaly or adenopathy LUNGS:  Clear to auscultation bilaterally CHEST:  Unremarkable HEART:  RRR,  PMI not displaced or sustained,S1 and S2 within normal limits, no S3, no S4: no clicks, no rubs, gr 1-2/6 systolic murmur RUSB ABD:  Soft, nontender. BS +, no masses or bruits. No hepatomegaly, no splenomegaly EXT:  2 + pulses throughout, 1+ pitting ankle edema, chronic stasis skin changes.  SKIN:  Warm and dry.  No rashes NEURO:  Alert and oriented x 3. Cranial nerves II through XII intact. PSYCH:  Cognitively intact      Wt Readings from Last 3 Encounters:  02/23/23 224 lb (101.6 kg)  12/31/22 225 lb (102.1 kg)  10/01/22 229 lb 12.8 oz (104.2 kg)      Studies/Labs Reviewed:   EKG Interpretation Date/Time:  Tuesday February 23 2023 13:55:20 EDT Ventricular Rate:  80 PR Interval:  144 QRS Duration:  82 QT Interval:  394 QTC Calculation: 454 R Axis:   44  Text Interpretation: Normal sinus rhythm Nonspecific ST and T wave abnormality When compared with ECG of 06-Feb-2018 10:04, improved R wave progression in precordial leads. Confirmed by [place], Itai Barbian [phone]) on 02/23/2023 1:57:54 PM     Recent Labs: 03/18/2022: Pro B Natriuretic peptide (BNP) 77.0; TSH 2.39 12/31/2022: ALT 23; BUN 25; Creatinine 1.45; Hemoglobin 16.0; Platelet Count 146; Potassium 4.8; Sodium 140   Lipid Panel    Component Value Date/Time   CHOL 216 (H) 03/18/2022 1557  CHOL 196 01/22/2021 0829   TRIG 97.0 03/18/2022 1557   TRIG 108 05/20/2006 0825   HDL 58.40 03/18/2022 1557   HDL 45 01/22/2021 0829   CHOLHDL 4 03/18/2022 1557   VLDL 19.4 03/18/2022 1557   LDLCALC 139 (H) 03/18/2022 1557   LDLCALC 123 (H) 01/22/2021 0829   LDLCALC 136 (H) 12/26/2019 1150   LDLDIRECT 177.0 07/24/2015 1052    Additional studies/ records that were reviewed today include:    Cath 08/11/2016 Conclusion     Prox LAD to Mid LAD lesion, 30 %stenosed. Mid LAD to Dist LAD lesion, 65 %stenosed. Prox RCA to Mid RCA  lesion, 15 %stenosed. The left ventricular systolic function is normal. LV end diastolic pressure is normal. The left ventricular ejection fraction is 55-65% by visual estimate. LV end diastolic pressure is normal.   1. Borderline single vessel obstructive CAD involving the mid LAD. FFR 0.81 2. Normal LV function EF 65% 3. Normal right heart and LV filling pressures 4. Normal Cardiac output   Plan: will refer to CT surgery for consideration of MV repair. The stenosis in the LAD will be discussed. He is asymptomatic and I would favor treating it medically. If he develops symptoms in the future it could be treated with PCI.         Echo 02/15/2017 LV EF: 60% -   65%  Study Conclusions   - Left ventricle: The cavity size was normal. Wall thickness was   normal. Systolic function was normal. The estimated ejection   fraction was in the range of 60% to 65%. - Aortic valve: AV is thickened, calcified with minimally   restricted motion. - Mitral valve: Prolapse fo the posterior mitral leaflet. MR is at   least moderate in intensity Calcified annulus. Mildly thickened   leaflets . - Left atrium: The atrium was mildly dilated.     CABG with MVR 03/24/2017 Preoperative Diagnosis:       Severe Mitral Regurgitation Single-vessel Coronary Artery Disease   Postoperative Diagnosis:    Same   Procedure:        Mitral Valve Repair             Complex valvuloplasty including artificial Gore-tex neochord placement x6             Sorin Carbomedics Annuloflex posterior annuloplasty band (size 28mm, catalog #[id number], serial #[id number])   Coronary Artery Bypass Grafting x 1              Left Internal Mammary Artery to Distal Left Anterior Descending Coronary Artery   Echo 06/10/17: Study Conclusions   - Left ventricle: The cavity size was normal. There was moderate   concentric hypertrophy. Systolic function was vigorous. The   estimated ejection fraction was in the range of 65% to 70%.  Wall   motion was normal; there were no regional wall motion   abnormalities. Doppler parameters are consistent with abnormal   left ventricular relaxation (grade 1 diastolic dysfunction). - Aortic valve: Trileaflet; mildly thickened, mildly calcified   leaflets. Transvalvular velocity was minimally increased. There   was no stenosis. There was no regurgitation. - Mitral valve: S/P mitral valve repair with fixed posterior   leaflet. Transvalvular velocity was elevated. There was no   regurgitation. - Right ventricle: The cavity size was normal. Wall thickness was   normal. Systolic function was normal. - Tricuspid valve: There was no regurgitation. - Pericardium, extracardiac: A mild pericardial effusion was   identified posterior to the heart.  Features were not consistent   with tamponade physiology.   Impressions:   - Since the last study on 02/15/2017 mitral valve is post repair.   Transmitral velocities are elevated with mean gradient 8 mmHg.   LVEF is hyperdynamic.  Echo 02/07/18: Study Conclusions   - Left ventricle: The cavity size was mildly reduced. Wall   thickness was increased in a pattern of mild LVH. Systolic   function was vigorous. The estimated ejection fraction was in the   range of 65% to 70%. - Mitral valve: s/p MV repair. Motion is difficult to see due to   poor acoustic windows Peak and mean gradiaeants through the valve   are 7 and 4 mm Hg respectively. MVA by P T1/2 is 1.75 cm2   consistent with mild MS - Right ventricle: RV is difficult to visualize, even with Definity   OVerall RVEF appears mildly reduced. The cavity size was mildly   dilated. - Right atrium: The atrium was mildly dilated.  Echo 01/29/19: IMPRESSIONS     1. Left ventricular ejection fraction, by visual estimation, is 50 to  55%. The left ventricle has normal function. Normal left ventricular size.  Left ventricular septal wall thickness was mildly increased. Mildly  increased left  ventricular posterior  wall thickness. There is mildly increased left ventricular hypertrophy.   2. Elevated left ventricular end-diastolic pressure.   3. Left ventricular diastolic Doppler parameters are consistent with  pseudonormalization pattern of LV diastolic filling.   4. Global right ventricle has normal systolic function.The right  ventricular size is normal. No increase in right ventricular wall  thickness.   5. Left atrial size was normal.   6. Right atrial size was normal.   7. The mitral valve is normal in structure. Mild mitral valve  regurgitation. Mild mitral stenosis.   8. The tricuspid valve is normal in structure. Tricuspid valve  regurgitation is mild.   9. The aortic valve is normal in structure. Aortic valve regurgitation  was not visualized by color flow Doppler. Structurally normal aortic  valve, with no evidence of sclerosis or stenosis.  10. Mild thickening and calcification of the aortic valve leaflets.  11. The pulmonic valve was normal in structure. Pulmonic valve  regurgitation is trivial by color flow Doppler.  12. Normal pulmonary artery systolic pressure.  13. The inferior vena cava is normal in size with greater than 50%  respiratory variability, suggesting right atrial pressure of 3 mmHg.   ASSESSMENT:    1. Chronic diastolic congestive heart failure (HCC)   2. LVH (left ventricular hypertrophy) due to hypertensive disease, with heart failure (HCC)        PLAN:  In order of problems listed above:  Severe MR s/p mitral valve annuloplasty/repair:  F/U Echo 2020  showed an excellent repair.  Recommend routine SBE prophylaxis.   2.   History of PE now recurrent on 02/06/18. Submassive with RV strain. S/p EKOS and lytic therapy. On Xarelto now.  Will need anticoagulation indefinitely.   3.    Chronic diastolic heart failure: weight is stable. Improved edema on Jardiance.  He does have venous insuffieciency. Will continue lasix PRN.   Continue  sodium restriction and support hose.   4.    S/p CABG x 1: LIMA to LAD. No angina.   5.    Stage IV lung CA. S/p resection with recurrent malignant pleural effusion. On oral chemotherapy.  S/p RT for recurrent lung nodule. Stable.   6.   DM  2: Managed by primary care provider- now on Jardiance. Needs updated A1c.   7.   HLD off statin due to history of elevated CK on Lipitor. On Zetia. Now on Repatha. Will update lipid panel with next blood draw.   8. CKD stage 3a. Followed by Nephrology. Last creatinine 1.45  Follow up in 6 months.  Medication Adjustments/Labs and Tests Ordered: Current medicines are reviewed at length with the patient today.  Concerns regarding medicines are outlined above.  Medication changes, Labs and Tests ordered today are listed in the Patient Instructions below. There are no Patient Instructions on file for this visit.   Signed, Jacee Enerson [place], MD  02/23/2023 2:12 PM    [facility] [address] [place], [place], [place]  [address] Phone: [phone]; Fax: [phone]

## 2023-02-23 ENCOUNTER — Ambulatory Visit: Payer: Medicare PPO | Attending: Cardiology | Admitting: Cardiology

## 2023-02-23 ENCOUNTER — Encounter: Payer: Self-pay | Admitting: Cardiology

## 2023-02-23 VITALS — BP 126/78 | HR 80 | Ht 67.0 in | Wt 224.0 lb

## 2023-02-23 DIAGNOSIS — I110 Hypertensive heart disease with heart failure: Secondary | ICD-10-CM

## 2023-02-23 DIAGNOSIS — Z9889 Other specified postprocedural states: Secondary | ICD-10-CM

## 2023-02-23 DIAGNOSIS — C3491 Malignant neoplasm of unspecified part of right bronchus or lung: Secondary | ICD-10-CM | POA: Diagnosis not present

## 2023-02-23 DIAGNOSIS — I2510 Atherosclerotic heart disease of native coronary artery without angina pectoris: Secondary | ICD-10-CM | POA: Diagnosis not present

## 2023-02-23 DIAGNOSIS — E785 Hyperlipidemia, unspecified: Secondary | ICD-10-CM | POA: Diagnosis not present

## 2023-02-23 DIAGNOSIS — I5032 Chronic diastolic (congestive) heart failure: Secondary | ICD-10-CM | POA: Diagnosis not present

## 2023-02-23 MED ORDER — REPATHA SURECLICK 140 MG/ML ~~LOC~~ SOAJ: 140.0000 mg | SUBCUTANEOUS | 3 refills | Status: DC

## 2023-02-23 MED ORDER — FUROSEMIDE 20 MG PO TABS: 20.0000 mg | ORAL_TABLET | Freq: Every day | ORAL | 2 refills | Status: DC | PRN

## 2023-02-23 MED ORDER — RIVAROXABAN 20 MG PO TABS: 20.0000 mg | ORAL_TABLET | Freq: Every day | ORAL | 3 refills | Status: DC

## 2023-02-23 NOTE — Patient Instructions (Addendum)
Medication Instructions:  continue all current medications *If you need a refill on your cardiac medications before your next appointment, please call your pharmacy*   Lab Work: Lipid Panel , A1C If you have labs (blood work) drawn today and your tests are completely normal, you will receive your results only by: MyChart Message (if you have MyChart) OR A paper copy in the mail If you have any lab test that is abnormal or we need to change your treatment, we will call you to review the results.   Testing/Procedures: None   Follow-Up: At [facility], you and your health needs are our priority.  As part of our continuing mission to provide you with exceptional heart care, we have created designated Provider Care Teams.  These Care Teams include your primary Cardiologist (physician) and Advanced Practice Providers (APPs -  Physician Assistants and Nurse Practitioners) who all work together to provide you with the care you need, when you need it.   Your next appointment:   6 month(s). Call office in Jan 2025 to schedule appt for April 2025  Provider:   Peter [place], MD     Other Instructions Please have labs when you go to Cancer Center. Lab slip given to you at this visit .Please have doctor fax result to Dr. Peter [place] @ [phone]

## 2023-02-24 DIAGNOSIS — I872 Venous insufficiency (chronic) (peripheral): Secondary | ICD-10-CM | POA: Diagnosis not present

## 2023-02-24 DIAGNOSIS — I83812 Varicose veins of left lower extremities with pain: Secondary | ICD-10-CM | POA: Diagnosis not present

## 2023-02-24 DIAGNOSIS — M79605 Pain in left leg: Secondary | ICD-10-CM | POA: Diagnosis not present

## 2023-02-26 ENCOUNTER — Other Ambulatory Visit: Payer: Self-pay

## 2023-02-26 NOTE — Progress Notes (Signed)
Patient selected on questionnaire that he had additional questions for the pharmacist. Reached out to patient and he wished to confirm that his refill would be processed per the responses he provided. Reassured patient that since we have had a change in our patient management software we may be periodically sending questionnaires going forward. He had no further questions.

## 2023-02-26 NOTE — Progress Notes (Signed)
Specialty Pharmacy Refill Coordination Note  James [name] is a 72 y.o. male contacted today regarding refills of specialty medication(s) Osimertinib Mesylate   Patient requested [name] at [facility] at [place] date: 03/03/23   Medication will be filled on 03/02/23.

## 2023-03-03 ENCOUNTER — Other Ambulatory Visit (HOSPITAL_COMMUNITY): Payer: Self-pay

## 2023-03-11 DIAGNOSIS — R7303 Prediabetes: Secondary | ICD-10-CM | POA: Diagnosis not present

## 2023-03-11 DIAGNOSIS — D631 Anemia in chronic kidney disease: Secondary | ICD-10-CM | POA: Diagnosis not present

## 2023-03-11 DIAGNOSIS — I5032 Chronic diastolic (congestive) heart failure: Secondary | ICD-10-CM | POA: Diagnosis not present

## 2023-03-11 DIAGNOSIS — I110 Hypertensive heart disease with heart failure: Secondary | ICD-10-CM | POA: Diagnosis not present

## 2023-03-11 DIAGNOSIS — E1122 Type 2 diabetes mellitus with diabetic chronic kidney disease: Secondary | ICD-10-CM | POA: Diagnosis not present

## 2023-03-11 DIAGNOSIS — I129 Hypertensive chronic kidney disease with stage 1 through stage 4 chronic kidney disease, or unspecified chronic kidney disease: Secondary | ICD-10-CM | POA: Diagnosis not present

## 2023-03-11 DIAGNOSIS — N2581 Secondary hyperparathyroidism of renal origin: Secondary | ICD-10-CM | POA: Diagnosis not present

## 2023-03-11 DIAGNOSIS — N1831 Chronic kidney disease, stage 3a: Secondary | ICD-10-CM | POA: Diagnosis not present

## 2023-03-12 LAB — LIPID PANEL
Chol/HDL Ratio: 2.2 {ratio} (ref 0.0–5.0)
Cholesterol, Total: 121 mg/dL (ref 100–199)
HDL: 56 mg/dL (ref >39)
LDL Chol Calc (NIH): 42 mg/dL (ref 0–99)
Triglycerides: 133 mg/dL (ref 0–149)
VLDL Cholesterol Cal: 23 mg/dL (ref 5–40)

## 2023-03-12 LAB — HEMOGLOBIN A1C
Est. average glucose Bld gHb Est-mCnc: 134 mg/dL
Hgb A1c MFr Bld: 6.3 % — ABNORMAL HIGH (ref 4.8–5.6)

## 2023-03-16 ENCOUNTER — Other Ambulatory Visit: Payer: Self-pay | Admitting: Internal Medicine

## 2023-03-16 DIAGNOSIS — I5032 Chronic diastolic (congestive) heart failure: Secondary | ICD-10-CM

## 2023-03-16 DIAGNOSIS — E118 Type 2 diabetes mellitus with unspecified complications: Secondary | ICD-10-CM

## 2023-03-16 DIAGNOSIS — N1831 Chronic kidney disease, stage 3a: Secondary | ICD-10-CM

## 2023-03-23 ENCOUNTER — Ambulatory Visit (HOSPITAL_COMMUNITY)
Admission: RE | Admit: 2023-03-23 | Discharge: 2023-03-23 | Disposition: A | Discharge status: Home / Self Care | Payer: Medicare PPO | Source: Ambulatory Visit | Attending: Internal Medicine | Admitting: Internal Medicine

## 2023-03-23 ENCOUNTER — Inpatient Hospital Stay: Payer: Medicare PPO | Attending: Internal Medicine

## 2023-03-23 ENCOUNTER — Other Ambulatory Visit: Payer: Self-pay | Admitting: Internal Medicine

## 2023-03-23 DIAGNOSIS — Z79899 Other long term (current) drug therapy: Secondary | ICD-10-CM | POA: Insufficient documentation

## 2023-03-23 DIAGNOSIS — C3490 Malignant neoplasm of unspecified part of unspecified bronchus or lung: Secondary | ICD-10-CM | POA: Diagnosis not present

## 2023-03-23 DIAGNOSIS — K7689 Other specified diseases of liver: Secondary | ICD-10-CM | POA: Diagnosis not present

## 2023-03-23 DIAGNOSIS — Z905 Acquired absence of kidney: Secondary | ICD-10-CM | POA: Insufficient documentation

## 2023-03-23 DIAGNOSIS — N400 Enlarged prostate without lower urinary tract symptoms: Secondary | ICD-10-CM | POA: Diagnosis not present

## 2023-03-23 DIAGNOSIS — I700 Atherosclerosis of aorta: Secondary | ICD-10-CM | POA: Diagnosis not present

## 2023-03-23 DIAGNOSIS — M51369 Other intervertebral disc degeneration, lumbar region without mention of lumbar back pain or lower extremity pain: Secondary | ICD-10-CM | POA: Insufficient documentation

## 2023-03-23 DIAGNOSIS — C3411 Malignant neoplasm of upper lobe, right bronchus or lung: Secondary | ICD-10-CM | POA: Insufficient documentation

## 2023-03-23 DIAGNOSIS — I509 Heart failure, unspecified: Secondary | ICD-10-CM | POA: Insufficient documentation

## 2023-03-23 LAB — CBC WITH DIFFERENTIAL (CANCER CENTER ONLY)
Abs Immature Granulocytes: 0.03 10*3/uL (ref 0.00–0.07)
Basophils Absolute: 0 10*3/uL (ref 0.0–0.1)
Basophils Relative: 1 %
Eosinophils Absolute: 0.2 10*3/uL (ref 0.0–0.5)
Eosinophils Relative: 2 %
HCT: 47.3 % (ref 39.0–52.0)
Hemoglobin: 16.3 g/dL (ref 13.0–17.0)
Immature Granulocytes: 1 %
Lymphocytes Relative: 24 %
Lymphs Abs: 1.5 10*3/uL (ref 0.7–4.0)
MCH: 31.8 pg (ref 26.0–34.0)
MCHC: 34.5 g/dL (ref 30.0–36.0)
MCV: 92.2 fL (ref 80.0–100.0)
Monocytes Absolute: 0.8 10*3/uL (ref 0.1–1.0)
Monocytes Relative: 13 %
Neutro Abs: 3.7 10*3/uL (ref 1.7–7.7)
Neutrophils Relative %: 59 %
Platelet Count: 146 10*3/uL — ABNORMAL LOW (ref 150–400)
RBC: 5.13 MIL/uL (ref 4.22–5.81)
RDW: 15.3 % (ref 11.5–15.5)
WBC Count: 6.1 10*3/uL (ref 4.0–10.5)
nRBC: 0 % (ref 0.0–0.2)

## 2023-03-23 LAB — CMP (CANCER CENTER ONLY)
ALT: 25 U/L (ref 0–44)
AST: 24 U/L (ref 15–41)
Albumin: 4 g/dL (ref 3.5–5.0)
Alkaline Phosphatase: 59 U/L (ref 38–126)
Anion gap: 5 (ref 5–15)
BUN: 23 mg/dL (ref 8–23)
CO2: 26 mmol/L (ref 22–32)
Calcium: 9.1 mg/dL (ref 8.9–10.3)
Chloride: 109 mmol/L (ref 98–111)
Creatinine: 1.37 mg/dL — ABNORMAL HIGH (ref 0.61–1.24)
GFR, Estimated: 54 mL/min — ABNORMAL LOW (ref >60)
Glucose, Bld: 122 mg/dL — ABNORMAL HIGH (ref 70–99)
Potassium: 4.2 mmol/L (ref 3.5–5.1)
Sodium: 140 mmol/L (ref 135–145)
Total Bilirubin: 0.6 mg/dL (ref <1.2)
Total Protein: 6.6 g/dL (ref 6.5–8.1)

## 2023-03-23 MED ORDER — IOHEXOL 300 MG/ML  SOLN
80.0000 mL | Freq: Once | INTRAMUSCULAR | Status: AC | PRN
  Administered 2023-03-23: 80 mL via INTRAVENOUS

## 2023-03-29 ENCOUNTER — Inpatient Hospital Stay: Payer: Medicare PPO | Admitting: Internal Medicine

## 2023-03-29 VITALS — BP 132/74 | HR 86 | Temp 98.7°F | Resp 16 | Ht 67.0 in | Wt 223.2 lb

## 2023-03-29 DIAGNOSIS — Z905 Acquired absence of kidney: Secondary | ICD-10-CM | POA: Diagnosis not present

## 2023-03-29 DIAGNOSIS — C3491 Malignant neoplasm of unspecified part of right bronchus or lung: Secondary | ICD-10-CM

## 2023-03-29 DIAGNOSIS — M51369 Other intervertebral disc degeneration, lumbar region without mention of lumbar back pain or lower extremity pain: Secondary | ICD-10-CM | POA: Diagnosis not present

## 2023-03-29 DIAGNOSIS — Z79899 Other long term (current) drug therapy: Secondary | ICD-10-CM | POA: Diagnosis not present

## 2023-03-29 DIAGNOSIS — C3411 Malignant neoplasm of upper lobe, right bronchus or lung: Secondary | ICD-10-CM | POA: Diagnosis not present

## 2023-03-29 DIAGNOSIS — I509 Heart failure, unspecified: Secondary | ICD-10-CM | POA: Diagnosis not present

## 2023-03-29 NOTE — Progress Notes (Signed)
[facility] Telephone:([phone]) [phone]   Fax:([phone]) [phone]  OFFICE PROGRESS NOTE  Etta Grandchild, MD [address] [place] [address]  DIAGNOSIS: Recurrent non-small cell lung cancer, adenocarcinoma initially diagnosed as stage IA (T1c, N0, M0) non-small cell lung cancer, adenocarcinoma presented with right upper lobe lung nodule in April 2018 with recurrence in June 2019.  Biomarker Findings Microsatellite status - MS-Stable Tumor Mutational Burden - TMB-Low (3 Muts/Mb) Genomic Findings For a complete list of the genes assayed, please refer to the Appendix. EGFR exon 19 deletion ([id number]>C) CDKN2A/B loss NKX2-1 amplification 7 Disease relevant genes with no reportable alterations: KRAS, ALK, BRAF, MET, RET, ERBB2, ROS1   PRIOR THERAPY:  1) Status post right upper lobectomy with lymph node dissection under the care of Dr. Dorris Fetch on 09/02/2016. 2) status post palliative radiotherapy to enlarging right upper lobe lung nodule under the care of Dr. Mitzi Hansen completed on Oct 07, 2021.  CURRENT THERAPY: Tagrisso 80 mg p.o. daily.  First dose started November 05, 2017.  Status post 65 months of treatment.  INTERVAL HISTORY: James [name] 72 y.o. male returns to the clinic today for his 3 months follow-up visit accompanied by his wife.Discussed the use of AI scribe software for clinical note transcription with the patient, who gave verbal consent to proceed.  History of Present Illness   James [name], a 72 year old patient with a history of stage 1A non-small cell lung cancer adenocarcinoma, underwent a right upper lobectomy in April 2018. A recurrence was noted in June 2019, and treatment with [name] was initiated due to a positive EGFR mutation, Exon 19 deletion. The patient has been on Tagrisso for 65 months.  Over the past three months, the patient has experienced intermittent shortness of breath and occasional headaches. He also reports occasional congestion and  coughing. The patient has a history of degenerative disc disease and continues to experience discomfort in the back and hips.  The patient has been managing his weight with the help of Lasix, which was reintroduced into his regimen three days a week by his nephrologist, Dr. [name]. This has resulted in a slight weight loss of one pound since his last visit in October. The patient also reports a reduction in leg swelling since restarting Lasix. He has not been taking any potassium supplements recently, although he has in the past.       MEDICAL HISTORY: Past Medical History:  Diagnosis Date   Adenocarcinoma of right lung, stage 1 (HCC) 09/06/2016   Anxiety    Arthritis    "knees, hips, back" (10/19/2012)   Chronic diastolic congestive heart failure (HCC)    Chronic lower back pain    Colon polyps    10/27/2002, repeat letter 09/17/2007   Coronary artery disease    Coronary artery disease involving native coronary artery of native heart without angina pectoris    Depressive disorder, not elsewhere classified    no meds   Diabetes mellitus without complication (HCC)    diet controlled- no med  (while in hosp 4/18 -elevated cbg   Dyspnea    Fasting hyperglycemia    GERD (gastroesophageal reflux disease)    Heart murmur    Hemoptysis    abnormal CT Chest 01/29/10 - ? new GG changes RUL > not viz on plain cxr 02/26/2010   Hypertension    Mitral regurgitation    severe MR 08/2016   MPN (myeloproliferative neoplasm) (HCC)    1st detected 06/04/1998   Obesity  OSA on CPAP    last sleep study 10 years ago   Other and unspecified hyperlipidemia    Peripheral vascular disease (HCC) 08/2016   after lung surgey small clots in lungs,after hip dvt-5/16   Pneumonia    4/18   Positive PPD 1965   "non reactive in 2012" (10/19/2012)   Routine general medical examination at a health care facility    S/P CABG x 1 03/24/2017   LIMA to LAD   S/P mitral valve repair 03/24/2017   Complex valvuloplasty  including artificial Gore-tex neochord placement x6 and 28 mm Sorin Annuloflex posterior annuloplasty band   Special screening for malignant neoplasm of prostate    Spinal stenosis, unspecified region other than cervical    Wrist pain, left     ALLERGIES:  is allergic to symbicort [budesonide-formoterol fumarate] and amoxicillin.  MEDICATIONS:  Current Outpatient Medications  Medication Sig Dispense Refill   clindamycin (CLEOCIN-T) 1 % external solution Apply topically 2 (two) times daily. 60 mL 0   empagliflozin (JARDIANCE) 10 MG TABS tablet TAKE 1 TABLET BY MOUTH DAILY BEFORE BREAKFAST. 90 tablet 0   Evolocumab (REPATHA SURECLICK) 140 MG/ML SOAJ Inject 140 mg into the skin every 14 (fourteen) days. 6 mL 3   ezetimibe (ZETIA) 10 MG tablet TAKE 1 TABLET BY MOUTH EVERY DAY 90 tablet 3   furosemide (LASIX) 20 MG tablet Take 1 tablet (20 mg total) by mouth daily as needed for edema. 90 tablet 2   LORazepam (ATIVAN) 0.5 MG tablet 1 tablet p.o. 30 minutes before the MRI.  May repeat once if needed. 2 tablet 0   methocarbamol (ROBAXIN) 500 MG tablet Take 1 tablet (500 mg total) by mouth every 8 (eight) hours as needed for muscle spasms. 270 tablet 1   metoprolol tartrate (LOPRESSOR) 25 MG tablet TAKE 1/2 TABLET TWICE A DAY BY MOUTH 90 tablet 3   Multiple Vitamin (MULTIVITAMIN) tablet Take 1 tablet by mouth daily.     Omega-3 Fatty Acids (FISH OIL) 500 MG CAPS Take 500 mg by mouth daily.      osimertinib mesylate (TAGRISSO) 80 MG tablet TAKE 1 TABLET (80 MG TOTAL) BY MOUTH DAILY. 30 tablet 2   rivaroxaban (XARELTO) 20 MG TABS tablet Take 1 tablet (20 mg total) by mouth daily with supper. 90 tablet 3   sildenafil (REVATIO) 20 MG tablet Take 4 tablets (80 mg total) by mouth daily as needed. 60 tablet 5   No current facility-administered medications for this visit.    SURGICAL HISTORY:  Past Surgical History:  Procedure Laterality Date   ANTERIOR CRUCIATE LIGAMENT REPAIR Left 1967   CARDIAC  CATHETERIZATION  2000   CHEST TUBE INSERTION Right 10/19/2012   post bronch   COLONOSCOPY W/ POLYPECTOMY     CORONARY ARTERY BYPASS GRAFT N/A 03/24/2017   Procedure: CORONARY ARTERY BYPASS GRAFTING (CABG)x1 using left internal mammary artery, LIMA-LAD;  Surgeon: Purcell Nails, MD;  Location: [facility] OR;  Service: Open Heart Surgery;  Laterality: N/A;   CORONARY PRESSURE/FFR STUDY N/A 08/11/2016   Procedure: Intravascular Pressure Wire/FFR Study;  Surgeon: Peter M Swaziland, MD;  Location: [facility] INVASIVE CV LAB;  Service: Cardiovascular;  Laterality: N/A;   FLEXIBLE BRONCHOSCOPY  10/19/2012   Flexible video fiberoptic bronchoscopy with electromagnetic navigation and biopsies.   IR ANGIOGRAM PULMONARY BILATERAL SELECTIVE  02/06/2018   IR ANGIOGRAM SELECTIVE EACH ADDITIONAL VESSEL  02/06/2018   IR ANGIOGRAM SELECTIVE EACH ADDITIONAL VESSEL  02/06/2018   IR INFUSION THROMBOL ARTERIAL INITIAL (MS)  02/06/2018   IR INFUSION THROMBOL ARTERIAL INITIAL (MS)  02/06/2018   IR THROMB F/U EVAL ART/VEN FINAL DAY (MS)  02/07/2018   IR US GUIDE VASC ACCESS RIGHT  02/06/2018   LOBECTOMY Right 09/02/2016   Procedure: RIGHT UPPER LOBECTOMY;  Surgeon: Loreli Slot, MD;  Location: [facility] OR;  Service: Thoracic;  Laterality: Right;   LYMPH NODE DISSECTION Right 09/02/2016   Procedure: LYMPH NODE DISSECTION, RIGHT LUNG;  Surgeon: Loreli Slot, MD;  Location: [facility] OR;  Service: Thoracic;  Laterality: Right;   MITRAL VALVE REPAIR N/A 03/24/2017   Procedure: MITRAL VALVE REPAIR (MVR) with Sorin [employer]omedics Annuloflex size 28;  Surgeon: Purcell Nails, MD;  Location: [facility] OR;  Service: Open Heart Surgery;  Laterality: N/A;   RIGHT/LEFT HEART CATH AND CORONARY ANGIOGRAPHY N/A 08/11/2016   Procedure: Right/Left Heart Cath and Coronary Angiography;  Surgeon: Peter M Swaziland, MD;  Location: [facility] INVASIVE CV LAB;  Service: Cardiovascular;  Laterality: N/A;   TEE WITHOUT CARDIOVERSION N/A 07/17/2016   Procedure: TRANSESOPHAGEAL  ECHOCARDIOGRAM (TEE);  Surgeon: Chrystie Nose, MD;  Location: [facility] ENDOSCOPY;  Service: Cardiovascular;  Laterality: N/A;   TEE WITHOUT CARDIOVERSION N/A 03/24/2017   Procedure: TRANSESOPHAGEAL ECHOCARDIOGRAM (TEE);  Surgeon: Purcell Nails, MD;  Location: [facility] OR;  Service: Open Heart Surgery;  Laterality: N/A;   TONSILLECTOMY  1950's   TOTAL HIP ARTHROPLASTY Left 10/05/2014   dr Despina Hick   TOTAL HIP ARTHROPLASTY Left 10/05/2014   Procedure: LEFT TOTAL HIP ARTHROPLASTY ANTERIOR APPROACH;  Surgeon: Ollen Gross, MD;  Location: [facility] OR;  Service: Orthopedics;  Laterality: Left;   VIDEO ASSISTED THORACOSCOPY (VATS)/WEDGE RESECTION Right 09/02/2016   Procedure: VIDEO ASSISTED THORACOSCOPY (VATS)/RIGHT UPPER LOBE WEDGE RESECTION;  Surgeon: Loreli Slot, MD;  Location: [facility] OR;  Service: Thoracic;  Laterality: Right;   VIDEO BRONCHOSCOPY WITH ENDOBRONCHIAL NAVIGATION N/A 10/19/2012   Procedure: VIDEO BRONCHOSCOPY WITH ENDOBRONCHIAL NAVIGATION;  Surgeon: Leslye Peer, MD;  Location: [facility] OR;  Service: Thoracic;  Laterality: N/A;   WRIST RECONSTRUCTION Left 12/2009   'proximal row carpectomy" [name]    REVIEW OF SYSTEMS:  Constitutional: negative Eyes: negative Ears, nose, mouth, throat, and face: negative Respiratory: positive for dyspnea on exertion Cardiovascular: negative Gastrointestinal: negative Genitourinary:negative Integument/breast: negative Hematologic/lymphatic: negative Musculoskeletal:negative Neurological: negative Behavioral/Psych: negative Endocrine: negative Allergic/Immunologic: negative   PHYSICAL EXAMINATION: General appearance: alert, cooperative, and no distress Head: Normocephalic, without obvious abnormality, atraumatic Neck: no adenopathy, no JVD, supple, symmetrical, trachea midline, and thyroid not enlarged, symmetric, no tenderness/mass/nodules Lymph nodes: Cervical, supraclavicular, and axillary nodes normal. Resp: clear to auscultation bilaterally Back:  symmetric, no curvature. ROM normal. No CVA tenderness. Cardio: regular rate and rhythm, S1, S2 normal, no murmur, click, rub or gallop GI: soft, non-tender; bowel sounds normal; no masses,  no organomegaly Extremities: extremities normal, atraumatic, no cyanosis or edema Neurologic: Alert and oriented X 3, normal strength and tone. Normal symmetric reflexes. Normal coordination and gait  ECOG PERFORMANCE STATUS: 1 - Symptomatic but completely ambulatory  Blood pressure 132/74, pulse 86, temperature 98.7 F (37.1 C), temperature source Temporal, resp. rate 16, height 5\' 7"  (1.702 m), weight 223 lb 3.2 oz (101.2 kg), SpO2 98%.  LABORATORY DATA: Lab Results  Component Value Date   WBC 6.1 03/23/2023   HGB 16.3 03/23/2023   HCT 47.3 03/23/2023   MCV 92.2 03/23/2023   PLT 146 (L) 03/23/2023      Chemistry      Component Value Date/Time   NA 140 03/23/2023 0914  NA 142 10/01/2016 1534   K 4.2 03/23/2023 0914   K 4.2 10/01/2016 1534   CL 109 03/23/2023 0914   CO2 26 03/23/2023 0914   CO2 28 10/01/2016 1534   BUN 23 03/23/2023 0914   BUN 15.9 10/01/2016 1534   CREATININE 1.37 (H) 03/23/2023 0914   CREATININE 1.10 02/08/2017 1101   CREATININE 1.5 (H) 10/01/2016 1534      Component Value Date/Time   CALCIUM 9.1 03/23/2023 0914   CALCIUM 9.6 10/01/2016 1534   ALKPHOS 59 03/23/2023 0914   ALKPHOS 65 10/01/2016 1534   AST 24 03/23/2023 0914   AST 20 10/01/2016 1534   ALT 25 03/23/2023 0914   ALT 24 10/01/2016 1534   BILITOT 0.6 03/23/2023 0914   BILITOT 0.46 10/01/2016 1534       RADIOGRAPHIC STUDIES: CT CHEST ABDOMEN PELVIS W CONTRAST  Result Date: 03/29/2023 CLINICAL DATA:  Non-small-cell lung cancer. Restaging. * Tracking Code: BO * EXAM: CT CHEST, ABDOMEN, AND PELVIS WITH CONTRAST TECHNIQUE: Multidetector CT imaging of the chest, abdomen and pelvis was performed following the standard protocol during bolus administration of intravenous contrast. RADIATION DOSE  REDUCTION: This exam was performed according to the departmental dose-optimization program which includes automated exposure control, adjustment of the mA and/or kV according to patient size and/or use of iterative reconstruction technique. CONTRAST:  80mL OMNIPAQUE IOHEXOL 300 MG/ML  SOLN COMPARISON:  09/28/2022 FINDINGS: CT CHEST FINDINGS Cardiovascular: The heart size is normal. No substantial pericardial effusion. Coronary artery calcification is evident. Mild atherosclerotic calcification is noted in the wall of the thoracic aorta. Mediastinum/Nodes: No mediastinal lymphadenopathy. There is no hilar lymphadenopathy. The esophagus has normal imaging features. There is no axillary lymphadenopathy. Lungs/Pleura: Surgical changes noted around the right hilum with infrahilar scarring in the medial right lung, stable. No new suspicious pulmonary nodule or mass. Tiny 3-4 mm anterior left lung nodule seen on images 35 and 37 are stable in the interval. No focal airspace consolidation. No pleural effusion. Musculoskeletal: No worrisome lytic or sclerotic osseous abnormality. CT ABDOMEN PELVIS FINDINGS Hepatobiliary: Stable hepatic cysts in both the left and right lobes. There is no evidence for gallstones, gallbladder wall thickening, or pericholecystic fluid. No intrahepatic or extrahepatic biliary dilation. Pancreas: No focal mass lesion. No dilatation of the main duct. No intraparenchymal cyst. No peripancreatic edema. Spleen: No splenomegaly. No suspicious focal mass lesion. Adrenals/Urinary Tract: No adrenal nodule or mass. Central sinus cysts noted left kidney. Kidneys otherwise unremarkable. No evidence for hydroureter. Bladder is nondistended. Stomach/Bowel: Stomach is unremarkable. No gastric wall thickening. No evidence of outlet obstruction. Duodenum is normally positioned as is the ligament of Treitz. No small bowel wall thickening. No small bowel dilatation. The terminal ileum is normal. The appendix is  normal. No gross colonic mass. No colonic wall thickening. Vascular/Lymphatic: There is mild atherosclerotic calcification of the abdominal aorta without aneurysm. There is no gastrohepatic or hepatoduodenal ligament lymphadenopathy. No retroperitoneal or mesenteric lymphadenopathy. No pelvic sidewall lymphadenopathy. Reproductive: Mild prostate enlargement. Other: No intraperitoneal free fluid. Musculoskeletal: Left hip replacement. No worrisome lytic or sclerotic osseous abnormality. IMPRESSION: 1. Stable exam. No new or progressive findings to suggest recurrent or metastatic disease in the chest, abdomen, or pelvis. 2. Stable tiny 3-4 mm anterior left lung nodules. Likely benign, continued attention on follow-up recommended. 3. Stable hepatic cysts. 4. Mild prostate enlargement. 5.  Aortic Atherosclerosis (ICD10-I70.0). Electronically Signed   By: Kennith Center M.D.   On: 03/29/2023 08:40     ASSESSMENT AND PLAN:  This is a very pleasant 72 years old white male with a stage Ia non-small cell lung cancer status post right upper lobectomy with lymph node dissection on September 02, 2016. The patient he was on observation since that time. Unfortunately there are some concerning findings with nodular density in the posterior right lower lobe as well as increase and right pleural effusion suspicious for disease recurrence. He underwent ultrasound-guided right thoracentesis and the cytology of the pleural fluid was consistent with recurrent adenocarcinoma. He had molecular studies performed by [facility] 1 that showed positive EGFR mutation with deletion 19. The patient was started on treatment with Tagrisso 80 mg p.o. daily on 11/05/2017.  He status post 65 months of treatment. He recently underwent SBRT to the enlarging right lung nodule under the care of Dr. Mitzi Hansen. The patient has been tolerating his treatment with Tagrisso fairly well. He had repeat CT scan of the chest, abdomen and pelvis performed recently.   I personally and independently reviewed the scan and discussed the result with the patient and his wife. His scan showed stable disease with no concerning findings for new or progressive findings.    Non-Small Cell Lung Cancer (NSCLC) with EGFR Mutation Stage 1A adenocarcinoma diagnosed in April 2018. Underwent right upper lobectomy. Recurrence in June 2019 with positive EGFR mutation (Exon 19). On Tagrisso for 65 months with stable disease. No evidence of progression on recent scans. Occasional shortness of breath, headaches, and mild congestion. Discussed continuation of Tagrisso given stable disease and good tolerance. - Continue Tagrisso - Follow-up in three months with lab work only  Congestive Heart Failure Managed with Lasix. Recent weight reduction after resuming Lasix three times a week as per nephrologist's advice. No significant leg swelling. Potassium levels stable, no need for supplements. - Continue Lasix as prescribed - Monitor potassium levels regularly - Follow-up with nephrologist as needed  Degenerative Disc Disease Chronic back and hip pain. Symptoms persist but are well-managed. No new interventions required. - Monitor symptoms - Consider pain management options if symptoms worsen  General Health Maintenance  - Monitor general health and potassium levels - Encourage reporting of new symptoms or concerns  Follow-up - Schedule follow-up appointment in three months - Perform lab work at next visit.   The patient was advised to call immediately if he has any other concerning symptoms in the interval. The patient voices understanding of current disease status and treatment options and is in agreement with the current care plan. All questions were answered. The patient knows to call the clinic with any problems, questions or concerns. We can certainly see the patient much sooner if necessary. The total time spent in the appointment was 30 minutes.  Disclaimer: This note  was dictated with voice recognition software. Similar sounding words can inadvertently be transcribed and may not be corrected upon review.

## 2023-03-30 ENCOUNTER — Other Ambulatory Visit: Payer: Self-pay

## 2023-03-31 ENCOUNTER — Other Ambulatory Visit: Payer: Self-pay | Admitting: Physician Assistant

## 2023-03-31 ENCOUNTER — Other Ambulatory Visit (HOSPITAL_COMMUNITY): Payer: Self-pay

## 2023-03-31 ENCOUNTER — Encounter: Payer: Self-pay | Admitting: Internal Medicine

## 2023-03-31 ENCOUNTER — Other Ambulatory Visit: Payer: Self-pay

## 2023-03-31 ENCOUNTER — Ambulatory Visit: Payer: Medicare PPO | Admitting: Internal Medicine

## 2023-03-31 VITALS — BP 136/76 | HR 89 | Temp 98.3°F | Resp 16 | Ht 67.0 in | Wt 225.8 lb

## 2023-03-31 DIAGNOSIS — N401 Enlarged prostate with lower urinary tract symptoms: Secondary | ICD-10-CM | POA: Diagnosis not present

## 2023-03-31 DIAGNOSIS — E118 Type 2 diabetes mellitus with unspecified complications: Secondary | ICD-10-CM

## 2023-03-31 DIAGNOSIS — Z0000 Encounter for general adult medical examination without abnormal findings: Secondary | ICD-10-CM

## 2023-03-31 DIAGNOSIS — N138 Other obstructive and reflux uropathy: Secondary | ICD-10-CM

## 2023-03-31 DIAGNOSIS — Z23 Encounter for immunization: Secondary | ICD-10-CM | POA: Diagnosis not present

## 2023-03-31 DIAGNOSIS — D696 Thrombocytopenia, unspecified: Secondary | ICD-10-CM

## 2023-03-31 DIAGNOSIS — N1831 Chronic kidney disease, stage 3a: Secondary | ICD-10-CM | POA: Diagnosis not present

## 2023-03-31 DIAGNOSIS — K635 Polyp of colon: Secondary | ICD-10-CM | POA: Insufficient documentation

## 2023-03-31 DIAGNOSIS — Z0001 Encounter for general adult medical examination with abnormal findings: Secondary | ICD-10-CM

## 2023-03-31 DIAGNOSIS — C3491 Malignant neoplasm of unspecified part of right bronchus or lung: Secondary | ICD-10-CM

## 2023-03-31 LAB — URINALYSIS, ROUTINE W REFLEX MICROSCOPIC
Bilirubin Urine: NEGATIVE
Hgb urine dipstick: NEGATIVE
Ketones, ur: NEGATIVE
Leukocytes,Ua: NEGATIVE
Nitrite: NEGATIVE
RBC / HPF: NONE SEEN (ref 0–?)
Specific Gravity, Urine: 1.01 (ref 1.000–1.030)
Total Protein, Urine: NEGATIVE
Urine Glucose: >=1000 — AB
Urobilinogen, UA: 0.2 (ref 0.0–1.0)
WBC, UA: NONE SEEN (ref 0–?)
pH: 6 (ref 5.0–8.0)

## 2023-03-31 LAB — MICROALBUMIN / CREATININE URINE RATIO
Creatinine,U: 53.5 mg/dL
Microalb Creat Ratio: 1.3 mg/g (ref 0.0–30.0)
Microalb, Ur: <0.7 mg/dL (ref 0.0–1.9)

## 2023-03-31 LAB — PSA: PSA: 2.06 ng/mL (ref 0.10–4.00)

## 2023-03-31 MED ORDER — OSIMERTINIB MESYLATE 80 MG PO TABS
ORAL_TABLET | Freq: Every day | ORAL | 2 refills | Status: DC
  Filled 2023-03-31: qty 30, 30d supply, fill #0
  Filled 2023-04-22: qty 30, 30d supply, fill #1
  Filled 2023-05-26: qty 30, 30d supply, fill #2

## 2023-03-31 NOTE — Progress Notes (Unsigned)
Subjective:  Patient ID: James [name], male    DOB: 22-Aug-1950  Age: 72 y.o. MRN: [id number]  CC: Annual Exam, Hypertension, Diabetes, and Hyperlipidemia   HPI James [name] presents for a CPX and f/up ----  Discussed the use of AI scribe software for clinical note transcription with the patient, who gave verbal consent to proceed.  History of Present Illness   The patient recently had a CT scan which showed a mildly enlarged prostate. He denies any urinary symptoms such as pain or difficulty urinating. He also reports a history of back and hip pain, which has been worsening, and he is considering seeing an orthopedist.  The patient has been experiencing fluctuations in bowel movements, alternating between constipation and loose stools, which he attributes to his medication, Targriso. He denies any blood in his stool. He has a history of polyps found on previous colonoscopies but is unsure when his last colonoscopy was performed.  He also reports issues with fluid retention when taking prednisone for his back pain, which was managed with Lasix and Jardiance. He is considering increasing his Lasix dosage if he needs to take prednisone again.  The patient has been receiving regular follow-ups with his cardiologist, nephrologist, and oncologist, and all recent visits have been unremarkable. He is considering getting the COVID and RSV vaccines, as well as the shingles vaccine, as he has a history of shingles.       Outpatient Medications Prior to Visit  Medication Sig Dispense Refill   clindamycin (CLEOCIN-T) 1 % external solution Apply topically 2 (two) times daily. 60 mL 0   empagliflozin (JARDIANCE) 10 MG TABS tablet TAKE 1 TABLET BY MOUTH DAILY BEFORE BREAKFAST. 90 tablet 0   Evolocumab (REPATHA SURECLICK) 140 MG/ML SOAJ Inject 140 mg into the skin every 14 (fourteen) days. 6 mL 3   ezetimibe (ZETIA) 10 MG tablet TAKE 1 TABLET BY MOUTH EVERY DAY 90 tablet 3   furosemide (LASIX) 20 MG  tablet Take 1 tablet (20 mg total) by mouth daily as needed for edema. (Patient taking differently: Take 20 mg by mouth 3 (three) times a week.) 90 tablet 2   methocarbamol (ROBAXIN) 500 MG tablet Take 1 tablet (500 mg total) by mouth every 8 (eight) hours as needed for muscle spasms. 270 tablet 1   metoprolol tartrate (LOPRESSOR) 25 MG tablet TAKE 1/2 TABLET TWICE A DAY BY MOUTH 90 tablet 3   Multiple Vitamin (MULTIVITAMIN) tablet Take 1 tablet by mouth daily.     Omega-3 Fatty Acids (FISH OIL) 500 MG CAPS Take 500 mg by mouth daily.      rivaroxaban (XARELTO) 20 MG TABS tablet Take 1 tablet (20 mg total) by mouth daily with supper. 90 tablet 3   sildenafil (REVATIO) 20 MG tablet Take 4 tablets (80 mg total) by mouth daily as needed. 60 tablet 5   osimertinib mesylate (TAGRISSO) 80 MG tablet TAKE 1 TABLET (80 MG TOTAL) BY MOUTH DAILY. 30 tablet 2   LORazepam (ATIVAN) 0.5 MG tablet 1 tablet p.o. 30 minutes before the MRI.  May repeat once if needed. 2 tablet 0   No facility-administered medications prior to visit.    ROS Review of Systems  Constitutional:  Negative for appetite change, fatigue and fever.  HENT: Negative.    Eyes:  Negative for visual disturbance.  Respiratory:  Negative for cough, chest tightness, shortness of breath, wheezing and stridor.   Cardiovascular:  Positive for leg swelling. Negative for chest pain.  Gastrointestinal:  Negative for abdominal pain, diarrhea, nausea and vomiting.  Genitourinary: Negative.  Negative for difficulty urinating.  Musculoskeletal:  Positive for arthralgias, back pain and gait problem. Negative for joint swelling and myalgias.  Skin: Negative.   Neurological:  Negative for dizziness and weakness.  Hematological:  Negative for adenopathy. Does not bruise/bleed easily.  Psychiatric/Behavioral:  Positive for confusion and decreased concentration. The patient is not nervous/anxious.     Objective:  BP 136/76 (BP Location: Left Arm, Patient  Position: Sitting, Cuff Size: Normal)   Pulse 89   Temp 98.3 F (36.8 C) (Oral)   Resp 16   Ht 5\' 7"  (1.702 m)   Wt 225 lb 12.8 oz (102.4 kg)   SpO2 93%   BMI 35.37 kg/m   BP Readings from Last 3 Encounters:  03/31/23 136/76  03/29/23 132/74  02/23/23 126/78    Wt Readings from Last 3 Encounters:  03/31/23 225 lb 12.8 oz (102.4 kg)  03/29/23 223 lb 3.2 oz (101.2 kg)  02/23/23 224 lb (101.6 kg)    Physical Exam Vitals reviewed.  HENT:     Mouth/Throat:     Mouth: Mucous membranes are moist.  Eyes:     General: No scleral icterus.    Conjunctiva/sclera: Conjunctivae normal.  Cardiovascular:     Rate and Rhythm: Normal rate and regular rhythm.     Heart sounds: Murmur heard.     Systolic murmur is present with a grade of 2/6.     No diastolic murmur is present.     No friction rub. No gallop.  Pulmonary:     Effort: Pulmonary effort is normal.     Breath sounds: No stridor. No wheezing, rhonchi or rales.  Abdominal:     General: Abdomen is flat.     Palpations: There is no mass.     Tenderness: There is no abdominal tenderness. There is no guarding.     Hernia: No hernia is present.  Genitourinary:    Comments: GU/rectal deferred - unable to get on the table Musculoskeletal:     Cervical back: Neck supple.     Right lower leg: 1+ Pitting Edema present.     Left lower leg: 1+ Pitting Edema present.  Lymphadenopathy:     Cervical: No cervical adenopathy.  Skin:    Coloration: Skin is not jaundiced.     Findings: No bruising, lesion or rash.  Neurological:     General: No focal deficit present.     Mental Status: He is alert. Mental status is at baseline.  Psychiatric:        Mood and Affect: Mood normal.        Behavior: Behavior normal.     Lab Results  Component Value Date   WBC 6.1 03/23/2023   HGB 16.3 03/23/2023   HCT 47.3 03/23/2023   PLT 146 (L) 03/23/2023   GLUCOSE 122 (H) 03/23/2023   CHOL 121 03/11/2023   TRIG 133 03/11/2023   HDL 56  03/11/2023   LDLDIRECT 177.0 07/24/2015   LDLCALC 42 03/11/2023   ALT 25 03/23/2023   AST 24 03/23/2023   NA 140 03/23/2023   K 4.2 03/23/2023   CL 109 03/23/2023   CREATININE 1.37 (H) 03/23/2023   BUN 23 03/23/2023   CO2 26 03/23/2023   TSH 2.39 03/18/2022   PSA 2.06 03/31/2023   INR 1.28 02/07/2018   HGBA1C 6.3 (H) 03/11/2023   MICROALBUR <0.7 03/31/2023    CT CHEST ABDOMEN PELVIS W CONTRAST  Result Date: 03/29/2023 CLINICAL DATA:  Non-small-cell lung cancer. Restaging. * Tracking Code: BO * EXAM: CT CHEST, ABDOMEN, AND PELVIS WITH CONTRAST TECHNIQUE: Multidetector CT imaging of the chest, abdomen and pelvis was performed following the standard protocol during bolus administration of intravenous contrast. RADIATION DOSE REDUCTION: This exam was performed according to the departmental dose-optimization program which includes automated exposure control, adjustment of the mA and/or kV according to patient size and/or use of iterative reconstruction technique. CONTRAST:  80mL OMNIPAQUE IOHEXOL 300 MG/ML  SOLN COMPARISON:  09/28/2022 FINDINGS: CT CHEST FINDINGS Cardiovascular: The heart size is normal. No substantial pericardial effusion. Coronary artery calcification is evident. Mild atherosclerotic calcification is noted in the wall of the thoracic aorta. Mediastinum/Nodes: No mediastinal lymphadenopathy. There is no hilar lymphadenopathy. The esophagus has normal imaging features. There is no axillary lymphadenopathy. Lungs/Pleura: Surgical changes noted around the right hilum with infrahilar scarring in the medial right lung, stable. No new suspicious pulmonary nodule or mass. Tiny 3-4 mm anterior left lung nodule seen on images 35 and 37 are stable in the interval. No focal airspace consolidation. No pleural effusion. Musculoskeletal: No worrisome lytic or sclerotic osseous abnormality. CT ABDOMEN PELVIS FINDINGS Hepatobiliary: Stable hepatic cysts in both the left and right lobes. There is no  evidence for gallstones, gallbladder wall thickening, or pericholecystic fluid. No intrahepatic or extrahepatic biliary dilation. Pancreas: No focal mass lesion. No dilatation of the main duct. No intraparenchymal cyst. No peripancreatic edema. Spleen: No splenomegaly. No suspicious focal mass lesion. Adrenals/Urinary Tract: No adrenal nodule or mass. Central sinus cysts noted left kidney. Kidneys otherwise unremarkable. No evidence for hydroureter. Bladder is nondistended. Stomach/Bowel: Stomach is unremarkable. No gastric wall thickening. No evidence of outlet obstruction. Duodenum is normally positioned as is the ligament of Treitz. No small bowel wall thickening. No small bowel dilatation. The terminal ileum is normal. The appendix is normal. No gross colonic mass. No colonic wall thickening. Vascular/Lymphatic: There is mild atherosclerotic calcification of the abdominal aorta without aneurysm. There is no gastrohepatic or hepatoduodenal ligament lymphadenopathy. No retroperitoneal or mesenteric lymphadenopathy. No pelvic sidewall lymphadenopathy. Reproductive: Mild prostate enlargement. Other: No intraperitoneal free fluid. Musculoskeletal: Left hip replacement. No worrisome lytic or sclerotic osseous abnormality. IMPRESSION: 1. Stable exam. No new or progressive findings to suggest recurrent or metastatic disease in the chest, abdomen, or pelvis. 2. Stable tiny 3-4 mm anterior left lung nodules. Likely benign, continued attention on follow-up recommended. 3. Stable hepatic cysts. 4. Mild prostate enlargement. 5.  Aortic Atherosclerosis (ICD10-I70.0). Electronically Signed   By: Kennith Center M.D.   On: 03/29/2023 08:40    Assessment & Plan:   Stage 3a chronic kidney disease (HCC)- Renal function is stable. -     Microalbumin / creatinine urine ratio; Future -     Urinalysis, Routine w reflex microscopic; Future  Type II diabetes mellitus with manifestations (HCC)- Blood sugar is well controlled. -      Microalbumin / creatinine urine ratio; Future -     Urinalysis, Routine w reflex microscopic; Future -     HM Diabetes Foot Exam  Need for immunization against influenza -     Flu Vaccine Trivalent High Dose (Fluad)  Encounter for general adult medical examination with abnormal findings- Exam completed, labs reviewed, vaccines reviewed and updated, cancer screenings addressed, pt ed material was given.   BPH with obstruction/lower urinary tract symptoms -     PSA; Future  Polyp of colon, unspecified part of colon, unspecified type -  Ambulatory referral to Gastroenterology  Thrombocytopenia (HCC)- No bleeding/bruising.     Follow-up: Return in about 6 months (around 09/28/2023).  Sanda Linger, MD

## 2023-03-31 NOTE — Patient Instructions (Signed)
Health Maintenance, Male Adopting a healthy lifestyle and getting preventive care are important in promoting health and wellness. Ask your health care provider about: The right schedule for you to have regular tests and exams. Things you can do on your own to prevent diseases and keep yourself healthy. What should I know about diet, weight, and exercise? Eat a healthy diet  Eat a diet that includes plenty of vegetables, fruits, low-fat dairy products, and lean protein. Do not eat a lot of foods that are high in solid fats, added sugars, or sodium. Maintain a healthy weight Body mass index (BMI) is a measurement that can be used to identify possible weight problems. It estimates body fat based on height and weight. Your health care provider can help determine your BMI and help you achieve or maintain a healthy weight. Get regular exercise Get regular exercise. This is one of the most important things you can do for your health. Most adults should: Exercise for at least 150 minutes each week. The exercise should increase your heart rate and make you sweat (moderate-intensity exercise). Do strengthening exercises at least twice a week. This is in addition to the moderate-intensity exercise. Spend less time sitting. Even light physical activity can be beneficial. Watch cholesterol and blood lipids Have your blood tested for lipids and cholesterol at 73 years of age, then have this test every 5 years. You may need to have your cholesterol levels checked more often if: Your lipid or cholesterol levels are high. You are older than 73 years of age. You are at high risk for heart disease. What should I know about cancer screening? Many types of cancers can be detected early and may often be prevented. Depending on your health history and family history, you may need to have cancer screening at various ages. This may include screening for: Colorectal cancer. Prostate cancer. Skin cancer. Lung  cancer. What should I know about heart disease, diabetes, and high blood pressure? Blood pressure and heart disease High blood pressure causes heart disease and increases the risk of stroke. This is more likely to develop in people who have high blood pressure readings or are overweight. Talk with your health care provider about your target blood pressure readings. Have your blood pressure checked: Every 3-5 years if you are 18-39 years of age. Every year if you are 40 years old or older. If you are between the ages of 65 and 75 and are a current or former smoker, ask your health care provider if you should have a one-time screening for abdominal aortic aneurysm (AAA). Diabetes Have regular diabetes screenings. This checks your fasting blood sugar level. Have the screening done: Once every three years after age 45 if you are at a normal weight and have a low risk for diabetes. More often and at a younger age if you are overweight or have a high risk for diabetes. What should I know about preventing infection? Hepatitis B If you have a higher risk for hepatitis B, you should be screened for this virus. Talk with your health care provider to find out if you are at risk for hepatitis B infection. Hepatitis C Blood testing is recommended for: Everyone born from 1945 through 1965. Anyone with known risk factors for hepatitis C. Sexually transmitted infections (STIs) You should be screened each year for STIs, including gonorrhea and chlamydia, if: You are sexually active and are younger than 73 years of age. You are older than 73 years of age and your   health care provider tells you that you are at risk for this type of infection. Your sexual activity has changed since you were last screened, and you are at increased risk for chlamydia or gonorrhea. Ask your health care provider if you are at risk. Ask your health care provider about whether you are at high risk for HIV. Your health care provider  may recommend a prescription medicine to help prevent HIV infection. If you choose to take medicine to prevent HIV, you should first get tested for HIV. You should then be tested every 3 months for as long as you are taking the medicine. Follow these instructions at home: Alcohol use Do not drink alcohol if your health care provider tells you not to drink. If you drink alcohol: Limit how much you have to 0-2 drinks a day. Know how much alcohol is in your drink. In the [place] one drink equals one 12 oz bottle of beer (355 mL), one 5 oz glass of wine (148 mL), or one 1 oz glass of hard liquor (44 mL). Lifestyle Do not use any products that contain nicotine or tobacco. These products include cigarettes, chewing tobacco, and vaping devices, such as e-cigarettes. If you need help quitting, ask your health care provider. Do not use street drugs. Do not share needles. Ask your health care provider for help if you need support or information about quitting drugs. General instructions Schedule regular health, dental, and eye exams. Stay current with your vaccines. Tell your health care provider if: You often feel depressed. You have ever been abused or do not feel safe at home. Summary Adopting a healthy lifestyle and getting preventive care are important in promoting health and wellness. Follow your health care provider's instructions about healthy diet, exercising, and getting tested or screened for diseases. Follow your health care provider's instructions on monitoring your cholesterol and blood pressure. This information is not intended to replace advice given to you by your health care provider. Make sure you discuss any questions you have with your health care provider. Document Revised: 09/16/2020 Document Reviewed: 09/16/2020 Elsevier Patient Education  2024 [employer]  

## 2023-03-31 NOTE — Progress Notes (Signed)
Specialty Pharmacy Refill Coordination Note  James [name] is a 72 y.o. male contacted today regarding refills of specialty medication(s) Osimertinib Mesylate   Patient requested James [name] at [facility] at [place] date: 04/05/23   Medication will be filled on 04/02/23. *Pending Refill Request* Call if any delays.

## 2023-04-02 ENCOUNTER — Other Ambulatory Visit (HOSPITAL_COMMUNITY): Payer: Self-pay

## 2023-04-22 ENCOUNTER — Other Ambulatory Visit (HOSPITAL_COMMUNITY): Payer: Self-pay

## 2023-04-22 ENCOUNTER — Other Ambulatory Visit (HOSPITAL_COMMUNITY): Payer: Self-pay | Admitting: Pharmacy Technician

## 2023-04-22 NOTE — Progress Notes (Signed)
Specialty Pharmacy Refill Coordination Note  James [name] is a 72 y.o. male contacted today regarding refills of specialty medication(s) Osimertinib Mesylate [name])   Patient requested Pickup at [facility] at [place] date: 04/28/23   Medication will be filled on 04/27/23.

## 2023-04-27 ENCOUNTER — Other Ambulatory Visit: Payer: Self-pay

## 2023-04-28 ENCOUNTER — Other Ambulatory Visit (HOSPITAL_COMMUNITY): Payer: Self-pay

## 2023-05-08 ENCOUNTER — Other Ambulatory Visit: Payer: Self-pay | Admitting: Internal Medicine

## 2023-05-25 ENCOUNTER — Other Ambulatory Visit: Payer: Self-pay

## 2023-05-26 ENCOUNTER — Other Ambulatory Visit: Payer: Self-pay

## 2023-05-26 NOTE — Progress Notes (Signed)
 Specialty Pharmacy Refill Coordination Note  James [name] is a 73 y.o. male contacted today regarding refills of specialty medication(s) Osimertinib  Mesylate (TAGRISSO )   Patient requested [name] at [facility] at [place] date: 06/02/23   Medication will be filled on 01.21.25.

## 2023-06-01 ENCOUNTER — Other Ambulatory Visit: Payer: Self-pay

## 2023-06-13 ENCOUNTER — Other Ambulatory Visit: Payer: Self-pay | Admitting: Internal Medicine

## 2023-06-13 DIAGNOSIS — N1831 Chronic kidney disease, stage 3a: Secondary | ICD-10-CM

## 2023-06-13 DIAGNOSIS — I5032 Chronic diastolic (congestive) heart failure: Secondary | ICD-10-CM

## 2023-06-13 DIAGNOSIS — E118 Type 2 diabetes mellitus with unspecified complications: Secondary | ICD-10-CM

## 2023-06-25 ENCOUNTER — Other Ambulatory Visit: Payer: Self-pay

## 2023-06-25 ENCOUNTER — Other Ambulatory Visit: Payer: Self-pay | Admitting: Physician Assistant

## 2023-06-25 ENCOUNTER — Other Ambulatory Visit (HOSPITAL_COMMUNITY): Payer: Self-pay

## 2023-06-25 DIAGNOSIS — C3491 Malignant neoplasm of unspecified part of right bronchus or lung: Secondary | ICD-10-CM

## 2023-06-25 MED ORDER — OSIMERTINIB MESYLATE 80 MG PO TABS
ORAL_TABLET | Freq: Every day | ORAL | 2 refills | Status: DC
  Filled 2023-06-25: qty 30, 30d supply, fill #0

## 2023-06-25 NOTE — Progress Notes (Signed)
Specialty Pharmacy Refill Coordination Note  James [name] is a 73 y.o. male contacted today regarding refills of specialty medication(s) Osimertinib Mesylate [name])   Patient requested [name] at [facility] at [place] date: 06/28/23   Medication will be filled on 06/27/23.   This fill date is pending response to refill request from provider. Patient is aware and if they have not received fill by intended date, they must follow up with pharmacy.

## 2023-06-25 NOTE — Progress Notes (Signed)
Specialty Pharmacy Ongoing Clinical Assessment Note  James [name] is a 73 y.o. male who is being followed by the specialty pharmacy service for [facility] Oncology   Patient's specialty medication(s) reviewed today: Osimertinib Mesylate (TAGRISSO)   Missed doses in the last 4 weeks: 0   Patient/Caregiver did not have any additional questions or concerns.   Therapeutic benefit summary: Patient is achieving benefit (03/29/23 per chart: On Tagrisso for 65 months with stable disease. No evidence of progression on recent scans. Occasional shortness of breath, headaches, and mild congestion. Discussed continuation of Tagrisso given stable disease and good tolerance.)   Adverse events/side effects summary: No adverse events/side effects   Patient's therapy is appropriate to: Continue    Goals Addressed             This Visit's Progress    Stabilization of disease       Patient is on track. Patient will maintain adherence         Follow up:  6 months  Bobette Mo Specialty Pharmacist

## 2023-06-28 ENCOUNTER — Other Ambulatory Visit: Payer: Self-pay

## 2023-06-28 ENCOUNTER — Other Ambulatory Visit (HOSPITAL_COMMUNITY): Payer: Self-pay

## 2023-06-28 DIAGNOSIS — C3491 Malignant neoplasm of unspecified part of right bronchus or lung: Secondary | ICD-10-CM

## 2023-06-28 MED ORDER — OSIMERTINIB MESYLATE 80 MG PO TABS
ORAL_TABLET | Freq: Every day | ORAL | 2 refills | Status: DC
  Filled 2023-06-28 (×2): qty 30, 30d supply, fill #0
  Filled 2023-07-15: qty 30, 30d supply, fill #1
  Filled 2023-08-24: qty 30, 30d supply, fill #2

## 2023-06-30 ENCOUNTER — Ambulatory Visit: Payer: Medicare PPO

## 2023-06-30 ENCOUNTER — Telehealth: Payer: Self-pay

## 2023-06-30 VITALS — Ht 67.0 in | Wt 225.0 lb

## 2023-06-30 DIAGNOSIS — Z0000 Encounter for general adult medical examination without abnormal findings: Secondary | ICD-10-CM

## 2023-06-30 DIAGNOSIS — Z1212 Encounter for screening for malignant neoplasm of rectum: Secondary | ICD-10-CM | POA: Diagnosis not present

## 2023-06-30 DIAGNOSIS — Z1211 Encounter for screening for malignant neoplasm of colon: Secondary | ICD-10-CM

## 2023-06-30 NOTE — Telephone Encounter (Signed)
Patient is due for a colonoscopy, however, he is asking does he still need one due to him seeing Dr. Arbutus Ped every 4 months.  Dr. Yetta Barre has placed a referral for patient to have a colonoscopy on 03/31/2023.  He would like a message to be left in Mychart in regards to an answer.

## 2023-06-30 NOTE — Progress Notes (Cosign Needed Addendum)
 Subjective:   James [name] is a 73 y.o. male who presents for Medicare Annual/Subsequent preventive examination.  Visit Complete: Virtual I connected with  James [name] on 06/30/23 by a audio enabled telemedicine application and verified that I am speaking with the correct person using two identifiers.  Patient Location: Home  Provider Location: Home Office  I discussed the limitations of evaluation and management by telemedicine. The patient expressed understanding and agreed to proceed.  Vital Signs: Because this visit was a virtual/telehealth visit, some criteria may be missing or patient reported. Any vitals not documented were not able to be obtained and vitals that have been documented are patient reported.  Patient Medicare AWV questionnaire was completed by the patient on 06/23/2023; I have confirmed that all information answered by patient is correct and no changes since this date.  Cardiac Risk Factors include: advanced age (>68men, >76 women);Other (see comment);male gender;dyslipidemia, Risk factor comments: CAD, CHF, LVH, OSA, BPH, CRD     Objective:    Today's Vitals   06/23/23 1140 06/30/23 1127  Weight:  225 lb (102.1 kg)  Height:  5\' 7"  (1.702 m)  PainSc: 2     Body mass index is 35.24 kg/m.     06/30/2023   11:35 AM 07/02/2022   11:26 AM 11/03/2021   11:57 AM 09/04/2021   10:36 AM 09/01/2021   11:14 AM 03/03/2021   10:28 AM 02/15/2020   11:44 AM  Advanced Directives  Does Patient Have a Medical Advance Directive? Yes No No No No No No  Type of Estate agent of [place];Living will        Does patient want to make changes to medical advance directive? No - Patient declined        Copy of Healthcare Power of Attorney in Chart? Yes - validated most recent copy scanned in chart (See row information)        Would patient like information on creating a medical advance directive? No - Patient declined No - Patient declined No - Patient  declined No - Patient declined No - Patient declined No - Patient declined No - Patient declined    Current Medications (verified) Outpatient Encounter Medications as of 06/30/2023  Medication Sig   clindamycin (CLEOCIN T) 1 % external solution APPLY TOPICALLY TWICE A DAY   Evolocumab (REPATHA SURECLICK) 140 MG/ML SOAJ Inject 140 mg into the skin every 14 (fourteen) days.   ezetimibe (ZETIA) 10 MG tablet TAKE 1 TABLET BY MOUTH EVERY DAY   furosemide (LASIX) 20 MG tablet Take 1 tablet (20 mg total) by mouth daily as needed for edema. (Patient taking differently: Take 20 mg by mouth 3 (three) times a week.)   JARDIANCE 10 MG TABS tablet TAKE 1 TABLET BY MOUTH DAILY BEFORE BREAKFAST.   methocarbamol (ROBAXIN) 500 MG tablet Take 1 tablet (500 mg total) by mouth every 8 (eight) hours as needed for muscle spasms.   metoprolol tartrate (LOPRESSOR) 25 MG tablet TAKE 1/2 TABLET TWICE A DAY BY MOUTH   Multiple Vitamin (MULTIVITAMIN) tablet Take 1 tablet by mouth daily.   Omega-3 Fatty Acids (FISH OIL) 500 MG CAPS Take 500 mg by mouth daily.    osimertinib mesylate (TAGRISSO) 80 MG tablet TAKE 1 TABLET (80 MG TOTAL) BY MOUTH DAILY.   rivaroxaban (XARELTO) 20 MG TABS tablet Take 1 tablet (20 mg total) by mouth daily with supper.   sildenafil (REVATIO) 20 MG tablet Take 4 tablets (80 mg total) by mouth  daily as needed.   No facility-administered encounter medications on file as of 06/30/2023.    Allergies (verified) Symbicort [budesonide-formoterol fumarate] and Amoxicillin   History: Past Medical History:  Diagnosis Date   Adenocarcinoma of right lung, stage 1 (HCC) 09/06/2016   Anxiety    Arthritis    "knees, hips, back" (10/19/2012)   Chronic diastolic congestive heart failure (HCC)    Chronic lower back pain    Colon polyps    10/27/2002, repeat letter 09/17/2007   Coronary artery disease    Coronary artery disease involving native coronary artery of native heart without angina pectoris     Depressive disorder, not elsewhere classified    no meds   Diabetes mellitus without complication (HCC)    diet controlled- no med  (while in hosp 4/18 -elevated cbg   Dyspnea    Fasting hyperglycemia    GERD (gastroesophageal reflux disease)    Heart murmur    Hemoptysis    abnormal CT Chest 01/29/10 - ? new GG changes RUL > not viz on plain cxr 02/26/2010   Hypertension    Mitral regurgitation    severe MR 08/2016   MPN (myeloproliferative neoplasm) (HCC)    1st detected 06/04/1998   Obesity    OSA on CPAP    last sleep study 10 years ago   Other and unspecified hyperlipidemia    Peripheral vascular disease (HCC) 08/2016   after lung surgey small clots in lungs,after hip dvt-5/16   Pneumonia    4/18   Positive PPD 1965   "non reactive in 2012" (10/19/2012)   Routine general medical examination at a health care facility    S/P CABG x 1 03/24/2017   LIMA to LAD   S/P mitral valve repair 03/24/2017   Complex valvuloplasty including artificial Gore-tex neochord placement x6 and 28 mm Sorin Annuloflex posterior annuloplasty band   Special screening for malignant neoplasm of prostate    Spinal stenosis, unspecified region other than cervical    Wrist pain, left    Past Surgical History:  Procedure Laterality Date   ANTERIOR CRUCIATE LIGAMENT REPAIR Left 1967   CARDIAC CATHETERIZATION  2000   CHEST TUBE INSERTION Right 10/19/2012   post bronch   COLONOSCOPY W/ POLYPECTOMY     CORONARY ARTERY BYPASS GRAFT N/A 03/24/2017   Procedure: CORONARY ARTERY BYPASS GRAFTING (CABG)x1 using left internal mammary artery, LIMA-LAD;  Surgeon: Purcell Nails, MD;  Location: [facility] OR;  Service: Open Heart Surgery;  Laterality: N/A;   CORONARY PRESSURE/FFR STUDY N/A 08/11/2016   Procedure: Intravascular Pressure Wire/FFR Study;  Surgeon: Peter M Swaziland, MD;  Location[facility] INVASIVE CV LAB;  Service: Cardiovascular;  Laterality: N/A;   FLEXIBLE BRONCHOSCOPY  10/19/2012   Flexible video fiberoptic  bronchoscopy with electromagnetic navigation and biopsies.   IR ANGIOGRAM PULMONARY BILATERAL SELECTIVE  02/06/2018   IR ANGIOGRAM SELECTIVE EACH ADDITIONAL VESSEL  02/06/2018   IR ANGIOGRAM SELECTIVE EACH ADDITIONAL VESSEL  02/06/2018   IR INFUSION THROMBOL ARTERIAL INITIAL (MS)  02/06/2018   IR INFUSION THROMBOL ARTERIAL INITIAL (MS)  02/06/2018   IR THROMB F/U EVAL ART/VEN FINAL DAY (MS)  02/07/2018   IR US GUIDE VASC ACCESS RIGHT  02/06/2018   LOBECTOMY Right 09/02/2016   Procedure: RIGHT UPPER LOBECTOMY;  Surgeon: Loreli Slot, MD;  Location: [facility] OR;  Service: Thoracic;  Laterality: Right;   LYMPH NODE DISSECTION Right 09/02/2016   Procedure: LYMPH NODE DISSECTION, RIGHT LUNG;  Surgeon: Loreli Slot, MD;  Location: [facility]  OR;  Service: Thoracic;  Laterality: Right;   MITRAL VALVE REPAIR N/A 03/24/2017   Procedure: MITRAL VALVE REPAIR (MVR) with Sorin Carbomedics Annuloflex size 28;  Surgeon: Purcell Nails, MD;  Location: [facility] OR;  Service: Open Heart Surgery;  Laterality: N/A;   RIGHT/LEFT HEART CATH AND CORONARY ANGIOGRAPHY N/A 08/11/2016   Procedure: Right/Left Heart Cath and Coronary Angiography;  Surgeon: Peter M Swaziland, MD;  Location: [facility] INVASIVE CV LAB;  Service: Cardiovascular;  Laterality: N/A;   TEE WITHOUT CARDIOVERSION N/A 07/17/2016   Procedure: TRANSESOPHAGEAL ECHOCARDIOGRAM (TEE);  Surgeon: Chrystie Nose, MD;  Location: [facility] ENDOSCOPY;  Service: Cardiovascular;  Laterality: N/A;   TEE WITHOUT CARDIOVERSION N/A 03/24/2017   Procedure: TRANSESOPHAGEAL ECHOCARDIOGRAM (TEE);  Surgeon: Purcell Nails, MD;  Location: [facility] OR;  Service: Open Heart Surgery;  Laterality: N/A;   TONSILLECTOMY  1950's   TOTAL HIP ARTHROPLASTY Left 10/05/2014   dr Despina Hick   TOTAL HIP ARTHROPLASTY Left 10/05/2014   Procedure: LEFT TOTAL HIP ARTHROPLASTY ANTERIOR APPROACH;  Surgeon: Ollen Gross, MD;  Location: [facility] OR;  Service: Orthopedics;  Laterality: Left;   VIDEO ASSISTED THORACOSCOPY (VATS)/WEDGE  RESECTION Right 09/02/2016   Procedure: VIDEO ASSISTED THORACOSCOPY (VATS)/RIGHT UPPER LOBE WEDGE RESECTION;  Surgeon: Loreli Slot, MD;  Location: [facility] OR;  Service: Thoracic;  Laterality: Right;   VIDEO BRONCHOSCOPY WITH ENDOBRONCHIAL NAVIGATION N/A 10/19/2012   Procedure: VIDEO BRONCHOSCOPY WITH ENDOBRONCHIAL NAVIGATION;  Surgeon: Leslye Peer, MD;  Location: [facility] OR;  Service: Thoracic;  Laterality: N/A;   WRIST RECONSTRUCTION Left 12/2009   'proximal row carpectomy" [name]   Family History  Problem Relation Age of Onset   Heart failure Mother    Heart disease Father    Heart attack Father    Heart disease Paternal Uncle    Prostate cancer Neg Hx    Colon cancer Neg Hx    Hypertension Neg Hx    Hyperlipidemia Neg Hx    Diabetes Neg Hx    Social History   Socioeconomic History   Marital status: Married    Spouse name: Ricke Hey   Number of children: 3   Years of education: Not on file   Highest education level: Bachelor's degree (e.g., BA, AB, BS)  Occupational History   Occupation: Retired, Multimedia programmer   Occupation: Retired, Real estate    Comment: Slow  Tobacco Use   Smoking status: Never   Smokeless tobacco: Never  Vaping Use   Vaping status: Never Used  Substance and Sexual Activity   Alcohol use: Yes    Alcohol/week: 1.0 standard drink of alcohol    Types: 1 Cans of beer per week    Comment: time to time   Drug use: No   Sexual activity: Never    Partners: Female  Other Topics Concern   Not on file  Social History Narrative   HSG, [employer]. Married '73.  2 sons - '74,   '76;  1 daughter -  '77  6 grandchildren.Work - Teacher, music now retired. ACP - not fully discussed.       Lives with wife-2025   Social Drivers of Health   Financial Resource Strain: Low Risk  (06/23/2023)   Overall Financial Resource Strain (CARDIA)    Difficulty of Paying Living Expenses: Not hard at all  Food Insecurity: No Food Insecurity  (06/23/2023)   Hunger Vital Sign    Worried About Running Out of Food in the Last Year: Never true    Ran  Out of Food in the Last Year: Never true  Transportation Needs: No Transportation Needs (06/23/2023)   PRAPARE - Administrator, Civil Service (Medical): No    Lack of Transportation (Non-Medical): No  Physical Activity: Insufficiently Active (06/23/2023)   Exercise Vital Sign    Days of Exercise per Week: 2 days    Minutes of Exercise per Session: 30 min  Stress: Stress Concern Present (06/23/2023)   [employer] of Occupational Health - Occupational Stress Questionnaire    Feeling of Stress : To some extent  Social Connections: Unknown (06/23/2023)   Social Connection and Isolation Panel [NHANES]    Frequency of Communication with Friends and Family: Twice a week    Frequency of Social Gatherings with Friends and Family: Twice a week    Attends Religious Services: Patient declined    Database administrator or Organizations: No    Attends Engineer, structural: Not on file    Marital Status: Married  Recent Concern: Social Connections - Socially Isolated (03/27/2023)   Social Connection and Isolation Panel [NHANES]    Frequency of Communication with Friends and Family: Once a week    Frequency of Social Gatherings with Friends and Family: Once a week    Attends Religious Services: Never    Database administrator or Organizations: No    Attends Engineer, structural: Not on file    Marital Status: Married    Tobacco Counseling Counseling given: Not Answered   Clinical Intake:  Pre-visit preparation completed: Yes  Pain : 0-10 Pain Score: 2  Pain Location: Back (both hips) Pain Orientation: Lower Pain Descriptors / Indicators: Aching, Discomfort Pain Onset: More than a month ago (OA (osteoarthritis) of hip) Pain Frequency: Intermittent     BMI - recorded: 35.24 Nutritional Risks: None Diabetes: No  How often do you need to have  someone help you when you read instructions, pamphlets, or other written materials from your doctor or pharmacy?: 1 - Never  Interpreter Needed?: No  Information entered by :: Jaydis Duchene, RMA   Activities of Daily Living    06/23/2023   11:40 AM  In your present state of health, do you have any difficulty performing the following activities:  Hearing? 0  Vision? 1  Difficulty concentrating or making decisions? 0  Walking or climbing stairs? 1  Dressing or bathing? 0  Doing errands, shopping? 0  Preparing Food and eating ? N  Using the Toilet? N  In the past six months, have you accidently leaked urine? N  Do you have problems with loss of bowel control? N  Managing your Medications? N  Managing your Finances? N  Housekeeping or managing your Housekeeping? N    Patient Care Team: Etta Grandchild, MD as PCP - General (Internal Medicine) Swazil[place], Peter M, MD as PCP - Cardiology (Cardiology) Syliva Overman, RN as Oncology Nurse Navigator  Indicate any recent Medical Services you may have received from other than [facility] providers in the past year (date may be approximate).     Assessment:   This is a routine wellness examination for [place].  Hearing/Vision screen Hearing Screening - Comments:: Noticing some hearing Vision Screening - Comments:: Wears eyeglasses   Goals Addressed               This Visit's Progress     Patient Stated (pt-stated)        Would get more active/ get back to feeling better.  Depression Screen    06/30/2023   11:37 AM 03/18/2022    3:00 PM 12/26/2019   11:02 AM 12/13/2018    9:57 AM 08/20/2017    3:22 PM 07/30/2017   11:44 AM 05/24/2017    3:33 PM  PHQ 2/9 Scores  PHQ - 2 Score 3 1 0 2 2 0 1  PHQ- 9 Score 6 1  4 11       Fall Risk    06/23/2023   11:40 AM 03/31/2023    9:53 AM 03/18/2022    2:59 PM 03/06/2021    2:01 PM 12/26/2019   11:01 AM  Fall Risk   Falls in the past year? 0 0 0 0 0  Number falls in past yr: 0 0 0  0   Injury with Fall? 0 0 0  0  Risk for fall due to :  No Fall Risks No Fall Risks  No Fall Risks  Follow up Falls evaluation completed;Falls prevention discussed Falls evaluation completed Falls evaluation completed  Falls evaluation completed    MEDICARE RISK AT HOME: Medicare Risk at Home Any stairs in or around the home?: (Patient-Rptd) Yes If so, are there any without handrails?: (Patient-Rptd) No Home free of loose throw rugs in walkways, pet beds, electrical cords, etc?: (Patient-Rptd) Yes Adequate lighting in your home to reduce risk of falls?: (Patient-Rptd) Yes Use of a cane, walker or w/c?: (Patient-Rptd) Yes Grab bars in the bathroom?: (Patient-Rptd) Yes Shower chair or bench in shower?: (Patient-Rptd) Yes Elevated toilet seat or a handicapped toilet?: (Patient-Rptd) No  TIMED UP AND GO:  Was the test performed?  No    Cognitive Function:        06/30/2023   11:34 AM  6CIT Screen  What Year? 0 points  What month? 0 points  What time? 0 points  Count back from 20 0 points  Months in reverse 0 points  Repeat phrase 0 points  Total Score 0 points    Immunizations Immunization History  Administered Date(s) Administered   Fluad Quad(high Dose 65+) 03/10/2019, 03/27/2020, 03/06/2021, 03/18/2022   Fluad Trivalent(High Dose 65+) 03/31/2023   Influenza, High Dose Seasonal PF 02/28/2018   Influenza,inj,Quad PF,6+ Mos 07/02/2014   Influenza-Unspecified 02/17/2017   PFIZER Comirnaty(Gray Top)Covid-19 Tri-Sucrose Vaccine 11/05/2020   PFIZER(Purple Top)SARS-COV-2 Vaccination 06/15/2019, 07/10/2019, 02/20/2020   Pfizer(Comirnaty)Fall Seasonal Vaccine 12 years and older 02/27/2022, 05/15/2023   Pneumococcal Conjugate-13 07/02/2014   Pneumococcal Polysaccharide-23 07/24/2015, 03/18/2022   Td 07/10/2008   Tdap 12/13/2018   Zoster Recombinant(Shingrix) 11/04/2017    TDAP status: Up to date  Flu Vaccine status: Up to date  Pneumococcal vaccine status: Up to  date  Covid-19 vaccine status: Completed vaccines  Qualifies for Shingles Vaccine? Yes   Zostavax completed  Declines   Shingrix Completed?: No.    Education has been provided regarding the importance of this vaccine. Patient has been advised to call insurance company to determine out of pocket expense if they have not yet received this vaccine. Advised may also receive vaccine at local pharmacy or Health Dept. Verbalized acceptance and understanding.  Screening Tests Health Maintenance  Topic Date Due   Colonoscopy  04/12/2023   Zoster Vaccines- Shingrix (2 of 2) 07/01/2023 (Originally 12/30/2017)   COVID-19 Vaccine (7 - 2024-25 season) 07/10/2023   OPHTHALMOLOGY EXAM  07/14/2023   HEMOGLOBIN A1C  09/08/2023   Diabetic kidney evaluation - eGFR measurement  03/22/2024   Diabetic kidney evaluation - Urine ACR  03/30/2024   FOOT EXAM  03/30/2024   Medicare Annual Wellness (AWV)  06/29/2024   DTaP/Tdap/Td (3 - Td or Tdap) 12/12/2028   Pneumonia Vaccine 57+ Years old  Completed   INFLUENZA VACCINE  Completed   Hepatitis C Screening  Completed   HPV VACCINES  Aged Out    Health Maintenance  Health Maintenance Due  Topic Date Due   Colonoscopy  04/12/2023    Colorectal cancer screening: Referral to GI placed 03/31/2023. Pt aware the office will call re: appt.  Lung Cancer Screening: (Low Dose CT Chest recommended if Age 15-80 years, 20 pack-year currently smoking OR have quit w/in 15years.) does not qualify.   Lung Cancer Screening Referral: N/A  Additional Screening:  Hepatitis C Screening: does qualify; Completed 07/24/2015  Vision Screening: Recommended annual ophthalmology exams for early detection of glaucoma and other disorders of the eye. Is the patient up to date with their annual eye exam?  Yes  Who is the provider or what is the name of the office in which the patient attends annual eye exams? Dr. Dione Booze If pt is not established with a provider, would they like to be  referred to a provider to establish care? No .   Dental Screening: Recommended annual dental exams for proper oral hygiene   Community Resource Referral / Chronic Care Management: CRR required this visit?  No   CCM required this visit?  No     Plan:     I have personally reviewed and noted the following in the patient's chart:   Medical and social history Use of alcohol, tobacco or illicit drugs  Current medications and supplements including opioid prescriptions. Patient is not currently taking opioid prescriptions. Functional ability and status Nutritional status Physical activity Advanced directives List of other physicians Hospitalizations, surgeries, and ER visits in previous 12 months Vitals Screenings to include cognitive, depression, and falls Referrals and appointments  In addition, I have reviewed and discussed with patient certain preventive protocols, quality metrics, and best practice recommendations. A written personalized care plan for preventive services as well as general preventive health recommendations were provided to patient.     Jemiah Cuadra L Priscille Shadduck, CMA   06/30/2023   After Visit Summary: (MyChart) Due to this being a telephonic visit, the after visit summary with patients personalized plan was offered to patient via MyChart   Nurse Notes: Patient is due for a colonoscopy, however, he is asking does he still need one due to him seeing Dr. Arbutus Ped every 4 months.  Dr. Yetta Barre has placed a referral for patient to have a colonoscopy on 03/31/2023. I have sent a telephone encounter for Dr. Yetta Barre to follow up on this question.  Patient had no other concerns to address today.  Medical screening examination/treatment/procedure(s) were performed by non-physician practitioner and as supervising physician I was immediately available for consultation/collaboration.  I agree with above. Jacinta Shoe, MD

## 2023-06-30 NOTE — Patient Instructions (Signed)
James [name] , Thank you for taking time to come for your Medicare Wellness Visit. I appreciate your ongoing commitment to your health goals. Please review the following plan we discussed and let me know if I can assist you in the future.   Referrals/Orders/Follow-Ups/Clinician Recommendations: It was nice talking to you today.  Please check your Mychart in regards to getting a colonoscopy this year.  Keep up the good work.  This is a list of the screening recommended for you and due dates:  Health Maintenance  Topic Date Due   Colon Cancer Screening  04/12/2023   Zoster (Shingles) Vaccine (2 of 2) 07/01/2023*   COVID-19 Vaccine (7 - 2024-25 season) 07/10/2023   Eye exam for diabetics  07/14/2023   Hemoglobin A1C  09/08/2023   Yearly kidney function blood test for diabetes  03/22/2024   Yearly kidney health urinalysis for diabetes  03/30/2024   Complete foot exam   03/30/2024   Medicare Annual Wellness Visit  06/29/2024   DTaP/Tdap/Td vaccine (3 - Td or Tdap) 12/12/2028   Pneumonia Vaccine  Completed   Flu Shot  Completed   Hepatitis C Screening  Completed   HPV Vaccine  Aged Out  *Topic was postponed. The date shown is not the original due date.    Advanced directives: (In Chart) A copy of your advanced directives are scanned into your chart should your provider ever need it.  Next Medicare Annual Wellness Visit scheduled for next year: Yes

## 2023-07-06 DIAGNOSIS — G4733 Obstructive sleep apnea (adult) (pediatric): Secondary | ICD-10-CM | POA: Diagnosis not present

## 2023-07-07 ENCOUNTER — Other Ambulatory Visit: Payer: Self-pay | Admitting: *Deleted

## 2023-07-07 DIAGNOSIS — C3491 Malignant neoplasm of unspecified part of right bronchus or lung: Secondary | ICD-10-CM

## 2023-07-08 ENCOUNTER — Telehealth: Payer: Self-pay | Admitting: Internal Medicine

## 2023-07-08 ENCOUNTER — Inpatient Hospital Stay (HOSPITAL_BASED_OUTPATIENT_CLINIC_OR_DEPARTMENT_OTHER): Payer: Medicare PPO | Admitting: Internal Medicine

## 2023-07-08 ENCOUNTER — Inpatient Hospital Stay: Payer: Medicare PPO | Attending: Internal Medicine

## 2023-07-08 VITALS — BP 124/74 | HR 78 | Temp 97.6°F | Resp 18 | Ht 67.0 in | Wt 226.9 lb

## 2023-07-08 DIAGNOSIS — K5900 Constipation, unspecified: Secondary | ICD-10-CM | POA: Diagnosis not present

## 2023-07-08 DIAGNOSIS — Z79899 Other long term (current) drug therapy: Secondary | ICD-10-CM | POA: Insufficient documentation

## 2023-07-08 DIAGNOSIS — D696 Thrombocytopenia, unspecified: Secondary | ICD-10-CM | POA: Diagnosis not present

## 2023-07-08 DIAGNOSIS — C3431 Malignant neoplasm of lower lobe, right bronchus or lung: Secondary | ICD-10-CM | POA: Insufficient documentation

## 2023-07-08 DIAGNOSIS — C3491 Malignant neoplasm of unspecified part of right bronchus or lung: Secondary | ICD-10-CM

## 2023-07-08 DIAGNOSIS — C3490 Malignant neoplasm of unspecified part of unspecified bronchus or lung: Secondary | ICD-10-CM | POA: Diagnosis not present

## 2023-07-08 LAB — CBC WITH DIFFERENTIAL (CANCER CENTER ONLY)
Abs Immature Granulocytes: 0.03 10*3/uL (ref 0.00–0.07)
Basophils Absolute: 0 10*3/uL (ref 0.0–0.1)
Basophils Relative: 1 %
Eosinophils Absolute: 0.2 10*3/uL (ref 0.0–0.5)
Eosinophils Relative: 2 %
HCT: 47.9 % (ref 39.0–52.0)
Hemoglobin: 15.9 g/dL (ref 13.0–17.0)
Immature Granulocytes: 1 %
Lymphocytes Relative: 25 %
Lymphs Abs: 1.6 10*3/uL (ref 0.7–4.0)
MCH: 31.1 pg (ref 26.0–34.0)
MCHC: 33.2 g/dL (ref 30.0–36.0)
MCV: 93.6 fL (ref 80.0–100.0)
Monocytes Absolute: 0.7 10*3/uL (ref 0.1–1.0)
Monocytes Relative: 10 %
Neutro Abs: 4.1 10*3/uL (ref 1.7–7.7)
Neutrophils Relative %: 61 %
Platelet Count: 142 10*3/uL — ABNORMAL LOW (ref 150–400)
RBC: 5.12 MIL/uL (ref 4.22–5.81)
RDW: 14.9 % (ref 11.5–15.5)
WBC Count: 6.6 10*3/uL (ref 4.0–10.5)
nRBC: 0 % (ref 0.0–0.2)

## 2023-07-08 LAB — CMP (CANCER CENTER ONLY)
ALT: 23 U/L (ref 0–44)
AST: 24 U/L (ref 15–41)
Albumin: 4.1 g/dL (ref 3.5–5.0)
Alkaline Phosphatase: 60 U/L (ref 38–126)
Anion gap: 4 — ABNORMAL LOW (ref 5–15)
BUN: 18 mg/dL (ref 8–23)
CO2: 29 mmol/L (ref 22–32)
Calcium: 9.3 mg/dL (ref 8.9–10.3)
Chloride: 108 mmol/L (ref 98–111)
Creatinine: 1.42 mg/dL — ABNORMAL HIGH (ref 0.61–1.24)
GFR, Estimated: 52 mL/min — ABNORMAL LOW (ref >60)
Glucose, Bld: 111 mg/dL — ABNORMAL HIGH (ref 70–99)
Potassium: 4.3 mmol/L (ref 3.5–5.1)
Sodium: 141 mmol/L (ref 135–145)
Total Bilirubin: 0.5 mg/dL (ref 0.0–1.2)
Total Protein: 6.8 g/dL (ref 6.5–8.1)

## 2023-07-08 NOTE — Progress Notes (Signed)
 [facility] [phone]   [phone]  OFFICE PROGRESS NOTE  Etta Grandchild, MD [address] [place] [address]  DIAGNOSIS: Recurrent non-small cell lung cancer, adenocarcinoma initially diagnosed as stage IA (T1c, N0, M0) non-small cell lung cancer, adenocarcinoma presented with right upper lobe lung nodule in April 2018 with recurrence in June 2019.  Biomarker Findings Microsatellite status - MS-Stable Tumor Mutational Burden - TMB-Low (3 Muts/Mb) Genomic Findings For a complete list of the genes assayed, please refer to the Appendix. EGFR exon 19 deletion ([id number]) CDKN2A/B loss NKX2-1 amplification 7 Disease relevant genes with no reportable alterations: KRAS, ALK, BRAF, MET, RET, ERBB2, ROS1   PRIOR THERAPY:  1) Status post right upper lobectomy with lymph node dissection under the care of Dr. Dorris Fetch on 09/02/2016. 2) status post palliative radiotherapy to enlarging right upper lobe lung nodule under the care of Dr. Mitzi Hansen completed on Oct 07, 2021.  CURRENT THERAPY: Tagrisso 80 mg p.o. daily.  First dose started November 05, 2017.  Status post 68  months of treatment.  INTERVAL HISTORY: James [name] 73 y.o. male returns to the clinic today for his 3 months follow-up visit accompanied by his wife. Discussed the use of AI scribe software for clinical note transcription with the patient, who gave verbal consent to proceed.  History of Present Illness   James [name] "Rosanne Ashing" is a 73 year old male with recurrent non-small cell lung cancer who presents for follow-up. He is accompanied by his wife.  He has a history of recurrent non-small cell lung cancer, adenocarcinoma with a positive EGFR mutation, initially diagnosed in April 2018 as stage 1A. In June 2019, he experienced a recurrence of the disease. He has been on Tagrisso 80 mg once daily for 68 months.  He experiences digestive issues, specifically constipation, which is  a change from previous episodes of diarrhea. He also has a persistent cough, particularly in the mornings, accompanied by phlegm. His wife notes that he seems to cough more frequently and gets choked for no apparent reason.  He describes episodes of difficulty swallowing, where pills or solid foods like chicken get stuck, leading to coughing fits. He has not previously considered this a significant issue and has not consulted a gastroenterologist.  He has a history of low platelet counts, which he attributes to his ongoing treatment with Tagrisso.  He has not had a colonoscopy in over ten years, despite regular CT scans.       MEDICAL HISTORY: Past Medical History:  Diagnosis Date   Adenocarcinoma of right lung, stage 1 (HCC) 09/06/2016   Anxiety    Arthritis    "knees, hips, back" (10/19/2012)   Chronic diastolic congestive heart failure (HCC)    Chronic lower back pain    Colon polyps    10/27/2002, repeat letter 09/17/2007   Coronary artery disease    Coronary artery disease involving native coronary artery of native heart without angina pectoris    Depressive disorder, not elsewhere classified    no meds   Diabetes mellitus without complication (HCC)    diet controlled- no med  (while in hosp 4/18 -elevated cbg   Dyspnea    Fasting hyperglycemia    GERD (gastroesophageal reflux disease)    Heart murmur    Hemoptysis    abnormal CT Chest 01/29/10 - ? new GG changes RUL > not viz on plain cxr 02/26/2010   Hypertension    Mitral regurgitation  severe MR 08/2016   MPN (myeloproliferative neoplasm) (HCC)    1st detected 06/04/1998   Obesity    OSA on CPAP    last sleep study 10 years ago   Other and unspecified hyperlipidemia    Peripheral vascular disease (HCC) 08/2016   after lung surgey small clots in lungs,after hip dvt-5/16   Pneumonia    4/18   Positive PPD 1965   "non reactive in 2012" (10/19/2012)   Routine general medical examination at a health care facility    S/P  CABG x 1 03/24/2017   LIMA to LAD   S/P mitral valve repair 03/24/2017   Complex valvuloplasty including artificial Gore-tex neochord placement x6 and 28 mm Sorin Annuloflex posterior annuloplasty band   Special screening for malignant neoplasm of prostate    Spinal stenosis, unspecified region other than cervical    Wrist pain, left     ALLERGIES:  is allergic to symbicort [budesonide-formoterol fumarate] and amoxicillin.  MEDICATIONS:  Current Outpatient Medications  Medication Sig Dispense Refill   clindamycin (CLEOCIN T) 1 % external solution APPLY TOPICALLY TWICE A DAY 60 mL 0   Evolocumab (REPATHA SURECLICK) 140 MG/ML SOAJ Inject 140 mg into the skin every 14 (fourteen) days. 6 mL 3   ezetimibe (ZETIA) 10 MG tablet TAKE 1 TABLET BY MOUTH EVERY DAY 90 tablet 3   furosemide (LASIX) 20 MG tablet Take 1 tablet (20 mg total) by mouth daily as needed for edema. (Patient taking differently: Take 20 mg by mouth 3 (three) times a week.) 90 tablet 2   JARDIANCE 10 MG TABS tablet TAKE 1 TABLET BY MOUTH DAILY BEFORE BREAKFAST. 90 tablet 0   methocarbamol (ROBAXIN) 500 MG tablet Take 1 tablet (500 mg total) by mouth every 8 (eight) hours as needed for muscle spasms. 270 tablet 1   metoprolol tartrate (LOPRESSOR) 25 MG tablet TAKE 1/2 TABLET TWICE A DAY BY MOUTH 90 tablet 3   Multiple Vitamin (MULTIVITAMIN) tablet Take 1 tablet by mouth daily.     Omega-3 Fatty Acids (FISH OIL) 500 MG CAPS Take 500 mg by mouth daily.      osimertinib mesylate (TAGRISSO) 80 MG tablet TAKE 1 TABLET (80 MG TOTAL) BY MOUTH DAILY. 30 tablet 2   rivaroxaban (XARELTO) 20 MG TABS tablet Take 1 tablet (20 mg total) by mouth daily with supper. 90 tablet 3   sildenafil (REVATIO) 20 MG tablet Take 4 tablets (80 mg total) by mouth daily as needed. 60 tablet 5   No current facility-administered medications for this visit.    SURGICAL HISTORY:  Past Surgical History:  Procedure Laterality Date   ANTERIOR CRUCIATE LIGAMENT  REPAIR Left 1967   CARDIAC CATHETERIZATION  2000   CHEST TUBE INSERTION Right 10/19/2012   post bronch   COLONOSCOPY W/ POLYPECTOMY     CORONARY ARTERY BYPASS GRAFT N/A 03/24/2017   Procedure: CORONARY ARTERY BYPASS GRAFTING (CABG)x1 using left internal mammary artery, LIMA-LAD;  Surgeon: Purcell Nails, MD;  Location: [facility] OR;  Service: Open Heart Surgery;  Laterality: N/A;   CORONARY PRESSURE/FFR STUDY N/A 08/11/2016   Procedure: Intravascular Pressure Wire/FFR Study;  Surgeon: Peter M Swaziland, MD;  Location: [facility] INVASIVE CV LAB;  Service: Cardiovascular;  Laterality: N/A;   FLEXIBLE BRONCHOSCOPY  10/19/2012   Flexible video fiberoptic bronchoscopy with electromagnetic navigation and biopsies.   IR ANGIOGRAM PULMONARY BILATERAL SELECTIVE  02/06/2018   IR ANGIOGRAM SELECTIVE EACH ADDITIONAL VESSEL  02/06/2018   IR ANGIOGRAM SELECTIVE EACH ADDITIONAL VESSEL  02/06/2018   IR INFUSION THROMBOL ARTERIAL INITIAL (MS)  02/06/2018   IR INFUSION THROMBOL ARTERIAL INITIAL (MS)  02/06/2018   IR THROMB F/U EVAL ART/VEN FINAL DAY (MS)  02/07/2018   IR US GUIDE VASC ACCESS RIGHT  02/06/2018   LOBECTOMY Right 09/02/2016   Procedure: RIGHT UPPER LOBECTOMY;  Surgeon: Loreli Slot, MD;  Location: [facility] OR;  Service: Thoracic;  Laterality: Right;   LYMPH NODE DISSECTION Right 09/02/2016   Procedure: LYMPH NODE DISSECTION, RIGHT LUNG;  Surgeon: Loreli Slot, MD;  Location: [facility] OR;  Service: Thoracic;  Laterality: Right;   MITRAL VALVE REPAIR N/A 03/24/2017   Procedure: MITRAL VALVE REPAIR (MVR) with Sorin Carbomedics Annuloflex size 28;  Surgeon: Purcell Nails, MD;  Location: [facility] OR;  Service: Open Heart Surgery;  Laterality: N/A;   RIGHT/LEFT HEART CATH AND CORONARY ANGIOGRAPHY N/A 08/11/2016   Procedure: Right/Left Heart Cath and Coronary Angiography;  Surgeon: Peter M Swaziland, MD;  Location: [facility] INVASIVE CV LAB;  Service: Cardiovascular;  Laterality: N/A;   TEE WITHOUT CARDIOVERSION N/A 07/17/2016   Procedure:  TRANSESOPHAGEAL ECHOCARDIOGRAM (TEE);  Surgeon: Chrystie Nose, MD;  Location: [facility] ENDOSCOPY;  Service: Cardiovascular;  Laterality: N/A;   TEE WITHOUT CARDIOVERSION N/A 03/24/2017   Procedure: TRANSESOPHAGEAL ECHOCARDIOGRAM (TEE);  Surgeon: Purcell Nails, MD;  Location: [facility] OR;  Service: Open Heart Surgery;  Laterality: N/A;   TONSILLECTOMY  1950's   TOTAL HIP ARTHROPLASTY Left 10/05/2014   dr Despina Hick   TOTAL HIP ARTHROPLASTY Left 10/05/2014   Procedure: LEFT TOTAL HIP ARTHROPLASTY ANTERIOR APPROACH;  Surgeon: Ollen Gross, MD;  Location: [facility] OR;  Service: Orthopedics;  Laterality: Left;   VIDEO ASSISTED THORACOSCOPY (VATS)/WEDGE RESECTION Right 09/02/2016   Procedure: VIDEO ASSISTED THORACOSCOPY (VATS)/RIGHT UPPER LOBE WEDGE RESECTION;  Surgeon: Loreli Slot, MD;  Location: [facility] OR;  Service: Thoracic;  Laterality: Right;   VIDEO BRONCHOSCOPY WITH ENDOBRONCHIAL NAVIGATION N/A 10/19/2012   Procedure: VIDEO BRONCHOSCOPY WITH ENDOBRONCHIAL NAVIGATION;  Surgeon: Leslye Peer, MD;  Location: [facility] OR;  Service: Thoracic;  Laterality: N/A;   WRIST RECONSTRUCTION Left 12/2009   'proximal row carpectomy" [name]    REVIEW OF SYSTEMS:  Constitutional: negative Eyes: negative Ears, nose, mouth, throat, and face: negative Respiratory: negative Cardiovascular: negative Gastrointestinal: positive for dysphagia Genitourinary:negative Integument/breast: negative Hematologic/lymphatic: negative Musculoskeletal:positive for back pain Neurological: negative Behavioral/Psych: negative Endocrine: negative Allergic/Immunologic: negative   PHYSICAL EXAMINATION: General appearance: alert, cooperative, and no distress Head: Normocephalic, without obvious abnormality, atraumatic Neck: no adenopathy, no JVD, supple, symmetrical, trachea midline, and thyroid not enlarged, symmetric, no tenderness/mass/nodules Lymph nodes: Cervical, supraclavicular, and axillary nodes normal. Resp: clear to auscultation  bilaterally Back: symmetric, no curvature. ROM normal. No CVA tenderness. Cardio: regular rate and rhythm, S1, S2 normal, no murmur, click, rub or gallop GI: soft, non-tender; bowel sounds normal; no masses,  no organomegaly Extremities: extremities normal, atraumatic, no cyanosis or edema Neurologic: Alert and oriented X 3, normal strength and tone. Normal symmetric reflexes. Normal coordination and gait  ECOG PERFORMANCE STATUS: 1 - Symptomatic but completely ambulatory  Blood pressure 124/74, pulse 78, temperature 97.6 F (36.4 C), temperature source Temporal, resp. rate 18, height 5\' 7"  (1.702 m), weight 226 lb 14.4 oz (102.9 kg), SpO2 98%.  LABORATORY DATA: Lab Results  Component Value Date   WBC 6.1 03/23/2023   HGB 16.3 03/23/2023   HCT 47.3 03/23/2023   MCV 92.2 03/23/2023   PLT 146 (L) 03/23/2023      Chemistry  Component Value Date/Time   NA 140 03/23/2023 0914   NA 142 10/01/2016 1534   K 4.2 03/23/2023 0914   K 4.2 10/01/2016 1534   CL 109 03/23/2023 0914   CO2 26 03/23/2023 0914   CO2 28 10/01/2016 1534   BUN 23 03/23/2023 0914   BUN 15.9 10/01/2016 1534   CREATININE 1.37 (H) 03/23/2023 0914   CREATININE 1.10 02/08/2017 1101   CREATININE 1.5 (H) 10/01/2016 1534      Component Value Date/Time   CALCIUM 9.1 03/23/2023 0914   CALCIUM 9.6 10/01/2016 1534   ALKPHOS 59 03/23/2023 0914   ALKPHOS 65 10/01/2016 1534   AST 24 03/23/2023 0914   AST 20 10/01/2016 1534   ALT 25 03/23/2023 0914   ALT 24 10/01/2016 1534   BILITOT 0.6 03/23/2023 0914   BILITOT 0.46 10/01/2016 1534       RADIOGRAPHIC STUDIES: No results found.   ASSESSMENT AND PLAN: This is a very pleasant 73 years old white male with a stage Ia non-small cell lung cancer status post right upper lobectomy with lymph node dissection on September 02, 2016. The patient he was on observation since that time. Unfortunately there are some concerning findings with nodular density in the posterior right  lower lobe as well as increase and right pleural effusion suspicious for disease recurrence. He underwent ultrasound-guided right thoracentesis and the cytology of the pleural fluid was consistent with recurrent adenocarcinoma. He had molecular studies performed by [facility] 1 that showed positive EGFR mutation with deletion 19. The patient was started on treatment with Tagrisso 80 mg p.o. daily on 11/05/2017.  He status post 68 months of treatment. He recently underwent SBRT to the enlarging right lung nodule under the care of Dr. Mitzi Hansen. The patient has been tolerating his treatment with Tagrisso fairly well.    Recurrent Non-Small Cell Lung Cancer, Adenocarcinoma with Positive EGFR Mutation Hagan Vanauken, a 73 year old male, has recurrent non-small cell lung cancer, adenocarcinoma with a positive EGFR mutation (Exon 19). Initially diagnosed in April 2018 as stage 1A, with recurrence in June 2019. He has been on Tagrisso (osimertinib) 80 mg once daily for 68 months with well-managed disease. Current symptoms include mild cough with phlegm and occasional choking on solid foods, suggesting possible esophageal involvement or acid reflux. Discussed continuing Tagrisso due to its efficacy and potential side effects, including thrombocytopenia and gastrointestinal issues. Recommended gastroenterologist evaluation for dysphagia and choking episodes to rule out esophageal stricture or other causes. - Continue Tagrisso 80 mg once daily - Order lab tests and scan one week before the next visit - Refer to gastroenterologist for evaluation of dysphagia and choking episodes  Thrombocytopenia Mild thrombocytopenia with platelet count at 142,000, likely secondary to Tagrisso therapy. Discussed the need for regular monitoring. - Monitor platelet count regularly  Constipation Intermittent constipation, currently predominant, alternating with episodes of diarrhea. Discussed dietary modifications and use of  over-the-counter laxatives as needed. - Implement dietary modifications - Consider over-the-counter laxatives if needed  General Health Maintenance Has not had a colonoscopy in over ten years. Discussed the importance of colorectal cancer screening. Recommended scheduling a colonoscopy and discussing the need for both upper and lower endoscopy with the gastroenterologist. - Schedule colonoscopy - Discuss with gastroenterologist the need for both upper and lower endoscopy  Follow-up - Schedule follow-up visit with lab tests and scan one week before the appointment.   The patient was advised to call immediately if he has any concerning symptoms in the interval.  The patient  voices understanding of current disease status and treatment options and is in agreement with the current care plan. All questions were answered. The patient knows to call the clinic with any problems, questions or concerns. We can certainly see the patient much sooner if necessary. The total time spent in the appointment was 30 minutes.  Disclaimer: This note was dictated with voice recognition software. Similar sounding words can inadvertently be transcribed and may not be corrected upon review.

## 2023-07-14 NOTE — Addendum Note (Signed)
 Addended by: Wyvonne Lenz on: 07/14/2023 01:36 PM   Modules accepted: Orders

## 2023-07-15 ENCOUNTER — Other Ambulatory Visit: Payer: Self-pay

## 2023-07-15 NOTE — Progress Notes (Signed)
 Specialty Pharmacy Refill Coordination Note  James [name] is a 73 y.o. male contacted today regarding refills of specialty medication(s) Osimertinib Mesylate [name])   Patient requested (Patient-Rptd) Pickup at [facility] date: (Patient-Rptd) 07/30/23   Medication will be filled on 03.21.25.

## 2023-07-20 DIAGNOSIS — H02884 Meibomian gland dysfunction left upper eyelid: Secondary | ICD-10-CM | POA: Diagnosis not present

## 2023-07-20 DIAGNOSIS — H02881 Meibomian gland dysfunction right upper eyelid: Secondary | ICD-10-CM | POA: Diagnosis not present

## 2023-07-20 DIAGNOSIS — E119 Type 2 diabetes mellitus without complications: Secondary | ICD-10-CM | POA: Diagnosis not present

## 2023-07-20 DIAGNOSIS — H2513 Age-related nuclear cataract, bilateral: Secondary | ICD-10-CM | POA: Diagnosis not present

## 2023-07-20 DIAGNOSIS — H43812 Vitreous degeneration, left eye: Secondary | ICD-10-CM | POA: Diagnosis not present

## 2023-07-20 LAB — HM DIABETES EYE EXAM

## 2023-08-20 NOTE — Progress Notes (Signed)
 d   Cardiology Office Note    Date:  08/25/2023   ID:  James [name], DOB Nov 04, 1950, MRN [id number]  PCP:  Arcadio Knuckles, MD  Cardiologist:  Dr. [place]   Chief Complaint  Patient presents with   Shortness of Breath    History of Present Illness:  James [name] is a 73 y.o. male with PMH of DM II, HLD, and severe MR. he was originally noted to have a loud murmur by his primary care physician.  Initial echocardiogram suggested mild MR, however it also suggested partially flail leaflet.  MR was thought to be underestimated.  TEE showed a flail P3 segment with ruptured cord and a severe MR.  Cardiac catheterization performed on 08/11/2016 showed 65% mid to distal LAD lesion, FFR borderline at 0.81, EF 65%.  Normal cardiac output and RV/LV filling pressure.  He had a PET scan on 08/17/2016 that showed a hypermetabolic solid 2.6 cm anterior right upper lobe pulmonary nodule compatible with prior bronchogenic, no hypermetabolic thoracic lymphadenopathy or distal metastatic disease.  There were nonspecific and mild asymmetric hypermetabolism in the left pontine tonsil and the left tongue base region.  CT surgery recommended lobectomy for his lung mass with possible CABG with LIMA to LAD and MV repair.  He underwent right upper lobe lobectomy. Pathology positive for adenocarcinoma stage Ia.  Serial CT scan was recommended by oncology.  Post procedure, he became very short of breath.  CTA was positive for a small PE, he was started on Xarelto.  Repeat echocardiogram obtained on 02/15/2017 showed EF 60-65%, persistent MR.  He eventually underwent mitral valve repair with Sorin Carbomedics Annuloglex size 28, LIMA to LAD by Dr. Alva Jewels. He was DC on coumadin instead of Xarelto. Completed 6 months of anticoagulation for provoked PE and then it was stopped.   In August 20-19 CT showed a right pleural effusion. Thoracentesis was positive for recurrent CA. He was started on Tagrisso by Dr. Ali Ink. He was admitted on  February 06, 2018 with submassive PE. He was treated with  EKOS with bilateral intra-arterial TPA, patient completed 6 days of IV heparin. The original plan was to transition to Xarelto after 5 days of IV heparin.  However he developed left flank pain, hematuria and right leg painful swelling.  Hence IV heparin was continued.  Both Urology and OIrthopedics evaluated and cleared for transitioning to Xarelto.  Xarelto was started on 02/12/2018.  Hematuria decreased  Right leg pain also improved. MRI did show evidence of hematoma that was improving. He had chronic DVT in right peroneal vein.  Since patient was started on Xarelto, aspirin discontinued to minimize bleeding risk.   In April 2023 he had a new nodule noted on Chest CT and had RT for this area.  Sees Dr Lydia Sams now for CKD. He was started on Jardiance and initially noted significant improvement in edema. He notes he is taking lasix only 2-4 times a month. Weight is up 4 lbs. Activity limited by back pain. Legs swell occasionally. Notes more SOB an fatigue. Did have a lot of wheezing associated with URI a few weeks ago.     Past Medical History:  Diagnosis Date   Adenocarcinoma of right lung, stage 1 (HCC) 09/06/2016   Anxiety    Arthritis    "knees, hips, back" (10/19/2012)   Chronic diastolic congestive heart failure (HCC)    Chronic lower back pain    Colon polyps    10/27/2002, repeat letter 09/17/2007  Coronary artery disease    Coronary artery disease involving native coronary artery of native heart without angina pectoris    Depressive disorder, not elsewhere classified    no meds   Diabetes mellitus without complication (HCC)    diet controlled- no med  (while in hosp 4/18 -elevated cbg   Dyspnea    Fasting hyperglycemia    GERD (gastroesophageal reflux disease)    Heart murmur    Hemoptysis    abnormal CT Chest 01/29/10 - ? new GG changes RUL > not viz on plain cxr 02/26/2010   Hypertension    Mitral regurgitation    severe MR  08/2016   MPN (myeloproliferative neoplasm) (HCC)    1st detected 06/04/1998   Obesity    OSA on CPAP    last sleep study 10 years ago   Other and unspecified hyperlipidemia    Peripheral vascular disease (HCC) 08/2016   after lung surgey small clots in lungs,after hip dvt-5/16   Pneumonia    4/18   Positive PPD 1965   "non reactive in 2012" (10/19/2012)   Routine general medical examination at a health care facility    S/P CABG x 1 03/24/2017   LIMA to LAD   S/P mitral valve repair 03/24/2017   Complex valvuloplasty including artificial Gore-tex neochord placement x6 and 28 mm Sorin Annuloflex posterior annuloplasty band   Special screening for malignant neoplasm of prostate    Spinal stenosis, unspecified region other than cervical    Wrist pain, left     Past Surgical History:  Procedure Laterality Date   ANTERIOR CRUCIATE LIGAMENT REPAIR Left 1967   CARDIAC CATHETERIZATION  2000   CHEST TUBE INSERTION Right 10/19/2012   post bronch   COLONOSCOPY W/ POLYPECTOMY     CORONARY ARTERY BYPASS GRAFT N/A 03/24/2017   Procedure: CORONARY ARTERY BYPASS GRAFTING (CABG)x1 using left internal mammary artery, LIMA-LAD;  Surgeon: Gardenia Jump, MD;  Location: [facility] OR;  Service: Open Heart Surgery;  Laterality: N/A;   CORONARY PRESSURE/FFR STUDY N/A 08/11/2016   Procedure: Intravascular Pressure Wire/FFR Study;  Surgeon: Browning Southwood M Swaziland, MD;  Location: [facility] INVASIVE CV LAB;  Service: Cardiovascular;  Laterality: N/A;   FLEXIBLE BRONCHOSCOPY  10/19/2012   Flexible video fiberoptic bronchoscopy with electromagnetic navigation and biopsies.   IR ANGIOGRAM PULMONARY BILATERAL SELECTIVE  02/06/2018   IR ANGIOGRAM SELECTIVE EACH ADDITIONAL VESSEL  02/06/2018   IR ANGIOGRAM SELECTIVE EACH ADDITIONAL VESSEL  02/06/2018   IR INFUSION THROMBOL ARTERIAL INITIAL (MS)  02/06/2018   IR INFUSION THROMBOL ARTERIAL INITIAL (MS)  02/06/2018   IR THROMB F/U EVAL ART/VEN FINAL DAY (MS)  02/07/2018   IR US  GUIDE VASC  ACCESS RIGHT  02/06/2018   LOBECTOMY Right 09/02/2016   Procedure: RIGHT UPPER LOBECTOMY;  Surgeon: Zelphia Higashi, MD;  Location: [facility] OR;  Service: Thoracic;  Laterality: Right;   LYMPH NODE DISSECTION Right 09/02/2016   Procedure: LYMPH NODE DISSECTION, RIGHT LUNG;  Surgeon: Zelphia Higashi, MD;  Location: [facility] OR;  Service: Thoracic;  Laterality: Right;   MITRAL VALVE REPAIR N/A 03/24/2017   Procedure: MITRAL VALVE REPAIR (MVR) with Sorin Carbomedics Annuloflex size 28;  Surgeon: Gardenia Jump, MD;  Location: [facility] OR;  Service: Open Heart Surgery;  Laterality: N/A;   RIGHT/LEFT HEART CATH AND CORONARY ANGIOGRAPHY N/A 08/11/2016   Procedure: Right/Left Heart Cath and Coronary Angiography;  Surgeon: Mahkayla Preece M Swaziland, MD;  Location: [facility] INVASIVE CV LAB;  Service: Cardiovascular;  Laterality: N/A;   TEE  WITHOUT CARDIOVERSION N/A 07/17/2016   Procedure: TRANSESOPHAGEAL ECHOCARDIOGRAM (TEE);  Surgeon: Hazle Lites, MD;  Location: [facility] ENDOSCOPY;  Service: Cardiovascular;  Laterality: N/A;   TEE WITHOUT CARDIOVERSION N/A 03/24/2017   Procedure: TRANSESOPHAGEAL ECHOCARDIOGRAM (TEE);  Surgeon: Gardenia Jump, MD;  Location: [facility] OR;  Service: Open Heart Surgery;  Laterality: N/A;   TONSILLECTOMY  1950's   TOTAL HIP ARTHROPLASTY Left 10/05/2014   dr Rossie Coon   TOTAL HIP ARTHROPLASTY Left 10/05/2014   Procedure: LEFT TOTAL HIP ARTHROPLASTY ANTERIOR APPROACH;  Surgeon: Liliane Rei, MD;  Location: [facility] OR;  Service: Orthopedics;  Laterality: Left;   VIDEO ASSISTED THORACOSCOPY (VATS)/WEDGE RESECTION Right 09/02/2016   Procedure: VIDEO ASSISTED THORACOSCOPY (VATS)/RIGHT UPPER LOBE WEDGE RESECTION;  Surgeon: Zelphia Higashi, MD;  Location: [facility] OR;  Service: Thoracic;  Laterality: Right;   VIDEO BRONCHOSCOPY WITH ENDOBRONCHIAL NAVIGATION N/A 10/19/2012   Procedure: VIDEO BRONCHOSCOPY WITH ENDOBRONCHIAL NAVIGATION;  Surgeon: Denson Flake, MD;  Location: [facility] OR;  Service: Thoracic;  Laterality: N/A;   WRIST  RECONSTRUCTION Left 12/2009   'proximal row carpectomy" [name]    Current Medications: Outpatient Medications Prior to Visit  Medication Sig Dispense Refill   clindamycin (CLEOCIN T) 1 % external solution APPLY TOPICALLY TWICE A DAY 60 mL 0   Evolocumab (REPATHA SURECLICK) 140 MG/ML SOAJ Inject 140 mg into the skin every 14 (fourteen) days. 6 mL 3   ezetimibe (ZETIA) 10 MG tablet TAKE 1 TABLET BY MOUTH EVERY DAY 90 tablet 3   furosemide (LASIX) 20 MG tablet Take 1 tablet (20 mg total) by mouth daily as needed for edema. (Patient taking differently: Take 20 mg by mouth 3 (three) times a week.) 90 tablet 2   JARDIANCE 10 MG TABS tablet TAKE 1 TABLET BY MOUTH DAILY BEFORE BREAKFAST. 90 tablet 0   methocarbamol (ROBAXIN) 500 MG tablet Take 1 tablet (500 mg total) by mouth every 8 (eight) hours as needed for muscle spasms. 270 tablet 1   metoprolol tartrate (LOPRESSOR) 25 MG tablet TAKE 1/2 TABLET TWICE A DAY BY MOUTH 90 tablet 3   Multiple Vitamin (MULTIVITAMIN) tablet Take 1 tablet by mouth daily.     Omega-3 Fatty Acids (FISH OIL) 500 MG CAPS Take 500 mg by mouth daily.      osimertinib mesylate (TAGRISSO) 80 MG tablet TAKE 1 TABLET (80 MG TOTAL) BY MOUTH DAILY. 30 tablet 2   rivaroxaban (XARELTO) 20 MG TABS tablet Take 1 tablet (20 mg total) by mouth daily with supper. 90 tablet 3   sildenafil (REVATIO) 20 MG tablet Take 4 tablets (80 mg total) by mouth daily as needed. 60 tablet 5   No facility-administered medications prior to visit.     Allergies:   Symbicort [budesonide-formoterol fumarate] and Amoxicillin   Social History   Socioeconomic History   Marital status: Married    Spouse name: Nadean August   Number of children: 3   Years of education: Not on file   Highest education level: Bachelor's degree (e.g., BA, AB, BS)  Occupational History   Occupation: Retired, Multimedia programmer   Occupation: Retired, Real estate    Comment: Slow  Tobacco Use   Smoking status: Never   Smokeless  tobacco: Never  Vaping Use   Vaping status: Never Used  Substance and Sexual Activity   Alcohol use: Yes    Alcohol/week: 1.0 standard drink of alcohol    Types: 1 Cans of beer per week    Comment: time to time   Drug use:  No   Sexual activity: Never    Partners: Female  Other Topics Concern   Not on file  Social History Narrative   HSG, [employer]. Married '73.  2 sons - '74,   '76;  1 daughter -  '77  6 grandchildren.Work - Teacher, music now retired. ACP - not fully discussed.       Lives with wife-2025   Social Drivers of Health   Financial Resource Strain: Low Risk  (06/23/2023)   Overall Financial Resource Strain (CARDIA)    Difficulty of Paying Living Expenses: Not hard at all  Food Insecurity: No Food Insecurity (06/23/2023)   Hunger Vital Sign    Worried About Running Out of Food in the Last Year: Never true    Ran Out of Food in the Last Year: Never true  Transportation Needs: No Transportation Needs (06/23/2023)   PRAPARE - Administrator, Civil Service (Medical): No    Lack of Transportation (Non-Medical): No  Physical Activity: Insufficiently Active (06/23/2023)   Exercise Vital Sign    Days of Exercise per Week: 2 days    Minutes of Exercise per Session: 30 min  Stress: Stress Concern Present (06/23/2023)   [employer] of Occupational Health - Occupational Stress Questionnaire    Feeling of Stress : To some extent  Social Connections: Unknown (06/23/2023)   Social Connection and Isolation Panel [NHANES]    Frequency of Communication with Friends and Family: Twice a week    Frequency of Social Gatherings with Friends and Family: Twice a week    Attends Religious Services: Patient declined    Database administrator or Organizations: No    Attends Engineer, structural: Not on file    Marital Status: Married  Recent Concern: Social Connections - Socially Isolated (03/27/2023)   Social Connection and Isolation  Panel [NHANES]    Frequency of Communication with Friends and Family: Once a week    Frequency of Social Gatherings with Friends and Family: Once a week    Attends Religious Services: Never    Database administrator or Organizations: No    Attends Engineer, structural: Not on file    Marital Status: Married     Family History:  The patient's family history includes Heart attack in his father; Heart disease in his father and paternal uncle; Heart failure in his mother.   ROS:   Please see the history of present illness.    ROS All other systems reviewed and are negative.   PHYSICAL EXAM:   VS:  BP 136/78 (BP Location: Left Arm, Patient Position: Sitting, Cuff Size: Normal)   Pulse 76   Ht 5\' 7"  (1.702 m)   Wt 228 lb 3.2 oz (103.5 kg)   SpO2 93%   BMI 35.74 kg/m    GENERAL:  Well appearing WM in NAD HEENT:  PERRL, EOMI, sclera are clear. Oropharynx is clear. NECK:  No jugular venous distention, carotid upstroke brisk and symmetric, no bruits, no thyromegaly or adenopathy LUNGS:  Clear to auscultation bilaterally CHEST:  Unremarkable HEART:  RRR,  PMI not displaced or sustained,S1 and S2 within normal limits, no S3, no S4: no clicks, no rubs, gr 2/6 systolic murmur RUSB ABD:  Soft, nontender. BS +, no masses or bruits. No hepatomegaly, no splenomegaly EXT:  2 + pulses throughout, 1-2+ pitting ankle edema, chronic stasis skin changes.  SKIN:  Warm and dry.  No rashes NEURO:  Alert and oriented  x 3. Cranial nerves II through XII intact. PSYCH:  Cognitively intact      Wt Readings from Last 3 Encounters:  08/25/23 228 lb 3.2 oz (103.5 kg)  07/08/23 226 lb 14.4 oz (102.9 kg)  06/30/23 225 lb (102.1 kg)      Studies/Labs Reviewed:         Recent Labs: 07/08/2023: ALT 23; BUN 18; Creatinine 1.42; Hemoglobin 15.9; Platelet Count 142; Potassium 4.3; Sodium 141   Lipid Panel    Component Value Date/Time   CHOL 121 03/11/2023 1404   TRIG 133 03/11/2023 1404    TRIG 108 05/20/2006 0825   HDL 56 03/11/2023 1404   CHOLHDL 2.2 03/11/2023 1404   CHOLHDL 4 03/18/2022 1557   VLDL 19.4 03/18/2022 1557   LDLCALC 42 03/11/2023 1404   LDLCALC 136 (H) 12/26/2019 1150   LDLDIRECT 177.0 07/24/2015 1052    Additional studies/ records that were reviewed today include:    Cath 08/11/2016 Conclusion     Prox LAD to Mid LAD lesion, 30 %stenosed. Mid LAD to Dist LAD lesion, 65 %stenosed. Prox RCA to Mid RCA lesion, 15 %stenosed. The left ventricular systolic function is normal. LV end diastolic pressure is normal. The left ventricular ejection fraction is 55-65% by visual estimate. LV end diastolic pressure is normal.   1. Borderline single vessel obstructive CAD involving the mid LAD. FFR 0.81 2. Normal LV function EF 65% 3. Normal right heart and LV filling pressures 4. Normal Cardiac output   Plan: will refer to CT surgery for consideration of MV repair. The stenosis in the LAD will be discussed. He is asymptomatic and I would favor treating it medically. If he develops symptoms in the future it could be treated with PCI.         Echo 02/15/2017 LV EF: 60% -   65%  Study Conclusions   - Left ventricle: The cavity size was normal. Wall thickness was   normal. Systolic function was normal. The estimated ejection   fraction was in the range of 60% to 65%. - Aortic valve: AV is thickened, calcified with minimally   restricted motion. - Mitral valve: Prolapse fo the posterior mitral leaflet. MR is at   least moderate in intensity Calcified annulus. Mildly thickened   leaflets . - Left atrium: The atrium was mildly dilated.     CABG with MVR 03/24/2017 Preoperative Diagnosis:       Severe Mitral Regurgitation Single-vessel Coronary Artery Disease   Postoperative Diagnosis:    Same   Procedure:        Mitral Valve Repair             Complex valvuloplasty including artificial Gore-tex neochord placement x6             Sorin Carbomedics  Annuloflex posterior annuloplasty band (size 28mm, catalog #[id number], serial #[id number])   Coronary Artery Bypass Grafting x 1              Left Internal Mammary Artery to Distal Left Anterior Descending Coronary Artery   Echo 06/10/17: Study Conclusions   - Left ventricle: The cavity size was normal. There was moderate   concentric hypertrophy. Systolic function was vigorous. The   estimated ejection fraction was in the range of 65% to 70%. Wall   motion was normal; there were no regional wall motion   abnormalities. Doppler parameters are consistent with abnormal   left ventricular relaxation (grade 1 diastolic dysfunction). - Aortic valve: Trileaflet; mildly thickened, mildly  calcified   leaflets. Transvalvular velocity was minimally increased. There   was no stenosis. There was no regurgitation. - Mitral valve: S/P mitral valve repair with fixed posterior   leaflet. Transvalvular velocity was elevated. There was no   regurgitation. - Right ventricle: The cavity size was normal. Wall thickness was   normal. Systolic function was normal. - Tricuspid valve: There was no regurgitation. - Pericardium, extracardiac: A mild pericardial effusion was   identified posterior to the heart. Features were not consistent   with tamponade physiology.   Impressions:   - Since the last study on 02/15/2017 mitral valve is post repair.   Transmitral velocities are elevated with mean gradient 8 mmHg.   LVEF is hyperdynamic.  Echo 02/07/18: Study Conclusions   - Left ventricle: The cavity size was mildly reduced. Wall   thickness was increased in a pattern of mild LVH. Systolic   function was vigorous. The estimated ejection fraction was in the   range of 65% to 70%. - Mitral valve: s/p MV repair. Motion is difficult to see due to   poor acoustic windows Peak and mean gradiaeants through the valve   are 7 and 4 mm Hg respectively. MVA by P T1/2 is 1.75 cm2   consistent with mild MS - Right  ventricle: RV is difficult to visualize, even with Definity   OVerall RVEF appears mildly reduced. The cavity size was mildly   dilated. - Right atrium: The atrium was mildly dilated.  Echo 01/29/19: IMPRESSIONS     1. Left ventricular ejection fraction, by visual estimation, is 50 to  55%. The left ventricle has normal function. Normal left ventricular size.  Left ventricular septal wall thickness was mildly increased. Mildly  increased left ventricular posterior  wall thickness. There is mildly increased left ventricular hypertrophy.   2. Elevated left ventricular end-diastolic pressure.   3. Left ventricular diastolic Doppler parameters are consistent with  pseudonormalization pattern of LV diastolic filling.   4. Global right ventricle has normal systolic function.The right  ventricular size is normal. No increase in right ventricular wall  thickness.   5. Left atrial size was normal.   6. Right atrial size was normal.   7. The mitral valve is normal in structure. Mild mitral valve  regurgitation. Mild mitral stenosis.   8. The tricuspid valve is normal in structure. Tricuspid valve  regurgitation is mild.   9. The aortic valve is normal in structure. Aortic valve regurgitation  was not visualized by color flow Doppler. Structurally normal aortic  valve, with no evidence of sclerosis or stenosis.  10. Mild thickening and calcification of the aortic valve leaflets.  11. The pulmonic valve was normal in structure. Pulmonic valve  regurgitation is trivial by color flow Doppler.  12. Normal pulmonary artery systolic pressure.  13. The inferior vena cava is normal in size with greater than 50%  respiratory variability, suggesting right atrial pressure of 3 mmHg.   ASSESSMENT:    1. Chronic diastolic congestive heart failure (HCC)   2. S/P mitral valve repair   3. Adenocarcinoma of right lung, stage 4 (HCC)   4. Stage 3a chronic kidney disease (HCC)   5. Coronary artery disease  involving native coronary artery of native heart without angina pectoris   6. Recurrent pulmonary embolism (HCC)         PLAN:  In order of problems listed above:  Severe MR s/p mitral valve annuloplasty/repair:  F/U Echo 2020  showed an excellent repair.  Recommend routine SBE prophylaxis.   2.   History of PE now recurrent on 02/06/18. Submassive with RV strain. S/p EKOS and lytic therapy. On Xarelto now.  Will need anticoagulation indefinitely.   3.    Chronic diastolic heart failure: weight is up. Some edema on exam.  Continue  Jardiance.  He does have venous insuffieciency. Recommend he take lasix at least 4 days a week. Sodium restriction. Support hose. Will update Echo now.   4.    S/p CABG x 1: LIMA to LAD. No angina.   5.    Stage IV lung CA. S/p resection with recurrent malignant pleural effusion. On oral chemotherapy.  S/p RT for recurrent lung nodule. Stable. Due for follow up CT scans next month  6.   DM 2: Managed by primary care provider- now on Jardiance.  7.   HLD off statin due to history of elevated CK on Lipitor. On Zetia. Now on Repatha. Last LDL excellent.   8. CKD stage 3a. Followed by Nephrology. Last creatinine 1.45  Follow up in 6 months.  Medication Adjustments/Labs and Tests Ordered: Current medicines are reviewed at length with the patient today.  Concerns regarding medicines are outlined above.  Medication changes, Labs and Tests ordered today are listed in the Patient Instructions below. There are no Patient Instructions on file for this visit.   Signed, Nathalie Cavendish Swaz[place], MD  08/25/2023 3:03 PM    [facility] [address] [place], [place], [place]  [address] Phone: [phone]; Fax: [phone]

## 2023-08-24 ENCOUNTER — Other Ambulatory Visit: Payer: Self-pay | Admitting: Pharmacy Technician

## 2023-08-24 ENCOUNTER — Other Ambulatory Visit: Payer: Self-pay

## 2023-08-24 NOTE — Progress Notes (Signed)
 Specialty Pharmacy Refill Coordination Note  James [name] is a 73 y.o. male contacted today regarding refills of specialty medication(s) Osimertinib Mesylate (TAGRISSO)   Patient requested [name] at [facility] at [place] date: 08/31/23   Medication will be filled on 08/30/23.

## 2023-08-25 ENCOUNTER — Ambulatory Visit: Payer: Medicare PPO | Attending: Cardiology | Admitting: Cardiology

## 2023-08-25 ENCOUNTER — Encounter: Payer: Self-pay | Admitting: Cardiology

## 2023-08-25 VITALS — BP 136/78 | HR 76 | Ht 67.0 in | Wt 228.2 lb

## 2023-08-25 DIAGNOSIS — I2699 Other pulmonary embolism without acute cor pulmonale: Secondary | ICD-10-CM

## 2023-08-25 DIAGNOSIS — I5032 Chronic diastolic (congestive) heart failure: Secondary | ICD-10-CM | POA: Diagnosis not present

## 2023-08-25 DIAGNOSIS — Z9889 Other specified postprocedural states: Secondary | ICD-10-CM | POA: Diagnosis not present

## 2023-08-25 DIAGNOSIS — I2510 Atherosclerotic heart disease of native coronary artery without angina pectoris: Secondary | ICD-10-CM | POA: Diagnosis not present

## 2023-08-25 DIAGNOSIS — C3491 Malignant neoplasm of unspecified part of right bronchus or lung: Secondary | ICD-10-CM | POA: Diagnosis not present

## 2023-08-25 DIAGNOSIS — N1831 Chronic kidney disease, stage 3a: Secondary | ICD-10-CM

## 2023-08-25 NOTE — Patient Instructions (Signed)
 Medication Instructions:  Take Lasix at least 4 times a week Continue all other medications *If you need a refill on your cardiac medications before your next appointment, please call your pharmacy*  Lab Work: None ordered  Testing/Procedures: Echo   First available  Follow-Up: At [facility], you and your health needs are our priority.  As part of our continuing mission to provide you with exceptional heart care, our providers are all part of one team.  This team includes your primary Cardiologist (physician) and Advanced Practice Providers or APPs (Physician Assistants and Nurse Practitioners) who all work together to provide you with the care you need, when you need it.  Your next appointment:  6 months   Call in July to schedule Oct appointment    Provider:  Dr.Jordan   We recommend signing up for the patient portal called "MyChart".  Sign up information is provided on this After Visit Summary.  MyChart is used to connect with patients for Virtual Visits (Telemedicine).  Patients are able to view lab/test results, encounter notes, upcoming appointments, etc.  Non-urgent messages can be sent to your provider as well.   To learn more about what you can do with MyChart, go to [email or web address].        1st Floor: - Lobby - Registration  - Pharmacy  - Lab - Cafe  2nd Floor: - PV Lab - Diagnostic Testing (echo, CT, nuclear med)  3rd Floor: - Vacant  4th Floor: - TCTS (cardiothoracic surgery) - AFib Clinic - Structural Heart Clinic - Vascular Surgery  - Vascular Ultrasound  5th Floor: - HeartCare Cardiology (general and EP) - Clinical Pharmacy for coumadin, hypertension, lipid, weight-loss medications, and med management appointments    Valet parking services will be available as well.

## 2023-08-30 ENCOUNTER — Other Ambulatory Visit: Payer: Self-pay | Admitting: Internal Medicine

## 2023-08-30 ENCOUNTER — Other Ambulatory Visit: Payer: Self-pay

## 2023-08-30 DIAGNOSIS — N1831 Chronic kidney disease, stage 3a: Secondary | ICD-10-CM

## 2023-08-30 DIAGNOSIS — I5032 Chronic diastolic (congestive) heart failure: Secondary | ICD-10-CM

## 2023-08-30 DIAGNOSIS — E118 Type 2 diabetes mellitus with unspecified complications: Secondary | ICD-10-CM

## 2023-09-02 ENCOUNTER — Encounter: Payer: Self-pay | Admitting: Internal Medicine

## 2023-09-03 ENCOUNTER — Other Ambulatory Visit: Payer: Self-pay | Admitting: Internal Medicine

## 2023-09-03 DIAGNOSIS — N1831 Chronic kidney disease, stage 3a: Secondary | ICD-10-CM

## 2023-09-03 DIAGNOSIS — E118 Type 2 diabetes mellitus with unspecified complications: Secondary | ICD-10-CM

## 2023-09-03 DIAGNOSIS — I5032 Chronic diastolic (congestive) heart failure: Secondary | ICD-10-CM

## 2023-09-06 ENCOUNTER — Other Ambulatory Visit: Payer: Self-pay

## 2023-09-06 DIAGNOSIS — E118 Type 2 diabetes mellitus with unspecified complications: Secondary | ICD-10-CM

## 2023-09-06 DIAGNOSIS — N1831 Chronic kidney disease, stage 3a: Secondary | ICD-10-CM

## 2023-09-06 DIAGNOSIS — I5032 Chronic diastolic (congestive) heart failure: Secondary | ICD-10-CM

## 2023-09-06 MED ORDER — EMPAGLIFLOZIN 10 MG PO TABS
10.0000 mg | ORAL_TABLET | Freq: Every day | ORAL | 0 refills | Status: DC
Start: 2023-09-06 — End: 2023-12-02

## 2023-09-16 DIAGNOSIS — N1831 Chronic kidney disease, stage 3a: Secondary | ICD-10-CM | POA: Diagnosis not present

## 2023-09-21 ENCOUNTER — Other Ambulatory Visit: Payer: Self-pay | Admitting: Internal Medicine

## 2023-09-21 ENCOUNTER — Other Ambulatory Visit: Payer: Self-pay

## 2023-09-21 ENCOUNTER — Other Ambulatory Visit: Payer: Self-pay | Admitting: Pharmacy Technician

## 2023-09-21 ENCOUNTER — Other Ambulatory Visit (HOSPITAL_COMMUNITY): Payer: Self-pay

## 2023-09-21 DIAGNOSIS — C3491 Malignant neoplasm of unspecified part of right bronchus or lung: Secondary | ICD-10-CM

## 2023-09-21 MED ORDER — OSIMERTINIB MESYLATE 80 MG PO TABS
ORAL_TABLET | Freq: Every day | ORAL | 2 refills | Status: DC
  Filled 2023-09-21 (×2): qty 30, 30d supply, fill #0
  Filled 2023-10-18: qty 30, 30d supply, fill #1
  Filled 2023-11-16: qty 30, 30d supply, fill #2

## 2023-09-21 NOTE — Progress Notes (Signed)
 Specialty Pharmacy Refill Coordination Note  James [name] is a 73 y.o. male contacted today regarding refills of specialty medication(s) Osimertinib  Mesylate (TAGRISSO )   Patient requested [name] at [facility] at [place] date: 09/27/23   Medication will be filled on 09/24/23.  This fill date is pending response to refill request from provider. Patient is aware and if they have not received fill by intended date they must follow up with pharmacy.

## 2023-09-22 DIAGNOSIS — N2581 Secondary hyperparathyroidism of renal origin: Secondary | ICD-10-CM | POA: Diagnosis not present

## 2023-09-22 DIAGNOSIS — I129 Hypertensive chronic kidney disease with stage 1 through stage 4 chronic kidney disease, or unspecified chronic kidney disease: Secondary | ICD-10-CM | POA: Diagnosis not present

## 2023-09-22 DIAGNOSIS — E1122 Type 2 diabetes mellitus with diabetic chronic kidney disease: Secondary | ICD-10-CM | POA: Diagnosis not present

## 2023-09-22 DIAGNOSIS — N1831 Chronic kidney disease, stage 3a: Secondary | ICD-10-CM | POA: Diagnosis not present

## 2023-09-22 DIAGNOSIS — D631 Anemia in chronic kidney disease: Secondary | ICD-10-CM | POA: Diagnosis not present

## 2023-09-27 ENCOUNTER — Encounter (HOSPITAL_COMMUNITY): Payer: Self-pay

## 2023-09-27 ENCOUNTER — Ambulatory Visit (HOSPITAL_COMMUNITY)
Admission: RE | Admit: 2023-09-27 | Discharge: 2023-09-27 | Disposition: A | Discharge status: Home / Self Care | Payer: Medicare PPO | Source: Ambulatory Visit | Attending: Internal Medicine | Admitting: Internal Medicine

## 2023-09-27 ENCOUNTER — Other Ambulatory Visit: Payer: Medicare PPO

## 2023-09-27 ENCOUNTER — Inpatient Hospital Stay: Payer: Medicare PPO | Attending: Internal Medicine

## 2023-09-27 DIAGNOSIS — R110 Nausea: Secondary | ICD-10-CM | POA: Insufficient documentation

## 2023-09-27 DIAGNOSIS — C3490 Malignant neoplasm of unspecified part of unspecified bronchus or lung: Secondary | ICD-10-CM

## 2023-09-27 DIAGNOSIS — R21 Rash and other nonspecific skin eruption: Secondary | ICD-10-CM | POA: Insufficient documentation

## 2023-09-27 DIAGNOSIS — N281 Cyst of kidney, acquired: Secondary | ICD-10-CM | POA: Diagnosis not present

## 2023-09-27 DIAGNOSIS — K5900 Constipation, unspecified: Secondary | ICD-10-CM | POA: Insufficient documentation

## 2023-09-27 DIAGNOSIS — Z79899 Other long term (current) drug therapy: Secondary | ICD-10-CM | POA: Insufficient documentation

## 2023-09-27 DIAGNOSIS — Z902 Acquired absence of lung [part of]: Secondary | ICD-10-CM | POA: Diagnosis not present

## 2023-09-27 DIAGNOSIS — C3411 Malignant neoplasm of upper lobe, right bronchus or lung: Secondary | ICD-10-CM | POA: Insufficient documentation

## 2023-09-27 DIAGNOSIS — R0602 Shortness of breath: Secondary | ICD-10-CM | POA: Diagnosis not present

## 2023-09-27 DIAGNOSIS — N400 Enlarged prostate without lower urinary tract symptoms: Secondary | ICD-10-CM | POA: Diagnosis not present

## 2023-09-27 LAB — CMP (CANCER CENTER ONLY)
ALT: 22 U/L (ref 0–44)
AST: 24 U/L (ref 15–41)
Albumin: 4.3 g/dL (ref 3.5–5.0)
Alkaline Phosphatase: 55 U/L (ref 38–126)
Anion gap: 4 — ABNORMAL LOW (ref 5–15)
BUN: 23 mg/dL (ref 8–23)
CO2: 26 mmol/L (ref 22–32)
Calcium: 9.2 mg/dL (ref 8.9–10.3)
Chloride: 110 mmol/L (ref 98–111)
Creatinine: 1.37 mg/dL — ABNORMAL HIGH (ref 0.61–1.24)
GFR, Estimated: 54 mL/min — ABNORMAL LOW (ref >60)
Glucose, Bld: 114 mg/dL — ABNORMAL HIGH (ref 70–99)
Potassium: 4.7 mmol/L (ref 3.5–5.1)
Sodium: 140 mmol/L (ref 135–145)
Total Bilirubin: 0.4 mg/dL (ref 0.0–1.2)
Total Protein: 6.8 g/dL (ref 6.5–8.1)

## 2023-09-27 LAB — CBC WITH DIFFERENTIAL (CANCER CENTER ONLY)
Abs Immature Granulocytes: 0.02 10*3/uL (ref 0.00–0.07)
Basophils Absolute: 0 10*3/uL (ref 0.0–0.1)
Basophils Relative: 1 %
Eosinophils Absolute: 0.1 10*3/uL (ref 0.0–0.5)
Eosinophils Relative: 2 %
HCT: 46.7 % (ref 39.0–52.0)
Hemoglobin: 15.8 g/dL (ref 13.0–17.0)
Immature Granulocytes: 0 %
Lymphocytes Relative: 29 %
Lymphs Abs: 1.8 10*3/uL (ref 0.7–4.0)
MCH: 31.5 pg (ref 26.0–34.0)
MCHC: 33.8 g/dL (ref 30.0–36.0)
MCV: 93 fL (ref 80.0–100.0)
Monocytes Absolute: 0.8 10*3/uL (ref 0.1–1.0)
Monocytes Relative: 13 %
Neutro Abs: 3.5 10*3/uL (ref 1.7–7.7)
Neutrophils Relative %: 55 %
Platelet Count: 136 10*3/uL — ABNORMAL LOW (ref 150–400)
RBC: 5.02 MIL/uL (ref 4.22–5.81)
RDW: 15.6 % — ABNORMAL HIGH (ref 11.5–15.5)
WBC Count: 6.4 10*3/uL (ref 4.0–10.5)
nRBC: 0 % (ref 0.0–0.2)

## 2023-09-27 MED ORDER — SODIUM CHLORIDE (PF) 0.9 % IJ SOLN
INTRAMUSCULAR | Status: AC
  Filled 2023-09-27: qty 50

## 2023-09-27 MED ORDER — IOHEXOL 300 MG/ML  SOLN
100.0000 mL | Freq: Once | INTRAMUSCULAR | Status: AC | PRN
Start: 2023-09-27 — End: 2023-09-27
  Administered 2023-09-27: 100 mL via INTRAVENOUS

## 2023-09-30 ENCOUNTER — Ambulatory Visit (HOSPITAL_COMMUNITY)
Admission: RE | Admit: 2023-09-30 | Discharge: 2023-09-30 | Disposition: A | Discharge status: Home / Self Care | Source: Ambulatory Visit | Attending: Cardiology | Admitting: Cardiology

## 2023-09-30 DIAGNOSIS — I5032 Chronic diastolic (congestive) heart failure: Secondary | ICD-10-CM | POA: Diagnosis not present

## 2023-09-30 DIAGNOSIS — I2699 Other pulmonary embolism without acute cor pulmonale: Secondary | ICD-10-CM | POA: Diagnosis not present

## 2023-09-30 DIAGNOSIS — N1831 Chronic kidney disease, stage 3a: Secondary | ICD-10-CM

## 2023-09-30 DIAGNOSIS — Z9889 Other specified postprocedural states: Secondary | ICD-10-CM

## 2023-09-30 DIAGNOSIS — I2510 Atherosclerotic heart disease of native coronary artery without angina pectoris: Secondary | ICD-10-CM

## 2023-09-30 DIAGNOSIS — C3491 Malignant neoplasm of unspecified part of right bronchus or lung: Secondary | ICD-10-CM

## 2023-10-01 LAB — ECHOCARDIOGRAM COMPLETE
AR max vel: 1.49 cm2
AV Area VTI: 1.35 cm2
AV Area mean vel: 1.42 cm2
AV Mean grad: 12.3 mmHg
AV Peak grad: 22 mmHg
Ao pk vel: 2.35 m/s
Area-P 1/2: 1.83 cm2
MV VTI: 1.08 cm2
S' Lateral: 3.2 cm

## 2023-10-04 ENCOUNTER — Ambulatory Visit: Payer: Self-pay | Admitting: Cardiology

## 2023-10-06 ENCOUNTER — Other Ambulatory Visit: Payer: Self-pay

## 2023-10-06 ENCOUNTER — Inpatient Hospital Stay: Payer: Medicare PPO | Admitting: Internal Medicine

## 2023-10-06 ENCOUNTER — Other Ambulatory Visit: Payer: Self-pay | Admitting: Cardiology

## 2023-10-06 VITALS — BP 120/68 | HR 83 | Temp 98.8°F | Resp 18 | Ht 67.0 in | Wt 226.4 lb

## 2023-10-06 DIAGNOSIS — R0602 Shortness of breath: Secondary | ICD-10-CM | POA: Diagnosis not present

## 2023-10-06 DIAGNOSIS — C3491 Malignant neoplasm of unspecified part of right bronchus or lung: Secondary | ICD-10-CM | POA: Diagnosis not present

## 2023-10-06 DIAGNOSIS — R21 Rash and other nonspecific skin eruption: Secondary | ICD-10-CM | POA: Diagnosis not present

## 2023-10-06 DIAGNOSIS — Z9889 Other specified postprocedural states: Secondary | ICD-10-CM

## 2023-10-06 DIAGNOSIS — Z902 Acquired absence of lung [part of]: Secondary | ICD-10-CM | POA: Diagnosis not present

## 2023-10-06 DIAGNOSIS — R110 Nausea: Secondary | ICD-10-CM | POA: Diagnosis not present

## 2023-10-06 DIAGNOSIS — C3411 Malignant neoplasm of upper lobe, right bronchus or lung: Secondary | ICD-10-CM | POA: Diagnosis not present

## 2023-10-06 DIAGNOSIS — Z79899 Other long term (current) drug therapy: Secondary | ICD-10-CM | POA: Diagnosis not present

## 2023-10-06 DIAGNOSIS — K5900 Constipation, unspecified: Secondary | ICD-10-CM | POA: Diagnosis not present

## 2023-10-06 NOTE — Progress Notes (Signed)
 [facility] [phone]   [phone]  OFFICE PROGRESS NOTE  Arcadio Knuckles, MD [address] [place] [address]  DIAGNOSIS: Recurrent non-small cell lung cancer, adenocarcinoma initially diagnosed as stage IA (T1c, N0, M0) non-small cell lung cancer, adenocarcinoma presented with right upper lobe lung nodule in April 2018 with recurrence in June 2019.  Biomarker Findings Microsatellite status - MS-Stable Tumor Mutational Burden - TMB-Low (3 Muts/Mb) Genomic Findings For a complete list of the genes assayed, please refer to the Appendix. EGFR exon 19 deletion ([id number]>P) CDKN2A/B loss NKX2-1 amplification 7 Disease relevant genes with no reportable alterations: KRAS, ALK, BRAF, MET, RET, ERBB2, ROS1   PRIOR THERAPY:  1) Status post right upper lobectomy with lymph node dissection under the care of Dr. Luna Salinas on 09/02/2016. 2) status post palliative radiotherapy to enlarging right upper lobe lung nodule under the care of Dr. Jeryl Moris completed on Oct 07, 2021.  CURRENT THERAPY: Tagrisso  80 mg p.o. daily.  First dose started November 05, 2017.  Status post 71  months of treatment.  INTERVAL HISTORY: James [name] 73 y.o. male returns to the clinic today for his 3 months follow-up visit accompanied by his wife. Discussed the use of AI scribe software for clinical note transcription with the patient, who gave verbal consent to proceed.  History of Present Illness   [name] "James [name]" is a 73 year old male with recurrent non-small cell lung cancer who presents for evaluation with repeat CT scan for restaging of his disease. He is accompanied by his wife.  He has a history of recurrent non-small cell lung cancer, adenocarcinoma, initially diagnosed as stage 1A in April 2018. He underwent a right upper lobectomy with lymph node dissection. In June 2019, evidence of metastatic disease was found, and the tumor tested positive for EGFR exon  19 deletion. Since June 2019, he has been on Tagrisso  80 mg PO daily, with a treatment duration of 71 months.  He experiences increased shortness of breath over the past three months. He also has constipation for about a month, despite dietary measures such as eating prunes. He notes a facial rash that has developed recently and occasional nausea that occurs spontaneously, not related to the timing of his Tagrisso  intake.  He recently visited a nephrologist where blood work showed low CO2 levels. He was prescribed bicarbonate of soda 325 mg twice a day, but he has not started this medication due to concerns about his no-salt diet and potential interactions with his blood pressure management. His CO2 levels have fluctuated, with a recent reading of 26, which he notes is within his normal range. He is cautious about starting new medications that may affect his sodium intake due to a history of swelling and the need for Lasix  when consuming salt.         MEDICAL HISTORY: Past Medical History:  Diagnosis Date   Adenocarcinoma of right lung, stage 1 (HCC) 09/06/2016   Anxiety    Arthritis    "knees, hips, back" (10/19/2012)   Chronic diastolic congestive heart failure (HCC)    Chronic lower back pain    Colon polyps    10/27/2002, repeat letter 09/17/2007   Coronary artery disease    Coronary artery disease involving native coronary artery of native heart without angina pectoris    Depressive disorder, not elsewhere classified    no meds   Diabetes mellitus without complication (HCC)    diet controlled- no  med  (while in hosp 4/18 -elevated cbg   Dyspnea    Fasting hyperglycemia    GERD (gastroesophageal reflux disease)    Heart murmur    Hemoptysis    abnormal CT Chest 01/29/10 - ? new GG changes RUL > not viz on plain cxr 02/26/2010   Hypertension    Mitral regurgitation    severe MR 08/2016   MPN (myeloproliferative neoplasm) (HCC)    1st detected 06/04/1998   Obesity    OSA on CPAP     last sleep study 10 years ago   Other and unspecified hyperlipidemia    Peripheral vascular disease (HCC) 08/2016   after lung surgey small clots in lungs,after hip dvt-5/16   Pneumonia    4/18   Positive PPD 1965   "non reactive in 2012" (10/19/2012)   Routine general medical examination at a health care facility    S/P CABG x 1 03/24/2017   LIMA to LAD   S/P mitral valve repair 03/24/2017   Complex valvuloplasty including artificial Gore-tex neochord placement x6 and 28 mm Sorin Annuloflex posterior annuloplasty band   Special screening for malignant neoplasm of prostate    Spinal stenosis, unspecified region other than cervical    Wrist pain, left     ALLERGIES:  is allergic to symbicort  [budesonide -formoterol  fumarate] and amoxicillin.  MEDICATIONS:  Current Outpatient Medications  Medication Sig Dispense Refill   clindamycin  (CLEOCIN  T) 1 % external solution APPLY TOPICALLY TWICE A DAY 60 mL 0   empagliflozin  (JARDIANCE ) 10 MG TABS tablet Take 1 tablet (10 mg total) by mouth daily before breakfast. 90 tablet 0   Evolocumab  (REPATHA  SURECLICK) 140 MG/ML SOAJ Inject 140 mg into the skin every 14 (fourteen) days. 6 mL 3   ezetimibe  (ZETIA ) 10 MG tablet TAKE 1 TABLET BY MOUTH EVERY DAY 90 tablet 3   furosemide  (LASIX ) 20 MG tablet Take 1 tablet (20 mg total) by mouth daily as needed for edema. (Patient taking differently: Take 20 mg by mouth 3 (three) times a week.) 90 tablet 2   methocarbamol  (ROBAXIN ) 500 MG tablet Take 1 tablet (500 mg total) by mouth every 8 (eight) hours as needed for muscle spasms. 270 tablet 1   metoprolol  tartrate (LOPRESSOR ) 25 MG tablet TAKE 1/2 TABLET TWICE A DAY BY MOUTH 90 tablet 3   Multiple Vitamin (MULTIVITAMIN) tablet Take 1 tablet by mouth daily.     Omega-3 Fatty Acids (FISH OIL) 500 MG CAPS Take 500 mg by mouth daily.      osimertinib  mesylate (TAGRISSO ) 80 MG tablet TAKE 1 TABLET (80 MG TOTAL) BY MOUTH DAILY. 30 tablet 2   rivaroxaban  (XARELTO )  20 MG TABS tablet Take 1 tablet (20 mg total) by mouth daily with supper. 90 tablet 3   sildenafil  (REVATIO ) 20 MG tablet Take 4 tablets (80 mg total) by mouth daily as needed. 60 tablet 5   No current facility-administered medications for this visit.    SURGICAL HISTORY:  Past Surgical History:  Procedure Laterality Date   ANTERIOR CRUCIATE LIGAMENT REPAIR Left 1967   CARDIAC CATHETERIZATION  2000   CHEST TUBE INSERTION Right 10/19/2012   post bronch   COLONOSCOPY W/ POLYPECTOMY     CORONARY ARTERY BYPASS GRAFT N/A 03/24/2017   Procedure: CORONARY ARTERY BYPASS GRAFTING (CABG)x1 using left internal mammary artery, LIMA-LAD;  Surgeon: Gardenia Jump, MD;  Location: [facility] OR;  Service: Open Heart Surgery;  Laterality: N/A;   CORONARY PRESSURE/FFR STUDY N/A 08/11/2016   Procedure:  Intravascular Pressure Wire/FFR Study;  Surgeon: Peter M [place], MD;  Location: [facility] INVASIVE CV LAB;  Service: Cardiovascular;  Laterality: N/A;   FLEXIBLE BRONCHOSCOPY  10/19/2012   Flexible video fiberoptic bronchoscopy with electromagnetic navigation and biopsies.   IR ANGIOGRAM PULMONARY BILATERAL SELECTIVE  02/06/2018   IR ANGIOGRAM SELECTIVE EACH ADDITIONAL VESSEL  02/06/2018   IR ANGIOGRAM SELECTIVE EACH ADDITIONAL VESSEL  02/06/2018   IR INFUSION THROMBOL ARTERIAL INITIAL (MS)  02/06/2018   IR INFUSION THROMBOL ARTERIAL INITIAL (MS)  02/06/2018   IR THROMB F/U EVAL ART/VEN FINAL DAY (MS)  02/07/2018   IR US  GUIDE VASC ACCESS RIGHT  02/06/2018   LOBECTOMY Right 09/02/2016   Procedure: RIGHT UPPER LOBECTOMY;  Surgeon: Zelphia Higashi, MD;  Location: [facility] OR;  Service: Thoracic;  Laterality: Right;   LYMPH NODE DISSECTION Right 09/02/2016   Procedure: LYMPH NODE DISSECTION, RIGHT LUNG;  Surgeon: Zelphia Higashi, MD;  Location: [facility] OR;  Service: Thoracic;  Laterality: Right;   MITRAL VALVE REPAIR N/A 03/24/2017   Procedure: MITRAL VALVE REPAIR (MVR) with Sorin [employer]omed[employer] Annuloflex size 28;  Surgeon: Gardenia Jump, MD;  Location: [facility] OR;  Service: Open Heart Surgery;  Laterality: N/A;   RIGHT/LEFT HEART CATH AND CORONARY ANGIOGRAPHY N/A 08/11/2016   Procedure: Right/Left Heart Cath and Coronary Angiography;  Surgeon: Peter M [place]azil[place], MD;  Location: [facility] INVASIVE CV LAB;  Service: Cardiovascular;  Laterality: N/A;   TEE WITHOUT CARDIOVERSION N/A 07/17/2016   Procedure: TRANSESOPHAGEAL ECHOCARDIOGRAM (TEE);  Surgeon: Hazle Lites, MD;  Location: [facility] ENDOSCOPY;  Service: Cardiovascular;  Laterality: N/A;   TEE WITHOUT CARDIOVERSION N/A 03/24/2017   Procedure: TRANSESOPHAGEAL ECHOCARDIOGRAM (TEE);  Surgeon: Gardenia Jump, MD;  Location: [facility] OR;  Service: Open Heart Surgery;  Laterality: N/A;   TONSILLECTOMY  1950's   TOTAL HIP ARTHROPLASTY Left 10/05/2014   dr Rossie Coon   TOTAL HIP ARTHROPLASTY Left 10/05/2014   Procedure: LEFT TOTAL HIP ARTHROPLASTY ANTERIOR APPROACH;  Surgeon: Liliane Rei, MD;  Location: [facility] OR;  Service: Orthopedics;  Laterality: Left;   VIDEO ASSISTED THORACOSCOPY (VATS)/WEDGE RESECTION Right 09/02/2016   Procedure: VIDEO ASSISTED THORACOSCOPY (VATS)/RIGHT UPPER LOBE WEDGE RESECTION;  Surgeon: Zelphia Higashi, MD;  Location: [facility] OR;  Service: Thoracic;  Laterality: Right;   VIDEO BRONCHOSCOPY WITH ENDOBRONCHIAL NAVIGATION N/A 10/19/2012   Procedure: VIDEO BRONCHOSCOPY WITH ENDOBRONCHIAL NAVIGATION;  Surgeon: Denson Flake, MD;  Location: [facility] OR;  Service: Thoracic;  Laterality: N/A;   WRIST RECONSTRUCTION Left 12/2009   'proximal row carpectomy" [name]    REVIEW OF SYSTEMS:  Constitutional: negative Eyes: negative Ears, nose, mouth, throat, and face: negative Respiratory: negative Cardiovascular: negative Gastrointestinal: negative Genitourinary:negative Integument/breast: negative Hematologic/lymphatic: negative Musculoskeletal:negative Neurological: negative Behavioral/Psych: negative Endocrine: negative Allergic/Immunologic: negative   PHYSICAL EXAMINATION: General  appearance: alert, cooperative, and no distress Head: Normocephalic, without obvious abnormality, atraumatic Neck: no adenopathy, no JVD, supple, symmetrical, trachea midline, and thyroid  not enlarged, symmetric, no tenderness/mass/nodules Lymph nodes: Cervical, supraclavicular, and axillary nodes normal. Resp: clear to auscultation bilaterally Back: symmetric, no curvature. ROM normal. No CVA tenderness. Cardio: regular rate and rhythm, S1, S2 normal, no murmur, click, rub or gallop GI: soft, non-tender; bowel sounds normal; no masses,  no organomegaly Extremities: extremities normal, atraumatic, no cyanosis or edema Neurologic: Alert and oriented X 3, normal strength and tone. Normal symmetric reflexes. Normal coordination and gait  ECOG PERFORMANCE STATUS: 1 - Symptomatic but completely ambulatory  Blood pressure 120/68, pulse 83, temperature 98.8 F (37.1 C), temperature source Temporal,  resp. rate 18, height 5\' 7"  (1.702 m), weight 226 lb 6.4 oz (102.7 kg), SpO2 97%.  LABORATORY DATA: Lab Results  Component Value Date   WBC 6.4 09/27/2023   HGB 15.8 09/27/2023   HCT 46.7 09/27/2023   MCV 93.0 09/27/2023   PLT 136 (L) 09/27/2023      Chemistry      Component Value Date/Time   NA 140 09/27/2023 1052   NA 142 10/01/2016 1534   K 4.7 09/27/2023 1052   K 4.2 10/01/2016 1534   CL 110 09/27/2023 1052   CO2 26 09/27/2023 1052   CO2 28 10/01/2016 1534   BUN 23 09/27/2023 1052   BUN 15.9 10/01/2016 1534   CREATININE 1.37 (H) 09/27/2023 1052   CREATININE 1.10 02/08/2017 1101   CREATININE 1.5 (H) 10/01/2016 1534      Component Value Date/Time   CALCIUM  9.2 09/27/2023 1052   CALCIUM  9.6 10/01/2016 1534   ALKPHOS 55 09/27/2023 1052   ALKPHOS 65 10/01/2016 1534   AST 24 09/27/2023 1052   AST 20 10/01/2016 1534   ALT 22 09/27/2023 1052   ALT 24 10/01/2016 1534   BILITOT 0.4 09/27/2023 1052   BILITOT 0.46 10/01/2016 1534       RADIOGRAPHIC STUDIES: ECHOCARDIOGRAM  COMPLETE Result Date: 10/01/2023    ECHOCARDIOGRAM REPORT   Patient Name:   James [name] Date of Exam: 09/30/2023 Medical Rec #:  [id number]      Height:       67.0 in Accession #:    [id number]     Weight:       228.2 lb Date of Birth:  [birth year]       BSA:          2.139 m Patient Age:    73 years       BP:           120/80 mmHg Patient Gender: M              HR:           80 bpm. Exam Location:  [address] Procedure: 2D Echo, Cardiac Doppler and Color Doppler (Both Spectral and Color            Flow Doppler were utilized during procedure). Indications:    R060.00 Dyspnea  History:        Patient has prior history of Echocardiogram examinations, most                 recent 02/08/2019. CHF, CAD, Prior CABG, right lung cancer. S/p                 right lobectomy, Mitral Valve Disease, Signs/Symptoms:Shortness                 of Breath; Risk Factors:Diabetes and Sleep Apnea. Previous echo                 LVEF 55%.                  Mitral Valve: prior mitral valve repair - complex valvuloplasty                 including artificial Gore-tex neochord placement x6 and 28 mm                 Sorin annuoloflex posterior annuloplasty band valve is present                 in the mitral position. Procedure  Date: 03/24/2017.  Sonographer:    Donnita Gales BA, RDCS Referring Phys: [id number] [place]  Sonographer Comments: Technically difficult study due to poor echo windows and suboptimal apical window. Attempt IV x2-unable to gain IV access. IMPRESSIONS  1. Left ventricular ejection fraction, by estimation, is 65 to 70%. The left ventricle has normal function. The left ventricle has no regional wall motion abnormalities. There is mild left ventricular hypertrophy. Left ventricular diastolic parameters are indeterminate.  2. Right ventricular systolic function is normal. The right ventricular size is normal.  3. The mitral valve has been repaired/replaced. Mild mitral valve regurgitation. Moderate mitral stenosis. The mean  mitral valve gradient is 9.0 mmHg with average heart rate of 87 bpm. There is a prior mitral valve repair - complex valvuloplasty including artificial Gore-tex neochord placement x6 and 28 mm Sorin annuoloflex posterior annuloplasty band present in the mitral position. Procedure Date: 03/24/2017.  4. The aortic valve is abnormal. There is severe calcifcation of the aortic valve. Aortic valve regurgitation is trivial. Mild to moderate aortic valve stenosis, with DVI 0.43 and SVI 28. Aortic valve area, by VTI measures 1.35 cm. Aortic valve mean gradient measures 12.3 mmHg. Aortic valve Vmax measures 2.35 m/s. FINDINGS  Left Ventricle: Left ventricular ejection fraction, by estimation, is 65 to 70%. The left ventricle has normal function. The left ventricle has no regional wall motion abnormalities. The left ventricular internal cavity size was normal in size. There is  mild left ventricular hypertrophy. Left ventricular diastolic parameters are indeterminate. Right Ventricle: The right ventricular size is normal. No increase in right ventricular wall thickness. Right ventricular systolic function is normal. Left Atrium: Left atrial size was normal in size. Right Atrium: Right atrial size was normal in size. Pericardium: There is no evidence of pericardial effusion. Mitral Valve: The mitral valve has been repaired/replaced. Mild mitral valve regurgitation. There is a prior mitral valve repair - complex valvuloplasty including artificial Gore-tex neochord placement x6 and 28 mm Sorin annuoloflex posterior annuloplasty band present in the mitral position. Procedure Date: 03/24/2017. Moderate mitral valve stenosis. MV peak gradient, 13.5 mmHg. The mean mitral valve gradient is 9.0 mmHg with average heart rate of 87 bpm. Tricuspid Valve: The tricuspid valve is normal in structure. Tricuspid valve regurgitation is mild . No evidence of tricuspid stenosis. Aortic Valve: The aortic valve is abnormal. There is severe  calcifcation of the aortic valve. Aortic valve regurgitation is trivial. Mild to moderate aortic stenosis is present. Aortic valve mean gradient measures 12.3 mmHg. Aortic valve peak gradient measures 22.0 mmHg. Aortic valve area, by VTI measures 1.35 cm. Pulmonic Valve: The pulmonic valve was normal in structure. Pulmonic valve regurgitation is not visualized. No evidence of pulmonic stenosis. Aorta: The aortic root is normal in size and structure. Ascending aorta measurements are within normal limits for age when indexed to body surface area. Venous: The inferior vena cava was not well visualized. IAS/Shunts: No atrial level shunt detected by color flow Doppler.  LEFT VENTRICLE PLAX 2D LVIDd:         5.10 cm LVIDs:         3.20 cm LV PW:         1.10 cm LV IVS:        1.00 cm LVOT diam:     2.00 cm LV SV:         60 LV SV Index:   28 LVOT Area:     3.14 cm  LEFT ATRIUM  Index LA diam:    5.00 cm 2.34 cm/m  AORTIC VALVE AV Area (Vmax):    1.49 cm AV Area (Vmean):   1.42 cm AV Area (VTI):     1.35 cm AV Vmax:           234.67 cm/s AV Vmean:          163.000 cm/s AV VTI:            0.448 m AV Peak Grad:      22.0 mmHg AV Mean Grad:      12.3 mmHg LVOT Vmax:         111.00 cm/s LVOT Vmean:        73.900 cm/s LVOT VTI:          0.192 m LVOT/AV VTI ratio: 0.43  AORTA Ao Root diam: 3.60 cm Ao Asc diam:  3.80 cm MITRAL VALVE                TRICUSPID VALVE MV Area (PHT): 1.83 cm     TR Peak grad:   31.8 mmHg MV Area VTI:   1.08 cm     TR Vmax:        282.00 cm/s MV Peak grad:  13.5 mmHg MV Mean grad:  9.0 mmHg     SHUNTS MV Vmax:       1.84 m/s     Systemic VTI:  0.19 m MV Vmean:      144.0 cm/s   Systemic Diam: 2.00 cm MV Decel Time: 416 msec MV E velocity: 152.50 cm/s MV A velocity: 167.00 cm/s MV E/A ratio:  0.91 Grady Lawman MD Electronically signed by Grady Lawman MD Signature Date/Time: 10/01/2023/6:20:18 PM    Final    CT Chest W Contrast Result Date: 09/27/2023 EXAMINATION: CT ABDOMEN PELVIS  W CONTRAST (accession [id number] [facility]), CT CHEST W CONTRAST (accession [id number] [facility]) CLINICAL INDICATION: Male, 73 years old. Non-small cell lung cancer (NSCLC), staging TECHNIQUE: Axial CT of the abdomen and pelvis with 100 cc Omnipaque  300 intravenous contrast. Multiplanar reformations provided. Unless otherwise specified, incidental thyroid , adrenal, renal lesions do not require dedicated imaging follow up. Additionally, any mentioned pulmonary nodules do not require dedicated imaging follow-up based on the Fleischner guidelines unless otherwise specified. Coronary calcifications are not identified unless otherwise specified. COMPARISON: 03/23/2023 FINDINGS: The visualized thyroid  is normal. The thoracic aorta is normal in caliber. Scattered atherosclerotic changes are present. The main pulmonary artery is normal in caliber. The heart is normal in size. There are coronary calcifications with post-CABG changes. There are calcifications of the aortic valve and mitral annulus. There is no pathologic adenopathy by size criteria. Similar trace right pleural effusion. The trachea and mainstem bronchi are patent. Right upper lobectomy noted with similar posttreatment related changes in the right infrahilar region. Similar pulmonary micronodules in the left upper lobe measuring up to 3 mm. The liver contains a few cysts. The gallbladder is normal. The spleen is normal. The pancreas is normal. The adrenals are normal. The right kidney is normal. Left renal cysts are seen. Abdominal aorta is normal in caliber. Scattered atherosclerotic changes are present. The bladder is normal. The prostate is enlarged. Large and small bowel loops are otherwise within normal limits. There is no free fluid or lymphadenopathy. Left hip arthroplasty is seen. No concerning osseous lesions. There are degenerative changes of the spine and bony pelvis. IMPRESSION: Status post right upper lobectomy with post treatment changes in the right  infrahilar region and no convincing evidence for  active malignancy within the chest, abdomen, or pelvis. DOSE REDUCTION: This exam was performed according to our departmental dose-optimization program which includes automated exposure control, adjustment of the mA and/or kV according to patient size and/or use of iterative reconstruction technique. Electronically signed by: [place] Engel MD 09/27/2023 07:33 PM EDT RP Workstation: [id number]   CT ABDOMEN PELVIS W CONTRAST Result Date: 09/27/2023 EXAMINATION: CT ABDOMEN PELVIS W CONTRAST (accession [id number] [facility]), CT CHEST W CONTRAST (accession [id number] [facility]) CLINICAL INDICATION: Male, 73 years old. Non-small cell lung cancer (NSCLC), staging TECHNIQUE: Axial CT of the abdomen and pelvis with 100 cc Omnipaque  300 intravenous contrast. Multiplanar reformations provided. Unless otherwise specified, incidental thyroid , adrenal, renal lesions do not require dedicated imaging follow up. Additionally, any mentioned pulmonary nodules do not require dedicated imaging follow-up based on the Fleischner guidelines unless otherwise specified. Coronary calcifications are not identified unless otherwise specified. COMPARISON: 03/23/2023 FINDINGS: The visualized thyroid  is normal. The thoracic aorta is normal in caliber. Scattered atherosclerotic changes are present. The main pulmonary artery is normal in caliber. The heart is normal in size. There are coronary calcifications with post-CABG changes. There are calcifications of the aortic valve and mitral annulus. There is no pathologic adenopathy by size criteria. Similar trace right pleural effusion. The trachea and mainstem bronchi are patent. Right upper lobectomy noted with similar posttreatment related changes in the right infrahilar region. Similar pulmonary micronodules in the left upper lobe measuring up to 3 mm. The liver contains a few cysts. The gallbladder is normal. The spleen is normal. The pancreas is normal. The  adrenals are normal. The right kidney is normal. Left renal cysts are seen. Abdominal aorta is normal in caliber. Scattered atherosclerotic changes are present. The bladder is normal. The prostate is enlarged. Large and small bowel loops are otherwise within normal limits. There is no free fluid or lymphadenopathy. Left hip arthroplasty is seen. No concerning osseous lesions. There are degenerative changes of the spine and bony pelvis. IMPRESSION: Status post right upper lobectomy with post treatment changes in the right infrahilar region and no convincing evidence for active malignancy within the chest, abdomen, or pelvis. DOSE REDUCTION: This exam was performed according to our departmental dose-optimization program which includes automated exposure control, adjustment of the mA and/or kV according to patient size and/or use of iterative reconstruction technique. Electronically signed by: [place] Engel MD 09/27/2023 07:33 PM EDT RP Workstation: [id number]     ASSESSMENT AND PLAN: This is a very pleasant 73 years old white male with a stage Ia non-small cell lung cancer status post right upper lobectomy with lymph node dissection on September 02, 2016. The patient he was on observation since that time. Unfortunately there are some concerning findings with nodular density in the posterior right lower lobe as well as increase and right pleural effusion suspicious for disease recurrence. He underwent ultrasound-guided right thoracentesis and the cytology of the pleural fluid was consistent with recurrent adenocarcinoma. He had molecular studies performed by [facility] 1 that showed positive EGFR mutation with deletion 19. The patient was started on treatment with Tagrisso  80 mg p.o. daily on 11/05/2017.  He status post 71 months of treatment. He recently underwent SBRT to the enlarging right lung nodule under the care of Dr. Jeryl Moris. The patient had repeat CT scan of the chest, abdomen and pelvis performed recently.   I personally and independently reviewed the scan and discussed the result with the patient today.  His scan showed no concerning findings for disease progression.  Recurrent non-small cell lung cancer Recurrent non-small cell lung cancer, adenocarcinoma, initially diagnosed as stage IA in April 2018. Status post right upper lobectomy with lymph node dissection. Evidence of metastatic disease since June 2019. Tumor positive for EGFR exon 19 deletion. On Tagrisso  (osimertinib ) 80 mg PO daily for 71 months. Recent CT scan of chest, abdomen, and pelvis shows well-managed disease with no new metastases. He is tolerating Tagrisso  well with no major issues reported. - Continue Tagrisso  80 mg PO daily - Schedule follow-up in 3 months with blood work  Shortness of breath Increased shortness of breath since last visit 3 months ago. No acute distress.  Rash on face Rash on face. No major concerns expressed.  Nausea Intermittent nausea, not associated with timing of Tagrisso  intake. No major concerns expressed.  Constipation Constipation for about a month. Managing with dietary measures such as prunes. Tagrisso  typically causes diarrhea rather than constipation.  Goals of Care Discussion about medication management and potential side effects. He expressed concern about starting sodium bicarbonate  due to low CO2 levels noted by nephrologist. Advised to consult nephrologist for further guidance. - Advise to contact nephrologist regarding sodium bicarbonate  prescription and CO2 levels   The patient was advised to call immediately if he has any concerning symptoms in the interval. The patient voices understanding of current disease status and treatment options and is in agreement with the current care plan. All questions were answered. The patient knows to call the clinic with any problems, questions or concerns. We can certainly see the patient much sooner if necessary. The total time spent in the  appointment was 30 minutes.  Disclaimer: This note was dictated with voice recognition software. Similar sounding words can inadvertently be transcribed and may not be corrected upon review.

## 2023-10-18 ENCOUNTER — Other Ambulatory Visit: Payer: Self-pay

## 2023-10-18 NOTE — Progress Notes (Signed)
 Specialty Pharmacy Refill Coordination Note  James [name] is a 73 y.o. male contacted today regarding refills of specialty medication(s) Osimertinib  Mesylate (TAGRISSO )   Patient requested [name] at [facility] [place] date: 10/18/23   Medication will be filled on 10/18/23.

## 2023-11-05 ENCOUNTER — Other Ambulatory Visit (HOSPITAL_COMMUNITY): Payer: Self-pay

## 2023-11-16 ENCOUNTER — Other Ambulatory Visit: Payer: Self-pay | Admitting: Pharmacy Technician

## 2023-11-16 ENCOUNTER — Other Ambulatory Visit: Payer: Self-pay

## 2023-11-16 ENCOUNTER — Encounter (INDEPENDENT_AMBULATORY_CARE_PROVIDER_SITE_OTHER): Payer: Self-pay

## 2023-11-16 NOTE — Progress Notes (Signed)
 Specialty Pharmacy Refill Coordination Note  James [name] is a 73 y.o. male contacted today regarding refills of specialty medication(s)   Osimertinib  Mesylate (TAGRISSO )    Patient requested (Patient-Rptd) Pickup at [facility] at [facility] date: (Patient-Rptd) 11/25/23   Medication will be filled on 11/24/23.

## 2023-11-18 ENCOUNTER — Ambulatory Visit: Admitting: Internal Medicine

## 2023-11-18 ENCOUNTER — Ambulatory Visit: Payer: Self-pay | Admitting: *Deleted

## 2023-11-18 VITALS — BP 100/58 | HR 82 | Temp 99.0°F | Ht 67.0 in | Wt 224.2 lb

## 2023-11-18 DIAGNOSIS — R509 Fever, unspecified: Secondary | ICD-10-CM | POA: Diagnosis not present

## 2023-11-18 DIAGNOSIS — K649 Unspecified hemorrhoids: Secondary | ICD-10-CM | POA: Insufficient documentation

## 2023-11-18 DIAGNOSIS — R300 Dysuria: Secondary | ICD-10-CM | POA: Diagnosis not present

## 2023-11-18 LAB — POCT URINALYSIS DIP (CLINITEK)
Bilirubin, UA: NEGATIVE
Blood, UA: NEGATIVE
Glucose, UA: >=1000 mg/dL — AB
Ketones, POC UA: NEGATIVE mg/dL
Leukocytes, UA: NEGATIVE
Nitrite, UA: NEGATIVE
POC PROTEIN,UA: NEGATIVE
Spec Grav, UA: 1.015 (ref 1.010–1.025)
Urobilinogen, UA: 0.2 U/dL
pH, UA: 5.5 (ref 5.0–8.0)

## 2023-11-18 LAB — URINALYSIS
Bilirubin Urine: NEGATIVE
Hgb urine dipstick: NEGATIVE
Ketones, ur: NEGATIVE
Leukocytes,Ua: NEGATIVE
Nitrite: NEGATIVE
Specific Gravity, Urine: 1.02 (ref 1.000–1.030)
Total Protein, Urine: NEGATIVE
Urine Glucose: >=1000 — AB
Urobilinogen, UA: 0.2 (ref 0.0–1.0)
pH: 6 (ref 5.0–8.0)

## 2023-11-18 MED ORDER — CEFUROXIME AXETIL 500 MG PO TABS
500.0000 mg | ORAL_TABLET | Freq: Two times a day (BID) | ORAL | 0 refills | Status: AC
Start: 2023-11-18 — End: 2023-11-28

## 2023-11-18 MED ORDER — TRIAMCINOLONE ACETONIDE 0.1 % EX OINT
1.0000 | TOPICAL_OINTMENT | Freq: Two times a day (BID) | CUTANEOUS | 2 refills | Status: DC
Start: 2023-11-18 — End: 2024-03-30

## 2023-11-18 NOTE — Assessment & Plan Note (Signed)
 UA/Cx Ceftin  po x 10 d Probiotic Recent abd CT - no obstruction

## 2023-11-18 NOTE — Assessment & Plan Note (Signed)
 UTI vs prostatitis (less likely). Recent abd CT report - reviewed Use AZO Ceftin  po x 10 d UA and Cx RTC - Dr Joshua in 2 wks

## 2023-11-18 NOTE — Telephone Encounter (Signed)
 Copied from CRM [id number]. Topic: Clinical - Red Word Triage >> Nov 18, 2023  8:39 AM [place] A wrote: [facility] that prompted transfer to Nurse Triage: Patient has infection has fever, and dizziness and discomfort when urinating. Patients wife on phone Reason for Disposition  Urinating more frequently than usual (i.e., frequency) OR new-onset of the feeling of an urgent need to urinate (i.e., urgency)  Answer Assessment - Initial Assessment Questions 1. SYMPTOM: What's the main symptom you're concerned about? (e.g., frequency, incontinence)     Wife calling in. He is dizzy and having burning with urination and fever.    He takes Jardiance .   2. ONSET: When did the  burning  start?     Tues. Night burning. Fever and dizziness started this morning.   3. PAIN: Is there any pain? If Yes, ask: How bad is it? (Scale: 1-10; mild, moderate, severe)     Yes He has pain in rectum.   He had some blood with BM twice.  Yesterday it happened but not this morning. He has been straining.   No blood in urine.   4. CAUSE: What do you think is causing the symptoms?     UTI   5. OTHER SYMPTOMS: Do you have any other symptoms? (e.g., blood in urine, fever, flank pain, pain with urination)     No blood in urine.   No flank pain.   Yesterday blood in BM x 2 6. PREGNANCY: Is there any chance you are pregnant? When was your last menstrual period?     N/A  Protocols used: Urinary Symptoms-A-AH FYI Only or Action Required?: FYI only for provider.  Patient was last seen in primary care on 03/31/2023 by Joshua Debby CROME, MD.  Called Nurse Triage reporting Dysuria.  Symptoms began several days ago.  Interventions attempted: Rest, hydration, or home remedies.  Symptoms are: gradually worsening.  Triage Disposition: See Physician Within 24 Hours  Patient/caregiver understands and will follow disposition?:  Yes

## 2023-11-18 NOTE — Progress Notes (Signed)
 Subjective:  Patient ID: James [name], male    DOB: [birth year]  Age: 73 y.o. MRN: [id number]  CC: Dysuria (Pt having burning sensation when urinating, and also some blood in his bowel movement ( started Tuesday. Chills, cold and dizziness )   HPI James [name] presents for Dysuria (Pt having burning sensation when urinating, and also some blood in his bowel movement ( started Tuesday. Chills, cold and dizziness ) H/o bleeding hemorrhoids C/o urinary burning, fever T99.1 Chills He is here w/his wife  Outpatient Medications Prior to Visit  Medication Sig Dispense Refill   clindamycin  (CLEOCIN  T) 1 % external solution APPLY TOPICALLY TWICE A DAY 60 mL 0   empagliflozin  (JARDIANCE ) 10 MG TABS tablet Take 1 tablet (10 mg total) by mouth daily before breakfast. 90 tablet 0   Evolocumab  (REPATHA  SURECLICK) 140 MG/ML SOAJ Inject 140 mg into the skin every 14 (fourteen) days. 6 mL 3   ezetimibe  (ZETIA ) 10 MG tablet TAKE 1 TABLET BY MOUTH EVERY DAY 90 tablet 3   furosemide  (LASIX ) 20 MG tablet Take 1 tablet (20 mg total) by mouth daily as needed for edema. (Patient taking differently: Take 20 mg by mouth 3 (three) times a week.) 90 tablet 2   methocarbamol  (ROBAXIN ) 500 MG tablet Take 1 tablet (500 mg total) by mouth every 8 (eight) hours as needed for muscle spasms. 270 tablet 1   metoprolol  tartrate (LOPRESSOR ) 25 MG tablet TAKE 1/2 TABLET TWICE A DAY BY MOUTH 90 tablet 3   Multiple Vitamin (MULTIVITAMIN) tablet Take 1 tablet by mouth daily.     Omega-3 Fatty Acids (FISH OIL) 500 MG CAPS Take 500 mg by mouth daily.      osimertinib  mesylate (TAGRISSO ) 80 MG tablet TAKE 1 TABLET (80 MG TOTAL) BY MOUTH DAILY. 30 tablet 2   rivaroxaban  (XARELTO ) 20 MG TABS tablet Take 1 tablet (20 mg total) by mouth daily with supper. 90 tablet 3   sildenafil  (REVATIO ) 20 MG tablet Take 4 tablets (80 mg total) by mouth daily as needed. 60 tablet 5   No facility-administered medications prior to visit.     ROS: Review of Systems  Constitutional:  Positive for fatigue. Negative for appetite change and unexpected weight change.  HENT:  Negative for congestion, nosebleeds, sneezing, sore throat and trouble swallowing.   Eyes:  Negative for itching and visual disturbance.  Respiratory:  Negative for cough.   Cardiovascular:  Negative for chest pain, palpitations and leg swelling.  Gastrointestinal:  Positive for blood in stool. Negative for abdominal distention, diarrhea and nausea.  Genitourinary:  Positive for dysuria, frequency and urgency. Negative for hematuria.  Musculoskeletal:  Negative for back pain, gait problem, joint swelling and neck pain.  Skin:  Negative for rash.  Neurological:  Negative for dizziness, tremors, speech difficulty and weakness.  Psychiatric/Behavioral:  Negative for agitation, dysphoric mood, sleep disturbance and suicidal ideas. The patient is not nervous/anxious.     Objective:  BP (!) 100/58   Pulse 82   Temp 99 F (37.2 C) (Temporal)   Ht 5' 7 (1.702 m)   Wt 224 lb 4 oz (101.7 kg)   SpO2 99%   BMI 35.12 kg/m   BP Readings from Last 3 Encounters:  11/18/23 (!) 100/58  10/06/23 120/68  08/25/23 136/78    Wt Readings from Last 3 Encounters:  11/18/23 224 lb 4 oz (101.7 kg)  10/06/23 226 lb 6.4 oz (102.7 kg)  08/25/23 228 lb 3.2 oz (103.5 kg)  Physical Exam Constitutional:      General: He is not in acute distress.    Appearance: He is well-developed.     Comments: NAD  Eyes:     Conjunctiva/sclera: Conjunctivae normal.     Pupils: Pupils are equal, round, and reactive to light.  Neck:     Thyroid : No thyromegaly.     Vascular: No JVD.  Cardiovascular:     Rate and Rhythm: Normal rate and regular rhythm.     Heart sounds: Normal heart sounds. No murmur heard.    No friction rub. No gallop.  Pulmonary:     Effort: Pulmonary effort is normal. No respiratory distress.     Breath sounds: Normal breath sounds. No wheezing or rales.   Chest:     Chest wall: No tenderness.  Abdominal:     General: Bowel sounds are normal. There is no distension.     Palpations: Abdomen is soft. There is no mass.     Tenderness: There is no abdominal tenderness. There is no guarding or rebound.  Musculoskeletal:        General: Tenderness present. Normal range of motion.     Cervical back: Normal range of motion.  Lymphadenopathy:     Cervical: No cervical adenopathy.  Skin:    General: Skin is warm and dry.     Findings: No rash.  Neurological:     Mental Status: He is alert and oriented to person, place, and time.     Cranial Nerves: No cranial nerve deficit.     Motor: No abnormal muscle tone.     Coordination: Coordination normal.     Gait: Gait normal.     Deep Tendon Reflexes: Reflexes are normal and symmetric.  Psychiatric:        Behavior: Behavior normal.        Thought Content: Thought content normal.        Judgment: Judgment normal.   Antalgic gait Anus w/large ski tags, no active hemorrhoid Pt declined a digital exam Cane. LS spine w/pain Trace edema  Lab Results  Component Value Date   WBC 6.4 09/27/2023   HGB 15.8 09/27/2023   HCT 46.7 09/27/2023   PLT 136 (L) 09/27/2023   GLUCOSE 114 (H) 09/27/2023   CHOL 121 03/11/2023   TRIG 133 03/11/2023   HDL 56 03/11/2023   LDLDIRECT 177.0 07/24/2015   LDLCALC 42 03/11/2023   ALT 22 09/27/2023   AST 24 09/27/2023   NA 140 09/27/2023   K 4.7 09/27/2023   CL 110 09/27/2023   CREATININE 1.37 (H) 09/27/2023   BUN 23 09/27/2023   CO2 26 09/27/2023   TSH 2.39 03/18/2022   PSA 2.06 03/31/2023   INR 1.28 02/07/2018   HGBA1C 6.3 (H) 03/11/2023   MICROALBUR 0.8 12/26/2019    ECHOCARDIOGRAM COMPLETE Result Date: 10/01/2023    ECHOCARDIOGRAM REPORT   Patient Name:   James [name] Date of Exam: 09/30/2023 Medical Rec #:  [id number]      Height:       67.0 in Accession #:    [id number]     Weight:       228.2 lb Date of Birth:  [birth year]       BSA:          2.139  m Patient Age:    73 years       BP:           120/80 mmHg Patient Gender: M  HR:           80 bpm. Exam Location:  [address] Procedure: 2D Echo, Cardiac Doppler and Color Doppler (Both Spectral and Color            Flow Doppler were utilized during procedure). Indications:    R060.00 Dyspnea  History:        Patient has prior history of Echocardiogram examinations, most                 recent 02/08/2019. CHF, CAD, Prior CABG, right lung cancer. S/p                 right lobectomy, Mitral Valve Disease, Signs/Symptoms:Shortness                 of Breath; Risk Factors:Diabetes and Sleep Apnea. Previous echo                 LVEF 55%.                  Mitral Valve: prior mitral valve repair - complex valvuloplasty                 including artificial Gore-tex neochord placement x6 and 28 mm                 Sorin annuoloflex posterior annuloplasty band valve is present                 in the mitral position. Procedure Date: 03/24/2017.  Sonographer:    Nolon Berg BA, RDCS Referring Phys: [phone] [place]  Sonographer Comments: Technically difficult study due to poor echo windows and suboptimal apical window. Attempt IV x2-unable to gain IV access. IMPRESSIONS  1. Left ventricular ejection fraction, by estimation, is 65 to 70%. The left ventricle has normal function. The left ventricle has no regional wall motion abnormalities. There is mild left ventricular hypertrophy. Left ventricular diastolic parameters are indeterminate.  2. Right ventricular systolic function is normal. The right ventricular size is normal.  3. The mitral valve has been repaired/replaced. Mild mitral valve regurgitation. Moderate mitral stenosis. The mean mitral valve gradient is 9.0 mmHg with average heart rate of 87 bpm. There is a prior mitral valve repair - complex valvuloplasty including artificial Gore-tex neochord placement x6 and 28 mm Sorin annuoloflex posterior annuloplasty band present in the mitral position.  Procedure Date: 03/24/2017.  4. The aortic valve is abnormal. There is severe calcifcation of the aortic valve. Aortic valve regurgitation is trivial. Mild to moderate aortic valve stenosis, with DVI 0.43 and SVI 28. Aortic valve area, by VTI measures 1.35 cm. Aortic valve mean gradient measures 12.3 mmHg. Aortic valve Vmax measures 2.35 m/s. FINDINGS  Left Ventricle: Left ventricular ejection fraction, by estimation, is 65 to 70%. The left ventricle has normal function. The left ventricle has no regional wall motion abnormalities. The left ventricular internal cavity size was normal in size. There is  mild left ventricular hypertrophy. Left ventricular diastolic parameters are indeterminate. Right Ventricle: The right ventricular size is normal. No increase in right ventricular wall thickness. Right ventricular systolic function is normal. Left Atrium: Left atrial size was normal in size. Right Atrium: Right atrial size was normal in size. Pericardium: There is no evidence of pericardial effusion. Mitral Valve: The mitral valve has been repaired/replaced. Mild mitral valve regurgitation. There is a prior mitral valve repair - complex valvuloplasty including artificial Gore-tex neochord placement x6 and 28 mm Sorin annuoloflex posterior annuloplasty  band present in the mitral position. Procedure Date: 03/24/2017. Moderate mitral valve stenosis. MV peak gradient, 13.5 mmHg. The mean mitral valve gradient is 9.0 mmHg with average heart rate of 87 bpm. Tricuspid Valve: The tricuspid valve is normal in structure. Tricuspid valve regurgitation is mild . No evidence of tricuspid stenosis. Aortic Valve: The aortic valve is abnormal. There is severe calcifcation of the aortic valve. Aortic valve regurgitation is trivial. Mild to moderate aortic stenosis is present. Aortic valve mean gradient measures 12.3 mmHg. Aortic valve peak gradient measures 22.0 mmHg. Aortic valve area, by VTI measures 1.35 cm. Pulmonic Valve: The  pulmonic valve was normal in structure. Pulmonic valve regurgitation is not visualized. No evidence of pulmonic stenosis. Aorta: The aortic root is normal in size and structure. Ascending aorta measurements are within normal limits for age when indexed to body surface area. Venous: The inferior vena cava was not well visualized. IAS/Shunts: No atrial level shunt detected by color flow Doppler.  LEFT VENTRICLE PLAX 2D LVIDd:         5.10 cm LVIDs:         3.20 cm LV PW:         1.10 cm LV IVS:        1.00 cm LVOT diam:     2.00 cm LV SV:         60 LV SV Index:   28 LVOT Area:     3.14 cm  LEFT ATRIUM         Index LA diam:    5.00 cm 2.34 cm/m  AORTIC VALVE AV Area (Vmax):    1.49 cm AV Area (Vmean):   1.42 cm AV Area (VTI):     1.35 cm AV Vmax:           234.67 cm/s AV Vmean:          163.000 cm/s AV VTI:            0.448 m AV Peak Grad:      22.0 mmHg AV Mean Grad:      12.3 mmHg LVOT Vmax:         111.00 cm/s LVOT Vmean:        73.900 cm/s LVOT VTI:          0.192 m LVOT/AV VTI ratio: 0.43  AORTA Ao Root diam: 3.60 cm Ao Asc diam:  3.80 cm MITRAL VALVE                TRICUSPID VALVE MV Area (PHT): 1.83 cm     TR Peak grad:   31.8 mmHg MV Area VTI:   1.08 cm     TR Vmax:        282.00 cm/s MV Peak grad:  13.5 mmHg MV Mean grad:  9.0 mmHg     SHUNTS MV Vmax:       1.84 m/s     Systemic VTI:  0.19 m MV Vmean:      144.0 cm/s   Systemic Diam: 2.00 cm MV Decel Time: 416 msec MV E velocity: 152.50 cm/s MV A velocity: 167.00 cm/s MV E/A ratio:  0.91 Soyla Merck MD Electronically signed by Soyla Merck MD Signature Date/Time: 10/01/2023/6:20:18 PM    Final     Assessment & Plan:   Problem List Items Addressed This Visit     Fever   UA/Cx Ceftin  po x 10 d Probiotic Recent abd CT - no obstruction       Relevant  Orders   Urinalysis   CULTURE, URINE COMPREHENSIVE   Dysuria - Primary   UTI vs prostatitis (less likely). Recent abd CT report - reviewed Use AZO Ceftin  po x 10 d UA and Cx RTC -  Dr Joshua in 2 wks      Relevant Orders   POCT URINALYSIS DIP (CLINITEK) (Completed)   Urinalysis   CULTURE, URINE COMPREHENSIVE   Hemorrhoids   Use Triamc oint prn Wet whipes         Meds ordered this encounter  Medications   cefUROXime  (CEFTIN ) 500 MG tablet    Sig: Take 1 tablet (500 mg total) by mouth 2 (two) times daily with a meal for 10 days.    Dispense:  20 tablet    Refill:  0   triamcinolone  ointment (KENALOG ) 0.1 %    Sig: Apply 1 Application topically 2 (two) times daily. Use prn    Dispense:  80 g    Refill:  2      Follow-up: Return for a follow-up visit.  Marolyn Noel, MD

## 2023-11-18 NOTE — Assessment & Plan Note (Signed)
 Use Triamc oint prn Wet whipes

## 2023-11-18 NOTE — Patient Instructions (Signed)
KEFIR 

## 2023-11-19 ENCOUNTER — Ambulatory Visit: Payer: Self-pay | Admitting: Internal Medicine

## 2023-11-20 LAB — CULTURE, URINE COMPREHENSIVE

## 2023-11-24 ENCOUNTER — Other Ambulatory Visit: Payer: Self-pay

## 2023-12-02 ENCOUNTER — Ambulatory Visit: Admitting: Internal Medicine

## 2023-12-02 ENCOUNTER — Ambulatory Visit: Payer: Self-pay | Admitting: Internal Medicine

## 2023-12-02 ENCOUNTER — Other Ambulatory Visit: Payer: Self-pay | Admitting: Internal Medicine

## 2023-12-02 ENCOUNTER — Ambulatory Visit: Payer: Self-pay

## 2023-12-02 ENCOUNTER — Encounter: Payer: Self-pay | Admitting: Internal Medicine

## 2023-12-02 VITALS — BP 128/74 | HR 92 | Temp 98.5°F | Ht 67.0 in | Wt 220.8 lb

## 2023-12-02 DIAGNOSIS — E118 Type 2 diabetes mellitus with unspecified complications: Secondary | ICD-10-CM

## 2023-12-02 DIAGNOSIS — I5032 Chronic diastolic (congestive) heart failure: Secondary | ICD-10-CM

## 2023-12-02 DIAGNOSIS — N138 Other obstructive and reflux uropathy: Secondary | ICD-10-CM

## 2023-12-02 DIAGNOSIS — C3491 Malignant neoplasm of unspecified part of right bronchus or lung: Secondary | ICD-10-CM

## 2023-12-02 DIAGNOSIS — Z7984 Long term (current) use of oral hypoglycemic drugs: Secondary | ICD-10-CM

## 2023-12-02 DIAGNOSIS — D696 Thrombocytopenia, unspecified: Secondary | ICD-10-CM

## 2023-12-02 DIAGNOSIS — E785 Hyperlipidemia, unspecified: Secondary | ICD-10-CM

## 2023-12-02 DIAGNOSIS — N401 Enlarged prostate with lower urinary tract symptoms: Secondary | ICD-10-CM | POA: Diagnosis not present

## 2023-12-02 DIAGNOSIS — K5904 Chronic idiopathic constipation: Secondary | ICD-10-CM | POA: Diagnosis not present

## 2023-12-02 DIAGNOSIS — N410 Acute prostatitis: Secondary | ICD-10-CM | POA: Diagnosis not present

## 2023-12-02 DIAGNOSIS — R972 Elevated prostate specific antigen [PSA]: Secondary | ICD-10-CM

## 2023-12-02 DIAGNOSIS — N1831 Chronic kidney disease, stage 3a: Secondary | ICD-10-CM

## 2023-12-02 LAB — BRAIN NATRIURETIC PEPTIDE: Pro B Natriuretic peptide (BNP): 64 pg/mL (ref 0.0–100.0)

## 2023-12-02 LAB — URINALYSIS, ROUTINE W REFLEX MICROSCOPIC
Bilirubin Urine: NEGATIVE
Ketones, ur: NEGATIVE
Leukocytes,Ua: NEGATIVE
Nitrite: NEGATIVE
Specific Gravity, Urine: 1.015 (ref 1.000–1.030)
Total Protein, Urine: NEGATIVE
Urine Glucose: >=1000 — AB
Urobilinogen, UA: 0.2 (ref 0.0–1.0)
pH: 6 (ref 5.0–8.0)

## 2023-12-02 LAB — HEMOGLOBIN A1C: Hgb A1c MFr Bld: 6.5 % (ref 4.6–6.5)

## 2023-12-02 LAB — MICROALBUMIN / CREATININE URINE RATIO
Creatinine,U: 72 mg/dL
Microalb Creat Ratio: 46.5 mg/g — ABNORMAL HIGH (ref 0.0–30.0)
Microalb, Ur: 3.3 mg/dL — ABNORMAL HIGH (ref 0.0–1.9)

## 2023-12-02 LAB — TSH: TSH: 1.96 u[IU]/mL (ref 0.35–5.50)

## 2023-12-02 LAB — CBC WITH DIFFERENTIAL/PLATELET
Basophils Absolute: 0 K/uL (ref 0.0–0.1)
Basophils Relative: 0.4 % (ref 0.0–3.0)
Eosinophils Absolute: 0.1 K/uL (ref 0.0–0.7)
Eosinophils Relative: 1 % (ref 0.0–5.0)
HCT: 45.9 % (ref 39.0–52.0)
Hemoglobin: 15 g/dL (ref 13.0–17.0)
Lymphocytes Relative: 13.4 % (ref 12.0–46.0)
Lymphs Abs: 1.2 K/uL (ref 0.7–4.0)
MCHC: 32.7 g/dL (ref 30.0–36.0)
MCV: 94.6 fl (ref 78.0–100.0)
Monocytes Absolute: 0.7 K/uL (ref 0.1–1.0)
Monocytes Relative: 8.1 % (ref 3.0–12.0)
Neutro Abs: 6.9 K/uL (ref 1.4–7.7)
Neutrophils Relative %: 77.1 % — ABNORMAL HIGH (ref 43.0–77.0)
Platelets: 204 K/uL (ref 150.0–400.0)
RBC: 4.85 Mil/uL (ref 4.22–5.81)
RDW: 15.5 % (ref 11.5–15.5)
WBC: 8.9 K/uL (ref 4.0–10.5)

## 2023-12-02 LAB — LIPID PANEL
Cholesterol: 113 mg/dL (ref 0–200)
HDL: 44.9 mg/dL (ref >39.00)
LDL Cholesterol: 41 mg/dL (ref 0–99)
NonHDL: 67.68
Total CHOL/HDL Ratio: 3
Triglycerides: 135 mg/dL (ref 0.0–149.0)
VLDL: 27 mg/dL (ref 0.0–40.0)

## 2023-12-02 LAB — VITAMIN B12: Vitamin B-12: 843 pg/mL (ref 211–911)

## 2023-12-02 LAB — FOLATE: Folate: >23.4 ng/mL (ref >5.9)

## 2023-12-02 LAB — MAGNESIUM: Magnesium: 2.4 mg/dL (ref 1.5–2.5)

## 2023-12-02 LAB — PSA: PSA: 12.12 ng/mL — ABNORMAL HIGH (ref 0.10–4.00)

## 2023-12-02 MED ORDER — EMPAGLIFLOZIN 10 MG PO TABS: 10.0000 mg | ORAL_TABLET | Freq: Every day | ORAL | 0 refills | Status: DC

## 2023-12-02 MED ORDER — SULFAMETHOXAZOLE-TRIMETHOPRIM 800-160 MG PO TABS: 1.0000 | ORAL_TABLET | Freq: Two times a day (BID) | ORAL | 0 refills | Status: AC

## 2023-12-02 NOTE — Progress Notes (Unsigned)
 Subjective:  Patient ID: James [name], male    DOB: 1950-06-25  Age: 73 y.o. MRN: [id number]  CC: Diabetes and Congestive Heart Failure   HPI James [name] presents for f/up -------  Discussed the use of AI scribe software for clinical note transcription with the patient, who gave verbal consent to proceed.  History of Present Illness James [name] is a 73 year old male who presents with urinary symptoms.  He has been experiencing pain during urination for the past two weeks, which began after returning from the beach. A test identified E. coli, leading to a course of antibiotics. Despite completing the antibiotics by the previous Sunday, the pain, although reduced, never fully resolved and has recently worsened, particularly at night.  No difficulty urinating, straining, or hematuria. He battles dehydration, which he attributes to his medication regimen, including Lasix  and Jardiance . Constipation is a side effect of Tagrisso , managed with increased fluid intake and prunes. He avoids drinking fluids after 10 PM to prevent nocturnal urination.  During the previous episode, he experienced fever up to 101.57F, which improved over time. No chest pain, shortness of breath, dizziness, or lightheadedness. No pain or swelling in the groin area.  He has a history of hemorrhoids, which have caused occasional bleeding, particularly when constipated. He recalls straining during bowel movements, which he believes led to a burst hemorrhoid and minor bleeding.  He is currently on Lasix , which he takes every other day, but he avoided it during the initial UTI episode to reduce urination frequency. He resumed taking it yesterday but feels behind on his regimen.    Outpatient Medications Prior to Visit  Medication Sig Dispense Refill   clindamycin  (CLEOCIN  T) 1 % external solution APPLY TOPICALLY TWICE A DAY 60 mL 0   empagliflozin  (JARDIANCE ) 10 MG TABS tablet Take 1 tablet (10 mg total) by  mouth daily before breakfast. 90 tablet 0   Evolocumab  (REPATHA  SURECLICK) 140 MG/ML SOAJ Inject 140 mg into the skin every 14 (fourteen) days. 6 mL 3   ezetimibe  (ZETIA ) 10 MG tablet TAKE 1 TABLET BY MOUTH EVERY DAY 90 tablet 3   furosemide  (LASIX ) 20 MG tablet Take 1 tablet (20 mg total) by mouth daily as needed for edema. (Patient taking differently: Take 20 mg by mouth 3 (three) times a week.) 90 tablet 2   methocarbamol  (ROBAXIN ) 500 MG tablet Take 1 tablet (500 mg total) by mouth every 8 (eight) hours as needed for muscle spasms. 270 tablet 1   metoprolol  tartrate (LOPRESSOR ) 25 MG tablet TAKE 1/2 TABLET TWICE A DAY BY MOUTH 90 tablet 3   Multiple Vitamin (MULTIVITAMIN) tablet Take 1 tablet by mouth daily.     Omega-3 Fatty Acids (FISH OIL) 500 MG CAPS Take 500 mg by mouth daily.      osimertinib  mesylate (TAGRISSO ) 80 MG tablet TAKE 1 TABLET (80 MG TOTAL) BY MOUTH DAILY. 30 tablet 2   rivaroxaban  (XARELTO ) 20 MG TABS tablet Take 1 tablet (20 mg total) by mouth daily with supper. 90 tablet 3   sildenafil  (REVATIO ) 20 MG tablet Take 4 tablets (80 mg total) by mouth daily as needed. 60 tablet 5   triamcinolone  ointment (KENALOG ) 0.1 % Apply 1 Application topically 2 (two) times daily. Use prn 80 g 2   No facility-administered medications prior to visit.    ROS Review of Systems  Constitutional:  Negative for appetite change, chills, diaphoresis, fatigue and fever.  HENT: Negative.    Eyes:  Negative.   Respiratory:  Positive for shortness of breath. Negative for cough, chest tightness and wheezing.   Cardiovascular:  Positive for leg swelling. Negative for chest pain and palpitations.  Gastrointestinal:  Positive for anal bleeding and constipation. Negative for abdominal pain, blood in stool, diarrhea, nausea, rectal pain and vomiting.  Endocrine: Negative.   Genitourinary:  Positive for dysuria. Negative for decreased urine volume, difficulty urinating, flank pain, hematuria, penile  swelling, scrotal swelling, testicular pain and urgency.  Musculoskeletal:  Positive for arthralgias and back pain. Negative for myalgias.  Skin: Negative.   Allergic/Immunologic: Negative.   Neurological:  Negative for dizziness, weakness and light-headedness.  Hematological:  Negative for adenopathy. Does not bruise/bleed easily.  Psychiatric/Behavioral: Negative.      Objective:  BP 128/74 (BP Location: Left Arm, Patient Position: Sitting, Cuff Size: Normal)   Pulse 92   Temp 98.5 F (36.9 C) (Oral)   Ht 5' 7 (1.702 m)   Wt 220 lb 12.8 oz (100.2 kg)   SpO2 94%   BMI 34.58 kg/m   BP Readings from Last 3 Encounters:  12/02/23 128/74  11/18/23 (!) 100/58  10/06/23 120/68    Wt Readings from Last 3 Encounters:  12/02/23 220 lb 12.8 oz (100.2 kg)  11/18/23 224 lb 4 oz (101.7 kg)  10/06/23 226 lb 6.4 oz (102.7 kg)    Physical Exam Vitals reviewed.  Constitutional:      General: He is not in acute distress.    Appearance: He is ill-appearing. He is not toxic-appearing or diaphoretic.  HENT:     Nose: Nose normal.     Mouth/Throat:     Mouth: Mucous membranes are moist.  Eyes:     General: No scleral icterus.    Conjunctiva/sclera: Conjunctivae normal.  Cardiovascular:     Rate and Rhythm: Normal rate and regular rhythm.     Heart sounds: Murmur heard.     Systolic murmur is present with a grade of 2/6.     No friction rub. No gallop.     Comments: EKG--- NSR, 81 bpm Anterior infarct pattern is not new [place] Q in III No LVH Unchanged  Pulmonary:     Breath sounds: No stridor. No wheezing, rhonchi or rales.  Abdominal:     General: Abdomen is flat.     Palpations: There is no mass.     Tenderness: There is no abdominal tenderness. There is no guarding.     Hernia: No hernia is present.  Musculoskeletal:     Cervical back: Neck supple.     Right lower leg: 2+ Pitting Edema present.     Left lower leg: 2+ Pitting Edema present.  Skin:    Coloration: Skin is  not jaundiced or pale.  Neurological:     General: No focal deficit present.     Mental Status: He is alert. Mental status is at baseline.  Psychiatric:        Mood and Affect: Mood normal.        Behavior: Behavior normal.     Lab Results  Component Value Date   WBC 8.9 12/02/2023   HGB 15.0 12/02/2023   HCT 45.9 12/02/2023   PLT 204.0 12/02/2023   GLUCOSE 114 (H) 09/27/2023   CHOL 113 12/02/2023   TRIG 135.0 12/02/2023   HDL 44.90 12/02/2023   LDLDIRECT 177.0 07/24/2015   LDLCALC 41 12/02/2023   ALT 22 09/27/2023   AST 24 09/27/2023   NA 140 09/27/2023   K  4.7 09/27/2023   CL 110 09/27/2023   CREATININE 1.37 (H) 09/27/2023   BUN 23 09/27/2023   CO2 26 09/27/2023   TSH 1.96 12/02/2023   PSA 12.12 (H) 12/02/2023   INR 1.28 02/07/2018   HGBA1C 6.5 12/02/2023   MICROALBUR 3.3 (H) 12/02/2023    ECHOCARDIOGRAM COMPLETE Result Date: 10/01/2023    ECHOCARDIOGRAM REPORT   Patient Name:   ORLEN LEEDY Date of Exam: 09/30/2023 Medical Rec #:  [id number]      Height:       67.0 in Accession #:    [id number]     Weight:       228.2 lb Date of Birth:  04-Apr-1951       BSA:          2.139 m Patient Age:    73 years       BP:           120/80 mmHg Patient Gender: M              HR:           80 bpm. Exam Location:  [address] Procedure: 2D Echo, Cardiac Doppler and Color Doppler (Both Spectral and Color            Flow Doppler were utilized during procedure). Indications:    R060.00 Dyspnea  History:        Patient has prior history of Echocardiogram examinations, most                 recent 02/08/2019. CHF, CAD, Prior CABG, right lung cancer. S/p                 right lobectomy, Mitral Valve Disease, Signs/Symptoms:Shortness                 of Breath; Risk Factors:Diabetes and Sleep Apnea. Previous echo                 LVEF 55%.                  Mitral Valve: prior mitral valve repair - complex valvuloplasty                 including artificial Gore-tex neochord placement x6 and 28 mm                  Sorin annuoloflex posterior annuloplasty band valve is present                 in the mitral position. Procedure Date: 03/24/2017.  Sonographer:    Nolon Berg BA, RDCS Referring Phys: [phone] [place]  Sonographer Comments: Technically difficult study due to poor echo windows and suboptimal apical window. Attempt IV x2-unable to gain IV access. IMPRESSIONS  1. Left ventricular ejection fraction, by estimation, is 65 to 70%. The left ventricle has normal function. The left ventricle has no regional wall motion abnormalities. There is mild left ventricular hypertrophy. Left ventricular diastolic parameters are indeterminate.  2. Right ventricular systolic function is normal. The right ventricular size is normal.  3. The mitral valve has been repaired/replaced. Mild mitral valve regurgitation. Moderate mitral stenosis. The mean mitral valve gradient is 9.0 mmHg with average heart rate of 87 bpm. There is a prior mitral valve repair - complex valvuloplasty including artificial Gore-tex neochord placement x6 and 28 mm Sorin annuoloflex posterior annuloplasty band present in the mitral position. Procedure Date: 03/24/2017.  4. The aortic valve is abnormal. There  is severe calcifcation of the aortic valve. Aortic valve regurgitation is trivial. Mild to moderate aortic valve stenosis, with DVI 0.43 and SVI 28. Aortic valve area, by VTI measures 1.35 cm. Aortic valve mean gradient measures 12.3 mmHg. Aortic valve Vmax measures 2.35 m/s. FINDINGS  Left Ventricle: Left ventricular ejection fraction, by estimation, is 65 to 70%. The left ventricle has normal function. The left ventricle has no regional wall motion abnormalities. The left ventricular internal cavity size was normal in size. There is  mild left ventricular hypertrophy. Left ventricular diastolic parameters are indeterminate. Right Ventricle: The right ventricular size is normal. No increase in right ventricular wall thickness. Right ventricular  systolic function is normal. Left Atrium: Left atrial size was normal in size. Right Atrium: Right atrial size was normal in size. Pericardium: There is no evidence of pericardial effusion. Mitral Valve: The mitral valve has been repaired/replaced. Mild mitral valve regurgitation. There is a prior mitral valve repair - complex valvuloplasty including artificial Gore-tex neochord placement x6 and 28 mm Sorin annuoloflex posterior annuloplasty band present in the mitral position. Procedure Date: 03/24/2017. Moderate mitral valve stenosis. MV peak gradient, 13.5 mmHg. The mean mitral valve gradient is 9.0 mmHg with average heart rate of 87 bpm. Tricuspid Valve: The tricuspid valve is normal in structure. Tricuspid valve regurgitation is mild . No evidence of tricuspid stenosis. Aortic Valve: The aortic valve is abnormal. There is severe calcifcation of the aortic valve. Aortic valve regurgitation is trivial. Mild to moderate aortic stenosis is present. Aortic valve mean gradient measures 12.3 mmHg. Aortic valve peak gradient measures 22.0 mmHg. Aortic valve area, by VTI measures 1.35 cm. Pulmonic Valve: The pulmonic valve was normal in structure. Pulmonic valve regurgitation is not visualized. No evidence of pulmonic stenosis. Aorta: The aortic root is normal in size and structure. Ascending aorta measurements are within normal limits for age when indexed to body surface area. Venous: The inferior vena cava was not well visualized. IAS/Shunts: No atrial level shunt detected by color flow Doppler.  LEFT VENTRICLE PLAX 2D LVIDd:         5.10 cm LVIDs:         3.20 cm LV PW:         1.10 cm LV IVS:        1.00 cm LVOT diam:     2.00 cm LV SV:         60 LV SV Index:   28 LVOT Area:     3.14 cm  LEFT ATRIUM         Index LA diam:    5.00 cm 2.34 cm/m  AORTIC VALVE AV Area (Vmax):    1.49 cm AV Area (Vmean):   1.42 cm AV Area (VTI):     1.35 cm AV Vmax:           234.67 cm/s AV Vmean:          163.000 cm/s AV VTI:             0.448 m AV Peak Grad:      22.0 mmHg AV Mean Grad:      12.3 mmHg LVOT Vmax:         111.00 cm/s LVOT Vmean:        73.900 cm/s LVOT VTI:          0.192 m LVOT/AV VTI ratio: 0.43  AORTA Ao Root diam: 3.60 cm Ao Asc diam:  3.80 cm MITRAL VALVE  TRICUSPID VALVE MV Area (PHT): 1.83 cm     TR Peak grad:   31.8 mmHg MV Area VTI:   1.08 cm     TR Vmax:        282.00 cm/s MV Peak grad:  13.5 mmHg MV Mean grad:  9.0 mmHg     SHUNTS MV Vmax:       1.84 m/s     Systemic VTI:  0.19 m MV Vmean:      144.0 cm/s   Systemic Diam: 2.00 cm MV Decel Time: 416 msec MV E velocity: 152.50 cm/s MV A velocity: 167.00 cm/s MV E/A ratio:  0.91 Soyla Merck MD Electronically signed by Soyla Merck MD Signature Date/Time: 10/01/2023/6:20:18 PM    Final     Assessment & Plan:  Type II diabetes mellitus with manifestations (HCC) -     Urinalysis, Routine w reflex microscopic; Future -     Hemoglobin A1c; Future -     Microalbumin / creatinine urine ratio; Future  Hyperlipidemia with target LDL less than 130 -     Lipid panel; Future -     TSH; Future  Thrombocytopenia (HCC) -     Vitamin B12; Future -     CBC with Differential/Platelet; Future -     Folate; Future -     Methylmalonic acid, serum; Future  Acute prostatitis -     CULTURE, URINE COMPREHENSIVE; Future -     PSA; Future -     Sulfamethoxazole -Trimethoprim ; Take 1 tablet by mouth 2 (two) times daily for 21 days.  Dispense: 42 tablet; Refill: 0  Chronic diastolic congestive heart failure (HCC) -     EKG 12-Lead -     Brain natriuretic peptide; Future  Adenocarcinoma of right lung, stage 4 (HCC)  BPH with obstruction/lower urinary tract symptoms -     PSA; Future  Chronic idiopathic constipation -     Magnesium ; Future  Elevated PSA, between 10 and less than 20 ng/ml     Follow-up: Return in about 3 months (around 03/03/2024).  Debby Molt, MD

## 2023-12-02 NOTE — Telephone Encounter (Signed)
 FYI Only or Action Required?: FYI only for provider.  Patient was last seen in primary care on 11/18/2023 by Plotnikov, Karlynn GAILS, MD.  Called Nurse Triage reporting Dysuria; blood in one stool; and recent UTI with antibx treatment, symptoms intensified again days after completing antibx.  Symptoms began several weeks ago.  Interventions attempted: Prescription medications: antibx and Rest, hydration, or home remedies.  Symptoms are: gradually worsening.  Triage Disposition: See Physician Within 4 Hours (or PCP Triage) (overriding See Physician Within 24 Hours)  Patient/caregiver understands and will follow disposition?: Yes      Copied from CRM #[id number]. Topic: Clinical - Red Word Triage >> Dec 02, 2023  8:10 AM Treva T wrote: [facility] that prompted transfer to Nurse Triage: Patient spouse calling, Rilla (DPR verifed), states patient is having burning and pain with urination, and fever. Also states patient was seen about 2 weeks ago with PCP provider, prescribed antibiotic got better then symptoms returned after completion of antibiotics. Reason for Disposition  All other males with painful urination  Answer Assessment - Initial Assessment Questions 1. SEVERITY: How bad is the pain?  (e.g., Scale 1-10; mild, moderate, or severe)     6-7/10, same as in beginning 2. FREQUENCY: How many times have you had painful urination today?      2x 3. PATTERN: Is pain present every time you urinate or just sometimes?      Every time 5. FEVER: Do you have a fever? If Yes, ask: What is your temperature, how was it measured, and when did it start?     No fever right now, no chills 6. PAST UTI: Have you had a urine infection before? If Yes, ask: When was the last time? and What happened that time?      Was just treated for UTI, had slight fever with it 2 weeks ago, prescribed antibx even though tested for UTI and came back negative but sent culture it was e. Coli, started taking  med immediately, fever went to 101.6 maintained that for few days as well as burning, burning did get better, never completely went away, finished med Sunday 10 day course, on Tuesday symptoms went back to where were in beginning, pretty severe burning, fever not come back 7. CAUSE: What do you think is causing the painful urination?      UTI not resolved completely 8. OTHER SYMPTOMS: Do you have any other symptoms? (e.g., flank pain, penis discharge, scrotal pain, blood in urine)     No flank/back pain When poop kind of painful then too in rectal area, started Tuesday also Have seen some blood in stool on Tuesday, assuming from hemorrhoid No penis discharge, no scrotal pain No abdominal pain Tired, no dizziness    Advised pt be examined in next 4 hours for symptoms, scheduled with PCP office, advised call back for worsening or new symptoms.  Protocols used: Urination Pain - Male-A-AH

## 2023-12-02 NOTE — Patient Instructions (Signed)

## 2023-12-03 ENCOUNTER — Telehealth: Payer: Self-pay | Admitting: Internal Medicine

## 2023-12-03 MED ORDER — TAMSULOSIN HCL 0.4 MG PO CAPS: 0.4000 mg | ORAL_CAPSULE | Freq: Every day | ORAL | 0 refills | Status: DC

## 2023-12-03 NOTE — Telephone Encounter (Unsigned)
 Copied from CRM [id number]. Topic: General - Other >> Dec 03, 2023  4:01 PM Gennette ORN wrote: Reason for CRM: [place] from [facility] [phone] drug therapy alert was faxed over she needs a follow up.

## 2023-12-06 LAB — METHYLMALONIC ACID, SERUM: Methylmalonic Acid, Quant: 275 nmol/L (ref 69–390)

## 2023-12-06 LAB — CULTURE, URINE COMPREHENSIVE

## 2023-12-08 NOTE — Telephone Encounter (Signed)
 Advised James [name] That we never received the fax and asked him to refax them. These forms are a recommendation about statin therapy due to the patient having DM, cardiovascular disease or both. They just need a response stating if it's approved or denied from the Provider. If it's being denied they need a reason for the denial.

## 2023-12-14 ENCOUNTER — Other Ambulatory Visit: Payer: Self-pay

## 2023-12-16 ENCOUNTER — Other Ambulatory Visit: Payer: Self-pay

## 2023-12-17 ENCOUNTER — Other Ambulatory Visit: Payer: Self-pay

## 2023-12-20 ENCOUNTER — Other Ambulatory Visit: Payer: Self-pay

## 2023-12-20 ENCOUNTER — Other Ambulatory Visit: Payer: Self-pay | Admitting: Internal Medicine

## 2023-12-20 DIAGNOSIS — C3491 Malignant neoplasm of unspecified part of right bronchus or lung: Secondary | ICD-10-CM

## 2023-12-20 MED ORDER — OSIMERTINIB MESYLATE 80 MG PO TABS
ORAL_TABLET | Freq: Every day | ORAL | 2 refills | Status: DC
  Filled 2023-12-20: qty 30, 30d supply, fill #0
  Filled 2024-01-20: qty 30, 30d supply, fill #1
  Filled 2024-02-24: qty 30, 30d supply, fill #2

## 2023-12-20 NOTE — Progress Notes (Signed)
 Specialty Pharmacy Ongoing Clinical Assessment Note  James [name] is a 73 y.o. male who is being followed by the specialty pharmacy service for RxSp Oncology   Patient's specialty medication(s) reviewed today: Osimertinib  Mesylate (TAGRISSO )   Missed doses in the last 4 weeks: 0   Patient/Caregiver did not have any additional questions or concerns.   Therapeutic benefit summary: Patient is achieving benefit   Adverse events/side effects summary: No adverse events/side effects   Patient's therapy is appropriate to: Continue    Goals Addressed             This Visit's Progress    Stabilization of disease   On track    Patient is on track. Patient will maintain adherence. Per visit on 5/28, recent scans showed no concerning findings for disease progression .         Follow up: 6 months  [facility]

## 2023-12-20 NOTE — Progress Notes (Signed)
 Specialty Pharmacy Refill Coordination Note  James [name] is a 73 y.o. male contacted today regarding refills of specialty medication(s) Osimertinib  Mesylate (TAGRISSO )   Patient requested [name] at [facility] at [place] date: 12/23/23   Medication will be filled on 12/22/23. This fill date is pending response to refill request from provider. Patient is aware and if they have not received fill by intended date they must follow up with pharmacy.

## 2023-12-21 ENCOUNTER — Other Ambulatory Visit: Payer: Self-pay

## 2023-12-24 DIAGNOSIS — G4733 Obstructive sleep apnea (adult) (pediatric): Secondary | ICD-10-CM | POA: Diagnosis not present

## 2024-01-06 ENCOUNTER — Inpatient Hospital Stay (HOSPITAL_BASED_OUTPATIENT_CLINIC_OR_DEPARTMENT_OTHER): Admitting: Internal Medicine

## 2024-01-06 ENCOUNTER — Inpatient Hospital Stay: Attending: Internal Medicine

## 2024-01-06 VITALS — BP 139/80 | HR 82 | Temp 98.0°F | Resp 17 | Ht 67.0 in | Wt 226.6 lb

## 2024-01-06 DIAGNOSIS — N390 Urinary tract infection, site not specified: Secondary | ICD-10-CM | POA: Diagnosis not present

## 2024-01-06 DIAGNOSIS — C3491 Malignant neoplasm of unspecified part of right bronchus or lung: Secondary | ICD-10-CM

## 2024-01-06 DIAGNOSIS — C3490 Malignant neoplasm of unspecified part of unspecified bronchus or lung: Secondary | ICD-10-CM | POA: Diagnosis not present

## 2024-01-06 DIAGNOSIS — C3411 Malignant neoplasm of upper lobe, right bronchus or lung: Secondary | ICD-10-CM | POA: Diagnosis not present

## 2024-01-06 DIAGNOSIS — Z79899 Other long term (current) drug therapy: Secondary | ICD-10-CM | POA: Diagnosis not present

## 2024-01-06 LAB — CMP (CANCER CENTER ONLY)
ALT: 18 U/L (ref 0–44)
AST: 23 U/L (ref 15–41)
Albumin: 4.1 g/dL (ref 3.5–5.0)
Alkaline Phosphatase: 53 U/L (ref 38–126)
Anion gap: 5 (ref 5–15)
BUN: 17 mg/dL (ref 8–23)
CO2: 28 mmol/L (ref 22–32)
Calcium: 9.2 mg/dL (ref 8.9–10.3)
Chloride: 108 mmol/L (ref 98–111)
Creatinine: 1.42 mg/dL — ABNORMAL HIGH (ref 0.61–1.24)
GFR, Estimated: 52 mL/min — ABNORMAL LOW (ref >60)
Glucose, Bld: 118 mg/dL — ABNORMAL HIGH (ref 70–99)
Potassium: 4.5 mmol/L (ref 3.5–5.1)
Sodium: 141 mmol/L (ref 135–145)
Total Bilirubin: 0.6 mg/dL (ref 0.0–1.2)
Total Protein: 6.7 g/dL (ref 6.5–8.1)

## 2024-01-06 LAB — CBC WITH DIFFERENTIAL (CANCER CENTER ONLY)
Abs Immature Granulocytes: 0.02 K/uL (ref 0.00–0.07)
Basophils Absolute: 0 K/uL (ref 0.0–0.1)
Basophils Relative: 1 %
Eosinophils Absolute: 0.2 K/uL (ref 0.0–0.5)
Eosinophils Relative: 2 %
HCT: 46.6 % (ref 39.0–52.0)
Hemoglobin: 15.6 g/dL (ref 13.0–17.0)
Immature Granulocytes: 0 %
Lymphocytes Relative: 19 %
Lymphs Abs: 1.4 K/uL (ref 0.7–4.0)
MCH: 31.4 pg (ref 26.0–34.0)
MCHC: 33.5 g/dL (ref 30.0–36.0)
MCV: 93.8 fL (ref 80.0–100.0)
Monocytes Absolute: 0.9 K/uL (ref 0.1–1.0)
Monocytes Relative: 12 %
Neutro Abs: 4.9 K/uL (ref 1.7–7.7)
Neutrophils Relative %: 66 %
Platelet Count: 137 K/uL — ABNORMAL LOW (ref 150–400)
RBC: 4.97 MIL/uL (ref 4.22–5.81)
RDW: 15.6 % — ABNORMAL HIGH (ref 11.5–15.5)
WBC Count: 7.4 K/uL (ref 4.0–10.5)
nRBC: 0 % (ref 0.0–0.2)

## 2024-01-06 NOTE — Progress Notes (Signed)
 [facility] Telephone:([phone]) [phone]   Fax:([phone]  OFFICE PROGRESS NOTE  Joshua Debby CROME, MD [address] [place] [address]  DIAGNOSIS: Recurrent non-small cell lung cancer, adenocarcinoma initially diagnosed as stage IA (T1c, N0, M0) non-small cell lung cancer, adenocarcinoma presented with right upper lobe lung nodule in April 2018 with recurrence in June 2019.  Biomarker Findings Microsatellite status - MS-Stable Tumor Mutational Burden - TMB-Low (3 Muts/Mb) Genomic Findings For a complete list of the genes assayed, please refer to the Appendix. EGFR exon 19 deletion ([id number]>P) CDKN2A/B loss NKX2-1 amplification 7 Disease relevant genes with no reportable alterations: KRAS, ALK, BRAF, MET, RET, ERBB2, ROS1   PRIOR THERAPY:  1) Status post right upper lobectomy with lymph node dissection under the care of Dr. Kerrin on 09/02/2016. 2) status post palliative radiotherapy to enlarging right upper lobe lung nodule under the care of Dr. Dewey completed on Oct 07, 2021.  CURRENT THERAPY: Tagrisso  80 mg p.o. daily.  First dose started November 05, 2017.  Status post 74  months of treatment.  INTERVAL HISTORY: CYNCERE SONTAG 73 y.o. male returns to the clinic today for his 3 months follow-up visit accompanied by his wife. Discussed the use of AI scribe software for clinical note transcription with the patient, who gave verbal consent to proceed.  History of Present Illness Kishon Garriga is a 73 year old male with recurrent non-small cell lung cancer who presents for follow-up.  He has been on Tagrisso  80 mg daily since June 2019 for his recurrent non-small cell lung cancer, adenocarcinoma with an EGFR mutation. He experiences consistent side effects primarily related to digestion, but no new issues have arisen with the medication. He has been on this treatment for 74 months.  In mid-July, he developed a urinary tract infection. He did not  require hospitalization but visited his primary care doctor twice. Initially, he was prescribed a 10-day course of antibiotics, followed by a 21-day course. He experiences persistent symptoms and is unsure if the infection has resolved. During this period, his PSA was noted to be 12.  His platelets were previously elevated during the infection, according to his primary care doctor.      MEDICAL HISTORY: Past Medical History:  Diagnosis Date   Adenocarcinoma of right lung, stage 1 (HCC) 09/06/2016   Anxiety    Arthritis    knees, hips, back (10/19/2012)   Cataract 2018   Chronic diastolic congestive heart failure (HCC)    Chronic kidney disease 2021   Chronic lower back pain    Colon polyps    10/27/2002, repeat letter 09/17/2007   Coronary artery disease    Coronary artery disease involving native coronary artery of native heart without angina pectoris    Depressive disorder, not elsewhere classified    no meds   Diabetes mellitus without complication (HCC)    diet controlled- no med  (while in hosp 4/18 -elevated cbg   Dyspnea    Fasting hyperglycemia    GERD (gastroesophageal reflux disease)    Heart murmur    Hemoptysis    abnormal CT Chest 01/29/10 - ? new GG changes RUL > not viz on plain cxr 02/26/2010   Hypertension    Mitral regurgitation    severe MR 08/2016   MPN (myeloproliferative neoplasm) (HCC)    1st detected 06/04/1998   Obesity    OSA on CPAP    last sleep study 10 years ago  Other and unspecified hyperlipidemia    Peripheral vascular disease (HCC) 08/2016   after lung surgey small clots in lungs,after hip dvt-5/16   Pneumonia    4/18   Positive PPD 1965   non reactive in 2012 (10/19/2012)   Routine general medical examination at a health care facility    S/P CABG x 1 03/24/2017   LIMA to LAD   S/P mitral valve repair 03/24/2017   Complex valvuloplasty including artificial Gore-tex neochord placement x6 and 28 mm Sorin Annuloflex posterior annuloplasty  band   Sleep apnea 2005   Special screening for malignant neoplasm of prostate    Spinal stenosis, unspecified region other than cervical    Wrist pain, left     ALLERGIES:  is allergic to symbicort  [budesonide -formoterol  fumarate] and amoxicillin.  MEDICATIONS:  Current Outpatient Medications  Medication Sig Dispense Refill   clindamycin  (CLEOCIN  T) 1 % external solution APPLY TOPICALLY TWICE A DAY 60 mL 0   empagliflozin  (JARDIANCE ) 10 MG TABS tablet Take 1 tablet (10 mg total) by mouth daily before breakfast. 90 tablet 0   Evolocumab  (REPATHA  SURECLICK) 140 MG/ML SOAJ Inject 140 mg into the skin every 14 (fourteen) days. 6 mL 3   ezetimibe  (ZETIA ) 10 MG tablet TAKE 1 TABLET BY MOUTH EVERY DAY 90 tablet 3   furosemide  (LASIX ) 20 MG tablet Take 1 tablet (20 mg total) by mouth daily as needed for edema. (Patient taking differently: Take 20 mg by mouth 3 (three) times a week.) 90 tablet 2   methocarbamol  (ROBAXIN ) 500 MG tablet Take 1 tablet (500 mg total) by mouth every 8 (eight) hours as needed for muscle spasms. 270 tablet 1   metoprolol  tartrate (LOPRESSOR ) 25 MG tablet TAKE 1/2 TABLET TWICE A DAY BY MOUTH 90 tablet 3   Multiple Vitamin (MULTIVITAMIN) tablet Take 1 tablet by mouth daily.     Omega-3 Fatty Acids (FISH OIL) 500 MG CAPS Take 500 mg by mouth daily.      osimertinib  mesylate (TAGRISSO ) 80 MG tablet TAKE 1 TABLET (80 MG TOTAL) BY MOUTH DAILY. 30 tablet 2   rivaroxaban  (XARELTO ) 20 MG TABS tablet Take 1 tablet (20 mg total) by mouth daily with supper. 90 tablet 3   sildenafil  (REVATIO ) 20 MG tablet Take 4 tablets (80 mg total) by mouth daily as needed. 60 tablet 5   tamsulosin  (FLOMAX ) 0.4 MG CAPS capsule Take 1 capsule (0.4 mg total) by mouth daily. 90 capsule 0   triamcinolone  ointment (KENALOG ) 0.1 % Apply 1 Application topically 2 (two) times daily. Use prn 80 g 2   No current facility-administered medications for this visit.    SURGICAL HISTORY:  Past Surgical  History:  Procedure Laterality Date   ANTERIOR CRUCIATE LIGAMENT REPAIR Left 1967   CARDIAC CATHETERIZATION  2000   CARDIAC VALVE REPLACEMENT  03/24/2017   Mitral Repair   CHEST TUBE INSERTION Right 10/19/2012   post bronch   COLONOSCOPY W/ POLYPECTOMY     CORONARY ARTERY BYPASS GRAFT N/A 03/24/2017   Procedure: CORONARY ARTERY BYPASS GRAFTING (CABG)x1 using left internal mammary artery, LIMA-LAD;  Surgeon: Dusty Sudie DEL, MD;  Location: [facility] OR;  Service: Open Heart Surgery;  Laterality: N/A;   CORONARY PRESSURE/FFR STUDY N/A 08/11/2016   Procedure: Intravascular Pressure Wire/FFR Study;  Surgeon: Peter M Swaziland, MD;  Location: [facility] INVASIVE CV LAB;  Service: Cardiovascular;  Laterality: N/A;   FLEXIBLE BRONCHOSCOPY  10/19/2012   Flexible video fiberoptic bronchoscopy with electromagnetic navigation and biopsies.   IR  ANGIOGRAM PULMONARY BILATERAL SELECTIVE  02/06/2018   IR ANGIOGRAM SELECTIVE EACH ADDITIONAL VESSEL  02/06/2018   IR ANGIOGRAM SELECTIVE EACH ADDITIONAL VESSEL  02/06/2018   IR INFUSION THROMBOL ARTERIAL INITIAL (MS)  02/06/2018   IR INFUSION THROMBOL ARTERIAL INITIAL (MS)  02/06/2018   IR THROMB F/U EVAL ART/VEN FINAL DAY (MS)  02/07/2018   IR US  GUIDE VASC ACCESS RIGHT  02/06/2018   JOINT REPLACEMENT  4 /25/2016   hip   LOBECTOMY Right 09/02/2016   Procedure: RIGHT UPPER LOBECTOMY;  Surgeon: Elspeth JAYSON Millers, MD;  Location: [facility] OR;  Service: Thoracic;  Laterality: Right;   LYMPH NODE DISSECTION Right 09/02/2016   Procedure: LYMPH NODE DISSECTION, RIGHT LUNG;  Surgeon: Elspeth JAYSON Millers, MD;  Location: [facility] OR;  Service: Thoracic;  Laterality: Right;   MITRAL VALVE REPAIR N/A 03/24/2017   Procedure: MITRAL VALVE REPAIR (MVR) with Sorin Carbomedics Annuloflex size 28;  Surgeon: Dusty Sudie DEL, MD;  Location: [facility] OR;  Service: Open Heart Surgery;  Laterality: N/A;   RIGHT/LEFT HEART CATH AND CORONARY ANGIOGRAPHY N/A 08/11/2016   Procedure: Right/Left Heart Cath and  Coronary Angiography;  Surgeon: Peter M Swaziland, MD;  Location: [facility] INVASIVE CV LAB;  Service: Cardiovascular;  Laterality: N/A;   TEE WITHOUT CARDIOVERSION N/A 07/17/2016   Procedure: TRANSESOPHAGEAL ECHOCARDIOGRAM (TEE);  Surgeon: Vinie JAYSON Maxcy, MD;  Location: [facility] ENDOSCOPY;  Service: Cardiovascular;  Laterality: N/A;   TEE WITHOUT CARDIOVERSION N/A 03/24/2017   Procedure: TRANSESOPHAGEAL ECHOCARDIOGRAM (TEE);  Surgeon: Dusty Sudie DEL, MD;  Location: [facility] OR;  Service: Open Heart Surgery;  Laterality: N/A;   TONSILLECTOMY  1950's   TOTAL HIP ARTHROPLASTY Left 10/05/2014   dr hiram   TOTAL HIP ARTHROPLASTY Left 10/05/2014   Procedure: LEFT TOTAL HIP ARTHROPLASTY ANTERIOR APPROACH;  Surgeon: Dempsey Moan, MD;  Location: [facility] OR;  Service: Orthopedics;  Laterality: Left;   VIDEO ASSISTED THORACOSCOPY (VATS)/WEDGE RESECTION Right 09/02/2016   Procedure: VIDEO ASSISTED THORACOSCOPY (VATS)/RIGHT UPPER LOBE WEDGE RESECTION;  Surgeon: Elspeth JAYSON Millers, MD;  Location: [facility] OR;  Service: Thoracic;  Laterality: Right;   VIDEO BRONCHOSCOPY WITH ENDOBRONCHIAL NAVIGATION N/A 10/19/2012   Procedure: VIDEO BRONCHOSCOPY WITH ENDOBRONCHIAL NAVIGATION;  Surgeon: Lamar GORMAN Chris, MD;  Location: [facility] OR;  Service: Thoracic;  Laterality: N/A;   WRIST RECONSTRUCTION Left 12/2009   'proximal row carpectomy [name]    REVIEW OF SYSTEMS:  A comprehensive review of systems was negative except for: Constitutional: positive for fatigue Musculoskeletal: positive for back pain   PHYSICAL EXAMINATION: General appearance: alert, cooperative, fatigued, and no distress Head: Normocephalic, without obvious abnormality, atraumatic Neck: no adenopathy, no JVD, supple, symmetrical, trachea midline, and thyroid  not enlarged, symmetric, no tenderness/mass/nodules Lymph nodes: Cervical, supraclavicular, and axillary nodes normal. Resp: clear to auscultation bilaterally Back: symmetric, no curvature. ROM normal. No CVA  tenderness. Cardio: regular rate and rhythm, S1, S2 normal, no murmur, click, rub or gallop GI: soft, non-tender; bowel sounds normal; no masses,  no organomegaly Extremities: extremities normal, atraumatic, no cyanosis or edema  ECOG PERFORMANCE STATUS: 1 - Symptomatic but completely ambulatory  Blood pressure 139/80, pulse 82, temperature 98 F (36.7 C), resp. rate 17, height 5' 7 (1.702 m), weight 226 lb 9.6 oz (102.8 kg), SpO2 97%.  LABORATORY DATA: Lab Results  Component Value Date   WBC 7.4 01/06/2024   HGB 15.6 01/06/2024   HCT 46.6 01/06/2024   MCV 93.8 01/06/2024   PLT 137 (L) 01/06/2024      Chemistry      Component  Value Date/Time   NA 141 01/06/2024 1051   NA 142 10/01/2016 1534   K 4.5 01/06/2024 1051   K 4.2 10/01/2016 1534   CL 108 01/06/2024 1051   CO2 28 01/06/2024 1051   CO2 28 10/01/2016 1534   BUN 17 01/06/2024 1051   BUN 15.9 10/01/2016 1534   CREATININE 1.42 (H) 01/06/2024 1051   CREATININE 1.10 02/08/2017 1101   CREATININE 1.5 (H) 10/01/2016 1534      Component Value Date/Time   CALCIUM  9.2 01/06/2024 1051   CALCIUM  9.6 10/01/2016 1534   ALKPHOS 53 01/06/2024 1051   ALKPHOS 65 10/01/2016 1534   AST 23 01/06/2024 1051   AST 20 10/01/2016 1534   ALT 18 01/06/2024 1051   ALT 24 10/01/2016 1534   BILITOT 0.6 01/06/2024 1051   BILITOT 0.46 10/01/2016 1534       RADIOGRAPHIC STUDIES: No results found.    ASSESSMENT AND PLAN: This is a very pleasant 73 years old white male with a stage Ia non-small cell lung cancer status post right upper lobectomy with lymph node dissection on September 02, 2016. The patient he was on observation since that time. Unfortunately there are some concerning findings with nodular density in the posterior right lower lobe as well as increase and right pleural effusion suspicious for disease recurrence. He underwent ultrasound-guided right thoracentesis and the cytology of the pleural fluid was consistent with  recurrent adenocarcinoma. He had molecular studies performed by [facility] 1 that showed positive EGFR mutation with deletion 19. The patient was started on treatment with Tagrisso  80 mg p.o. daily on 11/05/2017.  He status post 74 months of treatment. He recently underwent SBRT to the enlarging right lung nodule under the care of Dr. Dewey. He continues to tolerate his treatment with Tagrisso  fairly well. Assessment and Plan Assessment & Plan Recurrent non-small cell lung cancer with EGFR mutation on Tagrisso  therapy Recurrent non-small cell lung cancer, adenocarcinoma type, with EGFR mutation (exon 19 deletion). On Tagrisso  (osimertinib ) 80 mg daily since June 2019, totaling 74 months of treatment. No new issues reported with Tagrisso , and current side effects are consistent with previous experiences. Blood work shows well-managed white blood cells, hemoglobin, and platelets, with slight decrease attributed to Tagrisso  and recent antibiotics. Kidney function remains well-managed with serum creatinine at 1.42. - Continue Tagrisso  80 mg daily - Schedule follow-up in 3 months with a scan  Recent urinary tract infection, possibly unresolved Recent urinary tract infection in mid-July treated with two courses of antibiotics (10 days and 21 days). Symptoms suggest possible unresolved infection. Primary care physician noted elevated PSA of 12, suggesting potential underlying issues. - Recommend consultation with a urologist for further evaluation  Possible prostatitis under evaluation Possible prostatitis indicated by symptoms and elevated PSA. Primary care physician has managed initial treatment, but further evaluation by a urologist is advised due to persistent symptoms and elevated PSA. - Recommend consultation with a urologist for further evaluation He was advised to call immediately if he has any concerning symptom in the interval  The patient voices understanding of current disease status and  treatment options and is in agreement with the current care plan. All questions were answered. The patient knows to call the clinic with any problems, questions or concerns. We can certainly see the patient much sooner if necessary. The total time spent in the appointment was 30 minutes.  Disclaimer: This note was dictated with voice recognition software. Similar sounding words can inadvertently be transcribed and may not  be corrected upon review.

## 2024-01-20 ENCOUNTER — Other Ambulatory Visit: Payer: Self-pay

## 2024-01-20 NOTE — Progress Notes (Signed)
 Specialty Pharmacy Refill Coordination Note  James [name] is a 73 y.o. male contacted today regarding refills of specialty medication(s) Osimertinib  Mesylate (TAGRISSO )   Patient requested [name] at [facility] [place] date: 01/31/24   Medication will be filled on 09.19.25

## 2024-01-24 NOTE — Progress Notes (Signed)
 d   Cardiology Office Note    Date:  01/31/2024   ID:  JAIVIAN [name], DOB December 22, 1950, MRN [id number]  PCP:  Joshua Debby CROME, MD  Cardiologist:  Dr. [place]   Chief Complaint  Patient presents with   Coronary Artery Disease   s/p mitral valve repair    History of Present Illness:  James [name] is a 73 y.o. male with PMH of DM II, HLD, and severe MR. he was originally noted to have a loud murmur by his primary care physician.  Initial echocardiogram suggested mild MR, however it also suggested partially flail leaflet.  MR was thought to be underestimated.  TEE showed a flail P3 segment with ruptured cord and a severe MR.  Cardiac catheterization performed on 08/11/2016 showed 65% mid to distal LAD lesion, FFR borderline at 0.81, EF 65%.  Normal cardiac output and RV/LV filling pressure.  He had a PET scan on 08/17/2016 that showed a hypermetabolic solid 2.6 cm anterior right upper lobe pulmonary nodule compatible with prior bronchogenic, no hypermetabolic thoracic lymphadenopathy or distal metastatic disease.  There were nonspecific and mild asymmetric hypermetabolism in the left pontine tonsil and the left tongue base region.  CT surgery recommended lobectomy for his lung mass with possible CABG with LIMA to LAD and MV repair.  He underwent right upper lobe lobectomy. Pathology positive for adenocarcinoma stage Ia.  Serial CT scan was recommended by oncology.  Post procedure, he became very short of breath.  CTA was positive for a small PE, he was started on Xarelto .  Repeat echocardiogram obtained on 02/15/2017 showed EF 60-65%, persistent MR.  He eventually underwent mitral valve repair with Sorin Carbomedics Annuloglex size 28, LIMA to LAD by Dr. Dusty. He was DC on coumadin  instead of Xarelto . Completed 6 months of anticoagulation for provoked PE and then it was stopped.   In August 20-19 CT showed a right pleural effusion. Thoracentesis was positive for recurrent CA. He was started on Tagrisso  by Dr.  Emery. He was admitted on February 06, 2018 with submassive PE. He was treated with  EKOS with bilateral intra-arterial TPA, patient completed 6 days of IV heparin . The original plan was to transition to Xarelto  after 5 days of IV heparin .  However he developed left flank pain, hematuria and right leg painful swelling.  Hence IV heparin  was continued.  Both Urology and OIrthopedics evaluated and cleared for transitioning to Xarelto .  Xarelto  was started on 02/12/2018.  Hematuria decreased  Right leg pain also improved. MRI did show evidence of hematoma that was improving. He had chronic DVT in right peroneal vein.  Since patient was started on Xarelto , aspirin  discontinued to minimize bleeding risk.   In April 2023 he had a new nodule noted on Chest CT and had RT for this area.  Sees Dr Tobie now for CKD. He was started on Jardiance  and initially noted significant improvement in edema. He notes he is taking lasix  only 2-4 times a month. Weight is up 4 lbs. Activity limited by back pain. Legs swell occasionally. Notes more SOB an fatigue. Did have a lot of wheezing associated with URI a few weeks ago.   Repeat Echo in May showed normal LV function. There was moderate mitral stenosis and mild to moderate aortic stenosis.   On follow up today he notes his SOB is worse than before. He  has only been taking lasix  PRN - about twice a week. Weight fluctuates. No cough. He is quite sedentary. Has  a lot of back and hip pain. No chest pain.    Past Medical History:  Diagnosis Date   Adenocarcinoma of right lung, stage 1 (HCC) 09/06/2016   Anxiety    Arthritis    knees, hips, back (10/19/2012)   Cataract 2018   Chronic diastolic congestive heart failure (HCC)    Chronic kidney disease 2021   Chronic lower back pain    Colon polyps    10/27/2002, repeat letter 09/17/2007   Coronary artery disease    Coronary artery disease involving native coronary artery of native heart without angina pectoris     Depressive disorder, not elsewhere classified    no meds   Diabetes mellitus without complication (HCC)    diet controlled- no med  (while in hosp 4/18 -elevated cbg   Dyspnea    Fasting hyperglycemia    GERD (gastroesophageal reflux disease)    Heart murmur    Hemoptysis    abnormal CT Chest 01/29/10 - ? new GG changes RUL > not viz on plain cxr 02/26/2010   Hypertension    Mitral regurgitation    severe MR 08/2016   MPN (myeloproliferative neoplasm) (HCC)    1st detected 06/04/1998   Obesity    OSA on CPAP    last sleep study 10 years ago   Other and unspecified hyperlipidemia    Peripheral vascular disease 08/2016   after lung surgey small clots in lungs,after hip dvt-5/16   Pneumonia    4/18   Positive PPD 1965   non reactive in 2012 (10/19/2012)   Routine general medical examination at a health care facility    S/P CABG x 1 03/24/2017   LIMA to LAD   S/P mitral valve repair 03/24/2017   Complex valvuloplasty including artificial Gore-tex neochord placement x6 and 28 mm Sorin Annuloflex posterior annuloplasty band   Sleep apnea 2005   Special screening for malignant neoplasm of prostate    Spinal stenosis, unspecified region other than cervical    Wrist pain, left     Past Surgical History:  Procedure Laterality Date   ANTERIOR CRUCIATE LIGAMENT REPAIR Left 1967   CARDIAC CATHETERIZATION  2000   CARDIAC VALVE REPLACEMENT  03/24/2017   Mitral Repair   CHEST TUBE INSERTION Right 10/19/2012   post bronch   COLONOSCOPY W/ POLYPECTOMY     CORONARY ARTERY BYPASS GRAFT N/A 03/24/2017   Procedure: CORONARY ARTERY BYPASS GRAFTING (CABG)x1 using left internal mammary artery, LIMA-LAD;  Surgeon: Dusty Sudie DEL, MD;  Location: [facility] OR;  Service: Open Heart Surgery;  Laterality: N/A;   CORONARY PRESSURE/FFR STUDY N/A 08/11/2016   Procedure: Intravascular Pressure Wire/FFR Study;  Surgeon: Janiel Derhammer M [place], MD;  Location: [facility] INVASIVE CV LAB;  Service: Cardiovascular;  Laterality:  N/A;   FLEXIBLE BRONCHOSCOPY  10/19/2012   Flexible video fiberoptic bronchoscopy with electromagnetic navigation and biopsies.   IR ANGIOGRAM PULMONARY BILATERAL SELECTIVE  02/06/2018   IR ANGIOGRAM SELECTIVE EACH ADDITIONAL VESSEL  02/06/2018   IR ANGIOGRAM SELECTIVE EACH ADDITIONAL VESSEL  02/06/2018   IR INFUSION THROMBOL ARTERIAL INITIAL (MS)  02/06/2018   IR INFUSION THROMBOL ARTERIAL INITIAL (MS)  02/06/2018   IR THROMB F/U EVAL ART/VEN FINAL DAY (MS)  02/07/2018   IR US  GUIDE VASC ACCESS RIGHT  02/06/2018   JOINT REPLACEMENT  4 /25/2016   hip   LOBECTOMY Right 09/02/2016   Procedure: RIGHT UPPER LOBECTOMY;  Surgeon: Elspeth JAYSON Millers, MD;  Location: [facility] OR;  Service: Thoracic;  Laterality: Right;  LYMPH NODE DISSECTION Right 09/02/2016   Procedure: LYMPH NODE DISSECTION, RIGHT LUNG;  Surgeon: Elspeth JAYSON Millers, MD;  Location: [facility] OR;  Service: Thoracic;  Laterality: Right;   MITRAL VALVE REPAIR N/A 03/24/2017   Procedure: MITRAL VALVE REPAIR (MVR) with Sorin Carbomedics Annuloflex size 28;  Surgeon: Dusty Sudie DEL, MD;  Location: [facility] OR;  Service: Open Heart Surgery;  Laterality: N/A;   RIGHT/LEFT HEART CATH AND CORONARY ANGIOGRAPHY N/A 08/11/2016   Procedure: Right/Left Heart Cath and Coronary Angiography;  Surgeon: Telia Amundson M [place], MD;  Location: [facility] INVASIVE CV LAB;  Service: Cardiovascular;  Laterality: N/A;   TEE WITHOUT CARDIOVERSION N/A 07/17/2016   Procedure: TRANSESOPHAGEAL ECHOCARDIOGRAM (TEE);  Surgeon: Vinie JAYSON Maxcy, MD;  Location: [facility] ENDOSCOPY;  Service: Cardiovascular;  Laterality: N/A;   TEE WITHOUT CARDIOVERSION N/A 03/24/2017   Procedure: TRANSESOPHAGEAL ECHOCARDIOGRAM (TEE);  Surgeon: Dusty Sudie DEL, MD;  Location: [facility] OR;  Service: Open Heart Surgery;  Laterality: N/A;   TONSILLECTOMY  1950's   TOTAL HIP ARTHROPLASTY Left 10/05/2014   dr hiram   TOTAL HIP ARTHROPLASTY Left 10/05/2014   Procedure: LEFT TOTAL HIP ARTHROPLASTY ANTERIOR APPROACH;  Surgeon:  Dempsey Moan, MD;  Location: [facility] OR;  Service: Orthopedics;  Laterality: Left;   VIDEO ASSISTED THORACOSCOPY (VATS)/WEDGE RESECTION Right 09/02/2016   Procedure: VIDEO ASSISTED THORACOSCOPY (VATS)/RIGHT UPPER LOBE WEDGE RESECTION;  Surgeon: Elspeth JAYSON Millers, MD;  Location: [facility] OR;  Service: Thoracic;  Laterality: Right;   VIDEO BRONCHOSCOPY WITH ENDOBRONCHIAL NAVIGATION N/A 10/19/2012   Procedure: VIDEO BRONCHOSCOPY WITH ENDOBRONCHIAL NAVIGATION;  Surgeon: Lamar GORMAN Chris, MD;  Location: [facility] OR;  Service: Thoracic;  Laterality: N/A;   WRIST RECONSTRUCTION Left 12/2009   'proximal row carpectomy [name]    Current Medications: Outpatient Medications Prior to Visit  Medication Sig Dispense Refill   clindamycin  (CLEOCIN  T) 1 % external solution APPLY TOPICALLY TWICE A DAY 60 mL 0   empagliflozin  (JARDIANCE ) 10 MG TABS tablet Take 1 tablet (10 mg total) by mouth daily before breakfast. 90 tablet 0   Evolocumab  (REPATHA  SURECLICK) 140 MG/ML SOAJ Inject 140 mg into the skin every 14 (fourteen) days. 6 mL 3   ezetimibe  (ZETIA ) 10 MG tablet TAKE 1 TABLET BY MOUTH EVERY DAY 90 tablet 3   furosemide  (LASIX ) 20 MG tablet Take 1 tablet (20 mg total) by mouth daily as needed for edema. 90 tablet 2   Multiple Vitamin (MULTIVITAMIN) tablet Take 1 tablet by mouth daily.     Omega-3 Fatty Acids (FISH OIL) 500 MG CAPS Take 500 mg by mouth daily.      osimertinib  mesylate (TAGRISSO ) 80 MG tablet TAKE 1 TABLET (80 MG TOTAL) BY MOUTH DAILY. 30 tablet 2   rivaroxaban  (XARELTO ) 20 MG TABS tablet Take 1 tablet (20 mg total) by mouth daily with supper. 90 tablet 3   tamsulosin  (FLOMAX ) 0.4 MG CAPS capsule Take 1 capsule (0.4 mg total) by mouth daily. 90 capsule 0   triamcinolone  ointment (KENALOG ) 0.1 % Apply 1 Application topically 2 (two) times daily. Use prn 80 g 2   metoprolol  tartrate (LOPRESSOR ) 25 MG tablet TAKE 1/2 TABLET TWICE A DAY BY MOUTH 90 tablet 3   methocarbamol  (ROBAXIN ) 500 MG tablet Take 1 tablet  (500 mg total) by mouth every 8 (eight) hours as needed for muscle spasms. (Patient not taking: Reported on 01/31/2024) 270 tablet 1   sildenafil  (REVATIO ) 20 MG tablet Take 4 tablets (80 mg total) by mouth daily as needed. (Patient not taking: Reported on 01/31/2024)  60 tablet 5   No facility-administered medications prior to visit.     Allergies:   Symbicort  [budesonide -formoterol  fumarate] and Amoxicillin   Social History   Socioeconomic History   Marital status: Married    Spouse name: Twyla   Number of children: 3   Years of education: Not on file   Highest education level: Bachelor's degree (e.g., BA, AB, BS)  Occupational History   Occupation: Retired, Multimedia programmer   Occupation: Retired, Real estate    Comment: Slow  Tobacco Use   Smoking status: Never   Smokeless tobacco: Never  Vaping Use   Vaping status: Never Used  Substance and Sexual Activity   Alcohol use: Yes    Alcohol/week: 1.0 standard drink of alcohol    Types: 1 Cans of beer per week    Comment: time to time   Drug use: No   Sexual activity: Never    Partners: Female  Other Topics Concern   Not on file  Social History Narrative   HSG, [employer]. Married '73.  2 sons - '74,   '76;  1 daughter -  '77  6 grandchildren.Work - Teacher, music now retired. ACP - not fully discussed.       Lives with wife-2025   Social Drivers of Health   Financial Resource Strain: Low Risk  (11/18/2023)   Overall Financial Resource Strain (CARDIA)    Difficulty of Paying Living Expenses: Not hard at all  Food Insecurity: No Food Insecurity (11/18/2023)   Hunger Vital Sign    Worried About Running Out of Food in the Last Year: Never true    Ran Out of Food in the Last Year: Never true  Transportation Needs: No Transportation Needs (11/18/2023)   PRAPARE - Administrator, Civil Service (Medical): No    Lack of Transportation (Non-Medical): No  Physical Activity: Inactive  (11/18/2023)   Exercise Vital Sign    Days of Exercise per Week: 0 days    Minutes of Exercise per Session: Not on file  Stress: Stress Concern Present (11/18/2023)   [employer] of Occupational Health - Occupational Stress Questionnaire    Feeling of Stress: To some extent  Social Connections: Moderately Isolated (11/18/2023)   Social Connection and Isolation Panel    Frequency of Communication with Friends and Family: Three times a week    Frequency of Social Gatherings with Friends and Family: Three times a week    Attends Religious Services: Patient declined    Active Member of Clubs or Organizations: No    Attends Engineer, structural: Not on file    Marital Status: Married     Family History:  The patient's family history includes Cancer in his sister and sister; Diabetes in his son and son; Heart attack in his father; Heart disease in his father, paternal uncle, and sister; Heart failure in his mother; Hyperlipidemia in his mother.   ROS:   Please see the history of present illness.    ROS All other systems reviewed and are negative.   PHYSICAL EXAM:   VS:  BP 114/68 (BP Location: Left Arm, Patient Position: Sitting, Cuff Size: Large)   Pulse 88   Ht 5' 7 (1.702 m)   Wt 228 lb 3.2 oz (103.5 kg)   SpO2 93%   BMI 35.74 kg/m    GENERAL:  Well appearing WM in NAD HEENT:  PERRL, EOMI, sclera are clear. Oropharynx is clear. NECK:  No jugular venous  distention, carotid upstroke brisk and symmetric, no bruits, no thyromegaly or adenopathy LUNGS:  Clear to auscultation bilaterally CHEST:  Unremarkable HEART:  RRR,  PMI not displaced or sustained,S1 and S2 within normal limits, no S3, no S4: no clicks, no rubs, gr 2/6 systolic murmur RUSB ABD:  Soft, nontender. BS +, no masses or bruits. No hepatomegaly, no splenomegaly EXT:  2 + pulses throughout, 1-2+ pitting ankle edema, chronic stasis skin changes.  SKIN:  Warm and dry.  No rashes NEURO:  Alert and oriented x 3.  Cranial nerves II through XII intact. PSYCH:  Cognitively intact      Wt Readings from Last 3 Encounters:  01/31/24 228 lb 3.2 oz (103.5 kg)  01/06/24 226 lb 9.6 oz (102.8 kg)  12/02/23 220 lb 12.8 oz (100.2 kg)      Studies/Labs Reviewed:         Recent Labs: 12/02/2023: Magnesium  2.4; Pro B Natriuretic peptide (BNP) 64.0; TSH 1.96 01/06/2024: ALT 18; BUN 17; Creatinine 1.42; Hemoglobin 15.6; Platelet Count 137; Potassium 4.5; Sodium 141   Lipid Panel    Component Value Date/Time   CHOL 113 12/02/2023 1123   CHOL 121 03/11/2023 1404   TRIG 135.0 12/02/2023 1123   TRIG 108 05/20/2006 0825   HDL 44.90 12/02/2023 1123   HDL 56 03/11/2023 1404   CHOLHDL 3 12/02/2023 1123   VLDL 27.0 12/02/2023 1123   LDLCALC 41 12/02/2023 1123   LDLCALC 42 03/11/2023 1404   LDLCALC 136 (H) 12/26/2019 1150   LDLDIRECT 177.0 07/24/2015 1052    Additional studies/ records that were reviewed today include:    Cath 08/11/2016 Conclusion     Prox LAD to Mid LAD lesion, 30 %stenosed. Mid LAD to Dist LAD lesion, 65 %stenosed. Prox RCA to Mid RCA lesion, 15 %stenosed. The left ventricular systolic function is normal. LV end diastolic pressure is normal. The left ventricular ejection fraction is 55-65% by visual estimate. LV end diastolic pressure is normal.   1. Borderline single vessel obstructive CAD involving the mid LAD. FFR 0.81 2. Normal LV function EF 65% 3. Normal right heart and LV filling pressures 4. Normal Cardiac output   Plan: will refer to CT surgery for consideration of MV repair. The stenosis in the LAD will be discussed. He is asymptomatic and I would favor treating it medically. If he develops symptoms in the future it could be treated with PCI.          CABG with MVR 03/24/2017 Preoperative Diagnosis:       Severe Mitral Regurgitation Single-vessel Coronary Artery Disease   Postoperative Diagnosis:    Same   Procedure:        Mitral Valve Repair              Complex valvuloplasty including artificial Gore-tex neochord placement x6             Sorin Carbomedics Annuloflex posterior annuloplasty band (size 28mm, catalog #[id number], serial #[id number])   Coronary Artery Bypass Grafting x 1              Left Internal Mammary Artery to Distal Left Anterior Descending Coronary Artery      Echo 01/29/19: IMPRESSIONS     1. Left ventricular ejection fraction, by visual estimation, is 50 to  55%. The left ventricle has normal function. Normal left ventricular size.  Left ventricular septal wall thickness was mildly increased. Mildly  increased left ventricular posterior  wall thickness. There is mildly increased left ventricular  hypertrophy.   2. Elevated left ventricular end-diastolic pressure.   3. Left ventricular diastolic Doppler parameters are consistent with  pseudonormalization pattern of LV diastolic filling.   4. Global right ventricle has normal systolic function.The right  ventricular size is normal. No increase in right ventricular wall  thickness.   5. Left atrial size was normal.   6. Right atrial size was normal.   7. The mitral valve is normal in structure. Mild mitral valve  regurgitation. Mild mitral stenosis.   8. The tricuspid valve is normal in structure. Tricuspid valve  regurgitation is mild.   9. The aortic valve is normal in structure. Aortic valve regurgitation  was not visualized by color flow Doppler. Structurally normal aortic  valve, with no evidence of sclerosis or stenosis.  10. Mild thickening and calcification of the aortic valve leaflets.  11. The pulmonic valve was normal in structure. Pulmonic valve  regurgitation is trivial by color flow Doppler.  12. Normal pulmonary artery systolic pressure.  13. The inferior vena cava is normal in size with greater than 50%  respiratory variability, suggesting right atrial pressure of 3 mmHg.   Echo 09/30/23: IMPRESSIONS     1. Left ventricular ejection fraction,  by estimation, is 65 to 70%. The  left ventricle has normal function. The left ventricle has no regional  wall motion abnormalities. There is mild left ventricular hypertrophy.  Left ventricular diastolic parameters  are indeterminate.   2. Right ventricular systolic function is normal. The right ventricular  size is normal.   3. The mitral valve has been repaired/replaced. Mild mitral valve  regurgitation. Moderate mitral stenosis. The mean mitral valve gradient is  9.0 mmHg with average heart rate of 87 bpm. There is a prior mitral valve  repair - complex valvuloplasty  including artificial Gore-tex neochord placement x6 and 28 mm Sorin  annuoloflex posterior annuloplasty band present in the mitral position.  Procedure Date: 03/24/2017.   4. The aortic valve is abnormal. There is severe calcifcation of the  aortic valve. Aortic valve regurgitation is trivial. Mild to moderate  aortic valve stenosis, with DVI 0.43 and SVI 28. Aortic valve area, by VTI  measures 1.35 cm. Aortic valve mean  gradient measures 12.3 mmHg. Aortic valve Vmax measures 2.35 m/s.   ASSESSMENT:    1. S/P mitral valve repair   2. Nonrheumatic mitral valve stenosis   3. Chronic diastolic congestive heart failure (HCC)   4. Adenocarcinoma of right lung, stage 4 (HCC)   5. Recurrent pulmonary embolism (HCC)   6. Coronary artery disease involving native coronary artery of native heart without angina pectoris   7. Stage 3a chronic kidney disease (HCC)          PLAN:  In order of problems listed above:  Severe MR s/p mitral valve annuloplasty/repair:  F/U Echo 2020  shows mild MR but moderaet mitral stenosis. Recommend routine SBE prophylaxis. Given MS I would like to slow his HR to improve diastolic filling. Will switch metoprolol  tartrate to Succinate and increase dose to 50 mg daily. Would like to see resting HR around 60. Will also increase lasix  to 20 mg daily. Follow up in 2 months.  2.   History of PE  now recurrent on 02/06/18. Submassive with RV strain. S/p EKOS and lytic therapy. On Xarelto  now.  Will need anticoagulation indefinitely.   3.    Chronic diastolic heart failure: Has  edema on exam.  Continue  Jardiance .  He does have venous insuffieciency.  Recommend he take lasix  daily.  Sodium restriction. Support hose.  4.    S/p CABG x 1: LIMA to LAD. No angina.   5.    Stage IV lung CA. S/p resection with recurrent malignant pleural effusion. On oral chemotherapy.  S/p RT for recurrent lung nodule. Stable. Due for follow up CT in May stable with no new disease.   6.   DM 2: Managed by primary care provider- now on Jardiance .  7.   HLD off statin due to history of elevated CK on Lipitor. On Zetia . Now on Repatha . Last LDL excellent. 41.   8. CKD stage 3a. Followed by Nephrology. Last creatinine 1.45  9. SOB multifactorial. Related to MS, diastolic CHF, prior lung resection and PE, deconditioning. If symptoms persist despite optimizing his heart therapy may need to consider [facility]  Follow up in 2 months.  Medication Adjustments/Labs and Tests Ordered: Current medicines are reviewed at length with the patient today.  Concerns regarding medicines are outlined above.  Medication changes, Labs and Tests ordered today are listed in the Patient Instructions below. There are no Patient Instructions on file for this visit.   Signed, Jonluke Cobbins Sw[place], MD  01/31/2024 1:59 PM    [facility] [address] [place], [place], [place]  [address] Phone: [phone]; Fax: [phone]

## 2024-01-28 ENCOUNTER — Other Ambulatory Visit: Payer: Self-pay

## 2024-01-31 ENCOUNTER — Ambulatory Visit: Attending: Cardiology | Admitting: Cardiology

## 2024-01-31 ENCOUNTER — Encounter: Payer: Self-pay | Admitting: Cardiology

## 2024-01-31 VITALS — BP 114/68 | HR 88 | Ht 67.0 in | Wt 228.2 lb

## 2024-01-31 DIAGNOSIS — Z9889 Other specified postprocedural states: Secondary | ICD-10-CM

## 2024-01-31 DIAGNOSIS — I2699 Other pulmonary embolism without acute cor pulmonale: Secondary | ICD-10-CM | POA: Diagnosis not present

## 2024-01-31 DIAGNOSIS — C3491 Malignant neoplasm of unspecified part of right bronchus or lung: Secondary | ICD-10-CM

## 2024-01-31 DIAGNOSIS — I5032 Chronic diastolic (congestive) heart failure: Secondary | ICD-10-CM | POA: Diagnosis not present

## 2024-01-31 DIAGNOSIS — N1831 Chronic kidney disease, stage 3a: Secondary | ICD-10-CM | POA: Diagnosis not present

## 2024-01-31 DIAGNOSIS — I2510 Atherosclerotic heart disease of native coronary artery without angina pectoris: Secondary | ICD-10-CM | POA: Diagnosis not present

## 2024-01-31 DIAGNOSIS — I342 Nonrheumatic mitral (valve) stenosis: Secondary | ICD-10-CM

## 2024-01-31 MED ORDER — METOPROLOL SUCCINATE ER 50 MG PO TB24: 50.0000 mg | ORAL_TABLET | Freq: Every day | ORAL | 3 refills | Status: DC

## 2024-01-31 NOTE — Patient Instructions (Addendum)
 Medication Instructions:  Take Toprol  XL 50 mg daily Take Lasix  20 mg daily Continue all other medications *If you need a refill on your cardiac medications before your next appointment, please call your pharmacy*  Lab Work: None ordered  Testing/Procedures: None ordered  Follow-Up: At [facility], you and your health needs are our priority.  As part of our continuing mission to provide you with exceptional heart care, our providers are all part of one team.  This team includes your primary Cardiologist (physician) and Advanced Practice Providers or APPs (Physician Assistants and Nurse Practitioners) who all work together to provide you with the care you need, when you need it.  Your next appointment:  2 months    Provider:  Dr.Jordan   We recommend signing up for the patient portal called MyChart.  Sign up information is provided on this After Visit Summary.  MyChart is used to connect with patients for Virtual Visits (Telemedicine).  Patients are able to view lab/test results, encounter notes, upcoming appointments, etc.  Non-urgent messages can be sent to your provider as well.   To learn more about what you can do with MyChart, go to [email or web address].

## 2024-02-02 ENCOUNTER — Telehealth: Payer: Self-pay | Admitting: Radiology

## 2024-02-02 ENCOUNTER — Encounter: Payer: Self-pay | Admitting: Internal Medicine

## 2024-02-02 NOTE — Telephone Encounter (Signed)
 Copied from CRM #[id number]. Topic: Appointments - Scheduling Inquiry for Clinic >> Feb 02, 2024  1:05 PM James [name] wrote: Reason for CRM: pt received a message on my chart from dr. Joshua to schedule an apt to  to recheck the PSA and prostate gland infection. However the earliest available was 10/22, pt was scheduled for 10/22 & put on the waiting list  however , pt wants to know If dr. Joshua want him to wait that long to be seen if not can he get a earlier day or keep this day that he is scheduled. . Pt callback # is [phone]

## 2024-02-03 ENCOUNTER — Other Ambulatory Visit: Payer: Self-pay

## 2024-02-03 NOTE — Progress Notes (Signed)
 Clinical Intervention Note  Clinical Intervention Notes: Patient is switching from metoprolol  tartate to succinate and is asking if this will interact with his Tagrisso . Advised this will not interact. He is also questioning whether doubling his total daily dose from 25mg  to 50mg  may drop his heart rate too low. Advised that his new medication will give a more consistent heartrate throughout the day. He is still concerned so he plans to reach out to cardiologist for further advice.   Clinical Intervention Outcomes: Prevention of an adverse drug event   James [name] Specialty Pharmacist

## 2024-02-04 ENCOUNTER — Telehealth: Payer: Self-pay | Admitting: Radiology

## 2024-02-04 NOTE — Telephone Encounter (Signed)
 Unable to reach patient. LMTRC. Patient does NOT need a office visit he just needs to come in and have labs drawn.

## 2024-02-04 NOTE — Telephone Encounter (Signed)
 Copied from CRM [id number]. Topic: Clinical - Request for Lab/Test Order >> Feb 04, 2024  3:11 PM Berneda FALCON wrote: Reason for CRM: Patient would like to add a urinalysis to his labs on Tuesday if possible please to recheck for his UTI

## 2024-02-07 ENCOUNTER — Other Ambulatory Visit: Payer: Self-pay | Admitting: Internal Medicine

## 2024-02-07 DIAGNOSIS — R972 Elevated prostate specific antigen [PSA]: Secondary | ICD-10-CM

## 2024-02-07 DIAGNOSIS — N410 Acute prostatitis: Secondary | ICD-10-CM

## 2024-02-07 NOTE — Telephone Encounter (Signed)
 Labs ordered.

## 2024-02-07 NOTE — Telephone Encounter (Signed)
 Is this okay?

## 2024-02-08 ENCOUNTER — Other Ambulatory Visit

## 2024-02-08 ENCOUNTER — Ambulatory Visit: Payer: Self-pay | Admitting: Internal Medicine

## 2024-02-08 DIAGNOSIS — N410 Acute prostatitis: Secondary | ICD-10-CM | POA: Diagnosis not present

## 2024-02-08 DIAGNOSIS — R972 Elevated prostate specific antigen [PSA]: Secondary | ICD-10-CM

## 2024-02-08 LAB — URINALYSIS, ROUTINE W REFLEX MICROSCOPIC
Bilirubin Urine: NEGATIVE
Hgb urine dipstick: NEGATIVE
Ketones, ur: NEGATIVE
Leukocytes,Ua: NEGATIVE
Nitrite: NEGATIVE
RBC / HPF: NONE SEEN (ref 0–?)
Specific Gravity, Urine: 1.02 (ref 1.000–1.030)
Total Protein, Urine: NEGATIVE
Urine Glucose: >=1000 — AB
Urobilinogen, UA: 0.2 (ref 0.0–1.0)
pH: 5.5 (ref 5.0–8.0)

## 2024-02-08 LAB — PSA: PSA: 3.39 ng/mL (ref 0.10–4.00)

## 2024-02-10 LAB — CULTURE, URINE COMPREHENSIVE: RESULT:: NO GROWTH

## 2024-02-10 NOTE — Telephone Encounter (Signed)
Labs has been done

## 2024-02-11 NOTE — Telephone Encounter (Signed)
Labs has been done

## 2024-02-24 ENCOUNTER — Encounter (INDEPENDENT_AMBULATORY_CARE_PROVIDER_SITE_OTHER): Payer: Self-pay

## 2024-02-24 ENCOUNTER — Other Ambulatory Visit: Payer: Self-pay

## 2024-02-24 NOTE — Progress Notes (Signed)
 Specialty Pharmacy Refill Coordination Note  James [name] is a 73 y.o. male contacted today regarding refills of specialty medication(s) Osimertinib  Mesylate (TAGRISSO )   Patient requested (Patient-Rptd) Pickup at [facility] at [facility] date: (Patient-Rptd) 02/28/24   Medication will be filled on 02/25/24.

## 2024-02-25 ENCOUNTER — Other Ambulatory Visit: Payer: Self-pay

## 2024-02-27 ENCOUNTER — Other Ambulatory Visit: Payer: Self-pay | Admitting: Internal Medicine

## 2024-02-27 DIAGNOSIS — I5032 Chronic diastolic (congestive) heart failure: Secondary | ICD-10-CM

## 2024-02-27 DIAGNOSIS — N1831 Chronic kidney disease, stage 3a: Secondary | ICD-10-CM

## 2024-02-27 DIAGNOSIS — E118 Type 2 diabetes mellitus with unspecified complications: Secondary | ICD-10-CM

## 2024-02-28 ENCOUNTER — Other Ambulatory Visit: Payer: Self-pay | Admitting: Internal Medicine

## 2024-02-28 DIAGNOSIS — N401 Enlarged prostate with lower urinary tract symptoms: Secondary | ICD-10-CM

## 2024-03-01 ENCOUNTER — Ambulatory Visit: Admitting: Internal Medicine

## 2024-03-13 ENCOUNTER — Ambulatory Visit: Admitting: Cardiology

## 2024-03-15 ENCOUNTER — Telehealth: Payer: Self-pay

## 2024-03-15 NOTE — Telephone Encounter (Signed)
 Copied from CRM [id number]. Topic: Clinical - Medication Question >> Mar 15, 2024  2:07 PM Dedra B wrote: Reason for CRM: Brittany, Pharmacy Tech from [place], called to see if drug therapy alert form was received. Form was last faxed on 03/10/24. Brittany can be reached at [phone]

## 2024-03-15 NOTE — Telephone Encounter (Signed)
 Spoke with [employer] advised them we did not receive it. They are refaxing.

## 2024-03-23 ENCOUNTER — Other Ambulatory Visit: Payer: Self-pay | Admitting: Internal Medicine

## 2024-03-23 ENCOUNTER — Other Ambulatory Visit: Payer: Self-pay

## 2024-03-23 DIAGNOSIS — C3491 Malignant neoplasm of unspecified part of right bronchus or lung: Secondary | ICD-10-CM

## 2024-03-23 MED ORDER — OSIMERTINIB MESYLATE 80 MG PO TABS
ORAL_TABLET | Freq: Every day | ORAL | 2 refills | Status: DC
  Filled 2024-03-23: qty 30, fill #0
  Filled 2024-03-27: qty 30, 30d supply, fill #0
  Filled 2024-04-21 (×2): qty 30, 30d supply, fill #1
  Filled 2024-05-18 – 2024-05-19 (×2): qty 30, 30d supply, fill #2

## 2024-03-24 DIAGNOSIS — Z1211 Encounter for screening for malignant neoplasm of colon: Secondary | ICD-10-CM | POA: Diagnosis not present

## 2024-03-24 NOTE — Progress Notes (Signed)
 d   Cardiology Office Note    Date:  03/30/2024   ID:  James [name], DOB [birth year], MRN [id number]  PCP:  Joshua Debby CROME, MD  Cardiologist:  Dr. Anabelle Bungert   Chief Complaint  Patient presents with   Congestive Heart Failure    History of Present Illness:  James [name] is a 73 y.o. male with PMH of DM II, HLD, and severe MR. he was originally noted to have a loud murmur by his primary care physician.  Initial echocardiogram suggested mild MR, however it also suggested partially flail leaflet.  MR was thought to be underestimated.  TEE showed a flail P3 segment with ruptured cord and a severe MR.  Cardiac catheterization performed on 08/11/2016 showed 65% mid to distal LAD lesion, FFR borderline at 0.81, EF 65%.  Normal cardiac output and RV/LV filling pressure.  He had a PET scan on 08/17/2016 that showed a hypermetabolic solid 2.6 cm anterior right upper lobe pulmonary nodule compatible with prior bronchogenic, no hypermetabolic thoracic lymphadenopathy or distal metastatic disease.  There were nonspecific and mild asymmetric hypermetabolism in the left pontine tonsil and the left tongue base region.  CT surgery recommended lobectomy for his lung mass with possible CABG with LIMA to LAD and MV repair.  He underwent right upper lobe lobectomy. Pathology positive for adenocarcinoma stage Ia.  Serial CT scan was recommended by oncology.  Post procedure, he became very short of breath.  CTA was positive for a small PE, he was started on Xarelto .  Repeat echocardiogram obtained on 02/15/2017 showed EF 60-65%, persistent MR.  He eventually underwent mitral valve repair with Sorin Carbomedics Annuloglex size 28, LIMA to LAD by Dr. Dusty. He was DC on coumadin  instead of Xarelto . Completed 6 months of anticoagulation for provoked PE and then it was stopped.   In August 20-19 CT showed a right pleural effusion. Thoracentesis was positive for recurrent CA. He was started on Tagrisso  by Dr. Emery. He was  admitted on February 06, 2018 with submassive PE. He was treated with  EKOS with bilateral intra-arterial TPA, patient completed 6 days of IV heparin . The original plan was to transition to Xarelto  after 5 days of IV heparin .  However he developed left flank pain, hematuria and right leg painful swelling.  Hence IV heparin  was continued.  Both Urology and OIrthopedics evaluated and cleared for transitioning to Xarelto .  Xarelto  was started on 02/12/2018.  Hematuria decreased  Right leg pain also improved. MRI did show evidence of hematoma that was improving. He had chronic DVT in right peroneal vein.  Since patient was started on Xarelto , aspirin  discontinued to minimize bleeding risk.   In April 2023 he had a new nodule noted on Chest CT and had RT for this area.  Sees Dr Tobie now for CKD. He was started on Jardiance  and initially noted significant improvement in edema. He notes he is taking lasix  only 2-4 times a month. Weight is up 4 lbs. Activity limited by back pain. Legs swell occasionally. Notes more SOB an fatigue. Did have a lot of wheezing associated with URI a few weeks ago.   Repeat Echo in May showed normal LV function. There was moderate mitral stenosis and mild to moderate aortic stenosis.   On his last visit he noted increased SOB and had more edema. We increased his lasix  to 20 mg daily and switched his metoprolol  to succinate 50 mg daily. Since then breathing is about the same, some improvement in edema.  Notes cough.     Past Medical History:  Diagnosis Date   Adenocarcinoma of right lung, stage 1 (HCC) 09/06/2016   Anxiety    Arthritis    knees, hips, back (10/19/2012)   Cataract 2018   Chronic diastolic congestive heart failure (HCC)    Chronic kidney disease 2021   Chronic lower back pain    Colon polyps    10/27/2002, repeat letter 09/17/2007   Coronary artery disease    Coronary artery disease involving native coronary artery of native heart without angina pectoris     Depressive disorder, not elsewhere classified    no meds   Diabetes mellitus without complication (HCC)    diet controlled- no med  (while in hosp 4/18 -elevated cbg   Dyspnea    Fasting hyperglycemia    GERD (gastroesophageal reflux disease)    Heart murmur    Hemoptysis    abnormal CT Chest 01/29/10 - ? new GG changes RUL > not viz on plain cxr 02/26/2010   Hypertension    Mitral regurgitation    severe MR 08/2016   MPN (myeloproliferative neoplasm) (HCC)    1st detected 06/04/1998   Obesity    OSA on CPAP    last sleep study 10 years ago   Other and unspecified hyperlipidemia    Peripheral vascular disease 08/2016   after lung surgey small clots in lungs,after hip dvt-5/16   Pneumonia    4/18   Positive PPD 1965   non reactive in 2012 (10/19/2012)   Routine general medical examination at a health care facility    S/P CABG x 1 03/24/2017   LIMA to LAD   S/P mitral valve repair 03/24/2017   Complex valvuloplasty including artificial Gore-tex neochord placement x6 and 28 mm Sorin Annuloflex posterior annuloplasty band   Sleep apnea 2005   Special screening for malignant neoplasm of prostate    Spinal stenosis, unspecified region other than cervical    Wrist pain, left     Past Surgical History:  Procedure Laterality Date   ANTERIOR CRUCIATE LIGAMENT REPAIR Left 1967   CARDIAC CATHETERIZATION  2000   CARDIAC VALVE REPLACEMENT  03/24/2017   Mitral Repair   CHEST TUBE INSERTION Right 10/19/2012   post bronch   COLONOSCOPY W/ POLYPECTOMY     CORONARY ARTERY BYPASS GRAFT N/A 03/24/2017   Procedure: CORONARY ARTERY BYPASS GRAFTING (CABG)x1 using left internal mammary artery, LIMA-LAD;  Surgeon: Dusty Sudie DEL, MD;  Location: [facility] OR;  Service: Open Heart Surgery;  Laterality: N/A;   CORONARY PRESSURE/FFR STUDY N/A 08/11/2016   Procedure: Intravascular Pressure Wire/FFR Study;  Surgeon: Keymani Mclean M Zakia Sainato, MD;  Location: [facility] INVASIVE CV LAB;  Service: Cardiovascular;  Laterality:  N/A;   FLEXIBLE BRONCHOSCOPY  10/19/2012   Flexible video fiberoptic bronchoscopy with electromagnetic navigation and biopsies.   IR ANGIOGRAM PULMONARY BILATERAL SELECTIVE  02/06/2018   IR ANGIOGRAM SELECTIVE EACH ADDITIONAL VESSEL  02/06/2018   IR ANGIOGRAM SELECTIVE EACH ADDITIONAL VESSEL  02/06/2018   IR INFUSION THROMBOL ARTERIAL INITIAL (MS)  02/06/2018   IR INFUSION THROMBOL ARTERIAL INITIAL (MS)  02/06/2018   IR THROMB F/U EVAL ART/VEN FINAL DAY (MS)  02/07/2018   IR US  GUIDE VASC ACCESS RIGHT  02/06/2018   JOINT REPLACEMENT  4 /25/2016   hip   LOBECTOMY Right 09/02/2016   Procedure: RIGHT UPPER LOBECTOMY;  Surgeon: Elspeth JAYSON Millers, MD;  Location: [facility] OR;  Service: Thoracic;  Laterality: Right;   LYMPH NODE DISSECTION Right 09/02/2016  Procedure: LYMPH NODE DISSECTION, RIGHT LUNG;  Surgeon: Elspeth JAYSON Millers, MD;  Location: [facility] OR;  Service: Thoracic;  Laterality: Right;   MITRAL VALVE REPAIR N/A 03/24/2017   Procedure: MITRAL VALVE REPAIR (MVR) with Sorin Carbomedics Annuloflex size 28;  Surgeon: Dusty Sudie DEL, MD;  Location: [facility] OR;  Service: Open Heart Surgery;  Laterality: N/A;   RIGHT/LEFT HEART CATH AND CORONARY ANGIOGRAPHY N/A 08/11/2016   Procedure: Right/Left Heart Cath and Coronary Angiography;  Surgeon: Taelynn Mcelhannon M Jacyln Carmer, MD;  Location: [facility] INVASIVE CV LAB;  Service: Cardiovascular;  Laterality: N/A;   TEE WITHOUT CARDIOVERSION N/A 07/17/2016   Procedure: TRANSESOPHAGEAL ECHOCARDIOGRAM (TEE);  Surgeon: Vinie JAYSON Maxcy, MD;  Location: [facility] ENDOSCOPY;  Service: Cardiovascular;  Laterality: N/A;   TEE WITHOUT CARDIOVERSION N/A 03/24/2017   Procedure: TRANSESOPHAGEAL ECHOCARDIOGRAM (TEE);  Surgeon: Dusty Sudie DEL, MD;  Location: [facility] OR;  Service: Open Heart Surgery;  Laterality: N/A;   TONSILLECTOMY  1950's   TOTAL HIP ARTHROPLASTY Left 10/05/2014   dr hiram   TOTAL HIP ARTHROPLASTY Left 10/05/2014   Procedure: LEFT TOTAL HIP ARTHROPLASTY ANTERIOR APPROACH;  Surgeon:  Dempsey Moan, MD;  Location: [facility] OR;  Service: Orthopedics;  Laterality: Left;   VIDEO ASSISTED THORACOSCOPY (VATS)/WEDGE RESECTION Right 09/02/2016   Procedure: VIDEO ASSISTED THORACOSCOPY (VATS)/RIGHT UPPER LOBE WEDGE RESECTION;  Surgeon: Elspeth JAYSON Millers, MD;  Location: [facility] OR;  Service: Thoracic;  Laterality: Right;   VIDEO BRONCHOSCOPY WITH ENDOBRONCHIAL NAVIGATION N/A 10/19/2012   Procedure: VIDEO BRONCHOSCOPY WITH ENDOBRONCHIAL NAVIGATION;  Surgeon: Lamar GORMAN Chris, MD;  Location: [facility] OR;  Service: Thoracic;  Laterality: N/A;   WRIST RECONSTRUCTION Left 12/2009   'proximal row carpectomy [name]    Current Medications: Outpatient Medications Prior to Visit  Medication Sig Dispense Refill   clindamycin  (CLEOCIN  T) 1 % external solution APPLY TOPICALLY TWICE A DAY 60 mL 0   Evolocumab  (REPATHA  SURECLICK) 140 MG/ML SOAJ Inject 140 mg into the skin every 14 (fourteen) days. 6 mL 3   ezetimibe  (ZETIA ) 10 MG tablet TAKE 1 TABLET BY MOUTH EVERY DAY 90 tablet 3   JARDIANCE  10 MG TABS tablet TAKE 1 TABLET BY MOUTH DAILY BEFORE BREAKFAST. 90 tablet 0   Multiple Vitamin (MULTIVITAMIN) tablet Take 1 tablet by mouth daily.     Omega-3 Fatty Acids (FISH OIL) 500 MG CAPS Take 500 mg by mouth daily.      osimertinib  mesylate (TAGRISSO ) 80 MG tablet TAKE 1 TABLET (80 MG TOTAL) BY MOUTH DAILY. 30 tablet 2   rivaroxaban  (XARELTO ) 20 MG TABS tablet Take 1 tablet (20 mg total) by mouth daily with supper. 90 tablet 3   furosemide  (LASIX ) 20 MG tablet Take 1 tablet (20 mg total) by mouth daily as needed for edema. 90 tablet 2   metoprolol  succinate (TOPROL -XL) 50 MG 24 hr tablet Take 1 tablet (50 mg total) by mouth daily. Take with or immediately following a meal. 90 tablet 3   tamsulosin  (FLOMAX ) 0.4 MG CAPS capsule TAKE 1 CAPSULE BY MOUTH EVERY DAY 90 capsule 0   triamcinolone  ointment (KENALOG ) 0.1 % Apply 1 Application topically 2 (two) times daily. Use prn 80 g 2   No facility-administered medications  prior to visit.     Allergies:   Symbicort  [budesonide -formoterol  fumarate] and Amoxicillin   Social History   Socioeconomic History   Marital status: Married    Spouse name: Twyla   Number of children: 3   Years of education: Not on file   Highest education level: Bachelor's degree (  e.g., BA, AB, BS)  Occupational History   Occupation: Retired, Multimedia Programmer   Occupation: Retired, Real estate    Comment: Slow  Tobacco Use   Smoking status: Never   Smokeless tobacco: Never  Vaping Use   Vaping status: Never Used  Substance and Sexual Activity   Alcohol use: Yes    Alcohol/week: 1.0 standard drink of alcohol    Types: 1 Cans of beer per week    Comment: time to time   Drug use: No   Sexual activity: Never    Partners: Female  Other Topics Concern   Not on file  Social History Narrative   HSG, [employer]. Married '73.  2 sons - '74,   '76;  1 daughter -  '77  6 grandchildren.Work - teacher, music now retired. ACP - not fully discussed.       Lives with wife-2025   Social Drivers of Health   Financial Resource Strain: Low Risk  (11/18/2023)   Overall Financial Resource Strain (CARDIA)    Difficulty of Paying Living Expenses: Not hard at all  Food Insecurity: No Food Insecurity (11/18/2023)   Hunger Vital Sign    Worried About Running Out of Food in the Last Year: Never true    Ran Out of Food in the Last Year: Never true  Transportation Needs: No Transportation Needs (11/18/2023)   PRAPARE - Administrator, Civil Service (Medical): No    Lack of Transportation (Non-Medical): No  Physical Activity: Inactive (11/18/2023)   Exercise Vital Sign    Days of Exercise per Week: 0 days    Minutes of Exercise per Session: Not on file  Stress: Stress Concern Present (11/18/2023)   [employer] of Occupational Health - Occupational Stress Questionnaire    Feeling of Stress: To some extent  Social Connections: Moderately Isolated  (11/18/2023)   Social Connection and Isolation Panel    Frequency of Communication with Friends and Family: Three times a week    Frequency of Social Gatherings with Friends and Family: Three times a week    Attends Religious Services: Patient declined    Active Member of Clubs or Organizations: No    Attends Engineer, Structural: Not on file    Marital Status: Married     Family History:  The patient's family history includes Cancer in his sister and sister; Diabetes in his son and son; Heart attack in his father; Heart disease in his father, paternal uncle, and sister; Heart failure in his mother; Hyperlipidemia in his mother.   ROS:   Please see the history of present illness.    ROS All other systems reviewed and are negative.   PHYSICAL EXAM:   VS:  BP 128/82   Pulse 90   Ht 5' 7 (1.702 m)   Wt 223 lb 9.6 oz (101.4 kg)   SpO2 94%   BMI 35.02 kg/m    GENERAL:  Well appearing WM in NAD HEENT:  PERRL, EOMI, sclera are clear. Oropharynx is clear. NECK:  No jugular venous distention, carotid upstroke brisk and symmetric, no bruits, no thyromegaly or adenopathy LUNGS:  Clear to auscultation bilaterally CHEST:  Unremarkable HEART:  RRR,  PMI not displaced or sustained,S1 and S2 within normal limits, no S3, no S4: no clicks, no rubs, gr 2/6 systolic murmur RUSB ABD:  Soft, nontender. BS +, no masses or bruits. No hepatomegaly, no splenomegaly EXT:  2 + pulses throughout, 1-2+ pitting ankle edema, chronic  stasis skin changes.  SKIN:  Warm and dry.  No rashes NEURO:  Alert and oriented x 3. Cranial nerves II through XII intact. PSYCH:  Cognitively intact      Wt Readings from Last 3 Encounters:  03/30/24 223 lb 9.6 oz (101.4 kg)  01/31/24 228 lb 3.2 oz (103.5 kg)  01/06/24 226 lb 9.6 oz (102.8 kg)      Studies/Labs Reviewed:         Recent Labs: 12/02/2023: Magnesium  2.4; Pro B Natriuretic peptide (BNP) 64.0; TSH 1.96 01/06/2024: ALT 18; BUN 17; Creatinine  1.42; Hemoglobin 15.6; Platelet Count 137; Potassium 4.5; Sodium 141   Lipid Panel    Component Value Date/Time   CHOL 113 12/02/2023 1123   CHOL 121 03/11/2023 1404   TRIG 135.0 12/02/2023 1123   TRIG 108 05/20/2006 0825   HDL 44.90 12/02/2023 1123   HDL 56 03/11/2023 1404   CHOLHDL 3 12/02/2023 1123   VLDL 27.0 12/02/2023 1123   LDLCALC 41 12/02/2023 1123   LDLCALC 42 03/11/2023 1404   LDLCALC 136 (H) 12/26/2019 1150   LDLDIRECT 177.0 07/24/2015 1052    Additional studies/ records that were reviewed today include:    Cath 08/11/2016 Conclusion     Prox LAD to Mid LAD lesion, 30 %stenosed. Mid LAD to Dist LAD lesion, 65 %stenosed. Prox RCA to Mid RCA lesion, 15 %stenosed. The left ventricular systolic function is normal. LV end diastolic pressure is normal. The left ventricular ejection fraction is 55-65% by visual estimate. LV end diastolic pressure is normal.   1. Borderline single vessel obstructive CAD involving the mid LAD. FFR 0.81 2. Normal LV function EF 65% 3. Normal right heart and LV filling pressures 4. Normal Cardiac output   Plan: will refer to CT surgery for consideration of MV repair. The stenosis in the LAD will be discussed. He is asymptomatic and I would favor treating it medically. If he develops symptoms in the future it could be treated with PCI.          CABG with MVR 03/24/2017 Preoperative Diagnosis:       Severe Mitral Regurgitation Single-vessel Coronary Artery Disease   Postoperative Diagnosis:    Same   Procedure:        Mitral Valve Repair             Complex valvuloplasty including artificial Gore-tex neochord placement x6             Sorin Carbomedics Annuloflex posterior annuloplasty band (size 28mm, catalog #[id number], serial #[id number])   Coronary Artery Bypass Grafting x 1              Left Internal Mammary Artery to Distal Left Anterior Descending Coronary Artery      Echo 01/29/19: IMPRESSIONS     1. Left  ventricular ejection fraction, by visual estimation, is 50 to  55%. The left ventricle has normal function. Normal left ventricular size.  Left ventricular septal wall thickness was mildly increased. Mildly  increased left ventricular posterior  wall thickness. There is mildly increased left ventricular hypertrophy.   2. Elevated left ventricular end-diastolic pressure.   3. Left ventricular diastolic Doppler parameters are consistent with  pseudonormalization pattern of LV diastolic filling.   4. Global right ventricle has normal systolic function.The right  ventricular size is normal. No increase in right ventricular wall  thickness.   5. Left atrial size was normal.   6. Right atrial size was normal.   7. The mitral valve  is normal in structure. Mild mitral valve  regurgitation. Mild mitral stenosis.   8. The tricuspid valve is normal in structure. Tricuspid valve  regurgitation is mild.   9. The aortic valve is normal in structure. Aortic valve regurgitation  was not visualized by color flow Doppler. Structurally normal aortic  valve, with no evidence of sclerosis or stenosis.  10. Mild thickening and calcification of the aortic valve leaflets.  11. The pulmonic valve was normal in structure. Pulmonic valve  regurgitation is trivial by color flow Doppler.  12. Normal pulmonary artery systolic pressure.  13. The inferior vena cava is normal in size with greater than 50%  respiratory variability, suggesting right atrial pressure of 3 mmHg.   Echo 09/30/23: IMPRESSIONS     1. Left ventricular ejection fraction, by estimation, is 65 to 70%. The  left ventricle has normal function. The left ventricle has no regional  wall motion abnormalities. There is mild left ventricular hypertrophy.  Left ventricular diastolic parameters  are indeterminate.   2. Right ventricular systolic function is normal. The right ventricular  size is normal.   3. The mitral valve has been  repaired/replaced. Mild mitral valve  regurgitation. Moderate mitral stenosis. The mean mitral valve gradient is  9.0 mmHg with average heart rate of 87 bpm. There is a prior mitral valve  repair - complex valvuloplasty  including artificial Gore-tex neochord placement x6 and 28 mm Sorin  annuoloflex posterior annuloplasty band present in the mitral position.  Procedure Date: 03/24/2017.   4. The aortic valve is abnormal. There is severe calcifcation of the  aortic valve. Aortic valve regurgitation is trivial. Mild to moderate  aortic valve stenosis, with DVI 0.43 and SVI 28. Aortic valve area, by VTI  measures 1.35 cm. Aortic valve mean  gradient measures 12.3 mmHg. Aortic valve Vmax measures 2.35 m/s.   ASSESSMENT:    1. Nonrheumatic mitral valve stenosis   2. S/P mitral valve repair   3. Chronic diastolic congestive heart failure (HCC)   4. Coronary artery disease involving native coronary artery of native heart without angina pectoris   5. Stage 3a chronic kidney disease (HCC)           PLAN:  In order of problems listed above:  Severe MR s/p mitral valve annuloplasty/repair:  F/U Echo 2020  shows mild MR but moderate mitral stenosis. Recommend routine SBE prophylaxis. Given MS I would like to slow his HR to improve diastolic filling. Will increase Toprol  XL to 50 mg bid.  Would like to see resting HR around 60. Will also increase lasix  to 40 mg daily. Follow up in 3 months. He has follow up labs at the cancer center in next couple of weeks.   2.   History of PE now recurrent on 02/06/18. Submassive with RV strain. S/p EKOS and lytic therapy. On Xarelto  now.  Will need anticoagulation indefinitely.   3.    Chronic diastolic heart failure: Has  edema on exam.  Continue  Jardiance .  He does have venous insuffieciency. Recommend he take lasix  40 daily.  Sodium restriction. Support hose.  4.    S/p CABG x 1: LIMA to LAD. No angina.   5.    Stage IV lung CA. S/p resection with  recurrent malignant pleural effusion. On oral chemotherapy.  S/p RT for recurrent lung nodule. Stable. Due for follow up CT soon.   6.   DM 2: Managed by primary care provider- now on Jardiance .  7.  HLD off statin due to history of elevated CK on Lipitor. On Zetia . Now on Repatha . Last LDL excellent. 41.   8. CKD stage 3a. Followed by Nephrology. Last creatinine 1.45. will monitor on increased lasix  dose  9. SOB multifactorial. Related to MS, diastolic CHF, prior lung resection and PE, deconditioning. Will continue to optimize medication. If symptoms persist despite optimizing his heart therapy may need to consider [facility]  Follow up in 3 months.  Medication Adjustments/Labs and Tests Ordered: Current medicines are reviewed at length with the patient today.  Concerns regarding medicines are outlined above.  Medication changes, Labs and Tests ordered today are listed in the Patient Instructions below. There are no Patient Instructions on file for this visit.   Signed, Nyema Hachey, MD  03/30/2024 9:21 AM    [facility] [address] [place], [place], [place]  [address] Phone: [phone]; Fax: [phone]

## 2024-03-25 ENCOUNTER — Encounter (INDEPENDENT_AMBULATORY_CARE_PROVIDER_SITE_OTHER): Payer: Self-pay

## 2024-03-27 ENCOUNTER — Encounter: Payer: Self-pay | Admitting: Internal Medicine

## 2024-03-27 ENCOUNTER — Other Ambulatory Visit: Payer: Self-pay

## 2024-03-27 NOTE — Progress Notes (Signed)
 Specialty Pharmacy Refill Coordination Note  James [name] is a 73 y.o. male contacted today regarding refills of specialty medication(s) Osimertinib  Mesylate (TAGRISSO )   Patient requested No data recorded  Pickup date: No data recorded  Medication will be filled on: 03/29/24

## 2024-03-28 ENCOUNTER — Other Ambulatory Visit: Payer: Self-pay

## 2024-03-30 ENCOUNTER — Encounter: Payer: Self-pay | Admitting: Cardiology

## 2024-03-30 ENCOUNTER — Ambulatory Visit: Attending: Cardiology | Admitting: Cardiology

## 2024-03-30 VITALS — BP 128/82 | HR 90 | Ht 67.0 in | Wt 223.6 lb

## 2024-03-30 DIAGNOSIS — I5032 Chronic diastolic (congestive) heart failure: Secondary | ICD-10-CM

## 2024-03-30 DIAGNOSIS — I342 Nonrheumatic mitral (valve) stenosis: Secondary | ICD-10-CM | POA: Diagnosis not present

## 2024-03-30 DIAGNOSIS — Z9889 Other specified postprocedural states: Secondary | ICD-10-CM | POA: Diagnosis not present

## 2024-03-30 DIAGNOSIS — N1831 Chronic kidney disease, stage 3a: Secondary | ICD-10-CM

## 2024-03-30 DIAGNOSIS — I2510 Atherosclerotic heart disease of native coronary artery without angina pectoris: Secondary | ICD-10-CM | POA: Diagnosis not present

## 2024-03-30 MED ORDER — FUROSEMIDE 40 MG PO TABS: 40.0000 mg | ORAL_TABLET | Freq: Every day | ORAL | 3 refills | Status: AC

## 2024-03-30 MED ORDER — METOPROLOL SUCCINATE ER 50 MG PO TB24: 50.0000 mg | ORAL_TABLET | Freq: Every day | ORAL | 3 refills | Status: DC

## 2024-03-30 NOTE — Patient Instructions (Addendum)
 Medication Instructions:  Increase Toprol  to 50 mg twice a day Increase Lasix  to 40 mg daily Continue all other medications *If you need a refill on your cardiac medications before your next appointment, please call your pharmacy*  Lab Work: None ordered  Testing/Procedures: None ordered  Follow-Up: At [facility], you and your health needs are our priority.  As part of our continuing mission to provide you with exceptional heart care, our providers are all part of one team.  This team includes your primary Cardiologist (physician) and Advanced Practice Providers or APPs (Physician Assistants and Nurse Practitioners) who all work together to provide you with the care you need, when you need it.  Your next appointment:  3 months   Friday 2/6 at 10:20 am    Provider:  Dr.Jordan   We recommend signing up for the patient portal called MyChart.  Sign up information is provided on this After Visit Summary.  MyChart is used to connect with patients for Virtual Visits (Telemedicine).  Patients are able to view lab/test results, encounter notes, upcoming appointments, etc.  Non-urgent messages can be sent to your provider as well.   To learn more about what you can do with MyChart, go to [email or web address].

## 2024-03-31 LAB — COLOGUARD
COLOGUARD: NEGATIVE
Cologuard: NEGATIVE

## 2024-04-04 ENCOUNTER — Inpatient Hospital Stay: Attending: Internal Medicine

## 2024-04-04 ENCOUNTER — Ambulatory Visit (HOSPITAL_COMMUNITY)
Admission: RE | Admit: 2024-04-04 | Discharge: 2024-04-04 | Disposition: A | Source: Ambulatory Visit | Attending: Internal Medicine | Admitting: Internal Medicine

## 2024-04-04 ENCOUNTER — Other Ambulatory Visit: Payer: Self-pay | Admitting: Internal Medicine

## 2024-04-04 DIAGNOSIS — R918 Other nonspecific abnormal finding of lung field: Secondary | ICD-10-CM | POA: Diagnosis not present

## 2024-04-04 DIAGNOSIS — C3490 Malignant neoplasm of unspecified part of unspecified bronchus or lung: Secondary | ICD-10-CM

## 2024-04-04 DIAGNOSIS — C3411 Malignant neoplasm of upper lobe, right bronchus or lung: Secondary | ICD-10-CM | POA: Insufficient documentation

## 2024-04-04 DIAGNOSIS — N281 Cyst of kidney, acquired: Secondary | ICD-10-CM | POA: Diagnosis not present

## 2024-04-04 DIAGNOSIS — K7689 Other specified diseases of liver: Secondary | ICD-10-CM | POA: Diagnosis not present

## 2024-04-04 LAB — CMP (CANCER CENTER ONLY)
ALT: 23 U/L (ref 0–44)
AST: 27 U/L (ref 15–41)
Albumin: 4.2 g/dL (ref 3.5–5.0)
Alkaline Phosphatase: 65 U/L (ref 38–126)
Anion gap: 9 (ref 5–15)
BUN: 26 mg/dL — ABNORMAL HIGH (ref 8–23)
CO2: 22 mmol/L (ref 22–32)
Calcium: 9 mg/dL (ref 8.9–10.3)
Chloride: 110 mmol/L (ref 98–111)
Creatinine: 1.35 mg/dL — ABNORMAL HIGH (ref 0.61–1.24)
GFR, Estimated: 55 mL/min — ABNORMAL LOW (ref >60)
Glucose, Bld: 122 mg/dL — ABNORMAL HIGH (ref 70–99)
Potassium: 4.1 mmol/L (ref 3.5–5.1)
Sodium: 142 mmol/L (ref 135–145)
Total Bilirubin: 0.4 mg/dL (ref 0.0–1.2)
Total Protein: 6.8 g/dL (ref 6.5–8.1)

## 2024-04-04 LAB — CBC WITH DIFFERENTIAL (CANCER CENTER ONLY)
Abs Immature Granulocytes: 0.03 K/uL (ref 0.00–0.07)
Basophils Absolute: 0 K/uL (ref 0.0–0.1)
Basophils Relative: 1 %
Eosinophils Absolute: 0.1 K/uL (ref 0.0–0.5)
Eosinophils Relative: 2 %
HCT: 46.9 % (ref 39.0–52.0)
Hemoglobin: 15.5 g/dL (ref 13.0–17.0)
Immature Granulocytes: 0 %
Lymphocytes Relative: 24 %
Lymphs Abs: 1.7 K/uL (ref 0.7–4.0)
MCH: 31 pg (ref 26.0–34.0)
MCHC: 33 g/dL (ref 30.0–36.0)
MCV: 93.8 fL (ref 80.0–100.0)
Monocytes Absolute: 0.9 K/uL (ref 0.1–1.0)
Monocytes Relative: 12 %
Neutro Abs: 4.3 K/uL (ref 1.7–7.7)
Neutrophils Relative %: 61 %
Platelet Count: 135 K/uL — ABNORMAL LOW (ref 150–400)
RBC: 5 MIL/uL (ref 4.22–5.81)
RDW: 14.9 % (ref 11.5–15.5)
WBC Count: 7 K/uL (ref 4.0–10.5)
nRBC: 0 % (ref 0.0–0.2)

## 2024-04-04 MED ORDER — SODIUM CHLORIDE (PF) 0.9 % IJ SOLN
INTRAMUSCULAR | Status: AC
  Filled 2024-04-04: qty 50

## 2024-04-04 MED ORDER — IOHEXOL 300 MG/ML  SOLN
100.0000 mL | Freq: Once | INTRAMUSCULAR | Status: AC | PRN
  Administered 2024-04-04: 100 mL via INTRAVENOUS

## 2024-04-08 ENCOUNTER — Other Ambulatory Visit: Payer: Self-pay | Admitting: Cardiology

## 2024-04-10 ENCOUNTER — Encounter: Payer: Self-pay | Admitting: Cardiology

## 2024-04-10 MED ORDER — METOPROLOL SUCCINATE ER 50 MG PO TB24: 50.0000 mg | ORAL_TABLET | Freq: Two times a day (BID) | ORAL | 1 refills | Status: DC

## 2024-04-13 ENCOUNTER — Inpatient Hospital Stay: Attending: Internal Medicine | Admitting: Internal Medicine

## 2024-04-13 VITALS — BP 123/74 | HR 87 | Temp 97.9°F | Resp 17 | Ht 67.0 in | Wt 222.0 lb

## 2024-04-13 DIAGNOSIS — C3411 Malignant neoplasm of upper lobe, right bronchus or lung: Secondary | ICD-10-CM | POA: Insufficient documentation

## 2024-04-13 DIAGNOSIS — Z902 Acquired absence of lung [part of]: Secondary | ICD-10-CM | POA: Diagnosis not present

## 2024-04-13 DIAGNOSIS — R059 Cough, unspecified: Secondary | ICD-10-CM | POA: Diagnosis not present

## 2024-04-13 DIAGNOSIS — Z79899 Other long term (current) drug therapy: Secondary | ICD-10-CM | POA: Insufficient documentation

## 2024-04-13 DIAGNOSIS — Z923 Personal history of irradiation: Secondary | ICD-10-CM | POA: Diagnosis not present

## 2024-04-13 DIAGNOSIS — C3491 Malignant neoplasm of unspecified part of right bronchus or lung: Secondary | ICD-10-CM | POA: Diagnosis not present

## 2024-04-13 DIAGNOSIS — R21 Rash and other nonspecific skin eruption: Secondary | ICD-10-CM | POA: Insufficient documentation

## 2024-04-13 DIAGNOSIS — N189 Chronic kidney disease, unspecified: Secondary | ICD-10-CM | POA: Diagnosis not present

## 2024-04-13 NOTE — Progress Notes (Signed)
 [facility] Telephone:([phone]) [phone]   [phone]  OFFICE PROGRESS NOTE  Joshua Debby CROME, MD [address] [place] [address]  DIAGNOSIS: Recurrent non-small cell lung cancer, adenocarcinoma initially diagnosed as stage IA (T1c, N0, M0) non-small cell lung cancer, adenocarcinoma presented with right upper lobe lung nodule in April 2018 with recurrence in June 2019.  Biomarker Findings Microsatellite status - MS-Stable Tumor Mutational Burden - TMB-Low (3 Muts/Mb) Genomic Findings For a complete list of the genes assayed, please refer to the Appendix. EGFR exon 19 deletion ([id number]>P) CDKN2A/B loss NKX2-1 amplification 7 Disease relevant genes with no reportable alterations: KRAS, ALK, BRAF, MET, RET, ERBB2, ROS1   PRIOR THERAPY:  1) Status post right upper lobectomy with lymph node dissection under the care of Dr. Kerrin on 09/02/2016. 2) status post palliative radiotherapy to enlarging right upper lobe lung nodule under the care of Dr. Dewey completed on Oct 07, 2021.  CURRENT THERAPY: Tagrisso  80 mg p.o. daily.  First dose started November 05, 2017.  Status post 77  months of treatment.  INTERVAL HISTORY: James [name] 73 y.o. male returns to the clinic today for his 3 months follow-up visit accompanied by his wife. Discussed the use of AI scribe software for clinical note transcription with the patient, who gave verbal consent to proceed.  History of Present Illness James [name] is a 73 year old male with metastatic non-small cell lung cancer who presents for evaluation with repeat CT scan for restaging of his disease.  He has a history of metastatic non-small cell lung cancer, adenocarcinoma, initially diagnosed as stage I in April 2018. The disease recurred in June 2019, and he has a positive EGFR mutation with exon 19 deletion. He has been on treatment with Tagrisso  80 mg PO daily since June 2019, totaling 77 months of  treatment.  He experiences side effects from Tagrisso , including digestive issues and a rash on his face. He also has a history of coughing, which he does not attribute to Tagrisso .  He has had issues with scheduling his CT scans correctly, noting that they were sometimes listed as just a chest X-ray or chest CT without the full scan. He ensures the correct imaging is performed.  His kidney function has been a concern in the past, affecting the decision to use contrast in imaging studies.  No new symptoms and no new side effects from Tagrisso  beyond those previously mentioned.     MEDICAL HISTORY: Past Medical History:  Diagnosis Date   Adenocarcinoma of right lung, stage 1 (HCC) 09/06/2016   Anxiety    Arthritis    knees, hips, back (10/19/2012)   Cataract 2018   Chronic diastolic congestive heart failure (HCC)    Chronic kidney disease 2021   Chronic lower back pain    Colon polyps    10/27/2002, repeat letter 09/17/2007   Coronary artery disease    Coronary artery disease involving native coronary artery of native heart without angina pectoris    Depressive disorder, not elsewhere classified    no meds   Diabetes mellitus without complication (HCC)    diet controlled- no med  (while in hosp 4/18 -elevated cbg   Dyspnea    Fasting hyperglycemia    GERD (gastroesophageal reflux disease)    Heart murmur    Hemoptysis    abnormal CT Chest 01/29/10 - ? new GG changes RUL > not viz on plain cxr 02/26/2010   Hypertension  Mitral regurgitation    severe MR 08/2016   MPN (myeloproliferative neoplasm) (HCC)    1st detected 06/04/1998   Obesity    OSA on CPAP    last sleep study 10 years ago   Other and unspecified hyperlipidemia    Peripheral vascular disease 08/2016   after lung surgey small clots in lungs,after hip dvt-5/16   Pneumonia    4/18   Positive PPD 1965   non reactive in 2012 (10/19/2012)   Routine general medical examination at a health care facility    S/P  CABG x 1 03/24/2017   LIMA to LAD   S/P mitral valve repair 03/24/2017   Complex valvuloplasty including artificial Gore-tex neochord placement x6 and 28 mm Sorin Annuloflex posterior annuloplasty band   Sleep apnea 2005   Special screening for malignant neoplasm of prostate    Spinal stenosis, unspecified region other than cervical    Wrist pain, left     ALLERGIES:  is allergic to symbicort  [budesonide -formoterol  fumarate] and amoxicillin.  MEDICATIONS:  Current Outpatient Medications  Medication Sig Dispense Refill   clindamycin  (CLEOCIN  T) 1 % external solution APPLY TOPICALLY TWICE A DAY 60 mL 0   Evolocumab  (REPATHA  SURECLICK) 140 MG/ML SOAJ INJECT 140 MG INTO THE SKIN EVERY 14 (FOURTEEN) DAYS. 6 mL 3   ezetimibe  (ZETIA ) 10 MG tablet TAKE 1 TABLET BY MOUTH EVERY DAY 90 tablet 3   furosemide  (LASIX ) 40 MG tablet Take 1 tablet (40 mg total) by mouth daily. 90 tablet 3   JARDIANCE  10 MG TABS tablet TAKE 1 TABLET BY MOUTH DAILY BEFORE BREAKFAST. 90 tablet 0   metoprolol  succinate (TOPROL -XL) 50 MG 24 hr tablet Take 1 tablet (50 mg total) by mouth in the morning and at bedtime. Take with or immediately following a meal. 180 tablet 1   Multiple Vitamin (MULTIVITAMIN) tablet Take 1 tablet by mouth daily.     Omega-3 Fatty Acids (FISH OIL) 500 MG CAPS Take 500 mg by mouth daily.      osimertinib  mesylate (TAGRISSO ) 80 MG tablet TAKE 1 TABLET (80 MG TOTAL) BY MOUTH DAILY. 30 tablet 2   rivaroxaban  (XARELTO ) 20 MG TABS tablet Take 1 tablet (20 mg total) by mouth daily with supper. 90 tablet 3   No current facility-administered medications for this visit.    SURGICAL HISTORY:  Past Surgical History:  Procedure Laterality Date   ANTERIOR CRUCIATE LIGAMENT REPAIR Left 1967   CARDIAC CATHETERIZATION  2000   CARDIAC VALVE REPLACEMENT  03/24/2017   Mitral Repair   CHEST TUBE INSERTION Right 10/19/2012   post bronch   COLONOSCOPY W/ POLYPECTOMY     CORONARY ARTERY BYPASS GRAFT N/A  03/24/2017   Procedure: CORONARY ARTERY BYPASS GRAFTING (CABG)x1 using left internal mammary artery, LIMA-LAD;  Surgeon: Dusty Sudie DEL, MD;  Location: [facility] OR;  Service: Open Heart Surgery;  Laterality: N/A;   CORONARY PRESSURE/FFR STUDY N/A 08/11/2016   Procedure: Intravascular Pressure Wire/FFR Study;  Surgeon: Peter M Jordan, MD;  Location: [facility] INVASIVE CV LAB;  Service: Cardiovascular;  Laterality: N/A;   FLEXIBLE BRONCHOSCOPY  10/19/2012   Flexible video fiberoptic bronchoscopy with electromagnetic navigation and biopsies.   IR ANGIOGRAM PULMONARY BILATERAL SELECTIVE  02/06/2018   IR ANGIOGRAM SELECTIVE EACH ADDITIONAL VESSEL  02/06/2018   IR ANGIOGRAM SELECTIVE EACH ADDITIONAL VESSEL  02/06/2018   IR INFUSION THROMBOL ARTERIAL INITIAL (MS)  02/06/2018   IR INFUSION THROMBOL ARTERIAL INITIAL (MS)  02/06/2018   IR THROMB F/U EVAL ART/VEN FINAL DAY (  MS)  02/07/2018   IR US  GUIDE VASC ACCESS RIGHT  02/06/2018   JOINT REPLACEMENT  4 /25/2016   hip   LOBECTOMY Right 09/02/2016   Procedure: RIGHT UPPER LOBECTOMY;  Surgeon: Elspeth JAYSON Millers, MD;  Location: [facility] OR;  Service: Thoracic;  Laterality: Right;   LYMPH NODE DISSECTION Right 09/02/2016   Procedure: LYMPH NODE DISSECTION, RIGHT LUNG;  Surgeon: Elspeth JAYSON Millers, MD;  Location: [facility] OR;  Service: Thoracic;  Laterality: Right;   MITRAL VALVE REPAIR N/A 03/24/2017   Procedure: MITRAL VALVE REPAIR (MVR) with Sorin [employer] Annuloflex size 28;  Surgeon: Dusty Sudie DEL, MD;  Location: [facility] OR;  Service: Open Heart Surgery;  Laterality: N/A;   RIGHT/LEFT HEART CATH AND CORONARY ANGIOGRAPHY N/A 08/11/2016   Procedure: Right/Left Heart Cath and Coronary Angiography;  Surgeon: Peter M Jordan, MD;  Location: [facility] INVASIVE CV LAB;  Service: Cardiovascular;  Laterality: N/A;   TEE WITHOUT CARDIOVERSION N/A 07/17/2016   Procedure: TRANSESOPHAGEAL ECHOCARDIOGRAM (TEE);  Surgeon: Vinie JAYSON Maxcy, MD;  Location: [facility] ENDOSCOPY;  Service:  Cardiovascular;  Laterality: N/A;   TEE WITHOUT CARDIOVERSION N/A 03/24/2017   Procedure: TRANSESOPHAGEAL ECHOCARDIOGRAM (TEE);  Surgeon: Dusty Sudie DEL, MD;  Location: [facility] OR;  Service: Open Heart Surgery;  Laterality: N/A;   TONSILLECTOMY  1950's   TOTAL HIP ARTHROPLASTY Left 10/05/2014   dr hiram   TOTAL HIP ARTHROPLASTY Left 10/05/2014   Procedure: LEFT TOTAL HIP ARTHROPLASTY ANTERIOR APPROACH;  Surgeon: Dempsey Moan, MD;  Location: [facility] OR;  Service: Orthopedics;  Laterality: Left;   VIDEO ASSISTED THORACOSCOPY (VATS)/WEDGE RESECTION Right 09/02/2016   Procedure: VIDEO ASSISTED THORACOSCOPY (VATS)/RIGHT UPPER LOBE WEDGE RESECTION;  Surgeon: Elspeth JAYSON Millers, MD;  Location: [facility] OR;  Service: Thoracic;  Laterality: Right;   VIDEO BRONCHOSCOPY WITH ENDOBRONCHIAL NAVIGATION N/A 10/19/2012   Procedure: VIDEO BRONCHOSCOPY WITH ENDOBRONCHIAL NAVIGATION;  Surgeon: Lamar GORMAN Chris, MD;  Location: [facility] OR;  Service: Thoracic;  Laterality: N/A;   WRIST RECONSTRUCTION Left 12/2009   'proximal row carpectomy [name]    REVIEW OF SYSTEMS:  Constitutional: positive for fatigue Eyes: negative Ears, nose, mouth, throat, and face: negative Respiratory: negative Cardiovascular: negative Gastrointestinal: negative Genitourinary:negative Integument/breast: negative Hematologic/lymphatic: negative Musculoskeletal:positive for back pain Neurological: negative Behavioral/Psych: negative Endocrine: negative Allergic/Immunologic: negative   PHYSICAL EXAMINATION: General appearance: alert, cooperative, fatigued, and no distress Head: Normocephalic, without obvious abnormality, atraumatic Neck: no adenopathy, no JVD, supple, symmetrical, trachea midline, and thyroid  not enlarged, symmetric, no tenderness/mass/nodules Lymph nodes: Cervical, supraclavicular, and axillary nodes normal. Resp: clear to auscultation bilaterally Back: symmetric, no curvature. ROM normal. No CVA tenderness. Cardio: regular  rate and rhythm, S1, S2 normal, no murmur, click, rub or gallop GI: soft, non-tender; bowel sounds normal; no masses,  no organomegaly Extremities: extremities normal, atraumatic, no cyanosis or edema Neurologic: Alert and oriented X 3, normal strength and tone. Normal symmetric reflexes. Normal coordination and gait  ECOG PERFORMANCE STATUS: 1 - Symptomatic but completely ambulatory  Blood pressure 123/74, pulse 87, temperature 97.9 F (36.6 C), temperature source Temporal, resp. rate 17, height 5' 7 (1.702 m), weight 222 lb (100.7 kg), SpO2 96%.  LABORATORY DATA: Lab Results  Component Value Date   WBC 7.0 04/04/2024   HGB 15.5 04/04/2024   HCT 46.9 04/04/2024   MCV 93.8 04/04/2024   PLT 135 (L) 04/04/2024      Chemistry      Component Value Date/Time   NA 142 04/04/2024 1024   NA 142 10/01/2016 1534   K 4.1 04/04/2024  1024   K 4.2 10/01/2016 1534   CL 110 04/04/2024 1024   CO2 22 04/04/2024 1024   CO2 28 10/01/2016 1534   BUN 26 (H) 04/04/2024 1024   BUN 15.9 10/01/2016 1534   CREATININE 1.35 (H) 04/04/2024 1024   CREATININE 1.10 02/08/2017 1101   CREATININE 1.5 (H) 10/01/2016 1534      Component Value Date/Time   CALCIUM  9.0 04/04/2024 1024   CALCIUM  9.6 10/01/2016 1534   ALKPHOS 65 04/04/2024 1024   ALKPHOS 65 10/01/2016 1534   AST 27 04/04/2024 1024   AST 20 10/01/2016 1534   ALT 23 04/04/2024 1024   ALT 24 10/01/2016 1534   BILITOT 0.4 04/04/2024 1024   BILITOT 0.46 10/01/2016 1534       RADIOGRAPHIC STUDIES: CT CHEST ABDOMEN PELVIS W CONTRAST Result Date: 04/12/2024 CLINICAL DATA:  Non-small cell lung cancer, staging. * Tracking Code: BO * EXAM: CT CHEST, ABDOMEN, AND PELVIS WITH CONTRAST TECHNIQUE: Multidetector CT imaging of the chest, abdomen and pelvis was performed following the standard protocol during bolus administration of intravenous contrast. RADIATION DOSE REDUCTION: This exam was performed according to the departmental dose-optimization  program which includes automated exposure control, adjustment of the mA and/or kV according to patient size and/or use of iterative reconstruction technique. CONTRAST:  OMNIPAQUE  IOHEXOL  300 MG/ML  SOLN COMPARISON:  CT of the chest, abdomen and pelvis 09/27/2023 and 03/23/2023. FINDINGS: CT CHEST FINDINGS Cardiovascular: Atherosclerosis of the aorta, great vessels and coronary arteries status post median sternotomy and CABG. No acute vascular findings are demonstrated. There are aortic valvular and mitral annular calcifications. The heart size is normal. There is no pericardial effusion. Mediastinum/Nodes: There are no enlarged mediastinal, hilar or axillary lymph nodes. Surgical clips in the right axilla. The thyroid  gland, trachea and esophagus demonstrate no significant findings. Lungs/Pleura: Stable trace right pleural effusion. No pneumothorax. Stable postsurgical changes from previous right upper lobectomy. Scattered pulmonary nodules are again noted bilaterally, measuring up to 3 mm in the right lower lobe on image 63/6. No new or enlarging nodules are identified. There are scattered calcified granulomas. There is chronic scarring in the right middle and left lower lobes. Musculoskeletal/Chest wall: No chest wall mass or suspicious osseous findings. Healed median sternotomy. Sternoclavicular degenerative changes bilaterally. CT ABDOMEN AND PELVIS FINDINGS Hepatobiliary: The liver is normal in density without suspicious focal abnormality. Stable hepatic cysts. No evidence of gallstones, gallbladder wall thickening or biliary dilatation. Pancreas: Unremarkable. No pancreatic ductal dilatation or surrounding inflammatory changes. Spleen: Normal in size without focal abnormality. Adrenals/Urinary Tract: Both adrenal glands appear normal. No evidence of urinary tract calculus, suspicious renal lesion or hydronephrosis. Stable cortical cyst in the lower pole of the left kidney and renal sinus cysts  bilaterally. No specific follow-up imaging recommended. The bladder appears unremarkable for its degree of distention. Stomach/Bowel: No enteric contrast administered. The stomach appears unremarkable for its degree of distension. No evidence of bowel wall thickening, distention or surrounding inflammatory change. The appendix appears normal. Mildly prominent stool throughout the colon. Vascular/Lymphatic: There are no enlarged abdominal or pelvic lymph nodes. Aortic and branch vessel atherosclerosis. No evidence of aneurysm or large vessel occlusion. Reproductive: Stable mild enlargement and central heterogeneity of the prostate gland. Other: No evidence of abdominal wall mass or hernia. No ascites or pneumoperitoneum. Musculoskeletal: No acute or significant osseous findings. Left total hip arthroplasty. Mild degenerative changes of the right hip. Multilevel spondylosis. IMPRESSION: 1. Stable CT's of the chest, abdomen and pelvis status post right  upper lobectomy. 2. No evidence of local recurrence or metastatic disease. 3. Stable small pulmonary nodules bilaterally, likely benign based on long-term stability. 4.  Aortic Atherosclerosis (ICD10-I70.0). Electronically Signed   By: Elsie Perone M.D.   On: 04/12/2024 11:34      ASSESSMENT AND PLAN: This is a very pleasant 73 years old white male with a stage Ia non-small cell lung cancer status post right upper lobectomy with lymph node dissection on September 02, 2016. The patient he was on observation since that time. Unfortunately there are some concerning findings with nodular density in the posterior right lower lobe as well as increase and right pleural effusion suspicious for disease recurrence. He underwent ultrasound-guided right thoracentesis and the cytology of the pleural fluid was consistent with recurrent adenocarcinoma. He had molecular studies performed by [facility] 1 that showed positive EGFR mutation with deletion 19. The patient was  started on treatment with Tagrisso  80 mg p.o. daily on 11/05/2017.  He status post 77 months of treatment. He recently underwent SBRT to the enlarging right lung nodule under the care of Dr. Dewey. He continues to tolerate his treatment with Tagrisso  fairly well. He had repeat CT scan of the chest, abdomen and pelvis performed recently.  I personally independently reviewed the scan and discussed the result with the patient and his wife.  His scan showed no concerning findings for disease recurrence or metastasis. Assessment and Plan Assessment & Plan Metastatic EGFR-mutated non-small cell lung adenocarcinoma Initially diagnosed as stage 1 in April 2018 with recurrence in June 2019. Positive EGFR mutation with deletion in exon 19. Currently on Tagrisso  (osimertinib ) 80 mg PO daily since June 2019. Recent CT scan of the chest, abdomen, and pelvis shows no concerning findings, indicating disease control. - Continue Tagrisso  (osimertinib ) 80 mg PO daily - Scheduled follow-up in 3 months with blood work  Adverse effects of Tagrisso  (osimertinib ): rash and digestive symptoms Experiencing rash on the face and digestive symptoms as side effects of Tagrisso . No new side effects reported. - Continue to monitor for side effects of Tagrisso   Chronic kidney disease Kidney function is well-managed. CT scan was planned without contrast due to previous elevated kidney function tests. Decision to use contrast will be based on current kidney function results. - Ordered CT scan with contrast if kidney function is stable - Monitor kidney function regularly  Cough Intermittent cough reported, not believed to be related to Tagrisso . He was advised to call immediately if he has any other concerning symptoms in the interval.  The patient voices understanding of current disease status and treatment options and is in agreement with the current care plan. All questions were answered. The patient knows to call the  clinic with any problems, questions or concerns. We can certainly see the patient much sooner if necessary. The total time spent in the appointment was 30 minutes.  Disclaimer: This note was dictated with voice recognition software. Similar sounding words can inadvertently be transcribed and may not be corrected upon review.

## 2024-04-21 ENCOUNTER — Other Ambulatory Visit: Payer: Self-pay

## 2024-04-24 ENCOUNTER — Other Ambulatory Visit: Payer: Self-pay

## 2024-04-25 ENCOUNTER — Other Ambulatory Visit: Payer: Self-pay

## 2024-04-25 NOTE — Progress Notes (Signed)
 Specialty Pharmacy Refill Coordination Note  James [name] is a 73 y.o. male contacted today regarding refills of specialty medication(s) Osimertinib  Mesylate (TAGRISSO )   Patient requested (Patient-Rptd) Pickup at [facility] at [facility] date: (Patient-Rptd) 04/26/24   Medication will be filled on: 04/25/24

## 2024-04-28 ENCOUNTER — Other Ambulatory Visit: Payer: Self-pay | Admitting: Cardiology

## 2024-04-28 NOTE — Telephone Encounter (Signed)
 Prescription refill request for Xarelto  received.  Indication:  Pulmonary Embolism Last office visit:  04/04/2024 Weight:  222 lbs. Age:  73 yrs old Scr:  1.35 CrCl:   54 mL/min  Pt meet protocol medication sent to pt's pharmacy for 6 mos

## 2024-05-18 ENCOUNTER — Other Ambulatory Visit: Payer: Self-pay

## 2024-05-19 ENCOUNTER — Other Ambulatory Visit: Payer: Self-pay

## 2024-05-19 ENCOUNTER — Other Ambulatory Visit (HOSPITAL_COMMUNITY): Payer: Self-pay

## 2024-05-19 NOTE — Progress Notes (Signed)
 Specialty Pharmacy Refill Coordination Note  James [name] is a 74 y.o. male contacted today regarding refills of specialty medication(s) Osimertinib  Mesylate (TAGRISSO )   Patient requested [name] at [facility] at [place] date: 05/24/24   Medication will be filled on: 05/23/24

## 2024-05-23 ENCOUNTER — Other Ambulatory Visit: Payer: Self-pay

## 2024-05-24 ENCOUNTER — Encounter: Payer: Self-pay | Admitting: Pharmacist

## 2024-05-24 ENCOUNTER — Other Ambulatory Visit (HOSPITAL_COMMUNITY): Payer: Self-pay

## 2024-05-24 NOTE — Progress Notes (Signed)
 Pharmacy Quality Measure Review  This patient is appearing on the insurance-providing list for being at risk of failing the adherence measure for Statin Therapy for Patients with Cardiovascular Disease [facility]) medications this calendar year.  Per review of chart and payor information, patient has a diagnosis of cardiovascular disease but is not currently filling a statin prescription.   Reviewed 03/30/24 cardio note - pt is on Repatha  and ezetimibe  due to hx of elevated CK on atorvastatin . Has also been on rosuvastatin  in the past per med list.  Needing statin exclusion code added. Will notify cardio pharmacist since patient has upcoming cardio appt on 06/16/24.  Darrelyn Drum, PharmD, BCPS, CPP Clinical Pharmacist Practitioner [facility] [phone]

## 2024-05-26 ENCOUNTER — Other Ambulatory Visit: Payer: Self-pay | Admitting: Internal Medicine

## 2024-05-26 DIAGNOSIS — I5032 Chronic diastolic (congestive) heart failure: Secondary | ICD-10-CM

## 2024-05-26 DIAGNOSIS — E118 Type 2 diabetes mellitus with unspecified complications: Secondary | ICD-10-CM

## 2024-05-26 DIAGNOSIS — N1831 Chronic kidney disease, stage 3a: Secondary | ICD-10-CM

## 2024-05-30 ENCOUNTER — Encounter: Payer: Self-pay | Admitting: Internal Medicine

## 2024-06-01 ENCOUNTER — Telehealth: Payer: Self-pay

## 2024-06-01 NOTE — Telephone Encounter (Signed)
 Copied from CRM #[id number]. Topic: Clinical - Medication Question >> Jun 01, 2024 10:32 AM Viola F wrote: Reason for CRM: Patient called to let Dr. Joshua know that he scheduled physical for 07/05/24 and wants to know if he will be filling the Jardiance  medication?

## 2024-06-01 NOTE — Telephone Encounter (Signed)
 Copied from CRM #[id number]. Topic: Clinical - Request for Lab/Test Order >> Jun 01, 2024 10:31 AM Viola FALCON wrote: Reason for CRM: Patient scheduled physical for 07/05/24 and would like labs done prior to appointment. Please call him when lab orders are put in at  [phone]

## 2024-06-02 ENCOUNTER — Other Ambulatory Visit: Payer: Self-pay

## 2024-06-02 DIAGNOSIS — N1831 Chronic kidney disease, stage 3a: Secondary | ICD-10-CM

## 2024-06-02 DIAGNOSIS — E118 Type 2 diabetes mellitus with unspecified complications: Secondary | ICD-10-CM

## 2024-06-02 DIAGNOSIS — I5032 Chronic diastolic (congestive) heart failure: Secondary | ICD-10-CM

## 2024-06-02 MED ORDER — EMPAGLIFLOZIN 10 MG PO TABS: 10.0000 mg | ORAL_TABLET | Freq: Every day | ORAL | 0 refills | Status: AC

## 2024-06-02 NOTE — Telephone Encounter (Signed)
 Medication has been refilled.

## 2024-06-02 NOTE — Telephone Encounter (Signed)
 Patient has been made aware that Dr. Joshua doesn't order labs prior to appointments. He gave a verbal understanding.

## 2024-06-14 ENCOUNTER — Other Ambulatory Visit: Payer: Self-pay

## 2024-06-14 ENCOUNTER — Other Ambulatory Visit: Payer: Self-pay | Admitting: Internal Medicine

## 2024-06-14 DIAGNOSIS — C3491 Malignant neoplasm of unspecified part of right bronchus or lung: Secondary | ICD-10-CM

## 2024-06-14 MED ORDER — OSIMERTINIB MESYLATE 80 MG PO TABS
ORAL_TABLET | Freq: Every day | ORAL | 2 refills | Status: AC
  Filled 2024-06-15: qty 30, 30d supply, fill #0

## 2024-06-14 NOTE — Progress Notes (Signed)
 Specialty Pharmacy Ongoing Clinical Assessment Note  James [name] is a 74 y.o. male who is being followed by the specialty pharmacy service for RxSp Oncology   Patient's specialty medication(s) reviewed today: Osimertinib  Mesylate (TAGRISSO )   Missed doses in the last 4 weeks: 0   Patient/Caregiver did not have any additional questions or concerns.   Therapeutic benefit summary: Patient is achieving benefit   Adverse events/side effects summary: Experienced adverse events/side effects (digestive issues and occasional skin rash, but all very minor)   Patient's therapy is appropriate to: Continue    Goals Addressed             This Visit's Progress    Stabilization of disease   On track    Patient is on track. Patient will maintain adherence. Per visit on 04/13/24, recent scans showed no concerning findings for disease progression .         Follow up: 6 months  [facility]

## 2024-06-14 NOTE — Progress Notes (Signed)
 Specialty Pharmacy Refill Coordination Note  James [name] is a 74 y.o. male contacted today regarding refills of specialty medication(s) Osimertinib  Mesylate (TAGRISSO )   Patient requested [name] at [facility] at [place] date: 06/21/24   Medication will be filled on: 06/20/24   This fill date is pending response to refill request from provider. Patient is aware and if they have not received fill by intended date they must follow up with pharmacy.

## 2024-06-15 ENCOUNTER — Other Ambulatory Visit: Payer: Self-pay

## 2024-06-15 NOTE — Progress Notes (Unsigned)
 d   Cardiology Office Note    Date:  06/16/2024   ID:  James [name], DOB Mar 03, 1951, MRN [id number]  PCP:  James Debby CROME, MD  Cardiologist:  Dr. Jaymie [name]   Chief Complaint  Patient presents with   Follow-up   Nonrheumatic mitral valve stenosis    History of Present Illness:  James [name] is a 74 y.o. male with PMH of DM II, HLD, and severe MR. he was originally noted to have a loud murmur by his primary care physician.  Initial echocardiogram suggested mild MR, however it also suggested partially flail leaflet.  MR was thought to be underestimated.  TEE showed a flail P3 segment with ruptured cord and a severe MR.  Cardiac catheterization performed on 08/11/2016 showed 65% mid to distal LAD lesion, FFR borderline at 0.81, EF 65%.  Normal cardiac output and RV/LV filling pressure.  He had a PET scan on 08/17/2016 that showed a hypermetabolic solid 2.6 cm anterior right upper lobe pulmonary nodule compatible with prior bronchogenic, no hypermetabolic thoracic lymphadenopathy or distal metastatic disease.  There were nonspecific and mild asymmetric hypermetabolism in the left pontine tonsil and the left tongue base region.  CT surgery recommended lobectomy for his lung mass with possible CABG with LIMA to LAD and MV repair.  He underwent right upper lobe lobectomy. Pathology positive for adenocarcinoma stage Ia.  Serial CT scan was recommended by oncology.  Post procedure, he became very short of breath.  CTA was positive for a small PE, he was started on Xarelto .  Repeat echocardiogram obtained on 02/15/2017 showed EF 60-65%, persistent MR.  He eventually underwent mitral valve repair with Sorin Carbomedics Annuloglex size 28, LIMA to LAD by Dr. Dusty. He was DC on coumadin  instead of Xarelto . Completed 6 months of anticoagulation for provoked PE and then it was stopped.   In August 20-19 CT showed a right pleural effusion. Thoracentesis was positive for recurrent CA. He was started on Tagrisso  by Dr.  Emery. He was admitted on February 06, 2018 with submassive PE. He was treated with  EKOS with bilateral intra-arterial TPA, patient completed 6 days of IV heparin . The original plan was to transition to Xarelto  after 5 days of IV heparin .  However he developed left flank pain, hematuria and right leg painful swelling.  Hence IV heparin  was continued.  Both Urology and OIrthopedics evaluated and cleared for transitioning to Xarelto .  Xarelto  was started on 02/12/2018.  Hematuria decreased  Right leg pain also improved. MRI did show evidence of hematoma that was improving. He had chronic DVT in right peroneal vein.  Since patient was started on Xarelto , aspirin  discontinued to minimize bleeding risk.   In April 2023 he had a new nodule noted on Chest CT and had RT for this area.  Sees Dr Tobie now for CKD. He was started on Jardiance  and initially noted significant improvement in edema. He notes he is taking lasix  only 2-4 times a month. Weight is up 4 lbs. Activity limited by back pain. Legs swell occasionally. Notes more SOB an fatigue. Did have a lot of wheezing associated with URI a few weeks ago.   Repeat Echo in May showed normal LV function. There was moderate mitral stenosis and mild to moderate aortic stenosis.   On his last visit we did increase his Toprol  XL to 50 mg bid to allow more diastolic filling with his mitral stenosis.  Since then breathing is about the same, some improvement in edema. He  will sometimes only take 20 mg lasix  if he is planning to be out.     Past Medical History:  Diagnosis Date   Adenocarcinoma of right lung, stage 1 (HCC) 09/06/2016   Anxiety    Arthritis    knees, hips, back (10/19/2012)   Cataract 2018   Chronic diastolic congestive heart failure (HCC)    Chronic kidney disease 2021   Chronic lower back pain    Colon polyps    10/27/2002, repeat letter 09/17/2007   Coronary artery disease    Coronary artery disease involving native coronary artery of  native heart without angina pectoris    Depressive disorder, not elsewhere classified    no meds   Diabetes mellitus without complication (HCC)    diet controlled- no med  (while in hosp 4/18 -elevated cbg   Dyspnea    Fasting hyperglycemia    GERD (gastroesophageal reflux disease)    Heart murmur    Hemoptysis    abnormal CT Chest 01/29/10 - ? new GG changes RUL > not viz on plain cxr 02/26/2010   Hypertension    Mitral regurgitation    severe MR 08/2016   MPN (myeloproliferative neoplasm) (HCC)    1st detected 06/04/1998   Obesity    OSA on CPAP    last sleep study 10 years ago   Other and unspecified hyperlipidemia    Peripheral vascular disease 08/2016   after lung surgey small clots in lungs,after hip dvt-5/16   Pneumonia    4/18   Positive PPD 1965   non reactive in 2012 (10/19/2012)   Routine general medical examination at a health care facility    S/P CABG x 1 03/24/2017   LIMA to LAD   S/P mitral valve repair 03/24/2017   Complex valvuloplasty including artificial Gore-tex neochord placement x6 and 28 mm Sorin Annuloflex posterior annuloplasty band   Sleep apnea 2005   Special screening for malignant neoplasm of prostate    Spinal stenosis, unspecified region other than cervical    Wrist pain, left     Past Surgical History:  Procedure Laterality Date   ANTERIOR CRUCIATE LIGAMENT REPAIR Left 1967   CARDIAC CATHETERIZATION  2000   CARDIAC VALVE REPLACEMENT  03/24/2017   Mitral Repair   CHEST TUBE INSERTION Right 10/19/2012   post bronch   COLONOSCOPY W/ POLYPECTOMY     CORONARY ARTERY BYPASS GRAFT N/A 03/24/2017   Procedure: CORONARY ARTERY BYPASS GRAFTING (CABG)x1 using left internal mammary artery, LIMA-LAD;  Surgeon: Dusty Sudie DEL, MD;  Location: [facility] OR;  Service: Open Heart Surgery;  Laterality: N/A;   CORONARY PRESSURE/FFR STUDY N/A 08/11/2016   Procedure: Intravascular Pressure Wire/FFR Study;  Surgeon: Ranetta Armacost M Neamiah Sciarra, MD;  Location: [facility] INVASIVE CV LAB;   Service: Cardiovascular;  Laterality: N/A;   FLEXIBLE BRONCHOSCOPY  10/19/2012   Flexible video fiberoptic bronchoscopy with electromagnetic navigation and biopsies.   IR ANGIOGRAM PULMONARY BILATERAL SELECTIVE  02/06/2018   IR ANGIOGRAM SELECTIVE EACH ADDITIONAL VESSEL  02/06/2018   IR ANGIOGRAM SELECTIVE EACH ADDITIONAL VESSEL  02/06/2018   IR INFUSION THROMBOL ARTERIAL INITIAL (MS)  02/06/2018   IR INFUSION THROMBOL ARTERIAL INITIAL (MS)  02/06/2018   IR THROMB F/U EVAL ART/VEN FINAL DAY (MS)  02/07/2018   IR US  GUIDE VASC ACCESS RIGHT  02/06/2018   JOINT REPLACEMENT  4 /25/2016   hip   LOBECTOMY Right 09/02/2016   Procedure: RIGHT UPPER LOBECTOMY;  Surgeon: Elspeth JAYSON Millers, MD;  Location[facility] OR;  Service: Thoracic;  Laterality: Right;   LYMPH NODE DISSECTION Right 09/02/2016   Procedure: LYMPH NODE DISSECTION, RIGHT LUNG;  Surgeon: Elspeth JAYSON Millers, MD;  Location: [facility] OR;  Service: Thoracic;  Laterality: Right;   MITRAL VALVE REPAIR N/A 03/24/2017   Procedure: MITRAL VALVE REPAIR (MVR) with Sorin Carbomedics Annuloflex size 28;  Surgeon: Dusty Sudie DEL, MD;  Location: [facility] OR;  Service: Open Heart Surgery;  Laterality: N/A;   RIGHT/LEFT HEART CATH AND CORONARY ANGIOGRAPHY N/A 08/11/2016   Procedure: Right/Left Heart Cath and Coronary Angiography;  Surgeon: Daejah Klebba M Anayelli Lai, MD;  Location: [facility] INVASIVE CV LAB;  Service: Cardiovascular;  Laterality: N/A;   TEE WITHOUT CARDIOVERSION N/A 07/17/2016   Procedure: TRANSESOPHAGEAL ECHOCARDIOGRAM (TEE);  Surgeon: Vinie JAYSON Maxcy, MD;  Location: [facility] ENDOSCOPY;  Service: Cardiovascular;  Laterality: N/A;   TEE WITHOUT CARDIOVERSION N/A 03/24/2017   Procedure: TRANSESOPHAGEAL ECHOCARDIOGRAM (TEE);  Surgeon: Dusty Sudie DEL, MD;  Location: [facility] OR;  Service: Open Heart Surgery;  Laterality: N/A;   TONSILLECTOMY  1950's   TOTAL HIP ARTHROPLASTY Left 10/05/2014   dr hiram   TOTAL HIP ARTHROPLASTY Left 10/05/2014   Procedure: LEFT TOTAL HIP  ARTHROPLASTY ANTERIOR APPROACH;  Surgeon: Dempsey Moan, MD;  Location: [facility] OR;  Service: Orthopedics;  Laterality: Left;   VIDEO ASSISTED THORACOSCOPY (VATS)/WEDGE RESECTION Right 09/02/2016   Procedure: VIDEO ASSISTED THORACOSCOPY (VATS)/RIGHT UPPER LOBE WEDGE RESECTION;  Surgeon: Elspeth JAYSON Millers, MD;  Location: [facility] OR;  Service: Thoracic;  Laterality: Right;   VIDEO BRONCHOSCOPY WITH ENDOBRONCHIAL NAVIGATION N/A 10/19/2012   Procedure: VIDEO BRONCHOSCOPY WITH ENDOBRONCHIAL NAVIGATION;  Surgeon: Lamar GORMAN Chris, MD;  Location: [facility] OR;  Service: Thoracic;  Laterality: N/A;   WRIST RECONSTRUCTION Left 12/2009   'proximal row carpectomy [name]    Current Medications: Outpatient Medications Prior to Visit  Medication Sig Dispense Refill   clindamycin  (CLEOCIN  T) 1 % external solution APPLY TOPICALLY TWICE A DAY 60 mL 0   empagliflozin  (JARDIANCE ) 10 MG TABS tablet Take 1 tablet (10 mg total) by mouth daily before breakfast. 30 tablet 0   Evolocumab  (REPATHA  SURECLICK) 140 MG/ML SOAJ INJECT 140 MG INTO THE SKIN EVERY 14 (FOURTEEN) DAYS. 6 mL 3   ezetimibe  (ZETIA ) 10 MG tablet TAKE 1 TABLET BY MOUTH EVERY DAY 90 tablet 3   furosemide  (LASIX ) 40 MG tablet Take 1 tablet (40 mg total) by mouth daily. 90 tablet 3   Multiple Vitamin (MULTIVITAMIN) tablet Take 1 tablet by mouth daily.     Omega-3 Fatty Acids (FISH OIL) 500 MG CAPS Take 500 mg by mouth daily.      osimertinib  mesylate (TAGRISSO ) 80 MG tablet TAKE 1 TABLET (80 MG TOTAL) BY MOUTH DAILY. 30 tablet 2   rivaroxaban  (XARELTO ) 20 MG TABS tablet TAKE 1 TABLET BY MOUTH DAILY WITH SUPPER. 90 tablet 1   metoprolol  succinate (TOPROL -XL) 50 MG 24 hr tablet Take 1 tablet (50 mg total) by mouth in the morning and at bedtime. Take with or immediately following a meal. 180 tablet 1   No facility-administered medications prior to visit.     Allergies:   Symbicort  [budesonide -formoterol  fumarate] and Amoxicillin   Social History   Socioeconomic  History   Marital status: Married    Spouse name: Twyla   Number of children: 3   Years of education: Not on file   Highest education level: Bachelor's degree (e.g., BA, AB, BS)  Occupational History   Occupation: Retired, Multimedia Programmer   Occupation: Retired, Audiological Scientist estate    Comment: Slow  Tobacco Use   Smoking status: Never   Smokeless tobacco: Never  Vaping Use   Vaping status: Never Used  Substance and Sexual Activity   Alcohol use: Yes    Alcohol/week: 1.0 standard drink of alcohol    Types: 1 Cans of beer per week    Comment: time to time   Drug use: No   Sexual activity: Never    Partners: Female  Other Topics Concern   Not on file  Social History Narrative   HSG, [employer]. Married '73.  2 sons - '74,   '76;  1 daughter -  '77  6 grandchildren.Work - teacher, music now retired. ACP - not fully discussed.       Lives with wife-2025   Social Drivers of Health   Tobacco Use: Low Risk (06/16/2024)   Patient History    Smoking Tobacco Use: Never    Smokeless Tobacco Use: Never    Passive Exposure: Not on file  Financial Resource Strain: Low Risk (06/12/2024)   Overall Financial Resource Strain (CARDIA)    Difficulty of Paying Living Expenses: Not hard at all  Food Insecurity: No Food Insecurity (06/12/2024)   Epic    Worried About Radiation Protection Practitioner of Food in the Last Year: Never true    Ran Out of Food in the Last Year: Never true  Transportation Needs: No Transportation Needs (06/12/2024)   Epic    Lack of Transportation (Medical): No    Lack of Transportation (Non-Medical): No  Physical Activity: Insufficiently Active (06/12/2024)   Exercise Vital Sign    Days of Exercise per Week: 3 days    Minutes of Exercise per Session: 30 min  Stress: Stress Concern Present (06/12/2024)   [employer] of Occupational Health - Occupational Stress Questionnaire    Feeling of Stress: To some extent  Social Connections: Socially Isolated (06/12/2024)    Social Connection and Isolation Panel    Frequency of Communication with Friends and Family: Once a week    Frequency of Social Gatherings with Friends and Family: Once a week    Attends Religious Services: Patient declined    Active Member of Clubs or Organizations: No    Attends Engineer, Structural: Not on file    Marital Status: Married  Depression (PHQ2-9): Low Risk (04/13/2024)   Depression (PHQ2-9)    PHQ-2 Score: 1  Alcohol Screen: Low Risk (06/12/2024)   Alcohol Screen    Last Alcohol Screening Score (AUDIT): 2  Housing: Low Risk (06/12/2024)   Epic    Unable to Pay for Housing in the Last Year: No    Number of Times Moved in the Last Year: 0    Homeless in the Last Year: No  Utilities: Not At Risk (06/30/2023)   AHC Utilities    Threatened with loss of utilities: No  Health Literacy: Adequate Health Literacy (06/30/2023)   B1300 Health Literacy    Frequency of need for help with medical instructions: Never     Family History:  The patient's family history includes Cancer in his sister and sister; Diabetes in his son and son; Heart attack in his father; Heart disease in his father, paternal uncle, and sister; Heart failure in his mother; Hyperlipidemia in his mother.   ROS:   Please see the history of present illness.    ROS All other systems reviewed and are negative.   PHYSICAL EXAM:   VS:  BP 132/74 (BP Location: Left Arm, Patient Position: Sitting, Cuff  Size: Large)   Pulse (!) 106   Resp 17   Ht 5' 7 (1.702 m)   Wt 225 lb (102.1 kg)   SpO2 97%   BMI 35.24 kg/m   repeat pulse 76.  GENERAL:  Well appearing WM in NAD HEENT:  PERRL, EOMI, sclera are clear. Oropharynx is clear. NECK:  No jugular venous distention, carotid upstroke brisk and symmetric, no bruits, no thyromegaly or adenopathy LUNGS:  Clear to auscultation bilaterally CHEST:  Unremarkable HEART:  RRR,  PMI not displaced or sustained,S1 and S2 within normal limits, no S3, no S4: no clicks, no  rubs, gr 2/6 systolic murmur RUSB ABD:  Soft, nontender. BS +, no masses or bruits. No hepatomegaly, no splenomegaly EXT:  2 + pulses throughout, 1+ pitting ankle edema, chronic stasis skin changes.  SKIN:  Warm and dry.  No rashes NEURO:  Alert and oriented x 3. Cranial nerves II through XII intact. PSYCH:  Cognitively intact      Wt Readings from Last 3 Encounters:  06/16/24 225 lb (102.1 kg)  04/13/24 222 lb (100.7 kg)  03/30/24 223 lb 9.6 oz (101.4 kg)      Studies/Labs Reviewed:         Recent Labs: 12/02/2023: Magnesium  2.4; Pro B Natriuretic peptide (BNP) 64.0; TSH 1.96 04/04/2024: ALT 23; BUN 26; Creatinine 1.35; Hemoglobin 15.5; Platelet Count 135; Potassium 4.1; Sodium 142   Lipid Panel    Component Value Date/Time   CHOL 113 12/02/2023 1123   CHOL 121 03/11/2023 1404   TRIG 135.0 12/02/2023 1123   TRIG 108 05/20/2006 0825   HDL 44.90 12/02/2023 1123   HDL 56 03/11/2023 1404   CHOLHDL 3 12/02/2023 1123   VLDL 27.0 12/02/2023 1123   LDLCALC 41 12/02/2023 1123   LDLCALC 42 03/11/2023 1404   LDLCALC 136 (H) 12/26/2019 1150   LDLDIRECT 177.0 07/24/2015 1052    Additional studies/ records that were reviewed today include:    Cath 08/11/2016 Conclusion     Prox LAD to Mid LAD lesion, 30 %stenosed. Mid LAD to Dist LAD lesion, 65 %stenosed. Prox RCA to Mid RCA lesion, 15 %stenosed. The left ventricular systolic function is normal. LV end diastolic pressure is normal. The left ventricular ejection fraction is 55-65% by visual estimate. LV end diastolic pressure is normal.   1. Borderline single vessel obstructive CAD involving the mid LAD. FFR 0.81 2. Normal LV function EF 65% 3. Normal right heart and LV filling pressures 4. Normal Cardiac output   Plan: will refer to CT surgery for consideration of MV repair. The stenosis in the LAD will be discussed. He is asymptomatic and I would favor treating it medically. If he develops symptoms in the future it  could be treated with PCI.          CABG with MVR 03/24/2017 Preoperative Diagnosis:       Severe Mitral Regurgitation Single-vessel Coronary Artery Disease   Postoperative Diagnosis:    Same   Procedure:        Mitral Valve Repair             Complex valvuloplasty including artificial Gore-tex neochord placement x6             Sorin Carbomedics Annuloflex posterior annuloplasty band (size 28mm, catalog #[id number], serial [id number])   Coronary Artery Bypass Grafting x 1              Left Internal Mammary Artery to Distal Left Anterior Descending Coronary Artery  Echo 01/29/19: IMPRESSIONS     1. Left ventricular ejection fraction, by visual estimation, is 50 to  55%. The left ventricle has normal function. Normal left ventricular size.  Left ventricular septal wall thickness was mildly increased. Mildly  increased left ventricular posterior  wall thickness. There is mildly increased left ventricular hypertrophy.   2. Elevated left ventricular end-diastolic pressure.   3. Left ventricular diastolic Doppler parameters are consistent with  pseudonormalization pattern of LV diastolic filling.   4. Global right ventricle has normal systolic function.The right  ventricular size is normal. No increase in right ventricular wall  thickness.   5. Left atrial size was normal.   6. Right atrial size was normal.   7. The mitral valve is normal in structure. Mild mitral valve  regurgitation. Mild mitral stenosis.   8. The tricuspid valve is normal in structure. Tricuspid valve  regurgitation is mild.   9. The aortic valve is normal in structure. Aortic valve regurgitation  was not visualized by color flow Doppler. Structurally normal aortic  valve, with no evidence of sclerosis or stenosis.  10. Mild thickening and calcification of the aortic valve leaflets.  11. The pulmonic valve was normal in structure. Pulmonic valve  regurgitation is trivial by color flow Doppler.  12.  Normal pulmonary artery systolic pressure.  13. The inferior vena cava is normal in size with greater than 50%  respiratory variability, suggesting right atrial pressure of 3 mmHg.   Echo 09/30/23: IMPRESSIONS     1. Left ventricular ejection fraction, by estimation, is 65 to 70%. The  left ventricle has normal function. The left ventricle has no regional  wall motion abnormalities. There is mild left ventricular hypertrophy.  Left ventricular diastolic parameters  are indeterminate.   2. Right ventricular systolic function is normal. The right ventricular  size is normal.   3. The mitral valve has been repaired/replaced. Mild mitral valve  regurgitation. Moderate mitral stenosis. The mean mitral valve gradient is  9.0 mmHg with average heart rate of 87 bpm. There is a prior mitral valve  repair - complex valvuloplasty  including artificial Gore-tex neochord placement x6 and 28 mm Sorin  annuoloflex posterior annuloplasty band present in the mitral position.  Procedure Date: 03/24/2017.   4. The aortic valve is abnormal. There is severe calcifcation of the  aortic valve. Aortic valve regurgitation is trivial. Mild to moderate  aortic valve stenosis, with DVI 0.43 and SVI 28. Aortic valve area, by VTI  measures 1.35 cm. Aortic valve mean  gradient measures 12.3 mmHg. Aortic valve Vmax measures 2.35 m/s.   ASSESSMENT:    No diagnosis found.         PLAN:  In order of problems listed above:  Severe MR s/p mitral valve annuloplasty/repair:  F/U Echo 2020  shows mild MR but moderate mitral stenosis. Recommend routine SBE prophylaxis. Continue  Toprol  XL to 50 mg bid.   Will continue  lasix  to 40 mg daily but can decrease if out and about. Follow up in 6 months.   2.   History of PE now recurrent on 02/06/18. Submassive with RV strain. S/p EKOS and lytic therapy. On Xarelto  now.  Will need anticoagulation indefinitely.   3.    Chronic diastolic heart failure: Has  edema on exam.   Continue  Jardiance .  He does have venous insuffieciency. Recommend he take lasix  40 daily.  Sodium restriction. Support hose.  4.    S/p CABG x 1: LIMA to LAD.  No angina.   5.    Stage IV lung CA. S/p resection with recurrent malignant pleural effusion. On oral chemotherapy.  S/p RT for recurrent lung nodule. Stable.   6.   DM 2: Managed by primary care provider- now on Jardiance .  7.   HLD off statin due to history of elevated CK on Lipitor. On Zetia . Now on Repatha . Last LDL excellent. 41.   8. CKD stage 3a. Followed by Nephrology. Last creatinine 1.35. will monitor on increased lasix  dose  Follow up in 6 months.  Medication Adjustments/Labs and Tests Ordered: Current medicines are reviewed at length with the patient today.  Concerns regarding medicines are outlined above.  Medication changes, Labs and Tests ordered today are listed in the Patient Instructions below. There are no Patient Instructions on file for this visit.   Signed, Aedyn Mckeon, MD  06/16/2024 10:41 AM    [facility] [address] [place], [place], [place]  [address] Phone: [phone]; Fax: [phone]

## 2024-06-16 ENCOUNTER — Encounter: Payer: Self-pay | Admitting: Cardiology

## 2024-06-16 ENCOUNTER — Ambulatory Visit: Admitting: Cardiology

## 2024-06-16 VITALS — BP 132/74 | HR 106 | Resp 17 | Ht 67.0 in | Wt 225.0 lb

## 2024-06-16 DIAGNOSIS — I342 Nonrheumatic mitral (valve) stenosis: Secondary | ICD-10-CM

## 2024-06-16 DIAGNOSIS — I5032 Chronic diastolic (congestive) heart failure: Secondary | ICD-10-CM

## 2024-06-16 DIAGNOSIS — N1831 Chronic kidney disease, stage 3a: Secondary | ICD-10-CM

## 2024-06-16 DIAGNOSIS — T466X5A Adverse effect of antihyperlipidemic and antiarteriosclerotic drugs, initial encounter: Secondary | ICD-10-CM

## 2024-06-16 DIAGNOSIS — I2510 Atherosclerotic heart disease of native coronary artery without angina pectoris: Secondary | ICD-10-CM

## 2024-06-16 DIAGNOSIS — I2699 Other pulmonary embolism without acute cor pulmonale: Secondary | ICD-10-CM

## 2024-06-16 DIAGNOSIS — Z9889 Other specified postprocedural states: Secondary | ICD-10-CM

## 2024-06-16 MED ORDER — METOPROLOL SUCCINATE ER 50 MG PO TB24: 50.0000 mg | ORAL_TABLET | Freq: Every day | ORAL | 3 refills | Status: DC

## 2024-06-16 MED ORDER — METOPROLOL SUCCINATE ER 50 MG PO TB24: 50.0000 mg | ORAL_TABLET | Freq: Two times a day (BID) | ORAL | 3 refills | Status: AC

## 2024-06-16 NOTE — Patient Instructions (Signed)
 Medication Instructions:  Take Toprol  XL 50 mg twice a day Continue all medications *If you need a refill on your cardiac medications before your next appointment, please call your pharmacy*  Lab Work: None ordered  Testing/Procedures: None ordered  Follow-Up: At [facility] you and your health needs are our priority.  As part of our continuing mission to provide you with exceptional heart care, our providers are all part of one team.  This team includes your primary Cardiologist (physician) and Advanced Practice Providers or APPs (Physician Assistants and Nurse Practitioners) who all work together to provide you with the care you need, when you need it.  Your next appointment:  6 months   Call in May to schedule August appointment     Provider:  Dr.Jordan   We recommend signing up for the patient portal called MyChart.  Sign up information is provided on this After Visit Summary.  MyChart is used to connect with patients for Virtual Visits (Telemedicine).  Patients are able to view lab/test results, encounter notes, upcoming appointments, etc.  Non-urgent messages can be sent to your provider as well.   To learn more about what you can do with MyChart, go to [email or web address].

## 2024-06-30 ENCOUNTER — Ambulatory Visit: Payer: Medicare PPO

## 2024-07-05 ENCOUNTER — Encounter: Admitting: Internal Medicine

## 2024-07-13 ENCOUNTER — Inpatient Hospital Stay: Admitting: Internal Medicine

## 2024-07-13 ENCOUNTER — Inpatient Hospital Stay: Attending: Internal Medicine
